# Patient Record
Sex: Female | Born: 1960 | Race: Black or African American | Hispanic: No | Marital: Married | Smoking: Never smoker
Health system: Southern US, Community
[De-identification: ages and names within clinical notes are randomized; demographics above are authoritative.]

## PROBLEM LIST (undated history)

## (undated) DIAGNOSIS — I10 Essential (primary) hypertension: Secondary | ICD-10-CM

## (undated) DIAGNOSIS — I509 Heart failure, unspecified: Secondary | ICD-10-CM

## (undated) DIAGNOSIS — J9601 Acute respiratory failure with hypoxia: Secondary | ICD-10-CM

## (undated) DIAGNOSIS — G629 Polyneuropathy, unspecified: Secondary | ICD-10-CM

## (undated) DIAGNOSIS — E785 Hyperlipidemia, unspecified: Secondary | ICD-10-CM

## (undated) DIAGNOSIS — N186 End stage renal disease: Secondary | ICD-10-CM

## (undated) DIAGNOSIS — E1122 Type 2 diabetes mellitus with diabetic chronic kidney disease: Secondary | ICD-10-CM

## (undated) DIAGNOSIS — I469 Cardiac arrest, cause unspecified: Secondary | ICD-10-CM

## (undated) HISTORY — DX: Hyperlipidemia, unspecified: E78.5

## (undated) HISTORY — PX: EYE SURGERY: SHX253

## (undated) HISTORY — DX: Acute respiratory failure with hypoxia: J96.01

## (undated) HISTORY — DX: Essential (primary) hypertension: I10

## (undated) NOTE — *Deleted (*Deleted)
FPTS Interim Progress Note  Patient name: Rachel Chaney Medical record number: [id number] Date of birth: 27-Jul-1960 Age: 68 y.o. Gender: female   S: Patient doing well. Currently receiving dialysis. Has no complaints at this time.  Overnight patient had some delirium and required Mitt placement per nurse.    O: BP (!) 135/46   Pulse 84   Temp (!) 97.5 F (36.4 C) (Oral)   Resp 14   Ht 5\' 4"  (1.626 m)   Wt 104.1 kg   LMP 09/18/2011   SpO2 100%   BMI 39.39 kg/m    Physical Exam Constitutional:      General: She is not in acute distress.    Appearance: Normal appearance. She is not ill-appearing.  HENT:     Head: Normocephalic and atraumatic.     Mouth/Throat:     Mouth: Mucous membranes are moist.  Cardiovascular:     Rate and Rhythm: Normal rate and regular rhythm.     Pulses: Normal pulses.     Heart sounds: Normal heart sounds.  Pulmonary:     Effort: Pulmonary effort is normal.     Breath sounds: Normal breath sounds.  Abdominal:     General: Abdomen is flat. There is no distension.     Palpations: Abdomen is soft.     Tenderness: There is no abdominal tenderness.  Skin:    General: Skin is warm.     Capillary Refill: Capillary refill takes less than 2 seconds.  Neurological:     Mental Status: She is alert and oriented to person, place, and time.  Psychiatric:        Mood and Affect: Mood normal.        Behavior: Behavior normal.     A/P:     Patient currently saturating well on 3L which she uses PRN at home.  No increased WOB.  O@ Sats> 95%.  Currnelty Aand O x4 and Delirium has resolved.  Patient currently clinically stable on PE.  Taking over patient from CCM this morning. - Patient seen by hospitalist as well, spoke with on phone and informed she was our patient and we are happy to continue seeing her, indicated hw would write note on her today, transferred Attending to Dr Ardelia Mems - MRI today per Neuro - Continue to hold Levemir, consider restarting  if BG's significantly increase - Continue sSSI - Continue 3L O2 - Continue to monitor vitals, Pulse o2 and mental status  Delora Fuel, MD 11/14/2019, 9:52 AM PGY-1, [facility] pager: [phone], text pages welcome

---

## 1997-04-14 ENCOUNTER — Encounter: Admission: RE | Admit: 1997-04-14 | Discharge: 1997-04-14 | Payer: Self-pay | Admitting: Sports Medicine

## 1997-04-16 ENCOUNTER — Encounter: Admission: RE | Admit: 1997-04-16 | Discharge: 1997-04-16 | Payer: Self-pay | Admitting: Family Medicine

## 1997-04-28 ENCOUNTER — Encounter: Admission: RE | Admit: 1997-04-28 | Discharge: 1997-04-28 | Payer: Self-pay | Admitting: Family Medicine

## 1997-09-29 ENCOUNTER — Encounter: Admission: RE | Admit: 1997-09-29 | Discharge: 1997-09-29 | Payer: Self-pay | Admitting: Family Medicine

## 1998-02-24 ENCOUNTER — Encounter: Admission: RE | Admit: 1998-02-24 | Discharge: 1998-02-24 | Payer: Self-pay | Admitting: Family Medicine

## 1998-09-07 ENCOUNTER — Encounter: Admission: RE | Admit: 1998-09-07 | Discharge: 1998-09-07 | Payer: Self-pay | Admitting: Sports Medicine

## 1999-02-08 ENCOUNTER — Encounter: Admission: RE | Admit: 1999-02-08 | Discharge: 1999-02-08 | Payer: Self-pay | Admitting: Sports Medicine

## 1999-02-08 ENCOUNTER — Ambulatory Visit (HOSPITAL_COMMUNITY): Admission: RE | Admit: 1999-02-08 | Discharge: 1999-02-08 | Payer: Self-pay | Admitting: Sports Medicine

## 1999-02-25 ENCOUNTER — Encounter: Admission: RE | Admit: 1999-02-25 | Discharge: 1999-02-25 | Payer: Self-pay | Admitting: Family Medicine

## 1999-07-25 ENCOUNTER — Encounter: Admission: RE | Admit: 1999-07-25 | Discharge: 1999-07-25 | Payer: Self-pay | Admitting: Family Medicine

## 1999-09-21 ENCOUNTER — Encounter: Admission: RE | Admit: 1999-09-21 | Discharge: 1999-09-21 | Payer: Self-pay | Admitting: Family Medicine

## 2000-02-20 ENCOUNTER — Encounter: Admission: RE | Admit: 2000-02-20 | Discharge: 2000-02-20 | Payer: Self-pay | Admitting: Sports Medicine

## 2000-07-26 ENCOUNTER — Encounter: Admission: RE | Admit: 2000-07-26 | Discharge: 2000-07-26 | Payer: Self-pay | Admitting: Family Medicine

## 2001-02-08 ENCOUNTER — Encounter: Admission: RE | Admit: 2001-02-08 | Discharge: 2001-02-08 | Payer: Self-pay | Admitting: Family Medicine

## 2001-06-27 ENCOUNTER — Encounter: Admission: RE | Admit: 2001-06-27 | Discharge: 2001-06-27 | Payer: Self-pay | Admitting: Family Medicine

## 2001-11-04 ENCOUNTER — Encounter: Admission: RE | Admit: 2001-11-04 | Discharge: 2001-11-04 | Payer: Self-pay | Admitting: Sports Medicine

## 2001-11-06 ENCOUNTER — Encounter: Admission: RE | Admit: 2001-11-06 | Discharge: 2001-11-06 | Payer: Self-pay | Admitting: Family Medicine

## 2002-04-04 ENCOUNTER — Encounter: Admission: RE | Admit: 2002-04-04 | Discharge: 2002-04-04 | Payer: Self-pay | Admitting: Family Medicine

## 2002-09-05 ENCOUNTER — Encounter: Admission: RE | Admit: 2002-09-05 | Discharge: 2002-09-05 | Payer: Self-pay | Admitting: Family Medicine

## 2003-03-09 ENCOUNTER — Encounter: Admission: RE | Admit: 2003-03-09 | Discharge: 2003-03-09 | Payer: Self-pay | Admitting: Family Medicine

## 2003-03-10 ENCOUNTER — Encounter: Admission: RE | Admit: 2003-03-10 | Discharge: 2003-03-10 | Payer: Self-pay | Admitting: Family Medicine

## 2003-08-31 ENCOUNTER — Encounter: Admission: RE | Admit: 2003-08-31 | Discharge: 2003-08-31 | Payer: Self-pay | Admitting: Family Medicine

## 2004-01-10 ENCOUNTER — Encounter (INDEPENDENT_AMBULATORY_CARE_PROVIDER_SITE_OTHER): Payer: Self-pay | Admitting: *Deleted

## 2004-01-10 LAB — CONVERTED CEMR LAB

## 2004-02-03 ENCOUNTER — Ambulatory Visit: Payer: Self-pay | Admitting: Family Medicine

## 2004-06-27 ENCOUNTER — Ambulatory Visit: Payer: Self-pay | Admitting: Family Medicine

## 2004-11-25 ENCOUNTER — Ambulatory Visit: Payer: Self-pay | Admitting: Family Medicine

## 2005-04-26 ENCOUNTER — Ambulatory Visit: Payer: Self-pay | Admitting: Family Medicine

## 2005-09-26 ENCOUNTER — Ambulatory Visit: Payer: Self-pay | Admitting: Sports Medicine

## 2006-02-28 ENCOUNTER — Encounter (INDEPENDENT_AMBULATORY_CARE_PROVIDER_SITE_OTHER): Payer: Self-pay | Admitting: Specialist

## 2006-02-28 ENCOUNTER — Ambulatory Visit: Payer: Self-pay | Admitting: Family Medicine

## 2006-02-28 ENCOUNTER — Encounter (INDEPENDENT_AMBULATORY_CARE_PROVIDER_SITE_OTHER): Payer: Self-pay | Admitting: Family Medicine

## 2006-02-28 LAB — CONVERTED CEMR LAB: Pap Smear: NORMAL

## 2006-03-08 ENCOUNTER — Encounter: Payer: Self-pay | Admitting: Family Medicine

## 2006-03-08 ENCOUNTER — Ambulatory Visit: Payer: Self-pay | Admitting: Family Medicine

## 2006-03-08 DIAGNOSIS — I10 Essential (primary) hypertension: Secondary | ICD-10-CM | POA: Insufficient documentation

## 2006-03-08 DIAGNOSIS — E785 Hyperlipidemia, unspecified: Secondary | ICD-10-CM | POA: Insufficient documentation

## 2006-03-08 DIAGNOSIS — J309 Allergic rhinitis, unspecified: Secondary | ICD-10-CM | POA: Insufficient documentation

## 2006-03-08 LAB — CONVERTED CEMR LAB
BUN: 14 mg/dL (ref 6–23)
CO2: 27 meq/L (ref 19–32)
Calcium: 9.2 mg/dL (ref 8.4–10.5)
Chloride: 100 meq/L (ref 96–112)
Creatinine, Ser: 0.53 mg/dL (ref 0.40–1.20)
Glucose, Bld: 313 mg/dL — ABNORMAL HIGH (ref 70–99)
Potassium: 4 meq/L (ref 3.5–5.3)
Sodium: 139 meq/L (ref 135–145)

## 2006-03-09 ENCOUNTER — Encounter (INDEPENDENT_AMBULATORY_CARE_PROVIDER_SITE_OTHER): Payer: Self-pay | Admitting: *Deleted

## 2006-04-02 ENCOUNTER — Ambulatory Visit: Payer: Self-pay | Admitting: Family Medicine

## 2006-04-02 ENCOUNTER — Encounter: Payer: Self-pay | Admitting: Family Medicine

## 2006-04-02 DIAGNOSIS — I10 Essential (primary) hypertension: Secondary | ICD-10-CM | POA: Insufficient documentation

## 2006-04-02 LAB — CONVERTED CEMR LAB
Cholesterol: 178 mg/dL (ref 0–200)
HDL: 48 mg/dL (ref >39)
LDL Cholesterol: 97 mg/dL (ref 0–99)
Total CHOL/HDL Ratio: 3.7
Triglycerides: 166 mg/dL — ABNORMAL HIGH (ref <150)
VLDL: 33 mg/dL (ref 0–40)

## 2006-05-30 ENCOUNTER — Telehealth: Payer: Self-pay | Admitting: Family Medicine

## 2006-06-05 ENCOUNTER — Telehealth (INDEPENDENT_AMBULATORY_CARE_PROVIDER_SITE_OTHER): Payer: Self-pay | Admitting: Family Medicine

## 2006-06-08 ENCOUNTER — Telehealth: Payer: Self-pay | Admitting: *Deleted

## 2006-07-02 ENCOUNTER — Ambulatory Visit: Payer: Self-pay | Admitting: Sports Medicine

## 2006-07-02 LAB — CONVERTED CEMR LAB
Hgb A1c MFr Bld: 10.8 %
Microalbumin U total vol: POSITIVE mg/L

## 2006-08-01 ENCOUNTER — Telehealth: Payer: Self-pay | Admitting: *Deleted

## 2006-08-01 ENCOUNTER — Encounter: Payer: Self-pay | Admitting: *Deleted

## 2006-09-05 ENCOUNTER — Telehealth (INDEPENDENT_AMBULATORY_CARE_PROVIDER_SITE_OTHER): Payer: Self-pay | Admitting: Family Medicine

## 2006-10-23 ENCOUNTER — Telehealth (INDEPENDENT_AMBULATORY_CARE_PROVIDER_SITE_OTHER): Payer: Self-pay | Admitting: Family Medicine

## 2006-10-23 ENCOUNTER — Encounter (INDEPENDENT_AMBULATORY_CARE_PROVIDER_SITE_OTHER): Payer: Self-pay | Admitting: Family Medicine

## 2006-11-06 ENCOUNTER — Telehealth (INDEPENDENT_AMBULATORY_CARE_PROVIDER_SITE_OTHER): Payer: Self-pay | Admitting: Family Medicine

## 2006-11-16 ENCOUNTER — Ambulatory Visit: Payer: Self-pay | Admitting: Family Medicine

## 2006-11-16 DIAGNOSIS — IMO0002 Reserved for concepts with insufficient information to code with codable children: Secondary | ICD-10-CM | POA: Insufficient documentation

## 2006-11-16 LAB — CONVERTED CEMR LAB: Hgb A1c MFr Bld: 10.8 %

## 2006-12-18 ENCOUNTER — Telehealth: Payer: Self-pay | Admitting: *Deleted

## 2007-03-05 ENCOUNTER — Telehealth: Payer: Self-pay | Admitting: *Deleted

## 2007-04-17 ENCOUNTER — Encounter (INDEPENDENT_AMBULATORY_CARE_PROVIDER_SITE_OTHER): Payer: Self-pay | Admitting: Family Medicine

## 2007-04-17 ENCOUNTER — Ambulatory Visit: Payer: Self-pay | Admitting: Family Medicine

## 2007-04-17 LAB — CONVERTED CEMR LAB
BUN: 13 mg/dL (ref 6–23)
CO2: 22 meq/L (ref 19–32)
Calcium: 9.1 mg/dL (ref 8.4–10.5)
Chloride: 105 meq/L (ref 96–112)
Creatinine, Ser: 0.53 mg/dL (ref 0.40–1.20)
Glucose, Bld: 237 mg/dL — ABNORMAL HIGH (ref 70–99)
Hgb A1c MFr Bld: 11.6 %
Potassium: 3.8 meq/L (ref 3.5–5.3)
Sodium: 140 meq/L (ref 135–145)

## 2007-04-19 ENCOUNTER — Encounter (INDEPENDENT_AMBULATORY_CARE_PROVIDER_SITE_OTHER): Payer: Self-pay | Admitting: Family Medicine

## 2007-07-09 ENCOUNTER — Ambulatory Visit: Payer: Self-pay | Admitting: Family Medicine

## 2007-07-09 ENCOUNTER — Encounter (INDEPENDENT_AMBULATORY_CARE_PROVIDER_SITE_OTHER): Payer: Self-pay | Admitting: Family Medicine

## 2007-07-09 DIAGNOSIS — N898 Other specified noninflammatory disorders of vagina: Secondary | ICD-10-CM | POA: Insufficient documentation

## 2007-07-09 LAB — CONVERTED CEMR LAB
Beta hcg, urine, semiquantitative: NEGATIVE
Hgb A1c MFr Bld: 11.5 %

## 2007-07-11 LAB — CONVERTED CEMR LAB: FSH: 17 milliintl units/mL

## 2007-07-24 ENCOUNTER — Telehealth: Payer: Self-pay | Admitting: *Deleted

## 2007-08-28 ENCOUNTER — Encounter (INDEPENDENT_AMBULATORY_CARE_PROVIDER_SITE_OTHER): Payer: Self-pay | Admitting: Family Medicine

## 2007-10-15 ENCOUNTER — Telehealth: Payer: Self-pay | Admitting: *Deleted

## 2007-11-11 ENCOUNTER — Ambulatory Visit: Payer: Self-pay | Admitting: Family Medicine

## 2007-11-11 ENCOUNTER — Encounter (INDEPENDENT_AMBULATORY_CARE_PROVIDER_SITE_OTHER): Payer: Self-pay | Admitting: Family Medicine

## 2007-11-11 LAB — CONVERTED CEMR LAB
Beta hcg, urine, semiquantitative: NEGATIVE
Hgb A1c MFr Bld: 11.8 %

## 2007-11-20 ENCOUNTER — Encounter (INDEPENDENT_AMBULATORY_CARE_PROVIDER_SITE_OTHER): Payer: Self-pay | Admitting: Family Medicine

## 2007-11-20 LAB — CONVERTED CEMR LAB
ALT: 11 units/L (ref 0–35)
AST: 11 units/L (ref 0–37)
Albumin: 3.6 g/dL (ref 3.5–5.2)
Alkaline Phosphatase: 78 units/L (ref 39–117)
BUN: 13 mg/dL (ref 6–23)
CO2: 22 meq/L (ref 19–32)
Calcium: 9 mg/dL (ref 8.4–10.5)
Chloride: 103 meq/L (ref 96–112)
Cholesterol: 192 mg/dL (ref 0–200)
Creatinine, Ser: 0.48 mg/dL (ref 0.40–1.20)
Glucose, Bld: 239 mg/dL — ABNORMAL HIGH (ref 70–99)
HDL: 41 mg/dL (ref >39)
LDL Cholesterol: 125 mg/dL — ABNORMAL HIGH (ref 0–99)
Potassium: 3.6 meq/L (ref 3.5–5.3)
Sodium: 137 meq/L (ref 135–145)
TSH: 0.913 microintl units/mL (ref 0.350–4.50)
Total Bilirubin: 0.5 mg/dL (ref 0.3–1.2)
Total CHOL/HDL Ratio: 4.7
Total Protein: 6.4 g/dL (ref 6.0–8.3)
Triglycerides: 131 mg/dL (ref <150)
VLDL: 26 mg/dL (ref 0–40)

## 2007-11-21 ENCOUNTER — Telehealth (INDEPENDENT_AMBULATORY_CARE_PROVIDER_SITE_OTHER): Payer: Self-pay | Admitting: Family Medicine

## 2007-12-17 ENCOUNTER — Telehealth: Payer: Self-pay | Admitting: *Deleted

## 2007-12-25 ENCOUNTER — Telehealth: Payer: Self-pay | Admitting: *Deleted

## 2008-01-17 ENCOUNTER — Telehealth: Payer: Self-pay | Admitting: *Deleted

## 2008-01-30 ENCOUNTER — Ambulatory Visit: Payer: Self-pay | Admitting: Family Medicine

## 2008-01-31 ENCOUNTER — Ambulatory Visit: Payer: Self-pay | Admitting: Family Medicine

## 2008-02-03 ENCOUNTER — Ambulatory Visit: Payer: Self-pay | Admitting: Family Medicine

## 2008-03-25 ENCOUNTER — Telehealth (INDEPENDENT_AMBULATORY_CARE_PROVIDER_SITE_OTHER): Payer: Self-pay | Admitting: Family Medicine

## 2008-05-13 ENCOUNTER — Telehealth: Payer: Self-pay | Admitting: *Deleted

## 2008-05-26 ENCOUNTER — Ambulatory Visit: Payer: Self-pay | Admitting: Family Medicine

## 2008-05-26 ENCOUNTER — Encounter (INDEPENDENT_AMBULATORY_CARE_PROVIDER_SITE_OTHER): Payer: Self-pay | Admitting: Family Medicine

## 2008-05-26 LAB — CONVERTED CEMR LAB
Hgb A1c MFr Bld: 12.8 %
Pap Smear: NORMAL

## 2008-05-27 LAB — CONVERTED CEMR LAB: Direct LDL: 125 mg/dL — ABNORMAL HIGH

## 2008-05-28 ENCOUNTER — Encounter (INDEPENDENT_AMBULATORY_CARE_PROVIDER_SITE_OTHER): Payer: Self-pay | Admitting: Family Medicine

## 2008-05-29 ENCOUNTER — Ambulatory Visit (HOSPITAL_COMMUNITY): Admission: RE | Admit: 2008-05-29 | Discharge: 2008-05-29 | Payer: Self-pay | Admitting: Family Medicine

## 2008-10-05 ENCOUNTER — Ambulatory Visit: Payer: Self-pay | Admitting: Family Medicine

## 2008-10-05 LAB — CONVERTED CEMR LAB: Hgb A1c MFr Bld: 10.9 %

## 2008-10-16 ENCOUNTER — Telehealth: Payer: Self-pay | Admitting: Family Medicine

## 2008-11-20 ENCOUNTER — Ambulatory Visit: Payer: Self-pay | Admitting: Family Medicine

## 2009-01-13 ENCOUNTER — Telehealth: Payer: Self-pay | Admitting: Family Medicine

## 2009-03-01 ENCOUNTER — Telehealth (INDEPENDENT_AMBULATORY_CARE_PROVIDER_SITE_OTHER): Payer: Self-pay | Admitting: Pharmacist

## 2009-04-22 ENCOUNTER — Encounter: Payer: Self-pay | Admitting: Family Medicine

## 2009-04-22 ENCOUNTER — Ambulatory Visit: Payer: Self-pay | Admitting: Family Medicine

## 2009-04-22 DIAGNOSIS — N924 Excessive bleeding in the premenopausal period: Secondary | ICD-10-CM | POA: Insufficient documentation

## 2009-04-22 DIAGNOSIS — L293 Anogenital pruritus, unspecified: Secondary | ICD-10-CM | POA: Insufficient documentation

## 2009-04-22 LAB — CONVERTED CEMR LAB
ALT: 12 units/L (ref 0–35)
AST: 13 units/L (ref 0–37)
Albumin: 3.6 g/dL (ref 3.5–5.2)
Alkaline Phosphatase: 87 units/L (ref 39–117)
BUN: 9 mg/dL (ref 6–23)
Beta hcg, urine, semiquantitative: NEGATIVE
CO2: 23 meq/L (ref 19–32)
Calcium: 9.2 mg/dL (ref 8.4–10.5)
Chloride: 104 meq/L (ref 96–112)
Creatinine, Ser: 0.56 mg/dL (ref 0.40–1.20)
Glucose, Bld: 197 mg/dL — ABNORMAL HIGH (ref 70–99)
Hgb A1c MFr Bld: 11 %
Potassium: 3.7 meq/L (ref 3.5–5.3)
Sodium: 138 meq/L (ref 135–145)
Total Bilirubin: 0.3 mg/dL (ref 0.3–1.2)
Total Protein: 6.5 g/dL (ref 6.0–8.3)
Whiff Test: NEGATIVE

## 2009-04-26 ENCOUNTER — Encounter: Payer: Self-pay | Admitting: Family Medicine

## 2009-05-03 ENCOUNTER — Telehealth (INDEPENDENT_AMBULATORY_CARE_PROVIDER_SITE_OTHER): Payer: Self-pay | Admitting: *Deleted

## 2009-05-17 ENCOUNTER — Telehealth: Payer: Self-pay | Admitting: Family Medicine

## 2009-05-21 ENCOUNTER — Ambulatory Visit: Payer: Self-pay | Admitting: Family Medicine

## 2009-05-21 ENCOUNTER — Telehealth: Payer: Self-pay | Admitting: Family Medicine

## 2009-05-21 ENCOUNTER — Encounter: Payer: Self-pay | Admitting: Family Medicine

## 2009-05-21 DIAGNOSIS — N764 Abscess of vulva: Secondary | ICD-10-CM | POA: Insufficient documentation

## 2009-06-10 ENCOUNTER — Telehealth: Payer: Self-pay | Admitting: Family Medicine

## 2009-09-20 ENCOUNTER — Telehealth: Payer: Self-pay | Admitting: *Deleted

## 2009-10-11 ENCOUNTER — Ambulatory Visit: Payer: Self-pay | Admitting: Family Medicine

## 2009-10-11 LAB — CONVERTED CEMR LAB
Hgb A1c MFr Bld: 11.6 %
Whiff Test: NEGATIVE

## 2010-02-08 NOTE — Progress Notes (Signed)
Summary: rx req  Phone Note Call from Patient Call back at Home Phone [phone] Call back at Work Phone [phone]   Caller: Patient Summary of Call: wants to know if she can get one sample of insulin pen until she can get some medicine. Initial call taken by: Raymond Gurney,  May 13, 2008 8:49 AM  Follow-up for Phone Call        will give one sample. Lantus Solostar [employer] lot  # [id number]  exp date 08/2010. patient notified to pick up. Follow-up by: Marcell Barlow RN,  May 13, 2008 9:10 AM

## 2010-02-08 NOTE — Assessment & Plan Note (Signed)
Summary: TB ,df  Nurse Visit    Prior Medications: BAYER CHILDRENS ASPIRIN 81 MG CHEW (ASPIRIN) Take 1 tablet by mouth once a day GLUCOTROL XL 10 MG TB24 (GLIPIZIDE) Take 1 tablet by mouth twice a day ONETOUCH ULTRA TEST  STRP (GLUCOSE BLOOD) 1 strip every morning ZYRTEC ALLERGY 10 MG TABS (CETIRIZINE HCL) 1 tablet by mouth at bedtime as needed - not taking much METOPROLOL TARTRATE 100 MG  TABS (METOPROLOL TARTRATE) Take one tablet two times a day VASOTEC 20 MG  TABS (ENALAPRIL MALEATE) Take 2 tablets once a day HYDROCHLOROTHIAZIDE 25 MG  TABS (HYDROCHLOROTHIAZIDE) Take one tablet daily. NORVASC 10 MG  TABS (AMLODIPINE BESYLATE) Take one tablet daily NEURONTIN 300 MG  CAPS (GABAPENTIN) Take one tablet by mouth at bedtime for feet pain Current Allergies: No known allergies    PPD Application    Vaccine Type: PPD    Site: left forearm    Mfr: [place]    Dose: 0.1 ml    Route: ID    Given by: AMY MARTIN RN    Exp. Date: 12/26/2009    Lot #: [id number]   Orders Added: 1)  TB Skin Test [86580] 2)  Est Level 1- [facility] [id number]    ]

## 2010-02-08 NOTE — Progress Notes (Signed)
Summary: refill  Phone Note Call from Patient Call back at Home Phone [phone]   Refills Requested: Medication #1:  ONETOUCH ULTRA TEST  STRP 1 strip every morning Caller: Patient Summary of Call: needs ultra touch pen needles ultra touch test strips [facility] [address] Initial call taken by: Audie Clear,  March 25, 2008 9:01 AM  Follow-up for Phone Call        will forward message to MD. Follow-up by: Marcell Barlow RN,  March 25, 2008 9:02 AM  Additional Follow-up for Phone Call Additional follow up Details #1::        Refilled.  Additional Follow-up by: Danise Mina MD,  March 26, 2008 11:24 AM    New/Updated Medications: * ULTRA TOUCH PEN NEEDLES DM: 250.00 Check sugar daily in AM.  Dispense one month supply   Prescriptions: ULTRA TOUCH PEN NEEDLES DM: 250.00 Check sugar daily in AM.  Dispense one month supply  #1 x 11   Entered and Authorized by:   Danise Mina MD   Signed by:   Danise Mina MD on 03/26/2008   Method used:   Faxed to ...       [facility] [phone]* (retail)       [place], [place]  [address]       Ph: [phone]       Fax: [phone]   RxID:   [id number] Donald Siva TEST  STRP (GLUCOSE BLOOD) 1 strip every morning  #100 x prn   Entered and Authorized by:   Danise Mina MD   Signed by:   Danise Mina MD on 03/26/2008   Method used:   Electronically to        [facility] [phone]* (retail)       [address] [place], [place]  [address]       Ph: [phone]       Fax: [phone]   RxID:   [id number]

## 2010-02-08 NOTE — Letter (Signed)
Summary: Generic Letter  [facility]  [address]   [place], [place] [address]   Phone: [phone]  Fax: [phone]    05/28/2008  Rachel Chaney [address] [place], [place]  [address]  Dear Ms. Eakins,  Your pap smear done 05/26/08 was normal. You are not due for another pap smear for 3 years. Please call our office if you have any questions.   Sincerely,   Danise Mina MD  Appended Document: Generic Letter mailed

## 2010-02-08 NOTE — Assessment & Plan Note (Signed)
Summary: dm wp   Vital Signs:  Patient Profile:   50 Years Old Female Height:     64 inches Weight:      193.8 pounds BMI:     33.39 Temp:     99 degrees F Pulse rate:   95 / minute BP sitting:   168 / 82  Pt. in pain?   no  Vitals Entered By: Elige Radon RN (November 16, 2006 1:41 PM)              Is Patient Diabetic? Yes      PCP:  Randa Spike MD  Chief Complaint:  diabetes check.  History of Present Illness: 50 yo with HTN, DM, obesity here for routine follow up.  1. DM - Denies hypoglycemic episodes. Taking Lantus 40 units q hs and Glucotrol XL 10 mg daily and says she does not miss doses. Fasting blood sugars are 150-160s. Two hour postprandials are 170-180s. No polyphagia, polydipsia, polyuria. Saw eye doctor in April.   2. HTN - No CP,SOB, vison changes, HAs, edema. Says she has been compliant with her meds.  3. Obesity - Not exercising. She is going to try to walk 2 times a week. Has  been trying to eat baked foods and veggies and fruits. was seeing Dr. Jenne Campus but not now.   4. Pain in legs and back - Seemed to get worse 3 weeks ago when started having to move around more at work.  Sometimes has numbness in feet and hands but none right now. This is mainly in the mornings. Has seen a food doctor for this.   Current Allergies (reviewed today): No known allergies   Past Medical History:    Diabetes    HTN    Obesity  Past Surgical History:    No surgeries   Family History:    Diabetes 1st degree - father and grandfather    No colon cancer or female cancer.    No CAD or CVAs.   Social History:    Has never smokes, No ETOH, Drug.      Physical Exam  General:     alert, well-developed, and well-nourished.   Head:     normocephalic and atraumatic.   Neck:     No carotid bruits. Lungs:     normal respiratory effort, normal breath sounds, no crackles, and no wheezes.   Heart:     normal rate, regular rhythm, no murmur, no gallop, and  no rub.   Abdomen:     soft, non-tender, normal bowel sounds, no distention, no masses, no guarding, no hepatomegaly, and no splenomegaly.  obese. Msk:     Tender in lumbar paraspinus region but no tenderness over spine.  Pulses:     R and L dorsalis pedis normal.   Extremities:     no edema. Normal sensation feet and legs.  Neurologic:     sensation intact to light touch.   Psych:     normally interactive.      Impression & Recommendations:  Problem # 1:  DM W/MANIFESTATION NEC, TYPE II, UNCONTROLLED (ICD-250.82) Assessment: Unchanged Increase Lantus to 42 units and will have patient titrate up on her own to keep fasting sugars <120. Samples of solostar pen given x 2. [id number], Exp 2/11. Continue Glucotrol XL. RTC in 1 month. AIC is 10.8.    The following medications were removed from the medication list:    Enalapril-hydrochlorothiazide 10-25 Mg Tabs (Enalapril-hydrochlorothiazide) .Marland Kitchen... Take 2 tablet by  mouth once a day  Her updated medication list for this problem includes:    Bayer Childrens Aspirin 81 Mg Chew (Aspirin) .Marland Kitchen... Take 1 tablet by mouth once a day    Glucotrol Xl 10 Mg Tb24 (Glipizide) .Marland Kitchen... Take 1 tablet by mouth twice a day    Vasotec 20 Mg Tabs (Enalapril maleate) .Marland Kitchen... Take 2 tablets once a day    Lantus Solostar 100 Unit/ml Soln (Insulin glargine) ..... Inject 42 units daily and increase by 1 unit every 2 days as long as your fasting blood sugar is greater than 120. disp 1 month supply  Orders: [facility]- Est  Level 4 [id number])   Problem # 2:  HYPERTENSION, BENIGN SYSTEMIC (ICD-401.1) Assessment: Unchanged Blood pressure still elevated. Will stop her combination medication and have her double the Enalapril component to 40 mg daily and have her only take HCTZ 25 mg daily. She will need to have her electrolytes checked in 1-2 weeks since we are increasing her enalapril.   The following medications were removed from the medication list:     Enalapril-hydrochlorothiazide 10-25 Mg Tabs (Enalapril-hydrochlorothiazide) .Marland Kitchen... Take 2 tablet by mouth once a day  Her updated medication list for this problem includes:    Metoprolol Tartrate 100 Mg Tabs (Metoprolol tartrate) .Marland Kitchen... Take one tablet two times a day    Vasotec 20 Mg Tabs (Enalapril maleate) .Marland Kitchen... Take 2 tablets once a day    Hydrochlorothiazide 25 Mg Tabs (Hydrochlorothiazide) .Marland Kitchen... Take one tablet daily.  Orders: [facility]- Est  Level 4 [id number])  Future Orders: [facility] [id number]) ... 12/07/2006   Problem # 3:  OBESITY, NOS (ICD-278.00) Assessment: Deteriorated Weight gain of 2-3 lbs. Advised weight loss, exercise, and diet again.  Orders: [facility]- Est  Level 4 [id number])   Problem # 4:  BACK STRAIN ([id number]) Assessment: New Advised her to use Ibuprofen and ice packs and see if the pain goes away in the next few weeks. Likely is a muscle strain from her new job.  Orders: [facility]- Est  Level 4 [id number])   Complete Medication List: 1)  Bayer Childrens Aspirin 81 Mg Chew (Aspirin) .... Take 1 tablet by mouth once a day 2)  Enpresse-28 Tabs (Levonorg-eth estrad triphasic) .... Take 1 tablet by mouth as directed 3)  Glucotrol Xl 10 Mg Tb24 (Glipizide) .... Take 1 tablet by mouth twice a day 4)  Onetouch Ultra Test Strp (Glucose blood) .Marland Kitchen.. 1 strip every morning 5)  Zyrtec Allergy 10 Mg Tabs (Cetirizine hcl) .Marland Kitchen.. 1 tablet by mouth at bedtime as needed - not taking much 6)  Metoprolol Tartrate 100 Mg Tabs (Metoprolol tartrate) .... Take one tablet two times a day 7)  Vasotec 20 Mg Tabs (Enalapril maleate) .... Take 2 tablets once a day 8)  Hydrochlorothiazide 25 Mg Tabs (Hydrochlorothiazide) .... Take one tablet daily. 9)  Lantus Solostar 100 Unit/ml Soln (Insulin glargine) .... Inject 42 units daily and increase by 1 unit every 2 days as long as your fasting blood sugar is greater than 120. disp 1 month supply  Other Orders: A1C-[facility] [id number])   Patient Instructions: 1)  We  will check your electrolytes and kidneys in 1-2 weeks because we are increasing your Enalapril.  2)  Increase your Lantus to 42 units daily. Then increase every 2 days by 1 unit as long as your fasting blood sugars are >120. 3)  I am going to increase your enalapril and decrease your HCTZ for your blood pressure. 4)  You will be taking Enalapril  20 mg two tablets daily and HCTZ 25 mg daily. 5)  Try to exercise 2-3 times a week. Continue eating fruits and vegetables and avoiding fried foods and sweets. 6)  Please schedule a follow-up appointment in 1 month.    Prescriptions: LANTUS SOLOSTAR 100 UNIT/ML  SOLN (INSULIN GLARGINE) Inject 42 units daily and increase by 1 unit every 2 days as long as your fasting blood sugar is greater than 120. Disp 1 month supply  #1 x 1   Entered and Authorized by:   Rolly Salter MD   Signed by:   Rolly Salter MD on 11/16/2006   Method used:   Electronically sent to ...       [facility]       [place], [place]  [address]       Ph: [phone]       Fax: [phone]   RxID:   [id number] HYDROCHLOROTHIAZIDE 25 MG  TABS (HYDROCHLOROTHIAZIDE) Take one tablet daily.  #30 x 1   Entered and Authorized by:   Rolly Salter MD   Signed by:   Rolly Salter MD on 11/16/2006   Method used:   Electronically sent to ...       [place], [place]  [address]       Ph: [phone]       Fax: [phone]   RxID:   [id number] VASOTEC 20 MG  TABS (ENALAPRIL MALEATE) Take 2 tablets once a day  #60 x 1   Entered and Authorized by:   Rolly Salter MD   Signed by:   Rolly Salter MD on 11/16/2006   Method used:   Electronically sent to ...       [facility]       [place], [place]  [address]       Ph: [phone]       Fax: [phone]   RxID:   [id number]  ]  Laboratory Results   Blood Tests   Date/Time Received: November 16, 2006 1:47 PM  Date/Time Reported: November 16, 2006 2:00 PM   HGBA1C: 10.8%   (Normal Range: Non-Diabetic - 3-6%   Control Diabetic - 6-8%)  Comments: ...................................................................DONNA LORING  November 16, 2006 2:00 PM

## 2010-02-08 NOTE — Assessment & Plan Note (Signed)
Summary: READ TB/EO  Nurse Visit    Prior Medications: BAYER CHILDRENS ASPIRIN 81 MG CHEW (ASPIRIN) Take 1 tablet by mouth once a day GLUCOTROL XL 10 MG TB24 (GLIPIZIDE) Take 1 tablet by mouth twice a day ONETOUCH ULTRA TEST  STRP (GLUCOSE BLOOD) 1 strip every morning ZYRTEC ALLERGY 10 MG TABS (CETIRIZINE HCL) 1 tablet by mouth at bedtime as needed - not taking much METOPROLOL TARTRATE 100 MG  TABS (METOPROLOL TARTRATE) Take one tablet two times a day VASOTEC 20 MG  TABS (ENALAPRIL MALEATE) Take 2 tablets once a day HYDROCHLOROTHIAZIDE 25 MG  TABS (HYDROCHLOROTHIAZIDE) Take one tablet daily. LANTUS SOLOSTAR 100 UNIT/ML  SOLN (INSULIN GLARGINE) Inject 55 units daily and increase by 1 unit every day as long as your fasting blood sugar is greater than 100. Disp 1 month supply NORVASC 10 MG  TABS (AMLODIPINE BESYLATE) Take one tablet daily NEURONTIN 300 MG  CAPS (GABAPENTIN) Take one tablet by mouth at bedtime for feet pain ORTHO MICRONOR 0.35 MG TABS (NORETHINDRONE (CONTRACEPTIVE)) One tablet by mouth daily at the same time every day Current Allergies: No known allergies    PPD Results    Date of reading: 02/03/2008    Results: < 71mm    Interpretation: negative   Orders Added: 1)  No Charge Patient Arrived (NCPA0) [NCPA0]    ]

## 2010-02-08 NOTE — Progress Notes (Signed)
Summary: requesting refill  Medications Added BAYER CHILDRENS ASPIRIN 81 MG CHEW (ASPIRIN) Take 1 tablet by mouth once a day ENALAPRIL-HYDROCHLOROTHIAZIDE 10-25 MG TABS (ENALAPRIL-HYDROCHLOROTHIAZIDE) Take 2 tablet by mouth once a day ENPRESSE-28  TABS (LEVONORG-ETH ESTRAD TRIPHASIC) Take 1 tablet by mouth as directed GLUCOTROL XL 10 MG TB24 (GLIPIZIDE) Take 1 tablet by mouth twice a day ONETOUCH ULTRA TEST  STRP (GLUCOSE BLOOD) 1 strip every morning ZYRTEC ALLERGY 10 MG TABS (CETIRIZINE HCL) 1 tablet by mouth at bedtime       Phone Note Call from Patient Call back at [phone]   Reason for Call: Refill Medication Summary of Call: pt is requesting an rx for birth control for trivora, pt is out of refills, pt goes to [facility] pharmacy Initial call taken by: ERIN LEVAN,  October 23, 2006 2:27 PM  Follow-up for Phone Call        Refilled medication. Sent to [facility] on [address]. Please advise patient that she will need a pap smear in March of 2009.  Follow-up by: Rolly Salter MD,  October 23, 2006 4:41 PM  Additional Follow-up for Phone Call Additional follow up Details #1::        Pt informed.  She sts that MD told her that she does not need a pap for 3 more years.  Advised they could discuss that at her appt on 11.7.08.  Pt agreeable Additional Follow-up by: JESSICA FLEEGER CMA,  October 23, 2006 4:49 PM    New/Updated Medications: BAYER CHILDRENS ASPIRIN 81 MG CHEW (ASPIRIN) Take 1 tablet by mouth once a day ENALAPRIL-HYDROCHLOROTHIAZIDE 10-25 MG TABS (ENALAPRIL-HYDROCHLOROTHIAZIDE) Take 2 tablet by mouth once a day ENPRESSE-28  TABS (LEVONORG-ETH ESTRAD TRIPHASIC) Take 1 tablet by mouth as directed GLUCOTROL XL 10 MG TB24 (GLIPIZIDE) Take 1 tablet by mouth twice a day ONETOUCH ULTRA TEST  STRP (GLUCOSE BLOOD) 1 strip every morning ZYRTEC ALLERGY 10 MG TABS (CETIRIZINE HCL) 1 tablet by mouth at bedtime   Prescriptions: ENPRESSE-28  TABS (LEVONORG-ETH ESTRAD TRIPHASIC) Take 1  tablet by mouth as directed  #28 x 6   Entered and Authorized by:   Rolly Salter MD   Signed by:   Rolly Salter MD on 10/23/2006   Method used:   Electronically sent to ...       [facility]       [address]       [place], [place]  [address]       Ph: [phone]       Fax: [phone]   RxID:   [id number]      Preventive Care Screening  Pap Smear:    Date:  02/28/2006    Next Due:  03/2007    Results:  normal   Preventive Care Screening  Pap Smear:    Date:  02/28/2006    Next Due:  03/2007    Results:  normal

## 2010-02-08 NOTE — Assessment & Plan Note (Signed)
Summary: diabetes wk   Vital Signs:  Patient Profile:   50 Years Old Female Height:     64 inches Weight:      190.9 pounds BMI:     32.89 Temp:     98.7 degrees F Pulse rate:   82 / minute BP sitting:   159 / 79  Pt. in pain?   no  Vitals Entered By: Elige Radon RN (July 02, 2006 8:40 AM)              Is Patient Diabetic? Yes    PCP:  Randa Spike MD  Chief Complaint:  diabetes check.  History of Present Illness: Ms. Mcnemar comes today for Diabetes follow up reports feeling well today her issues as we discussed in this visit are as follow:  1)DM: States is much more comfortable for her to use the solostar pen but has to pay 193$ out of pocket for her prescription as is just partially covered by her insurance. Currently using 37units at bedtime plus glucotrol xl 10mg  two times a day. Denies hypoglycemic events. Reports compliance with medications but admits to still having poor diabetic diet and understands she need to start doing excercise. Has seen Dr. Jenne Campus her advise has helped but is still difficult to modify her eating habits. Her weight is stable at 191lbs. in the last 4-5 months. Her CBGs AM fasting last week have ranged from 140-150.  Takes asa inconsistently. Her HgA1C is 10.8 today from 12.0  2)HTN: Not well controled. Patient reluctant to add a new agent to her regime. She denies HA, CP, SOB, PND, visual changes,  or leg sweeling. Tells me he BP is fine when she checks at the pharmacy. Is 159/79 tody. She takes enalapril/HCTZ 10/25mg  2 pills daily and metoprolol 100mg  two times a day.          Physical Exam  General:     Well-developed, mild obesity, in no acute distress; alert,appropriate and cooperative throughout examination Eyes:     EOMI. Perrla. Limited funduscopic exam benign, without hemorrhages, exudates or papilledema. Vision grossly normal. Mouth:     Oral mucosa and oropharynx without lesions or exudates.  Teeth in good repair.  Neck:     No deformities, masses, or tenderness noted. Lungs:     Lungs are clear to auscultation, no crackles or wheezes. Heart:     Normal rate and regular rhythm. S1 and S2 normal without gallop, murmur, click, rub or other extra sounds. Pulses:     ,dorsalis pedis full and equal bilaterally Neurologic:     alert & oriented X3 and sensation intact to light touch and monofilament test in low extremities.    Impression & Recommendations:  Problem # 1:  DIABETES MELLITUS II, UNCOMPLICATED (XX123456) Assessment: Improved Improved but still far from goal. Increased Lantus to 40 units at bedtime. She needs to work on diet and exercise. Follow up in 3 months Orders: [facility] Microalbumin-[facility] ([id number]) A1C-[facility] [id number]) [facility]- Est Level  3 [id number])   Problem # 2:  HYPERTENSION, BENIGN SYSTEMIC (ICD-401.1) Assessment: Deteriorated Poor controled.  Patient reluctant to adding a medication. Explained the risks of poorly controled HTN includng but not exclusve to MI and stroke. Consider adding  CCB (Norvasc) or incresing her ACE dose.  encouraged weight reduction. return in 6 weeks for BP f/u   Orders: [facility]- Est Level  3 [id number])   Diabetes Management Assessment/Plan:      The following lipid goals have been established for the patient:  Total cholesterol goal of 200; LDL cholesterol goal of 100; HDL cholesterol goal of 40; Triglyceride goal of 200.     Patient Instructions: 1)  Your diabetes is improving but still not well controled.  2)  You need to increase your Lantus to 40units every night. (remember to take a late night snack) 3)  Your blood pressure also also not well controled today. Per your request I'm not adding a medication today. If you keep working in your diet and loose weight this could help with your blood pressure as we discussed. But if on next visit your blood pressure is still high we will need to add another med to your regime. 4)  Continue to take your daily aspirin and  the rest of your medications. 5)  Return in 6 weeks for BP follow up and meet your new doctor.  Laboratory Results   Urine Tests  Date/Time Recieved: July 02, 2006 8:49 AM Date/Time Reported: July 02, 2006 9:02 AM Microalbumin (urine): positive (trace) mg/L    Comments: ...................................................................[place],  July 02, 2006 9:03 AM   Blood Tests   Date/Time Recieved: July 02, 2006 8:49 AM Date/Time Reported: July 02, 2006 9:01 AM  HGBA1C: 10.8%   (Normal Range: Non-Diabetic - 3-6%   Control Diabetic - 6-8%)  CBC Comments: ...................................................................[place],  July 02, 2006 9:02 AM

## 2010-02-08 NOTE — Progress Notes (Signed)
Summary: meds  Phone Note Call from Patient Call back at Work Phone [phone]   Caller: Patient Summary of Call: pt is requesting to have Ibuprofen called in for the pain- [facility] Initial call taken by: Audie Clear,  May 21, 2009 10:37 AM  Follow-up for Phone Call        will do.  Follow-up by: Patria Mane  MD,  May 21, 2009 10:38 AM

## 2010-02-08 NOTE — Progress Notes (Signed)
Summary: Needs refill on birth control  Phone Note Call from Patient Call back at [phone]   Summary of Call: pt is wanting [place] to call in birth control to [place] Initial call taken by: Drucie Ip,  May 30, 2006 9:05 AM  Follow-up for Phone Call        is checking status Follow-up by: Drucie Ip,  May 31, 2006 8:51 AM  Additional Follow-up for Phone Call Additional follow up Details #1::        please ask which birth control pill and dose she needs or place chart in my box as I don't have it in Dr. Brantley Stage. Thanks, Additional Follow-up by: Randa Spike MD,  May 31, 2006 8:53 AM

## 2010-02-08 NOTE — Letter (Signed)
Summary: Generic Letter  [facility]  [address]   [place], [place] [address]   Phone: [phone]  Fax: [phone]    08/28/2007  Rachel Chaney [address] [place], [place]  [address]  Dear [name],  Please make an appointment with [place] for your diabetes. I want to make sure you are doing well since your numbers showed your diabetes is not very well controlled at your last visit. Call [phone] to schedule an appointment.   Sincerely,   Saifullah Jolley HOBBS MD [facility]  Appended Document: Generic Letter sent

## 2010-02-08 NOTE — Progress Notes (Signed)
Summary: Insulin samples  Phone Note Call from Patient Call back at [facility] Phone [phone]   Summary of Call: requesting insulin samples if we have them. Initial call taken by: Drucie Ip,  December 25, 2007 8:52 AM  Follow-up for Phone Call        Pt informed that we are phasing out of giving samples out anymore.  Pt expressed understanding Follow-up by: [facility] CMA,  December 25, 2007 12:10 PM

## 2010-02-08 NOTE — Progress Notes (Signed)
Summary: Birth control  Phone Note Call from Patient Call back at [phone]   Summary of Call: pt states she is needing a 90 day supply of her birthcontrol called into mail order Initial call taken by: Drucie Ip,  Jun 05, 2006 9:09 AM  Follow-up for Phone Call        pt is calling to check status of her refill on birth control, [facility] [phone] Follow-up by: Agnes Lawrence,  Jun 05, 2006 3:09 PM  Additional Follow-up for Phone Call Additional follow up Details #1::        checking status Additional Follow-up by: Drucie Ip,  Jun 06, 2006 9:15 AM   Additional Follow-up for Phone Call Additional follow up Details #2::    eRx'd birth control to [facility] Follow-up by: Lady Deutscher MD,  Jun 06, 2006 9:26 AM

## 2010-02-08 NOTE — Letter (Signed)
Summary: Generic Letter  [facility]  [address]   [place], [place] [address]   Phone: [phone]  Fax: [phone]    04/19/2007  Rachel Chaney [address] [place], [place]  [address]  Dear [name],  Your lab results from 04/17/07 were all normal except that your hemoglobin AIC is 11.6 which is high and your glucose was around 200. Please make sure you increase your lantus daily by 1 unit every time your fasting blood sugar is >100. This should help lower your blood sugars.  Sincerely,   Shonika Kolasinski HOBBS MD [place]  Appended Document: Generic Letter pt letter mailed

## 2010-02-08 NOTE — Assessment & Plan Note (Signed)
Summary: female problem,tcb   Vital Signs:  Patient profile:   50 year old female Height:      63 inches Weight:      206.5 pounds BMI:     36.71 Temp:     98.2 degrees F oral Pulse rate:   96 / minute BP sitting:   176 / 90  (left arm) Cuff size:   regular  Vitals Entered By: Levert Feinstein LPN (May 13, 624THL [id number] AM) CC: infected hair bump in groin area Is Patient Diabetic? No Pain Assessment Patient in pain? no        Primary Care Provider:  Mariana Arn  MD  CC:  infected hair bump in groin area.  History of Present Illness: hair bump: first had one on right labia about 1 wk ago, then developed 2nd one on left labia.  one on R is improving after draining some but left is getting worse and more painful.  she wonders if this has anything to do with taking metronidazole recently or having had her menses recently.  she doesn't think the left one has been draining at all.  she denies fevers or chills or bumps elsewhere on her body.      Habits & Providers  Alcohol-Tobacco-Diet     Tobacco Status: never  Current Medications (verified): 1)  Bayer Childrens Aspirin 81 Mg Chew (Aspirin) .... Take 1 Tablet By Mouth Once A Day 2)  Glucotrol Xl 10 Mg Tb24 (Glipizide) .... Take 1 Tablet By Mouth Twice A Day 3)  Onetouch Ultra Test  Strp (Glucose Blood) .Marland Kitchen.. 1 Strip Every Morning 4)  Zyrtec Allergy 10 Mg Tabs (Cetirizine Hcl) .Marland Kitchen.. 1 Tablet By Mouth At Bedtime As Needed - Not Taking Much 5)  Carvedilol 12.5 Mg Tabs (Carvedilol) .... One Tab By Mouth Two Times A Day 6)  Vasotec 20 Mg  Tabs (Enalapril Maleate) .... Take 2 Tablets Once A Day 7)  Hydrochlorothiazide 25 Mg  Tabs (Hydrochlorothiazide) .... Take One Tablet Daily. 8)  Lantus Solostar 100 Unit/ml  Soln (Insulin Glargine) .... Inject 60 Units Daily and Increase By 1 Unit Every Day As Long As Your Fasting Blood Sugar Is Greater Than 100. Disp 1 Month Supply 9)  Norvasc 10 Mg  Tabs (Amlodipine Besylate) .... Take One Tablet  Daily 10)  Neurontin 300 Mg  Caps (Gabapentin) .... Take One Tablet By Mouth At Bedtime For Feet Pain 11)  Ultra Touch Pen Needles .... Dm: 250.00 Check Sugar Daily in Am.  Dispense One Month Supply 12)  Lipitor 40 Mg Tabs (Atorvastatin Calcium) .... One Tab By Mouth Qday 13)  Doxycycline Hyclate 100 Mg Cpep (Doxycycline Hyclate) .Marland Kitchen.. 1 By Mouth Two Times A Day For 10 Days. 14)  Ibuprofen 600 Mg Tabs (Ibuprofen) .Marland Kitchen.. 1 By Mouth Q6hr As Needed Pain.  Allergies (verified): No Known Drug Allergies  Past History:  Past medical, surgical, family and social histories (including risk factors) reviewed for relevance to current acute and chronic problems.  Past Medical History: Reviewed history from 06/05/2008 and no changes required. Diabetes HTN Obesity Fibroids and possibly adenomyosis on [place] 5/10  Past Surgical History: Reviewed history from 11/16/2006 and no changes required. No surgeries  Family History: Reviewed history from 05/26/2008 and no changes required. Diabetes 1st degree - father and grandfather No colon cancer or female cancer. No CAD or CVAs.  No FH of breast cancer  Social History: Reviewed history from 11/16/2006 and no changes required. Has never smokes, No ETOH, Drug.  Review of Systems       per HPI  Physical Exam  General:  obese, NAD. VS noted - very hypertensive consistent with previous visits Skin:  R superior edge of labia majora with pea sized indurated and erythematous area with punctate opening where it appears it has been draining.  left labia majora at approximately the area of the introitus with approx 4cm in length and 1.5cm in width firm, indurated, erythematous nodule with central fluctuance.  very tender to touch. Additional Exam:  procedure note: informed consent obtained area identified area prepped in sterile fashion with betadyne and isopropyl alcohol area anesthestized with approx 3.5cc 1% xylocaine with epi and then with  ethylchloride cold spray area opened with #11 blade in cruciate fashion. small amt of pus obtained and wound culture obtained.   area probed with hemostat to break up any loculations without a deep wound pocket found. bacitracin ointment applied and covered with clean gauze. hemostasis achieved easily with a little pressure <5cc blood loss patient tolerated procedure well.   Impression & Recommendations:  Problem # 1:  OTHER ABSCESS OF VULVA (ICD-616.4) Assessment New s/p I&D.  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  in addition given precautions on pt instructions and prescribed ibuprofen for pain relief.  f/u 2 wks with PCP to again address DM and HTN control.  Discussed how this likely is related to poorly controlled diabetes with how quickly it got infected.  Orders: Culture, Wound -[place]:[id number]) I&D Abcess, simple- [place] ([id number])  Complete Medication List: 1)  Bayer Childrens Aspirin 81 Mg Chew (Aspirin) .... Take 1 tablet by mouth once a day 2)  Glucotrol Xl 10 Mg Tb24 (Glipizide) .... Take 1 tablet by mouth twice a day 3)  Onetouch Ultra Test Strp (Glucose blood) .Marland Kitchen.. 1 strip every morning 4)  Zyrtec Allergy 10 Mg Tabs (Cetirizine hcl) .Marland Kitchen.. 1 tablet by mouth at bedtime as needed - not taking much 5)  Carvedilol 12.5 Mg Tabs (Carvedilol) .... One tab by mouth two times a day 6)  Vasotec 20 Mg Tabs (Enalapril maleate) .... Take 2 tablets once a day 7)  Hydrochlorothiazide 25 Mg Tabs (Hydrochlorothiazide) .... Take one tablet daily. 8)  Lantus Solostar 100 Unit/ml Soln (Insulin glargine) .... Inject 60 units daily and increase by 1 unit every day as long as your fasting blood sugar is greater than 100. disp 1 month supply 9)  Norvasc 10 Mg Tabs (Amlodipine besylate) .... Take one tablet daily 10)  Neurontin 300 Mg Caps (Gabapentin) .... Take one tablet by mouth at bedtime for feet  pain 11)  Ultra Touch Pen Needles  .... Dm: 250.00 check sugar daily in am.  dispense one month supply 12)  Lipitor 40 Mg Tabs (Atorvastatin calcium) .... One tab by mouth qday 13)  Doxycycline Hyclate 100 Mg Cpep (Doxycycline hyclate) .Marland Kitchen.. 1 by mouth two times a day for 10 days. 14)  Ibuprofen 600 Mg Tabs (Ibuprofen) .Marland Kitchen.. 1 by mouth q6hr as needed pain.  Patient Instructions: 1)  Be sure to pick up your antibiotic at [facility] and take it until it is all gone. 2)  Sit in the bath at least 2 times daily for about 10 minutes to help keep this wound opened. 3)  Keep the wound covered with a little antibiotic ointment and gauze - you can use your underwear to keep it held on.  4)  It will likely take 10-14 days for this to completely heal.  Let us know if you develop fevers/chills, increased pain, increased swelling or size.   5)  Remember, keeping your diabetes under good control helps keep these things from happening. 6)  Please schedule an appt for follow up with Dr Sherilyn Cooter for your medical problems in about 2 weeks. Prescriptions: IBUPROFEN 600 MG TABS (IBUPROFEN) 1 by mouth q6hr as needed pain.  #100 x 1   Entered and Authorized by:   Patria Mane  MD   Signed by:   Patria Mane  MD on 05/21/2009   Method used:   Electronically to        [id number] (retail)       [address] [place], [place]  [address]       Ph: [phone]       Fax: [phone]   RxID:   [id number] DOXYCYCLINE HYCLATE 100 MG CPEP (DOXYCYCLINE HYCLATE) 1 by mouth two times a day for 10 days.  #20 x 0   Entered and Authorized by:   Patria Mane  MD   Signed by:   Patria Mane  MD on 05/21/2009   Method used:   Electronically to        [id number] (retail)       [address] [place], [place]  [address]       Ph: [phone]       Fax: [phone]   RxID:   [id number]

## 2010-02-08 NOTE — Progress Notes (Signed)
Summary: refill  Phone Note Refill Request Message from:  Patient  Refills Requested: Medication #1:  GLUCOTROL XL 10 MG TB24 Take 1 tablet by mouth twice a day "pharm has been trying to call"  Initial call taken by: Audie Clear,  January 13, 2009 10:10 AM  Follow-up for Phone Call        to pcp Follow-up by: Elige Radon RN,  January 13, 2009 10:12 AM    Prescriptions: GLUCOTROL XL 10 MG TB24 (GLIPIZIDE) Take 1 tablet by mouth twice a day  #60 Tablet x 3   Entered and Authorized by:   Mariana Arn  MD   Signed by:   Mariana Arn  MD on 01/13/2009   Method used:   Electronically to        [facility] (retail)       [address] [place], [place]  [address]       Ph: [phone]       Fax: [phone]   RxID:   [id number]

## 2010-02-08 NOTE — Assessment & Plan Note (Signed)
Summary: f/u dm,df   Vital Signs:  Patient profile:   50 year old female Height:      63 inches Weight:      215.2 pounds BMI:     38.26 Temp:     98.1 degrees F oral Pulse rate:   80 / minute BP sitting:   142 / 90  (left arm) Cuff size:   regular  Vitals Entered By: Levert Feinstein LPN (April 14, 624THL 624THL AM) CC: f/u dm Is Patient Diabetic? Yes Did you bring your meter with you today? No Pain Assessment Patient in pain? no        Primary Care Provider:  Mariana Arn  MD  CC:  f/u dm.  History of Present Illness: 1. DM - Taking Lantus 60 units q hs. Glucotrol XL 10 mg two times a day  and says she does not miss doses.  Fasting sugars 150s. 2hr t post prandial sugars are in 170-200 range. Denies polyuria, polydipsia, vision change. Reports last eye doctor visit was within past year. Last foot exam in May. A1C 11.0 today unchanged from 10.9 at last check.  Was unable to tolerate metformin in the past.  2. HTN - BP 142/90 on recheck. States that she takes her medications on most days. No CP,SOB, vison changes, HAs, edema. Takeing Metroprolol 100 two times a day, HCTZ 25 mg, Enalapril 40 mg. Also on Norvasc 10 mg. More careful about salt inDoes not check BPs at home.   3. HLD: not at goal of < 70 (w/ DM2) at last check. LDL is 125. Not on statin   4) Vaginal itching: Reports vaginal itching, burning and dryness x 2 weeks. Sexually active. Uses condoms most days. Denies dysuria, discharge, fever, CVA tenderness.   5) Irregular periods: Reports has missed last two periods. Spotting this month. Denies heavy bleeding. Denies hot flashes, reports vaginal dryness. Sexually active as above.      Habits & Providers  Alcohol-Tobacco-Diet     Tobacco Status: never  Current Medications (verified): 1)  Bayer Childrens Aspirin 81 Mg Chew (Aspirin) .... Take 1 Tablet By Mouth Once A Day 2)  Glucotrol Xl 10 Mg Tb24 (Glipizide) .... Take 1 Tablet By Mouth Twice A Day 3)  Onetouch Ultra  Test  Strp (Glucose Blood) .Marland Kitchen.. 1 Strip Every Morning 4)  Zyrtec Allergy 10 Mg Tabs (Cetirizine Hcl) .Marland Kitchen.. 1 Tablet By Mouth At Bedtime As Needed - Not Taking Much 5)  Carvedilol 12.5 Mg Tabs (Carvedilol) .... One Tab By Mouth Two Times A Day 6)  Vasotec 20 Mg  Tabs (Enalapril Maleate) .... Take 2 Tablets Once A Day 7)  Hydrochlorothiazide 25 Mg  Tabs (Hydrochlorothiazide) .... Take One Tablet Daily. 8)  Lantus Solostar 100 Unit/ml  Soln (Insulin Glargine) .... Inject 60 Units Daily and Increase By 1 Unit Every Day As Long As Your Fasting Blood Sugar Is Greater Than 100. Disp 1 Month Supply 9)  Norvasc 10 Mg  Tabs (Amlodipine Besylate) .... Take One Tablet Daily 10)  Neurontin 300 Mg  Caps (Gabapentin) .... Take One Tablet By Mouth At Bedtime For Feet Pain 11)  Ultra Touch Pen Needles .... Dm: 250.00 Check Sugar Daily in Am.  Dispense One Month Supply 12)  Lipitor 40 Mg Tabs (Atorvastatin Calcium) .... One Tab By Mouth Qday 13)  Metronidazole 500 Mg Tabs (Metronidazole) .... One Tab By Mouth Two Times A Day X 7 Days  Allergies (verified): No Known Drug Allergies  Past History:  Past  Medical History: Last updated: 06/05/2008 Diabetes HTN Obesity Fibroids and possibly adenomyosis on [place] 5/10  Past Surgical History: Last updated: 11/16/2006 No surgeries  Family History: Last updated: 05/26/2008 Diabetes 1st degree - father and grandfather No colon cancer or female cancer. No CAD or CVAs.  No FH of breast cancer  Social History: Last updated: 11/16/2006 Has never smokes, No ETOH, Drug.   Physical Exam  General:  obese, NAD  Eyes:  PERRL, EOMI, no retinopathy on funduscopic exam.  Lungs:  CTAB w/o wheeze or crackles, normal WOB  Heart:  RRR, no murmurs  Abdomen:  obese  Genitalia:  mild vaginal dryness w/o atrophy  no discharge or lesions  Pulses:  2+ pedal  Extremities:  no edema  Neurologic:  sensation intact to light touch bilateral lower extremities .    Diabetes  Management Exam:    Foot Exam (with socks and/or shoes not present):       Sensory-Pinprick/Light touch:          Left medial foot (L-4): normal          Left dorsal foot (L-5): normal          Left lateral foot (S-1): normal          Right medial foot (L-4): normal          Right dorsal foot (L-5): normal          Right lateral foot (S-1): normal       Sensory-Monofilament:          Left foot: normal          Right foot: normal       Inspection:          Left foot: normal          Right foot: normal       Nails:          Left foot: normal          Right foot: normal    Eye Exam:       Eye Exam done elsewhere          Date: 09/08/2008          Results: normal          Done by: outside ophtho   Impression & Recommendations:  Problem # 1:  DIABETES MELLITUS II, UNCOMPLICATED ([id number]) Assessment Unchanged  Not at goal. Will have patient titrate up on Lantus for better control. Continue medications as below. A1C in three months. CMET today Her updated medication list for this problem includes:    Bayer Childrens Aspirin 81 Mg Chew (Aspirin) .Marland Kitchen... Take 1 tablet by mouth once a day    Glucotrol Xl 10 Mg Tb24 (Glipizide) .Marland Kitchen... Take 1 tablet by mouth twice a day    Vasotec 20 Mg Tabs (Enalapril maleate) .Marland Kitchen... Take 2 tablets once a day    Lantus Solostar 100 Unit/ml Soln (Insulin glargine) ..... Inject 60 units daily and increase by 1 unit every day as long as your fasting blood sugar is greater than 100. disp 1 month supply  Orders: A1C-[facility] [id number]) [facility]- Est  Level 4 [id number])  Medications as below. A1C improving however not at goal of < 7. Advised regarding diet and exercise changes. Will follow in three months. Consider pharm clinic referral if not improving further.   Her updated medication list for this problem includes:    Bayer Childrens Aspirin 81 Mg Chew (Aspirin) .Marland Kitchen... Take 1 tablet by mouth once  a day    Glucotrol Xl 10 Mg Tb24 (Glipizide) .Marland Kitchen... Take 1 tablet by mouth  twice a day    Vasotec 20 Mg Tabs (Enalapril maleate) .Marland Kitchen... Take 2 tablets once a day    Lantus Solostar 100 Unit/ml Soln (Insulin glargine) ..... Inject 60 units daily and increase by 1 unit every day as long as your fasting blood sugar is greater than 100. disp 1 month supply  Orders: A1C-[facility] [id number])  Labs Reviewed: Creat: 0.48 (11/11/2007)   Microalbumin: positive (trace) (07/02/2006) Reviewed HgBA1c results: 10.9 (10/05/2008)  12.8 (05/26/2008)  Labs Reviewed: Creat: 0.48 (11/11/2007)   Microalbumin: positive (trace) (07/02/2006)  Last Eye Exam: normal (09/08/2008) Reviewed HgBA1c results: 11.0 (04/22/2009)  10.9 (10/05/2008)  Problem # 2:  HYPERLIPIDEMIA (ICD-272.4) Assessment: Unchanged  Will start statin. LFTs now and at 6 weeks. Advised regarding diet and exercise.   Her updated medication list for this problem includes:    Lipitor 40 Mg Tabs (Atorvastatin calcium) ..... One tab by mouth qday  Orders: Comp Met-[facility] [phone]) [facility]- Est  Level 4 [id number])  Labs Reviewed: SGOT: 11 (11/11/2007)   SGPT: 11 (11/11/2007)   HDL:41 (11/11/2007), 48 (04/02/2006)  LDL:125 (11/11/2007), 97 (04/02/2006)  Chol:192 (11/11/2007), 178 (04/02/2006)  Trig:131 (11/11/2007), 166 (04/02/2006)  Problem # 3:  HYPERTENSION, ESSENTIAL NOS (ICD-401.9) Assessment: Unchanged  Not at goal but improved from last visit. Will switch metoprolol to Coreg for better control. DASH diet, exercise reviewed.   Her updated medication list for this problem includes:    Carvedilol 12.5 Mg Tabs (Carvedilol) ..... One tab by mouth two times a day    Vasotec 20 Mg Tabs (Enalapril maleate) .Marland Kitchen... Take 2 tablets once a day    Hydrochlorothiazide 25 Mg Tabs (Hydrochlorothiazide) .Marland Kitchen... Take one tablet daily.    Norvasc 10 Mg Tabs (Amlodipine besylate) .Marland Kitchen... Take one tablet daily  BP today: 142/90 Prior BP: 171/97 (10/05/2008)  Labs Reviewed: K+: 3.6 (11/11/2007) Creat: : 0.48 (11/11/2007)   Chol: 192  (11/11/2007)   HDL: 41 (11/11/2007)   LDL: 125 (11/11/2007)   TG: 131 (11/11/2007)  Orders: [place]- Est  Level 4 [id number])  Problem # 4:  VAGINAL PRURITUS (ICD-698.1) Assessment: New  Wet prep w/ TNTC cocci. Will treat for BV w/ flagyl x 7 days. Also likely some degree of hormonal contribution from perimenopausal state.   Orders: [place]- Est  Level 4 [id number])  Problem # 5:  AMENORRHEA (ICD-626.0) Assessment: New Upreg negative. Likely perimenopausal. Advised to use protection each time to prevent pregnancy. Will follow, but no testing at this time.  Orders: U Preg-[facility] ([id number]) [place]- Est  Level 4 [id number])  Complete Medication List: 1)  Bayer Childrens Aspirin 81 Mg Chew (Aspirin) .... Take 1 tablet by mouth once a day 2)  Glucotrol Xl 10 Mg Tb24 (Glipizide) .... Take 1 tablet by mouth twice a day 3)  Onetouch Ultra Test Strp (Glucose blood) .Marland Kitchen.. 1 strip every morning 4)  Zyrtec Allergy 10 Mg Tabs (Cetirizine hcl) .Marland Kitchen.. 1 tablet by mouth at bedtime as needed - not taking much 5)  Carvedilol 12.5 Mg Tabs (Carvedilol) .... One tab by mouth two times a day 6)  Vasotec 20 Mg Tabs (Enalapril maleate) .... Take 2 tablets once a day 7)  Hydrochlorothiazide 25 Mg Tabs (Hydrochlorothiazide) .... Take one tablet daily. 8)  Lantus Solostar 100 Unit/ml Soln (Insulin glargine) .... Inject 60 units daily and increase by 1 unit every day as long as your fasting blood sugar is greater than 100. disp  1 month supply 9)  Norvasc 10 Mg Tabs (Amlodipine besylate) .... Take one tablet daily 10)  Neurontin 300 Mg Caps (Gabapentin) .... Take one tablet by mouth at bedtime for feet pain 11)  Ultra Touch Pen Needles  .... Dm: 250.00 check sugar daily in am.  dispense one month supply 12)  Lipitor 40 Mg Tabs (Atorvastatin calcium) .... One tab by mouth qday 13)  Metronidazole 500 Mg Tabs (Metronidazole) .... One tab by mouth two times a day x 7 days  [facility] [id number])  Patient Instructions: 1)  It was  great to see you today! 2)  STOP TAKING METOPROLOL 3)   START TAKING COREG (FOR BLOOD PRESSURE) 4)  START TAKING LIPITOR FOR CHOLESTEROL.  5)  TAKE METRONIDAZOLE TO GET RID OF THE VAGINAL ITCHING (BACTERIAL VAGINOSIS) 6)  Follow up with Dr. Sherilyn Cooter in 1 month.  7)  Sodium = salt on labels. Keep sodium below 2000 mg. Add up how much sodium you get in one day and write it down. 8)  Increase your water. 9)  Increase walking to 30 minutes a day 4-5 times per week.  10)  Continue to use protection with intercourse. Prescriptions: METRONIDAZOLE 500 MG TABS (METRONIDAZOLE) one tab by mouth two times a day x 7 days  #14 x 0   Entered and Authorized by:   Mariana Arn  MD   Signed by:   Mariana Arn  MD on 04/22/2009   Method used:   Electronically to        [id number] (retail)       [address] [place], [place]  [address]       Ph: [phone]       Fax: [phone]   RxID:   [id number] CARVEDILOL 12.5 MG TABS (CARVEDILOL) one tab by mouth two times a day  #60 x 0   Entered and Authorized by:   Mariana Arn  MD   Signed by:   Mariana Arn  MD on 04/22/2009   Method used:   Electronically to        [id number] (retail)       [address] [place], [place]  [address]       Ph: [phone]       Fax: [phone]   RxID:   [id number] LIPITOR 40 MG TABS (ATORVASTATIN CALCIUM) one tab by mouth qday  #30 x 3   Entered and Authorized by:   Mariana Arn  MD   Signed by:   Mariana Arn  MD on 04/22/2009   Method used:   Electronically to        [facility] [phone]* (retail)       [address] [place], [place]  [address]       Ph: [phone]       Fax: [phone]   RxID:   [id number]   Laboratory Results   Urine Tests  Date/Time Received: April 22, 2009 8:46 AM  Date/Time Reported: April 22, 2009 8:53 AM     Urine HCG:  negative Comments: ...............test performed by......Marland KitchenBonnie A. Martinique, MLS (ASCP)cm   Blood Tests   Date/Time Received: April 22, 2009 8:38 AM  Date/Time Reported:  April 22, 2009 8:54 AM   HGBA1C: 11.0%   (Normal Range: Non-Diabetic - 3-6%   Control Diabetic - 6-8%)  Comments: ...............test performed by......Marland KitchenBonnie A. Martinique, MLS (ASCP)cm  Date/Time Received: April 22, 2009 9:17 AM  Date/Time Reported: April 22, 2009 9:27 AM   Dorette Grate Source: vag WBC/hpf: 1-5 Bacteria/hpf: LOADED  Cocci Clue cells/hpf: moderate  Negative whiff Yeast/hpf: moderate Trichomonas/hpf: none Comments: ...............test performed by......Marland KitchenBonnie A. Martinique, MLS (ASCP)cm       Prevention & Chronic Care Immunizations   Influenza vaccine: Fluvax Non-MCR  (11/20/2008)   Influenza vaccine due: 10/18/2008    Tetanus booster: 01/09/2001: Done.   Tetanus booster due: 01/10/2011    Pneumococcal vaccine: Done.  (11/09/2004)   Pneumococcal vaccine due: None  Colorectal Screening   Hemoccult: Done.  (01/10/2004)   Hemoccult due: 01/09/2005    Colonoscopy: Not documented  Other Screening   Pap smear: normal  (05/26/2008)   Pap smear due: 05/27/2011    Mammogram: Not documented   Mammogram due: 11/26/2008   Smoking status: never  (04/22/2009)  Diabetes Mellitus   HgbA1C: 11.0  (04/22/2009)   Hemoglobin A1C due: 01/04/2009    Eye exam: normal  (09/08/2008)   Eye exam due: 09/2009    Foot exam: yes  (04/22/2009)   Foot exam action/deferral: Do today   High risk foot: Not documented   Foot care education: Not documented   Foot exam due: 05/26/2009    Urine microalbumin/creatinine ratio: Not documented   Urine microalbumin/cr due: 07/02/2007    Diabetes flowsheet reviewed?: Yes   Progress toward A1C goal: Unchanged  Lipids   Total Cholesterol: 192  (11/11/2007)   LDL: 125  (11/11/2007)   LDL Direct: 125  (05/26/2008)   HDL: 41  (11/11/2007)    Triglycerides: 131  (11/11/2007)   Lipid panel due: 07/22/2009    SGOT (AST): 11  (11/11/2007)   SGPT (ALT): 11  (11/11/2007) CMP ordered    Alkaline phosphatase: 78  (11/11/2007)   Total bilirubin: 0.5  (11/11/2007)   Liver panel due: 05/22/2009    Lipid flowsheet reviewed?: Yes   Progress toward LDL goal: Unchanged  Hypertension   Last Blood Pressure: 142 / 90  (04/22/2009)   Serum creatinine: 0.48  (11/11/2007)   BMP action: Ordered   Serum potassium 3.6  (11/11/2007) CMP ordered    Basic metabolic panel due: 0000000    Hypertension flowsheet reviewed?: Yes   Progress toward BP goal: Improved  Self-Management Support :   Personal Goals (by the next clinic visit) :     Personal A1C goal: 9  (10/05/2008)     Personal blood pressure goal: 130/80  (10/05/2008)     Personal LDL goal: 100  (10/05/2008)    Patient will work on the following items until the next clinic visit to reach self-care goals:     Medications and monitoring: take my medicines every day, check my blood sugar, check my blood pressure, bring all of my medications to every visit, weigh myself weekly, examine my feet every day  (04/22/2009)     Eating: drink diet soda or water instead of juice or soda, eat more vegetables, use fresh or frozen vegetables, eat foods that are low in salt, eat baked foods instead of fried foods, eat fruit for snacks and desserts, limit or avoid alcohol  (04/22/2009)     Activity: take a 30 minute walk every day, park at the far end of the parking lot  (10/05/2008)  Diabetes self-management support: Copy of home glucose meter record, CBG self-monitoring log, Written self-care plan  (10/05/2008)    Diabetes self-management support not done because: Good outcomes  (04/22/2009)    Hypertension self-management support: Written self-care plan, Education handout  (04/22/2009)   Hypertension self-care plan printed.   Hypertension education handout printed    Lipid self-management  support: Written self-care plan  (04/22/2009)   Lipid self-care plan printed.

## 2010-02-08 NOTE — Progress Notes (Signed)
Summary: needle refill  Phone Note Call from Patient Call back at Work Phone [phone]   Caller: Patient Summary of Call: Would like to see hif she can get some VD ultra fine pen needles. Initial call taken by: Raymond Gurney,  September 20, 2009 8:52 AM  Follow-up for Phone Call        she wanted samples. told her we do not keep samples any more. she will call her pharmacy & ask for a refill on these Follow-up by: Elige Radon RN,  September 20, 2009 8:57 AM

## 2010-02-08 NOTE — Progress Notes (Signed)
Summary: Rx Ques  Phone Note Call from Patient Call back at Work Phone [phone]   Caller: Patient Summary of Call: Wondering if she can get samples of some solar star lantus the pens. Initial call taken by: Raymond Gurney,  May 03, 2009 9:54 AM  Follow-up for Phone Call        patient notified that 2 samples are available for her. [employer] lot # [id number]  exp date 07/2011. Follow-up by: Marcell Barlow RN,  May 03, 2009 10:28 AM

## 2010-02-08 NOTE — Assessment & Plan Note (Signed)
Summary: F/U OFFICE VISIT     DPG   Vital Signs:  Patient Profile:   50 Years Old Female Height:     63.75 inches (161.93 cm) Weight:      194 pounds (88.18 kg) BMI:     33.68 Temp:     98.6 degrees F (37.00 degrees C) Pulse rate:   76 / minute BP sitting:   160 / 81  Vitals Entered By: Christen Bame CMA (July 09, 2007 8:44 AM)                 PCP:  Rolly Salter MD  Chief Complaint:  routine visit - HTN.  History of Present Illness: 50 yo with HTN, DM, obesity here for routine follow up.  1. DM - Taking Lantus 48 units q hs and Glucotrol XL 10 mg BID and says she does not miss doses.  Fasting sugars 150-160s. 2 Hour postprandials are 200s. Feet are doing well. Saw eye doc in April. Doses have some polydipsia. AIC is 11.5. Was unable to tolerate metformin in the past.   2. HTN - No CP,SOB, vison changes, HAs, edema. Takeing Metroprolol 100 two times a day, HCTZ, Enalapril 40 mg. BP still high today. Also on Norvasc 5 mg.   3. Contraception - OCPs stopped at last visit because of HTN. Currently using condoms. Did not have menses in May. Has had a little spotting - light pink every other day this month since went off 2 months ago but not exactly like a regular period. No hotflashes, no irritability, some vaginal dryness. Had some hotflashes right after stopping OCPs but none now.  4. Sometimes has numbness in feet and hands - sees podiatrist. Takes neurontin occasionally that was prescribed at last appointment and this helps some.     Current Allergies: No known allergies       Physical Exam  General:     alert, well-developed, and well-nourished.   Lungs:     normal respiratory effort, normal breath sounds, no crackles, and no wheezes.   Heart:     normal rate, regular rhythm, no murmur, no gallop, and no rub.   Abdomen:     soft, non-tender, and no distention.   Pulses:     R and L dorsalis pedis normal.   Extremities:     no edema.     Impression &  Recommendations:  Problem # 1:  CONTRACEPTIVE MANAGEMENT (ICD-V25.09) Assessment: Unchanged Using condoms since unable to use OCPs due to HTN. Will check FSH to see if postmenopausal range. If not, consider IUD, diaphragm, micronor, or depo. Given bag of condoms. Pap smear at next appointment.  Orders: [place]- Est  Level 4 [id number])   Problem # 2:  DIABETES MELLITUS II, UNCOMPLICATED ([id number]) Assessment: Deteriorated AIC not 11 despite trying to increase lantus daily. Increase lantus to 50 units tonight. Will suggest pharm clinic to patient to see if this helps.   Her updated medication list for this problem includes:    Bayer Childrens Aspirin 81 Mg Chew (Aspirin) .Marland Kitchen... Take 1 tablet by mouth once a day    Glucotrol Xl 10 Mg Tb24 (Glipizide) .Marland Kitchen... Take 1 tablet by mouth twice a day    Vasotec 20 Mg Tabs (Enalapril maleate) .Marland Kitchen... Take 2 tablets once a day and make an appointment with our clinic.    Lantus Solostar 100 Unit/ml Soln (Insulin glargine) ..... Inject 50 units daily and increase by 1 unit every day as long as your fasting  blood sugar is greater than 100. disp 1 month supply  Orders: A1C-[facility] [id number]) [place]- Est  Level 4 [id number])   Problem # 3:  HYPERTENSION, BENIGN SYSTEMIC (ICD-401.1) Assessment: Improved Slight improvement on Norvasc and stopping OCPs but still elevated. Increase norvasc to 10 mg daily and continue all other meds.   Her updated medication list for this problem includes:    Metoprolol Tartrate 100 Mg Tabs (Metoprolol tartrate) .Marland Kitchen... Take one tablet two times a day    Vasotec 20 Mg Tabs (Enalapril maleate) .Marland Kitchen... Take 2 tablets once a day and make an appointment with our clinic.    Hydrochlorothiazide 25 Mg Tabs (Hydrochlorothiazide) .Marland Kitchen... Take one tablet daily.    Norvasc 10 Mg Tabs (Amlodipine besylate) .Marland Kitchen... Take one tablet daily   Complete Medication List: 1)  Bayer Childrens Aspirin 81 Mg Chew (Aspirin) .... Take 1 tablet by mouth once a day 2)   Glucotrol Xl 10 Mg Tb24 (Glipizide) .... Take 1 tablet by mouth twice a day 3)  Onetouch Ultra Test Strp (Glucose blood) .Marland Kitchen.. 1 strip every morning 4)  Zyrtec Allergy 10 Mg Tabs (Cetirizine hcl) .Marland Kitchen.. 1 tablet by mouth at bedtime as needed - not taking much 5)  Metoprolol Tartrate 100 Mg Tabs (Metoprolol tartrate) .... Take one tablet two times a day 6)  Vasotec 20 Mg Tabs (Enalapril maleate) .... Take 2 tablets once a day and make an appointment with our clinic. 7)  Hydrochlorothiazide 25 Mg Tabs (Hydrochlorothiazide) .... Take one tablet daily. 8)  Lantus Solostar 100 Unit/ml Soln (Insulin glargine) .... Inject 50 units daily and increase by 1 unit every day as long as your fasting blood sugar is greater than 100. disp 1 month supply 9)  Norvasc 10 Mg Tabs (Amlodipine besylate) .... Take one tablet daily 10)  Neurontin 300 Mg Caps (Gabapentin) .... Take one tablet by mouth at bedtime for feet pain  Other Orders: [facility] [id number]) [facility] [id number])   Patient Instructions: 1)  Increase your Norvasc to 10 mg daily. 2)  Take 50 units of Lantus tonight and increase to 51 mg tomorrow night if your fasting blood sugar remains >100 in the morning.  3)  Continue increasing by 1 unit every day if blood sugar>100 4)  I also recommend that you see Nicholas Lose our pharmacist. He can help you out a lot with managing your diabetic medications. 5)  Make an appointment with me in 1 month, you will need a pap smear if still spotting at that time.  6)  Bring a record of your blood sugars. 7)  Use condoms daily. We will check your [facility] today - hormone that may tell us if you are in menopause.    Prescriptions: LANTUS SOLOSTAR 100 UNIT/ML  SOLN (INSULIN GLARGINE) Inject 50 units daily and increase by 1 unit every day as long as your fasting blood sugar is greater than 100. Disp 1 month supply  #1 x 6   Entered and Authorized by:   Rolly Salter MD   Signed by:   Rolly Salter MD on 07/11/2007   Method  used:   Electronically sent to ...       [id number]*       [address] [place], [place]  [address]       Ph: [phone]       Fax: [phone]   RxID:   [id number]  MG  TABS (AMLODIPINE BESYLATE) Take one tablet daily  #30 x 3   Entered and Authorized by:   Rolly Salter MD   Signed by:   Rolly Salter MD on 07/09/2007   Method used:   Print then Give to Patient   RxID:   [id number] LANTUS SOLOSTAR 100 UNIT/ML  SOLN (INSULIN GLARGINE) Inject 42 units daily and increase by 1 unit every 2 days as long as your fasting blood sugar is greater than 120. Disp 1 month supply  #3 x 1   Entered and Authorized by:   Rolly Salter MD   Signed by:   Rolly Salter MD on 07/09/2007   Method used:   Print then Give to Patient   [id number] HYDROCHLOROTHIAZIDE 25 MG  TABS (HYDROCHLOROTHIAZIDE) Take one tablet daily.  #90 x 0   Entered and Authorized by:   Rolly Salter MD   Signed by:   Rolly Salter MD on 07/09/2007   Method used:   Print then Give to Patient   [id number] VASOTEC 20 MG  TABS (ENALAPRIL MALEATE) Take 2 tablets once a day and make an appointment with our clinic.  #90 x 0   Entered and Authorized by:   Rolly Salter MD   Signed by:   Rolly Salter MD on 07/09/2007   Method used:   Print then Give to Patient   [id number] METOPROLOL TARTRATE 100 MG  TABS (METOPROLOL TARTRATE) Take one tablet two times a day  #90 x 0   Entered and Authorized by:   Rolly Salter MD   Signed by:   Rolly Salter MD on 07/09/2007   Method used:   Print then Give to Patient   RxID:   [id number] GLUCOTROL XL 10 MG TB24 (GLIPIZIDE) Take 1 tablet by mouth twice a day  #90 x 0   Entered and Authorized by:   Rolly Salter MD   Signed by:   Rolly Salter MD on 07/09/2007   Method used:   Print then Give to Patient   [id number] VASOTEC 20 MG  TABS (ENALAPRIL MALEATE) Take  2 tablets once a day and make an appointment with our clinic.  #90 x 0   Entered and Authorized by:   Rolly Salter MD   Signed by:   Rolly Salter MD on 07/09/2007   Method used:   Electronically sent to ...       [place], [place]  [address]       Ph: [phone]       Fax: [phone]   RxID:   [id number] LANTUS SOLOSTAR 100 UNIT/ML  SOLN (INSULIN GLARGINE) Inject 42 units daily and increase by 1 unit every 2 days as long as your fasting blood sugar is greater than 120. Disp 1 month supply  #3 x 1   Entered and Authorized by:   Rolly Salter MD   Signed by:   Rolly Salter MD on 07/09/2007   Method used:   Electronically sent to ...       [id number]*       [address] [place],   [address]       Ph: [phone]  Fax: [phone]   [id number] HYDROCHLOROTHIAZIDE 25 MG  TABS (HYDROCHLOROTHIAZIDE) Take one tablet daily.  #90 x 0   Entered and Authorized by:   Rolly Salter MD   Signed by:   Rolly Salter MD on 07/09/2007   Method used:   Electronically sent to ...       [place], [place]  [address]       Ph: [phone]       Fax: [phone]   RxID:   [id number] METOPROLOL TARTRATE 100 MG  TABS (METOPROLOL TARTRATE) Take one tablet two times a day  #90 x 0   Entered and Authorized by:   Rolly Salter MD   Signed by:   Rolly Salter MD on 07/09/2007   Method used:   Electronically sent to ...       [place], [place]  [address]       Ph: [phone]       Fax: [phone]   RxID:   [id number] GLUCOTROL XL 10 MG TB24 (GLIPIZIDE) Take 1 tablet by mouth twice a day  #90 x 0   Entered and Authorized by:   Rolly Salter MD   Signed by:   Rolly Salter MD on  07/09/2007   Method used:   Electronically sent to ...       [place], [place]  [address]       Ph: [phone]       Fax: [phone]   RxID:   [id number] NORVASC 10 MG  TABS (AMLODIPINE BESYLATE) Take one tablet daily  #30 x 3   Entered and Authorized by:   Rolly Salter MD   Signed by:   Rolly Salter MD on 07/09/2007   Method used:   Electronically sent to ...       [id number]*       [address] [place], [place]  [address]       Ph: [phone]       Fax: [phone]   RxID:   [id number]  ] Laboratory Results   Urine Tests  Date/Time Received: July 09, 2007 10:00 AM  Date/Time Reported: July 09, 2007 10:27 AM     Urine HCG: negative CommentsMauricia Chaney CMA,  July 09, 2007 10:27 AM   Blood Tests   Date/Time Received: July 09, 2007 8:45 AM  Date/Time Reported: July 09, 2007 9:02 AM   HGBA1C: 11.5%   (Normal Range: Non-Diabetic - 3-6%   Control Diabetic - 6-8%)  Comments: THEKLA SLADE CMA,  July 09, 2007 9:02 AM

## 2010-02-08 NOTE — Progress Notes (Signed)
Summary: Insulin  Phone Note Call from Patient Call back at Home Phone [phone]   Summary of Call: Pt would like to know if we have samples of insulin Initial call taken by: Drucie Ip,  October 15, 2007 8:38 AM  Follow-up for Phone Call        Pt uses Lantus we have samples will place in the fridge.  LMOVM informing pt. Follow-up by: Christen Bame CMA,  October 15, 2007 12:16 PM

## 2010-02-08 NOTE — Progress Notes (Signed)
Summary: Sample of Lantus   Phone Note Call from Patient Call back at [phone]   Summary of Call: would like a sample of lantus Initial call taken by: Drucie Ip,  August 01, 2006 8:36 AM  Follow-up for Phone Call        samples in refrig for her to pick up today Follow-up by: Elige Radon RN,  August 01, 2006 8:56 AM

## 2010-02-08 NOTE — Assessment & Plan Note (Signed)
Summary: f/u dm,df   Vital Signs:  Patient profile:   50 year old female Height:      63 inches Weight:      215 pounds BMI:     38.22 Temp:     98.4 degrees F oral Pulse rate:   93 / minute BP sitting:   182 / 96  (left arm) Cuff size:   regular  Vitals Entered By: Levert Feinstein LPN (October  3, 624THL 8:55 AM) CC: f/u dm Is Patient Diabetic? Yes Did you bring your meter with you today? No Pain Assessment Patient in pain? no        Primary Care Provider:  Mariana Arn  MD  CC:  f/u dm.  History of Present Illness: 1. DM - Taking Lantus 60 units q hs. (has been advised to titrate Lantus by one unit per day until fasting sugars were less than 100 but has not done so), Glucotrol XL 10 mg two times a day  and says she does not miss doses.  Fasting sugars in 190-200 range. Denies polyuria, polydipsia, vision change. Reports last eye doctor visit was over one year ago. Last foot exam in May. A1C 11.6 today, 11.0 at last visit in May 2011 at last check.  Was unable to tolerate metformin in the past. Poor diet. Exercise 2 times per week with walking one mile.   2) Vaginal itching: Reports vaginal itching, burning and dryness x 2 weeks. Sexually active. Uses condoms most days. Denies dysuria, discharge, fever, CVA tenderness.     Habits & Providers  Alcohol-Tobacco-Diet     Tobacco Status: never  Current Medications (verified): 1)  Bayer Childrens Aspirin 81 Mg Chew (Aspirin) .... Take 1 Tablet By Mouth Once A Day 2)  Glucotrol Xl 10 Mg Tb24 (Glipizide) .... Take 1 Tablet By Mouth Twice A Day 3)  Onetouch Ultra Test  Strp (Glucose Blood) .Marland Kitchen.. 1 Strip Every Morning 4)  Zyrtec Allergy 10 Mg Tabs (Cetirizine Hcl) .Marland Kitchen.. 1 Tablet By Mouth At Bedtime As Needed - Not Taking Much 5)  Carvedilol 12.5 Mg Tabs (Carvedilol) .... One Tab By Mouth Two Times A Day 6)  Vasotec 20 Mg  Tabs (Enalapril Maleate) .... Take 2 Tablets Once A Day 7)  Hydrochlorothiazide 25 Mg  Tabs (Hydrochlorothiazide)  .... Take One Tablet Daily. 8)  Lantus Solostar 100 Unit/ml  Soln (Insulin Glargine) .... Inject 60 Units Daily and Increase By 1 Unit Every Day As Long As Your Fasting Blood Sugar Is Greater Than 100. Disp 1 Month Supply 9)  Norvasc 10 Mg  Tabs (Amlodipine Besylate) .... Take One Tablet Daily 10)  Neurontin 300 Mg  Caps (Gabapentin) .... Take One Tablet By Mouth At Bedtime For Feet Pain 11)  Ultra Touch Pen Needles .... Dm: 250.00 Check Sugar Daily in Am.  Dispense One Month Supply 12)  Lipitor 40 Mg Tabs (Atorvastatin Calcium) .... One Tab By Mouth Qday 13)  Doxycycline Hyclate 100 Mg Cpep (Doxycycline Hyclate) .Marland Kitchen.. 1 By Mouth Two Times A Day For 10 Days. 14)  Ibuprofen 600 Mg Tabs (Ibuprofen) .Marland Kitchen.. 1 By Mouth Q6hr As Needed Pain. 15)  Fluconazole 150 Mg Tabs (Fluconazole) .... One Tab By Mouth X 1 Day (Repeat One Dose If Not Improving in Two - Three Days)  Allergies (verified): No Known Drug Allergies  Physical Exam  General:  obese, NAD  Lungs:  CTAB w/o wheeze or crackles, normal WOB  Heart:  RRR, no murmurs  Genitalia:  mild vaginal dryness  w/o atrophy thick white discharge noted  otherwise normal internal / external exam  Pulses:  2+ pedal   Diabetes Management Exam:    Foot Exam (with socks and/or shoes not present):       Sensory-Pinprick/Light touch:          Left medial foot (L-4): normal          Left dorsal foot (L-5): normal          Left lateral foot (S-1): normal          Right medial foot (L-4): normal          Right dorsal foot (L-5): normal          Right lateral foot (S-1): normal       Sensory-Monofilament:          Left foot: normal          Right foot: normal       Inspection:          Left foot: normal          Right foot: normal       Nails:          Left foot: normal          Right foot: normal   Impression & Recommendations:  Problem # 1:  VAGINAL PRURITUS (ICD-698.1) Consistent with yeast. Will treat with diflucan.  Orders: Wet [facility]- [place]  ([id number]) [place]- Est  Level 4 [id number])  Problem # 2:  DIABETES MELLITUS II, UNCOMPLICATED (XX123456)  Not at goal. Will have patient titrate up on Lantus for better control. Continue medications as below. A1C in three months.  Her updated medication list for this problem includes:    Bayer Childrens Aspirin 81 Mg Chew (Aspirin) .Marland Kitchen... Take 1 tablet by mouth once a day    Glucotrol Xl 10 Mg Tb24 (Glipizide) .Marland Kitchen... Take 1 tablet by mouth twice a day    Vasotec 20 Mg Tabs (Enalapril maleate) .Marland Kitchen... Take 2 tablets once a day    Lantus Solostar 100 Unit/ml Soln (Insulin glargine) ..... Inject 60 units daily and increase by 1 unit every day as long as your fasting blood sugar is greater than 100. disp 1 month supply  Orders: A1C-[facility] [id number]) [place]- Est  Level 4 [id number])  Labs Reviewed: Creat: 0.56 (04/22/2009)   Microalbumin: positive (trace) (07/02/2006)  Last Eye Exam: normal (09/08/2008) Reviewed HgBA1c results: 11.6 (10/11/2009)  11.0 (04/22/2009)  Complete Medication List: 1)  Bayer Childrens Aspirin 81 Mg Chew (Aspirin) .... Take 1 tablet by mouth once a day 2)  Glucotrol Xl 10 Mg Tb24 (Glipizide) .... Take 1 tablet by mouth twice a day 3)  Onetouch Ultra Test Strp (Glucose blood) .Marland Kitchen.. 1 strip every morning 4)  Zyrtec Allergy 10 Mg Tabs (Cetirizine hcl) .Marland Kitchen.. 1 tablet by mouth at bedtime as needed - not taking much 5)  Carvedilol 12.5 Mg Tabs (Carvedilol) .... One tab by mouth two times a day 6)  Vasotec 20 Mg Tabs (Enalapril maleate) .... Take 2 tablets once a day 7)  Hydrochlorothiazide 25 Mg Tabs (Hydrochlorothiazide) .... Take one tablet daily. 8)  Lantus Solostar 100 Unit/ml Soln (Insulin glargine) .... Inject 60 units daily and increase by 1 unit every day as long as your fasting blood sugar is greater than 100. disp 1 month supply 9)  Norvasc 10 Mg Tabs (Amlodipine besylate) .... Take one tablet daily 10)  Neurontin 300 Mg Caps (Gabapentin) .... Take one tablet by mouth at bedtime  for  feet pain 11)  Ultra Touch Pen Needles  .... Dm: 250.00 check sugar daily in am.  dispense one month supply 12)  Lipitor 40 Mg Tabs (Atorvastatin calcium) .... One tab by mouth qday 13)  Doxycycline Hyclate 100 Mg Cpep (Doxycycline hyclate) .Marland Kitchen.. 1 by mouth two times a day for 10 days. 14)  Ibuprofen 600 Mg Tabs (Ibuprofen) .Marland Kitchen.. 1 by mouth q6hr as needed pain. 15)  Fluconazole 150 Mg Tabs (Fluconazole) .... One tab by mouth x 1 day (repeat one dose if not improving in two - three days)  Other Orders: Flu Vaccine 60yrs + [id number]) Admin 1st Vaccine [id number]) Admin 1st Vaccine [facility]) [phone])  Patient Instructions: 1)  It was great to see you today!  2)  Increase your insulin (Lantus) by 10 units tomorrow, then by one unit each day until your fasting (first thing in the morning) sugars are below 130.  3)  Follow up with me in 1 month. 4)  Record your sugars and bring them in  5)  Start exercising daily. 6)  Stop drinking drinks with sugar in them - this includes sodas and fruit juice. HAVE MORE WATER! 7)  Take the fluconazole for a yeast infection. Prescriptions: FLUCONAZOLE 150 MG TABS (FLUCONAZOLE) one tab by mouth x 1 day (repeat one dose if not improving in two - three days)  #1 x 1   Entered and Authorized by:   Mariana Arn  MD   Signed by:   Mariana Arn  MD on 10/11/2009   Method used:   Electronically to        [facility] [phone]* (retail)       [address] [place], [place]  [address]       Ph: [phone]       Fax: [phone]   RxID:   [id number]   Laboratory Results   Blood Tests   Date/Time Received: October 11, 2009 8:47 AM  Date/Time Reported: October 11, 2009 9:28 AM   HGBA1C: 11.6%   (Normal Range: Non-Diabetic - 3-6%   Control Diabetic - 6-8%)  Comments: ...............test performed by......Marland KitchenBonnie A. Martinique, MLS (ASCP)cm  Date/Time Received: October 11, 2009 9:53 AM  Date/Time Reported: October 11, 2009 10:08  AM   Wet [place] Source: vag WBC/hpf: 10-20 Bacteria/hpf: 3+  Rods Clue cells/hpf: none  Negative whiff Yeast/hpf: many Trichomonas/hpf: none Comments: ...............test performed by......Marland KitchenBonnie A. Martinique, MLS (ASCP)cm       Influenza Vaccine    Vaccine Type: Fluvax 3+    Site: right deltoid    Mfr: GlaxoSmithKline    Dose: 0.5 ml    Route: IM    Given by: Levert Feinstein LPN    Exp. Date: 07/06/2010    Lot #: [id number]    VIS given: 08/03/09 version given October 11, 2009.  Flu Vaccine Consent Questions    Do you have a history of severe allergic reactions to this vaccine? no    Any prior history of allergic reactions to egg and/or gelatin? no    Do you have a sensitivity to the preservative Thimersol? no    Do you have a past history of Guillan-Barre Syndrome? no    Do you currently have an acute febrile illness? no    Have you ever had a severe reaction to latex? no    Vaccine information given and explained to patient?  yes    Are you currently pregnant? no   Prevention & Chronic Care Immunizations   Influenza vaccine: Fluvax 3+  (10/11/2009)   Influenza vaccine due: 10/18/2008    Tetanus booster: 01/09/2001: Done.   Tetanus booster due: 01/10/2011    Pneumococcal vaccine: Done.  (11/09/2004)   Pneumococcal vaccine due: None  Colorectal Screening   Hemoccult: Done.  (01/10/2004)   Hemoccult due: 01/09/2005    Colonoscopy: Not documented  Other Screening   Pap smear: normal  (05/26/2008)   Pap smear due: 05/27/2011    Mammogram: Not documented   Mammogram due: 11/26/2008   Smoking status: never  (10/11/2009)  Diabetes Mellitus   HgbA1C: 11.6  (10/11/2009)   Hemoglobin A1C due: 01/04/2009    Eye exam: normal  (09/08/2008)   Eye exam due: 09/2009    Foot exam: yes  (10/11/2009)   Foot exam action/deferral: Do today   High risk foot: Not documented   Foot care education: Not documented   Foot exam due: 05/26/2009    Urine  microalbumin/creatinine ratio: Not documented   Urine microalbumin/cr due: 07/02/2007  Lipids   Total Cholesterol: 192  (11/11/2007)   LDL: 125  (11/11/2007)   LDL Direct: 125  (05/26/2008)   HDL: 41  (11/11/2007)   Triglycerides: 131  (11/11/2007)   Lipid panel due: 07/22/2009    SGOT (AST): 13  (04/22/2009)   SGPT (ALT): 12  (04/22/2009)   Alkaline phosphatase: 87  (04/22/2009)   Total bilirubin: 0.3  (04/22/2009)   Liver panel due: 05/22/2009  Hypertension   Last Blood Pressure: 182 / 96  (10/11/2009)   Serum creatinine: 0.56  (04/22/2009)   BMP action: Ordered   Serum potassium 3.7  (0000000)   Basic metabolic panel due: 0000000  Self-Management Support :   Personal Goals (by the next clinic visit) :     Personal A1C goal: 9  (10/05/2008)     Personal blood pressure goal: 130/80  (10/05/2008)     Personal LDL goal: 70  (10/11/2009)    Diabetes self-management support: CBG self-monitoring log, Written self-care plan  (10/11/2009)   Diabetes care plan printed    Diabetes self-management support not done because: Good outcomes  (04/22/2009)    Hypertension self-management support: Written self-care plan, Education handout  (04/22/2009)    Lipid self-management support: Written self-care plan  (04/22/2009)

## 2010-02-08 NOTE — Letter (Signed)
Summary: Results Follow-up Letter  [facility]  [address]   [place], [place] [address]   Phone: [phone]  Fax: [phone]    04/26/2009  [place], [place]  [address]  Dear Ms. Scarantino,   The following are the results of your recent test(s):  Liver function tests: NORMAL Kidney function tests: NORMAL  Sincerely,  Mariana Arn  MD [facility]           Appended Document: Results Follow-up Letter mailed.

## 2010-02-08 NOTE — Progress Notes (Signed)
Summary: Insulin pin  Phone Note Call from Patient Call back at Work Phone [phone]   Summary of Call: Wants to discuss solar star insulin pin. Initial call taken by: Drucie Ip,  July 24, 2007 8:51 AM  Follow-up for Phone Call        wants to try the pen delivery system. told her we will ask md upon her return next week Follow-up by: Elige Radon RN,  July 24, 2007 9:47 AM  Additional Follow-up for Phone Call Additional follow up Details #1::        pt calling back, wants to know if we can find out from another md, pt is anxious to start it Additional Follow-up by: Samara Snide,  July 24, 2007 10:00 AM    Additional Follow-up for Phone Call Additional follow up Details #2::    left message that I checked with another md & we will have to wait for md to return Follow-up by: Elige Radon RN,  July 24, 2007 11:41 AM  Additional Follow-up for Phone Call Additional follow up Details #3:: Details for Additional Follow-up Action Taken: Called patient at work and left message for her to call back. I need more details about what she needs/wants so I can figure out if she needs it and prescribe it. Additional Follow-up by: Rolly Salter MD,  July 31, 2007 11:39 AM  STATES SHE HAS BEEN ON THE LANTUS SOLOSTART PENS. JUST NEEDED SOME SAMPLES. TOLD HER TO COME & PICK UP 2.Marland KitchenOke[facility] Gerturde Kuba RN  August 02, 2007 4:24 PM

## 2010-02-08 NOTE — Progress Notes (Signed)
Summary: triage  Phone Note Call from Patient Call back at Work Phone [phone]   Caller: Patient Summary of Call: Has 2 places on her private area that the doctor has seen and she did use a otc cream, but it did not work.  Can she get something called in for else called in for this.  Pharmacy is [facility]. Initial call taken by: Raymond Gurney,  June 10, 2009 8:34 AM  Follow-up for Phone Call        she had taken all of the antibiotic. 2 new spots per pt in same area. wants to have something called in instead of coming back in. tol;d her I will send this to Dr. Carlena Sax & will call her back with response Follow-up by: Elige Radon RN,  June 10, 2009 8:38 AM  Additional Follow-up for Phone Call Additional follow up Details #1::        will do 1 refill of doxycycline.  if not improving by monday needs to be seen on monday.  use warm compresses 3 times daily to area.   Additional Follow-up by: Patria Mane  MD,  June 10, 2009 8:43 AM    Additional Follow-up for Phone Call Additional follow up Details #2::    discussed md's response in detail. she agrees to call monday am if she does not see an improvement. told her warm,wet cloth to area three times a day. use showers not baths, cotton under wear & no tight pants. discussed A1Cs and diabetes. urged her to try to work on the diet & diabetes to get A1c down to 7 Follow-up by: Elige Radon RN,  June 10, 2009 9:05 AM  Prescriptions: DOXYCYCLINE HYCLATE 100 MG CPEP (DOXYCYCLINE HYCLATE) 1 by mouth two times a day for 10 days.  #20 x 0   Entered and Authorized by:   Patria Mane  MD   Signed by:   Patria Mane  MD on 06/10/2009   Method used:   Electronically to        [id number] (retail)       [address] [place], [place]  [address]       Ph: [phone]       Fax: [phone]   RxID:   [id number]

## 2010-02-08 NOTE — Progress Notes (Signed)
Summary: pls call  Phone Note Call from Patient Call back at Home Phone [phone]   Caller: Patient Summary of Call: would like to know if we have samples of Solarstar insulin pen Initial call taken by: Audie Clear,  December 17, 2007 8:40 AM  Follow-up for Phone Call        pt informed that no samples avalible Follow-up by: [facility] CMA,  December 17, 2007 11:26 AM

## 2010-02-08 NOTE — Progress Notes (Signed)
Summary: Birth Control  Phone Note Call from Patient Call back at [phone]   Summary of Call: pt states she just contacted [facility] and they didn't receive 90 day supply  Initial call taken by: Drucie Ip,  Jun 08, 2006 9:55 AM  Follow-up for Phone Call        Spoke with [facility] - they state that a 30 day supply of birth control was sent to pt - as previously prescribed.  Per [facility] - the best was to get pt a 72 supply of birth control is write new rx prescribing a 90 day supply, and have pt send it to [facility] when she has 2 weeks left of birth control pills. Will forward to her physician to change.  Follow-up by: AMY MARTIN RN,  Jun 08, 2006 10:25 AM  Additional Follow-up for Phone Call Additional follow up Details #1::        I eRx'd to [facility]; not sure why they didn't get it; please f/u thanks Additional Follow-up by: Lady Deutscher MD,  Jun 08, 2006 3:15 PM   Additional Follow-up for Phone Call Additional follow up Details #2::    Spoke with [facility], the disp # needed to be changed to 84 instead of 28.  Changed in [facility] and informed pt.  Pt to cll back if Rx is still not right. Follow-up by: Christen Bame CMA,  Jun 08, 2006 5:05 PM

## 2010-02-08 NOTE — Assessment & Plan Note (Signed)
Summary: cpe/pap,tcb   Vital Signs:  Patient profile:   50 year old female Height:      63 inches (160.02 cm) Weight:      202 pounds (91.82 kg) Temp:     97.7 degrees F (36.50 degrees C) Pulse rate:   76 / minute BP sitting:   174 / 87  Vitals Entered By: Christen Bame CMA (May 26, 2008 9:03 AM) CC: pap smear and DM Pain Assessment Patient in pain? no        Serial Vital Signs/Assessments:  Time      Position  BP       Pulse  Resp  Temp     By                     160/88                         Danise Mina MD  Comments: 9:04 AM Manual BP: 160/88 By: Christen Bame CMA  Manual BP recorded by nurse on yellow sheet By: Danise Mina MD   Mammogram Next Due:  6 mo   History of Present Illness: 50 yo with HTN, DM, obesity here for routine follow up and for pap smear  1. DM - Taking Lantus 55 units q hs. Also sometimes she will increase her lantus to 57 or 58 depending on her sugars. Glucotrol XL 10 mg two times a day  and says she does not miss doses.  Fasting sugars 160s. Checks sugars every other day. After she eats sugars are in the 220s per patient. Was unable to tolerate metformin in the past.  2. HTN - No CP,SOB, vison changes, HAs, edema. Takeing Metroprolol 100 two times a day, HCTZ, Enalapril 40 mg. Also on Norvasc 10 mg. Trying to watch her salt.   3. Contraception - LMP 5/13. Periods are regular. This month lasted 5 days. Last month was only for 1 day. No bleeding between periods. Only using condoms. Not taking micronor. Depo caused bleeding. Last went off depo in November. Husband is thinking about getting a vasectomy. No fear of STDs.  4. Obesity - Mainly eating baked stuff. Cutting back on salt. Walking 2-3 days a week for 30 min.   24 hour recall Bowl cereal (frosted mini wheats), 2% milk, orange juice,  Chips, soda - regular Dr. Malachi Bonds Lunch - tuna, lettuce, crackers (saltine crackers), soda (Dr. Malachi Bonds regular) Water, peaches canned with juice  Dinner - baked chicken, green beens, and potatoes (butter), water Slice of cake after dinner.     Habits & Providers     Tobacco Status: never  Allergies: No Known Drug Allergies  Family History:    Diabetes 1st degree - father and grandfather    No colon cancer or female cancer.    No CAD or CVAs.     No FH of breast cancer  Physical Exam  General:  alert, well-developed, and well-nourished.  obese vital signs reviewed  Breasts:  No mass, nodules, thickening, tenderness, bulging, retraction, inflamation, nipple discharge or skin changes noted.   Lungs:  normal respiratory effort, normal breath sounds, no crackles, and no wheezes.   Heart:  normal rate, regular rhythm, no murmur, no gallop, and no rub.   Genitalia:  Normal introitus for age, no external lesions, no vaginal discharge, mucosa pink and moist, no vaginal or cervical lesions, no vaginal atrophy, no friaility or hemorrhage, uterus size mildly enlarged by exam,  no adnexal masses or tenderness Extremities:  No edema Neurologic:  alert & oriented X3.    Diabetes Management Exam:    Foot Exam (with socks and/or shoes not present):       Sensory-Pinprick/Light touch:          Left medial foot (L-4): normal          Left dorsal foot (L-5): normal          Left lateral foot (S-1): normal          Right medial foot (L-4): normal          Right dorsal foot (L-5): normal          Right lateral foot (S-1): normal       Inspection:          Left foot: normal          Right foot: normal       Nails:          Left foot: normal          Right foot: normal   Impression & Recommendations:  Problem # 1:  DIABETES MELLITUS II, UNCOMPLICATED (XX123456) Assessment Deteriorated AIC is now 12.8 up from 11.8 Increase from 55 units to 60 units of lantus. Make appointment with pharmacy clinic for 1-2 weeks from now.  Decrease soda intake.   Her updated medication list for this problem includes:    Bayer Childrens Aspirin  81 Mg Chew (Aspirin) .Marland Kitchen... Take 1 tablet by mouth once a day    Glucotrol Xl 10 Mg Tb24 (Glipizide) .Marland Kitchen... Take 1 tablet by mouth twice a day    Vasotec 20 Mg Tabs (Enalapril maleate) .Marland Kitchen... Take 2 tablets once a day    Lantus Solostar 100 Unit/ml Soln (Insulin glargine) ..... Inject 60 units daily and increase by 1 unit every day as long as your fasting blood sugar is greater than 100. disp 1 month supply  Orders: A1C-[facility] [id number]) [place]- Est  Level 4 [id number])  Problem # 2:  HYPERTENSION, BENIGN SYSTEMIC (ICD-401.1) Assessment: Unchanged  BP still very elevated but on max BP med regimen. Will have patient seen at pharmacy clinic to get their input. Discussed  decreasing Na.   Her updated medication list for this problem includes:    Metoprolol Tartrate 100 Mg Tabs (Metoprolol tartrate) .Marland Kitchen... Take one tablet two times a day    Vasotec 20 Mg Tabs (Enalapril maleate) .Marland Kitchen... Take 2 tablets once a day    Hydrochlorothiazide 25 Mg Tabs (Hydrochlorothiazide) .Marland Kitchen... Take one tablet daily.    Norvasc 10 Mg Tabs (Amlodipine besylate) .Marland Kitchen... Take one tablet daily  Orders: [facility]- Est  Level 4 [id number])  Problem # 3:  HYPERLIPIDEMIA (ICD-272.4) Assessment: Unchanged  Orders: Direct LDL-[facility] [id number]) [place]- Est  Level 4 [id number])  Problem # 4:  ? of UTERINE ENLARGEMENT (ICD-621.2) Assessment: New Having some spotting but just stopped depo in November per patient. Will get [place] to ensure Uterus not enlarged.   Orders: Ultrasound (Ultrasound) [place]- Est  Level 4 [id number])  Problem # 5:  OBESITY, NOS (ICD-278.00) Assessment: Unchanged  24 hour recall discussed.  Decrease soda. Increase exercise. See patient instructions.   Orders: [facility]- Est  Level 4 [id number])  Problem # 6:  SCREENING FOR MALIGNANT NEOPLASM OF THE CERVIX (ICD-V76.2) Assessment: Unchanged  Orders: Pap Smear-[facility] [id number]) [place]- Est  Level 4 [id number])  Complete Medication List: 1)  Bayer Childrens Aspirin 81 Mg Chew (Aspirin) ....  Take 1 tablet by mouth  once a day 2)  Glucotrol Xl 10 Mg Tb24 (Glipizide) .... Take 1 tablet by mouth twice a day 3)  Onetouch Ultra Test Strp (Glucose blood) .Marland Kitchen.. 1 strip every morning 4)  Zyrtec Allergy 10 Mg Tabs (Cetirizine hcl) .Marland Kitchen.. 1 tablet by mouth at bedtime as needed - not taking much 5)  Metoprolol Tartrate 100 Mg Tabs (Metoprolol tartrate) .... Take one tablet two times a day 6)  Vasotec 20 Mg Tabs (Enalapril maleate) .... Take 2 tablets once a day 7)  Hydrochlorothiazide 25 Mg Tabs (Hydrochlorothiazide) .... Take one tablet daily. 8)  Lantus Solostar 100 Unit/ml Soln (Insulin glargine) .... Inject 60 units daily and increase by 1 unit every day as long as your fasting blood sugar is greater than 100. disp 1 month supply 9)  Norvasc 10 Mg Tabs (Amlodipine besylate) .... Take one tablet daily 10)  Neurontin 300 Mg Caps (Gabapentin) .... Take one tablet by mouth at bedtime for feet pain 11)  Ultra Touch Pen Needles  .... Dm: 250.00 check sugar daily in am.  dispense one month supply  Patient Instructions: 1)  Please make an appointment to see Dr. Valentina Lucks at the pharmacy clinic in the next 1-2 weeks about your diabetes and blood pressure. 2)  Have your blood pressure checked when you see him as well. 3)  Sodium = salt on labels. Try to avoid this. 4)  Increase your water. 5)  Decrease your soda intake.  6)  If you drink soda drink diet.  7)  Increase your exercise to 4 days a week 30 min per day. 8)  Increase your lantus to 60 units every night. 9)  We are checking a transvaginal/pelvic ultrasound to make sure your uterus is normal.  10)  We are also checking your bad cholesterol today.  11)  You are due for a mammogram in November.  12)  Make an appointment to see me after you see pharmacy clinic.  Laboratory Results   Blood Tests   Date/Time Received: May 26, 2008 8:57 AM  Date/Time Reported: May 26, 2008 9:12 AM   HGBA1C: 12.8%   (Normal Range: Non-Diabetic - 3-6%    Control Diabetic - 6-8%)  Comments: ...............test performed by......Marland KitchenBonnie A. Martin[place], [place] (ASCP)

## 2010-02-08 NOTE — Assessment & Plan Note (Signed)
Summary: flu shot,tcb  Nurse Visit   Vital Signs:  Patient profile:   50 year old female Temp:     98.3 degrees F  Vitals Entered By: Marcell Barlow RN (November 20, 2008 9:43 AM)  Allergies: No Known Drug Allergies  Immunizations Administered:  Influenza Vaccine # 1:    Vaccine Type: Fluvax Non-MCR    Site: right deltoid    Mfr: GlaxoSmithKline    Dose: 0.5 ml    Route: IM    Given by: Marcell Barlow RN    Exp. Date: 07/08/2009    Lot #: [id number]    VIS given: 08/18/2008  Flu Vaccine Consent Questions:    Do you have a history of severe allergic reactions to this vaccine? no    Any prior history of allergic reactions to egg and/or gelatin? no    Do you have a sensitivity to the preservative Thimersol? no    Do you have a past history of Guillan-Barre Syndrome? no    Do you currently have an acute febrile illness? no    Have you ever had a severe reaction to latex? no    Vaccine information given and explained to patient? yes    Are you currently pregnant? no  Orders Added: 1)  Influenza Vaccine NON MCR [00028] 2)  Admin 1st Vaccine [id number]

## 2010-02-08 NOTE — Progress Notes (Signed)
Summary: samples  Phone Note Call from Patient Call back at Work Phone [phone]   Reason for Call: Talk to Nurse Summary of Call: pt is requesting samples of her insulin pin (lantus) Initial call taken by: ERIN LEVAN,  November 06, 2006 9:07 AM  Follow-up for Phone Call        I do not see lantus on her med list.  Is she currently taking this?  How much and when? Follow-up by: Christen Bame CMA,  November 06, 2006 9:11 AM  Additional Follow-up for Phone Call Additional follow up Details #1::        Pt sts Dr. Milinda Antis prescribed this, it was the insulin pin Additional Follow-up by: ERIN LEVAN,  November 06, 2006 2:12 PM    Additional Follow-up for Phone Call Additional follow up Details #2::    According to [facility] note in June 2008, she increased her lantus to 40 units at bedtime so she is supposed to be on Lantus. Please provide her with sample medications if we have them but ask her how much lantus she is actually taking. She needs to have an appointment with me to follow up for her diabetes and check her AIC. Please have her schedule an appointment. Thanks.  Follow-up by: Rolly Salter MD,  November 07, 2006 10:26 AM  Additional Follow-up for Phone Call Additional follow up Details #3:: Details for Additional Follow-up Action Taken: Pt is taking 40units @ bedtime still.  Advised that we do have samples and that she could come to get them.  Pt has appt with MD on 11.7.08. Additional Follow-up by: [facility] CMA,  November 07, 2006 12:20 PM

## 2010-02-08 NOTE — Assessment & Plan Note (Signed)
Summary: routine visit/el   Vital Signs:  Patient Profile:   50 Years Old Female Height:     64 inches (162.56 cm) Weight:      188 pounds (85.45 kg) BMI:     32.39 Temp:     98.4 degrees F (36.89 degrees C) Pulse rate:   81 / minute BP sitting:   173 / 90  (left arm)  Pt. in pain?   no  Vitals Entered By: Arnette Schaumann RN (April 17, 2007 9:14 AM)                  PCP:  Rolly Salter MD  Chief Complaint:  f/u DM and BP.  History of Present Illness: 50 yo with HTN, DM, obesity here for routine follow up.  1. DM - Taking Lantus 42 units q hs and Glucotrol XL 10 mg BID and says she does not miss doses. [facility] care [facility]. 140s in AM and 180-200 in PM.   2. HTN - No CP,SOB, vison changes, HAs, edema. Takeing Metroprolol 100 two times a day, HCTZ, Enalapril 40 mg. BP still high today.   3. Contraception - Taking birth contol pills and would like refill.   4. Sometimes has numbness in feet and hands - sees podiatrist. They have given her some shots in her feet. Has not ever been on neurontin.      Current Allergies: No known allergies       Physical Exam  General:     alert, well-developed, and well-nourished.   Eyes:     Did fundoscopic exam but will defer to optho.  Lungs:     normal respiratory effort, normal breath sounds, no crackles, and no wheezes.   Heart:     normal rate, regular rhythm, no murmur, no gallop, and no rub.   Abdomen:     soft, non-tender, and no distention.   Extremities:     no edema. Normal sensation feet. No lesions on feet. Dorsalis pedis pulses 2+ Psych:     normally interactive.      Impression & Recommendations:  Problem # 1:  HYPERTENSION, BENIGN SYSTEMIC (ICD-401.1) Assessment: Unchanged Still uncontrolled. Will add norvasc 5 mg daily. Check BMET. Stop OCPs.   Her updated medication list for this problem includes:    Metoprolol Tartrate 100 Mg Tabs (Metoprolol tartrate) .Marland Kitchen... Take one tablet two times a day    Vasotec 20 Mg Tabs (Enalapril maleate) .Marland Kitchen... Take 2 tablets once a day and make an appointment with our clinic.    Hydrochlorothiazide 25 Mg Tabs (Hydrochlorothiazide) .Marland Kitchen... Take one tablet daily.    Norvasc 5 Mg Tabs (Amlodipine besylate) .Marland Kitchen... Take one tablet by mouth daily  Orders: Basic Met-[facility] [id number]) [place]- Est  Level 4 [id number])   Problem # 2:  DIABETES MELLITUS II, UNCOMPLICATED (XX123456) Assessment: Deteriorated Continue below meds. Increase Lantus by 1 unit every morning fasting sugar >100. AIC today 11.6 up from 10.8. Foot tingling may be due to neuropathy - start Neurontin.   Her updated medication list for this problem includes:    Bayer Childrens Aspirin 81 Mg Chew (Aspirin) .Marland Kitchen... Take 1 tablet by mouth once a day    Glucotrol Xl 10 Mg Tb24 (Glipizide) .Marland Kitchen... Take 1 tablet by mouth twice a day    Vasotec 20 Mg Tabs (Enalapril maleate) .Marland Kitchen... Take 2 tablets once a day and make an appointment with our clinic.    Lantus Solostar 100 Unit/ml Soln (Insulin glargine) .Marland KitchenMarland KitchenMarland KitchenMarland Kitchen  Inject 42 units daily and increase by 1 unit every 2 days as long as your fasting blood sugar is greater than 120. disp 1 month supply  Orders: A1C-[facility] [id number]) [place]- Est  Level 4 [id number])   Problem # 3:  CONTRACEPTIVE MANAGEMENT (ICD-V25.09) Assessment: New Stop OCPs due to hypertension. Also is around the age of menopause. Will use condoms and see if she has menses. If she is not menopausal we can discuss options in 1 month.  Orders: [facility]- Est  Level 4 [id number])   Complete Medication List: 1)  Bayer Childrens Aspirin 81 Mg Chew (Aspirin) .... Take 1 tablet by mouth once a day 2)  Glucotrol Xl 10 Mg Tb24 (Glipizide) .... Take 1 tablet by mouth twice a day 3)  Onetouch Ultra Test Strp (Glucose blood) .Marland Kitchen.. 1 strip every morning 4)  Zyrtec Allergy 10 Mg Tabs (Cetirizine hcl) .Marland Kitchen.. 1 tablet by mouth at bedtime as needed - not taking much 5)  Metoprolol Tartrate 100 Mg Tabs (Metoprolol tartrate) .... Take one  tablet two times a day 6)  Vasotec 20 Mg Tabs (Enalapril maleate) .... Take 2 tablets once a day and make an appointment with our clinic. 7)  Hydrochlorothiazide 25 Mg Tabs (Hydrochlorothiazide) .... Take one tablet daily. 8)  Lantus Solostar 100 Unit/ml Soln (Insulin glargine) .... Inject 42 units daily and increase by 1 unit every 2 days as long as your fasting blood sugar is greater than 120. disp 1 month supply 9)  Norvasc 5 Mg Tabs (Amlodipine besylate) .... Take one tablet by mouth daily 10)  Neurontin 300 Mg Caps (Gabapentin) .... Take one tablet by mouth at bedtime for feet pain   Patient Instructions: 1)  Come back to see [place] in 1 month. 2)  Stop taking your birth control pills. You should not be on them with your blood pressure being so high. Use condoms for a few months. It is possible that you could be menopausal and the birthcontrol is causing you to still have your period. We can talk about another method of birth control in a few months if you are still having periods. 3)  I am starting a new medicine for your bloood pressure - Norvasc 5mg . Take one tablet daily. 4)  You need to schedule a mammogram.  5)  Try Neurontin for your leg pain. 6)  Increase your Lantus by 1 unit every day that your morning sugar is >100. Take 43 units this evening. 7)  We will check your [facility] and your electrolytes and kidneys today.     Prescriptions: NEURONTIN 300 MG  CAPS (GABAPENTIN) Take one tablet by mouth at bedtime for feet pain  #30 x 1   Entered and Authorized by:   Rolly Salter MD   Signed by:   Rolly Salter MD on 04/17/2007   Method used:   Print then Give to Patient   [id number] LANTUS SOLOSTAR 100 UNIT/ML  SOLN (INSULIN GLARGINE) Inject 42 units daily and increase by 1 unit every 2 days as long as your fasting blood sugar is greater than 120. Disp 1 month supply  #3 x 1   Entered and Authorized by:   Rolly Salter MD   Signed by:   Rolly Salter MD on 04/17/2007    Method used:   Print then Give to Patient   [id number] HYDROCHLOROTHIAZIDE 25 MG  TABS (HYDROCHLOROTHIAZIDE) Take one tablet daily.  #90 x 0   Entered and Authorized by:  Nishant Schrecengost HOBBS MD   Signed by:   Rolly Salter MD on 04/17/2007   Method used:   Print then Give to Patient   RxID:   [id number] VASOTEC 20 MG  TABS (ENALAPRIL MALEATE) Take 2 tablets once a day and make an appointment with our clinic.  #90 x 0   Entered and Authorized by:   Rolly Salter MD   Signed by:   Rolly Salter MD on 04/17/2007   Method used:   Print then Give to Patient   [id number] METOPROLOL TARTRATE 100 MG  TABS (METOPROLOL TARTRATE) Take one tablet two times a day  #90 x 0   Entered and Authorized by:   Rolly Salter MD   Signed by:   Rolly Salter MD on 04/17/2007   Method used:   Print then Give to Patient   RxID:   [id number] GLUCOTROL XL 10 MG TB24 (GLIPIZIDE) Take 1 tablet by mouth twice a day  #90 x 0   Entered and Authorized by:   Rolly Salter MD   Signed by:   Rolly Salter MD on 04/17/2007   Method used:   Print then Give to Patient   [id number] NORVASC 5 MG  TABS (AMLODIPINE BESYLATE) Take one tablet by mouth daily  #90 x 0   Entered and Authorized by:   Rolly Salter MD   Signed by:   Rolly Salter MD on 04/17/2007   Method used:   Print then Give to Patient   [id number]  ]

## 2010-02-08 NOTE — Miscellaneous (Signed)
Summary: Procedures Consent  Procedures Consent   Imported By: Audie Clear 05/28/2009 16:39:38  _____________________________________________________________________  External Attachment:    Type:   Image     Comment:   External Document

## 2010-02-08 NOTE — Letter (Signed)
Summary: Lipid Letter  [facility]  [address]   [place], [place] [address]   Phone: [phone]  Fax: [phone]    11/20/2007  Rachel Chaney [address] [place], [place]  [address]  Dear [name]:  Your labs from 11/11/07 were normal except for having high cholesterol and blood sugar. Your bad cholesterol (LDL) is elevated. Since you are diabetic, this number needs to be <100 to help prevent things like heart disease. I would suggest that you start a medication called Zocor for your cholesterol to decrease this number as well as decreasing the fat/cholesterol/saturated fats in your diet and increasing your exercise. If you would prefer to try changing your diet and exercising more first for a few months and then add the Zocor, this would be ok as well. Please call our office and let us know what you would like.     Cholesterol:       192     Goal: <200   HDL "good" Cholesterol:   41     Goal: >40   LDL "bad" Cholesterol:   125     Goal: <100   Triglycerides:       131     Goal: <150  If you have any questions, please call. We appreciate being able to work with you.   Sincerely,    [facility] Danise Mina MD  Appended Document: Lipid Letter mailed

## 2010-02-08 NOTE — Progress Notes (Signed)
Summary: Needles for Lantus Solostar Pen  Phone Note Call from Patient Call back at Home Phone [phone]   Caller: Patient Summary of Call: needs to know if we have any needles for the Lantus pen?  Initial call taken by: Audie Clear,  March 01, 2009 9:02 AM  Follow-up for Phone Call        Attempted Follow-Up at 2:50 - left message to return direct call to [phone].  Follow-up by: Janeann Forehand, PharmD  Additional Follow-up for Phone Call Additional follow up Details #1::        Patient returned phone call and stated she needed just enough needles to 2-3 weeks.  She thinks she can cover the cost next moth.   She was supplied 30 pen needles from sample supply.  (BD Ultra fine 18mm).   Additional Follow-up by: Nicholas Lose , PharmD

## 2010-02-08 NOTE — Assessment & Plan Note (Signed)
Summary: SEEING Jamira Barfuss @9  AM / [facility]   Vital Signs:  Patient Profile:   50 Years Old Female Height:     64 inches Weight:      191 pounds BMI:     32.90  Vitals Entered By: Iver Nestle PHD (April 02, 2006 10:29 AM)               History of Present Illness: Assessment:  Ms. Tepe has been trying to eat 3 meals a day, but sometimes skips bkfast, and more often skips lunch.  24-hr recall suggests kcal intake of less than 800 kcal; yesterday's intake was limited to one meal at mid-day (Easter Sunday) and pie with ice cream at 6 PM.  Usual eating pattern includes 12 oz diet soda and 8 oz orange juice/day.  She eats lunch out about twice a week.  Usual exercise routine includes walking  ~30 min twice a week.  Ms. Gladney has a hx of foot pain, and is cautious re. increasing walking for that reason, but admits her limited exercise is mostly from lack of motivation.  Current diet is inadequate in veg's and fruit, excessive for sodium, and although generally low in kcal, it is proportionally high in saturated fat.  Nutrition Diagnosis:   Excessive sodium intake (NI-55.2 ) related to convenience and fast foods as evidenced by diet history.  Inappropriate intake of types of carbohydrate (NI-53.3 ) and inadequate fiber intake (NI-53.5) related to use of white bread and other refined CHOs as evidenced by diet history.  Physical inactivity (NB-2.1) related to infrequent exercise pattern as evidenced by reported twice weekly walking.    Intervention:   1. Walk or other exercise for at least 30 min 3-5 X wk.  (Recommended calling [employer] to inquire re. water exercise classes as well as scholarships.) 2. At least 3 meals and 1-2 snacks/day; no more than 5 hr between eating.  3. Obtain twice as many veg's as either protein or starch foods at both L and D meals.  Reviewed sources of each, and encouraged use of fresh/frozen veg's.   4. Aim for at least 8 c water/day.  5. Limit sodium to 2000 mg/day, and 1500 mg  is even better.  Recommended tracking daily Na intake for next week or two.    Monitoring/Eval:   Monitor weight and dietary intake at F/U, 3 weeks.  Pt will call for appt.    NOTE:  At F/U, address 8 oz juice/day.

## 2010-02-08 NOTE — Progress Notes (Signed)
Summary: triage  Phone Note Call from Patient Call back at Work Phone [phone]   Caller: Patient Summary of Call: has a bump on vagina and wants to know what she can do for it after taking Flagyl Initial call taken by: Audie Clear,  May 17, 2009 8:56 AM  Follow-up for Phone Call        she states she has 2 small itchy bumps in hair near labia. does not have any money for the copay until friday. wanted to know what else she should do. suggested warm washcloth to area or take tub baths. wear cotton underwear & skirts. avoid tight pants. if still present Thursday , call for fri appt. states she will have money friday wanted to know if the flagyl was the cause. told her unlikely as reax to meds would udually be more widespead than 2 small bumps. to pcp Follow-up by: Elige Radon RN,  May 17, 2009 9:01 AM

## 2010-02-08 NOTE — Progress Notes (Signed)
Summary: needs meds  Phone Note Call from Patient Call back at Home Phone [phone]   Caller: Patient Summary of Call: having problems w/ back and has been on her cycle x over a week - Depo not sure if it's coming from that and needs to know what to do. The back is what she is concerned about because it's so painful.  wants something for pain [facility] Initial call taken by: Audie Clear,  January 17, 2008 8:38 AM  Follow-up for Phone Call        is calling again due to pain, can be reached at [phone] Follow-up by: Drucie Ip,  January 17, 2008 10:11 AM  Additional Follow-up for Phone Call Additional follow up Details #1::        started depo 11/09. states her period has been on for 1 week now & she has back pain. wanted something called in. told her she will need to be seen. declined appt. she is going to try ibuprofen with food. reminded her when her next depo was due-she was not sure. to call back if she changes her mind & decides she wants to be seen Additional Follow-up by: Elige Radon RN,  January 17, 2008 10:20 AM

## 2010-03-03 ENCOUNTER — Ambulatory Visit: Payer: Self-pay | Admitting: Family Medicine

## 2010-03-15 ENCOUNTER — Other Ambulatory Visit: Payer: Self-pay | Admitting: Family Medicine

## 2010-03-15 NOTE — Telephone Encounter (Signed)
Refill request

## 2010-03-17 NOTE — Telephone Encounter (Signed)
Please review and refill

## 2010-04-13 ENCOUNTER — Other Ambulatory Visit: Payer: Self-pay | Admitting: Family Medicine

## 2010-04-13 NOTE — Telephone Encounter (Signed)
Refill request

## 2010-04-27 ENCOUNTER — Ambulatory Visit (INDEPENDENT_AMBULATORY_CARE_PROVIDER_SITE_OTHER): Payer: 59 | Admitting: Family Medicine

## 2010-04-27 VITALS — BP 173/100 | HR 93 | Temp 98.1°F | Wt 212.0 lb

## 2010-04-27 DIAGNOSIS — K219 Gastro-esophageal reflux disease without esophagitis: Secondary | ICD-10-CM | POA: Insufficient documentation

## 2010-04-27 DIAGNOSIS — E119 Type 2 diabetes mellitus without complications: Secondary | ICD-10-CM

## 2010-04-27 DIAGNOSIS — N912 Amenorrhea, unspecified: Secondary | ICD-10-CM

## 2010-04-27 DIAGNOSIS — I10 Essential (primary) hypertension: Secondary | ICD-10-CM

## 2010-04-27 LAB — POCT GLYCOSYLATED HEMOGLOBIN (HGB A1C): Hemoglobin A1C: 12.3

## 2010-04-27 MED ORDER — AMLODIPINE BESYLATE 10 MG PO TABS: 10.0000 mg | ORAL_TABLET | Freq: Every day | ORAL | Status: DC

## 2010-04-27 NOTE — Assessment & Plan Note (Signed)
Deteriorated. Advised regarding importance of regular follow up for this issue. Advised to continue to titrate up her dose of Lantus for goal CBG < 120 fasting. Advised regarding dietary changes and continuing to increase activity. Follow up with ophthalmology as scheduled. Follow in one month.

## 2010-04-27 NOTE — Progress Notes (Signed)
  Subjective:    Patient ID: Rachel Chaney, female    DOB: 06-05-1960, 50 y.o.   MRN: [id number]  HPI  1) Diabetes Mellitus type 2: Last A1c = 11.6 in October 2011. Per my instructions, she has increased her Lantus by 1 unit per day from 60 units daily to 70 units daily. Fasting sugars 140's. Walking 4 days a week for exercise. Last seen by ophthalmologist in December 2011 - noted to have diabetic retinopathy - treated laser therapy. Has been cutting back on refined carbohydrates. Has increased walking to 4 times per week.   2) HTN: BP at pharmacy = 140's / 90's. Has been reducing salt intake. Denies chest pain, dyspnea, headache, LE edema. Increased exercise as above. Taking Coreg and Enalapril and HCTZ, but not taking Norvasc.   3) Acid reflux: Reports occasional substernal burning, acid taste in mouth, worse after spicy foods and worse with lying flat. Last meal at night is around 9 PM. Takes OTC prevacid with relief of symptoms. Denies melena, hematochezia, nausea, emesis, hematemesis, wheeze, cough  4) Amenorrhea: No menstruation x 6 months. Had been increasing irregular in year preceding. Reports hot flashes. Denies fatigue, irritability, vaginal dryness. Not taking anything at this time for symptoms.   Reviewed pertinent past medical history.  Review of Systems As per HPI     Objective:   Physical Exam General: obese, pleasant, NAD  Cardiovascular: regular rate and rhythm, no murmurs, no JVD, no LE edema Abdomen: soft, nontender, no masses palpated, +BS  Feet: Sensation intact with diabetic foot exam. No calluses. + loss of bilateral transverse arches       Assessment & Plan:

## 2010-04-27 NOTE — Assessment & Plan Note (Signed)
Not at goal. Suspect non-compliance with medications. Advised regarding salt restriction, increasing activity. Follow up in one month. Advised to take all medications as prescribed.

## 2010-04-27 NOTE — Assessment & Plan Note (Signed)
Advised regarding trigger avoidance. Ok to use OTC prevacid as needed. Follow symptoms. No red flags.

## 2010-04-27 NOTE — Assessment & Plan Note (Signed)
Likely secondary to menopause. Symptoms manageable at this time without pharmacotherapy. Will follow.

## 2010-04-27 NOTE — Patient Instructions (Addendum)
Follow up in one month to see how your blood pressure is doing Start taking amlodipine for blood pressure. I am glad that you started walking more - wear your inserts to help prevent pain. You can take Tylenol for pain. Increase your Lantus by 1 unit per day until your fasting morning blood sugar is below 120 then stop increasing and take that amount of Lantus daily.  You are probably in menopause - let me know at your next appointment how your symptoms are doing.

## 2010-04-28 ENCOUNTER — Encounter: Payer: Self-pay | Admitting: Family Medicine

## 2010-06-09 ENCOUNTER — Other Ambulatory Visit: Payer: Self-pay | Admitting: Family Medicine

## 2010-06-10 NOTE — Telephone Encounter (Signed)
Refill request

## 2010-06-16 ENCOUNTER — Other Ambulatory Visit: Payer: Self-pay | Admitting: Family Medicine

## 2010-06-16 NOTE — Telephone Encounter (Signed)
Refill request

## 2010-07-20 ENCOUNTER — Telehealth: Payer: Self-pay | Admitting: Family Medicine

## 2010-07-20 NOTE — Telephone Encounter (Signed)
Will forward to new PCP, but pt will most likely need an appt Rachel Chaney, Rachel Chaney

## 2010-07-20 NOTE — Telephone Encounter (Signed)
If I have not prescribed it before would like pt to come in an be seen before I give it to her.

## 2010-07-20 NOTE — Telephone Encounter (Signed)
Pt informed and when attempted to schedule an appt pt states that she has a busy schedule and will have to get back to [place] on an appt.  Reiterated that no meds would be given until appt. Jariya Reichow, Salome Spotted

## 2010-07-20 NOTE — Telephone Encounter (Signed)
Was prescribed Cyclobenzaprine at Urgent Care and would like another refill if possible.

## 2010-08-25 ENCOUNTER — Other Ambulatory Visit: Payer: Self-pay | Admitting: Family Medicine

## 2010-08-25 NOTE — Telephone Encounter (Signed)
Refill request

## 2010-08-26 ENCOUNTER — Telehealth: Payer: Self-pay | Admitting: Family Medicine

## 2010-08-26 NOTE — Telephone Encounter (Signed)
Pt is requesting sample of a Lantus pen until her rx is sent to pharmacy.  Please at work # to let her know if she pick up today

## 2010-09-15 ENCOUNTER — Other Ambulatory Visit: Payer: Self-pay | Admitting: Family Medicine

## 2010-09-15 NOTE — Telephone Encounter (Signed)
Refill request

## 2010-09-22 NOTE — Telephone Encounter (Signed)
Have this been resolved yet?

## 2010-10-04 ENCOUNTER — Other Ambulatory Visit: Payer: Self-pay | Admitting: Family Medicine

## 2010-10-04 NOTE — Telephone Encounter (Signed)
Refill request

## 2010-10-14 ENCOUNTER — Ambulatory Visit (INDEPENDENT_AMBULATORY_CARE_PROVIDER_SITE_OTHER): Payer: 59 | Admitting: Family Medicine

## 2010-10-14 ENCOUNTER — Encounter: Payer: Self-pay | Admitting: Family Medicine

## 2010-10-14 VITALS — BP 188/96 | HR 89 | Temp 98.2°F | Wt 215.6 lb

## 2010-10-14 DIAGNOSIS — I10 Essential (primary) hypertension: Secondary | ICD-10-CM

## 2010-10-14 DIAGNOSIS — N912 Amenorrhea, unspecified: Secondary | ICD-10-CM

## 2010-10-14 DIAGNOSIS — E119 Type 2 diabetes mellitus without complications: Secondary | ICD-10-CM

## 2010-10-14 DIAGNOSIS — Z23 Encounter for immunization: Secondary | ICD-10-CM

## 2010-10-14 LAB — CBC
HCT: 37.1 % (ref 36.0–46.0)
Hemoglobin: 11.5 g/dL — ABNORMAL LOW (ref 12.0–15.0)
MCH: 27.1 pg (ref 26.0–34.0)
MCHC: 31 g/dL (ref 30.0–36.0)
MCV: 87.3 fL (ref 78.0–100.0)
Platelets: 345 10*3/uL (ref 150–400)
RBC: 4.25 MIL/uL (ref 3.87–5.11)
RDW: 13.5 % (ref 11.5–15.5)
WBC: 17.8 10*3/uL — ABNORMAL HIGH (ref 4.0–10.5)

## 2010-10-14 LAB — POCT GLYCOSYLATED HEMOGLOBIN (HGB A1C): Hemoglobin A1C: 12.5

## 2010-10-14 LAB — POCT URINE PREGNANCY: Preg Test, Ur: NEGATIVE

## 2010-10-14 LAB — TSH: TSH: 1.097 u[IU]/mL (ref 0.350–4.500)

## 2010-10-14 MED ORDER — INSULIN GLARGINE 100 UNIT/ML ~~LOC~~ SOLN: 75.0000 [IU] | SUBCUTANEOUS | Status: DC

## 2010-10-14 MED ORDER — ENALAPRIL MALEATE 20 MG PO TABS: 40.0000 mg | ORAL_TABLET | Freq: Every day | ORAL | Status: DC

## 2010-10-14 NOTE — Assessment & Plan Note (Signed)
Patient with irregular bleeding in perimenopausal period. She has only had one episode of extended bleeding likely anovulatory. Today we'll check a CBC because she is worried about becoming anemic. We'll also check a TSH and a urine pregnancy to be thorough. Patient may need an endometrial biopsy in the future.

## 2010-10-14 NOTE — Patient Instructions (Signed)
It was nice to see you today. Please make appointment to see Dr. Tamala Julian in 2 weeks to recheck your blood pressure and talk about your sugars Please bring your sugar logs with you to that appointment. He can also review how your menstrual bleeding has been going. I think you are very close to going through menopause, and irregular bleeding can be normal during this period. Please call for me, Dr. Charlett Blake, if you would like to discuss your bleeding before your visit with Dr. Tamala Julian. I will let you know if any of your lab tests are abnormal.

## 2010-10-14 NOTE — Assessment & Plan Note (Signed)
BP: 188/96 mmHg  Patient's blood pressure is significantly over goal. She reports that when she checked at home her blood pressures were better when she was taking her enalapril. I am restarting her enalapril today and she will be rechecked in 2 weeks.

## 2010-10-14 NOTE — Assessment & Plan Note (Signed)
Patient's A1c today is 12.5. She is taking her Lantus 70 units t in the morning. She takes her CBG in the morning and it is usually between 140-170. I recommended that she split her dose and to 50 units and 25 units with increase of 5 units in her Lantus. Patient to followup in 2 weeks with Dr. Tamala Julian for recheck. She was told to bring her sugar logs to that visit.

## 2010-10-14 NOTE — Progress Notes (Signed)
  Subjective:    Patient ID: Rachel Chaney, female    DOB: 01/31/1960, 50 y.o.   MRN: [id number]  HPI  Patient presents today for followup of irregular bleeding, as well as her diabetes and hypertension. Abnormal bleeding-patient states that she's had 7 months without any periods. She said that in August she had one day of bleeding and she reversed in July she also had one day of bleeding. She says that for the entire month of September she is having light spotting. She also has some cramping and back pain with spotting. Her last 2 days she has not been bleeding. She denies any chest pain or shortness of breath. She denies any discharge or odor. She is having sex without using any kind of protection. She is open to using condoms for protection  Diabetes-patient states that she takes her Lantus 70 units in the morning. She occasionally checks her blood sugars in the morning and they are usually between 140 and 170. She states that the lowest her blood sugar average is 120. She often has sugars in the 200s in the afternoon.  Hypertension-patient states that she's been out of her enalapril for several weeks however, when she takes it her blood pressures are much better. She states that she is taking her other blood pressure medicines regularly. She denies any headache, chest pain, or shortness of breath.   Review of Systems Denies CP, SOB, HA, N/V/D, fever     Objective:   Physical Exam  Vital signs reviewed General appearance - alert, well appearing, and in no distress and oriented to person, place, and time Heart - normal rate, regular rhythm, normal S1, S2, no murmurs, rubs, clicks or gallops Chest - clear to auscultation, no wheezes, rales or rhonchi, symmetric air entry, no tachypnea, retractions or cyanosis Extremities - peripheral pulses normal, no pedal edema, no clubbing or cyanosis       Assessment & Plan:  Abnormal perimenopausal bleeding Patient with irregular bleeding in  perimenopausal period. She has only had one episode of extended bleeding likely anovulatory. Today we'll check a CBC because she is worried about becoming anemic. We'll also check a TSH and a urine pregnancy to be thorough. Patient may need an endometrial biopsy in the future.  DIABETES MELLITUS II, UNCOMPLICATED Patient's [id number] today is 12.5. She is taking her Lantus 70 units t in the morning. She takes her CBG in the morning and it is usually between 140-170. I recommended that she split her dose and to 50 units and 25 units with increase of 5 units in her Lantus. Patient to followup in 2 weeks with Dr. Tamala Julian for recheck. She was told to bring her sugar logs to that visit.  HYPERTENSION, BENIGN SYSTEMIC BP: 188/96 mmHg  Patient's blood pressure is significantly over goal. She reports that when she checked at home her blood pressures were better when she was taking her enalapril. I am restarting her enalapril today and she will be rechecked in 2 weeks.

## 2010-10-18 ENCOUNTER — Encounter: Payer: Self-pay | Admitting: Family Medicine

## 2010-10-27 ENCOUNTER — Ambulatory Visit: Payer: 59 | Admitting: Family Medicine

## 2010-11-03 ENCOUNTER — Telehealth: Payer: Self-pay | Admitting: Family Medicine

## 2010-11-03 NOTE — Telephone Encounter (Signed)
Pt states that Dr. Charlett Blake told her that she may be able to give her something to control the bleeding if she had a heavy one again.  Pt states she is now having a heavy one with "terrible back pain"  Advised I would send a message to MD .Clinton Sawyer, Salome Spotted

## 2010-11-03 NOTE — Telephone Encounter (Signed)
Pt informed and appt made for 10.31.12 to discuss bleeding.  Advised to keep appt on 11.5.12 for DM check and physical Leanza Shepperson, Salome Spotted

## 2010-11-03 NOTE — Telephone Encounter (Signed)
Pt was supposed to follow up on 10/18 with Dr. Tamala Julian and cancelled.  Given that she is having pain and had uncontrolled BP at last visit, better for her to come in and be rechecked.  Very important for her to follow up when possible since we aren't sure why she is having the bleeding.

## 2010-11-03 NOTE — Telephone Encounter (Signed)
Pt would like to speak to Dr Charlett Blake about her menses prob.  Had talked to her about possibly getting meds for this.

## 2010-11-03 NOTE — Telephone Encounter (Signed)
Agree with plan 

## 2010-11-09 ENCOUNTER — Encounter: Payer: Self-pay | Admitting: Family Medicine

## 2010-11-09 ENCOUNTER — Ambulatory Visit (INDEPENDENT_AMBULATORY_CARE_PROVIDER_SITE_OTHER): Payer: 59 | Admitting: Family Medicine

## 2010-11-09 VITALS — BP 145/78 | HR 118 | Temp 98.2°F | Ht 63.0 in | Wt 218.0 lb

## 2010-11-09 DIAGNOSIS — N924 Excessive bleeding in the premenopausal period: Secondary | ICD-10-CM

## 2010-11-09 MED ORDER — PROGESTERONE MICRONIZED 200 MG PO CAPS: 200.0000 mg | ORAL_CAPSULE | Freq: Every day | ORAL | Status: DC

## 2010-11-09 NOTE — Progress Notes (Signed)
  Subjective:    Patient ID: Rachel Chaney, female    DOB: Jan 05, 1961, 50 y.o.   MRN: [id number]  HPI  50 year old female coming in with irregular menstrual bleeding. Patient states that in the month of August patient did have a 18 day bleeding with some mild back pain. Patient then 3 weeks later had another 8 day of heavy bleeding and back pain. Patient is coming in today due to continuing bleeding but has not had bleeding now for the last 3 days. Patient does give a history of an eight-month amenorrheic during this last year.  Patient does give history of hot flashes as well.  Patient denies any fevers chills or abnormal weight loss.  Patient does have history of uterine fibroids seen on pelvic ultrasound back in 2010. There was some concern for adenomyosis.   Patient does not smoke but has had some uncontrolled diabetes and hypertension.  Review of Systems See above as stated in the history of present illness Past medical and surgical history reviewed with no changes    Objective:   Physical Exam Vital signs reviewed  General appearance - alert, well appearing, and in no distress and oriented to person, place, and time Heart - normal rate, regular rhythm, normal S1, S2, no murmurs, rubs, clicks or gallops Chest - clear to auscultation, no wheezes, rales or rhonchi, symmetric air entry, no tachypnea, retractions or cyanosis Abdomen: Bowel sounds positive in all 4 quadrants, nontender nondistended no palpable masses felt. Extremities - peripheral pulses normal, no pedal edema, no clubbing or cyanosis       Assessment & Plan:

## 2010-11-09 NOTE — Assessment & Plan Note (Addendum)
Patient has had this bleeding for some time. Patient was given many options today. One was to do an endometrial biopsy which patient did decline. Patient also given the option to start dual therapy of oral contraceptives. With patient's risk though of cardiovascular disease secondary to her diabetes and hypertension felt that the risk outweighed the benefit. At this time due to patient actually stopping bleeding we will monitor. Patient was given a prescription for progesterone micronized in case she starts having bleeding past her regular four-day interval. Patient will call if she does take this medication and then she will call again details of her work or not. If patient continues to have bleeding would consider referral to OB/GYN for likely endometrial biopsy as well as re\re ultrasound patient to see if fibroids have grown and possibly patient is a candidate for hysterectomy. Patient followup in one to 2 months for this as well as her diabetes and high blood pressure. The patient come back with bleeding would get CBC FSH and LH

## 2010-11-09 NOTE — Patient Instructions (Signed)
We will try to watch it right now I am giving you a medicine to try for 10 days if you bleed more than your usual four days If the bleeding continues come back and see me in 2 months. We will consider sending you to OB/GYN at that time.

## 2010-11-14 ENCOUNTER — Ambulatory Visit: Payer: 59 | Admitting: Family Medicine

## 2010-12-02 ENCOUNTER — Other Ambulatory Visit: Payer: Self-pay | Admitting: Family Medicine

## 2010-12-04 NOTE — Telephone Encounter (Signed)
Refill request

## 2010-12-21 ENCOUNTER — Other Ambulatory Visit: Payer: Self-pay | Admitting: Family Medicine

## 2010-12-21 NOTE — Telephone Encounter (Signed)
Refill request

## 2010-12-29 ENCOUNTER — Telehealth: Payer: Self-pay | Admitting: Family Medicine

## 2010-12-29 NOTE — Telephone Encounter (Signed)
Patient informed that we were completely out of lantus right now. Expressed understanding.

## 2010-12-29 NOTE — Telephone Encounter (Signed)
Wants to know if we have any samples of insulin?

## 2011-01-04 ENCOUNTER — Other Ambulatory Visit: Payer: Self-pay | Admitting: Family Medicine

## 2011-01-05 NOTE — Telephone Encounter (Signed)
Refill request

## 2011-01-12 ENCOUNTER — Other Ambulatory Visit: Payer: Self-pay | Admitting: Family Medicine

## 2011-01-12 NOTE — Telephone Encounter (Signed)
Refill request

## 2011-02-06 ENCOUNTER — Telehealth: Payer: Self-pay | Admitting: *Deleted

## 2011-02-06 NOTE — Telephone Encounter (Signed)
Patient calls requesting one sample of Lantus insulin.  States she is out and her copay is $40.00 and she cannot pick it up until Friday. Consulted with Dr. Valentina Lucks and he advises may give her one sample.   [employer] lot # [id number] exp date 06/2013.

## 2011-02-24 ENCOUNTER — Other Ambulatory Visit: Payer: Self-pay | Admitting: Family Medicine

## 2011-02-24 MED ORDER — INSULIN PEN NEEDLE 29G X 12MM MISC: 1.0000 "pen " | Freq: Two times a day (BID) | Status: DC

## 2011-02-24 NOTE — Telephone Encounter (Signed)
[facility] needs a new Rx for Pin needles - short, they are out of refills.

## 2011-02-24 NOTE — Telephone Encounter (Signed)
Done

## 2011-03-24 ENCOUNTER — Other Ambulatory Visit: Payer: Self-pay | Admitting: Family Medicine

## 2011-03-24 NOTE — Telephone Encounter (Signed)
Refill request

## 2011-03-28 ENCOUNTER — Encounter: Payer: Self-pay | Admitting: Family Medicine

## 2011-03-28 ENCOUNTER — Ambulatory Visit (INDEPENDENT_AMBULATORY_CARE_PROVIDER_SITE_OTHER): Payer: 59 | Admitting: Family Medicine

## 2011-03-28 VITALS — BP 148/81 | HR 60 | Temp 98.5°F | Ht 63.0 in | Wt 215.0 lb

## 2011-03-28 DIAGNOSIS — IMO0002 Reserved for concepts with insufficient information to code with codable children: Secondary | ICD-10-CM

## 2011-03-28 DIAGNOSIS — Z23 Encounter for immunization: Secondary | ICD-10-CM

## 2011-03-28 DIAGNOSIS — I10 Essential (primary) hypertension: Secondary | ICD-10-CM

## 2011-03-28 DIAGNOSIS — E785 Hyperlipidemia, unspecified: Secondary | ICD-10-CM

## 2011-03-28 DIAGNOSIS — E119 Type 2 diabetes mellitus without complications: Secondary | ICD-10-CM

## 2011-03-28 DIAGNOSIS — E1169 Type 2 diabetes mellitus with other specified complication: Secondary | ICD-10-CM

## 2011-03-28 DIAGNOSIS — E1165 Type 2 diabetes mellitus with hyperglycemia: Secondary | ICD-10-CM

## 2011-03-28 DIAGNOSIS — K219 Gastro-esophageal reflux disease without esophagitis: Secondary | ICD-10-CM

## 2011-03-28 DIAGNOSIS — E669 Obesity, unspecified: Secondary | ICD-10-CM

## 2011-03-28 LAB — LIPID PANEL
Cholesterol: 212 mg/dL — ABNORMAL HIGH (ref 0–200)
HDL: 29 mg/dL — ABNORMAL LOW (ref >39)
LDL Cholesterol: 124 mg/dL — ABNORMAL HIGH (ref 0–99)
Total CHOL/HDL Ratio: 7.3 Ratio
Triglycerides: 294 mg/dL — ABNORMAL HIGH (ref <150)
VLDL: 59 mg/dL — ABNORMAL HIGH (ref 0–40)

## 2011-03-28 LAB — COMPREHENSIVE METABOLIC PANEL
ALT: 13 U/L (ref 0–35)
AST: 12 U/L (ref 0–37)
Albumin: 3.4 g/dL — ABNORMAL LOW (ref 3.5–5.2)
Alkaline Phosphatase: 80 U/L (ref 39–117)
BUN: 16 mg/dL (ref 6–23)
CO2: 26 mEq/L (ref 19–32)
Calcium: 8.6 mg/dL (ref 8.4–10.5)
Chloride: 105 mEq/L (ref 96–112)
Creat: 0.88 mg/dL (ref 0.50–1.10)
Glucose, Bld: 177 mg/dL — ABNORMAL HIGH (ref 70–99)
Potassium: 3.3 mEq/L — ABNORMAL LOW (ref 3.5–5.3)
Sodium: 143 mEq/L (ref 135–145)
Total Bilirubin: 0.3 mg/dL (ref 0.3–1.2)
Total Protein: 6.4 g/dL (ref 6.0–8.3)

## 2011-03-28 LAB — POCT GLYCOSYLATED HEMOGLOBIN (HGB A1C): Hemoglobin A1C: 12

## 2011-03-28 MED ORDER — INSULIN GLARGINE 100 UNIT/ML ~~LOC~~ SOLN: 45.0000 [IU] | Freq: Two times a day (BID) | SUBCUTANEOUS | Status: DC

## 2011-03-28 MED ORDER — OMEPRAZOLE 20 MG PO CPDR: 20.0000 mg | DELAYED_RELEASE_CAPSULE | Freq: Every day | ORAL | Status: DC

## 2011-03-28 NOTE — Assessment & Plan Note (Signed)
Appears to be giving her more trouble. No red flags like weight loss chronic cough. At this time will treat with omeprazole daily see if his improvement followup in one month time.

## 2011-03-28 NOTE — Progress Notes (Signed)
Addended by: Lyndal Pulley on: 03/28/2011 10:07 AM   Modules accepted: Orders

## 2011-03-28 NOTE — Patient Instructions (Addendum)
It is good to see you. I am going to get some labs today and I will call you with the results. Please schedule a colonoscopy I am going to stop her glipizide. I will give you omeprazole to try for your reflux disease. I would like you to come back sometime in the next month for your Pap smear.

## 2011-03-28 NOTE — Progress Notes (Signed)
  Subjective:    Patient ID: Rachel Chaney, female    DOB: 08-Aug-1960, 51 y.o.   MRN: [id number]  HPI 1. Hypertension Blood pressure at home: Not checking except for once a week at the drugstore usually 130s Blood pressure today: 148/81 Taking Meds: Yes Side effects: No ROS: Denies headache visual changes nausea, vomiting, chest pain or abdominal pain or shortness of breath.   Diabetes:  High at home: 225 Low at home: 120 Taking medications: Yes Side effects: No ROS: denies fever, chills, dizziness, loss of conscieness, polyuria poly dipsia numbness or tingling in extremities or chest pain.  Hyperlipidemia-  Lab Results  Component Value Date   CHOL 192 11/11/2007   HDL 41 11/11/2007   LDLCALC 125* 11/11/2007   LDLDIRECT 125* 05/26/2008   TRIG 131 11/11/2007   CHOLHDL 4.7 Ratio 11/11/2007   patient is due for labs patient states that she has been compliant with her Lipitor 40 mg at night. No new skin lesions  Obesity Wt Readings from Last 3 Encounters:  03/28/11 215 lb (97.523 kg)  11/09/10 218 lb (98.884 kg)  10/14/10 215 lb 9.6 oz (97.796 kg)   To be stable still not watching diet as well as she should. Patient also is not doing any physical exercise on a regular basis.  Gastroesophageal reflux disease-patient had been trying to do it with diet but unfortunately still has reflux most days a week has woke her up at night.   Review of Systems Denies fever, chills, nausea vomiting abdominal pain, dysuria, chest pain, shortness of breath dyspnea on exertion or numbness in extremities     Objective:   Physical Exam  Vitals reviewed. Constitutional: She is oriented to person, place, and time. She appears well-developed.       Obese  HENT:  Right Ear: External ear normal.  Left Ear: External ear normal.  Nose: Nose normal.  Eyes: Conjunctivae and EOM are normal. Pupils are equal, round, and reactive to light.  Neck: Normal range of motion. Neck supple. No thyromegaly  present.  Cardiovascular: Normal rate and regular rhythm.   Pulmonary/Chest: Effort normal and breath sounds normal.  Abdominal: Soft. Bowel sounds are normal. She exhibits no distension.  Neurological: She is alert and oriented to person, place, and time. She has normal reflexes.  Skin: Skin is warm and dry.  Psychiatric: She has a normal mood and affect.      Assessment & Plan:

## 2011-03-28 NOTE — Assessment & Plan Note (Signed)
Discussed at length likely secondary to patient's diet as well as obesity. Patient will continue her current medications recheck in one month if still elevated will consider it change in her medications. Unable to increase beta blocker secondary to patient's heart rate is 60. Recheck in one month

## 2011-03-28 NOTE — Assessment & Plan Note (Signed)
Lab Results  Component Value Date   HGBA1C 12.0 03/28/2011   Significantly elevated patient is taking 50 units in the morning and 25 units at night of her Lantus. At this time we will actually increase her to 45 units twice a day. Have patient come back in one month. Recheck A1c in 8-12 weeks. Encourage her to watch her diet explained of the potential difficulties but can occur with diabetes not well controlled.

## 2011-03-28 NOTE — Progress Notes (Signed)
Addended by: Christen Bame D on: 03/28/2011 09:41 AM   Modules accepted: Orders

## 2011-03-28 NOTE — Assessment & Plan Note (Signed)
Discussed with patient again. Encourage her to start a regular physical activity as well as watching her nutrition. Patient declined nutrition consult. We'll recheck again in one month's time.

## 2011-03-29 ENCOUNTER — Telehealth: Payer: Self-pay | Admitting: Family Medicine

## 2011-03-29 MED ORDER — PRAVASTATIN SODIUM 80 MG PO TABS: 80.0000 mg | ORAL_TABLET | Freq: Every day | ORAL | Status: AC

## 2011-03-29 NOTE — Telephone Encounter (Signed)
Called, pt told her that she needed to really concentrate on getting the blood sugar down as well as her cholesterol.  Was not taking the lipitor before so will do pravastatin due to price.  Pt encourage to increase diet and exercise as well. Pt verbalized understanding.

## 2011-04-04 ENCOUNTER — Ambulatory Visit (INDEPENDENT_AMBULATORY_CARE_PROVIDER_SITE_OTHER): Payer: 59 | Admitting: Internal Medicine

## 2011-04-04 VITALS — BP 130/70 | HR 126 | Temp 101.7°F | Resp 20 | Ht 62.5 in | Wt 210.0 lb

## 2011-04-04 DIAGNOSIS — R197 Diarrhea, unspecified: Secondary | ICD-10-CM

## 2011-04-04 DIAGNOSIS — R1084 Generalized abdominal pain: Secondary | ICD-10-CM

## 2011-04-04 DIAGNOSIS — R509 Fever, unspecified: Secondary | ICD-10-CM

## 2011-04-04 DIAGNOSIS — R350 Frequency of micturition: Secondary | ICD-10-CM

## 2011-04-04 DIAGNOSIS — N12 Tubulo-interstitial nephritis, not specified as acute or chronic: Secondary | ICD-10-CM

## 2011-04-04 LAB — POCT URINALYSIS DIPSTICK
Bilirubin, UA: NEGATIVE
Glucose, UA: 250
Ketones, UA: NEGATIVE
Nitrite, UA: NEGATIVE
Protein, UA: 100
Spec Grav, UA: 1.015
Urobilinogen, UA: 0.2
pH, UA: 5

## 2011-04-04 LAB — POCT UA - MICROSCOPIC ONLY
Casts, Ur, LPF, POC: NEGATIVE
Crystals, Ur, HPF, POC: NEGATIVE
Mucus, UA: NEGATIVE
Yeast, UA: NEGATIVE

## 2011-04-04 LAB — POCT CBC
Granulocyte percent: 89.3 %G — AB (ref 37–80)
HCT, POC: 38.4 % (ref 37.7–47.9)
Hemoglobin: 12.4 g/dL (ref 12.2–16.2)
Lymph, poc: 1.4 (ref 0.6–3.4)
MCH, POC: 26.7 pg — AB (ref 27–31.2)
MCHC: 32.3 g/dL (ref 31.8–35.4)
MCV: 82.7 fL (ref 80–97)
MID (cbc): 0.8 (ref 0–0.9)
MPV: 8.6 fL (ref 0–99.8)
POC Granulocyte: 18.6 — AB (ref 2–6.9)
POC LYMPH PERCENT: 6.7 %L — AB (ref 10–50)
POC MID %: 4 %M (ref 0–12)
Platelet Count, POC: 404 10*3/uL (ref 142–424)
RBC: 4.64 M/uL (ref 4.04–5.48)
RDW, POC: 15.3 %
WBC: 20.8 10*3/uL — AB (ref 4.6–10.2)

## 2011-04-04 MED ORDER — CIPROFLOXACIN HCL 500 MG PO TABS: 500.0000 mg | ORAL_TABLET | Freq: Two times a day (BID) | ORAL | Status: AC

## 2011-04-04 MED ORDER — CEFTRIAXONE SODIUM 1 G IJ SOLR
1.0000 g | Freq: Once | INTRAMUSCULAR | Status: AC
  Administered 2011-04-04: 1 g via INTRAMUSCULAR

## 2011-04-04 NOTE — Progress Notes (Signed)
Subjective:    Patient ID: Rachel Chaney, female    DOB: September 30, 1960, 51 y.o.   MRN: [id number]  HPIPatient of Dr. Hulan Saas  History of epigastric abdominal discomfort for 2-3 weeks/recently started on PPI She feels it caused diarrhea and bloating so she discontinued it She still has heartburn with occasional nausea Over the past 36 hours she has had fever, diarrhea, and urinary frequency in addition She has had no bloody bowel movements    Review of Systems Patient Active Problem List  Diagnoses  . DM W/MANIFESTATION NEC, TYPE II, UNCONTROLLED  . HYPERLIPIDEMIA  . OBESITY, NOS  . HYPERTENSION, BENIGN SYSTEMIC  . RHINITIS, ALLERGIC  . Abnormal perimenopausal bleeding  . GERD (gastroesophageal reflux disease)  Her last hemoglobin A1c was reportedly 11      Objective:   Physical ExamTemperature 101.7 In no acute distress HEENT is clear The heart is regular without murmurs rubs or gallops rate is elevated at 115 The lungs are clear The abdomen is obese, but is nontender and there is no organomegaly or mass CVA is nontender to percussion Extremities show no edema      Results for orders placed in visit on 04/04/11  POCT URINALYSIS DIPSTICK      Component Value Range   Color, UA yellow     Clarity, UA cloudy     Glucose, UA 250     Bilirubin, UA negative     Ketones, UA negative     Spec Grav, UA 1.015     Blood, UA small     pH, UA 5.0     Protein, UA 100     Urobilinogen, UA 0.2     Nitrite, UA negative     Leukocytes, UA Trace    POCT UA - MICROSCOPIC ONLY      Component Value Range   WBC, Ur, HPF, POC 8-20     RBC, urine, microscopic 1-5     Bacteria, U Microscopic 3+     Mucus, UA neg     Epithelial cells, urine per micros 5-10     Crystals, Ur, HPF, POC neg     Casts, Ur, LPF, POC neg     Yeast, UA neg    POCT CBC      Component Value Range   WBC 20.8 (*) 4.6 - 10.2 (K/uL)   Lymph, poc 1.4  0.6 - 3.4    POC LYMPH PERCENT 6.7 (*) 10 - 50 (%L)     MID (cbc) 0.8  0 - 0.9    POC MID % 4.0  0 - 12 (%M)   POC Granulocyte 18.6 (*) 2 - 6.9    Granulocyte percent 89.3 (*) 37 - 80 (%G)   RBC 4.64  4.04 - 5.48 (M/uL)   Hemoglobin 12.4  12.2 - 16.2 (g/dL)   HCT, POC 38.4  37.7 - 47.9 (%)   MCV 82.7  80 - 97 (fL)   MCH, POC 26.7 (*) 27 - 31.2 (pg)   MCHC 32.3  31.8 - 35.4 (g/dL)   RDW, POC 15.3     Platelet Count, POC 404  142 - 424 (K/uL)   MPV 8.6  0 - 99.8 (fL)     Assessment & Plan:  Problem #1 abdominal pain epigastric Problem #2 fever Problem #3 diarrhea Problem #4Urinary frequency Problem #5 pyuria Problem #6 leukocytosis Problem #7 uncontrolled diabetes  This presentation represents acute pyelonephritis superimposed on other problems Rocephin 1 g Cipro 500 twice a day  for 10 days Urine culture sent Metabolic profile set/was normal except glucose 2 weeks ago Out of work next 48 hours Recheck Friday at 1 PM/sooner if worse

## 2011-04-05 LAB — COMPREHENSIVE METABOLIC PANEL
ALT: 15 U/L (ref 0–35)
AST: 14 U/L (ref 0–37)
Albumin: 3.9 g/dL (ref 3.5–5.2)
Alkaline Phosphatase: 86 U/L (ref 39–117)
BUN: 27 mg/dL — ABNORMAL HIGH (ref 6–23)
CO2: 22 mEq/L (ref 19–32)
Calcium: 8.5 mg/dL (ref 8.4–10.5)
Chloride: 104 mEq/L (ref 96–112)
Creat: 1.44 mg/dL — ABNORMAL HIGH (ref 0.50–1.10)
Glucose, Bld: 312 mg/dL — ABNORMAL HIGH (ref 70–99)
Potassium: 3.9 mEq/L (ref 3.5–5.3)
Sodium: 137 mEq/L (ref 135–145)
Total Bilirubin: 0.4 mg/dL (ref 0.3–1.2)
Total Protein: 7.2 g/dL (ref 6.0–8.3)

## 2011-04-06 LAB — URINE CULTURE: Colony Count: 30000

## 2011-05-01 ENCOUNTER — Telehealth: Payer: Self-pay | Admitting: Family Medicine

## 2011-05-01 NOTE — Telephone Encounter (Signed)
Called patient back states she needs a medical clearance form so she can exercise. This was done and is up front for her to pick up. Patient understands.

## 2011-05-01 NOTE — Telephone Encounter (Signed)
Needs a note to take for exercise and to use the equipment that she needs to help herself get fit. Would like to pick up today

## 2011-05-10 ENCOUNTER — Other Ambulatory Visit: Payer: Self-pay | Admitting: Family Medicine

## 2011-05-22 ENCOUNTER — Other Ambulatory Visit: Payer: Self-pay | Admitting: Family Medicine

## 2011-06-01 ENCOUNTER — Other Ambulatory Visit: Payer: Self-pay | Admitting: Family Medicine

## 2011-07-14 ENCOUNTER — Telehealth: Payer: Self-pay | Admitting: Family Medicine

## 2011-07-14 NOTE — Telephone Encounter (Signed)
Patient is calling because she is having the same back pain that she had a few months ago and would like a Muscle Relaxer sent to [facility] or [facility] on [address].

## 2011-07-14 NOTE — Telephone Encounter (Signed)
Will forward to MD, but pt will probably need an appt. Rachel Chaney, Rachel Chaney

## 2011-07-15 NOTE — Telephone Encounter (Signed)
Pt needs appt for this. Have not met her and I typicaly don't start w/ muscle relaxer unless warranted. THanks

## 2011-07-19 ENCOUNTER — Other Ambulatory Visit: Payer: Self-pay | Admitting: *Deleted

## 2011-07-19 DIAGNOSIS — E119 Type 2 diabetes mellitus without complications: Secondary | ICD-10-CM

## 2011-07-19 MED ORDER — INSULIN GLARGINE 100 UNIT/ML ~~LOC~~ SOLN: 75.0000 [IU] | Freq: Every day | SUBCUTANEOUS | Status: DC

## 2011-07-26 ENCOUNTER — Other Ambulatory Visit: Payer: Self-pay | Admitting: Family Medicine

## 2011-08-04 ENCOUNTER — Ambulatory Visit (INDEPENDENT_AMBULATORY_CARE_PROVIDER_SITE_OTHER): Payer: 59 | Admitting: Family Medicine

## 2011-08-04 ENCOUNTER — Other Ambulatory Visit (HOSPITAL_COMMUNITY)
Admission: RE | Admit: 2011-08-04 | Discharge: 2011-08-04 | Disposition: A | Discharge status: Home / Self Care | Payer: 59 | Source: Ambulatory Visit | Attending: Family Medicine | Admitting: Family Medicine

## 2011-08-04 ENCOUNTER — Encounter: Payer: Self-pay | Admitting: Family Medicine

## 2011-08-04 VITALS — BP 170/83 | HR 114 | Temp 98.2°F | Wt 216.3 lb

## 2011-08-04 DIAGNOSIS — I10 Essential (primary) hypertension: Secondary | ICD-10-CM

## 2011-08-04 DIAGNOSIS — IMO0002 Reserved for concepts with insufficient information to code with codable children: Secondary | ICD-10-CM

## 2011-08-04 DIAGNOSIS — E1169 Type 2 diabetes mellitus with other specified complication: Secondary | ICD-10-CM

## 2011-08-04 DIAGNOSIS — Z124 Encounter for screening for malignant neoplasm of cervix: Secondary | ICD-10-CM

## 2011-08-04 DIAGNOSIS — Z01419 Encounter for gynecological examination (general) (routine) without abnormal findings: Secondary | ICD-10-CM | POA: Insufficient documentation

## 2011-08-04 DIAGNOSIS — M549 Dorsalgia, unspecified: Secondary | ICD-10-CM

## 2011-08-04 DIAGNOSIS — N889 Noninflammatory disorder of cervix uteri, unspecified: Secondary | ICD-10-CM

## 2011-08-04 DIAGNOSIS — E1165 Type 2 diabetes mellitus with hyperglycemia: Secondary | ICD-10-CM

## 2011-08-04 LAB — POCT GLYCOSYLATED HEMOGLOBIN (HGB A1C): Hemoglobin A1C: 12.4

## 2011-08-04 MED ORDER — CARVEDILOL 12.5 MG PO TABS: 12.5000 mg | ORAL_TABLET | Freq: Two times a day (BID) | ORAL | Status: DC

## 2011-08-04 MED ORDER — INSULIN ASPART 100 UNIT/ML ~~LOC~~ SOLN: 5.0000 [IU] | Freq: Three times a day (TID) | SUBCUTANEOUS | Status: DC

## 2011-08-04 NOTE — Patient Instructions (Addendum)
Thank you for coming in today. Please see Dr. Nori Riis in colposcopy clinic Please come see me in 2 weeks to discuss your diabetes and back pain. Please start taking Novolog (insulin) 5 units three times a day with meals.

## 2011-08-07 ENCOUNTER — Telehealth: Payer: Self-pay | Admitting: Family Medicine

## 2011-08-07 DIAGNOSIS — M549 Dorsalgia, unspecified: Secondary | ICD-10-CM | POA: Insufficient documentation

## 2011-08-07 MED ORDER — CYCLOBENZAPRINE HCL 5 MG PO TABS: 5.0000 mg | ORAL_TABLET | Freq: Every day | ORAL | Status: DC | PRN

## 2011-08-07 NOTE — Telephone Encounter (Signed)
Patient is calling because she thought Dr. Marily Memos was going to a Muscle Relaxer but there wasn't one at the Pharmacy.

## 2011-08-07 NOTE — Telephone Encounter (Signed)
Will forward to Dr Marily Memos

## 2011-08-08 DIAGNOSIS — N889 Noninflammatory disorder of cervix uteri, unspecified: Secondary | ICD-10-CM | POA: Insufficient documentation

## 2011-08-08 HISTORY — DX: Noninflammatory disorder of cervix uteri, unspecified: N88.9

## 2011-08-08 NOTE — Assessment & Plan Note (Addendum)
PAP smear performed. No previous abnromal PAP, menopausal bleeding. H/o Fibroids, Large ~4x2cm hyperemic shiny mass protruding from cervix. Adjacent tissue normal appearing. Will refer to Colposcopy. No h/o unintentional wt loss, fever, night sweats

## 2011-08-08 NOTE — Assessment & Plan Note (Signed)
Refill Coreg 

## 2011-08-08 NOTE — Assessment & Plan Note (Signed)
A1c noted from today. Poor control. GI intolerance to Metformin. Continue w/ Lantus 80 Qday. Adding Novolog 5 units QAC/TID. Risks benefits discussed.

## 2011-08-08 NOTE — Progress Notes (Signed)
  Subjective:    Patient ID: Rachel Chaney, female    DOB: January 19, 1960, 51 y.o.   MRN: [id number]  HPI CC: Pap smear, low back pain, HTN  Back pain: started 1 wk ago. Primarily lower back w/o radiation, and ache in nature. No loss of bladder and bowel function. 4 Advils w/ some relief. Exacerbated w/ exercise adn aerobics, and ambulation. Stretching improves. Similar symptoms infrequently in the past w/ relief w/ NSAIDS and muscle relaxors  Pap Smear: Denies vaginal discharge, odor, irritation. W/ irregular menses, not currently on period. H./o fibroids. PAP never abnormal. Last PAP 3 years ago.  HTN: Pt hypertensive today in clinic. Has not had Coreg for several days. Denies palpitations, HA, CP, SOB, lightheadedness, SOB. Compliant w/ AMlodipine, Vasotec, HCTZ regimens  DM: A1c in clinic today >12. Takes home Lantus 80 daily. Blood sugars at home less than 200, adn closer to 140's. GI intolerance to Metformin. Denies any change in sensation, change in vision, n/v/d/c, excessive urination, other s/s of hypoglycemia or hyperglycemia.    Review of Systems Per HPI    Objective:   Physical Exam  Gen: Obese, NAD MUSC: Normal ROM, no effusions Ext: 2+ pulses, intact sensation of feet,  Skin: warm, well perfused,  Neuro: sensation intact in LE, CN grossly intact GU: Pap performed. Vagina normal, moist, Cervix normal. 4x2cm hyperemic, shiny mass protruding from cervical os.      Assessment & Plan:

## 2011-08-08 NOTE — Assessment & Plan Note (Signed)
Musculoskeletal. NSAIDS, Stretching, massage, flexeril PRN. I want to be a little more aggressive w/ helping pt stay pain free as to aid in her exercise, as pt very motivated and health benefits of wt loss are high.

## 2011-08-24 ENCOUNTER — Encounter: Payer: Self-pay | Admitting: Family Medicine

## 2011-08-24 ENCOUNTER — Ambulatory Visit (INDEPENDENT_AMBULATORY_CARE_PROVIDER_SITE_OTHER): Payer: 59 | Admitting: Family Medicine

## 2011-08-24 VITALS — BP 158/82 | Temp 98.3°F | Ht 65.0 in | Wt 218.0 lb

## 2011-08-24 DIAGNOSIS — N888 Other specified noninflammatory disorders of cervix uteri: Secondary | ICD-10-CM

## 2011-08-24 NOTE — Progress Notes (Signed)
  Subjective:    Patient ID: Rachel Chaney, female    DOB: 04-Nov-1960, 51 y.o.   MRN: [id number]  HPI  Here for evaluation of mass noted at cervical os. Has had intermittent vaginal bleeding. Unsure if this is related to menopause.  Review of Systems    denies pelvic pain, unusual weight change, fever. Objective:   Physical Exam  Vital signs reviewed. GENERAL: Well developed, well nourished, no acute distress Elevated blood pressure noted. GU, externally normal. Very mild atrophic change. Cervix is normal. There is a large 2 x 5 cm red partly mass extruding from the cervical os.  PROCEDURE NOTE: Patient given informed consent, signed copy in the chart. Appropriate time out taken.  Speculum used to view cervix. The mass was biopsied using a cervical biopsy instrument. There was some bleeding which was controlled with Monsel .Marland Kitchen Patient tolerated procedure well.Patient given post procedure instructions.  I'll contact her with pathology.      Assessment & Plan:  #1. Cervical mass. We did  biopsy. Could be benign polyp but is atypical in appearance.  Likely will refer for further management unless biopsy is simple poly---would consider doing that here but also consider referral as this may bleed quite a bit.Her phone number is [phone]

## 2011-08-29 ENCOUNTER — Encounter: Payer: Self-pay | Admitting: Family Medicine

## 2011-08-29 ENCOUNTER — Other Ambulatory Visit: Payer: Self-pay | Admitting: Family Medicine

## 2011-08-29 DIAGNOSIS — N888 Other specified noninflammatory disorders of cervix uteri: Secondary | ICD-10-CM

## 2011-08-29 NOTE — Progress Notes (Signed)
Patient ID: Rachel Chaney, female   DOB: 1960-06-27, 51 y.o.   MRN: [id number] Spoke with Rachel Chaney by phone. Path was negative but this is still atypical--it does not look like a typical polyp. So I would feel better if GYN sees her for further eval amnd tx. Have sent info and order for referral to Dr Delsa Sale and she is [facility] with that plan.

## 2011-09-01 ENCOUNTER — Other Ambulatory Visit: Payer: Self-pay | Admitting: Family Medicine

## 2011-09-05 ENCOUNTER — Telehealth: Payer: Self-pay | Admitting: Family Medicine

## 2011-09-05 NOTE — Telephone Encounter (Signed)
Need to discuss procedure recently had with Dr. Nori Riis.

## 2011-09-05 NOTE — Telephone Encounter (Signed)
Spoke with patient and she was just wanting the phone note read to her again.

## 2011-09-06 ENCOUNTER — Other Ambulatory Visit: Payer: Self-pay | Admitting: *Deleted

## 2011-09-06 DIAGNOSIS — E119 Type 2 diabetes mellitus without complications: Secondary | ICD-10-CM

## 2011-09-06 MED ORDER — INSULIN GLARGINE 100 UNIT/ML ~~LOC~~ SOLN: 75.0000 [IU] | Freq: Every day | SUBCUTANEOUS | Status: DC

## 2011-09-14 ENCOUNTER — Other Ambulatory Visit: Payer: Self-pay

## 2011-09-20 ENCOUNTER — Other Ambulatory Visit: Payer: Self-pay | Admitting: Family Medicine

## 2011-09-28 ENCOUNTER — Other Ambulatory Visit: Payer: Self-pay | Admitting: Family Medicine

## 2011-10-06 ENCOUNTER — Other Ambulatory Visit: Payer: Self-pay | Admitting: Family Medicine

## 2011-10-10 NOTE — Telephone Encounter (Signed)
Patient needs Rx refill for Amlodipine and Glucotrol XL (diabeties medication) sent to [facility] on [address].  It appears that this refill request went to her old MD.

## 2011-10-16 ENCOUNTER — Encounter: Payer: Self-pay | Admitting: Internal Medicine

## 2011-10-16 ENCOUNTER — Ambulatory Visit: Payer: 59

## 2011-10-16 ENCOUNTER — Ambulatory Visit (INDEPENDENT_AMBULATORY_CARE_PROVIDER_SITE_OTHER): Payer: 59 | Admitting: Internal Medicine

## 2011-10-16 VITALS — BP 182/104 | HR 101 | Temp 97.9°F | Resp 20 | Ht 63.0 in | Wt 219.0 lb

## 2011-10-16 DIAGNOSIS — M546 Pain in thoracic spine: Secondary | ICD-10-CM

## 2011-10-16 DIAGNOSIS — D72829 Elevated white blood cell count, unspecified: Secondary | ICD-10-CM

## 2011-10-16 DIAGNOSIS — I10 Essential (primary) hypertension: Secondary | ICD-10-CM

## 2011-10-16 DIAGNOSIS — Z6838 Body mass index (BMI) 38.0-38.9, adult: Secondary | ICD-10-CM

## 2011-10-16 DIAGNOSIS — E1165 Type 2 diabetes mellitus with hyperglycemia: Secondary | ICD-10-CM

## 2011-10-16 DIAGNOSIS — IMO0001 Reserved for inherently not codable concepts without codable children: Secondary | ICD-10-CM

## 2011-10-16 LAB — POCT URINALYSIS DIPSTICK
Bilirubin, UA: NEGATIVE
Glucose, UA: 1000
Ketones, UA: NEGATIVE
Leukocytes, UA: NEGATIVE
Nitrite, UA: NEGATIVE
Protein, UA: 100
Spec Grav, UA: 1.015
Urobilinogen, UA: 0.2
pH, UA: 5

## 2011-10-16 LAB — POCT CBC
Granulocyte percent: 70.3 %G (ref 37–80)
HCT, POC: 36.7 % — AB (ref 37.7–47.9)
Hemoglobin: 11.9 g/dL — AB (ref 12.2–16.2)
Lymph, poc: 5.1 — AB (ref 0.6–3.4)
MCH, POC: 27.7 pg (ref 27–31.2)
MCHC: 32.4 g/dL (ref 31.8–35.4)
MCV: 85.4 fL (ref 80–97)
MID (cbc): 1.5 — AB (ref 0–0.9)
MPV: 8.7 fL (ref 0–99.8)
POC Granulocyte: 15.6 — AB (ref 2–6.9)
POC LYMPH PERCENT: 23.1 %L (ref 10–50)
POC MID %: 6.6 %M (ref 0–12)
Platelet Count, POC: 374 10*3/uL (ref 142–424)
RBC: 4.3 M/uL (ref 4.04–5.48)
RDW, POC: 14.1 %
WBC: 22.2 10*3/uL — AB (ref 4.6–10.2)

## 2011-10-16 LAB — POCT UA - MICROSCOPIC ONLY
Bacteria, U Microscopic: NEGATIVE
Casts, Ur, LPF, POC: NEGATIVE
Crystals, Ur, HPF, POC: NEGATIVE
Epithelial cells, urine per micros: NEGATIVE
Mucus, UA: NEGATIVE
RBC, urine, microscopic: NEGATIVE
Yeast, UA: NEGATIVE

## 2011-10-16 LAB — POCT GLYCOSYLATED HEMOGLOBIN (HGB A1C): Hemoglobin A1C: 12.3

## 2011-10-16 MED ORDER — KETOROLAC TROMETHAMINE 60 MG/2ML IM SOLN
60.0000 mg | Freq: Once | INTRAMUSCULAR | Status: AC
  Administered 2011-10-16: 60 mg via INTRAMUSCULAR

## 2011-10-16 MED ORDER — MELOXICAM 15 MG PO TABS: 15.0000 mg | ORAL_TABLET | Freq: Every day | ORAL | Status: DC

## 2011-10-16 NOTE — Progress Notes (Signed)
Subjective:    Patient ID: Rachel Chaney, female    DOB: 1960-12-19, 51 y.o.   MRN: [id number]  HPIComplaining of pain for the past 3 days and the left thoracic area anteriorly laterally and posteriorly. This is worse with movement such as getting up and down or lifting. It is not worse with deep breathing. There is no cough or shortness of breath. No fever or night sweats. No dyspnea on exertion. There is no rash associated. This discomfort wakes her as she rolls over and hurts when she goes from lying to sitting. There is no known injury to the back from lifting or twisting. She has had prior episodes of back pain reviewed in her chart though this seems different. There are no urinary symptoms. Her lumbar area hurts from time to time but this is higher than that.  Patient Active Problem List  Diagnosis  . DM (diabetes mellitus screen)  . HYPERLIPIDEMIA  . OBESITY, NOS  . HYPERTENSION, BENIGN SYSTEMIC  . GERD (gastroesophageal reflux disease)  . Back pain  . Abnormal appearance of cervix  Followed at [facility] A1c over 12 for at least the last 12 months Hypertension also uncontrolled Just had cervical polyp removed Meds include amlodipine 10 mg, Coreg 12.5 mg, Vasotec 20 mg, glipizide 10 mg, Lantus 75 units  at bedtime, NovoLog 5 units 3 times a day, Pravachol 80 mg and hydrochlorothiazide 25 mg   Review of Systems No weight loss or weight gain   No change in exercise tolerance over the last 3 months No change in vision No palpitations or chest pain/no edema No change in appetite No genitourinary symptoms No paresthesias/had a diagnosis of neuropathy at one point and was treated with Neurontin but she says this is totally resolved?  Past medical history is outlined in her electronic record  Objective:   Physical Exam Filed Vitals:   10/16/11 1924  BP: 182/104  Pulse: 101  Temp: 97.9 F (36.6 C)  Resp: 20  BMI 38 In very mild distress with  movement Pupils equal round reactive to light and accommodation/extraocular movements conjugate/Mildly proptotic No thyromegaly Heart regular with pulse 100 and no murmurs/no carotid bruits Lungs clear to auscultation with diminished breath sounds bilaterally at the bases She is tender along the chest wall at the left lower rib margin from anterior to posterior without rash or erythema and without rib defect The abdomen is obese but soft and there is no organomegaly or pain to palpation in the left upper quadrant or other quadrants Straight leg raise is negative/twisting produces pain in the left thoracic area Extremities reveal no joint abnormalities, no numbness, and no evidence for DVT. She is oriented to time person and place/cranial nerves are intact/gait is normal  [facility] reading (PRIMARY) by  Dr. Fermon Ureta=Atelectasis at both bases/no clear infiltrate/no clear effusion/no obvious rib deformities/lateral out of focus helpful No etiology for left anterolateral and posterior thoracic discomfort seen on x-ray  Results for orders placed in visit on 10/16/11  POCT UA - MICROSCOPIC ONLY      Component Value Range   WBC, Ur, HPF, POC 0-2     RBC, urine, microscopic neg     Bacteria, U Microscopic neg     Mucus, UA neg     Epithelial cells, urine per micros neg     Crystals, Ur, HPF, POC neg     Casts, Ur, LPF, POC neg     Yeast, UA neg    POCT URINALYSIS DIPSTICK  Component Value Range   Color, UA yellow     Clarity, UA clear     Glucose, UA 1000     Bilirubin, UA neg     Ketones, UA neg     Spec Grav, UA 1.015     Blood, UA small     pH, UA 5.0     Protein, UA 100     Urobilinogen, UA 0.2     Nitrite, UA neg     Leukocytes, UA Negative    POCT CBC      Component Value Range   WBC 22.2 (*) 4.6 - 10.2 K/uL   Lymph, poc 5.1 (*) 0.6 - 3.4   POC LYMPH PERCENT 23.1  10 - 50 %L   MID (cbc) 1.5 (*) 0 - 0.9   POC MID % 6.6  0 - 12 %M   POC Granulocyte 15.6 (*) 2 - 6.9    Granulocyte percent 70.3  37 - 80 %G   RBC 4.30  4.04 - 5.48 M/uL   Hemoglobin 11.9 (*) 12.2 - 16.2 g/dL   HCT, POC 36.7 (*) 37.7 - 47.9 %   MCV 85.4  80 - 97 fL   MCH, POC 27.7  27 - 31.2 pg   MCHC 32.4  31.8 - 35.4 g/dL   RDW, POC 14.1     Platelet Count, POC 374  142 - 424 K/uL   MPV 8.7  0 - 99.8 fL  POCT GLYCOSYLATED HEMOGLOBIN (HGB A1C)      Component Value Range   Hemoglobin A1C 12.3           Assessment & Plan:   1. Thoracic back pain ?etio   2. Benign essential HTN -uncontrolled   3. DM (diabetes mellitus), type 2, uncontrolled    4. Leukocytosis-? Persistent since March 2013 versus new onset with this illness/presentation    5. BMI 38.0-38.9,adult     DG Chest 2 View, POCT UA - Microscopic Only, POCT urinalysis dipstick, POCT CBC, Sedimentation Rate, Urine culture, ketorolac (TORADOL) injection 60 mg  Sedimentation Rate  Comprehensive metabolic panel, POCT glycosylated hemoglobin (Hb A1C), Sedimentation Rate, Urine culture  Pathologist smear review   Meds ordered this encounter  Medications  . ketorolac (TORADOL) injection 60 mg    Sig: in clinic  . meloxicam (MOBIC) 15 MG tablet    Sig: Take 1 tablet (15 mg total) by mouth daily.    Dispense:  30 tablet    Refill:  0  Will call regarding followup after labs/Radiology review

## 2011-10-17 ENCOUNTER — Telehealth: Payer: Self-pay

## 2011-10-17 LAB — COMPREHENSIVE METABOLIC PANEL
ALT: 11 U/L (ref 0–35)
AST: 11 U/L (ref 0–37)
Albumin: 3.9 g/dL (ref 3.5–5.2)
Alkaline Phosphatase: 101 U/L (ref 39–117)
BUN: 29 mg/dL — ABNORMAL HIGH (ref 6–23)
CO2: 29 mEq/L (ref 19–32)
Calcium: 9.7 mg/dL (ref 8.4–10.5)
Chloride: 99 mEq/L (ref 96–112)
Creat: 1.35 mg/dL — ABNORMAL HIGH (ref 0.50–1.10)
Glucose, Bld: 273 mg/dL — ABNORMAL HIGH (ref 70–99)
Potassium: 3.6 mEq/L (ref 3.5–5.3)
Sodium: 138 mEq/L (ref 135–145)
Total Bilirubin: 0.4 mg/dL (ref 0.3–1.2)
Total Protein: 7.5 g/dL (ref 6.0–8.3)

## 2011-10-17 LAB — SEDIMENTATION RATE: Sed Rate: 77 mm/hr — ABNORMAL HIGH (ref 0–22)

## 2011-10-17 NOTE — Telephone Encounter (Signed)
Patient states Meloxicam not helping much, she wants to know if she can get something else she can take with this sent to [facility]

## 2011-10-17 NOTE — Telephone Encounter (Signed)
Pt would like to know if we could call her in pain medication and would like to know if we had her test results back in

## 2011-10-18 LAB — PATHOLOGIST SMEAR REVIEW

## 2011-10-18 MED ORDER — TRAMADOL HCL 50 MG PO TABS: 50.0000 mg | ORAL_TABLET | Freq: Three times a day (TID) | ORAL | Status: DC | PRN

## 2011-10-18 NOTE — Telephone Encounter (Signed)
I can send her in some Ultram for pain.

## 2011-10-18 NOTE — Telephone Encounter (Signed)
LMOM that new Rx was sent to pharm and not all of her labs are back. She will be contacted after all labs are in and have been reviewed.

## 2011-10-19 LAB — URINE CULTURE

## 2011-10-20 ENCOUNTER — Other Ambulatory Visit: Payer: Self-pay | Admitting: Family Medicine

## 2011-10-23 ENCOUNTER — Telehealth: Payer: Self-pay | Admitting: Family Medicine

## 2011-10-23 NOTE — Telephone Encounter (Signed)
Called [facility] and was told they do have Rx . Message left on voicemail for patient  that it is ready for pick up/

## 2011-10-23 NOTE — Telephone Encounter (Signed)
Patient says that the refill for Lantus hasn't been sent to [facility] yet.  She would like for it to be resent.

## 2011-10-30 ENCOUNTER — Telehealth: Payer: Self-pay | Admitting: Family Medicine

## 2011-10-30 NOTE — Telephone Encounter (Signed)
Requesting to have rx changed from 90 day supply of her Novolog pen to 30 day supply and have sent to [place] and send the One Touch test strips to [facility] on [address].

## 2011-10-31 ENCOUNTER — Telehealth: Payer: Self-pay | Admitting: Family Medicine

## 2011-10-31 NOTE — Telephone Encounter (Signed)
Rachel Chaney in clinic this afternoon. Rachel Chaney, Rachel Chaney

## 2011-10-31 NOTE — Telephone Encounter (Signed)
Dr. Nori Riis I spoke with [name] in Med records about this patient and she is faxing over requested notes to your attn today,  [name]

## 2011-10-31 NOTE — Telephone Encounter (Signed)
Dear Rachel Chaney Team At end of August we sent her to Dr Delsa Sale for a cervical mass. I have not received any notes. Can you call either her or Dr Merrilee Jansky office and see what the scoop is/ THANKS! [name]

## 2011-10-31 NOTE — Telephone Encounter (Signed)
Spoke to Nursing staff, reviewed chart and OK to make changes as mentioned in message from [facility]. Staff to call in.

## 2011-10-31 NOTE — Telephone Encounter (Signed)
Called pt back she actually is requesting them both to be sent to [facility] on [address].  Called both in and pt informed. Normal Rachel Chaney, Salome Spotted

## 2011-10-31 NOTE — Telephone Encounter (Signed)
Patient is calling back checking on the status of this.

## 2011-10-31 NOTE — Telephone Encounter (Signed)
Patient is calling because she hasn't heard back on the Novolog pen (message below).  She doesn't have any and she would like the Rx sent to [facility] on [address].

## 2011-11-01 ENCOUNTER — Other Ambulatory Visit: Payer: Self-pay | Admitting: Family Medicine

## 2011-11-13 ENCOUNTER — Other Ambulatory Visit: Payer: Self-pay | Admitting: Family Medicine

## 2011-11-14 ENCOUNTER — Telehealth: Payer: Self-pay | Admitting: Family Medicine

## 2011-11-14 DIAGNOSIS — E119 Type 2 diabetes mellitus without complications: Secondary | ICD-10-CM

## 2011-11-14 MED ORDER — INSULIN GLARGINE 100 UNIT/ML ~~LOC~~ SOLN: 75.0000 [IU] | Freq: Every day | SUBCUTANEOUS | Status: DC

## 2011-11-14 NOTE — Telephone Encounter (Signed)
Pt is now out of her insulin solostar - she called [facility] on Friday and they just sent request yesterday - she has been out since yesterday

## 2011-11-14 NOTE — Telephone Encounter (Signed)
Refilled after discussing with Dr. Andria Frames.  Contacted patient to let her know.  While I had her on the phone I asked why she had been going to [place] to see DR. Doolittle.  She says that she only goes there for sick visits after hours.  Advised her that having 2 Primary Care physicians can get confusing.  She assured me that the [facility] was her only primary care site.

## 2012-01-01 ENCOUNTER — Other Ambulatory Visit: Payer: Self-pay | Admitting: Family Medicine

## 2012-01-23 ENCOUNTER — Other Ambulatory Visit: Payer: Self-pay | Admitting: Family Medicine

## 2012-01-24 ENCOUNTER — Other Ambulatory Visit: Payer: Self-pay | Admitting: *Deleted

## 2012-01-26 MED ORDER — HYDROCHLOROTHIAZIDE 25 MG PO TABS: 25.0000 mg | ORAL_TABLET | Freq: Every day | ORAL | Status: DC

## 2012-02-08 ENCOUNTER — Other Ambulatory Visit: Payer: Self-pay | Admitting: Family Medicine

## 2012-03-05 ENCOUNTER — Telehealth: Payer: Self-pay | Admitting: Family Medicine

## 2012-03-05 MED ORDER — INSULIN GLARGINE 100 UNIT/ML ~~LOC~~ SOLN: SUBCUTANEOUS | Status: DC

## 2012-03-05 NOTE — Telephone Encounter (Signed)
Patient states she has appointment with MD 03/11/2012. Rx for Lantus solostar sent to pharmacy.

## 2012-03-05 NOTE — Telephone Encounter (Signed)
Pt is now out of Solostar pen - states that the pharmacy has sent several requests  [place]

## 2012-03-11 ENCOUNTER — Ambulatory Visit (INDEPENDENT_AMBULATORY_CARE_PROVIDER_SITE_OTHER): Payer: 59 | Admitting: Family Medicine

## 2012-03-11 ENCOUNTER — Encounter: Payer: Self-pay | Admitting: Family Medicine

## 2012-03-11 VITALS — BP 149/81 | HR 98 | Temp 98.4°F | Ht 63.0 in | Wt 223.0 lb

## 2012-03-11 DIAGNOSIS — R200 Anesthesia of skin: Secondary | ICD-10-CM | POA: Insufficient documentation

## 2012-03-11 DIAGNOSIS — Z131 Encounter for screening for diabetes mellitus: Secondary | ICD-10-CM

## 2012-03-11 DIAGNOSIS — R209 Unspecified disturbances of skin sensation: Secondary | ICD-10-CM

## 2012-03-11 DIAGNOSIS — E1169 Type 2 diabetes mellitus with other specified complication: Secondary | ICD-10-CM

## 2012-03-11 DIAGNOSIS — E785 Hyperlipidemia, unspecified: Secondary | ICD-10-CM

## 2012-03-11 DIAGNOSIS — E1165 Type 2 diabetes mellitus with hyperglycemia: Secondary | ICD-10-CM

## 2012-03-11 DIAGNOSIS — K219 Gastro-esophageal reflux disease without esophagitis: Secondary | ICD-10-CM

## 2012-03-11 LAB — BASIC METABOLIC PANEL
BUN: 24 mg/dL — ABNORMAL HIGH (ref 6–23)
CO2: 27 mEq/L (ref 19–32)
Calcium: 9.7 mg/dL (ref 8.4–10.5)
Chloride: 105 mEq/L (ref 96–112)
Creat: 0.93 mg/dL (ref 0.50–1.10)
Glucose, Bld: 214 mg/dL — ABNORMAL HIGH (ref 70–99)
Potassium: 4.3 mEq/L (ref 3.5–5.3)
Sodium: 141 mEq/L (ref 135–145)

## 2012-03-11 LAB — POCT GLYCOSYLATED HEMOGLOBIN (HGB A1C): Hemoglobin A1C: 14

## 2012-03-11 LAB — LDL CHOLESTEROL, DIRECT: Direct LDL: 107 mg/dL — ABNORMAL HIGH

## 2012-03-11 MED ORDER — INSULIN ASPART 100 UNIT/ML ~~LOC~~ SOLN: 7.0000 [IU] | Freq: Three times a day (TID) | SUBCUTANEOUS | Status: DC

## 2012-03-11 MED ORDER — INSULIN GLARGINE 100 UNIT/ML ~~LOC~~ SOLN: SUBCUTANEOUS | Status: DC

## 2012-03-11 MED ORDER — GABAPENTIN 300 MG PO CAPS: ORAL_CAPSULE | ORAL | Status: DC

## 2012-03-11 NOTE — Patient Instructions (Addendum)
You are doing well, but I am concerned about your overall health. Please increase your lantus to 85 units daily then increase by 3 units daily if your morning sugar level is above 200. You may decrease your dose if your morning sugar is below 100.  Please increase your mealtime novolog to 7 units with meals 3 times daily Please start the gabapentin at night.  Please come back to review your labs and bring in your sugar log book Please come back to see me in 2-4 weeks Have a great day.   Diabetes Meal Planning Guide The diabetes meal planning guide is a tool to help you plan your meals and snacks. It is important for people with diabetes to manage their blood glucose (sugar) levels. Choosing the right foods and the right amounts throughout your day will help control your blood glucose. Eating right can even help you improve your blood pressure and reach or maintain a healthy weight. CARBOHYDRATE COUNTING MADE EASY When you eat carbohydrates, they turn to sugar. This raises your blood glucose level. Counting carbohydrates can help you control this level so you feel better. When you plan your meals by counting carbohydrates, you can have more flexibility in what you eat and balance your medicine with your food intake. Carbohydrate counting simply means adding up the total amount of carbohydrate grams in your meals and snacks. Try to eat about the same amount at each meal. Foods with carbohydrates are listed below. Each portion below is 1 carbohydrate serving or 15 grams of carbohydrates. Ask your dietician how many grams of carbohydrates you should eat at each meal or snack. Grains and Starches  1 slice bread.   English muffin or hotdog/hamburger bun.   cup cold cereal (unsweetened).   cup cooked pasta or rice.   cup starchy vegetables (corn, potatoes, peas, beans, winter squash).  1 tortilla (6 inches).   bagel.  1 waffle or pancake (size of a CD).   cup cooked cereal.  4 to 6  small crackers. *Whole grain is recommended. Fruit  1 cup fresh unsweetened berries, melon, papaya, pineapple.  1 small fresh fruit.   banana or mango.   cup fruit juice (4 oz unsweetened).   cup canned fruit in natural juice or water.  2 tbs dried fruit.  12 to 15 grapes or cherries. Milk and Yogurt  1 cup fat-free or 1% milk.  1 cup soy milk.  6 oz light yogurt with sugar-free sweetener.  6 oz low-fat soy yogurt.  6 oz plain yogurt. Vegetables  1 cup raw or  cup cooked is counted as 0 carbohydrates or a "free" food.  If you eat 3 or more servings at 1 meal, count them as 1 carbohydrate serving. Other Carbohydrates   oz chips or pretzels.   cup ice cream or frozen yogurt.   cup sherbet or sorbet.  2 inch square cake, no frosting.  1 tbs honey, sugar, jam, jelly, or syrup.  2 small cookies.  3 squares of graham crackers.  3 cups popcorn.  6 crackers.  1 cup broth-based soup.  Count 1 cup casserole or other mixed foods as 2 carbohydrate servings.  Foods with less than 20 calories in a serving may be counted as 0 carbohydrates or a "free" food. You may want to purchase a book or computer software that lists the carbohydrate gram counts of different foods. In addition, the nutrition facts panel on the labels of the foods you eat are a good source  of this information. The label will tell you how big the serving size is and the total number of carbohydrate grams you will be eating per serving. Divide this number by 15 to obtain the number of carbohydrate servings in a portion. Remember, 1 carbohydrate serving equals 15 grams of carbohydrate. SERVING SIZES Measuring foods and serving sizes helps you make sure you are getting the right amount of food. The list below tells how big or small some common serving sizes are.  1 oz.........4 stacked dice.  3 oz........Marland KitchenDeck of cards.  1 tsp.......Marland KitchenTip of little finger.  1 tbs......Marland KitchenMarland KitchenThumb.  2  tbs.......Marland KitchenGolf ball.   cup......Marland KitchenHalf of a fist.  1 cup.......Marland KitchenA fist. SAMPLE DIABETES MEAL PLAN Below is a sample meal plan that includes foods from the grain and starches, dairy, vegetable, fruit, and meat groups. A dietician can individualize a meal plan to fit your calorie needs and tell you the number of servings needed from each food group. However, controlling the total amount of carbohydrates in your meal or snack is more important than making sure you include all of the food groups at every meal. You may interchange carbohydrate containing foods (dairy, starches, and fruits). The meal plan below is an example of a 2000 calorie diet using carbohydrate counting. This meal plan has 17 carbohydrate servings. Breakfast  1 cup oatmeal (2 carb servings).   cup light yogurt (1 carb serving).  1 cup blueberries (1 carb serving).   cup almonds. Snack  1 large apple (2 carb servings).  1 low-fat string cheese stick. Lunch  Chicken breast salad.  1 cup spinach.   cup chopped tomatoes.  2 oz chicken breast, sliced.  2 tbs low-fat [place] dressing.  12 whole-wheat crackers (2 carb servings).  12 to 15 grapes (1 carb serving).  1 cup low-fat milk (1 carb serving). Snack  1 cup carrots.   cup hummus (1 carb serving). Dinner  3 oz broiled salmon.  1 cup brown rice (3 carb servings). Snack  1  cups steamed broccoli (1 carb serving) drizzled with 1 tsp olive oil and lemon juice.  1 cup light pudding (2 carb servings). DIABETES MEAL PLANNING WORKSHEET Your dietician can use this worksheet to help you decide how many servings of foods and what types of foods are right for you.  BREAKFAST Food Group and Servings / Carb Servings Grain/Starches __________________________________ Dairy __________________________________________ Vegetable ______________________________________ Fruit ___________________________________________ Meat  __________________________________________ Fat ____________________________________________ LUNCH Food Group and Servings / Carb Servings Grain/Starches ___________________________________ Dairy ___________________________________________ Fruit ____________________________________________ Meat ___________________________________________ Fat _____________________________________________ W[employer] Food Group and Servings / Carb Servings Grain/Starches ___________________________________ Dairy ___________________________________________ Fruit ____________________________________________ Meat ___________________________________________ Fat _____________________________________________ SNACKS Food Group and Servings / Carb Servings Grain/Starches ___________________________________ Dairy ___________________________________________ Vegetable _______________________________________ Fruit ____________________________________________ Meat ___________________________________________ Fat _____________________________________________ DAILY TOTALS Starches _________________________ Vegetable ________________________ Fruit ____________________________ Dairy ____________________________ Meat ____________________________ Fat ______________________________ Document Released: 09/22/2004 Document Revised: 03/20/2011 Document Reviewed: 08/03/2008 Exit[employer] Patient Information 2013 [place], [place].

## 2012-03-11 NOTE — Progress Notes (Signed)
Rachel Chaney is a 52 y.o. female who presents to [facility] today for DM check.  DM: BS ranges around the 170s-200. Denies shaky, HA, lightheadedness. Occasional numbness in toes. Lantus 80 in the morning.  Numbness present intermittently for less than 1 year. Worse at night.   HTN: taking norvasc, coreg, vasotec for BP. Denies CP, palpitaitons, SOB.  GERD: continues to have reflux intermittently typically w/ spicy foods. Aware of triggers and tries to avoid.   HLD: Taking stating. Denies general muscle pains   The following portions of the patient's history were reviewed and updated as appropriate: allergies, current medications, past medical history, family and social history, and problem list.  [name].  Past Medical History  Diagnosis Date  . Diabetes mellitus   . Hyperlipidemia   . Hypertension     ROS as above otherwise neg.    Medications reviewed. Current Outpatient Prescriptions  Medication Sig Dispense Refill  . amLODipine (NORVASC) 10 MG tablet TAKE 1 TABLET BY MOUTH ONCE A DAY  30 tablet  3  . aspirin 81 MG tablet Take 81 mg by mouth daily.        . carvedilol (COREG) 12.5 MG tablet TAKE 1 TABLET BY MOUTH 2 TIMES DAILY WITH A MEAL.  60 tablet  2  . cetirizine (ZYRTEC) 10 MG tablet Take 10 mg by mouth daily as needed.       . enalapril (VASOTEC) 20 MG tablet TAKE 2 TABLETS BY MOUTH DAILY.  60 tablet  11  . glucose blood (ONE TOUCH ULTRA TEST) test strip Use as instructed       . hydrochlorothiazide (HYDRODIURIL) 25 MG tablet Take 1 tablet (25 mg total) by mouth daily.  90 tablet  3  . insulin aspart (NOVOLOG FLEXPEN) 100 UNIT/ML injection Inject 5 Units into the skin 3 (three) times daily before meals.  1 vial  12  . insulin glargine (LANTUS SOLOSTAR) 100 UNIT/ML injection INJECT 75 UNITS SUBCUTANEOUSLY EVERY DAY  30 mL  0  . Insulin Pen Needle 29G X 12MM MISC Inject 1 pen into the skin 2 (two) times daily. Check blood sugar daily.  100 each  6  . omeprazole  (PRILOSEC) 20 MG capsule Take 1 capsule (20 mg total) by mouth daily.  90 capsule  3  . pravastatin (PRAVACHOL) 80 MG tablet Take 1 tablet (80 mg total) by mouth daily.  90 tablet  2  . [DISCONTINUED] progesterone (PROMETRIUM) 200 MG capsule Take 1 capsule (200 mg total) by mouth daily. Take for 10 days if bleeding occurs for more than 4 days.  10 capsule  1   No current facility-administered medications for this visit.    Exam:  BP 156/81  Pulse 98  Temp(Src) 98.4 F (36.9 C) (Oral)  Ht 5\' 3"  (1.6 m)  Wt 223 lb (101.152 kg)  BMI 39.51 kg/m2 Gen: Well NAD, obese HEENT: EOMI,  MMM Lungs: CTABL Nl WOB Heart: RRR no MRG Abd: NABS, NT, ND Exts: Non edematous BL  LE, warm and well perfused.  Neuro: CN intact. Sensation diminished in the great toes bilaterally  No results found for this or any previous visit (from the past 72 hour(s)).  Diabetic foot exam performed and sensation diminished in great toes bilaterally.

## 2012-03-11 NOTE — Assessment & Plan Note (Addendum)
No recent lipid panel Direct LDL today Anticipate needting to escelate therapy  Addendum: Direct LDL 107 PT to try lifestyle modification.  Keep pravastatin at max dose

## 2012-03-11 NOTE — Assessment & Plan Note (Signed)
Likely diabetic neuropathy Starting Neurontin

## 2012-03-11 NOTE — Assessment & Plan Note (Signed)
Continue prilosec Continue to avoid trigger foods

## 2012-03-11 NOTE — Assessment & Plan Note (Addendum)
Spent >10 min discussing rirsks of poorly controlled DM Pt to increase Lantus to 85 units tomorrow then increase by 3 units daily until BS is below 200 in the morning. Decrease insulin by 3 units daily if BS is below 100. Pt to increase mealtime coverage to 10 units w/ meals Pt educated on titration and amenable Bring in sugar log

## 2012-03-12 ENCOUNTER — Other Ambulatory Visit: Payer: Self-pay | Admitting: Family Medicine

## 2012-05-13 ENCOUNTER — Other Ambulatory Visit: Payer: Self-pay | Admitting: Family Medicine

## 2012-05-14 ENCOUNTER — Other Ambulatory Visit: Payer: Self-pay | Admitting: Family Medicine

## 2012-05-14 ENCOUNTER — Ambulatory Visit (INDEPENDENT_AMBULATORY_CARE_PROVIDER_SITE_OTHER): Payer: 59 | Admitting: Family Medicine

## 2012-05-14 ENCOUNTER — Emergency Department (HOSPITAL_COMMUNITY): Payer: 59

## 2012-05-14 ENCOUNTER — Encounter (HOSPITAL_COMMUNITY): Payer: Self-pay | Admitting: Emergency Medicine

## 2012-05-14 ENCOUNTER — Emergency Department (HOSPITAL_COMMUNITY)
Admission: EM | Admit: 2012-05-14 | Discharge: 2012-05-14 | Disposition: A | Discharge status: Home / Self Care | Payer: 59 | Source: Home / Self Care | Attending: Emergency Medicine | Admitting: Emergency Medicine

## 2012-05-14 VITALS — BP 127/72 | HR 94 | Temp 98.2°F | Resp 16 | Ht 64.0 in | Wt 222.0 lb

## 2012-05-14 DIAGNOSIS — T148XXA Other injury of unspecified body region, initial encounter: Secondary | ICD-10-CM

## 2012-05-14 DIAGNOSIS — L02619 Cutaneous abscess of unspecified foot: Secondary | ICD-10-CM | POA: Insufficient documentation

## 2012-05-14 DIAGNOSIS — E1165 Type 2 diabetes mellitus with hyperglycemia: Secondary | ICD-10-CM

## 2012-05-14 DIAGNOSIS — E119 Type 2 diabetes mellitus without complications: Secondary | ICD-10-CM | POA: Insufficient documentation

## 2012-05-14 DIAGNOSIS — I10 Essential (primary) hypertension: Secondary | ICD-10-CM | POA: Insufficient documentation

## 2012-05-14 DIAGNOSIS — Z79899 Other long term (current) drug therapy: Secondary | ICD-10-CM | POA: Insufficient documentation

## 2012-05-14 DIAGNOSIS — M25579 Pain in unspecified ankle and joints of unspecified foot: Secondary | ICD-10-CM

## 2012-05-14 DIAGNOSIS — Z7982 Long term (current) use of aspirin: Secondary | ICD-10-CM | POA: Insufficient documentation

## 2012-05-14 DIAGNOSIS — M7989 Other specified soft tissue disorders: Secondary | ICD-10-CM | POA: Insufficient documentation

## 2012-05-14 DIAGNOSIS — L0390 Cellulitis, unspecified: Secondary | ICD-10-CM

## 2012-05-14 DIAGNOSIS — IMO0002 Reserved for concepts with insufficient information to code with codable children: Secondary | ICD-10-CM

## 2012-05-14 DIAGNOSIS — L03119 Cellulitis of unspecified part of limb: Secondary | ICD-10-CM

## 2012-05-14 DIAGNOSIS — IMO0001 Reserved for inherently not codable concepts without codable children: Secondary | ICD-10-CM

## 2012-05-14 DIAGNOSIS — E118 Type 2 diabetes mellitus with unspecified complications: Secondary | ICD-10-CM

## 2012-05-14 DIAGNOSIS — L03115 Cellulitis of right lower limb: Secondary | ICD-10-CM

## 2012-05-14 DIAGNOSIS — L089 Local infection of the skin and subcutaneous tissue, unspecified: Secondary | ICD-10-CM

## 2012-05-14 DIAGNOSIS — L02419 Cutaneous abscess of limb, unspecified: Secondary | ICD-10-CM

## 2012-05-14 DIAGNOSIS — M25571 Pain in right ankle and joints of right foot: Secondary | ICD-10-CM

## 2012-05-14 DIAGNOSIS — Z794 Long term (current) use of insulin: Secondary | ICD-10-CM | POA: Insufficient documentation

## 2012-05-14 DIAGNOSIS — T798XXA Other early complications of trauma, initial encounter: Secondary | ICD-10-CM

## 2012-05-14 DIAGNOSIS — E785 Hyperlipidemia, unspecified: Secondary | ICD-10-CM | POA: Insufficient documentation

## 2012-05-14 LAB — POCT CBC
Granulocyte percent: 76.8 %G (ref 37–80)
HCT, POC: 33.2 % — AB (ref 37.7–47.9)
Hemoglobin: 10.1 g/dL — AB (ref 12.2–16.2)
Lymph, poc: 6.9 — AB (ref 0.6–3.4)
MCH, POC: 26 pg — AB (ref 27–31.2)
MCHC: 30.4 g/dL — AB (ref 31.8–35.4)
MCV: 85.3 fL (ref 80–97)
MID (cbc): 3.2 — AB (ref 0–0.9)
MPV: 8.1 fL (ref 0–99.8)
POC Granulocyte: 33.3 — AB (ref 2–6.9)
POC LYMPH PERCENT: 15.9 %L (ref 10–50)
POC MID %: 7.3 %M (ref 0–12)
Platelet Count, POC: 478 10*3/uL — AB (ref 142–424)
RBC: 3.89 M/uL — AB (ref 4.04–5.48)
RDW, POC: 15.5 %
WBC: 43.4 10*3/uL — AB (ref 4.6–10.2)

## 2012-05-14 LAB — COMPREHENSIVE METABOLIC PANEL
ALT: 12 U/L (ref 0–35)
AST: 15 U/L (ref 0–37)
Albumin: 3.1 g/dL — ABNORMAL LOW (ref 3.5–5.2)
Alkaline Phosphatase: 174 U/L — ABNORMAL HIGH (ref 39–117)
BUN: 20 mg/dL (ref 6–23)
CO2: 25 mEq/L (ref 19–32)
Calcium: 9.1 mg/dL (ref 8.4–10.5)
Chloride: 99 mEq/L (ref 96–112)
Creat: 1.41 mg/dL — ABNORMAL HIGH (ref 0.50–1.10)
Glucose, Bld: 235 mg/dL — ABNORMAL HIGH (ref 70–99)
Potassium: 3.5 mEq/L (ref 3.5–5.3)
Sodium: 138 mEq/L (ref 135–145)
Total Bilirubin: 0.5 mg/dL (ref 0.3–1.2)
Total Protein: 7 g/dL (ref 6.0–8.3)

## 2012-05-14 LAB — GLUCOSE, POCT (MANUAL RESULT ENTRY): POC Glucose: 258 mg/dl — AB (ref 70–99)

## 2012-05-14 MED ORDER — KETOROLAC TROMETHAMINE 30 MG/ML IJ SOLN
30.0000 mg | Freq: Once | INTRAMUSCULAR | Status: AC
  Administered 2012-05-14: 30 mg via INTRAMUSCULAR

## 2012-05-14 MED ORDER — INSULIN GLARGINE 100 UNIT/ML ~~LOC~~ SOLN: SUBCUTANEOUS | Status: DC

## 2012-05-14 MED ORDER — CLINDAMYCIN HCL 150 MG PO CAPS: 300.0000 mg | ORAL_CAPSULE | Freq: Three times a day (TID) | ORAL | Status: DC

## 2012-05-14 MED ORDER — CLINDAMYCIN PHOSPHATE 900 MG/50ML IV SOLN
900.0000 mg | Freq: Once | INTRAVENOUS | Status: AC
  Administered 2012-05-14: 900 mg via INTRAVENOUS
  Filled 2012-05-14: qty 50

## 2012-05-14 MED ORDER — SODIUM CHLORIDE 0.9 % IV BOLUS (SEPSIS)
1000.0000 mL | Freq: Once | INTRAVENOUS | Status: AC
  Administered 2012-05-14: 1000 mL via INTRAVENOUS

## 2012-05-14 MED ORDER — CIPROFLOXACIN HCL 500 MG PO TABS: 500.0000 mg | ORAL_TABLET | Freq: Two times a day (BID) | ORAL | Status: DC

## 2012-05-14 MED ORDER — SULFAMETHOXAZOLE-TMP DS 800-160 MG PO TABS
1.0000 | ORAL_TABLET | Freq: Once | ORAL | Status: AC
  Administered 2012-05-14: 1 via ORAL
  Filled 2012-05-14: qty 1

## 2012-05-14 NOTE — ED Notes (Addendum)
Pt sent from Urgent Care to be evaluated for diabetic cellulitis in r/ foot x 2 days. R/foot swollen , red to mid calf-anterior. Reports minimal pain. Yellow blistering on bottom of foot and between toes increased due soaking  foot in epsom salts x 2 days

## 2012-05-14 NOTE — Progress Notes (Signed)
[facility]:  Office Visit  Chief Complaint:  Chief Complaint  Patient presents with  . Foot Swelling    right foot pain x 2 day    HPI: Rachel Chaney is a 52 y.o. female who complains of a 3 day history of right foot pain and swelling. NKI.  Poorly controlled diabetic last HbA1c was 14 in March. No fevers, chills. Has had decrease ROM and pain with walking due to swelling and . She has been soaking her foot, the skin off the bottom has come off. She had has drainage since she soaked it but mostly just clear yellow fluid where the water blister was from soaking in water and hydrogen peroxide. Again denies fevers, chills. She recently had retinal detachment surgey and is on ? Steroid vs abx drops but no oral steroids.  Past Medical History  Diagnosis Date  . Diabetes mellitus   . Hyperlipidemia   . Hypertension    Past Surgical History  Procedure Laterality Date  . Eye surgery Right     retinal detachment   History   Social History  . Marital Status: Married    Spouse Name: N/A    Number of Children: N/A  . Years of Education: N/A   Social History Main Topics  . Smoking status: Never Smoker   . Smokeless tobacco: None  . Alcohol Use: None  . Drug Use: None  . Sexually Active: None   Other Topics Concern  . None   Social History Narrative  . None   Family History  Problem Relation Age of Onset  . Heart disease Mother   . Heart disease Father    No Known Allergies Prior to Admission medications   Medication Sig Start Date End Date Taking? Authorizing Provider  amLODipine (NORVASC) 10 MG tablet TAKE 1 TABLET BY MOUTH ONCE A DAY 03/12/12  Yes Waldemar Dickens, MD  aspirin 81 MG tablet Take 81 mg by mouth daily.     Yes Historical Provider, MD  carvedilol (COREG) 12.5 MG tablet TAKE 1 TABLET BY MOUTH 2 TIMES DAILY WITH A MEAL. 02/08/12  Yes Waldemar Dickens, MD  cetirizine (ZYRTEC) 10 MG tablet Take 10 mg by mouth daily as needed.    Yes Historical  Provider, MD  enalapril (VASOTEC) 20 MG tablet TAKE 2 TABLETS BY MOUTH DAILY. 11/01/11  Yes Waldemar Dickens, MD  gabapentin (NEURONTIN) 300 MG capsule Take 300mg  every night for 1 week. If symptoms persist in the daytime you may take 3 times a day after 1 week. 03/11/12  Yes Waldemar Dickens, MD  hydrochlorothiazide (HYDRODIURIL) 25 MG tablet Take 1 tablet (25 mg total) by mouth daily. 01/24/12  Yes Waldemar Dickens, MD  insulin aspart (NOVOLOG FLEXPEN) 100 UNIT/ML injection Inject 7 Units into the skin 3 (three) times daily before meals. 03/11/12 03/11/13 Yes Waldemar Dickens, MD  insulin glargine (LANTUS) 100 UNIT/ML injection INJECT 85 UNITS SUBCUTANEOUSLY Daily. Increase by 3 units daily for sugars greater than 200. Decrease by 3 units for sugars less than 100 05/14/12  Yes Waldemar Dickens, MD  Insulin Pen Needle 29G X 12MM MISC Inject 1 pen into the skin 2 (two) times daily. Check blood sugar daily. 02/24/11  Yes Lyndal Pulley, DO  glucose blood (ONE TOUCH ULTRA TEST) test strip Use as instructed     Historical Provider, MD     ROS: The patient denies fevers, chills, night sweats, unintentional weight loss, chest pain, palpitations,  wheezing, dyspnea on exertion, nausea, vomiting, abdominal pain, dysuria, hematuria, melena, numbness, weakness, or tingling.   All other systems have been reviewed and were otherwise negative with the exception of those mentioned in the HPI and as above.    PHYSICAL EXAM: Filed Vitals:   05/14/12 1153  BP: 127/72  Pulse: 94  Temp: 98.2 F (36.8 C)  Resp: 16   Filed Vitals:   05/14/12 1153  Height: 5\' 4"  (1.626 m)  Weight: 222 lb (100.699 kg)   Body mass index is 38.09 kg/(m^2).  General: Alert, no acute distress HEENT:  Normocephalic, atraumatic, oropharynx patent.  Cardiovascular:  Regular rate and rhythm, no rubs murmurs or gallops.  No Carotid bruits, radial pulse intact. No pedal edema.  Respiratory: Clear to auscultation bilaterally.  No wheezes, rales,  or rhonchi.  No cyanosis, no use of accessory musculature GI: No organomegaly, abdomen is soft and non-tender, positive bowel sounds.  No masses. Skin: No rashes. Neurologic: Facial musculature symmetric. Psychiatric: Patient is appropriate throughout our interaction. Lymphatic: No cervical lymphadenopathy Musculoskeletal: Gait intact. Right LE-+ cellulitis from toes to mid lower leg on anterior aspect, warm, erythematous + diabetic ulcer stage 1 on bottom of foot + clear vesicle + DP   LABS: Results for orders placed in visit on 05/14/12  POCT CBC      Result Value Range   WBC 43.4 (*) 4.6 - 10.2 K/uL   Lymph, poc 6.9 (*) 0.6 - 3.4   POC LYMPH PERCENT 15.9  10 - 50 %L   MID (cbc) 3.2 (*) 0 - 0.9   POC MID % 7.3  0 - 12 %M   POC Granulocyte 33.3 (*) 2 - 6.9   Granulocyte percent 76.8  37 - 80 %G   RBC 3.89 (*) 4.04 - 5.48 M/uL   Hemoglobin 10.1 (*) 12.2 - 16.2 g/dL   HCT, POC 33.2 (*) 37.7 - 47.9 %   MCV 85.3  80 - 97 fL   MCH, POC 26.0 (*) 27 - 31.2 pg   MCHC 30.4 (*) 31.8 - 35.4 g/dL   RDW, POC 15.5     Platelet Count, POC 478 (*) 142 - 424 K/uL   MPV 8.1  0 - 99.8 fL  GLUCOSE, POCT (MANUAL RESULT ENTRY)      Result Value Range   POC Glucose 258 (*) 70 - 99 mg/dl     EKG/XRAY:   Primary read interpreted by Dr. Marin Comment at [facility]   ASSESSMENT/PLAN: Encounter Diagnoses  Name Primary?  . Infected wound, initial encounter Yes  . Type II or unspecified type diabetes mellitus with unspecified complication, uncontrolled   . Pain in joint, ankle and foot, right    Patient wants something for pain, She has sensitivity to codeine derivatives She received a low dose toradol injection 30 mg IM x 1. Creatinine fxn was ok from last labs. She was advise about risk and benefits of foot infection and cellulitis in diabetics. We discussed out patient vs getting IV abx and/or evaluation in ER.  I advise against getting outpatient abx at this time due to leukocytosis and poorly controlled  DM and acute rapid spread of cellulitis.  Even if she is on opthalmic steroids it would not be a major contributor to her severely elevated WBC.  She will go to [facility] ER by car, driven by daughter.  [facility] ER Charge nurse notified.      LE, [place], DO 05/14/2012 2:07 PM

## 2012-05-14 NOTE — Addendum Note (Signed)
Addended by: Glenford Bayley on: 05/14/2012 02:49 PM   Modules accepted: Level of Service

## 2012-05-14 NOTE — ED Provider Notes (Signed)
History     CSN: [id number]  Arrival date & time 05/14/12  1452   First MD Initiated Contact with Patient 05/14/12 1501      Chief Complaint  Patient presents with  . Foot Pain    2 day hx of foot swelling, redness, blisters  . Leg Swelling    HPI  The patient presents with increasing erythema about her right distal lower extremity. She has a history of insulin diabetes, notes that there has been a nonhealing sore on the underside of the distal foot for some time.  With past 2 days has been increasing erythema, swelling throughout the foot and now the ankle. She notes that there is no relief with soaking, or aspirin. No new fever, chills, vomiting, diarrhea, other focal complaints. She states that she is compliant with medication. Blood sugar typically is 160/170.   Past Medical History  Diagnosis Date  . Diabetes mellitus   . Hyperlipidemia   . Hypertension     Past Surgical History  Procedure Laterality Date  . Eye surgery Right     retinal detachment    Family History  Problem Relation Age of Onset  . Heart disease Mother   . Heart disease Father     History  Substance Use Topics  . Smoking status: Never Smoker   . Smokeless tobacco: Not on file  . Alcohol Use: No    OB History   Grav Para Term Preterm Abortions TAB SAB Ect Mult Living                  Review of Systems  Constitutional:       Per HPI, otherwise negative  HENT:       Per HPI, otherwise negative  Respiratory:       Per HPI, otherwise negative  Cardiovascular:       Per HPI, otherwise negative  Gastrointestinal: Negative for vomiting.  Endocrine:       Negative aside from HPI  Genitourinary:       Neg aside from HPI   Musculoskeletal:       Per HPI, otherwise negative  Skin: Positive for color change and wound.  Neurological: Negative for syncope.    Allergies  Review of patient's allergies indicates no known allergies.  Home Medications   Current Outpatient Rx  Name   Route  Sig  Dispense  Refill  . acetaminophen (TYLENOL) 500 MG tablet   Oral   Take 1,000 mg by mouth every 6 (six) hours as needed for pain.         Marland Kitchen amLODipine (NORVASC) 10 MG tablet      TAKE 1 TABLET BY MOUTH ONCE A DAY   30 tablet   3   . aspirin 81 MG tablet   Oral   Take 81 mg by mouth daily.           . carvedilol (COREG) 12.5 MG tablet      TAKE 1 TABLET BY MOUTH 2 TIMES DAILY WITH A MEAL.   60 tablet   2   . cetirizine (ZYRTEC) 10 MG tablet   Oral   Take 10 mg by mouth daily as needed.          . enalapril (VASOTEC) 20 MG tablet      TAKE 2 TABLETS BY MOUTH DAILY.   60 tablet   11   . gabapentin (NEURONTIN) 300 MG capsule      Take 300mg  every night for 1  week. If symptoms persist in the daytime you may take 3 times a day after 1 week.   90 capsule   3   . glucose blood (ONE TOUCH ULTRA TEST) test strip      Use as instructed          . hydrochlorothiazide (HYDRODIURIL) 25 MG tablet   Oral   Take 1 tablet (25 mg total) by mouth daily.   90 tablet   3   . ibuprofen (ADVIL,MOTRIN) 200 MG tablet   Oral   Take 400 mg by mouth every 6 (six) hours as needed for pain.         Marland Kitchen insulin aspart (NOVOLOG FLEXPEN) 100 UNIT/ML injection   Subcutaneous   Inject 7 Units into the skin 3 (three) times daily before meals.   1 vial   12   . insulin glargine (LANTUS) 100 UNIT/ML injection      INJECT 85 UNITS SUBCUTANEOUSLY Daily. Increase by 3 units daily for sugars greater than 200. Decrease by 3 units for sugars less than 100   30 mL   0   . Insulin Pen Needle 29G X 12MM MISC   Subcutaneous   Inject 1 pen into the skin 2 (two) times daily. Check blood sugar daily.   100 each   6     BP 139/69  Pulse 97  Temp(Src) 98.3 F (36.8 C) (Oral)  Resp 18  Wt 222 lb (100.699 kg)  BMI 38.09 kg/m2  SpO2 95%  LMP 09/18/2011  Physical Exam  Nursing note and vitals reviewed. Constitutional: She is oriented to person, place, and time. She  appears well-developed and well-nourished. No distress.  HENT:  Head: Normocephalic and atraumatic.  Eyes: Conjunctivae and EOM are normal.  Cardiovascular: Normal rate and regular rhythm.   Pulmonary/Chest: Effort normal and breath sounds normal. No stridor. No respiratory distress.  Abdominal: She exhibits no distension.  Musculoskeletal: She exhibits no edema.  Neurological: She is alert and oriented to person, place, and time. No cranial nerve deficit.  Skin:     Psychiatric: She has a normal mood and affect.    ED Course  Procedures (including critical care time)  Labs Reviewed - No data to display No results found.   No diagnosis found.  The patient was seen in urgent care, for here for further evaluation.  At urgent care she had blood work performed.  This is notable for leukocytosis with a value of 43,000, and hyperglycemia with a value of 258.  4:31 PM Patient resting, seemingly comfortably. We reviewed the indication for admission given her history of diabetes, the nonhealing ulcer and new concern for cellulitis. The patient refused admission, states that she is capable of following up for wound check tomorrow with her primary care physician. I educated him for admission, but again was rebuked.   MDM  This insulin-dependent diabetic presents with a nonhealing ulcer with new concern for cellulitis. The patient received antibiotics after outpatient labs demonstrated an impressive leukocytosis. X-ray does not demonstrate significant osteomyelitis. The patient's comorbidities I recommended admission for further intravenous antibiotics. The patient refused this recommendation, and although she is capacity to do so, I redirected the need for admission. Regardless, she requested discharge, and was made aware of the need to have a wound check tomorrow with her primary care physician. Although she is afebrile, there is concern for progressive infection. Wound care also  provided prior to D/C.          Herbie Baltimore  Vanita Panda, MD 05/14/12 [phone]

## 2012-05-15 ENCOUNTER — Inpatient Hospital Stay (HOSPITAL_COMMUNITY)
Admission: AD | Admit: 2012-05-15 | Discharge: 2012-05-20 | DRG: 603 | Disposition: A | Discharge status: Home / Self Care | Payer: 59 | Source: Ambulatory Visit | Attending: Family Medicine | Admitting: Family Medicine

## 2012-05-15 ENCOUNTER — Ambulatory Visit (INDEPENDENT_AMBULATORY_CARE_PROVIDER_SITE_OTHER): Payer: 59 | Admitting: Family Medicine

## 2012-05-15 ENCOUNTER — Encounter: Payer: Self-pay | Admitting: Family Medicine

## 2012-05-15 ENCOUNTER — Encounter (HOSPITAL_COMMUNITY): Payer: Self-pay | Admitting: Surgery

## 2012-05-15 VITALS — BP 135/82 | HR 89 | Temp 98.1°F | Ht 64.0 in | Wt 221.0 lb

## 2012-05-15 DIAGNOSIS — E1169 Type 2 diabetes mellitus with other specified complication: Secondary | ICD-10-CM

## 2012-05-15 DIAGNOSIS — E1149 Type 2 diabetes mellitus with other diabetic neurological complication: Secondary | ICD-10-CM | POA: Diagnosis present

## 2012-05-15 DIAGNOSIS — Z9119 Patient's noncompliance with other medical treatment and regimen: Secondary | ICD-10-CM

## 2012-05-15 DIAGNOSIS — K219 Gastro-esophageal reflux disease without esophagitis: Secondary | ICD-10-CM

## 2012-05-15 DIAGNOSIS — Z6837 Body mass index (BMI) 37.0-37.9, adult: Secondary | ICD-10-CM

## 2012-05-15 DIAGNOSIS — IMO0001 Reserved for inherently not codable concepts without codable children: Secondary | ICD-10-CM

## 2012-05-15 DIAGNOSIS — E11628 Type 2 diabetes mellitus with other skin complications: Secondary | ICD-10-CM

## 2012-05-15 DIAGNOSIS — D638 Anemia in other chronic diseases classified elsewhere: Secondary | ICD-10-CM | POA: Diagnosis present

## 2012-05-15 DIAGNOSIS — N183 Chronic kidney disease, stage 3 unspecified: Secondary | ICD-10-CM | POA: Diagnosis present

## 2012-05-15 DIAGNOSIS — I10 Essential (primary) hypertension: Secondary | ICD-10-CM | POA: Diagnosis present

## 2012-05-15 DIAGNOSIS — E1142 Type 2 diabetes mellitus with diabetic polyneuropathy: Secondary | ICD-10-CM | POA: Diagnosis present

## 2012-05-15 DIAGNOSIS — L089 Local infection of the skin and subcutaneous tissue, unspecified: Secondary | ICD-10-CM

## 2012-05-15 DIAGNOSIS — L03119 Cellulitis of unspecified part of limb: Principal | ICD-10-CM | POA: Diagnosis present

## 2012-05-15 DIAGNOSIS — Z794 Long term (current) use of insulin: Secondary | ICD-10-CM

## 2012-05-15 DIAGNOSIS — Z91199 Patient's noncompliance with other medical treatment and regimen due to unspecified reason: Secondary | ICD-10-CM

## 2012-05-15 DIAGNOSIS — L02619 Cutaneous abscess of unspecified foot: Principal | ICD-10-CM | POA: Diagnosis present

## 2012-05-15 DIAGNOSIS — E669 Obesity, unspecified: Secondary | ICD-10-CM

## 2012-05-15 DIAGNOSIS — R209 Unspecified disturbances of skin sensation: Secondary | ICD-10-CM

## 2012-05-15 DIAGNOSIS — E785 Hyperlipidemia, unspecified: Secondary | ICD-10-CM | POA: Diagnosis present

## 2012-05-15 DIAGNOSIS — E876 Hypokalemia: Secondary | ICD-10-CM | POA: Diagnosis present

## 2012-05-15 DIAGNOSIS — I129 Hypertensive chronic kidney disease with stage 1 through stage 4 chronic kidney disease, or unspecified chronic kidney disease: Secondary | ICD-10-CM | POA: Diagnosis present

## 2012-05-15 DIAGNOSIS — R200 Anesthesia of skin: Secondary | ICD-10-CM

## 2012-05-15 LAB — COMPREHENSIVE METABOLIC PANEL
ALT: 10 U/L (ref 0–35)
AST: 12 U/L (ref 0–37)
Albumin: 2.7 g/dL — ABNORMAL LOW (ref 3.5–5.2)
Alkaline Phosphatase: 162 U/L — ABNORMAL HIGH (ref 39–117)
BUN: 20 mg/dL (ref 6–23)
CO2: 25 mEq/L (ref 19–32)
Calcium: 9.6 mg/dL (ref 8.4–10.5)
Chloride: 100 mEq/L (ref 96–112)
Creatinine, Ser: 1.39 mg/dL — ABNORMAL HIGH (ref 0.50–1.10)
GFR calc Af Amer: 49 mL/min — ABNORMAL LOW (ref >90)
GFR calc non Af Amer: 42 mL/min — ABNORMAL LOW (ref >90)
Glucose, Bld: 201 mg/dL — ABNORMAL HIGH (ref 70–99)
Potassium: 2.8 mEq/L — ABNORMAL LOW (ref 3.5–5.1)
Sodium: 137 mEq/L (ref 135–145)
Total Bilirubin: 0.2 mg/dL — ABNORMAL LOW (ref 0.3–1.2)
Total Protein: 7.9 g/dL (ref 6.0–8.3)

## 2012-05-15 LAB — HEMOGLOBIN A1C
Hgb A1c MFr Bld: 11.6 % — ABNORMAL HIGH (ref <5.7)
Mean Plasma Glucose: 286 mg/dL — ABNORMAL HIGH (ref <117)

## 2012-05-15 LAB — GLUCOSE, CAPILLARY
Glucose-Capillary: 193 mg/dL — ABNORMAL HIGH (ref 70–99)
Glucose-Capillary: 141 mg/dL — ABNORMAL HIGH (ref 70–99)
Glucose-Capillary: 125 mg/dL — ABNORMAL HIGH (ref 70–99)

## 2012-05-15 LAB — CBC WITH DIFFERENTIAL/PLATELET
Basophils Absolute: 0 10*3/uL (ref 0.0–0.1)
Basophils Relative: 0 % (ref 0–1)
Eosinophils Absolute: 0.4 10*3/uL (ref 0.0–0.7)
Eosinophils Relative: 1 % (ref 0–5)
HCT: 28.7 % — ABNORMAL LOW (ref 36.0–46.0)
Hemoglobin: 10.1 g/dL — ABNORMAL LOW (ref 12.0–15.0)
Lymphocytes Relative: 22 % (ref 12–46)
Lymphs Abs: 8.6 10*3/uL — ABNORMAL HIGH (ref 0.7–4.0)
MCH: 27.9 pg (ref 26.0–34.0)
MCHC: 35.2 g/dL (ref 30.0–36.0)
MCV: 79.3 fL (ref 78.0–100.0)
Monocytes Absolute: 2.7 10*3/uL — ABNORMAL HIGH (ref 0.1–1.0)
Monocytes Relative: 7 % (ref 3–12)
Neutro Abs: 27.4 10*3/uL — ABNORMAL HIGH (ref 1.7–7.7)
Neutrophils Relative %: 70 % (ref 43–77)
Platelets: 408 10*3/uL — ABNORMAL HIGH (ref 150–400)
RBC: 3.62 MIL/uL — ABNORMAL LOW (ref 3.87–5.11)
RDW: 14 % (ref 11.5–15.5)
WBC: 39.1 10*3/uL — ABNORMAL HIGH (ref 4.0–10.5)
nRBC: 1 /100 WBC — ABNORMAL HIGH

## 2012-05-15 LAB — MAGNESIUM: Magnesium: 1.8 mg/dL (ref 1.5–2.5)

## 2012-05-15 MED ORDER — ASPIRIN 81 MG PO TABS: 81.0000 mg | ORAL_TABLET | Freq: Every day | ORAL | Status: DC

## 2012-05-15 MED ORDER — ONDANSETRON HCL 4 MG/2ML IJ SOLN: 4.0000 mg | Freq: Four times a day (QID) | INTRAMUSCULAR | Status: DC | PRN

## 2012-05-15 MED ORDER — HYDROCHLOROTHIAZIDE 25 MG PO TABS
25.0000 mg | ORAL_TABLET | Freq: Every day | ORAL | Status: DC
  Administered 2012-05-16 – 2012-05-20 (×5): 25 mg via ORAL
  Filled 2012-05-15 (×5): qty 1

## 2012-05-15 MED ORDER — LORATADINE 10 MG PO TABS
10.0000 mg | ORAL_TABLET | Freq: Every day | ORAL | Status: DC
  Administered 2012-05-16 – 2012-05-20 (×5): 10 mg via ORAL
  Filled 2012-05-15 (×6): qty 1

## 2012-05-15 MED ORDER — AMLODIPINE BESYLATE 10 MG PO TABS
10.0000 mg | ORAL_TABLET | Freq: Every day | ORAL | Status: DC
  Administered 2012-05-16 – 2012-05-20 (×5): 10 mg via ORAL
  Filled 2012-05-15 (×6): qty 1

## 2012-05-15 MED ORDER — ACETAMINOPHEN 650 MG RE SUPP: 650.0000 mg | Freq: Four times a day (QID) | RECTAL | Status: DC | PRN

## 2012-05-15 MED ORDER — GABAPENTIN 300 MG PO CAPS: 300.0000 mg | ORAL_CAPSULE | Freq: Three times a day (TID) | ORAL | Status: DC | PRN

## 2012-05-15 MED ORDER — VANCOMYCIN HCL 10 G IV SOLR
2000.0000 mg | Freq: Once | INTRAVENOUS | Status: AC
  Administered 2012-05-15: 2000 mg via INTRAVENOUS
  Filled 2012-05-15: qty 2000

## 2012-05-15 MED ORDER — ACETAMINOPHEN 325 MG PO TABS
650.0000 mg | ORAL_TABLET | Freq: Four times a day (QID) | ORAL | Status: DC | PRN
  Administered 2012-05-15 – 2012-05-19 (×9): 650 mg via ORAL
  Filled 2012-05-15 (×9): qty 2

## 2012-05-15 MED ORDER — ENOXAPARIN SODIUM 40 MG/0.4ML ~~LOC~~ SOLN
40.0000 mg | SUBCUTANEOUS | Status: DC
  Administered 2012-05-15 – 2012-05-20 (×6): 40 mg via SUBCUTANEOUS
  Filled 2012-05-15 (×6): qty 0.4

## 2012-05-15 MED ORDER — SODIUM CHLORIDE 0.9 % IV SOLN
250.0000 mL | INTRAVENOUS | Status: DC | PRN
  Administered 2012-05-15 – 2012-05-16 (×2): 250 mL via INTRAVENOUS

## 2012-05-15 MED ORDER — CARVEDILOL 12.5 MG PO TABS
12.5000 mg | ORAL_TABLET | Freq: Two times a day (BID) | ORAL | Status: DC
  Administered 2012-05-16 – 2012-05-18 (×5): 12.5 mg via ORAL
  Filled 2012-05-15 (×7): qty 1

## 2012-05-15 MED ORDER — PIPERACILLIN-TAZOBACTAM 3.375 G IVPB 30 MIN
3.3750 g | INTRAVENOUS | Status: AC
  Administered 2012-05-15: 3.375 g via INTRAVENOUS
  Filled 2012-05-15: qty 50

## 2012-05-15 MED ORDER — SODIUM CHLORIDE 0.9 % IJ SOLN
3.0000 mL | Freq: Two times a day (BID) | INTRAMUSCULAR | Status: DC
  Administered 2012-05-19: 3 mL via INTRAVENOUS

## 2012-05-15 MED ORDER — INSULIN ASPART 100 UNIT/ML ~~LOC~~ SOLN
0.0000 [IU] | Freq: Three times a day (TID) | SUBCUTANEOUS | Status: DC
  Administered 2012-05-15: 2 [IU] via SUBCUTANEOUS
  Administered 2012-05-15: 3 [IU] via SUBCUTANEOUS
  Administered 2012-05-16: 2 [IU] via SUBCUTANEOUS
  Administered 2012-05-17 – 2012-05-19 (×3): 3 [IU] via SUBCUTANEOUS
  Administered 2012-05-20 (×2): 2 [IU] via SUBCUTANEOUS

## 2012-05-15 MED ORDER — IBUPROFEN 800 MG PO TABS
400.0000 mg | ORAL_TABLET | Freq: Four times a day (QID) | ORAL | Status: DC | PRN
  Administered 2012-05-19: 400 mg via ORAL
  Filled 2012-05-15: qty 2

## 2012-05-15 MED ORDER — ASPIRIN EC 81 MG PO TBEC
81.0000 mg | DELAYED_RELEASE_TABLET | Freq: Every day | ORAL | Status: DC
  Administered 2012-05-16 – 2012-05-20 (×5): 81 mg via ORAL
  Filled 2012-05-15 (×5): qty 1

## 2012-05-15 MED ORDER — ONDANSETRON HCL 4 MG PO TABS: 4.0000 mg | ORAL_TABLET | Freq: Four times a day (QID) | ORAL | Status: DC | PRN

## 2012-05-15 MED ORDER — VANCOMYCIN HCL 10 G IV SOLR
1500.0000 mg | INTRAVENOUS | Status: DC
  Administered 2012-05-16 – 2012-05-20 (×5): 1500 mg via INTRAVENOUS
  Filled 2012-05-15 (×6): qty 1500

## 2012-05-15 MED ORDER — INSULIN GLARGINE 100 UNIT/ML ~~LOC~~ SOLN
85.0000 [IU] | Freq: Every day | SUBCUTANEOUS | Status: DC
  Administered 2012-05-16 – 2012-05-20 (×5): 85 [IU] via SUBCUTANEOUS
  Filled 2012-05-15 (×5): qty 0.85

## 2012-05-15 MED ORDER — PIPERACILLIN-TAZOBACTAM 3.375 G IVPB
3.3750 g | Freq: Three times a day (TID) | INTRAVENOUS | Status: DC
  Administered 2012-05-15 – 2012-05-20 (×14): 3.375 g via INTRAVENOUS
  Filled 2012-05-15 (×16): qty 50

## 2012-05-15 MED ORDER — SODIUM CHLORIDE 0.9 % IJ SOLN: 3.0000 mL | INTRAMUSCULAR | Status: DC | PRN

## 2012-05-15 MED ORDER — ENALAPRIL MALEATE 20 MG PO TABS
20.0000 mg | ORAL_TABLET | Freq: Every day | ORAL | Status: DC
  Administered 2012-05-16 – 2012-05-20 (×5): 20 mg via ORAL
  Filled 2012-05-15 (×5): qty 1

## 2012-05-15 NOTE — H&P (Signed)
[facility] Admission History and Physical  Patient name: Rachel Chaney Medical record number: [id number] Date of birth: 12-07-1960 Age: 52 y.o. Gender: female  Primary Care Provider: Linna Darner, MD  Chief Complaint: right foot pain, redness  History of Present Illness: Roby Mckinzey is a 52 y.o. year old female with poorly controlled diabetes with severe foot infection. She was seen initially in urgent care yesterday and the [facility] emergency department yesterday. It was recommended she stay inpatient for IV antibiotics, however she elected to leave. She began having pain, redness, swelling in her right foot approximately 2-3 days ago. She endorsed some slight drainage from the bottom. At urgent care her white blood count noted to be 43, and she was sent to ED for further evaluation. There she was dosed with IV Cipro and Bactrim in the ED, and was discharged with prescriptions for Cipro and Clinda which she has not started. She notes a possible mild decrease in the swelling and erythema laterally. Her fasting blood sugar this morning was 160 she reports.  She denies having decreased sensation in her foot or any history of neuropathy.  Review of Systems She denies any fever, chills, nausea, emesis, abdominal pain, dyspnea, cough, chest pain, polyuria.    Patient Active Problem List   Diagnosis Date Noted  . Numbness of feet 03/11/2012  . Abnormal appearance of cervix 08/08/2011  . Back pain 08/07/2011  . GERD (gastroesophageal reflux disease) 04/27/2010  . Type II or unspecified type diabetes mellitus without mention of complication, uncontrolled 04/02/2006  . HYPERLIPIDEMIA 03/08/2006  . OBESITY, NOS 03/08/2006  . HYPERTENSION, BENIGN SYSTEMIC 03/08/2006   Past Medical History: Past Medical History  Diagnosis Date  . Diabetes mellitus   . Hyperlipidemia   . Hypertension     Past Surgical History: Past Surgical History  Procedure Laterality Date   . Eye surgery Right     retinal detachment    Social History: History   Social History  . Marital Status: Married    Spouse Name: N/A    Number of Children: N/A  . Years of Education: N/A   Social History Main Topics  . Smoking status: Never Smoker   . Smokeless tobacco: Not on file  . Alcohol Use: No  . Drug Use: Not on file  . Sexually Active: Not on file   Other Topics Concern  . Not on file   Social History Narrative  . No narrative on file    Family History: Family History  Problem Relation Age of Onset  . Heart disease Mother   . Heart disease Father     Allergies: No Known Allergies  No current facility-administered medications for this encounter.   Current Outpatient Prescriptions  Medication Sig Dispense Refill  . acetaminophen (TYLENOL) 500 MG tablet Take 1,000 mg by mouth every 6 (six) hours as needed for pain.      Marland Kitchen amLODipine (NORVASC) 10 MG tablet TAKE 1 TABLET BY MOUTH ONCE A DAY  30 tablet  3  . aspirin 81 MG tablet Take 81 mg by mouth daily.        . carvedilol (COREG) 12.5 MG tablet TAKE 1 TABLET BY MOUTH 2 TIMES DAILY WITH A MEAL.  60 tablet  2  . cetirizine (ZYRTEC) 10 MG tablet Take 10 mg by mouth daily as needed.       . ciprofloxacin (CIPRO) 500 MG tablet Take 1 tablet (500 mg total) by mouth every 12 (twelve) hours.  14 tablet  0  . clindamycin (CLEOCIN) 150 MG capsule Take 2 capsules (300 mg total) by mouth 3 (three) times daily.  42 capsule  0  . enalapril (VASOTEC) 20 MG tablet TAKE 2 TABLETS BY MOUTH DAILY.  60 tablet  11  . gabapentin (NEURONTIN) 300 MG capsule Take 300mg  every night for 1 week. If symptoms persist in the daytime you may take 3 times a day after 1 week.  90 capsule  3  . glucose blood (ONE TOUCH ULTRA TEST) test strip Use as instructed       . hydrochlorothiazide (HYDRODIURIL) 25 MG tablet Take 1 tablet (25 mg total) by mouth daily.  90 tablet  3  . ibuprofen (ADVIL,MOTRIN) 200 MG tablet Take 400 mg by mouth every 6  (six) hours as needed for pain.      Marland Kitchen insulin aspart (NOVOLOG FLEXPEN) 100 UNIT/ML injection Inject 7 Units into the skin 3 (three) times daily before meals.  1 vial  12  . insulin glargine (LANTUS) 100 UNIT/ML injection INJECT 85 UNITS SUBCUTANEOUSLY Daily. Increase by 3 units daily for sugars greater than 200. Decrease by 3 units for sugars less than 100  30 mL  0  . Insulin Pen Needle 29G X 12MM MISC Inject 1 pen into the skin 2 (two) times daily. Check blood sugar daily.  100 each  6  . [DISCONTINUED] progesterone (PROMETRIUM) 200 MG capsule Take 1 capsule (200 mg total) by mouth daily. Take for 10 days if bleeding occurs for more than 4 days.  10 capsule  1   Review Of Systems: Per HPI with the following additions: See HPI otherwise negative. Otherwise 12 point review of systems was performed and was unremarkable.  Physical Exam: Pulse: 89  Blood Pressure: 135/82 RR: 12     Temp: 98.1  Physical Exam  Constitutional: She is oriented to person, place, and time. She appears well-developed and well-nourished. No distress.  HENT:  Head: Normocephalic and atraumatic.  Mouth/Throat: Oropharynx is clear and moist.  Eyes: EOM are normal. Pupils are equal, round, and reactive to light.  Neck: Neck supple. No thyromegaly present.  Cardiovascular: Normal rate, regular rhythm and normal heart sounds.   No murmur heard. Pulmonary/Chest: Effort normal and breath sounds normal. No respiratory distress. She has no wheezes. She has no rales.  Abdominal: Soft.  Musculoskeletal: She exhibits edema. She exhibits no tenderness.  Right foot has 1+ pitting edema and erythema extending to the mid shin range. There is an approximate 3 cm superficial fluid-filled Lister between the second and third digit dorsally. There is a 1.5 cm necrotic ulcer beneath the third metatarsal head. Unable to probe into subcutaneous tissue. The patient is nontender with palpation.  2+ right DP pulse.  Lymphadenopathy:    She has  no cervical adenopathy.  Neurological: She is alert and oriented to person, place, and time.  Inconsistent participation with monofilament testing.  Skin: She is not diaphoretic.    Labs and Imaging: Lab Results  Component Value Date/Time   NA 138 05/14/2012  1:59 PM   K 3.5 05/14/2012  1:59 PM   CL 99 05/14/2012  1:59 PM   CO2 25 05/14/2012  1:59 PM   BUN 20 05/14/2012  1:59 PM   CREATININE 1.41* 05/14/2012  1:59 PM   CREATININE 0.56 04/22/2009  9:29 PM   GLUCOSE 235* 05/14/2012  1:59 PM   Lab Results  Component Value Date   WBC 43.4* 05/14/2012   HGB 10.1* 05/14/2012  HCT 33.2* 05/14/2012   MCV 85.3 05/14/2012   PLT 345 10/14/2010   XR foot: soft tissue edema noted, no sign of osteomyelitis.  Assessment and Plan: Betzabel Vink is a 52 y.o. year old female with poorly controlled diabetes with severe diabetic foot infection.   1. Diabetic foot infection. Severe. Appears limb-threatening given degree of erythema/edema and poor diabetic control. Small necrotic ulcer most likely has been present for longer than 2-3 days, and probably is result of neuropathy. Plain film without evidence of osteo. WBC 43. No systemic signs of sepsis currently. Good peripheral pulse makes PAD less likely. I have drawn a line surrounding cellulitis today on my exam for future reference. -start empiric vanc/zosyn -follow up blood cultures -consider wound culture if dorsal blister is opened. -wound consult -consider MRI to rule out osteo.   2. DM2. Last A1c 14 this year. Reports mild hyperglycemia today.  -continue daily lantus (already taken today) -moderate SSI -she does not use meal time insulin at home.  3. HTN. Controlled. Continue home meds (coreg, enalapril, HCTZ, norvasc), already had morning doses.  4. Peripheral neuropathy. On gabapentin at home. Continue.   5. FEN/GI: SLIV. Check BMET.   6. Prophylaxis: renal dose lovenox.  7. Disposition: admit to med-surg for IV antibiotics. Full code.   Luis Abed, MD [facility] Resident - PGY-3 05/15/2012 11:11 AM

## 2012-05-15 NOTE — Patient Instructions (Addendum)
Go to the hospital

## 2012-05-15 NOTE — Progress Notes (Signed)
  Subjective:    Patient ID: Rachel Chaney, female    DOB: 02/19/1960, 52 y.o.   MRN: [id number]  HPI  52 year old poorly controlled diabetic with severe foot infection. She was seen initially in urgent care yesterday and the emergency department. He was recommended she stay in patient for IV antibiotics, however she elected to leave. Began having pain, redness, swelling in her right foot approximately 23 days ago. There was some slight drainage from the bottom. At urgent care her white blood count noted to be 43. The she was dosed with IV Cipro and Bactrim in the emergency department, was discharged with prescriptions for Cipro Clinda which she has not started. She notes a possible mild decrease in the swelling and erythema laterally. Her fasting blood sugar this morning was 160 she reports.  She denies having decreased sensation in her foot or any history of neuropathy.  Review of Systems She denies any fever, chills, nausea, emesis, abdominal pain, dyspnea, cough, chest pain, polyuria.      Objective:   Physical Exam  Constitutional: She is oriented to person, place, and time. She appears well-developed and well-nourished. No distress.  HENT:  Head: Normocephalic and atraumatic.  Mouth/Throat: Oropharynx is clear and moist.  Eyes: EOM are normal. Pupils are equal, round, and reactive to light.  Neck: Neck supple. No thyromegaly present.  Cardiovascular: Normal rate, regular rhythm and normal heart sounds.   No murmur heard. Pulmonary/Chest: Effort normal and breath sounds normal. No respiratory distress. She has no wheezes. She has no rales.  Abdominal: Soft.  Musculoskeletal: She exhibits edema. She exhibits no tenderness.  Right foot has 1+ pitting edema and erythema extending to the mid shin range. There is an approximate 3 cm superficial fluid-filled Lister between the second and third digit dorsally. There is a 1.5 cm necrotic ulcer beneath the third metatarsal head. Unable to  probe into subcutaneous tissue. The patient is nontender with palpation.  2+ right DP pulse.  Lymphadenopathy:    She has no cervical adenopathy.  Neurological: She is alert and oriented to person, place, and time.  Inconsistent participation with monofilament testing.  Skin: She is not diaphoretic.          Assessment & Plan:   Plan to admit to [facility] for IV antibiotics. Case d/w Dr. Doreene Nest.   For complete plan, see H&P for today.

## 2012-05-15 NOTE — Consult Note (Addendum)
WOC consult Note Reason for Consult: ulcer to right foot of unknown eitology; pt states it began blistering, swelling, and draining several days ago Wound type: full thickness wound to plantar surface right foot Pressure Ulcer POA: No Measurement: 2cm X 1cm Wound bed: 100% dry eschar, no odor or drainage. Periwound: Blistered area that tunnels between great toe and second toe to dorsal surface of right foot. 2nd toe dark purple.  Entire anterior foot area with erythemia, edema, and painful to touch. Dressing procedure/placement/frequency: Wet to dry placed, covered eschar area and blistered area, wrapped with kerlex until further recommendations available from ortho service.  Recommend MRI to R/O abscess and ortho consult R/T appearance of 2nd toe and possible circulatory compromise. Carroll Kinds RN, MSN Julien Girt MSN, RN, [place], [place], [place]

## 2012-05-15 NOTE — Progress Notes (Signed)
  Subjective:    Patient ID: Rachel Chaney, female    DOB: 1960-10-13, 52 y.o.   MRN: [id number]  HPI Discussed and examined patient with Dr. Verlee Rossetti in clinic for direct admission.  52 yo with poorly controlled diabetes (a1c 03/2012 14.0), peripheral neuropathy presents for follow-up of right foot cellulitis.  Patients reports 2-3 day history of erythema and redness.  Denies significant pain, fever. Went to Urgent care yesterday and was subsequently referred to ER.  WBC 43K, Foot xray shows no signs of osteomyelitis.  Patient declined admission.  Was given Clindamycin and Bactrim x 1 in ER and discharged with outpatient antibitotics.  Patient reports some decrease in swelling.  No fever.  Has not picked up antibiotics from pharmacy.    Review of Systems See HPI    Objective:   Physical Exam GEN: NAD Right foot:  Forefoot markedly erythematous and swollen.  Large flaccid bullae on top of foot, serous filled.  2nd toe most swollen.  Small eschar on bottom of foot around 2nd toe. Unable to probe.         Assessment & Plan:  52 yo with diabetic foot infection, at high risk for progression, failed outpatient therapy.  Diabetic foot infection:  Admit for broad spectrum IV antibiotics,  Vancomycin and zosyn.  XRAY of foot yesterday and exam/histroy of short duration of eschar on bottom of foot does not suggest osteomyelitis at this time. Continue to monitor.  Diabetes Mellitus: poorly controlled, continue home regimen and titrate as needed.  HTN: well controlled today, continue home meds

## 2012-05-15 NOTE — Progress Notes (Signed)
ANTIBIOTIC CONSULT NOTE - INITIAL  Pharmacy Consult for Vancomycin and Zosyn Indication: diabetic foot ulcer  No Known Allergies  Patient Measurements: Height: 5\' 4"  (162.6 cm) Weight: 221 lb (100.245 kg) IBW/kg (Calculated) : 54.7 Adjusted Body Weight: 69 kg  Vital Signs: Temp: 98.2 F (36.8 C) (05/07 1124) Temp src: Oral (05/07 1124) BP: 143/65 mmHg (05/07 1124) Pulse Rate: 90 (05/07 1124) Intake/Output from previous day:   Intake/Output from this shift:    Labs:  Recent Labs  05/14/12 1358 05/14/12 1359  WBC 43.4*  --   HGB 10.1*  --   CREATININE  --  1.41*   Estimated Creatinine Clearance: 53.1 ml/min (by C-G formula based on Cr of 1.41). No results found for this basename: VANCOTROUGH, VANCOPEAK, VANCORANDOM, GENTTROUGH, GENTPEAK, GENTRANDOM, TOBRATROUGH, TOBRAPEAK, TOBRARND, AMIKACINPEAK, AMIKACINTROU, AMIKACIN,  in the last 72 hours   Microbiology: Recent Results (from the past 720 hour(s))  CULTURE, BLOOD (ROUTINE X 2)     Status: None   Collection Time    05/14/12  3:47 PM      Result Value Range Status   Specimen Description BLOOD RIGHT ARM   Final   Special Requests BOTTLES DRAWN AEROBIC AND ANAEROBIC 5CC   Final   Culture  Setup Time 05/14/2012 21:38   Final   Culture     Final   Value:        BLOOD CULTURE RECEIVED NO GROWTH TO DATE CULTURE WILL BE HELD FOR 5 DAYS BEFORE ISSUING A FINAL NEGATIVE REPORT   Report Status PENDING   Incomplete  CULTURE, BLOOD (ROUTINE X 2)     Status: None   Collection Time    05/14/12  4:05 PM      Result Value Range Status   Specimen Description BLOOD LEFT ARM   Final   Special Requests BOTTLES DRAWN AEROBIC ONLY 2CC   Final   Culture  Setup Time 05/14/2012 21:38   Final   Culture     Final   Value:        BLOOD CULTURE RECEIVED NO GROWTH TO DATE CULTURE WILL BE HELD FOR 5 DAYS BEFORE ISSUING A FINAL NEGATIVE REPORT   Report Status PENDING   Incomplete    Medical History: Past Medical History  Diagnosis Date   . Diabetes mellitus   . Hyperlipidemia   . Hypertension     Medications:  Prescriptions prior to admission  Medication Sig Dispense Refill  . acetaminophen (TYLENOL) 500 MG tablet Take 1,000 mg by mouth every 6 (six) hours as needed for pain.      Marland Kitchen amLODipine (NORVASC) 10 MG tablet TAKE 1 TABLET BY MOUTH ONCE A DAY  30 tablet  3  . aspirin 81 MG tablet Take 81 mg by mouth daily.        . carvedilol (COREG) 12.5 MG tablet TAKE 1 TABLET BY MOUTH 2 TIMES DAILY WITH A MEAL.  60 tablet  2  . cetirizine (ZYRTEC) 10 MG tablet Take 10 mg by mouth daily as needed.       . ciprofloxacin (CIPRO) 500 MG tablet Take 1 tablet (500 mg total) by mouth every 12 (twelve) hours.  14 tablet  0  . clindamycin (CLEOCIN) 150 MG capsule Take 2 capsules (300 mg total) by mouth 3 (three) times daily.  42 capsule  0  . enalapril (VASOTEC) 20 MG tablet TAKE 2 TABLETS BY MOUTH DAILY.  60 tablet  11  . gabapentin (NEURONTIN) 300 MG capsule Take 300mg  every night  for 1 week. If symptoms persist in the daytime you may take 3 times a day after 1 week.  90 capsule  3  . glucose blood (ONE TOUCH ULTRA TEST) test strip Use as instructed       . hydrochlorothiazide (HYDRODIURIL) 25 MG tablet Take 1 tablet (25 mg total) by mouth daily.  90 tablet  3  . ibuprofen (ADVIL,MOTRIN) 200 MG tablet Take 400 mg by mouth every 6 (six) hours as needed for pain.      Marland Kitchen insulin aspart (NOVOLOG FLEXPEN) 100 UNIT/ML injection Inject 7 Units into the skin 3 (three) times daily before meals.  1 vial  12  . insulin glargine (LANTUS) 100 UNIT/ML injection INJECT 85 UNITS SUBCUTANEOUSLY Daily. Increase by 3 units daily for sugars greater than 200. Decrease by 3 units for sugars less than 100  30 mL  0  . Insulin Pen Needle 29G X 12MM MISC Inject 1 pen into the skin 2 (two) times daily. Check blood sugar daily.  100 each  6   Assessment: 52 y.o. female presents with R foot pain and redness - diabetic foot ulcer which appears limb threatening. Pt  went to Urgent Care yesterday and was given IV Cipro and Bactrim (sent home with Rx for Cipro and Clinda which she has not started). Wbc up to 43.  Xray w/o evidence of osteo. Afeb.  Goal of Therapy:  Vancomycin trough level 10-15 mcg/ml; if osteo found, will need trough 15-20 mcg/ml  Plan:  1) Vancomcyin 2gm IV x 1 now then 1500mg  IV q24h. 2) Zosyn 3.375gm IV over 30 minutes now then 3.375gm IV q8h (subsequent doses over 4 hours) 3) Will f/u microbiological data, renal function, and pt's clinical condition.  Sherlon Handing, PharmD, BCPS Clinical pharmacist, pager [phone] 05/15/2012,12:00 PM

## 2012-05-15 NOTE — Progress Notes (Signed)
Patient seen by me in the hospital,she denies any pain,feels comfortable,no fever in the last few days. She was transferred here from the [facility] for right foot ulcer which has been ongoing for few days,she denies any form of trauma or insect bite that she is aware of.  O/E: Comfortable in Bed.         V/S stable          Resp/CV: S1S2 no murmurs,air entry equal B/L no wheezing.          GI: Benign.          [id number] LL Normal.                Right LL: Right foot swollen from her toe to few cm above the ankle with mild to moderate erythema or the dorsum of her right foot,there is an ulceration on the ventral surface of her foot just between her biff toe and 2nd. Superficial bullae also noted on the dorsum.Very mild tenderness of her right foot.  Lab and imaging reviewed.  A/P: Right foot cellulitis in a DM patient/DM foot.         Recent xray did not show any bone abnormality,might not need MRI at this time.         She was started on antibiotic,not clear if wound culture was already done from the clinic.         Continue IV A/B while awaiting blood culture report.           DM: Uncontrolled:               Continue Lantus and sliding scale,we will adjust medication as appropriate.

## 2012-05-15 NOTE — H&P (Signed)
Examined and discussed with Dr. Verlee Rossetti.  Agree with documentation.  See office note for my full note.

## 2012-05-16 ENCOUNTER — Inpatient Hospital Stay (HOSPITAL_COMMUNITY): Payer: 59

## 2012-05-16 DIAGNOSIS — E669 Obesity, unspecified: Secondary | ICD-10-CM

## 2012-05-16 LAB — GLUCOSE, CAPILLARY
Glucose-Capillary: 114 mg/dL — ABNORMAL HIGH (ref 70–99)
Glucose-Capillary: 147 mg/dL — ABNORMAL HIGH (ref 70–99)
Glucose-Capillary: 98 mg/dL (ref 70–99)
Glucose-Capillary: 78 mg/dL (ref 70–99)

## 2012-05-16 LAB — CBC
HCT: 28.1 % — ABNORMAL LOW (ref 36.0–46.0)
Hemoglobin: 9.2 g/dL — ABNORMAL LOW (ref 12.0–15.0)
MCH: 26.6 pg (ref 26.0–34.0)
MCHC: 32.7 g/dL (ref 30.0–36.0)
MCV: 81.2 fL (ref 78.0–100.0)
Platelets: 412 10*3/uL — ABNORMAL HIGH (ref 150–400)
RBC: 3.46 MIL/uL — ABNORMAL LOW (ref 3.87–5.11)
RDW: 14.5 % (ref 11.5–15.5)
WBC: 32.5 10*3/uL — ABNORMAL HIGH (ref 4.0–10.5)

## 2012-05-16 LAB — BASIC METABOLIC PANEL
BUN: 16 mg/dL (ref 6–23)
CO2: 23 mEq/L (ref 19–32)
Calcium: 8.8 mg/dL (ref 8.4–10.5)
Chloride: 105 mEq/L (ref 96–112)
Creatinine, Ser: 1.3 mg/dL — ABNORMAL HIGH (ref 0.50–1.10)
GFR calc Af Amer: 53 mL/min — ABNORMAL LOW (ref >90)
GFR calc non Af Amer: 46 mL/min — ABNORMAL LOW (ref >90)
Glucose, Bld: 93 mg/dL (ref 70–99)
Potassium: 2.8 mEq/L — ABNORMAL LOW (ref 3.5–5.1)
Sodium: 142 mEq/L (ref 135–145)

## 2012-05-16 MED ORDER — GADOBENATE DIMEGLUMINE 529 MG/ML IV SOLN
20.0000 mL | Freq: Once | INTRAVENOUS | Status: AC
  Administered 2012-05-16: 20 mL via INTRAVENOUS

## 2012-05-16 MED ORDER — POTASSIUM CHLORIDE CRYS ER 20 MEQ PO TBCR
30.0000 meq | EXTENDED_RELEASE_TABLET | Freq: Two times a day (BID) | ORAL | Status: AC
  Administered 2012-05-16 – 2012-05-17 (×3): 30 meq via ORAL
  Filled 2012-05-16 (×3): qty 1

## 2012-05-16 NOTE — Progress Notes (Signed)
UR completed. Magdalen Spatz RNBSN

## 2012-05-16 NOTE — Progress Notes (Signed)
FMTS Attending Admission Note: Jermine Bibbee,MD I  have seen and examined this patient, reviewed their chart. I have discussed this patient with the resident. I agree with the resident's findings, assessment and care plan.  

## 2012-05-16 NOTE — Progress Notes (Signed)
Family Medicine Teaching Service Daily Progress Note Service Page: [phone]  Subjective: feels like leg is better.  Objective: Temp:  [97.4 F (36.3 C)-98.6 F (37 C)] 97.7 F (36.5 C) (05/08 0618) Pulse Rate:  [87-93] 87 (05/08 0618) Resp:  [18-20] 18 (05/08 0618) BP: (125-147)/(61-89) 147/67 mmHg (05/08 0618) SpO2:  [95 %-100 %] 99 % (05/08 0618) Weight:  [221 lb (100.245 kg)] 221 lb (100.245 kg) (05/07 1124) Exam: General: NAD< resting comfortably in bed  Cardiovascular: rrr, no mrg Respiratory: CTAB, no wheezes or crackles appreciated Extremities: RLE erythema improved and no winside previously drawn lines, no drainage from ulceration on bottom of foot or from blister between toes  CBC BMET   Recent Labs Lab 05/14/12 1358 05/15/12 1134 05/16/12 0500  WBC 43.4* 39.1* 32.5*  HGB 10.1* 10.1* 9.2*  HCT 33.2* 28.7* 28.1*  PLT  --  408* 412*    Recent Labs Lab 05/14/12 1359 05/15/12 1134 05/16/12 0500  NA 138 137 142  K 3.5 2.8* 2.8*  CL 99 100 105  CO2 25 25 23   BUN 20 20 16   CREATININE 1.41* 1.39* 1.30*  GLUCOSE 235* 201* 93  CALCIUM 9.1 9.6 8.8     Results for orders placed during the hospital encounter of 05/15/12 (from the past 24 hour(s))  COMPREHENSIVE METABOLIC PANEL     Status: Abnormal   Collection Time    05/15/12 11:34 AM      Result Value Range   Sodium 137  135 - 145 mEq/L   Potassium 2.8 (*) 3.5 - 5.1 mEq/L   Chloride 100  96 - 112 mEq/L   CO2 25  19 - 32 mEq/L   Glucose, Bld 201 (*) 70 - 99 mg/dL   BUN 20  6 - 23 mg/dL   Creatinine, Ser 1.39 (*) 0.50 - 1.10 mg/dL   Calcium 9.6  8.4 - 10.5 mg/dL   Total Protein 7.9  6.0 - 8.3 g/dL   Albumin 2.7 (*) 3.5 - 5.2 g/dL   AST 12  0 - 37 U/L   ALT 10  0 - 35 U/L   Alkaline Phosphatase 162 (*) 39 - 117 U/L   Total Bilirubin 0.2 (*) 0.3 - 1.2 mg/dL   GFR calc non Af Amer 42 (*) >90 mL/min   GFR calc Af Amer 49 (*) >90 mL/min  MAGNESIUM     Status: None   Collection Time    05/15/12 11:34 AM       Result Value Range   Magnesium 1.8  1.5 - 2.5 mg/dL  CBC WITH DIFFERENTIAL     Status: Abnormal   Collection Time    05/15/12 11:34 AM      Result Value Range   WBC 39.1 (*) 4.0 - 10.5 K/uL   RBC 3.62 (*) 3.87 - 5.11 MIL/uL   Hemoglobin 10.1 (*) 12.0 - 15.0 g/dL   HCT 28.7 (*) 36.0 - 46.0 %   MCV 79.3  78.0 - 100.0 fL   MCH 27.9  26.0 - 34.0 pg   MCHC 35.2  30.0 - 36.0 g/dL   RDW 14.0  11.5 - 15.5 %   Platelets 408 (*) 150 - 400 K/uL   Neutrophils Relative 70  43 - 77 %   Lymphocytes Relative 22  12 - 46 %   Monocytes Relative 7  3 - 12 %   Eosinophils Relative 1  0 - 5 %   Basophils Relative 0  0 - 1 %  nRBC 1 (*) 0 /100 WBC   Neutro Abs 27.4 (*) 1.7 - 7.7 K/uL   Lymphs Abs 8.6 (*) 0.7 - 4.0 K/uL   Monocytes Absolute 2.7 (*) 0.1 - 1.0 K/uL   Eosinophils Absolute 0.4  0.0 - 0.7 K/uL   Basophils Absolute 0.0  0.0 - 0.1 K/uL   RBC Morphology RARE NRBCs     WBC Morphology MILD LEFT SHIFT (1-5% METAS, OCC MYELO, OCC BANDS)    HEMOGLOBIN A1C     Status: Abnormal   Collection Time    05/15/12 11:34 AM      Result Value Range   Hemoglobin A1C 11.6 (*) <5.7 %   Mean Plasma Glucose 286 (*) <117 mg/dL  GLUCOSE, CAPILLARY     Status: Abnormal   Collection Time    05/15/12 11:59 AM      Result Value Range   Glucose-Capillary 193 (*) 70 - 99 mg/dL  GLUCOSE, CAPILLARY     Status: Abnormal   Collection Time    05/15/12  4:47 PM      Result Value Range   Glucose-Capillary 141 (*) 70 - 99 mg/dL  GLUCOSE, CAPILLARY     Status: Abnormal   Collection Time    05/15/12 10:54 PM      Result Value Range   Glucose-Capillary 125 (*) 70 - 99 mg/dL  BASIC METABOLIC PANEL     Status: Abnormal   Collection Time    05/16/12  5:00 AM      Result Value Range   Sodium 142  135 - 145 mEq/L   Potassium 2.8 (*) 3.5 - 5.1 mEq/L   Chloride 105  96 - 112 mEq/L   CO2 23  19 - 32 mEq/L   Glucose, Bld 93  70 - 99 mg/dL   BUN 16  6 - 23 mg/dL   Creatinine, Ser 1.30 (*) 0.50 - 1.10 mg/dL    Calcium 8.8  8.4 - 10.5 mg/dL   GFR calc non Af Amer 46 (*) >90 mL/min   GFR calc Af Amer 53 (*) >90 mL/min  CBC     Status: Abnormal   Collection Time    05/16/12  5:00 AM      Result Value Range   WBC 32.5 (*) 4.0 - 10.5 K/uL   RBC 3.46 (*) 3.87 - 5.11 MIL/uL   Hemoglobin 9.2 (*) 12.0 - 15.0 g/dL   HCT 28.1 (*) 36.0 - 46.0 %   MCV 81.2  78.0 - 100.0 fL   MCH 26.6  26.0 - 34.0 pg   MCHC 32.7  30.0 - 36.0 g/dL   RDW 14.5  11.5 - 15.5 %   Platelets 412 (*) 150 - 400 K/uL  GLUCOSE, CAPILLARY     Status: Abnormal   Collection Time    05/16/12  7:59 AM      Result Value Range   Glucose-Capillary 114 (*) 70 - 99 mg/dL   Imaging/Diagnostic Tests: Dg Foot Complete Right  05/14/2012  *RADIOLOGY REPORT*  Clinical Data: Right foot swelling and pain.  Wound between great and second toes.  RIGHT FOOT COMPLETE - 3+ VIEW  Comparison: None  Findings: There is no evidence of fracture, subluxation or dislocation. No radiographic evidence of osteomyelitis is identified. The Lisfranc joints are intact. Diffuse soft tissue swelling is present. A small to moderate calcaneal spur is present. No other focal bony abnormalities are noted.  IMPRESSION: Soft tissue swelling without acute bony abnormality.   Original Report Authenticated By: Dellis Filbert  Melanee Spry, M.D.    Assessment/Plan: Rachel Chaney is a 52 y.o. year old female with poorly controlled diabetes with severe diabetic foot infection.   1. Diabetic foot infection: improved. Plain film with no evidence of osteomyelitis. WBC trending down. No systemic signs of sepsis currently.   -continue empiric vanc/zosyn, no wound cultures available to guide therapy-will likely need another day of IV antibiotics prior to transitioning to PO -follow up blood cultures-NGTD   -wound consult  -will order MRI to evaluate for osteo   2. DM2. Last A1c 14 this year.  -A1c 11.6  -continue daily lantus 85 u  -moderate SSI -she does not use meal time insulin at home. -will  need to consider increase in lantus and addition of meal time insulin at home   3. HTN. Controlled.  -Continue home meds (coreg, enalapril, HCTZ, norvasc)   4. Peripheral neuropathy.  -continue home gabapentin   5. Hypokalemia: 2.8 this morning. -will replete with KDur 40 mEq BI for 3 doses  FEN/GI: SLIV.  Prophylaxis: renal dose lovenox.  Disposition: admit to med-surg for IV antibiotics. Discharge pending continued improvement in infection.  Code: Full code.    [name], MD 05/16/2012, 9:53 AM

## 2012-05-17 DIAGNOSIS — K219 Gastro-esophageal reflux disease without esophagitis: Secondary | ICD-10-CM

## 2012-05-17 DIAGNOSIS — E785 Hyperlipidemia, unspecified: Secondary | ICD-10-CM

## 2012-05-17 LAB — BASIC METABOLIC PANEL
BUN: 10 mg/dL (ref 6–23)
CO2: 24 mEq/L (ref 19–32)
Calcium: 9.3 mg/dL (ref 8.4–10.5)
Chloride: 103 mEq/L (ref 96–112)
Creatinine, Ser: 1.14 mg/dL — ABNORMAL HIGH (ref 0.50–1.10)
GFR calc Af Amer: 62 mL/min — ABNORMAL LOW (ref >90)
GFR calc non Af Amer: 54 mL/min — ABNORMAL LOW (ref >90)
Glucose, Bld: 226 mg/dL — ABNORMAL HIGH (ref 70–99)
Potassium: 3.4 mEq/L — ABNORMAL LOW (ref 3.5–5.1)
Sodium: 138 mEq/L (ref 135–145)

## 2012-05-17 LAB — CBC
HCT: 29.2 % — ABNORMAL LOW (ref 36.0–46.0)
Hemoglobin: 9.3 g/dL — ABNORMAL LOW (ref 12.0–15.0)
MCH: 26.7 pg (ref 26.0–34.0)
MCHC: 31.8 g/dL (ref 30.0–36.0)
MCV: 83.9 fL (ref 78.0–100.0)
Platelets: 398 10*3/uL (ref 150–400)
RBC: 3.48 MIL/uL — ABNORMAL LOW (ref 3.87–5.11)
RDW: 14.6 % (ref 11.5–15.5)
WBC: 31 10*3/uL — ABNORMAL HIGH (ref 4.0–10.5)

## 2012-05-17 LAB — GLUCOSE, CAPILLARY
Glucose-Capillary: 114 mg/dL — ABNORMAL HIGH (ref 70–99)
Glucose-Capillary: 194 mg/dL — ABNORMAL HIGH (ref 70–99)
Glucose-Capillary: 103 mg/dL — ABNORMAL HIGH (ref 70–99)
Glucose-Capillary: 94 mg/dL (ref 70–99)

## 2012-05-17 MED ORDER — POTASSIUM CHLORIDE CRYS ER 20 MEQ PO TBCR
40.0000 meq | EXTENDED_RELEASE_TABLET | Freq: Two times a day (BID) | ORAL | Status: AC
  Administered 2012-05-17 (×2): 40 meq via ORAL
  Filled 2012-05-17 (×2): qty 2

## 2012-05-17 NOTE — Progress Notes (Signed)
[facility] Attending Admission Note: Merial Moritz,MD I  have seen and examined this patient, reviewed their chart. I have discussed this patient with the resident. I agree with the resident's findings, assessment and care plan.  Patient seem to be doing better since admission,afebrile mostly with no pain.Foot swelling and erythema improving,blood culture negative so far,continue A/B for now. She will benefit from orthopedic assessment rather than plastic surgeon for plantar ulcer debridement.Consult to ortho will be put in. Continue current DM regimen and BP meds.

## 2012-05-17 NOTE — Progress Notes (Signed)
Family Medicine Teaching Service Daily Progress Note Service Page: [phone]  Subjective: feels like leg is better.  Objective: Temp:  [98.2 F (36.8 C)-99.1 F (37.3 C)] 98.2 F (36.8 C) (05/09 0641) Pulse Rate:  [84-89] 89 (05/09 0641) Resp:  [18] 18 (05/09 0641) BP: (129-145)/(63-72) 145/72 mmHg (05/09 0641) SpO2:  [99 %-100 %] 100 % (05/09 0641) Exam: General: NAD, resting comfortably in chair  Cardiovascular: rrr, no mrg Respiratory: CTAB, no wheezes or crackles appreciated Extremities: RLE erythema improved and now inside previously drawn lines, now with drainage from blister in between toes  CBC BMET   Recent Labs Lab 05/14/12 1358 05/15/12 1134 05/16/12 0500  WBC 43.4* 39.1* 32.5*  HGB 10.1* 10.1* 9.2*  HCT 33.2* 28.7* 28.1*  PLT  --  408* 412*    Recent Labs Lab 05/14/12 1359 05/15/12 1134 05/16/12 0500  NA 138 137 142  K 3.5 2.8* 2.8*  CL 99 100 105  CO2 25 25 23   BUN 20 20 16   CREATININE 1.41* 1.39* 1.30*  GLUCOSE 235* 201* 93  CALCIUM 9.1 9.6 8.8     Results for orders placed during the hospital encounter of 05/15/12 (from the past 24 hour(s))  GLUCOSE, CAPILLARY     Status: Abnormal   Collection Time    05/16/12  1:22 PM      Result Value Range   Glucose-Capillary 147 (*) 70 - 99 mg/dL  GLUCOSE, CAPILLARY     Status: None   Collection Time    05/16/12  4:41 PM      Result Value Range   Glucose-Capillary 98  70 - 99 mg/dL  GLUCOSE, CAPILLARY     Status: None   Collection Time    05/16/12  9:29 PM      Result Value Range   Glucose-Capillary 78  70 - 99 mg/dL  GLUCOSE, CAPILLARY     Status: Abnormal   Collection Time    05/17/12  7:48 AM      Result Value Range   Glucose-Capillary 114 (*) 70 - 99 mg/dL   Comment 1 Notify RN     Imaging/Diagnostic Tests: Mr Foot Right W Wo Contrast  05/16/2012  *RADIOLOGY REPORT*  Clinical Data: Ulcer along the dorsal foot.  Query osteomyelitis. Cellulitis.  MRI OF THE RIGHT FOREFOOT WITHOUT AND WITH  CONTRAST  Technique:  Multiplanar, multisequence MR imaging was performed both before and after administration of intravenous contrast.  Contrast: 38mL MULTIHANCE GADOBENATE DIMEGLUMINE 529 MG/ML IV SOLN  Comparison: 05/14/2012  Findings: Despite efforts by the patient and technologist, motion artifact is present on some series of today's examination and could not be totally eliminated.  This reduces diagnostic sensitivity and specificity.  Abnormal lack of enhancement noted in the soft tissues plantar to the second metatarsophalangeal joint, and extending superiorly to the medial side of the shaft of the proximal phalanx of the second toe into the subcutaneous tissues dorsal to the proximal phalanx of the second toe.  Associated skin irregularity noted along the plantar extent of this process on image 11 of series 10, and extending towards the crease between the first and second toes on image 13 of series 10.  No gas is identified within this process.  No marrow edema or enhancement to suggest osteomyelitis.  There is diffuse dorsal subcutaneous edema along with edema tracking in the distal plantar musculature of the foot and along the plantar - medial side of the foot with associated enhancement as shown on images 8-15 of series  2.  Abnormal enhancement appears to extend into the toes, particularly the second toe.  IMPRESSION:  1.  Cellulitis of the distal foot potentially with distal myositis. No osteomyelitis observed. 2.  Abnormal region of low enhancement tracking from the plantar to the dorsal soft tissues between the first and second digits, but primarily around the second digit at the level of the metatarsophalangeal joint.  Appearance may reflect tissue necrosis or early abscess.  No definite gas observed.   Original Report Authenticated By: Van Clines, M.D.    Assessment/Plan: Rachel Chaney is a 52 y.o. year old female with poorly controlled diabetes with severe diabetic foot infection.   1.  Diabetic foot infection: improved. Plain film with no evidence of osteomyelitis. WBC trending down. No systemic signs of sepsis currently.   -continue empiric vanc/zosyn, no wound cultures available to guide therapy-placed call to plastic surgeon on call to see if they can evaluate the patient -follow up blood cultures-NGTD   -wound consult  -MRI revealed possible tissue necrosis vs abscess-will consult plastic surgery for eval  -f/u CBC  2. DM2. Last A1c 14 this year.  -A1c 11.6  -continue daily lantus 85 u  -moderate SSI -she does not use meal time insulin at home. -will need to consider increase in lantus and addition of meal time insulin at home-though has been well controlled on lantus in the hospital, so may be non-compliant   3. HTN. Controlled.  -Continue home meds (coreg, enalapril, HCTZ, norvasc)   4. Peripheral neuropathy.  -continue home gabapentin   5. Hypokalemia: 2.8 this morning. -will replete with KDur 40 mEq BI for 3 doses -f/u BMET this morning  FEN/GI: SLIV.  Prophylaxis: renal dose lovenox.  Disposition: Discharge pending continued improvement in infection.  Code: Full code.    [name], MD 05/17/2012, 9:23 AM

## 2012-05-17 NOTE — Consult Note (Signed)
Reason for Consult: Diabetic insensate neuropathy with cellulitis and blistering right foot Referring Physician: Janise Greenly is an 52 y.o. female.  HPI: Patient is a 52 year old woman with diabetes hypertension peripheral vascular disease who states she noticed a 3 day history of swelling and redness in her right foot prior to being admitted. She has been on 2 days of IV vancomycin and Zosyn is showing rapid improvement of her cellulitis.  Past Medical History  Diagnosis Date  . Diabetes mellitus   . Hyperlipidemia   . Hypertension     Past Surgical History  Procedure Laterality Date  . Eye surgery Right     retinal detachment    Family History  Problem Relation Age of Onset  . Heart disease Mother   . Heart disease Father     Social History:  reports that she has never smoked. She does not have any smokeless tobacco history on file. She reports that she does not drink alcohol. Her drug history is not on file.  Allergies: No Known Allergies  Medications: I have reviewed the patient's current medications.  Results for orders placed during the hospital encounter of 05/15/12 (from the past 48 hour(s))  GLUCOSE, CAPILLARY     Status: Abnormal   Collection Time    05/15/12  4:47 PM      Result Value Range   Glucose-Capillary 141 (*) 70 - 99 mg/dL  GLUCOSE, CAPILLARY     Status: Abnormal   Collection Time    05/15/12 10:54 PM      Result Value Range   Glucose-Capillary 125 (*) 70 - 99 mg/dL  BASIC METABOLIC PANEL     Status: Abnormal   Collection Time    05/16/12  5:00 AM      Result Value Range   Sodium 142  135 - 145 mEq/L   Potassium 2.8 (*) 3.5 - 5.1 mEq/L   Chloride 105  96 - 112 mEq/L   CO2 23  19 - 32 mEq/L   Glucose, Bld 93  70 - 99 mg/dL   BUN 16  6 - 23 mg/dL   Creatinine, Ser 1.30 (*) 0.50 - 1.10 mg/dL   Calcium 8.8  8.4 - 10.5 mg/dL   GFR calc non Af Amer 46 (*) >90 mL/min   GFR calc Af Amer 53 (*) >90 mL/min   Comment:            The eGFR  has been calculated     using the CKD EPI equation.     This calculation has not been     validated in all clinical     situations.     eGFR's persistently     <90 mL/min signify     possible Chronic Kidney Disease.  CBC     Status: Abnormal   Collection Time    05/16/12  5:00 AM      Result Value Range   WBC 32.5 (*) 4.0 - 10.5 K/uL   RBC 3.46 (*) 3.87 - 5.11 MIL/uL   Hemoglobin 9.2 (*) 12.0 - 15.0 g/dL   HCT 28.1 (*) 36.0 - 46.0 %   MCV 81.2  78.0 - 100.0 fL   MCH 26.6  26.0 - 34.0 pg   MCHC 32.7  30.0 - 36.0 g/dL   RDW 14.5  11.5 - 15.5 %   Platelets 412 (*) 150 - 400 K/uL  GLUCOSE, CAPILLARY     Status: Abnormal   Collection Time    05/16/12  7:59 AM      Result Value Range   Glucose-Capillary 114 (*) 70 - 99 mg/dL  GLUCOSE, CAPILLARY     Status: Abnormal   Collection Time    05/16/12  1:22 PM      Result Value Range   Glucose-Capillary 147 (*) 70 - 99 mg/dL  GLUCOSE, CAPILLARY     Status: None   Collection Time    05/16/12  4:41 PM      Result Value Range   Glucose-Capillary 98  70 - 99 mg/dL  GLUCOSE, CAPILLARY     Status: None   Collection Time    05/16/12  9:29 PM      Result Value Range   Glucose-Capillary 78  70 - 99 mg/dL  GLUCOSE, CAPILLARY     Status: Abnormal   Collection Time    05/17/12  7:48 AM      Result Value Range   Glucose-Capillary 114 (*) 70 - 99 mg/dL   Comment 1 Notify RN    CBC     Status: Abnormal   Collection Time    05/17/12  9:40 AM      Result Value Range   WBC 31.0 (*) 4.0 - 10.5 K/uL   RBC 3.48 (*) 3.87 - 5.11 MIL/uL   Hemoglobin 9.3 (*) 12.0 - 15.0 g/dL   HCT 29.2 (*) 36.0 - 46.0 %   MCV 83.9  78.0 - 100.0 fL   MCH 26.7  26.0 - 34.0 pg   MCHC 31.8  30.0 - 36.0 g/dL   RDW 14.6  11.5 - 15.5 %   Platelets 398  150 - 400 K/uL  BASIC METABOLIC PANEL     Status: Abnormal   Collection Time    05/17/12  9:40 AM      Result Value Range   Sodium 138  135 - 145 mEq/L   Potassium 3.4 (*) 3.5 - 5.1 mEq/L   Chloride 103  96 - 112  mEq/L   CO2 24  19 - 32 mEq/L   Glucose, Bld 226 (*) 70 - 99 mg/dL   BUN 10  6 - 23 mg/dL   Creatinine, Ser 1.14 (*) 0.50 - 1.10 mg/dL   Calcium 9.3  8.4 - 10.5 mg/dL   GFR calc non Af Amer 54 (*) >90 mL/min   GFR calc Af Amer 62 (*) >90 mL/min   Comment:            The eGFR has been calculated     using the CKD EPI equation.     This calculation has not been     validated in all clinical     situations.     eGFR's persistently     <90 mL/min signify     possible Chronic Kidney Disease.  GLUCOSE, CAPILLARY     Status: Abnormal   Collection Time    05/17/12 11:58 AM      Result Value Range   Glucose-Capillary 194 (*) 70 - 99 mg/dL   Comment 1 Notify RN      Mr Foot Right W Wo Contrast  05/16/2012  *RADIOLOGY REPORT*  Clinical Data: Ulcer along the dorsal foot.  Query osteomyelitis. Cellulitis.  MRI OF THE RIGHT FOREFOOT WITHOUT AND WITH CONTRAST  Technique:  Multiplanar, multisequence MR imaging was performed both before and after administration of intravenous contrast.  Contrast: 20mL MULTIHANCE GADOBENATE DIMEGLUMINE 529 MG/ML IV SOLN  Comparison: 05/14/2012  Findings: Despite efforts by the patient and technologist, motion artifact  is present on some series of today's examination and could not be totally eliminated.  This reduces diagnostic sensitivity and specificity.  Abnormal lack of enhancement noted in the soft tissues plantar to the second metatarsophalangeal joint, and extending superiorly to the medial side of the shaft of the proximal phalanx of the second toe into the subcutaneous tissues dorsal to the proximal phalanx of the second toe.  Associated skin irregularity noted along the plantar extent of this process on image 11 of series 10, and extending towards the crease between the first and second toes on image 13 of series 10.  No gas is identified within this process.  No marrow edema or enhancement to suggest osteomyelitis.  There is diffuse dorsal subcutaneous edema along  with edema tracking in the distal plantar musculature of the foot and along the plantar - medial side of the foot with associated enhancement as shown on images 8-15 of series 2.  Abnormal enhancement appears to extend into the toes, particularly the second toe.  IMPRESSION:  1.  Cellulitis of the distal foot potentially with distal myositis. No osteomyelitis observed. 2.  Abnormal region of low enhancement tracking from the plantar to the dorsal soft tissues between the first and second digits, but primarily around the second digit at the level of the metatarsophalangeal joint.  Appearance may reflect tissue necrosis or early abscess.  No definite gas observed.   Original Report Authenticated By: Van Clines, M.D.     Review of Systems  All other systems reviewed and are negative.   Blood pressure 145/72, pulse 89, temperature 98.2 F (36.8 C), temperature source Oral, resp. rate 18, height 5\' 4"  (1.626 m), weight 100.245 kg (221 lb), last menstrual period 09/18/2011, SpO2 100.00%. Physical Exam On examination patient has a palpable dorsalis pedis pulse. She is an area outlined which does not show cellulitis but shows a cellulitis a consolidating in her foot. There is blistering over the dorsum of the second toe and a small cut on the plantar aspect of her foot. There is no palpable abscess there is no purulent drainage. She does have a palpable dorsalis pedis pulse. Review of the radiographs and review of the MRI scan shows no focal abscess and shows no osteomyelitis. Assessment/Plan: Assessment: Cellulitis right foot with swelling with diabetic insensate neuropathy.  Plan: Recommend continued the IV vancomycin and Zosyn. I will followup on Monday to evaluate if there is any need for surgical intervention. With the patient's rapid improvement I feel she may  resolve this with just antibiotics. I will reevaluate on Monday  Darean Rote V 05/17/2012, 2:47 PM

## 2012-05-18 LAB — CBC
HCT: 29.2 % — ABNORMAL LOW (ref 36.0–46.0)
Hemoglobin: 9.5 g/dL — ABNORMAL LOW (ref 12.0–15.0)
MCH: 27.5 pg (ref 26.0–34.0)
MCHC: 32.5 g/dL (ref 30.0–36.0)
MCV: 84.6 fL (ref 78.0–100.0)
Platelets: 432 10*3/uL — ABNORMAL HIGH (ref 150–400)
RBC: 3.45 MIL/uL — ABNORMAL LOW (ref 3.87–5.11)
RDW: 14.6 % (ref 11.5–15.5)
WBC: 30.4 10*3/uL — ABNORMAL HIGH (ref 4.0–10.5)

## 2012-05-18 LAB — BASIC METABOLIC PANEL
BUN: 9 mg/dL (ref 6–23)
CO2: 23 mEq/L (ref 19–32)
Calcium: 9 mg/dL (ref 8.4–10.5)
Chloride: 108 mEq/L (ref 96–112)
Creatinine, Ser: 1.2 mg/dL — ABNORMAL HIGH (ref 0.50–1.10)
GFR calc Af Amer: 59 mL/min — ABNORMAL LOW (ref >90)
GFR calc non Af Amer: 51 mL/min — ABNORMAL LOW (ref >90)
Glucose, Bld: 82 mg/dL (ref 70–99)
Potassium: 3.7 mEq/L (ref 3.5–5.1)
Sodium: 141 mEq/L (ref 135–145)

## 2012-05-18 LAB — GLUCOSE, CAPILLARY
Glucose-Capillary: 77 mg/dL (ref 70–99)
Glucose-Capillary: 114 mg/dL — ABNORMAL HIGH (ref 70–99)
Glucose-Capillary: 82 mg/dL (ref 70–99)
Glucose-Capillary: 82 mg/dL (ref 70–99)

## 2012-05-18 LAB — VANCOMYCIN, TROUGH: Vancomycin Tr: 12.1 ug/mL (ref 10.0–20.0)

## 2012-05-18 MED ORDER — CARVEDILOL 25 MG PO TABS
25.0000 mg | ORAL_TABLET | Freq: Two times a day (BID) | ORAL | Status: DC
  Administered 2012-05-18 – 2012-05-20 (×4): 25 mg via ORAL
  Filled 2012-05-18 (×5): qty 1

## 2012-05-18 NOTE — Progress Notes (Signed)
Family Medicine Teaching Service Daily Progress Note Service Page: [phone]  Subjective: doing well, feels like foot continues to do better.  Objective: Temp:  [98 F (36.7 C)-98.7 F (37.1 C)] 98.1 F (36.7 C) (05/10 0531) Pulse Rate:  [88-98] 98 (05/10 0531) Resp:  [18-20] 20 (05/10 0531) BP: (140-159)/(69-71) 159/71 mmHg (05/10 0531) SpO2:  [99 %-100 %] 100 % (05/10 0531) Exam: General: NAD, resting comfortably in bed  Cardiovascular: rrr, no mrg Respiratory: CTAB, no wheezes or crackles appreciated Extremities: RLE erythema improved, foot wrapped, minimal drainage apparent today Neuro: alert, no focal deficits  CBC BMET   Recent Labs Lab 05/16/12 0500 05/17/12 0940 05/18/12 0500  WBC 32.5* 31.0* 30.4*  HGB 9.2* 9.3* 9.5*  HCT 28.1* 29.2* 29.2*  PLT 412* 398 432*    Recent Labs Lab 05/16/12 0500 05/17/12 0940 05/18/12 0500  NA 142 138 141  K 2.8* 3.4* 3.7  CL 105 103 108  CO2 23 24 23   BUN 16 10 9   CREATININE 1.30* 1.14* 1.20*  GLUCOSE 93 226* 82  CALCIUM 8.8 9.3 9.0     Results for orders placed during the hospital encounter of 05/15/12 (from the past 24 hour(s))  GLUCOSE, CAPILLARY     Status: Abnormal   Collection Time    05/17/12 11:58 AM      Result Value Range   Glucose-Capillary 194 (*) 70 - 99 mg/dL   Comment 1 Notify RN    GLUCOSE, CAPILLARY     Status: Abnormal   Collection Time    05/17/12  4:58 PM      Result Value Range   Glucose-Capillary 103 (*) 70 - 99 mg/dL   Comment 1 Notify RN    GLUCOSE, CAPILLARY     Status: None   Collection Time    05/17/12 10:24 PM      Result Value Range   Glucose-Capillary 94  70 - 99 mg/dL  CBC     Status: Abnormal   Collection Time    05/18/12  5:00 AM      Result Value Range   WBC 30.4 (*) 4.0 - 10.5 K/uL   RBC 3.45 (*) 3.87 - 5.11 MIL/uL   Hemoglobin 9.5 (*) 12.0 - 15.0 g/dL   HCT 29.2 (*) 36.0 - 46.0 %   MCV 84.6  78.0 - 100.0 fL   MCH 27.5  26.0 - 34.0 pg   MCHC 32.5  30.0 - 36.0 g/dL   RDW 14.6  11.5 - 15.5 %   Platelets 432 (*) 150 - 400 K/uL  BASIC METABOLIC PANEL     Status: Abnormal   Collection Time    05/18/12  5:00 AM      Result Value Range   Sodium 141  135 - 145 mEq/L   Potassium 3.7  3.5 - 5.1 mEq/L   Chloride 108  96 - 112 mEq/L   CO2 23  19 - 32 mEq/L   Glucose, Bld 82  70 - 99 mg/dL   BUN 9  6 - 23 mg/dL   Creatinine, Ser 1.20 (*) 0.50 - 1.10 mg/dL   Calcium 9.0  8.4 - 10.5 mg/dL   GFR calc non Af Amer 51 (*) >90 mL/min   GFR calc Af Amer 59 (*) >90 mL/min  GLUCOSE, CAPILLARY     Status: None   Collection Time    05/18/12  7:37 AM      Result Value Range   Glucose-Capillary 77  70 - 99 mg/dL  Imaging/Diagnostic Tests: Mr Foot Right W Wo Contrast  05/16/2012  *RADIOLOGY REPORT*  Clinical Data: Ulcer along the dorsal foot.  Query osteomyelitis. Cellulitis.  MRI OF THE RIGHT FOREFOOT WITHOUT AND WITH CONTRAST  Technique:  Multiplanar, multisequence MR imaging was performed both before and after administration of intravenous contrast.  Contrast: 64mL MULTIHANCE GADOBENATE DIMEGLUMINE 529 MG/ML IV SOLN  Comparison: 05/14/2012  Findings: Despite efforts by the patient and technologist, motion artifact is present on some series of today's examination and could not be totally eliminated.  This reduces diagnostic sensitivity and specificity.  Abnormal lack of enhancement noted in the soft tissues plantar to the second metatarsophalangeal joint, and extending superiorly to the medial side of the shaft of the proximal phalanx of the second toe into the subcutaneous tissues dorsal to the proximal phalanx of the second toe.  Associated skin irregularity noted along the plantar extent of this process on image 11 of series 10, and extending towards the crease between the first and second toes on image 13 of series 10.  No gas is identified within this process.  No marrow edema or enhancement to suggest osteomyelitis.  There is diffuse dorsal subcutaneous edema along with  edema tracking in the distal plantar musculature of the foot and along the plantar - medial side of the foot with associated enhancement as shown on images 8-15 of series 2.  Abnormal enhancement appears to extend into the toes, particularly the second toe.  IMPRESSION:  1.  Cellulitis of the distal foot potentially with distal myositis. No osteomyelitis observed. 2.  Abnormal region of low enhancement tracking from the plantar to the dorsal soft tissues between the first and second digits, but primarily around the second digit at the level of the metatarsophalangeal joint.  Appearance may reflect tissue necrosis or early abscess.  No definite gas observed.   Original Report Authenticated By: Van Clines, M.D.    Assessment/Plan: Rachel Chaney is a 52 y.o. year old female with poorly controlled diabetes with severe diabetic foot infection.   1. Diabetic foot infection: improved. Plain film and MRI with no evidence of osteomyelitis. WBC trending down. No systemic signs of sepsis currently.   -continue empiric vanc/zosyn, no wound cultures available to guide therapy-placed call to plastic surgeon on call to see if they can evaluate the patient -follow up blood cultures-NGTD   -wound consult  -MRI revealed possible tissue necrosis vs abscess -appreciate ortho help in this case-continue IV antibiotics, feel it is healing well, will f/u on monday -f/u CBC-WBC trending down slowly  2. DM2. Last A1c 14 this year. CBGs 94-194 -A1c 11.6  -continue daily lantus 85 u  -moderate SSI -she does not use meal time insulin at home. -will need to consider increase in lantus and addition of meal time insulin at home-though has been well controlled on lantus in the hospital, so may be non-compliant   3. HTN. Has had some elevated BPs  -Continue home meds (coreg, enalapril, HCTZ, norvasc)-with increase in coreg to 25 mg BID   4. Peripheral neuropathy.  -continue home gabapentin   5. Hypokalemia:  resolved. -f/u BMET this morning  FEN/GI: SLIV.  Prophylaxis: renal dose lovenox.  Disposition: Discharge pending eval by ortho Monday.  Code: Full code.    Rachel Rumps, MD 05/18/2012, 10:44 AM

## 2012-05-18 NOTE — Progress Notes (Signed)
FMTS Attending Admission Note: Rachel Treu,MD I  have seen and examined this patient, reviewed their chart. I have discussed this patient with the resident. I agree with the resident's findings, assessment and care plan.  Patient improving daily.Afebrile,of concern is her elevated wbc which is now trending down,but looking back as far as 2012 she has had elevated WBC. If level does not come down to normal even with A/B treatment we will consider also assessing for blood disorder.

## 2012-05-18 NOTE — Progress Notes (Signed)
ANTIBIOTIC CONSULT NOTE - FOLLOW UP  Pharmacy Consult for Vancomycin Indication: diabetic foot ulcer  No Known Allergies  Patient Measurements: Height: 5\' 4"  (162.6 cm) Weight: 221 lb (100.245 kg) IBW/kg (Calculated) : 54.7 Adjusted Body Weight:   Vital Signs: Temp: 98.1 F (36.7 C) (05/10 0531) Temp src: Oral (05/10 0531) BP: 159/71 mmHg (05/10 0531) Pulse Rate: 98 (05/10 0531) Intake/Output from previous day: 05/09 0701 - 05/10 0700 In: 838.8 [I.V.:238.8; IV Piggyback:600] Out: -  Intake/Output from this shift:    Labs:  Recent Labs  05/16/12 0500 05/17/12 0940 05/18/12 0500  WBC 32.5* 31.0* 30.4*  HGB 9.2* 9.3* 9.5*  PLT 412* 398 432*  CREATININE 1.30* 1.14* 1.20*   Estimated Creatinine Clearance: 62.4 ml/min (by C-G formula based on Cr of 1.2).  Recent Labs  05/18/12 1135  VANCOTROUGH 12.1     Microbiology: Recent Results (from the past 720 hour(s))  CULTURE, BLOOD (ROUTINE X 2)     Status: None   Collection Time    05/14/12  3:47 PM      Result Value Range Status   Specimen Description BLOOD RIGHT ARM   Final   Special Requests BOTTLES DRAWN AEROBIC AND ANAEROBIC 5CC   Final   Culture  Setup Time 05/14/2012 21:38   Final   Culture     Final   Value:        BLOOD CULTURE RECEIVED NO GROWTH TO DATE CULTURE WILL BE HELD FOR 5 DAYS BEFORE ISSUING A FINAL NEGATIVE REPORT   Report Status PENDING   Incomplete  CULTURE, BLOOD (ROUTINE X 2)     Status: None   Collection Time    05/14/12  4:05 PM      Result Value Range Status   Specimen Description BLOOD LEFT ARM   Final   Special Requests BOTTLES DRAWN AEROBIC ONLY 2CC   Final   Culture  Setup Time 05/14/2012 21:38   Final   Culture     Final   Value:        BLOOD CULTURE RECEIVED NO GROWTH TO DATE CULTURE WILL BE HELD FOR 5 DAYS BEFORE ISSUING A FINAL NEGATIVE REPORT   Report Status PENDING   Incomplete    Anti-infectives   Start     Dose/Rate Route Frequency Ordered Stop   05/15/12 2200   piperacillin-tazobactam (ZOSYN) IVPB 3.375 g     3.375 g 12.5 mL/hr over 240 Minutes Intravenous 3 times per day 05/15/12 1210     05/15/12 1300  vancomycin (VANCOCIN) 2,000 mg in sodium chloride 0.9 % 500 mL IVPB     2,000 mg 250 mL/hr over 120 Minutes Intravenous  Once 05/15/12 1210 05/15/12 1706   05/15/12 1300  vancomycin (VANCOCIN) 1,500 mg in sodium chloride 0.9 % 500 mL IVPB     1,500 mg 250 mL/hr over 120 Minutes Intravenous Every 24 hours 05/15/12 1210     05/15/12 1230  piperacillin-tazobactam (ZOSYN) IVPB 3.375 g     3.375 g 100 mL/hr over 30 Minutes Intravenous NOW 05/15/12 1210 05/15/12 1536      Assessment: 52yo female with diabetic foot ulcer, MRI (-) osteomyelitis.  Vancomycin trough 12.2 on current dose and renal fxn is stable.  WBC have been slowly improving.  Goal of Therapy:  Vancomycin trough level 10-15 mcg/ml  Plan:  1.  Continue current dosing 2.  Watch renal function  Gracy Bruins, PharmD Clinical Pharmacist [facility]

## 2012-05-19 LAB — BASIC METABOLIC PANEL
BUN: 9 mg/dL (ref 6–23)
CO2: 22 mEq/L (ref 19–32)
Calcium: 8.9 mg/dL (ref 8.4–10.5)
Chloride: 106 mEq/L (ref 96–112)
Creatinine, Ser: 1.24 mg/dL — ABNORMAL HIGH (ref 0.50–1.10)
GFR calc Af Amer: 56 mL/min — ABNORMAL LOW (ref >90)
GFR calc non Af Amer: 49 mL/min — ABNORMAL LOW (ref >90)
Glucose, Bld: 94 mg/dL (ref 70–99)
Potassium: 3.5 mEq/L (ref 3.5–5.1)
Sodium: 138 mEq/L (ref 135–145)

## 2012-05-19 LAB — GLUCOSE, CAPILLARY
Glucose-Capillary: 154 mg/dL — ABNORMAL HIGH (ref 70–99)
Glucose-Capillary: 181 mg/dL — ABNORMAL HIGH (ref 70–99)
Glucose-Capillary: 74 mg/dL (ref 70–99)
Glucose-Capillary: 102 mg/dL — ABNORMAL HIGH (ref 70–99)

## 2012-05-19 LAB — CBC
HCT: 27.9 % — ABNORMAL LOW (ref 36.0–46.0)
Hemoglobin: 9.2 g/dL — ABNORMAL LOW (ref 12.0–15.0)
MCH: 28 pg (ref 26.0–34.0)
MCHC: 33 g/dL (ref 30.0–36.0)
MCV: 84.8 fL (ref 78.0–100.0)
Platelets: 386 10*3/uL (ref 150–400)
RBC: 3.29 MIL/uL — ABNORMAL LOW (ref 3.87–5.11)
RDW: 14.8 % (ref 11.5–15.5)
WBC: 26.3 10*3/uL — ABNORMAL HIGH (ref 4.0–10.5)

## 2012-05-19 NOTE — Progress Notes (Signed)
Family Medicine Teaching Service Daily Progress Note Service Page: [phone]  Subjective: NAEO, no complaints today. Aware of need for continued IV ABX. Spent considerable time discussing blood flow, infections, proper foot care in DM.   Objective: Temp:  [98 F (36.7 C)-99.1 F (37.3 C)] 98 F (36.7 C) (05/11 0615) Pulse Rate:  [85-93] 85 (05/11 0615) Resp:  [18] 18 (05/11 0615) BP: (128-153)/(69-78) 153/75 mmHg (05/11 0615) SpO2:  [97 %-99 %] 99 % (05/11 0615) Exam: General: NAD, resting comfortably in bed  Cardiovascular: rrr, no mrg Respiratory: CTAB, no wheezes or crackles appreciated Extremities: RLE erythema improved, foot wrapped, dressings C/D/I Neuro: alert, no focal deficits  CBC BMET   Recent Labs Lab 05/17/12 0940 05/18/12 0500 05/19/12 0555  WBC 31.0* 30.4* 26.3*  HGB 9.3* 9.5* 9.2*  HCT 29.2* 29.2* 27.9*  PLT 398 432* 386    Recent Labs Lab 05/17/12 0940 05/18/12 0500 05/19/12 0555  NA 138 141 138  K 3.4* 3.7 3.5  CL 103 108 106  CO2 24 23 22   BUN 10 9 9   CREATININE 1.14* 1.20* 1.24*  GLUCOSE 226* 82 94  CALCIUM 9.3 9.0 8.9     Results for orders placed during the hospital encounter of 05/15/12 (from the past 24 hour(s))  GLUCOSE, CAPILLARY     Status: Abnormal   Collection Time    05/18/12 11:44 AM      Result Value Range   Glucose-Capillary 114 (*) 70 - 99 mg/dL  GLUCOSE, CAPILLARY     Status: None   Collection Time    05/18/12  5:42 PM      Result Value Range   Glucose-Capillary 82  70 - 99 mg/dL  GLUCOSE, CAPILLARY     Status: None   Collection Time    05/18/12  9:28 PM      Result Value Range   Glucose-Capillary 82  70 - 99 mg/dL  CBC     Status: Abnormal   Collection Time    05/19/12  5:55 AM      Result Value Range   WBC 26.3 (*) 4.0 - 10.5 K/uL   RBC 3.29 (*) 3.87 - 5.11 MIL/uL   Hemoglobin 9.2 (*) 12.0 - 15.0 g/dL   HCT 27.9 (*) 36.0 - 46.0 %   MCV 84.8  78.0 - 100.0 fL   MCH 28.0  26.0 - 34.0 pg   MCHC 33.0  30.0 -  36.0 g/dL   RDW 14.8  11.5 - 15.5 %   Platelets 386  150 - 400 K/uL  BASIC METABOLIC PANEL     Status: Abnormal   Collection Time    05/19/12  5:55 AM      Result Value Range   Sodium 138  135 - 145 mEq/L   Potassium 3.5  3.5 - 5.1 mEq/L   Chloride 106  96 - 112 mEq/L   CO2 22  19 - 32 mEq/L   Glucose, Bld 94  70 - 99 mg/dL   BUN 9  6 - 23 mg/dL   Creatinine, Ser 1.24 (*) 0.50 - 1.10 mg/dL   Calcium 8.9  8.4 - 10.5 mg/dL   GFR calc non Af Amer 49 (*) >90 mL/min   GFR calc Af Amer 56 (*) >90 mL/min  GLUCOSE, CAPILLARY     Status: Abnormal   Collection Time    05/19/12  7:53 AM      Result Value Range   Glucose-Capillary 154 (*) 70 - 99 mg/dL  Imaging/Diagnostic Tests: No results found. Assessment/Plan: Makeba Millman is a 52 y.o. year old female with poorly controlled diabetes with severe diabetic foot infection.   1. Diabetic foot infection: improved. Plain film and MRI with no evidence of osteomyelitis. WBC trending down. No systemic signs of sepsis currently. MRI revealed possible tissue necrosis vs abscess. WBC trending down and Pt AFVSS -continue empiric vanc/zosyn, no wound cultures available to guide therapy-placed call to plastic surgeon on call to see if they can evaluate the patient -follow up blood cultures-NGTD   -wound consult  -appreciate ortho help in this case-continue IV antibiotics, feel it is healing well, will f/u on monday -f/u CBC-WBC trending down slowly  2. DM2. Last A1c 14 this year. CBGs 94-194 -A1c 11.6  -continue daily lantus 85 u  -moderate SSI -she does not use meal time insulin at home. -will need to consider increase in lantus and addition of meal time insulin at home-though has been well controlled on lantus in the hospital, so may be non-compliant   3. HTN. Has had some elevated BPs  -Continue home meds (coreg, enalapril, HCTZ, norvasc)-with increase in coreg to 25 mg BID   4. Peripheral neuropathy.  -continue home gabapentin   5.  Hypokalemia: resolved. -f/u BMET this morning  6. Anemia: likely of chronic disease. 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 Pt will need renal referral once solid stage III. Most benefit from DM control and HTN control.  -continue ACEi for renal protection - trend BMET prn  FEN/GI: SLIV.  Prophylaxis: renal dose lovenox.  Disposition: Discharge pending eval by ortho Monday.  Code: Full code.    Linna Darner, MD Family Medicine PGY-2 05/19/2012, 11:35 AM

## 2012-05-19 NOTE — Progress Notes (Signed)
FMTS Attending Admission Note: Rachel Launer,MD I  have seen and examined this patient, reviewed their chart. I have discussed this patient with the resident. I agree with the resident's findings, assessment and care plan.  Patient continues to improve,her right LL and foot erythema improved a lot,no pus on her plantar ulcer and the wound is healing well,continue A/B IV for another day before transposition to oral med.Continue to work on DM control.COntinue wet to dry wound dressing,I personally changed her dressing today.

## 2012-05-20 LAB — BASIC METABOLIC PANEL
BUN: 11 mg/dL (ref 6–23)
CO2: 23 mEq/L (ref 19–32)
Calcium: 9 mg/dL (ref 8.4–10.5)
Chloride: 108 mEq/L (ref 96–112)
Creatinine, Ser: 1.27 mg/dL — ABNORMAL HIGH (ref 0.50–1.10)
GFR calc Af Amer: 55 mL/min — ABNORMAL LOW (ref >90)
GFR calc non Af Amer: 47 mL/min — ABNORMAL LOW (ref >90)
Glucose, Bld: 91 mg/dL (ref 70–99)
Potassium: 3.4 mEq/L — ABNORMAL LOW (ref 3.5–5.1)
Sodium: 140 mEq/L (ref 135–145)

## 2012-05-20 LAB — CULTURE, BLOOD (ROUTINE X 2)
Culture: NO GROWTH
Culture: NO GROWTH

## 2012-05-20 LAB — CBC
HCT: 29.6 % — ABNORMAL LOW (ref 36.0–46.0)
Hemoglobin: 9.4 g/dL — ABNORMAL LOW (ref 12.0–15.0)
MCH: 26.9 pg (ref 26.0–34.0)
MCHC: 31.8 g/dL (ref 30.0–36.0)
MCV: 84.6 fL (ref 78.0–100.0)
Platelets: 392 10*3/uL (ref 150–400)
RBC: 3.5 MIL/uL — ABNORMAL LOW (ref 3.87–5.11)
RDW: 14.8 % (ref 11.5–15.5)
WBC: 23.5 10*3/uL — ABNORMAL HIGH (ref 4.0–10.5)

## 2012-05-20 LAB — GLUCOSE, CAPILLARY: Glucose-Capillary: 139 mg/dL — ABNORMAL HIGH (ref 70–99)

## 2012-05-20 MED ORDER — DOXYCYCLINE HYCLATE 50 MG PO CAPS: 50.0000 mg | ORAL_CAPSULE | Freq: Two times a day (BID) | ORAL | Status: DC

## 2012-05-20 MED ORDER — CARVEDILOL 25 MG PO TABS: 25.0000 mg | ORAL_TABLET | Freq: Two times a day (BID) | ORAL | Status: DC

## 2012-05-20 NOTE — Progress Notes (Signed)
FMTS Attending Daily Note:  Annabell Sabal MD  [phone] pager  Family Practice pager:  [phone] I have discussed this patient with the resident Dr. Caryl Bis.  I agree with their findings, assessment, and care plan.  Patient was DCed back to SNF before I could examine her today.

## 2012-05-20 NOTE — Progress Notes (Signed)
Patient ID: Rachel Chaney, female   DOB: Jun 22, 1960, 52 y.o.   MRN: [id number] Patient's right foot continues to look better. There is no palpable sign of abscess. She does have a superficial blister dorsally over her foot. She has a strong dorsalis pedis pulse and there are no ischemic changes to the toes. The swelling and cellulitis in the leg has completely resolved. I feel patient would be safe to be discharged on by mouth doxycycline and I will followup in the office in one week.

## 2012-05-20 NOTE — Discharge Summary (Signed)
Physician Discharge Summary  Patient ID: Rachel Chaney MRN: [id number] DOB: 29-Jan-1960 Age: 52 y.o.  Admit date: 05/15/2012 Discharge date: 05/20/2012 Admitting Physician: Andrena Mews, MD  PCP: Linna Darner, MD  Consultants: ortho Dr. Sharol Given     Discharge Diagnosis: Principal Problem:   Diabetic infection of right foot Active Problems:   Type II or unspecified type diabetes mellitus without mention of complication, uncontrolled   HYPERTENSION, BENIGN SYSTEMIC   Numbness of feet    Hospital Course Rachel Chaney is a 52 y.o. year old female with poorly controlled diabetes with severe diabetic foot infection.   1. Diabetic foot infection: patient presented as a direct admission from clinic for right foot cellulitis in setting of DM. She had pain, swelling, and erythema initially and was noted to have a 1.5 cm ulceration on the dorsum of the foot, with inability to probe to bone. Additionally noted to have 3 cm superficial blister between 2nd and 3rd digits dorsally. WBC was noted to be 43 prior to admission. Patient was started on vanc and zosyn IV. Plain film revealed no signs of osteomyelitis. MRI revealed no osteomyelitis, though question of tissue necrosis or early abscess. Ortho was consulted and recommended continuing vanc and zosyn. Upon reevaluation the patient was felt to be improved and patient was felt to be ok for discharge on PO doxycycline with follow-up with Dr. Sharol Given in one week. Patients foot was improved at time of discharge and WBC trended down.  2. DM2. Last A1c 14 this year. A1c 11.6. Continued daily lantus 85 u. Also on moderate SSI. Added novolog 7 u TID with meals daily. CBGs were well controlled in the hospital, mostly in the 80-180 range.  3. HTN. With some elevations to the 150's/90's. Continued on home enalapril, HCTZ, and norvasc. Home coreg increased to 25 mg BID.    4. Peripheral neuropathy. Continued home gabapentin.  5. Hypokalemia: to 2.8. Repleted  with PO potassium. Stable at time of discharge.    6. Anemia: likely of chronic disease. Asymptomatic. Stable at 9.4 at time of discharge.   7. 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 Stable throughout hospitalization. Continued on home ACEi.  Problem List 1. Cellulitis 2. DM with peripheral neuropathy 3. HTN 4. Phypokalemia 5. Anemia 6. CKD   Procedures/Imaging:  Mr Foot Right W Wo Contrast  05/16/2012  *RADIOLOGY REPORT*  Clinical Data: Ulcer along the dorsal foot.  Query osteomyelitis. Cellulitis.  MRI OF THE RIGHT FOREFOOT WITHOUT AND WITH CONTRAST  Technique:  Multiplanar, multisequence MR imaging was performed both before and after administration of intravenous contrast.  Contrast: 43mL MULTIHANCE GADOBENATE DIMEGLUMINE 529 MG/ML IV SOLN  Comparison: 05/14/2012  Findings: Despite efforts by the patient and technologist, motion artifact is present on some series of today's examination and could not be totally eliminated.  This reduces diagnostic sensitivity and specificity.  Abnormal lack of enhancement noted in the soft tissues plantar to the second metatarsophalangeal joint, and extending superiorly to the medial side of the shaft of the proximal phalanx of the second toe into the subcutaneous tissues dorsal to the proximal phalanx of the second toe.  Associated skin irregularity noted along the plantar extent of this process on image 11 of series 10, and extending towards the crease between the first and second toes on image 13 of series 10.  No gas is identified within this process.  No marrow edema or enhancement to suggest osteomyelitis.  There is diffuse dorsal subcutaneous edema along with edema tracking in the distal plantar musculature of  the foot and along the plantar - medial side of the foot with associated enhancement as shown on images 8-15 of series 2.  Abnormal enhancement appears to extend into the toes, particularly the second toe.  IMPRESSION:  1.  Cellulitis of the distal foot potentially with  distal myositis. No osteomyelitis observed. 2.  Abnormal region of low enhancement tracking from the plantar to the dorsal soft tissues between the first and second digits, but primarily around the second digit at the level of the metatarsophalangeal joint.  Appearance may reflect tissue necrosis or early abscess.  No definite gas observed.   Original Report Authenticated By: Van Clines, M.D.    Dg Foot Complete Right  05/14/2012  *RADIOLOGY REPORT*  Clinical Data: Right foot swelling and pain.  Wound between great and second toes.  RIGHT FOOT COMPLETE - 3+ VIEW  Comparison: None  Findings: There is no evidence of fracture, subluxation or dislocation. No radiographic evidence of osteomyelitis is identified. The Lisfranc joints are intact. Diffuse soft tissue swelling is present. A small to moderate calcaneal spur is present. No other focal bony abnormalities are noted.  IMPRESSION: Soft tissue swelling without acute bony abnormality.   Original Report Authenticated By: Margarette Canada, M.D.     Labs  CBC  Recent Labs Lab 05/18/12 0500 05/19/12 0555 05/20/12 0618  WBC 30.4* 26.3* 23.5*  HGB 9.5* 9.2* 9.4*  HCT 29.2* 27.9* 29.6*  PLT 432* 386 392   BMET  Recent Labs Lab 05/14/12 1359 05/15/12 1134  05/18/12 0500 05/19/12 0555 05/20/12 0618  NA 138 137  < > 141 138 140  K 3.5 2.8*  < > 3.7 3.5 3.4*  CL 99 100  < > 108 106 108  CO2 25 25  < > 23 22 23   BUN 20 20  < > 9 9 11   CREATININE 1.41* 1.39*  < > 1.20* 1.24* 1.27*  CALCIUM 9.1 9.6  < > 9.0 8.9 9.0  PROT 7.0 7.9  --   --   --   --   BILITOT 0.5 0.2*  --   --   --   --   ALKPHOS 174* 162*  --   --   --   --   ALT 12 10  --   --   --   --   AST 15 12  --   --   --   --   GLUCOSE 235* 201*  < > 82 94 91  < > = values in this interval not displayed. Results for orders placed during the hospital encounter of 05/15/12 (from the past 72 hour(s))  GLUCOSE, CAPILLARY     Status: Abnormal   Collection Time    05/17/12  4:58 PM       Result Value Range   Glucose-Capillary 103 (*) 70 - 99 mg/dL   Comment 1 Notify RN    GLUCOSE, CAPILLARY     Status: None   Collection Time    05/17/12 10:24 PM      Result Value Range   Glucose-Capillary 94  70 - 99 mg/dL  CBC     Status: Abnormal   Collection Time    05/18/12  5:00 AM      Result Value Range   WBC 30.4 (*) 4.0 - 10.5 K/uL   RBC 3.45 (*) 3.87 - 5.11 MIL/uL   Hemoglobin 9.5 (*) 12.0 - 15.0 g/dL   HCT 29.2 (*) 36.0 - 46.0 %   MCV 84.6  78.0 - 100.0 fL   MCH 27.5  26.0 - 34.0 pg   MCHC 32.5  30.0 - 36.0 g/dL   RDW 14.6  11.5 - 15.5 %   Platelets 432 (*) 150 - 400 K/uL  BASIC METABOLIC PANEL     Status: Abnormal   Collection Time    05/18/12  5:00 AM      Result Value Range   Sodium 141  135 - 145 mEq/L   Potassium 3.7  3.5 - 5.1 mEq/L   Chloride 108  96 - 112 mEq/L   CO2 23  19 - 32 mEq/L   Glucose, Bld 82  70 - 99 mg/dL   BUN 9  6 - 23 mg/dL   Creatinine, Ser 1.20 (*) 0.50 - 1.10 mg/dL   Calcium 9.0  8.4 - 10.5 mg/dL   GFR calc non Af Amer 51 (*) >90 mL/min   GFR calc Af Amer 59 (*) >90 mL/min   Comment:            The eGFR has been calculated     using the CKD EPI equation.     This calculation has not been     validated in all clinical     situations.     eGFR's persistently     <90 mL/min signify     possible Chronic Kidney Disease.  GLUCOSE, CAPILLARY     Status: None   Collection Time    05/18/12  7:37 AM      Result Value Range   Glucose-Capillary 77  70 - 99 mg/dL  VANCOMYCIN, TROUGH     Status: None   Collection Time    05/18/12 11:35 AM      Result Value Range   Vancomycin Tr 12.1  10.0 - 20.0 ug/mL  GLUCOSE, CAPILLARY     Status: Abnormal   Collection Time    05/18/12 11:44 AM      Result Value Range   Glucose-Capillary 114 (*) 70 - 99 mg/dL  GLUCOSE, CAPILLARY     Status: None   Collection Time    05/18/12  5:42 PM      Result Value Range   Glucose-Capillary 82  70 - 99 mg/dL  GLUCOSE, CAPILLARY     Status: None    Collection Time    05/18/12  9:28 PM      Result Value Range   Glucose-Capillary 82  70 - 99 mg/dL  CBC     Status: Abnormal   Collection Time    05/19/12  5:55 AM      Result Value Range   WBC 26.3 (*) 4.0 - 10.5 K/uL   RBC 3.29 (*) 3.87 - 5.11 MIL/uL   Hemoglobin 9.2 (*) 12.0 - 15.0 g/dL   HCT 27.9 (*) 36.0 - 46.0 %   MCV 84.8  78.0 - 100.0 fL   MCH 28.0  26.0 - 34.0 pg   MCHC 33.0  30.0 - 36.0 g/dL   RDW 14.8  11.5 - 15.5 %   Platelets 386  150 - 400 K/uL  BASIC METABOLIC PANEL     Status: Abnormal   Collection Time    05/19/12  5:55 AM      Result Value Range   Sodium 138  135 - 145 mEq/L   Potassium 3.5  3.5 - 5.1 mEq/L   Chloride 106  96 - 112 mEq/L   CO2 22  19 - 32 mEq/L   Glucose, Bld 94  70 -  99 mg/dL   BUN 9  6 - 23 mg/dL   Creatinine, Ser 1.24 (*) 0.50 - 1.10 mg/dL   Calcium 8.9  8.4 - 10.5 mg/dL   GFR calc non Af Amer 49 (*) >90 mL/min   GFR calc Af Amer 56 (*) >90 mL/min   Comment:            The eGFR has been calculated     using the CKD EPI equation.     This calculation has not been     validated in all clinical     situations.     eGFR's persistently     <90 mL/min signify     possible Chronic Kidney Disease.  GLUCOSE, CAPILLARY     Status: Abnormal   Collection Time    05/19/12  7:53 AM      Result Value Range   Glucose-Capillary 154 (*) 70 - 99 mg/dL  GLUCOSE, CAPILLARY     Status: Abnormal   Collection Time    05/19/12 12:07 PM      Result Value Range   Glucose-Capillary 181 (*) 70 - 99 mg/dL  GLUCOSE, CAPILLARY     Status: None   Collection Time    05/19/12  5:14 PM      Result Value Range   Glucose-Capillary 74  70 - 99 mg/dL  GLUCOSE, CAPILLARY     Status: Abnormal   Collection Time    05/19/12  8:59 PM      Result Value Range   Glucose-Capillary 102 (*) 70 - 99 mg/dL  CBC     Status: Abnormal   Collection Time    05/20/12  6:18 AM      Result Value Range   WBC 23.5 (*) 4.0 - 10.5 K/uL   RBC 3.50 (*) 3.87 - 5.11 MIL/uL    Hemoglobin 9.4 (*) 12.0 - 15.0 g/dL   HCT 29.6 (*) 36.0 - 46.0 %   MCV 84.6  78.0 - 100.0 fL   MCH 26.9  26.0 - 34.0 pg   MCHC 31.8  30.0 - 36.0 g/dL   RDW 14.8  11.5 - 15.5 %   Platelets 392  150 - 400 K/uL  BASIC METABOLIC PANEL     Status: Abnormal   Collection Time    05/20/12  6:18 AM      Result Value Range   Sodium 140  135 - 145 mEq/L   Potassium 3.4 (*) 3.5 - 5.1 mEq/L   Chloride 108  96 - 112 mEq/L   CO2 23  19 - 32 mEq/L   Glucose, Bld 91  70 - 99 mg/dL   BUN 11  6 - 23 mg/dL   Creatinine, Ser 1.27 (*) 0.50 - 1.10 mg/dL   Calcium 9.0  8.4 - 10.5 mg/dL   GFR calc non Af Amer 47 (*) >90 mL/min   GFR calc Af Amer 55 (*) >90 mL/min   Comment:            The eGFR has been calculated     using the CKD EPI equation.     This calculation has not been     validated in all clinical     situations.     eGFR's persistently     <90 mL/min signify     possible Chronic Kidney Disease.  GLUCOSE, CAPILLARY     Status: Abnormal   Collection Time    05/20/12  7:49 AM      Result Value  Range   Glucose-Capillary 139 (*) 70 - 99 mg/dL   Comment 1 Notify RN         Patient condition at time of discharge/disposition: stable  Disposition-home   Follow up issues: 1. F/u continued improvement in cellulitis-also ensure patient follow's up with Dr. Sharol Given 2. Follow anemia-was stable at discharge, likely related to renal disease 3. Will likely benefit from getting plugged in with renal given patient is approaching stage 3   Discharge follow up:  Follow-up Information   Follow up with DUDA,MARCUS V, MD In 1 week. (Call to make an appointment)    Contact information:   [address] [place] [address] [phone]       Follow up with [name], MD On 05/30/2012. (1:30 pm)    Contact information:   [place]  [phone]         Discharge Instructions: Please refer to Patient Instructions section of EMR for full details.   Patient was counseled important signs and symptoms that should prompt return to medical care, changes in medications, dietary instructions, activity restrictions, and follow up appointments.    Discharge Medications   Medication List    STOP taking these medications       insulin glargine 100 UNIT/ML injection  Commonly known as:  LANTUS      TAKE these medications       acetaminophen 500 MG tablet  Commonly known as:  TYLENOL  Take 1,000 mg by mouth every 6 (six) hours as needed for pain.     amLODipine 10 MG tablet  Commonly known as:  NORVASC  Take 10 mg by mouth daily.     aspirin EC 81 MG tablet  Take 81 mg by mouth daily.     carvedilol 25 MG tablet  Commonly known as:  COREG  Take 1 tablet (25 mg total) by mouth 2 (two) times daily with a meal.     cetirizine 10 MG tablet  Commonly known as:  ZYRTEC  Take 10 mg by mouth daily as needed for allergies.     doxycycline 50 MG capsule  Commonly known as:  VIBRAMYCIN  Take 1 capsule (50 mg total) by mouth 2 (two) times daily.     enalapril 20 MG tablet  Commonly known as:  VASOTEC  Take 40 mg by mouth daily.     gabapentin 300 MG capsule  Commonly known as:  NEURONTIN  Take 300 mg by mouth 2 (two) times daily as needed (pain).     hydrochlorothiazide 25 MG tablet  Commonly known as:  HYDRODIURIL  Take 1 tablet (25 mg total) by mouth daily.     ibuprofen 200 MG tablet  Commonly known as:  ADVIL,MOTRIN  Take 400 mg by mouth every 6 (six) hours as needed for pain.     insulin aspart 100 UNIT/ML injection  Commonly known as:  NOVOLOG FLEXPEN  Inject 7 Units into the skin 3 (three) times daily before meals.     Insulin Pen Needle 29G X 12MM Misc  Inject 1 pen into the skin 2 (two) times daily. Check blood sugar daily.     omeprazole 20 MG capsule  Commonly known as:  PRILOSEC  Take 20 mg by mouth daily as needed (acid reflux).     ONE TOUCH ULTRA TEST test strip  Generic drug:  glucose blood  Use as  instructed       [name], MD of [facility] 05/22/2012 6:50 PM

## 2012-05-20 NOTE — Progress Notes (Signed)
Family Medicine Teaching Service Daily Progress Note Service Page: [phone]  Subjective: no complaints today. Pt spoke to Dr. Sharol Given this morning and he stated she could go home.  Objective: Temp:  [98.4 F (36.9 C)-98.8 F (37.1 C)] 98.8 F (37.1 C) (05/12 0504) Pulse Rate:  [83-90] 90 (05/12 0504) Resp:  [18] 18 (05/12 0504) BP: (138-147)/(67-72) 147/72 mmHg (05/12 0504) SpO2:  [97 %-99 %] 99 % (05/12 0504) Exam: General: NAD, resting comfortably in bed  Cardiovascular: rrr, no mrg Respiratory: CTAB, no wheezes or crackles appreciated Extremities: RLE erythema improved, foot wrapped, dressings C/D/I Neuro: alert, no focal deficits  CBC BMET   Recent Labs Lab 05/18/12 0500 05/19/12 0555 05/20/12 0618  WBC 30.4* 26.3* 23.5*  HGB 9.5* 9.2* 9.4*  HCT 29.2* 27.9* 29.6*  PLT 432* 386 392    Recent Labs Lab 05/18/12 0500 05/19/12 0555 05/20/12 0618  NA 141 138 140  K 3.7 3.5 3.4*  CL 108 106 108  CO2 23 22 23   BUN 9 9 11   CREATININE 1.20* 1.24* 1.27*  GLUCOSE 82 94 91  CALCIUM 9.0 8.9 9.0     Results for orders placed during the hospital encounter of 05/15/12 (from the past 24 hour(s))  GLUCOSE, CAPILLARY     Status: Abnormal   Collection Time    05/19/12 12:07 PM      Result Value Range   Glucose-Capillary 181 (*) 70 - 99 mg/dL  GLUCOSE, CAPILLARY     Status: None   Collection Time    05/19/12  5:14 PM      Result Value Range   Glucose-Capillary 74  70 - 99 mg/dL  GLUCOSE, CAPILLARY     Status: Abnormal   Collection Time    05/19/12  8:59 PM      Result Value Range   Glucose-Capillary 102 (*) 70 - 99 mg/dL  CBC     Status: Abnormal   Collection Time    05/20/12  6:18 AM      Result Value Range   WBC 23.5 (*) 4.0 - 10.5 K/uL   RBC 3.50 (*) 3.87 - 5.11 MIL/uL   Hemoglobin 9.4 (*) 12.0 - 15.0 g/dL   HCT 29.6 (*) 36.0 - 46.0 %   MCV 84.6  78.0 - 100.0 fL   MCH 26.9  26.0 - 34.0 pg   MCHC 31.8  30.0 - 36.0 g/dL   RDW 14.8  11.5 - 15.5 %   Platelets  392  150 - 400 K/uL  BASIC METABOLIC PANEL     Status: Abnormal   Collection Time    05/20/12  6:18 AM      Result Value Range   Sodium 140  135 - 145 mEq/L   Potassium 3.4 (*) 3.5 - 5.1 mEq/L   Chloride 108  96 - 112 mEq/L   CO2 23  19 - 32 mEq/L   Glucose, Bld 91  70 - 99 mg/dL   BUN 11  6 - 23 mg/dL   Creatinine, Ser 1.27 (*) 0.50 - 1.10 mg/dL   Calcium 9.0  8.4 - 10.5 mg/dL   GFR calc non Af Amer 47 (*) >90 mL/min   GFR calc Af Amer 55 (*) >90 mL/min  GLUCOSE, CAPILLARY     Status: Abnormal   Collection Time    05/20/12  7:49 AM      Result Value Range   Glucose-Capillary 139 (*) 70 - 99 mg/dL   Comment 1 Notify RN  Assessment/Plan: Rachel Chaney is a 52 y.o. year old female with poorly controlled diabetes with severe diabetic foot infection.   1. Diabetic foot infection: improved. Plain film and MRI with no evidence of osteomyelitis. WBC trending down. No systemic signs of sepsis currently. MRI revealed possible tissue necrosis vs abscess. -will transition to PO doxy today from IV vaqnc and zosyn -follow up blood cultures-NGTD   -wound consult  -appreciate ortho help in this case-patient ok to discharge on PO doxy and f/u in office in one week -f/u CBC-WBC trending down slowly  2. DM2. Last A1c 14 this year. CBGs 94-194 -A1c 11.6  -continue daily lantus 85 u  -moderate SSI-she does not use meal time insulin at home. -will need to consider increase in lantus and addition of meal time insulin at home-though has been well controlled on lantus in the hospital, so may be non-compliant   3. HTN. Stable.  -Continue home meds (coreg, enalapril, HCTZ, norvasc)-increased coreg to 25 mg BID   4. Peripheral neuropathy.  -continue home gabapentin   5. Hypokalemia: resolved. -f/u BMET this morning  6. Anemia: likely of chronic disease. Asymptomatic. Stable. - trend CBC PRN  7. 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 Pt will need renal referral once solid stage III. Most benefit from DM control  and HTN control.  - continue ACEi for renal protection - trend BMET prn  FEN/GI: SLIV.  Prophylaxis: renal dose lovenox.  Disposition: Discharge today.  Code: Full code.    [name], MD  05/20/2012, 8:37 AM

## 2012-05-20 NOTE — Consult Note (Signed)
WOC follow-up: Pt now followed by Dr Sharol Given of ortho service for assessment and plan of care. Please re-consult if further assistance is needed.  Thank-you,  Julien Girt MSN, [place], [place], [place], [place]

## 2012-05-20 NOTE — Progress Notes (Signed)
Pt d/ced home with family member, will f/u outpatient with dr. Sharol Given x1 wk

## 2012-05-21 ENCOUNTER — Other Ambulatory Visit: Payer: Self-pay | Admitting: Family Medicine

## 2012-05-21 ENCOUNTER — Telehealth: Payer: Self-pay | Admitting: Family Medicine

## 2012-05-21 LAB — GLUCOSE, CAPILLARY: Glucose-Capillary: 134 mg/dL — ABNORMAL HIGH (ref 70–99)

## 2012-05-21 MED ORDER — INSULIN GLARGINE 100 UNIT/ML SOLOSTAR PEN: 85.0000 [IU] | PEN_INJECTOR | Freq: Every day | SUBCUTANEOUS | Status: DC

## 2012-05-21 NOTE — Telephone Encounter (Signed)
Pt called and informed that Dr. Marily Memos would be calling in Solastar Pen and needles to [facility] at [address]. Tildon Husky, RN-BSN

## 2012-05-21 NOTE — Telephone Encounter (Signed)
Pharmacy sent the refill request for the wrong medication.  The patient needs the refill on the Solastar Lantus Pen that has 5 in the cartridge.  She needs this today because she has none left.  This needs to go to [place].  Patient is asking if we have any sample of this here at our office.

## 2012-05-21 NOTE — Telephone Encounter (Signed)
Pt called again about Lantus insulin. Dr. Marily Memos paged through [facility] -requesting sample for pt since she is out of meds. Awaiting reply. Tildon Husky, RN-BSN

## 2012-05-21 NOTE — Telephone Encounter (Signed)
Pt reports that she is completely out of meds and wrong script was called into pharmacy. Needs Solastar Lantus Pen with 5 in cartridge ( ([facility] [address]) - would like sample if available - Please advise. Tildon Husky, RN-BSN

## 2012-05-21 NOTE — Telephone Encounter (Signed)
Pt has been out and needs today -

## 2012-05-22 ENCOUNTER — Telehealth: Payer: Self-pay | Admitting: *Deleted

## 2012-05-22 NOTE — Telephone Encounter (Signed)
-  prescription for solostar pen  Entered as print not eprescribed - called script into pharmacy for pt. No further action needed. Tildon Husky, RN-BSN

## 2012-05-23 NOTE — Discharge Summary (Signed)
Family Medicine Teaching Service  Discharge Note : Attending Jeff Camauri Fleece MD Pager [phone] Inpatient Team Pager:  [phone]  I have reviewed this patient and the patient's chart and have discussed discharge planning with the resident at the time of discharge. I agree with the discharge plan as above.    

## 2012-05-28 ENCOUNTER — Telehealth: Payer: Self-pay | Admitting: Family Medicine

## 2012-05-28 MED ORDER — ONETOUCH DELICA LANCETS 33G MISC: 1.0000 | Status: DC | PRN

## 2012-05-28 NOTE — Telephone Encounter (Signed)
Pt needs lancets to prick finger for blood testing

## 2012-05-28 NOTE — Telephone Encounter (Signed)
-----   Message from Waldemar Dickens, MD sent at 05/28/2012 2:36 PM ----- [facility] did not have the specific ones she wanted, so I ordered Delica one touch [id number]. (doesn't get much more ultrafine than 33g). Wrote in Rx instructions that product selection allowed.    Pt informed.  Leeandra Ellerson, [place]ome Spotted

## 2012-05-28 NOTE — Telephone Encounter (Signed)
Spoke with patient.  She needs a new prescription sent in to [facility] for her diabetic lancets.  This lancet has never been [facility] this [facility] before for patient.  Lancet is :  Delicate One Touch Ultra stick pin (per patient).  Instructed patient I will request Dr. Marily Memos to send in new prescription.  Pt. Verbalized understanding.  Lashawndra Lampkins L

## 2012-05-30 ENCOUNTER — Inpatient Hospital Stay: Payer: 59 | Admitting: Family Medicine

## 2012-06-04 ENCOUNTER — Telehealth: Payer: Self-pay | Admitting: Family Medicine

## 2012-06-04 NOTE — Telephone Encounter (Signed)
Pt reports skin from blister is loose. Encouraged pt to cover area and continue treating as she has - do not cut due to introducing infection or pull off due to injuring healthy skin. Pt verbalized understanding. NO further concerns. Tildon Husky, RN-BSN

## 2012-06-04 NOTE — Telephone Encounter (Signed)
Skin from blister is hanging. Does she cut it off or just let it come off by itself? Please advise

## 2012-06-27 ENCOUNTER — Other Ambulatory Visit: Payer: Self-pay | Admitting: *Deleted

## 2012-06-27 MED ORDER — INSULIN GLARGINE 100 UNIT/ML SOLOSTAR PEN: 85.0000 [IU] | PEN_INJECTOR | Freq: Every day | SUBCUTANEOUS | Status: DC

## 2012-07-02 ENCOUNTER — Telehealth: Payer: Self-pay | Admitting: Family Medicine

## 2012-07-02 NOTE — Telephone Encounter (Signed)
Will fwd to Md.  Delio Slates, [place], [place]

## 2012-07-02 NOTE — Telephone Encounter (Signed)
Pt got her solostar pen but she wants the pump because it is free.  She wants it so she can get either one. Please advise

## 2012-07-03 NOTE — Telephone Encounter (Signed)
Will call pt today to clarify

## 2012-07-08 NOTE — Telephone Encounter (Signed)
Spoke to pt and pharmacy Vials and syringes ordered for home use and solostar pens for while pt on vacation as they are more expensive  07/03/12  Linna Darner, MD Family Medicine PGY-2 07/08/2012, 1:32 PM

## 2012-07-18 ENCOUNTER — Other Ambulatory Visit: Payer: Self-pay | Admitting: Family Medicine

## 2012-07-18 ENCOUNTER — Other Ambulatory Visit: Payer: Self-pay

## 2012-07-18 MED ORDER — AMLODIPINE BESYLATE 10 MG PO TABS: ORAL_TABLET | ORAL | Status: DC

## 2012-07-18 NOTE — Addendum Note (Signed)
Addended by: Marily Memos, DAVID J on: 07/18/2012 01:37 PM   Modules accepted: Orders

## 2012-07-23 ENCOUNTER — Other Ambulatory Visit: Payer: Self-pay | Admitting: *Deleted

## 2012-07-23 MED ORDER — AMLODIPINE BESYLATE 10 MG PO TABS: ORAL_TABLET | ORAL | Status: DC

## 2012-08-26 ENCOUNTER — Other Ambulatory Visit: Payer: Self-pay | Admitting: *Deleted

## 2012-08-26 MED ORDER — OMEPRAZOLE 20 MG PO CPDR: 20.0000 mg | DELAYED_RELEASE_CAPSULE | Freq: Every day | ORAL | Status: DC | PRN

## 2012-10-02 ENCOUNTER — Ambulatory Visit (INDEPENDENT_AMBULATORY_CARE_PROVIDER_SITE_OTHER): Payer: 59 | Admitting: Family Medicine

## 2012-10-02 ENCOUNTER — Encounter: Payer: Self-pay | Admitting: Family Medicine

## 2012-10-02 VITALS — BP 148/79 | HR 88 | Temp 98.4°F | Wt 222.0 lb

## 2012-10-02 DIAGNOSIS — I10 Essential (primary) hypertension: Secondary | ICD-10-CM

## 2012-10-02 DIAGNOSIS — E11628 Type 2 diabetes mellitus with other skin complications: Secondary | ICD-10-CM

## 2012-10-02 DIAGNOSIS — K219 Gastro-esophageal reflux disease without esophagitis: Secondary | ICD-10-CM

## 2012-10-02 DIAGNOSIS — IMO0001 Reserved for inherently not codable concepts without codable children: Secondary | ICD-10-CM

## 2012-10-02 DIAGNOSIS — Z23 Encounter for immunization: Secondary | ICD-10-CM

## 2012-10-02 DIAGNOSIS — E785 Hyperlipidemia, unspecified: Secondary | ICD-10-CM

## 2012-10-02 DIAGNOSIS — M549 Dorsalgia, unspecified: Secondary | ICD-10-CM

## 2012-10-02 DIAGNOSIS — L089 Local infection of the skin and subcutaneous tissue, unspecified: Secondary | ICD-10-CM

## 2012-10-02 DIAGNOSIS — E1169 Type 2 diabetes mellitus with other specified complication: Secondary | ICD-10-CM

## 2012-10-02 LAB — POCT GLYCOSYLATED HEMOGLOBIN (HGB A1C): Hemoglobin A1C: 11.1

## 2012-10-02 MED ORDER — IBUPROFEN 400 MG PO TABS: 400.0000 mg | ORAL_TABLET | Freq: Four times a day (QID) | ORAL | Status: DC | PRN

## 2012-10-02 MED ORDER — ATORVASTATIN CALCIUM 40 MG PO TABS: 40.0000 mg | ORAL_TABLET | Freq: Every day | ORAL | Status: DC

## 2012-10-02 MED ORDER — INSULIN GLARGINE 100 UNIT/ML SOLOSTAR PEN: PEN_INJECTOR | SUBCUTANEOUS | Status: DC

## 2012-10-02 NOTE — Assessment & Plan Note (Addendum)
10 yr AVSCD risk >30 %   Starting lipitor CMET Lipid profile

## 2012-10-02 NOTE — Assessment & Plan Note (Signed)
Continue regular podiatry appts

## 2012-10-02 NOTE — Assessment & Plan Note (Signed)
Continue omeprazole prn

## 2012-10-02 NOTE — Patient Instructions (Addendum)
Please keep your appointments with the opthalmologist and podiatrist Please change your insulin dosing.  Please inject 60 units of lantus in the morning Please inject 30 units of lantus at night (increase your dose by 3 units every night that your morning blood sugar is above 180 and decrease your dose by 3 units every night that your morning sugar is below 100) Please come back for fasting blood work Please start the lipitor for your cholesterol Please come back in 1 months with your blood sugar log. Come back sooner if needed.

## 2012-10-02 NOTE — Assessment & Plan Note (Signed)
Still out of control. Not followiong titration appropriately Change to lantus 60Qam and 30 QHS w/ titration of 3 units at QHS down if am glucose below 100 and up if glucose above 180. F/u w/ optho and podiatrist as already scheduled

## 2012-10-02 NOTE — Assessment & Plan Note (Signed)
Elevated today. Will monitor for now ans make adjustments in the future Pt very resistant as on 4 drug therapy

## 2012-10-02 NOTE — Progress Notes (Signed)
Rachel Chaney is a 52 y.o. female who presents to [facility] today for DM f/u   DM f/u: no further foot infections/sores. Denies peripheral neuropathy. Currently taking lantus 85 in the am. CBG around 180-200. No sx of low sugars. Denies vision change. Eye appt w/ Dr. Baird Cancer set for end of October. Going to podiatrist regularly  As from my previous A/P for DM "Spent >10 min discussing rirsks of poorly controlled DM  Pt to increase Lantus to 85 units tomorrow then increase by 3 units daily until BS is below 200 in the morning. Decrease insulin by 3 units daily if BS is below 100.  Pt to increase mealtime coverage to 10 units w/ meals  Pt educated on titration and amenable  Bring in sugar log"  GERD: taking prilosec once every 2 weeks w/ flares. Heavy/greesy foods are triggers.  HTN: denies CP, SOb, palpitations. Taking all 4 medications as prescribed  HLD: not taking cholesterol medicaitons  The following portions of the patient's history were reviewed and updated as appropriate: allergies, current medications, past medical history, family and social history, and problem list.  [name]  Past Medical History  Diagnosis Date  . Diabetes mellitus   . Hyperlipidemia   . Hypertension     ROS as above otherwise neg.    Medications reviewed. Current Outpatient Prescriptions  Medication Sig Dispense Refill  . acetaminophen (TYLENOL) 500 MG tablet Take 1,000 mg by mouth every 6 (six) hours as needed for pain.      Marland Kitchen amLODipine (NORVASC) 10 MG tablet TAKE 1 TABLET BY MOUTH ONCE A DAY  30 tablet  3  . aspirin EC 81 MG tablet Take 81 mg by mouth daily.      . carvedilol (COREG) 25 MG tablet Take 1 tablet (25 mg total) by mouth 2 (two) times daily with a meal.  60 tablet  3  . enalapril (VASOTEC) 20 MG tablet Take 40 mg by mouth daily.      Marland Kitchen gabapentin (NEURONTIN) 300 MG capsule Take 300 mg by mouth 2 (two) times daily as needed (pain).      . hydrochlorothiazide (HYDRODIURIL) 25  MG tablet Take 1 tablet (25 mg total) by mouth daily.  90 tablet  3  . Insulin Glargine (LANTUS SOLOSTAR) 100 UNIT/ML SOPN Inject 85 Units into the skin at bedtime. Inject 85 Units into the skin at bedtime. Increase 3 units for sugars greater than 200, decrease 3 units for sugars less than 100  15 mL  12  . omeprazole (PRILOSEC) 20 MG capsule Take 1 capsule (20 mg total) by mouth daily as needed (acid reflux).  90 capsule  0  . cetirizine (ZYRTEC) 10 MG tablet Take 10 mg by mouth daily as needed for allergies.      Marland Kitchen glucose blood (ONE TOUCH ULTRA TEST) test strip Use as instructed       . ibuprofen (ADVIL,MOTRIN) 200 MG tablet Take 400 mg by mouth every 6 (six) hours as needed for pain.      Marland Kitchen insulin aspart (NOVOLOG FLEXPEN) 100 UNIT/ML injection Inject 7 Units into the skin 3 (three) times daily before meals.  1 vial  12  . Insulin Pen Needle 29G X 12MM MISC Inject 1 pen into the skin 2 (two) times daily. Check blood sugar daily.  100 each  6  . omeprazole (PRILOSEC) 20 MG capsule Take 1 capsule (20 mg total) by mouth daily.  90 capsule  3  . [facility] DELICA  LANCETS 33G MISC 1 Device by Does not apply route as needed (to check blood glucose).  100 each  3  . [DISCONTINUED] progesterone (PROMETRIUM) 200 MG capsule Take 1 capsule (200 mg total) by mouth daily. Take for 10 days if bleeding occurs for more than 4 days.  10 capsule  1   No current facility-administered medications for this visit.    Exam:  BP 148/79  Pulse 88  Temp(Src) 98.4 F (36.9 C) (Oral)  Wt 222 lb (100.699 kg)  BMI 38.09 kg/m2  LMP 09/18/2011 Gen: Well NAD HEENT: EOMI,  MMM Lungs: CTABL Nl WOB Heart: RRR no MRG Abd: NABS, NT, ND Exts: Non edematous BL  LE, warm and well perfused.   Results for orders placed in visit on 10/02/12 (from the past 72 hour(s))  POCT GLYCOSYLATED HEMOGLOBIN (HGB A1C)     Status: Abnormal   Collection Time    10/02/12  1:53 PM      Result Value Range   Hemoglobin A1C 11.1

## 2012-11-22 ENCOUNTER — Other Ambulatory Visit: Payer: Self-pay | Admitting: Family Medicine

## 2012-11-25 NOTE — Telephone Encounter (Signed)
Pt called and needs refills on her medications enalapril and amlodipine sent to [facility]. jw

## 2012-11-27 NOTE — Telephone Encounter (Signed)
Pt has called again about her refills. She is completely out of the meds Please let her know when they have been called in

## 2012-11-29 MED ORDER — CARVEDILOL 25 MG PO TABS: 25.0000 mg | ORAL_TABLET | Freq: Two times a day (BID) | ORAL | Status: DC

## 2012-11-29 MED ORDER — ENALAPRIL MALEATE 20 MG PO TABS: ORAL_TABLET | ORAL | Status: DC

## 2012-11-29 MED ORDER — AMLODIPINE BESYLATE 10 MG PO TABS: ORAL_TABLET | ORAL | Status: DC

## 2012-11-29 NOTE — Addendum Note (Signed)
Addended by: Marily Memos, Tyrail Grandfield J on: 11/29/2012 04:05 PM   Modules accepted: Orders

## 2012-11-29 NOTE — Telephone Encounter (Signed)
Pt has called back again and would like to know when the doctor is going to refill her medications. She is completley out and needs this done ASAP. jw

## 2012-11-29 NOTE — Telephone Encounter (Signed)
Rx for norvasc, enalapril, carvedilol sent to pharmacy  Linna Darner, MD Family Medicine PGY-3 11/29/2012, 4:04 PM

## 2013-01-25 ENCOUNTER — Other Ambulatory Visit: Payer: Self-pay | Admitting: Family Medicine

## 2013-02-19 ENCOUNTER — Encounter: Payer: Self-pay | Admitting: Family Medicine

## 2013-02-19 ENCOUNTER — Ambulatory Visit (INDEPENDENT_AMBULATORY_CARE_PROVIDER_SITE_OTHER): Payer: 59 | Admitting: Family Medicine

## 2013-02-19 VITALS — BP 142/80 | HR 87 | Temp 98.6°F | Ht 64.0 in | Wt 245.0 lb

## 2013-02-19 DIAGNOSIS — R609 Edema, unspecified: Secondary | ICD-10-CM

## 2013-02-19 DIAGNOSIS — M549 Dorsalgia, unspecified: Secondary | ICD-10-CM

## 2013-02-19 DIAGNOSIS — IMO0001 Reserved for inherently not codable concepts without codable children: Secondary | ICD-10-CM

## 2013-02-19 DIAGNOSIS — E1165 Type 2 diabetes mellitus with hyperglycemia: Principal | ICD-10-CM

## 2013-02-19 DIAGNOSIS — I10 Essential (primary) hypertension: Secondary | ICD-10-CM

## 2013-02-19 DIAGNOSIS — R600 Localized edema: Secondary | ICD-10-CM

## 2013-02-19 DIAGNOSIS — E785 Hyperlipidemia, unspecified: Secondary | ICD-10-CM

## 2013-02-19 LAB — POCT GLYCOSYLATED HEMOGLOBIN (HGB A1C): Hemoglobin A1C: 8.1

## 2013-02-19 MED ORDER — TRAMADOL HCL 50 MG PO TABS: 50.0000 mg | ORAL_TABLET | Freq: Three times a day (TID) | ORAL | Status: DC | PRN

## 2013-02-19 MED ORDER — HYDROCHLOROTHIAZIDE 50 MG PO TABS: ORAL_TABLET | ORAL | Status: DC

## 2013-02-19 MED ORDER — CYCLOBENZAPRINE HCL 5 MG PO TABS: 5.0000 mg | ORAL_TABLET | Freq: Every day | ORAL | Status: AC | PRN

## 2013-02-19 NOTE — Patient Instructions (Signed)
You are doing well overall Your back pain is likely from muscle strain. This will improve with flexeril, massage, heating pads, stretching, and most importantly weight loss. Your diabetes is under good control and with a little more weight loss you can get that number under 8 (which is your goal). Please come back for fasting labs at your convenience Please come back in 4 weeks to evaluate your back pain and blood pressure Please start the increased dose of HCTZ Your leg swelling will improve with weight loss. Elevate your legs at night and get exercise.

## 2013-02-19 NOTE — Assessment & Plan Note (Addendum)
A1c today at 8.1 which is much improved Goal is less than 8 Continue current therapy w/ improved diet

## 2013-02-19 NOTE — Assessment & Plan Note (Signed)
Likely muscle spasm w/o sign of nerve root compression Cont flexeril.  NSAIDs are not an option due to renal fxn Tramadol PRN Stretching, massage, heating pad, WT LOSS

## 2013-02-19 NOTE — Assessment & Plan Note (Addendum)
Likely related to dependent edema from venous inssuficiency, but may also be complicated by wt gain, HTN, and norvasc.  Wt loss, DC norvasc, elevate legs Unable to stop norvasc due to BP elevation Consider compression stocking if continues to worsen

## 2013-02-19 NOTE — Assessment & Plan Note (Signed)
Continue lipitor Fasting lipid panel prior to next appt

## 2013-02-19 NOTE — Progress Notes (Signed)
Rachel Chaney is a 53 y.o. female who presents to [facility] today for back pain.  Back pain: 2 wks. Located in lumbar spine w/ some radiation caudally. Achy w/ occasional sharpness. Worse after prolonged standing. No change in exercise routine lately. Wt gain of 23lbs. Deneis any loss of sensation in legs, weakness, or loss of bowel or bladder function. Flexeril x1 w/ benefit   DM: glucose  Typically in high 100s in the am. No sugar extremes or symptoms of low or high sugars. Lantus 60 in am and 30 in evening.   HTN: taking enalapril, coreg, norvasc, HCTZ. Denies CP, palpitations. Leg swelling started last year and is slowly worsening.   The following portions of the patient's history were reviewed and updated as appropriate: allergies, current medications, past medical history, family and social history, and problem list.  [name].   Past Medical History  Diagnosis Date  . Diabetes mellitus   . Hyperlipidemia   . Hypertension     ROS as above otherwise neg.    Medications reviewed. Current Outpatient Prescriptions  Medication Sig Dispense Refill  . acetaminophen (TYLENOL) 500 MG tablet Take 1,000 mg by mouth every 6 (six) hours as needed for pain.      Marland Kitchen amLODipine (NORVASC) 10 MG tablet TAKE 1 TABLET BY MOUTH ONCE A DAY  30 tablet  11  . aspirin EC 81 MG tablet Take 81 mg by mouth daily.      Marland Kitchen atorvastatin (LIPITOR) 40 MG tablet Take 1 tablet (40 mg total) by mouth daily.  90 tablet  3  . carvedilol (COREG) 25 MG tablet Take 1 tablet (25 mg total) by mouth 2 (two) times daily with a meal.  60 tablet  11  . cetirizine (ZYRTEC) 10 MG tablet Take 10 mg by mouth daily as needed for allergies.      Marland Kitchen enalapril (VASOTEC) 20 MG tablet TAKE 2 TABLETS BY MOUTH DAILY.  60 tablet  11  . gabapentin (NEURONTIN) 300 MG capsule Take 300 mg by mouth 2 (two) times daily as needed (pain).      Marland Kitchen glucose blood (ONE TOUCH ULTRA TEST) test strip Use as instructed       . hydrochlorothiazide  (HYDRODIURIL) 50 MG tablet TAKE 1 TABLET (25 MG TOTAL) BY MOUTH DAILY.  90 tablet  3  . insulin aspart (NOVOLOG FLEXPEN) 100 UNIT/ML injection Inject 7 Units into the skin 3 (three) times daily before meals.  1 vial  12  . Insulin Glargine (LANTUS SOLOSTAR) 100 UNIT/ML SOPN Inject 60 Units into the skin in the morning and 30 units at bedtime. Increase bedtime dose by 3 units for sugars greater than 180, decrease 3 units for sugars less than 100.  15 mL  12  . Insulin Pen Needle 29G X 12MM MISC Inject 1 pen into the skin 2 (two) times daily. Check blood sugar daily.  100 each  6  . omeprazole (PRILOSEC) 20 MG capsule Take 1 capsule (20 mg total) by mouth daily.  90 capsule  3  . omeprazole (PRILOSEC) 20 MG capsule Take 1 capsule (20 mg total) by mouth daily as needed (acid reflux).  90 capsule  0  . ONETOUCH DELICA LANCETS 99991111 MISC 1 Device by Does not apply route as needed (to check blood glucose).  100 each  3  . traMADol (ULTRAM) 50 MG tablet Take 1 tablet (50 mg total) by mouth every 8 (eight) hours as needed.  30 tablet  0  . [  DISCONTINUED] progesterone (PROMETRIUM) 200 MG capsule Take 1 capsule (200 mg total) by mouth daily. Take for 10 days if bleeding occurs for more than 4 days.  10 capsule  1   No current facility-administered medications for this visit.    Exam: BP 142/80  Pulse 87  Temp(Src) 98.6 F (37 C) (Oral)  Ht 5\' 4"  (1.626 m)  Wt 245 lb (111.131 kg)  BMI 42.03 kg/m2  LMP 09/18/2011 Gen: Well NAD HEENT: EOMI,  MMM Lungs: CTABL Nl WOB Heart: RRR no MRG Abd: NABS, NT, ND MSK: lumbar perispinal muscles ttp, no bony abnormality. Ambulation nml. striaght leg raise nml Exts: Bilat LE 1+ pitting edema.   Results for orders placed in visit on 02/19/13 (from the past 72 hour(s))  POCT GLYCOSYLATED HEMOGLOBIN (HGB A1C)     Status: Abnormal   Collection Time    02/19/13  9:45 AM      Result Value Ref Range   Hemoglobin A1C 8.1      A/P (as seen in Problem list)  Type II  or unspecified type diabetes mellitus without mention of complication, uncontrolled A1c today at 8.1 which is much improved Goal is less than 8 Continue current therapy w/ improved diet  Back pain Likely muscle spasm w/o sign of nerve root compression Cont flexeril.  NSAIDs are not an option due to renal fxn Tramadol PRN Stretching, massage, heating pad, WT LOSS  Lower extremity edema Likely related to dependent edema from venous inssuficiency, but may also be complicated by wt gain, HTN, and norvasc.  Wt loss, DC norvasc, elevate legs Unable to stop norvasc due to BP elevation Consider compression stocking if continues to worsen  HYPERLIPIDEMIA Continue lipitor Fasting lipid panel prior to next appt  HYPERTENSION, BENIGN SYSTEMIC BP not at goal on 2 checks Inc HCTZ On max Carvedilol adn enalapril. AMlodipine can go up as well but pt developing LE edema

## 2013-02-19 NOTE — Assessment & Plan Note (Signed)
BP not at goal on 2 checks Inc HCTZ On max Carvedilol adn enalapril. AMlodipine can go up as well but pt developing LE edema

## 2013-02-19 NOTE — Addendum Note (Signed)
Addended by: Waldemar Dickens on: 02/19/2013 10:33 AM   Modules accepted: Orders

## 2013-03-04 ENCOUNTER — Telehealth: Payer: Self-pay | Admitting: *Deleted

## 2013-03-04 DIAGNOSIS — M549 Dorsalgia, unspecified: Secondary | ICD-10-CM

## 2013-03-04 NOTE — Telephone Encounter (Signed)
[facility] requesting refill of ibuprofen 400 mg #30--take 1 tab po every 6 hrs prn pain.  Will route request to PCP. Burna Forts, BSN, RN-BC

## 2013-03-05 MED ORDER — IBUPROFEN 400 MG PO TABS: 400.0000 mg | ORAL_TABLET | Freq: Four times a day (QID) | ORAL | Status: DC | PRN

## 2013-04-19 ENCOUNTER — Ambulatory Visit (INDEPENDENT_AMBULATORY_CARE_PROVIDER_SITE_OTHER): Payer: 59 | Admitting: Family Medicine

## 2013-04-19 VITALS — BP 104/90 | HR 71 | Temp 98.5°F | Resp 15 | Ht 63.0 in | Wt 228.0 lb

## 2013-04-19 DIAGNOSIS — E1169 Type 2 diabetes mellitus with other specified complication: Secondary | ICD-10-CM

## 2013-04-19 DIAGNOSIS — H1045 Other chronic allergic conjunctivitis: Secondary | ICD-10-CM

## 2013-04-19 DIAGNOSIS — D72829 Elevated white blood cell count, unspecified: Secondary | ICD-10-CM

## 2013-04-19 DIAGNOSIS — R42 Dizziness and giddiness: Secondary | ICD-10-CM

## 2013-04-19 DIAGNOSIS — H1010 Acute atopic conjunctivitis, unspecified eye: Secondary | ICD-10-CM

## 2013-04-19 DIAGNOSIS — R51 Headache: Secondary | ICD-10-CM

## 2013-04-19 DIAGNOSIS — H269 Unspecified cataract: Secondary | ICD-10-CM

## 2013-04-19 DIAGNOSIS — E11649 Type 2 diabetes mellitus with hypoglycemia without coma: Secondary | ICD-10-CM

## 2013-04-19 LAB — POCT CBC
Granulocyte percent: 71.8 %G (ref 37–80)
HCT, POC: 37.7 % (ref 37.7–47.9)
Hemoglobin: 11.6 g/dL — AB (ref 12.2–16.2)
Lymph, poc: 4 — AB (ref 0.6–3.4)
MCH, POC: 26.3 pg — AB (ref 27–31.2)
MCHC: 30.8 g/dL — AB (ref 31.8–35.4)
MCV: 85.5 fL (ref 80–97)
MID (cbc): 0.8 (ref 0–0.9)
MPV: 8.9 fL (ref 0–99.8)
POC Granulocyte: 12.3 — AB (ref 2–6.9)
POC LYMPH PERCENT: 23.3 %L (ref 10–50)
POC MID %: 4.9 %M (ref 0–12)
Platelet Count, POC: 398 10*3/uL (ref 142–424)
RBC: 4.41 M/uL (ref 4.04–5.48)
RDW, POC: 16.5 %
WBC: 17.1 10*3/uL — AB (ref 4.6–10.2)

## 2013-04-19 LAB — BASIC METABOLIC PANEL
BUN: 20 mg/dL (ref 6–23)
CO2: 28 mEq/L (ref 19–32)
Calcium: 8.9 mg/dL (ref 8.4–10.5)
Chloride: 107 mEq/L (ref 96–112)
Creat: 1.42 mg/dL — ABNORMAL HIGH (ref 0.50–1.10)
Glucose, Bld: 75 mg/dL (ref 70–99)
Potassium: 3.5 mEq/L (ref 3.5–5.3)
Sodium: 143 mEq/L (ref 135–145)

## 2013-04-19 LAB — POCT GLYCOSYLATED HEMOGLOBIN (HGB A1C): Hemoglobin A1C: 7.7

## 2013-04-19 LAB — GLUCOSE, POCT (MANUAL RESULT ENTRY): POC Glucose: 65 mg/dl — AB (ref 70–99)

## 2013-04-19 MED ORDER — BUTALBITAL-APAP-CAFFEINE 50-325-40 MG PO TABS: 1.0000 | ORAL_TABLET | Freq: Four times a day (QID) | ORAL | Status: DC | PRN

## 2013-04-19 NOTE — Progress Notes (Signed)
Subjective: Patient is here with history of having been having problems with dizziness and weakness. She's been checking her sugars at home and gotten some readings down in the 70 range. She used to usually run around 170 in the mornings. She's been having problems with dizziness and lightheadedness and headaches. She has the nausea but no vomiting. She has not hurting anywhere. Medicines have not been changed recently. She has a regular physician, and last saw him in February and is due to see him this month.  She has had an elevated white blood count in the past, but was told that it was a little better last time. In reviewing her old records she has had a chronic leukocytosis and persistent anemia  No chest pains or breathing problems she does work Therapist, art.  Objective: Pleasant lady in no acute distress. Alert and oriented. He says she feels weak and tired. Her TMs are normal. Eyes PERRLA. Throat clear. Neck supple without nodes. Chest clear. Heart regular without murmurs. Back edema.   Results for orders placed in visit on 04/19/13  GLUCOSE, POCT (MANUAL RESULT ENTRY)      Result Value Ref Range   POC Glucose 65 (*) 70 - 99 mg/dl  POCT GLYCOSYLATED HEMOGLOBIN (HGB A1C)      Result Value Ref Range   Hemoglobin A1C 7.7    POCT CBC      Result Value Ref Range   WBC 17.1 (*) 4.6 - 10.2 K/uL   Lymph, poc 4.0 (*) 0.6 - 3.4   POC LYMPH PERCENT 23.3  10 - 50 %L   MID (cbc) 0.8  0 - 0.9   POC MID % 4.9  0 - 12 %M   POC Granulocyte 12.3 (*) 2 - 6.9   Granulocyte percent 71.8  37 - 80 %G   RBC 4.41  4.04 - 5.48 M/uL   Hemoglobin 11.6 (*) 12.2 - 16.2 g/dL   HCT, POC 37.7  37.7 - 47.9 %   MCV 85.5  80 - 97 fL   MCH, POC 26.3 (*) 27 - 31.2 pg   MCHC 30.8 (*) 31.8 - 35.4 g/dL   RDW, POC 16.5     Platelet Count, POC 398  142 - 424 K/uL   MPV 8.9  0 - 99.8 fL   Assessment: Hypoglycemia Diabetes mellitus Anemia, somewhat improved Chronic leukocytosis (this time the count was  actually a little better than other times, but still is quite high   Plan: Gave her some Gatorade and crackers here in the office. Her husband is driving her home. Referred to a hematologist with regard to the blood counts Decrease her insulin from 60 to 50 in the mornings. Make sure she eats a little in the morning with her insulin. Followup with her primary care doctor

## 2013-04-19 NOTE — Patient Instructions (Addendum)
Make sure you keep your appointment with your primary care doctor. At that time newspaper to him so he can see what I've done with you.  Decrease your insulin to 50 units in the morning. Make sure you eat a little breakfast.  Continue to try to get some regular exercise.  When you get feeling weak and lightheaded and dizzy check your sugars. If it is low, eat something that will increase the sugar. Keep a record of this for you Dr.  Dennis Bast have a little anemia but also have an elevated white blood count that has persisted over the last year. I am making a referral to a blood specialist for you.(Hematologist/oncologist)  Return as needed  Take the Fioricet 1 every 6 hours only when needed for headaches.

## 2013-04-22 ENCOUNTER — Telehealth: Payer: Self-pay | Admitting: Oncology

## 2013-04-22 ENCOUNTER — Encounter: Payer: Self-pay | Admitting: Family Medicine

## 2013-04-22 NOTE — Telephone Encounter (Signed)
LEFT MESSAGE FOR PATIENT TO RETURN CALL TO SCHEDULE NEW PATIENT APPT.  °

## 2013-04-23 ENCOUNTER — Telehealth: Payer: Self-pay | Admitting: Family Medicine

## 2013-04-23 DIAGNOSIS — IMO0001 Reserved for inherently not codable concepts without codable children: Secondary | ICD-10-CM

## 2013-04-23 DIAGNOSIS — IMO0002 Reserved for concepts with insufficient information to code with codable children: Secondary | ICD-10-CM

## 2013-04-23 DIAGNOSIS — E1169 Type 2 diabetes mellitus with other specified complication: Secondary | ICD-10-CM

## 2013-04-23 DIAGNOSIS — E1165 Type 2 diabetes mellitus with hyperglycemia: Secondary | ICD-10-CM

## 2013-04-23 NOTE — Telephone Encounter (Signed)
Pt called and needs a new prescription for her syringes because she is using 50 in the morning and 30 at night. She also needs her lantus called in too. jw

## 2013-04-25 NOTE — Telephone Encounter (Signed)
Pt is calling and wanted a status update on when the doctor was going to refill her syringes. jw

## 2013-04-29 MED ORDER — INSULIN ASPART 100 UNIT/ML ~~LOC~~ SOLN: 7.0000 [IU] | Freq: Three times a day (TID) | SUBCUTANEOUS | Status: DC

## 2013-04-29 MED ORDER — SYRINGE (DISPOSABLE) 1 ML MISC: Status: DC

## 2013-04-29 MED ORDER — INSULIN PEN NEEDLE 29G X 12MM MISC: 1.0000 "pen " | Freq: Two times a day (BID) | Status: DC

## 2013-04-29 MED ORDER — ONETOUCH DELICA LANCETS 33G MISC: 1.0000 | Status: DC | PRN

## 2013-04-29 MED ORDER — INSULIN GLARGINE 100 UNIT/ML SOLOSTAR PEN: PEN_INJECTOR | SUBCUTANEOUS | Status: DC

## 2013-04-29 NOTE — Telephone Encounter (Signed)
Medications and syringes refilled and waiting for pick up at the pharmacy

## 2013-04-29 NOTE — Telephone Encounter (Signed)
LM for patient "rx were sent to pharmacy."  [facility]

## 2013-05-02 ENCOUNTER — Telehealth: Payer: Self-pay | Admitting: Family Medicine

## 2013-05-02 DIAGNOSIS — E1165 Type 2 diabetes mellitus with hyperglycemia: Principal | ICD-10-CM

## 2013-05-02 DIAGNOSIS — IMO0001 Reserved for inherently not codable concepts without codable children: Secondary | ICD-10-CM

## 2013-05-02 MED ORDER — INSULIN ASPART 100 UNIT/ML ~~LOC~~ SOLN: 7.0000 [IU] | Freq: Three times a day (TID) | SUBCUTANEOUS | Status: DC

## 2013-05-02 MED ORDER — ONETOUCH DELICA LANCETS 33G MISC: 1.0000 | Status: DC | PRN

## 2013-05-02 NOTE — Telephone Encounter (Signed)
Needs the insulin the vilae because she cannot afford the pen now One touch ultra strips [facility] at [place]

## 2013-05-02 NOTE — Telephone Encounter (Signed)
Refills for one touch Ultra strips sent in  Novolog vial Rx sent (7 units TID AC)

## 2013-05-05 MED ORDER — GLUCOSE BLOOD VI STRP: ORAL_STRIP | Status: DC

## 2013-05-05 MED ORDER — INSULIN GLARGINE 100 UNIT/ML ~~LOC~~ SOLN: SUBCUTANEOUS | Status: DC

## 2013-05-05 NOTE — Telephone Encounter (Signed)
Will forward to Dr. Sheral Apley since Dr. Marily Memos is out of office. Loralyn Rachel,CMA

## 2013-05-05 NOTE — Telephone Encounter (Signed)
Needs the lantus in the vial and the one touch strips

## 2013-05-05 NOTE — Telephone Encounter (Signed)
Lantus vials (#10 vials with prn refills) sent to [facility] with the same directions Dr. Marily Memos had on pen Rx. Strips sent in as well.  Thanks, please let me know if this is incorrect.  Donnah Levert M. Verne Lanuza, M.D.

## 2013-05-05 NOTE — Addendum Note (Signed)
Addended by: Montez Morita on: 05/05/2013 04:07 PM   Modules accepted: Orders

## 2013-05-05 NOTE — Telephone Encounter (Signed)
Pt is aware of refills. Jazmin Hartsell,CMA

## 2013-05-06 NOTE — Telephone Encounter (Signed)
Pt says her copay is now $40 for her Lantus vials.   She was getting it for free. She wants to know if we have any here

## 2013-05-07 MED ORDER — INSULIN GLARGINE 100 UNIT/ML ~~LOC~~ SOLN: SUBCUTANEOUS | Status: DC

## 2013-05-07 NOTE — Telephone Encounter (Signed)
Called pharmacy.  $40 is her copay no matter how we write the Rx.  Pt informed she could pick up samples.  Information about vials were documented. Rachel Chaney

## 2013-05-07 NOTE — Addendum Note (Signed)
Addended by: Christen Bame D on: 05/07/2013 11:46 AM   Modules accepted: Orders

## 2013-05-16 ENCOUNTER — Ambulatory Visit (INDEPENDENT_AMBULATORY_CARE_PROVIDER_SITE_OTHER): Payer: 59 | Admitting: Family Medicine

## 2013-05-16 VITALS — BP 160/92 | HR 82 | Temp 97.9°F | Resp 16 | Wt 230.0 lb

## 2013-05-16 DIAGNOSIS — R51 Headache: Secondary | ICD-10-CM

## 2013-05-16 DIAGNOSIS — E1165 Type 2 diabetes mellitus with hyperglycemia: Secondary | ICD-10-CM

## 2013-05-16 DIAGNOSIS — D72829 Elevated white blood cell count, unspecified: Secondary | ICD-10-CM

## 2013-05-16 DIAGNOSIS — I10 Essential (primary) hypertension: Secondary | ICD-10-CM

## 2013-05-16 DIAGNOSIS — D649 Anemia, unspecified: Secondary | ICD-10-CM

## 2013-05-16 DIAGNOSIS — E785 Hyperlipidemia, unspecified: Secondary | ICD-10-CM

## 2013-05-16 DIAGNOSIS — IMO0001 Reserved for inherently not codable concepts without codable children: Secondary | ICD-10-CM

## 2013-05-16 LAB — CBC
HCT: 31.8 % — ABNORMAL LOW (ref 36.0–46.0)
Hemoglobin: 10.4 g/dL — ABNORMAL LOW (ref 12.0–15.0)
MCH: 26.1 pg (ref 26.0–34.0)
MCHC: 32.7 g/dL (ref 30.0–36.0)
MCV: 79.9 fL (ref 78.0–100.0)
Platelets: 352 10*3/uL (ref 150–400)
RBC: 3.98 MIL/uL (ref 3.87–5.11)
RDW: 15.7 % — ABNORMAL HIGH (ref 11.5–15.5)
WBC: 14.6 10*3/uL — ABNORMAL HIGH (ref 4.0–10.5)

## 2013-05-16 LAB — COMPREHENSIVE METABOLIC PANEL
ALT: 14 U/L (ref 0–35)
AST: 15 U/L (ref 0–37)
Albumin: 2.7 g/dL — ABNORMAL LOW (ref 3.5–5.2)
Alkaline Phosphatase: 76 U/L (ref 39–117)
BUN: 24 mg/dL — ABNORMAL HIGH (ref 6–23)
CO2: 27 mEq/L (ref 19–32)
Calcium: 8.4 mg/dL (ref 8.4–10.5)
Chloride: 107 mEq/L (ref 96–112)
Creat: 1.44 mg/dL — ABNORMAL HIGH (ref 0.50–1.10)
Glucose, Bld: 112 mg/dL — ABNORMAL HIGH (ref 70–99)
Potassium: 3.5 mEq/L (ref 3.5–5.3)
Sodium: 144 mEq/L (ref 135–145)
Total Bilirubin: 0.3 mg/dL (ref 0.2–1.2)
Total Protein: 5.5 g/dL — ABNORMAL LOW (ref 6.0–8.3)

## 2013-05-16 LAB — LIPID PANEL
Cholesterol: 275 mg/dL — ABNORMAL HIGH (ref 0–200)
HDL: 31 mg/dL — ABNORMAL LOW (ref >39)
LDL Cholesterol: 195 mg/dL — ABNORMAL HIGH (ref 0–99)
Total CHOL/HDL Ratio: 8.9 Ratio
Triglycerides: 247 mg/dL — ABNORMAL HIGH (ref <150)
VLDL: 49 mg/dL — ABNORMAL HIGH (ref 0–40)

## 2013-05-16 LAB — GLUCOSE, CAPILLARY: Glucose-Capillary: 123 mg/dL — ABNORMAL HIGH (ref 70–99)

## 2013-05-16 NOTE — Patient Instructions (Addendum)
Blood Pressure  Please restart your amlodipine daily  It may cause some swelling but we need to control your blood pressure  Headaches/high infection fighting cells  May be due to blood pressure running high or cataracts  I am also concerned about an underlying cancer in your blood  I want you to get to the hematologist at the appointment we scheduled for you.   Please follow up with eye doctor  Please take amlodipine  See Dr. Marily Memos in 1 week for a recheck of blood pressure  I want to hold off on giving you a shot for your headache due to your elevated white count (infection fighting cells) and kidney function.   Blood Sugar  Continue taking Lantus 50 units and holding off on novolog given fasting sugars at 120 and in the evening 130  Future adjustments by Dr. Marily Memos  For your labs, I will send you a letter if there are no medication changes needed. I will call you if we need to discuss your lab results. We alre also looking at a kidney, liver, electrolyte test as well as cholesterol. I may forward these results to Dr. Marily Memos to discuss with you.  Orders Placed This Encounter  Procedures  . CBC

## 2013-05-17 ENCOUNTER — Encounter: Payer: Self-pay | Admitting: Family Medicine

## 2013-05-17 DIAGNOSIS — D638 Anemia in other chronic diseases classified elsewhere: Secondary | ICD-10-CM | POA: Insufficient documentation

## 2013-05-17 DIAGNOSIS — D72829 Elevated white blood cell count, unspecified: Secondary | ICD-10-CM | POA: Insufficient documentation

## 2013-05-17 NOTE — Assessment & Plan Note (Signed)
No further hypoglycemia on lantus alone. She stopped novolog on her own. Based off reported CBGs, diabetes is well controlled but will await next a1c.

## 2013-05-17 NOTE — Progress Notes (Signed)
Rachel Reddish, MD Phone: [phone]  Subjective:   Rachel Chaney is a 53 y.o. year old very pleasant female patient who presents to discuss the following issue:  Headaches Diabetes Mellitus Type II Anemia Leukocytosis Hypertension Patient seen in urgent care in early April for headaches and lightheadedness. She had been having hypoglycemic episodes and Lantus was decreased to 50 units nightly. Patient also discontinued her novolog. She states her CBGs have been typically 120 fasting in AM and 130 before bed. She states she is no longer lightheaded but headaches that headaches (whcih she does not have a history of other than mentioned in past medical history) have continued for last 6 weeks. She describes them as right sided behind her eye with a tension type feeling. She has about 2 episodes per day lasting from 10-30 minutes. Fiorcet she was given has not been beneficial as far as she knows because headaches tend to always end by 30 minutes regardless. Patient does state that she stopped taking her amlodipine at some pont lately due to swelling in her legs (which has improved off of medicine). She was not aware that her blood pressure was running higher. She is compliant with carvedilol, enalapril, and HCTZ. States had a headache when she came in but has faded to low level but still would like a "shot" to help her.   Of note, patient had anemia noted at urgent care at 11.6 and a WBC of 17k which is actually lower than her prior values which were above 30k with a diabetic infection. She was referred to hem/onc but she did not return calls of hem/onc when they called to schedule.   Past medical history-has had a headache before when she had retinal detachment before it was repaired in right eye, GERD, Diabetes, HLD, obesity, HTN ROS- no floaters or eye pain. Vision unchanged (blurry vision due to cataract that she is scheduled to have removed next week).  No nausea or vomiting or unilateral  tearing/rhinorrhe with headaches. Denies focal weakness of extremities. No fever/chills/unintentional weight loss. Otherwise, 10 point ROS negative.   Medications- reviewed and updated Current Outpatient Prescriptions  Medication Sig Dispense Refill  . acetaminophen (TYLENOL) 500 MG tablet Take 1,000 mg by mouth every 6 (six) hours as needed for pain.      Marland Kitchen amLODipine (NORVASC) 10 MG tablet TAKE 1 TABLET BY MOUTH ONCE A DAY  30 tablet  11  . aspirin EC 81 MG tablet Take 81 mg by mouth daily.      Marland Kitchen atorvastatin (LIPITOR) 40 MG tablet Take 1 tablet (40 mg total) by mouth daily.  90 tablet  3  . butalbital-acetaminophen-caffeine (FIORICET) 50-325-40 MG per tablet Take 1 tablet by mouth every 6 (six) hours as needed for headache.  20 tablet  0  . carvedilol (COREG) 25 MG tablet Take 1 tablet (25 mg total) by mouth 2 (two) times daily with a meal.  60 tablet  11  . cetirizine (ZYRTEC) 10 MG tablet Take 10 mg by mouth daily as needed for allergies.      Marland Kitchen enalapril (VASOTEC) 20 MG tablet TAKE 2 TABLETS BY MOUTH DAILY.  60 tablet  11  . gabapentin (NEURONTIN) 300 MG capsule Take 300 mg by mouth 2 (two) times daily as needed (pain).      Marland Kitchen glucose blood (ONE TOUCH ULTRA TEST) test strip Use as instructed  100 each  prn  . hydrochlorothiazide (HYDRODIURIL) 50 MG tablet TAKE 1 TABLET (25 MG TOTAL) BY MOUTH  DAILY.  90 tablet  3  . ibuprofen (ADVIL,MOTRIN) 400 MG tablet Take 1 tablet (400 mg total) by mouth every 6 (six) hours as needed.  30 tablet  3  . insulin aspart (NOVOLOG) 100 UNIT/ML injection  NOT CURRENTLY TAKING Inject 7 Units into the skin 3 (three) times daily with meals.  10 mL  11  . insulin glargine (LANTUS) 100 UNIT/ML injection 60 Units qam and 30 units qhs. Increase qhs dose by 3 units for sugars >180, decrease 3 units for sugars <100  2 vial  0  . Insulin Pen Needle 29G X 12MM MISC Inject 1 pen into the skin 2 (two) times daily. Check blood sugar daily.  100 each  6  . omeprazole  (PRILOSEC) 20 MG capsule Take 1 capsule (20 mg total) by mouth daily as needed (acid reflux).  90 capsule  0  . ONETOUCH DELICA LANCETS 99991111 MISC 1 Device by Does not apply route as needed (to check blood glucose).  100 each  3  . Syringe, Disposable, 1 ML MISC Use as directed  100 each  prn  . traMADol (ULTRAM) 50 MG tablet Take 1 tablet (50 mg total) by mouth every 8 (eight) hours as needed.  30 tablet  0  . [DISCONTINUED] progesterone (PROMETRIUM) 200 MG capsule Take 1 capsule (200 mg total) by mouth daily. Take for 10 days if bleeding occurs for more than 4 days.  10 capsule  1   No current facility-administered medications for this visit.    Objective: BP 160/92  Pulse 82  Temp(Src) 97.9 F (36.6 C)  Resp 16  Wt 230 lb (104.327 kg)  LMP 09/18/2011 Gen: NAD, resting comfortably on table HEENT: nares normal, oropharynx normal without pharyngeal exudate, TM normal bilaterally, Mucous membranes are moist. NCAT. PERRLA. Area on head nontender. Unable to visualize optic discs bilaterallly (right side obscured by cataract) CV: RRR no mrg  Lungs: CTAB  Abd: soft/nontender/nondistended/normal bowel sounds  Skin: warm and dry, no rash  Neuro: CN II-XII intact, sensation and reflexes normal throughout, 5/5 muscle strength in bilateral upper and lower extremities. Normal finger to nose. Normal rapid alternating movements. Normal gait.   Assessment/Plan:  Leukocytosis, unspecified Reviewed chart and over last few years do not see any white count below 17k. Patient referred to hem/onc previously but did not follow up. Christen Bame, CMA called on multiple occasions and eventually able to reach hem/onc and scheduled visit for patient on 5/27. I am concerned about a possible chronic leukemia but we will rely heavily on hem/onc input. Counseled patient extensively on importance of keeping this follow up.   Headache Also concerning are the new headaches but deferred imaging of head/bones for  possible lytic lesions until seeing hem/onc. No vision changes to suggest temporal arteritis (just chronic issue with cataract). Will avoid prednisone due to possible malignancy and toradol due to CKD. Advised could continue fiorcet use.   Obtained CBC today and leukocytosis is actually at lowest level I have found but still believe patient needs hem/onc follow up.   HYPERTENSION, BENIGN SYSTEMIC Poorly controlled off of amlodipine. Possible cause of headache being uncontrolled hypertension. Asked patient to start amlodipine and follow up with Dr. Marily Memos in 1 week. She is maxed out on HCTZ, enalapril, and carvedilol.   Type II or unspecified type diabetes mellitus without mention of complication, uncontrolled No further hypoglycemia on lantus alone. She stopped novolog on her own. Based off reported CBGs, diabetes is well controlled but will await  next a1c.   Anemia Normocytic anemia persistent over last year. Stable and not quickly dropping. Unclear underlying etiology. Defer further workup to PCP or hem/onc.    Also obtained CMET and lipids from future orders previously placed by Dr. Marily Memos, will forward results to him to discuss with patietn in 1 week follow up.  Orders Placed This Encounter  Procedures  . CBC  . Glucose, capillary  . POCT glucose (manual entry)

## 2013-05-17 NOTE — Assessment & Plan Note (Signed)
Normocytic anemia persistent over last year. Stable and not quickly dropping. Unclear underlying etiology. Defer further workup to PCP or hem/onc.

## 2013-05-17 NOTE — Assessment & Plan Note (Signed)
Poorly controlled off of amlodipine. Possible cause of headache being uncontrolled hypertension. Asked patient to start amlodipine and follow up with Dr. Marily Memos in 1 week. She is maxed out on HCTZ, enalapril, and carvedilol.

## 2013-05-17 NOTE — Assessment & Plan Note (Addendum)
Reviewed chart and over last few years do not see any white count below 17k. Patient referred to hem/onc previously but did not follow up. Rachel Chaney, CMA called on multiple occasions and eventually able to reach hem/onc and scheduled visit for patient on 5/27. I am concerned about a possible chronic leukemia but we will rely heavily on hem/onc input. Counseled patient extensively on importance of keeping this follow up.   Headache Also concerning are the new headaches but deferred imaging of head/bones for possible lytic lesions until seeing hem/onc. No vision changes to suggest temporal arteritis (just chronic issue with cataract). Will avoid prednisone due to possible malignancy and toradol due to CKD. Advised could continue fiorcet use.   Obtained CBC today and leukocytosis is actually at lowest level I have found but still believe patient needs hem/onc follow up.

## 2013-05-19 ENCOUNTER — Telehealth: Payer: Self-pay | Admitting: *Deleted

## 2013-05-19 DIAGNOSIS — E785 Hyperlipidemia, unspecified: Secondary | ICD-10-CM

## 2013-05-19 NOTE — Telephone Encounter (Signed)
Message copied by Valerie Roys on Mon May 19, 2013 10:02 AM ------      Message from: Marin Olp      Created: Sat May 17, 2013  1:22 AM       Im forwarding you the labs that had previously been entered as future orders. You can discuss with her when you see her next week.             [facility] team-please inform patient she will have results discussed with Dr. Marily Memos next week but no immediate changes needed.  ------

## 2013-05-19 NOTE — Telephone Encounter (Signed)
LM for pt to call back.  Please assist her in making a follow up appt with Dr. Marily Memos.  He will discuss the labs that he previously ordered at this visit.  Thanks [employer]

## 2013-05-19 NOTE — Telephone Encounter (Signed)
Will forward to MD to call patient with results of labs. Alleta Avery,CMA

## 2013-05-19 NOTE — Telephone Encounter (Signed)
Pt called and was read previous message. She would prefer Dr. Marily Memos to call with the results. jw

## 2013-05-23 ENCOUNTER — Telehealth: Payer: Self-pay | Admitting: Family Medicine

## 2013-05-23 MED ORDER — ATORVASTATIN CALCIUM 80 MG PO TABS: 80.0000 mg | ORAL_TABLET | Freq: Every day | ORAL | Status: DC

## 2013-05-23 NOTE — Telephone Encounter (Signed)
Pt called and was told about the Lipitor 80 mg but she still wants Dr. Marily Memos to call her. jw

## 2013-05-23 NOTE — Assessment & Plan Note (Signed)
Tried to call and left a vm.  Cholesterol is continues to climb ASCVD risk is >32% given current profile Increase lipitor to 80mg  LFT nml

## 2013-05-23 NOTE — Telephone Encounter (Signed)
Patient informed of message from MD, expressed understanding. Wanted MD to know that she has still been feeling nauseated.

## 2013-05-23 NOTE — Telephone Encounter (Signed)
Tried to call and left a vm.  Cholesterol is continues to climb ASCVD risk is >32% given current profile Increase lipitor to 80mg  LFT nml

## 2013-05-23 NOTE — Addendum Note (Signed)
Addended by: Marily Memos, DAVID J on: 05/23/2013 02:10 PM   Modules accepted: Orders

## 2013-05-31 NOTE — Telephone Encounter (Signed)
Called to speak to pt again but did not answer Not sure what pt is referring to as far as nausea, as this was not previously discussed.  Pt seen in [facility] for low glucose and treated appropriately Informed pt that I would be in clinic on Wed all day and that may be the best time to try to call to speak to me.

## 2013-06-04 ENCOUNTER — Other Ambulatory Visit: Payer: 59

## 2013-06-04 ENCOUNTER — Ambulatory Visit: Payer: 59

## 2013-06-04 ENCOUNTER — Ambulatory Visit: Payer: 59 | Admitting: Oncology

## 2013-06-04 NOTE — Telephone Encounter (Signed)
Pt called back to speak to Dr but he is not on the clinic schedule today

## 2013-06-13 ENCOUNTER — Other Ambulatory Visit: Payer: Self-pay | Admitting: Medical Oncology

## 2013-06-16 ENCOUNTER — Other Ambulatory Visit: Payer: 59

## 2013-06-16 ENCOUNTER — Ambulatory Visit: Payer: 59 | Admitting: Internal Medicine

## 2013-06-16 ENCOUNTER — Ambulatory Visit: Payer: 59

## 2013-08-28 ENCOUNTER — Encounter: Payer: Self-pay | Admitting: Family Medicine

## 2013-08-28 ENCOUNTER — Ambulatory Visit (INDEPENDENT_AMBULATORY_CARE_PROVIDER_SITE_OTHER): Payer: 59 | Admitting: Family Medicine

## 2013-08-28 VITALS — BP 173/91 | HR 80 | Ht 63.0 in | Wt 228.0 lb

## 2013-08-28 DIAGNOSIS — R51 Headache: Secondary | ICD-10-CM

## 2013-08-28 DIAGNOSIS — I10 Essential (primary) hypertension: Secondary | ICD-10-CM

## 2013-08-28 DIAGNOSIS — IMO0001 Reserved for inherently not codable concepts without codable children: Secondary | ICD-10-CM

## 2013-08-28 DIAGNOSIS — E1165 Type 2 diabetes mellitus with hyperglycemia: Principal | ICD-10-CM

## 2013-08-28 DIAGNOSIS — D72829 Elevated white blood cell count, unspecified: Secondary | ICD-10-CM

## 2013-08-28 LAB — POCT GLYCOSYLATED HEMOGLOBIN (HGB A1C): Hemoglobin A1C: 11.2

## 2013-08-28 MED ORDER — AMITRIPTYLINE HCL 25 MG PO TABS: 25.0000 mg | ORAL_TABLET | Freq: Every day | ORAL | Status: DC

## 2013-08-28 MED ORDER — BUTALBITAL-APAP-CAFFEINE 50-325-40 MG PO TABS: 1.0000 | ORAL_TABLET | Freq: Four times a day (QID) | ORAL | Status: AC | PRN

## 2013-08-28 NOTE — Assessment & Plan Note (Signed)
Very poorly controlled and is now maxed out on 4 agents??? Patient attesting 100% compliance but suspect this may be different than what is reporting. Wonder if there is anything else contributing i.e. Fibromuscular dysplasia of renal aa, if she is now truly taking 4 agents Pt not interested in changing regimen today despite discussion. Will hold and rec 2 week nursing bp check Potential need for Dr. Valentina Lucks?

## 2013-08-28 NOTE — Assessment & Plan Note (Signed)
Worsening A1C despite patients reports of CBGs in 100s only. Still only on lantus alone. Suspect this is not actually correct. Recommended restarting novolog but pt denied. Very concerning but this is her personal choice. Given carb counting info

## 2013-08-28 NOTE — Patient Instructions (Signed)
Rachel Chaney, It was great to see you today!  I am pleased to hear that things are going well for you. Please make a log of both your blood pressures and sugars over the next 1-2 weeks.  Also make note of any days that you may have forgotten to take meds Schedule a nursing blood pressure check in 1-2 weeks  You can try elavil for the nerve pain.    Looking forward to seeing you soon, make appointment for 1-3 months time [name], MD

## 2013-08-28 NOTE — Progress Notes (Signed)
Patient ID: Early Chars, female   DOB: 10-03-1960, 53 y.o.   MRN: [id number]   [facility] [name], MD Phone: [phone]  Subjective:  Rachel Chaney is a 53 y.o F who is new to me but previously est in clinic # HTN -HYPERTENSION Disease Monitoring: Blood pressure range-140s Chest pain, palpitations- denies    Dyspnea- none  Medications: norvasc10mg  , carvedilol 25mg  BID, enalapril 40mg , HCTZ 25mg  Compliance- 100% Lightheadedness,Syncope- denies  Edema- none ; does have occasional headaches and peripheral tingling in legs   #DM -DIABETES Disease Monitoring: Hgb A1C 11 Blood Sugar ranges-2 hrs after eating 140s/150s; morning 130s/140s Polyuria/phagia/dipsia- denies  Visual problems- none  Medications: did at one point take novolog and lantus but stopped novolog (?) Compliance- reporting 100% (suspect this may be different) Hypoglycemic symptoms- some dizziness or feeling unwell  All relevant systems were reviewed and were negative unless otherwise noted in the HPI  Past Medical History Patient Active Problem List   Diagnosis Date Noted  . Leukocytosis, unspecified 05/17/2013  . Anemia 05/17/2013  . Lower extremity edema 02/19/2013  . Numbness of feet 03/11/2012  . Abnormal appearance of cervix 08/08/2011  . Back pain 08/07/2011  . GERD (gastroesophageal reflux disease) 04/27/2010  . Type II or unspecified type diabetes mellitus without mention of complication, uncontrolled 04/02/2006  . HYPERLIPIDEMIA 03/08/2006  . OBESITY, NOS 03/08/2006  . HYPERTENSION, BENIGN SYSTEMIC 03/08/2006   Reviewed problem list.  Medications- reviewed and updated Chief complaint-noted No additions to family history Social history- patient is a never smoker  Objective: BP 173/91  Pulse 80  Ht 5\' 3"  (1.6 m)  Wt 228 lb (103.42 kg)  BMI 40.40 kg/m2  LMP 09/18/2011 Gen: NAD, alert, cooperative with exam HEENT: NCAT, EOMI Neck: FROM, supple CV: RRR, good  S1/S2, no murmur, cap refill <3 Ext: 1+ edema bilaterally equal and symmetric , warm, normal tone, moves UE/LE spontaneously Neuro: Alert and oriented, No gross deficits Skin: no rashes no lesions  Assessment/Plan: See problem based a/p

## 2013-08-29 ENCOUNTER — Telehealth: Payer: Self-pay | Admitting: Family Medicine

## 2013-08-29 DIAGNOSIS — D72829 Elevated white blood cell count, unspecified: Secondary | ICD-10-CM

## 2013-08-29 DIAGNOSIS — D6489 Other specified anemias: Secondary | ICD-10-CM

## 2013-08-29 LAB — CBC WITH DIFFERENTIAL/PLATELET
Basophils Absolute: 0 10*3/uL (ref 0.0–0.1)
Basophils Relative: 0 % (ref 0–1)
Eosinophils Absolute: 0.2 10*3/uL (ref 0.0–0.7)
Eosinophils Relative: 1 % (ref 0–5)
HCT: 31.1 % — ABNORMAL LOW (ref 36.0–46.0)
Hemoglobin: 10.5 g/dL — ABNORMAL LOW (ref 12.0–15.0)
Lymphocytes Relative: 25 % (ref 12–46)
Lymphs Abs: 4.4 10*3/uL — ABNORMAL HIGH (ref 0.7–4.0)
MCH: 27.6 pg (ref 26.0–34.0)
MCHC: 33.8 g/dL (ref 30.0–36.0)
MCV: 81.6 fL (ref 78.0–100.0)
Monocytes Absolute: 0.9 10*3/uL (ref 0.1–1.0)
Monocytes Relative: 5 % (ref 3–12)
Neutro Abs: 12.2 10*3/uL — ABNORMAL HIGH (ref 1.7–7.7)
Neutrophils Relative %: 69 % (ref 43–77)
Platelets: 306 10*3/uL (ref 150–400)
RBC: 3.81 MIL/uL — ABNORMAL LOW (ref 3.87–5.11)
RDW: 14.7 % (ref 11.5–15.5)
WBC: 17.7 10*3/uL — ABNORMAL HIGH (ref 4.0–10.5)

## 2013-08-29 NOTE — Telephone Encounter (Signed)
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

## 2013-09-03 ENCOUNTER — Telehealth: Payer: Self-pay | Admitting: Hematology and Oncology

## 2013-09-03 NOTE — Telephone Encounter (Signed)
S/W PATIENT AND GAVE NP APPT FOR 09/03 @ 10:45 W.DR. Alvy Bimler.

## 2013-09-11 ENCOUNTER — Encounter: Payer: Self-pay | Admitting: Hematology and Oncology

## 2013-09-11 ENCOUNTER — Ambulatory Visit (HOSPITAL_BASED_OUTPATIENT_CLINIC_OR_DEPARTMENT_OTHER): Payer: 59

## 2013-09-11 ENCOUNTER — Ambulatory Visit: Payer: 59

## 2013-09-11 ENCOUNTER — Ambulatory Visit (HOSPITAL_COMMUNITY)
Admission: RE | Admit: 2013-09-11 | Discharge: 2013-09-11 | Disposition: A | Discharge status: Home / Self Care | Payer: 59 | Source: Ambulatory Visit | Attending: Hematology and Oncology | Admitting: Hematology and Oncology

## 2013-09-11 ENCOUNTER — Ambulatory Visit (HOSPITAL_BASED_OUTPATIENT_CLINIC_OR_DEPARTMENT_OTHER): Payer: 59 | Admitting: Hematology and Oncology

## 2013-09-11 ENCOUNTER — Telehealth: Payer: Self-pay | Admitting: Hematology and Oncology

## 2013-09-11 VITALS — BP 163/126 | HR 85 | Temp 97.5°F | Resp 20 | Ht 63.0 in | Wt 229.9 lb

## 2013-09-11 DIAGNOSIS — F411 Generalized anxiety disorder: Secondary | ICD-10-CM

## 2013-09-11 DIAGNOSIS — I10 Essential (primary) hypertension: Secondary | ICD-10-CM

## 2013-09-11 DIAGNOSIS — D638 Anemia in other chronic diseases classified elsewhere: Secondary | ICD-10-CM | POA: Diagnosis not present

## 2013-09-11 DIAGNOSIS — D72829 Elevated white blood cell count, unspecified: Secondary | ICD-10-CM

## 2013-09-11 DIAGNOSIS — J9819 Other pulmonary collapse: Secondary | ICD-10-CM | POA: Insufficient documentation

## 2013-09-11 DIAGNOSIS — E1165 Type 2 diabetes mellitus with hyperglycemia: Principal | ICD-10-CM

## 2013-09-11 DIAGNOSIS — IMO0001 Reserved for inherently not codable concepts without codable children: Secondary | ICD-10-CM | POA: Insufficient documentation

## 2013-09-11 DIAGNOSIS — Z23 Encounter for immunization: Secondary | ICD-10-CM

## 2013-09-11 LAB — CBC & DIFF AND RETIC
BASO%: 0.1 % (ref 0.0–2.0)
Basophils Absolute: 0 10*3/uL (ref 0.0–0.1)
EOS%: 1.5 % (ref 0.0–7.0)
Eosinophils Absolute: 0.3 10*3/uL (ref 0.0–0.5)
HCT: 34.7 % — ABNORMAL LOW (ref 34.8–46.6)
HGB: 11.3 g/dL — ABNORMAL LOW (ref 11.6–15.9)
Immature Retic Fract: 7.6 % (ref 1.60–10.00)
LYMPH%: 27.1 % (ref 14.0–49.7)
MCH: 27.2 pg (ref 25.1–34.0)
MCHC: 32.6 g/dL (ref 31.5–36.0)
MCV: 83.6 fL (ref 79.5–101.0)
MONO#: 1 10*3/uL — ABNORMAL HIGH (ref 0.1–0.9)
MONO%: 5.9 % (ref 0.0–14.0)
NEUT#: 11.2 10*3/uL — ABNORMAL HIGH (ref 1.5–6.5)
NEUT%: 65.4 % (ref 38.4–76.8)
Platelets: 299 10*3/uL (ref 145–400)
RBC: 4.15 10*6/uL (ref 3.70–5.45)
RDW: 13.6 % (ref 11.2–14.5)
Retic %: 2.45 % — ABNORMAL HIGH (ref 0.70–2.10)
Retic Ct Abs: 101.68 10*3/uL — ABNORMAL HIGH (ref 33.70–90.70)
WBC: 17.1 10*3/uL — ABNORMAL HIGH (ref 3.9–10.3)
lymph#: 4.6 10*3/uL — ABNORMAL HIGH (ref 0.9–3.3)

## 2013-09-11 LAB — COMPREHENSIVE METABOLIC PANEL (CC13)
ALT: 20 U/L (ref 0–55)
AST: 12 U/L (ref 5–34)
Albumin: 2.8 g/dL — ABNORMAL LOW (ref 3.5–5.0)
Alkaline Phosphatase: 107 U/L (ref 40–150)
Anion Gap: 8 mEq/L (ref 3–11)
BUN: 31.4 mg/dL — ABNORMAL HIGH (ref 7.0–26.0)
CO2: 26 mEq/L (ref 22–29)
Calcium: 9.5 mg/dL (ref 8.4–10.4)
Chloride: 107 mEq/L (ref 98–109)
Creatinine: 1.7 mg/dL — ABNORMAL HIGH (ref 0.6–1.1)
Glucose: 341 mg/dl — ABNORMAL HIGH (ref 70–140)
Potassium: 3.9 mEq/L (ref 3.5–5.1)
Sodium: 141 mEq/L (ref 136–145)
Total Bilirubin: 0.27 mg/dL (ref 0.20–1.20)
Total Protein: 6.7 g/dL (ref 6.4–8.3)

## 2013-09-11 LAB — URINALYSIS, MICROSCOPIC - CHCC
Bilirubin (Urine): NEGATIVE
Glucose: 1000 mg/dL
Ketones: NEGATIVE mg/dL
Leukocyte Esterase: NEGATIVE
Nitrite: NEGATIVE
Protein: 2000 mg/dL
Specific Gravity, Urine: 1.015 (ref 1.003–1.035)
Urobilinogen, UR: 0.2 mg/dL (ref 0.2–1)
pH: 6.5 (ref 4.6–8.0)

## 2013-09-11 LAB — IRON AND TIBC CHCC
%SAT: 20 % — ABNORMAL LOW (ref 21–57)
Iron: 55 ug/dL (ref 41–142)
TIBC: 271 ug/dL (ref 236–444)
UIBC: 216 ug/dL (ref 120–384)

## 2013-09-11 LAB — FERRITIN CHCC: Ferritin: 106 ng/ml (ref 9–269)

## 2013-09-11 LAB — CHCC SMEAR

## 2013-09-11 MED ORDER — INFLUENZA VAC SPLIT QUAD 0.5 ML IM SUSY
0.5000 mL | PREFILLED_SYRINGE | Freq: Once | INTRAMUSCULAR | Status: AC
  Administered 2013-09-11: 0.5 mL via INTRAMUSCULAR
  Filled 2013-09-11: qty 0.5

## 2013-09-11 NOTE — Progress Notes (Signed)
Checked in new pt with no financial concerns.  Pt is here for a hematology concern so no financial assistance is available at this time.

## 2013-09-11 NOTE — Assessment & Plan Note (Signed)
This is likely anemia of chronic disease. The patient denies recent history of bleeding such as epistaxis, hematuria or hematochezia. She is asymptomatic from the anemia. We will observe for now.  I will order additional workup for this.

## 2013-09-11 NOTE — Assessment & Plan Note (Signed)
The cause is unknown, likely reactive in nature. I am wondering whether she may have some undiagnosed chronic infection. I will order bloodwork, urinalysis and chest x-ray.

## 2013-09-11 NOTE — Progress Notes (Signed)
[name]  Patient Care Team: [name], MD as PCP - General (Family Medicine)  CHIEF COMPLAINTS/PURPOSE OF CONSULTATION:  Chronic leukocytosis and anemia  HISTORY OF PRESENTING ILLNESS:  Rachel Chaney 53 y.o. female is here because of elevated WBC and anemia.  She was found to have abnormal CBC from routine blood count monitoring with her primary care provider. I have reviewed her chart extensively. She has fluctuation of white blood cell count since 2012. In fact, I have never seen a normal white blood cell count before. It ranged from 14.6-43. She also had chronic anemia for the last 2 years. She denies recent infection. The last prescription antibiotics was more than 3 months ago There is not reported symptoms of sinus congestion, cough, urinary frequency/urgency or dysuria, diarrhea, joint swelling/pain or abnormal skin rash.  She had no prior history or diagnosis of cancer. Her age appropriate screening programs are up-to-date. The patient has no prior diagnosis of autoimmune disease and was not prescribed corticosteroids related products. She does not smoke. In terms of anemia, the patient denies any recent signs or symptoms of bleeding such as spontaneous epistaxis, hematuria or hematochezia. She eats a regular diet. She has never needed blood transfusions. She denies any symptoms of anemia such as headache, shortness of breath or chest pain.  MEDICAL HISTORY:  Past Medical History  Diagnosis Date  . Diabetes mellitus   . Hyperlipidemia   . Hypertension     SURGICAL HISTORY: Past Surgical History  Procedure Laterality Date  . Eye surgery Right     retinal detachment    SOCIAL HISTORY: History   Social History  . Marital Status: Married    Spouse Name: N/A    Number of Children: N/A  . Years of Education: N/A   Occupational History  . Not on file.   Social History Main Topics  . Smoking status: Never Smoker   . Smokeless  tobacco: Never Used  . Alcohol Use: No  . Drug Use: Not on file  . Sexual Activity: Not on file   Other Topics Concern  . Not on file   Social History Narrative  . No narrative on file    FAMILY HISTORY: Family History  Problem Relation Age of Onset  . Heart disease Mother   . Heart disease Father     ALLERGIES:  has No Known Allergies.  MEDICATIONS:  Current Outpatient Prescriptions  Medication Sig Dispense Refill  . acetaminophen (TYLENOL) 500 MG tablet Take 1,000 mg by mouth every 6 (six) hours as needed for pain.      Marland Kitchen amitriptyline (ELAVIL) 25 MG tablet Take 1 tablet (25 mg total) by mouth at bedtime.  30 tablet  3  . aspirin EC 81 MG tablet Take 81 mg by mouth daily.      Marland Kitchen atorvastatin (LIPITOR) 80 MG tablet Take 1 tablet (80 mg total) by mouth daily.  90 tablet  3  . butalbital-acetaminophen-caffeine (FIORICET) 50-325-40 MG per tablet Take 1 tablet by mouth every 6 (six) hours as needed for headache.  20 tablet  0  . carvedilol (COREG) 25 MG tablet Take 1 tablet (25 mg total) by mouth 2 (two) times daily with a meal.  60 tablet  11  . cetirizine (ZYRTEC) 10 MG tablet Take 10 mg by mouth daily as needed for allergies.      Marland Kitchen enalapril (VASOTEC) 20 MG tablet TAKE 2 TABLETS BY MOUTH DAILY.  60 tablet  11  .  glucose blood (ONE TOUCH ULTRA TEST) test strip Use as instructed  100 each  prn  . hydrochlorothiazide (HYDRODIURIL) 50 MG tablet TAKE 1 TABLET (25 MG TOTAL) BY MOUTH DAILY.  90 tablet  3  . ibuprofen (ADVIL,MOTRIN) 400 MG tablet Take 1 tablet (400 mg total) by mouth every 6 (six) hours as needed.  30 tablet  3  . insulin aspart (NOVOLOG) 100 UNIT/ML injection Inject 7 Units into the skin 3 (three) times daily with meals.  10 mL  11  . insulin glargine (LANTUS) 100 UNIT/ML injection 60 Units qam and 30 units qhs. Increase qhs dose by 3 units for sugars >180, decrease 3 units for sugars <100  2 vial  0  . Insulin Pen Needle 29G X 12MM MISC Inject 1 pen into the skin 2  (two) times daily. Check blood sugar daily.  100 each  6  . omeprazole (PRILOSEC) 20 MG capsule Take 1 capsule (20 mg total) by mouth daily as needed (acid reflux).  90 capsule  0  . ONETOUCH DELICA LANCETS 99991111 MISC 1 Device by Does not apply route as needed (to check blood glucose).  100 each  3  . Syringe, Disposable, 1 ML MISC Use as directed  100 each  prn  . [DISCONTINUED] progesterone (PROMETRIUM) 200 MG capsule Take 1 capsule (200 mg total) by mouth daily. Take for 10 days if bleeding occurs for more than 4 days.  10 capsule  1   Current Facility-Administered Medications  Medication Dose Route Frequency Provider Last Rate Last Dose  . Influenza vac split quadrivalent PF (FLUARIX) injection 0.5 mL  0.5 mL Intramuscular Once Heath Lark, MD        REVIEW OF SYSTEMS:   Constitutional: Denies fevers, chills or abnormal night sweats Eyes: Denies blurriness of vision, double vision or watery eyes Ears, nose, mouth, throat, and face: Denies mucositis or sore throat Respiratory: Denies cough, dyspnea or wheezes Cardiovascular: Denies palpitation, chest discomfort or lower extremity swelling Gastrointestinal:  Denies nausea, heartburn or change in bowel habits Skin: Denies abnormal skin rashes Lymphatics: Denies new lymphadenopathy or easy bruising Neurological: She has chronic peripheral neuropathy from diabetes. Behavioral/Psych: Mood is stable, no new changes  All other systems were reviewed with the patient and are negative.  PHYSICAL EXAMINATION: ECOG PERFORMANCE STATUS: 1 - Symptomatic but completely ambulatory  Filed Vitals:   09/11/13 1052  BP: 163/126  Pulse: 85  Temp: 97.5 F (36.4 C)  Resp: 20   Filed Weights   09/11/13 1052  Weight: 229 lb 14.4 oz (104.282 kg)    GENERAL:alert, no distress and comfortable. She is morbidly obese SKIN: skin color, texture, turgor are normal, no rashes or significant lesions EYES: normal, conjunctiva are pink and non-injected, sclera  clear OROPHARYNX:no exudate, no erythema and lips, buccal mucosa, and tongue normal  NECK: supple, thyroid normal size, non-tender, without nodularity LYMPH:  no palpable lymphadenopathy in the cervical, axillary or inguinal LUNGS: clear to auscultation and percussion with normal breathing effort HEART: regular rate & rhythm and no murmurs and no lower extremity edema ABDOMEN:abdomen soft, non-tender and normal bowel sounds. Unable to appreciate hepatosplenomegaly due to morbid obesity Musculoskeletal:no cyanosis of digits and no clubbing  PSYCH: alert & oriented x 3 with fluent speech NEURO: no focal motor/sensory deficits  LABORATORY DATA:  I have reviewed the data as listed Recent Results (from the past 2160 hour(s))  POCT GLYCOSYLATED HEMOGLOBIN (HGB A1C)     Status: Abnormal   Collection Time  08/28/13  2:00 PM      Result Value Ref Range   Hemoglobin A1C 11.2    CBC WITH DIFFERENTIAL     Status: Abnormal   Collection Time    08/28/13  3:04 PM      Result Value Ref Range   WBC 17.7 (*) 4.0 - 10.5 K/uL   RBC 3.81 (*) 3.87 - 5.11 MIL/uL   Hemoglobin 10.5 (*) 12.0 - 15.0 g/dL   HCT 31.1 (*) 36.0 - 46.0 %   MCV 81.6  78.0 - 100.0 fL   MCH 27.6  26.0 - 34.0 pg   MCHC 33.8  30.0 - 36.0 g/dL   RDW 14.7  11.5 - 15.5 %   Platelets 306  150 - 400 K/uL   Neutrophils Relative % 69  43 - 77 %   Neutro Abs 12.2 (*) 1.7 - 7.7 K/uL   Lymphocytes Relative 25  12 - 46 %   Lymphs Abs 4.4 (*) 0.7 - 4.0 K/uL   Monocytes Relative 5  3 - 12 %   Monocytes Absolute 0.9  0.1 - 1.0 K/uL   Eosinophils Relative 1  0 - 5 %   Eosinophils Absolute 0.2  0.0 - 0.7 K/uL   Basophils Relative 0  0 - 1 %   Basophils Absolute 0.0  0.0 - 0.1 K/uL   Smear Review SEE NOTE     Comment:       RBC, Platelet and WBC Morphology unremarkable    ASSESSMENT & PLAN Leukocytosis, unspecified The cause is unknown, likely reactive in nature. I am wondering whether she may have some undiagnosed chronic infection.  I will order bloodwork, urinalysis and chest x-ray.  Anemia in chronic illness This is likely anemia of chronic disease. The patient denies recent history of bleeding such as epistaxis, hematuria or hematochezia. She is asymptomatic from the anemia. We will observe for now.  I will order additional workup for this.  Essential hypertension Her blood pressure is very high, and the patient attributed that to severe anxiety. Her blood pressure at home and was reportedly to be normal. She will continue her current prescription blood pressure medicine.   We discussed the importance of preventive care and reviewed the vaccination programs. She does not have any prior allergic reactions to influenza vaccination. She agrees to proceed with influenza vaccination today and we will administer it today at the clinic.

## 2013-09-11 NOTE — Assessment & Plan Note (Signed)
Her blood pressure is very high, and the patient attributed that to severe anxiety. Her blood pressure at home and was reportedly to be normal. She will continue her current prescription blood pressure medicine.

## 2013-09-12 LAB — VITAMIN B12: Vitamin B-12: 731 pg/mL (ref 211–911)

## 2013-09-12 LAB — SEDIMENTATION RATE: Sed Rate: 85 mm/hr — ABNORMAL HIGH (ref 0–22)

## 2013-09-24 ENCOUNTER — Ambulatory Visit (INDEPENDENT_AMBULATORY_CARE_PROVIDER_SITE_OTHER): Payer: 59 | Admitting: Family Medicine

## 2013-09-24 ENCOUNTER — Encounter: Payer: Self-pay | Admitting: Family Medicine

## 2013-09-24 VITALS — BP 185/85 | HR 81 | Temp 98.1°F | Wt 228.0 lb

## 2013-09-24 DIAGNOSIS — E1165 Type 2 diabetes mellitus with hyperglycemia: Secondary | ICD-10-CM

## 2013-09-24 DIAGNOSIS — I10 Essential (primary) hypertension: Secondary | ICD-10-CM

## 2013-09-24 DIAGNOSIS — IMO0001 Reserved for inherently not codable concepts without codable children: Secondary | ICD-10-CM

## 2013-09-24 MED ORDER — HYDROCHLOROTHIAZIDE 25 MG PO TABS: 25.0000 mg | ORAL_TABLET | Freq: Every day | ORAL | Status: DC

## 2013-09-24 MED ORDER — HYDRALAZINE HCL 10 MG PO TABS
10.0000 mg | ORAL_TABLET | Freq: Three times a day (TID) | ORAL | Status: DC
Start: 2013-09-24 — End: 2013-10-29

## 2013-09-24 NOTE — Assessment & Plan Note (Signed)
Very high and uncontrolled again Cannot tolerate amlodipine Will add hydralazine 10mg  TID Along with other 3 agents Pt to return for BP check in 2-4 weeks with nursing staff and see me following

## 2013-09-24 NOTE — Patient Instructions (Signed)
Rachel Chaney it was great to see you today!  I am pleased to hear that things are going well for you. For your blood pressure: Coreg 25mg  twice a day, HCTZ 25mg  once a day, Enalapril 20mg X2 (40mg ) once a day, Hydralazine 10mg  three times a day  For your blood sugar I would like you to bring a log when you come back; 1-2 fasting in am and then 1-2 after meals Goal for exercise: 30 minutes most days a week  Looking forward to seeing you soon Make an appointment for 1 month [name], MD

## 2013-09-24 NOTE — Progress Notes (Signed)
Patient ID: Rachel Chaney, female   DOB: 09/06/1960, 53 y.o.   MRN: [id number]   [facility] [name], MD Phone: [phone]  Subjective:  Rachel Chaney is a 53 y.o F who prsents today for f/up on HTN  # HTN -persistently elevated by; Noted last at Dr. Calton Dach office -currrently on HCTZ 50?, enalapril 20mg , coreg 25 BID -also taking ator 80 and asas 81 -stopped amlodipine 2/2 swelling in legs -does not feel sx from high BP other than occasional headache -systolic [id number] at home   #DM -takes 50u lantus morning; 30u night  -CBGs 200s -when go up on lantus CBG drops <90 (65/70)  #overdue HCM -needs colonoscopy -needs eye exam -needs mammogram  All relevant systems were reviewed and were negative unless otherwise noted in the HPI  Past Medical History Reviewed problem list.  Medications- reviewed and updated Current Outpatient Prescriptions  Medication Sig Dispense Refill  . acetaminophen (TYLENOL) 500 MG tablet Take 1,000 mg by mouth every 6 (six) hours as needed for pain.      Marland Kitchen amitriptyline (ELAVIL) 25 MG tablet Take 1 tablet (25 mg total) by mouth at bedtime.  30 tablet  3  . aspirin EC 81 MG tablet Take 81 mg by mouth daily.      Marland Kitchen atorvastatin (LIPITOR) 80 MG tablet Take 1 tablet (80 mg total) by mouth daily.  90 tablet  3  . butalbital-acetaminophen-caffeine (FIORICET) 50-325-40 MG per tablet Take 1 tablet by mouth every 6 (six) hours as needed for headache.  20 tablet  0  . carvedilol (COREG) 25 MG tablet Take 1 tablet (25 mg total) by mouth 2 (two) times daily with a meal.  60 tablet  11  . cetirizine (ZYRTEC) 10 MG tablet Take 10 mg by mouth daily as needed for allergies.      Marland Kitchen enalapril (VASOTEC) 20 MG tablet TAKE 2 TABLETS BY MOUTH DAILY.  60 tablet  11  . glucose blood (ONE TOUCH ULTRA TEST) test strip Use as instructed  100 each  prn  . hydrochlorothiazide (HYDRODIURIL) 50 MG tablet TAKE 1 TABLET (25 MG TOTAL) BY MOUTH DAILY.  90  tablet  3  . ibuprofen (ADVIL,MOTRIN) 400 MG tablet Take 1 tablet (400 mg total) by mouth every 6 (six) hours as needed.  30 tablet  3  . insulin aspart (NOVOLOG) 100 UNIT/ML injection Inject 7 Units into the skin 3 (three) times daily with meals.  10 mL  11  . insulin glargine (LANTUS) 100 UNIT/ML injection 60 Units qam and 30 units qhs. Increase qhs dose by 3 units for sugars >180, decrease 3 units for sugars <100  2 vial  0  . Insulin Pen Needle 29G X 12MM MISC Inject 1 pen into the skin 2 (two) times daily. Check blood sugar daily.  100 each  6  . omeprazole (PRILOSEC) 20 MG capsule Take 1 capsule (20 mg total) by mouth daily as needed (acid reflux).  90 capsule  0  . ONETOUCH DELICA LANCETS 99991111 MISC 1 Device by Does not apply route as needed (to check blood glucose).  100 each  3  . Syringe, Disposable, 1 ML MISC Use as directed  100 each  prn  . [DISCONTINUED] progesterone (PROMETRIUM) 200 MG capsule Take 1 capsule (200 mg total) by mouth daily. Take for 10 days if bleeding occurs for more than 4 days.  10 capsule  1   No current facility-administered medications for this visit.  Chief complaint-noted No additions to family history Social history- patient is a never smoker  Objective: BP 185/85  Pulse 81  Temp(Src) 98.1 F (36.7 C) (Oral)  Wt 228 lb (103.42 kg)  LMP 09/18/2011 Gen: NAD, alert, cooperative with exam CV: RR Ext: No edema, warm, normal tone, moves UE/LE spontaneously Neuro: Alert and oriented, No gross deficits Skin: no rashes no lesions  Assessment/Plan: See problem based a/p

## 2013-09-24 NOTE — Assessment & Plan Note (Signed)
Extremely uncontrolled A1C 11 Limiting pt from getting elective surgery Lantus 50/30 right now Am limited with increasing 2/2 low CBG fasting Pt declines novolog and victoza here today Will work again towards diet and exercise control Very concerning to me Needs eye exam

## 2013-09-26 ENCOUNTER — Telehealth: Payer: Self-pay | Admitting: Hematology and Oncology

## 2013-09-26 NOTE — Telephone Encounter (Signed)
pt called to r/s appt....done per pt requested 1st afternoon on a Monday....pt ok and aware of new d.t

## 2013-09-29 ENCOUNTER — Ambulatory Visit: Payer: 59 | Admitting: Hematology and Oncology

## 2013-10-13 ENCOUNTER — Encounter: Payer: Self-pay | Admitting: Hematology and Oncology

## 2013-10-13 ENCOUNTER — Ambulatory Visit (HOSPITAL_BASED_OUTPATIENT_CLINIC_OR_DEPARTMENT_OTHER): Payer: 59 | Admitting: Hematology and Oncology

## 2013-10-13 VITALS — BP 236/113 | HR 98 | Temp 98.4°F | Resp 18 | Ht 63.0 in | Wt 232.7 lb

## 2013-10-13 DIAGNOSIS — E1122 Type 2 diabetes mellitus with diabetic chronic kidney disease: Secondary | ICD-10-CM | POA: Diagnosis not present

## 2013-10-13 DIAGNOSIS — E1140 Type 2 diabetes mellitus with diabetic neuropathy, unspecified: Secondary | ICD-10-CM

## 2013-10-13 DIAGNOSIS — N189 Chronic kidney disease, unspecified: Secondary | ICD-10-CM | POA: Diagnosis not present

## 2013-10-13 DIAGNOSIS — D72829 Elevated white blood cell count, unspecified: Secondary | ICD-10-CM

## 2013-10-13 DIAGNOSIS — D638 Anemia in other chronic diseases classified elsewhere: Secondary | ICD-10-CM | POA: Diagnosis not present

## 2013-10-13 HISTORY — DX: Type 2 diabetes mellitus with diabetic chronic kidney disease: E11.22

## 2013-10-13 NOTE — Assessment & Plan Note (Signed)
Her diabetes is poorly controlled. I would defer to her primary care provider and endocrinologist for management.

## 2013-10-13 NOTE — Assessment & Plan Note (Signed)
She has signs of diabetic neuropathy and nephropathy. I reinforced the importance of tight diabetes control. She is currently taking lisinopril.

## 2013-10-13 NOTE — Progress Notes (Signed)
[facility] OFFICE PROGRESS NOTE  Langston Masker, MD SUMMARY OF HEMATOLOGIC HISTORY: She was found to have abnormal CBC from routine blood count monitoring with her primary care provider. I have reviewed her chart extensively. She has fluctuation of white blood cell count since 2012. In fact, I have never seen a normal white blood cell count before. It ranged from 14.6-43. She also had chronic anemia for the last 2 years. She denies recent infection. The last prescription antibiotics was more than 3 months ago There is not reported symptoms of sinus congestion, cough, urinary frequency/urgency or dysuria, diarrhea, joint swelling/pain or abnormal skin rash.  She had no prior history or diagnosis of cancer. Her age appropriate screening programs are up-to-date. The patient has no prior diagnosis of autoimmune disease and was not prescribed corticosteroids related products. She does not smoke. In terms of anemia, the patient denies any recent signs or symptoms of bleeding such as spontaneous epistaxis, hematuria or hematochezia. She eats a regular diet. She has never needed blood transfusions. She denies any symptoms of anemia such as headache, shortness of breath or chest pain. INTERVAL HISTORY: Early Chars 53 y.o. female returns for further followup. She has no new complaints.  I have reviewed the past medical history, past surgical history, social history and family history with the patient and they are unchanged from previous note.  ALLERGIES:  has No Known Allergies.  MEDICATIONS:  Current Outpatient Prescriptions  Medication Sig Dispense Refill  . acetaminophen (TYLENOL) 500 MG tablet Take 1,000 mg by mouth every 6 (six) hours as needed for pain.      Marland Kitchen amitriptyline (ELAVIL) 25 MG tablet Take 1 tablet (25 mg total) by mouth at bedtime.  30 tablet  3  . aspirin EC 81 MG tablet Take 81 mg by mouth daily.      Marland Kitchen atorvastatin (LIPITOR) 80 MG tablet Take 1 tablet (80 mg  total) by mouth daily.  90 tablet  3  . butalbital-acetaminophen-caffeine (FIORICET) 50-325-40 MG per tablet Take 1 tablet by mouth every 6 (six) hours as needed for headache.  20 tablet  0  . carvedilol (COREG) 25 MG tablet Take 1 tablet (25 mg total) by mouth 2 (two) times daily with a meal.  60 tablet  11  . cetirizine (ZYRTEC) 10 MG tablet Take 10 mg by mouth daily as needed for allergies.      Marland Kitchen enalapril (VASOTEC) 20 MG tablet TAKE 2 TABLETS BY MOUTH DAILY.  60 tablet  11  . glucose blood (ONE TOUCH ULTRA TEST) test strip Use as instructed  100 each  prn  . hydrALAZINE (APRESOLINE) 10 MG tablet Take 1 tablet (10 mg total) by mouth 3 (three) times daily.  90 tablet  0  . hydrochlorothiazide (HYDRODIURIL) 25 MG tablet Take 1 tablet (25 mg total) by mouth daily.  90 tablet  3  . ibuprofen (ADVIL,MOTRIN) 400 MG tablet Take 1 tablet (400 mg total) by mouth every 6 (six) hours as needed.  30 tablet  3  . insulin aspart (NOVOLOG) 100 UNIT/ML injection Inject 7 Units into the skin 3 (three) times daily with meals.  10 mL  11  . insulin glargine (LANTUS) 100 UNIT/ML injection 60 Units qam and 30 units qhs. Increase qhs dose by 3 units for sugars >180, decrease 3 units for sugars <100  2 vial  0  . Insulin Pen Needle 29G X 12MM MISC Inject 1 pen into the skin 2 (two) times daily. Check  blood sugar daily.  100 each  6  . omeprazole (PRILOSEC) 20 MG capsule Take 1 capsule (20 mg total) by mouth daily as needed (acid reflux).  90 capsule  0  . ONETOUCH DELICA LANCETS 99991111 MISC 1 Device by Does not apply route as needed (to check blood glucose).  100 each  3  . Syringe, Disposable, 1 ML MISC Use as directed  100 each  prn  . [DISCONTINUED] progesterone (PROMETRIUM) 200 MG capsule Take 1 capsule (200 mg total) by mouth daily. Take for 10 days if bleeding occurs for more than 4 days.  10 capsule  1   No current facility-administered medications for this visit.     REVIEW OF SYSTEMS:   Constitutional:  Denies fevers, chills or night sweats Eyes: Denies blurriness of vision Ears, nose, mouth, throat, and face: Denies mucositis or sore throat Respiratory: Denies cough, dyspnea or wheezes Cardiovascular: Denies palpitation, chest discomfort or lower extremity swelling Gastrointestinal:  Denies nausea, heartburn or change in bowel habits Skin: Denies abnormal skin rashes Lymphatics: Denies new lymphadenopathy or easy bruising Neurological:Denies numbness, tingling or new weaknesses Behavioral/Psych: Mood is stable, no new changes  All other systems were reviewed with the patient and are negative.  PHYSICAL EXAMINATION: ECOG PERFORMANCE STATUS: 0 - Asymptomatic  Filed Vitals:   10/13/13 1402  BP: 236/113  Pulse: 98  Temp: 98.4 F (36.9 C)  Resp: 18   Filed Weights   10/13/13 1402  Weight: 232 lb 11.2 oz (105.552 kg)    GENERAL:alert, no distress and comfortable. She is morbidly obese SKIN: skin color, texture, turgor are normal, no rashes or significant lesions EYES: normal, Conjunctiva are pink and non-injected, sclera clear NEURO: alert & oriented x 3 with fluent speech, no focal motor/sensory deficits  LABORATORY DATA:  I have reviewed the data as listed No results found for this or any previous visit (from the past 48 hour(s)).  Lab Results  Component Value Date   WBC 17.1* 09/11/2013   HGB 11.3* 09/11/2013   HCT 34.7* 09/11/2013   MCV 83.6 09/11/2013   PLT 299 09/11/2013    RADIOGRAPHIC STUDIES: I reviewed the chest x-ray I have personally reviewed the radiological images as listed and agreed with the findings in the report. ASSESSMENT & PLAN:  Leukocytosis The cause is unknown, likely reactive in nature. She has chronic, poorly controlled diabetes with a high elevated sedimentation rate. Urinalysis showed no signs of active infection. Chest x-ray was negative. I recommend tight diabetes control and recheck again in 8 months. Her blood work does not suggest any signs of  myeloproliferative disorder for now.    Diabetes mellitus with neuropathy Her diabetes is poorly controlled. I would defer to her primary care provider and endocrinologist for management.  Anemia in chronic illness This is likely anemia of chronic disease. The patient denies recent history of bleeding such as epistaxis, hematuria or hematochezia. She is asymptomatic from the anemia. We will observe for now.  She does not require transfusion now.    Chronic kidney disease due to diabetes mellitus She has signs of diabetic neuropathy and nephropathy. I reinforced the importance of tight diabetes control. She is currently taking lisinopril.    All questions were answered. The patient knows to call the clinic with any problems, questions or concerns. No barriers to learning was detected.  I spent 15 minutes counseling the patient face to face. The total time spent in the appointment was 20 minutes and more than 50% was on  counseling.     [facility], [place], MD 10/13/2013 4:33 PM

## 2013-10-13 NOTE — Assessment & Plan Note (Signed)
This is likely anemia of chronic disease. The patient denies recent history of bleeding such as epistaxis, hematuria or hematochezia. She is asymptomatic from the anemia. We will observe for now.  She does not require transfusion now.   

## 2013-10-13 NOTE — Assessment & Plan Note (Addendum)
The cause is unknown, likely reactive in nature. She has chronic, poorly controlled diabetes with a high elevated sedimentation rate. Urinalysis showed no signs of active infection. Chest x-ray was negative. I recommend tight diabetes control and recheck again in 8 months. Her blood work does not suggest any signs of myeloproliferative disorder for now.

## 2013-10-16 ENCOUNTER — Telehealth: Payer: Self-pay | Admitting: Hematology and Oncology

## 2013-10-16 NOTE — Telephone Encounter (Signed)
s.w pt and advised on June 2016 appt...mailed pt appt sched/avs and letter °

## 2013-10-29 ENCOUNTER — Other Ambulatory Visit: Payer: Self-pay | Admitting: Family Medicine

## 2013-11-12 ENCOUNTER — Ambulatory Visit (INDEPENDENT_AMBULATORY_CARE_PROVIDER_SITE_OTHER): Payer: 59 | Admitting: Family Medicine

## 2013-11-12 ENCOUNTER — Encounter: Payer: Self-pay | Admitting: Family Medicine

## 2013-11-12 VITALS — BP 188/99 | HR 92 | Temp 97.8°F | Ht 63.0 in | Wt 233.0 lb

## 2013-11-12 DIAGNOSIS — I10 Essential (primary) hypertension: Secondary | ICD-10-CM

## 2013-11-12 MED ORDER — HYDRALAZINE HCL 25 MG PO TABS: 25.0000 mg | ORAL_TABLET | Freq: Three times a day (TID) | ORAL | Status: DC

## 2013-11-12 NOTE — Patient Instructions (Signed)
Ms Werking it was great to see you today!  For now take 2 pills of hydralazine 10mg  3 times a day- for a total of 60mg  Eventually you will pick up prescription of 25mg  3 times a day- for a total of 75mg   Please make appointment to see the nutritionist   Please return to clinic if symptoms do not improve or worsen Feel better soon [name], MD  DASH Eating Plan Ray stands for "Dietary Approaches to Stop Hypertension." The DASH eating plan is a healthy eating plan that has been shown to reduce high blood pressure (hypertension). Additional health benefits may include reducing the risk of type 2 diabetes mellitus, heart disease, and stroke. The DASH eating plan may also help with weight loss. WHAT DO I NEED TO KNOW ABOUT THE DASH EATING PLAN? For the DASH eating plan, you will follow these general guidelines:  Choose foods with a percent daily value for sodium of less than 5% (as listed on the food label).  Use salt-free seasonings or herbs instead of table salt or sea salt.  Check with your health care provider or pharmacist before using salt substitutes.  Eat lower-sodium products, often labeled as "lower sodium" or "no salt added."  Eat fresh foods.  Eat more vegetables, fruits, and low-fat dairy products.  Choose whole grains. Look for the word "whole" as the first word in the ingredient list.  Choose fish and skinless chicken or [place] more often than red meat. Limit fish, poultry, and meat to 6 oz (170 g) each day.  Limit sweets, desserts, sugars, and sugary drinks.  Choose heart-healthy fats.  Limit cheese to 1 oz (28 g) per day.  Eat more home-cooked food and less restaurant, buffet, and fast food.  Limit fried foods.  Cook foods using methods other than frying.  Limit canned vegetables. If you do use them, rinse them well to decrease the sodium.  When eating at a restaurant, ask that your food be prepared with less salt, or no salt if possible. WHAT FOODS  CAN I EAT? Seek help from a dietitian for individual calorie needs. Grains Whole grain or whole wheat bread. Brown rice. Whole grain or whole wheat pasta. Quinoa, bulgur, and whole grain cereals. Low-sodium cereals. Corn or whole wheat flour tortillas. Whole grain cornbread. Whole grain crackers. Low-sodium crackers. Vegetables Fresh or frozen vegetables (raw, steamed, roasted, or grilled). Low-sodium or reduced-sodium tomato and vegetable juices. Low-sodium or reduced-sodium tomato sauce and paste. Low-sodium or reduced-sodium canned vegetables.  Fruits All fresh, canned (in natural juice), or frozen fruits. Meat and Other Protein Products Ground beef (85% or leaner), grass-fed beef, or beef trimmed of fat. Skinless chicken or [place]. Ground chicken or [place]. Pork trimmed of fat. All fish and seafood. Eggs. Dried beans, peas, or lentils. Unsalted nuts and seeds. Unsalted canned beans. Dairy Low-fat dairy products, such as skim or 1% milk, 2% or reduced-fat cheeses, low-fat ricotta or cottage cheese, or plain low-fat yogurt. Low-sodium or reduced-sodium cheeses. Fats and Oils Tub margarines without trans fats. Light or reduced-fat mayonnaise and salad dressings (reduced sodium). Avocado. Safflower, olive, or canola oils. Natural peanut or almond butter. Other Unsalted popcorn and pretzels. The items listed above may not be a complete list of recommended foods or beverages. Contact your dietitian for more options. WHAT FOODS ARE NOT RECOMMENDED? Grains White bread. White pasta. White rice. Refined cornbread. Bagels and croissants. Crackers that contain trans fat. Vegetables Creamed or fried vegetables. Vegetables in a cheese sauce. Regular  canned vegetables. Regular canned tomato sauce and paste. Regular tomato and vegetable juices. Fruits Dried fruits. Canned fruit in light or heavy syrup. Fruit juice. Meat and Other Protein Products Fatty cuts of meat. Ribs, chicken wings, bacon, sausage,  bologna, salami, chitterlings, fatback, hot dogs, bratwurst, and packaged luncheon meats. Salted nuts and seeds. Canned beans with salt. Dairy Whole or 2% milk, cream, half-and-half, and cream cheese. Whole-fat or sweetened yogurt. Full-fat cheeses or blue cheese. Nondairy creamers and whipped toppings. Processed cheese, cheese spreads, or cheese curds. Condiments Onion and garlic salt, seasoned salt, table salt, and sea salt. Canned and packaged gravies. Worcestershire sauce. Tartar sauce. Barbecue sauce. Teriyaki sauce. Soy sauce, including reduced sodium. Steak sauce. Fish sauce. Oyster sauce. Cocktail sauce. Horseradish. Ketchup and mustard. Meat flavorings and tenderizers. Bouillon cubes. Hot sauce. Tabasco sauce. Marinades. Taco seasonings. Relishes. Fats and Oils Butter, stick margarine, lard, shortening, ghee, and bacon fat. Coconut, palm kernel, or palm oils. Regular salad dressings. Other Pickles and olives. Salted popcorn and pretzels. The items listed above may not be a complete list of foods and beverages to avoid. Contact your dietitian for more information. WHERE CAN I FIND MORE INFORMATION? National Heart, [employer], and [employer] Institute: travelstabloid.com Document Released: 12/15/2010 Document Revised: 05/12/2013 Document Reviewed: 10/30/2012 [facility] Patient Information 2015 [place], [place]. This information is not intended to replace advice given to you by your health care provider. Make sure you discuss any questions you have with your health care provider.

## 2013-11-12 NOTE — Assessment & Plan Note (Signed)
Unfortunately persistently uncontrolled Suspect component of non-compliance (pt also did not understand TID dosing of medication last visit) Reinstructed hydralazine TID but increase to 2 pills of 10mg  at each dose until she runs out Then she will switch to 25mg  TID  Re-inforced need to follow DASH diet Referral placed to Dr. Jenne Campus RTC in 1 month (following this visit)

## 2013-11-12 NOTE — Progress Notes (Signed)
Patient ID: Rachel Chaney, female   DOB: 1960-11-21, 53 y.o.   MRN: [id number]   [facility] [name], MD Phone: [phone]  Subjective:  Rachel Chaney is a 53 y.o F who presents today for f/up on HTN  # HTN -persistently elevated, made decision to add hydralazine 10mg  TID in addition to other 3 agents (was only taking twice) at last visit  -currrently on HCTZ 25, enalapril 20mg  BID, coreg 25 BID in addition -also taking ator 80 and asas 81 -stopped amlodipine 2/2 swelling in legs -does not feel sx from high BP other than occasional headache (but not lately), no SOB, no chest pain -systolic at home [id number] -occasionally will miss doses at night time  All relevant systems were reviewed and were negative unless otherwise noted in the HPI  Past Medical History Reviewed problem list.  Medications- reviewed and updated Current Outpatient Prescriptions  Medication Sig Dispense Refill  . acetaminophen (TYLENOL) 500 MG tablet Take 1,000 mg by mouth every 6 (six) hours as needed for pain.    Marland Kitchen amitriptyline (ELAVIL) 25 MG tablet Take 1 tablet (25 mg total) by mouth at bedtime. 30 tablet 3  . aspirin EC 81 MG tablet Take 81 mg by mouth daily.    Marland Kitchen atorvastatin (LIPITOR) 80 MG tablet Take 1 tablet (80 mg total) by mouth daily. 90 tablet 3  . butalbital-acetaminophen-caffeine (FIORICET) 50-325-40 MG per tablet Take 1 tablet by mouth every 6 (six) hours as needed for headache. 20 tablet 0  . carvedilol (COREG) 25 MG tablet Take 1 tablet (25 mg total) by mouth 2 (two) times daily with a meal. 60 tablet 11  . cetirizine (ZYRTEC) 10 MG tablet Take 10 mg by mouth daily as needed for allergies.    Marland Kitchen enalapril (VASOTEC) 20 MG tablet TAKE 2 TABLETS BY MOUTH DAILY. 60 tablet 11  . glucose blood (ONE TOUCH ULTRA TEST) test strip Use as instructed 100 each prn  . hydrALAZINE (APRESOLINE) 10 MG tablet TAKE ONE TABLET BY MOUTH THREE TIMES DAILY 90 tablet 0  . hydrochlorothiazide  (HYDRODIURIL) 25 MG tablet Take 1 tablet (25 mg total) by mouth daily. 90 tablet 3  . ibuprofen (ADVIL,MOTRIN) 400 MG tablet Take 1 tablet (400 mg total) by mouth every 6 (six) hours as needed. 30 tablet 3  . insulin aspart (NOVOLOG) 100 UNIT/ML injection Inject 7 Units into the skin 3 (three) times daily with meals. 10 mL 11  . insulin glargine (LANTUS) 100 UNIT/ML injection 60 Units qam and 30 units qhs. Increase qhs dose by 3 units for sugars >180, decrease 3 units for sugars <100 2 vial 0  . Insulin Pen Needle 29G X 12MM MISC Inject 1 pen into the skin 2 (two) times daily. Check blood sugar daily. 100 each 6  . omeprazole (PRILOSEC) 20 MG capsule Take 1 capsule (20 mg total) by mouth daily as needed (acid reflux). 90 capsule 0  . ONETOUCH DELICA LANCETS 99991111 MISC 1 Device by Does not apply route as needed (to check blood glucose). 100 each 3  . Syringe, Disposable, 1 ML MISC Use as directed 100 each prn  . [DISCONTINUED] progesterone (PROMETRIUM) 200 MG capsule Take 1 capsule (200 mg total) by mouth daily. Take for 10 days if bleeding occurs for more than 4 days. 10 capsule 1   No current facility-administered medications for this visit.   Chief complaint-noted No additions to family history Social history- patient is a never smoker  Objective:  BP 188/99 mmHg  Pulse 92  Temp(Src) 97.8 F (36.6 C) (Oral)  Ht 5\' 3"  (1.6 m)  Wt 233 lb (105.688 kg)  BMI 41.28 kg/m2  LMP 09/18/2011 Gen: NAD, alert, cooperative with exam CV: tachycardic, RR Ext: No edema, warm, normal tone, moves UE/LE spontaneously Neuro: Alert and oriented, No gross deficits Skin: no rashes no lesions  Assessment/Plan: See problem based a/p

## 2013-12-01 ENCOUNTER — Other Ambulatory Visit: Payer: Self-pay | Admitting: Family Medicine

## 2013-12-09 ENCOUNTER — Other Ambulatory Visit: Payer: Self-pay | Admitting: *Deleted

## 2013-12-09 MED ORDER — OMEPRAZOLE 20 MG PO CPDR: 20.0000 mg | DELAYED_RELEASE_CAPSULE | Freq: Every day | ORAL | Status: DC | PRN

## 2013-12-15 ENCOUNTER — Other Ambulatory Visit: Payer: Self-pay | Admitting: *Deleted

## 2013-12-16 MED ORDER — CARVEDILOL 25 MG PO TABS: 25.0000 mg | ORAL_TABLET | Freq: Two times a day (BID) | ORAL | Status: DC

## 2013-12-31 ENCOUNTER — Telehealth: Payer: Self-pay | Admitting: *Deleted

## 2013-12-31 MED ORDER — INSULIN GLARGINE 100 UNIT/ML SOLOSTAR PEN: PEN_INJECTOR | SUBCUTANEOUS | Status: DC

## 2013-12-31 NOTE — Telephone Encounter (Signed)
LM for patient to call back.  Please give message from MD regarding need for a follow up appt.  Thanks [employer]

## 2013-12-31 NOTE — Telephone Encounter (Signed)
Fax request for lantus solostar inj, unable to find in patient meds.

## 2013-12-31 NOTE — Telephone Encounter (Signed)
Lantus solostar pen sent in. 50 units in AM and 30 units in PM per last note with Dr. Skeet Simmer in September. Pt should get in to be seen for follow up appointment for diabetes after the holidays. Please call and let her know this, thank you. -Dr. Lamar Benes

## 2013-12-31 NOTE — Telephone Encounter (Signed)
Pt called because she asked the pharmacy to request Solostar because she used to take it and now she has a coupon to get it for 25.00 and she would like to take that one since its cheaper. jw

## 2013-12-31 NOTE — Telephone Encounter (Signed)
Will forward to Md to change medication. Jennette Leask,CMA

## 2014-01-07 ENCOUNTER — Other Ambulatory Visit: Payer: Self-pay | Admitting: Family Medicine

## 2014-01-07 ENCOUNTER — Other Ambulatory Visit: Payer: Self-pay | Admitting: *Deleted

## 2014-01-07 MED ORDER — INSULIN PEN NEEDLE 31G X 5 MM MISC: Status: DC

## 2014-02-12 ENCOUNTER — Other Ambulatory Visit: Payer: Self-pay | Admitting: Family Medicine

## 2014-02-12 DIAGNOSIS — I10 Essential (primary) hypertension: Secondary | ICD-10-CM

## 2014-02-12 MED ORDER — HYDRALAZINE HCL 25 MG PO TABS: 25.0000 mg | ORAL_TABLET | Freq: Three times a day (TID) | ORAL | Status: DC

## 2014-02-12 NOTE — Telephone Encounter (Signed)
Pt called and needs a refill on her Hydralazine. Can we also do more than the 30 qty since she is taking these 3 times a day it doe not last the whole month. jw

## 2014-03-02 ENCOUNTER — Ambulatory Visit (INDEPENDENT_AMBULATORY_CARE_PROVIDER_SITE_OTHER): Payer: 59 | Admitting: Family Medicine

## 2014-03-02 ENCOUNTER — Encounter: Payer: Self-pay | Admitting: Family Medicine

## 2014-03-02 VITALS — BP 154/82 | HR 82 | Temp 98.6°F | Ht 63.0 in | Wt 238.0 lb

## 2014-03-02 DIAGNOSIS — E1140 Type 2 diabetes mellitus with diabetic neuropathy, unspecified: Secondary | ICD-10-CM

## 2014-03-02 DIAGNOSIS — Z0000 Encounter for general adult medical examination without abnormal findings: Secondary | ICD-10-CM

## 2014-03-02 DIAGNOSIS — I10 Essential (primary) hypertension: Secondary | ICD-10-CM

## 2014-03-02 DIAGNOSIS — Z7189 Other specified counseling: Secondary | ICD-10-CM | POA: Insufficient documentation

## 2014-03-02 LAB — POCT GLYCOSYLATED HEMOGLOBIN (HGB A1C): Hemoglobin A1C: 10.6

## 2014-03-02 LAB — GLUCOSE, CAPILLARY: Glucose-Capillary: 96 mg/dL (ref 70–99)

## 2014-03-02 MED ORDER — HYDRALAZINE HCL 25 MG PO TABS: 25.0000 mg | ORAL_TABLET | Freq: Four times a day (QID) | ORAL | Status: DC

## 2014-03-02 NOTE — Patient Instructions (Signed)
Thank you for coming to the clinic today. It was nice seeing you.  Your blood sugar was still elevated today. Your A1c was 10.6. We recommended that you start a new medication called Januvia, however you declined. If you would like to talk about this medication further, we would be more than happy to do so. Please keep a good record of your blood sugars and bring them to your next visit.  For your blood pressure, it was slightly elevated to 154/82 today. We will increase your hydralazine to 4 times a day.  Please come to the clinic sometime within the next week for a blood pressure recheck.

## 2014-03-02 NOTE — Assessment & Plan Note (Signed)
BP slightly above goal to 154/82 today. Has been tolerating addition of hydralazine since last visit with no side effects. Will increase hydralazine to qid dosing today.   Follow up in 1-2 weeks.

## 2014-03-02 NOTE — Assessment & Plan Note (Addendum)
Due for colonoscopy, pneumonia vaccine, and mammogram. Did not discuss today due to time constraints. Will need to be addressed at future appointments.   Patient will be following up with ophthalmology for eye exam.

## 2014-03-02 NOTE — Assessment & Plan Note (Addendum)
A1c still significantly elevated to 10.6 today, though POCT CBG wnl.  Patient reports strict adherence to lantus regimen and reports sugars in the 140-180 range, which is inconsistent with her A1C. Based on prior notes, there is some concern about her adherence to her insulin regimen. Will not increase Lantus today due to for concern for inducing hypoglycemia (especially given her history of symptomatic lows). Instructed patient to keep a strict log of her blood sugars and bring them to her next appointment. May consider adding mealtime novolog back to her regimen pending CBG log.   Metformin is not an option given her CKD. Recommended starting Januvia today, however patient declined. Patient stated that she was going to work harder on her diet and exercise and did not feel like she needed to start a new medication today. Told patient that lifestyle modifications were unlikely to bring her sugars to goal levels, and that she could benefit from starting a new agent, however she still declined.

## 2014-03-02 NOTE — Progress Notes (Signed)
Rachel Chaney is a 54 y.o. female who presents to the [facility] today with a chief complaint of HTN and DM follow up. Her concerns today include:  HPI:  Hypertension Patient started hydralazine 25mg  tid at her last appointment. Says that she is handling this and her other blood pressure medications well without significant side effects. Endorses good compliance. BP has been checked at home by daughter - reports that it is usually in the 140s/80s-90s.   BP Readings from Last 3 Encounters:  03/02/14 154/82  11/12/13 188/99  10/13/13 236/113   ROS-Denies any CP, HA, SOB, blurry vision, LE edema, transient weakness, orthopnea, PND.   DIABETES Type II Currently only on Lantus 50U in the morning and 30 units at night. Endorses good adherence to regimen. Reports that her sugars are usually 160s-180s in the morning, around 140 in the middle of the day. Says that she usually only checks her blood sugar in the morning. Did have a low of 80 one morning, with associated light headedness and dizziness. Patient drank some juice which alleviated her symptoms. Patient states that she is trying to be more active and eat more healthy.   Hemoglobin a1c:  Lab Results  Component Value Date   HGBA1C 11.2 08/28/2013   HGBA1C 7.7 04/19/2013   HGBA1C 8.1 02/19/2013   HCM Health Maintenance Due  Topic Date Due  . COLONOSCOPY  11/16/2008  . PNEUMOCOCCAL POLYSACCHARIDE VACCINE (2) 11/09/2009  . FOOT EXAM  04/09/2011  . OPHTHALMOLOGY EXAM  04/09/2011  . MAMMOGRAM  04/08/2012  . HEMOGLOBIN A1C  02/28/2014   ROS: As per HPI, otherwise all systems reviewed and are negative.  Past Medical History - Reviewed and updated Patient Active Problem List   Diagnosis Date Noted  . Healthcare maintenance 03/02/2014  . Chronic kidney disease due to diabetes mellitus 10/13/2013  . Leukocytosis 05/17/2013  . Anemia in chronic illness 05/17/2013  . Lower extremity edema 02/19/2013  . Numbness of feet 03/11/2012  .  Abnormal appearance of cervix 08/08/2011  . Back pain 08/07/2011  . GERD (gastroesophageal reflux disease) 04/27/2010  . Diabetes mellitus with neuropathy 04/02/2006  . HYPERLIPIDEMIA 03/08/2006  . OBESITY, NOS 03/08/2006  . Essential hypertension 03/08/2006    Medications- reviewed and updated Current Outpatient Prescriptions  Medication Sig Dispense Refill  . acetaminophen (TYLENOL) 500 MG tablet Take 1,000 mg by mouth every 6 (six) hours as needed for pain.    Marland Kitchen amitriptyline (ELAVIL) 25 MG tablet Take 1 tablet (25 mg total) by mouth at bedtime. 30 tablet 3  . aspirin EC 81 MG tablet Take 81 mg by mouth daily.    Marland Kitchen atorvastatin (LIPITOR) 80 MG tablet Take 1 tablet (80 mg total) by mouth daily. 90 tablet 3  . butalbital-acetaminophen-caffeine (FIORICET) 50-325-40 MG per tablet Take 1 tablet by mouth every 6 (six) hours as needed for headache. 20 tablet 0  . carvedilol (COREG) 25 MG tablet Take 1 tablet (25 mg total) by mouth 2 (two) times daily with a meal. 60 tablet 11  . cetirizine (ZYRTEC) 10 MG tablet Take 10 mg by mouth daily as needed for allergies.    Marland Kitchen enalapril (VASOTEC) 20 MG tablet TAKE 2 TABLETS BY MOUTH DAILY. 60 tablet 11  . glucose blood (ONE TOUCH ULTRA TEST) test strip Use as instructed 100 each prn  . hydrALAZINE (APRESOLINE) 25 MG tablet Take 1 tablet (25 mg total) by mouth 4 (four) times daily. 180 tablet 1  . hydrochlorothiazide (HYDRODIURIL) 25 MG tablet  Take 1 tablet (25 mg total) by mouth daily. 90 tablet 3  . ibuprofen (ADVIL,MOTRIN) 400 MG tablet Take 1 tablet (400 mg total) by mouth every 6 (six) hours as needed. 30 tablet 3  . Insulin Glargine (LANTUS SOLOSTAR) 100 UNIT/ML Solostar Pen 50 units in AM, 30 units in PM. 15 mL 3  . Insulin Pen Needle 29G X 12MM MISC Inject 1 pen into the skin 2 (two) times daily. Check blood sugar daily. 100 each 6  . Insulin Pen Needle 31G X 5 MM MISC Use as directed for insulin injection bid 200 each 3  . omeprazole (PRILOSEC)  20 MG capsule Take 1 capsule (20 mg total) by mouth daily as needed (acid reflux). 90 capsule 0  . ONETOUCH DELICA LANCETS [id number] MISC 1 Device by Does not apply route as needed (to check blood glucose). 100 each 3  . Syringe, Disposable, 1 ML MISC Use as directed 100 each prn  . [DISCONTINUED] progesterone (PROMETRIUM) 200 MG capsule Take 1 capsule (200 mg total) by mouth daily. Take for 10 days if bleeding occurs for more than 4 days. 10 capsule 1   No current facility-administered medications for this visit.    Objective: Physical Exam: BP 154/82 mmHg  Pulse 82  Temp(Src) 98.6 F (37 C) (Oral)  Ht 5\' 3"  (1.6 m)  Wt 238 lb (107.956 kg)  BMI 42.17 kg/m2  LMP 09/18/2011  Gen: NAD, obese female resting comfortably, mildly anxious appearing CV: RRR with no murmurs appreciated Lungs: NWOB, CTAB with no crackles, wheezes, or rhonchi Abdomen: Normal bowel sounds present. Soft, Nontender, Nondistended. Ext: no edema Skin: warm, dry Neuro: grossly normal, moves all extremities  Results for orders placed or performed in visit on 03/02/14 (from the past 72 hour(s))  POCT A1C     Status: Abnormal   Collection Time: 03/02/14  8:45 AM  Result Value Ref Range   Hemoglobin A1C 10.6   Glucose, capillary     Status: None   Collection Time: 03/02/14  9:35 AM  Result Value Ref Range   Glucose-Capillary 96 70 - 99 mg/dL    A/P: See problem list  Diabetes mellitus with neuropathy A1c still significantly elevated to 10.6 today, though POCT CBG wnl.  Patient reports strict adherence to lantus regimen and reports sugars in the 140-180 range, which is inconsistent with her A1C. Based on prior notes, there is some concern about her adherence to her insulin regimen. Will not increase Lantus today due to for concern for inducing hypoglycemia (especially given her history of symptomatic lows). Instructed patient to keep a strict log of her blood sugars and bring them to her next appointment. May consider  adding mealtime novolog back to her regimen pending CBG log.   Metformin is not an option given her CKD. Recommended starting Januvia today, however patient declined. Patient stated that she was going to work harder on her diet and exercise and did not feel like she needed to start a new medication today. Told patient that lifestyle modifications were unlikely to bring her sugars to goal levels, and that she could benefit from starting a new agent, however she still declined.   Essential hypertension BP slightly above goal to 154/82 today. Has been tolerating addition of hydralazine since last visit with no side effects. Will increase hydralazine to qid dosing today.   Follow up in 1-2 weeks.    Healthcare maintenance Due for colonoscopy, pneumonia vaccine, and mammogram. Did not discuss today due to time constraints.  Will need to be addressed at future appointments.   Patient will be following up with ophthalmology for eye exam.    Orders Placed This Encounter  Procedures  . Glucose, capillary  . POCT A1C  . POCT glucose (manual entry)    Meds ordered this encounter  Medications  . hydrALAZINE (APRESOLINE) 25 MG tablet    Sig: Take 1 tablet (25 mg total) by mouth 4 (four) times daily.    Dispense:  180 tablet    Refill:  1    Linzi Ohlinger M. Jerline Pain, [place] Resident PGY-1 03/02/2014 11:16 AM

## 2014-03-09 ENCOUNTER — Other Ambulatory Visit: Payer: Self-pay | Admitting: *Deleted

## 2014-03-10 MED ORDER — HYDROCHLOROTHIAZIDE 25 MG PO TABS: 25.0000 mg | ORAL_TABLET | Freq: Every day | ORAL | Status: DC

## 2014-05-06 ENCOUNTER — Other Ambulatory Visit: Payer: Self-pay | Admitting: *Deleted

## 2014-05-06 MED ORDER — IBUPROFEN 400 MG PO TABS: 400.0000 mg | ORAL_TABLET | Freq: Four times a day (QID) | ORAL | Status: DC | PRN

## 2014-05-17 ENCOUNTER — Ambulatory Visit (INDEPENDENT_AMBULATORY_CARE_PROVIDER_SITE_OTHER): Payer: 59 | Admitting: Internal Medicine

## 2014-05-17 VITALS — BP 132/64 | HR 81 | Temp 97.9°F | Resp 16 | Ht 64.0 in | Wt 237.2 lb

## 2014-05-17 DIAGNOSIS — R059 Cough, unspecified: Secondary | ICD-10-CM

## 2014-05-17 DIAGNOSIS — R05 Cough: Secondary | ICD-10-CM | POA: Diagnosis not present

## 2014-05-17 DIAGNOSIS — J988 Other specified respiratory disorders: Secondary | ICD-10-CM

## 2014-05-17 DIAGNOSIS — J22 Unspecified acute lower respiratory infection: Secondary | ICD-10-CM

## 2014-05-17 MED ORDER — HYDROCODONE-HOMATROPINE 5-1.5 MG/5ML PO SYRP: 5.0000 mL | ORAL_SOLUTION | Freq: Four times a day (QID) | ORAL | Status: DC | PRN

## 2014-05-17 MED ORDER — BENZONATATE 100 MG PO CAPS: 100.0000 mg | ORAL_CAPSULE | Freq: Two times a day (BID) | ORAL | Status: DC | PRN

## 2014-05-17 NOTE — Progress Notes (Signed)
Subjective:  This chart was scribed for Tami Lin MD,  by Tamsen Roers, at [facility].  This patient was seen in room 3 and the patient's care was started at 8:58 AM.    Patient ID: Rachel Chaney, female    DOB: November 30, 1960, 54 y.o.   MRN: [id number] Chief Complaint  Patient presents with  . Nasal Congestion  . Cough    non productive    HPI HPI Comments: Rachel Chaney is a 54 y.o. female who presents urgent [facility] care with a nonproductive cough and naso congestion onset a week ago. She has not had any changes in her daily activity due to the cough but has been losing sleep over it.  She has not had any changes in her blood sugar from her symptoms.  She denies a sore throat.  She has no other complaints today.  No fever chills or night sweats No history of spring allergies No history of asthma She denies dyspnea on exertion and paroxysmal nocturnal dyspnea  Patient Active Problem List   Diagnosis Date Noted  . Healthcare maintenance 03/02/2014  . Chronic kidney disease due to diabetes mellitus 10/13/2013  . Leukocytosis 05/17/2013  . Anemia in chronic illness 05/17/2013  . Lower extremity edema 02/19/2013  . Numbness of feet 03/11/2012  . Abnormal appearance of cervix 08/08/2011  . Back pain 08/07/2011  . GERD (gastroesophageal reflux disease) 04/27/2010  . Diabetes mellitus with neuropathy 04/02/2006  . HYPERLIPIDEMIA 03/08/2006  . OBESITY, NOS 03/08/2006  . Essential hypertension 03/08/2006    Past Surgical History  Procedure Laterality Date  . Eye surgery Right     retinal detachment   No Known Allergies Prior to Admission medications   Medication Sig Start Date End Date Taking? Authorizing Provider  aspirin EC 81 MG tablet Take 81 mg by mouth daily.   Yes Historical Provider, MD  atorvastatin (LIPITOR) 80 MG tablet Take 1 tablet (80 mg total) by mouth daily. 05/23/13  Yes Waldemar Dickens, MD  carvedilol (COREG)  25 MG tablet Take 1 tablet (25 mg total) by mouth 2 (two) times daily with a meal. 12/16/13  Yes [name], MD  cetirizine (ZYRTEC) 10 MG tablet Take 10 mg by mouth daily as needed for allergies.   Yes Historical Provider, MD  enalapril (VASOTEC) 20 MG tablet TAKE 2 TABLETS BY MOUTH DAILY. 12/02/13  Yes [name], MD  glucose blood (ONE TOUCH ULTRA TEST) test strip Use as instructed 05/05/13  Yes Amber Fidel Levy, MD  hydrALAZINE (APRESOLINE) 25 MG tablet Take 1 tablet (25 mg total) by mouth 4 (four) times daily. 03/02/14  Yes Vivi Barrack, MD  hydrochlorothiazide (HYDRODIURIL) 25 MG tablet Take 1 tablet (25 mg total) by mouth daily. 03/10/14  Yes Alyssa A Lincoln Brigham, MD  ibuprofen (ADVIL,MOTRIN) 400 MG tablet Take 1 tablet (400 mg total) by mouth every 6 (six) hours as needed. 05/06/14  Yes Alyssa A Lincoln Brigham, MD  Insulin Glargine (LANTUS SOLOSTAR) 100 UNIT/ML Solostar Pen 50 units in AM, 30 units in PM. 12/31/13  Yes Leone Brand, MD  Insulin Pen Needle 29G X 12MM MISC Inject 1 pen into the skin 2 (two) times daily. Check blood sugar daily. 04/29/13  Yes Waldemar Dickens, MD  Insulin Pen Needle 31G X 5 MM MISC Use as directed for insulin injection bid 01/07/14  Yes Dickie La, MD  omeprazole (PRILOSEC) 20 MG capsule Take 1 capsule (20 mg  total) by mouth daily as needed (acid reflux). 12/09/13  Yes [name], MD  ONETOUCH DELICA LANCETS [id number] MISC 1 Device by Does not apply route as needed (to check blood glucose). 05/02/13  Yes Waldemar Dickens, MD  Syringe, Disposable, 1 ML MISC Use as directed 04/29/13  Yes Waldemar Dickens, MD  acetaminophen (TYLENOL) 500 MG tablet Take 1,000 mg by mouth every 6 (six) hours as needed for pain.    Historical Provider, MD  amitriptyline (ELAVIL) 25 MG tablet Take 1 tablet (25 mg total) by mouth at bedtime. Patient not taking: Reported on 05/17/2014 08/28/13   [name], MD  butalbital-acetaminophen-caffeine (FIORICET) [phone] MG per tablet Take 1 tablet by  mouth every 6 (six) hours as needed for headache. Patient not taking: Reported on 05/17/2014 08/28/13 08/28/14  [name], MD   History   Social History  . Marital Status: Married    Spouse Name: N/A  . Number of Children: N/A  . Years of Education: N/A   Occupational History  . Not on file.   Social History Main Topics  . Smoking status: Never Smoker   . Smokeless tobacco: Never Used  . Alcohol Use: No  . Drug Use: Not on file  . Sexual Activity: Not on file   Other Topics Concern  . Not on file   Social History Narrative        Review of Systems  Constitutional: Negative for fever and chills.  HENT: Positive for congestion. Negative for sneezing, sore throat and trouble swallowing.   Respiratory: Positive for cough. Negative for choking, shortness of breath and wheezing.        Objective:   Physical Exam  Constitutional: She is oriented to person, place, and time. She appears well-developed and well-nourished. No distress.  HENT:  Head: Normocephalic and atraumatic.  Right Ear: External ear normal.  Left Ear: External ear normal.  Nose: Nose normal.  Mouth/Throat: Oropharynx is clear and moist.  Eyes: Conjunctivae and EOM are normal. Pupils are equal, round, and reactive to light.  Neck: Neck supple.  Cardiovascular: Normal rate, regular rhythm and normal heart sounds.   No murmur heard. Pulmonary/Chest: Effort normal and breath sounds normal. No respiratory distress. She has no wheezes. She has no rales.  Musculoskeletal: Normal range of motion.  Lymphadenopathy:    She has no cervical adenopathy.  Neurological: She is alert and oriented to person, place, and time.  Skin: Skin is warm and dry.  Psychiatric: She has a normal mood and affect. Her behavior is normal.  Nursing note and vitals reviewed.         Assessment & Plan:  I have completed the patient encounter in its entirety as documented by the scribe, with editing by me where  necessary. Yossi Hinchman P. Laney Pastor, M.D.  Cough secondary to Lower resp. tract infection--viral  Meds ordered this encounter  Medications  . HYDROcodone-homatropine (HYCODAN) 5-1.5 MG/5ML syrup    Sig: Take 5 mLs by mouth every 6 (six) hours as needed.    Dispense:  120 mL    Refill:  0  . benzonatate (TESSALON) 100 MG capsule    Sig: Take 1 capsule (100 mg total) by mouth 2 (two) times daily as needed for cough.    Dispense:  15 capsule    Refill:  0   Follow-up if develops fever chills or night sweats or cough becomes productive

## 2014-05-21 ENCOUNTER — Encounter: Payer: Self-pay | Admitting: Family Medicine

## 2014-05-21 ENCOUNTER — Ambulatory Visit (INDEPENDENT_AMBULATORY_CARE_PROVIDER_SITE_OTHER): Payer: 59 | Admitting: Family Medicine

## 2014-05-21 VITALS — BP 190/80 | HR 80 | Temp 98.4°F | Ht 64.0 in | Wt 240.4 lb

## 2014-05-21 DIAGNOSIS — S300XXA Contusion of lower back and pelvis, initial encounter: Secondary | ICD-10-CM | POA: Insufficient documentation

## 2014-05-21 MED ORDER — DICLOFENAC SODIUM 1 % TD GEL: 2.0000 g | Freq: Four times a day (QID) | TRANSDERMAL | Status: DC

## 2014-05-21 MED ORDER — HYDROCODONE-ACETAMINOPHEN 10-325 MG PO TABS: 1.0000 | ORAL_TABLET | Freq: Three times a day (TID) | ORAL | Status: DC | PRN

## 2014-05-21 NOTE — Assessment & Plan Note (Signed)
Back/bottom pain since fall on Tuesday. No tenderness or visible bruising. - rec ice, rest, hot showers - vicodin and voltaren gel - rtc if not improving in a week

## 2014-05-21 NOTE — Progress Notes (Signed)
   Subjective:    Patient ID: Early Chars, female    DOB: 1960-12-18, 54 y.o.   MRN: [id number]  HPI Pt presents for tailbone pain. Patient fell on her bottom while making her bed on Tuesday. Since then she has had pain with movement of her left buttock. She has tried [employer] which did not help. Pain is a dull ache. No radiation. No numbness or tingling in her legs other than from her usual diabetic neuropathy. No bowel or bladder dysfunction.   Review of Systems See HPI    Objective:   Physical Exam  Constitutional: She is oriented to person, place, and time. She appears well-developed and well-nourished. She appears distressed.  HENT:  Head: Normocephalic and atraumatic.  Eyes: Conjunctivae are normal.  Cardiovascular: Normal rate.   Pulmonary/Chest: Effort normal. No respiratory distress.  Abdominal: She exhibits no distension.  Musculoskeletal:       Lumbar back: She exhibits pain. She exhibits no tenderness, no bony tenderness, no swelling, no edema, no deformity, no laceration, no spasm and normal pulse.       Back:  Lumbar ROM limited by pain  Neurological: She is alert and oriented to person, place, and time.  Skin: Skin is warm and dry. No rash noted. She is not diaphoretic.  Psychiatric: She has a normal mood and affect. Her behavior is normal.  Nursing note and vitals reviewed.         Assessment & Plan:

## 2014-05-21 NOTE — Patient Instructions (Signed)
Contusion °A contusion is a deep bruise. Contusions happen when an injury causes bleeding under the skin. Signs of bruising include pain, puffiness (swelling), and discolored skin. The contusion may turn blue, purple, or yellow. °HOME CARE  °· Put ice on the injured area. °¨ Put ice in a plastic bag. °¨ Place a towel between your skin and the bag. °¨ Leave the ice on for 15-20 minutes, 03-04 times a day. °· Only take medicine as told by your doctor. °· Rest the injured area. °· If possible, raise (elevate) the injured area to lessen puffiness. °GET HELP RIGHT AWAY IF:  °· You have more bruising or puffiness. °· You have pain that is getting worse. °· Your puffiness or pain is not helped by medicine. °MAKE SURE YOU:  °· Understand these instructions. °· Will watch your condition. °· Will get help right away if you are not doing well or get worse. °Document Released: 06/14/2007 Document Revised: 03/20/2011 Document Reviewed: 10/31/2010 °ExitCare® Patient Information ©2015 [employer]. This information is not intended to replace advice given to you by your health care provider. Make sure you discuss any questions you have with your health care provider. ° °

## 2014-06-10 ENCOUNTER — Telehealth: Payer: Self-pay | Admitting: Hematology and Oncology

## 2014-06-10 NOTE — Telephone Encounter (Signed)
returned call and cx appt per request....pt stated she will call back to r/s

## 2014-06-11 ENCOUNTER — Other Ambulatory Visit: Payer: 59

## 2014-06-11 ENCOUNTER — Ambulatory Visit: Payer: 59 | Admitting: Hematology and Oncology

## 2014-07-01 ENCOUNTER — Other Ambulatory Visit: Payer: Self-pay | Admitting: *Deleted

## 2014-07-01 MED ORDER — SYRINGE (DISPOSABLE) 1 ML MISC: Status: DC

## 2014-07-01 NOTE — Telephone Encounter (Signed)
Please inform pt that syringe Rx filled  Thanks!  Karrisa Didio A. Lincoln Brigham MD, [facility] Resident PGY-1 Pager [phone]

## 2014-07-17 ENCOUNTER — Other Ambulatory Visit: Payer: Self-pay | Admitting: Family Medicine

## 2014-07-20 NOTE — Telephone Encounter (Signed)
Pt completely out. Please refill as soon as possible.

## 2014-07-21 NOTE — Telephone Encounter (Signed)
Rx refilled. Please inform the pt  Thanks  Minnie Legros A. Lincoln Brigham MD, [facility] Resident PGY-2 Pager [phone]

## 2014-08-06 ENCOUNTER — Other Ambulatory Visit: Payer: Self-pay | Admitting: Student

## 2014-08-06 DIAGNOSIS — E785 Hyperlipidemia, unspecified: Secondary | ICD-10-CM

## 2014-08-06 MED ORDER — ATORVASTATIN CALCIUM 80 MG PO TABS: 80.0000 mg | ORAL_TABLET | Freq: Every day | ORAL | Status: DC

## 2014-08-06 NOTE — Telephone Encounter (Signed)
Refill request for Lipitor

## 2014-08-06 NOTE — Telephone Encounter (Signed)
Lipitor refilled. Please inform pt  Thanks  Georgena Weisheit A. Lincoln Brigham MD, [facility] Resident PGY-2 Pager [phone]

## 2014-08-21 ENCOUNTER — Ambulatory Visit (INDEPENDENT_AMBULATORY_CARE_PROVIDER_SITE_OTHER): Payer: 59 | Admitting: Student

## 2014-08-21 VITALS — BP 130/80 | HR 87 | Temp 97.9°F | Wt 243.0 lb

## 2014-08-21 DIAGNOSIS — E1140 Type 2 diabetes mellitus with diabetic neuropathy, unspecified: Secondary | ICD-10-CM

## 2014-08-21 DIAGNOSIS — I10 Essential (primary) hypertension: Secondary | ICD-10-CM

## 2014-08-21 DIAGNOSIS — Z0000 Encounter for general adult medical examination without abnormal findings: Secondary | ICD-10-CM

## 2014-08-21 LAB — POCT GLYCOSYLATED HEMOGLOBIN (HGB A1C): Hemoglobin A1C: 11.3

## 2014-08-21 MED ORDER — INSULIN ASPART 100 UNIT/ML ~~LOC~~ SOLN: 5.0000 [IU] | Freq: Three times a day (TID) | SUBCUTANEOUS | Status: DC

## 2014-08-21 NOTE — Patient Instructions (Addendum)
Follow up in 2 weeks to review blood sugar logs Please take Lantus in AM and PM NOW TAKE Novolog insulin 5units with Breakfast, lunch and dinner, measure blood sugar 2 hours after eating and record BRING THIS RECORD to next visit  Please get AT LEAST 30 minutes of exercise daily and decrease fats, sugar and carbohydrate intake  Also call for mammogram and colonoscopy

## 2014-08-21 NOTE — Assessment & Plan Note (Addendum)
A1c 11.3 today - Will add meal time Novolog 5 units in addition to her current regimen of Lantus 50 AM and Lantus 30 PM - advised pt to check sugars after meals as well as fasting for next two weeks and bring to next visit - Foot exam today 08/21/2014 - optho exam UTD

## 2014-08-21 NOTE — Assessment & Plan Note (Signed)
BP elevated at 193/96 on presentation, but on recheck BP was 130/80. Pt check BPs at home and systolic BPs usually run in the 130s - Will continue current HTN regimen and monitor BP closely

## 2014-08-21 NOTE — Assessment & Plan Note (Signed)
Due for mammogram, colonoscopy and pap smear - Information to schedule mammo, coloposcopy given - Will complete pap at next visit

## 2014-08-21 NOTE — Progress Notes (Signed)
   Subjective:    Patient ID: Early Chars, female    DOB: 01/05/1961, 54 y.o.   MRN: [id number]   CC: DM check  HPI 54 y/o with PMH sig for DM, HTN presents for DM check  DM A1c today 11.3 < 10.6 on 03/02/2014. Pt denies numbness/tingling in feet and hands. Denies injury to feet but does report she noted some right great toe discoloration under her nail after getting a pedicure recently. No pain Pt denies chest pain, SOB.She reports walking approximately 30 minutes twice weekly. She has tried to limits sweets Has had an optho appointment this year and is considering cataract surgery   HTN Checks her blood pressures at home and they usually are iin the [id number] systolic. Reports taking her blood pressure medications regularly   Review of Systems   See HPI for ROS.   Past medical history, surgical, family, and social history reviewed and updated in the EMR as appropriate.  Past Medical History  Diagnosis Date  . Diabetes mellitus   . Hyperlipidemia   . Hypertension    Social History   Social History  . Marital Status: Married    Spouse Name: N/A  . Number of Children: N/A  . Years of Education: N/A   Occupational History  . Not on file.   Social History Main Topics  . Smoking status: Never Smoker   . Smokeless tobacco: Never Used  . Alcohol Use: No  . Drug Use: Not on file  . Sexual Activity: Not on file   Other Topics Concern  . Not on file   Social History Narrative    Objective:  BP 130/80 mmHg  Pulse 87  Temp(Src) 97.9 F (36.6 C) (Oral)  Wt 243 lb (110.224 kg)  LMP 09/18/2011 Vitals and nursing note reviewed  General: NAD Cardiac: RRR, normal heart sounds, no murmurs. 2+ PT pulses bilaterally Respiratory: CTAB, normal effort Abdomen: Obese, soft, nontender, nondistended, Bowel sounds present Extremities: no edema or cyanosis. WWP.- Hematoma unde right great toe- nontender See Quality metric for foot exam Skin: warm and dry, no rashes  noted Neuro: alert and oriented, no focal deficits   Assessment & Plan:  See Problem List      Paxton Kanaan A. Lincoln Brigham MD, [facility] Resident PGY-1 Pager [phone]

## 2014-08-25 ENCOUNTER — Telehealth: Payer: Self-pay | Admitting: *Deleted

## 2014-08-25 ENCOUNTER — Telehealth: Payer: Self-pay | Admitting: Student

## 2014-08-25 MED ORDER — INSULIN ASPART 100 UNIT/ML ~~LOC~~ SOLN: 5.0000 [IU] | Freq: Three times a day (TID) | SUBCUTANEOUS | Status: DC

## 2014-08-25 NOTE — Telephone Encounter (Signed)
Needs to have novolog pen called in at [facility]. Dr called in the [place] Also needs note stating she can have cataract surgery Please advise

## 2014-08-25 NOTE — Telephone Encounter (Signed)
Insulin called to [facility]- please inform pt. Will draft letter for pt to pick up at front desk. Please inform pt  Rachel Chaney A. Lincoln Brigham MD, [facility] Resident PGY-2 Pager [phone]'

## 2014-08-25 NOTE — Telephone Encounter (Signed)
[facility] called stating they received for Novolog vial but the quantity matches the pen.  Verbal order given for the Novolog Flex Pen, quantity of 15 ML (5 pens, 3 ML each).  Order given by Dr. Lincoln Brigham.  Derl Barrow, RN

## 2014-08-25 NOTE — Telephone Encounter (Signed)
Will forward to MD to change medication.  Jazmin Hartsell,CMA

## 2014-08-27 ENCOUNTER — Telehealth: Payer: Self-pay | Admitting: Student

## 2014-08-27 ENCOUNTER — Encounter: Payer: Self-pay | Admitting: Student

## 2014-08-27 NOTE — Telephone Encounter (Signed)
Letter written and routed to blue team  Damaria Stofko A. Lincoln Brigham MD, [facility] Resident PGY-2 Pager [phone]

## 2014-08-27 NOTE — Telephone Encounter (Signed)
Patient is aware. Rachel Chaney,CMA  

## 2014-08-27 NOTE — Telephone Encounter (Signed)
Will forward note back to MD since no note has been written in epic yet. Jazmin Hartsell,CMA

## 2014-08-31 ENCOUNTER — Telehealth: Payer: Self-pay | Admitting: *Deleted

## 2014-08-31 ENCOUNTER — Telehealth: Payer: Self-pay | Admitting: Student

## 2014-08-31 MED ORDER — INSULIN ASPART 100 UNIT/ML FLEXPEN: 5.0000 [IU] | PEN_INJECTOR | Freq: Three times a day (TID) | SUBCUTANEOUS | Status: DC

## 2014-08-31 NOTE — Telephone Encounter (Signed)
Patient requested Novolog vials due to cost of Pen cost to much.  Derl Barrow, RN

## 2014-08-31 NOTE — Telephone Encounter (Signed)
Pt calling and states that this Rx is too expensive at [facility] and would like for it to be sent to [facility] at [address] because it will be cheaper for her. Sadie Reynolds, ASA

## 2014-08-31 NOTE — Telephone Encounter (Signed)
Spoke with patient to inform her that medication was sent to pharmacy.  States that vials might be cheaper at [facility] vs the pens.  Advised patient to check with pharmacy first since we have had to change medication location and type multiple times.  She will check with [facility] and have them send a request if necessary. Chiyo Fay,CMA

## 2014-08-31 NOTE — Telephone Encounter (Signed)
Please send Rx for Novolog Flex Pen to [facility] at [address].  Novolog Flex Pen is not listed as current medication list.  Novolog vials are still listed.  Derl Barrow, RN

## 2014-08-31 NOTE — Telephone Encounter (Signed)
Novolog flex pen sent to [facility] at [address]  Please inform the patient  Alyssa A. Lincoln Brigham MD, [facility] Resident PGY-2 Pager [phone]

## 2014-09-02 MED ORDER — INSULIN ASPART 100 UNIT/ML ~~LOC~~ SOLN: 5.0000 [IU] | Freq: Three times a day (TID) | SUBCUTANEOUS | Status: DC

## 2014-09-02 NOTE — Telephone Encounter (Signed)
Novolog vials sent to [facility] [address]  Please inform the patient  Rachel Chaney A. Lincoln Brigham MD, [facility] Resident PGY-2 Pager [phone]

## 2014-09-23 ENCOUNTER — Other Ambulatory Visit: Payer: Self-pay | Admitting: Family Medicine

## 2014-09-24 NOTE — Telephone Encounter (Signed)
Appresoline refilled   Damia Bobrowski A. Lincoln Brigham MD, [facility] Resident PGY-2 Pager [phone]

## 2014-10-26 ENCOUNTER — Encounter: Payer: Self-pay | Admitting: Family Medicine

## 2014-10-26 ENCOUNTER — Ambulatory Visit (INDEPENDENT_AMBULATORY_CARE_PROVIDER_SITE_OTHER): Payer: Commercial Managed Care - HMO | Admitting: Family Medicine

## 2014-10-26 ENCOUNTER — Ambulatory Visit (HOSPITAL_COMMUNITY)
Admission: RE | Admit: 2014-10-26 | Discharge: 2014-10-26 | Disposition: A | Discharge status: Home / Self Care | Payer: 59 | Source: Ambulatory Visit | Attending: Family Medicine | Admitting: Family Medicine

## 2014-10-26 ENCOUNTER — Encounter: Payer: Self-pay | Admitting: *Deleted

## 2014-10-26 ENCOUNTER — Ambulatory Visit: Payer: Commercial Managed Care - HMO | Admitting: Family Medicine

## 2014-10-26 VITALS — BP 229/101 | HR 79 | Ht 64.0 in | Wt 245.0 lb

## 2014-10-26 VITALS — BP 183/98

## 2014-10-26 DIAGNOSIS — I10 Essential (primary) hypertension: Secondary | ICD-10-CM | POA: Diagnosis not present

## 2014-10-26 DIAGNOSIS — I160 Hypertensive urgency: Secondary | ICD-10-CM | POA: Diagnosis not present

## 2014-10-26 DIAGNOSIS — R9431 Abnormal electrocardiogram [ECG] [EKG]: Secondary | ICD-10-CM | POA: Insufficient documentation

## 2014-10-26 DIAGNOSIS — Z23 Encounter for immunization: Secondary | ICD-10-CM | POA: Diagnosis not present

## 2014-10-26 LAB — BASIC METABOLIC PANEL WITH GFR
BUN: 26 mg/dL — ABNORMAL HIGH (ref 7–25)
CO2: 27 mmol/L (ref 20–31)
Calcium: 8.1 mg/dL — ABNORMAL LOW (ref 8.6–10.4)
Chloride: 112 mmol/L — ABNORMAL HIGH (ref 98–110)
Creat: 2.27 mg/dL — ABNORMAL HIGH (ref 0.50–1.05)
GFR, Est African American: 27 mL/min — ABNORMAL LOW (ref >=60)
GFR, Est Non African American: 24 mL/min — ABNORMAL LOW (ref >=60)
Glucose, Bld: 86 mg/dL (ref 65–99)
Potassium: 3.6 mmol/L (ref 3.5–5.3)
Sodium: 149 mmol/L — ABNORMAL HIGH (ref 135–146)

## 2014-10-26 LAB — LDL CHOLESTEROL, DIRECT: Direct LDL: 112 mg/dL (ref <130)

## 2014-10-26 MED ORDER — AMLODIPINE BESYLATE 5 MG PO TABS: 5.0000 mg | ORAL_TABLET | Freq: Every day | ORAL | Status: DC

## 2014-10-26 MED ORDER — CLONIDINE HCL 0.1 MG PO TABS
0.1000 mg | ORAL_TABLET | Freq: Once | ORAL | Status: AC
  Administered 2014-10-26: 0.1 mg via ORAL

## 2014-10-26 NOTE — Progress Notes (Signed)
Patient ID: Early Chars, female   DOB: March 09, 1960, 54 y.o.   MRN: [id number]    Subjective: CC:  Hypertension HPI: This history was provided by the patient.  Patient is a 54 y.o. female presenting to clinic today for hypertension after presenting to her ophthalmologist's office to have a right eye cataract removed.  Patient has a past medical history of essential hypertension, diabetes and hyperlipidemia.  Pre-op evaluation today prior to surgery for cataract removal, patient's blood pressure was found to be elevated with a SBP over 200 and a DBP greater than 100.  The ophthalmologist did not want to do the procedure with the patient's blood pressure elevated, and sent her to clinic for follow-up.  Patient states she has been checking her blood pressures at home, with typical systolic readings between 168 and 178.  She states this past weekend (10/15-16), her SBP was as high as 200.  Patient states she has been trying to walk for 30 minutes or so 3 times a week and has recently been eating more soup and drinking soft drinks, but not to excess. Patient stated, "I need to work on my diet."  Patient denies dizziness, chest pain, shortness of breath, N/V, headache, changes in vision, numbness/tingling, changes in gait, unilateral or generalized weakness.  Social History Reviewed: non-smoker. FamHx and MedHx updated.  Please see EMR. Health Maintenance: BMP, LDL and flu vaccination  ROS: Per HPI  Objective: Office vital signs reviewed. LMP 09/18/2011  Physical Examination:  General: Awake, alert, well nourished, speaking in complete sentences and in NAD Cardio: RRR, S1S2 heard, no murmurs appreciated, no bruits appreciated.  +1 right pedal edema, not pre-tibial edema noted bilaterally.  No cyanosis or clubbing; +2 radial pulses bilaterally, +1 dorsalis pedis pulses bilaterally Pulm: CTAB, no wheezes, rhonchi or rales, speaking in complete sentences, work of breathing not increased GI: soft,  NT/ND,+BS x4, no hepatomegaly, no splenomegaly GU: deffered Extremities: MAE, no arthalgias MSK: Normal gait and station Skin: dry, intact, no rashes or lesions Neuro: Strength and sensation grossly intact, PERRLA 3, CNII-XII grossly intact  Assessment/ Plan: 54 y.o. female   1. Essential hypertension with acutely elevated BP consistent with hypertensive urgency today Uncontrolled chronic HTN (chart review with prior 200s/100s within past year), today with acute elevated BP 229/101 at ophtho office unable to proceed with cataract surgery, sent today for evaluation (worked in from nurse visit), multiple manual re-checks and s/p clonidine 0.1mg  BP improved to approx 180/80s, asymptomatic and determined safe to go home from clinic - Concern for secondary causes to explain patient's uncontrolled HTN, with refractory HTN on 4 agents to control her HTN and seems compliant and consistent in taking her medications (can verbalize her meds and dosages). Suspicious for renal artery stenosis given patient's age.  Additionally, other factors most likely contributing to HTN include morbid obesity, sodium intake, lack of physical activity and caffeine intake  Medications:  Hydralazine 25 mg TID (increasing to QID), HCTZ 25 mg daily, Carvedilol 25 mg BID and Enalapril 20 mg BID, add Amlodipine 5 mg once a day to regimen  Complications:  CKD III in setting of DM2  Plan - Reassurance with likely acute on chronic uncontrolled BP, asymptomatic today and BP improved to SBP approx 180, s/p Clonidine 0.1mg  in clinic x 1 dose - Check EKG now to r/o acute evidence of ischemia (also for baseline, no prior EKG available), results with sinus rhythm HR 86, PVCs, some non-specific T wave flattening in non-contiguous V1 and V6 -  Check BMET, last SCr 1.7 in 09/2013 with chronic elevations, prior GFR 50s. - Added Amlodipine 5mg  daily (previously chronically taking this, but discontinued 1 year ago due to peripheral edema at  10mg  daily) - Increased Hydralazine from TID to QID (this change was recommended in 02/2014, but patient had not yet increased doses) - Patient to make appointment with Dr. Valentina Lucks to arrange ambulatory blood pressure monitoring - RTC 2-4 weeks f/u with PCP on BP monitoring, once confirm elevated BP and compliance with pharmacy clinic, would strongly consider imaging for RAS - Strict return criteria reviewed for when to go to ED for more emergent evaluation  Sherron Ales, NP student [facility]

## 2014-10-26 NOTE — Assessment & Plan Note (Signed)
1. Essential hypertension with acutely elevated BP consistent with hypertensive urgency today  Uncontrolled chronic HTN (chart review with prior 200s/100s within past year), today with acute elevated BP 229/101 at ophtho office unable to proceed with cataract surgery, sent today for evaluation (worked in from nurse visit), multiple manual re-checks and s/p clonidine 0.1mg  BP improved to approx 180/80s, asymptomatic and determined safe to go home from clinic  - Concern for secondary causes to explain patient's uncontrolled HTN, with refractory HTN on 4 agents to control her HTN and seems compliant and consistent in taking her medications (can verbalize her meds and dosages). Suspicious for renal artery stenosis given patient's age. Additionally, other factors most likely contributing to HTN include morbid obesity, sodium intake, lack of physical activity and caffeine intake  Meds: Hydralazine 25 mg TID (increasing to QID), HCTZ 25 mg daily, Carvedilol 25 mg BID and Enalapril 20 mg BID, add Amlodipine 5 mg once a day to regimen  Complications: CKD III in setting of DM2   Plan  - Reassurance with likely acute on chronic uncontrolled BP, asymptomatic today and BP improved to SBP approx 180, s/p Clonidine 0.1mg  in clinic x 1 dose  - Check EKG now to r/o acute evidence of ischemia (also for baseline, no prior EKG available), results with sinus rhythm HR 86, PVCs, some non-specific T wave flattening in non-contiguous V1 and V6  - Check BMET, last SCr 1.7 in 09/2013 with chronic elevations, prior GFR 50s.  - Added Amlodipine 5mg  daily (previously chronically taking this, but discontinued 1 year ago due to peripheral edema at 10mg  daily)  - Increased Hydralazine from TID to QID (this change was recommended in 02/2014, but patient had not yet increased doses)  - Patient to make appointment with Dr. Valentina Lucks to arrange ambulatory blood pressure monitoring  - RTC 2-4 weeks f/u with PCP on BP monitoring, once confirm  elevated BP and compliance with pharmacy clinic, would strongly consider imaging for RAS  - Strict return criteria reviewed for when to go to ED for more emergent evaluation

## 2014-10-26 NOTE — Patient Instructions (Signed)
Dear Lupe Carney, Thank you for coming in to clinic today.  1. Your blood pressure was significantly elevated today, we have re-checked and it gradually improved, given you one pill of Clonidine to help. Checked EKG, which is unremarkable. 2. Today sent new rx for Amlodipine 5mg  daily - take one pill daily for blood pressure, along with your other BP meds - Hydrochlorothiazide 25mg  daily - Hydralazine 25mg  - FOUR times daily (increase from 3 times) - Coreg 25mg  TWICE daily - Enalapril 40mg  (2 pills, 20mg  pills) daily 3. We will contact you about your blood work later this week 4. Try to reduce sodium in your diet, check food labels  Please schedule a follow-up appointment with Dr. Valentina Lucks ([facility] for Ambulatory Blood pressure monitoring) anytime in the next 1-3 weeks (ask for next available)  Also - schedule to see your PCP - Dr. Marina Goodell anytime in the next 2 to 4 weeks as well, maybe after you see Dr. Valentina Lucks  If you have any other questions or concerns, please feel free to call the clinic to contact me. You may also schedule an earlier appointment if necessary.  However, if your symptoms get significantly worse, please go to the Emergency Department to seek immediate medical attention.  Nobie Putnam, [place]

## 2014-10-26 NOTE — Progress Notes (Signed)
Pt was a walk-in. When she arrived per jazmin blood pressure was 229/101. Deseree Kennon Holter, CMA

## 2014-10-27 ENCOUNTER — Telehealth: Payer: Self-pay | Admitting: Family Medicine

## 2014-10-27 ENCOUNTER — Other Ambulatory Visit: Payer: Self-pay | Admitting: *Deleted

## 2014-10-27 MED ORDER — GLUCOSE BLOOD VI STRP: ORAL_STRIP | Status: DC

## 2014-10-27 NOTE — Telephone Encounter (Signed)
Last OV 10/26/14, see office note for details, briefly with HTN urgency BP up to 220/100+, ultimately left office with improved BP 180/80, and asymptomatic. EKG was unremarkable without evidence for ischemia. Checked BMET, today reviewed BMET results with significant HyperNa to 149, and elevated serum creatinine to 2.27 (from 1.7 in 09/2013, with gradual prior trend increasing 1.4 to 1.7 in 2015) calculated GFR down to 27 from upper 50s.  Reviewed case with preceptor, Dr. Gwendlyn Deutscher today, and then called patient to discuss results (spoke to her husband who got in touch with her to call me back). I relayed the above results, suspected that she currently has Acute Kidney Injury (AKI) in setting of CKD-III, and advised her to follow these recommendations: 1. Discontinue Enalapril 20mg  (x 2 tabs daily) (hold ACE to avoid further nephrotoxicity) 2. Continue Amlodipine 5mg  daily (if BP remains elevated SBP > 160 can increase to 2 tablets or total 10mg  daily) 3. Discontinue Ibuprofen, and all other NSAIDs (Advil, Motrin, Aleve). Advised she may take Tylenol Ext Str 500-1000mg  q 6 hr PRN pain 4. Improve hydration with PO intake  She stated that she had checked her BP at home and has been running much better now 120/85 last reading. Remains asymptomatic and was clinically stable in OV yesterday 10/17. Currently via telephone she denies any chest pain, SOB, edema, nausea, vomiting, seizures / tremors, weakness, HA. I advised her of detailed return precautions and when to present to ED.  For follow-up, I advised her to schedule the following appointments: 1. Nurse Visit for BP re-check on this Friday 10/21 2. Pharmacy Clinic with Dr. Valentina Lucks for ambulatory BP monitoring - next available (anytime next 1-3 weeks) 3. Follow-up with PCP next week for AKI and BP and to re-check BMET to follow SCr, in future after ambulatory BP monitoring would consider working up renal artery stenosis  Note forwarded to PCP Dr.  Madolyn Frieze, [place], PGY-3

## 2014-10-27 NOTE — Telephone Encounter (Signed)
Glucose testing strips refilled  Kenady Doxtater A. Lincoln Brigham MD, [place] Resident PGY-2 Pager [phone]

## 2014-11-05 ENCOUNTER — Encounter: Payer: Self-pay | Admitting: Family Medicine

## 2014-11-05 ENCOUNTER — Ambulatory Visit (INDEPENDENT_AMBULATORY_CARE_PROVIDER_SITE_OTHER): Payer: Commercial Managed Care - HMO | Admitting: Family Medicine

## 2014-11-05 DIAGNOSIS — I10 Essential (primary) hypertension: Secondary | ICD-10-CM

## 2014-11-05 LAB — BASIC METABOLIC PANEL
BUN: 34 mg/dL — ABNORMAL HIGH (ref 7–25)
CO2: 27 mmol/L (ref 20–31)
Calcium: 7.7 mg/dL — ABNORMAL LOW (ref 8.6–10.4)
Chloride: 110 mmol/L (ref 98–110)
Creat: 2.72 mg/dL — ABNORMAL HIGH (ref 0.50–1.05)
Glucose, Bld: 77 mg/dL (ref 65–99)
Potassium: 3.3 mmol/L — ABNORMAL LOW (ref 3.5–5.3)
Sodium: 146 mmol/L (ref 135–146)

## 2014-11-05 NOTE — Progress Notes (Signed)
   Subjective:    Patient ID: Rachel Chaney, female    DOB: Dec 21, 1960, 54 y.o.   MRN: [id number]  HPI  Wheezing and Cough For the last few days.  Notices when ambulates.  No chest pain no increased leg swelling.  Feels nose is congested but uses zyrtec for allergies. No sorethroat or sneezing No shortness of breath with rest but some with exertion.  No fever or sputum or sick contacts.  Patient had come for nurse blood pressure check  and wanted to be seen for this.  Hypertension Duration - years, Timing - continous  Severity - severe on multiple medications  Has not take any of hers medications since last PM since went to vote this AM.  No headache or chest pain now.  Did stop enalapril as per recent phone call.  Has not made a follow up appointment with her PCP or for ambulatory blood pressure testing  Renal Injury Noticed on recent bmet.  Has stopped enalapril and ibuprofen.  No new shortness of breath or leg swelling or rash   Chief Complaint noted Review of Symptoms - see HPI PMH - Smoking status noted.   Vital Signs reviewed   Review of Systems     Objective:   Physical Exam  Alert talkative nad  Heart - Regular rate and rhythm.  No murmurs, gallops or rubs.    Lungs:  Normal respiratory effort, chest expands symmetrically. Lungs are clear to auscultation, no crackles or wheezes. Extrem - trace edema and lipidedema no unilateral swelling Nose - mildly congested turbinates Throat: normal mucosa, no exudate, uvula midline, no redness   Pulse Ox Ambulated around clinic - Sats from 94 at rest- to 92 ambutating.  Feels her normal shortness of breath but no wheeze and no chest pain  BP prior to ambulation 192/94      Assessment & Plan:   Wheeze Seems most consistent with  Nasal congestion likely viral vs allergy.  Given poorly controlled blood pressure will not use any decongestants - recommend nasal saline.  No signs of pneumonia or angina or pe   Renal Injury -  seems stable - check bmet

## 2014-11-05 NOTE — Assessment & Plan Note (Signed)
Worsened - likely because did not take her blood pressure medications today.  Strongly emphasized needed to take them as scheduled.  Also to follow up closely with her PCP and for blood pressure monitor. Will check bmet today given recent increase in crt.

## 2014-11-05 NOTE — Patient Instructions (Addendum)
Good to see you today!  Thanks for coming in.  Wheezing - I think you have a mild virus.  If you feel worse or have shortness of breath or high fever or chest pain you should be seen right away or if not better in 1 week.  Use Nasal saline three times a day to help  BP - yours is dangerously high.  Please go home and take your medications right away and take them as scheduled every day If you have headaches or chest pain you should be seen right away  Make an appt today to see Dr Lincoln Brigham if you can not see her within the next week come in for a SDA appointment or with me  Make an appt today to see Dr Valentina Lucks for the bp monitor  I will call you if your lab tests are not normal.  Otherwise we will discuss them at your next visit.

## 2014-11-12 ENCOUNTER — Ambulatory Visit: Payer: 59 | Admitting: Pharmacist

## 2014-11-18 ENCOUNTER — Encounter: Payer: Self-pay | Admitting: Student

## 2014-11-18 ENCOUNTER — Ambulatory Visit (INDEPENDENT_AMBULATORY_CARE_PROVIDER_SITE_OTHER): Payer: Commercial Managed Care - HMO | Admitting: Student

## 2014-11-18 VITALS — BP 172/82 | HR 76 | Temp 97.7°F | Ht 64.0 in | Wt 262.0 lb

## 2014-11-18 DIAGNOSIS — E1122 Type 2 diabetes mellitus with diabetic chronic kidney disease: Secondary | ICD-10-CM | POA: Diagnosis not present

## 2014-11-18 DIAGNOSIS — Z0000 Encounter for general adult medical examination without abnormal findings: Secondary | ICD-10-CM

## 2014-11-18 DIAGNOSIS — I10 Essential (primary) hypertension: Secondary | ICD-10-CM

## 2014-11-18 DIAGNOSIS — E1140 Type 2 diabetes mellitus with diabetic neuropathy, unspecified: Secondary | ICD-10-CM

## 2014-11-18 DIAGNOSIS — N189 Chronic kidney disease, unspecified: Secondary | ICD-10-CM

## 2014-11-18 LAB — POCT GLYCOSYLATED HEMOGLOBIN (HGB A1C): Hemoglobin A1C: 8.4

## 2014-11-18 LAB — GLUCOSE, CAPILLARY: Glucose-Capillary: 80 mg/dL (ref 65–99)

## 2014-11-18 MED ORDER — CARVEDILOL 25 MG PO TABS: 50.0000 mg | ORAL_TABLET | Freq: Two times a day (BID) | ORAL | Status: DC

## 2014-11-18 NOTE — Assessment & Plan Note (Signed)
A1c 8.4 today from 11.3 at last check. Episodes of hypoglycemia in setting of increasing Cr. Concern for decreased clearance of insulin.  - stop meal time insulin - pt due to eye exam, encouraged to get one - foot exam done today

## 2014-11-18 NOTE — Assessment & Plan Note (Signed)
HTN still uncontrolled, limited in term of meds we can use in setting of swelling with norvasc, rising Cr making ACE/ARB less desireable - Increase Coreg to 50 mg BID - recheck BP in 1 week

## 2014-11-18 NOTE — Patient Instructions (Addendum)
Return in 1 week for blood pressure check/MD visit Increase Coreg to 50 mg twice a day Stop meal time insulin PLEASE BRING ALL MEDICINES WITH YOU TO YOUR NEXT VISIT  Obtain twice as many veg's as protein or carbohydrate foods for both lunch and dinner.  Call office with any questions on concerns at [phone]

## 2014-11-18 NOTE — Assessment & Plan Note (Signed)
Concern for AKI on CKD last Cr 2.72 on 10/27 from 2.27, potentially due to poorly controlled HTN and DM. There is some concern that she is taking a nephrotoxic agent although she denies this - Recheck BMP today - She was strongly encouraged to bring all meds with her to next visit

## 2014-11-18 NOTE — Progress Notes (Signed)
   Subjective:    Patient ID: Rachel Chaney, female    DOB: October 09, 1960, 54 y.o.   MRN: [id number]   CC: BP/ Diabetes follow up  HPI  54 y/o F with h/o DM, HTN presents for blood pressure and diabetes follow up  Diabetes - On presenting to day pt felt shaky and when CBG was checked it was 65, then on recheck was 80. She was given peanut butter crackers which resolved this sensation - A1c today 8.4 from 11.3 on 08/21/2014. Pt was started on meal time insulin at that time and since then reports that her blood sugars after taking her meal time insulin is typically less than 100 sometimes as low was 60. She occasionally feels " shaky" when this happens  HTN - Home blood pressures typically 180s-200s/80-90s - Stopped norvasc because of LE swelling, enalapril stopped for renal injury concern - Has been taking coreg 25 BID, hydralazine 25 TID, HCTZ 25 qD - Denies chest pain, SOB, HA  Review of Systems   See HPI for ROS.   Past Medical History  Diagnosis Date  . Diabetes mellitus   . Hyperlipidemia   . Hypertension    Past Surgical History  Procedure Laterality Date  . Eye surgery Right     retinal detachment   OB History    No data available     Social History   Social History  . Marital Status: Married    Spouse Name: N/A  . Number of Children: N/A  . Years of Education: N/A   Occupational History  . Not on file.   Social History Main Topics  . Smoking status: Never Smoker   . Smokeless tobacco: Never Used  . Alcohol Use: No  . Drug Use: Not on file  . Sexual Activity: Not on file   Other Topics Concern  . Not on file   Social History Narrative    Objective:  BP 172/82 mmHg  Pulse 76  Temp(Src) 97.7 F (36.5 C) (Oral)  Ht 5\' 4"  (1.626 m)  Wt 262 lb (118.842 kg)  BMI 44.95 kg/m2  SpO2 95%  LMP 09/18/2011 Vitals and nursing note reviewed  General: NAD Cardiac: RRR,  Respiratory: CTAB, normal effort Extremities: 1+ LE edema, no tenderness, 1+ DP  pulses, bilateral feet with out lesions Neuro: alert and oriented, no focal deficits   Assessment & Plan:    Diabetes mellitus with neuropathy A1c 8.4 today from 11.3 at last check. Episodes of hypoglycemia in setting of increasing Cr. Concern for decreased clearance of insulin.  - stop meal time insulin - pt due to eye exam, encouraged to get one - foot exam done today  Essential hypertension HTN still uncontrolled, limited in term of meds we can use in setting of swelling with norvasc, rising Cr making ACE/ARB less desireable - Increase Coreg to 50 mg BID - recheck BP in 1 week  Chronic kidney disease due to diabetes mellitus Concern for AKI on CKD last Cr 2.72 on 10/27 from 2.27, potentially due to poorly controlled HTN and DM. There is some concern that she is taking a nephrotoxic agent although she denies this - Recheck BMP today - She was strongly encouraged to bring all meds with her to next visit  Healthcare maintenance Mammo ordered Colonoscopy info given  Pap at next visit     Alyssa A. Lincoln Brigham MD, [facility] Resident PGY-1 Pager [phone]

## 2014-11-18 NOTE — Assessment & Plan Note (Signed)
Mammo ordered Colonoscopy info given  Pap at next visit

## 2014-11-19 LAB — BASIC METABOLIC PANEL
BUN: 33 mg/dL — ABNORMAL HIGH (ref 7–25)
CO2: 27 mmol/L (ref 20–31)
Calcium: 7.7 mg/dL — ABNORMAL LOW (ref 8.6–10.4)
Chloride: 111 mmol/L — ABNORMAL HIGH (ref 98–110)
Creat: 2.5 mg/dL — ABNORMAL HIGH (ref 0.50–1.05)
Glucose, Bld: 72 mg/dL (ref 65–99)
Potassium: 4.1 mmol/L (ref 3.5–5.3)
Sodium: 146 mmol/L (ref 135–146)

## 2014-12-13 ENCOUNTER — Emergency Department (HOSPITAL_COMMUNITY): Payer: Commercial Managed Care - HMO

## 2014-12-13 ENCOUNTER — Ambulatory Visit (INDEPENDENT_AMBULATORY_CARE_PROVIDER_SITE_OTHER): Payer: 59 | Admitting: Family Medicine

## 2014-12-13 ENCOUNTER — Inpatient Hospital Stay (HOSPITAL_COMMUNITY)
Admission: EM | Admit: 2014-12-13 | Discharge: 2014-12-18 | DRG: 305 | Disposition: A | Discharge status: Home / Self Care | Payer: Commercial Managed Care - HMO | Attending: Family Medicine | Admitting: Family Medicine

## 2014-12-13 ENCOUNTER — Encounter (HOSPITAL_COMMUNITY): Payer: Self-pay | Admitting: Emergency Medicine

## 2014-12-13 VITALS — BP 146/90 | HR 82 | Temp 97.5°F | Resp 18 | Wt 270.0 lb

## 2014-12-13 DIAGNOSIS — N182 Chronic kidney disease, stage 2 (mild): Secondary | ICD-10-CM

## 2014-12-13 DIAGNOSIS — D72829 Elevated white blood cell count, unspecified: Secondary | ICD-10-CM | POA: Diagnosis present

## 2014-12-13 DIAGNOSIS — R0600 Dyspnea, unspecified: Secondary | ICD-10-CM | POA: Insufficient documentation

## 2014-12-13 DIAGNOSIS — I130 Hypertensive heart and chronic kidney disease with heart failure and stage 1 through stage 4 chronic kidney disease, or unspecified chronic kidney disease: Secondary | ICD-10-CM | POA: Diagnosis present

## 2014-12-13 DIAGNOSIS — E11319 Type 2 diabetes mellitus with unspecified diabetic retinopathy without macular edema: Secondary | ICD-10-CM | POA: Diagnosis present

## 2014-12-13 DIAGNOSIS — I4581 Long QT syndrome: Secondary | ICD-10-CM | POA: Diagnosis present

## 2014-12-13 DIAGNOSIS — R600 Localized edema: Secondary | ICD-10-CM | POA: Diagnosis not present

## 2014-12-13 DIAGNOSIS — N2581 Secondary hyperparathyroidism of renal origin: Secondary | ICD-10-CM | POA: Diagnosis present

## 2014-12-13 DIAGNOSIS — N179 Acute kidney failure, unspecified: Secondary | ICD-10-CM | POA: Diagnosis present

## 2014-12-13 DIAGNOSIS — I161 Hypertensive emergency: Secondary | ICD-10-CM | POA: Diagnosis present

## 2014-12-13 DIAGNOSIS — E8809 Other disorders of plasma-protein metabolism, not elsewhere classified: Secondary | ICD-10-CM | POA: Diagnosis not present

## 2014-12-13 DIAGNOSIS — R809 Proteinuria, unspecified: Secondary | ICD-10-CM | POA: Diagnosis present

## 2014-12-13 DIAGNOSIS — Z79899 Other long term (current) drug therapy: Secondary | ICD-10-CM

## 2014-12-13 DIAGNOSIS — D638 Anemia in other chronic diseases classified elsewhere: Secondary | ICD-10-CM | POA: Diagnosis present

## 2014-12-13 DIAGNOSIS — E1122 Type 2 diabetes mellitus with diabetic chronic kidney disease: Secondary | ICD-10-CM | POA: Diagnosis present

## 2014-12-13 DIAGNOSIS — E1142 Type 2 diabetes mellitus with diabetic polyneuropathy: Secondary | ICD-10-CM | POA: Diagnosis present

## 2014-12-13 DIAGNOSIS — R0902 Hypoxemia: Secondary | ICD-10-CM

## 2014-12-13 DIAGNOSIS — Z794 Long term (current) use of insulin: Secondary | ICD-10-CM

## 2014-12-13 DIAGNOSIS — M7989 Other specified soft tissue disorders: Secondary | ICD-10-CM

## 2014-12-13 DIAGNOSIS — K219 Gastro-esophageal reflux disease without esophagitis: Secondary | ICD-10-CM | POA: Diagnosis present

## 2014-12-13 DIAGNOSIS — I10 Essential (primary) hypertension: Secondary | ICD-10-CM | POA: Insufficient documentation

## 2014-12-13 DIAGNOSIS — E1165 Type 2 diabetes mellitus with hyperglycemia: Secondary | ICD-10-CM | POA: Diagnosis present

## 2014-12-13 DIAGNOSIS — R140 Abdominal distension (gaseous): Secondary | ICD-10-CM

## 2014-12-13 DIAGNOSIS — R252 Cramp and spasm: Secondary | ICD-10-CM | POA: Diagnosis present

## 2014-12-13 DIAGNOSIS — R350 Frequency of micturition: Secondary | ICD-10-CM | POA: Diagnosis present

## 2014-12-13 DIAGNOSIS — R609 Edema, unspecified: Secondary | ICD-10-CM | POA: Diagnosis not present

## 2014-12-13 DIAGNOSIS — N184 Chronic kidney disease, stage 4 (severe): Secondary | ICD-10-CM | POA: Diagnosis present

## 2014-12-13 DIAGNOSIS — E870 Hyperosmolality and hypernatremia: Secondary | ICD-10-CM | POA: Diagnosis not present

## 2014-12-13 DIAGNOSIS — E785 Hyperlipidemia, unspecified: Secondary | ICD-10-CM | POA: Diagnosis present

## 2014-12-13 DIAGNOSIS — N189 Chronic kidney disease, unspecified: Secondary | ICD-10-CM | POA: Insufficient documentation

## 2014-12-13 LAB — COMPREHENSIVE METABOLIC PANEL
ALT: 23 U/L (ref 14–54)
AST: 22 U/L (ref 15–41)
Albumin: 2.9 g/dL — ABNORMAL LOW (ref 3.5–5.0)
Alkaline Phosphatase: 86 U/L (ref 38–126)
Anion gap: 8 (ref 5–15)
BUN: 27 mg/dL — ABNORMAL HIGH (ref 6–20)
CO2: 26 mmol/L (ref 22–32)
Calcium: 8.4 mg/dL — ABNORMAL LOW (ref 8.9–10.3)
Chloride: 113 mmol/L — ABNORMAL HIGH (ref 101–111)
Creatinine, Ser: 2.35 mg/dL — ABNORMAL HIGH (ref 0.44–1.00)
GFR calc Af Amer: 25 mL/min — ABNORMAL LOW (ref >60)
GFR calc non Af Amer: 22 mL/min — ABNORMAL LOW (ref >60)
Glucose, Bld: 51 mg/dL — ABNORMAL LOW (ref 65–99)
Potassium: 3.3 mmol/L — ABNORMAL LOW (ref 3.5–5.1)
Sodium: 147 mmol/L — ABNORMAL HIGH (ref 135–145)
Total Bilirubin: 0.4 mg/dL (ref 0.3–1.2)
Total Protein: 6.4 g/dL — ABNORMAL LOW (ref 6.5–8.1)

## 2014-12-13 LAB — URINALYSIS, ROUTINE W REFLEX MICROSCOPIC
Bilirubin Urine: NEGATIVE
Glucose, UA: NEGATIVE mg/dL
Ketones, ur: NEGATIVE mg/dL
Leukocytes, UA: NEGATIVE
Nitrite: NEGATIVE
Protein, ur: >300 mg/dL — AB
Specific Gravity, Urine: 1.01 (ref 1.005–1.030)
pH: 7.5 (ref 5.0–8.0)

## 2014-12-13 LAB — CBG MONITORING, ED
Glucose-Capillary: 51 mg/dL — ABNORMAL LOW (ref 65–99)
Glucose-Capillary: 125 mg/dL — ABNORMAL HIGH (ref 65–99)

## 2014-12-13 LAB — URINE MICROSCOPIC-ADD ON: Bacteria, UA: NONE SEEN

## 2014-12-13 LAB — BRAIN NATRIURETIC PEPTIDE: B Natriuretic Peptide: 171.7 pg/mL — ABNORMAL HIGH (ref 0.0–100.0)

## 2014-12-13 LAB — CBC
HCT: 35 % — ABNORMAL LOW (ref 36.0–46.0)
Hemoglobin: 10.3 g/dL — ABNORMAL LOW (ref 12.0–15.0)
MCH: 26.8 pg (ref 26.0–34.0)
MCHC: 29.4 g/dL — ABNORMAL LOW (ref 30.0–36.0)
MCV: 90.9 fL (ref 78.0–100.0)
Platelets: 348 10*3/uL (ref 150–400)
RBC: 3.85 MIL/uL — ABNORMAL LOW (ref 3.87–5.11)
RDW: 16.5 % — ABNORMAL HIGH (ref 11.5–15.5)
WBC: 13.9 10*3/uL — ABNORMAL HIGH (ref 4.0–10.5)

## 2014-12-13 LAB — LIPASE, BLOOD: Lipase: 22 U/L (ref 11–51)

## 2014-12-13 MED ORDER — DEXTROSE 50 % IV SOLN
25.0000 g | Freq: Once | INTRAVENOUS | Status: AC
  Administered 2014-12-13: 25 g via INTRAVENOUS
  Filled 2014-12-13: qty 50

## 2014-12-13 MED ORDER — FUROSEMIDE 10 MG/ML IJ SOLN
20.0000 mg | Freq: Once | INTRAMUSCULAR | Status: AC
Start: 2014-12-13 — End: 2014-12-13
  Administered 2014-12-13: 20 mg via INTRAVENOUS
  Filled 2014-12-13: qty 4

## 2014-12-13 MED ORDER — SODIUM CHLORIDE 0.9 % IV SOLN
INTRAVENOUS | Status: DC
  Administered 2014-12-13: 20 mL/h via INTRAVENOUS

## 2014-12-13 MED ORDER — HYDRALAZINE HCL 20 MG/ML IJ SOLN
20.0000 mg | Freq: Once | INTRAMUSCULAR | Status: AC
  Administered 2014-12-13: 20 mg via INTRAVENOUS
  Filled 2014-12-13: qty 1

## 2014-12-13 NOTE — ED Notes (Signed)
[facility] called for transport. 

## 2014-12-13 NOTE — Progress Notes (Addendum)
Subjective:  This chart was scribed for Rachel Cheadle, MD by Moises Blood, Medical Scribe. This patient was seen in Room 5 and the patient's care was started 3:30 PM.   Patient ID: Early Chars, female    DOB: [birth year], 54 y.o.   MRN: [id number] Chief Complaint  Patient presents with  . Abdominal Pain    lower back pain    HPI Rachel Chaney is a 54 y.o. female who presents to [facility] complaining of gradual onset lower back pain and suprapubic abd pain with bloating that started a couple days ago. She's applied heat pad on the areas but without relief. She's had stretch marks appear in lower abd starting a month ago. She describes the abd pain feeling like there's chafing. She denies liver problems. She denies any abd surgery. She denies fever, chills, appetite changes, bowel symptoms, and urinary symptoms.   Breathing When she walks, she noticed shortness of breath with exertion that also started a couple days ago. She denies chest pain. She denies smoking. She denies history of asthma. Her husband notes that she's very short of breath at night for a few months.   Her husband notes severe new lower ext edema  Past Medical History  Diagnosis Date  . Diabetes mellitus   . Hyperlipidemia   . Hypertension    Prior to Admission medications   Medication Sig Start Date End Date Taking? Authorizing Provider  acetaminophen (TYLENOL) 500 MG tablet Take 1,000 mg by mouth every 6 (six) hours as needed for pain.   Yes Historical Provider, MD  amitriptyline (ELAVIL) 25 MG tablet Take 1 tablet (25 mg total) by mouth at bedtime. 08/28/13  Yes [name], MD  atorvastatin (LIPITOR) 80 MG tablet Take 1 tablet (80 mg total) by mouth daily. 08/06/14  Yes Alyssa A Haney, MD  benzonatate (TESSALON) 100 MG capsule Take 1 capsule (100 mg total) by mouth 2 (two) times daily as needed for cough. 05/17/14  Yes Leandrew Koyanagi, MD  carvedilol (COREG) 25 MG tablet Take 2 tablets (50 mg total) by mouth 2  (two) times daily with a meal. 11/18/14  Yes Alyssa A Haney, MD  cetirizine (ZYRTEC) 10 MG tablet Take 10 mg by mouth daily as needed for allergies.   Yes Historical Provider, MD  diclofenac sodium (VOLTAREN) 1 % GEL Apply 2 g topically 4 (four) times daily. 05/21/14  Yes Frazier Richards, MD  glucose blood (ONE TOUCH ULTRA TEST) test strip Use as instructed 10/27/14  Yes Alyssa A Haney, MD  hydrALAZINE (APRESOLINE) 25 MG tablet TAKE 1 TABLET (25 MG TOTAL) BY MOUTH 3 (THREE)TIMES DAILY. 09/24/14  Yes Alyssa A Haney, MD  hydrochlorothiazide (HYDRODIURIL) 25 MG tablet Take 1 tablet (25 mg total) by mouth daily. 03/10/14  Yes Alyssa A Lincoln Brigham, MD  HYDROcodone-acetaminophen (NORCO) 10-325 MG per tablet Take 1 tablet by mouth every 8 (eight) hours as needed. 05/21/14  Yes Frazier Richards, MD  HYDROcodone-homatropine [facility]) 5-1.5 MG/5ML syrup Take 5 mLs by mouth every 6 (six) hours as needed. 05/17/14  Yes Leandrew Koyanagi, MD  ibuprofen (ADVIL,MOTRIN) 400 MG tablet Take 1 tablet (400 mg total) by mouth every 6 (six) hours as needed. 05/06/14  Yes Alyssa A Haney, MD  Insulin Pen Needle 29G X 12MM MISC Inject 1 pen into the skin 2 (two) times daily. Check blood sugar daily. 04/29/13  Yes Waldemar Dickens, MD  Insulin Pen Needle 31G X 5 MM MISC Use as directed  for insulin injection bid 01/07/14  Yes Dickie La, MD  LANTUS SOLOSTAR 100 UNIT/ML Solostar Pen INJECT 50 UNITS IN THE MORNING AND 30 UNITS IN THE EVENING 07/21/14  Yes Veatrice Bourbon, MD  omeprazole (PRILOSEC) 20 MG capsule Take 1 capsule (20 mg total) by mouth daily as needed (acid reflux). 12/09/13  Yes [name], MD  ONETOUCH DELICA LANCETS 99991111 MISC 1 Device by Does not apply route as needed (to check blood glucose). 05/02/13  Yes Waldemar Dickens, MD  Syringe, Disposable, 1 ML MISC Use as directed 07/01/14  Yes Alyssa A Lincoln Brigham, MD  amLODipine (NORVASC) 5 MG tablet Take 1 tablet (5 mg total) by mouth daily. Patient not taking: Reported on 11/18/2014 10/26/14    Olin Hauser, DO  aspirin EC 81 MG tablet Take 81 mg by mouth daily.    Historical Provider, MD  enalapril (VASOTEC) 20 MG tablet TAKE 2 TABLETS BY MOUTH DAILY. Patient not taking: Reported on 12/13/2014 12/02/13   [name], MD   No Known Allergies  Review of Systems  Constitutional: Positive for fatigue. Negative for fever, chills and appetite change.  Respiratory: Positive for shortness of breath.   Cardiovascular: Positive for leg swelling. Negative for chest pain.  Gastrointestinal: Positive for abdominal pain and abdominal distention. Negative for diarrhea, constipation, blood in stool and anal bleeding.  Endocrine: Positive for polyuria.  Genitourinary: Positive for urgency and frequency. Negative for difficulty urinating.  Musculoskeletal: Positive for back pain.  Neurological: Negative for tremors.       Objective:   Physical Exam  Constitutional: She is oriented to person, place, and time. She appears well-developed and well-nourished. No distress.  HENT:  Head: Normocephalic and atraumatic.  Eyes: EOM are normal. Pupils are equal, round, and reactive to light.  Neck: Neck supple.  Cardiovascular: Normal rate.   Pulmonary/Chest: Effort normal. No respiratory distress.  Appears to be orthopneic - RR sig increases when supine for exam  Abdominal: She exhibits distension and mass. Bowel sounds are increased. There is hepatosplenomegaly. There is tenderness in the suprapubic area. There is no rigidity, no guarding and no CVA tenderness.  Poss RUQ mass on exam Suprapubic skin with tenderness, mild erythema and warmth - almost "peau d'orange" in appearance  Musculoskeletal: Normal range of motion.  Pain across bilateral low lumbar  Neurological: She is alert and oriented to person, place, and time.  Skin: Skin is warm and dry.  Numerous large keloid straie over abd new in past mo per pt suprapubic skin mildly erythematous, thickened with prominent pores -  "peau d'orange" type   Psychiatric: She has a normal mood and affect. Her behavior is normal.  Nursing note and vitals reviewed.   BP 146/90 mmHg  Pulse 82  Temp(Src) 97.5 F (36.4 C) (Oral)  Resp 18  Wt 270 lb (122.471 kg)  SpO2 98%  LMP 09/18/2011     Assessment & Plan:  Pt advised that she needs a cmp and imaging today due to numerous concerning exam findings so OV voided and pt sent to [facility] ER for further eval by private vehicle.  Triage notified.   Over the past year her Cr had increased from 1.5 to 2.7 - in the past month her GFR has been 22-25.  She has not had her LFTs checked in over a year and at that time her albumin was very low at 2.8.  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  Pt  with abdominal distension which is so severe it is causing lower extremity edema, new abdominal striae vs varicosities, as well as DOE. Pt does appear to be orthopneic on exam and I am concerned for abdominal mass causing obstruction of venous return and likely severe renal and liver dysfunction as well as uncontrolled IDDM will make treatment difficult - need stat labs an imaging.   Pt and her husband understand and agree to plan.   By signing my name below, I, Moises Blood, attest that this documentation has been prepared under the direction and in the presence of Rachel Cheadle, MD. Electronically Signed: Moises Blood, Jansen. 12/13/2014 , 3:30 PM .

## 2014-12-13 NOTE — ED Notes (Addendum)
Pt states x several months she has had abdominal bloating, SOB and bilateral leg swelling. Pt has been told her 'kidneys don't work like they should' but has not been dx with heart problems. Pt has been sent from UC for further eval of kidneys and liver. Alert and oriented.

## 2014-12-13 NOTE — ED Provider Notes (Addendum)
CSN: [id number]     Arrival date & time 12/13/14  1547 History   First MD Initiated Contact with Patient 12/13/14 1648     Chief Complaint  Patient presents with  . Bloated  . Shortness of Breath     (Consider location/radiation/quality/duration/timing/severity/associated sxs/prior Treatment) Patient is a 54 y.o. female presenting with shortness of breath. The history is provided by the patient and the spouse.  Shortness of Breath Associated symptoms: no abdominal pain, no chest pain, no cough, no fever, no headaches, no neck pain, no rash, no sore throat and no vomiting   Patient w hx iddm, htn, presents w c/o diffuse bilateral leg and abd swelling for the past 3-4 months, and sob in past month.  Hx uncontrolled htn w mild CRI.   Pt states compliant w her normal meds, no recent change. Denies hx liver disease or etoh abuse. Non smoker. No hx asthma or copd. Denies any current or recent chest pain. Denies pnd. ?orthopnea. Pt w unspecified wt gain. Denies cough, sore throat or uri c/o. No fever or chills.      Past Medical History  Diagnosis Date  . Diabetes mellitus   . Hyperlipidemia   . Hypertension    Past Surgical History  Procedure Laterality Date  . Eye surgery Right     retinal detachment   Family History  Problem Relation Age of Onset  . Heart disease Mother   . Heart disease Father    Social History  Substance Use Topics  . Smoking status: Never Smoker   . Smokeless tobacco: Never Used  . Alcohol Use: No   OB History    No data available     Review of Systems  Constitutional: Negative for fever and chills.  HENT: Negative for sore throat.   Eyes: Negative for pain and redness.  Respiratory: Positive for shortness of breath. Negative for cough.   Cardiovascular: Positive for leg swelling. Negative for chest pain.  Gastrointestinal: Negative for vomiting, abdominal pain and diarrhea.  Endocrine: Negative for polyuria.  Genitourinary: Negative for dysuria  and flank pain.  Musculoskeletal: Negative for back pain and neck pain.  Skin: Negative for rash.  Neurological: Negative for headaches.  Hematological: Does not bruise/bleed easily.  Psychiatric/Behavioral: Negative for confusion.      Allergies  Review of patient's allergies indicates no known allergies.  Home Medications   Prior to Admission medications   Medication Sig Start Date End Date Taking? Authorizing Provider  acetaminophen (TYLENOL) 500 MG tablet Take 1,000 mg by mouth every 6 (six) hours as needed for pain.   Yes Historical Provider, MD  atorvastatin (LIPITOR) 80 MG tablet Take 1 tablet (80 mg total) by mouth daily. 08/06/14  Yes Alyssa A Lincoln Brigham, MD  carvedilol (COREG) 25 MG tablet Take 2 tablets (50 mg total) by mouth 2 (two) times daily with a meal. 11/18/14  Yes Alyssa A Haney, MD  cetirizine (ZYRTEC) 10 MG tablet Take 10 mg by mouth daily as needed for allergies.   Yes Historical Provider, MD  hydrALAZINE (APRESOLINE) 25 MG tablet TAKE 1 TABLET (25 MG TOTAL) BY MOUTH 3 (THREE)TIMES DAILY. 09/24/14  Yes Alyssa A Haney, MD  hydrochlorothiazide (HYDRODIURIL) 25 MG tablet Take 1 tablet (25 mg total) by mouth daily. 03/10/14  Yes Alyssa A Haney, MD  LANTUS SOLOSTAR 100 UNIT/ML Solostar Pen INJECT 50 UNITS IN THE MORNING AND 30 UNITS IN THE EVENING 07/21/14  Yes Alyssa A Haney, MD  amitriptyline (ELAVIL) 25 MG tablet Take  1 tablet (25 mg total) by mouth at bedtime. Patient not taking: Reported on 12/13/2014 08/28/13   [name], MD  amLODipine (NORVASC) 5 MG tablet Take 1 tablet (5 mg total) by mouth daily. Patient not taking: Reported on 11/18/2014 10/26/14   Olin Hauser, DO  benzonatate (TESSALON) 100 MG capsule Take 1 capsule (100 mg total) by mouth 2 (two) times daily as needed for cough. Patient not taking: Reported on 12/13/2014 05/17/14   Leandrew Koyanagi, MD  diclofenac sodium (VOLTAREN) 1 % GEL Apply 2 g topically 4 (four) times daily. Patient not taking:  Reported on 12/13/2014 05/21/14   Frazier Richards, MD  enalapril (VASOTEC) 20 MG tablet TAKE 2 TABLETS BY MOUTH DAILY. Patient not taking: Reported on 12/13/2014 12/02/13   [name], MD  glucose blood (ONE TOUCH ULTRA TEST) test strip Use as instructed 10/27/14   Veatrice Bourbon, MD  HYDROcodone-acetaminophen (NORCO) 10-325 MG per tablet Take 1 tablet by mouth every 8 (eight) hours as needed. Patient not taking: Reported on 12/13/2014 05/21/14   Frazier Richards, MD  HYDROcodone-homatropine [facility]) 5-1.5 MG/5ML syrup Take 5 mLs by mouth every 6 (six) hours as needed. Patient not taking: Reported on 12/13/2014 05/17/14   Leandrew Koyanagi, MD  ibuprofen (ADVIL,MOTRIN) 400 MG tablet Take 1 tablet (400 mg total) by mouth every 6 (six) hours as needed. 05/06/14   Alyssa A Haney, MD  Insulin Pen Needle 29G X 12MM MISC Inject 1 pen into the skin 2 (two) times daily. Check blood sugar daily. 04/29/13   Waldemar Dickens, MD  Insulin Pen Needle 31G X 5 MM MISC Use as directed for insulin injection bid 01/07/14   Dickie La, MD  omeprazole (PRILOSEC) 20 MG capsule Take 1 capsule (20 mg total) by mouth daily as needed (acid reflux). Patient not taking: Reported on 12/13/2014 12/09/13   [name], MD  [facility] DELICA LANCETS 99991111 MISC 1 Device by Does not apply route as needed (to check blood glucose). 05/02/13   Waldemar Dickens, MD  Syringe, Disposable, 1 ML MISC Use as directed 07/01/14   Veatrice Bourbon, MD   BP 231/100 mmHg  Pulse 80  Temp(Src) 97.7 F (36.5 C) (Oral)  Resp 22  SpO2 89%  LMP 09/18/2011 Physical Exam  Constitutional: She appears well-developed and well-nourished. No distress.  HENT:  Mouth/Throat: Oropharynx is clear and moist.  Eyes: Conjunctivae are normal. No scleral icterus.  Neck: Neck supple. No tracheal deviation present. No thyromegaly present.  Cardiovascular: Normal rate, regular rhythm, normal heart sounds and intact distal pulses.  Exam reveals no gallop and no friction  rub.   No murmur heard. Pulmonary/Chest: Effort normal and breath sounds normal. No respiratory distress.  Abdominal: Soft. Normal appearance and bowel sounds are normal. She exhibits distension. She exhibits no mass. There is no tenderness. There is no rebound and no guarding.  Genitourinary:  No cva tenderness  Musculoskeletal: She exhibits edema. She exhibits no tenderness.  Diffuse, bil 3+ edema to abd  Neurological: She is alert.  Skin: Skin is warm and dry. No rash noted. She is not diaphoretic.  Psychiatric: She has a normal mood and affect.  Nursing note and vitals reviewed.   ED Course  Procedures (including critical care time) Labs Review   Results for orders placed or performed during the hospital encounter of 12/13/14  Lipase, blood  Result Value Ref Range   Lipase 22 11 - 51 U/L  Comprehensive metabolic panel  Result Value Ref Range   Sodium 147 (H) 135 - 145 mmol/L   Potassium 3.3 (L) 3.5 - 5.1 mmol/L   Chloride 113 (H) 101 - 111 mmol/L   CO2 26 22 - 32 mmol/L   Glucose, Bld 51 (L) 65 - 99 mg/dL   BUN 27 (H) 6 - 20 mg/dL   Creatinine, Ser 2.35 (H) 0.44 - 1.00 mg/dL   Calcium 8.4 (L) 8.9 - 10.3 mg/dL   Total Protein 6.4 (L) 6.5 - 8.1 g/dL   Albumin 2.9 (L) 3.5 - 5.0 g/dL   AST 22 15 - 41 U/L   ALT 23 14 - 54 U/L   Alkaline Phosphatase 86 38 - 126 U/L   Total Bilirubin 0.4 0.3 - 1.2 mg/dL   GFR calc non Af Amer 22 (L) >60 mL/min   GFR calc Af Amer 25 (L) >60 mL/min   Anion gap 8 5 - 15  CBC  Result Value Ref Range   WBC 13.9 (H) 4.0 - 10.5 K/uL   RBC 3.85 (L) 3.87 - 5.11 MIL/uL   Hemoglobin 10.3 (L) 12.0 - 15.0 g/dL   HCT 35.0 (L) 36.0 - 46.0 %   MCV 90.9 78.0 - 100.0 fL   MCH 26.8 26.0 - 34.0 pg   MCHC 29.4 (L) 30.0 - 36.0 g/dL   RDW 16.5 (H) 11.5 - 15.5 %   Platelets 348 150 - 400 K/uL  Brain natriuretic peptide  Result Value Ref Range   B Natriuretic Peptide 171.7 (H) 0.0 - 100.0 pg/mL   Dg Chest 2 View  12/13/2014  CLINICAL DATA:  Acute onset  of shortness of breath. Initial encounter. EXAM: CHEST  2 VIEW COMPARISON:  Chest radiograph performed 09/11/2013 FINDINGS: The lungs are well-aerated. Vascular congestion is noted, with bilateral central airspace opacities, compatible with pulmonary edema. No significant pleural effusions are seen. No pneumothorax is identified. The heart is mildly enlarged. No acute osseous abnormalities are seen. IMPRESSION: Vascular congestion and mild cardiomegaly, with bilateral central airspace opacities, compatible with pulmonary edema. Electronically Signed   By: Garald Balding M.D.   On: 12/13/2014 18:23       Reviewed and evaluated these images and lab results as part of my medical decision-making.   EKG Interpretation   Date/Time:  Sunday December 13 2014 16:55:47 EST Ventricular Rate:  77 PR Interval:  161 QRS Duration: 91 QT Interval:  449 QTC Calculation: 508 R Axis:   25 Text Interpretation:  Sinus rhythm Low voltage, precordial leads Consider  anterior infarct No significant change since last tracing Confirmed by  Stickney, EMILY ([id number]) on 12/13/2014 4:58:48 PM      MDM   Iv ns. Labs.  Reviewed nursing notes and prior charts for additional history.   bp very high, 231/100, significant edema, vascular congestion on cxr, pulse ox 88-89% rooom air - lasix iv, hydralazine iv.  Recheck, no chest pain.   Given uncontrolled htn, worsening edema, dyspnea/mild hypoxia, will admit for bp/medication management, and further work up.  Pt indicates pcp is [facility] - contacted resident on call at [facility].   Dr Lebron Conners Ree Kida, accepts in transfer to tele bed at [facility]      Lajean Saver, MD 12/13/14 2014

## 2014-12-13 NOTE — Patient Instructions (Signed)
I think you need to go be evaluated at [facility] ER. I am concerned about your new abdominal distention, severe abdominal pain, striae, lower extremity edema.  The abdominal enlargement appears to be blocking venous return to your heart.  You need your kidneys and liver to be checked on immediately and you will likely need imaging of your abodomen since the distention is so severe that it is causing shortness of breath.

## 2014-12-14 ENCOUNTER — Inpatient Hospital Stay (HOSPITAL_COMMUNITY): Payer: Commercial Managed Care - HMO

## 2014-12-14 ENCOUNTER — Encounter (HOSPITAL_COMMUNITY): Payer: Self-pay | Admitting: *Deleted

## 2014-12-14 DIAGNOSIS — E8809 Other disorders of plasma-protein metabolism, not elsewhere classified: Secondary | ICD-10-CM

## 2014-12-14 DIAGNOSIS — I10 Essential (primary) hypertension: Secondary | ICD-10-CM

## 2014-12-14 DIAGNOSIS — E870 Hyperosmolality and hypernatremia: Secondary | ICD-10-CM

## 2014-12-14 DIAGNOSIS — R609 Edema, unspecified: Secondary | ICD-10-CM | POA: Diagnosis present

## 2014-12-14 DIAGNOSIS — R0600 Dyspnea, unspecified: Secondary | ICD-10-CM

## 2014-12-14 LAB — CBC
HCT: 34.5 % — ABNORMAL LOW (ref 36.0–46.0)
Hemoglobin: 10 g/dL — ABNORMAL LOW (ref 12.0–15.0)
MCH: 26.3 pg (ref 26.0–34.0)
MCHC: 29 g/dL — ABNORMAL LOW (ref 30.0–36.0)
MCV: 90.8 fL (ref 78.0–100.0)
Platelets: 356 10*3/uL (ref 150–400)
RBC: 3.8 MIL/uL — ABNORMAL LOW (ref 3.87–5.11)
RDW: 16.6 % — ABNORMAL HIGH (ref 11.5–15.5)
WBC: 15.8 10*3/uL — ABNORMAL HIGH (ref 4.0–10.5)

## 2014-12-14 LAB — RPR: RPR Ser Ql: NONREACTIVE

## 2014-12-14 LAB — RENAL FUNCTION PANEL
Albumin: 2.3 g/dL — ABNORMAL LOW (ref 3.5–5.0)
Anion gap: 8 (ref 5–15)
BUN: 24 mg/dL — ABNORMAL HIGH (ref 6–20)
CO2: 28 mmol/L (ref 22–32)
Calcium: 8.5 mg/dL — ABNORMAL LOW (ref 8.9–10.3)
Chloride: 112 mmol/L — ABNORMAL HIGH (ref 101–111)
Creatinine, Ser: 2.32 mg/dL — ABNORMAL HIGH (ref 0.44–1.00)
GFR calc Af Amer: 26 mL/min — ABNORMAL LOW (ref >60)
GFR calc non Af Amer: 22 mL/min — ABNORMAL LOW (ref >60)
Glucose, Bld: 100 mg/dL — ABNORMAL HIGH (ref 65–99)
Phosphorus: 3.5 mg/dL (ref 2.5–4.6)
Potassium: 3.2 mmol/L — ABNORMAL LOW (ref 3.5–5.1)
Sodium: 148 mmol/L — ABNORMAL HIGH (ref 135–145)

## 2014-12-14 LAB — LIPID PANEL
Cholesterol: 141 mg/dL (ref 0–200)
HDL: 36 mg/dL — ABNORMAL LOW (ref >40)
LDL Cholesterol: 81 mg/dL (ref 0–99)
Total CHOL/HDL Ratio: 3.9 RATIO
Triglycerides: 119 mg/dL (ref <150)
VLDL: 24 mg/dL (ref 0–40)

## 2014-12-14 LAB — GLUCOSE, CAPILLARY
Glucose-Capillary: 85 mg/dL (ref 65–99)
Glucose-Capillary: 110 mg/dL — ABNORMAL HIGH (ref 65–99)
Glucose-Capillary: 178 mg/dL — ABNORMAL HIGH (ref 65–99)
Glucose-Capillary: 196 mg/dL — ABNORMAL HIGH (ref 65–99)

## 2014-12-14 LAB — MAGNESIUM: Magnesium: 1.5 mg/dL — ABNORMAL LOW (ref 1.7–2.4)

## 2014-12-14 LAB — HIV ANTIBODY (ROUTINE TESTING W REFLEX): HIV Screen 4th Generation wRfx: NONREACTIVE

## 2014-12-14 LAB — TSH: TSH: 3.177 u[IU]/mL (ref 0.350–4.500)

## 2014-12-14 LAB — TROPONIN I
Troponin I: <0.03 ng/mL (ref <0.031)
Troponin I: <0.03 ng/mL (ref <0.031)

## 2014-12-14 LAB — CREATININE, URINE, RANDOM: Creatinine, Urine: 42.03 mg/dL

## 2014-12-14 LAB — SODIUM, URINE, RANDOM: Sodium, Ur: 112 mmol/L

## 2014-12-14 MED ORDER — AMLODIPINE BESYLATE 5 MG PO TABS
5.0000 mg | ORAL_TABLET | Freq: Every day | ORAL | Status: DC
  Administered 2014-12-16 (×2): 5 mg via ORAL
  Filled 2014-12-14 (×4): qty 1

## 2014-12-14 MED ORDER — ACETAMINOPHEN 325 MG PO TABS
650.0000 mg | ORAL_TABLET | Freq: Once | ORAL | Status: AC
  Administered 2014-12-14: 650 mg via ORAL
  Filled 2014-12-14: qty 2

## 2014-12-14 MED ORDER — CARVEDILOL 25 MG PO TABS: 25.0000 mg | ORAL_TABLET | Freq: Two times a day (BID) | ORAL | Status: DC

## 2014-12-14 MED ORDER — INSULIN ASPART 100 UNIT/ML ~~LOC~~ SOLN
0.0000 [IU] | Freq: Every day | SUBCUTANEOUS | Status: DC
  Administered 2014-12-17: 2 [IU] via SUBCUTANEOUS

## 2014-12-14 MED ORDER — AMLODIPINE BESYLATE 5 MG PO TABS
5.0000 mg | ORAL_TABLET | Freq: Every day | ORAL | Status: DC
Start: 2014-12-14 — End: 2014-12-14

## 2014-12-14 MED ORDER — MAGNESIUM CHLORIDE 64 MG PO TBEC
1.0000 | DELAYED_RELEASE_TABLET | Freq: Two times a day (BID) | ORAL | Status: AC
  Administered 2014-12-15: 64 mg via ORAL
  Filled 2014-12-14 (×2): qty 1

## 2014-12-14 MED ORDER — HEPARIN SODIUM (PORCINE) 5000 UNIT/ML IJ SOLN
5000.0000 [IU] | Freq: Three times a day (TID) | INTRAMUSCULAR | Status: DC
  Administered 2014-12-14 – 2014-12-18 (×13): 5000 [IU] via SUBCUTANEOUS
  Filled 2014-12-14 (×12): qty 1

## 2014-12-14 MED ORDER — SODIUM CHLORIDE 0.9 % IJ SOLN
3.0000 mL | Freq: Two times a day (BID) | INTRAMUSCULAR | Status: DC
  Administered 2014-12-14 – 2014-12-18 (×8): 3 mL via INTRAVENOUS

## 2014-12-14 MED ORDER — INSULIN ASPART 100 UNIT/ML ~~LOC~~ SOLN
0.0000 [IU] | Freq: Three times a day (TID) | SUBCUTANEOUS | Status: DC
  Administered 2014-12-14 – 2014-12-15 (×4): 3 [IU] via SUBCUTANEOUS
  Administered 2014-12-16: 5 [IU] via SUBCUTANEOUS
  Administered 2014-12-16 – 2014-12-17 (×4): 3 [IU] via SUBCUTANEOUS
  Administered 2014-12-17: 5 [IU] via SUBCUTANEOUS
  Administered 2014-12-18 (×2): 3 [IU] via SUBCUTANEOUS

## 2014-12-14 MED ORDER — INSULIN GLARGINE 100 UNIT/ML ~~LOC~~ SOLN
30.0000 [IU] | Freq: Every day | SUBCUTANEOUS | Status: DC
  Filled 2014-12-14 (×2): qty 0.3

## 2014-12-14 MED ORDER — CARVEDILOL 25 MG PO TABS
25.0000 mg | ORAL_TABLET | Freq: Two times a day (BID) | ORAL | Status: DC
  Administered 2014-12-14 – 2014-12-16 (×5): 25 mg via ORAL
  Filled 2014-12-14 (×6): qty 1

## 2014-12-14 MED ORDER — POTASSIUM CHLORIDE CRYS ER 20 MEQ PO TBCR
40.0000 meq | EXTENDED_RELEASE_TABLET | Freq: Two times a day (BID) | ORAL | Status: DC
  Administered 2014-12-14 – 2014-12-15 (×3): 40 meq via ORAL
  Filled 2014-12-14 (×3): qty 2

## 2014-12-14 NOTE — Progress Notes (Signed)
  Echocardiogram 2D Echocardiogram has been performed.  Jennette Dubin 12/14/2014, 3:42 PM

## 2014-12-14 NOTE — Progress Notes (Signed)
Spoke with on call MD concerning lantus for the night and patient's b/p. MD reported to hold lantus tonight due to low CBG's. Also stated that b/p's were better than before so it is ok for patient to trend in the 160's. Will continue to monitor

## 2014-12-14 NOTE — Progress Notes (Signed)
MD Ree Kida told about b/p this am. He states that norvasc is on board for the morning at this time. He is at bedside as we speak.

## 2014-12-14 NOTE — Progress Notes (Signed)
   12/14/14 1100  Clinical Encounter Type  Visited With Patient and family together  Visit Type Initial  Referral From Care management  Spiritual Encounters  Spiritual Needs Prayer   Chaplain visited with Pt. And Will be avilable

## 2014-12-14 NOTE — H&P (Signed)
[facility] Admission History and Physical Service Pager: [phone]  Patient name: Rachel Chaney Medical record number: [id number] Date of birth: [birth year] Age: 54 y.o. Gender: female  Primary Care Provider: Marina Goodell, MD Consultants: None Code Status: Full   Chief Complaint: Dyspnea, new LE edema, and Abdominal Bloating   Assessment and Plan: Rachel Chaney is a 54 y.o. female presenting with dyspnea, LE edema, and abdominal bloating . PMH is significant for Type II DM with neuropathy, HTN, HLD, and CKD.   Proteinuria: UA at [facility] ED > 300 protein. Patient also presenting with new onset LE edema and Albumin 2.9. Suspect nephrotic syndrome related to long standing uncontrolled Type II DM and HTN. She reports to making urine. Denies any dysuria. She is lasix naive with receiving a dose in the ED last night and having to urinate frequently.  -admit to telemetry, Dr. Ree Kida attending  -renal ultrasound ordered  -lipid panel ordered  -Urine Cr, microalbumin: Cr urine ratio, and renal function panel ordered  -labs to investigate other potential secondary causes: ANA, C3, C4, SPEP, UPEP, Hep B, Hep C, HIV, RPR  -consider nephrology consult  - holding diuresis for now    HTN: Measurements were in the hypertensive emergency range 231/100 in [facility] ED. Doesn't appear to have any End organ damage as her kidney disease has been chronic. Currently stable at 150s-160s/70s-80s s/p Hydralazine 20 mg IV. She has not required admission previously for her hypertension.  -hold HCTZ due to pending lab work and low Cr clearance  -hold enalapril due to potential for nephrotoxicity  -continue to monitor  -continue home amlodipine 5 mg, consider increasing to 10 mg  - may need to add Imdur   Dyspnea: Patient reports new onset SOB. BNP elevated to 171.1. Lung exam clear but given new LE edema and cardiomegaly on CXR will evaluate for CHF. Patient does not have an echo in the  EMR. Denies chest pain, PND, cough, orthopnea, abdominal distention.  -echo ordered  -trending troponin ordered   CKD: Suspect worsening of CKD 2/2 to uncontrolled chronic diseases. Baseline Cr 1 year ago was approximately 1.5. Cr 2.35 at admission. Patient has had increased SCr for the past couple months up to 2.72. Her Enalapril was discontinued in Oct 2016 in order to avoid further nephrotoxicity during suspected AKI. Patient was also instructed to stop taking nephrotoxic agents and to improve PO hydration. Patient does not appear dehydrated so doubt that as cause of AKI.  -investigate further into any recent usage of nephrotoxic agents  -monitor Cr levels   Type II DM: Last HgB A1C 8.4 (11/18/14). Previous A1C 11.3 (08/21/14). Takes Lantus 50 units in AM and 30 units PM at home.  -CBGs q4 hours  -Lantus 30 units QHS  -moderate SSI with nighttime coverage  -HgB A1C ordered   HLD: On Lipitor 80 mg at home. Last lipid panel 05/16/13. -hold home lipitor -lipid panel ordered   Anemia of chronic disease/Leukocytosis: has seen Oncology previously and her blood work didn't suggest any form of myeloproliferative disease. - continue to monitor.    Prolonged QTc: QTc of 508 on EKG from 12/4.  -avoid QTc prolonging medications   FEN/GI: Carb Modified Diet, SLIV  Prophylaxis: Heparin SQ  Disposition: Admit to FPTS; Attending [name]  History of Present Illness:  Rachel Chaney is a 54 y.o. female presenting with SOB, LE edema, and abdominal swelling x2days. She was seen at Urgent Care on the afternoon of 12/4 where there  was concern regarding her elevated Cr (1.5 to 2.7). Was sent to [facility] ED for evaluation.  In the ED she had a CXR which showed mild cardiomegaly, vascular congestion, and bilateral central airspace opacities compatible with pulmonary edema. Patient was also very hypertensive (231/100) and found to have mild hypoxia to 89% on RA. She received Lasix 20 mg IV and Hydralazine 20  mg IV. Blood pressures improved and patient was stable for transfer to [facility].    Presently, patient says her SOB has improved since arrival to the ED earlier. Reports that for the past two days she has felt more gassy and bloated in her abdomen than usual; some of these symptoms were improved after BMs. Afebrile at home. BMs normal. Admits to increased urinary frequency. Denies cardiac history. Does not smoke or drink EtOh. No family hx of kidney disease. Patient reports that she has been compliant with all of her medications. No recent changes in medications that she is aware of. Denies recent travel. Denies symptoms of URI or recent illness.   Review Of Systems: Per HPI with the following additions: None Otherwise the remainder of the systems were negative.  Patient Active Problem List   Diagnosis Date Noted  . Peripheral edema 12/13/2014  . Contusion of buttock 05/21/2014  . Healthcare maintenance 03/02/2014  . Chronic kidney disease due to diabetes mellitus ([place]) 10/13/2013  . Leukocytosis 05/17/2013  . Anemia in chronic illness 05/17/2013  . Lower extremity edema 02/19/2013  . Numbness of feet 03/11/2012  . Abnormal appearance of cervix 08/08/2011  . Back pain 08/07/2011  . GERD (gastroesophageal reflux disease) 04/27/2010  . Diabetes mellitus with neuropathy ([place]) 04/02/2006  . HYPERLIPIDEMIA 03/08/2006  . OBESITY, NOS 03/08/2006  . Essential hypertension 03/08/2006    Past Medical History: Past Medical History  Diagnosis Date  . Diabetes mellitus   . Hyperlipidemia   . Hypertension     Past Surgical History: Past Surgical History  Procedure Laterality Date  . Eye surgery Right     retinal detachment    Social History: Social History  Substance Use Topics  . Smoking status: Never Smoker   . Smokeless tobacco: Never Used  . Alcohol Use: No   Additional social history: none  Please also refer to relevant sections of EMR.  Family History: Family History  Problem  Relation Age of Onset  . Heart disease Mother   . Heart disease Father    No family hx of kidney disease.   Allergies and Medications: No Known Allergies No current facility-administered medications on file prior to encounter.   Current Outpatient Prescriptions on File Prior to Encounter  Medication Sig Dispense Refill  . acetaminophen (TYLENOL) 500 MG tablet Take 1,000 mg by mouth every 6 (six) hours as needed for pain.    Marland Kitchen atorvastatin (LIPITOR) 80 MG tablet Take 1 tablet (80 mg total) by mouth daily. 90 tablet 3  . carvedilol (COREG) 25 MG tablet Take 2 tablets (50 mg total) by mouth 2 (two) times daily with a meal. 60 tablet 11  . cetirizine (ZYRTEC) 10 MG tablet Take 10 mg by mouth daily as needed for allergies.    . hydrALAZINE (APRESOLINE) 25 MG tablet TAKE 1 TABLET (25 MG TOTAL) BY MOUTH 3 (THREE)TIMES DAILY. 180 tablet 5  . hydrochlorothiazide (HYDRODIURIL) 25 MG tablet Take 1 tablet (25 mg total) by mouth daily. 90 tablet 3  . LANTUS SOLOSTAR 100 UNIT/ML Solostar Pen INJECT 50 UNITS IN THE MORNING AND 30 UNITS IN  THE EVENING 3 mL 20  . amitriptyline (ELAVIL) 25 MG tablet Take 1 tablet (25 mg total) by mouth at bedtime. (Patient not taking: Reported on 12/13/2014) 30 tablet 3  . amLODipine (NORVASC) 5 MG tablet Take 1 tablet (5 mg total) by mouth daily. (Patient not taking: Reported on 11/18/2014) 30 tablet 3  . benzonatate (TESSALON) 100 MG capsule Take 1 capsule (100 mg total) by mouth 2 (two) times daily as needed for cough. (Patient not taking: Reported on 12/13/2014) 15 capsule 0  . diclofenac sodium (VOLTAREN) 1 % GEL Apply 2 g topically 4 (four) times daily. (Patient not taking: Reported on 12/13/2014) 100 g 1  . enalapril (VASOTEC) 20 MG tablet TAKE 2 TABLETS BY MOUTH DAILY. (Patient not taking: Reported on 12/13/2014) 60 tablet 11  . glucose blood (ONE TOUCH ULTRA TEST) test strip Use as instructed 100 each 5  . HYDROcodone-acetaminophen (NORCO) 10-325 MG per tablet Take 1  tablet by mouth every 8 (eight) hours as needed. (Patient not taking: Reported on 12/13/2014) 30 tablet 0  . HYDROcodone-homatropine (HYCODAN) 5-1.5 MG/5ML syrup Take 5 mLs by mouth every 6 (six) hours as needed. (Patient not taking: Reported on 12/13/2014) 120 mL 0  . ibuprofen (ADVIL,MOTRIN) 400 MG tablet Take 1 tablet (400 mg total) by mouth every 6 (six) hours as needed. 30 tablet 3  . Insulin Pen Needle 29G X 12MM MISC Inject 1 pen into the skin 2 (two) times daily. Check blood sugar daily. 100 each 6  . Insulin Pen Needle 31G X 5 MM MISC Use as directed for insulin injection bid 200 each 3  . omeprazole (PRILOSEC) 20 MG capsule Take 1 capsule (20 mg total) by mouth daily as needed (acid reflux). (Patient not taking: Reported on 12/13/2014) 90 capsule 0  . ONETOUCH DELICA LANCETS 99991111 MISC 1 Device by Does not apply route as needed (to check blood glucose). 100 each 3  . Syringe, Disposable, 1 ML MISC Use as directed 100 each prn  . [DISCONTINUED] progesterone (PROMETRIUM) 200 MG capsule Take 1 capsule (200 mg total) by mouth daily. Take for 10 days if bleeding occurs for more than 4 days. 10 capsule 1    Objective: BP 165/88 mmHg  Pulse 92  Temp(Src) 98.6 F (37 C) (Oral)  Resp 20  SpO2 95%  LMP 09/18/2011 Exam: General: obese female lying in bed in NAD  Eyes: EOMI  ENTM: MMM. Oropharynx clear.  Neck: Full ROM.  Cardiovascular: RRR. No murmurs appreciated.  Respiratory: CTAB. Normal WOB.  Abdomen: +BS, soft, NTND  MSK: Normal range of motion. 2-3+ pitting edema bilaterally to knees.  Skin: Warm and dry.  Neuro: No gross deficits.  Psych: Appropriate mood and affect.   Labs and Imaging: CBC BMET   Recent Labs Lab 12/13/14 1700  WBC 13.9*  HGB 10.3*  HCT 35.0*  PLT 348    Recent Labs Lab 12/13/14 1700  NA 147*  K 3.3*  CL 113*  CO2 26  BUN 27*  CREATININE 2.35*  GLUCOSE 51*  CALCIUM 8.4*      Dg Chest 2 View  12/13/2014  CLINICAL DATA:  Acute onset of  shortness of breath. Initial encounter. EXAM: CHEST  2 VIEW COMPARISON:  Chest radiograph performed 09/11/2013 FINDINGS: The lungs are well-aerated. Vascular congestion is noted, with bilateral central airspace opacities, compatible with pulmonary edema. No significant pleural effusions are seen. No pneumothorax is identified. The heart is mildly enlarged. No acute osseous abnormalities are seen. IMPRESSION: Vascular congestion  and mild cardiomegaly, with bilateral central airspace opacities, compatible with pulmonary edema. Electronically Signed   By: Garald Balding M.D.   On: 12/13/2014 18:23    Nicolette Bang, DO 12/14/2014, 1:17 AM PGY-1, [place] Intern pager: [phone], text pages welcome  Upper Level Addendum:  I have seen and evaluated this patient along with Dr. Juleen China and reviewed the above note, making necessary revisions in [facility].   Clearance Coots, MD Family Medicine PGY-3

## 2014-12-14 NOTE — Progress Notes (Signed)
VASCULAR LAB PRELIMINARY  PRELIMINARY  PRELIMINARY  PRELIMINARY  Renal artery duplex completed.    Preliminary report:  No obvious evidence of renal artery stenosis bilaterally.  Technically limited study due to body habitus.  Terrika Zuver, RVT 12/14/2014, 2:32 PM

## 2014-12-15 ENCOUNTER — Inpatient Hospital Stay (HOSPITAL_COMMUNITY): Payer: Commercial Managed Care - HMO

## 2014-12-15 DIAGNOSIS — I161 Hypertensive emergency: Principal | ICD-10-CM

## 2014-12-15 DIAGNOSIS — N182 Chronic kidney disease, stage 2 (mild): Secondary | ICD-10-CM

## 2014-12-15 DIAGNOSIS — N189 Chronic kidney disease, unspecified: Secondary | ICD-10-CM | POA: Insufficient documentation

## 2014-12-15 DIAGNOSIS — M7989 Other specified soft tissue disorders: Secondary | ICD-10-CM

## 2014-12-15 DIAGNOSIS — R600 Localized edema: Secondary | ICD-10-CM

## 2014-12-15 DIAGNOSIS — R0600 Dyspnea, unspecified: Secondary | ICD-10-CM | POA: Insufficient documentation

## 2014-12-15 LAB — PROTEIN ELECTROPHORESIS, SERUM
A/G Ratio: 0.8 (ref 0.7–1.7)
Albumin ELP: 2.4 g/dL — ABNORMAL LOW (ref 2.9–4.4)
Alpha-1-Globulin: 0.3 g/dL (ref 0.0–0.4)
Alpha-2-Globulin: 0.8 g/dL (ref 0.4–1.0)
Beta Globulin: 1.2 g/dL (ref 0.7–1.3)
Gamma Globulin: 0.7 g/dL (ref 0.4–1.8)
Globulin, Total: 3 g/dL (ref 2.2–3.9)
Total Protein ELP: 5.4 g/dL — ABNORMAL LOW (ref 6.0–8.5)

## 2014-12-15 LAB — UIFE/LIGHT CHAINS/TP QN, 24-HR UR
% BETA, Urine: 16.5 %
ALPHA 1 URINE: 8.3 %
Albumin, U: 57.1 %
Alpha 2, Urine: 9 %
Free Kappa/Lambda Ratio: 9.2 (ref 2.04–10.37)
Free Lambda Lt Chains,Ur: 19.9 mg/L — ABNORMAL HIGH (ref 0.24–6.66)
Free Lt Chn Excr Rate: 183 mg/L — ABNORMAL HIGH (ref 1.35–24.19)
GAMMA GLOBULIN URINE: 9.2 %
Total Protein, Urine: 441.9 mg/dL

## 2014-12-15 LAB — CBC
HCT: 32.5 % — ABNORMAL LOW (ref 36.0–46.0)
Hemoglobin: 9.3 g/dL — ABNORMAL LOW (ref 12.0–15.0)
MCH: 26.4 pg (ref 26.0–34.0)
MCHC: 28.6 g/dL — ABNORMAL LOW (ref 30.0–36.0)
MCV: 92.3 fL (ref 78.0–100.0)
Platelets: 311 10*3/uL (ref 150–400)
RBC: 3.52 MIL/uL — ABNORMAL LOW (ref 3.87–5.11)
RDW: 16.8 % — ABNORMAL HIGH (ref 11.5–15.5)
WBC: 14.7 10*3/uL — ABNORMAL HIGH (ref 4.0–10.5)

## 2014-12-15 LAB — RENAL FUNCTION PANEL
Albumin: 2.1 g/dL — ABNORMAL LOW (ref 3.5–5.0)
Anion gap: 7 (ref 5–15)
BUN: 23 mg/dL — ABNORMAL HIGH (ref 6–20)
CO2: 27 mmol/L (ref 22–32)
Calcium: 8.2 mg/dL — ABNORMAL LOW (ref 8.9–10.3)
Chloride: 111 mmol/L (ref 101–111)
Creatinine, Ser: 2.5 mg/dL — ABNORMAL HIGH (ref 0.44–1.00)
GFR calc Af Amer: 24 mL/min — ABNORMAL LOW (ref >60)
GFR calc non Af Amer: 20 mL/min — ABNORMAL LOW (ref >60)
Glucose, Bld: 168 mg/dL — ABNORMAL HIGH (ref 65–99)
Phosphorus: 3.4 mg/dL (ref 2.5–4.6)
Potassium: 3.9 mmol/L (ref 3.5–5.1)
Sodium: 145 mmol/L (ref 135–145)

## 2014-12-15 LAB — HEMOGLOBIN A1C
Hgb A1c MFr Bld: 7.2 % — ABNORMAL HIGH (ref 4.8–5.6)
Mean Plasma Glucose: 160 mg/dL

## 2014-12-15 LAB — C4 COMPLEMENT: Complement C4, Body Fluid: 34 mg/dL (ref 14–44)

## 2014-12-15 LAB — GLUCOSE, CAPILLARY
Glucose-Capillary: 169 mg/dL — ABNORMAL HIGH (ref 65–99)
Glucose-Capillary: 162 mg/dL — ABNORMAL HIGH (ref 65–99)
Glucose-Capillary: 183 mg/dL — ABNORMAL HIGH (ref 65–99)
Glucose-Capillary: 191 mg/dL — ABNORMAL HIGH (ref 65–99)

## 2014-12-15 LAB — MICROALBUMIN / CREATININE URINE RATIO
Creatinine, Urine: 35.6 mg/dL
Microalb Creat Ratio: 6893 mg/g creat — ABNORMAL HIGH (ref 0.0–30.0)
Microalb, Ur: 2453.9 ug/mL — ABNORMAL HIGH

## 2014-12-15 LAB — HCV COMMENT:

## 2014-12-15 LAB — HEPATITIS C ANTIBODY (REFLEX): HCV Ab: <0.1 s/co ratio (ref 0.0–0.9)

## 2014-12-15 LAB — HEPATITIS B SURFACE ANTIBODY,QUALITATIVE: Hep B S Ab: NONREACTIVE

## 2014-12-15 LAB — C3 COMPLEMENT: C3 Complement: 159 mg/dL (ref 82–167)

## 2014-12-15 LAB — HEPATITIS B SURFACE ANTIGEN: Hepatitis B Surface Ag: NEGATIVE

## 2014-12-15 MED ORDER — HYDRALAZINE HCL 20 MG/ML IJ SOLN
10.0000 mg | Freq: Once | INTRAMUSCULAR | Status: AC
  Administered 2014-12-15: 10 mg via INTRAVENOUS
  Filled 2014-12-15: qty 1

## 2014-12-15 MED ORDER — HYDRALAZINE HCL 25 MG PO TABS
25.0000 mg | ORAL_TABLET | Freq: Three times a day (TID) | ORAL | Status: DC
  Administered 2014-12-15 – 2014-12-17 (×6): 25 mg via ORAL
  Filled 2014-12-15 (×6): qty 1

## 2014-12-15 MED ORDER — FUROSEMIDE 10 MG/ML IJ SOLN
80.0000 mg | Freq: Two times a day (BID) | INTRAMUSCULAR | Status: DC
  Administered 2014-12-15 – 2014-12-17 (×4): 80 mg via INTRAVENOUS
  Filled 2014-12-15 (×4): qty 8

## 2014-12-15 MED ORDER — ACETAMINOPHEN 325 MG PO TABS
650.0000 mg | ORAL_TABLET | Freq: Once | ORAL | Status: AC
  Administered 2014-12-15: 650 mg via ORAL
  Filled 2014-12-15: qty 2

## 2014-12-15 MED ORDER — FUROSEMIDE 10 MG/ML IJ SOLN
20.0000 mg | Freq: Once | INTRAMUSCULAR | Status: AC
  Administered 2014-12-15: 20 mg via INTRAVENOUS
  Filled 2014-12-15: qty 2

## 2014-12-15 NOTE — Progress Notes (Signed)
Family Medicine Teaching Service Daily Progress Note Intern Pager: [phone]  Patient name: Rachel Chaney Medical record number: [id number] Date of birth: 04/03/1960 Age: 54 y.o. Gender: female  Primary Care Provider: Marina Goodell, MD Consultants: none Code Status: FULL   Assessment and Plan: ADALIZ LARAIA is a 54 y.o. female presenting with dyspnea, LE edema, and abdominal bloating . PMH is significant for Type II DM with neuropathy, HTN, HLD, and CKD.   Proteinuria: UA at [facility] ED > 300 protein. Patient also presenting with new onset LE edema and Albumin 2.9. Suspect nephrotic syndrome related to long standing uncontrolled Type II DM and HTN. She reports making urine. She is lasix naive. Renal US and renal Duplex showed no renal artery stenosis. Cr 2.32, albumin 2.3, GRF 26, urine creatinine 42. C4, C3 WNL; RPR, HIV neg. Intake 240 yesterday, output collected for 24 hr urine protein but not measured yet.  - Labs to investigate other potential secondary causes still pending: ANA, SPEP, UPEP, Hep B, Hep C, aldosterone + renin - Consider nephrology consult pending repeat BMP - Strict I/O's - Restart 24 hr urine protein - issues collecting initial attempt  HTN: Measurements were in the hypertensive emergency range 231/100 in [facility] ED with acute on chronic end organ kidney damage as well as heart failure. Patient taking only Coreg and hydralazine for BP at home. She has not required admission previously for her hypertension. BP in 160s-170s/80s overnight, with one BP of 163/113.  - Hold HCTZ due to pending lab work and low Cr clearance  - Hold enalapril due to potential for nephrotoxicity  - Continue Coreg 25 mg BID, resume home hydralazine - Consider resuming enalapril if Cr stable or improved as well as diuretic  Dyspnea: Patient reports new onset SOB. BNP elevated to 171.1. Lung exam clear but given new LE edema and cardiomegaly on CXR will evaluate for CHF. Denies chest pain, PND,  cough, orthopnea, abdominal distention. Echo shows EF 60-65% with mild LV hypertrophy but no significant valvular abnormalities. Troponins negative x2.  - Continue to monitor   CKD: Suspect worsening of CKD 2/2 to uncontrolled chronic diseases. Baseline Cr 1 year ago was approximately 1.5. Cr 2.35 at admission. Patient has had increased SCr for the past couple months up to 2.72. Her Enalapril was discontinued in Oct 2016 in order to avoid further nephrotoxicity during suspected AKI. Patient was also instructed to stop taking nephrotoxic agents and to improve PO hydration. Patient does not appear dehydrated so doubt that as cause of AKI.  - Monitor Cr levels   Type II DM: Last HgB A1C 8.4 (11/18/14). Previous A1C 11.3 (08/21/14). Takes Lantus 50 units in AM and 30 units PM at home. CBG 178 and 196 overnight.  - CBGs q4 hours  - Hold home Lantus - Moderate SSI with nighttime coverage  - HgB A1C pending  HLD: On Lipitor 80 mg at home. Lipid panel showed slightly decreased HDL at 36 but no other abnormalities.  -Hold home lipitor  Anemia of chronic disease/Leukocytosis: Has seen oncology previously and her blood work does not suggest any form of myeloproliferative disease. WBC remained elevated at 14.7. - Continue to monitor  Prolonged QTc: QTc of 508 (12/4) and 509 (12/5).  -Avoid QTc prolonging medications   FEN/GI: Carb Modified Diet, SLIV  Prophylaxis: Heparin SQ  Disposition: home pending medical improvement   Subjective:  BP of 163/132 this AM. Patient received 10 mg IV hydralazine with subsequent BP of 160/75. Denies HA, vision changes,  or other complaints.    Objective: Temp:  [98.2 F (36.8 C)-98.6 F (37 C)] 98.4 F (36.9 C) (12/06 0554) Pulse Rate:  [76-90] 76 (12/06 0834) Resp:  [18-22] 18 (12/06 0554) BP: (120-179)/(54-132) 160/75 mmHg (12/06 0834) SpO2:  [84 %-97 %] 97 % (12/06 0554) Physical Exam: General: well-appearing female lying in bed in  NAD Cardiovascular: RRR, no murmurs appreciated Respiratory: CTAB, no wheezes, no respiratory distress Abdomen: soft, non-tender, non-distended Extremities: 2+ pitting edema to knees bilaterally with 1+ above knee; no TTP of lower extremities  Laboratory:  Recent Labs Lab 12/13/14 1700 12/14/14 0717 12/15/14 1200  WBC 13.9* 15.8* 14.7*  HGB 10.3* 10.0* 9.3*  HCT 35.0* 34.5* 32.5*  PLT 348 356 311    Recent Labs Lab 12/13/14 1700 12/14/14 0244  NA 147* 148*  K 3.3* 3.2*  CL 113* 112*  CO2 26 28  BUN 27* 24*  CREATININE 2.35* 2.32*  CALCIUM 8.4* 8.5*  PROT 6.4*  --   BILITOT 0.4  --   ALKPHOS 86  --   ALT 23  --   AST 22  --   GLUCOSE 51* 100*    Imaging/Diagnostic Tests: Dg Chest 2 View  12/13/2014  CLINICAL DATA:  Acute onset of shortness of breath. Initial encounter. EXAM: CHEST  2 VIEW COMPARISON:  Chest radiograph performed 09/11/2013 FINDINGS: The lungs are well-aerated. Vascular congestion is noted, with bilateral central airspace opacities, compatible with pulmonary edema. No significant pleural effusions are seen. No pneumothorax is identified. The heart is mildly enlarged. No acute osseous abnormalities are seen. IMPRESSION: Vascular congestion and mild cardiomegaly, with bilateral central airspace opacities, compatible with pulmonary edema. Electronically Signed   By: Garald Balding M.D.   On: 12/13/2014 18:23   US Renal  12/14/2014  CLINICAL DATA:  Acute kidney injury EXAM: RENAL / URINARY TRACT ULTRASOUND COMPLETE COMPARISON:  05/29/2008 FINDINGS: Right Kidney: Length: 12.4 cm. Echogenicity within normal limits. No mass or hydronephrosis visualized. Left Kidney: Length: 12.8 cm. Cyst arising from the inferior pole of the left kidney measures 2.4 x 2.3 x 2.1 cm. Echogenicity within normal limits. No mass or hydronephrosis visualized. Bladder: Appears normal for degree of bladder distention. IMPRESSION: 1. No obstructive uropathy. 2. Left renal cyst. Electronically  Signed   By: Kerby Moors M.D.   On: 12/14/2014 09:22   Verner Mould, MD 12/15/2014, 1:00 PM PGY-1, [place] Intern pager: [phone], text pages welcome

## 2014-12-15 NOTE — Consult Note (Signed)
[facility] Consultation Note Requesting Physician:  Dr. Avon Gully Reason for Consult:  CKD, proteinuria, HTN  HPI: The patient is a 54 y.o. year-old AA female with a background of DM2, HTN, HLD, peripheral neuropathy, retinopathy. We are asked to see her because of CKD and proteinuria.  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  Her workup included HIV, Hep B and C (negative), complements (normal), ANA ordered but not drawn, UPEP with no M-spike, 12.4 and 12.8 cm kidneys by ultrasound, 6.8 grams of proteinuria based on microalb:creatinine ratio. With regard to her HTN she has had a renal artery duplex this admission that by report shows now RAS (I cannot find the report)  She presented to the hospital with worsening LE edema over the past few weeks, abdominal fullness, SOB and accelerated hypertension. Her diuretic PTA was HCTZ. She is said to have had difficult to control HTN historically.  Her pre-admission BP meds were recorded as amlodipine, enalapril, hydralazine 25 TID and carvedilol 50 BID but she was not taking enalapril (states her primary stopped it) or amlodipine. She monitors her BP at home and describes BP's that range from A999333 systolic with diastolics in the 0000000 range. She does not use NSAIDS.  She has received only 2 small 20 mg IV lasix doses since admission. [facility] records appear incomplete.  Creatinine Summary for reference is as follows CREATININE, SER  Date/Time Value Ref Range Status  12/15/2014 12:00 PM 2.50* 0.44 - 1.00 mg/dL Final  12/14/2014 02:44 AM 2.32* 0.44 - 1.00 mg/dL Final  12/13/2014 05:00 PM 2.35* 0.44 - 1.00 mg/dL Final  11/18/2014 2.50    11/05/2014 2.72    10/26/2014 2.27    05/16/2013 1.44    04/19/2013 1.42    05/20/2012 06:18 AM 1.27* 0.50 - 1.10 mg/dL Final  05/19/2012 05:55 AM 1.24* 0.50 - 1.10 mg/dL Final  05/18/2012 05:00 AM 1.20* 0.50 - 1.10 mg/dL Final   05/17/2012 09:40 AM 1.14* 0.50 - 1.10 mg/dL Final  05/16/2012 05:00 AM 1.30* 0.50 - 1.10 mg/dL Final  05/15/2012 11:34 AM 1.39* 0.50 - 1.10 mg/dL Final  04/22/2009 09:29 PM 0.56 0.40-1.20 mg/dL Final  11/11/2007 08:46 PM 0.48 0.40-1.20 mg/dL Final  04/17/2007 09:25 PM 0.53 0.40-1.20 mg/dL Final  03/08/2006 08:42 PM 0.53 0.40-1.20 mg/dL Final     Past Medical History  Diagnosis Date  . Diabetes mellitus   . Hyperlipidemia   . Hypertension      Past Surgical History  Procedure Laterality Date  . Eye surgery Right     retinal detachment     Family History  Problem Relation Age of Onset  . Heart disease Mother   . Heart disease Father   Negative for renal disease, + for DM.  Social History:  reports that she has never smoked. She has never used smokeless tobacco. She reports that she does not drink alcohol. Her drug history is not on file. 1daughter.   Allergies: No Known Allergies   Prior to Admission medications   Medication Sig Start Date End Date Taking? Authorizing Provider  acetaminophen (TYLENOL) 500 MG tablet Take 1,000 mg by mouth every 6 (six) hours as needed for pain.   Yes Historical Provider, MD  atorvastatin (LIPITOR) 80 MG tablet Take 1 tablet (80 mg total) by mouth daily. 08/06/14  Yes Alyssa A Haney, MD  carvedilol (COREG) 25 MG tablet Take 2 tablets (50 mg total) by  mouth 2 (two) times daily with a meal. 11/18/14  Yes Alyssa A Haney, MD  cetirizine (ZYRTEC) 10 MG tablet Take 10 mg by mouth daily as needed for allergies.   Yes Historical Provider, MD  hydrALAZINE (APRESOLINE) 25 MG tablet TAKE 1 TABLET (25 MG TOTAL) BY MOUTH 3 (THREE)TIMES DAILY. 09/24/14  Yes Alyssa A Haney, MD  hydrochlorothiazide (HYDRODIURIL) 25 MG tablet Take 1 tablet (25 mg total) by mouth daily. 03/10/14  Yes Alyssa A Haney, MD  LANTUS SOLOSTAR 100 UNIT/ML Solostar Pen INJECT 50 UNITS IN THE MORNING AND 30 UNITS IN THE EVENING 07/21/14  Yes Alyssa A Haney, MD  amitriptyline (ELAVIL) 25 MG  tablet Take 1 tablet (25 mg total) by mouth at bedtime. Patient not taking: Reported on 12/13/2014 08/28/13   [name], MD  amLODipine (NORVASC) 5 MG tablet Take 1 tablet (5 mg total) by mouth daily. Patient not taking: Reported on 11/18/2014 10/26/14   Olin Hauser, DO  benzonatate (TESSALON) 100 MG capsule Take 1 capsule (100 mg total) by mouth 2 (two) times daily as needed for cough. Patient not taking: Reported on 12/13/2014 05/17/14   Leandrew Koyanagi, MD  diclofenac sodium (VOLTAREN) 1 % GEL Apply 2 g topically 4 (four) times daily. Patient not taking: Reported on 12/13/2014 05/21/14   Frazier Richards, MD  enalapril (VASOTEC) 20 MG tablet TAKE 2 TABLETS BY MOUTH DAILY. Patient not taking: Reported on 12/13/2014 12/02/13   [name], MD  glucose blood (ONE TOUCH ULTRA TEST) test strip Use as instructed 10/27/14   Veatrice Bourbon, MD  HYDROcodone-acetaminophen (NORCO) 10-325 MG per tablet Take 1 tablet by mouth every 8 (eight) hours as needed. Patient not taking: Reported on 12/13/2014 05/21/14   Frazier Richards, MD  HYDROcodone-homatropine [facility]) 5-1.5 MG/5ML syrup Take 5 mLs by mouth every 6 (six) hours as needed. Patient not taking: Reported on 12/13/2014 05/17/14   Leandrew Koyanagi, MD  ibuprofen (ADVIL,MOTRIN) 400 MG tablet Take 1 tablet (400 mg total) by mouth every 6 (six) hours as needed. 05/06/14   Alyssa A Haney, MD  Insulin Pen Needle 29G X 12MM MISC Inject 1 pen into the skin 2 (two) times daily. Check blood sugar daily. 04/29/13   Waldemar Dickens, MD  Insulin Pen Needle 31G X 5 MM MISC Use as directed for insulin injection bid 01/07/14   Dickie La, MD  omeprazole (PRILOSEC) 20 MG capsule Take 1 capsule (20 mg total) by mouth daily as needed (acid reflux). Patient not taking: Reported on 12/13/2014 12/09/13   [name], MD  [facility] DELICA LANCETS [id number] MISC 1 Device by Does not apply route as needed (to check blood glucose). 05/02/13   Waldemar Dickens, MD  Syringe,  Disposable, 1 ML MISC Use as directed 07/01/14   Veatrice Bourbon, MD    Inpatient medications: . amLODipine  5 mg Oral Daily  . carvedilol  25 mg Oral BID WC  . heparin  5,000 Units Subcutaneous 3 times per day  . hydrALAZINE  25 mg Oral 3 times per day  . insulin aspart  0-15 Units Subcutaneous TID WC  . insulin aspart  0-5 Units Subcutaneous QHS  . magnesium chloride  1 tablet Oral BID  . sodium chloride  3 mL Intravenous Q12H    Review of Systems + LE swelling, abd fullness, SOB, exertional dyspnea + cough non-productive No change UOP, no stones, no tea or smokey urine, no foamy urine  Physical Exam:  BP 173/66 mmHg  Pulse 83  Temp(Src) 98.2 F (36.8 C) (Oral)  Resp 20  Ht 5\' 4"  (1.626 m)  Wt 118.162 kg (260 lb 8 oz)  BMI 44.69 kg/m2  SpO2 99%  LMP 09/18/2011  Gen: Obese AAF Very pleasant but dyspneic with moving around on the bed Skin: no rash, cyanosis Neck: Large neck - cannot see neck veins Chest: Crackles both lung bases Heart: S1S2 No audible S3. No murmur or rub. Abdomen: soft, obese, large pannus. No abd wall edema. Ext: 2-3+ pitting edema to the knees bilaterally.  Long scar right lateral leg from prior injury Neuro: alert, Ox3, no focal deficit Heme/Lymph: no bruising or LAN   Labs: Basic Metabolic Panel:  Recent Labs Lab 12/13/14 1700 12/14/14 0244 12/15/14 1200  NA 147* 148* 145  K 3.3* 3.2* 3.9  CL 113* 112* 111  CO2 26 28 27   GLUCOSE 51* 100* 168*  BUN 27* 24* 23*  CREATININE 2.35* 2.32* 2.50*  CALCIUM 8.4* 8.5* 8.2*  PHOS  --  3.5 3.4     Recent Labs Lab 12/13/14 1700 12/14/14 0244 12/15/14 1200  AST 22  --   --   ALT 23  --   --   ALKPHOS 86  --   --   BILITOT 0.4  --   --   PROT 6.4*  --   --   ALBUMIN 2.9* 2.3* 2.1*    Recent Labs Lab 12/13/14 1700  LIPASE 22     Recent Labs Lab 12/13/14 1700 12/14/14 0717 12/15/14 1200  WBC 13.9* 15.8* 14.7*  HGB 10.3* 10.0* 9.3*  HCT 35.0* 34.5* 32.5*  MCV 90.9 90.8 92.3   PLT 348 356 311     Recent Labs Lab 12/14/14 0244 12/14/14 0717  TROPONINI <0.03 <0.03     Recent Labs Lab 12/14/14 0803 12/14/14 1706 12/14/14 2126 12/15/14 0742 12/15/14 1153  GLUCAP 110* 178* 196* 169* 162*      Dg Chest 2 View  12/13/2014  CLINICAL DATA:  Acute onset of shortness of breath. Initial encounter. EXAM: CHEST  2 VIEW COMPARISON:  Chest radiograph performed 09/11/2013 FINDINGS: The lungs are well-aerated. Vascular congestion is noted, with bilateral central airspace opacities, compatible with pulmonary edema. No significant pleural effusions are seen. No pneumothorax is identified. The heart is mildly enlarged. No acute osseous abnormalities are seen. IMPRESSION: Vascular congestion and mild cardiomegaly, with bilateral central airspace opacities, compatible with pulmonary edema. Electronically Signed   By: Garald Balding M.D.   On: 12/13/2014 18:23   US Renal  12/14/2014  CLINICAL DATA:  Acute kidney injury EXAM: RENAL / URINARY TRACT ULTRASOUND COMPLETE COMPARISON:  05/29/2008 FINDINGS: Right Kidney: Length: 12.4 cm. Echogenicity within normal limits. No mass or hydronephrosis visualized. Left Kidney: Length: 12.8 cm. Cyst arising from the inferior pole of the left kidney measures 2.4 x 2.3 x 2.1 cm. Echogenicity within normal limits. No mass or hydronephrosis visualized. Bladder: Appears normal for degree of bladder distention. IMPRESSION: 1. No obstructive uropathy. 2. Left renal cyst. Electronically Signed   By: Kerby Moors M.D.   On: 12/14/2014 09:22   Background 54 y.o. year-old [place] female with a background of DM2, HTN, HLD, peripheral neuropathy, retinopathy. We are asked to see her because of CKD and proteinuria.  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  Her workup included HIV, Hep B and C (negative),  complements (normal), ANA ordered but not drawn, UPEP with no M-spike, 12.4 and  12.8 cm kidneys by ultrasound, 6.8 grams of proteinuria based on microalb:creatinine ratio. With regard to her HTN she has had a renal artery duplex this admission that by report shows now RAS (I cannot find the report)  She presented to the hospital with worsening LE edema, abdominal fullness, SOB and accelerated hypertension.  She is said to have had difficult to control HTN historically.  Her pre-admission BP meds were recorded as amlodipine, enalapril, hydralazine 25 TID and carvedilol 50 BID but she was not taking enalapril (states her primary stopped it) or amlodipine. She monitors her BP at home and describes BP's that range from A999333 systolic with diastolics in the 0000000 range. She does not use NSAIDS. She has not been on lasix as an outpt. We are asked to see.  Assessment/Recommendations 1. CKD4 - has progressed over past year as a result of DM and poorly controlled HTN. 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 She has very substantial edema and needs aggressive diuresis which may help her BP control. Recommend IV lasix and would start with 80 mg IV doses and work up or down depending on diuretic response. Check PTH status. Will require outpt Nephrology followup. DM/HTN control will be paramount to slowing progression. 2. Uncontrolled HTN. Renal artery duplex negative for RAS. Plenty of room to titrate hydralazine and amlodipine. May need addition of clonidine. Needs loop diuretic as noted.  I would not restart her ACE inhibitor given the advanced nature of her CKD. This degree of HTN is NOT that unusual for pts with advanced CKD/DM.  3. Anemia - likely in part CKD related. No iron studies that I can see since 09/2013 - ordered. Replete if tsat low.  4. SOB - vol overload + uncontrolled HTN. Vascular congestion on adm CXR and has not had sig diuresis as of yet. 5. DM2 6. [place],  MD [facility] [phone] pager 12/15/2014, 5:05 PM

## 2014-12-15 NOTE — Progress Notes (Signed)
Pt with an elevated bp of 163/113. Paged provider Juleen China. Pt to get 10mg  of hydralazine iv. Pt with loss of iv access. Awaiting new iv restart then will make sure pt gets iv bp meds. Will continue to monitor pt.

## 2014-12-15 NOTE — Progress Notes (Signed)
Family Medicine Teaching Service Daily Progress Note Intern Pager: [phone]  Patient name: Rachel Chaney Medical record number: [id number] Date of birth: 05/04/1960 Age: 54 y.o. Gender: female  Primary Care Provider: Marina Goodell, MD Consultants: none Code Status: FULL   Assessment and Plan: Rachel Chaney is a 54 y.o. female presenting with dyspnea, LE edema, and abdominal bloating . PMH is significant for Type II DM with neuropathy, HTN, HLD, and CKD.   Proteinuria: UA at [facility] ED > 300 protein. Patient also presenting with new onset LE edema and Albumin 2.9. Suspect nephrotic syndrome related to long standing uncontrolled Type II DM and HTN. She reports making urine. She is lasix naive. Renal US and renal Duplex showed no renal artery stenosis. Labs on admission - Cr 2.32, albumin 2.3, GRF 26, urine creatinine 42, UPEP with no M-spike, complements normal; RPR, HIV, Hep B, Hep C neg. Cr at 2.65 today (increased from 2.5 yesterday). Per nephrology, patient's nephrotic range proteinuria is consistent with DM, however she needs aggressive diuresis. IV Lasix was increased to 80 mg yesterday. IV Lasix 80 mg BID started yesterday. Net I/O yesterday -[phone], suggesting patient is diuresing well. - Protein creatinine ratio, aldosterone + renin, PTH pending - 24 hour urine protein in process - Strict I/O's and daily weights - Cont IV Lasix at 80 mg and adjust accordingly based on UOP  HTN: Measurements were in the hypertensive emergency range 231/100 in [facility] ED with acute on chronic end organ kidney damage as well as heart failure. Patient taking only Coreg and hydralazine for BP at home. She has not required admission previously for her hypertension. BP in 170s/60-70s overnight. Per nephrology, the patient's severity of HTN is not unusual for patients with advanced CKD and DM. Will not resume enalapril given advanced CKD. - Per nephrology, hydralazine can be titrated up if necessary. Can add clonidine  if uncontrolled with two meds.  - Hold HCTZ due to CKD - Continue Coreg 25 mg BID, home hydralazine  Dyspnea: Patient reports new onset SOB. BNP elevated to 171.1. Lung exam clear but given new LE edema and cardiomegaly on CXR will evaluate for CHF. Denies chest pain, PND, cough, orthopnea, abdominal distention. Echo shows EF 60-65% with mild LV hypertrophy but no significant valvular abnormalities. Troponins negative x2.  - Continue to monitor  - Suspect resp status will improve with further diuresis   CKD: Suspect worsening of CKD 2/2 to uncontrolled chronic diseases. Baseline Cr 1 year ago was approximately 1.5. Cr 2.35 at admission. Enalapril was discontinued in Oct 2016 in order to avoid further nephrotoxicity during suspected AKI. Patient was also instructed to stop taking nephrotoxic agents and to improve PO hydration.   - Monitor Cr levels   Type II DM: A1C 7.2 (decreased from 8.4 on 11/18/14) Takes Lantus 50 units in AM and 30 units PM at home. CBG 191 and 183 overnight.  - CBGs q4 hours  - Hold home Lantus - Moderate SSI with nighttime coverage   HLD: On Lipitor 80 mg at home. Lipid panel showed slightly decreased HDL at 36 but no other abnormalities.  -Hold home lipitor  Anemia of chronic disease/Leukocytosis: Has seen oncology previously and her blood work does not suggest any form of myeloproliferative disease. WBC remained elevated at 14.7. Could be related to CKD. Iron panel showed no abnormalities.  - Continue to monitor  Prolonged QTc: QTc of 508 (12/4) and 509 (12/5).  -Avoid QTc prolonging medications    FEN/GI: Carb Modified Diet,  SLIV  Prophylaxis: Heparin SQ  Disposition: home pending medical improvement   Subjective:  Patient denies SOB and believes the swelling in her legs has improved. She has no complaints this morning.   Objective: Temp:  [97.7 F (36.5 C)-98.4 F (36.9 C)] 97.7 F (36.5 C) (12/07 0620) Pulse Rate:  [76-83] 82 (12/07  0620) Resp:  [18-20] 20 (12/07 0620) BP: (160-179)/(66-75) 179/72 mmHg (12/07 0620) SpO2:  [94 %-99 %] 97 % (12/07 0620) Weight:  [264 lb 15.9 oz (120.2 kg)] 264 lb 15.9 oz (120.2 kg) (12/07 [id number]) Physical Exam: General: well-appearing female lying in bed in NAD Cardiovascular: RRR, no murmurs appreciated Respiratory: CTAB, no wheezes, no respiratory distress Abdomen: soft, non-tender, non-distended Extremities: 2+ pitting edema to knee on R, 2+ pitting edema to midshin with 1+ to knee on L (this leg has been elevated in bed for most of the morning, while R leg has been dangling off the bed); no TTP of lower extremities  Laboratory:  Recent Labs Lab 12/13/14 1700 12/14/14 0717 12/15/14 1200  WBC 13.9* 15.8* 14.7*  HGB 10.3* 10.0* 9.3*  HCT 35.0* 34.5* 32.5*  PLT 348 356 311    Recent Labs Lab 12/13/14 1700 12/14/14 0244 12/15/14 1200 12/16/14 0600  NA 147* 148* 145 143  K 3.3* 3.2* 3.9 4.3  CL 113* 112* 111 109  CO2 26 28 27 25   BUN 27* 24* 23* 24*  CREATININE 2.35* 2.32* 2.50* 2.65*  CALCIUM 8.4* 8.5* 8.2* 8.4*  PROT 6.4*  --   --   --   BILITOT 0.4  --   --   --   ALKPHOS 86  --   --   --   ALT 23  --   --   --   AST 22  --   --   --   GLUCOSE 51* 100* 168* 231*    Imaging/Diagnostic Tests: Dg Chest 2 View  12/13/2014  CLINICAL DATA:  Acute onset of shortness of breath. Initial encounter. EXAM: CHEST  2 VIEW COMPARISON:  Chest radiograph performed 09/11/2013 FINDINGS: The lungs are well-aerated. Vascular congestion is noted, with bilateral central airspace opacities, compatible with pulmonary edema. No significant pleural effusions are seen. No pneumothorax is identified. The heart is mildly enlarged. No acute osseous abnormalities are seen. IMPRESSION: Vascular congestion and mild cardiomegaly, with bilateral central airspace opacities, compatible with pulmonary edema. Electronically Signed   By: Garald Balding M.D.   On: 12/13/2014 18:23   US Renal  12/14/2014   CLINICAL DATA:  Acute kidney injury EXAM: RENAL / URINARY TRACT ULTRASOUND COMPLETE COMPARISON:  05/29/2008 FINDINGS: Right Kidney: Length: 12.4 cm. Echogenicity within normal limits. No mass or hydronephrosis visualized. Left Kidney: Length: 12.8 cm. Cyst arising from the inferior pole of the left kidney measures 2.4 x 2.3 x 2.1 cm. Echogenicity within normal limits. No mass or hydronephrosis visualized. Bladder: Appears normal for degree of bladder distention. IMPRESSION: 1. No obstructive uropathy. 2. Left renal cyst. Electronically Signed   By: Kerby Moors M.D.   On: 12/14/2014 09:22   Verner Mould, MD 12/16/2014, 7:59 AM PGY-1, [place] Intern pager: [phone], text pages welcome

## 2014-12-16 DIAGNOSIS — R0902 Hypoxemia: Secondary | ICD-10-CM | POA: Insufficient documentation

## 2014-12-16 LAB — IRON AND TIBC
Iron: 41 ug/dL (ref 28–170)
Saturation Ratios: 12 % (ref 10.4–31.8)
TIBC: 329 ug/dL (ref 250–450)
UIBC: 288 ug/dL

## 2014-12-16 LAB — GLUCOSE, CAPILLARY
Glucose-Capillary: 193 mg/dL — ABNORMAL HIGH (ref 65–99)
Glucose-Capillary: 182 mg/dL — ABNORMAL HIGH (ref 65–99)
Glucose-Capillary: 202 mg/dL — ABNORMAL HIGH (ref 65–99)
Glucose-Capillary: 156 mg/dL — ABNORMAL HIGH (ref 65–99)

## 2014-12-16 LAB — PROTEIN, URINE, 24 HOUR
Collection Interval-UPROT: 24 hours
Protein, 24H Urine: 9000 mg/d — ABNORMAL HIGH (ref 50–100)
Protein, Urine: 300 mg/dL
Urine Total Volume-UPROT: 3000 mL

## 2014-12-16 LAB — RAPID URINE DRUG SCREEN, HOSP PERFORMED
Amphetamines: NOT DETECTED
Barbiturates: NOT DETECTED
Benzodiazepines: NOT DETECTED
Cocaine: NOT DETECTED
Opiates: NOT DETECTED
Tetrahydrocannabinol: NOT DETECTED

## 2014-12-16 LAB — RENAL FUNCTION PANEL
Albumin: 2.4 g/dL — ABNORMAL LOW (ref 3.5–5.0)
Anion gap: 9 (ref 5–15)
BUN: 24 mg/dL — ABNORMAL HIGH (ref 6–20)
CO2: 25 mmol/L (ref 22–32)
Calcium: 8.4 mg/dL — ABNORMAL LOW (ref 8.9–10.3)
Chloride: 109 mmol/L (ref 101–111)
Creatinine, Ser: 2.65 mg/dL — ABNORMAL HIGH (ref 0.44–1.00)
GFR calc Af Amer: 22 mL/min — ABNORMAL LOW (ref >60)
GFR calc non Af Amer: 19 mL/min — ABNORMAL LOW (ref >60)
Glucose, Bld: 231 mg/dL — ABNORMAL HIGH (ref 65–99)
Phosphorus: 3.9 mg/dL (ref 2.5–4.6)
Potassium: 4.3 mmol/L (ref 3.5–5.1)
Sodium: 143 mmol/L (ref 135–145)

## 2014-12-16 LAB — PROTEIN / CREATININE RATIO, URINE
Creatinine, Urine: 36.38 mg/dL
Protein Creatinine Ratio: 6.87 mg/mg{Cre} — ABNORMAL HIGH (ref 0.00–0.15)
Total Protein, Urine: 250 mg/dL

## 2014-12-16 NOTE — Discharge Summary (Signed)
[facility] Discharge Summary  Patient name: Rachel Chaney Medical record number: [id number] Date of birth: 09/30/1960 Age: 54 y.o. Gender: female Date of Admission: 12/13/2014  Date of Discharge: 12/18/2014 Admitting Physician: Lupita Dawn, MD  Primary Care Provider: Marina Goodell, MD Consultants: Nephrology  Indication for Hospitalization: proteinuria, hypertensive emergency, dyspnea  Discharge Diagnoses/Problem List:  Patient Active Problem List   Diagnosis Date Noted  . Hypoxia   . CRI (chronic renal insufficiency)   . Dyspnea   . Hypertensive emergency   . Edema 12/14/2014  . Hypernatremia   . Hypoalbuminemia   . Uncontrolled hypertension   . Peripheral edema 12/13/2014  . Contusion of buttock 05/21/2014  . Healthcare maintenance 03/02/2014  . 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 Leukocytosis 05/17/2013  . Anemia in chronic illness 05/17/2013  . Lower extremity edema 02/19/2013  . Numbness of feet 03/11/2012  . Abnormal appearance of cervix 08/08/2011  . Back pain 08/07/2011  . GERD (gastroesophageal reflux disease) 04/27/2010  . Diabetes mellitus with neuropathy ([place]) 04/02/2006  . HYPERLIPIDEMIA 03/08/2006  . OBESITY, NOS 03/08/2006  . Essential hypertension 03/08/2006    Disposition: home  Discharge Condition: stable  Discharge Exam:  General: well-appearing female sitting up in bed in NAD, watching TV, legs elevated on bed Cardiovascular: RRR, no murmurs appreciated Respiratory: CTAB, no wheezes, no respiratory distress but decreased breath sounds at bases Abdomen: soft, non-tender, non-distended, +BS Extremities: 2+ pitting edema to knees bilaterally; no TTP of lower extremities  Brief Hospital Course:  [name] presented to the [facility] ED for dyspnea, LE edema, and abdominal bloating. In the ED, she was found to have significant proteinuria, and was also in hypertensive emergency, with BP  231/100, evidence of heart failure, and Cr 2.35. She was subsequently transferred to [facility] and admitted.   Proteinuria Patient's UA at [facility] showed protein >300. She also had new onset LE edema as well as albumin of 2.9. Proteinuria was thought to be due to nephrotic syndrome related to long-standing uncontrolled Type II DM and HTN, however labs to rule out secondary causes were performed. ANA, complements, SPEP, UPEP, Hep B&C, RPR, and HIV were all negative. The patient was seen by nephrology, who felt that this degree of proteinuria was not out of the ordinary for a patient with severe uncontrolled DM and HTN, and recommended continued aggressive diuresis. IV Lasix was continued with good diuresis until day before discharge, when patient was transitioned to an equivalent dose of PO Lasix (160 mg BID). She was discharged with this dose for continued diuresis at home.   HTN Patient initially with hypertensive emergency in the ED, with BP of 231/100 and signs of end organ damage (kidney and heart). The patient reported that she actually was not taking all of the medications prescribed to her for hypertension at home. Her BPs slowly improved throughout admission, with most rapid improvement after starting aggressive IV diuresis with Lasix. Patient's home Coreg was decreased, her home hydralazine was continued, and carvedilol was added. Enalapril was not continued given patient's advanced kidney disease, nor was amlodipine, as the patient reported that this caused leg cramps. She was transitioned to PO Lasix with continued improvement. When BPs had stabilized with systolics in the [id number], she was deemed stable for discharge home.   Dyspnea Patient initially very dyspneic. Combination of dyspnea, new LE edema, and cardiomegaly seen on CXR raised suspicion for CHF. Troponins were negative, ruling out MI as cause. Echo showed  EF 60-65% with mild LV hypertrophy but no significant valvular abnormalities. The patient's  dyspnea improved throughout admission, and especially once diuresis began. She denied any dyspnea at discharge.   CKD Patient has known CKD, with baseline Cr 1.5 one year ago. Cr was 2.35 on admission, and continued to rise, with max of 2.65. Worsening of CKD was most likely due to uncontrolled HTN and DM. Cr began to improve with diuresis, and was stable ~2.5 on discharge.   Secondary hyperparathyroidism Patient's PTH was initially measured as part of work-up for proteinuria, and was found to be very elevated at 269. Patient was started on calcitriol 0.25 mcg daily, and will be followed outpatient for this condition.   Anemia of chronic disease/Leukocytosis:  WBC elevated throughout admission, up to 15.8. She has been seen by oncology for this in the past, and blood work does not suggest any form of myeloproliferative disease. Per nephrology, this is most likely related to CKD. She received Feraheme per nephro recs on 12/08.   Issues for Follow Up:  1. Dr. Lorrene Reid (nephrology) discussed decreased sodium intake with patient. May be helpful to reiterate this with patient.  2. Enalapril and amlodipine discontinued due to severity of kidney dysfunction and reported leg cramps respectively.  3. Calcitriol started for secondary hyperparathyroidism. Please monitor outpatient.  4. Some changes were made to patient's medications. She was started on Lasix 160 mg BID, Carvedilol 25 mg BID, calcitriol (see above), and her dose of Coreg was changed to 20 mg BID. She was instructed not to resume Enalapril and any other nephrotoxic agents.  5. Continue to monitor anemia. Patient received Feraheme on 12/08, so hopefully will not need Procrit or Aranesp for a while.   Significant Procedures: none  Significant Labs and Imaging:   Recent Labs Lab 12/13/14 1700 12/14/14 0717 12/15/14 1200  WBC 13.9* 15.8* 14.7*  HGB 10.3* 10.0* 9.3*  HCT 35.0* 34.5* 32.5*  PLT 348 356 311    Recent Labs Lab  12/13/14 1700 12/14/14 0244 12/14/14 0717 12/15/14 1200 12/16/14 0600 12/17/14 0730 12/18/14 1015  NA 147* 148*  --  145 143 141 143  K 3.3* 3.2*  --  3.9 4.3 4.2 3.6  CL 113* 112*  --  111 109 106 108  CO2 26 28  --  27 25 26 28   GLUCOSE 51* 100*  --  168* 231* 267* 190*  BUN 27* 24*  --  23* 24* 24* 24*  CREATININE 2.35* 2.32*  --  2.50* 2.65* 2.63* 2.73*  CALCIUM 8.4* 8.5*  --  8.2* 8.4* 8.3* 8.3*  MG  --   --  1.5*  --   --   --   --   PHOS  --  3.5  --  3.4 3.9 4.0 3.6  ALKPHOS 86  --   --   --   --   --   --   AST 22  --   --   --   --   --   --   ALT 23  --   --   --   --   --   --   ALBUMIN 2.9* 2.3*  --  2.1* 2.4* 2.4* 2.2*    Echo showed EF 60-65% with mild LV hypertrophy but no significant valvular abnormalities.   Results/Tests Pending at Time of Discharge: none  Discharge Medications:    Medication List    STOP taking these medications        amitriptyline  25 MG tablet  Commonly known as:  ELAVIL     amLODipine 5 MG tablet  Commonly known as:  NORVASC     benzonatate 100 MG capsule  Commonly known as:  TESSALON     enalapril 20 MG tablet  Commonly known as:  VASOTEC     hydrochlorothiazide 25 MG tablet  Commonly known as:  HYDRODIURIL     HYDROcodone-acetaminophen 10-325 MG tablet  Commonly known as:  NORCO     HYDROcodone-homatropine 5-1.5 MG/5ML syrup  Commonly known as:  HYCODAN     ibuprofen 400 MG tablet  Commonly known as:  ADVIL,MOTRIN      TAKE these medications        acetaminophen 500 MG tablet  Commonly known as:  TYLENOL  Take 1,000 mg by mouth every 6 (six) hours as needed for pain.     atorvastatin 80 MG tablet  Commonly known as:  LIPITOR  Take 1 tablet (80 mg total) by mouth daily.     calcitRIOL 0.25 MCG capsule  Commonly known as:  ROCALTROL  Take 1 capsule (0.25 mcg total) by mouth daily.     carvedilol 25 MG tablet  Commonly known as:  COREG  Take 2 tablets (50 mg total) by mouth 2 (two) times daily with a  meal.     cetirizine 10 MG tablet  Commonly known as:  ZYRTEC  Take 10 mg by mouth daily as needed for allergies.     cloNIDine 0.1 MG tablet  Commonly known as:  CATAPRES  Take 1 tablet (0.1 mg total) by mouth 2 (two) times daily.     diclofenac sodium 1 % Gel  Commonly known as:  VOLTAREN  Apply 2 g topically 4 (four) times daily.     furosemide 80 MG tablet  Commonly known as:  LASIX  Take 2 tablets (160 mg total) by mouth 2 (two) times daily.     glucose blood test strip  Commonly known as:  ONE TOUCH ULTRA TEST  Use as instructed     hydrALAZINE 50 MG tablet  Commonly known as:  APRESOLINE  Take 1 tablet (50 mg total) by mouth every 6 (six) hours.     Insulin Pen Needle 29G X 12MM Misc  Inject 1 pen into the skin 2 (two) times daily. Check blood sugar daily.     Insulin Pen Needle 31G X 5 MM Misc  Use as directed for insulin injection bid     LANTUS SOLOSTAR 100 UNIT/ML Solostar Pen  Generic drug:  Insulin Glargine  INJECT 50 UNITS IN THE MORNING AND 30 UNITS IN THE EVENING     omeprazole 20 MG capsule  Commonly known as:  PRILOSEC  Take 1 capsule (20 mg total) by mouth daily as needed (acid reflux).     ONETOUCH DELICA LANCETS 99991111 Misc  1 Device by Does not apply route as needed (to check blood glucose).     Syringe (Disposable) 1 ML Misc  Use as directed        Discharge Instructions: Please refer to Patient Instructions section of EMR for full details.  Patient was counseled important signs and symptoms that should prompt return to medical care, changes in medications, dietary instructions, activity restrictions, and follow up appointments.   Follow-Up Appointments: Follow-up Information    Follow up with Chrisandra Netters, MD. Go on 12/22/2014.   Specialty:  Family Medicine   Why:  @11 :15a for hospital follow-up   Contact information:   [id number]  [place] [address] [phone]       Kilan Banfill Joseph Itzae Mccurdy, MD 12/18/2014,  11:11 PM PGY-1, [place]

## 2014-12-16 NOTE — Progress Notes (Signed)
[facility] Rounding Note  Subjective:  Denies SOB Thinks swelling some better - weight however is up 2 KG since admission. Does appear to be diuresing though  Objective:    Vital signs in last 24 hours: Filed Vitals:   12/15/14 0834 12/15/14 1356 12/15/14 2138 12/16/14 0620  BP: 160/75 173/66 170/68 179/72  Pulse: 76 83 81 82  Temp:  98.2 F (36.8 C) 98.4 F (36.9 C) 97.7 F (36.5 C)  TempSrc:  Oral Oral Oral  Resp:  20 18 20   Height:      Weight:    120.2 kg (264 lb 15.9 oz)  SpO2:  99% 94% 97%   Weight change:   Intake/Output Summary (Last 24 hours) at 12/16/14 1155 Last data filed at 12/16/14 1042  Gross per 24 hour  Intake    836 ml  Output   3075 ml  Net  -2239 ml    Physical Exam:  Blood pressure 179/72, pulse 82, temperature 97.7 F (36.5 C), temperature source Oral, resp. rate 20, height 5\' 4"  (1.626 m), weight 120.2 kg (264 lb 15.9 oz), last menstrual period 09/18/2011, SpO2 97 %. Gen: Obese AAF Very pleasant but dyspneic with moving around on the bed Skin: no rash, cyanosis Neck: Large neck - cannot see neck veins Chest: Crackles both lung bases Heart: S1S2 No audible S3.  No murmur or rub. Abdomen: soft, obese, large pannus.  No abd wall edema. Ext: 2-3+ pitting edema to the knees bilaterally.I don't see much difference  Long scar right lateral leg from prior injury  Labs:   Recent Labs Lab 12/13/14 1700 12/14/14 0244 12/15/14 1200 12/16/14 0600  NA 147* 148* 145 143  K 3.3* 3.2* 3.9 4.3  CL 113* 112* 111 109  CO2 26 28 27 25   GLUCOSE 51* 100* 168* 231*  BUN 27* 24* 23* 24*  CREATININE 2.35* 2.32* 2.50* 2.65*  CALCIUM 8.4* 8.5* 8.2* 8.4*  PHOS  --  3.5 3.4 3.9     Recent Labs Lab 12/13/14 1700 12/14/14 0244 12/15/14 1200 12/16/14 0600  AST 22  --   --   --   ALT 23  --   --   --   ALKPHOS 86  --   --   --   BILITOT 0.4  --   --   --   PROT 6.4*  --   --   --   ALBUMIN 2.9* 2.3* 2.1* 2.4*    Recent Labs Lab  12/13/14 1700  LIPASE 22     Recent Labs Lab 12/13/14 1700 12/14/14 0717 12/15/14 1200  WBC 13.9* 15.8* 14.7*  HGB 10.3* 10.0* 9.3*  HCT 35.0* 34.5* 32.5*  MCV 90.9 90.8 92.3  PLT 348 356 311      Recent Labs Lab 12/14/14 0244 12/14/14 0717  TROPONINI <0.03 <0.03     Recent Labs Lab 12/15/14 1153 12/15/14 1700 12/15/14 2237 12/16/14 0758 12/16/14 1149  GLUCAP 162* 183* 191* 193* 182*      Recent Labs Lab 12/16/14 0600  IRON 41  TIBC 329    Medications  . sodium chloride Stopped (12/13/14 1911)   . carvedilol  25 mg Oral BID WC  . furosemide  80 mg Intravenous Q12H  . heparin  5,000 Units Subcutaneous 3 times per day  . hydrALAZINE  25 mg Oral 3 times per day  . insulin aspart  0-15 Units Subcutaneous TID WC  . insulin aspart  0-5 Units Subcutaneous QHS  . sodium  chloride  3 mL Intravenous Q12H    Background 54 y.o. year-old AA female with a background of DM2, HTN, HLD, peripheral neuropathy, retinopathy. We are asked to see her because of CKD and proteinuria. 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 Her workup included HIV, Hep B and C (negative), complements (normal), ANA ordered but not drawn, UPEP with no M-spike, 12.4 and 12.8 cm kidneys by ultrasound, 6.8 grams of proteinuria based on microalb:creatinine ratio. With regard to her HTN she has had a renal artery duplex this admission that by report shows now RAS (I cannot find the report) She presented to the hospital with worsening LE edema, abdominal fullness, SOB and accelerated hypertension. She is said to have had difficult to control HTN historically. Her pre-admission BP meds were recorded as amlodipine, enalapril, hydralazine 25 TID and carvedilol 50 BID but she was not taking enalapril (states her primary stopped it) or amlodipine. She monitors her BP at home and describes BP's that range from [id number] systolic  with diastolics in the 0000000 range. She does not use NSAIDS. She has not been on lasix as an outpt. We were asked to see.   Assessment/Recommendations 1. CKD4 - has progressed over past year as a result of DM and poorly controlled HTN. 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 She needs aggressive diuresis which may help her BP control. Lasix 80 mg IV Q12 ordered and UOP past 24 hours 3 liters.  Renal function has worsened some over the course of the admission (prior to initiation of IV Lasix) and this is not surprising with control of BP.  DM/HTN control will be paramount to slowing progression.  Will require outpt Nephrology followup. 2. CKD-MBD - Checking PTH status.  3. Uncontrolled HTN. Renal artery duplex negative for RAS. Room to titrate hydralazine and amlodipine. May need addition of clonidine. Would not restart her ACE inhibitor given the advanced nature of her CKD.  4. Anemia - likely in part CKD related. Transferrin saturation low at 12%. Would recommend that you replete with Feraheme. (We generally replete CKD pts to TSat of 30% or greater - helps reduce use of/amount of Aranesp or Procrit to maintain Hb >10). If has Hb response to IV Fe may be able to avoid Procrit or Aranesp for a while. 5. SOB - vol overload + uncontrolled HTN. Vascular congestion on adm CXR. Diuresing with lasix 6. DM2 7. HLD 8. Chronic leukocytosis - worked up in the past by heme    Jamal Maes, MD [place] Pager 12/16/2014, 11:55 AM

## 2014-12-17 LAB — RENAL FUNCTION PANEL
Albumin: 2.4 g/dL — ABNORMAL LOW (ref 3.5–5.0)
Anion gap: 9 (ref 5–15)
BUN: 24 mg/dL — ABNORMAL HIGH (ref 6–20)
CO2: 26 mmol/L (ref 22–32)
Calcium: 8.3 mg/dL — ABNORMAL LOW (ref 8.9–10.3)
Chloride: 106 mmol/L (ref 101–111)
Creatinine, Ser: 2.63 mg/dL — ABNORMAL HIGH (ref 0.44–1.00)
GFR calc Af Amer: 22 mL/min — ABNORMAL LOW (ref >60)
GFR calc non Af Amer: 19 mL/min — ABNORMAL LOW (ref >60)
Glucose, Bld: 267 mg/dL — ABNORMAL HIGH (ref 65–99)
Phosphorus: 4 mg/dL (ref 2.5–4.6)
Potassium: 4.2 mmol/L (ref 3.5–5.1)
Sodium: 141 mmol/L (ref 135–145)

## 2014-12-17 LAB — ALDOSTERONE + RENIN ACTIVITY W/ RATIO
ALDO / PRA Ratio: UNDETERMINED
Aldosterone: <1 ng/dL (ref 0.0–30.0)
PRA LC/MS/MS: <0.15 ng/mL/hr

## 2014-12-17 LAB — GLUCOSE, CAPILLARY
Glucose-Capillary: 245 mg/dL — ABNORMAL HIGH (ref 65–99)
Glucose-Capillary: 194 mg/dL — ABNORMAL HIGH (ref 65–99)
Glucose-Capillary: 173 mg/dL — ABNORMAL HIGH (ref 65–99)
Glucose-Capillary: 242 mg/dL — ABNORMAL HIGH (ref 65–99)

## 2014-12-17 LAB — PARATHYROID HORMONE, INTACT (NO CA): PTH: 269 pg/mL — ABNORMAL HIGH (ref 15–65)

## 2014-12-17 MED ORDER — FUROSEMIDE 80 MG PO TABS
160.0000 mg | ORAL_TABLET | Freq: Two times a day (BID) | ORAL | Status: DC
  Administered 2014-12-17 – 2014-12-18 (×2): 160 mg via ORAL
  Filled 2014-12-17 (×2): qty 2

## 2014-12-17 MED ORDER — SODIUM CHLORIDE 0.9 % IV SOLN
510.0000 mg | Freq: Once | INTRAVENOUS | Status: AC
  Administered 2014-12-17: 510 mg via INTRAVENOUS
  Filled 2014-12-17: qty 17

## 2014-12-17 MED ORDER — CLONIDINE HCL 0.1 MG PO TABS
0.1000 mg | ORAL_TABLET | Freq: Two times a day (BID) | ORAL | Status: DC
  Administered 2014-12-17 – 2014-12-18 (×3): 0.1 mg via ORAL
  Filled 2014-12-17 (×3): qty 1

## 2014-12-17 MED ORDER — HYDRALAZINE HCL 25 MG PO TABS: 25.0000 mg | ORAL_TABLET | Freq: Four times a day (QID) | ORAL | Status: DC

## 2014-12-17 MED ORDER — CARVEDILOL 25 MG PO TABS
25.0000 mg | ORAL_TABLET | Freq: Two times a day (BID) | ORAL | Status: DC
  Administered 2014-12-17 – 2014-12-18 (×3): 25 mg via ORAL
  Filled 2014-12-17 (×2): qty 1

## 2014-12-17 MED ORDER — CALCITRIOL 0.25 MCG PO CAPS
0.2500 ug | ORAL_CAPSULE | Freq: Every day | ORAL | Status: DC
  Administered 2014-12-17 – 2014-12-18 (×2): 0.25 ug via ORAL
  Filled 2014-12-17 (×2): qty 1

## 2014-12-17 MED ORDER — CARVEDILOL 25 MG PO TABS: 50.0000 mg | ORAL_TABLET | Freq: Two times a day (BID) | ORAL | Status: DC

## 2014-12-17 MED ORDER — HYDRALAZINE HCL 50 MG PO TABS
50.0000 mg | ORAL_TABLET | Freq: Four times a day (QID) | ORAL | Status: DC
Start: 2014-12-17 — End: 2014-12-18
  Administered 2014-12-17 – 2014-12-18 (×5): 50 mg via ORAL
  Filled 2014-12-17 (×5): qty 1

## 2014-12-17 NOTE — Progress Notes (Signed)
Family Medicine Teaching Service Daily Progress Note Intern Pager: [phone]  Patient name: Rachel Chaney Medical record number: [id number] Date of birth: [birth year] Age: 54 y.o. Gender: female  Primary Care Provider: Marina Goodell, MD Consultants: none Code Status: FULL   Assessment and Plan: Rachel Chaney is a 54 y.o. female presenting with dyspnea, LE edema, and abdominal bloating . PMH is significant for Type II DM with neuropathy, HTN, HLD, and CKD.   Renal US and renal Duplex showed no renal artery stenosis. Labs on admission - Cr 2.32, albumin 2.3, GRF 26, urine creatinine 42, UPEP with no M-spike, complements normal; RPR, HIV, Hep B, Hep C neg. Most recent Cr at 2.65. Per nephrology, patient's nephrotic range proteinuria is consistent with DM, however she needs aggressive diuresis. Lasix naive. IV Lasix was increased to 80 mg 12/7. IV Lasix 80 mg BID started 12/8.   Proteinuria: UA at [facility] ED > 300 protein. Patient also presenting with new onset LE edema and Albumin 2.9. Suspect nephrotic syndrome related to long standing uncontrolled Type II DM and HTN. She reports making urine. Protein creatinine ratio was 6.87. 24-hr urine protein 9000. 2400 mL output yesterday, with net I/O -1700 mL. - Strict I/O's and daily weights - Cont PO Lasix 160 mg BID, as good urine output  HTN: 231/100 in [facility] ED with acute on chronic end organ kidney damage as well as heart failure. Patient taking only Coreg and hydralazine for BP at home. She has not required admission previously for her hypertension. BP in 160s/90-70s overnight. Per nephrology, the patient's severity of HTN is not unusual for patients with advanced CKD and DM. Will not resume enalapril given advanced CKD. Clonidine started yesterday given poor BP control with current med regimen.  - Hold HCTZ due to CKD - Continue hydralazine to 50 mg q6h, continue carvedilol 25 mg BID, clonidine 0.1 mg BID  LE edema: Stable. Patient diuresing well  on IV Lasix 80 mg BID, then transitioned to equivalent PO dose of 160 mg BID on 12/08. - Continue PO lasix 160 mg BID. Increase frequency of dosing as needed.  - Encouraged patient to elevate legs.   Dyspnea: Resolved. Improved with diuresis. Patient reports new onset SOB. BNP elevated to 171.1. Lung exam clear but given new LE edema and cardiomegaly on CXR will evaluate for CHF. Denies chest pain, PND, cough, orthopnea, abdominal distention. Echo shows EF 60-65% with mild LV hypertrophy but no significant valvular abnormalities. Troponins negative x2.  - Continue to monitor   CKD: Suspect worsening of CKD 2/2 to uncontrolled chronic diseases. Baseline Cr 1 year ago was approximately 1.5. Cr 2.35 at admission. Enalapril was discontinued in Oct 2016 in order to avoid further nephrotoxicity during suspected AKI. Patient was also instructed to stop taking nephrotoxic agents and to improve PO hydration. Cr remains stable at 2.63 < 2.65 < 2.50.  - Monitor Cr levels  - Consult dietitian to discuss decreasing sodium intake  Type II DM: A1C 7.2 (decreased from 8.4 on 11/18/14) Takes Lantus 50 units in AM and 30 units PM at home. CBG 245 this morning and 156 overnight.   - CBGs q4 hours  - Hold home Lantus - Moderate SSI with nighttime coverage   Secondary hyperparathyroidism: PTH very elevated at 269.  - Continue calcitriol 0.25 mcg daily  HLD: On Lipitor 80 mg at home. Lipid panel showed slightly decreased HDL at 36 but no other abnormalities.  -Hold home lipitor  Anemia of chronic disease/Leukocytosis: Has seen  oncology previously and her blood work does not suggest any form of myeloproliferative disease. WBC remained elevated at 14.7. Could be related to CKD. Transferrin saturation low at 12%. Patient received Feraheme per nephro recommendations on 12/08. - Continue to monitor  Prolonged QTc: QTc of 508 (12/4) and 509 (12/5).  -Avoid QTc prolonging medications    FEN/GI: Carb Modified  Diet, SLIV  Prophylaxis: Heparin SQ  Disposition: home pending medical improvement   Subjective:  Patient feels well this AM and has no complaints. Denies SOB, chest pain, HA, changes in vision. Reports that she has tried to keep her legs elevated. Would like to go home today.   Objective: Temp:  [98.1 F (36.7 C)-99.2 F (37.3 C)] 98.1 F (36.7 C) (12/09 0546) Pulse Rate:  [75-83] 75 (12/09 0546) Resp:  [18-20] 20 (12/09 0546) BP: (164-189)/(62-80) 170/73 mmHg (12/09 0546) SpO2:  [90 %-94 %] 91 % (12/09 0546) Weight:  [269 lb 13.5 oz (122.4 kg)] 269 lb 13.5 oz (122.4 kg) (12/09 0546) Physical Exam: General: well-appearing female sitting up in bed in NAD, watching TV, legs elevated on bed Cardiovascular: RRR, no murmurs appreciated Respiratory: CTAB, no wheezes, no respiratory distress but decreased breath sounds at bases Abdomen: soft, non-tender, non-distended, +BS Extremities: 2+ pitting edema to knees bilaterally; no TTP of lower extremities  Laboratory:  Recent Labs Lab 12/13/14 1700 12/14/14 0717 12/15/14 1200  WBC 13.9* 15.8* 14.7*  HGB 10.3* 10.0* 9.3*  HCT 35.0* 34.5* 32.5*  PLT 348 356 311    Recent Labs Lab 12/13/14 1700  12/15/14 1200 12/16/14 0600 12/17/14 0730  NA 147*  < > 145 143 141  K 3.3*  < > 3.9 4.3 4.2  CL 113*  < > 111 109 106  CO2 26  < > 27 25 26   BUN 27*  < > 23* 24* 24*  CREATININE 2.35*  < > 2.50* 2.65* 2.63*  CALCIUM 8.4*  < > 8.2* 8.4* 8.3*  PROT 6.4*  --   --   --   --   BILITOT 0.4  --   --   --   --   ALKPHOS 86  --   --   --   --   ALT 23  --   --   --   --   AST 22  --   --   --   --   GLUCOSE 51*  < > 168* 231* 267*  < > = values in this interval not displayed.  Imaging/Diagnostic Tests: Dg Chest 2 View  12/13/2014  CLINICAL DATA:  Acute onset of shortness of breath. Initial encounter. EXAM: CHEST  2 VIEW COMPARISON:  Chest radiograph performed 09/11/2013 FINDINGS: The lungs are well-aerated. Vascular congestion is  noted, with bilateral central airspace opacities, compatible with pulmonary edema. No significant pleural effusions are seen. No pneumothorax is identified. The heart is mildly enlarged. No acute osseous abnormalities are seen. IMPRESSION: Vascular congestion and mild cardiomegaly, with bilateral central airspace opacities, compatible with pulmonary edema. Electronically Signed   By: Garald Balding M.D.   On: 12/13/2014 18:23   US Renal  12/14/2014  CLINICAL DATA:  Acute kidney injury EXAM: RENAL / URINARY TRACT ULTRASOUND COMPLETE COMPARISON:  05/29/2008 FINDINGS: Right Kidney: Length: 12.4 cm. Echogenicity within normal limits. No mass or hydronephrosis visualized. Left Kidney: Length: 12.8 cm. Cyst arising from the inferior pole of the left kidney measures 2.4 x 2.3 x 2.1 cm. Echogenicity within normal limits. No mass or hydronephrosis visualized.  Bladder: Appears normal for degree of bladder distention. IMPRESSION: 1. No obstructive uropathy. 2. Left renal cyst. Electronically Signed   By: Kerby Moors M.D.   On: 12/14/2014 09:22   Verner Mould, MD 12/18/2014, 10:50 AM PGY-1, [place] Intern pager: [phone], text pages welcome

## 2014-12-17 NOTE — Progress Notes (Signed)
[facility] Rounding Note  Subjective:  Appears to be diuresing OK but weight increasing Discussed Na restriction with her yesterday (was having a pretzel snack but said her daughter ate most of the bag and she only ate a little - 2 grams of sodium per bag!) I think would help for dietitian to see  Objective:    Vital signs in last 24 hours: Filed Vitals:   12/16/14 2156 12/17/14 0500 12/17/14 0553 12/17/14 0716  BP: 120/96  172/75 177/76  Pulse: 81  81 81  Temp: 98.6 F (37 C)  98 F (36.7 C)   TempSrc: Oral  Oral   Resp: 20  20   Height:      Weight:  121 kg (266 lb 12.1 oz)    SpO2: 94%  90%    I/O last 3 completed shifts: In: 596 [P.O.:596] Out: 2775 [Urine:2775] Total I/O In: -  Out: 1400 [Urine:1400]    Physical Exam:  Blood pressure 177/76, pulse 81, temperature 98 F (36.7 C), temperature source Oral, resp. rate 20, height 5\' 4"  (1.626 m), weight 121 kg (266 lb 12.1 oz), last menstrual period 09/18/2011, SpO2 90 %. Gen: Obese AAF Very pleasant but dyspneic with moving around on the bed Skin: no rash, cyanosis Neck: Large neck - cannot see neck veins Chest: Crackles both lung bases Heart: S1S2 No audible S3.  No murmur or rub. Abdomen: soft, obese, large pannus.  No abd wall edema. Ext: 2+ edema both LE's Long scar right lateral leg from prior injury  Labs:   Recent Labs Lab 12/13/14 1700 12/14/14 0244 12/15/14 1200 12/16/14 0600 12/17/14 0730  NA 147* 148* 145 143 141  K 3.3* 3.2* 3.9 4.3 4.2  CL 113* 112* 111 109 106  CO2 26 28 27 25 26   GLUCOSE 51* 100* 168* 231* 267*  BUN 27* 24* 23* 24* 24*  CREATININE 2.35* 2.32* 2.50* 2.65* 2.63*  CALCIUM 8.4* 8.5* 8.2* 8.4* 8.3*  PHOS  --  3.5 3.4 3.9 4.0     Recent Labs Lab 12/13/14 1700  12/15/14 1200 12/16/14 0600 12/17/14 0730  AST 22  --   --   --   --   ALT 23  --   --   --   --   ALKPHOS 86  --   --   --   --   BILITOT 0.4  --   --   --   --   PROT 6.4*  --   --   --    --   ALBUMIN 2.9*  < > 2.1* 2.4* 2.4*  < > = values in this interval not displayed.  Recent Labs Lab 12/13/14 1700  LIPASE 22     Recent Labs Lab 12/13/14 1700 12/14/14 0717 12/15/14 1200  WBC 13.9* 15.8* 14.7*  HGB 10.3* 10.0* 9.3*  HCT 35.0* 34.5* 32.5*  MCV 90.9 90.8 92.3  PLT 348 356 311      Recent Labs Lab 12/14/14 0244 12/14/14 0717  TROPONINI <0.03 <0.03     Recent Labs Lab 12/16/14 0758 12/16/14 1149 12/16/14 1650 12/16/14 2155 12/17/14 0745  GLUCAP 193* 182* 202* 156* 245*   Results for MAUREN, MELBY I (MRN [id number]) as of 12/17/2014 10:00  Ref. Range 12/16/2014 06:00  PTH Latest Ref Range: 15-65 pg/mL 269 (H)      Recent Labs Lab 12/16/14 0600  IRON 41  TIBC 329    Medications  . sodium chloride Stopped (12/13/14 1911)   .  carvedilol  25 mg Oral BID WC  . furosemide  80 mg Intravenous Q12H  . heparin  5,000 Units Subcutaneous 3 times per day  . hydrALAZINE  25 mg Oral 4 times per day  . insulin aspart  0-15 Units Subcutaneous TID WC  . insulin aspart  0-5 Units Subcutaneous QHS  . sodium chloride  3 mL Intravenous Q12H    Background 54 y.o. year-old AA female with a background of DM2, HTN, HLD, peripheral neuropathy, retinopathy. We are asked to see her because of CKD and proteinuria. 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 Her workup included HIV, Hep B and C (negative), complements (normal), ANA ordered but not drawn, UPEP with no M-spike, 12.4 and 12.8 cm kidneys by ultrasound, 6.8 grams of proteinuria based on microalb:creatinine ratio. With regard to her HTN she has had a renal artery duplex this admission that by report shows now RAS (I cannot find the report) She presented to the hospital with worsening LE edema, abdominal fullness, SOB and accelerated hypertension. She is said to have had difficult to control HTN historically. Her pre-admission  BP meds were recorded as amlodipine, enalapril, hydralazine 25 TID and carvedilol 50 BID but she was not taking enalapril (states her primary stopped it) or amlodipine. She monitors her BP at home and describes BP's that range from [id number] systolic with diastolics in the 0000000 range. She does not use NSAIDS. She has not been on lasix as an outpt. We were asked to see.   Assessment/Recommendations 1. CKD4 - has progressed over past year as a result of DM and poorly controlled HTN. 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 Reasonable urine output with 80 Q12H IV of lasix. Not sure I believe the net negative balance of 3 liters - does not appear that all po intake is being recorded, and weight increasing (not sure if weights reliable)  Renal function stable past 48 hours. Several options -  I would suggest change to po dosing but increase dose to 160 mg twice a day and assess response 2. Secondary hyperparathyroidism - PTH elevated 269. Recommend adding calcitriol 0.25 mcg daily.  3. Uncontrolled HTN. Renal artery duplex negative for RAS. Room to titrate hydralazine and amlodipine. I think needs up-titration of meds.  May need addition of clonidine. Would not restart her ACE inhibitor given the advanced nature of her CKD.  4. Anemia - likely in part CKD related. Transferrin saturation low at 12%. Would recommend that you replete with Feraheme. (We generally replete CKD pts to TSat of 30% or greater - helps reduce use of/amount of Aranesp or Procrit to maintain Hb >10). If has Hb response to IV Fe may be able to avoid Procrit or Aranesp for a while. 5. SOB - vol overload + uncontrolled HTN. Vascular congestion on adm CXR. Diuresing with lasix 6. DM2 7. HLD 8. Chronic leukocytosis - worked up in the past by heme  My recommendations for adjustments in diuretics, addition of calcitriol, up titration of BP meds, and treatment of low tsat are all outlined above with rationale. I will make an  appointment for her to be followed up at [place] with me after the first of the year.  Please call if you have additional questions regarding her inpatient management.  Renal will sign off at this time.  Jamal Maes, MD [facility] [phone] Pager 12/17/2014, 10:18 AM

## 2014-12-17 NOTE — Progress Notes (Signed)
Family Medicine Teaching Service Daily Progress Note Intern Pager: [phone]  Patient name: Rachel Chaney Medical record number: [id number] Date of birth: 1960/03/03 Age: 54 y.o. Gender: female  Primary Care Provider: Marina Goodell, MD Consultants: none Code Status: FULL   Assessment and Plan: Rachel Chaney is a 54 y.o. female presenting with dyspnea, LE edema, and abdominal bloating . PMH is significant for Type II DM with neuropathy, HTN, HLD, and CKD.   Renal US and renal Duplex showed no renal artery stenosis. Labs on admission - Cr 2.32, albumin 2.3, GRF 26, urine creatinine 42, UPEP with no M-spike, complements normal; RPR, HIV, Hep B, Hep C neg. Cr at 2.65 today (increased from 2.5 yesterday). Per nephrology, patient's nephrotic range proteinuria is consistent with DM, however she needs aggressive diuresis. Lasix naive. IV Lasix was increased to 80 mg 12/7. IV Lasix 80 mg BID started 12/8.   Proteinuria: UA at [facility] ED > 300 protein. Patient also presenting with new onset LE edema and Albumin 2.9. Suspect nephrotic syndrome related to long standing uncontrolled Type II DM and HTN. She reports making urine.  - Protein creatinine ratio was 6.87 - aldosterone + renin pending - PTH pending - 24 hour urine protein 9000 - Strict I/O's and daily weights: 0.9 L out, net negative 0.5 L - Cont IV Lasix at 80 mg BID, as good urine output  HTN: 231/100 in [facility] ED with acute on chronic end organ kidney damage as well as heart failure. Patient taking only Coreg and hydralazine for BP at home. She has not required admission previously for her hypertension. BP in 160s/90-70s overnight. Per nephrology, the patient's severity of HTN is not unusual for patients with advanced CKD and DM. Will not resume enalapril given advanced CKD. - Per nephrology, hydralazine can be titrated up if necessary. Can add clonidine if uncontrolled with two meds.  - Hold HCTZ due to CKD - Continue Coreg 25 mg BID,  increase hydralazine to 50 mg q6h, continue carvedilol 25 mg BID. Start clonidine 0.1 mg BID if BPs still poorly controlled.   LE edema: Stable. - Transition to PO lasix 160 mg BID from IV lasix 80 mg BID. Increase frequency of dosing as needed.  - Encouraged patient to elevate legs.   Dyspnea: Resolved. Improved with diuresis. Patient reports new onset SOB. BNP elevated to 171.1. Lung exam clear but given new LE edema and cardiomegaly on CXR will evaluate for CHF. Denies chest pain, PND, cough, orthopnea, abdominal distention. Echo shows EF 60-65% with mild LV hypertrophy but no significant valvular abnormalities. Troponins negative x2.  - Continue to monitor   CKD: Suspect worsening of CKD 2/2 to uncontrolled chronic diseases. Baseline Cr 1 year ago was approximately 1.5. Cr 2.35 at admission. Enalapril was discontinued in Oct 2016 in order to avoid further nephrotoxicity during suspected AKI. Patient was also instructed to stop taking nephrotoxic agents and to improve PO hydration.   - Monitor Cr levels  - SCr stable at 2.63 < 2.65 < 2.50 - Calcitriol 0.25 mcg daily  Type II DM: A1C 7.2 (decreased from 8.4 on 11/18/14) Takes Lantus 50 units in AM and 30 units PM at home. CBG 245 this morning and 156 overnight.   - CBGs q4 hours  - Hold home Lantus - Moderate SSI with nighttime coverage   HLD: On Lipitor 80 mg at home. Lipid panel showed slightly decreased HDL at 36 but no other abnormalities.  -Hold home lipitor  Anemia of chronic disease/Leukocytosis:  Has seen oncology previously and her blood work does not suggest any form of myeloproliferative disease. WBC remained elevated at 14.7. Could be related to CKD. Transferrin saturation low at 12%. - Continue to monitor - Replete with Feraheme 12/8 per renal  Prolonged QTc: QTc of 508 (12/4) and 509 (12/5).  -Avoid QTc prolonging medications    FEN/GI: Carb Modified Diet, SLIV  Prophylaxis: Heparin SQ  Disposition: home pending  medical improvement   Subjective:  - No SOB - Thinks swelling has improved  Objective: Temp:  [98 F (36.7 C)-98.6 F (37 C)] 98 F (36.7 C) (12/08 0553) Pulse Rate:  [81-82] 81 (12/08 0716) Resp:  [20] 20 (12/08 0553) BP: (120-177)/(70-96) 177/76 mmHg (12/08 0716) SpO2:  [90 %-94 %] 90 % (12/08 0553) Weight:  [266 lb 12.1 oz (121 kg)] 266 lb 12.1 oz (121 kg) (12/08 0500) Physical Exam: General: well-appearing female lying in bed in NAD, watching TV Cardiovascular: RRR, no murmurs appreciated Respiratory: CTAB, no wheezes, no respiratory distress but decreased breath sounds at bases Abdomen: soft, non-tender, non-distended Extremities: 2+ pitting edema to knees bilaterally; no TTP of lower extremities  Laboratory:  Recent Labs Lab 12/13/14 1700 12/14/14 0717 12/15/14 1200  WBC 13.9* 15.8* 14.7*  HGB 10.3* 10.0* 9.3*  HCT 35.0* 34.5* 32.5*  PLT 348 356 311    Recent Labs Lab 12/13/14 1700 12/14/14 0244 12/15/14 1200 12/16/14 0600  NA 147* 148* 145 143  K 3.3* 3.2* 3.9 4.3  CL 113* 112* 111 109  CO2 26 28 27 25   BUN 27* 24* 23* 24*  CREATININE 2.35* 2.32* 2.50* 2.65*  CALCIUM 8.4* 8.5* 8.2* 8.4*  PROT 6.4*  --   --   --   BILITOT 0.4  --   --   --   ALKPHOS 86  --   --   --   ALT 23  --   --   --   AST 22  --   --   --   GLUCOSE 51* 100* 168* 231*    Imaging/Diagnostic Tests: Dg Chest 2 View  12/13/2014  CLINICAL DATA:  Acute onset of shortness of breath. Initial encounter. EXAM: CHEST  2 VIEW COMPARISON:  Chest radiograph performed 09/11/2013 FINDINGS: The lungs are well-aerated. Vascular congestion is noted, with bilateral central airspace opacities, compatible with pulmonary edema. No significant pleural effusions are seen. No pneumothorax is identified. The heart is mildly enlarged. No acute osseous abnormalities are seen. IMPRESSION: Vascular congestion and mild cardiomegaly, with bilateral central airspace opacities, compatible with pulmonary edema.  Electronically Signed   By: Garald Balding M.D.   On: 12/13/2014 18:23   US Renal  12/14/2014  CLINICAL DATA:  Acute kidney injury EXAM: RENAL / URINARY TRACT ULTRASOUND COMPLETE COMPARISON:  05/29/2008 FINDINGS: Right Kidney: Length: 12.4 cm. Echogenicity within normal limits. No mass or hydronephrosis visualized. Left Kidney: Length: 12.8 cm. Cyst arising from the inferior pole of the left kidney measures 2.4 x 2.3 x 2.1 cm. Echogenicity within normal limits. No mass or hydronephrosis visualized. Bladder: Appears normal for degree of bladder distention. IMPRESSION: 1. No obstructive uropathy. 2. Left renal cyst. Electronically Signed   By: Kerby Moors M.D.   On: 12/14/2014 09:22   Brendin Situ Corinda Gubler, MD 12/17/2014, 7:38 AM PGY-1, [place] Intern pager: [phone], text pages welcome

## 2014-12-18 LAB — RENAL FUNCTION PANEL
Albumin: 2.2 g/dL — ABNORMAL LOW (ref 3.5–5.0)
Anion gap: 7 (ref 5–15)
BUN: 24 mg/dL — ABNORMAL HIGH (ref 6–20)
CO2: 28 mmol/L (ref 22–32)
Calcium: 8.3 mg/dL — ABNORMAL LOW (ref 8.9–10.3)
Chloride: 108 mmol/L (ref 101–111)
Creatinine, Ser: 2.73 mg/dL — ABNORMAL HIGH (ref 0.44–1.00)
GFR calc Af Amer: 21 mL/min — ABNORMAL LOW (ref >60)
GFR calc non Af Amer: 18 mL/min — ABNORMAL LOW (ref >60)
Glucose, Bld: 190 mg/dL — ABNORMAL HIGH (ref 65–99)
Phosphorus: 3.6 mg/dL (ref 2.5–4.6)
Potassium: 3.6 mmol/L (ref 3.5–5.1)
Sodium: 143 mmol/L (ref 135–145)

## 2014-12-18 LAB — GLUCOSE, CAPILLARY
Glucose-Capillary: 183 mg/dL — ABNORMAL HIGH (ref 65–99)
Glucose-Capillary: 181 mg/dL — ABNORMAL HIGH (ref 65–99)

## 2014-12-18 MED ORDER — HYDRALAZINE HCL 50 MG PO TABS: 50.0000 mg | ORAL_TABLET | Freq: Four times a day (QID) | ORAL | Status: DC

## 2014-12-18 MED ORDER — CLONIDINE HCL 0.1 MG PO TABS: 0.1000 mg | ORAL_TABLET | Freq: Two times a day (BID) | ORAL | Status: DC

## 2014-12-18 MED ORDER — CALCITRIOL 0.25 MCG PO CAPS: 0.2500 ug | ORAL_CAPSULE | Freq: Every day | ORAL | Status: DC

## 2014-12-18 MED ORDER — FUROSEMIDE 80 MG PO TABS: 160.0000 mg | ORAL_TABLET | Freq: Two times a day (BID) | ORAL | Status: DC

## 2014-12-18 NOTE — Discharge Instructions (Signed)
Rachel Chaney, Lucas were admitted to the hospital for severely elevated blood pressures, protein in your urine, and shortness of breath. For both your blood pressure and kidney disease, your blood pressure medications were adjusted, and you were started on a pill to decrease fluid retention.   Hypertension Hypertension, commonly called high blood pressure, is when the force of blood pumping through your arteries is too strong. Your arteries are the blood vessels that carry blood from your heart throughout your body. A blood pressure reading consists of a higher number over a lower number, such as 110/72. The higher number (systolic) is the pressure inside your arteries when your heart pumps. The lower number (diastolic) is the pressure inside your arteries when your heart relaxes. Ideally you want your blood pressure below 120/80. Hypertension forces your heart to work harder to pump blood. Your arteries may become narrow or stiff. Having untreated or uncontrolled hypertension can cause heart attack, stroke, kidney disease, and other problems. RISK FACTORS Some risk factors for high blood pressure are controllable. Others are not.  Risk factors you cannot control include:   Race. You may be at higher risk if you are African American.  Age. Risk increases with age.  Gender. Men are at higher risk than women before age 109 years. After age 50, women are at higher risk than men. Risk factors you can control include:  Not getting enough exercise or physical activity.  Being overweight.  Getting too much fat, sugar, calories, or salt in your diet.  Drinking too much alcohol. SIGNS AND SYMPTOMS Hypertension does not usually cause signs or symptoms. Extremely high blood pressure (hypertensive crisis) may cause headache, anxiety, shortness of breath, and nosebleed. DIAGNOSIS To check if you have hypertension, your health care provider will measure your blood pressure while you are seated, with your  arm held at the level of your heart. It should be measured at least twice using the same arm. Certain conditions can cause a difference in blood pressure between your right and left arms. A blood pressure reading that is higher than normal on one occasion does not mean that you need treatment. If it is not clear whether you have high blood pressure, you may be asked to return on a different day to have your blood pressure checked again. Or, you may be asked to monitor your blood pressure at home for 1 or more weeks. TREATMENT Treating high blood pressure includes making lifestyle changes and possibly taking medicine. Living a healthy lifestyle can help lower high blood pressure. You may need to change some of your habits. Lifestyle changes may include:  Following the DASH diet. This diet is high in fruits, vegetables, and whole grains. It is low in salt, red meat, and added sugars.  Keep your sodium intake below 2,300 mg per day.  Getting at least 30-45 minutes of aerobic exercise at least 4 times per week.  Losing weight if necessary.  Not smoking.  Limiting alcoholic beverages.  Learning ways to reduce stress. Your health care provider may prescribe medicine if lifestyle changes are not enough to get your blood pressure under control, and if one of the following is true:  You are 71-76 years of age and your systolic blood pressure is above 140.  You are 40 years of age or older, and your systolic blood pressure is above 150.  Your diastolic blood pressure is above 90.  You have diabetes, and your systolic blood pressure is over [id number] or your diastolic  blood pressure is over 90.  You have kidney disease and your blood pressure is above 140/90.  You have heart disease and your blood pressure is above 140/90. Your personal target blood pressure may vary depending on your medical conditions, your age, and other factors. HOME CARE INSTRUCTIONS  Have your blood pressure rechecked as  directed by your health care provider.   Take medicines only as directed by your health care provider. Follow the directions carefully. Blood pressure medicines must be taken as prescribed. The medicine does not work as well when you skip doses. Skipping doses also puts you at risk for problems.  Do not smoke.   Monitor your blood pressure at home as directed by your health care provider. SEEK MEDICAL CARE IF:   You think you are having a reaction to medicines taken.  You have recurrent headaches or feel dizzy.  You have swelling in your ankles.  You have trouble with your vision. SEEK IMMEDIATE MEDICAL CARE IF:  You develop a severe headache or confusion.  You have unusual weakness, numbness, or feel faint.  You have severe chest or abdominal pain.  You vomit repeatedly.  You have trouble breathing. MAKE SURE YOU:   Understand these instructions.  Will watch your condition.  Will get help right away if you are not doing well or get worse.   This information is not intended to replace advice given to you by your health care provider. Make sure you discuss any questions you have with your health care provider.   Document Released: 12/26/2004 Document Revised: 05/12/2014 Document Reviewed: 10/18/2012 Elsevier Interactive Patient Education [employer].

## 2014-12-18 NOTE — Care Management Note (Signed)
Case Management Note  Patient Details  Name: DYNASIA WATRING MRN: [id number] Date of Birth: Jan 15, 1960  Subjective/Objective:                 Independent patient from home lives with spouse admitted for nephrotic syndrome. Will DC to home self care   Action/Plan:  No CM needs identified a this time. Will DC to home today self care. Expected Discharge Date:                  Expected Discharge Plan:  Home/Self Care  In-House Referral:     Discharge planning Services  CM Consult  Post Acute Care Choice:    Choice offered to:     DME Arranged:    DME Agency:     HH Arranged:    [place] Agency:     Status of Service:  Completed, signed off  Medicare Important Message Given:    Date Medicare IM Given:    Medicare IM give by:    Date Additional Medicare IM Given:    Additional Medicare Important Message give by:     If discussed at [place] of Stay Meetings, dates discussed:    Additional Comments:  Carles Collet, RN 12/18/2014, 4:19 PM

## 2014-12-18 NOTE — Progress Notes (Signed)
SATURATION QUALIFICATIONS: (This note is used to comply with regulatory documentation for home oxygen)  Patient Saturations on Room Air at Rest = 97  Patient Saturations on Room Air while Ambulating = 93%  Patient Saturations on 2 Liters of oxygen while Ambulating = 96%  Please briefly explain why patient needs home oxygen:

## 2014-12-18 NOTE — Progress Notes (Signed)
Patient was discharged home by MD order; discharged instructions  review and give to patient with care notes; IV DIC; skin intact; patient will be escorted to the car by nurse tech via wheelchair.  

## 2014-12-19 ENCOUNTER — Other Ambulatory Visit: Payer: Self-pay | Admitting: Family Medicine

## 2014-12-22 ENCOUNTER — Inpatient Hospital Stay: Payer: 59 | Admitting: Family Medicine

## 2014-12-28 NOTE — Telephone Encounter (Signed)
Pt is checking on the status of her refill for Carvedilol. The request was sent in on 12/19/14, and she has been out for 2 days. Please respond post haste and send to [facility] on [address]. Thank you, Fonda Kinder, ASA

## 2014-12-29 ENCOUNTER — Telehealth: Payer: Self-pay | Admitting: Student

## 2014-12-29 NOTE — Telephone Encounter (Signed)
Coreg refilled.

## 2014-12-29 NOTE — Telephone Encounter (Signed)
cardevilol is suppose to be 50 mg not the 25 mg

## 2014-12-29 NOTE — Telephone Encounter (Signed)
Will forward to MD to advise. Fiora Weill,CMA  

## 2014-12-30 MED ORDER — CARVEDILOL 25 MG PO TABS: 50.0000 mg | ORAL_TABLET | Freq: Two times a day (BID) | ORAL | Status: DC

## 2014-12-30 NOTE — Telephone Encounter (Signed)
Coreg 50 mg BID ordered

## 2015-01-07 ENCOUNTER — Ambulatory Visit: Payer: 59 | Admitting: Student

## 2015-01-18 ENCOUNTER — Ambulatory Visit: Payer: 59 | Admitting: Student

## 2015-02-16 ENCOUNTER — Inpatient Hospital Stay: Payer: 59 | Admitting: Student

## 2015-02-18 ENCOUNTER — Other Ambulatory Visit: Payer: Self-pay | Admitting: Internal Medicine

## 2015-02-18 ENCOUNTER — Encounter: Payer: Self-pay | Admitting: Student

## 2015-02-18 ENCOUNTER — Ambulatory Visit (INDEPENDENT_AMBULATORY_CARE_PROVIDER_SITE_OTHER): Payer: Commercial Managed Care - HMO | Admitting: Student

## 2015-02-18 VITALS — BP 221/86 | HR 85 | Temp 98.6°F | Wt 252.9 lb

## 2015-02-18 DIAGNOSIS — Z1239 Encounter for other screening for malignant neoplasm of breast: Secondary | ICD-10-CM | POA: Diagnosis not present

## 2015-02-18 DIAGNOSIS — I10 Essential (primary) hypertension: Secondary | ICD-10-CM | POA: Diagnosis not present

## 2015-02-18 DIAGNOSIS — Z0000 Encounter for general adult medical examination without abnormal findings: Secondary | ICD-10-CM | POA: Diagnosis not present

## 2015-02-18 MED ORDER — CARVEDILOL 25 MG PO TABS: 50.0000 mg | ORAL_TABLET | Freq: Two times a day (BID) | ORAL | Status: DC

## 2015-02-18 MED ORDER — CLONIDINE HCL 0.1 MG PO TABS: 0.1000 mg | ORAL_TABLET | Freq: Two times a day (BID) | ORAL | Status: DC

## 2015-02-18 MED ORDER — FUROSEMIDE 80 MG PO TABS: 160.0000 mg | ORAL_TABLET | Freq: Two times a day (BID) | ORAL | Status: DC

## 2015-02-18 NOTE — Assessment & Plan Note (Signed)
Continue current Lantus regimen - We will check A1c in one month

## 2015-02-18 NOTE — Progress Notes (Signed)
   Subjective:    Patient ID: Early Chars, female    DOB: 08/15/1960, 55 y.o.   MRN: [id number]   CC: Hospital follow-up for hypertension  HPI: 66 reveal female with poorly controlled hypertension, diabetes presents for hospital follow-up  Diabetes - A1c in the hospital on 12/6 was 7.2 - She continues to be managed with Lantus 15 the morning and 30 in the evening -She denies chest pain, shortness of breath  Hypertension - She reports she is taking clonidine once a day, carvedilol 25 mg twice a day, Lasix 80 mg twice a day - Upon leaving the hospital she was discharged with clonidine twice a day, carvedilol 50 twice a day, Lasix 160 twice a day - Since leaving she denies lower extremity edema, headache, changes in vision  Review of Systems ROS  Per the history of present illness otherwise she denies recent  Past illness, fever, nausea, vomiting, diarrhea, weakness,  Medical, Surgical, Social, and Family History Reviewed & Updated per EMR.   Objective:  BP 221/86 mmHg  Pulse 85  Temp(Src) 98.6 F (37 C) (Oral)  Wt 252 lb 14.4 oz (114.715 kg)  LMP 09/18/2011 Vitals and nursing note reviewed  General: NAD Cardiac: RRR,  Respiratory: CTAB, normal effort Extremities: no edema or cyanosis. WWP. Skin: warm and dry, no rashes noted Neuro: alert and oriented, no focal deficits   Assessment & Plan:    Diabetes mellitus with neuropathy Continue current Lantus regimen - We will check A1c in one month  Essential hypertension Hypertension continues to be poorly controlled with initial blood pressure reading in the office 240/113 repeat was 221/86. She has not been adherent to her blood pressure regimen. Significantly she does not have symptoms with these pressures - Will follow closely and repeat blood pressure in one week - Antihypertensive regimen reviewed with patient and she was strongly encouraged to  take them as prescribed - ED precautions were reviewed   Health  care maintenance Mammogram ordered. Patient was encouraged to schedule colonoscopy as she has not had one wet - Pap at next visit     Rachel Chaney Brigham MD, [place] [facility] Resident PGY-2 Pager [phone]

## 2015-02-18 NOTE — Assessment & Plan Note (Signed)
Hypertension continues to be poorly controlled with initial blood pressure reading in the office 240/113 repeat was 221/86. She has not been adherent to her blood pressure regimen. Significantly she does not have symptoms with these pressures - Will follow closely and repeat blood pressure in one week - Antihypertensive regimen reviewed with patient and she was strongly encouraged to  take them as prescribed - ED precautions were reviewed

## 2015-02-18 NOTE — Assessment & Plan Note (Addendum)
Mammogram ordered. Patient was encouraged to schedule colonoscopy as she has not had one wet - Pap at next visit

## 2015-02-18 NOTE — Patient Instructions (Addendum)
Follow-up in one week for blood pressure check Please continue carvedilol 50 mg ( two 25 mg tablets)  twice daily, clonidine twice daily, furosemide 160 mg ( two 80 mg tablets)  twice daily If you have chest pain, shortness of breath, headache, worsening swelling go to emergency room for evaluation If you have any questions or concerns please call the office at [phone]

## 2015-04-08 ENCOUNTER — Telehealth: Payer: Self-pay | Admitting: *Deleted

## 2015-04-08 NOTE — Telephone Encounter (Signed)
Patient called stating she need something for back pain and stomach bloating.  Advised patient she should schedule an appointment to be assessed.  Appointment scheduled for 04/12/15 at 11 AM. Derl Barrow, RN

## 2015-04-11 ENCOUNTER — Inpatient Hospital Stay (HOSPITAL_COMMUNITY)
Admission: EM | Admit: 2015-04-11 | Discharge: 2015-04-19 | DRG: 252 | Disposition: A | Discharge status: Home / Self Care | Payer: Commercial Managed Care - HMO | Attending: Family Medicine | Admitting: Family Medicine

## 2015-04-11 ENCOUNTER — Emergency Department (HOSPITAL_COMMUNITY): Payer: Commercial Managed Care - HMO

## 2015-04-11 DIAGNOSIS — N289 Disorder of kidney and ureter, unspecified: Secondary | ICD-10-CM | POA: Diagnosis present

## 2015-04-11 DIAGNOSIS — E1165 Type 2 diabetes mellitus with hyperglycemia: Secondary | ICD-10-CM | POA: Diagnosis present

## 2015-04-11 DIAGNOSIS — Z8249 Family history of ischemic heart disease and other diseases of the circulatory system: Secondary | ICD-10-CM

## 2015-04-11 DIAGNOSIS — N186 End stage renal disease: Secondary | ICD-10-CM | POA: Diagnosis present

## 2015-04-11 DIAGNOSIS — R680 Hypothermia, not associated with low environmental temperature: Secondary | ICD-10-CM | POA: Diagnosis present

## 2015-04-11 DIAGNOSIS — E669 Obesity, unspecified: Secondary | ICD-10-CM | POA: Diagnosis present

## 2015-04-11 DIAGNOSIS — E11649 Type 2 diabetes mellitus with hypoglycemia without coma: Secondary | ICD-10-CM | POA: Diagnosis present

## 2015-04-11 DIAGNOSIS — R0602 Shortness of breath: Secondary | ICD-10-CM | POA: Diagnosis not present

## 2015-04-11 DIAGNOSIS — I132 Hypertensive heart and chronic kidney disease with heart failure and with stage 5 chronic kidney disease, or end stage renal disease: Principal | ICD-10-CM | POA: Diagnosis present

## 2015-04-11 DIAGNOSIS — E1121 Type 2 diabetes mellitus with diabetic nephropathy: Secondary | ICD-10-CM | POA: Diagnosis present

## 2015-04-11 DIAGNOSIS — I5032 Chronic diastolic (congestive) heart failure: Secondary | ICD-10-CM | POA: Insufficient documentation

## 2015-04-11 DIAGNOSIS — I5031 Acute diastolic (congestive) heart failure: Secondary | ICD-10-CM | POA: Diagnosis present

## 2015-04-11 DIAGNOSIS — K219 Gastro-esophageal reflux disease without esophagitis: Secondary | ICD-10-CM | POA: Diagnosis present

## 2015-04-11 DIAGNOSIS — Z09 Encounter for follow-up examination after completed treatment for conditions other than malignant neoplasm: Secondary | ICD-10-CM

## 2015-04-11 DIAGNOSIS — R188 Other ascites: Secondary | ICD-10-CM | POA: Diagnosis present

## 2015-04-11 DIAGNOSIS — E162 Hypoglycemia, unspecified: Secondary | ICD-10-CM | POA: Diagnosis present

## 2015-04-11 DIAGNOSIS — I10 Essential (primary) hypertension: Secondary | ICD-10-CM | POA: Diagnosis present

## 2015-04-11 DIAGNOSIS — N184 Chronic kidney disease, stage 4 (severe): Secondary | ICD-10-CM

## 2015-04-11 DIAGNOSIS — Z794 Long term (current) use of insulin: Secondary | ICD-10-CM

## 2015-04-11 DIAGNOSIS — E785 Hyperlipidemia, unspecified: Secondary | ICD-10-CM | POA: Diagnosis present

## 2015-04-11 DIAGNOSIS — E1122 Type 2 diabetes mellitus with diabetic chronic kidney disease: Secondary | ICD-10-CM | POA: Diagnosis present

## 2015-04-11 DIAGNOSIS — I509 Heart failure, unspecified: Secondary | ICD-10-CM

## 2015-04-11 DIAGNOSIS — I4581 Long QT syndrome: Secondary | ICD-10-CM | POA: Diagnosis present

## 2015-04-11 DIAGNOSIS — D72829 Elevated white blood cell count, unspecified: Secondary | ICD-10-CM | POA: Diagnosis present

## 2015-04-11 DIAGNOSIS — N179 Acute kidney failure, unspecified: Secondary | ICD-10-CM | POA: Diagnosis present

## 2015-04-11 DIAGNOSIS — E870 Hyperosmolality and hypernatremia: Secondary | ICD-10-CM | POA: Diagnosis present

## 2015-04-11 DIAGNOSIS — Z6841 Body Mass Index (BMI) 40.0 and over, adult: Secondary | ICD-10-CM

## 2015-04-11 DIAGNOSIS — N2581 Secondary hyperparathyroidism of renal origin: Secondary | ICD-10-CM | POA: Diagnosis present

## 2015-04-11 LAB — CBG MONITORING, ED: Glucose-Capillary: 41 mg/dL — CL (ref 65–99)

## 2015-04-11 LAB — I-STAT TROPONIN, ED: Troponin i, poc: 0.04 ng/mL (ref 0.00–0.08)

## 2015-04-11 MED ORDER — ALBUTEROL SULFATE (2.5 MG/3ML) 0.083% IN NEBU
5.0000 mg | INHALATION_SOLUTION | Freq: Once | RESPIRATORY_TRACT | Status: AC
  Administered 2015-04-12: 5 mg via RESPIRATORY_TRACT
  Filled 2015-04-11: qty 6

## 2015-04-11 NOTE — ED Notes (Signed)
Bed: RESB Expected date:  Expected time:  Means of arrival:  Comments: 72 F sob/back pain

## 2015-04-11 NOTE — ED Provider Notes (Addendum)
CSN: [id number]     Arrival date & time 04/11/15  2236 History  By signing my name below, I, Irene Pap, attest that this documentation has been prepared under the direction and in the presence of Lacretia Leigh, MD. Electronically Signed: Irene Pap, ED Scribe. 04/11/2015. 11:09 PM.  Chief Complaint  Patient presents with  . Shortness of Breath  . Back Pain  . Ascites  . Hypoglycemia   The history is provided by the patient. No language interpreter was used.  HPI Comments: Rachel Chaney is a 55 y.o. female with a hx of DM and HTN who presents to the Emergency Department complaining of SOB onset PTA. Pt reports lower back "pressure," diaphoresis, and decreased CBG. Pt states that she was shaking due to her lowered blood sugar. She reports worsening SOB with walking around and relief with resting. Pt reports bilateral leg swelling that has been ongoing for a few months. Pt had a recent change in her Lantus. Pt did not take anything for her SOB prior to EMS arrival. Pt was given 2L O2, which raised her SPO2 to 94%. She does not use oxygen at home. She denies fever, chills, nausea, vomiting, or chest pain.   Past Medical History  Diagnosis Date  . Diabetes mellitus   . Hyperlipidemia   . Hypertension    Past Surgical History  Procedure Laterality Date  . Eye surgery Right     retinal detachment   Family History  Problem Relation Age of Onset  . Heart disease Mother   . Heart disease Father    Social History  Substance Use Topics  . Smoking status: Never Smoker   . Smokeless tobacco: Never Used  . Alcohol Use: No   OB History    No data available     Review of Systems  Constitutional: Negative for fever and chills.  Respiratory: Positive for shortness of breath.   Cardiovascular: Positive for leg swelling. Negative for chest pain.  Gastrointestinal: Negative for nausea, vomiting, abdominal pain and diarrhea.  Musculoskeletal: Positive for back pain.  All other  systems reviewed and are negative.  Allergies  Review of patient's allergies indicates no known allergies.  Home Medications   Prior to Admission medications   Medication Sig Start Date End Date Taking? Authorizing Provider  acetaminophen (TYLENOL) 500 MG tablet Take 1,000 mg by mouth every 6 (six) hours as needed for pain.    Historical Provider, MD  atorvastatin (LIPITOR) 80 MG tablet Take 1 tablet (80 mg total) by mouth daily. 08/06/14   Alyssa A Haney, MD  calcitRIOL (ROCALTROL) 0.25 MCG capsule TAKE 1 CAPSULE (0.25 MCG TOTAL) BY MOUTH DAILY. 02/19/15   Veatrice Bourbon, MD  carvedilol (COREG) 25 MG tablet Take 2 tablets (50 mg total) by mouth 2 (two) times daily with a meal. 02/18/15   Alyssa A Haney, MD  cetirizine (ZYRTEC) 10 MG tablet Take 10 mg by mouth daily as needed for allergies.    Historical Provider, MD  cloNIDine (CATAPRES) 0.1 MG tablet Take 1 tablet (0.1 mg total) by mouth 2 (two) times daily. 02/18/15   Veatrice Bourbon, MD  diclofenac sodium (VOLTAREN) 1 % GEL Apply 2 g topically 4 (four) times daily. Patient not taking: Reported on 02/18/2015 05/21/14   Frazier Richards, MD  furosemide (LASIX) 80 MG tablet Take 2 tablets (160 mg total) by mouth 2 (two) times daily. 02/18/15   Alyssa A Haney, MD  glucose blood (ONE TOUCH ULTRA TEST) test strip  Use as instructed 10/27/14   Veatrice Bourbon, MD  hydrALAZINE (APRESOLINE) 50 MG tablet Take 1 tablet (50 mg total) by mouth every 6 (six) hours. 12/18/14   Hillary Corinda Gubler, MD  Insulin Pen Needle 29G X 12MM MISC Inject 1 pen into the skin 2 (two) times daily. Check blood sugar daily. 04/29/13   Waldemar Dickens, MD  Insulin Pen Needle 31G X 5 MM MISC Use as directed for insulin injection bid 01/07/14   Dickie La, MD  LANTUS SOLOSTAR 100 UNIT/ML Solostar Pen INJECT 50 UNITS IN THE MORNING AND 30 UNITS IN THE EVENING 07/21/14   Veatrice Bourbon, MD  omeprazole (PRILOSEC) 20 MG capsule Take 1 capsule (20 mg total) by mouth daily as needed (acid  reflux). Patient not taking: Reported on 02/18/2015 12/09/13   [name], MD  [facility] DELICA LANCETS [id number] MISC 1 Device by Does not apply route as needed (to check blood glucose). 05/02/13   Waldemar Dickens, MD  Syringe, Disposable, 1 ML MISC Use as directed 07/01/14   Veatrice Bourbon, MD   BP 227/98 mmHg  Pulse 70  Resp 22  SpO2 98%  LMP 09/18/2011 Physical Exam  Constitutional: She is oriented to person, place, and time. She appears well-developed and well-nourished.  Non-toxic appearance. No distress.  HENT:  Head: Normocephalic and atraumatic.  Eyes: EOM and lids are normal. Pupils are equal, round, and reactive to light.  Pale conjunctiva bilaterally  Neck: Normal range of motion. Neck supple. No tracheal deviation present. No thyroid mass present.  Cardiovascular: Normal rate, regular rhythm and normal heart sounds.  Exam reveals no gallop.   No murmur heard. Pulmonary/Chest: Effort normal and breath sounds normal. No stridor. No respiratory distress. She has no decreased breath sounds. She has no wheezes. She has no rhonchi. She has no rales.  Crackles at bases bilateral  Abdominal: Soft. Normal appearance and bowel sounds are normal. She exhibits distension. There is no tenderness. There is no rebound and no CVA tenderness.  Musculoskeletal: Normal range of motion. She exhibits no edema or tenderness.  3+ pitting edema bilaterally  Neurological: She is alert and oriented to person, place, and time. She has normal strength. No cranial nerve deficit or sensory deficit. GCS eye subscore is 4. GCS verbal subscore is 5. GCS motor subscore is 6.  Skin: Skin is warm and dry. No abrasion and no rash noted. There is pallor.  Psychiatric: She has a normal mood and affect. Her speech is normal and behavior is normal.  Nursing note and vitals reviewed.   ED Course  Procedures (including critical care time) DIAGNOSTIC STUDIES: Oxygen Saturation is 98% on RA, normal by my interpretation.     COORDINATION OF CARE: 11:17 PM-Discussed treatment plan which includes labs with pt at bedside and pt agreed to plan.    Labs Review Labs Reviewed  CBG MONITORING, ED - Abnormal; Notable for the following:    Glucose-Capillary 41 (*)    All other components within normal limits  CBC  I-STAT TROPOININ, ED    Imaging Review No results found. I have personally reviewed and evaluated these images and lab results as part of my medical decision-making.   EKG Interpretation None    ED ECG REPORT     EKG Interpretation  Date/Time:  Sunday April 11 2015 22:40:36 EDT Ventricular Rate:  71 PR Interval:  165 QRS Duration: 99 QT Interval:  501 QTC Calculation: 544 R Axis:   16  Text Interpretation:  Sinus rhythm Low voltage, precordial leads Prolonged QT interval Baseline wander in lead(s) V2 No significant change since last tracing Confirmed by Gaige Fussner  MD, Cyrena Kuchenbecker ([id number]) on 04/12/2015 1:35:07 AM          MDM   Final diagnoses:  None   I personally performed the services described in this documentation, which was scribed in my presence. The recorded information has been reviewed and is accurate.  Patient with hypoglycemia this evening and was treated with orange juice.  She was then given an amp of D50. Hypothermia noted and likely from her hyperglycemia. Patient given IV Lasix 80 mg IV push. Will be admitted for treatment of her CHF  CRITICAL CARE Performed by: Leota Jacobsen Total critical care time: 45 minutes Critical care time was exclusive of separately billable procedures and treating other patients. Critical care was necessary to treat or prevent imminent or life-threatening deterioration. Critical care was time spent personally by me on the following activities: development of treatment plan with patient and/or surrogate as well as nursing, discussions with consultants, evaluation of patient's response to treatment, examination of patient, obtaining history from  patient or surrogate, ordering and performing treatments and interventions, ordering and review of laboratory studies, ordering and review of radiographic studies, pulse oximetry and re-evaluation of patient's condition.   Lacretia Leigh, MD 04/12/15 [id number]  Lacretia Leigh, MD 04/12/15 [phone]

## 2015-04-11 NOTE — ED Notes (Signed)
Patient transported to X-ray 

## 2015-04-11 NOTE — ED Notes (Signed)
According to EMS, pt c/o SOB and dyspnea. EMS reports 90% on RA, labored breathing, and lower back "pressure". Pt placed on 2L via  raising SPO2 to 94%. Pt arrives A+OX4, speaking in complete sentences, BSG 41.

## 2015-04-12 ENCOUNTER — Inpatient Hospital Stay (HOSPITAL_BASED_OUTPATIENT_CLINIC_OR_DEPARTMENT_OTHER): Payer: Commercial Managed Care - HMO

## 2015-04-12 ENCOUNTER — Inpatient Hospital Stay (HOSPITAL_COMMUNITY): Payer: Commercial Managed Care - HMO

## 2015-04-12 ENCOUNTER — Encounter (HOSPITAL_COMMUNITY): Payer: Self-pay

## 2015-04-12 ENCOUNTER — Ambulatory Visit: Payer: 59 | Admitting: Student

## 2015-04-12 DIAGNOSIS — N184 Chronic kidney disease, stage 4 (severe): Secondary | ICD-10-CM

## 2015-04-12 DIAGNOSIS — D72829 Elevated white blood cell count, unspecified: Secondary | ICD-10-CM | POA: Diagnosis not present

## 2015-04-12 DIAGNOSIS — E162 Hypoglycemia, unspecified: Secondary | ICD-10-CM | POA: Diagnosis present

## 2015-04-12 DIAGNOSIS — I5031 Acute diastolic (congestive) heart failure: Secondary | ICD-10-CM | POA: Diagnosis not present

## 2015-04-12 DIAGNOSIS — I509 Heart failure, unspecified: Secondary | ICD-10-CM

## 2015-04-12 DIAGNOSIS — N179 Acute kidney failure, unspecified: Secondary | ICD-10-CM | POA: Diagnosis present

## 2015-04-12 LAB — CBC
HCT: 35 % — ABNORMAL LOW (ref 36.0–46.0)
HCT: 33.4 % — ABNORMAL LOW (ref 36.0–46.0)
Hemoglobin: 10.8 g/dL — ABNORMAL LOW (ref 12.0–15.0)
Hemoglobin: 10.4 g/dL — ABNORMAL LOW (ref 12.0–15.0)
MCH: 28.6 pg (ref 26.0–34.0)
MCH: 28.2 pg (ref 26.0–34.0)
MCHC: 30.9 g/dL (ref 30.0–36.0)
MCHC: 31.1 g/dL (ref 30.0–36.0)
MCV: 92.6 fL (ref 78.0–100.0)
MCV: 90.5 fL (ref 78.0–100.0)
Platelets: 317 10*3/uL (ref 150–400)
Platelets: 294 10*3/uL (ref 150–400)
RBC: 3.78 MIL/uL — ABNORMAL LOW (ref 3.87–5.11)
RBC: 3.69 MIL/uL — ABNORMAL LOW (ref 3.87–5.11)
RDW: 16.8 % — ABNORMAL HIGH (ref 11.5–15.5)
RDW: 16.6 % — ABNORMAL HIGH (ref 11.5–15.5)
WBC: 15.3 10*3/uL — ABNORMAL HIGH (ref 4.0–10.5)
WBC: 15.5 10*3/uL — ABNORMAL HIGH (ref 4.0–10.5)

## 2015-04-12 LAB — APTT: aPTT: 35 seconds (ref 24–37)

## 2015-04-12 LAB — BASIC METABOLIC PANEL
Anion gap: 8 (ref 5–15)
BUN: 40 mg/dL — ABNORMAL HIGH (ref 6–20)
CO2: 26 mmol/L (ref 22–32)
Calcium: 8.3 mg/dL — ABNORMAL LOW (ref 8.9–10.3)
Chloride: 116 mmol/L — ABNORMAL HIGH (ref 101–111)
Creatinine, Ser: 3.75 mg/dL — ABNORMAL HIGH (ref 0.44–1.00)
GFR calc Af Amer: 14 mL/min — ABNORMAL LOW (ref >60)
GFR calc non Af Amer: 12 mL/min — ABNORMAL LOW (ref >60)
Glucose, Bld: 97 mg/dL (ref 65–99)
Potassium: 3.8 mmol/L (ref 3.5–5.1)
Sodium: 150 mmol/L — ABNORMAL HIGH (ref 135–145)

## 2015-04-12 LAB — URINALYSIS, ROUTINE W REFLEX MICROSCOPIC
Bilirubin Urine: NEGATIVE
Glucose, UA: NEGATIVE mg/dL
Ketones, ur: NEGATIVE mg/dL
Leukocytes, UA: NEGATIVE
Nitrite: NEGATIVE
Protein, ur: >300 mg/dL — AB
Specific Gravity, Urine: 1.01 (ref 1.005–1.030)
pH: 6.5 (ref 5.0–8.0)

## 2015-04-12 LAB — GLUCOSE, CAPILLARY
Glucose-Capillary: 65 mg/dL (ref 65–99)
Glucose-Capillary: 104 mg/dL — ABNORMAL HIGH (ref 65–99)
Glucose-Capillary: 62 mg/dL — ABNORMAL LOW (ref 65–99)
Glucose-Capillary: 87 mg/dL (ref 65–99)
Glucose-Capillary: 65 mg/dL (ref 65–99)
Glucose-Capillary: 70 mg/dL (ref 65–99)
Glucose-Capillary: 137 mg/dL — ABNORMAL HIGH (ref 65–99)
Glucose-Capillary: 65 mg/dL (ref 65–99)

## 2015-04-12 LAB — TSH: TSH: 1.914 u[IU]/mL (ref 0.350–4.500)

## 2015-04-12 LAB — URINE MICROSCOPIC-ADD ON

## 2015-04-12 LAB — PROTIME-INR
INR: 0.98 (ref 0.00–1.49)
Prothrombin Time: 13.2 seconds (ref 11.6–15.2)

## 2015-04-12 LAB — BRAIN NATRIURETIC PEPTIDE: B Natriuretic Peptide: 275.6 pg/mL — ABNORMAL HIGH (ref 0.0–100.0)

## 2015-04-12 LAB — CREATININE, URINE, RANDOM: Creatinine, Urine: 35.25 mg/dL

## 2015-04-12 LAB — ECHOCARDIOGRAM COMPLETE
Height: 64 in
Weight: 4160 oz

## 2015-04-12 LAB — COMPREHENSIVE METABOLIC PANEL
ALT: 34 U/L (ref 14–54)
AST: 30 U/L (ref 15–41)
Albumin: 2.8 g/dL — ABNORMAL LOW (ref 3.5–5.0)
Alkaline Phosphatase: 94 U/L (ref 38–126)
Anion gap: 8 (ref 5–15)
BUN: 39 mg/dL — ABNORMAL HIGH (ref 6–20)
CO2: 25 mmol/L (ref 22–32)
Calcium: 8.3 mg/dL — ABNORMAL LOW (ref 8.9–10.3)
Chloride: 114 mmol/L — ABNORMAL HIGH (ref 101–111)
Creatinine, Ser: 3.8 mg/dL — ABNORMAL HIGH (ref 0.44–1.00)
GFR calc Af Amer: 14 mL/min — ABNORMAL LOW (ref >60)
GFR calc non Af Amer: 12 mL/min — ABNORMAL LOW (ref >60)
Glucose, Bld: 55 mg/dL — ABNORMAL LOW (ref 65–99)
Potassium: 4 mmol/L (ref 3.5–5.1)
Sodium: 147 mmol/L — ABNORMAL HIGH (ref 135–145)
Total Bilirubin: 0.7 mg/dL (ref 0.3–1.2)
Total Protein: 6.3 g/dL — ABNORMAL LOW (ref 6.5–8.1)

## 2015-04-12 LAB — SEDIMENTATION RATE: Sed Rate: 58 mm/hr — ABNORMAL HIGH (ref 0–22)

## 2015-04-12 LAB — SODIUM, URINE, RANDOM: Sodium, Ur: 97 mmol/L

## 2015-04-12 LAB — TROPONIN I: Troponin I: 0.03 ng/mL (ref <0.031)

## 2015-04-12 LAB — CBG MONITORING, ED: Glucose-Capillary: 107 mg/dL — ABNORMAL HIGH (ref 65–99)

## 2015-04-12 LAB — LACTIC ACID, PLASMA: Lactic Acid, Venous: 0.6 mmol/L (ref 0.5–2.0)

## 2015-04-12 LAB — C-REACTIVE PROTEIN: CRP: 0.6 mg/dL (ref <1.0)

## 2015-04-12 MED ORDER — DEXTROSE 50 % IV SOLN
INTRAVENOUS | Status: AC
  Administered 2015-04-12: 50 mL
  Filled 2015-04-12: qty 50

## 2015-04-12 MED ORDER — CLONIDINE HCL 0.1 MG PO TABS
ORAL_TABLET | ORAL | Status: AC
  Filled 2015-04-12: qty 1

## 2015-04-12 MED ORDER — ACETAMINOPHEN 325 MG PO TABS
650.0000 mg | ORAL_TABLET | Freq: Four times a day (QID) | ORAL | Status: DC | PRN
  Administered 2015-04-13 – 2015-04-18 (×5): 650 mg via ORAL
  Filled 2015-04-12 (×5): qty 2

## 2015-04-12 MED ORDER — HYDRALAZINE HCL 20 MG/ML IJ SOLN
10.0000 mg | INTRAMUSCULAR | Status: DC | PRN
  Administered 2015-04-12: 10 mg via INTRAVENOUS
  Filled 2015-04-12 (×2): qty 1

## 2015-04-12 MED ORDER — IPRATROPIUM BROMIDE 0.02 % IN SOLN: 0.5000 mg | RESPIRATORY_TRACT | Status: DC | PRN

## 2015-04-12 MED ORDER — ONDANSETRON HCL 4 MG/2ML IJ SOLN
4.0000 mg | Freq: Four times a day (QID) | INTRAMUSCULAR | Status: DC | PRN
  Administered 2015-04-15 – 2015-04-16 (×3): 4 mg via INTRAVENOUS
  Filled 2015-04-12: qty 2

## 2015-04-12 MED ORDER — FUROSEMIDE 10 MG/ML IJ SOLN
160.0000 mg | Freq: Three times a day (TID) | INTRAVENOUS | Status: DC
  Administered 2015-04-12 – 2015-04-14 (×7): 160 mg via INTRAVENOUS
  Filled 2015-04-12: qty 10
  Filled 2015-04-12 (×11): qty 16

## 2015-04-12 MED ORDER — ONDANSETRON HCL 4 MG PO TABS
4.0000 mg | ORAL_TABLET | Freq: Four times a day (QID) | ORAL | Status: DC | PRN
  Administered 2015-04-17 – 2015-04-18 (×2): 4 mg via ORAL
  Filled 2015-04-12 (×2): qty 1

## 2015-04-12 MED ORDER — LORATADINE 10 MG PO TABS
10.0000 mg | ORAL_TABLET | Freq: Every day | ORAL | Status: DC
  Administered 2015-04-12 – 2015-04-19 (×6): 10 mg via ORAL
  Filled 2015-04-12 (×7): qty 1

## 2015-04-12 MED ORDER — DEXTROSE 50 % IV SOLN
1.0000 | Freq: Once | INTRAVENOUS | Status: AC
  Administered 2015-04-12: 50 mL via INTRAVENOUS
  Filled 2015-04-12: qty 50

## 2015-04-12 MED ORDER — METOLAZONE 10 MG PO TABS
10.0000 mg | ORAL_TABLET | Freq: Every day | ORAL | Status: DC
  Administered 2015-04-12 – 2015-04-17 (×5): 10 mg via ORAL
  Filled 2015-04-12 (×7): qty 1

## 2015-04-12 MED ORDER — ALBUTEROL SULFATE (2.5 MG/3ML) 0.083% IN NEBU: 2.5000 mg | INHALATION_SOLUTION | RESPIRATORY_TRACT | Status: DC | PRN

## 2015-04-12 MED ORDER — CLONIDINE HCL 0.1 MG PO TABS
0.1000 mg | ORAL_TABLET | Freq: Two times a day (BID) | ORAL | Status: DC
  Administered 2015-04-12 – 2015-04-17 (×11): 0.1 mg via ORAL
  Filled 2015-04-12 (×11): qty 1

## 2015-04-12 MED ORDER — PANTOPRAZOLE SODIUM 40 MG PO TBEC
40.0000 mg | DELAYED_RELEASE_TABLET | Freq: Every day | ORAL | Status: DC
  Administered 2015-04-12 – 2015-04-19 (×7): 40 mg via ORAL
  Filled 2015-04-12 (×7): qty 1

## 2015-04-12 MED ORDER — HEPARIN SODIUM (PORCINE) 5000 UNIT/ML IJ SOLN
5000.0000 [IU] | Freq: Three times a day (TID) | INTRAMUSCULAR | Status: DC
  Administered 2015-04-12 – 2015-04-16 (×12): 5000 [IU] via SUBCUTANEOUS
  Filled 2015-04-12 (×13): qty 1

## 2015-04-12 MED ORDER — FUROSEMIDE 10 MG/ML IJ SOLN
80.0000 mg | Freq: Once | INTRAMUSCULAR | Status: AC
  Administered 2015-04-12: 80 mg via INTRAVENOUS
  Filled 2015-04-12: qty 8

## 2015-04-12 MED ORDER — SODIUM CHLORIDE 0.9 % IV SOLN: 250.0000 mL | INTRAVENOUS | Status: DC | PRN

## 2015-04-12 MED ORDER — GLUCOSE-VITAMIN C 4-6 GM-MG PO CHEW
CHEWABLE_TABLET | ORAL | Status: AC
  Administered 2015-04-12: 18:00:00
  Filled 2015-04-12: qty 1

## 2015-04-12 MED ORDER — HYDRALAZINE HCL 50 MG PO TABS
50.0000 mg | ORAL_TABLET | Freq: Four times a day (QID) | ORAL | Status: DC
  Administered 2015-04-12 – 2015-04-17 (×19): 50 mg via ORAL
  Filled 2015-04-12 (×21): qty 1

## 2015-04-12 MED ORDER — SODIUM CHLORIDE 0.9% FLUSH
3.0000 mL | Freq: Two times a day (BID) | INTRAVENOUS | Status: DC
Start: 2015-04-12 — End: 2015-04-12
  Administered 2015-04-12: 3 mL via INTRAVENOUS

## 2015-04-12 MED ORDER — HYDRALAZINE HCL 20 MG/ML IJ SOLN
5.0000 mg | Freq: Once | INTRAMUSCULAR | Status: AC
  Administered 2015-04-12: 5 mg via INTRAVENOUS
  Filled 2015-04-12: qty 1

## 2015-04-12 MED ORDER — SODIUM CHLORIDE 0.9% FLUSH
3.0000 mL | INTRAVENOUS | Status: DC | PRN
  Administered 2015-04-18: 3 mL via INTRAVENOUS
  Filled 2015-04-12: qty 3

## 2015-04-12 MED ORDER — ACETAMINOPHEN 650 MG RE SUPP: 650.0000 mg | Freq: Four times a day (QID) | RECTAL | Status: DC | PRN

## 2015-04-12 MED ORDER — ATORVASTATIN CALCIUM 80 MG PO TABS
80.0000 mg | ORAL_TABLET | Freq: Every day | ORAL | Status: DC
  Administered 2015-04-12 – 2015-04-19 (×7): 80 mg via ORAL
  Filled 2015-04-12 (×7): qty 1

## 2015-04-12 MED ORDER — FUROSEMIDE 10 MG/ML IJ SOLN
60.0000 mg | Freq: Two times a day (BID) | INTRAMUSCULAR | Status: DC
  Administered 2015-04-12: 60 mg via INTRAVENOUS
  Filled 2015-04-12: qty 6

## 2015-04-12 MED ORDER — CARVEDILOL 25 MG PO TABS
50.0000 mg | ORAL_TABLET | Freq: Two times a day (BID) | ORAL | Status: DC
  Administered 2015-04-12 – 2015-04-17 (×10): 50 mg via ORAL
  Filled 2015-04-12 (×11): qty 2

## 2015-04-12 NOTE — Progress Notes (Signed)
Patient admitted after midnight, please see H&P.  PATIENT BELONGS to [place] but since admitted her, wants to stay.  CR is worse and now with > 300 protein, swelling-- ? Nephrotic syndrome.  Hold lasix as Na is 150.  Ask Renal to see  Rachel Bear DO

## 2015-04-12 NOTE — Consult Note (Addendum)
Renal Service Consult Note [facility]  Rachel Chaney 04/12/2015 [place] Requesting Physician:  Dr. Eliseo Squires  Reason for Consult:  CKD patient w fluid overload HPI: The patient is a 55 y.o. year-old with 14 yr hx of poorly controlled DM, HTN and hx CKD w creat 2.5 back in Dec 2016.  She presented to ED yesterday with SOB/ DOE, labored breathing and pulm edema on CXR.  Admitted, got 60 mg IV lasix and voided 900 cc yesterday recorded. BP down and pt feeling better today.    DM 15 yrs, laser Rx of "detached retina".  On lasix 120 mg bid at home.  Denies CP, fevers, abd pain, n/v/d.  No nsaids or ACEi.  Coreg/ clon/ hydralazine/ lasix for BP at home.     ROS  denies CP  no joint pain   no HA  no blurry vision  no rash  no diarrhea  no nausea/ vomiting  no dysuria  no difficulty voiding  no change in urine color    Past Medical History  Past Medical History  Diagnosis Date  . Diabetes mellitus   . Hyperlipidemia   . Hypertension    Past Surgical History  Past Surgical History  Procedure Laterality Date  . Eye surgery Right     retinal detachment   Family History  Family History  Problem Relation Age of Onset  . Heart disease Mother   . Heart disease Father    Social History  reports that she has never smoked. She has never used smokeless tobacco. She reports that she does not drink alcohol. Her drug history is not on file. Allergies No Known Allergies Home medications Prior to Admission medications   Medication Sig Start Date End Date Taking? Authorizing Provider  acetaminophen (TYLENOL) 500 MG tablet Take 1,000 mg by mouth every 6 (six) hours as needed for pain.   Yes Historical Provider, MD  atorvastatin (LIPITOR) 80 MG tablet Take 1 tablet (80 mg total) by mouth daily. 08/06/14  Yes Alyssa A Haney, MD  calcitRIOL (ROCALTROL) 0.25 MCG capsule TAKE 1 CAPSULE (0.25 MCG TOTAL) BY MOUTH DAILY. 02/19/15  Yes Alyssa A Lincoln Brigham, MD  carvedilol (COREG)  25 MG tablet Take 2 tablets (50 mg total) by mouth 2 (two) times daily with a meal. 02/18/15  Yes Alyssa A Haney, MD  cetirizine (ZYRTEC) 10 MG tablet Take 10 mg by mouth daily as needed for allergies.   Yes Historical Provider, MD  cloNIDine (CATAPRES) 0.1 MG tablet Take 1 tablet (0.1 mg total) by mouth 2 (two) times daily. 02/18/15  Yes Alyssa A Lincoln Brigham, MD  furosemide (LASIX) 80 MG tablet Take 2 tablets (160 mg total) by mouth 2 (two) times daily. 02/18/15  Yes Alyssa A Haney, MD  glucose blood (ONE TOUCH ULTRA TEST) test strip Use as instructed 10/27/14  Yes Alyssa A Haney, MD  hydrALAZINE (APRESOLINE) 50 MG tablet Take 1 tablet (50 mg total) by mouth every 6 (six) hours. 12/18/14  Yes Hillary Corinda Gubler, MD  Insulin Pen Needle 29G X 12MM MISC Inject 1 pen into the skin 2 (two) times daily. Check blood sugar daily. 04/29/13  Yes Waldemar Dickens, MD  Insulin Pen Needle 31G X 5 MM MISC Use as directed for insulin injection bid 01/07/14  Yes Dickie La, MD  LANTUS SOLOSTAR 100 UNIT/ML Solostar Pen INJECT 50 UNITS IN THE MORNING AND 30 UNITS IN THE EVENING 07/21/14  Yes Veatrice Bourbon, MD  [facility] DELICA LANCETS [id number]  MISC 1 Device by Does not apply route as needed (to check blood glucose). 05/02/13  Yes Waldemar Dickens, MD  Syringe, Disposable, 1 ML MISC Use as directed 07/01/14  Yes Veatrice Bourbon, MD  diclofenac sodium (VOLTAREN) 1 % GEL Apply 2 g topically 4 (four) times daily. Patient not taking: Reported on 02/18/2015 05/21/14   Frazier Richards, MD  omeprazole (PRILOSEC) 20 MG capsule Take 1 capsule (20 mg total) by mouth daily as needed (acid reflux). Patient not taking: Reported on 02/18/2015 12/09/13   [name], MD   Liver Function Tests  Recent Labs Lab 04/11/15 2331  AST 30  ALT 34  ALKPHOS 94  BILITOT 0.7  PROT 6.3*  ALBUMIN 2.8*   No results for input(s): LIPASE, AMYLASE in the last 168 hours. CBC  Recent Labs Lab 04/11/15 2246 04/12/15 0502  WBC 15.3* 15.5*  HGB 10.8* 10.4*   HCT 35.0* 33.4*  MCV 92.6 90.5  PLT 317 [id number]   Basic Metabolic Panel  Recent Labs Lab 04/11/15 2331 04/12/15 0502  NA 147* 150*  K 4.0 3.8  CL 114* 116*  CO2 25 26  GLUCOSE 55* 97  BUN 39* 40*  CREATININE 3.80* 3.75*  CALCIUM 8.3* 8.3*    Filed Vitals:   04/12/15 0310 04/12/15 0632 04/12/15 0900 04/12/15 1403  BP: 186/79 160/83  129/66  Pulse:  68  74  Temp:  97.6 F (36.4 C)  97.7 F (36.5 C)  TempSrc:  Oral  Oral  Resp:  18  18  Height:   5\' 4"  (1.626 m)   Weight:   117.935 kg (260 lb)   SpO2: 93% 100%  100%   Exam Obese pleasant AAF no distress No rash, cyanosis or gangrene Sclera anicteric, throat clear  No jvd or bruits Chest rales 2/3 up on R, L side clear RRR no MRG Abd soft ntnd no mass or ascites +bs obese GU deferred MS no joint effusions or deformity Ext 2+ bilat LE edema LE's/ no wounds or ulcers Neuro is alert, Ox 3 , nf, no asterixis   Assessment: 1. 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  Progressive disease.  Cancelled 24 hr urine protein, we know she is nephrotic from last admission.  Didn't f/u with renal after last admit 12/16, says she didn't get an appt.  Consider adding ACEi but in outpatient while not undergoing aggressive diruesis.  2. Fluid overload / pulm edema / anasarca - needs high dose lasix and will add zaroxlyn.  3. HD access - will get [place] for vein mapping and consider asking [facility] for AVF prior to dc 4. DM longstanding 5. HTN on 3 BP medications at home 6. Hypernatremia - not sure cause, BS's not high.  Obserive, urine osm.    Plan - as above  Kelly Splinter MD [facility] pager [phone]    cell [phone] 04/12/2015, 4:44 PM

## 2015-04-12 NOTE — Progress Notes (Signed)
Hypoglycemic Event  CBG: 70  Treatment: 15 GM carbohydrate snack  Symptoms: None  Follow-up CBG: Time:1740/1820 CBG Result:65/126  Possible Reasons for Event: Unknown  Comments/MD notified:    Rachel Chaney A

## 2015-04-12 NOTE — H&P (Signed)
[facility] History and Physical  Rachel Chaney [id number] DOB: [birth year] DOA: 04/11/2015  Referring physician:ED PCP: Marina Goodell, MD   Chief Complaint: Back pain and shortness of breath  HPI:  Rachel Chaney is a 55 year old female with a past medical history significant for diabetes mellitus type 2, chronic kidney disease stage III, and hypertension; who presents with complaints of low back pain  worsening shortness of breath. Symptoms of shortness of breath progressively worsened over the course of the weekend. Any significant exertion worsened symptoms.. Patient does not normally use oxygen at home. Husband states that he's noticed that her legs and abdomen had been swelling, and question if her standing on her feet for long periods of time during the day with her job was the cause. States that she previously was admitted to the hospital 4 months ago for similar symptoms, but they are unsure of what was the cause. They reported that she is taking all her medications as advised, but still having swelling. The source of back pain this appears to be more by chronic issue. The only other change recently and that she notes is that she was started on Lantus, but notes that she has not eaten much today. Upon review of records this appears her last weight at her primary care office was 252 pounds and she was found to be 270+ pounds here tonight  Upon admission patient is evaluated and seen have blood pressure is high as 227/98, temperature 95.77F, respiratory rate 23, pulse rate 74, and O2 sats 90% on room air. Initial lab work revealed WBC 15.3( which appears to be more so chronic), hemoglobin 10.8, sodium 147, chloride 114, bicarbonate 25, BUN 39, creatinine 3.8, glucose 55.      Review of Systems  Constitutional: Negative for fever and chills.       Decreased appetite  Respiratory: Positive for shortness of breath. Negative for wheezing.   Cardiovascular: Positive for orthopnea and  leg swelling. Negative for chest pain.  Gastrointestinal: Positive for abdominal pain. Negative for nausea and vomiting.  Genitourinary: Positive for frequency. Negative for urgency.  Skin: Negative for itching and rash.  Neurological: Negative for speech change and focal weakness.  Psychiatric/Behavioral: Negative for suicidal ideas and substance abuse.     Past Medical History  Diagnosis Date  . Diabetes mellitus   . Hyperlipidemia   . Hypertension      Past Surgical History  Procedure Laterality Date  . Eye surgery Right     retinal detachment      Social History:  reports that she has never smoked. She has never used smokeless tobacco. She reports that she does not drink alcohol. Her drug history is not on file. Where does patient live--home  and with whom if at home? husband   Can patient participate in ADLs?Yes  No Known Allergies  Family History  Problem Relation Age of Onset  . Heart disease Mother   . Heart disease Father        Prior to Admission medications   Medication Sig Start Date End Date Taking? Authorizing Provider  acetaminophen (TYLENOL) 500 MG tablet Take 1,000 mg by mouth every 6 (six) hours as needed for pain.   Yes Historical Provider, MD  atorvastatin (LIPITOR) 80 MG tablet Take 1 tablet (80 mg total) by mouth daily. 08/06/14  Yes Alyssa A Haney, MD  calcitRIOL (ROCALTROL) 0.25 MCG capsule TAKE 1 CAPSULE (0.25 MCG TOTAL) BY MOUTH DAILY. 02/19/15  Yes Veatrice Bourbon, MD  carvedilol (COREG)  25 MG tablet Take 2 tablets (50 mg total) by mouth 2 (two) times daily with a meal. 02/18/15  Yes Alyssa A Haney, MD  cetirizine (ZYRTEC) 10 MG tablet Take 10 mg by mouth daily as needed for allergies.   Yes Historical Provider, MD  cloNIDine (CATAPRES) 0.1 MG tablet Take 1 tablet (0.1 mg total) by mouth 2 (two) times daily. 02/18/15  Yes Alyssa A Lincoln Brigham, MD  furosemide (LASIX) 80 MG tablet Take 2 tablets (160 mg total) by mouth 2 (two) times daily. 02/18/15  Yes Alyssa  A Haney, MD  glucose blood (ONE TOUCH ULTRA TEST) test strip Use as instructed 10/27/14  Yes Alyssa A Haney, MD  hydrALAZINE (APRESOLINE) 50 MG tablet Take 1 tablet (50 mg total) by mouth every 6 (six) hours. 12/18/14  Yes Hillary Corinda Gubler, MD  Insulin Pen Needle 29G X 12MM MISC Inject 1 pen into the skin 2 (two) times daily. Check blood sugar daily. 04/29/13  Yes Waldemar Dickens, MD  Insulin Pen Needle 31G X 5 MM MISC Use as directed for insulin injection bid 01/07/14  Yes Dickie La, MD  LANTUS SOLOSTAR 100 UNIT/ML Solostar Pen INJECT 50 UNITS IN THE MORNING AND 30 UNITS IN THE EVENING 07/21/14  Yes Alyssa Bishop Dublin, MD  ONETOUCH DELICA LANCETS [id number] MISC 1 Device by Does not apply route as needed (to check blood glucose). 05/02/13  Yes Waldemar Dickens, MD  Syringe, Disposable, 1 ML MISC Use as directed 07/01/14  Yes Veatrice Bourbon, MD  diclofenac sodium (VOLTAREN) 1 % GEL Apply 2 g topically 4 (four) times daily. Patient not taking: Reported on 02/18/2015 05/21/14   Frazier Richards, MD  omeprazole (PRILOSEC) 20 MG capsule Take 1 capsule (20 mg total) by mouth daily as needed (acid reflux). Patient not taking: Reported on 02/18/2015 12/09/13   [name], MD     Physical Exam: Filed Vitals:   04/12/15 0040 04/12/15 0053 04/12/15 0124 04/12/15 0234  BP: 212/105  224/92 181/87  Pulse: 73 70 72 74  Temp: 95.1 F (35.1 C)     TempSrc: Rectal     Resp: 18 22 16 23   SpO2: 97% 96% 100% 91%     Constitutional: Vital signs reviewed. Patient is a obese female in mild respiratory distress. Head: Normocephalic and atraumatic  Ear: TM normal bilaterally  Mouth: no erythema or exudates, MMM  Eyes: PERRL, EOMI, conjunctivae normal, No scleral icterus.  Neck: Supple, Trachea midline normal ROM, No JVD, mass, thyromegaly, or carotid bruit present.  Cardiovascular: RRR, S1 normal, S2 normal, no MRG, pulses symmetric and intact bilaterally  Pulmonary/Chest: Tachypneic with decreased aeration. No  wheezing appreciated Abdominal: Soft. Non-tender, Moderately distended, bowel sounds are normal, no masses, organomegaly, or guarding present.  GU: no CVA tenderness Musculoskeletal: No joint deformities, erythema, or stiffness, ROM full and no nontender Ext: +2 pitting edema edema of the bilateral lower extreme and no cyanosis, pulses palpable bilaterally (DP and PT)  Hematology: no cervical, inginal, or axillary adenopathy.  Neurological: A&O x3, Strenght is normal and symmetric bilaterally, cranial nerve II-XII are grossly intact, no focal motor deficit, sensory intact to light touch bilaterally.  Skin: Warm, dry and intact. No rash, cyanosis, or clubbing.  Psychiatric: Normal mood and affect. speech and behavior is normal. Judgment and thought content normal. Cognition and memory are normal.      Data Review   Micro Results No results found for this or any previous visit (from the past  240 hour(s)).  Radiology Reports Dg Chest 2 View  04/11/2015  CLINICAL DATA:  Shortness of breath beginning at 10 p.m. tonight. History of hypertension, hyperlipidemia and diabetes. EXAM: CHEST  2 VIEW COMPARISON:  Chest radiograph December 13, 2014 FINDINGS: The cardiac silhouette appears moderately enlarged and unchanged. Mediastinal silhouette is nonsuspicious. Pulmonary vascular congestion, interstitial prominence and patchy airspace opacities. No pleural effusion. No pneumothorax. Large body habitus. Osseous structures are nonsuspicious. IMPRESSION: Similar cardiomegaly, interstitial and alveolar airspace opacities compatible with pulmonary edema. Electronically Signed   By: Elon Alas M.D.   On: 04/11/2015 23:24     CBC  Recent Labs Lab 04/11/15 2246  WBC 15.3*  HGB 10.8*  HCT 35.0*  PLT 317  MCV 92.6  MCH 28.6  MCHC 30.9  RDW 16.8*    Chemistries   Recent Labs Lab 04/11/15 2331  NA 147*  K 4.0  CL 114*  CO2 25  GLUCOSE 55*  BUN 39*  CREATININE 3.80*  CALCIUM 8.3*  AST  30  ALT 34  ALKPHOS 94  BILITOT 0.7   ------------------------------------------------------------------------------------------------------------------ CrCl cannot be calculated (Unknown ideal weight.). ------------------------------------------------------------------------------------------------------------------ No results for input(s): HGBA1C in the last 72 hours. ------------------------------------------------------------------------------------------------------------------ No results for input(s): CHOL, HDL, LDLCALC, TRIG, CHOLHDL, LDLDIRECT in the last 72 hours. ------------------------------------------------------------------------------------------------------------------ No results for input(s): TSH, T4TOTAL, T3FREE, THYROIDAB in the last 72 hours.  Invalid input(s): FREET3 ------------------------------------------------------------------------------------------------------------------ No results for input(s): VITAMINB12, FOLATE, FERRITIN, TIBC, IRON, RETICCTPCT in the last 72 hours.  Coagulation profile  Recent Labs Lab 04/11/15 2331  INR 0.98    No results for input(s): DDIMER in the last 72 hours.  Cardiac Enzymes  Recent Labs Lab 04/11/15 2331  TROPONINI 0.03   ------------------------------------------------------------------------------------------------------------------ Invalid input(s): POCBNP   CBG:  Recent Labs Lab 04/11/15 2241 04/12/15 0133  GLUCAP 41* 107*       EKG: Independently reviewed.    Assessment/Plan Acute diastolic (congestive) heart failure ([place]): Acute. Patient with complaints of worsening shortness of breath with worsening leg and abdominal swelling. 2+ pitting edema on physical exam, BNP 247, chest x-ray showing cardiomegaly with vascular congestion. Given 80 mg of Lasix IV in the emergency department. Last echo showed an ejection fraction of previously showing EF of 60-65% with grade 1 diastolic dysfunction. Suspect  acute exacerbation of diastolic heart failure or a combination with end-stage renal disease 2/2 hypertension.  - Admit to a telemetry bed - Strict I&O and daily weights - Lasix 60 mg IV twice a day -  Repeat echocardiogram -Consult cardiology in a.m.   Hypoglycemia with diabetes mellitus type 2.: Acute. Patient's initial blood glucose was 55 for which she was given an amp of D50. Secondary to patient having decreased  appetite today while still taking her scheduled Lantus. The low blood sugars also possibly lead to her hypothermia. - Hypoglycemic protocols - Held home insulin regimen, restart when patient eating and drinking like normal  - CBGs every 4 hours  Acute renal failure on chronic kidney disease stage IV: Question of patient's acute kidney failure is a combination of patient's uncontrolled hypertension and history of diabetes. Patient went from a creatinine of 2.74 months ago to 3.8 today. - Check FeUr - Renal ultrasound - recommend consult to nephrology in a.m.   Uncontrolled hypertension:This appears to be a chronic issue for which patient has had office visits with blood pressure uncontrolled question this is worsening heart failure and kidney function. - Continue home medications of clonidine, hydralazine, carvedilol,  Chronic leukocytosis: WBC 15.3.  UA negative  - may wanted to check with hematology concerning this - Continue to monitor  Hyperlipidemia - Continue atorvastatin  Jerrye Bushy - Continue Protonix   Code Status:   full Family Communication: bedside Disposition Plan: admit   Total time spent 55 minutes.Greater than 50% of this time was spent in counseling, explanation of diagnosis, planning of further management, and coordination of care  [facility] Pager [phone]  If 7PM-7AM, please contact night-coverage [email or web address] Password [email or web address] 04/12/2015, 3:02 AM

## 2015-04-12 NOTE — Progress Notes (Signed)
Inpatient Diabetes Program Recommendations  AACE/ADA: New Consensus Statement on Inpatient Glycemic Control (2015)  Target Ranges:  Prepandial:   less than 140 mg/dL      Peak postprandial:   less than 180 mg/dL (1-2 hours)      Critically ill patients:  140 - 180 mg/dL   Review of Glycemic Control Results for Rachel Chaney, Rachel Chaney (MRN [id number]) as of 04/12/2015 11:18  Ref. Range 04/11/2015 23:31 04/12/2015 05:02  Glucose Latest Ref Range: 65-99 mg/dL 55 (L) 97   Results for Rachel Chaney, Rachel Chaney (MRN [id number]) as of 04/12/2015 11:18  Ref. Range 04/12/2015 01:33 04/12/2015 03:21 04/12/2015 03:55 04/12/2015 07:37 04/12/2015 09:13  Glucose-Capillary Latest Ref Range: 65-99 mg/dL 107 (H) 65 104 (H) 62 (L) 87   Hypoglycemia on admission. Will need to decrease home insulin dose.  Inpatient Diabetes Program Recommendations:    Add Lantus 15 units bid. Novolog sensitive tidwc Check HgbA1C to assess glycemic control prior to admission  Will continue to follow. Thank you. Lorenda Peck, RD, LDN, CDE Inpatient Diabetes Coordinator [phone]

## 2015-04-12 NOTE — Evaluation (Signed)
Physical Therapy Evaluation Patient Details Name: Rachel Chaney MRN: [id number] DOB: 1960/02/16 Today's Date: 04/12/2015   History of Present Illness  55 year old female with a past medical history significant for diabetes mellitus type 2, chronic kidney disease stage III, and hypertension and admitted for acute diastolic heart failure and hypoglycemia  Clinical Impression  Pt admitted with above diagnosis. Pt currently with functional limitations due to the deficits listed below (see PT Problem List).  Pt will benefit from skilled PT to increase their independence and safety with mobility to allow discharge to the venue listed below.   Pt ambulated short distance in hallway and reports more resting breaks then usual required.  Pt's SpO2 mostly in 90s on room air however briefly reading of 71% so reapplied 3L O2 [place].  Pt does have acrylic nails which may be affecting accuracy.  No anticipated f/u needs upon d/c.     Follow Up Recommendations No PT follow up    Equipment Recommendations  None recommended by PT    Recommendations for Other Services       Precautions / Restrictions Precautions Precautions: Fall Precaution Comments: monitor sats      Mobility  Bed Mobility Overal bed mobility: Modified Independent             General bed mobility comments: with HOB elevated  Transfers Overall transfer level: Needs assistance Equipment used: None Transfers: Sit to/from Stand Sit to Stand: Min guard         General transfer comment: pt with LOB upon rise however able to self steady, min/guard for safety  Ambulation/Gait Ambulation/Gait assistance: Min guard Ambulation Distance (Feet): 80 Feet Assistive device: None Gait Pattern/deviations: Step-through pattern;Decreased stride length     General Gait Details: occasional standing rest break due to fatigue, slightly unsteady at times however no overt LOB, pt reports unsteadiness from being in bed, SPO2 mostly 93% on  room air however reading briefly at 71% so applied 3L O2 [place] and back up to 96% in less then one mintue (?accuracy due to pt's acrylic nails)  Stairs            Wheelchair Mobility    Modified Rankin (Stroke Patients Only)       Balance                                             Pertinent Vitals/Pain Pain Assessment: No/denies pain    Home Living Family/patient expects to be discharged to:: Private residence Living Arrangements: Spouse/significant other;Other relatives           Home Layout: One level Home Equipment: None      Prior Function Level of Independence: Independent               Hand Dominance        Extremity/Trunk Assessment               Lower Extremity Assessment: Generalized weakness         Communication   Communication: No difficulties  Cognition Arousal/Alertness: Awake/alert Behavior During Therapy: WFL for tasks assessed/performed Overall Cognitive Status: Within Functional Limits for tasks assessed                      General Comments      Exercises        Assessment/Plan    PT Assessment  Patient needs continued PT services  PT Diagnosis Difficulty walking   PT Problem List Decreased mobility;Cardiopulmonary status limiting activity;Decreased activity tolerance;Obesity  PT Treatment Interventions DME instruction;Gait training;Functional mobility training;Patient/family education;Therapeutic exercise;Therapeutic activities   PT Goals (Current goals can be found in the Care Plan section) Acute Rehab PT Goals PT Goal Formulation: With patient Time For Goal Achievement: 04/19/15 Potential to Achieve Goals: Good    Frequency Min 3X/week   Barriers to discharge        Co-evaluation               End of Session Equipment Utilized During Treatment: Oxygen Activity Tolerance: Patient tolerated treatment well Patient left: in bed;with call bell/phone within reach;with  family/visitor present Nurse Communication: Mobility status         Time: [id number] PT Time Calculation (min) (ACUTE ONLY): 12 min   Charges:   PT Evaluation $PT Eval Low Complexity: 1 Procedure     PT G Codes:        Sherese Heyward,KATHrine E 04/12/2015, 11:43 AM Carmelia Bake, PT, DPT 04/12/2015 Pager: [phone]

## 2015-04-12 NOTE — Progress Notes (Signed)
Echocardiogram 2D Echocardiogram has been performed.  Rachel Chaney 04/12/2015, 2:31 PM

## 2015-04-13 ENCOUNTER — Inpatient Hospital Stay (HOSPITAL_COMMUNITY): Payer: Commercial Managed Care - HMO

## 2015-04-13 DIAGNOSIS — R0602 Shortness of breath: Secondary | ICD-10-CM | POA: Diagnosis present

## 2015-04-13 DIAGNOSIS — K219 Gastro-esophageal reflux disease without esophagitis: Secondary | ICD-10-CM | POA: Diagnosis present

## 2015-04-13 DIAGNOSIS — E785 Hyperlipidemia, unspecified: Secondary | ICD-10-CM | POA: Diagnosis present

## 2015-04-13 DIAGNOSIS — R188 Other ascites: Secondary | ICD-10-CM | POA: Diagnosis present

## 2015-04-13 DIAGNOSIS — Z8249 Family history of ischemic heart disease and other diseases of the circulatory system: Secondary | ICD-10-CM | POA: Diagnosis not present

## 2015-04-13 DIAGNOSIS — N184 Chronic kidney disease, stage 4 (severe): Secondary | ICD-10-CM

## 2015-04-13 DIAGNOSIS — E1122 Type 2 diabetes mellitus with diabetic chronic kidney disease: Secondary | ICD-10-CM | POA: Diagnosis present

## 2015-04-13 DIAGNOSIS — I10 Essential (primary) hypertension: Secondary | ICD-10-CM | POA: Diagnosis not present

## 2015-04-13 DIAGNOSIS — E162 Hypoglycemia, unspecified: Secondary | ICD-10-CM | POA: Diagnosis not present

## 2015-04-13 DIAGNOSIS — Z6841 Body Mass Index (BMI) 40.0 and over, adult: Secondary | ICD-10-CM | POA: Diagnosis not present

## 2015-04-13 DIAGNOSIS — R680 Hypothermia, not associated with low environmental temperature: Secondary | ICD-10-CM | POA: Diagnosis present

## 2015-04-13 DIAGNOSIS — N186 End stage renal disease: Secondary | ICD-10-CM | POA: Diagnosis present

## 2015-04-13 DIAGNOSIS — N2581 Secondary hyperparathyroidism of renal origin: Secondary | ICD-10-CM | POA: Diagnosis present

## 2015-04-13 DIAGNOSIS — I509 Heart failure, unspecified: Secondary | ICD-10-CM | POA: Diagnosis not present

## 2015-04-13 DIAGNOSIS — E1165 Type 2 diabetes mellitus with hyperglycemia: Secondary | ICD-10-CM | POA: Diagnosis present

## 2015-04-13 DIAGNOSIS — E870 Hyperosmolality and hypernatremia: Secondary | ICD-10-CM | POA: Diagnosis present

## 2015-04-13 DIAGNOSIS — E669 Obesity, unspecified: Secondary | ICD-10-CM | POA: Diagnosis present

## 2015-04-13 DIAGNOSIS — E11649 Type 2 diabetes mellitus with hypoglycemia without coma: Secondary | ICD-10-CM | POA: Diagnosis present

## 2015-04-13 DIAGNOSIS — N185 Chronic kidney disease, stage 5: Secondary | ICD-10-CM | POA: Diagnosis not present

## 2015-04-13 DIAGNOSIS — N289 Disorder of kidney and ureter, unspecified: Secondary | ICD-10-CM | POA: Diagnosis present

## 2015-04-13 DIAGNOSIS — I132 Hypertensive heart and chronic kidney disease with heart failure and with stage 5 chronic kidney disease, or end stage renal disease: Secondary | ICD-10-CM | POA: Diagnosis present

## 2015-04-13 DIAGNOSIS — N049 Nephrotic syndrome with unspecified morphologic changes: Secondary | ICD-10-CM | POA: Diagnosis not present

## 2015-04-13 DIAGNOSIS — N179 Acute kidney failure, unspecified: Secondary | ICD-10-CM | POA: Diagnosis present

## 2015-04-13 DIAGNOSIS — Z794 Long term (current) use of insulin: Secondary | ICD-10-CM | POA: Diagnosis not present

## 2015-04-13 DIAGNOSIS — E1121 Type 2 diabetes mellitus with diabetic nephropathy: Secondary | ICD-10-CM | POA: Diagnosis present

## 2015-04-13 DIAGNOSIS — I4581 Long QT syndrome: Secondary | ICD-10-CM | POA: Diagnosis present

## 2015-04-13 DIAGNOSIS — D72829 Elevated white blood cell count, unspecified: Secondary | ICD-10-CM | POA: Diagnosis not present

## 2015-04-13 DIAGNOSIS — I5031 Acute diastolic (congestive) heart failure: Secondary | ICD-10-CM | POA: Diagnosis present

## 2015-04-13 LAB — BASIC METABOLIC PANEL
Anion gap: 9 (ref 5–15)
BUN: 41 mg/dL — ABNORMAL HIGH (ref 6–20)
CO2: 27 mmol/L (ref 22–32)
Calcium: 8 mg/dL — ABNORMAL LOW (ref 8.9–10.3)
Chloride: 112 mmol/L — ABNORMAL HIGH (ref 101–111)
Creatinine, Ser: 4.04 mg/dL — ABNORMAL HIGH (ref 0.44–1.00)
GFR calc Af Amer: 13 mL/min — ABNORMAL LOW (ref >60)
GFR calc non Af Amer: 11 mL/min — ABNORMAL LOW (ref >60)
Glucose, Bld: 119 mg/dL — ABNORMAL HIGH (ref 65–99)
Potassium: 4 mmol/L (ref 3.5–5.1)
Sodium: 148 mmol/L — ABNORMAL HIGH (ref 135–145)

## 2015-04-13 LAB — CBC
HCT: 32.4 % — ABNORMAL LOW (ref 36.0–46.0)
Hemoglobin: 9.6 g/dL — ABNORMAL LOW (ref 12.0–15.0)
MCH: 28.2 pg (ref 26.0–34.0)
MCHC: 29.6 g/dL — ABNORMAL LOW (ref 30.0–36.0)
MCV: 95 fL (ref 78.0–100.0)
Platelets: 295 10*3/uL (ref 150–400)
RBC: 3.41 MIL/uL — ABNORMAL LOW (ref 3.87–5.11)
RDW: 17.2 % — ABNORMAL HIGH (ref 11.5–15.5)
WBC: 14.7 10*3/uL — ABNORMAL HIGH (ref 4.0–10.5)

## 2015-04-13 LAB — GLUCOSE, CAPILLARY
Glucose-Capillary: 116 mg/dL — ABNORMAL HIGH (ref 65–99)
Glucose-Capillary: 90 mg/dL (ref 65–99)
Glucose-Capillary: 124 mg/dL — ABNORMAL HIGH (ref 65–99)
Glucose-Capillary: 141 mg/dL — ABNORMAL HIGH (ref 65–99)
Glucose-Capillary: 190 mg/dL — ABNORMAL HIGH (ref 65–99)
Glucose-Capillary: 173 mg/dL — ABNORMAL HIGH (ref 65–99)

## 2015-04-13 LAB — UREA NITROGEN, URINE: Urea Nitrogen, Ur: 175 mg/dL

## 2015-04-13 LAB — LIPID PANEL
Cholesterol: 105 mg/dL (ref 0–200)
HDL: 30 mg/dL — ABNORMAL LOW (ref >40)
LDL Cholesterol: 53 mg/dL (ref 0–99)
Total CHOL/HDL Ratio: 3.5 RATIO
Triglycerides: 111 mg/dL (ref <150)
VLDL: 22 mg/dL (ref 0–40)

## 2015-04-13 LAB — OSMOLALITY, URINE: Osmolality, Ur: 320 mOsm/kg (ref 300–900)

## 2015-04-13 MED ORDER — INSULIN ASPART 100 UNIT/ML ~~LOC~~ SOLN
0.0000 [IU] | Freq: Three times a day (TID) | SUBCUTANEOUS | Status: DC
  Administered 2015-04-13: 2 [IU] via SUBCUTANEOUS
  Administered 2015-04-14: 3 [IU] via SUBCUTANEOUS
  Administered 2015-04-14: 2 [IU] via SUBCUTANEOUS
  Administered 2015-04-14: 3 [IU] via SUBCUTANEOUS

## 2015-04-13 NOTE — Progress Notes (Signed)
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 -? Need for ACEi  in outpatient  -will get [place] for vein mapping and consider asking [facility] for AVF prior to dc  Fluid overload / pulm edema / anasarca  -renal consult -high dose lasix and zaroxlyn.   DM -SSI - was hypoglycemic this AM  HTN  -PO meds  Hypernatremia - -improving  Code Status: full Family Communication: patient Disposition Plan: may need transfer to [facility]? Defer to renal-- BELONGS TO FAMILY MED   Consultants:  renal  Procedures:      HPI/Subjective: Swelling improved  Objective: Filed Vitals:   04/12/15 1403 04/12/15 2000  BP: 129/66 132/60  Pulse: 74 78  Temp: 97.7 F (36.5 C) 98 F (36.7 C)  Resp: 18 20    Intake/Output Summary (Last 24 hours) at 04/13/15 1418 Last data filed at 04/13/15 0900  Gross per 24 hour  Intake    372 ml  Output   1200 ml  Net   -828 ml   Filed Weights   04/12/15 0234 04/12/15 0900 04/13/15 0700  Weight: 124.694 kg (274 lb 14.4 oz) 117.935 kg (260 lb) 125.42 kg (276 lb 8 oz)    Exam:   General:  Awake- going to bathroom  Cardiovascular: rrr  Respiratory: clear  Abdomen: +Bs, soft  Musculoskeletal: +edema   Data Reviewed: Basic Metabolic Panel:  Recent Labs Lab 04/11/15 2331 04/12/15 0502 04/13/15 0515  NA 147* 150* 148*  K 4.0 3.8 4.0  CL 114* 116* 112*  CO2 25 26 27   GLUCOSE 55* 97 119*  BUN 39* 40* 41*  CREATININE 3.80* 3.75* 4.04*  CALCIUM 8.3* 8.3* 8.0*   Liver Function Tests:  Recent Labs Lab 04/11/15 2331  AST 30  ALT 34  ALKPHOS 94  BILITOT 0.7  PROT 6.3*  ALBUMIN 2.8*   No results for input(s): LIPASE, AMYLASE in the last 168 hours. No results for input(s): AMMONIA in the last 168 hours. CBC:  Recent Labs Lab 04/11/15 2246  04/12/15 0502 04/13/15 0515  WBC 15.3* 15.5* 14.7*  HGB 10.8* 10.4* 9.6*  HCT 35.0* 33.4* 32.4*  MCV 92.6 90.5 95.0  PLT 317 294 295   Cardiac Enzymes:  Recent Labs Lab 04/11/15 2331  TROPONINI 0.03   BNP (last 3 results)  Recent Labs  12/13/14 1700 04/11/15 2245  BNP 171.7* 275.6*    ProBNP (last 3 results) No results for input(s): PROBNP in the last 8760 hours.  CBG:  Recent Labs Lab 04/12/15 1813 04/12/15 2203 04/13/15 0344 04/13/15 0738 04/13/15 1146  GLUCAP 137* 116* 90 124* 141*    No results found for this or any previous visit (from the past 240 hour(s)).   Studies: Dg Chest 2 View  04/11/2015  CLINICAL DATA:  Shortness of breath beginning at 10 p.m. tonight. History of hypertension, hyperlipidemia and diabetes. EXAM: CHEST  2 VIEW COMPARISON:  Chest radiograph December 13, 2014 FINDINGS: The cardiac silhouette appears moderately enlarged and unchanged. Mediastinal silhouette is nonsuspicious. Pulmonary vascular congestion, interstitial prominence and patchy airspace opacities. No pleural effusion. No pneumothorax. Large body habitus. Osseous structures are nonsuspicious. IMPRESSION: Similar cardiomegaly, interstitial and alveolar airspace opacities compatible with pulmonary edema. Electronically Signed   By: Elon Alas M.D.   On: 04/11/2015 23:24   US Renal  04/12/2015  CLINICAL DATA:  55 year old female with acute renal failure. EXAM: RENAL / URINARY TRACT ULTRASOUND COMPLETE COMPARISON:  12/14/2014 renal ultrasound FINDINGS: Right Kidney: Length: 12.9 cm. Echogenicity is upper limits of normal. There is no evidence of solid mass or hydronephrosis. Left Kidney: Length: 13.3 cm. Echogenicity is upper limits of normal. There is no evidence of solid mass or hydronephrosis. A 2.8 cm lower pole cyst is present. Bladder: Appears normal for degree of bladder distention. IMPRESSION: Upper limits of normal echogenicity of both kidneys which may represent medical  renal disease. No evidence of hydronephrosis or solid mass. Electronically Signed   By: Margarette Canada M.D.   On: 04/12/2015 11:49    Scheduled Meds: . atorvastatin  80 mg Oral q1800  . carvedilol  50 mg Oral BID WC  . cloNIDine  0.1 mg Oral BID  . furosemide  160 mg Intravenous Q8H  . heparin  5,000 Units Subcutaneous 3 times per day  . hydrALAZINE  50 mg Oral 4 times per day  . loratadine  10 mg Oral Daily  . metolazone  10 mg Oral Daily  . pantoprazole  40 mg Oral Daily   Continuous Infusions:  Antibiotics Given (last 72 hours)    None      Principal Problem:   Acute diastolic (congestive) heart failure (HCC) Active Problems:   Leukocytosis   Uncontrolled hypertension   Hypoglycemia   Acute renal failure superimposed on stage 4 chronic kidney disease ([place])    Time spent: 25 min    [facility] Pager [phone] If 7PM-7AM, please contact night-coverage at [email or web address], password [facility] 04/13/2015, 2:18 PM  LOS: 1 day

## 2015-04-13 NOTE — Care Management Note (Signed)
Case Management Note  Patient Details  Name: Rachel Chaney MRN: [id number] Date of Birth: 1960-04-15  Subjective/Objective: 56 y/o f admitted w/CHF. From home.                   Action/Plan:d/c plan home.   Expected Discharge Date:                 Expected Discharge Plan:  Home/Self Care  In-House Referral:     Discharge planning Services  CM Consult  Post Acute Care Choice:    Choice offered to:     DME Arranged:    DME Agency:     HH Arranged:    HH Agency:     Status of Service:  In process, will continue to follow  Medicare Important Message Given:    Date Medicare IM Given:    Medicare IM give by:    Date Additional Medicare IM Given:    Additional Medicare Important Message give by:     If discussed at [place] of Stay Meetings, dates discussed:    Additional Comments:  Dessa Phi, RN 04/13/2015, 11:34 AM

## 2015-04-13 NOTE — Progress Notes (Addendum)
Upper Extremity Vein Map PRELIMINARY      Right Cephalic  Segment Diameter Depth Comment  1. Axilla 5.9mm 13.38mm   2. Mid upper arm 4.31mm 14.49mm branch  3. Above AC 4.22mm 7.72mm   4. In [facility] 6.70mm 3.66mm   5. Below AC 6.76mm 2.13mm   6. Mid forearm 2.5mm 5.17mm Significant narrowing  7. Wrist 1.1mm 5.46mm    mm mm    mm mm    mm mm    Right Basilic  Segment Diameter Depth Comment  1. Axilla mm mm   2. Mid upper arm 4.14mm 26.66mm   3. Above AC 2.62mm 12.66mm   4. In [facility] 2.78mm 10.16mm branch  5. Below AC mm mm   6. Mid forearm mm mm   7. Wrist mm mm    mm mm    mm mm    mm mm      Left Cephalic  Segment Diameter Depth Comment  1. Axilla 4.65mm 10.16mm   2. Mid upper arm 5.18mm 11.70mm   3. Above [facility] 3.76mm 11.59mm   4. In [facility] 5.39mm 2.3mm thrombus  5. Below AC 4.104mm 5.59mm branch  6. Mid forearm 2.81mm 6.69mm   7. Wrist 1.51mm mm    mm mm    mm mm    mm mm    Left Basilic  Segment Diameter Depth Comment  1. Axilla mm mm   2. Mid upper arm 3.73mm 54mm   3. Above AC 2.71mm 63mm   4. In [facility] 2.36mm 81mm branch  5. Below AC mm mm   6. Mid forearm mm mm   7. Wrist mm mm    mm mm    mm mm    mm mm     Landry Mellow, RDMS, RVT 04/13/2015

## 2015-04-14 DIAGNOSIS — E785 Hyperlipidemia, unspecified: Secondary | ICD-10-CM

## 2015-04-14 DIAGNOSIS — K219 Gastro-esophageal reflux disease without esophagitis: Secondary | ICD-10-CM

## 2015-04-14 DIAGNOSIS — E1121 Type 2 diabetes mellitus with diabetic nephropathy: Secondary | ICD-10-CM

## 2015-04-14 DIAGNOSIS — N049 Nephrotic syndrome with unspecified morphologic changes: Secondary | ICD-10-CM

## 2015-04-14 LAB — GLUCOSE, CAPILLARY
Glucose-Capillary: 169 mg/dL — ABNORMAL HIGH (ref 65–99)
Glucose-Capillary: 230 mg/dL — ABNORMAL HIGH (ref 65–99)
Glucose-Capillary: 213 mg/dL — ABNORMAL HIGH (ref 65–99)
Glucose-Capillary: 146 mg/dL — ABNORMAL HIGH (ref 65–99)

## 2015-04-14 LAB — HEMOGLOBIN A1C
Hgb A1c MFr Bld: 6.7 % — ABNORMAL HIGH (ref 4.8–5.6)
Mean Plasma Glucose: 146 mg/dL

## 2015-04-14 MED ORDER — DEXTROSE 5 % IV SOLN
1.5000 g | INTRAVENOUS | Status: AC
  Administered 2015-04-15: 1.5 g via INTRAVENOUS
  Filled 2015-04-14: qty 1.5

## 2015-04-14 NOTE — Progress Notes (Signed)
Patient has surgery for AV fistula placement tomorrow 04/15/15, consent not signed since patient states she has some questions. MD lawson aware and states he will be present tomorrow morning to answer questions.

## 2015-04-14 NOTE — Care Management Note (Signed)
Case Management Note  Patient Details  Name: Rachel Chaney MRN: [id number] Date of Birth: December 15, 1960  Subjective/Objective:  For transfer to [facility]- fistula.                  Action/Plan:d/c plan home.   Expected Discharge Date:                 Expected Discharge Plan:  Home/Self Care  In-House Referral:     Discharge planning Services  CM Consult  Post Acute Care Choice:    Choice offered to:     DME Arranged:    DME Agency:     HH Arranged:    HH Agency:     Status of Service:  In process, will continue to follow  Medicare Important Message Given:    Date Medicare IM Given:    Medicare IM give by:    Date Additional Medicare IM Given:    Additional Medicare Important Message give by:     If discussed at [place] of Stay Meetings, dates discussed:    Additional Comments:  Dessa Phi, RN 04/14/2015, 1:19 PM

## 2015-04-14 NOTE — Progress Notes (Signed)
  [facility] Progress Note   Subjective: 3.5 L out yest on IV and po diuretics. Breathing a little better, no cough or CP.   Filed Vitals:   04/13/15 1508 04/13/15 2101 04/14/15 0018 04/14/15 0510  BP: 162/72 148/60 138/65 132/58  Pulse:  75  73  Temp:  97.3 F (36.3 C)  97.9 F (36.6 C)  TempSrc:  Oral  Oral  Resp:  20  20  Height:      Weight:    125.9 kg (277 lb 9 oz)  SpO2:  96%  95%    Inpatient medications: . atorvastatin  80 mg Oral q1800  . carvedilol  50 mg Oral BID WC  . cloNIDine  0.1 mg Oral BID  . furosemide  160 mg Intravenous Q8H  . heparin  5,000 Units Subcutaneous 3 times per day  . hydrALAZINE  50 mg Oral 4 times per day  . insulin aspart  0-9 Units Subcutaneous TID WC  . loratadine  10 mg Oral Daily  . metolazone  10 mg Oral Daily  . pantoprazole  40 mg Oral Daily     sodium chloride, acetaminophen **OR** acetaminophen, albuterol, hydrALAZINE, ipratropium, ondansetron **OR** ondansetron (ZOFRAN) IV, sodium chloride flush  Exam: Obese pleasant AAF no distress No jvd or bruits Chest bibasilar crackles 1/34 up, improved RRR no MRG Abd soft ntnd no mass or ascites +bs obese GU deferred MS no joint effusions or deformity Ext 2+ bilat LE edema LE's/ no wounds or ulcers Neuro is alert, Ox 3 , nf, no asterixis   Assessment: 1. 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 Progressive disease. Have asked VVS to see for permanent access while here. No indication for HD yet.  Pt diuresing.  2. Fluid overload / pulm edema / anasarca - very good UOP yesterday 3. DM longstanding 4. HTN on 3 BP medications at home 5. Hypernatremia - hypervolemic.  Rx w diuresis for now.  Uosm 320  Plan - cont diuresis. Transfer to [facility] today for access surgery tomorrow.     Kelly Splinter MD [facility] pager [phone]    cell [phone] 04/13/2015, 3:54 PM    Recent Labs Lab 04/11/15 2331 04/12/15 0502 04/13/15 0515  NA 147* 150* 148*  K  4.0 3.8 4.0  CL 114* 116* 112*  CO2 25 26 27   GLUCOSE 55* 97 119*  BUN 39* 40* 41*  CREATININE 3.80* 3.75* 4.04*  CALCIUM 8.3* 8.3* 8.0*    Recent Labs Lab 04/11/15 2331  AST 30  ALT 34  ALKPHOS 94  BILITOT 0.7  PROT 6.3*  ALBUMIN 2.8*    Recent Labs Lab 04/11/15 2246 04/12/15 0502 04/13/15 0515  WBC 15.3* 15.5* 14.7*  HGB 10.8* 10.4* 9.6*  HCT 35.0* 33.4* 32.4*  MCV 92.6 90.5 95.0  PLT 317 294 295

## 2015-04-14 NOTE — Progress Notes (Addendum)
PROGRESS NOTE  Rachel Chaney [id number] DOB: 1960/12/19 DOA: 04/11/2015 PCP: Marina Goodell, MD  Brief summary and H&P 55 year old female with a past medical history significant for diabetes mellitus type 2, chronic kidney disease stage III, and hypertension; who presents with complaints of low back pain worsening shortness of breath. Symptoms of shortness of breath progressively worsened over the course of the weekend. Any significant exertion worsened symptoms.. Patient does not normally use oxygen at home. Husband states that he's noticed that her legs and abdomen had been swelling, and question if her standing on her feet for long periods of time during the day with her job was the cause. States that she previously was admitted to the hospital 4 months ago for similar symptoms, but they are unsure of what was the cause. They reported that she is taking all her medications as advised, but still having swelling. The source of back pain this appears to be more by chronic issue. The only other change recently and that she notes is that she was started on Lantus, but notes that she has not eaten much today. Upon review of records this appears her last weight at her primary care office was 252 pounds and she was found to be 270+ pounds here tonight.  Slowly improving, breathing easier. On high dose of IV lasix and zaroxolyn; renal service on board and patient been transferred to [facility] for vascular evaluation and placement of AVF.  Assessment/Plan: CKD stage IV/ V  From diabetic nephropathy -never followed up with renal after last admit 12/16, says she didn't get an appt at that time. -? Need for ACEi  in outpatient basis, if renal function stabilizes enough for that. -will be transfer to [facility] for VVS evaluation and AVF placement on 04/15/15 -as mentioned below will continue IV lasix and zaroxolyn  -will follow renal service rec's  Fluid overload / pulm edema / anasarca  -renal consulted -high dose  lasix and zaroxolyn as per renal rec's.  -low sodium diet   DM -will continue SSI  HLD -continue statins  GERD -continue PPI  HTN  -fairly well controlled  -continue current antihypertensive regimen   Hypernatremia - -stable -continue treatment with diuretics - renal service on board, will follow rec's  Obesity: -Body mass index is 47.62 kg/(m^2). -low calorie diet and exercise discussed with patient   Code Status: full Family Communication: patient and husband [name] by phone [phone]) [facility]position Plan: [facility] and further treatment of her renal failure, which could potentially require HD. Family Medicine will take over on 04/15/15   Consultants:  Renal  Vascular surgery   Procedures:  AVF placement on 4/6 (by Dr. Kellie Simmering)    HPI/Subjective: Swelling continue slowly to improved; denies CP, nausea and vomiting. Still reporting some SOB and using O2 supplementation.   Objective: Filed Vitals:   04/14/15 0510 04/14/15 1454  BP: 132/58 150/66  Pulse: 73 78  Temp: 97.9 F (36.6 C) 98.2 F (36.8 C)  Resp: 20 22    Intake/Output Summary (Last 24 hours) at 04/14/15 1457 Last data filed at 04/14/15 1455  Gross per 24 hour  Intake    132 ml  Output   4350 ml  Net  -4218 ml   Filed Weights   04/12/15 0900 04/13/15 0700 04/14/15 0510  Weight: 117.935 kg (260 lb) 125.42 kg (276 lb 8 oz) 125.9 kg (277 lb 9 oz)    Exam:   General:  Awake, afebrile and in no  distress. Still with signs of fluid overload. No CP  Cardiovascular: rrr, no rubs or gallops; no murmur  Respiratory: good air movement, no wheezing, positive crackles; scattered rhonchi   Abdomen: +Bs, soft, no tender, no guarding   Musculoskeletal: 2-3+ edema bilaterally  Data Reviewed: Basic Metabolic Panel:  Recent Labs Lab 04/11/15 2331 04/12/15 0502 04/13/15 0515  NA 147* 150* 148*  K 4.0 3.8 4.0  CL 114* 116* 112*  CO2 25 26 27   GLUCOSE  55* 97 119*  BUN 39* 40* 41*  CREATININE 3.80* 3.75* 4.04*  CALCIUM 8.3* 8.3* 8.0*   Liver Function Tests:  Recent Labs Lab 04/11/15 2331  AST 30  ALT 34  ALKPHOS 94  BILITOT 0.7  PROT 6.3*  ALBUMIN 2.8*   CBC:  Recent Labs Lab 04/11/15 2246 04/12/15 0502 04/13/15 0515  WBC 15.3* 15.5* 14.7*  HGB 10.8* 10.4* 9.6*  HCT 35.0* 33.4* 32.4*  MCV 92.6 90.5 95.0  PLT 317 294 295   Cardiac Enzymes:  Recent Labs Lab 04/11/15 2331  TROPONINI 0.03   BNP (last 3 results)  Recent Labs  12/13/14 1700 04/11/15 2245  BNP 171.7* 275.6*   CBG:  Recent Labs Lab 04/13/15 1146 04/13/15 1706 04/13/15 2108 04/14/15 0735 04/14/15 1146  GLUCAP 141* 190* 173* 169* 230*    Studies: No results found.  Scheduled Meds: . atorvastatin  80 mg Oral q1800  . carvedilol  50 mg Oral BID WC  . cloNIDine  0.1 mg Oral BID  . furosemide  160 mg Intravenous Q8H  . heparin  5,000 Units Subcutaneous 3 times per day  . hydrALAZINE  50 mg Oral 4 times per day  . insulin aspart  0-9 Units Subcutaneous TID WC  . loratadine  10 mg Oral Daily  . metolazone  10 mg Oral Daily  . pantoprazole  40 mg Oral Daily   Continuous Infusions:  Antibiotics Given (last 72 hours)    None      Principal Problem:   Acute diastolic (congestive) heart failure (HCC) Active Problems:   Leukocytosis   Uncontrolled hypertension   Hypoglycemia   Acute renal failure superimposed on stage 4 chronic kidney disease ([place])    Time spent: 25 min    Barton Dubois  [facility] Pager [phone] If 7PM-7AM, please contact night-coverage at [email or web address], password [facility] 04/14/2015, 2:57 PM  LOS: 2 days

## 2015-04-14 NOTE — Progress Notes (Signed)
  [facility] Progress Note   Subjective: breathing a little better, UOP 1.2 L yesterday on IV/ po diuretics  Filed Vitals:   04/13/15 1508 04/13/15 2101 04/14/15 0018 04/14/15 0510  BP: 162/72 148/60 138/65 132/58  Pulse:  75  73  Temp:  97.3 F (36.3 C)  97.9 F (36.6 C)  TempSrc:  Oral  Oral  Resp:  20  20  Height:      Weight:    125.9 kg (277 lb 9 oz)  SpO2:  96%  95%    Inpatient medications: . atorvastatin  80 mg Oral q1800  . carvedilol  50 mg Oral BID WC  . cloNIDine  0.1 mg Oral BID  . furosemide  160 mg Intravenous Q8H  . heparin  5,000 Units Subcutaneous 3 times per day  . hydrALAZINE  50 mg Oral 4 times per day  . insulin aspart  0-9 Units Subcutaneous TID WC  . loratadine  10 mg Oral Daily  . metolazone  10 mg Oral Daily  . pantoprazole  40 mg Oral Daily     sodium chloride, acetaminophen **OR** acetaminophen, albuterol, hydrALAZINE, ipratropium, ondansetron **OR** ondansetron (ZOFRAN) IV, sodium chloride flush  Exam: Obese pleasant AAF no distress No rash, cyanosis or gangrene Sclera anicteric, throat clear  No jvd or bruits Chest rales 2/3 up on R, L side clear RRR no MRG Abd soft ntnd no mass or ascites +bs obese GU deferred MS no joint effusions or deformity Ext 2+ bilat LE edema LE's/ no wounds or ulcers Neuro is alert, Ox 3 , nf, no asterixis   Assessment: 1. 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 Progressive disease. Get vein mapping and consider VVS consult for access while here.  2. Fluid overload / pulm edema / anasarca - starting to diurese some 3. DM longstanding 4. HTN on 3 BP medications at home 5. Hypernatremia - hypervolemic.  Rx w diuresis for now.  Uosm 320  Plan - cont diuresis   Kelly Splinter MD [facility] pager [phone]    cell [phone] 04/13/2015, 3:54 PM    Recent Labs Lab 04/11/15 2331 04/12/15 0502 04/13/15 0515  NA 147* 150* 148*  K 4.0 3.8 4.0  CL 114* 116* 112*  CO2 25  26 27   GLUCOSE 55* 97 119*  BUN 39* 40* 41*  CREATININE 3.80* 3.75* 4.04*  CALCIUM 8.3* 8.3* 8.0*    Recent Labs Lab 04/11/15 2331  AST 30  ALT 34  ALKPHOS 94  BILITOT 0.7  PROT 6.3*  ALBUMIN 2.8*    Recent Labs Lab 04/11/15 2246 04/12/15 0502 04/13/15 0515  WBC 15.3* 15.5* 14.7*  HGB 10.8* 10.4* 9.6*  HCT 35.0* 33.4* 32.4*  MCV 92.6 90.5 95.0  PLT 317 294 295

## 2015-04-14 NOTE — Progress Notes (Signed)
Report called to [place] at [facility] . [place]. Brigitte Pulse, RN

## 2015-04-14 NOTE — Progress Notes (Signed)
Physical Therapy Treatment Patient Details Name: Rachel Chaney MRN: [id number] DOB: 10/06/1960 Today's Date: 04/14/2015    History of Present Illness 55 year old female with a past medical history significant for diabetes mellitus type 2, chronic kidney disease stage III, and hypertension and admitted for acute diastolic heart failure and hypoglycemia    PT Comments    Pt used RW today and steadiness improved with RW.  Pt requiring supplemental oxygen at this time.  SATURATION QUALIFICATIONS: (This note is used to comply with regulatory documentation for home oxygen)  Patient Saturations on Room Air at Rest = 84%  Patient Saturations on Room Air while Ambulating = N/A  Patient Saturations on 4 Liters of oxygen while Ambulating = 89-93%  Please briefly explain why patient needs home oxygen: to improve oxygen saturations at rest and during physical exertion.    Follow Up Recommendations  No PT follow up     Equipment Recommendations  Rolling walker with 5" wheels    Recommendations for Other Services       Precautions / Restrictions Precautions Precautions: Fall Precaution Comments: monitor sats    Mobility  Bed Mobility Overal bed mobility: Modified Independent             General bed mobility comments: with HOB elevated  Transfers Overall transfer level: Needs assistance Equipment used: Rolling walker (2 wheeled) Transfers: Sit to/from Stand Sit to Stand: Min guard         General transfer comment: provided RW for a little more support today (pt aware she could leave RW once in hallway if feeling steady however pt kept RW for mobility)  Ambulation/Gait Ambulation/Gait assistance: Min guard Ambulation Distance (Feet): 120 Feet Assistive device: Rolling walker (2 wheeled) Gait Pattern/deviations: Step-through pattern;Decreased stride length     General Gait Details: one standing rest break due to fatigue, improved stability with RW compared to  previous session, SpO2 dropped to 86% on 3L however SPO2 89-93% on 4L O2 [place] during ambulation   Stairs            Wheelchair Mobility    Modified Rankin (Stroke Patients Only)       Balance                                    Cognition Arousal/Alertness: Awake/alert Behavior During Therapy: WFL for tasks assessed/performed Overall Cognitive Status: Within Functional Limits for tasks assessed                      Exercises      General Comments        Pertinent Vitals/Pain Pain Assessment: No/denies pain    Home Living                      Prior Function            PT Goals (current goals can now be found in the care plan section) Progress towards PT goals: Progressing toward goals    Frequency  Min 3X/week    PT Plan Current plan remains appropriate    Co-evaluation             End of Session Equipment Utilized During Treatment: Oxygen Activity Tolerance: Patient tolerated treatment well Patient left: in bed;with call bell/phone within reach     Time: 1323-1335 PT Time Calculation (min) (ACUTE ONLY): 12 min  Charges:  $Gait Training: 8-22  mins                    G Codes:      Ruven Corradi,KATHrine E 04-25-15, 3:02 PM Carmelia Bake, PT, DPT April 25, 2015 Pager: [phone]

## 2015-04-15 ENCOUNTER — Encounter (HOSPITAL_COMMUNITY): Payer: Self-pay | Admitting: Anesthesiology

## 2015-04-15 ENCOUNTER — Encounter (HOSPITAL_COMMUNITY)
Admission: EM | Disposition: A | Discharge status: Home / Self Care | Payer: Self-pay | Source: Home / Self Care | Attending: Family Medicine

## 2015-04-15 ENCOUNTER — Inpatient Hospital Stay (HOSPITAL_COMMUNITY): Payer: Commercial Managed Care - HMO | Admitting: Anesthesiology

## 2015-04-15 ENCOUNTER — Inpatient Hospital Stay (HOSPITAL_COMMUNITY): Payer: Commercial Managed Care - HMO

## 2015-04-15 DIAGNOSIS — I5031 Acute diastolic (congestive) heart failure: Secondary | ICD-10-CM

## 2015-04-15 DIAGNOSIS — I5032 Chronic diastolic (congestive) heart failure: Secondary | ICD-10-CM | POA: Insufficient documentation

## 2015-04-15 DIAGNOSIS — I509 Heart failure, unspecified: Secondary | ICD-10-CM

## 2015-04-15 DIAGNOSIS — N185 Chronic kidney disease, stage 5: Secondary | ICD-10-CM

## 2015-04-15 DIAGNOSIS — E1122 Type 2 diabetes mellitus with diabetic chronic kidney disease: Secondary | ICD-10-CM

## 2015-04-15 DIAGNOSIS — N184 Chronic kidney disease, stage 4 (severe): Secondary | ICD-10-CM

## 2015-04-15 DIAGNOSIS — I10 Essential (primary) hypertension: Secondary | ICD-10-CM

## 2015-04-15 DIAGNOSIS — N179 Acute kidney failure, unspecified: Secondary | ICD-10-CM

## 2015-04-15 HISTORY — PX: AV FISTULA PLACEMENT: SHX1204

## 2015-04-15 LAB — BASIC METABOLIC PANEL
Anion gap: 13 (ref 5–15)
BUN: 45 mg/dL — ABNORMAL HIGH (ref 6–20)
CO2: 30 mmol/L (ref 22–32)
Calcium: 8.4 mg/dL — ABNORMAL LOW (ref 8.9–10.3)
Chloride: 103 mmol/L (ref 101–111)
Creatinine, Ser: 4.15 mg/dL — ABNORMAL HIGH (ref 0.44–1.00)
GFR calc Af Amer: 13 mL/min — ABNORMAL LOW (ref >60)
GFR calc non Af Amer: 11 mL/min — ABNORMAL LOW (ref >60)
Glucose, Bld: 161 mg/dL — ABNORMAL HIGH (ref 65–99)
Potassium: 3.7 mmol/L (ref 3.5–5.1)
Sodium: 146 mmol/L — ABNORMAL HIGH (ref 135–145)

## 2015-04-15 LAB — RENAL FUNCTION PANEL
Albumin: 2.3 g/dL — ABNORMAL LOW (ref 3.5–5.0)
Anion gap: 14 (ref 5–15)
BUN: 45 mg/dL — ABNORMAL HIGH (ref 6–20)
CO2: 30 mmol/L (ref 22–32)
Calcium: 8.4 mg/dL — ABNORMAL LOW (ref 8.9–10.3)
Chloride: 103 mmol/L (ref 101–111)
Creatinine, Ser: 4.13 mg/dL — ABNORMAL HIGH (ref 0.44–1.00)
GFR calc Af Amer: 13 mL/min — ABNORMAL LOW (ref >60)
GFR calc non Af Amer: 11 mL/min — ABNORMAL LOW (ref >60)
Glucose, Bld: 161 mg/dL — ABNORMAL HIGH (ref 65–99)
Phosphorus: 5.2 mg/dL — ABNORMAL HIGH (ref 2.5–4.6)
Potassium: 3.7 mmol/L (ref 3.5–5.1)
Sodium: 147 mmol/L — ABNORMAL HIGH (ref 135–145)

## 2015-04-15 LAB — GLUCOSE, CAPILLARY
Glucose-Capillary: 151 mg/dL — ABNORMAL HIGH (ref 65–99)
Glucose-Capillary: 156 mg/dL — ABNORMAL HIGH (ref 65–99)
Glucose-Capillary: 191 mg/dL — ABNORMAL HIGH (ref 65–99)
Glucose-Capillary: 199 mg/dL — ABNORMAL HIGH (ref 65–99)
Glucose-Capillary: 219 mg/dL — ABNORMAL HIGH (ref 65–99)
Glucose-Capillary: 184 mg/dL — ABNORMAL HIGH (ref 65–99)

## 2015-04-15 LAB — CBC
HCT: 31.9 % — ABNORMAL LOW (ref 36.0–46.0)
Hemoglobin: 9.7 g/dL — ABNORMAL LOW (ref 12.0–15.0)
MCH: 28.7 pg (ref 26.0–34.0)
MCHC: 30.4 g/dL (ref 30.0–36.0)
MCV: 94.4 fL (ref 78.0–100.0)
Platelets: 261 10*3/uL (ref 150–400)
RBC: 3.38 MIL/uL — ABNORMAL LOW (ref 3.87–5.11)
RDW: 16.3 % — ABNORMAL HIGH (ref 11.5–15.5)
WBC: 14.4 10*3/uL — ABNORMAL HIGH (ref 4.0–10.5)

## 2015-04-15 LAB — PROTIME-INR
INR: 1.02 (ref 0.00–1.49)
Prothrombin Time: 13.6 seconds (ref 11.6–15.2)

## 2015-04-15 LAB — SURGICAL PCR SCREEN
MRSA, PCR: NEGATIVE
Staphylococcus aureus: NEGATIVE

## 2015-04-15 SURGERY — ARTERIOVENOUS (AV) FISTULA CREATION
Anesthesia: Monitor Anesthesia Care | Site: Arm Upper | Laterality: Left

## 2015-04-15 MED ORDER — ONDANSETRON HCL 4 MG/2ML IJ SOLN: 4.0000 mg | Freq: Once | INTRAMUSCULAR | Status: DC | PRN

## 2015-04-15 MED ORDER — FENTANYL CITRATE (PF) 100 MCG/2ML IJ SOLN
INTRAMUSCULAR | Status: DC | PRN
  Administered 2015-04-15: 100 ug via INTRAVENOUS
  Administered 2015-04-15: 50 ug via INTRAVENOUS

## 2015-04-15 MED ORDER — 0.9 % SODIUM CHLORIDE (POUR BTL) OPTIME
TOPICAL | Status: DC | PRN
  Administered 2015-04-15: 1000 mL

## 2015-04-15 MED ORDER — SODIUM CHLORIDE 0.9 % IV SOLN
INTRAVENOUS | Status: DC
  Administered 2015-04-15: 25 mL/h via INTRAVENOUS

## 2015-04-15 MED ORDER — MIDAZOLAM HCL 2 MG/2ML IJ SOLN
INTRAMUSCULAR | Status: AC
  Filled 2015-04-15: qty 2

## 2015-04-15 MED ORDER — PROPOFOL 10 MG/ML IV BOLUS
INTRAVENOUS | Status: DC | PRN
  Administered 2015-04-15: 200 mg via INTRAVENOUS

## 2015-04-15 MED ORDER — INSULIN ASPART 100 UNIT/ML ~~LOC~~ SOLN
0.0000 [IU] | Freq: Three times a day (TID) | SUBCUTANEOUS | Status: DC
Start: 2015-04-15 — End: 2015-04-18
  Administered 2015-04-15: 2 [IU] via SUBCUTANEOUS
  Administered 2015-04-15: 3 [IU] via SUBCUTANEOUS
  Administered 2015-04-16: 5 [IU] via SUBCUTANEOUS
  Administered 2015-04-16: 2 [IU] via SUBCUTANEOUS
  Administered 2015-04-17: 3 [IU] via SUBCUTANEOUS
  Administered 2015-04-17: 2 [IU] via SUBCUTANEOUS
  Administered 2015-04-17: 1 [IU] via SUBCUTANEOUS
  Administered 2015-04-18: 2 [IU] via SUBCUTANEOUS

## 2015-04-15 MED ORDER — LIDOCAINE-EPINEPHRINE (PF) 1 %-1:200000 IJ SOLN
INTRAMUSCULAR | Status: AC
  Filled 2015-04-15: qty 30

## 2015-04-15 MED ORDER — HYDROMORPHONE HCL 1 MG/ML IJ SOLN: 0.5000 mg | INTRAMUSCULAR | Status: DC | PRN

## 2015-04-15 MED ORDER — FENTANYL CITRATE (PF) 250 MCG/5ML IJ SOLN
INTRAMUSCULAR | Status: AC
  Filled 2015-04-15: qty 5

## 2015-04-15 MED ORDER — SODIUM CHLORIDE 0.9 % IV SOLN
INTRAVENOUS | Status: DC | PRN
  Administered 2015-04-15: 10:00:00 via INTRAVENOUS

## 2015-04-15 MED ORDER — SODIUM CHLORIDE 0.9 % IV SOLN
INTRAVENOUS | Status: DC | PRN
  Administered 2015-04-15: 10:00:00

## 2015-04-15 MED ORDER — MIDAZOLAM HCL 5 MG/5ML IJ SOLN
INTRAMUSCULAR | Status: DC | PRN
  Administered 2015-04-15: 2 mg via INTRAVENOUS

## 2015-04-15 MED ORDER — LIDOCAINE-EPINEPHRINE (PF) 1 %-1:200000 IJ SOLN: INTRAMUSCULAR | Status: DC | PRN

## 2015-04-15 MED ORDER — FUROSEMIDE 10 MG/ML IJ SOLN
160.0000 mg | Freq: Four times a day (QID) | INTRAVENOUS | Status: DC
  Administered 2015-04-15 – 2015-04-17 (×6): 160 mg via INTRAVENOUS
  Filled 2015-04-15 (×9): qty 16

## 2015-04-15 SURGICAL SUPPLY — 24 items
ARMBAND PINK RESTRICT EXTREMIT (MISCELLANEOUS) ×3 IMPLANT
CANISTER SUCTION 2500CC (MISCELLANEOUS) ×3 IMPLANT
CLIP TI MEDIUM 6 (CLIP) ×3 IMPLANT
CLIP TI WIDE RED SMALL 6 (CLIP) ×6 IMPLANT
COVER PROBE W GEL 5X96 (DRAPES) IMPLANT
DRAIN PENROSE 1/4X12 LTX STRL (WOUND CARE) ×3 IMPLANT
ELECT REM PT RETURN 9FT ADLT (ELECTROSURGICAL) ×3
ELECTRODE REM PT RTRN 9FT ADLT (ELECTROSURGICAL) ×1 IMPLANT
GEL ULTRASOUND 20GR AQUASONIC (MISCELLANEOUS) IMPLANT
GLOVE SS BIOGEL STRL SZ 7 (GLOVE) ×1 IMPLANT
GLOVE SUPERSENSE BIOGEL SZ 7 (GLOVE) ×2
GOWN STRL REUS W/ TWL LRG LVL3 (GOWN DISPOSABLE) ×3 IMPLANT
GOWN STRL REUS W/TWL LRG LVL3 (GOWN DISPOSABLE) ×6
KIT BASIN OR (CUSTOM PROCEDURE TRAY) ×3 IMPLANT
KIT ROOM TURNOVER OR (KITS) ×3 IMPLANT
LIQUID BAND (GAUZE/BANDAGES/DRESSINGS) ×3 IMPLANT
NS IRRIG 1000ML POUR BTL (IV SOLUTION) ×3 IMPLANT
PACK CV ACCESS (CUSTOM PROCEDURE TRAY) ×3 IMPLANT
PAD ARMBOARD 7.5X6 YLW CONV (MISCELLANEOUS) ×6 IMPLANT
SUT PROLENE 6 0 BV (SUTURE) ×3 IMPLANT
SUT VIC AB 3-0 SH 27 (SUTURE) ×2
SUT VIC AB 3-0 SH 27X BRD (SUTURE) ×1 IMPLANT
UNDERPAD 30X30 INCONTINENT (UNDERPADS AND DIAPERS) ×3 IMPLANT
WATER STERILE IRR 1000ML POUR (IV SOLUTION) ×3 IMPLANT

## 2015-04-15 NOTE — Op Note (Signed)
OPERATIVE REPORT  Date of Surgery: 04/11/2015 - 04/15/2015  Surgeon: Tinnie Gens, MD  Assistant: Gerri Lins PA  Pre-op Diagnosis: Stage IV Chronic Kidney Disease N18.4  Post-op Diagnosis: Stage IV Chronic Kidney Disease N18.4  Procedure: Procedure(s): ARTERIOVENOUS (AV) FISTULA CREATION-Left brachial-cephalic Anesthesia: LMA  EBL: Minimal  Complications: None  Procedure Details: The patient was taken the operating room placed in supine position at which time satisfactory general-LMA anesthesia was administered. The left upper external ears prepped Betadine scrub and solution draped in routine sterile manner.  Short transverse incision was made in the antecubital area brachial artery exposed and circled with Vesseloops. It was a small vessel. The radial and ulnar arteries at artery bifurcated and the ulnar was at a deeper plane. The artery however was 3-3-1/2 mm in size with an excellent pulse. Cephalic vein was ligated distally transected. The radial artery was occluded proximally and distally a 15 blade extended with Potts scissors. It would accept a 3-1/2 dilator proximally. Vein was carefully measured spatulated and anastomosed end-to-side with 6-0 Prolene. Vesseloops released was good pulse and palpable thrill in the fistula with slight diminution of flow in the radial artery distally with the fistula open. This improved with compression fistula. Adequate hemostasis was achieved  Wound closed in layers with Vicryl subcuticular fashion with Dermabond patient taken to recovery room in satisfactory condition   Tinnie Gens, MD 04/15/2015 11:20 AM

## 2015-04-15 NOTE — Anesthesia Preprocedure Evaluation (Signed)
Anesthesia Evaluation  Patient identified by MRN, date of birth, ID band Patient awake    Reviewed: Allergy & Precautions, NPO status , Patient's Chart, lab work & pertinent test results  Airway Mallampati: II  TM Distance: >3 FB Neck ROM: Full    Dental   Pulmonary shortness of breath,    Pulmonary exam normal        Cardiovascular hypertension, +CHF  Normal cardiovascular exam     Neuro/Psych    GI/Hepatic GERD  ,  Endo/Other  diabetes, Type 2, Insulin DependentMorbid obesity  Renal/GU Dialysis, ESRF and CRFRenal disease     Musculoskeletal   Abdominal   Peds  Hematology  (+) anemia ,   Anesthesia Other Findings   Reproductive/Obstetrics                             Anesthesia Physical Anesthesia Plan  ASA: III  Anesthesia Plan: MAC   Post-op Pain Management:    Induction: Intravenous  Airway Management Planned: Simple Face Mask  Additional Equipment:   Intra-op Plan:   Post-operative Plan:   Informed Consent: I have reviewed the patients History and Physical, chart, labs and discussed the procedure including the risks, benefits and alternatives for the proposed anesthesia with the patient or authorized representative who has indicated his/her understanding and acceptance.     Plan Discussed with: CRNA, Anesthesiologist and Surgeon  Anesthesia Plan Comments:         Anesthesia Quick Evaluation

## 2015-04-15 NOTE — Progress Notes (Signed)
Report called to Caren Griffins in Short Stay.  CBG 156 per Caren Griffins do not give insulin coverage.

## 2015-04-15 NOTE — H&P (Signed)
Vascular Surgery H&P  Chief Complaint: Needs vascular access-chronic kidney disease stage V  HPI: Rachel Chaney is a 55 y.o. female who presents for evaluation of vascular access. Patient has chronic kidney disease stage V and will likely need soon need hemodialysis. She has not had hemodialysis today. She is right handed. She does have type 1 diabetes mellitus. She denies pain or numbness in the left hand.   Past Medical History  Diagnosis Date  . Diabetes mellitus   . Hyperlipidemia   . Hypertension    Past Surgical History  Procedure Laterality Date  . Eye surgery Right     retinal detachment   Social History   Social History  . Marital Status: Married    Spouse Name: N/A  . Number of Children: N/A  . Years of Education: N/A   Social History Main Topics  . Smoking status: Never Smoker   . Smokeless tobacco: Never Used  . Alcohol Use: No  . Drug Use: None  . Sexual Activity: Not Asked   Other Topics Concern  . None   Social History Narrative   Family History  Problem Relation Age of Onset  . Heart disease Mother   . Heart disease Father    No Known Allergies Prior to Admission medications   Medication Sig Start Date End Date Taking? Authorizing Provider  acetaminophen (TYLENOL) 500 MG tablet Take 1,000 mg by mouth every 6 (six) hours as needed for pain.   Yes Historical Provider, MD  atorvastatin (LIPITOR) 80 MG tablet Take 1 tablet (80 mg total) by mouth daily. 08/06/14  Yes Alyssa A Haney, MD  calcitRIOL (ROCALTROL) 0.25 MCG capsule TAKE 1 CAPSULE (0.25 MCG TOTAL) BY MOUTH DAILY. 02/19/15  Yes Alyssa A Lincoln Brigham, MD  carvedilol (COREG) 25 MG tablet Take 2 tablets (50 mg total) by mouth 2 (two) times daily with a meal. 02/18/15  Yes Alyssa A Haney, MD  cetirizine (ZYRTEC) 10 MG tablet Take 10 mg by mouth daily as needed for allergies.   Yes Historical Provider, MD  cloNIDine (CATAPRES) 0.1 MG tablet Take 1 tablet (0.1 mg total) by mouth 2 (two) times daily. 02/18/15   Yes Alyssa A Lincoln Brigham, MD  furosemide (LASIX) 80 MG tablet Take 2 tablets (160 mg total) by mouth 2 (two) times daily. 02/18/15  Yes Alyssa A Haney, MD  glucose blood (ONE TOUCH ULTRA TEST) test strip Use as instructed 10/27/14  Yes Alyssa A Haney, MD  hydrALAZINE (APRESOLINE) 50 MG tablet Take 1 tablet (50 mg total) by mouth every 6 (six) hours. 12/18/14  Yes Hillary Corinda Gubler, MD  Insulin Pen Needle 29G X 12MM MISC Inject 1 pen into the skin 2 (two) times daily. Check blood sugar daily. 04/29/13  Yes Waldemar Dickens, MD  Insulin Pen Needle 31G X 5 MM MISC Use as directed for insulin injection bid 01/07/14  Yes Dickie La, MD  LANTUS SOLOSTAR 100 UNIT/ML Solostar Pen INJECT 50 UNITS IN THE MORNING AND 30 UNITS IN THE EVENING 07/21/14  Yes Alyssa Bishop Dublin, MD  ONETOUCH DELICA LANCETS 99991111 MISC 1 Device by Does not apply route as needed (to check blood glucose). 05/02/13  Yes Waldemar Dickens, MD  Syringe, Disposable, 1 ML MISC Use as directed 07/01/14  Yes Veatrice Bourbon, MD  diclofenac sodium (VOLTAREN) 1 % GEL Apply 2 g topically 4 (four) times daily. Patient not taking: Reported on 02/18/2015 05/21/14   Frazier Richards, MD  omeprazole (PRILOSEC) 20 MG capsule  Take 1 capsule (20 mg total) by mouth daily as needed (acid reflux). Patient not taking: Reported on 02/18/2015 12/09/13   [name], MD     Positive ROS:   All other systems have been reviewed and were otherwise negative with the exception of those mentioned in the HPI and as above.  Physical Exam: Filed Vitals:   04/15/15 0014 04/15/15 0524  BP: 141/53 162/69  Pulse: 74 80  Temp:  98.9 F (37.2 C)  Resp:      General: Alert, no acute distress HEENT: Normal for age Cardiovascular: Regular rate and rhythm. Carotid pulses 2+, no bruits audible Respiratory: Clear to auscultation. No cyanosis, no use of accessory musculature GI: No organomegaly, abdomen is soft and non-tender Skin: No lesions in the area of chief  complaint Neurologic: Sensation intact distally Psychiatric: Patient is competent for consent with normal mood and affect Musculoskeletal: No obvious deformities Extremities: Left upper extremity with 3+ brachial and 2-3+ radial pulse palpable. Both upper extremities well-perfused.    Imaging reviewed: Vein mapping performed in both upper extremities. It appears that the cephalic vein is satisfactory in the left upper arm and borderline in the forearm.   Assessment/Plan:  Plan creation left brachial-cephalic AV fistula Risks and benefits discussed with patient and she would like to proceed   Tinnie Gens, MD 04/15/2015 8:45 AM

## 2015-04-15 NOTE — Progress Notes (Signed)
FPTS Interim Progress Note  Evaluated patient after arrival from [facility]. She reports improvement in respiratory status since beginning Lasix. Breathing comfortably on 3L [place]. Has no complaints at this time. Family Practice will assume care of patient in AM (04/15/15).    Verner Mould, MD 04/15/2015, 12:28 AM PGY-1, [facility] pager [phone]

## 2015-04-15 NOTE — Anesthesia Procedure Notes (Signed)
Procedure Name: LMA Insertion Performed by: Aryssa Rosamond J Pre-anesthesia Checklist: Patient identified, Emergency Drugs available, Suction available, Patient being monitored and Timeout performed Patient Re-evaluated:Patient Re-evaluated prior to inductionOxygen Delivery Method: Circle system utilized Preoxygenation: Pre-oxygenation with 100% oxygen Intubation Type: IV induction Ventilation: Mask ventilation without difficulty LMA: LMA inserted LMA Size: 5.0 Number of attempts: 1 Placement Confirmation: positive ETCO2,  CO2 detector and breath sounds checked- equal and bilateral Tube secured with: Tape Dental Injury: Teeth and Oropharynx as per pre-operative assessment        

## 2015-04-15 NOTE — Transfer of Care (Signed)
Immediate Anesthesia Transfer of Care Note  Patient: Early Chars  Procedure(s) Performed: Procedure(s): ARTERIOVENOUS (AV) FISTULA CREATION (Left)  Patient Location: PACU  Anesthesia Type:General  Level of Consciousness: awake, alert  and oriented  Airway & Oxygen Therapy: Patient Spontanous Breathing and Patient connected to face mask oxygen  Post-op Assessment: Report given to RN and Post -op Vital signs reviewed and stable  Post vital signs: Reviewed and stable  Last Vitals:  Filed Vitals:   04/15/15 0014 04/15/15 0524  BP: 141/53 162/69  Pulse: 74 80  Temp:  37.2 C  Resp:      Complications: No apparent anesthesia complications

## 2015-04-15 NOTE — Progress Notes (Signed)
Family Medicine Teaching Service Daily Progress Note Intern Pager: [phone]  Patient name: Rachel Chaney Medical record number: [id number] Date of birth: [birth year] Age: 55 y.o. Gender: female  Primary Care Provider: Marina Goodell, MD Consultants: nephrology Code Status: FULL  Pt Overview and Major Events to Date:  4/5 - presented to [facility] ED for SOB; transferred to [facility], admitted to [facility] and Plan: Rachel Chaney is a 55 yo F presenting with worsening SOB, found to have worsening CKD. PMH includes Type II DM, CKD, HTN.  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 Cr 4.04 (4/04).  - Nephrology following - appreciate recs - AVF placement today  HFpEF: Fluid overloaded on admission to [facility]. Last echo with EF 60-65% (04/2015). Has been receiving IV Lasix and zaroxolyn for four days prior to admission to [facility]. Respiratory status continues to improve with diuresis. Weight 277 today (up 3 pounds from 274 on admission to [facility]). UOP 1.7L yesterday; 6.3L net UOP since admission. Improvement in respiratory status; now requiring only 2L [place].  - Continue IV Lasix 160mg  TID, metolazone 10 mg qd  HTN: BP 141/53, 162/69 overnight - Continue carvedilol, clonidine, hydralazine  Type II DM: CBGs 151, 146 overnight - Continue Novolog meal coverage  FEN/GI: Protonix PPx: subQ heparin  Disposition: home pending medical improvement   Subjective:   Brief HPI: Patient initially presented to [facility] four days ago with worsening shortness of breath. She also reported abdominal and lower extremity swelling. She was found to be in acute diastolic heart failure and started on IV Lasix and zaroxolyn. She was also found to have worsening CKD (now stage IV/V) likely 2/2 diabetic nephropathy. She was subsequently transferred to [facility] for AVF placement.  Patient reports continued improvement in respiratory status today. She was able to wean to 2L [place] overnight. She is  prepared for her procedure today and has no complaints.   Objective: Temp:  [98.2 F (36.8 C)-98.9 F (37.2 C)] 98.9 F (37.2 C) (04/06 0524) Pulse Rate:  [74-80] 80 (04/06 0524) Resp:  [18-22] 18 (04/05 2041) BP: (141-162)/(53-70) 162/69 mmHg (04/06 0524) SpO2:  [94 %-100 %] 100 % (04/06 0524) Physical Exam: General: sitting up in bed in NAD Cardiovascular: distant heart sounds, RRR, no murmurs appreciated Respiratory: Scattered rhonchi, comfortable work of breathing on 2L [place] Abdomen: Soft, non-distended, non-tender, +BS Extremities: 2+ pitting edema to knees bilaterally Neuro: A&Ox3, no focal deficits Psych: appropriate mood and affect  Laboratory:  Recent Labs Lab 04/12/15 0502 04/13/15 0515 04/15/15 0617  WBC 15.5* 14.7* 14.4*  HGB 10.4* 9.6* 9.7*  HCT 33.4* 32.4* 31.9*  PLT 294 295 261    Recent Labs Lab 04/11/15 2331 04/12/15 0502 04/13/15 0515 04/15/15 0617  NA 147* 150* 148* 147*  146*  K 4.0 3.8 4.0 3.7  3.7  CL 114* 116* 112* 103  103  CO2 25 26 27 30  30   BUN 39* 40* 41* 45*  45*  CREATININE 3.80* 3.75* 4.04* 4.13*  4.15*  CALCIUM 8.3* 8.3* 8.0* 8.4*  8.4*  PROT 6.3*  --   --   --   BILITOT 0.7  --   --   --   ALKPHOS 94  --   --   --   ALT 34  --   --   --   AST 30  --   --   --   GLUCOSE 55* 97 119* 161*  161*   Imaging/Diagnostic Tests: Dg  Chest 2 View 04/11/2015  CLINICAL DATA:  Shortness of breath beginning at 10 p.m. tonight. History of hypertension, hyperlipidemia and diabetes. EXAM: CHEST  2 VIEW. IMPRESSION: Similar cardiomegaly, interstitial and alveolar airspace opacities compatible with pulmonary edema.   US Renal 04/12/2015  CLINICAL DATA:  55 year old female with acute renal failure. EXAM: RENAL / URINARY TRACT ULTRASOUND COMPLETE  IMPRESSION: Upper limits of normal echogenicity of both kidneys which may represent medical renal disease. No evidence of hydronephrosis or solid mass.   Verner Mould, MD 04/15/2015, 11:14  AM PGY-1, [place] Intern pager: [phone], text pages welcome

## 2015-04-15 NOTE — Anesthesia Postprocedure Evaluation (Signed)
Anesthesia Post Note  Patient: Rachel Chaney  Procedure(s) Performed: Procedure(s) (LRB): ARTERIOVENOUS (AV) FISTULA CREATION (Left)  Patient location during evaluation: PACU Anesthesia Type: General Level of consciousness: awake, awake and alert and patient cooperative Pain management: pain level controlled Vital Signs Assessment: post-procedure vital signs reviewed and stable Respiratory status: spontaneous breathing and respiratory function stable Cardiovascular status: blood pressure returned to baseline and stable Anesthetic complications: no    Last Vitals:  Filed Vitals:   04/15/15 1203 04/15/15 1215  BP: 164/96   Pulse:  72  Temp:    Resp:  16    Last Pain:  Filed Vitals:   04/15/15 1216  PainSc: 2                  Maclovio Henson EDWARD

## 2015-04-16 ENCOUNTER — Inpatient Hospital Stay (HOSPITAL_COMMUNITY): Payer: Commercial Managed Care - HMO

## 2015-04-16 ENCOUNTER — Encounter (HOSPITAL_COMMUNITY): Payer: Self-pay | Admitting: General Surgery

## 2015-04-16 LAB — HEPATITIS B SURFACE ANTIGEN: Hepatitis B Surface Ag: NEGATIVE

## 2015-04-16 LAB — GLUCOSE, CAPILLARY
Glucose-Capillary: 196 mg/dL — ABNORMAL HIGH (ref 65–99)
Glucose-Capillary: 291 mg/dL — ABNORMAL HIGH (ref 65–99)
Glucose-Capillary: 195 mg/dL — ABNORMAL HIGH (ref 65–99)

## 2015-04-16 LAB — RENAL FUNCTION PANEL
Albumin: 2.2 g/dL — ABNORMAL LOW (ref 3.5–5.0)
Anion gap: 13 (ref 5–15)
BUN: 49 mg/dL — ABNORMAL HIGH (ref 6–20)
CO2: 30 mmol/L (ref 22–32)
Calcium: 8.2 mg/dL — ABNORMAL LOW (ref 8.9–10.3)
Chloride: 98 mmol/L — ABNORMAL LOW (ref 101–111)
Creatinine, Ser: 4.81 mg/dL — ABNORMAL HIGH (ref 0.44–1.00)
GFR calc Af Amer: 11 mL/min — ABNORMAL LOW (ref >60)
GFR calc non Af Amer: 9 mL/min — ABNORMAL LOW (ref >60)
Glucose, Bld: 218 mg/dL — ABNORMAL HIGH (ref 65–99)
Phosphorus: 7.5 mg/dL — ABNORMAL HIGH (ref 2.5–4.6)
Potassium: 4.3 mmol/L (ref 3.5–5.1)
Sodium: 141 mmol/L (ref 135–145)

## 2015-04-16 MED ORDER — NALOXONE HCL 0.4 MG/ML IJ SOLN
INTRAMUSCULAR | Status: AC
  Filled 2015-04-16: qty 1

## 2015-04-16 MED ORDER — SODIUM CHLORIDE 0.9 % IV SOLN: 100.0000 mL | INTRAVENOUS | Status: DC | PRN

## 2015-04-16 MED ORDER — GELATIN ABSORBABLE 12-7 MM EX MISC
CUTANEOUS | Status: AC
  Filled 2015-04-16: qty 1

## 2015-04-16 MED ORDER — ONDANSETRON HCL 4 MG/2ML IJ SOLN
INTRAMUSCULAR | Status: AC
  Administered 2015-04-16: 4 mg via INTRAVENOUS
  Filled 2015-04-16: qty 2

## 2015-04-16 MED ORDER — HEPARIN SODIUM (PORCINE) 1000 UNIT/ML DIALYSIS: 1000.0000 [IU] | INTRAMUSCULAR | Status: DC | PRN

## 2015-04-16 MED ORDER — ALTEPLASE 2 MG IJ SOLR: 2.0000 mg | Freq: Once | INTRAMUSCULAR | Status: DC | PRN

## 2015-04-16 MED ORDER — PENTAFLUOROPROP-TETRAFLUOROETH EX AERO: 1.0000 "application " | INHALATION_SPRAY | CUTANEOUS | Status: DC | PRN

## 2015-04-16 MED ORDER — FLUMAZENIL 0.5 MG/5ML IV SOLN
INTRAVENOUS | Status: AC
  Filled 2015-04-16: qty 5

## 2015-04-16 MED ORDER — DEXTROSE 5 % IV SOLN
3.0000 g | INTRAVENOUS | Status: AC
  Administered 2015-04-16: 3 g via INTRAVENOUS
  Filled 2015-04-16: qty 3000

## 2015-04-16 MED ORDER — LIDOCAINE HCL 1 % IJ SOLN
INTRAMUSCULAR | Status: AC | PRN
  Administered 2015-04-16: 20 mL via INTRADERMAL

## 2015-04-16 MED ORDER — LIDOCAINE-PRILOCAINE 2.5-2.5 % EX CREA: 1.0000 "application " | TOPICAL_CREAM | CUTANEOUS | Status: DC | PRN

## 2015-04-16 MED ORDER — NALOXONE HCL 0.4 MG/ML IJ SOLN
INTRAMUSCULAR | Status: AC | PRN
  Administered 2015-04-16: 0.4 mg via INTRAVENOUS

## 2015-04-16 MED ORDER — MIDAZOLAM HCL 2 MG/2ML IJ SOLN
INTRAMUSCULAR | Status: AC
  Filled 2015-04-16: qty 4

## 2015-04-16 MED ORDER — MIDAZOLAM HCL 2 MG/2ML IJ SOLN
INTRAMUSCULAR | Status: AC | PRN
  Administered 2015-04-16: 1 mg via INTRAVENOUS

## 2015-04-16 MED ORDER — FENTANYL CITRATE (PF) 100 MCG/2ML IJ SOLN
INTRAMUSCULAR | Status: AC | PRN
  Administered 2015-04-16: 50 ug via INTRAVENOUS

## 2015-04-16 MED ORDER — FENTANYL CITRATE (PF) 100 MCG/2ML IJ SOLN
INTRAMUSCULAR | Status: AC
  Filled 2015-04-16: qty 4

## 2015-04-16 MED ORDER — HEPARIN SODIUM (PORCINE) 5000 UNIT/ML IJ SOLN
5000.0000 [IU] | Freq: Three times a day (TID) | INTRAMUSCULAR | Status: DC
  Administered 2015-04-17 – 2015-04-19 (×6): 5000 [IU] via SUBCUTANEOUS
  Filled 2015-04-16 (×6): qty 1

## 2015-04-16 MED ORDER — LIDOCAINE HCL (PF) 1 % IJ SOLN: 5.0000 mL | INTRAMUSCULAR | Status: DC | PRN

## 2015-04-16 MED ORDER — LIDOCAINE HCL 1 % IJ SOLN
INTRAMUSCULAR | Status: AC
  Filled 2015-04-16: qty 20

## 2015-04-16 MED ORDER — FLUMAZENIL 0.5 MG/5ML IV SOLN
INTRAVENOUS | Status: AC | PRN
  Administered 2015-04-16: 0.5 mg via INTRAVENOUS

## 2015-04-16 MED ORDER — HEPARIN SODIUM (PORCINE) 1000 UNIT/ML IJ SOLN
INTRAMUSCULAR | Status: AC
  Filled 2015-04-16: qty 1

## 2015-04-16 NOTE — Progress Notes (Signed)
  [facility] Progress Note   Subjective: nauseous this am. 650 cc out yest on max diuretics  Filed Vitals:   04/15/15 2110 04/16/15 0157 04/16/15 0605 04/16/15 0802  BP: 142/64  127/54 149/61  Pulse: 69  67 73  Temp: 98.4 F (36.9 C)  97.4 F (36.3 C) 97.6 F (36.4 C)  TempSrc: Oral  Oral Oral  Resp: 18  18 12   Height:      Weight:  116.4 kg (256 lb 9.9 oz)    SpO2: 97%  97% 95%    Inpatient medications: . atorvastatin  80 mg Oral q1800  . carvedilol  50 mg Oral BID WC  . cloNIDine  0.1 mg Oral BID  . furosemide  160 mg Intravenous 4 times per day  . heparin  5,000 Units Subcutaneous 3 times per day  . hydrALAZINE  50 mg Oral 4 times per day  . insulin aspart  0-9 Units Subcutaneous TID WC  . loratadine  10 mg Oral Daily  . metolazone  10 mg Oral Daily  . pantoprazole  40 mg Oral Daily   . sodium chloride 25 mL/hr (04/15/15 0904)   sodium chloride, acetaminophen **OR** acetaminophen, albuterol, hydrALAZINE, ipratropium, ondansetron **OR** ondansetron (ZOFRAN) IV, sodium chloride flush  Exam: Obese pleasant AAF no distress No jvd or bruits Chest bibasilar crackles 1/34 up, improved RRR no MRG Abd soft ntnd no mass or ascites +bs obese GU deferred MS no joint effusions or deformity Ext 2+ bilat LE edema LE's/ no wounds or ulcers Neuro is alert, Ox 3 , nf, no asterixis   Assessment: 1. 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 Progressive disease. Persistent vol overload/ pulm edema despite max diuretics, also nauseous which is c/w uremia.  Need to proceed w dialysis, have d/w patient and questions answered. Have asked IR to place tunneled HD cath.   2. Fluid overload / pulm edema - f/u CXR 4/5 persistent CHF 3. DM longstanding 4. HTN on 3 BP medications at home 5. Hypernatremia - hypervolemic. Rx w HD 6. Access- SP LUE BC AVF 04/15/15  Plan - as above, have TDC placed and initiate dialysis. CLIP process started.    Kelly Splinter MD [facility] pager [phone]    cell [phone] 04/13/2015, 3:54 PM    Recent Labs Lab 04/13/15 0515 04/15/15 0617 04/16/15 0527  NA 148* 147*  146* 141  K 4.0 3.7  3.7 4.3  CL 112* 103  103 98*  CO2 27 30  30 30   GLUCOSE 119* 161*  161* 218*  BUN 41* 45*  45* 49*  CREATININE 4.04* 4.13*  4.15* 4.81*  CALCIUM 8.0* 8.4*  8.4* 8.2*  PHOS  --  5.2* 7.5*    Recent Labs Lab 04/11/15 2331 04/15/15 0617 04/16/15 0527  AST 30  --   --   ALT 34  --   --   ALKPHOS 94  --   --   BILITOT 0.7  --   --   PROT 6.3*  --   --   ALBUMIN 2.8* 2.3* 2.2*    Recent Labs Lab 04/12/15 0502 04/13/15 0515 04/15/15 0617  WBC 15.5* 14.7* 14.4*  HGB 10.4* 9.6* 9.7*  HCT 33.4* 32.4* 31.9*  MCV 90.5 95.0 94.4  PLT 294 295 261

## 2015-04-16 NOTE — Sedation Documentation (Signed)
Pt O2 saturation dropped to 79%, pt placed on NRB. Pt has pulse.

## 2015-04-16 NOTE — Sedation Documentation (Signed)
Patient is resting comfortably. 

## 2015-04-16 NOTE — Sedation Documentation (Signed)
Pt responsive.

## 2015-04-16 NOTE — Sedation Documentation (Signed)
Ambu bag patient.

## 2015-04-16 NOTE — Sedation Documentation (Signed)
Report given to Tyrone Nine, RN

## 2015-04-16 NOTE — Procedures (Signed)
Post-Procedure Note  Pre-operative Diagnosis: ESRD        Post-operative Diagnosis: ESRD    Indications:  ESRD and needs dialysis  Procedure Details: Informed consent was obtained.  Timeout performed.  Sedation meds given (1 mg Versed and 50 mcg Fentanyl).  Right IJ access obtained without complication.  At this point, patient's oxygen saturations decreased and patient became unresponsive.  Heart rate decreased into 40s and a Code was called.  Patient was ventilated with bag and given reversal agents.  By the time the code team arrived, the patient was responsive and coughing up secretions.  Patient's heart rate and oxygen saturations returned to normal.  Patient's vitals were stable and patient was now completely responsive.  Patient is scheduled for dialysis tonight.  As a result, the patient was prepped again and proceeded with HD catheter placement without additional sedation.  The right IJ access was dislodged during the bag ventilation.  Access was obtained again from right IJ with US guidance.  A 23 cm tip to cuff Palindrome catheter was placed, tip at SVC/RA junction.     Findings: Catheter tip at SVC/RA junction.  Complications: Transient episode of bradycardia and unresponsiveness due to sedation.  Patient's airway probably closed during sedation due to her morbid obesity and short neck.       Condition: Stable  Plan: Plan for hemodialysis tonight.

## 2015-04-16 NOTE — Sedation Documentation (Signed)
Patient is resting comfortably. Vitals stable. 

## 2015-04-16 NOTE — Progress Notes (Signed)
Physical Therapy Treatment Patient Details Name: Rachel Chaney MRN: [id number] DOB: 11-Jul-1960 Today's Date: 04/16/2015    History of Present Illness 55 year old female with a past medical history significant for diabetes mellitus type 2, chronic kidney disease stage III, and hypertension and admitted for acute diastolic heart failure and hypoglycemia    PT Comments    Limited due to low saturations and nausea today. MD recommended limited mobility due to pulmonary edema. Required min guard assist for transfers and tolerated light seated exercises well. Spo2 was 79% on room air when PT entered room, required 4L to bring back into 90s. RN notified, aware. Patient will continue to benefit from skilled physical therapy services to further improve independence with functional mobility.   Follow Up Recommendations  No PT follow up     Equipment Recommendations  Rolling walker with 5" wheels    Recommendations for Other Services       Precautions / Restrictions Precautions Precautions: Fall Precaution Comments: monitor sats Restrictions Weight Bearing Restrictions: No    Mobility  Bed Mobility               General bed mobility comments: sitting EOB  Transfers Overall transfer level: Needs assistance Equipment used: Rolling walker (2 wheeled) Transfers: Sit to/from [facility] Sit to Stand: Min guard Stand pivot transfers: Min guard       General transfer comment: Close guard for safety. VC for hand placement. Heavy anterior lean for momentum to rise. Using RW for pivot to recliner, minimal to no dyspnea. Cues for technique and safety.  Ambulation/Gait             General Gait Details: Omitted today due to sats and MD recommendations to limit activity due to pulmonary edema.   Stairs            Wheelchair Mobility    Modified Rankin (Stroke Patients Only)       Balance Overall balance assessment: Needs  assistance Sitting-balance support: No upper extremity supported;Feet supported Sitting balance-Leahy Scale: Good     Standing balance support: Single extremity supported Standing balance-Leahy Scale: Poor                      Cognition Arousal/Alertness: Awake/alert Behavior During Therapy: WFL for tasks assessed/performed Overall Cognitive Status: Within Functional Limits for tasks assessed                      Exercises General Exercises - Lower Extremity Ankle Circles/Pumps: AROM;Both;10 reps;Seated Long Arc Quad: Strengthening;Both;10 reps;Seated Hip Flexion/Marching: Strengthening;Both;10 reps;Seated    General Comments General comments (skin integrity, edema, etc.): SpO2 79% on room air when PT entered room, 2L only improved to 81%, up to 98% on 4L. RN notified and aware.      Pertinent Vitals/Pain Pain Assessment: No/denies pain    Home Living                      Prior Function            PT Goals (current goals can now be found in the care plan section) Acute Rehab PT Goals PT Goal Formulation: With patient Time For Goal Achievement: 04/19/15 Potential to Achieve Goals: Good Progress towards PT goals: Not progressing toward goals - comment (limited today)    Frequency  Min 3X/week    PT Plan Current plan remains appropriate    Co-evaluation  End of Session Equipment Utilized During Treatment: Oxygen Activity Tolerance: Treatment limited secondary to medical complications (Comment) (low sats, pulm edema) Patient left: in chair;with call bell/phone within reach;with nursing/sitter in room     Time: [id number] PT Time Calculation (min) (ACUTE ONLY): 11 min  Charges:  $Therapeutic Activity: 8-22 mins                    G Codes:      Ellouise Newer 2015/05/03, 10:24 AM  Elayne Snare, [place]

## 2015-04-16 NOTE — Sedation Documentation (Signed)
Code blue called. Pt has pulse

## 2015-04-16 NOTE — Progress Notes (Addendum)
[facility]  Subjective  - Minimal pain in left upper arm.   Objective 149/61 73 97.6 F (36.4 C) (Oral) 12 95%  Intake/Output Summary (Last 24 hours) at 04/16/15 0947 Last data filed at 04/16/15 [id number]  Gross per 24 hour  Intake   1736 ml  Output    351 ml  Net   1385 ml    Palpable thrill in left AV fistula at anastomosis site Incision C/D/I N/V/M intact left UE  Assessment/Planning: POD #1 Left BC av fistula  F/U with Dr. Kellie Simmering in 6 weeks for duplex ultrasound of fistula No stick for at least 12 weeks Dr. Jolyn Nap, [facility] 04/16/2015 9:47 AM --  Laboratory Lab Results:  Recent Labs  04/15/15 0617  WBC 14.4*  HGB 9.7*  HCT 31.9*  PLT 261   BMET  Recent Labs  04/15/15 0617 04/16/15 0527  NA 147*  146* 141  K 3.7  3.7 4.3  CL 103  103 98*  CO2 30  30 30   GLUCOSE 161*  161* 218*  BUN 45*  45* 49*  CREATININE 4.13*  4.15* 4.81*  CALCIUM 8.4*  8.4* 8.2*    COAG Lab Results  Component Value Date   INR 1.02 04/15/2015   INR 0.98 04/11/2015   No results found for: PTT  Agree with above Excellent pulse and palpable thrill in fistula We'll see in the office in 6 weeks

## 2015-04-16 NOTE — Sedation Documentation (Signed)
Pt arousable. Vomiting. Pt pulse 54

## 2015-04-16 NOTE — Consult Note (Signed)
Chief Complaint: new onset ESRD  Referring Physician:Dr. Roney Jaffe  Supervising Physician: Markus Daft  HPI: Rachel Chaney is an 55 y.o. female who was admitted with acute CHF.  She also has a known history of CKD.  During the admission, she had a AV fistula placed in preparation for HD.  In the interim a request has been made for a permcath.    Past Medical History:  Past Medical History  Diagnosis Date  . Diabetes mellitus   . Hyperlipidemia   . Hypertension     Past Surgical History:  Past Surgical History  Procedure Laterality Date  . Eye surgery Right     retinal detachment    Family History:  Family History  Problem Relation Age of Onset  . Heart disease Mother   . Heart disease Father     Social History:  reports that she has never smoked. She has never used smokeless tobacco. She reports that she does not drink alcohol. Her drug history is not on file.  Allergies: No Known Allergies  Medications:   Medication List    ASK your doctor about these medications        acetaminophen 500 MG tablet  Commonly known as:  TYLENOL  Take 1,000 mg by mouth every 6 (six) hours as needed for pain.     atorvastatin 80 MG tablet  Commonly known as:  LIPITOR  Take 1 tablet (80 mg total) by mouth daily.     calcitRIOL 0.25 MCG capsule  Commonly known as:  ROCALTROL  TAKE 1 CAPSULE (0.25 MCG TOTAL) BY MOUTH DAILY.     carvedilol 25 MG tablet  Commonly known as:  COREG  Take 2 tablets (50 mg total) by mouth 2 (two) times daily with a meal.     cetirizine 10 MG tablet  Commonly known as:  ZYRTEC  Take 10 mg by mouth daily as needed for allergies.     cloNIDine 0.1 MG tablet  Commonly known as:  CATAPRES  Take 1 tablet (0.1 mg total) by mouth 2 (two) times daily.     diclofenac sodium 1 % Gel  Commonly known as:  VOLTAREN  Apply 2 g topically 4 (four) times daily.     furosemide 80 MG tablet  Commonly known as:  LASIX  Take 2 tablets (160 mg total)  by mouth 2 (two) times daily.     glucose blood test strip  Commonly known as:  ONE TOUCH ULTRA TEST  Use as instructed     hydrALAZINE 50 MG tablet  Commonly known as:  APRESOLINE  Take 1 tablet (50 mg total) by mouth every 6 (six) hours.     Insulin Pen Needle 29G X 12MM Misc  Inject 1 pen into the skin 2 (two) times daily. Check blood sugar daily.     Insulin Pen Needle 31G X 5 MM Misc  Use as directed for insulin injection bid     LANTUS SOLOSTAR 100 UNIT/ML Solostar Pen  Generic drug:  Insulin Glargine  INJECT 50 UNITS IN THE MORNING AND 30 UNITS IN THE EVENING     omeprazole 20 MG capsule  Commonly known as:  PRILOSEC  Take 1 capsule (20 mg total) by mouth daily as needed (acid reflux).     ONETOUCH DELICA LANCETS 56Y Misc  1 Device by Does not apply route as needed (to check blood glucose).     Syringe (Disposable) 1 ML Misc  Use as directed  Please HPI for pertinent positives, otherwise complete 10 system ROS negative.  Mallampati Score: MD Evaluation Airway: WNL Heart: WNL Abdomen: WNL Chest/ Lungs: WNL ASA  Classification: 3 Mallampati/Airway Score: Two  Physical Exam: BP 149/61 mmHg  Pulse 73  Temp(Src) 97.6 F (36.4 C) (Oral)  Resp 12  Ht _0  (1.626 m)  Wt 256 lb 9.9 oz (116.4 kg)  BMI 44.03 kg/m2  SpO2 95%  LMP 09/18/2011 Body mass index is 44.03 kg/(m^2). General: pleasant, morbidly obese black female who is in NAD HEENT: head is normocephalic, atraumatic.  Sclera are noninjected.  PERRL.  Ears and nose without any masses or lesions.  Mouth is pink and moist Heart: regular, rate, and rhythm.  Normal s1,s2. No obvious murmurs, gallops, or rubs noted.  Palpable radial and pedal pulses bilaterally Lungs: CTAB, no wheezes, rhonchi, or rales noted.  Respiratory effort nonlabored Abd: soft, NT, ND, +BS, no masses, hernias, morbidly obese Psych: A&Ox3 with an appropriate affect.   Labs: Results for orders placed or performed during the  hospital encounter of 04/11/15 (from the past 48 hour(s))  Glucose, capillary     Status: Abnormal   Collection Time: 04/14/15  5:30 PM  Result Value Ref Range   Glucose-Capillary 213 (H) 65 - 99 mg/dL   Comment 1 Notify RN    Comment 2 Document in Chart   Glucose, capillary     Status: Abnormal   Collection Time: 04/14/15  8:38 PM  Result Value Ref Range   Glucose-Capillary 146 (H) 65 - 99 mg/dL  Surgical pcr screen     Status: None   Collection Time: 04/15/15 12:24 AM  Result Value Ref Range   MRSA, PCR NEGATIVE NEGATIVE   Staphylococcus aureus NEGATIVE NEGATIVE    Comment:        The Xpert SA Assay (FDA approved for NASAL specimens in patients over 22 years of age), is one component of a comprehensive surveillance program.  Test performance has been validated by [facility] for patients greater than or equal to 55 year old. It is not intended to diagnose infection nor to guide or monitor treatment.   Glucose, capillary     Status: Abnormal   Collection Time: 04/15/15  5:33 AM  Result Value Ref Range   Glucose-Capillary 151 (H) 65 - 99 mg/dL  Renal function panel     Status: Abnormal   Collection Time: 04/15/15  6:17 AM  Result Value Ref Range   Sodium 147 (H) 135 - 145 mmol/L   Potassium 3.7 3.5 - 5.1 mmol/L   Chloride 103 101 - 111 mmol/L   CO2 30 22 - 32 mmol/L   Glucose, Bld 161 (H) 65 - 99 mg/dL   BUN 45 (H) 6 - 20 mg/dL   Creatinine, Ser 4.13 (H) 0.44 - 1.00 mg/dL   Calcium 8.4 (L) 8.9 - 10.3 mg/dL   Phosphorus 5.2 (H) 2.5 - 4.6 mg/dL   Albumin 2.3 (L) 3.5 - 5.0 g/dL   GFR calc non Af Amer 11 (L) >60 mL/min   GFR calc Af Amer 13 (L) >60 mL/min    Comment: (NOTE) The eGFR has been calculated using the CKD EPI equation. This calculation has not been validated in all clinical situations. eGFR's persistently <60 mL/min signify possible Chronic Kidney Disease.    Anion gap 14 5 - 15  Protime-INR     Status: None   Collection Time: 04/15/15  6:17 AM  Result  Value Ref Range   Prothrombin Time  13.6 11.6 - 15.2 seconds   INR 1.02 0.00 - 7.09  Basic metabolic panel     Status: Abnormal   Collection Time: 04/15/15  6:17 AM  Result Value Ref Range   Sodium 146 (H) 135 - 145 mmol/L   Potassium 3.7 3.5 - 5.1 mmol/L   Chloride 103 101 - 111 mmol/L   CO2 30 22 - 32 mmol/L   Glucose, Bld 161 (H) 65 - 99 mg/dL   BUN 45 (H) 6 - 20 mg/dL   Creatinine, Ser 4.15 (H) 0.44 - 1.00 mg/dL   Calcium 8.4 (L) 8.9 - 10.3 mg/dL   GFR calc non Af Amer 11 (L) >60 mL/min   GFR calc Af Amer 13 (L) >60 mL/min    Comment: (NOTE) The eGFR has been calculated using the CKD EPI equation. This calculation has not been validated in all clinical situations. eGFR's persistently <60 mL/min signify possible Chronic Kidney Disease.    Anion gap 13 5 - 15  CBC     Status: Abnormal   Collection Time: 04/15/15  6:17 AM  Result Value Ref Range   WBC 14.4 (H) 4.0 - 10.5 K/uL   RBC 3.38 (L) 3.87 - 5.11 MIL/uL   Hemoglobin 9.7 (L) 12.0 - 15.0 g/dL   HCT 31.9 (L) 36.0 - 46.0 %   MCV 94.4 78.0 - 100.0 fL   MCH 28.7 26.0 - 34.0 pg   MCHC 30.4 30.0 - 36.0 g/dL   RDW 16.3 (H) 11.5 - 15.5 %   Platelets 261 150 - 400 K/uL  Glucose, capillary     Status: Abnormal   Collection Time: 04/15/15  7:59 AM  Result Value Ref Range   Glucose-Capillary 156 (H) 65 - 99 mg/dL  Glucose, capillary     Status: Abnormal   Collection Time: 04/15/15 11:52 AM  Result Value Ref Range   Glucose-Capillary 191 (H) 65 - 99 mg/dL   Comment 1 Notify RN   Glucose, capillary     Status: Abnormal   Collection Time: 04/15/15 12:40 PM  Result Value Ref Range   Glucose-Capillary 199 (H) 65 - 99 mg/dL  Glucose, capillary     Status: Abnormal   Collection Time: 04/15/15  5:21 PM  Result Value Ref Range   Glucose-Capillary 219 (H) 65 - 99 mg/dL  Glucose, capillary     Status: Abnormal   Collection Time: 04/15/15  9:11 PM  Result Value Ref Range   Glucose-Capillary 184 (H) 65 - 99 mg/dL  Renal function  panel     Status: Abnormal   Collection Time: 04/16/15  5:27 AM  Result Value Ref Range   Sodium 141 135 - 145 mmol/L   Potassium 4.3 3.5 - 5.1 mmol/L   Chloride 98 (L) 101 - 111 mmol/L   CO2 30 22 - 32 mmol/L   Glucose, Bld 218 (H) 65 - 99 mg/dL   BUN 49 (H) 6 - 20 mg/dL   Creatinine, Ser 4.81 (H) 0.44 - 1.00 mg/dL   Calcium 8.2 (L) 8.9 - 10.3 mg/dL   Phosphorus 7.5 (H) 2.5 - 4.6 mg/dL   Albumin 2.2 (L) 3.5 - 5.0 g/dL   GFR calc non Af Amer 9 (L) >60 mL/min   GFR calc Af Amer 11 (L) >60 mL/min    Comment: (NOTE) The eGFR has been calculated using the CKD EPI equation. This calculation has not been validated in all clinical situations. eGFR's persistently <60 mL/min signify possible Chronic Kidney Disease.    Anion  gap 13 5 - 15  Glucose, capillary     Status: Abnormal   Collection Time: 04/16/15  7:57 AM  Result Value Ref Range   Glucose-Capillary 196 (H) 65 - 99 mg/dL  Glucose, capillary     Status: Abnormal   Collection Time: 04/16/15 11:30 AM  Result Value Ref Range   Glucose-Capillary 291 (H) 65 - 99 mg/dL    Imaging: Dg Chest 2 View  04/15/2015  CLINICAL DATA:  Shortness of breath. EXAM: CHEST  2 VIEW COMPARISON:  04/11/2015 FINDINGS: Low volumes with indistinct and streaky opacities at the bases. Chronic cardiomegaly. Interstitial coarsening which may reflect edema. IMPRESSION: 1. Low volume chest with bibasilar atelectasis or pneumonia. 2. Possible superimposed CHF. Electronically Signed   By: Monte Fantasia M.D.   On: 04/15/2015 08:19    Assessment/Plan 1. CKD, need perm cath -we will plan to place a permcath today -she ate at 0900, but has been NPO and received heparin at 0600, but none since then.  Her labs otherwise are unremarkable. Risks and Benefits discussed with the patient including, but not limited to bleeding, infection, pneumothorax, or fibrin sheath development and need for additional procedures. All of the patient's questions were answered, patient is  agreeable to proceed. Consent signed and in chart.    Thank you for this interesting consult.  I greatly enjoyed meeting Darothy I Phillis and look forward to participating in their care.  A copy of this report was sent to the requesting provider on this date.  Electronically Signed: Henreitta Cea 04/16/2015, 11:58 AM   I spent a total of 40 Minutes   in face to face in clinical consultation, greater than 50% of which was counseling/coordinating care for CKD, needs perm cath for HD

## 2015-04-16 NOTE — Progress Notes (Signed)
Family Medicine Teaching Service Daily Progress Note Intern Pager: [phone]  Patient name: Rachel Chaney Medical record number: [id number] Date of birth: 03/27/1960 Age: 55 y.o. Gender: female  Primary Care Provider: Marina Goodell, MD Consultants: nephrology Code Status: FULL  Pt Overview and Major Events to Date:  4/5 - presented to [facility] ED for SOB; transferred to [facility], admitted to [facility] 4/6 - AVF placement  Assessment and Plan: Rachel Chaney is a 55 yo F presenting with worsening SOB, found to have worsening CKD. PMH includes Type II DM, CKD, HTN.  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 Cr 4.13 (4/6). L brachial-cephalic AVF placed 4/6.   - Nephrology following - appreciate recs  HFpEF: Fluid overloaded on admission to [facility]. Last echo with EF 60-65% (04/2015). Has been receiving IV Lasix and zaroxolyn for four days prior to admission to [facility]. Respiratory status continues to improve with diuresis. Weight 256.9lb today (down from 277 yesterday). UOP 763mL yesterday. Maintaining adequate O2 sats on 2L [place].   - Continue IV Lasix 160mg  TID, metolazone 10 mg qd  HTN: BP 142/64, 127/54 overnight. - Continue carvedilol, clonidine, hydralazine  Type II DM: CBG 184 overnight. - Continue Novolog meal coverage  FEN/GI: Protonix PPx: subQ heparin  Disposition: home pending medical improvement   Subjective:  No complaints this AM. Had no difficulties with fistula placement yesterday, and is anxious to go home.   Objective: Temp:  [97.4 F (36.3 C)-98.6 F (37 C)] 97.6 F (36.4 C) (04/07 0802) Pulse Rate:  [67-78] 73 (04/07 0802) Resp:  [12-19] 12 (04/07 0802) BP: (127-174)/(54-96) 149/61 mmHg (04/07 0802) SpO2:  [94 %-98 %] 95 % (04/07 0802) Weight:  [256 lb 9.9 oz (116.4 kg)] 256 lb 9.9 oz (116.4 kg) (04/07 0157) Physical Exam: General: sitting up in bed in NAD, eating breakfast Cardiovascular: distant heart sounds, RRR, no murmurs appreciated Respiratory: Scattered rhonchi,  comfortable work of breathing on 2L  Abdomen: Soft, non-distended, non-tender, +BS Extremities: 1+ pitting edema to knees bilaterally Neuro: A&Ox3, no focal deficits Psych: appropriate mood and affect  Laboratory:  Recent Labs Lab 04/12/15 0502 04/13/15 0515 04/15/15 0617  WBC 15.5* 14.7* 14.4*  HGB 10.4* 9.6* 9.7*  HCT 33.4* 32.4* 31.9*  PLT 294 295 261    Recent Labs Lab 04/11/15 2331  04/13/15 0515 04/15/15 0617 04/16/15 0527  NA 147*  < > 148* 147*  146* 141  K 4.0  < > 4.0 3.7  3.7 4.3  CL 114*  < > 112* 103  103 98*  CO2 25  < > 27 30  30 30   BUN 39*  < > 41* 45*  45* 49*  CREATININE 3.80*  < > 4.04* 4.13*  4.15* 4.81*  CALCIUM 8.3*  < > 8.0* 8.4*  8.4* 8.2*  PROT 6.3*  --   --   --   --   BILITOT 0.7  --   --   --   --   ALKPHOS 94  --   --   --   --   ALT 34  --   --   --   --   AST 30  --   --   --   --   GLUCOSE 55*  < > 119* 161*  161* 218*  < > = values in this interval not displayed. Imaging/Diagnostic Tests: Dg Chest 2 View 04/11/2015  CLINICAL DATA:  Shortness of breath beginning at 10 p.m. tonight. History of hypertension, hyperlipidemia and diabetes. EXAM: CHEST  2 VIEW. IMPRESSION: Similar cardiomegaly, interstitial and alveolar airspace opacities compatible with pulmonary edema.   US Renal 04/12/2015  CLINICAL DATA:  55 year old female with acute renal failure. EXAM: RENAL / URINARY TRACT ULTRASOUND COMPLETE  IMPRESSION: Upper limits of normal echogenicity of both kidneys which may represent medical renal disease. No evidence of hydronephrosis or solid mass.   Verner Mould, MD 04/16/2015, 9:33 AM PGY-1, [place] Intern pager: [phone], text pages welcome

## 2015-04-16 NOTE — Sedation Documentation (Signed)
Assumed care of pt

## 2015-04-17 LAB — CBC
HCT: 31.8 % — ABNORMAL LOW (ref 36.0–46.0)
Hemoglobin: 9.2 g/dL — ABNORMAL LOW (ref 12.0–15.0)
MCH: 27.4 pg (ref 26.0–34.0)
MCHC: 28.9 g/dL — ABNORMAL LOW (ref 30.0–36.0)
MCV: 94.6 fL (ref 78.0–100.0)
Platelets: 228 10*3/uL (ref 150–400)
RBC: 3.36 MIL/uL — ABNORMAL LOW (ref 3.87–5.11)
RDW: 15.6 % — ABNORMAL HIGH (ref 11.5–15.5)
WBC: 14.6 10*3/uL — ABNORMAL HIGH (ref 4.0–10.5)

## 2015-04-17 LAB — RENAL FUNCTION PANEL
Albumin: 2.3 g/dL — ABNORMAL LOW (ref 3.5–5.0)
Anion gap: 11 (ref 5–15)
BUN: 34 mg/dL — ABNORMAL HIGH (ref 6–20)
CO2: 30 mmol/L (ref 22–32)
Calcium: 8.2 mg/dL — ABNORMAL LOW (ref 8.9–10.3)
Chloride: 100 mmol/L — ABNORMAL LOW (ref 101–111)
Creatinine, Ser: 3.86 mg/dL — ABNORMAL HIGH (ref 0.44–1.00)
GFR calc Af Amer: 14 mL/min — ABNORMAL LOW (ref >60)
GFR calc non Af Amer: 12 mL/min — ABNORMAL LOW (ref >60)
Glucose, Bld: 174 mg/dL — ABNORMAL HIGH (ref 65–99)
Phosphorus: 5.7 mg/dL — ABNORMAL HIGH (ref 2.5–4.6)
Potassium: 4 mmol/L (ref 3.5–5.1)
Sodium: 141 mmol/L (ref 135–145)

## 2015-04-17 LAB — GLUCOSE, CAPILLARY
Glucose-Capillary: 158 mg/dL — ABNORMAL HIGH (ref 65–99)
Glucose-Capillary: 246 mg/dL — ABNORMAL HIGH (ref 65–99)
Glucose-Capillary: 150 mg/dL — ABNORMAL HIGH (ref 65–99)
Glucose-Capillary: 175 mg/dL — ABNORMAL HIGH (ref 65–99)

## 2015-04-17 MED ORDER — LIDOCAINE-PRILOCAINE 2.5-2.5 % EX CREA: 1.0000 "application " | TOPICAL_CREAM | CUTANEOUS | Status: DC | PRN

## 2015-04-17 MED ORDER — PENTAFLUOROPROP-TETRAFLUOROETH EX AERO: 1.0000 "application " | INHALATION_SPRAY | CUTANEOUS | Status: DC | PRN

## 2015-04-17 MED ORDER — SODIUM CHLORIDE 0.9 % IV SOLN: 100.0000 mL | INTRAVENOUS | Status: DC | PRN

## 2015-04-17 MED ORDER — HEPARIN SODIUM (PORCINE) 1000 UNIT/ML DIALYSIS: 1000.0000 [IU] | INTRAMUSCULAR | Status: DC | PRN

## 2015-04-17 MED ORDER — ALTEPLASE 2 MG IJ SOLR: 2.0000 mg | Freq: Once | INTRAMUSCULAR | Status: DC | PRN

## 2015-04-17 MED ORDER — LIDOCAINE HCL (PF) 1 % IJ SOLN
5.0000 mL | INTRAMUSCULAR | Status: DC | PRN
Start: 2015-04-17 — End: 2015-04-18

## 2015-04-17 MED ORDER — CARVEDILOL 12.5 MG PO TABS
12.5000 mg | ORAL_TABLET | Freq: Two times a day (BID) | ORAL | Status: DC
  Administered 2015-04-17 – 2015-04-18 (×2): 12.5 mg via ORAL
  Filled 2015-04-17 (×2): qty 1

## 2015-04-17 MED ORDER — HYDRALAZINE HCL 25 MG PO TABS
25.0000 mg | ORAL_TABLET | Freq: Four times a day (QID) | ORAL | Status: DC
  Administered 2015-04-17 – 2015-04-18 (×3): 25 mg via ORAL
  Filled 2015-04-17 (×3): qty 1

## 2015-04-17 NOTE — Progress Notes (Signed)
[facility] Progress Note   Subjective: nauseous. Husband here, says she has been struggling w severe fatigue at home and drowsiness as well the last few months.  Had first HD yest , 2nd HD planned for today.   Filed Vitals:   04/16/15 2100 04/16/15 2207 04/17/15 0446 04/17/15 0915  BP: 99/54 139/59 159/70 126/49  Pulse: 70 72 87 79  Temp:  97.5 F (36.4 C) 99.5 F (37.5 C) 98.6 F (37 C)  TempSrc:  Oral Oral Oral  Resp: 20 20 16 18   Height:      Weight: 117.5 kg (259 lb 0.7 oz)     SpO2:  97% 97% 96%    Inpatient medications: . atorvastatin  80 mg Oral q1800  . carvedilol  12.5 mg Oral BID WC  . heparin  5,000 Units Subcutaneous 3 times per day  . hydrALAZINE  25 mg Oral 4 times per day  . insulin aspart  0-9 Units Subcutaneous TID WC  . loratadine  10 mg Oral Daily  . pantoprazole  40 mg Oral Daily   . sodium chloride 25 mL/hr (04/15/15 0904)   sodium chloride, acetaminophen **OR** acetaminophen, albuterol, hydrALAZINE, ipratropium, ondansetron **OR** ondansetron (ZOFRAN) IV, sodium chloride flush  Exam: Obese pleasant AAF no distress No jvd or bruits Chest bibasilar crackles, o/w clear RRR no MRG Abd soft ntnd no mass or ascites +bs obese GU deferred MS no joint effusions or deformity Ext 1-2+ bilat LE edema LE's/ no wounds or ulcers Neuro is alert, Ox 3 , nf, no asterixis   Assessment: 1. 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 Progressive disease w fatigue/ somnolence/ nausea all c/w uremia.  Unable to diurese effectively as well for pulm edema.  TDC placed and for 2nd HD today.  Lower BP meds to help with vol removal.  CLIP process initiated.  Has AVF placed the other day.  Appreciate IR and VVS assist.  2. Vol excess - improving 3. Pulm edema - down to 1L [place] 4. DM longstanding 5. HTN on 3 BP medications at home 6. Hypernatremia - hypervolemic. Resolving w HD.  7. Access- SP LUE BC AVF 04/15/15  Plan - HD #2 today, get vol down further.  Decreased BP meds.    Kelly Splinter MD [facility] pager [phone]    cell [phone] 04/13/2015, 3:54 PM    Recent Labs Lab 04/15/15 0617 04/16/15 0527 04/17/15 0613  NA 147*  146* 141 141  K 3.7  3.7 4.3 4.0  CL 103  103 98* 100*  CO2 30  30 30 30   GLUCOSE 161*  161* 218* 174*  BUN 45*  45* 49* 34*  CREATININE 4.13*  4.15* 4.81* 3.86*  CALCIUM 8.4*  8.4* 8.2* 8.2*  PHOS 5.2* 7.5* 5.7*    Recent Labs Lab 04/11/15 2331 04/15/15 0617 04/16/15 0527 04/17/15 0613  AST 30  --   --   --   ALT 34  --   --   --   ALKPHOS 94  --   --   --   BILITOT 0.7  --   --   --   PROT 6.3*  --   --   --   ALBUMIN 2.8* 2.3* 2.2* 2.3*    Recent Labs Lab 04/12/15 0502 04/13/15 0515 04/15/15 0617  WBC 15.5* 14.7* 14.4*  HGB 10.4* 9.6* 9.7*  HCT 33.4* 32.4* 31.9*  MCV 90.5 95.0 94.4  PLT 294 295 261

## 2015-04-17 NOTE — Progress Notes (Signed)
Patient & husband given the patient education network guide with the list of videos to watch concerning kidney failure and hemodialysis. Husband verbalized understanding on how to access videos via telephone.

## 2015-04-17 NOTE — Discharge Summary (Signed)
[facility] Discharge Summary  Patient name: Rachel Chaney Medical record number: [id number] Date of birth: January 14, 1960 Age: 55 y.o. Gender: female Date of Admission: 04/11/2015  Date of Discharge: 04/19/15 Admitting Physician: Norval Morton, MD  Primary Care Provider: Marina Goodell, MD Consultants: Nephrology, radiology  Indication for Hospitalization: dyspnea, vascular access  Discharge Diagnoses/Problem List:  Patient Active Problem List   Diagnosis Date Noted  . Acute on chronic congestive heart failure ([place])   . CKD stage 4 due to type 2 diabetes mellitus ([place])   . Acute diastolic (congestive) heart failure ([place]) 04/12/2015  . Hypoglycemia 04/12/2015  . Acute renal failure superimposed on stage 4 chronic kidney disease ([place]) 04/12/2015  . Health care maintenance 02/18/2015  . Hypoxia   . CRI (chronic renal insufficiency)   . Dyspnea   . Hypertensive emergency   . Edema 12/14/2014  . Hypernatremia   . Hypoalbuminemia   . Uncontrolled hypertension   . Peripheral edema 12/13/2014  . Contusion of buttock 05/21/2014  . Healthcare maintenance 03/02/2014  . Chronic kidney disease due to diabetes mellitus ([place]) 10/13/2013  . Leukocytosis 05/17/2013  . Anemia in chronic illness 05/17/2013  . Lower extremity edema 02/19/2013  . Numbness of feet 03/11/2012  . Abnormal appearance of cervix 08/08/2011  . Back pain 08/07/2011  . GERD (gastroesophageal reflux disease) 04/27/2010  . Diabetes mellitus with neuropathy ([place]) 04/02/2006  . HYPERLIPIDEMIA 03/08/2006  . OBESITY, NOS 03/08/2006  . Essential hypertension 03/08/2006   Disposition: home  Discharge Condition: stable  Discharge Exam:  General: sitting in bedside chair in NAD Cardiovascular: distant heart sounds, RRR, no murmurs appreciated Respiratory: Few coarse breath sounds, comfortable work of breathing on room air.  Abdomen: Soft, non-distended, non-tender, +BS Extremities: 1+  pitting edema to knees bilaterally  Neuro: A&Ox3, no focal deficits Psych: appropriate mood and affect  Brief Hospital Course:  Patient was admitted for AFV placement for worsening CKD. She was subsequently started on HD.   Stage IV/V CKD Patient initially presented to [facility] for worsening dyspnea. She was found to have significant fluid overload 2/2 acute on chronic HFpEF, and was started on aggressive diuresis. She was also found to have worsening kidney disease thought to be secondary to diabetic nephropathy. As such, she was transferred to [facility] for vascular access. Patient had AVF created two days after arrival to [facility]. Her volume status and kidney function did not improve, so nephrology felt it necessary to begin hemodialysis. Patient had permacath placed (as AVF needed time to mature), and began HD. She received three treatments before discharge. When her volume and respiratory status improved, she was deemed stable for discharge home with continued outpatient hemodialysis.   Issues for Follow Up:  1. Medication changes made during admission: 1. Discontinued Lasix given initiation of HD.  2. Decreased hydralazine to 10mg  TID. 3. Started Phoslo and RenaVit.  2. Patient to continue dialysis at [facility].  3. Please ensure patient schedule an outpatient appointment with nephro.  4. Patient with symptoms of OSA during admission. Recommend scheduling outpatient sleep study.   Significant Procedures: AVF creation (4/6); permacath placement (4/7)  Significant Labs and Imaging:   Recent Labs Lab 04/13/15 0515 04/15/15 0617 04/17/15 1431  WBC 14.7* 14.4* 14.6*  HGB 9.6* 9.7* 9.2*  HCT 32.4* 31.9* 31.8*  PLT 295 261 228    Recent Labs Lab 04/13/15 0515 04/15/15 0617 04/16/15 0527 04/17/15 0613  NA 148* 147*  146* 141 141  K 4.0 3.7  3.7 4.3 4.0  CL 112* 103  103 98* 100*  CO2 27 30  30 30 30   GLUCOSE 119* 161*  161* 218* 174*  BUN 41* 45*  45* 49* 34*  CREATININE  4.04* 4.13*  4.15* 4.81* 3.86*  CALCIUM 8.0* 8.4*  8.4* 8.2* 8.2*  PHOS  --  5.2* 7.5* 5.7*  ALBUMIN  --  2.3* 2.2* 2.3*    Results/Tests Pending at Time of Discharge: none  Discharge Medications:    Medication List    STOP taking these medications        furosemide 80 MG tablet  Commonly known as:  LASIX      TAKE these medications        acetaminophen 500 MG tablet  Commonly known as:  TYLENOL  Take 1,000 mg by mouth every 6 (six) hours as needed for pain.     atorvastatin 80 MG tablet  Commonly known as:  LIPITOR  Take 1 tablet (80 mg total) by mouth daily.     calcitRIOL 0.25 MCG capsule  Commonly known as:  ROCALTROL  TAKE 1 CAPSULE (0.25 MCG TOTAL) BY MOUTH DAILY.     calcium acetate 667 MG capsule  Commonly known as:  PHOSLO  Take 1 capsule (667 mg total) by mouth 3 (three) times daily with meals.     carvedilol 25 MG tablet  Commonly known as:  COREG  Take 2 tablets (50 mg total) by mouth 2 (two) times daily with a meal.     cetirizine 10 MG tablet  Commonly known as:  ZYRTEC  Take 10 mg by mouth daily as needed for allergies.     cloNIDine 0.1 MG tablet  Commonly known as:  CATAPRES  Take 1 tablet (0.1 mg total) by mouth 2 (two) times daily.     Darbepoetin Alfa 25 MCG/0.42ML Sosy injection  Commonly known as:  ARANESP  Inject 0.42 mLs (25 mcg total) into the vein every Monday with hemodialysis.     diclofenac sodium 1 % Gel  Commonly known as:  VOLTAREN  Apply 2 g topically 4 (four) times daily.     glucose blood test strip  Commonly known as:  ONE TOUCH ULTRA TEST  Use as instructed     hydrALAZINE 10 MG tablet  Commonly known as:  APRESOLINE  Take 1 tablet (10 mg total) by mouth 3 (three) times daily.     Insulin Pen Needle 29G X 12MM Misc  Inject 1 pen into the skin 2 (two) times daily. Check blood sugar daily.     Insulin Pen Needle 31G X 5 MM Misc  Use as directed for insulin injection bid     LANTUS SOLOSTAR 100 UNIT/ML Solostar  Pen  Generic drug:  Insulin Glargine  INJECT 50 UNITS IN THE MORNING AND 30 UNITS IN THE EVENING     multivitamin Tabs tablet  Take 1 tablet by mouth at bedtime.     omeprazole 20 MG capsule  Commonly known as:  PRILOSEC  Take 1 capsule (20 mg total) by mouth daily as needed (acid reflux).     ONETOUCH DELICA LANCETS 99991111 Misc  1 Device by Does not apply route as needed (to check blood glucose).     Syringe (Disposable) 1 ML Misc  Use as directed        Discharge Instructions: Please refer to Patient Instructions section of EMR for full details.  Patient was counseled important signs and symptoms that should prompt return to medical  care, changes in medications, dietary instructions, activity restrictions, and follow up appointments.   Follow-Up Appointments: Follow-up Information    Follow up with Tinnie Gens, MD In 6 weeks.   Specialties:  Vascular Surgery, Interventional Cardiology, Cardiology   Why:  Office will call you to arrange your appt (sent)   Contact information:   [place] [address] [phone]       Follow up with Marina Goodell, MD. Go on 04/26/2015.   Specialty:  Family Medicine   Why:  For hospital follow-up at 8:30 AM.   Contact information:   [place] [address] [phone]       Verner Mould, MD 04/19/2015, 3:29 PM PGY-1, [place]

## 2015-04-17 NOTE — Progress Notes (Signed)
Family Medicine Teaching Service Daily Progress Note Intern Pager: [phone]  Patient name: Rachel Chaney Medical record number: [id number] Date of birth: 04/09/1960 Age: 55 y.o. Gender: female  Primary Care Provider: Marina Goodell, MD Consultants: nephrology Code Status: FULL  Pt Overview and Major Events to Date:  4/5 - presented to [facility] ED for SOB; transferred to [facility], admitted to [facility] 4/6 - AVF placement 4/7 - permacath placed; started HD  Assessment and Plan: Rachel Chaney is a 55 yo F presenting with worsening SOB, found to have worsening CKD. PMH includes Type II DM, CKD, HTN.  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 Cr 3.86 today (4/8). L brachial-cephalic AVF placed 4/6. Permacath placed 4/7 and HD started.  - Nephrology following - appreciate recs  HFpEF: Fluid overloaded on admission to [facility]. Last echo with EF 60-65% (04/2015). Has been receiving IV Lasix and zaroxolyn for four days prior to admission to [facility]. Respiratory status continues to improve with diuresis. Inadequate urine output yesterday (total output +1.2L) Maintaining adequate O2 sats in high 90s on 2L [place] with no respiratory complaints. Improved lung exam today.    - D/c Lasix and metolazone given hemodialysis - Wean O2 as tolerated - Continue to monitor fluid status   HTN: BP 159/70, 126/49 overnight. - Continue carvedilol, clonidine, hydralazine  Type II DM: CBG 158, 195 overnight. - Continue Novolog meal coverage  FEN/GI: Protonix PPx: subQ heparin  Disposition: home pending medical improvement   Subjective:  Patient had Permacath placed yesterday to begin HD. After catheter placement, she became bradycardic and unresponsive. Code blue was called and patient required manual bagging, however she maintained a pulse throughout the episode and quickly improved. Did not require medications or intubations during code. Episode likely transient bradycardia 2/2 to anesthesia used for cath placement.  Patient  received first dialysis treatment last night. As such her diuretics have been discontinued.  No complaints this AM. Feels respiratory status and edema is improving.   Objective: Temp:  [97.5 F (36.4 C)-99.5 F (37.5 C)] 98.6 F (37 C) (04/08 0915) Pulse Rate:  [49-87] 79 (04/08 0915) Resp:  [10-21] 18 (04/08 0915) BP: (99-214)/(42-154) 126/49 mmHg (04/08 0915) SpO2:  [85 %-100 %] 96 % (04/08 0915) Weight:  [259 lb 0.7 oz (117.5 kg)-259 lb 7.7 oz (117.7 kg)] 259 lb 0.7 oz (117.5 kg) (04/07 2100) Physical Exam: General: sitting up in bed in NAD; husband at bedside Cardiovascular: distant heart sounds, RRR, no murmurs appreciated Respiratory: Few coarse breath sounds, comfortable work of breathing on 2L [place] Abdomen: Soft, non-distended, non-tender, +BS Extremities: mild to 1+ pitting edema to knees bilaterally Neuro: A&Ox3, no focal deficits Psych: appropriate mood and affect  Laboratory:  Recent Labs Lab 04/12/15 0502 04/13/15 0515 04/15/15 0617  WBC 15.5* 14.7* 14.4*  HGB 10.4* 9.6* 9.7*  HCT 33.4* 32.4* 31.9*  PLT 294 295 261    Recent Labs Lab 04/11/15 2331  04/15/15 0617 04/16/15 0527 04/17/15 0613  NA 147*  < > 147*  146* 141 141  K 4.0  < > 3.7  3.7 4.3 4.0  CL 114*  < > 103  103 98* 100*  CO2 25  < > 30  30 30 30   BUN 39*  < > 45*  45* 49* 34*  CREATININE 3.80*  < > 4.13*  4.15* 4.81* 3.86*  CALCIUM 8.3*  < > 8.4*  8.4* 8.2* 8.2*  PROT 6.3*  --   --   --   --   BILITOT  0.7  --   --   --   --   ALKPHOS 94  --   --   --   --   ALT 34  --   --   --   --   AST 30  --   --   --   --   GLUCOSE 55*  < > 161*  161* 218* 174*  < > = values in this interval not displayed. Imaging/Diagnostic Tests: Dg Chest 2 View 04/11/2015  CLINICAL DATA:  Shortness of breath beginning at 10 p.m. tonight. History of hypertension, hyperlipidemia and diabetes. EXAM: CHEST  2 VIEW. IMPRESSION: Similar cardiomegaly, interstitial and alveolar airspace opacities compatible with  pulmonary edema.   US Renal 04/12/2015  CLINICAL DATA:  55 year old female with acute renal failure. EXAM: RENAL / URINARY TRACT ULTRASOUND COMPLETE  IMPRESSION: Upper limits of normal echogenicity of both kidneys which may represent medical renal disease. No evidence of hydronephrosis or solid mass.   Rachel Mould, MD 04/17/2015, 9:29 AM PGY-1, [place] Intern pager: [phone], text pages welcome

## 2015-04-18 LAB — HEPATITIS B SURFACE ANTIBODY,QUALITATIVE: Hep B S Ab: NONREACTIVE

## 2015-04-18 LAB — GLUCOSE, CAPILLARY
Glucose-Capillary: 188 mg/dL — ABNORMAL HIGH (ref 65–99)
Glucose-Capillary: 230 mg/dL — ABNORMAL HIGH (ref 65–99)
Glucose-Capillary: 203 mg/dL — ABNORMAL HIGH (ref 65–99)
Glucose-Capillary: 148 mg/dL — ABNORMAL HIGH (ref 65–99)

## 2015-04-18 LAB — HEPATITIS B CORE ANTIBODY, TOTAL: Hep B Core Total Ab: NEGATIVE

## 2015-04-18 MED ORDER — SIMETHICONE 80 MG PO CHEW
80.0000 mg | CHEWABLE_TABLET | Freq: Four times a day (QID) | ORAL | Status: DC | PRN
  Administered 2015-04-19: 80 mg via ORAL
  Filled 2015-04-18 (×2): qty 1

## 2015-04-18 MED ORDER — HYDRALAZINE HCL 10 MG PO TABS
10.0000 mg | ORAL_TABLET | Freq: Three times a day (TID) | ORAL | Status: DC
  Administered 2015-04-18 – 2015-04-19 (×3): 10 mg via ORAL
  Filled 2015-04-18 (×3): qty 1

## 2015-04-18 MED ORDER — INSULIN ASPART 100 UNIT/ML ~~LOC~~ SOLN
0.0000 [IU] | Freq: Three times a day (TID) | SUBCUTANEOUS | Status: DC
  Administered 2015-04-18 – 2015-04-19 (×3): 5 [IU] via SUBCUTANEOUS
  Administered 2015-04-19: 3 [IU] via SUBCUTANEOUS

## 2015-04-18 MED ORDER — INSULIN ASPART 100 UNIT/ML ~~LOC~~ SOLN
3.0000 [IU] | Freq: Three times a day (TID) | SUBCUTANEOUS | Status: DC
  Administered 2015-04-18 – 2015-04-19 (×4): 3 [IU] via SUBCUTANEOUS

## 2015-04-18 NOTE — Progress Notes (Signed)
Pt with complaints of sour stomach and requesting Mylanta. Page to [facility] at [phone] for notification. Dorthey Sawyer, RN

## 2015-04-18 NOTE — Progress Notes (Signed)
  [facility] Progress Note   Subjective: patient feeling stronger , more energy. Off oxygen.   Filed Vitals:   04/17/15 2107 04/17/15 2111 04/18/15 0521 04/18/15 0827  BP: 129/51  161/68 127/57  Pulse: 83  87 79  Temp: 100.3 F (37.9 C)  99.1 F (37.3 C) 98.9 F (37.2 C)  TempSrc: Oral  Oral Oral  Resp: 18  18 17   Height:      Weight:  115.2 kg (253 lb 15.5 oz)    SpO2: 100%  93% 95%    Inpatient medications: . atorvastatin  80 mg Oral q1800  . heparin  5,000 Units Subcutaneous 3 times per day  . hydrALAZINE  10 mg Oral 3 times per day  . insulin aspart  0-15 Units Subcutaneous TID WC  . insulin aspart  3 Units Subcutaneous TID WC  . loratadine  10 mg Oral Daily  . pantoprazole  40 mg Oral Daily   . sodium chloride 25 mL/hr (04/15/15 0904)   sodium chloride, sodium chloride, sodium chloride, acetaminophen **OR** acetaminophen, albuterol, alteplase, heparin, ipratropium, lidocaine (PF), lidocaine-prilocaine, ondansetron **OR** ondansetron (ZOFRAN) IV, pentafluoroprop-tetrafluoroeth, simethicone, sodium chloride flush  Exam: Obese pleasant AAF no distress No jvd or bruits Chest clear bilat  RRR no MRG Abd soft ntnd no mass or ascites +bs obese Ext edema better, 1+ now Neuro is alert, Ox 3 , nf, no asterixis   Assessment: 1. CKD stage V due to diab nephropathy - new start to dialysis. Has AVF and TDC placed this past week.  CLIP process initiated.  3rd HD tomorrow. Vol better and uremia improving.  2. Vol excess/ pulm edema - improved 3. DM longstanding 4. HTN - lowered BP meds to allow for UF 5. Access- SP LUE BC AVF 04/15/15  Plan -  HD Monday, CLIP pending.    Kelly Splinter MD [facility] pager [phone]    cell [phone] 04/13/2015, 3:54 PM    Recent Labs Lab 04/15/15 0617 04/16/15 0527 04/17/15 0613  NA 147*  146* 141 141  K 3.7  3.7 4.3 4.0  CL 103  103 98* 100*  CO2 30  30 30 30   GLUCOSE 161*  161* 218* 174*  BUN  45*  45* 49* 34*  CREATININE 4.13*  4.15* 4.81* 3.86*  CALCIUM 8.4*  8.4* 8.2* 8.2*  PHOS 5.2* 7.5* 5.7*    Recent Labs Lab 04/11/15 2331 04/15/15 0617 04/16/15 0527 04/17/15 0613  AST 30  --   --   --   ALT 34  --   --   --   ALKPHOS 94  --   --   --   BILITOT 0.7  --   --   --   PROT 6.3*  --   --   --   ALBUMIN 2.8* 2.3* 2.2* 2.3*    Recent Labs Lab 04/13/15 0515 04/15/15 0617 04/17/15 1431  WBC 14.7* 14.4* 14.6*  HGB 9.6* 9.7* 9.2*  HCT 32.4* 31.9* 31.8*  MCV 95.0 94.4 94.6  PLT 295 261 228

## 2015-04-18 NOTE — Progress Notes (Signed)
Family Medicine Teaching Service Daily Progress Note Intern Pager: [phone]  Patient name: Rachel Chaney Medical record number: [id number] Date of birth: [birth year] Age: 55 y.o. Gender: female  Primary Care Provider: Marina Goodell, MD Consultants: nephrology Code Status: FULL  Pt Overview and Major Events to Date:  4/5 - presented to [facility] ED for SOB; transferred to [facility], admitted to [facility] 4/6 - AVF placement 4/7 - permacath placed; started HD  Assessment and Plan: Rachel Chaney is a 55 yo F presenting with worsening SOB, found to have volume overload in the setting of worsening CKD and HFpEF. PMH includes Type II DM, CKD, HTN.  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 Cr 3.86 today (4/8). L brachial-cephalic AVF placed 4/6. Permacath placed 4/7 and HD started.  - Nephrology following - appreciate recs - Continuing to diurese as needed.  - Volume status improving.  - on room air.  - CLIP process initiated.   HFpEF: Likely 2/2 Uncontrolled HTN, Fluid overloaded on admission to [facility]. Last echo with EF 60-65% (04/2015), G1DD and elevated LV filling pressure with dilated LA, LVH.     - D/c Lasix and metolazone given hemodialysis - Volume removal as needed with Dialysis - Wean O2 as tolerated > room air.  - Continue to monitor fluid status  - HTN control   HTN: Somewhat better. Intermittently elevated.  - Decreasing BP meds per nephro   - Hydral 10 TID for now.  - Restart as able.   Type II DM: CBG 158, 195 overnight. - Continue Novolog meal coverage  At risk for Sleep Apnea - obese, snores at night, Neck > 16 inches, Noted to stop breathing by partner.  - Needs outpatient sleep study.   Obesity - abdominal obesity, but also with very large breast size. Not helping the possibility / likelihood of the above problem.  - Would recommend referral to plastics for breast reduction evaluation.  - To be done as an outpatient.   FEN/GI: Protonix PPx: subQ heparin  Disposition:  home pending improvement.   Subjective:  Improving, says she feels much better. Had some abdominal cramping last night, but otherwise no complaints. Upon further discussion, we broached the topic of sleep apnea and symptoms related to this. She does snore at night and intermittently will wake up startled. She has day time sleepiness. She has never had a sleep study. Additionally, she has never been evaluated for breast reduction. May consider evaluation for this.   Objective: Temp:  [98.1 F (36.7 C)-100.3 F (37.9 C)] 98.9 F (37.2 C) (04/09 0827) Pulse Rate:  [73-87] 79 (04/09 0827) Resp:  [14-18] 17 (04/09 0827) BP: (76-161)/(22-74) 127/57 mmHg (04/09 0827) SpO2:  [93 %-100 %] 95 % (04/09 0827) Weight:  [253 lb 15.5 oz (115.2 kg)-257 lb 8 oz (116.8 kg)] 253 lb 15.5 oz (115.2 kg) (04/08 2111) Physical Exam: General: sitting up in bed in NAD; husband at bedside Cardiovascular: distant heart sounds, RRR, no murmurs appreciated Respiratory: Few coarse breath sounds, comfortable work of breathing on room air. No crackles.  Abdomen: Soft, non-distended, non-tender, +BS Extremities: mild to 1+ pitting edema to knees bilaterally remains.  Neuro: A&Ox3, no focal deficits Psych: appropriate mood and affect  Laboratory:  Recent Labs Lab 04/13/15 0515 04/15/15 0617 04/17/15 1431  WBC 14.7* 14.4* 14.6*  HGB 9.6* 9.7* 9.2*  HCT 32.4* 31.9* 31.8*  PLT 295 261 228    Recent Labs Lab 04/11/15 2331  04/15/15 0617 04/16/15 0527 04/17/15 0613  NA 147*  < >  147*  146* 141 141  K 4.0  < > 3.7  3.7 4.3 4.0  CL 114*  < > 103  103 98* 100*  CO2 25  < > 30  30 30 30   BUN 39*  < > 45*  45* 49* 34*  CREATININE 3.80*  < > 4.13*  4.15* 4.81* 3.86*  CALCIUM 8.3*  < > 8.4*  8.4* 8.2* 8.2*  PROT 6.3*  --   --   --   --   BILITOT 0.7  --   --   --   --   ALKPHOS 94  --   --   --   --   ALT 34  --   --   --   --   AST 30  --   --   --   --   GLUCOSE 55*  < > 161*  161* 218* 174*  < >  = values in this interval not displayed. Imaging/Diagnostic Tests: Dg Chest 2 View 04/11/2015  CLINICAL DATA:  Shortness of breath beginning at 10 p.m. tonight. History of hypertension, hyperlipidemia and diabetes. EXAM: CHEST  2 VIEW. IMPRESSION: Similar cardiomegaly, interstitial and alveolar airspace opacities compatible with pulmonary edema.   US Renal 04/12/2015  CLINICAL DATA:  55 year old female with acute renal failure. EXAM: RENAL / URINARY TRACT ULTRASOUND COMPLETE  IMPRESSION: Upper limits of normal echogenicity of both kidneys which may represent medical renal disease. No evidence of hydronephrosis or solid mass.   Rachel Hacker, MD 04/18/2015, 9:29 AM PGY-2, [facility] Intern pager: [phone], text pages welcome

## 2015-04-19 LAB — IRON AND TIBC
Iron: 32 ug/dL (ref 28–170)
Saturation Ratios: 10 % — ABNORMAL LOW (ref 10.4–31.8)
TIBC: 309 ug/dL (ref 250–450)
UIBC: 277 ug/dL

## 2015-04-19 LAB — FERRITIN: Ferritin: 172 ng/mL (ref 11–307)

## 2015-04-19 LAB — GLUCOSE, CAPILLARY
Glucose-Capillary: 172 mg/dL — ABNORMAL HIGH (ref 65–99)
Glucose-Capillary: 221 mg/dL — ABNORMAL HIGH (ref 65–99)
Glucose-Capillary: 116 mg/dL — ABNORMAL HIGH (ref 65–99)

## 2015-04-19 MED ORDER — LIDOCAINE-PRILOCAINE 2.5-2.5 % EX CREA: 1.0000 "application " | TOPICAL_CREAM | CUTANEOUS | Status: DC | PRN

## 2015-04-19 MED ORDER — SODIUM CHLORIDE 0.9 % IV SOLN: 100.0000 mL | INTRAVENOUS | Status: DC | PRN

## 2015-04-19 MED ORDER — NEPRO/CARBSTEADY PO LIQD
237.0000 mL | Freq: Two times a day (BID) | ORAL | Status: DC
  Administered 2015-04-19: 237 mL via ORAL

## 2015-04-19 MED ORDER — LIDOCAINE HCL (PF) 1 % IJ SOLN: 5.0000 mL | INTRAMUSCULAR | Status: DC | PRN

## 2015-04-19 MED ORDER — HEPARIN SODIUM (PORCINE) 1000 UNIT/ML DIALYSIS: 2000.0000 [IU] | INTRAMUSCULAR | Status: DC | PRN

## 2015-04-19 MED ORDER — RENA-VITE PO TABS: 1.0000 | ORAL_TABLET | Freq: Every day | ORAL | Status: DC

## 2015-04-19 MED ORDER — CALCIUM ACETATE (PHOS BINDER) 667 MG PO CAPS: 667.0000 mg | ORAL_CAPSULE | Freq: Three times a day (TID) | ORAL | Status: DC

## 2015-04-19 MED ORDER — ALTEPLASE 2 MG IJ SOLR: 2.0000 mg | Freq: Once | INTRAMUSCULAR | Status: DC | PRN

## 2015-04-19 MED ORDER — CALCIUM ACETATE (PHOS BINDER) 667 MG PO CAPS
667.0000 mg | ORAL_CAPSULE | Freq: Three times a day (TID) | ORAL | Status: DC
  Administered 2015-04-19: 667 mg via ORAL
  Filled 2015-04-19: qty 1

## 2015-04-19 MED ORDER — HYDRALAZINE HCL 10 MG PO TABS: 10.0000 mg | ORAL_TABLET | Freq: Three times a day (TID) | ORAL | Status: DC

## 2015-04-19 MED ORDER — DARBEPOETIN ALFA 25 MCG/0.42ML IJ SOSY
25.0000 ug | PREFILLED_SYRINGE | INTRAMUSCULAR | Status: DC
  Filled 2015-04-19 (×2): qty 0.42

## 2015-04-19 MED ORDER — DARBEPOETIN ALFA 25 MCG/0.42ML IJ SOSY: 25.0000 ug | PREFILLED_SYRINGE | INTRAMUSCULAR | Status: DC

## 2015-04-19 MED ORDER — PENTAFLUOROPROP-TETRAFLUOROETH EX AERO: 1.0000 "application " | INHALATION_SPRAY | CUTANEOUS | Status: DC | PRN

## 2015-04-19 MED ORDER — DARBEPOETIN ALFA 25 MCG/0.42ML IJ SOSY
PREFILLED_SYRINGE | INTRAMUSCULAR | Status: AC
  Filled 2015-04-19: qty 0.42

## 2015-04-19 MED ORDER — HEPARIN SODIUM (PORCINE) 1000 UNIT/ML DIALYSIS: 1000.0000 [IU] | INTRAMUSCULAR | Status: DC | PRN

## 2015-04-19 NOTE — Discharge Instructions (Signed)
We have made some changes to your medications during this hospitalization.  - Your dose of hydralazine (for high blood pressure) has been decreased from 50mg  to 10mg  three times a day.  - Please begin taking Phoslo and RenaVit.  - STOP taking Lasix.   It is very important to attend your follow-up appointment with Dr. Lincoln Brigham, and also to schedule an appointment with Dr. Kellie Simmering (kidney doctor).   Your first dialysis appointment is this Wednesday (April 12) at 11:30 AM. The dialysis center is: [facility] [address], [place], [place] [address] [phone]

## 2015-04-19 NOTE — Progress Notes (Signed)
Family Medicine Teaching Service Daily Progress Note Intern Pager: [phone]  Patient name: Rachel Chaney Medical record number: [id number] Date of birth: 31-Mar-1960 Age: 55 y.o. Gender: female  Primary Care Provider: Marina Goodell, MD Consultants: nephrology Code Status: FULL  Pt Overview and Major Events to Date:  4/5 - presented to [facility] ED for SOB; transferred to [facility], admitted to [facility] 4/6 - AVF placement 4/7 - permacath placed; started HD  Assessment and Plan: Rachel Chaney is a 55 yo F presenting with worsening SOB, found to have volume overload in the setting of worsening CKD and HFpEF. PMH includes Type II DM, CKD, HTN.  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 L brachial-cephalic AVF placed 4/6. Permacath placed 4/7 and HD started.  - Nephrology following - appreciate recs - Third HD session today. Likely discharge afterwards - CLIP process initiated   HFpEF: Likely 2/2 Uncontrolled HTN, Fluid overloaded on admission to [facility]. Last echo with EF 60-65% (04/2015), G1DD and elevated LV filling pressure with dilated LA, LVH. No longer requiring supplemental O2.  - Volume removal as needed with dialysis - Continue to monitor fluid status  - HTN control   HTN: 186/73, 138/63 overnight. .  - Decreasing BP meds per nephro   - Hydral 10 TID for now  - Restart as able   Type II DM: CBG 172, 148 overnight. - Continue Novolog meal coverage  At risk for Sleep Apnea - obese, snores at night, Neck > 16 inches. Noted to stop breathing by husband.  - Needs outpatient sleep study  Obesity - abdominal obesity, but also with very large breast size. Not helping the possibility/likelihood of the above problem.  - Would recommend referral to plastics for breast reduction evaluation - To be done as an outpatient  FEN/GI: Protonix PPx: subQ heparin  Disposition: home likely today after HD  Subjective:  Patient with no complaints this AM. Reports dialysis is going well. Is comfortably  with going home today.   Objective: Temp:  [99.1 F (37.3 C)-100.3 F (37.9 C)] 99.4 F (37.4 C) (04/10 0613) Pulse Rate:  [84-86] 84 (04/10 0613) Resp:  [18] 18 (04/10 0613) BP: (138-186)/(63-73) 186/73 mmHg (04/10 0613) SpO2:  [92 %-98 %] 98 % (04/10 [id number]) Physical Exam: General: sitting in bedside chair in NAD Cardiovascular: distant heart sounds, RRR, no murmurs appreciated Respiratory: Few coarse breath sounds, comfortable work of breathing on room air.  Abdomen: Soft, non-distended, non-tender, +BS Extremities: 1+ pitting edema to knees bilaterally   Neuro: A&Ox3, no focal deficits Psych: appropriate mood and affect  Laboratory:  Recent Labs Lab 04/13/15 0515 04/15/15 0617 04/17/15 1431  WBC 14.7* 14.4* 14.6*  HGB 9.6* 9.7* 9.2*  HCT 32.4* 31.9* 31.8*  PLT 295 261 228    Recent Labs Lab 04/15/15 0617 04/16/15 0527 04/17/15 0613  NA 147*  146* 141 141  K 3.7  3.7 4.3 4.0  CL 103  103 98* 100*  CO2 30  30 30 30   BUN 45*  45* 49* 34*  CREATININE 4.13*  4.15* 4.81* 3.86*  CALCIUM 8.4*  8.4* 8.2* 8.2*  GLUCOSE 161*  161* 218* 174*   Imaging/Diagnostic Tests: Dg Chest 2 View 04/11/2015  CLINICAL DATA:  Shortness of breath beginning at 10 p.m. tonight. History of hypertension, hyperlipidemia and diabetes. EXAM: CHEST  2 VIEW. IMPRESSION: Similar cardiomegaly, interstitial and alveolar airspace opacities compatible with pulmonary edema.   US Renal 04/12/2015  CLINICAL DATA:  55 year old female with acute renal failure. EXAM: RENAL /  URINARY TRACT ULTRASOUND COMPLETE  IMPRESSION: Upper limits of normal echogenicity of both kidneys which may represent medical renal disease. No evidence of hydronephrosis or solid mass.   Verner Mould, MD 04/19/2015, 9:16 AM PGY-1, [place] Intern pager: [phone], text pages welcome

## 2015-04-19 NOTE — Care Management Note (Signed)
Case Management Note  Patient Details  Name: Rachel Chaney MRN: [id number] Date of Birth: [birth year]  Subjective/Objective:        CM following for progression and d/c planning.            Action/Plan: 04/19/2015 Met with pt re disability etc. Pt given info re where to go to apply for disability and Medicaid. The hospital is unable to assist with any of there programs for pt who have insurance. Pt states that she will drive herself to HD treatments as her center is very near her home. No other needs identified at this time.   Expected Discharge Date:  04/19/2015               Expected Discharge Plan:  Home/Self Care  In-House Referral:     Discharge planning Services  CM Consult  Post Acute Care Choice:    Choice offered to:     DME Arranged:    DME Agency:     HH Arranged:    HH Agency:     Status of Service:  Completed, signed off  Medicare Important Message Given:    Date Medicare IM Given:    Medicare IM give by:    Date Additional Medicare IM Given:    Additional Medicare Important Message give by:     If discussed at [place] of Stay Meetings, dates discussed:    Additional Comments:  Adron Bene, RN 04/19/2015, 10:28 AM

## 2015-04-19 NOTE — Procedures (Signed)
I was present at this session.  I have reviewed the session itself and made appropriate changes. HD via pc, bp ^.  ;edema, lowering vol.  Armistead Sult L 4/10/20173:28 PM

## 2015-04-19 NOTE — Progress Notes (Signed)
Discharge instructions and medications discussed with patient.  All questions answered.  

## 2015-04-19 NOTE — Progress Notes (Signed)
04/19/2015 8:29 AM Hemodialysis Outpatient Note; this patient has been accepted at the [facility] center on a Monday, Wednesday and Friday 2nd shift schedule. The center can begin treatment on Wednesday April 12 at 11:30 AM. Thank you. Gordy Savers

## 2015-04-19 NOTE — Plan of Care (Signed)
Problem: Food- and Nutrition-Related Knowledge Deficit (NB-1.1) Goal: Nutrition education Formal process to instruct or train a patient/client in a skill or to impart knowledge to help patients/clients voluntarily manage or modify food choices and eating behavior to maintain or improve health. Outcome: Completed/Met Date Met:  04/19/15 Nutrition Education Note  RD consulted for Renal Education. Provided "Healthy Eating with Kidney Disease" to patient. Reviewed food groups and provided written recommended serving sizes specifically determined for patient's current nutritional status.   Explained why diet restrictions are needed and provided lists of foods to limit/avoid that are high potassium, sodium, and phosphorus. Provided specific recommendations on safer alternatives of these foods. Strongly encouraged compliance of this diet.   Discussed importance of protein intake at each meal and snack. Provided examples of how to maximize protein intake throughout the day. Discussed need for fluid restriction with dialysis and renal-friendly beverage options. Teach back method used.  Expect good compliance.  Body mass index is 43.57 kg/(m^2). Pt meets criteria for morbid obesity based on current BMI.  Current diet order is renal/carb modified with 1200 ml fluids, patient is consuming approximately 75-100% of meals at this time. Labs and medications reviewed. Pt currently has Nepro Shake ordered and has been consuming them. RD to continue with orders. No further nutrition interventions warranted at this time. RD contact information provided. If additional nutrition issues arise, please re-consult RD.  Corrin Parker, MS, RD, LDN Pager # [phone] After hours/ weekend pager # [phone]

## 2015-04-19 NOTE — Progress Notes (Signed)
  [facility] Progress Note  Assessment/Plan: 1. 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 Anemia - hgb 9.2 - needs Fe studies; no ESA yest - start low dose- likely will need Fe Assess today 3. Secondary hyperparathyroidism - P 5.7 start phoslo 1 ac check iPTH, Assess today 4. HTN/volume - titrating volume down; on hydralazine 10 tid, off diuretics- need to continue lowering volume after d/cMuch edema, lower vol,lower bp 5. Nutrition - alb 2.3 - start ONSP at d/c; will need diet education; add vits and nepro 6. DM/morbid obesity  7. Disp - ok from renal perspective for d/c today after HD CLIPPED to [place], PA-C [place] [phone] 04/19/2015,9:13 AM  LOS: 7 days  I have seen and examined this patient and agree with the plan of care seen, eval,examined,counseled .  Yoshiaki Kreuser L 04/19/2015, 10:12 AM   Subjective:     Objective Filed Vitals:   04/18/15 0827 04/18/15 1643 04/18/15 2126 04/19/15 0613  BP: 127/57 158/72 138/63 186/73  Pulse: 79 86 84 84  Temp: 98.9 F (37.2 C) 99.1 F (37.3 C) 100.3 F (37.9 C) 99.4 F (37.4 C)  TempSrc: Oral Oral Oral Oral  Resp: 17 18 18 18   Height:      Weight:      SpO2: 95% 93% 92% 98%   Physical Exam General: NAD sitting in chair off O2 Heart: RRR Gr2/6 M Lungs: no rales Abdomen: obese soft NT Liver down 6 cm Extremities: ++ LE edema, though improving Dialysis Access: right IF TDC and left upper AVF 04/15/15  Dialysis Orders: new start -going to [facility] MWF 2nd  Additional Objective Labs: Basic Metabolic Panel:  Recent Labs Lab 04/15/15 0617 04/16/15 0527 04/17/15 0613  NA 147*  146* 141 141  K 3.7  3.7 4.3 4.0  CL 103  103 98* 100*  CO2 30  30 30 30   GLUCOSE 161*  161* 218* 174*  BUN 45*  45* 49* 34*  CREATININE 4.13*  4.15* 4.81* 3.86*  CALCIUM 8.4*  8.4* 8.2* 8.2*  PHOS 5.2* 7.5* 5.7*   Liver Function  Tests:  Recent Labs Lab 04/15/15 0617 04/16/15 0527 04/17/15 0613  ALBUMIN 2.3* 2.2* 2.3*   CBC:  Recent Labs Lab 04/13/15 0515 04/15/15 0617 04/17/15 1431  WBC 14.7* 14.4* 14.6*  HGB 9.6* 9.7* 9.2*  HCT 32.4* 31.9* 31.8*  MCV 95.0 94.4 94.6  PLT 295 261 228   CBG:  Recent Labs Lab 04/18/15 0824 04/18/15 1200 04/18/15 1640 04/18/15 2125 04/19/15 0806  GLUCAP 188* 230* 203* 148* 172*   Medications: . sodium chloride 25 mL/hr (04/15/15 0904)   . atorvastatin  80 mg Oral q1800  . heparin  5,000 Units Subcutaneous 3 times per day  . hydrALAZINE  10 mg Oral 3 times per day  . insulin aspart  0-15 Units Subcutaneous TID WC  . insulin aspart  3 Units Subcutaneous TID WC  . loratadine  10 mg Oral Daily  . pantoprazole  40 mg Oral Daily

## 2015-04-19 NOTE — Progress Notes (Signed)
Physical Therapy Treatment Patient Details Name: Rachel Chaney MRN: [id number] DOB: 1960/04/25 Today's Date: 04/19/2015    History of Present Illness 55 year old female with a past medical history significant for diabetes mellitus type 2, chronic kidney disease stage III, and hypertension and admitted for acute diastolic heart failure and hypoglycemia    PT Comments    Pt reports that she feels confident with her mobility at this time and she is hoping to be able to go home later today. Ambulation attempted without an assistive device per pt request. No loss of balance but noted instability at times with pt reaching for hall rails occasionally. Continue to recommend rw for home and supervision with mobility for safety. This was discussed with the pt and she is in agreement. She states that she will have her husband and daughter to help at home. Patient denies any questions or concerns.    Follow Up Recommendations  No PT follow up     Equipment Recommendations  Rolling walker with 5" wheels    Recommendations for Other Services       Precautions / Restrictions Precautions Precautions: Fall Precaution Comments: monitor sats Restrictions Weight Bearing Restrictions: No    Mobility  Bed Mobility               General bed mobility comments: up in chair upon arrival  Transfers Overall transfer level: Needs assistance Equipment used: None Transfers: Sit to/from Stand Sit to Stand: Supervision         General transfer comment: safe transfer performed, mild instabilty with initial stand but no loss of balance.   Ambulation/Gait Ambulation/Gait assistance: Min guard Ambulation Distance (Feet): 100 Feet Assistive device: None Gait Pattern/deviations: Step-through pattern Gait velocity: decreased   General Gait Details: Pt ambulating without an assistive device per pt request, occasionally using hall rails for additional stabitly but no loss of balance. Pt  ambulating slowly with reports of her legs feeling stiff.    Stairs Stairs:  (declined, states she doesn't have any.)          Wheelchair Mobility    Modified Rankin (Stroke Patients Only)       Balance Overall balance assessment: Needs assistance Sitting-balance support: No upper extremity supported Sitting balance-Leahy Scale: Good     Standing balance support: No upper extremity supported Standing balance-Leahy Scale: Fair Standing balance comment: ambulating without device, occasional use of hall rail for support.                     Cognition Arousal/Alertness: Awake/alert Behavior During Therapy: WFL for tasks assessed/performed Overall Cognitive Status: Within Functional Limits for tasks assessed                      Exercises      General Comments General comments (skin integrity, edema, etc.): SpO2 93% upon return to room.       Pertinent Vitals/Pain Pain Assessment: No/denies pain (legs feel tight)    Home Living                      Prior Function            PT Goals (current goals can now be found in the care plan section) Acute Rehab PT Goals Patient Stated Goal: get home PT Goal Formulation: With patient Time For Goal Achievement: 04/21/15 Potential to Achieve Goals: Good Progress towards PT goals: Progressing toward goals    Frequency  Min 3X/week  PT Plan Current plan remains appropriate    Co-evaluation             End of Session Equipment Utilized During Treatment: Gait belt Activity Tolerance: Patient tolerated treatment well Patient left: in chair;with call bell/phone within reach     Time: 1107-1119 PT Time Calculation (min) (ACUTE ONLY): 12 min  Charges:  $Gait Training: 8-22 mins                    G Codes:      Cassell Clement, PT, CSCS Pager [phone] Office [phone]  04/19/2015, 12:21 PM

## 2015-04-20 LAB — PARATHYROID HORMONE, INTACT (NO CA): PTH: 378 pg/mL — ABNORMAL HIGH (ref 15–65)

## 2015-04-21 ENCOUNTER — Telehealth: Payer: Self-pay | Admitting: Vascular Surgery

## 2015-04-21 DIAGNOSIS — N2581 Secondary hyperparathyroidism of renal origin: Secondary | ICD-10-CM | POA: Insufficient documentation

## 2015-04-21 NOTE — Telephone Encounter (Signed)
-----   Message from Mena Goes, RN sent at 04/15/2015 12:33 PM EDT ----- Regarding: schedule   ----- Message -----    From: Ulyses Amor, PA-C    Sent: 04/15/2015  11:26 AM      To: [facility] Charge Pool  F/U with Dr. Kellie Simmering in 6 weeks s/p av fistula left UE

## 2015-04-21 NOTE — Telephone Encounter (Signed)
sched appt 5/16 at 1:30. Lm on hm# to inform pt of appt.

## 2015-04-26 ENCOUNTER — Inpatient Hospital Stay: Payer: 59 | Admitting: Student

## 2015-05-19 ENCOUNTER — Other Ambulatory Visit: Payer: Self-pay | Admitting: Internal Medicine

## 2015-05-20 ENCOUNTER — Encounter: Payer: Self-pay | Admitting: Vascular Surgery

## 2015-05-24 ENCOUNTER — Telehealth: Payer: Self-pay | Admitting: Student

## 2015-05-24 NOTE — Telephone Encounter (Signed)
Patient still waiting for her refill for hydralazine.  Prescription was sent via e-script on 5/10.  Still doesn't have filled.   Send to [facility]

## 2015-05-24 NOTE — Telephone Encounter (Signed)
Patient is calling for medication refill.  Derl Barrow, RN

## 2015-05-25 ENCOUNTER — Ambulatory Visit (INDEPENDENT_AMBULATORY_CARE_PROVIDER_SITE_OTHER): Payer: Commercial Managed Care - HMO | Admitting: Vascular Surgery

## 2015-05-25 ENCOUNTER — Encounter: Payer: Self-pay | Admitting: Vascular Surgery

## 2015-05-25 ENCOUNTER — Other Ambulatory Visit: Payer: Self-pay

## 2015-05-25 VITALS — BP 168/77 | HR 86 | Temp 100.0°F | Resp 16 | Ht 64.0 in | Wt 227.0 lb

## 2015-05-25 DIAGNOSIS — N186 End stage renal disease: Secondary | ICD-10-CM | POA: Insufficient documentation

## 2015-05-25 DIAGNOSIS — Z992 Dependence on renal dialysis: Secondary | ICD-10-CM | POA: Insufficient documentation

## 2015-05-25 NOTE — Progress Notes (Signed)
Filed Vitals:   05/25/15 1355 05/25/15 1357  BP: 142/73 168/77  Pulse: 73 86  Temp: 100 F (37.8 C)   Resp: 16   Height: 5\' 4"  (1.626 m)   Weight: 227 lb (102.967 kg)   SpO2: 90%

## 2015-05-25 NOTE — Progress Notes (Signed)
Subjective:     Patient ID: Rachel Chaney, female   DOB: August 13, 1960, 55 y.o.   MRN: [id number]  HPI this 55 year old female returns for initial follow-up regarding her left brachial-cephalic AV fistula I created on 04/15/2015. Patient is currently being dialyzed through a tunneled catheter in the right IJ. She denies any pain or numbness in the left hand.   Past Medical History  Diagnosis Date  . Diabetes mellitus   . Hyperlipidemia   . Hypertension     Social History  Substance Use Topics  . Smoking status: Never Smoker   . Smokeless tobacco: Never Used  . Alcohol Use: No    Family History  Problem Relation Age of Onset  . Heart disease Mother   . Heart disease Father     No Known Allergies   Current outpatient prescriptions:  .  acetaminophen (TYLENOL) 500 MG tablet, Take 1,000 mg by mouth every 6 (six) hours as needed for pain., Disp: , Rfl:  .  atorvastatin (LIPITOR) 80 MG tablet, Take 1 tablet (80 mg total) by mouth daily., Disp: 90 tablet, Rfl: 3 .  calcitRIOL (ROCALTROL) 0.25 MCG capsule, TAKE 1 CAPSULE (0.25 MCG TOTAL) BY MOUTH DAILY., Disp: 30 capsule, Rfl: 3 .  calcium acetate (PHOSLO) 667 MG capsule, Take 1 capsule (667 mg total) by mouth 3 (three) times daily with meals., Disp: 90 capsule, Rfl: 0 .  carvedilol (COREG) 25 MG tablet, Take 2 tablets (50 mg total) by mouth 2 (two) times daily with a meal., Disp: 60 tablet, Rfl: 11 .  cetirizine (ZYRTEC) 10 MG tablet, Take 10 mg by mouth daily as needed for allergies., Disp: , Rfl:  .  cloNIDine (CATAPRES) 0.1 MG tablet, Take 1 tablet (0.1 mg total) by mouth 2 (two) times daily., Disp: 60 tablet, Rfl: 11 .  Darbepoetin Alfa (ARANESP) 25 MCG/0.42ML SOSY injection, Inject 0.42 mLs (25 mcg total) into the vein every Monday with hemodialysis., Disp: 14.28 mL, Rfl: 0 .  glucose blood (ONE TOUCH ULTRA TEST) test strip, Use as instructed, Disp: 100 each, Rfl: 5 .  hydrALAZINE (APRESOLINE) 10 MG tablet, Take 1 tablet (10 mg  total) by mouth 3 (three) times daily., Disp: 90 tablet, Rfl: 0 .  Insulin Pen Needle 29G X 12MM MISC, Inject 1 pen into the skin 2 (two) times daily. Check blood sugar daily., Disp: 100 each, Rfl: 6 .  Insulin Pen Needle 31G X 5 MM MISC, Use as directed for insulin injection bid, Disp: 200 each, Rfl: 3 .  LANTUS SOLOSTAR 100 UNIT/ML Solostar Pen, INJECT 50 UNITS IN THE MORNING AND 30 UNITS IN THE EVENING, Disp: 3 mL, Rfl: 20 .  multivitamin (RENA-VIT) TABS tablet, Take 1 tablet by mouth at bedtime., Disp: 30 tablet, Rfl: 0 .  ONETOUCH DELICA LANCETS 99991111 MISC, 1 Device by Does not apply route as needed (to check blood glucose)., Disp: 100 each, Rfl: 3 .  Syringe, Disposable, 1 ML MISC, Use as directed, Disp: 100 each, Rfl: prn .  diclofenac sodium (VOLTAREN) 1 % GEL, Apply 2 g topically 4 (four) times daily. (Patient not taking: Reported on 02/18/2015), Disp: 100 g, Rfl: 1 .  omeprazole (PRILOSEC) 20 MG capsule, Take 1 capsule (20 mg total) by mouth daily as needed (acid reflux). (Patient not taking: Reported on 02/18/2015), Disp: 90 capsule, Rfl: 0 .  [DISCONTINUED] progesterone (PROMETRIUM) 200 MG capsule, Take 1 capsule (200 mg total) by mouth daily. Take for 10 days if bleeding occurs for more than 4  days., Disp: 10 capsule, Rfl: 1  Filed Vitals:   05/25/15 1355 05/25/15 1357  BP: 142/73 168/77  Pulse: 73 86  Temp: 100 F (37.8 C)   Resp: 16   Height: 5\' 4"  (1.626 m)   Weight: 227 lb (102.967 kg)   SpO2: 90%     Body mass index is 38.95 kg/(m^2).           Review of Systems Denies chest pain, dyspnea on exertion, PND, orthopnea, hemoptysis     Objective:   Physical Exam BP 168/77 mmHg  Pulse 86  Temp(Src) 100 F (37.8 C)  Resp 16  Ht 5\' 4"  (1.626 m)  Wt 227 lb (102.967 kg)  BMI 38.95 kg/m2  SpO2 90%  LMP 09/18/2011    Gen.-alert and oriented x3 in no apparent distress HEENT normal for age Lungs no rhonchi or wheezing Cardiovascular regular rhythm no murmurs  carotid pulses 3+ palpable no bruits audible Abdomen soft nontender no palpable masses -obese Musculoskeletal free of  major deformities Skin clear -no rashes Neurologic normal Lower extremities 3+ femoral and dorsalis pedis pulses palpable bilaterally with no edema   left upper extremity with well-healed incision in antecubital area. Excellent pulse and thrill and upper arm brachial-cephalic AV fistula. 2+ radial pulse palpable distally with well-perfused left hand. Vein is somewhat deep in the upper arm for access.       Assessment:      end-stage renal disease with nicely functioning left brachial-cehalic AV fistula-needs elevation     Plan:      plan elevation of left brachial-cephalic AV fistula on Thursday, May 25 as outpatient Patient will then be able to have this fistula utilized in [place] July and can have tunneled catheter removed

## 2015-06-01 ENCOUNTER — Encounter (HOSPITAL_COMMUNITY): Payer: Self-pay | Admitting: *Deleted

## 2015-06-01 NOTE — Progress Notes (Signed)
Pt denies SOB, chest pain, and being under the care of a cardiologist. Pt denies having a cardiac cath. Pt made aware to stop otc vitamins, fish oil, herbal medications and NSAID's. Pt stated that her fasting blood glucose usually ranges between 120-130. Pt stated that MD advised her to take half of her Lantus the night prior to surgery and none DOS. Pt made aware of diabetes protocol to check BS every 2 hours prior to arrival, interventions for a BS <70 and >220 and the phone # to SS. Pt verbalized understanding of all pre-op instructions. Pt chart forwarded to anesthesia for review of history and recent admission.

## 2015-06-02 NOTE — Progress Notes (Signed)
Anesthesia Chart Review: SAME DAY WORK-UP.  Patient is a 55 year old female scheduled for left brachiocephalic AVF superficialization on 06/03/15 by Dr. Kellie Simmering. She is s/p left brachiocephalic AVF creation on 0000000 during hospitalization for acute on chronic diastolic CHF in the setting of worsening CKD. She was started on hemodialysis that admission.  History includes ESRD (HD MWF), non-smoker, DM on insulin, diastolic CHF. Last BMI is consistent with obesity. PCP is Dr. Delane Ginger with [facility].  Meds include Coreg, catapres, Aranesp, hydralazine, Lantus.  04/12/15 Echo: Study Conclusions - Left ventricle: The cavity size was normal. Wall thickness was  increased in a pattern of moderate LVH. Systolic function was  normal. The estimated ejection fraction was in the range of 60%  to 65%. Wall motion was normal; there were no regional wall  motion abnormalities. Doppler parameters are consistent with  abnormal left ventricular relaxation (grade 1 diastolic  dysfunction). The E/e&' ratio is >15, suggesting elevated LV  filling pressure. - Left atrium: The atrium was moderately dilated at 40 ml/m2. - Tricuspid valve: There was mild regurgitation. - Pulmonary arteries: PA peak pressure: 39 mm Hg (S). - Inferior vena cava: The vessel was dilated. The respirophasic  diameter changes were blunted (< 50%), consistent with elevated  central venous pressure. Impressions: - LVEF 60-65%, moderate LVH, normal wall motion, diastolic  dysfunction, elevated LV filling pressue, moderate LAE, mild TR,  RVSP 39 mmHg, dilated IVC.  04/11/15 EKG (during admission for acute renal failure): SR, low voltage precordial leads, prolonged QT (QT 501/QTc 544 ms). No significant change since last tracing. Previous QTc 452ms on 10/26/14, 552ms on 12/13/14.  04/15/15 CXR: IMPRESSION: 1. Low volume chest with bibasilar atelectasis or pneumonia. 2. Possible superimposed CHF.  She is for labs on arrival.  She has had prolonged QT on her last EKG (done prior to her AVF creation) as well as on prior tracings. Defer decision for repeat tracing to her surgeon and/or anesthesiologist. She is now on hemodialysis.  Further evaluation on the day of surgery.  [facility] Short Stay Center/Anesthesiology Phone [phone] 06/02/2015 12:06 PM

## 2015-06-03 ENCOUNTER — Ambulatory Visit (HOSPITAL_COMMUNITY): Payer: Commercial Managed Care - HMO | Admitting: Vascular Surgery

## 2015-06-03 ENCOUNTER — Encounter (HOSPITAL_COMMUNITY)
Admission: RE | Disposition: A | Discharge status: Home / Self Care | Payer: Self-pay | Source: Ambulatory Visit | Attending: Vascular Surgery

## 2015-06-03 ENCOUNTER — Ambulatory Visit (HOSPITAL_COMMUNITY)
Admission: RE | Admit: 2015-06-03 | Discharge: 2015-06-03 | Disposition: A | Discharge status: Home / Self Care | Payer: Commercial Managed Care - HMO | Source: Ambulatory Visit | Attending: Vascular Surgery | Admitting: Vascular Surgery

## 2015-06-03 ENCOUNTER — Encounter (HOSPITAL_COMMUNITY): Payer: Self-pay | Admitting: General Practice

## 2015-06-03 DIAGNOSIS — T82898A Other specified complication of vascular prosthetic devices, implants and grafts, initial encounter: Secondary | ICD-10-CM | POA: Diagnosis not present

## 2015-06-03 DIAGNOSIS — Z794 Long term (current) use of insulin: Secondary | ICD-10-CM | POA: Insufficient documentation

## 2015-06-03 DIAGNOSIS — Z992 Dependence on renal dialysis: Secondary | ICD-10-CM | POA: Diagnosis not present

## 2015-06-03 DIAGNOSIS — Z6838 Body mass index (BMI) 38.0-38.9, adult: Secondary | ICD-10-CM | POA: Diagnosis not present

## 2015-06-03 DIAGNOSIS — E669 Obesity, unspecified: Secondary | ICD-10-CM | POA: Diagnosis not present

## 2015-06-03 DIAGNOSIS — Q278 Other specified congenital malformations of peripheral vascular system: Secondary | ICD-10-CM | POA: Insufficient documentation

## 2015-06-03 DIAGNOSIS — I132 Hypertensive heart and chronic kidney disease with heart failure and with stage 5 chronic kidney disease, or end stage renal disease: Secondary | ICD-10-CM | POA: Insufficient documentation

## 2015-06-03 DIAGNOSIS — E785 Hyperlipidemia, unspecified: Secondary | ICD-10-CM | POA: Insufficient documentation

## 2015-06-03 DIAGNOSIS — I5032 Chronic diastolic (congestive) heart failure: Secondary | ICD-10-CM | POA: Insufficient documentation

## 2015-06-03 DIAGNOSIS — N186 End stage renal disease: Secondary | ICD-10-CM | POA: Diagnosis present

## 2015-06-03 DIAGNOSIS — Z79899 Other long term (current) drug therapy: Secondary | ICD-10-CM | POA: Insufficient documentation

## 2015-06-03 DIAGNOSIS — E1022 Type 1 diabetes mellitus with diabetic chronic kidney disease: Secondary | ICD-10-CM | POA: Diagnosis not present

## 2015-06-03 HISTORY — DX: Heart failure, unspecified: I50.9

## 2015-06-03 HISTORY — PX: FISTULA SUPERFICIALIZATION: SHX6341

## 2015-06-03 LAB — GLUCOSE, CAPILLARY
Glucose-Capillary: 129 mg/dL — ABNORMAL HIGH (ref 65–99)
Glucose-Capillary: 114 mg/dL — ABNORMAL HIGH (ref 65–99)

## 2015-06-03 LAB — POCT I-STAT 4, (NA,K, GLUC, HGB,HCT)
Glucose, Bld: 143 mg/dL — ABNORMAL HIGH (ref 65–99)
HCT: 44 % (ref 36.0–46.0)
Hemoglobin: 15 g/dL (ref 12.0–15.0)
Potassium: 3.7 mmol/L (ref 3.5–5.1)
Sodium: 141 mmol/L (ref 135–145)

## 2015-06-03 SURGERY — FISTULA SUPERFICIALIZATION
Anesthesia: Monitor Anesthesia Care | Laterality: Left

## 2015-06-03 MED ORDER — PROPOFOL 500 MG/50ML IV EMUL
INTRAVENOUS | Status: DC | PRN
  Administered 2015-06-03: 75 ug/kg/min via INTRAVENOUS

## 2015-06-03 MED ORDER — EPHEDRINE SULFATE 50 MG/ML IJ SOLN
INTRAMUSCULAR | Status: DC | PRN
  Administered 2015-06-03: 15 mg via INTRAVENOUS
  Administered 2015-06-03: 10 mg via INTRAVENOUS

## 2015-06-03 MED ORDER — FENTANYL CITRATE (PF) 250 MCG/5ML IJ SOLN
INTRAMUSCULAR | Status: AC
  Filled 2015-06-03: qty 5

## 2015-06-03 MED ORDER — MIDAZOLAM HCL 2 MG/2ML IJ SOLN
INTRAMUSCULAR | Status: AC
  Filled 2015-06-03: qty 2

## 2015-06-03 MED ORDER — 0.9 % SODIUM CHLORIDE (POUR BTL) OPTIME
TOPICAL | Status: DC | PRN
  Administered 2015-06-03: 1000 mL

## 2015-06-03 MED ORDER — ONDANSETRON HCL 4 MG/2ML IJ SOLN
INTRAMUSCULAR | Status: DC | PRN
  Administered 2015-06-03: 4 mg via INTRAVENOUS

## 2015-06-03 MED ORDER — LIDOCAINE 2% (20 MG/ML) 5 ML SYRINGE
INTRAMUSCULAR | Status: AC
  Filled 2015-06-03: qty 5

## 2015-06-03 MED ORDER — LIDOCAINE-EPINEPHRINE (PF) 1 %-1:200000 IJ SOLN
INTRAMUSCULAR | Status: AC
  Filled 2015-06-03: qty 60

## 2015-06-03 MED ORDER — DEXTROSE 5 % IV SOLN
1.5000 g | INTRAVENOUS | Status: AC
  Administered 2015-06-03: 1.5 g via INTRAVENOUS

## 2015-06-03 MED ORDER — HYDROMORPHONE HCL 1 MG/ML IJ SOLN: 0.2500 mg | INTRAMUSCULAR | Status: DC | PRN

## 2015-06-03 MED ORDER — SODIUM CHLORIDE 0.9 % IV SOLN
INTRAVENOUS | Status: DC | PRN
Start: 2015-06-03 — End: 2015-06-03
  Administered 2015-06-03 (×2): via INTRAVENOUS

## 2015-06-03 MED ORDER — SODIUM CHLORIDE 0.9 % IV SOLN
10.0000 mg | INTRAVENOUS | Status: DC | PRN
  Administered 2015-06-03: 25 ug/min via INTRAVENOUS

## 2015-06-03 MED ORDER — CHLORHEXIDINE GLUCONATE CLOTH 2 % EX PADS: 6.0000 | MEDICATED_PAD | Freq: Once | CUTANEOUS | Status: DC

## 2015-06-03 MED ORDER — LIDOCAINE HCL (PF) 1 % IJ SOLN
INTRAMUSCULAR | Status: AC
  Filled 2015-06-03: qty 30

## 2015-06-03 MED ORDER — ROCURONIUM BROMIDE 50 MG/5ML IV SOLN
INTRAVENOUS | Status: AC
  Filled 2015-06-03: qty 1

## 2015-06-03 MED ORDER — SODIUM CHLORIDE 0.9 % IV SOLN
INTRAVENOUS | Status: DC | PRN
  Administered 2015-06-03: 08:00:00

## 2015-06-03 MED ORDER — MIDAZOLAM HCL 5 MG/5ML IJ SOLN
INTRAMUSCULAR | Status: DC | PRN
  Administered 2015-06-03: 2 mg via INTRAVENOUS

## 2015-06-03 MED ORDER — ACETAMINOPHEN 325 MG PO TABS
650.0000 mg | ORAL_TABLET | Freq: Once | ORAL | Status: AC
  Administered 2015-06-03: 650 mg via ORAL

## 2015-06-03 MED ORDER — HEPARIN SODIUM (PORCINE) 1000 UNIT/ML IJ SOLN
INTRAMUSCULAR | Status: AC
  Filled 2015-06-03: qty 1

## 2015-06-03 MED ORDER — SODIUM CHLORIDE 0.9 % IV SOLN: INTRAVENOUS | Status: DC

## 2015-06-03 MED ORDER — LIDOCAINE-EPINEPHRINE (PF) 1 %-1:200000 IJ SOLN
INTRAMUSCULAR | Status: DC | PRN
  Administered 2015-06-03: 10 mL

## 2015-06-03 MED ORDER — PROPOFOL 10 MG/ML IV BOLUS
INTRAVENOUS | Status: AC
  Filled 2015-06-03: qty 20

## 2015-06-03 MED ORDER — PHENYLEPHRINE HCL 10 MG/ML IJ SOLN
INTRAMUSCULAR | Status: DC | PRN
  Administered 2015-06-03: 120 ug via INTRAVENOUS
  Administered 2015-06-03: 80 ug via INTRAVENOUS

## 2015-06-03 MED ORDER — PROPOFOL 10 MG/ML IV BOLUS
INTRAVENOUS | Status: DC | PRN
  Administered 2015-06-03: 150 mg via INTRAVENOUS

## 2015-06-03 MED ORDER — HYDROCODONE-ACETAMINOPHEN 7.5-325 MG PO TABS: 1.0000 | ORAL_TABLET | Freq: Once | ORAL | Status: DC | PRN

## 2015-06-03 MED ORDER — CEFUROXIME SODIUM 1.5 G IJ SOLR
INTRAMUSCULAR | Status: AC
  Filled 2015-06-03: qty 1.5

## 2015-06-03 MED ORDER — PROTAMINE SULFATE 10 MG/ML IV SOLN
INTRAVENOUS | Status: AC
  Filled 2015-06-03: qty 5

## 2015-06-03 MED ORDER — FENTANYL CITRATE (PF) 100 MCG/2ML IJ SOLN
INTRAMUSCULAR | Status: DC | PRN
  Administered 2015-06-03: 50 ug via INTRAVENOUS

## 2015-06-03 MED ORDER — HYDRALAZINE HCL 50 MG PO TABS: ORAL_TABLET | ORAL | Status: DC

## 2015-06-03 MED ORDER — ACETAMINOPHEN 325 MG PO TABS
ORAL_TABLET | ORAL | Status: AC
  Filled 2015-06-03: qty 2

## 2015-06-03 MED ORDER — PHENYLEPHRINE 40 MCG/ML (10ML) SYRINGE FOR IV PUSH (FOR BLOOD PRESSURE SUPPORT)
PREFILLED_SYRINGE | INTRAVENOUS | Status: AC
  Filled 2015-06-03: qty 10

## 2015-06-03 MED ORDER — EPHEDRINE 5 MG/ML INJ
INTRAVENOUS | Status: AC
  Filled 2015-06-03: qty 10

## 2015-06-03 MED ORDER — ONDANSETRON HCL 4 MG/2ML IJ SOLN
INTRAMUSCULAR | Status: AC
  Filled 2015-06-03: qty 4

## 2015-06-03 MED ORDER — PHENYLEPHRINE HCL 10 MG/ML IJ SOLN
INTRAMUSCULAR | Status: AC
  Filled 2015-06-03: qty 1

## 2015-06-03 MED ORDER — OXYCODONE-ACETAMINOPHEN 5-325 MG PO TABS: 1.0000 | ORAL_TABLET | Freq: Four times a day (QID) | ORAL | Status: DC | PRN

## 2015-06-03 SURGICAL SUPPLY — 33 items
CANISTER SUCTION 2500CC (MISCELLANEOUS) ×3 IMPLANT
CLIP TI MEDIUM 6 (CLIP) ×3 IMPLANT
CLIP TI WIDE RED SMALL 6 (CLIP) ×3 IMPLANT
COVER PROBE W GEL 5X96 (DRAPES) IMPLANT
ELECT REM PT RETURN 9FT ADLT (ELECTROSURGICAL) ×3
ELECTRODE REM PT RTRN 9FT ADLT (ELECTROSURGICAL) ×1 IMPLANT
GAUZE SPONGE 4X4 12PLY STRL (GAUZE/BANDAGES/DRESSINGS) ×3 IMPLANT
GEL ULTRASOUND 20GR AQUASONIC (MISCELLANEOUS) IMPLANT
GLOVE BIO SURGEON STRL SZ 6.5 (GLOVE) ×4 IMPLANT
GLOVE BIO SURGEONS STRL SZ 6.5 (GLOVE) ×2
GLOVE BIOGEL PI IND STRL 6.5 (GLOVE) ×1 IMPLANT
GLOVE BIOGEL PI IND STRL 7.0 (GLOVE) ×1 IMPLANT
GLOVE BIOGEL PI INDICATOR 6.5 (GLOVE) ×2
GLOVE BIOGEL PI INDICATOR 7.0 (GLOVE) ×2
GLOVE SS BIOGEL STRL SZ 7 (GLOVE) ×2 IMPLANT
GLOVE SUPERSENSE BIOGEL SZ 7 (GLOVE) ×4
GLOVE SURG SS PI 6.5 STRL IVOR (GLOVE) ×3 IMPLANT
GLOVE SURG SS PI 7.0 STRL IVOR (GLOVE) ×3 IMPLANT
GOWN STRL REUS W/ TWL LRG LVL3 (GOWN DISPOSABLE) ×3 IMPLANT
GOWN STRL REUS W/TWL LRG LVL3 (GOWN DISPOSABLE) ×6
KIT BASIN OR (CUSTOM PROCEDURE TRAY) ×3 IMPLANT
KIT ROOM TURNOVER OR (KITS) ×3 IMPLANT
LIQUID BAND (GAUZE/BANDAGES/DRESSINGS) ×3 IMPLANT
NS IRRIG 1000ML POUR BTL (IV SOLUTION) ×3 IMPLANT
PACK CV ACCESS (CUSTOM PROCEDURE TRAY) ×3 IMPLANT
PAD ARMBOARD 7.5X6 YLW CONV (MISCELLANEOUS) ×6 IMPLANT
SUT PROLENE 6 0 BV (SUTURE) ×3 IMPLANT
SUT PROLENE 6 0 C 1 24 (SUTURE) ×3 IMPLANT
SUT VIC AB 3-0 SH 18 (SUTURE) ×3 IMPLANT
SUT VIC AB 3-0 SH 27 (SUTURE) ×6
SUT VIC AB 3-0 SH 27X BRD (SUTURE) ×3 IMPLANT
UNDERPAD 30X30 INCONTINENT (UNDERPADS AND DIAPERS) ×3 IMPLANT
WATER STERILE IRR 1000ML POUR (IV SOLUTION) ×3 IMPLANT

## 2015-06-03 NOTE — Anesthesia Preprocedure Evaluation (Addendum)
Anesthesia Evaluation  Patient identified by MRN, date of birth, ID band Patient awake    Reviewed: Allergy & Precautions, H&P , NPO status , Patient's Chart, lab work & pertinent test results, reviewed documented beta blocker date and time   Airway Mallampati: II  TM Distance: >3 FB Neck ROM: full    Dental  (+) Edentulous Upper, Edentulous Lower, Upper Dentures, Lower Dentures, Dental Advidsory Given   Pulmonary shortness of breath and with exertion,    Pulmonary exam normal        Cardiovascular hypertension, On Medications, Pt. on medications and Pt. on home beta blockers +CHF   Rhythm:regular Rate:Normal     Neuro/Psych    GI/Hepatic GERD  Medicated and Controlled,  Endo/Other  diabetes, Type 1, Insulin Dependent  Renal/GU ESRF and DialysisRenal disease     Musculoskeletal   Abdominal   Peds  Hematology  (+) anemia ,   Anesthesia Other Findings   Reproductive/Obstetrics                           Lab Results  Component Value Date   WBC 14.6* 04/17/2015   HGB 15.0 06/03/2015   HCT 44.0 06/03/2015   MCV 94.6 04/17/2015   PLT 228 04/17/2015   Lab Results  Component Value Date   CREATININE 3.86* 04/17/2015   BUN 34* 04/17/2015   NA 141 06/03/2015   K 3.7 06/03/2015   CL 100* 04/17/2015   CO2 30 04/17/2015    Anesthesia Physical Anesthesia Plan  ASA: III  Anesthesia Plan: MAC   Post-op Pain Management:    Induction: Intravenous  Airway Management Planned: Natural Airway and Simple Face Mask  Additional Equipment:   Intra-op Plan:   Post-operative Plan:   Informed Consent: I have reviewed the patients History and Physical, chart, labs and discussed the procedure including the risks, benefits and alternatives for the proposed anesthesia with the patient or authorized representative who has indicated his/her understanding and acceptance.   Dental Advisory  Given  Plan Discussed with: CRNA  Anesthesia Plan Comments:       Anesthesia Quick Evaluation

## 2015-06-03 NOTE — Interval H&P Note (Signed)
History and Physical Interval Note:  06/03/2015 7:33 AM  Rachel Chaney  has presented today for surgery, with the diagnosis of End Stage Renal Disease N18.6  The various methods of treatment have been discussed with the patient and family. After consideration of risks, benefits and other options for treatment, the patient has consented to  Procedure(s): BRACHIOCEPHALIC FISTULA SUPERFICIALIZATION (Left) as a surgical intervention .  The patient's history has been reviewed, patient examined, no change in status, stable for surgery.  I have reviewed the patient's chart and labs.  Questions were answered to the patient's satisfaction.     Tinnie Gens

## 2015-06-03 NOTE — Op Note (Signed)
OPERATIVE REPORT  Date of Surgery: 06/03/2015  Surgeon: Tinnie Gens, MD  Assistant: Leontine Locket PA  Pre-op Diagnosis: End Stage Renal Disease N18.6  Post-op Diagnosis: End Stage Renal Disease N18.6  Procedure: Procedure(s): LEFT UPPER ARM BRACHIOCEPHALIC FISTULA SUPERFICIALIZATION  Anesthesia: LMA  EBL: Minimal  Complications: None  Procedure Details: The patient was taken the operating room placed in the supine position at which time left upper extremity was prepped with Betadine scrub and solution draped in routine sterile manner.Initially the plan was IV  Sedation plus local anesthesia but patient was restless and it was decided the best plan would be LMA. Therefore satisfactory general LMA anesthesia was administered. Left upper extremity was prepped Betadine scrub and solution draped in routine sterile manner. The brachial cephalic AV fistula was somewhat deep in the upper arm therefore 3 skip incisions were made overlying the fistula E vein itself was comfortably mobilized there were a few small branches which are ligated with 4-0 silk ties and divided. Following mobilization of the vein the subcutaneous tissue was closed in the deeper levels of interrupted 3-0 Vicryl to bring the vein more to the surface. After this been accomplished the wounds were examined and adequate hemostasis was achieved wounds closed in one layer with subcuticular 3-0 Vicryl and Dermabond patient taken to recovery room in satisfactory condition=   Tinnie Gens, MD 06/03/2015 8:44 AM

## 2015-06-03 NOTE — Transfer of Care (Signed)
Immediate Anesthesia Transfer of Care Note  Patient: Rachel Chaney  Procedure(s) Performed: Procedure(s) with comments: LEFT UPPER ARM BRACHIOCEPHALIC FISTULA SUPERFICIALIZATION (Left) - from MAC to General  Patient Location: PACU  Anesthesia Type:General  Level of Consciousness: awake, alert  and oriented  Airway & Oxygen Therapy: Patient Spontanous Breathing and Patient connected to nasal cannula oxygen  Post-op Assessment: Report given to RN, Post -op Vital signs reviewed and stable and Patient moving all extremities X 4  Post vital signs: Reviewed and stable  Last Vitals:  Filed Vitals:   06/03/15 0648  BP: 173/59  Pulse: 74  Temp: 36.8 C  Resp: 18    Last Pain: There were no vitals filed for this visit.    Patients Stated Pain Goal: 2 ([id number])  Complications: No apparent anesthesia complications

## 2015-06-03 NOTE — Discharge Instructions (Signed)
° ° °  06/03/2015 Rachel Chaney [id number] 12-25-1960  Surgeon(s): Mal Misty, MD  Procedure(s): BRACHIOCEPHALIC FISTULA SUPERFICIALIZATION-LEFT ARM  X Do not stick graft for 4 weeks

## 2015-06-03 NOTE — Anesthesia Postprocedure Evaluation (Signed)
Anesthesia Post Note  Patient: Rachel Chaney  Procedure(s) Performed: Procedure(s) (LRB): LEFT UPPER ARM BRACHIOCEPHALIC FISTULA SUPERFICIALIZATION (Left)  Patient location during evaluation: PACU Anesthesia Type: General Level of consciousness: awake and alert Pain management: pain level controlled Vital Signs Assessment: post-procedure vital signs reviewed and stable Respiratory status: spontaneous breathing, nonlabored ventilation and respiratory function stable Cardiovascular status: blood pressure returned to baseline and stable Postop Assessment: no signs of nausea or vomiting Anesthetic complications: no    Last Vitals:  Filed Vitals:   06/03/15 0905 06/03/15 0914  BP: 136/66 136/61  Pulse: 66 69  Temp:  36.3 C  Resp: 11     Last Pain:  Filed Vitals:   06/03/15 0935  PainSc: 3                  Tiajuana Amass

## 2015-06-03 NOTE — H&P (View-Only) (Signed)
Subjective:     Patient ID: Rachel Chaney, female   DOB: 04-23-1960, 55 y.o.   MRN: [id number]  HPI this 55 year old female returns for initial follow-up regarding her left brachial-cephalic AV fistula I created on 04/15/2015. Patient is currently being dialyzed through a tunneled catheter in the right IJ. She denies any pain or numbness in the left hand.   Past Medical History  Diagnosis Date  . Diabetes mellitus   . Hyperlipidemia   . Hypertension     Social History  Substance Use Topics  . Smoking status: Never Smoker   . Smokeless tobacco: Never Used  . Alcohol Use: No    Family History  Problem Relation Age of Onset  . Heart disease Mother   . Heart disease Father     No Known Allergies   Current outpatient prescriptions:  .  acetaminophen (TYLENOL) 500 MG tablet, Take 1,000 mg by mouth every 6 (six) hours as needed for pain., Disp: , Rfl:  .  atorvastatin (LIPITOR) 80 MG tablet, Take 1 tablet (80 mg total) by mouth daily., Disp: 90 tablet, Rfl: 3 .  calcitRIOL (ROCALTROL) 0.25 MCG capsule, TAKE 1 CAPSULE (0.25 MCG TOTAL) BY MOUTH DAILY., Disp: 30 capsule, Rfl: 3 .  calcium acetate (PHOSLO) 667 MG capsule, Take 1 capsule (667 mg total) by mouth 3 (three) times daily with meals., Disp: 90 capsule, Rfl: 0 .  carvedilol (COREG) 25 MG tablet, Take 2 tablets (50 mg total) by mouth 2 (two) times daily with a meal., Disp: 60 tablet, Rfl: 11 .  cetirizine (ZYRTEC) 10 MG tablet, Take 10 mg by mouth daily as needed for allergies., Disp: , Rfl:  .  cloNIDine (CATAPRES) 0.1 MG tablet, Take 1 tablet (0.1 mg total) by mouth 2 (two) times daily., Disp: 60 tablet, Rfl: 11 .  Darbepoetin Alfa (ARANESP) 25 MCG/0.42ML SOSY injection, Inject 0.42 mLs (25 mcg total) into the vein every Monday with hemodialysis., Disp: 14.28 mL, Rfl: 0 .  glucose blood (ONE TOUCH ULTRA TEST) test strip, Use as instructed, Disp: 100 each, Rfl: 5 .  hydrALAZINE (APRESOLINE) 10 MG tablet, Take 1 tablet (10 mg  total) by mouth 3 (three) times daily., Disp: 90 tablet, Rfl: 0 .  Insulin Pen Needle 29G X 12MM MISC, Inject 1 pen into the skin 2 (two) times daily. Check blood sugar daily., Disp: 100 each, Rfl: 6 .  Insulin Pen Needle 31G X 5 MM MISC, Use as directed for insulin injection bid, Disp: 200 each, Rfl: 3 .  LANTUS SOLOSTAR 100 UNIT/ML Solostar Pen, INJECT 50 UNITS IN THE MORNING AND 30 UNITS IN THE EVENING, Disp: 3 mL, Rfl: 20 .  multivitamin (RENA-VIT) TABS tablet, Take 1 tablet by mouth at bedtime., Disp: 30 tablet, Rfl: 0 .  ONETOUCH DELICA LANCETS 99991111 MISC, 1 Device by Does not apply route as needed (to check blood glucose)., Disp: 100 each, Rfl: 3 .  Syringe, Disposable, 1 ML MISC, Use as directed, Disp: 100 each, Rfl: prn .  diclofenac sodium (VOLTAREN) 1 % GEL, Apply 2 g topically 4 (four) times daily. (Patient not taking: Reported on 02/18/2015), Disp: 100 g, Rfl: 1 .  omeprazole (PRILOSEC) 20 MG capsule, Take 1 capsule (20 mg total) by mouth daily as needed (acid reflux). (Patient not taking: Reported on 02/18/2015), Disp: 90 capsule, Rfl: 0 .  [DISCONTINUED] progesterone (PROMETRIUM) 200 MG capsule, Take 1 capsule (200 mg total) by mouth daily. Take for 10 days if bleeding occurs for more than 4  days., Disp: 10 capsule, Rfl: 1  Filed Vitals:   05/25/15 1355 05/25/15 1357  BP: 142/73 168/77  Pulse: 73 86  Temp: 100 F (37.8 C)   Resp: 16   Height: 5\' 4"  (1.626 m)   Weight: 227 lb (102.967 kg)   SpO2: 90%     Body mass index is 38.95 kg/(m^2).           Review of Systems Denies chest pain, dyspnea on exertion, PND, orthopnea, hemoptysis     Objective:   Physical Exam BP 168/77 mmHg  Pulse 86  Temp(Src) 100 F (37.8 C)  Resp 16  Ht 5\' 4"  (1.626 m)  Wt 227 lb (102.967 kg)  BMI 38.95 kg/m2  SpO2 90%  LMP 09/18/2011    Gen.-alert and oriented x3 in no apparent distress HEENT normal for age Lungs no rhonchi or wheezing Cardiovascular regular rhythm no murmurs  carotid pulses 3+ palpable no bruits audible Abdomen soft nontender no palpable masses -obese Musculoskeletal free of  major deformities Skin clear -no rashes Neurologic normal Lower extremities 3+ femoral and dorsalis pedis pulses palpable bilaterally with no edema   left upper extremity with well-healed incision in antecubital area. Excellent pulse and thrill and upper arm brachial-cephalic AV fistula. 2+ radial pulse palpable distally with well-perfused left hand. Vein is somewhat deep in the upper arm for access.       Assessment:      end-stage renal disease with nicely functioning left brachial-cehalic AV fistula-needs elevation     Plan:      plan elevation of left brachial-cephalic AV fistula on Thursday, May 25 as outpatient Patient will then be able to have this fistula utilized in [place] July and can have tunneled catheter removed

## 2015-06-03 NOTE — Anesthesia Procedure Notes (Signed)
Procedure Name: LMA Insertion Date/Time: 06/03/2015 8:03 AM Performed by: Neldon Newport Pre-anesthesia Checklist: Patient being monitored, Suction available, Emergency Drugs available, Timeout performed and Patient identified Patient Re-evaluated:Patient Re-evaluated prior to inductionOxygen Delivery Method: Circle system utilized Preoxygenation: Pre-oxygenation with 100% oxygen Intubation Type: IV induction Ventilation: Mask ventilation without difficulty LMA: LMA inserted LMA Size: 4.0 Number of attempts: 1 Airway Equipment and Method: Patient positioned with wedge pillow Placement Confirmation: positive ETCO2,  ETT inserted through vocal cords under direct vision and breath sounds checked- equal and bilateral Tube secured with: Tape Dental Injury: Teeth and Oropharynx as per pre-operative assessment

## 2015-06-04 ENCOUNTER — Telehealth: Payer: Self-pay | Admitting: Vascular Surgery

## 2015-06-04 ENCOUNTER — Encounter (HOSPITAL_COMMUNITY): Payer: Self-pay | Admitting: Vascular Surgery

## 2015-06-04 NOTE — Telephone Encounter (Signed)
Sched appt 6/20 at 8:30. Spoke to pt to inform them of appt.

## 2015-06-04 NOTE — Telephone Encounter (Signed)
-----   Message from Mena Goes, RN sent at 06/03/2015  9:49 AM EDT ----- Regarding: schedule   ----- Message -----    From: Gabriel Earing, PA-C    Sent: 06/03/2015   8:38 AM      To: [facility] Charge Pool  S/p superficialization of left arm fistula 06/03/15.  F/u with Dr. Kellie Simmering in 4 weeks.  She does not need a duplex.  Thanks, Aldona Bar

## 2015-06-21 ENCOUNTER — Encounter: Payer: Self-pay | Admitting: Vascular Surgery

## 2015-06-29 ENCOUNTER — Encounter: Payer: Self-pay | Admitting: Vascular Surgery

## 2015-06-29 ENCOUNTER — Ambulatory Visit (INDEPENDENT_AMBULATORY_CARE_PROVIDER_SITE_OTHER): Payer: Self-pay | Admitting: Vascular Surgery

## 2015-06-29 VITALS — BP 150/77 | HR 77 | Temp 97.8°F | Resp 16 | Ht 64.0 in | Wt 225.0 lb

## 2015-06-29 DIAGNOSIS — Z992 Dependence on renal dialysis: Secondary | ICD-10-CM

## 2015-06-29 DIAGNOSIS — N186 End stage renal disease: Secondary | ICD-10-CM

## 2015-06-29 NOTE — Progress Notes (Signed)
Subjective:     Patient ID: Early Chars, female   DOB: Mar 25, 1960, 55 y.o.   MRN: [id number]  HPI this 55 year old female returns for follow-up regarding recent elevation of her left brachial-cephalic AV fistula. Fistula was created 04/15/2015. She denies any pain or numbness the left hand. She has had some itching around the incision but no drainage or redness.   Review of Systems     Objective:   Physical Exam BP 150/77 mmHg  Pulse 77  Temp(Src) 97.8 F (36.6 C)  Resp 16  Ht 5\' 4"  (1.626 m)  Wt 225 lb (102.059 kg)  BMI 38.60 kg/m2  SpO2 93%  LMP 09/18/2011  Gen. well-developed well-nourished female no apparent distress alert and oriented 3 Left upper extremity with well-healed incisions with excellent pulse and palpable thrill beneath the incisions. 2+ radial pulse palpable well-perfused left hand.     Assessment:     Doing well post elevation of left brachial-cephalic AV fistula    Plan:     Okay to use fistula mid July 2017 Return to see me on a when necessary basis

## 2015-06-29 NOTE — Progress Notes (Signed)
Filed Vitals:   06/29/15 0840 06/29/15 0843  BP: 158/78 150/77  Pulse: 79 77  Temp: 97.8 F (36.6 C)   Resp: 16   Height: 5\' 4"  (1.626 m)   Weight: 225 lb (102.059 kg)   SpO2: 93%

## 2015-07-08 ENCOUNTER — Other Ambulatory Visit: Payer: Self-pay | Admitting: Family Medicine

## 2015-07-16 ENCOUNTER — Other Ambulatory Visit: Payer: Self-pay | Admitting: Student

## 2015-07-16 NOTE — Telephone Encounter (Signed)
Pt is calling because she needs a refill on her Rena-Vit called in. She said that the pharmacy has been faxing [place] with no response. jw

## 2015-07-19 NOTE — Telephone Encounter (Signed)
Pt is calling again, she is completely out.  Fleeger, Salome Spotted, CMA

## 2015-07-20 MED ORDER — RENA-VITE PO TABS: 1.0000 | ORAL_TABLET | Freq: Every day | ORAL | Status: DC

## 2015-07-20 NOTE — Telephone Encounter (Signed)
Done

## 2015-07-26 ENCOUNTER — Other Ambulatory Visit: Payer: Self-pay | Admitting: Student

## 2015-07-26 ENCOUNTER — Telehealth: Payer: Self-pay | Admitting: *Deleted

## 2015-07-26 NOTE — Telephone Encounter (Signed)
Prior Authorization received from [employer] for [employer]. Formulary preferred Basalgar (tier 1) and Levemir (tier 2).  Patient much have tried and failed the preferred medications before Lantus SoloStar would be considered for approval. Derl Barrow, RN

## 2015-07-27 NOTE — Telephone Encounter (Signed)
Pt calling in to get a refill on her lantus again, please send in or change to a different one. Deseree Kennon Holter, CMA

## 2015-07-29 MED ORDER — INSULIN DETEMIR 100 UNIT/ML ~~LOC~~ SOLN: 50.0000 [IU] | Freq: Two times a day (BID) | SUBCUTANEOUS | Status: DC

## 2015-07-29 NOTE — Telephone Encounter (Signed)
Patient is aware of med change. Rachel Chaney,CMA

## 2015-07-29 NOTE — Telephone Encounter (Signed)
Levemir covered as an alternative to Lantus. This has been sent to her pharmacy

## 2015-07-29 NOTE — Telephone Encounter (Signed)
Please complete PA or change medication.  Form placed in provider box for review.  Derl Barrow, RN

## 2015-07-29 NOTE — Telephone Encounter (Signed)
Pt called about Lantus. She says [facility] told her dr has to called [employer] to authorize her to continue using the Lantus. Lantus works for pt and is free. She only has one pen left

## 2015-08-03 ENCOUNTER — Telehealth: Payer: Self-pay | Admitting: *Deleted

## 2015-08-03 MED ORDER — INSULIN DETEMIR 100 UNIT/ML ~~LOC~~ SOLN: 50.0000 [IU] | Freq: Two times a day (BID) | SUBCUTANEOUS | 11 refills | Status: DC

## 2015-08-03 NOTE — Telephone Encounter (Signed)
Patient called and would like to get a script for the levemir pen.  She would like to have access to this form in case she is out of town and it's easier to travel with.  She is aware that insurance will not cover her picking up both at the same time.  Will forward to MD. Johnney Ou

## 2015-08-09 ENCOUNTER — Telehealth: Payer: Self-pay | Admitting: Student

## 2015-08-09 NOTE — Telephone Encounter (Signed)
Pt wants levemir pen instead of vial sent in to [facility] pharmacy. Deseree Kennon Holter, CMA

## 2015-08-09 NOTE — Telephone Encounter (Signed)
Pt's medication was called into the pharmacy today, but pt states it is the wrong one. Pt wants  the insulin pen called in to the [facility]. Thanks! ep

## 2015-08-09 NOTE — Telephone Encounter (Signed)
Please send in the Levemir Flex Pen to [facility].  Derl Barrow, RN

## 2015-08-10 NOTE — Telephone Encounter (Signed)
Please send in Levemir Pen instead of vials to [facility].  Derl Barrow, RN

## 2015-08-10 NOTE — Telephone Encounter (Signed)
Pt called again today, wanting a Rx for the levemir pen sent to [facility]. Please call pt after it has been called in. Thanks! ep

## 2015-08-11 ENCOUNTER — Telehealth: Payer: Self-pay | Admitting: *Deleted

## 2015-08-11 MED ORDER — INSULIN DETEMIR 100 UNIT/ML FLEXPEN: 50.0000 [IU] | PEN_INJECTOR | Freq: Two times a day (BID) | SUBCUTANEOUS | 11 refills | Status: DC

## 2015-08-11 NOTE — Telephone Encounter (Signed)
Pt calling in again to get levemir pen and not the vial. Please cal pt back when done. Tejal Monroy Kennon Holter, CMA

## 2015-08-11 NOTE — Telephone Encounter (Signed)
Levemir pen prescribed. Please inform patient

## 2015-08-11 NOTE — Telephone Encounter (Signed)
Patient informed that Levemir Pen was sent in this morning about 8:30 to [facility].  Patient to call nurse back if she is still having problems receiving her medication.  Derl Barrow, RN

## 2015-08-21 ENCOUNTER — Other Ambulatory Visit: Payer: Self-pay | Admitting: Student

## 2015-08-21 DIAGNOSIS — E785 Hyperlipidemia, unspecified: Secondary | ICD-10-CM

## 2015-12-07 ENCOUNTER — Telehealth: Payer: Self-pay | Admitting: *Deleted

## 2015-12-07 ENCOUNTER — Other Ambulatory Visit (HOSPITAL_COMMUNITY)
Admission: RE | Admit: 2015-12-07 | Discharge: 2015-12-07 | Disposition: A | Discharge status: Home / Self Care | Payer: Commercial Managed Care - HMO | Source: Ambulatory Visit | Attending: Family Medicine | Admitting: Family Medicine

## 2015-12-07 ENCOUNTER — Ambulatory Visit (INDEPENDENT_AMBULATORY_CARE_PROVIDER_SITE_OTHER): Payer: Commercial Managed Care - HMO | Admitting: Student

## 2015-12-07 ENCOUNTER — Encounter: Payer: Self-pay | Admitting: Student

## 2015-12-07 VITALS — BP 150/57 | HR 79 | Temp 97.6°F | Ht 64.0 in | Wt 232.0 lb

## 2015-12-07 DIAGNOSIS — E1140 Type 2 diabetes mellitus with diabetic neuropathy, unspecified: Secondary | ICD-10-CM

## 2015-12-07 DIAGNOSIS — Z0000 Encounter for general adult medical examination without abnormal findings: Secondary | ICD-10-CM

## 2015-12-07 DIAGNOSIS — N939 Abnormal uterine and vaginal bleeding, unspecified: Secondary | ICD-10-CM

## 2015-12-07 DIAGNOSIS — Z1151 Encounter for screening for human papillomavirus (HPV): Secondary | ICD-10-CM | POA: Diagnosis present

## 2015-12-07 DIAGNOSIS — I10 Essential (primary) hypertension: Secondary | ICD-10-CM | POA: Diagnosis not present

## 2015-12-07 DIAGNOSIS — Z01419 Encounter for gynecological examination (general) (routine) without abnormal findings: Secondary | ICD-10-CM | POA: Diagnosis present

## 2015-12-07 DIAGNOSIS — Z124 Encounter for screening for malignant neoplasm of cervix: Secondary | ICD-10-CM | POA: Diagnosis not present

## 2015-12-07 DIAGNOSIS — N950 Postmenopausal bleeding: Secondary | ICD-10-CM

## 2015-12-07 LAB — POCT WET PREP (WET MOUNT)
Clue Cells Wet Prep Whiff POC: NEGATIVE
Trichomonas Wet Prep HPF POC: ABSENT

## 2015-12-07 LAB — POCT GLYCOSYLATED HEMOGLOBIN (HGB A1C): Hemoglobin A1C: 9.7

## 2015-12-07 MED ORDER — INSULIN ASPART 100 UNIT/ML FLEXPEN: 4.0000 [IU] | PEN_INJECTOR | Freq: Every day | SUBCUTANEOUS | 11 refills | Status: DC

## 2015-12-07 MED ORDER — INSULIN ASPART 100 UNIT/ML ~~LOC~~ SOLN: 4.0000 [IU] | Freq: Once | SUBCUTANEOUS | 11 refills | Status: DC

## 2015-12-07 NOTE — Assessment & Plan Note (Signed)
-  Pap smear done today, will follow - Information to schedule colonoscopy and mammogram given

## 2015-12-07 NOTE — Assessment & Plan Note (Signed)
A1c 9.7 despite ESRD. WOuld have expected it to be better controlled in setting of renal disease. Will add meal time aspart to start qD then increase as needed - to follow in  Pharmacy clinic

## 2015-12-07 NOTE — Assessment & Plan Note (Signed)
Above goal at 150/57. Would likely have improved on recheck - will evaluate further at next visit

## 2015-12-07 NOTE — Telephone Encounter (Signed)
[facility] called stating they received 2 prescriptions for Novolog vail and FlexPen.  Please give pharmacy a call at [phone] to clarify which one is correct.  Derl Barrow, RN

## 2015-12-07 NOTE — Patient Instructions (Addendum)
Follow with Dr Valentina Lucks for DM check Please obtain US of your uterus for post menopausal bleeding Obtain twice as many veg's as protein or carbohydrate foods for both lunch and dinner. Please try to exercise daily at least 30 mins If you have questions or concerns,c all the office at [phone]

## 2015-12-07 NOTE — Assessment & Plan Note (Signed)
Bleeding from the cervical canal noted on pelvic exam today. Endometrial biopsy performed, will follow results. -TSH - Pelvic ultrasound

## 2015-12-07 NOTE — Progress Notes (Signed)
   Subjective:    Patient ID: Early Chars, female    DOB: 04-Aug-1960, 55 y.o.   MRN: [id number]   CC: Physical exam  HPI: 55 y/o F with HTN, DM presents for follow up  DM - A1c 9.7 today - reports compliance with levemir 50 units BID - wears protective footwear at all times  HTN - reports compliance with coreg and hydralazine - denies chest pain, SOB  ESRD - followed by Renal - reports compliance with dialysis, M/W/F  Gyn - sexually active wit hher husband - deneis vaginal bleeding - LMP 2 years ago - reports vaginal itching but no pain, discharge  Smoking status reviewed  non smoker  Review of Systems  Per HPI, else denies recent illness, fever   Objective:  BP (!) 150/57   Pulse 79   Temp 97.6 F (36.4 C) (Oral)   Ht 5\' 4"  (1.626 m)   Wt 232 lb (105.2 kg)   LMP 09/18/2011   BMI 39.82 kg/m  Vitals and nursing note reviewed  General: NAD Cardiac: RRR, normal heart sounds, no murmurs. 2+ radial and PT pulses bilaterally Respiratory: CTAB, normal effort Abdomen: soft, nontender, nondistended, no hepatic or splenomegaly. Bowel sounds present Extremities: no edema or cyanosis. WWP. Skin: warm and dry, no rashes noted Neuro: alert and oriented, no focal deficits  Pelvic: Normal EGBUS, normal vaginal canal, normal cervix but scant bright red blood noted from the cervical canal with no CMT, normal mobile uterus, normal adnexa with no masses, no adnexal tenderness, exam limited by body habitus    Assessment & Plan:    Essential hypertension Above goal at 150/57. Would likely have improved on recheck - will evaluate further at next visit  Diabetes mellitus with neuropathy A1c 9.7 despite ESRD. WOuld have expected it to be better controlled in setting of renal disease. Will add meal time aspart to start qD then increase as needed - to follow in  Pharmacy clinic  Post-menopausal bleeding Bleeding from the cervical canal noted on pelvic exam today.  Endometrial biopsy performed, will follow results. -TSH - Pelvic ultrasound   Health care maintenance -Pap smear done today, will follow - Information to schedule colonoscopy and mammogram given    Davarion Cuffee A. Lincoln Brigham MD, [facility] Resident PGY-3 Pager [phone]

## 2015-12-08 ENCOUNTER — Telehealth: Payer: Self-pay | Admitting: Student

## 2015-12-08 ENCOUNTER — Encounter: Payer: Self-pay | Admitting: Student

## 2015-12-08 LAB — BASIC METABOLIC PANEL
BUN: 36 mg/dL — ABNORMAL HIGH (ref 7–25)
CO2: 34 mmol/L — ABNORMAL HIGH (ref 20–31)
Calcium: 9 mg/dL (ref 8.6–10.4)
Chloride: 95 mmol/L — ABNORMAL LOW (ref 98–110)
Creat: 6.19 mg/dL — ABNORMAL HIGH (ref 0.50–1.05)
Glucose, Bld: 80 mg/dL (ref 65–99)
Potassium: 4 mmol/L (ref 3.5–5.3)
Sodium: 142 mmol/L (ref 135–146)

## 2015-12-08 LAB — LIPID PANEL
Cholesterol: 160 mg/dL (ref <200)
HDL: 37 mg/dL — ABNORMAL LOW (ref >50)
LDL Cholesterol: 94 mg/dL (ref <100)
Total CHOL/HDL Ratio: 4.3 Ratio (ref <5.0)
Triglycerides: 145 mg/dL (ref <150)
VLDL: 29 mg/dL (ref <30)

## 2015-12-08 LAB — CYTOLOGY - PAP
Diagnosis: NEGATIVE
HPV: NOT DETECTED

## 2015-12-08 LAB — TSH: TSH: 1.84 mIU/L

## 2015-12-08 NOTE — Telephone Encounter (Signed)
Please call patient's pharmacy to let them know the flex pen is the correct form per the patient's prference

## 2015-12-08 NOTE — Telephone Encounter (Signed)
Note copied from telephone note from today:  Please call patient's pharmacy to let them know the flex pen is the correct form per the patient's prference.    Spoke with Judson Roch at [facility] and the pen has already been refilled for patient. Laterica Matarazzo,CMA

## 2015-12-08 NOTE — Telephone Encounter (Signed)
This was documented in original telephone note. Jazmin Hartsell,CMA

## 2015-12-13 ENCOUNTER — Telehealth: Payer: Self-pay | Admitting: Student

## 2015-12-13 NOTE — Telephone Encounter (Signed)
Please inform Rachel Chaney that her endometrial biopsy was normal

## 2015-12-13 NOTE — Telephone Encounter (Signed)
Patient made aware of results and is scheduled this week for her ultrasound. Jazmin Hartsell,CMA

## 2015-12-14 ENCOUNTER — Ambulatory Visit (HOSPITAL_COMMUNITY)
Admission: RE | Admit: 2015-12-14 | Discharge: 2015-12-14 | Disposition: A | Discharge status: Home / Self Care | Payer: Commercial Managed Care - HMO | Source: Ambulatory Visit | Attending: Family Medicine | Admitting: Family Medicine

## 2015-12-14 DIAGNOSIS — D250 Submucous leiomyoma of uterus: Secondary | ICD-10-CM | POA: Diagnosis not present

## 2015-12-14 DIAGNOSIS — N950 Postmenopausal bleeding: Secondary | ICD-10-CM | POA: Insufficient documentation

## 2015-12-14 DIAGNOSIS — R938 Abnormal findings on diagnostic imaging of other specified body structures: Secondary | ICD-10-CM | POA: Insufficient documentation

## 2015-12-14 DIAGNOSIS — N939 Abnormal uterine and vaginal bleeding, unspecified: Secondary | ICD-10-CM

## 2015-12-15 ENCOUNTER — Telehealth: Payer: Self-pay | Admitting: Student

## 2015-12-15 DIAGNOSIS — N939 Abnormal uterine and vaginal bleeding, unspecified: Secondary | ICD-10-CM

## 2015-12-15 NOTE — Telephone Encounter (Signed)
Pt was returning your call. Please call back. Thanks! ep

## 2015-12-15 NOTE — Telephone Encounter (Signed)
Patient was called regarding her [place] results. A message was left asking her to call the office.

## 2015-12-15 NOTE — Telephone Encounter (Signed)
  Patient called regarding [place] and need for GYN follow up. All questions were answered to her satisfaction

## 2015-12-15 NOTE — Telephone Encounter (Signed)
Will refer to Gyn for consideration of saline infusion sono hysterogram given abnormal endometrial findings on [place].

## 2016-01-12 ENCOUNTER — Telehealth: Payer: Self-pay | Admitting: Student

## 2016-01-12 NOTE — Telephone Encounter (Signed)
I do not see that the patient is even on gabapentin. Please ask her to make an appointment to discuss her pain

## 2016-01-12 NOTE — Telephone Encounter (Signed)
Pt needs a refill on something for diabetic nerve pain. Gabapentin does not work and pt would like something else. Pt uses [facility] @ [address]. ep

## 2016-01-13 ENCOUNTER — Other Ambulatory Visit: Payer: Self-pay | Admitting: Student

## 2016-01-13 MED ORDER — GABAPENTIN 300 MG PO CAPS: 300.0000 mg | ORAL_CAPSULE | Freq: Three times a day (TID) | ORAL | 3 refills | Status: DC

## 2016-01-13 NOTE — Telephone Encounter (Signed)
I called Rachel Chaney to set up an apt for diabetic nerve pain. Rachel Chaney stated, "i was just in to see Dr. Lincoln Brigham in November for this reason, I have a busy schedule and can not come in right now." Rachel Chaney is requesting something for her pain and that gabapentin does not work. Please advise.

## 2016-01-13 NOTE — Telephone Encounter (Signed)
Called the patent regarding her concern about gabapentin. She states " it is not working". This provider has never prescribe gabapentin for her. It was last prescribed by Dr Barbaraann Faster. She has been taking 300 mg only when she has pain, up to once per day. This provider now prescribed gabapentin 300 TID for diabetic nerve pain, and instructed on how it is to be taken. She will follow with me as scheduled

## 2016-01-13 NOTE — Telephone Encounter (Signed)
Another encounter is open for this issuse. Closing this one.

## 2016-01-13 NOTE — Telephone Encounter (Signed)
Pt is calling because she would like the doctor to prescribe some medication for her diabetic nerve pain. She had taken Gabapentin at one time but that didn't help at all. jw

## 2016-01-27 ENCOUNTER — Ambulatory Visit: Payer: 59 | Admitting: Gynecology

## 2016-02-01 ENCOUNTER — Other Ambulatory Visit: Payer: Self-pay | Admitting: Student

## 2016-02-25 ENCOUNTER — Ambulatory Visit (INDEPENDENT_AMBULATORY_CARE_PROVIDER_SITE_OTHER): Payer: Commercial Managed Care - HMO | Admitting: Student

## 2016-02-25 ENCOUNTER — Encounter: Payer: Self-pay | Admitting: Student

## 2016-02-25 VITALS — BP 152/70 | HR 87 | Temp 97.8°F | Ht 64.0 in | Wt 234.0 lb

## 2016-02-25 DIAGNOSIS — I10 Essential (primary) hypertension: Secondary | ICD-10-CM

## 2016-02-25 DIAGNOSIS — E1140 Type 2 diabetes mellitus with diabetic neuropathy, unspecified: Secondary | ICD-10-CM

## 2016-02-25 DIAGNOSIS — L720 Epidermal cyst: Secondary | ICD-10-CM

## 2016-02-25 LAB — POCT GLYCOSYLATED HEMOGLOBIN (HGB A1C): Hemoglobin A1C: 8.8

## 2016-02-25 NOTE — Progress Notes (Signed)
PROCEDURE NOTE: Verbal consent obtained. Risks and benefits of the procedure were explained to the patient. Patient made an informed decision to proceed with the procedure. Betadine prep per usual protocol. Local anesthesia obtained with pain ease.  0.5 cm incision made with 11 blade along lesion.  Copious yellow serous fluid expressed. Lesion explored minimally due to patient tolerance, but there was still some concern of fluid unable to be drained No packing used  Dressed. Wound care instructions including precautions with patient. Patient tolerated the procedure well. Recheck in 48-72 hours.

## 2016-02-25 NOTE — Assessment & Plan Note (Signed)
A1c 8.8 from 9.7. Will continue current regimen. There was concern for significant diabetic neuropathy of her feet given poor sensation on exam.  - continue to follow feet health - due to eye exam - continue current insulin regimen

## 2016-02-25 NOTE — Assessment & Plan Note (Signed)
Cyst on chest drained but there was some concern for fluid pockets left behind but it was still draining - will follow early next week for consideration of further excising if it has not further drained - wound care and return precautions discussed

## 2016-02-25 NOTE — Patient Instructions (Signed)
Follow up in 1 week for cyst If you have severe pain at the site, redness or fevers return earlier If you have any questions or concerns, call the office at [phone]

## 2016-02-25 NOTE — Progress Notes (Signed)
   Subjective:    Patient ID: Rachel Chaney, female    DOB: Jun 20, 1960, 56 y.o.   MRN: [id number]   CC: Diabetes follow up, bump on chest  HPI: 56 y/o Presenting for Diabetes follow up and bump on chest  Diabetes - reports compliance with insulin - does not report periods of hypoglycemia - has not been able to tolerate gabapentin as it makes her stomach upset - denies recent infection - wears hard soled shoes at all times - due for eye exam  Bump on chest - started as a pimple several weeks ago then enlarged - it is not painful, no fevers - it has been itchy  Hypertension - reports compliance with hydralazine and coreg   Smoking status reviewed  Review of Systems  Per HPI, else denies chest pain, shortness of breath, abdominal pain, N/V/D    Objective:  BP (!) 152/70   Pulse 87   Temp 97.8 F (36.6 C) (Oral)   Ht 5\' 4"  (1.626 m)   Wt 234 lb (106.1 kg)   LMP 09/18/2011   SpO2 96%   BMI 40.17 kg/m  Vitals and nursing note reviewed  General: NAD Cardiac: RRR Respiratory: CTAB, normal effort Extremities: foot exam done, see quality metrics Skin: warm and dry, no rashes noted Neuro: alert and oriented   Assessment & Plan:    Diabetes mellitus with neuropathy A1c 8.8 from 9.7. Will continue current regimen. There was concern for significant diabetic neuropathy of her feet given poor sensation on exam.  - continue to follow feet health - due to eye exam - continue current insulin regimen   Epidermoid cyst of skin of chest Cyst on chest drained but there was some concern for fluid pockets left behind but it was still draining - will follow Rachel next week for consideration of further excising if it has not further drained - wound care and return precautions discussed  Essential hypertension Blood pressure above goal at 152/70 - will recheck at next visit and consider adding another agent if needed    Chesney Suares A. Lincoln Brigham MD, [facility]  Resident PGY-3 Pager [phone]

## 2016-02-25 NOTE — Assessment & Plan Note (Signed)
Blood pressure above goal at 152/70 - will recheck at next visit and consider adding another agent if needed

## 2016-03-02 ENCOUNTER — Ambulatory Visit: Payer: 59 | Admitting: Gynecology

## 2016-03-02 ENCOUNTER — Ambulatory Visit: Payer: Commercial Managed Care - HMO | Admitting: Student

## 2016-03-21 ENCOUNTER — Ambulatory Visit (INDEPENDENT_AMBULATORY_CARE_PROVIDER_SITE_OTHER): Payer: Commercial Managed Care - HMO | Admitting: Family Medicine

## 2016-03-21 ENCOUNTER — Encounter: Payer: Self-pay | Admitting: Family Medicine

## 2016-03-21 VITALS — BP 134/84 | HR 86 | Temp 97.8°F | Resp 17 | Ht 64.0 in | Wt 233.0 lb

## 2016-03-21 DIAGNOSIS — N186 End stage renal disease: Secondary | ICD-10-CM

## 2016-03-21 DIAGNOSIS — R109 Unspecified abdominal pain: Secondary | ICD-10-CM

## 2016-03-21 DIAGNOSIS — M62830 Muscle spasm of back: Secondary | ICD-10-CM

## 2016-03-21 LAB — POCT URINALYSIS DIP (MANUAL ENTRY)
Bilirubin, UA: NEGATIVE
Glucose, UA: 250 — AB
Ketones, POC UA: NEGATIVE
Nitrite, UA: NEGATIVE
Protein Ur, POC: >=300 — AB
Spec Grav, UA: 1.015
Urobilinogen, UA: 0.2
pH, UA: 8.5 — AB

## 2016-03-21 LAB — POC MICROSCOPIC URINALYSIS (UMFC): Mucus: ABSENT

## 2016-03-21 MED ORDER — CYCLOBENZAPRINE HCL 5 MG PO TABS: 5.0000 mg | ORAL_TABLET | Freq: Three times a day (TID) | ORAL | 0 refills | Status: DC | PRN

## 2016-03-21 NOTE — Patient Instructions (Addendum)
IF you received an x-ray today, you will receive an invoice from [facility]. Please contact [facility] at [phone] with questions or concerns regarding your invoice.   IF you received labwork today, you will receive an invoice from [facility]. Please contact [facility] at [phone] with questions or concerns regarding your invoice.   Our billing staff will not be able to assist you with questions regarding bills from these companies.  You will be contacted with the lab results as soon as they are available. The fastest way to get your results is to activate your My Chart account. Instructions are located on the last page of this paperwork. If you have not heard from [place] regarding the results in 2 weeks, please contact this office.      Mid-Back Strain Rehab Ask your health care provider which exercises are safe for you. Do exercises exactly as told by your health care provider and adjust them as directed. It is normal to feel mild stretching, pulling, tightness, or discomfort as you do these exercises, but you should stop right away if you feel sudden pain or your pain gets worse. Do not begin these exercises until told by your health care provider. Stretching and range of motion exercises This exercise warms up your muscles and joints and improves the movement and flexibility of your back and shoulders. This exercise also help to relieve pain. Exercise A: Chest and spine stretch   1. Lie down on your back on a firm surface. 2. Roll a towel or a small blanket so it is about 4 inches (10 cm) in diameter. 3. Put the towel lengthwise under the middle of your back so it is under your spine, but not under your shoulder blades. 4. To increase the stretch, you may put your hands behind your head and let your elbows fall to your sides. 5. Hold for __________ seconds. Repeat exercise __________ times. Complete this exercise __________ times a day. Strengthening  exercises These exercises build strength and endurance in your back and your shoulder blade muscles. Endurance is the ability to use your muscles for a long time, even after they get tired. Exercise B: Alternating arm and leg raises   1. Get on your hands and knees on a firm surface. If you are on a hard floor, you may want to use padding to cushion your knees, such as an exercise mat. 2. Line up your arms and legs. Your hands should be below your shoulders, and your knees should be below your hips. 3. Lift your left leg behind you. At the same time, raise your right arm and straighten it in front of you.  Do not lift your leg higher than your hip.  Do not lift your arm higher than your shoulder.  Keep your abdominal and back muscles tight.  Keep your hips facing the ground.  Do not arch your back.  Keep your balance carefully, and do not hold your breath. 4. Hold for __________ seconds. 5. Slowly return to the starting position and repeat with your right leg and your left arm. Repeat __________ times. Complete this exercise __________ times a day. Exercise C: Straight arm rows (  shoulder extension) 1. Stand with your feet shoulder width apart. 2. Secure an exercise band to a stable object in front of you so the band is at or above shoulder height. 3. Hold one end of the exercise band in each hand. 4. Straighten your elbows and lift your hands up  to shoulder height. 5. Step back, away from the secured end of the exercise band, until the band stretches. 6. Squeeze your shoulder blades together and pull your hands down to the sides of your thighs. Stop when your hands are straight down by your sides. Do not let your hands go behind your body. 7. Hold for __________ seconds. 8. Slowly return to the starting position. Repeat __________ times. Complete this exercise __________ times a day. Exercise D: Shoulder external rotation, prone 1. Lie on your abdomen on a firm bed so your left /  right forearm hangs over the edge of the bed and your upper arm is on the bed, straight out from your body.  Your elbow should be bent.  Your palm should be facing your feet. 2. If instructed, hold a __________ weight in your hand. 3. Squeeze your shoulder blade toward the middle of your back. Do not let your shoulder lift toward your ear. 4. Keep your elbow bent in an "L" shape (90 degrees) while you slowly move your forearm up toward the ceiling. Move your forearm up to the height of the bed, toward your head.  Your upper arm should not move.  At the top of the movement, your palm should face the floor. 5. Hold for __________ seconds. 6. Slowly return to the starting position and relax your muscles. Repeat __________ times. Complete this exercise __________ times a day. Exercise E: Scapular retraction and external rotation, rowing  1. Sit in a stable chair without armrests, or stand. 2. Secure an exercise band to a stable object in front of you so it is at shoulder height. 3. Hold one end of the exercise band in each hand. 4. Bring your arms out straight in front of you. 5. Step back, away from the secured end of the exercise band, until the band stretches. 6. Pull the band backward. As you do this, bend your elbows and squeeze your shoulder blades together, but avoid letting the rest of your body move. Do not let your shoulders lift up toward your ears. 7. Stop when your elbows are at your sides or slightly behind your body. 8. Hold for __________ seconds. 9. Slowly straighten your arms to return to the starting position. Repeat __________ times. Complete this exercise __________ times a day. Posture and body mechanics  Body mechanics refers to the movements and positions of your body while you do your daily activities. Posture is part of body mechanics. Good posture and healthy body mechanics can help to relieve stress in your body's tissues and joints. Good posture means that your  spine is in its natural S-curve position (your spine is neutral), your shoulders are pulled back slightly, and your head is not tipped forward. The following are general guidelines for applying improved posture and body mechanics to your everyday activities. Standing   When standing, keep your spine neutral and your feet about hip-width apart. Keep a slight bend in your knees. Your ears, shoulders, and hips should line up.  When you do a task in which you lean forward while standing in one place for a long time, place one foot up on a stable object that is 2-4 inches (5-10 cm) high, such as a footstool. This helps keep your spine neutral. Sitting   When sitting, keep your spine neutral and keep your feet flat on the floor. Use a footrest, if necessary, and keep your thighs parallel to the floor. Avoid rounding your shoulders, and avoid tilting your head  forward.  When working at a desk or a computer, keep your desk at a height where your hands are slightly lower than your elbows. Slide your chair under your desk so you are close enough to maintain good posture.  When working at a computer, place your monitor at a height where you are looking straight ahead and you do not have to tilt your head forward or downward to look at the screen. Resting  When lying down and resting, avoid positions that are most painful for you.  If you have pain with activities such as sitting, bending, stooping, or squatting (flexion-based activities), lie in a position in which your body does not bend very much. For example, avoid curling up on your side with your arms and knees near your chest (fetal position).  If you have pain with activities such as standing for a long time or reaching with your arms (extension-based activities), lie with your spine in a neutral position and bend your knees slightly. Try the following positions:  Lying on your side with a pillow between your knees.  Lying on your back with a  pillow under your knees. Lifting   When lifting objects, keep your feet at least shoulder-width apart and tighten your abdominal muscles.  Bend your knees and hips and keep your spine neutral. It is important to lift using the strength of your legs, not your back. Do not lock your knees straight out.  Always ask for help to lift heavy or awkward objects. This information is not intended to replace advice given to you by your health care provider. Make sure you discuss any questions you have with your health care provider. Document Released: 12/26/2004 Document Revised: 09/02/2015 Document Reviewed: 10/07/2014 Elsevier Interactive Patient Education  2017 [employer].

## 2016-03-21 NOTE — Progress Notes (Signed)
Subjective:  By signing my name below, I, Essence Howell, attest that this documentation has been prepared under the direction and in the presence of Delman Cheadle, MD Electronically Signed: Ladene Artist, ED Scribe 03/21/2016 at 10:16 AM.   Patient ID: Rachel Chaney, female    DOB: [birth year], 58 y.o.   MRN: [id number]  Chief Complaint  Patient presents with  . Back Pain    of unknown origin    HPI Rachel Chaney is a 56 y.o. female who presents to Primary Care at [facility] complaining of ongoing left-sided aching back pain that radiates into the buttocks for the past week. No known back injury. She describes pain as spasms and reports increased pain with movement and walking. Pr states that she has been sleeping on the right side with some relief. She has also tried a heating pad and Tylenol every 4 relief with mild relief. Pt denies constipation, changes in urine, fever, chills, nausea, vomiting, weakness or numbness in extremities, numbness, rashes, burning sensation. She reports a h/o CKD. Pt is dialyzed every MWF and states that she is still able to urinate.  Past Medical History:  Diagnosis Date  . CHF (congestive heart failure) ([place])   . Chronic kidney disease   . Diabetes mellitus   . Hyperlipidemia   . Hypertension    Current Outpatient Prescriptions on File Prior to Visit  Medication Sig Dispense Refill  . acetaminophen (TYLENOL) 500 MG tablet Take 1,000 mg by mouth every 6 (six) hours as needed for pain.    Marland Kitchen atorvastatin (LIPITOR) 80 MG tablet TAKE 1 TABLET BY MOUTH ONCE A DAY 90 tablet 3  . B-D UF III MINI PEN NEEDLES 31G X 5 MM MISC USE AS DIRECTED FOR INSULIN INJECTION TWICE DAILY 200 each 3  . calcitRIOL (ROCALTROL) 0.25 MCG capsule TAKE 1 CAPSULE (0.25 MCG TOTAL) BY MOUTH DAILY. 30 capsule 3  . calcium acetate (PHOSLO) 667 MG capsule Take 1 capsule (667 mg total) by mouth 3 (three) times daily with meals. 90 capsule 0  . carvedilol (COREG) 25 MG tablet TAKE 2 TABLETS  BY MOUTH 2TIMES DAILY WITH MEALS 60 tablet 11  . Darbepoetin Alfa (ARANESP) 25 MCG/0.42ML SOSY injection Inject 0.42 mLs (25 mcg total) into the vein every Monday with hemodialysis. 14.28 mL 0  . diclofenac sodium (VOLTAREN) 1 % GEL Apply 2 g topically 4 (four) times daily. 100 g 1  . glucose blood (ONE TOUCH ULTRA TEST) test strip Use as instructed 100 each 5  . hydrALAZINE (APRESOLINE) 50 MG tablet TAKE 1 TABLET (50 MG TOTAL) BY MOUTH THREE TIMES DAILY.    Marland Kitchen insulin aspart (NOVOLOG) 100 UNIT/ML FlexPen Inject 4 Units into the skin daily. At dinner 15 mL 11  . insulin aspart (NOVOLOG) 100 UNIT/ML injection Inject 4 Units into the skin once. With Dinner 10 mL 11  . insulin detemir (LEVEMIR) 100 UNIT/ML injection Inject 0.5 mLs (50 Units total) into the skin 2 (two) times daily. Inject 50 units in the morning and 30 units in the evening 10 mL 11  . Insulin Detemir (LEVEMIR) 100 UNIT/ML Pen Inject 50 Units into the skin 2 (two) times daily. 15 mL 11  . Insulin Pen Needle 29G X 12MM MISC Inject 1 pen into the skin 2 (two) times daily. Check blood sugar daily. 100 each 6  . multivitamin (RENA-VIT) TABS tablet Take 1 tablet by mouth at bedtime. 30 tablet 10  . ONETOUCH DELICA LANCETS 57D MISC 1 Device by  Does not apply route as needed (to check blood glucose). 100 each 3  . ULTICARE INSULIN SYRINGE 30G X 5/16" 1 ML MISC USE AS DIRECTED 100 each 0  . [DISCONTINUED] progesterone (PROMETRIUM) 200 MG capsule Take 1 capsule (200 mg total) by mouth daily. Take for 10 days if bleeding occurs for more than 4 days. 10 capsule 1   No current facility-administered medications on file prior to visit.    No Known Allergies   Review of Systems  Constitutional: Negative for chills and fever.  Gastrointestinal: Negative for constipation and vomiting.  Genitourinary: Negative for difficulty urinating, frequency and hematuria.  Musculoskeletal: Positive for back pain.  Skin: Negative for rash.  Neurological:  Negative for weakness and numbness.      Objective:   Physical Exam  Constitutional: She is oriented to person, place, and time. She appears well-developed and well-nourished. No distress.  HENT:  Head: Normocephalic and atraumatic.  Eyes: Conjunctivae and EOM are normal.  Neck: Neck supple. No tracheal deviation present.  Cardiovascular: Normal rate and regular rhythm.   Murmur heard.  Systolic (injection) murmur is present with a grade of 2/6  Pulmonary/Chest: Effort normal and breath sounds normal. No respiratory distress.  Abdominal: Soft. Bowel sounds are normal. She exhibits no distension. There is no tenderness. There is no CVA tenderness.  Musculoskeletal: Normal range of motion.  No point tenderness over the cervical, thoracic or lumbar spine. No paraspinal tenderness though palpable muscle spasms along the R. No CVA tenderness. Palpable spasms along the R thoracic and lumbar muscle laterally as well. No tenderness over the SI joints.  Neurological: She is alert and oriented to person, place, and time.  Skin: Skin is warm and dry.  Psychiatric: She has a normal mood and affect. Her behavior is normal.  Nursing note and vitals reviewed.  BP 134/84   Pulse 86   Temp 97.8 F (36.6 C) (Oral)   Resp 17   Ht 5\' 4"  (1.626 m)   Wt 233 lb (105.7 kg)   LMP 09/18/2011   SpO2 97%   BMI 39.99 kg/m      Results for orders placed or performed in visit on 03/21/16  POCT urinalysis dipstick  Result Value Ref Range   Color, UA yellow yellow   Clarity, UA clear clear   Glucose, UA =250 (A) negative   Bilirubin, UA negative negative   Ketones, POC UA negative negative   Spec Grav, UA 1.015 1.003, 1.005, 1.010, 1.015, 1.020, 1.025, 1.030, 1.035   Blood, UA moderate (A) negative   pH, UA 8.5 (A) 5.0, 5.5, 6.0, 6.5, 7.0, 7.5, 8.0   Protein Ur, POC >=300 (A) negative   Urobilinogen, UA 0.2 0.2, 1.0   Nitrite, UA Negative Negative   Leukocytes, UA Trace (A) Negative  POCT  Microscopic Urinalysis ([facility])  Result Value Ref Range   WBC,UR,HPF,POC Moderate (A) None WBC/hpf   RBC,UR,HPF,POC Few (A) None RBC/hpf   Bacteria Many (A) None, Too numerous to count   Mucus Absent Absent   Epithelial Cells, UR Per Microscopy Moderate (A) None, Too numerous to count cells/hpf    Assessment & Plan:   1. Back muscle spasm   2. Left flank pain    3.     ESRD - dirty catch, pt on dialysis  Orders Placed This Encounter  Procedures  . Care order/instruction:    Scheduling Instructions:     Complete orders, AVS and go.  Marland Kitchen POCT urinalysis dipstick  . POCT Microscopic  Urinalysis ([facility])    Meds ordered this encounter  Medications  . cyclobenzaprine (FLEXERIL) 5 MG tablet    Sig: Take 1 tablet (5 mg total) by mouth 3 (three) times daily as needed for muscle spasms. May increase dose to 2 tabs before bed if needed    Dispense:  30 tablet    Refill:  0    I personally performed the services described in this documentation, which was scribed in my presence. The recorded information has been reviewed and considered, and addended by me as needed.   Delman Cheadle, M.D.  Primary Care at [facility] [address] [place], [place] [address] [phone] phone [phone] fax  04/03/16 2:51 PM

## 2016-04-29 ENCOUNTER — Other Ambulatory Visit: Payer: Self-pay | Admitting: Student

## 2016-05-24 ENCOUNTER — Encounter: Payer: Self-pay | Admitting: Gynecology

## 2016-07-07 ENCOUNTER — Other Ambulatory Visit: Payer: Self-pay | Admitting: Student

## 2016-07-14 NOTE — Telephone Encounter (Signed)
Needs refill on hydroxeline.  [facility]

## 2016-07-17 NOTE — Telephone Encounter (Signed)
Rx refilled. However, per last visit 02/25/16 BP was uncontrolled. Can patient please be called to make appointment for BP recheck?

## 2016-07-17 NOTE — Telephone Encounter (Signed)
Patient is aware that script has been called in and also that she will need an appointment but she is driving and will call back. Rachel Chaney,CMA

## 2016-08-22 ENCOUNTER — Telehealth: Payer: Self-pay | Admitting: *Deleted

## 2016-08-22 ENCOUNTER — Ambulatory Visit (INDEPENDENT_AMBULATORY_CARE_PROVIDER_SITE_OTHER): Payer: 59 | Admitting: Family Medicine

## 2016-08-22 ENCOUNTER — Encounter: Payer: Self-pay | Admitting: Family Medicine

## 2016-08-22 VITALS — BP 128/66 | HR 77 | Temp 98.4°F | Ht 64.0 in | Wt 236.0 lb

## 2016-08-22 DIAGNOSIS — E1140 Type 2 diabetes mellitus with diabetic neuropathy, unspecified: Secondary | ICD-10-CM | POA: Diagnosis not present

## 2016-08-22 DIAGNOSIS — Z0000 Encounter for general adult medical examination without abnormal findings: Secondary | ICD-10-CM

## 2016-08-22 DIAGNOSIS — I10 Essential (primary) hypertension: Secondary | ICD-10-CM | POA: Diagnosis not present

## 2016-08-22 LAB — POCT GLYCOSYLATED HEMOGLOBIN (HGB A1C): Hemoglobin A1C: 8.6

## 2016-08-22 MED ORDER — FREESTYLE SYSTEM KIT: 1.0000 | PACK | 0 refills | Status: DC | PRN

## 2016-08-22 MED ORDER — GLUCOSE BLOOD VI STRP: ORAL_STRIP | 5 refills | Status: DC

## 2016-08-22 NOTE — Assessment & Plan Note (Signed)
Uncontrolled but a1c improved to 8.6 today from 8.8 in Feb. Recommended starting novolog to patient who declined at this time. - Continue levemir 50U BID - patient has DM eye exam scheduled for 08/31/16 - Patient plans to try regular exercise - Counseled on diabetic diet - Follow up in 2 months with log, will discuss restarting novolog at that time

## 2016-08-22 NOTE — Telephone Encounter (Signed)
Katie from [employer] called stating they need specific directions for the test strips.  Also patient's insurance will only cover [employer]. They are requesting a new Rx for OneTouch meter.  Derl Barrow, RN

## 2016-08-22 NOTE — Assessment & Plan Note (Signed)
Controlled. Doing well on current regimen. Will continue coreg 25mg  BID and hydralazine 50mg  q6h. No changes today

## 2016-08-22 NOTE — Assessment & Plan Note (Signed)
Compliant on atorvastatin 80mg  qd, no changes today.

## 2016-08-22 NOTE — Progress Notes (Signed)
    Subjective:  Rachel Chaney is a 56 y.o. female who presents to the [facility] today for diabetes follow up  HPI:     DIABETES  Disease Monitoring, checks sugars at home in morning at after dinner, does not have log with her today Blood Sugar ranges-am fasting avg 120, highest 140, lowest 75. After dinner is higher, avg 160, highest in 200s Polyuria- none  New Visual problems- none  Medications: on levemir 50UBID, was previously on novolog 4U at night but was told to stop last year by previous PCP because "levels were good"  Compliance- good Hypoglycemic symptoms- none  Would like to try more regular exercise before adding back novolog at this time because "didn't feel like made much of a difference before" thought she states that currently at 3days a week because limited d/t diabetic foot pain that is chronic for her.  HYPERTENSION  Disease Monitoring , does not monitor at home Chest pain- none       Dyspnea- none Medications: coreg 25mg  BID and hydralazine 50mg  q6h Compliance- good  Lightheadedness- none    Edema- none    HYPERLIPIDEMIA  See symptoms for Hypertension  Medications: atorvastatin 80mg  qd Compliance- none  RUQ pain- none   Muscle aches- none    ROS See HPI above   PMH Smoking Status noted   Objective:  Physical Exam: BP 128/66   Pulse 77   Temp 98.4 F (36.9 C) (Oral)   Ht 5\' 4"  (1.626 m)   Wt 236 lb (107 kg)   LMP 09/18/2011   SpO2 99%   BMI 40.51 kg/m   Gen: NAD, resting comfortably CV: RRR with no murmurs appreciated Pulm: NWOB, CTAB with no crackles, wheezes, or rhonchi GI: Normal bowel sounds present. Soft, Nontender, Nondistended. MSK: no edema, cyanosis, or clubbing noted. Foot exam without lesions or wounds, intact to monofilament  Skin: warm, dry Neuro: grossly normal, moves all extremities Psych: Normal affect and thought content  Results for orders placed or performed in visit on 08/22/16 (from the past 72 hour(s))  HgB A1c      Status: Abnormal   Collection Time: 08/22/16  8:28 AM  Result Value Ref Range   Hemoglobin A1C 8.6      Assessment/Plan:  Diabetes mellitus with neuropathy Uncontrolled but a1c improved to 8.6 today from 8.8 in Feb. Recommended starting novolog to patient who declined at this time. - Continue levemir 50U BID - patient has DM eye exam scheduled for 08/31/16 - Patient plans to try regular exercise - Counseled on diabetic diet - Follow up in 2 months with log, will discuss restarting novolog at that time  Essential hypertension Controlled. Doing well on current regimen. Will continue coreg 25mg  BID and hydralazine 50mg  q6h. No changes today  HYPERLIPIDEMIA Compliant on atorvastatin 80mg  qd, no changes today.   Health maintenance - Screening mammogram ordered today - Referral for colonoscopy placed today  Bufford Lope, DO PGY-2, [place] [facility] 08/22/2016 8:34 AM

## 2016-08-22 NOTE — Patient Instructions (Addendum)
It was good to see you today!  For your diabetes, - Continue levemir 50U twice a day - Make sure you get your diabetes eye exam and you can have them send [place] a copy of the results - Please bring your blood sugar log with you to your next visit in 2 months. We may need to start back novolog - We talked about exercise today  For your blood pressure, everything looks good no changes today.  For your health maintenance  - Please get your mammogram done - Someone will call you about setting up your colonoscopy, let us know if you have not heard back in 2 weeks.   Please check-out at the front desk before leaving the clinic. Make an appointment in 2 months with your blood sugar logs.  Please bring all of your medications with you to each visit.   Sign up for My Chart to have easy access to your labs results, and communication with your primary care physician.  Feel free to call with any questions or concerns at any time, at [phone].   Take care,  Dr. Bufford Lope, [place]

## 2016-08-23 MED ORDER — GLUCOSE BLOOD VI STRP: 1.0000 | ORAL_STRIP | 5 refills | Status: DC | PRN

## 2016-08-23 MED ORDER — GLUCOSE BLOOD VI STRP: 1.0000 | ORAL_STRIP | Freq: Three times a day (TID) | 5 refills | Status: DC

## 2016-08-23 MED ORDER — ONETOUCH ULTRA 2 W/DEVICE KIT: 1.0000 | PACK | Freq: Three times a day (TID) | 0 refills | Status: DC

## 2016-08-23 NOTE — Addendum Note (Signed)
Addended by: Bufford Lope on: 08/23/2016 08:23 AM   Modules accepted: Orders

## 2016-08-23 NOTE — Telephone Encounter (Signed)
[facility] called stating they needed Rx for Onetouch Meter and strips. They needed specific directions and ICD-10 code for medicare billing.  Meter and strips sent in under faculty provider Dr. Lanna Poche, Rosine Beat, RN

## 2016-08-23 NOTE — Telephone Encounter (Addendum)
Patient already has Onetouch meter. Rx for test resent with new directions. Freestyle was ordered at patient request, can patient please be called and informed that Freestyle is not covered by her insurance.

## 2016-08-23 NOTE — Addendum Note (Signed)
Addended by: Derl Barrow on: 08/23/2016 04:58 PM   Modules accepted: Orders

## 2016-08-23 NOTE — Telephone Encounter (Signed)
Pt contacted and informed no insurance coverage for freestyle meter. Pt stated she did not know this and would like to use onetouch meters. Just fyi.

## 2016-09-14 ENCOUNTER — Other Ambulatory Visit: Payer: Self-pay | Admitting: *Deleted

## 2016-09-14 MED ORDER — ATORVASTATIN CALCIUM 80 MG PO TABS: 80.0000 mg | ORAL_TABLET | Freq: Every day | ORAL | 3 refills | Status: DC

## 2016-09-22 ENCOUNTER — Encounter: Payer: Self-pay | Admitting: Family Medicine

## 2016-09-28 ENCOUNTER — Other Ambulatory Visit: Payer: Self-pay | Admitting: Family Medicine

## 2016-09-28 DIAGNOSIS — Z1231 Encounter for screening mammogram for malignant neoplasm of breast: Secondary | ICD-10-CM

## 2016-11-09 ENCOUNTER — Other Ambulatory Visit: Payer: Self-pay | Admitting: *Deleted

## 2016-11-09 MED ORDER — FREESTYLE SYSTEM KIT: 1.0000 | PACK | 0 refills | Status: DC | PRN

## 2016-11-09 NOTE — Telephone Encounter (Signed)
Patient left message on nurse line stating that she needs rx for Freestyle Meter and Supplies sent to pharmacy.

## 2016-11-20 ENCOUNTER — Telehealth: Payer: Self-pay | Admitting: Family Medicine

## 2016-11-20 NOTE — Telephone Encounter (Signed)
Pt is calling because she needs the [facility] called in. We call in the Freestyle Lite. The only other problem is that she needs [place] to call the insurance and get this Laurie Panda because since she pick up the first she can not return it and her insurance weill not pay for another one. jw

## 2016-11-20 NOTE — Telephone Encounter (Signed)
Attempted to call patient to discuss that If patient desires CGM monitoring via the [facility] meter that best course of action would be for her to be seen in pharm clinic. However, voicemail full and was not able to have a message.

## 2016-11-21 NOTE — Telephone Encounter (Signed)
Spoke with patient and asked her why she wanted to change meters.  She stated it was for the convenience of not having to stick her finger constantly.  Advised patient that since already picked up one meter and it was covered by her insurance, this wasn't a reason for them to cover a different one for her.  Patient voiced understanding and doesn't need an appointment to discuss this with Dr. Valentina Lucks.  She would still like a script for the freestyle libre pro and will plan to just buy it out of pocket.  Jazmin Hartsell,CMA

## 2016-11-22 NOTE — Telephone Encounter (Signed)
Spoke with patient directly. She has never tried the [employer] and just likes the idea of not sticking her finger constantly. Advised that she go to pharm clinic to discuss the CGM monitoring device to see if it is a good fit her before she takes the step of buying the [place] meter out of pocket. Patient agrees that this is a good course of action but that she currently is too busy to come in for appointment to pharm clinic and will call back. Please assist patient in scheduling pharm clinic appointment when she is ready.

## 2017-01-25 ENCOUNTER — Other Ambulatory Visit: Payer: Self-pay | Admitting: *Deleted

## 2017-01-25 MED ORDER — CARVEDILOL 25 MG PO TABS: 25.0000 mg | ORAL_TABLET | Freq: Two times a day (BID) | ORAL | 3 refills | Status: DC

## 2017-02-02 ENCOUNTER — Telehealth: Payer: Self-pay | Admitting: Family Medicine

## 2017-02-02 NOTE — Telephone Encounter (Signed)
LM for patient to call back.  She will need an appointment to be evaluated for her cough before we can send in medication. Jazmin Hartsell,CMA

## 2017-02-02 NOTE — Telephone Encounter (Signed)
Pt wants some cough syrup called into the [place] [facility]. She has been using over the counter and it has not been working. Please advise

## 2017-02-26 ENCOUNTER — Other Ambulatory Visit: Payer: Self-pay | Admitting: *Deleted

## 2017-02-26 MED ORDER — INSULIN DETEMIR 100 UNIT/ML FLEXPEN: PEN_INJECTOR | SUBCUTANEOUS | 0 refills | Status: DC

## 2017-02-26 NOTE — Telephone Encounter (Signed)
One time refill as patient is overdue for DM appt

## 2017-02-27 NOTE — Telephone Encounter (Signed)
Tried contacting patient but was unable to reach her and her voicemail was full.  Will continue to try and reach but if not I will mail her a letter. Herald Vallin,CMA

## 2017-02-28 NOTE — Telephone Encounter (Signed)
Tried calling patient again but was unable to reach and no option for VM this time.  Will mail a letter asking her to call us to make a diabetes follow up. Jazmin Hartsell,CMA

## 2017-03-09 ENCOUNTER — Other Ambulatory Visit: Payer: Self-pay | Admitting: *Deleted

## 2017-03-09 MED ORDER — INSULIN PEN NEEDLE 31G X 5 MM MISC: 3 refills | Status: DC

## 2017-03-09 MED ORDER — ONETOUCH DELICA LANCETS 33G MISC: 1.0000 | 3 refills | Status: DC | PRN

## 2017-03-09 NOTE — Telephone Encounter (Signed)
Patient stated that she was currently at dialysis and would call us back to schedule a diabetes follow up.  Alysha Doolan,CMA

## 2017-03-09 NOTE — Telephone Encounter (Signed)
Overdue for diabetes follow up, can patient please be called to schedule.

## 2017-03-09 NOTE — Addendum Note (Signed)
Addended by: Valerie Roys on: 03/09/2017 08:35 AM   Modules accepted: Orders

## 2017-03-22 ENCOUNTER — Other Ambulatory Visit: Payer: Self-pay | Admitting: Family Medicine

## 2017-04-12 ENCOUNTER — Ambulatory Visit (INDEPENDENT_AMBULATORY_CARE_PROVIDER_SITE_OTHER): Payer: 59 | Admitting: Family Medicine

## 2017-04-12 ENCOUNTER — Other Ambulatory Visit: Payer: Self-pay

## 2017-04-12 ENCOUNTER — Encounter: Payer: Self-pay | Admitting: Family Medicine

## 2017-04-12 VITALS — BP 120/68 | HR 72 | Temp 98.6°F | Wt 234.6 lb

## 2017-04-12 DIAGNOSIS — J302 Other seasonal allergic rhinitis: Secondary | ICD-10-CM | POA: Insufficient documentation

## 2017-04-12 DIAGNOSIS — I10 Essential (primary) hypertension: Secondary | ICD-10-CM

## 2017-04-12 DIAGNOSIS — E785 Hyperlipidemia, unspecified: Secondary | ICD-10-CM | POA: Diagnosis not present

## 2017-04-12 DIAGNOSIS — E1140 Type 2 diabetes mellitus with diabetic neuropathy, unspecified: Secondary | ICD-10-CM

## 2017-04-12 LAB — POCT GLYCOSYLATED HEMOGLOBIN (HGB A1C): Hemoglobin A1C: 8.6

## 2017-04-12 MED ORDER — FLUTICASONE PROPIONATE 50 MCG/ACT NA SUSP: 1.0000 | Freq: Every day | NASAL | 12 refills | Status: DC

## 2017-04-12 NOTE — Progress Notes (Signed)
    Subjective:  Rachel Chaney is a 57 y.o. female who presents to the [facility] today for diabetes follow up  HPI:    DIABETES  Disease Monitoring: Blood sugars regularly checked at home. Morning fastings avg 120-130s, evenings ~200s, bedtimes ~140s. Lowest reading 75, felt bad, needed orange juice. Only happened once just before dialysis, no other lows. Medications: levemir 50U BID, NovoLog 5 units with dinner which is her biggest meal Compliance-good  Polyuria-no new Visual problems-no. Last eye exam was 08/31/16 Has not been doing well following a diabetic diet, is planning on cutting back on her salt and sugar intake.  Is interested in salt free seasoning Has started walking one day a week for the past 2 months.  HYPERTENSION  Medications taking Coreg 25 mg twice daily, hydralazine 50 mg every 6h.  Tolerating well. Compliance- good  Chest pain- no      Dyspnea- no Lightheadedness- no   Edema- no      HYPERLIPIDEMIA  Disease Monitoring  See symptoms for Hypertension  Medications on atorvastatin 80 mg daily Compliance- good RUQ pain- no  Muscle aches- no      ROS: Per HPI  PMH: She reports that she has never smoked. She has never used smokeless tobacco. She reports that she does not drink alcohol or use drugs.  Objective:  Physical Exam: BP 120/68   Pulse 72   Temp 98.6 F (37 C) (Oral)   Wt 234 lb 9.6 oz (106.4 kg)   LMP 09/18/2011   SpO2 (!) 88%   BMI 40.27 kg/m  Recheck SpO2 98% Gen: NAD, resting comfortably CV: RRR with no murmurs appreciated Pulm: NWOB, CTAB with no crackles, wheezes, or rhonchi GI: Normal bowel sounds present. Soft, Nontender, Nondistended. MSK: no edema, cyanosis, or clubbing noted Skin: warm, dry Neuro: grossly normal, moves all extremities Psych: Normal affect and thought content  Results for orders placed or performed in visit on 04/12/17 (from the past 72 hour(s))  HgB A1c     Status: Abnormal   Collection Time: 04/12/17  8:25 AM    Result Value Ref Range   Hemoglobin A1C 8.6      Assessment/Plan:  Diabetes mellitus with neuropathy ([place]) Uncontrolled. A1c is still 8.6 despite patient making some healthy lifestyle changes. Based on patient reported home glucose readings, likely having post prandial spikes.  - continue levemir 25U BID - novolog 5U BID with lunch and dinner since those are patient's two biggest meals - will get records from last eye exam  Essential hypertension Controlled and at goal. Doing well on current medication regimen of  Coreg 25 mg twice daily, hydralazine 50 mg every 6h so will continue. Check BMP today.  Hyperlipidemia Doing well on atorvastatin 80mg  daily, check lipid panel today.  Follow up in 2-4 weeks with home glucose log for diabetes follow up  Bufford Lope, DO PGY-2, [facility] 04/12/2017 8:33 AM

## 2017-04-12 NOTE — Assessment & Plan Note (Signed)
Doing well on atorvastatin 80mg  daily, check lipid panel today.

## 2017-04-12 NOTE — Patient Instructions (Signed)
It was good to see you today!  Diabetes, - Keep levemir 50U twice a day - Novolog 5 units at lunch and 5 units with dinner - Come back in 2-4 weeks with your home sugar log  Blood pressure looks good. No changes to medications  For your allergies, - Keep doing the claritinn - start flonase 1 spray in each nostril, can increase to 2 sprays if needed   We are checking some labs today. If results require attention, either myself or my nurse will get in touch with you. If everything is normal, you will get a letter in the mail or a message in My Chart. Please give [place] a call if you do not hear from [place] after 2 weeks.  Please check-out at the front desk before leaving the clinic. Make an appointment in  2-4 weeks for diabetes follow up.  Please bring all of your medications with you to each visit.   Sign up for My Chart to have easy access to your labs results, and communication with your primary care physician.  Feel free to call with any questions or concerns at any time, at [phone].   Take care,  Dr. Bufford Lope, [place]

## 2017-04-12 NOTE — Assessment & Plan Note (Signed)
Uncontrolled. A1c is still 8.6 despite patient making some healthy lifestyle changes. Based on patient reported home glucose readings, likely having post prandial spikes.  - continue levemir 25U BID - novolog 5U BID with lunch and dinner since those are patient's two biggest meals - will get records from last eye exam

## 2017-04-12 NOTE — Assessment & Plan Note (Signed)
Controlled and at goal. Doing well on current medication regimen of  Coreg 25 mg twice daily, hydralazine 50 mg every 6h so will continue. Check BMP today.

## 2017-04-13 LAB — BASIC METABOLIC PANEL
BUN/Creatinine Ratio: 5 — ABNORMAL LOW (ref 9–23)
BUN: 41 mg/dL — ABNORMAL HIGH (ref 6–24)
CO2: 26 mmol/L (ref 20–29)
Calcium: 8.3 mg/dL — ABNORMAL LOW (ref 8.7–10.2)
Chloride: 93 mmol/L — ABNORMAL LOW (ref 96–106)
Creatinine, Ser: 8.33 mg/dL — ABNORMAL HIGH (ref 0.57–1.00)
GFR calc Af Amer: 6 mL/min/{1.73_m2} — ABNORMAL LOW (ref >59)
GFR calc non Af Amer: 5 mL/min/{1.73_m2} — ABNORMAL LOW (ref >59)
Glucose: 115 mg/dL — ABNORMAL HIGH (ref 65–99)
Potassium: 4.1 mmol/L (ref 3.5–5.2)
Sodium: 142 mmol/L (ref 134–144)

## 2017-04-13 LAB — CBC
Hematocrit: 35.4 % (ref 34.0–46.6)
Hemoglobin: 11.4 g/dL (ref 11.1–15.9)
MCH: 30.4 pg (ref 26.6–33.0)
MCHC: 32.2 g/dL (ref 31.5–35.7)
MCV: 94 fL (ref 79–97)
Platelets: 242 10*3/uL (ref 150–379)
RBC: 3.75 x10E6/uL — ABNORMAL LOW (ref 3.77–5.28)
RDW: 17.1 % — ABNORMAL HIGH (ref 12.3–15.4)
WBC: 8.3 10*3/uL (ref 3.4–10.8)

## 2017-04-13 LAB — LIPID PANEL
Chol/HDL Ratio: 2.9 ratio (ref 0.0–4.4)
Cholesterol, Total: 94 mg/dL — ABNORMAL LOW (ref 100–199)
HDL: 32 mg/dL — ABNORMAL LOW (ref >39)
LDL Calculated: 44 mg/dL (ref 0–99)
Triglycerides: 90 mg/dL (ref 0–149)
VLDL Cholesterol Cal: 18 mg/dL (ref 5–40)

## 2017-04-13 LAB — LDL CHOLESTEROL, DIRECT: LDL Direct: 42 mg/dL (ref 0–99)

## 2017-04-16 ENCOUNTER — Encounter: Payer: Self-pay | Admitting: Family Medicine

## 2017-04-25 ENCOUNTER — Other Ambulatory Visit: Payer: Self-pay | Admitting: Family Medicine

## 2017-05-21 ENCOUNTER — Ambulatory Visit (INDEPENDENT_AMBULATORY_CARE_PROVIDER_SITE_OTHER): Payer: 59 | Admitting: Student

## 2017-05-21 ENCOUNTER — Other Ambulatory Visit: Payer: Self-pay

## 2017-05-21 ENCOUNTER — Encounter: Payer: Self-pay | Admitting: Student

## 2017-05-21 VITALS — BP 162/74 | HR 78 | Temp 97.7°F | Ht 64.0 in | Wt 240.2 lb

## 2017-05-21 DIAGNOSIS — M549 Dorsalgia, unspecified: Secondary | ICD-10-CM | POA: Diagnosis not present

## 2017-05-21 MED ORDER — CYCLOBENZAPRINE HCL 10 MG PO TABS: 10.0000 mg | ORAL_TABLET | Freq: Every day | ORAL | 0 refills | Status: DC

## 2017-05-21 NOTE — Patient Instructions (Signed)
It was great seeing you today! We have addressed the following issues today  Back pain: This is likely due to back muscle strain.  We gave you a prescription for muscle relaxer.  Please take this medication at night before you go to bed.  Please avoid driving or doing something that needs focus or concentration.  If we did any lab work today, and the results require attention, either me or my nurse will get in touch with you. If everything is normal, you will get a letter in mail and a message via . If you don't hear from [place] in two weeks, please give [place] a call. Otherwise, we look forward to seeing you again at your next visit. If you have any questions or concerns before then, please call the clinic at [phone].  Please bring all your medications to every doctors visit  Sign up for My Chart to have easy access to your labs results, and communication with your Primary care physician.    Please check-out at the front desk before leaving the clinic.    Take Care,   Dr. Cyndia Skeeters

## 2017-05-21 NOTE — Progress Notes (Signed)
  Subjective:    Rachel Chaney is a 57 y.o. old female here for left back pain.  She is here with her husband.  HPI Left back pain: For 3 days.  Spontaneous onset.  Denies unusual activity, trauma, fall or injury.  Pain from his left shoulder down to her bottom.  Describes the pain as soreness.  Pain is on and off.  Pain is worse with walking.  No alleviating factor.  Pain progression worse.  She denies numbness, tingling or weakness in her legs.  She denies fever, chills, urinary retention, bowel or bladder issue, saddle anesthesia or history of cancer.  She is on HD but continues to make urine.  She admits to HD today due to her pain.  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   has a past medical history of CHF (congestive heart failure) ([place]), Chronic kidney disease, Diabetes mellitus, Hyperlipidemia, and Hypertension.  FH:  Family History  Problem Relation Age of Onset  . Heart disease Mother   . Heart disease Father     SH Social History   Tobacco Use  . Smoking status: Never Smoker  . Smokeless tobacco: Never Used  Substance Use Topics  . Alcohol use: No    Alcohol/week: 0.0 oz  . Drug use: No    Review of Systems Review of systems negative except for pertinent positives and negatives in history of present illness above.     Objective:     Vitals:   05/21/17 1441  BP: (!) 162/74  Pulse: 78  Temp: 97.7 F (36.5 C)  TempSrc: Oral  SpO2: 98%  Weight: 240 lb 3.2 oz (109 kg)  Height: 5\' 4"  (1.626 m)   Body mass index is 41.23 kg/m.  Physical Exam  GEN: appears well & comfortable. No apparent distress. RESP: no  IWOB GU: no suprapubic or CVA tenderness MSK: Back & LE exam  Normal skin, spine with normal alignment and no deformity. LE symmetric appears symmetric  No step offs, no tenderness to palpation over spines or paraspinous muscles.    Neuro exam in LE: motor 5/5 in all muscle groups, light sensation intact in L4-S1 dermatomes  SKIN: no apparent skin lesion NEURO: alert and oiented appropriately, no gross deficits   PSYCH: euthymic mood with congruent affect     Assessment and Plan:  1. Other acute back pain: Likely muscle spasm.  Exam within normal limits.  She has no red flags for infectious process, fracture, malignancy or other serious condition.  Given prescription for Flexeril 10 mg nightly.  Discussed about potential adverse effects and recommended against driving after taking this medication. - cyclobenzaprine (FLEXERIL) 10 MG tablet; Take 1 tablet (10 mg total) by mouth at bedtime.  Dispense: 20 tablet; Refill: 0  Return if symptoms worsen or fail to improve.  Mercy Riding, MD 05/21/17 Pager: [phone]

## 2017-06-01 ENCOUNTER — Other Ambulatory Visit: Payer: Self-pay | Admitting: Family Medicine

## 2017-06-29 ENCOUNTER — Other Ambulatory Visit: Payer: Self-pay | Admitting: Family Medicine

## 2017-07-02 ENCOUNTER — Other Ambulatory Visit: Payer: Self-pay | Admitting: *Deleted

## 2017-07-03 MED ORDER — CARVEDILOL 25 MG PO TABS: 25.0000 mg | ORAL_TABLET | Freq: Two times a day (BID) | ORAL | 3 refills | Status: DC

## 2017-07-18 ENCOUNTER — Other Ambulatory Visit: Payer: Self-pay | Admitting: Family Medicine

## 2017-07-19 NOTE — Telephone Encounter (Signed)
Rx refilled. Due for DM follow up, can patient please be informed.

## 2017-07-20 NOTE — Telephone Encounter (Signed)
Patient scheduled for 08-23-17 with Dr. Shawna Orleans.  Rachel Chaney [facility]

## 2017-08-06 ENCOUNTER — Other Ambulatory Visit: Payer: Self-pay | Admitting: Family Medicine

## 2017-08-23 ENCOUNTER — Other Ambulatory Visit: Payer: Self-pay

## 2017-08-23 ENCOUNTER — Ambulatory Visit (INDEPENDENT_AMBULATORY_CARE_PROVIDER_SITE_OTHER): Payer: Self-pay | Admitting: Family Medicine

## 2017-08-23 ENCOUNTER — Encounter: Payer: Self-pay | Admitting: Family Medicine

## 2017-08-23 VITALS — BP 136/80 | HR 76 | Temp 97.7°F | Wt 240.0 lb

## 2017-08-23 DIAGNOSIS — E1140 Type 2 diabetes mellitus with diabetic neuropathy, unspecified: Secondary | ICD-10-CM

## 2017-08-23 DIAGNOSIS — Z1211 Encounter for screening for malignant neoplasm of colon: Secondary | ICD-10-CM

## 2017-08-23 DIAGNOSIS — E785 Hyperlipidemia, unspecified: Secondary | ICD-10-CM

## 2017-08-23 DIAGNOSIS — I10 Essential (primary) hypertension: Secondary | ICD-10-CM

## 2017-08-23 DIAGNOSIS — Z1231 Encounter for screening mammogram for malignant neoplasm of breast: Secondary | ICD-10-CM

## 2017-08-23 DIAGNOSIS — Z1239 Encounter for other screening for malignant neoplasm of breast: Secondary | ICD-10-CM

## 2017-08-23 LAB — POCT GLYCOSYLATED HEMOGLOBIN (HGB A1C): HbA1c, POC (controlled diabetic range): 9.1 % — AB (ref 0.0–7.0)

## 2017-08-23 NOTE — Assessment & Plan Note (Signed)
Uncontrolled.  A1c actually worsened.  9.1 from 8.6.  Likely in the setting of medication noncompliance.  Discussed continuing Levemir 50 mg twice a day with remembering to take her nighttime dose.  Discussed that patient would likely need to go back on NovoLog but she is very resistant at this time.  She will return in 1 month with her glucometer, at which time we will review her afternoon readings to assess for need for NovoLog.

## 2017-08-23 NOTE — Patient Instructions (Signed)
It was good to see you today!  For your diabetes, take your levemir regularly twice a day. Come back in 1 month with your glucometer and we can see if you need novolog again.    Get your mammogram   Referral to gastroenterology placed today to talk about colonoscopy, if you have not heard back after 2 weeks give [place] a call  Get your diabetic eye exam and have them send [place] a copy.      Please bring all of your medications with you to each visit.   Sign up for My Chart to have easy access to your labs results, and communication with your primary care physician.  Feel free to call with any questions or concerns at any time, at [phone].   Take care,  Dr. Bufford Lope, [place]

## 2017-08-23 NOTE — Assessment & Plan Note (Signed)
Stable and well controlled.  Continue Coreg 25 mg daily and hydralazine 50 mg every 6 hours.

## 2017-08-23 NOTE — Assessment & Plan Note (Signed)
Stable and doing well on atorvastatin 80 mg daily.

## 2017-08-23 NOTE — Progress Notes (Signed)
Subjective:  Rachel Chaney is a 57 y.o. female who presents to the [facility] today for diabetes follow up  HPI:    DIABETES  Disease Monitoring: Blood Sugar ranges- states morning fastings have been averaging in the 120s to 140s.  Ranging from 90-140s.  Afternoon sugars have been higher more in the mid 200s, ranging from 250-300 Medication: Taking Levemir 50 units twice daily.  Stopped her NovoLog because she thought her sugars were good.  She does miss her nighttime Levemir quite frequently because she forgets to take it Compliance-not good.  hypoglycemic symptoms- no  Polyuria- yes New Visual problems- no  Diabetic eye exam per patient is up-to-date, due again soon.  She goes to see [facility]  She also voiced high motivation to exercise.  HYPERTENSION  Medications: on coreg 25mg  twice daily and hydralazine 50 mg every 6 hours Compliance-good  Chest pain- no      Dyspnea- no  Lightheadedness- no   Edema- no      HYPERLIPIDEMIA  Medications: Atorvastatin 80 mg daily Compliance- no RUQ pain- no  Muscle aches- no     ROS: Per HPI  PMH: She reports that she has never smoked. She has never used smokeless tobacco. She reports that she does not drink alcohol or use drugs.   Objective:  Physical Exam: BP 136/80   Pulse 76   Temp 97.7 F (36.5 C) (Oral)   Wt 240 lb (108.9 kg)   LMP 09/18/2011   SpO2 91%   BMI 41.20 kg/m   Gen: NAD, resting comfortably CV: RRR with no murmurs appreciated Pulm: NWOB, CTAB with no crackles, wheezes, or rhonchi GI: Normal bowel sounds present. Soft, Nontender, Nondistended. MSK: no edema, cyanosis, or clubbing noted Skin: warm, dry Neuro: grossly normal, moves all extremities Psych: Normal affect and thought content  Diabetic Foot Exam - Simple   Simple Foot Form Diabetic Foot exam was performed with the following findings:  Yes 08/23/2017  8:46 AM  Visual Inspection No deformities, no ulcerations, no other skin breakdown bilaterally:   Yes Sensation Testing Intact to touch and monofilament testing bilaterally:  Yes Pulse Check Posterior Tibialis and Dorsalis pulse intact bilaterally:  Yes Comments     Results for orders placed or performed in visit on 08/23/17 (from the past 72 hour(s))  HgB A1c     Status: Abnormal   Collection Time: 08/23/17  8:43 AM  Result Value Ref Range   Hemoglobin A1C     HbA1c POC (<> result, manual entry)     HbA1c, POC (prediabetic range)     HbA1c, POC (controlled diabetic range) 9.1 (A) 0.0 - 7.0 %     Assessment/Plan:  Diabetes mellitus with neuropathy ([facility]) Uncontrolled.  A1c actually worsened.  9.1 from 8.6.  Likely in the setting of medication noncompliance.  Discussed continuing Levemir 50 mg twice a day with remembering to take her nighttime dose.  Discussed that patient would likely need to go back on NovoLog but she is very resistant at this time.  She will return in 1 month with her glucometer, at which time we will review her afternoon readings to assess for need for NovoLog.   Essential hypertension Stable and well controlled.  Continue Coreg 25 mg daily and hydralazine 50 mg every 6 hours.  Hyperlipidemia Stable and doing well on atorvastatin 80 mg daily.   Breast cancer screening Patient instructed to get routine screening mammogram - MM Digital Screening; Future  Colon cancer screening Patient  agreeable to colonoscopy. Referral placed per orders. - Ambulatory referral to Gastroenterology   Bufford Lope, DO PGY-3, [facility] 08/23/2017 8:36 AM

## 2017-10-08 ENCOUNTER — Other Ambulatory Visit: Payer: Self-pay | Admitting: Family Medicine

## 2017-10-09 NOTE — Telephone Encounter (Signed)
LM for patient to call back and schedule an appointment.  Jazmin Hartsell,CMA  

## 2017-10-09 NOTE — Telephone Encounter (Signed)
Needs appt, can they please be called to schedule

## 2017-10-11 ENCOUNTER — Telehealth: Payer: Self-pay

## 2017-10-11 NOTE — Telephone Encounter (Signed)
See previous note. Called pt to schedule an appt for refill of medications. Ottis Stain, CMA

## 2017-10-11 NOTE — Telephone Encounter (Signed)
Pt LVM on nurse line asking for refill of medications. Per previous note, pt needs to schedule an appt to be seen for refills. If pt calls, please schedule her an appt. Ottis Stain, CMA

## 2017-10-16 ENCOUNTER — Other Ambulatory Visit: Payer: Self-pay

## 2017-10-16 ENCOUNTER — Encounter: Payer: Self-pay | Admitting: Family Medicine

## 2017-10-16 NOTE — Telephone Encounter (Signed)
Pt lm on nurse line again.  Attempted to call back.  No answer and VM was full. Will await callback .Mordche Hedglin, Salome Spotted, CMA

## 2017-10-17 ENCOUNTER — Telehealth: Payer: Self-pay

## 2017-10-17 MED ORDER — ATORVASTATIN CALCIUM 80 MG PO TABS: 80.0000 mg | ORAL_TABLET | Freq: Every day | ORAL | 3 refills | Status: DC

## 2017-10-17 MED ORDER — INSULIN DETEMIR 100 UNIT/ML FLEXPEN: PEN_INJECTOR | SUBCUTANEOUS | 3 refills | Status: DC

## 2017-10-17 MED ORDER — CARVEDILOL 25 MG PO TABS: 25.0000 mg | ORAL_TABLET | Freq: Two times a day (BID) | ORAL | 3 refills | Status: DC

## 2017-10-17 MED ORDER — HYDRALAZINE HCL 50 MG PO TABS: 50.0000 mg | ORAL_TABLET | Freq: Three times a day (TID) | ORAL | 3 refills | Status: DC

## 2017-10-17 NOTE — Telephone Encounter (Signed)
Pt called back- I informed her refills were sent to her pharmacy and an apt was made for November.

## 2017-10-17 NOTE — Telephone Encounter (Signed)
Pt LVM on nurse line asking for a call back, she did not leave any additional info. I called her back, had to LVM.

## 2017-10-17 NOTE — Telephone Encounter (Signed)
Patient needs diabetes follow up appointment. Can patient please be informed. She will need to bring her home glucose readings with her.

## 2017-10-17 NOTE — Telephone Encounter (Signed)
Tried to reach patient but voicemail was full.  Will try again later today and then will mail a letter if she can't be reached via phone.  Jazmin Hartsell,CMA

## 2017-11-22 ENCOUNTER — Ambulatory Visit: Payer: Self-pay | Admitting: Family Medicine

## 2018-01-07 ENCOUNTER — Other Ambulatory Visit: Payer: Self-pay | Admitting: Family Medicine

## 2018-01-22 ENCOUNTER — Ambulatory Visit: Payer: Self-pay | Admitting: Family Medicine

## 2018-02-25 ENCOUNTER — Other Ambulatory Visit: Payer: Self-pay

## 2018-02-25 ENCOUNTER — Inpatient Hospital Stay (HOSPITAL_COMMUNITY)
Admission: EM | Admit: 2018-02-25 | Discharge: 2018-03-14 | DRG: 616 | Disposition: A | Discharge status: Skilled Nursing Facility | Payer: Medicare Other | Attending: Family Medicine | Admitting: Family Medicine

## 2018-02-25 ENCOUNTER — Emergency Department (HOSPITAL_COMMUNITY): Payer: Medicare Other

## 2018-02-25 ENCOUNTER — Encounter (HOSPITAL_COMMUNITY): Payer: Self-pay

## 2018-02-25 DIAGNOSIS — Z09 Encounter for follow-up examination after completed treatment for conditions other than malignant neoplasm: Secondary | ICD-10-CM

## 2018-02-25 DIAGNOSIS — E662 Morbid (severe) obesity with alveolar hypoventilation: Secondary | ICD-10-CM | POA: Diagnosis present

## 2018-02-25 DIAGNOSIS — Z9981 Dependence on supplemental oxygen: Secondary | ICD-10-CM | POA: Diagnosis not present

## 2018-02-25 DIAGNOSIS — S92919B Unspecified fracture of unspecified toe(s), initial encounter for open fracture: Secondary | ICD-10-CM | POA: Diagnosis present

## 2018-02-25 DIAGNOSIS — I469 Cardiac arrest, cause unspecified: Secondary | ICD-10-CM | POA: Diagnosis not present

## 2018-02-25 DIAGNOSIS — E785 Hyperlipidemia, unspecified: Secondary | ICD-10-CM | POA: Diagnosis present

## 2018-02-25 DIAGNOSIS — R918 Other nonspecific abnormal finding of lung field: Secondary | ICD-10-CM | POA: Diagnosis not present

## 2018-02-25 DIAGNOSIS — Z6837 Body mass index (BMI) 37.0-37.9, adult: Secondary | ICD-10-CM

## 2018-02-25 DIAGNOSIS — L089 Local infection of the skin and subcutaneous tissue, unspecified: Secondary | ICD-10-CM

## 2018-02-25 DIAGNOSIS — N186 End stage renal disease: Secondary | ICD-10-CM | POA: Diagnosis present

## 2018-02-25 DIAGNOSIS — J9691 Respiratory failure, unspecified with hypoxia: Secondary | ICD-10-CM | POA: Diagnosis not present

## 2018-02-25 DIAGNOSIS — M869 Osteomyelitis, unspecified: Secondary | ICD-10-CM

## 2018-02-25 DIAGNOSIS — J9602 Acute respiratory failure with hypercapnia: Secondary | ICD-10-CM | POA: Diagnosis not present

## 2018-02-25 DIAGNOSIS — L97514 Non-pressure chronic ulcer of other part of right foot with necrosis of bone: Secondary | ICD-10-CM | POA: Diagnosis present

## 2018-02-25 DIAGNOSIS — I5032 Chronic diastolic (congestive) heart failure: Secondary | ICD-10-CM | POA: Diagnosis present

## 2018-02-25 DIAGNOSIS — R55 Syncope and collapse: Secondary | ICD-10-CM | POA: Diagnosis not present

## 2018-02-25 DIAGNOSIS — I081 Rheumatic disorders of both mitral and tricuspid valves: Secondary | ICD-10-CM | POA: Diagnosis present

## 2018-02-25 DIAGNOSIS — I10 Essential (primary) hypertension: Secondary | ICD-10-CM | POA: Diagnosis present

## 2018-02-25 DIAGNOSIS — E11621 Type 2 diabetes mellitus with foot ulcer: Secondary | ICD-10-CM | POA: Diagnosis present

## 2018-02-25 DIAGNOSIS — I82409 Acute embolism and thrombosis of unspecified deep veins of unspecified lower extremity: Secondary | ICD-10-CM | POA: Diagnosis not present

## 2018-02-25 DIAGNOSIS — E1142 Type 2 diabetes mellitus with diabetic polyneuropathy: Secondary | ICD-10-CM | POA: Diagnosis present

## 2018-02-25 DIAGNOSIS — K219 Gastro-esophageal reflux disease without esophagitis: Secondary | ICD-10-CM | POA: Diagnosis present

## 2018-02-25 DIAGNOSIS — R197 Diarrhea, unspecified: Secondary | ICD-10-CM | POA: Diagnosis not present

## 2018-02-25 DIAGNOSIS — I361 Nonrheumatic tricuspid (valve) insufficiency: Secondary | ICD-10-CM | POA: Diagnosis not present

## 2018-02-25 DIAGNOSIS — E11319 Type 2 diabetes mellitus with unspecified diabetic retinopathy without macular edema: Secondary | ICD-10-CM | POA: Diagnosis present

## 2018-02-25 DIAGNOSIS — I82611 Acute embolism and thrombosis of superficial veins of right upper extremity: Secondary | ICD-10-CM | POA: Diagnosis not present

## 2018-02-25 DIAGNOSIS — E1165 Type 2 diabetes mellitus with hyperglycemia: Secondary | ICD-10-CM | POA: Diagnosis present

## 2018-02-25 DIAGNOSIS — I480 Paroxysmal atrial fibrillation: Secondary | ICD-10-CM | POA: Diagnosis not present

## 2018-02-25 DIAGNOSIS — E1151 Type 2 diabetes mellitus with diabetic peripheral angiopathy without gangrene: Secondary | ICD-10-CM | POA: Diagnosis present

## 2018-02-25 DIAGNOSIS — Z992 Dependence on renal dialysis: Secondary | ICD-10-CM | POA: Diagnosis not present

## 2018-02-25 DIAGNOSIS — I2699 Other pulmonary embolism without acute cor pulmonale: Secondary | ICD-10-CM | POA: Diagnosis not present

## 2018-02-25 DIAGNOSIS — Y848 Other medical procedures as the cause of abnormal reaction of the patient, or of later complication, without mention of misadventure at the time of the procedure: Secondary | ICD-10-CM | POA: Diagnosis not present

## 2018-02-25 DIAGNOSIS — D631 Anemia in chronic kidney disease: Secondary | ICD-10-CM | POA: Diagnosis present

## 2018-02-25 DIAGNOSIS — Y835 Amputation of limb(s) as the cause of abnormal reaction of the patient, or of later complication, without mention of misadventure at the time of the procedure: Secondary | ICD-10-CM | POA: Diagnosis not present

## 2018-02-25 DIAGNOSIS — J9690 Respiratory failure, unspecified, unspecified whether with hypoxia or hypercapnia: Secondary | ICD-10-CM

## 2018-02-25 DIAGNOSIS — M868X7 Other osteomyelitis, ankle and foot: Secondary | ICD-10-CM | POA: Diagnosis present

## 2018-02-25 DIAGNOSIS — I959 Hypotension, unspecified: Secondary | ICD-10-CM | POA: Diagnosis not present

## 2018-02-25 DIAGNOSIS — M549 Dorsalgia, unspecified: Secondary | ICD-10-CM | POA: Diagnosis not present

## 2018-02-25 DIAGNOSIS — Z8249 Family history of ischemic heart disease and other diseases of the circulatory system: Secondary | ICD-10-CM

## 2018-02-25 DIAGNOSIS — J9601 Acute respiratory failure with hypoxia: Secondary | ICD-10-CM | POA: Diagnosis not present

## 2018-02-25 DIAGNOSIS — E43 Unspecified severe protein-calorie malnutrition: Secondary | ICD-10-CM | POA: Diagnosis present

## 2018-02-25 DIAGNOSIS — Y92230 Patient room in hospital as the place of occurrence of the external cause: Secondary | ICD-10-CM | POA: Diagnosis not present

## 2018-02-25 DIAGNOSIS — R4189 Other symptoms and signs involving cognitive functions and awareness: Secondary | ICD-10-CM

## 2018-02-25 DIAGNOSIS — E1140 Type 2 diabetes mellitus with diabetic neuropathy, unspecified: Secondary | ICD-10-CM | POA: Diagnosis not present

## 2018-02-25 DIAGNOSIS — A4101 Sepsis due to Methicillin susceptible Staphylococcus aureus: Secondary | ICD-10-CM | POA: Diagnosis not present

## 2018-02-25 DIAGNOSIS — Y92003 Bedroom of unspecified non-institutional (private) residence as the place of occurrence of the external cause: Secondary | ICD-10-CM | POA: Diagnosis not present

## 2018-02-25 DIAGNOSIS — J95851 Ventilator associated pneumonia: Secondary | ICD-10-CM | POA: Diagnosis not present

## 2018-02-25 DIAGNOSIS — Z79899 Other long term (current) drug therapy: Secondary | ICD-10-CM

## 2018-02-25 DIAGNOSIS — E1169 Type 2 diabetes mellitus with other specified complication: Principal | ICD-10-CM | POA: Diagnosis present

## 2018-02-25 DIAGNOSIS — I313 Pericardial effusion (noninflammatory): Secondary | ICD-10-CM | POA: Diagnosis not present

## 2018-02-25 DIAGNOSIS — Z7951 Long term (current) use of inhaled steroids: Secondary | ICD-10-CM

## 2018-02-25 DIAGNOSIS — N2581 Secondary hyperparathyroidism of renal origin: Secondary | ICD-10-CM | POA: Diagnosis present

## 2018-02-25 DIAGNOSIS — E1122 Type 2 diabetes mellitus with diabetic chronic kidney disease: Secondary | ICD-10-CM | POA: Diagnosis present

## 2018-02-25 DIAGNOSIS — T8781 Dehiscence of amputation stump: Secondary | ICD-10-CM | POA: Diagnosis not present

## 2018-02-25 DIAGNOSIS — S2243XA Multiple fractures of ribs, bilateral, initial encounter for closed fracture: Secondary | ICD-10-CM | POA: Diagnosis not present

## 2018-02-25 DIAGNOSIS — S92411A Displaced fracture of proximal phalanx of right great toe, initial encounter for closed fracture: Secondary | ICD-10-CM | POA: Diagnosis present

## 2018-02-25 DIAGNOSIS — G9341 Metabolic encephalopathy: Secondary | ICD-10-CM | POA: Diagnosis not present

## 2018-02-25 DIAGNOSIS — J302 Other seasonal allergic rhinitis: Secondary | ICD-10-CM | POA: Diagnosis present

## 2018-02-25 DIAGNOSIS — S92514B Nondisplaced fracture of proximal phalanx of right lesser toe(s), initial encounter for open fracture: Secondary | ICD-10-CM | POA: Diagnosis not present

## 2018-02-25 DIAGNOSIS — S92531B Displaced fracture of distal phalanx of right lesser toe(s), initial encounter for open fracture: Secondary | ICD-10-CM | POA: Diagnosis present

## 2018-02-25 DIAGNOSIS — Z794 Long term (current) use of insulin: Secondary | ICD-10-CM

## 2018-02-25 DIAGNOSIS — L7682 Other postprocedural complications of skin and subcutaneous tissue: Secondary | ICD-10-CM | POA: Diagnosis not present

## 2018-02-25 DIAGNOSIS — I132 Hypertensive heart and chronic kidney disease with heart failure and with stage 5 chronic kidney disease, or end stage renal disease: Secondary | ICD-10-CM | POA: Diagnosis present

## 2018-02-25 DIAGNOSIS — W2209XA Striking against other stationary object, initial encounter: Secondary | ICD-10-CM | POA: Diagnosis present

## 2018-02-25 DIAGNOSIS — R0603 Acute respiratory distress: Secondary | ICD-10-CM | POA: Diagnosis not present

## 2018-02-25 DIAGNOSIS — J9600 Acute respiratory failure, unspecified whether with hypoxia or hypercapnia: Secondary | ICD-10-CM

## 2018-02-25 DIAGNOSIS — J9692 Respiratory failure, unspecified with hypercapnia: Secondary | ICD-10-CM | POA: Diagnosis not present

## 2018-02-25 DIAGNOSIS — D539 Nutritional anemia, unspecified: Secondary | ICD-10-CM | POA: Diagnosis not present

## 2018-02-25 DIAGNOSIS — R011 Cardiac murmur, unspecified: Secondary | ICD-10-CM | POA: Diagnosis present

## 2018-02-25 DIAGNOSIS — Z01818 Encounter for other preprocedural examination: Secondary | ICD-10-CM

## 2018-02-25 DIAGNOSIS — J810 Acute pulmonary edema: Secondary | ICD-10-CM | POA: Diagnosis not present

## 2018-02-25 DIAGNOSIS — L02611 Cutaneous abscess of right foot: Secondary | ICD-10-CM | POA: Diagnosis present

## 2018-02-25 DIAGNOSIS — IMO0002 Reserved for concepts with insufficient information to code with codable children: Secondary | ICD-10-CM | POA: Diagnosis present

## 2018-02-25 HISTORY — DX: Type 2 diabetes mellitus with diabetic chronic kidney disease: E11.22

## 2018-02-25 LAB — COMPREHENSIVE METABOLIC PANEL
ALT: 18 U/L (ref 0–44)
AST: 15 U/L (ref 15–41)
Albumin: 2.6 g/dL — ABNORMAL LOW (ref 3.5–5.0)
Alkaline Phosphatase: 85 U/L (ref 38–126)
Anion gap: 15 (ref 5–15)
BUN: 44 mg/dL — ABNORMAL HIGH (ref 6–20)
CO2: 30 mmol/L (ref 22–32)
Calcium: 8.6 mg/dL — ABNORMAL LOW (ref 8.9–10.3)
Chloride: 90 mmol/L — ABNORMAL LOW (ref 98–111)
Creatinine, Ser: 8.98 mg/dL — ABNORMAL HIGH (ref 0.44–1.00)
GFR calc Af Amer: 5 mL/min — ABNORMAL LOW (ref >60)
GFR calc non Af Amer: 4 mL/min — ABNORMAL LOW (ref >60)
Glucose, Bld: 69 mg/dL — ABNORMAL LOW (ref 70–99)
Potassium: 3.7 mmol/L (ref 3.5–5.1)
Sodium: 135 mmol/L (ref 135–145)
Total Bilirubin: 0.6 mg/dL (ref 0.3–1.2)
Total Protein: 6.9 g/dL (ref 6.5–8.1)

## 2018-02-25 LAB — CBC WITH DIFFERENTIAL/PLATELET
Abs Immature Granulocytes: 0.28 10*3/uL — ABNORMAL HIGH (ref 0.00–0.07)
Basophils Absolute: 0.1 10*3/uL (ref 0.0–0.1)
Basophils Relative: 0 %
Eosinophils Absolute: 0.2 10*3/uL (ref 0.0–0.5)
Eosinophils Relative: 1 %
HCT: 36.9 % (ref 36.0–46.0)
Hemoglobin: 11.1 g/dL — ABNORMAL LOW (ref 12.0–15.0)
Immature Granulocytes: 1 %
Lymphocytes Relative: 8 %
Lymphs Abs: 2.1 10*3/uL (ref 0.7–4.0)
MCH: 31 pg (ref 26.0–34.0)
MCHC: 30.1 g/dL (ref 30.0–36.0)
MCV: 103.1 fL — ABNORMAL HIGH (ref 80.0–100.0)
Monocytes Absolute: 1.8 10*3/uL — ABNORMAL HIGH (ref 0.1–1.0)
Monocytes Relative: 7 %
Neutro Abs: 22.7 10*3/uL — ABNORMAL HIGH (ref 1.7–7.7)
Neutrophils Relative %: 83 %
Platelets: 247 10*3/uL (ref 150–400)
RBC: 3.58 MIL/uL — ABNORMAL LOW (ref 3.87–5.11)
RDW: 15 % (ref 11.5–15.5)
WBC: 27 10*3/uL — ABNORMAL HIGH (ref 4.0–10.5)
nRBC: 0 % (ref 0.0–0.2)

## 2018-02-25 LAB — GLUCOSE, CAPILLARY
Glucose-Capillary: 107 mg/dL — ABNORMAL HIGH (ref 70–99)
Glucose-Capillary: 129 mg/dL — ABNORMAL HIGH (ref 70–99)

## 2018-02-25 MED ORDER — FERRIC CITRATE 1 GM 210 MG(FE) PO TABS
420.0000 mg | ORAL_TABLET | Freq: Three times a day (TID) | ORAL | Status: DC
  Administered 2018-02-26 – 2018-03-14 (×35): 420 mg via ORAL
  Filled 2018-02-25 (×50): qty 2

## 2018-02-25 MED ORDER — HEPARIN SODIUM (PORCINE) 5000 UNIT/ML IJ SOLN
5000.0000 [IU] | Freq: Three times a day (TID) | INTRAMUSCULAR | Status: DC
  Administered 2018-02-25 – 2018-02-26 (×4): 5000 [IU] via SUBCUTANEOUS
  Filled 2018-02-25 (×4): qty 1

## 2018-02-25 MED ORDER — HYDRALAZINE HCL 50 MG PO TABS
50.0000 mg | ORAL_TABLET | Freq: Three times a day (TID) | ORAL | Status: DC
  Administered 2018-02-26 – 2018-02-27 (×4): 50 mg via ORAL
  Filled 2018-02-25 (×5): qty 1

## 2018-02-25 MED ORDER — INSULIN GLARGINE 100 UNIT/ML ~~LOC~~ SOLN
25.0000 [IU] | Freq: Two times a day (BID) | SUBCUTANEOUS | Status: DC
  Filled 2018-02-25 (×2): qty 0.25

## 2018-02-25 MED ORDER — VANCOMYCIN HCL IN DEXTROSE 1-5 GM/200ML-% IV SOLN
1000.0000 mg | INTRAVENOUS | Status: DC
  Filled 2018-02-25: qty 200

## 2018-02-25 MED ORDER — SODIUM CHLORIDE 0.9 % IV SOLN
2.0000 g | INTRAVENOUS | Status: DC
  Administered 2018-02-25 – 2018-02-28 (×4): 2 g via INTRAVENOUS
  Filled 2018-02-25 (×6): qty 20

## 2018-02-25 MED ORDER — INSULIN DETEMIR 100 UNIT/ML ~~LOC~~ SOLN
25.0000 [IU] | Freq: Two times a day (BID) | SUBCUTANEOUS | Status: DC
  Administered 2018-02-25 – 2018-02-27 (×5): 25 [IU] via SUBCUTANEOUS
  Filled 2018-02-25 (×7): qty 0.25

## 2018-02-25 MED ORDER — ACETAMINOPHEN 650 MG RE SUPP: 650.0000 mg | Freq: Four times a day (QID) | RECTAL | Status: DC | PRN

## 2018-02-25 MED ORDER — CALCITRIOL 0.25 MCG PO CAPS
0.2500 ug | ORAL_CAPSULE | Freq: Every day | ORAL | Status: DC
  Administered 2018-02-25 – 2018-03-11 (×13): 0.25 ug via ORAL
  Filled 2018-02-25 (×14): qty 1

## 2018-02-25 MED ORDER — SENNA 8.6 MG PO TABS
1.0000 | ORAL_TABLET | Freq: Two times a day (BID) | ORAL | Status: DC
  Administered 2018-02-25 – 2018-03-02 (×8): 8.6 mg via ORAL
  Filled 2018-02-25 (×8): qty 1

## 2018-02-25 MED ORDER — VANCOMYCIN HCL 10 G IV SOLR
2000.0000 mg | Freq: Once | INTRAVENOUS | Status: AC
  Administered 2018-02-25: 2000 mg via INTRAVENOUS
  Filled 2018-02-25: qty 2000

## 2018-02-25 MED ORDER — INSULIN ASPART 100 UNIT/ML ~~LOC~~ SOLN
0.0000 [IU] | Freq: Three times a day (TID) | SUBCUTANEOUS | Status: DC
  Administered 2018-02-26 – 2018-02-27 (×4): 2 [IU] via SUBCUTANEOUS

## 2018-02-25 MED ORDER — OXYCODONE HCL 5 MG PO TABS
5.0000 mg | ORAL_TABLET | ORAL | Status: DC | PRN
  Administered 2018-02-25: 5 mg via ORAL
  Filled 2018-02-25: qty 1

## 2018-02-25 MED ORDER — CEFAZOLIN SODIUM-DEXTROSE 1-4 GM/50ML-% IV SOLN
1.0000 g | Freq: Once | INTRAVENOUS | Status: AC
  Administered 2018-02-25: 1 g via INTRAVENOUS
  Filled 2018-02-25: qty 50

## 2018-02-25 MED ORDER — GUAIFENESIN-DM 100-10 MG/5ML PO SYRP
5.0000 mL | ORAL_SOLUTION | ORAL | Status: DC | PRN
  Administered 2018-02-25 – 2018-02-26 (×2): 5 mL via ORAL
  Filled 2018-02-25 (×3): qty 5

## 2018-02-25 MED ORDER — VANCOMYCIN HCL 500 MG IV SOLR
500.0000 mg | Freq: Once | INTRAVENOUS | Status: DC
  Filled 2018-02-25: qty 500

## 2018-02-25 MED ORDER — ATORVASTATIN CALCIUM 80 MG PO TABS
80.0000 mg | ORAL_TABLET | Freq: Every day | ORAL | Status: DC
  Administered 2018-02-25 – 2018-02-28 (×3): 80 mg via ORAL
  Filled 2018-02-25 (×3): qty 1

## 2018-02-25 MED ORDER — DOCUSATE SODIUM 100 MG PO CAPS
100.0000 mg | ORAL_CAPSULE | Freq: Two times a day (BID) | ORAL | Status: DC
  Administered 2018-02-25 – 2018-02-26 (×3): 100 mg via ORAL
  Filled 2018-02-25 (×3): qty 1

## 2018-02-25 MED ORDER — CALCIUM ACETATE (PHOS BINDER) 667 MG PO CAPS: 667.0000 mg | ORAL_CAPSULE | Freq: Three times a day (TID) | ORAL | Status: DC

## 2018-02-25 MED ORDER — SODIUM CHLORIDE 0.9% FLUSH
3.0000 mL | Freq: Once | INTRAVENOUS | Status: AC
  Administered 2018-02-25: 3 mL via INTRAVENOUS

## 2018-02-25 MED ORDER — CARVEDILOL 12.5 MG PO TABS
25.0000 mg | ORAL_TABLET | Freq: Two times a day (BID) | ORAL | Status: DC
  Administered 2018-02-26 – 2018-02-27 (×4): 25 mg via ORAL
  Filled 2018-02-25 (×4): qty 1

## 2018-02-25 MED ORDER — ACETAMINOPHEN 325 MG PO TABS
650.0000 mg | ORAL_TABLET | Freq: Four times a day (QID) | ORAL | Status: DC | PRN
  Administered 2018-02-26: 650 mg via ORAL
  Filled 2018-02-25: qty 2

## 2018-02-25 NOTE — ED Provider Notes (Signed)
[facility] EMERGENCY DEPARTMENT Provider Note   CSN: [id number] Arrival date & time: 02/25/18  1030     History   Chief Complaint Chief Complaint  Patient presents with  . Toe Pain    HPI Rachel Chaney is a 58 y.o. female with PMH/o CHF, CKD (M,W, F dialysis), DM, HLD, HTN who presents for evaluation of pain, warmth, swelling to right second toe after injuring approximate 4 days ago.  Patient reports that she hit it on the side of the bed about 4 days ago and states that she took off some of the skin.  She reports that since then, she has had increasing pain, swelling, warmth of the foot.  She states that she has not been able to walk on it much due to pain.  Patient states she has not noted any fever.  She does states that she has noticed some purulent drainage from the toe.  She does report history of ESRD and states she did not have dialysis today.  Patient also history of diabetes that is controlled with insulin.  She reports her blood sugars been in the 200s recently which is elevated from her normal 100s-150s.  Patient denies any fevers, chest pain, difficulty breathing, numbness/weakness.  The history is provided by the patient.    Past Medical History:  Diagnosis Date  . CHF (congestive heart failure) ([place])   . Chronic kidney disease   . Diabetes mellitus   . Hyperlipidemia   . Hypertension     Patient Active Problem List   Diagnosis Date Noted  . Seasonal allergies 04/12/2017  . Post-menopausal bleeding 12/07/2015  . ESRD on dialysis ([place]) 05/25/2015  . Health care maintenance 02/18/2015  . Hypoalbuminemia   . Healthcare maintenance 03/02/2014  . Chronic kidney disease due to diabetes mellitus ([place]) 10/13/2013  . Abnormal appearance of cervix 08/08/2011  . GERD (gastroesophageal reflux disease) 04/27/2010  . Diabetes mellitus with neuropathy ([place]) 04/02/2006  . Hyperlipidemia 03/08/2006  . OBESITY, NOS 03/08/2006  . Essential hypertension  03/08/2006    Past Surgical History:  Procedure Laterality Date  . AV FISTULA PLACEMENT Left 04/15/2015   Procedure: ARTERIOVENOUS (AV) FISTULA CREATION;  Surgeon: Mal Misty, MD;  Location: [facility];  Service: Vascular;  Laterality: Left;  . EYE SURGERY Right    retinal detachment  . FISTULA SUPERFICIALIZATION Left 06/03/2015   Procedure: LEFT UPPER ARM BRACHIOCEPHALIC FISTULA SUPERFICIALIZATION;  Surgeon: Mal Misty, MD;  Location: [facility];  Service: Vascular;  Laterality: Left;  from MAC to General     OB History   No obstetric history on file.      Home Medications    Prior to Admission medications   Medication Sig Start Date End Date Taking? Authorizing Provider  acetaminophen (TYLENOL) 500 MG tablet Take 1,000 mg by mouth every 6 (six) hours as needed for pain.   Yes [provider]  atorvastatin (LIPITOR) 80 MG tablet Take 1 tablet (80 mg total) by mouth daily. 10/17/17  Yes Orson Eva J, DO  calcitRIOL (ROCALTROL) 0.25 MCG capsule TAKE 1 CAPSULE (0.25 MCG TOTAL) BY MOUTH DAILY. Patient taking differently: Take 0.25 mcg by mouth daily.  02/19/15  Yes Haney, Alyssa A, MD  carvedilol (COREG) 25 MG tablet Take 1 tablet (25 mg total) by mouth 2 (two) times daily with a meal. 10/17/17  Yes Orson Eva J, DO  cyclobenzaprine (FLEXERIL) 10 MG tablet Take 1 tablet (10 mg total) by mouth at bedtime. 05/21/17  Yes Mercy Riding, MD  hydrALAZINE (APRESOLINE) 50 MG tablet Take 1 tablet (50 mg total) by mouth 3 (three) times daily. Patient taking differently: Take 50 mg by mouth 2 (two) times daily.  10/17/17  Yes Yoo, Elsia J, DO  LEVEMIR FLEXTOUCH 100 UNIT/ML Pen ADMINISTER 50 UNITS UNDER THE SKIN TWICE DAILY Patient taking differently: Inject 50 Units into the skin 2 (two) times daily.  01/07/18  Yes [place] Bing, DO  multivitamin (RENA-VIT) TABS tablet Take 1 tablet by mouth at bedtime. 07/20/15  Yes Haney, Alyssa A, MD  Blood Glucose Monitoring Suppl (ONE TOUCH ULTRA 2)  w/Device KIT 1 kit by Does not apply route 3 (three) times daily. ICD-10 code: E11.40 08/23/16   Dickie La, MD  calcium acetate (PHOSLO) 667 MG capsule Take 1 capsule (667 mg total) by mouth 3 (three) times daily with meals. 04/19/15   Verner Mould, MD  Darbepoetin Alfa (ARANESP) 25 MCG/0.42ML SOSY injection Inject 0.42 mLs (25 mcg total) into the vein every Monday with hemodialysis. 04/19/15   Verner Mould, MD  fluticasone [facility]) 50 MCG/ACT nasal spray Place 1 spray into both nostrils daily. 1 spray in each nostril every day Patient not taking: Reported on 02/25/2018 04/12/17   Bufford Lope, DO  glucose blood (ONE TOUCH ULTRA TEST) test strip 1 each by Other route 3 (three) times daily. ICD-10 code: E11.40. 08/23/16   Dickie La, MD  glucose monitoring kit (FREESTYLE) monitoring kit 1 each by Does not apply route as needed for other. 11/09/16   Bufford Lope, DO  Insulin Pen Needle (B-D UF III MINI PEN NEEDLES) 31G X 5 MM MISC USE AS DIRECTED FOR INSULIN INJECTION TWICE DAILY 03/09/17   Bufford Lope, DO  Insulin Pen Needle 29G X 12MM MISC Inject 1 pen into the skin 2 (two) times daily. Check blood sugar daily. 04/29/13   Waldemar Dickens, MD  [facility] DELICA LANCETS 06T MISC 1 Device by Does not apply route as needed (to check blood glucose). 03/09/17   Bufford Lope, DO  ULTICARE INSULIN SYRINGE 30G X 5/16" 1 ML MISC USE AS DIRECTED 02/02/16   Haney, Alyssa A, MD  progesterone (PROMETRIUM) 200 MG capsule Take 1 capsule (200 mg total) by mouth daily. Take for 10 days if bleeding occurs for more than 4 days. 11/09/10 03/28/11  Lyndal Pulley, DO    Family History Family History  Problem Relation Age of Onset  . Heart disease Mother   . Heart disease Father     Social History Social History   Tobacco Use  . Smoking status: Never Smoker  . Smokeless tobacco: Never Used  Substance Use Topics  . Alcohol use: No    Alcohol/week: 0.0 standard drinks  . Drug use: No      Allergies   Patient has no known allergies.   Review of Systems Review of Systems  Cardiovascular: Negative for chest pain.  Skin: Positive for color change and wound.  All other systems reviewed and are negative.    Physical Exam Updated Vital Signs BP (!) 149/60 (BP Location: Right Arm)   Pulse 70   Temp 98.4 F (36.9 C) (Oral)   Resp 18   LMP 09/18/2011   SpO2 95%   Physical Exam Vitals signs and nursing note reviewed.  Constitutional:      Appearance: Normal appearance. She is well-developed.  HENT:     Head: Normocephalic and atraumatic.  Eyes:  General: Lids are normal.     Conjunctiva/sclera: Conjunctivae normal.     Pupils: Pupils are equal, round, and reactive to light.  Neck:     Musculoskeletal: Full passive range of motion without pain.  Cardiovascular:     Rate and Rhythm: Normal rate and regular rhythm.     Pulses: Normal pulses.          Radial pulses are 2+ on the right side and 2+ on the left side.       Dorsalis pedis pulses are 2+ on the right side and 2+ on the left side.     Heart sounds: Normal heart sounds. No murmur. No friction rub. No gallop.   Pulmonary:     Effort: Pulmonary effort is normal.     Breath sounds: Normal breath sounds.  Musculoskeletal: Normal range of motion.     Comments: There is palpation to the dorsal aspect of the right foot that extends up into the second, third metatarsal.  Second toe is edematous, erythematous and warm with obvious skin breakage, purulent drainage.  There appears to be an area on the lateral aspect of the second toe that appears open.  I am able to get about 3 cm of Q-tip all the way down in.  Suspect that this may be an open fracture.  Tenderness palpation noted to ankle, distal tib-fib.  No crepitus noted.  Skin:    General: Skin is warm and dry.     Capillary Refill: Capillary refill takes less than 2 seconds.     Comments: Skin breakdown noted around the right second toe.  Neurological:      Mental Status: She is alert and oriented to person, place, and time.  Psychiatric:        Speech: Speech normal.               ED Treatments / Results  Labs (all labs ordered are listed, but only abnormal results are displayed) Labs Reviewed  COMPREHENSIVE METABOLIC PANEL - Abnormal; Notable for the following components:      Result Value   Chloride 90 (*)    Glucose, Bld 69 (*)    BUN 44 (*)    Creatinine, Ser 8.98 (*)    Calcium 8.6 (*)    Albumin 2.6 (*)    GFR calc non Af Amer 4 (*)    GFR calc Af Amer 5 (*)    All other components within normal limits  CBC WITH DIFFERENTIAL/PLATELET - Abnormal; Notable for the following components:   WBC 27.0 (*)    RBC 3.58 (*)    Hemoglobin 11.1 (*)    MCV 103.1 (*)    Neutro Abs 22.7 (*)    Monocytes Absolute 1.8 (*)    Abs Immature Granulocytes 0.28 (*)    All other components within normal limits    EKG None  Radiology Dg Foot Complete Right  Result Date: 02/25/2018 CLINICAL DATA:  Toe injury. EXAM: RIGHT FOOT COMPLETE - 3+ VIEW COMPARISON:  Radiographs of May 14, 2012. FINDINGS: Moderately displaced fracture is seen involving the proximal portion of the first proximal phalanx with intra-articular extension with some callus formation suggesting subacute fracture. Minimally displaced fracture is seen involving the proximal portion of the second distal phalanx. Soft tissue irregularity or wound is seen involving the distal portion of the second toe. Mild posterior calcaneal spurring is noted. IMPRESSION: Probable subacute moderately displaced fracture is seen involving the first proximal phalanx with intra-articular extension. Probable minimally  displaced acute fracture is seen involving proximal portion of the second distal phalanx. Overlying soft tissue irregularity or wound is seen involving the distal soft tissues of the second toe. Electronically Signed   By: Marijo Conception, M.D.   On: 02/25/2018 12:17     Procedures Procedures (including critical care time)  Medications Ordered in ED Medications  sodium chloride flush (NS) 0.9 % injection 3 mL (has no administration in time range)  cefTRIAXone (ROCEPHIN) 2 g in sodium chloride 0.9 % 100 mL IVPB (has no administration in time range)  vancomycin (VANCOCIN) 500 mg in sodium chloride 0.9 % 100 mL IVPB (has no administration in time range)  ceFAZolin (ANCEF) IVPB 1 g/50 mL premix (0 g Intravenous Stopped 02/25/18 1529)     Initial Impression / Assessment and Plan / ED Course  I have reviewed the triage vital signs and the nursing notes.  Pertinent labs & imaging results that were available during my care of the patient were reviewed by me and considered in my medical decision making (see chart for details).    58 year old female past medical history of hypertension, diabetes, CKD who presents for evaluation of pain, warmth, swelling noted to right second toe.  She reports she hit it on a dresser a few days ago and states that since then, she has had worsening pain, swelling.  No fevers noted. Patient is afebrile, non-toxic appearing, sitting comfortably on examination table. Vital signs reviewed and stable. Patient is neurovascularly intact.  On exam, second toe is edematous, warm, erythematous with purulent drainage noted.  There is an area on the lateral aspect that appears to be open and I can get almost 3 cm a Q-tip in.  Question if this is an open fracture that is now become infected versus diabetic wound infection vs osteo.  Initial labs ordered at triage.  CBC shows leukocytosis of 27.0.  Hemoglobin 11.1.  CMP shows bicarb of 30, potassium 3.7.  BUN is 44, creatinine is 8.98.  X-ray shows probable subacute moderately displaced fracture involving the first proximal phalanx within the intra-articular extension.  Additionally, there is a minimally displaced acute fracture involving the proximal portion of the second distal phalanx.  There is  some overlying soft tissue irregularity noted.  Antibiotics initiated.  We will plan to consult Ortho.  Will likely need surgical washout.  Discussed patient with Hilbert Odor (Ortho PA). Agrees with plan for surgical intervention. Given patient's medical history, recommends medical admission. Patient can eat as surgery will likely be tomorrow or Wednesday. Will start patient on abx for coverage for osteo.   Discussed patient with Family Medicine. Will plan for admission.   Final Clinical Impressions(s) / ED Diagnoses   Final diagnoses:  Toe infection    ED Discharge Orders    None       Desma Mcgregor 02/25/18 1611    Sherwood Gambler, MD 03/01/18 [phone]

## 2018-02-25 NOTE — Progress Notes (Signed)
Pharmacy Antibiotic Note  Rachel Chaney is a 58 y.o. female admitted on 02/25/2018 with an infected toe.  Pharmacy has been consulted for vancomycin dosing.  ESRD HD- MWF, missed last HD session.    Plan: Vancomycin 2000mg  Iv x 1, then 1000mg  IV with HD Monitor HD schedule, ortho recommendations for possible intervention Vancomycin level as needed    Temp (24hrs), Avg:98.4 F (36.9 C), Min:98.4 F (36.9 C), Max:98.4 F (36.9 C)  Recent Labs  Lab 02/25/18 1105  WBC 27.0*  CREATININE 8.98*    CrCl cannot be calculated (Unknown ideal weight.).    No Known Allergies  Antimicrobials this admission: Ceftriaxone 2/17>> Vancomycin 2/17>> Ancef x 1 in ED  Dose adjustments this admission: n/a  Microbiology results:   Bertis Ruddy, PharmD Clinical Pharmacist Please check AMION for all [facility] numbers 02/25/2018 3:48 PM

## 2018-02-25 NOTE — Progress Notes (Signed)
Pt admitted to the unit from ED; pt A&O x4; left arm fistula + bruit and thrill; pt oriented the unit and room; fall/safety precaution and prevention education completed; pt voices understanding and denies any questions. Pt right toe wound opened to air; non-pitting edema to RLE; ice applied to RLE as ordered; pt skin clean, dry and intact with no pressure ulcers or opened wounds noted except for RLE toe; pt in bed with call light within reach; bed alarm on and reported off to oncoming RN. Delia Heady RN   02/25/18 1804  Vitals  Temp 98.9 F (37.2 C)  Temp Source Oral  BP 131/61  MAP (mmHg) 80  BP Location Right Arm  BP Method Automatic  Patient Position (if appropriate) Lying  Pulse Rate 80  Pulse Rate Source Monitor  Resp 20  Oxygen Therapy  SpO2 98 %  O2 Device Room Air  MEWS Score  MEWS RR 0  MEWS Pulse 0  MEWS Systolic 0  MEWS LOC 0  MEWS Temp 0  MEWS Score 0  MEWS Score Color Green

## 2018-02-25 NOTE — H&P (Addendum)
[facility] Admission History and Physical Service Pager: [phone]  Patient name: Rachel Chaney Medical record number: [id number] Date of birth: 1960-03-05 Age: 58 y.o. Gender: female  Primary Care Provider: Bufford Lope, DO Consultants: Margaretmary Lombard Code Status: Full code  Chief Complaint: Painful toe  Assessment and Plan: Rachel Chaney is a 58 y.o. female presenting with painful 2nd toe of R foot. PMH is significant for ESRD on HD MWF, T2DM, HTN, GERD, CHF, and dyslipidemia.  Right second toe fracture OPEN, soft tissue infection, concerning for osteomyelitis: Patient reports toe injury 3 to 4 days ago after hitting her toe against her bed and breaking the skin.  At the time of injury patient reports seeing blood in broken skin but most likely did not realize severity of the injury damage due to severe neuropathy. WBC count 27 this admission.  Patient was given 1 g Ancef in the ED. X-ray of second toe on right foot showing probable subacute, moderately displaced fracture of the first proximal phalanx and minimally displaced acute fracture of proximal portion of second distal phalanx.  Physical exam reveals erythematous, edematous large ulcer draining necrotic, purulent material. Gustilio-Anderson open fracture scale suggesting Type III fracture warranting broad spectrum antibiotics.  Infection appears to be localized to the second toe.  No signs of systemic infection given patient is afebrile with stable vitals and stable clinical picture. Revised Cardiac Risk Index for Pre-Operative Risk 2. Plan to optimize HTN and DM prior to surgery. Pt is already on statin.  -Admit to med surg, attending Dr. Wendy Poet -Ortho consulted in ED - plan to amputate second toe Tuesday or Wednesday, appreciate recs -Discussed with pharmacy - CTX 2 g daily, Vanc 2g daily, only 1 g with dialysis.; consider broadening to Metronidazole should symptoms worsen -Pain control with  Tylenol and oxycodone, Senokot and Colace on board to decrease constipation -ambulate with assistance -Vitals per routine -c/s PT after surgery  End-stage renal disease on HD: Receives hemodialysis MWF at [facility], last HD on Friday, missed session today 2/17.  Is still producing urine. - Contact Nephro for HD - Renal/carb modified diet  T2DM: Last HbA1c 9.1% 8/15.  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  She denies vision loss and polydipsia.  She reports retinopathy, polyphagia, and polyuria.  Patient reports CBGs between 120-140.  Takes 50 units Levemir twice daily. -Continue Levemir 25 units twice daily -Follow-up HbA1c -Moderate sliding scale insulin times daily -CBG monitoring 4 times daily  HTN: Normotensive on admission. Blood pressure on admission 131/61.  Patient reports taking hydralazine and carvedilol.  She reports taking both these medications last night. -Continue home dose hydralazine 50 mg and carvedilol 25 mg -Appreciate nephrology recommendations  Hyperlipidemia: Patient taking atorvastatin 80 mg daily, last took medicine last night 2/16.  Most recent lipid panel April 2019 showing low cholesterol at 94, low HDL at 32, and other values within normal limits. -Continue atorvastatin 80 mg  GERD: Patient asymptomatic at this time, usually takes Tums at home. -Monitor for acid reflux symptoms.  FEN/GI: Renal/carb modified diet Prophylaxis: Heparin  Disposition: Admit to med-surg  History of Present Illness:  Rachel Chaney is a 58 y.o. female presenting with progressively worsening toe soreness and swelling. She reports she thinks she hit her toe on the side of her bed 3 to 4 days ago. At the time she felt no pain, only found blood on her toe and thought she had just broken the skin.  She kept the toe clean with Neosporin topical and peroxide. She was otherwise able to ambulate without issue. The toes slowly began to swell, become sore,  and started draining purulent material. Ambulating became more difficult. She had a doctor's appointment scheduled with the clinic tomorrow 02/26/2018 but decided come to the emergency department instead.   Review Of Systems: Per HPI with the following additions: The patient denies any fevers, chills, vision changes, polydipsia, nausea, vomiting, chest pain, dyspnea.  She reports chronic retinopathy, polyphagia, polyuria, and/tingling in her hands and feet.  She reports feeling cold in the emergency department.  Patient Active Problem List   Diagnosis Date Noted  . Open toe fracture 02/25/2018  . Seasonal allergies 04/12/2017  . Post-menopausal bleeding 12/07/2015  . ESRD on dialysis ([place]) 05/25/2015  . Health care maintenance 02/18/2015  . Hypoalbuminemia   . Healthcare maintenance 03/02/2014  . Chronic kidney disease due to diabetes mellitus ([place]) 10/13/2013  . Abnormal appearance of cervix 08/08/2011  . GERD (gastroesophageal reflux disease) 04/27/2010  . Diabetes mellitus with neuropathy ([place]) 04/02/2006  . Hyperlipidemia 03/08/2006  . OBESITY, NOS 03/08/2006  . Essential hypertension 03/08/2006    Past Medical History: Past Medical History:  Diagnosis Date  . CHF (congestive heart failure) ([place])   . Chronic kidney disease   . Diabetes mellitus   . Hyperlipidemia   . Hypertension     Past Surgical History: Past Surgical History:  Procedure Laterality Date  . AV FISTULA PLACEMENT Left 04/15/2015   Procedure: ARTERIOVENOUS (AV) FISTULA CREATION;  Surgeon: Mal Misty, MD;  Location: [place];  Service: Vascular;  Laterality: Left;  . EYE SURGERY Right    retinal detachment  . FISTULA SUPERFICIALIZATION Left 06/03/2015   Procedure: LEFT UPPER ARM BRACHIOCEPHALIC FISTULA SUPERFICIALIZATION;  Surgeon: Mal Misty, MD;  Location: [facility] OR;  Service: Vascular;  Laterality: Left;  from MAC to General    Social History: Social History   Tobacco Use  . Smoking status: Never  Smoker  . Smokeless tobacco: Never Used  Substance Use Topics  . Alcohol use: No    Alcohol/week: 0.0 standard drinks  . Drug use: No   Additional social history: None Please also refer to relevant sections of EMR.  Family History: Family History  Problem Relation Age of Onset  . Heart disease Mother   . Heart disease Father    Allergies and Medications: No Known Allergies -confirmed No current facility-administered medications on file prior to encounter.    Current Outpatient Medications on File Prior to Encounter  Medication Sig Dispense Refill  . acetaminophen (TYLENOL) 500 MG tablet Take 1,000 mg by mouth every 6 (six) hours as needed for pain.    . carvedilol (COREG) 25 MG tablet Take 1 tablet (25 mg total) by mouth 2 (two) times daily with a meal. 180 tablet 3  . cyclobenzaprine (FLEXERIL) 10 MG tablet Take 1 tablet (10 mg total) by mouth at bedtime. 20 tablet 0  . ferric citrate (AURYXIA) 1 GM 210 MG(Fe) tablet Take 420 mg by mouth 3 (three) times daily with meals.    . hydrALAZINE (APRESOLINE) 50 MG tablet Take 1 tablet (50 mg total) by mouth 3 (three) times daily. (Patient taking differently: Take 50 mg by mouth 2 (two) times daily. ) 270 tablet 3  . hydrOXYzine (ATARAX/VISTARIL) 50 MG tablet Take 50 mg by mouth 2 (two) times daily as needed for itching.    Marland Kitchen LEVEMIR FLEXTOUCH 100 UNIT/ML Pen ADMINISTER 50 UNITS UNDER THE  SKIN TWICE DAILY (Patient taking differently: Inject 50 Units into the skin 2 (two) times daily. ) 15 mL 3  . multivitamin (RENA-VIT) TABS tablet Take 1 tablet by mouth at bedtime. 30 tablet 10  . atorvastatin (LIPITOR) 80 MG tablet Take 1 tablet (80 mg total) by mouth daily. 90 tablet 3  . Blood Glucose Monitoring Suppl (ONE TOUCH ULTRA 2) w/Device KIT 1 kit by Does not apply route 3 (three) times daily. ICD-10 code: E11.40 1 each 0  . calcitRIOL (ROCALTROL) 0.25 MCG capsule TAKE 1 CAPSULE (0.25 MCG TOTAL) BY MOUTH DAILY. (Patient taking differently: Take  0.25 mcg by mouth daily. ) 30 capsule 3  . calcium acetate (PHOSLO) 667 MG capsule Take 1 capsule (667 mg total) by mouth 3 (three) times daily with meals. (Patient not taking: Reported on 02/25/2018) 90 capsule 0  . Darbepoetin Alfa (ARANESP) 25 MCG/0.42ML SOSY injection Inject 0.42 mLs (25 mcg total) into the vein every Monday with hemodialysis. (Patient not taking: Reported on 02/25/2018) 14.28 mL 0  . fluticasone (FLONASE) 50 MCG/ACT nasal spray Place 1 spray into both nostrils daily. 1 spray in each nostril every day (Patient not taking: Reported on 02/25/2018) 16 g 12  . glucose blood (ONE TOUCH ULTRA TEST) test strip 1 each by Other route 3 (three) times daily. ICD-10 code: E11.40. 100 each 5  . glucose monitoring kit (FREESTYLE) monitoring kit 1 each by Does not apply route as needed for other. 1 each 0  . Insulin Pen Needle (B-D UF III MINI PEN NEEDLES) 31G X 5 MM MISC USE AS DIRECTED FOR INSULIN INJECTION TWICE DAILY 200 each 3  . Insulin Pen Needle 29G X 12MM MISC Inject 1 pen into the skin 2 (two) times daily. Check blood sugar daily. 100 each 6  . ONETOUCH DELICA LANCETS 99B MISC 1 Device by Does not apply route as needed (to check blood glucose). 100 each 3  . ULTICARE INSULIN SYRINGE 30G X 5/16" 1 ML MISC USE AS DIRECTED 100 each 0  . [DISCONTINUED] progesterone (PROMETRIUM) 200 MG capsule Take 1 capsule (200 mg total) by mouth daily. Take for 10 days if bleeding occurs for more than 4 days. 10 capsule 1   Objective: BP 131/61 (BP Location: Right Arm)   Pulse 80   Temp 98.9 F (37.2 C) (Oral)   Resp 20   Ht 5' 4"  (1.626 m)   Wt 104.3 kg   LMP 09/18/2011   SpO2 98%   BMI 39.48 kg/m   Physical Exam Constitutional:      General: She is not in acute distress.    Appearance: She is obese. She is not toxic-appearing.  Cardiovascular:     Rate and Rhythm: Normal rate and regular rhythm.  Pulmonary:     Effort: Pulmonary effort is normal.     Breath sounds: Normal breath sounds.   Musculoskeletal:        General: Signs of injury (2nd toe, R foot: Edematous, erythematous, large distal ulceration w/ purulent/necrotic discharge (see photo)) present.  Neurological:     Mental Status: She is oriented to person, place, and time.     Sensory: Sensory deficit (To bilateral lower extremities 2/2 neuropathy) present.  Psychiatric:        Mood and Affect: Mood normal.        Behavior: Behavior normal.      Labs and Imaging: CBC BMET  Recent Labs  Lab 02/25/18 1105  WBC 27.0*  HGB 11.1*  HCT 36.9  PLT 247   Recent Labs  Lab 02/25/18 1105  NA 135  K 3.7  CL 90*  CO2 30  BUN 44*  CREATININE 8.98*  GLUCOSE 69*  CALCIUM 8.6*     HbA1c pending  Bonnita Hollow, MD 02/25/2018, 7:41 PM   RESIDENT ATTESTATION   I have seen and examined this patient.    I have discussed the findings and exam with the intern and agree with the above note, which I have edited appropriately in [place]. I helped develop the management plan that is described in the resident's note, and I agree with the content.   Bonnita Hollow, MD  PGY-2, [place] Intern pager: [phone], text pages welcome

## 2018-02-25 NOTE — Consult Note (Signed)
Reason for Consult:Right 2nd toe infection Referring Physician: Deboraha Sprang [name] is an 59 y.o. female.  HPI: Rachel Chaney comes in with a history of right 2nd toe infection. She states she hit it on the bed on Thursday or Friday. Over the weekend it swelled up dramatically, her pain increased, and it began to drain. She denies fevers, chills, sweats, N/V or prior problems with her feet/toes.  Past Medical History:  Diagnosis Date  . CHF (congestive heart failure) ([place])   . Chronic kidney disease   . Diabetes mellitus   . Hyperlipidemia   . Hypertension     Past Surgical History:  Procedure Laterality Date  . AV FISTULA PLACEMENT Left 04/15/2015   Procedure: ARTERIOVENOUS (AV) FISTULA CREATION;  Surgeon: Mal Misty, MD;  Location: [place];  Service: Vascular;  Laterality: Left;  . EYE SURGERY Right    retinal detachment  . FISTULA SUPERFICIALIZATION Left 06/03/2015   Procedure: LEFT UPPER ARM BRACHIOCEPHALIC FISTULA SUPERFICIALIZATION;  Surgeon: Mal Misty, MD;  Location: [place];  Service: Vascular;  Laterality: Left;  from MAC to General    Family History  Problem Relation Age of Onset  . Heart disease Mother   . Heart disease Father     Social History:  reports that she has never smoked. She has never used smokeless tobacco. She reports that she does not drink alcohol or use drugs.  Allergies: No Known Allergies  Medications: I have reviewed the patient's current medications.  Results for orders placed or performed during the hospital encounter of 02/25/18 (from the past 48 hour(s))  Comprehensive metabolic panel     Status: Abnormal   Collection Time: 02/25/18 11:05 AM  Result Value Ref Range   Sodium 135 135 - 145 mmol/L   Potassium 3.7 3.5 - 5.1 mmol/L   Chloride 90 (L) 98 - 111 mmol/L   CO2 30 22 - 32 mmol/L   Glucose, Bld 69 (L) 70 - 99 mg/dL   BUN 44 (H) 6 - 20 mg/dL   Creatinine, Ser 8.98 (H) 0.44 - 1.00 mg/dL   Calcium 8.6 (L) 8.9 - 10.3 mg/dL   Total Protein 6.9 6.5 - 8.1 g/dL   Albumin 2.6 (L) 3.5 - 5.0 g/dL   AST 15 15 - 41 U/L   ALT 18 0 - 44 U/L   Alkaline Phosphatase 85 38 - 126 U/L   Total Bilirubin 0.6 0.3 - 1.2 mg/dL   GFR calc non Af Amer 4 (L) >60 mL/min   GFR calc Af Amer 5 (L) >60 mL/min   Anion gap 15 5 - 15    Comment: Performed at [facility], [address] [place], [place] [address]  CBC with Differential     Status: Abnormal   Collection Time: 02/25/18 11:05 AM  Result Value Ref Range   WBC 27.0 (H) 4.0 - 10.5 K/uL    Comment: REPEATED TO VERIFY   RBC 3.58 (L) 3.87 - 5.11 MIL/uL   Hemoglobin 11.1 (L) 12.0 - 15.0 g/dL   HCT 36.9 36.0 - 46.0 %   MCV 103.1 (H) 80.0 - 100.0 fL   MCH 31.0 26.0 - 34.0 pg   MCHC 30.1 30.0 - 36.0 g/dL   RDW 15.0 11.5 - 15.5 %   Platelets 247 150 - 400 K/uL   nRBC 0.0 0.0 - 0.2 %   Neutrophils Relative % 83 %   Neutro Abs 22.7 (H) 1.7 - 7.7 K/uL   Lymphocytes Relative  8 %   Lymphs Abs 2.1 0.7 - 4.0 K/uL   Monocytes Relative 7 %   Monocytes Absolute 1.8 (H) 0.1 - 1.0 K/uL   Eosinophils Relative 1 %   Eosinophils Absolute 0.2 0.0 - 0.5 K/uL   Basophils Relative 0 %   Basophils Absolute 0.1 0.0 - 0.1 K/uL   WBC Morphology MORPHOLOGY UNREMARKABLE    Immature Granulocytes 1 %   Abs Immature Granulocytes 0.28 (H) 0.00 - 0.07 K/uL    Comment: Performed at [facility] [address] [place],  [address]    Dg Foot Complete Right  Result Date: 02/25/2018 CLINICAL DATA:  Toe injury. EXAM: RIGHT FOOT COMPLETE - 3+ VIEW COMPARISON:  Radiographs of May 14, 2012. FINDINGS: Moderately displaced fracture is seen involving the proximal portion of the first proximal phalanx with intra-articular extension with some callus formation suggesting subacute fracture. Minimally displaced fracture is seen involving the proximal portion of the second distal phalanx. Soft tissue irregularity or wound is seen involving the distal portion of the second toe. Mild posterior calcaneal  spurring is noted. IMPRESSION: Probable subacute moderately displaced fracture is seen involving the first proximal phalanx with intra-articular extension. Probable minimally displaced acute fracture is seen involving proximal portion of the second distal phalanx. Overlying soft tissue irregularity or wound is seen involving the distal soft tissues of the second toe. Electronically Signed   By: Marijo Conception, M.D.   On: 02/25/2018 12:17    Review of Systems  Constitutional: Negative for chills, fever and weight loss.  HENT: Negative for ear discharge, ear pain, hearing loss and tinnitus.   Eyes: Negative for blurred vision, double vision, photophobia and pain.  Respiratory: Negative for cough, sputum production and shortness of breath.   Cardiovascular: Negative for chest pain.  Gastrointestinal: Negative for abdominal pain, nausea and vomiting.  Genitourinary: Negative for dysuria, flank pain, frequency and urgency.  Musculoskeletal: Positive for joint pain (Right foot). Negative for back pain, falls, myalgias and neck pain.  Neurological: Negative for dizziness, tingling, sensory change, focal weakness, loss of consciousness and headaches.  Endo/Heme/Allergies: Does not bruise/bleed easily.  Psychiatric/Behavioral: Negative for depression, memory loss and substance abuse. The patient is not nervous/anxious.    Blood pressure (!) 164/76, pulse 79, temperature 98.4 F (36.9 C), temperature source Oral, resp. rate 20, last menstrual period 09/18/2011, SpO2 91 %. Physical Exam  Constitutional: She appears well-developed and well-nourished. No distress.  HENT:  Head: Normocephalic and atraumatic.  Eyes: Conjunctivae are normal. Right eye exhibits no discharge. Left eye exhibits no discharge. No scleral icterus.  Neck: Normal range of motion.  Cardiovascular: Normal rate and regular rhythm.  Respiratory: Effort normal. No respiratory distress.  Musculoskeletal:     Comments: RLE No traumatic  wounds, ecchymosis, or rash  2nd toe fusiform edema, large ulceration distally with purulent and necrotic discharge  No knee or ankle effusion  Knee stable to varus/ valgus and anterior/posterior stress  Sens DPN, SPN, TN intact  Motor EHL, ext, flex, evers 5/5  DP 0, PT 0, (Good biphasic doppler signal for both) Mild edema with dorsal erythema  Neurological: She is alert.  Skin: Skin is warm and dry. She is not diaphoretic.  Psychiatric: She has a normal mood and affect. Her behavior is normal.    Assessment/Plan: Right 2nd toe infection with possible distal phalanx fx -- Will likely need amputation. Dr. Sharol Given to evaluate. She may eat as surgery will probably be Wednesday. Multiple medical problems including  ESRD on HD, DM, HLD, and HTN -- IM to admit, appreciate their assistance    Lisette Abu, PA-C Orthopedic Surgery [phone] 02/25/2018, 2:55 PM

## 2018-02-25 NOTE — ED Triage Notes (Signed)
Pt has infection to right second toe. Reports she hit it 3-4 days ago. Swelling and pus drainage noted from the toe.

## 2018-02-26 ENCOUNTER — Ambulatory Visit (INDEPENDENT_AMBULATORY_CARE_PROVIDER_SITE_OTHER): Payer: Self-pay | Admitting: Physician Assistant

## 2018-02-26 ENCOUNTER — Ambulatory Visit: Payer: Self-pay | Admitting: Family Medicine

## 2018-02-26 ENCOUNTER — Other Ambulatory Visit: Payer: Self-pay

## 2018-02-26 ENCOUNTER — Encounter (HOSPITAL_COMMUNITY): Payer: Self-pay | Admitting: Family Medicine

## 2018-02-26 DIAGNOSIS — IMO0002 Reserved for concepts with insufficient information to code with codable children: Secondary | ICD-10-CM | POA: Diagnosis present

## 2018-02-26 DIAGNOSIS — I10 Essential (primary) hypertension: Secondary | ICD-10-CM

## 2018-02-26 DIAGNOSIS — M869 Osteomyelitis, unspecified: Secondary | ICD-10-CM

## 2018-02-26 DIAGNOSIS — E1165 Type 2 diabetes mellitus with hyperglycemia: Secondary | ICD-10-CM | POA: Diagnosis present

## 2018-02-26 DIAGNOSIS — E1142 Type 2 diabetes mellitus with diabetic polyneuropathy: Secondary | ICD-10-CM | POA: Diagnosis present

## 2018-02-26 DIAGNOSIS — N186 End stage renal disease: Secondary | ICD-10-CM

## 2018-02-26 DIAGNOSIS — E43 Unspecified severe protein-calorie malnutrition: Secondary | ICD-10-CM | POA: Diagnosis present

## 2018-02-26 DIAGNOSIS — L089 Local infection of the skin and subcutaneous tissue, unspecified: Secondary | ICD-10-CM | POA: Insufficient documentation

## 2018-02-26 DIAGNOSIS — E1140 Type 2 diabetes mellitus with diabetic neuropathy, unspecified: Secondary | ICD-10-CM

## 2018-02-26 DIAGNOSIS — S92514B Nondisplaced fracture of proximal phalanx of right lesser toe(s), initial encounter for open fracture: Secondary | ICD-10-CM

## 2018-02-26 DIAGNOSIS — Z992 Dependence on renal dialysis: Secondary | ICD-10-CM

## 2018-02-26 DIAGNOSIS — L02611 Cutaneous abscess of right foot: Secondary | ICD-10-CM | POA: Diagnosis present

## 2018-02-26 LAB — BASIC METABOLIC PANEL
Anion gap: 16 — ABNORMAL HIGH (ref 5–15)
BUN: 51 mg/dL — ABNORMAL HIGH (ref 6–20)
CO2: 27 mmol/L (ref 22–32)
Calcium: 8.2 mg/dL — ABNORMAL LOW (ref 8.9–10.3)
Chloride: 90 mmol/L — ABNORMAL LOW (ref 98–111)
Creatinine, Ser: 9.62 mg/dL — ABNORMAL HIGH (ref 0.44–1.00)
GFR calc Af Amer: 5 mL/min — ABNORMAL LOW (ref >60)
GFR calc non Af Amer: 4 mL/min — ABNORMAL LOW (ref >60)
Glucose, Bld: 162 mg/dL — ABNORMAL HIGH (ref 70–99)
Potassium: 3.9 mmol/L (ref 3.5–5.1)
Sodium: 133 mmol/L — ABNORMAL LOW (ref 135–145)

## 2018-02-26 LAB — CBC
HCT: 35 % — ABNORMAL LOW (ref 36.0–46.0)
Hemoglobin: 10.8 g/dL — ABNORMAL LOW (ref 12.0–15.0)
MCH: 31.5 pg (ref 26.0–34.0)
MCHC: 30.9 g/dL (ref 30.0–36.0)
MCV: 102 fL — ABNORMAL HIGH (ref 80.0–100.0)
Platelets: 254 10*3/uL (ref 150–400)
RBC: 3.43 MIL/uL — ABNORMAL LOW (ref 3.87–5.11)
RDW: 15 % (ref 11.5–15.5)
WBC: 29.3 10*3/uL — ABNORMAL HIGH (ref 4.0–10.5)
nRBC: 0 % (ref 0.0–0.2)

## 2018-02-26 LAB — GLUCOSE, CAPILLARY
Glucose-Capillary: 141 mg/dL — ABNORMAL HIGH (ref 70–99)
Glucose-Capillary: 103 mg/dL — ABNORMAL HIGH (ref 70–99)
Glucose-Capillary: 87 mg/dL (ref 70–99)
Glucose-Capillary: 95 mg/dL (ref 70–99)

## 2018-02-26 LAB — HIV ANTIBODY (ROUTINE TESTING W REFLEX): HIV Screen 4th Generation wRfx: NONREACTIVE

## 2018-02-26 MED ORDER — HEPARIN SODIUM (PORCINE) 1000 UNIT/ML IJ SOLN
INTRAMUSCULAR | Status: AC
  Administered 2018-02-26: 5000 [IU] via INTRAVENOUS_CENTRAL
  Filled 2018-02-26: qty 5

## 2018-02-26 MED ORDER — VANCOMYCIN HCL IN DEXTROSE 1-5 GM/200ML-% IV SOLN
1000.0000 mg | INTRAVENOUS | Status: AC
  Administered 2018-02-26: 1000 mg via INTRAVENOUS

## 2018-02-26 MED ORDER — CHLORHEXIDINE GLUCONATE CLOTH 2 % EX PADS
6.0000 | MEDICATED_PAD | Freq: Every day | CUTANEOUS | Status: DC
  Administered 2018-02-27: 6 via TOPICAL

## 2018-02-26 MED ORDER — LIDOCAINE HCL (PF) 1 % IJ SOLN: 5.0000 mL | INTRAMUSCULAR | Status: DC | PRN

## 2018-02-26 MED ORDER — PRO-STAT SUGAR FREE PO LIQD
30.0000 mL | Freq: Two times a day (BID) | ORAL | Status: DC
  Administered 2018-02-26 – 2018-02-28 (×2): 30 mL via ORAL
  Filled 2018-02-26 (×4): qty 30

## 2018-02-26 MED ORDER — SODIUM CHLORIDE 0.9 % IV SOLN: 100.0000 mL | INTRAVENOUS | Status: DC | PRN

## 2018-02-26 MED ORDER — HEPARIN SODIUM (PORCINE) 1000 UNIT/ML DIALYSIS
5000.0000 [IU] | Freq: Once | INTRAMUSCULAR | Status: AC
  Administered 2018-02-26: 5000 [IU] via INTRAVENOUS_CENTRAL

## 2018-02-26 MED ORDER — VANCOMYCIN HCL IN DEXTROSE 1-5 GM/200ML-% IV SOLN
INTRAVENOUS | Status: AC
  Administered 2018-02-26: 1000 mg via INTRAVENOUS
  Filled 2018-02-26: qty 200

## 2018-02-26 MED ORDER — PENTAFLUOROPROP-TETRAFLUOROETH EX AERO: 1.0000 "application " | INHALATION_SPRAY | CUTANEOUS | Status: DC | PRN

## 2018-02-26 MED ORDER — LIDOCAINE-PRILOCAINE 2.5-2.5 % EX CREA
1.0000 "application " | TOPICAL_CREAM | CUTANEOUS | Status: DC | PRN
  Filled 2018-02-26: qty 5

## 2018-02-26 MED ORDER — CHLORHEXIDINE GLUCONATE CLOTH 2 % EX PADS: 6.0000 | MEDICATED_PAD | Freq: Every day | CUTANEOUS | Status: DC

## 2018-02-26 MED ORDER — HEPARIN SODIUM (PORCINE) 1000 UNIT/ML DIALYSIS
1000.0000 [IU] | INTRAMUSCULAR | Status: DC | PRN
  Filled 2018-02-26: qty 1

## 2018-02-26 MED ORDER — ALUM & MAG HYDROXIDE-SIMETH 200-200-20 MG/5ML PO SUSP
30.0000 mL | Freq: Four times a day (QID) | ORAL | Status: DC | PRN
  Administered 2018-02-26 (×2): 30 mL via ORAL
  Filled 2018-02-26 (×2): qty 30

## 2018-02-26 MED ORDER — RENA-VITE PO TABS
1.0000 | ORAL_TABLET | Freq: Every day | ORAL | Status: DC
  Administered 2018-02-26 – 2018-03-13 (×16): 1 via ORAL
  Filled 2018-02-26 (×17): qty 1

## 2018-02-26 NOTE — Consult Note (Signed)
ORTHOPAEDIC CONSULTATION  REQUESTING PHYSICIAN: McDiarmid, Blane Ohara, MD  Chief Complaint: Osteomyelitis abscess right foot second toe.  HPI: Rachel Chaney is a 58 y.o. female who presents with necrotic right foot second toe with osteomyelitis and abscess extending over to the third toe.  Has diabetic insensate neuropathy chronic kidney disease currently on dialysis with severe protein caloric malnutrition.  Past Medical History:  Diagnosis Date  . CHF (congestive heart failure) ([place])   . Chronic kidney disease   . Diabetes mellitus   . Hyperlipidemia   . Hypertension    Past Surgical History:  Procedure Laterality Date  . AV FISTULA PLACEMENT Left 04/15/2015   Procedure: ARTERIOVENOUS (AV) FISTULA CREATION;  Surgeon: Mal Misty, MD;  Location: [facility];  Service: Vascular;  Laterality: Left;  . EYE SURGERY Right    retinal detachment  . FISTULA SUPERFICIALIZATION Left 06/03/2015   Procedure: LEFT UPPER ARM BRACHIOCEPHALIC FISTULA SUPERFICIALIZATION;  Surgeon: Mal Misty, MD;  Location: [facility] OR;  Service: Vascular;  Laterality: Left;  from MAC to General   Social History   Socioeconomic History  . Marital status: Married    Spouse name: Not on file  . Number of children: Not on file  . Years of education: Not on file  . Highest education level: Not on file  Occupational History  . Not on file  Social Needs  . Financial resource strain: Not on file  . Food insecurity:    Worry: Not on file    Inability: Not on file  . Transportation needs:    Medical: Not on file    Non-medical: Not on file  Tobacco Use  . Smoking status: Never Smoker  . Smokeless tobacco: Never Used  Substance and Sexual Activity  . Alcohol use: No    Alcohol/week: 0.0 standard drinks  . Drug use: No  . Sexual activity: Never  Lifestyle  . Physical activity:    Days per week: Not on file    Minutes per session: Not on file  . Stress: Not on file  Relationships  . Social connections:    Talks on phone: Not on file    Gets together: Not on file    Attends religious service: Not on file    Active member of club or organization: Not on file    Attends meetings of clubs or organizations: Not on file    Relationship status: Not on file  Other Topics Concern  . Not on file  Social History Narrative  . Not on file   Family History  Problem Relation Age of Onset  . Heart disease Mother   . Heart disease Father    - negative except otherwise stated in the family history section No Known Allergies Prior to Admission medications   Medication Sig Start Date End Date Taking? Authorizing Provider  acetaminophen (TYLENOL) 500 MG tablet Take 1,000 mg by mouth every 6 (six) hours as needed for pain.   Yes [provider]  carvedilol (COREG) 25 MG tablet Take 1 tablet (25 mg total) by mouth 2 (two) times daily with a meal. 10/17/17  Yes Orson Eva J, DO  cyclobenzaprine (FLEXERIL) 10 MG tablet Take 1 tablet (10 mg total) by mouth at bedtime. 05/21/17  Yes Mercy Riding, MD  ferric citrate (AURYXIA) 1 GM 210 MG(Fe) tablet Take 420 mg by mouth 3 (three) times daily with meals.   Yes [provider]  hydrALAZINE (APRESOLINE) 50 MG tablet Take 1 tablet (  50 mg total) by mouth 3 (three) times daily. Patient taking differently: Take 50 mg by mouth 2 (two) times daily.  10/17/17  Yes Orson Eva J, DO  hydrOXYzine (ATARAX/VISTARIL) 50 MG tablet Take 50 mg by mouth 2 (two) times daily as needed for itching.   Yes [provider]  LEVEMIR FLEXTOUCH 100 UNIT/ML Pen ADMINISTER 50 UNITS UNDER THE SKIN TWICE DAILY Patient taking differently: Inject 50 Units into the skin 2 (two) times daily.  01/07/18  Yes Murdo Bing, DO  multivitamin (RENA-VIT) TABS tablet Take 1 tablet by mouth at bedtime. 07/20/15  Yes Haney, Alyssa A, MD  atorvastatin (LIPITOR) 80 MG tablet Take 1 tablet (80 mg total) by mouth daily. 10/17/17   Bufford Lope, DO  Blood Glucose Monitoring Suppl (ONE  TOUCH ULTRA 2) w/Device KIT 1 kit by Does not apply route 3 (three) times daily. ICD-10 code: E11.40 08/23/16   Dickie La, MD  calcitRIOL (ROCALTROL) 0.25 MCG capsule TAKE 1 CAPSULE (0.25 MCG TOTAL) BY MOUTH DAILY. Patient taking differently: Take 0.25 mcg by mouth daily.  02/19/15   Veatrice Bourbon, MD  calcium acetate (PHOSLO) 667 MG capsule Take 1 capsule (667 mg total) by mouth 3 (three) times daily with meals. Patient not taking: Reported on 02/25/2018 04/19/15   Verner Mould, MD  Darbepoetin Alfa (ARANESP) 25 MCG/0.42ML SOSY injection Inject 0.42 mLs (25 mcg total) into the vein every Monday with hemodialysis. Patient not taking: Reported on 02/25/2018 04/19/15   Verner Mould, MD  fluticasone [facility]) 50 MCG/ACT nasal spray Place 1 spray into both nostrils daily. 1 spray in each nostril every day Patient not taking: Reported on 02/25/2018 04/12/17   Bufford Lope, DO  glucose blood (ONE TOUCH ULTRA TEST) test strip 1 each by Other route 3 (three) times daily. ICD-10 code: E11.40. 08/23/16   Dickie La, MD  glucose monitoring kit (FREESTYLE) monitoring kit 1 each by Does not apply route as needed for other. 11/09/16   Bufford Lope, DO  Insulin Pen Needle (B-D UF III MINI PEN NEEDLES) 31G X 5 MM MISC USE AS DIRECTED FOR INSULIN INJECTION TWICE DAILY 03/09/17   Bufford Lope, DO  Insulin Pen Needle 29G X 12MM MISC Inject 1 pen into the skin 2 (two) times daily. Check blood sugar daily. 04/29/13   Waldemar Dickens, MD  [facility] DELICA LANCETS 90Z MISC 1 Device by Does not apply route as needed (to check blood glucose). 03/09/17   Bufford Lope, DO  ULTICARE INSULIN SYRINGE 30G X 5/16" 1 ML MISC USE AS DIRECTED 02/02/16   Haney, Alyssa A, MD  progesterone (PROMETRIUM) 200 MG capsule Take 1 capsule (200 mg total) by mouth daily. Take for 10 days if bleeding occurs for more than 4 days. 11/09/10 03/28/11  Lyndal Pulley, DO   Dg Foot Complete Right  Result Date: 02/25/2018 CLINICAL  DATA:  Toe injury. EXAM: RIGHT FOOT COMPLETE - 3+ VIEW COMPARISON:  Radiographs of May 14, 2012. FINDINGS: Moderately displaced fracture is seen involving the proximal portion of the first proximal phalanx with intra-articular extension with some callus formation suggesting subacute fracture. Minimally displaced fracture is seen involving the proximal portion of the second distal phalanx. Soft tissue irregularity or wound is seen involving the distal portion of the second toe. Mild posterior calcaneal spurring is noted. IMPRESSION: Probable subacute moderately displaced fracture is seen involving the first proximal phalanx with intra-articular extension. Probable minimally displaced acute fracture  is seen involving proximal portion of the second distal phalanx. Overlying soft tissue irregularity or wound is seen involving the distal soft tissues of the second toe. Electronically Signed   By: Marijo Conception, M.D.   On: 02/25/2018 12:17   - pertinent xrays, CT, MRI studies were reviewed and independently interpreted  Positive ROS: All other systems have been reviewed and were otherwise negative with the exception of those mentioned in the HPI and as above.  Physical Exam: General: Alert, no acute distress Psychiatric: Patient is competent for consent with normal mood and affect Lymphatic: No axillary or cervical lymphadenopathy Cardiovascular: No pedal edema Respiratory: No cyanosis, no use of accessory musculature GI: No organomegaly, abdomen is soft and non-tender    Images:  _0 @  Labs:  Lab Results  Component Value Date   HGBA1C 9.1 (A) 08/23/2017   HGBA1C 8.6 04/12/2017   HGBA1C 8.6 08/22/2016   ESRSEDRATE 58 (H) 04/12/2015   ESRSEDRATE 85 (H) 09/11/2013   ESRSEDRATE 77 (H) 10/16/2011   CRP 0.6 04/12/2015   REPTSTATUS 05/20/2012 FINAL 05/14/2012   CULT NO GROWTH 5 DAYS 05/14/2012   LABORGA Multiple bacterial morphotypes present, none 10/16/2011   LABORGA predominant.  Suggest appropriate recollection if  10/16/2011   LABORGA clinically indicated. 10/16/2011    Lab Results  Component Value Date   ALBUMIN 2.6 (L) 02/25/2018   ALBUMIN 2.3 (L) 04/17/2015   ALBUMIN 2.2 (L) 04/16/2015    Neurologic: Patient does not have protective sensation bilateral lower extremities.   MUSCULOSKELETAL:   Skin: Examination patient has sausage digit swelling right foot second toe with necrotic ulceration with exposed bone necrotic tendon and ulceration that extends into the base of the third toe.  Patient has no ulceration or swelling in the left foot.  Patient has a palpable dorsalis pedis and posterior tibial pulse bilaterally.  Radiographs show swelling of the second toe a chronic fracture of the proximal phalanx of the great toe.  Patient has uncontrolled type 2 diabetes with a hemoglobin A1c of 9.1 and has severe protein caloric malnutrition with an albumin that is ranged from 2.2-2.6.  Assessment: Assessment: Uncontrolled type 2 diabetes with insensate neuropathy with end-stage renal disease on dialysis with osteomyelitis and ulceration involving the right foot second toe and third toe.  Plan: Plan: Patient will need at a minimum a second and third ray amputation.  Risk and benefits of surgery were discussed with the patient she states she understands wished to proceed at this time.  Plan for surgery Wednesday.  Patient will need dialysis today she states she missed her dialysis yesterday.  Thank you for the consult and the opportunity to see Rachel Chaney, [place] [phone] 6:50 AM

## 2018-02-26 NOTE — Progress Notes (Deleted)
[facility] Renal Consultation Note    Indication for Consultation:  Management of ESRD/hemodialysis; anemia, hypertension/volume and secondary hyperparathyroidism  PCP:Yoo, Elsia J, DO  HPI: Rachel Chaney is a 58 y.o. female. ESRD 2/2 DM, HTN on HD MWF at [facility], first starting on 04/2015.  Past medical history significant for CHF, type 2 diabetes mellitus, hyperlipidemia, and hypertension. Patient presented to the ED yesterday for toe pain after hitting her toe against her bed 3-4 days ago. She did not realize the extent of the injury due to neuropathy. Vital signs were stable on presentation to ED, afebrile. WBC count was elevated at 27 (runs ~14-19 as outpatient) with Ca 8.6, K 3.7, Hgb 10.8 . X-ray of R foot showed probable subacute, moderately displaced fracture of the first proximal phalanx and minimally displaced acute fracture of the proximal second distal phalanx. Per ortho notes, exam showed open Gustilio-Anderson fracture warranting antibiotic treatment, and she was started on ceftriaxone 2g daily, vancomycin 2g daily (1g with dialysis). Ortho plans for second and third ray amputation tomorrow.   Patient normally dialyzes MWF but missed HD yesterday due to ED visit and had shortened treatment on 02/22/2018. She does report nonproductive cough today. Denies SOB or dyspnea. Denies swelling but noted to have trace edema on exam. No CP, palpitations, dizziness, N/V, diarrhea, abdominal pain or fever. Has LUE AVF and denies any recent dialysis access issues.   Past Medical History:  Diagnosis Date  . CHF (congestive heart failure) ([place])   . Chronic kidney disease   . Diabetes mellitus   . Hyperlipidemia   . Hypertension    Past Surgical History:  Procedure Laterality Date  . AV FISTULA PLACEMENT Left 04/15/2015   Procedure: ARTERIOVENOUS (AV) FISTULA CREATION;  Surgeon: Mal Misty, MD;  Location: [facility];  Service: Vascular;  Laterality: Left;  .  EYE SURGERY Right    retinal detachment  . FISTULA SUPERFICIALIZATION Left 06/03/2015   Procedure: LEFT UPPER ARM BRACHIOCEPHALIC FISTULA SUPERFICIALIZATION;  Surgeon: Mal Misty, MD;  Location: [facility];  Service: Vascular;  Laterality: Left;  from MAC to General   Family History  Problem Relation Age of Onset  . Heart disease Mother   . Heart disease Father    Social History:  reports that she has never smoked. She has never used smokeless tobacco. She reports that she does not drink alcohol or use drugs. No Known Allergies Prior to Admission medications   Medication Sig Start Date End Date Taking? Authorizing Provider  acetaminophen (TYLENOL) 500 MG tablet Take 1,000 mg by mouth every 6 (six) hours as needed for pain.   Yes [provider]  carvedilol (COREG) 25 MG tablet Take 1 tablet (25 mg total) by mouth 2 (two) times daily with a meal. 10/17/17  Yes Orson Eva J, DO  cyclobenzaprine (FLEXERIL) 10 MG tablet Take 1 tablet (10 mg total) by mouth at bedtime. 05/21/17  Yes Mercy Riding, MD  ferric citrate (AURYXIA) 1 GM 210 MG(Fe) tablet Take 420 mg by mouth 3 (three) times daily with meals.   Yes [provider]  hydrALAZINE (APRESOLINE) 50 MG tablet Take 1 tablet (50 mg total) by mouth 3 (three) times daily. Patient taking differently: Take 50 mg by mouth 2 (two) times daily.  10/17/17  Yes Orson Eva J, DO  hydrOXYzine (ATARAX/VISTARIL) 50 MG tablet Take 50 mg by mouth 2 (two) times daily as needed for itching.   Yes [provider]  Ernest Mallick  FLEXTOUCH 100 UNIT/ML Pen ADMINISTER 50 UNITS UNDER THE SKIN TWICE DAILY Patient taking differently: Inject 50 Units into the skin 2 (two) times daily.  01/07/18  Yes [place] Bing, DO  multivitamin (RENA-VIT) TABS tablet Take 1 tablet by mouth at bedtime. 07/20/15  Yes Haney, Alyssa A, MD  atorvastatin (LIPITOR) 80 MG tablet Take 1 tablet (80 mg total) by mouth daily. 10/17/17   Bufford Lope, DO  Blood Glucose  Monitoring Suppl (ONE TOUCH ULTRA 2) w/Device KIT 1 kit by Does not apply route 3 (three) times daily. ICD-10 code: E11.40 08/23/16   Dickie La, MD  calcitRIOL (ROCALTROL) 0.25 MCG capsule TAKE 1 CAPSULE (0.25 MCG TOTAL) BY MOUTH DAILY. Patient taking differently: Take 0.25 mcg by mouth daily.  02/19/15   Veatrice Bourbon, MD  calcium acetate (PHOSLO) 667 MG capsule Take 1 capsule (667 mg total) by mouth 3 (three) times daily with meals. Patient not taking: Reported on 02/25/2018 04/19/15   Verner Mould, MD  Darbepoetin Alfa (ARANESP) 25 MCG/0.42ML SOSY injection Inject 0.42 mLs (25 mcg total) into the vein every Monday with hemodialysis. Patient not taking: Reported on 02/25/2018 04/19/15   Verner Mould, MD  fluticasone [facility]) 50 MCG/ACT nasal spray Place 1 spray into both nostrils daily. 1 spray in each nostril every day Patient not taking: Reported on 02/25/2018 04/12/17   Bufford Lope, DO  glucose blood (ONE TOUCH ULTRA TEST) test strip 1 each by Other route 3 (three) times daily. ICD-10 code: E11.40. 08/23/16   Dickie La, MD  glucose monitoring kit (FREESTYLE) monitoring kit 1 each by Does not apply route as needed for other. 11/09/16   Bufford Lope, DO  Insulin Pen Needle (B-D UF III MINI PEN NEEDLES) 31G X 5 MM MISC USE AS DIRECTED FOR INSULIN INJECTION TWICE DAILY 03/09/17   Bufford Lope, DO  Insulin Pen Needle 29G X 12MM MISC Inject 1 pen into the skin 2 (two) times daily. Check blood sugar daily. 04/29/13   Waldemar Dickens, MD  [facility] DELICA LANCETS 79G MISC 1 Device by Does not apply route as needed (to check blood glucose). 03/09/17   Bufford Lope, DO  ULTICARE INSULIN SYRINGE 30G X 5/16" 1 ML MISC USE AS DIRECTED 02/02/16   Haney, Alyssa A, MD  progesterone (PROMETRIUM) 200 MG capsule Take 1 capsule (200 mg total) by mouth daily. Take for 10 days if bleeding occurs for more than 4 days. 11/09/10 03/28/11  Lyndal Pulley, DO   Current Facility-Administered  Medications  Medication Dose Route Frequency Provider Last Rate Last Dose  . acetaminophen (TYLENOL) tablet 650 mg  650 mg Oral Q6H PRN Milus Banister C, DO       Or  . acetaminophen (TYLENOL) suppository 650 mg  650 mg Rectal Q6H PRN Daisy Floro, DO      . alum & mag hydroxide-simeth (MAALOX/MYLANTA) 200-200-20 MG/5ML suspension 30 mL  30 mL Oral Q6H PRN Wilber Oliphant, MD   30 mL at 02/26/18 0310  . atorvastatin (LIPITOR) tablet 80 mg  80 mg Oral Daily Milus Banister C, DO   80 mg at 02/26/18 9211  . calcitRIOL (ROCALTROL) capsule 0.25 mcg  0.25 mcg Oral Daily Milus Banister C, DO   0.25 mcg at 02/26/18 9417  . carvedilol (COREG) tablet 25 mg  25 mg Oral BID WC Milus Banister C, DO   25 mg at 02/26/18 4081  . cefTRIAXone (ROCEPHIN) 2 g in  sodium chloride 0.9 % 100 mL IVPB  2 g Intravenous Q24H Milus Banister C, DO   Stopped at 02/25/18 1744  . docusate sodium (COLACE) capsule 100 mg  100 mg Oral BID Milus Banister C, DO   100 mg at 02/26/18 9794  . ferric citrate (AURYXIA) tablet 420 mg  420 mg Oral TID WC Milus Banister C, DO   420 mg at 02/26/18 8016  . guaiFENesin-dextromethorphan (ROBITUSSIN DM) 100-10 MG/5ML syrup 5 mL  5 mL Oral Q4H PRN Wilber Oliphant, MD   5 mL at 02/25/18 2358  . heparin injection 5,000 Units  5,000 Units Subcutaneous Q8H Milus Banister C, DO   5,000 Units at 02/26/18 5537  . hydrALAZINE (APRESOLINE) tablet 50 mg  50 mg Oral TID Milus Banister C, DO   50 mg at 02/26/18 4827  . insulin aspart (novoLOG) injection 0-15 Units  0-15 Units Subcutaneous TID WC Milus Banister C, DO   2 Units at 02/26/18 [phone]  . insulin detemir (LEVEMIR) injection 25 Units  25 Units Subcutaneous BID Wilber Oliphant, MD   25 Units at 02/26/18 [phone]  . oxyCODONE (Oxy IR/ROXICODONE) immediate release tablet 5 mg  5 mg Oral Q4H PRN Milus Banister C, DO   5 mg at 02/25/18 2250  . senna (SENOKOT) tablet 8.6 mg  1 tablet Oral BID Milus Banister C, DO   8.6 mg at 02/26/18 9201   . [START ON 02/27/2018] vancomycin (VANCOCIN) IVPB 1000 mg/200 mL premix  1,000 mg Intravenous Q M,W,F-HD Bertis Ruddy, [facility]       Labs: Basic Metabolic Panel: Recent Labs  Lab 02/25/18 1105 02/26/18 0348  NA 135 133*  K 3.7 3.9  CL 90* 90*  CO2 30 27  GLUCOSE 69* 162*  BUN 44* 51*  CREATININE 8.98* 9.62*  CALCIUM 8.6* 8.2*   Liver Function Tests: Recent Labs  Lab 02/25/18 1105  AST 15  ALT 18  ALKPHOS 85  BILITOT 0.6  PROT 6.9  ALBUMIN 2.6*   CBC: Recent Labs  Lab 02/25/18 1105 02/26/18 0348  WBC 27.0* 29.3*  NEUTROABS 22.7*  --   HGB 11.1* 10.8*  HCT 36.9 35.0*  MCV 103.1* 102.0*  PLT 247 254    CBG: Recent Labs  Lab 02/25/18 1852 02/25/18 2247 02/26/18 0548  GLUCAP 107* 129* 141*   Studies/Results: Dg Foot Complete Right  Result Date: 02/25/2018 CLINICAL DATA:  Toe injury. EXAM: RIGHT FOOT COMPLETE - 3+ VIEW COMPARISON:  Radiographs of May 14, 2012. FINDINGS: Moderately displaced fracture is seen involving the proximal portion of the first proximal phalanx with intra-articular extension with some callus formation suggesting subacute fracture. Minimally displaced fracture is seen involving the proximal portion of the second distal phalanx. Soft tissue irregularity or wound is seen involving the distal portion of the second toe. Mild posterior calcaneal spurring is noted. IMPRESSION: Probable subacute moderately displaced fracture is seen involving the first proximal phalanx with intra-articular extension. Probable minimally displaced acute fracture is seen involving proximal portion of the second distal phalanx. Overlying soft tissue irregularity or wound is seen involving the distal soft tissues of the second toe. Electronically Signed   By: Marijo Conception, M.D.   On: 02/25/2018 12:17    ROS: All others negative except those listed in HPI.  Physical Exam: Vitals:   02/25/18 1626 02/25/18 1804 02/25/18 2049 02/26/18 0404  BP:  131/61 (!) 126/51 (!)  118/47  Pulse:  80 81 82  Resp:  20 16 16  Temp:  98.9 F (37.2 C) 98.6 F (37 C) 99.8 F (37.7 C)  TempSrc:  Oral Oral Oral  SpO2:  98% 97% 92%  Weight: 104.3 kg     Height: _0  (1.626 m)        General: WDWN NAD Head: NCAT, sclera not icteric, EOM intact Neck: Supple. No lymphadenopathy Lungs: CTA bilaterally. No wheeze, rales or rhonchi. Breathing is unlabored. Heart: RRR. No murmur, rubs or gallops.  Abdomen: soft, nontender, +BS, no guarding, no rebound tenderness Lower extremities: trace edema b/l lower extremities, R foot bandaged Neuro: AAOx3. Moves all extremities spontaneously. Psych:  Responds to questions appropriately with a normal affect. Dialysis Access: LUE AVF, + thrill and bruit  Dialysis Orders:  MWF [facility]  4:00 hrs, BFR 400, DFR 800,  EDW 109.0, 2K/ 2Ca Access: LUE AVF Heparin: 8000 unit IV bolus, 2000 unit IV intermittent bolus Mircera: None Calcitriol: 1.75 mcg PO qHD  Last Labs: 01/28/2018: Hgb 12.1, TSAT 30.0, K 4.7, Ca 8.2, P 6.3, PTH 1028.0, Alb 3.6.   Assessment/Plan: 1. Osteomyelitis and ulceration of right foot second toe and third toe: On ceftriaxone and vancomycin. Plan for surgery tomorrow for at minimum second and third ray amputation per ortho. Pain management per primary team.  2.  ESRD -  Normally dialyzes MWF but missed dialysis yesterday. Noted with some edema today. Will plan for dialysis today with UF goal of 3.0L. Will plan for HD with LU AVF 3K/2.25Ca bath.  3.  Hypertension/volume  - BP adequately controlled. Below outpatient EDW and noted to have trace edema on exam. Will plan for 3L UFG today.  4.  Anemia of CKD - Hgb 10.8, runs in the 12's as outpatient. Suspect some blood loss with injury. Follow trend.  5.  Secondary Hyperparathyroidism -  Managed as outpatient. Continue calcitriol 1.75mg 3 times weekly during dialysis. Continue auryxia.  6.  Nutrition - Albumin 2.6. Will start protein supplementation. 7.  T2DM: Last A1c 9.1%  on 08/23/17. History of severe neuropathy in lower extremities.Insulin per primary team.  SAnice Paganini PA-C [facility] Pager: [phone]/18/2020, 9:51 AM

## 2018-02-26 NOTE — H&P (View-Only) (Signed)
  ORTHOPAEDIC CONSULTATION  REQUESTING PHYSICIAN: McDiarmid, Todd D, MD  Chief Complaint: Osteomyelitis abscess right foot second toe.  HPI: Rachel Chaney is a 57 y.o. female who presents with necrotic right foot second toe with osteomyelitis and abscess extending over to the third toe.  Has diabetic insensate neuropathy chronic kidney disease currently on dialysis with severe protein caloric malnutrition.  Past Medical History:  Diagnosis Date  . CHF (congestive heart failure) (HCC)   . Chronic kidney disease   . Diabetes mellitus   . Hyperlipidemia   . Hypertension    Past Surgical History:  Procedure Laterality Date  . AV FISTULA PLACEMENT Left 04/15/2015   Procedure: ARTERIOVENOUS (AV) FISTULA CREATION;  Surgeon: James D Lawson, MD;  Location: [facility];  Service: Vascular;  Laterality: Left;  . EYE SURGERY Right    retinal detachment  . FISTULA SUPERFICIALIZATION Left 06/03/2015   Procedure: LEFT UPPER ARM BRACHIOCEPHALIC FISTULA SUPERFICIALIZATION;  Surgeon: James D Lawson, MD;  Location: [facility] OR;  Service: Vascular;  Laterality: Left;  from MAC to General   Social History   Socioeconomic History  . Marital status: Married    Spouse name: Not on file  . Number of children: Not on file  . Years of education: Not on file  . Highest education level: Not on file  Occupational History  . Not on file  Social Needs  . Financial resource strain: Not on file  . Food insecurity:    Worry: Not on file    Inability: Not on file  . Transportation needs:    Medical: Not on file    Non-medical: Not on file  Tobacco Use  . Smoking status: Never Smoker  . Smokeless tobacco: Never Used  Substance and Sexual Activity  . Alcohol use: No    Alcohol/week: 0.0 standard drinks  . Drug use: No  . Sexual activity: Never  Lifestyle  . Physical activity:    Days per week: Not on file    Minutes per session: Not on file  . Stress: Not on file  Relationships  . Social connections:    Talks on phone: Not on file    Gets together: Not on file    Attends religious service: Not on file    Active member of club or organization: Not on file    Attends meetings of clubs or organizations: Not on file    Relationship status: Not on file  Other Topics Concern  . Not on file  Social History Narrative  . Not on file   Family History  Problem Relation Age of Onset  . Heart disease Mother   . Heart disease Father    - negative except otherwise stated in the family history section No Known Allergies Prior to Admission medications   Medication Sig Start Date End Date Taking? Authorizing Provider  acetaminophen (TYLENOL) 500 MG tablet Take 1,000 mg by mouth every 6 (six) hours as needed for pain.   Yes [provider]  carvedilol (COREG) 25 MG tablet Take 1 tablet (25 mg total) by mouth 2 (two) times daily with a meal. 10/17/17  Yes Yoo, Elsia J, DO  cyclobenzaprine (FLEXERIL) 10 MG tablet Take 1 tablet (10 mg total) by mouth at bedtime. 05/21/17  Yes Gonfa, Taye T, MD  ferric citrate (AURYXIA) 1 GM 210 MG(Fe) tablet Take 420 mg by mouth 3 (three) times daily with meals.   Yes [provider]  hydrALAZINE (APRESOLINE) 50 MG tablet Take 1 tablet (  50 mg total) by mouth 3 (three) times daily. Patient taking differently: Take 50 mg by mouth 2 (two) times daily.  10/17/17  Yes Yoo, Elsia J, DO  hydrOXYzine (ATARAX/VISTARIL) 50 MG tablet Take 50 mg by mouth 2 (two) times daily as needed for itching.   Yes [provider]  LEVEMIR FLEXTOUCH 100 UNIT/ML Pen ADMINISTER 50 UNITS UNDER THE SKIN TWICE DAILY Patient taking differently: Inject 50 Units into the skin 2 (two) times daily.  01/07/18  Yes McMullen, David J, DO  multivitamin (RENA-VIT) TABS tablet Take 1 tablet by mouth at bedtime. 07/20/15  Yes Haney, Alyssa A, MD  atorvastatin (LIPITOR) 80 MG tablet Take 1 tablet (80 mg total) by mouth daily. 10/17/17   Yoo, Elsia J, DO  Blood Glucose Monitoring Suppl (ONE  TOUCH ULTRA 2) w/Device KIT 1 kit by Does not apply route 3 (three) times daily. ICD-10 code: E11.40 08/23/16   Neal, Sara L, MD  calcitRIOL (ROCALTROL) 0.25 MCG capsule TAKE 1 CAPSULE (0.25 MCG TOTAL) BY MOUTH DAILY. Patient taking differently: Take 0.25 mcg by mouth daily.  02/19/15   Haney, Alyssa A, MD  calcium acetate (PHOSLO) 667 MG capsule Take 1 capsule (667 mg total) by mouth 3 (three) times daily with meals. Patient not taking: Reported on 02/25/2018 04/19/15   Lancaster, Abigail Joseph, MD  Darbepoetin Alfa (ARANESP) 25 MCG/0.42ML SOSY injection Inject 0.42 mLs (25 mcg total) into the vein every Monday with hemodialysis. Patient not taking: Reported on 02/25/2018 04/19/15   [place]aster, Abigail Joseph, MD  fluticasone (FLONASE) 50 MCG/ACT nasal spray Place 1 spray into both nostrils daily. 1 spray in each nostril every day Patient not taking: Reported on 02/25/2018 04/12/17   Yoo, Elsia J, DO  glucose blood (ONE TOUCH ULTRA TEST) test strip 1 each by Other route 3 (three) times daily. ICD-10 code: E11.40. 08/23/16   Neal, Sara L, MD  glucose monitoring kit (FREESTYLE) monitoring kit 1 each by Does not apply route as needed for other. 11/09/16   Yoo, Elsia J, DO  Insulin Pen Needle (B-D UF III MINI PEN NEEDLES) 31G X 5 MM MISC USE AS DIRECTED FOR INSULIN INJECTION TWICE DAILY 03/09/17   Yoo, Elsia J, DO  Insulin Pen Needle 29G X 12MM MISC Inject 1 pen into the skin 2 (two) times daily. Check blood sugar daily. 04/29/13   Merrell, David J, MD  ONETOUCH DELICA LANCETS 33G MISC 1 Device by Does not apply route as needed (to check blood glucose). 03/09/17   Yoo, Elsia J, DO  ULTICARE INSULIN SYRINGE 30G X 5/16" 1 ML MISC USE AS DIRECTED 02/02/16   Haney, Alyssa A, MD  progesterone (PROMETRIUM) 200 MG capsule Take 1 capsule (200 mg total) by mouth daily. Take for 10 days if bleeding occurs for more than 4 days. 11/09/10 03/28/11  Smith, Zachary M, DO   Dg Foot Complete Right  Result Date: 02/25/2018 CLINICAL  DATA:  Toe injury. EXAM: RIGHT FOOT COMPLETE - 3+ VIEW COMPARISON:  Radiographs of May 14, 2012. FINDINGS: Moderately displaced fracture is seen involving the proximal portion of the first proximal phalanx with intra-articular extension with some callus formation suggesting subacute fracture. Minimally displaced fracture is seen involving the proximal portion of the second distal phalanx. Soft tissue irregularity or wound is seen involving the distal portion of the second toe. Mild posterior calcaneal spurring is noted. IMPRESSION: Probable subacute moderately displaced fracture is seen involving the first proximal phalanx with intra-articular extension. Probable minimally displaced acute fracture   is seen involving proximal portion of the second distal phalanx. Overlying soft tissue irregularity or wound is seen involving the distal soft tissues of the second toe. Electronically Signed   By: James  Green Jr, M.D.   On: 02/25/2018 12:17   - pertinent xrays, CT, MRI studies were reviewed and independently interpreted  Positive ROS: All other systems have been reviewed and were otherwise negative with the exception of those mentioned in the HPI and as above.  Physical Exam: General: Alert, no acute distress Psychiatric: Patient is competent for consent with normal mood and affect Lymphatic: No axillary or cervical lymphadenopathy Cardiovascular: No pedal edema Respiratory: No cyanosis, no use of accessory musculature GI: No organomegaly, abdomen is soft and non-tender    Images:  @ENCIMAGES@  Labs:  Lab Results  Component Value Date   HGBA1C 9.1 (A) 08/23/2017   HGBA1C 8.6 04/12/2017   HGBA1C 8.6 08/22/2016   ESRSEDRATE 58 (H) 04/12/2015   ESRSEDRATE 85 (H) 09/11/2013   ESRSEDRATE 77 (H) 10/16/2011   CRP 0.6 04/12/2015   REPTSTATUS 05/20/2012 FINAL 05/14/2012   CULT NO GROWTH 5 DAYS 05/14/2012   LABORGA Multiple bacterial morphotypes present, none 10/16/2011   LABORGA predominant.  Suggest appropriate recollection if  10/16/2011   LABORGA clinically indicated. 10/16/2011    Lab Results  Component Value Date   ALBUMIN 2.6 (L) 02/25/2018   ALBUMIN 2.3 (L) 04/17/2015   ALBUMIN 2.2 (L) 04/16/2015    Neurologic: Patient does not have protective sensation bilateral lower extremities.   MUSCULOSKELETAL:   Skin: Examination patient has sausage digit swelling right foot second toe with necrotic ulceration with exposed bone necrotic tendon and ulceration that extends into the base of the third toe.  Patient has no ulceration or swelling in the left foot.  Patient has a palpable dorsalis pedis and posterior tibial pulse bilaterally.  Radiographs show swelling of the second toe a chronic fracture of the proximal phalanx of the great toe.  Patient has uncontrolled type 2 diabetes with a hemoglobin A1c of 9.1 and has severe protein caloric malnutrition with an albumin that is ranged from 2.2-2.6.  Assessment: Assessment: Uncontrolled type 2 diabetes with insensate neuropathy with end-stage renal disease on dialysis with osteomyelitis and ulceration involving the right foot second toe and third toe.  Plan: Plan: Patient will need at a minimum a second and third ray amputation.  Risk and benefits of surgery were discussed with the patient she states she understands wished to proceed at this time.  Plan for surgery Wednesday.  Patient will need dialysis today she states she missed her dialysis yesterday.  Thank you for the consult and the opportunity to see Ms. Klinedinst  Marcus Duda, MD [facility] [phone] 6:50 AM     

## 2018-02-26 NOTE — Plan of Care (Signed)

## 2018-02-26 NOTE — Consult Note (Addendum)
[facility] Renal Consultation Note    Indication for Consultation:  Management of ESRD/hemodialysis; anemia, hypertension/volume and secondary hyperparathyroidism  PCP:Yoo, Elsia J, DO  HPI: Rachel Chaney is a 58 y.o. female. ESRD 2/2 DM, HTN on HD MWF at [facility], first starting on 04/2015.  Past medical history significant for CHF, type 2 diabetes mellitus, hyperlipidemia, and hypertension. Patient presented to the ED yesterday for toe pain after hitting her toe against her bed 3-4 days ago. She did not realize the extent of the injury due to neuropathy. Vital signs were stable on presentation to ED, afebrile. WBC count was elevated at 27 (runs ~14-19 as outpatient) with Ca 8.6, K 3.7, Hgb 10.8 . X-ray of R foot showed probable subacute, moderately displaced fracture of the first proximal phalanx and minimally displaced acute fracture of the proximal second distal phalanx. Per ortho notes, exam showed open Gustilio-Anderson fracture warranting antibiotic treatment, and she was started on ceftriaxone 2g daily, vancomycin 2g daily (1g with dialysis). Ortho plans for second and third ray amputation tomorrow.   Patient normally dialyzes MWF but missed HD yesterday due to ED visit and had shortened treatment on 02/22/2018. She does report nonproductive cough today. Denies SOB or dyspnea. Denies swelling but noted to have trace edema on exam. No CP, palpitations, dizziness, N/V, diarrhea, abdominal pain or fever. Has LUE AVF and denies any recent dialysis access issues.   Past Medical History:  Diagnosis Date  . CHF (congestive heart failure) ([place])   . Chronic kidney disease   . Diabetes mellitus   . Hyperlipidemia   . Hypertension    Past Surgical History:  Procedure Laterality Date  . AV FISTULA PLACEMENT Left 04/15/2015   Procedure: ARTERIOVENOUS (AV) FISTULA CREATION;  Surgeon: Mal Misty, MD;  Location: [place];  Service: Vascular;  Laterality: Left;   . EYE SURGERY Right    retinal detachment  . FISTULA SUPERFICIALIZATION Left 06/03/2015   Procedure: LEFT UPPER ARM BRACHIOCEPHALIC FISTULA SUPERFICIALIZATION;  Surgeon: Mal Misty, MD;  Location: [place];  Service: Vascular;  Laterality: Left;  from MAC to General   Family History  Problem Relation Age of Onset  . Heart disease Mother   . Heart disease Father    Social History:  reports that she has never smoked. She has never used smokeless tobacco. She reports that she does not drink alcohol or use drugs. No Known Allergies Prior to Admission medications   Medication Sig Start Date End Date Taking? Authorizing Provider  acetaminophen (TYLENOL) 500 MG tablet Take 1,000 mg by mouth every 6 (six) hours as needed for pain.   Yes [provider]  carvedilol (COREG) 25 MG tablet Take 1 tablet (25 mg total) by mouth 2 (two) times daily with a meal. 10/17/17  Yes Orson Eva J, DO  cyclobenzaprine (FLEXERIL) 10 MG tablet Take 1 tablet (10 mg total) by mouth at bedtime. 05/21/17  Yes Mercy Riding, MD  ferric citrate (AURYXIA) 1 GM 210 MG(Fe) tablet Take 420 mg by mouth 3 (three) times daily with meals.   Yes [provider]  hydrALAZINE (APRESOLINE) 50 MG tablet Take 1 tablet (50 mg total) by mouth 3 (three) times daily. Patient taking differently: Take 50 mg by mouth 2 (two) times daily.  10/17/17  Yes Orson Eva J, DO  hydrOXYzine (ATARAX/VISTARIL) 50 MG tablet Take 50 mg by mouth 2 (two) times daily as needed for itching.   Yes [provider]  Ernest Mallick  FLEXTOUCH 100 UNIT/ML Pen ADMINISTER 50 UNITS UNDER THE SKIN TWICE DAILY Patient taking differently: Inject 50 Units into the skin 2 (two) times daily.  01/07/18  Yes [place] Bing, DO  multivitamin (RENA-VIT) TABS tablet Take 1 tablet by mouth at bedtime. 07/20/15  Yes Haney, Alyssa A, MD  atorvastatin (LIPITOR) 80 MG tablet Take 1 tablet (80 mg total) by mouth daily. 10/17/17   Bufford Lope, DO  Blood Glucose  Monitoring Suppl (ONE TOUCH ULTRA 2) w/Device KIT 1 kit by Does not apply route 3 (three) times daily. ICD-10 code: E11.40 08/23/16   Dickie La, MD  calcitRIOL (ROCALTROL) 0.25 MCG capsule TAKE 1 CAPSULE (0.25 MCG TOTAL) BY MOUTH DAILY. Patient taking differently: Take 0.25 mcg by mouth daily.  02/19/15   Veatrice Bourbon, MD  calcium acetate (PHOSLO) 667 MG capsule Take 1 capsule (667 mg total) by mouth 3 (three) times daily with meals. Patient not taking: Reported on 02/25/2018 04/19/15   Verner Mould, MD  Darbepoetin Alfa (ARANESP) 25 MCG/0.42ML SOSY injection Inject 0.42 mLs (25 mcg total) into the vein every Monday with hemodialysis. Patient not taking: Reported on 02/25/2018 04/19/15   Verner Mould, MD  fluticasone [facility]) 50 MCG/ACT nasal spray Place 1 spray into both nostrils daily. 1 spray in each nostril every day Patient not taking: Reported on 02/25/2018 04/12/17   Bufford Lope, DO  glucose blood (ONE TOUCH ULTRA TEST) test strip 1 each by Other route 3 (three) times daily. ICD-10 code: E11.40. 08/23/16   Dickie La, MD  glucose monitoring kit (FREESTYLE) monitoring kit 1 each by Does not apply route as needed for other. 11/09/16   Bufford Lope, DO  Insulin Pen Needle (B-D UF III MINI PEN NEEDLES) 31G X 5 MM MISC USE AS DIRECTED FOR INSULIN INJECTION TWICE DAILY 03/09/17   Bufford Lope, DO  Insulin Pen Needle 29G X 12MM MISC Inject 1 pen into the skin 2 (two) times daily. Check blood sugar daily. 04/29/13   Waldemar Dickens, MD  [facility] DELICA LANCETS 79G MISC 1 Device by Does not apply route as needed (to check blood glucose). 03/09/17   Bufford Lope, DO  ULTICARE INSULIN SYRINGE 30G X 5/16" 1 ML MISC USE AS DIRECTED 02/02/16   Haney, Alyssa A, MD  progesterone (PROMETRIUM) 200 MG capsule Take 1 capsule (200 mg total) by mouth daily. Take for 10 days if bleeding occurs for more than 4 days. 11/09/10 03/28/11  Lyndal Pulley, DO   Current Facility-Administered  Medications  Medication Dose Route Frequency Provider Last Rate Last Dose  . acetaminophen (TYLENOL) tablet 650 mg  650 mg Oral Q6H PRN Milus Banister C, DO       Or  . acetaminophen (TYLENOL) suppository 650 mg  650 mg Rectal Q6H PRN Daisy Floro, DO      . alum & mag hydroxide-simeth (MAALOX/MYLANTA) 200-200-20 MG/5ML suspension 30 mL  30 mL Oral Q6H PRN Wilber Oliphant, MD   30 mL at 02/26/18 0310  . atorvastatin (LIPITOR) tablet 80 mg  80 mg Oral Daily Milus Banister C, DO   80 mg at 02/26/18 9211  . calcitRIOL (ROCALTROL) capsule 0.25 mcg  0.25 mcg Oral Daily Milus Banister C, DO   0.25 mcg at 02/26/18 9417  . carvedilol (COREG) tablet 25 mg  25 mg Oral BID WC Milus Banister C, DO   25 mg at 02/26/18 4081  . cefTRIAXone (ROCEPHIN) 2 g in  sodium chloride 0.9 % 100 mL IVPB  2 g Intravenous Q24H Milus Banister C, DO   Stopped at 02/25/18 1744  . Chlorhexidine Gluconate Cloth 2 % PADS 6 each  6 each Topical Q0600 Janalee Dane, PA-C      . Chlorhexidine Gluconate Cloth 2 % PADS 6 each  6 each Topical Q0600 Janalee Dane, PA-C      . docusate sodium (COLACE) capsule 100 mg  100 mg Oral BID Milus Banister C, DO   100 mg at 02/26/18 9518  . feeding supplement (PRO-STAT SUGAR FREE 64) liquid 30 mL  30 mL Oral BID Collins, Samantha G, PA-C      . ferric citrate (AURYXIA) tablet 420 mg  420 mg Oral TID WC Milus Banister C, DO   420 mg at 02/26/18 8416  . guaiFENesin-dextromethorphan (ROBITUSSIN DM) 100-10 MG/5ML syrup 5 mL  5 mL Oral Q4H PRN Wilber Oliphant, MD   5 mL at 02/25/18 2358  . heparin injection 5,000 Units  5,000 Units Subcutaneous Q8H Milus Banister C, DO   5,000 Units at 02/26/18 6063  . hydrALAZINE (APRESOLINE) tablet 50 mg  50 mg Oral TID Milus Banister C, DO   50 mg at 02/26/18 0160  . insulin aspart (novoLOG) injection 0-15 Units  0-15 Units Subcutaneous TID WC Milus Banister C, DO   2 Units at 02/26/18 [phone]  . insulin detemir (LEVEMIR) injection 25  Units  25 Units Subcutaneous BID Wilber Oliphant, MD   25 Units at 02/26/18 [phone]  . oxyCODONE (Oxy IR/ROXICODONE) immediate release tablet 5 mg  5 mg Oral Q4H PRN Milus Banister C, DO   5 mg at 02/25/18 2250  . senna (SENOKOT) tablet 8.6 mg  1 tablet Oral BID Milus Banister C, DO   8.6 mg at 02/26/18 7322  . [START ON 02/27/2018] vancomycin (VANCOCIN) IVPB 1000 mg/200 mL premix  1,000 mg Intravenous Q M,W,F-HD Bertis Ruddy, [facility]       Labs: Basic Metabolic Panel: Recent Labs  Lab 02/25/18 1105 02/26/18 0348  NA 135 133*  K 3.7 3.9  CL 90* 90*  CO2 30 27  GLUCOSE 69* 162*  BUN 44* 51*  CREATININE 8.98* 9.62*  CALCIUM 8.6* 8.2*   Liver Function Tests: Recent Labs  Lab 02/25/18 1105  AST 15  ALT 18  ALKPHOS 85  BILITOT 0.6  PROT 6.9  ALBUMIN 2.6*   CBC: Recent Labs  Lab 02/25/18 1105 02/26/18 0348  WBC 27.0* 29.3*  NEUTROABS 22.7*  --   HGB 11.1* 10.8*  HCT 36.9 35.0*  MCV 103.1* 102.0*  PLT 247 254   CBG: Recent Labs  Lab 02/25/18 1852 02/25/18 2247 02/26/18 0548  GLUCAP 107* 129* 141*   Studies/Results: Dg Foot Complete Right  Result Date: 02/25/2018 CLINICAL DATA:  Toe injury. EXAM: RIGHT FOOT COMPLETE - 3+ VIEW COMPARISON:  Radiographs of May 14, 2012. FINDINGS: Moderately displaced fracture is seen involving the proximal portion of the first proximal phalanx with intra-articular extension with some callus formation suggesting subacute fracture. Minimally displaced fracture is seen involving the proximal portion of the second distal phalanx. Soft tissue irregularity or wound is seen involving the distal portion of the second toe. Mild posterior calcaneal spurring is noted. IMPRESSION: Probable subacute moderately displaced fracture is seen involving the first proximal phalanx with intra-articular extension. Probable minimally displaced acute fracture is seen involving proximal portion of the second distal phalanx. Overlying soft tissue irregularity or wound is  seen involving the  distal soft tissues of the second toe. Electronically Signed   By: Marijo Conception, M.D.   On: 02/25/2018 12:17    ROS: All others negative except those listed in HPI.  Physical Exam: Vitals:   02/25/18 1626 02/25/18 1804 02/25/18 2049 02/26/18 0404  BP:  131/61 (!) 126/51 (!) 118/47  Pulse:  80 81 82  Resp:  20 16 16   Temp:  98.9 F (37.2 C) 98.6 F (37 C) 99.8 F (37.7 C)  TempSrc:  Oral Oral Oral  SpO2:  98% 97% 92%  Weight: 104.3 kg     Height: 5' 4"  (1.626 m)        General: WDWN NAD Head: NCAT, sclera not icteric, EOM intact Neck: Supple. No lymphadenopathy Lungs: CTA bilaterally. No wheeze, rales or rhonchi. Breathing is unlabored. Heart: RRR. No murmur, rubs or gallops.  Abdomen: soft, nontender, +BS, no guarding, no rebound tenderness Lower extremities: trace edema b/l lower extremities, R foot bandaged Neuro: AAOx3. Moves all extremities spontaneously. Psych:  Responds to questions appropriately with a normal affect. Dialysis Access: LUE AVF, + thrill and bruit  Dialysis Orders:  MWF - [place]  4:00 hrs, BFR 400, DFR 800,  EDW 109.0, 2K/ 2Ca Access: LUE AVF Heparin: 8000 unit IV bolus, 2000 unit IV intermittent bolus Mircera: None Calcitriol: 1.75 mcg PO qHD  Last Labs: 01/28/2018: Hgb 12.1, TSAT 30.0, K 4.7, Ca 8.2, P 6.3, PTH 1028.0, Alb 3.6.  Assessment/Plan: 1. Osteomyelitis and ulceration of right foot second toe and third toe: On ceftriaxone and vancomycin. Plan for surgery tomorrow for at minimum second and third ray amputation per ortho. Pain management per primary team.  2.  ESRD -  Normally dialyzes MWF but missed dialysis yesterday. Noted with some edema today. Will plan for dialysis today with UF goal of 3.0L. Will plan for HD with LU AVF 3K/2.25Ca bath.  3.  Hypertension/volume  - BP adequately controlled. Below outpatient EDW and noted to have trace edema on exam. Will plan for 3L UFG today.  4.  Anemia of CKD - Hgb 10.8, runs in  the 12's as outpatient. Suspect some blood loss with injury. Follow trend.  5.  Secondary Hyperparathyroidism -  Managed as outpatient. Continue calcitriol 1.39mg 3 times weekly during dialysis. Continue auryxia.  6.  Nutrition - Albumin 2.6. Will start protein supplementation. 7.  T2DM: Last A1c 9.1% on 08/23/17. History of severe neuropathy in lower extremities.Insulin per primary team.  SAnice Paganini PA-C [facility] Pager: [phone]/18/2020, 9:51 AM   Pt seen, examined and agree w A/P as above. ESRD pt on HD w/ osteomyelitis of R foot, on IV abx and will need toe amp surgery per ortho tomorrow. Plan HD today as missed yesterday. Will follow.  [facility] 02/26/2018, 11:51 AM

## 2018-02-26 NOTE — Progress Notes (Signed)
Family Medicine Teaching Service Daily Progress Note Intern Pager: [phone]  Patient name: Rachel Chaney Medical record number: [id number] Date of birth: [birth year] Age: 58 y.o. Gender: female  Primary Care Provider: Bufford Lope, DO Consultants: Orthopedics, Nephrology Code Status: Full code  Pt Overview and Major Events to Date:  Rachel Chaney is a 58 y.o. female presenting with painful 2nd toe of R foot. PMH is significant for ESRD on HD MWF, T2DM, HTN, GERD, CHF, and dyslipidemia.  Assessment and Plan:  #Right second toe fracture OPEN, soft tissue infection, concerning for osteomyelitis: Pt hit toe on bed 3-4 days prior to presentation causing open wound in the setting of uncontrolled diabetes. R second toe is erythematous, edematous large ulcer draining necrotic, purulent material. Patient is without signs of systemic infection, has remained afebrile, VSS. WBC 29.3. S/p 1g Ancef in ED. Gustilio-Anderson open fracture scale suggesting Type III fracture warranting broad spectrum antibiotics. Treating for presumed osteomyelitis and diabetic foot wound. Plan for surgery tomorrow. Revised Cardiac Risk Index for Pre-Operative Risk 2, will optimize HTN and DM prior to surgery.  - Ortho consulted, plan to amputate second toe 2/19, appreciate recs - Continue CTX 2 g daily - Continue Vanc 3 g daily, only 1 g with dialysis - Consider broadening to pseudomonal coverage if symptoms worsen, would switch CTX to cefepime or ciprofloxacin - Pain controlled with Tylenol and oxycodone - Ambulate with assistance - Vitals per routine - c/s PT after surgery - NPO after midnight - Will stop heparin before surgery  #End-stage renal disease on HD: Receives hemodialysis MWF at [facility], last HD on Friday, missed session today 2/17.  Is still producing urine. Cr 9.62. Baseline appears to be about 8.3 (from 04/2017). - Dialysis today per Nephro - Nephro consulted, appreciate recs - Renal/carb modified  diet  #Anemia of CKD:  Hgb 10.8, runs in the 12's as outpatient. Per nephro, suspect some blood loss with injury.  - Daily CBCs  #T2DM: Last HbA1c 9.1% 8/15.  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  She denies vision loss and polydipsia.  She reports retinopathy, polyphagia, and polyuria.  Patient reports CBGs between 120-140. Home regimen is 50 units Levemir twice daily. - NPO at midnight tonight for surgery tomorrow. - Continue Levemir 25 U, will increase after surgery - Moderate SSI - Follow-up HbA1c - CBG monitoring 4 times daily  #HTN: Normotensive on admission. Blood pressure on admission 131/61.  Home regimen includes hydralazine and carvedilol.  - Continue home dose hydralazine 50 mg and carvedilol 25 mg - Appreciate nephrology recommendations  #Hypoalbuminemia Albumin 2.6 on admission. No evidence of liver disease. Likely due to protein calorie malnutrition given poor diet. - Nutrition consult, appreciate recs  #Hyperlipidemia: Most recent lipid panel April 2019 showing low cholesterol at 94, low HDL at 32, and other values within normal limits. - Continue atorvastatin 80 mg  #GERD: Patient asymptomatic at this time, usually takes Tums at home. -Monitor for acid reflux symptoms.  FEN/GI: Renal/carb modified diet, NPO at midnight PPx: Heparin  Disposition: Admit to med surg  Subjective:  Ms. Gramajo reports nausea this morning which she attributes to anxiety around the surgery. She has not had vomiting, abdominal pain. Her BMs are regular with her last BM this morning. Otherwise no complaints.  Objective: Temp:  [98.4 F (36.9 C)-99.8 F (37.7 C)] 99.8 F (37.7 C) (02/18 0404) Pulse Rate:  [70-82] 82 (02/18 0404) Resp:  [16-20] 16 (02/18 0404) BP: (97-166)/(47-89) 118/47 (  02/18 0404) SpO2:  [88 %-98 %] 92 % (02/18 0404) Weight:  [104.3 kg] 104.3 kg (02/17 1626)   Physical Exam: General: Well-appearing, obese female,  NAD Cardiovascular: RRR Respiratory: CTAB, normal WOB Abdomen: soft, nontender Extremities: R foot with bandage in place. 2nd toe with large distal necrotic ulceration with purulent discharge.   Laboratory: Recent Labs  Lab 02/25/18 1105 02/26/18 0348  WBC 27.0* 29.3*  HGB 11.1* 10.8*  HCT 36.9 35.0*  PLT 247 254   Recent Labs  Lab 02/25/18 1105 02/26/18 0348  NA 135 133*  K 3.7 3.9  CL 90* 90*  CO2 30 27  BUN 44* 51*  CREATININE 8.98* 9.62*  CALCIUM 8.6* 8.2*  PROT 6.9  --   BILITOT 0.6  --   ALKPHOS 85  --   ALT 18  --   AST 15  --   GLUCOSE 69* 162*     Imaging/Diagnostic Tests: Dg Foot Complete Right  Result Date: 02/25/2018 CLINICAL DATA:  Toe injury. EXAM: RIGHT FOOT COMPLETE - 3+ VIEW COMPARISON:  Radiographs of May 14, 2012. FINDINGS: Moderately displaced fracture is seen involving the proximal portion of the first proximal phalanx with intra-articular extension with some callus formation suggesting subacute fracture. Minimally displaced fracture is seen involving the proximal portion of the second distal phalanx. Soft tissue irregularity or wound is seen involving the distal portion of the second toe. Mild posterior calcaneal spurring is noted. IMPRESSION: Probable subacute moderately displaced fracture is seen involving the first proximal phalanx with intra-articular extension. Probable minimally displaced acute fracture is seen involving proximal portion of the second distal phalanx. Overlying soft tissue irregularity or wound is seen involving the distal soft tissues of the second toe. Electronically Signed   By: Marijo Conception, M.D.   On: 02/25/2018 12:17    Mitchell Heir, Medical Student 02/26/2018, 9:19 AM [place] Intern pager: [phone], text pages welcome

## 2018-02-26 NOTE — Progress Notes (Signed)
Morning cbg 141, patient receives Novolog for meal coverage.  Day shift to follow up since breakfast is not here at this time.  Night shift handles sliding scale only. Thank you, Ivin Booty, RN

## 2018-02-26 NOTE — Progress Notes (Signed)
Pharmacy Antibiotic Note  Rachel Chaney is a 58 y.o. female admitted on 02/25/2018 with wound infection. Pharmacy has been consulted for vancomycin dosing. Patient missed HD yesterday, 2/17. Patient is getting session today. Usual schedule is MWF. Will follow patient's HD schedule closely.  Plan: Vancomycin 1000 mg IV with HD x1 today Follow for next HD session.  Height: 5\' 4"  (162.6 cm) Weight: 249 lb 1.9 oz (113 kg) IBW/kg (Calculated) : 54.7  Temp (24hrs), Avg:98.9 F (37.2 C), Min:98.4 F (36.9 C), Max:99.8 F (37.7 C)  Recent Labs  Lab 02/25/18 1105 02/26/18 0348  WBC 27.0* 29.3*  CREATININE 8.98* 9.62*    Estimated Creatinine Clearance: 7.8 mL/min (A) (by C-G formula based on SCr of 9.62 mg/dL (H)).    No Known Allergies  Antimicrobials this admission: Ceftriaxone 2/17 >>  Vancomycin 2/17 >>  Cefazolin x 1 in ED  Dose adjustments this admission: n/a  Microbiology results: n/a  Thank you for allowing pharmacy to be a part of this patient's care.  Patrina Levering, PharmD Candidate 02/26/2018 1:03 PM

## 2018-02-26 NOTE — Procedures (Signed)
   I was present at this dialysis session, have reviewed the session itself and made  appropriate changes Kelly Splinter MD [place] pager [phone]   02/26/2018, 4:14 PM

## 2018-02-26 NOTE — Progress Notes (Signed)
PT Cancellation Note  Patient Details Name: Rachel Chaney MRN: [id number] DOB: [birth year]   Cancelled Treatment:    Reason Eval/Treat Not Completed: Patient at procedure or test/unavailable(HD). Will check back tomorrow.    [name] L [name] 02/26/2018, 1:40 PM

## 2018-02-26 NOTE — Progress Notes (Signed)
Initial Nutrition Assessment  DOCUMENTATION CODES:   Obesity unspecified  INTERVENTION:    Pro-stat 30 ml PO BID, each supplement provides 100 kcal and 15 gm protein.  Renal MVI daily.  RD to provide renal diet education when patient available.   NUTRITION DIAGNOSIS:   Increased nutrient needs related to wound healing, chronic illness as evidenced by estimated needs.  GOAL:   Patient will meet greater than or equal to 90% of their needs  MONITOR:   PO intake, Supplement acceptance, Labs, Skin  REASON FOR ASSESSMENT:   Consult Assessment of nutrition requirement/status  ASSESSMENT:   58 yo female with PMH of DM, HLD, HTN, CHF, ESRD on HD who was admitted with osteomyelitis and abscess of R foot second and third toes.    Plans for surgery Wednesday, at least second and third ray amputation.   Patient not in room, she just left for HD. Spoke with her husband who reports patient usually eats very well, 3 meals per day, but does not follow a renal diet. He requests renal diet education handouts.   Labs reviewed. Sodium 133 (L), creatinine 9.62 (H), BUN 51 (H), corrected calcium 9.3 WNL CBG's: 141-103 Medications reviewed and include Rocaltrol, Colace, ferric citrate, novolog, levemir, senokot, IV vanc, IV rocephin.    NUTRITION - FOCUSED PHYSICAL EXAM:  Unable to complete  Diet Order:   Diet Order            Diet NPO time specified  Diet effective midnight        Diet renal/carb modified with fluid restriction Diet-HS Snack? Nothing; Fluid restriction: 1200 mL Fluid; Room service appropriate? Yes; Fluid consistency: Thin  Diet effective now              EDUCATION NEEDS:   Not appropriate for education at this time  Skin:  Skin Assessment: Skin Integrity Issues: Skin Integrity Issues:: Diabetic Ulcer Diabetic Ulcer: R foot abscess 2nd and 3rd toe   Last BM:  2/17  Height:   Ht Readings from Last 1 Encounters:  02/25/18 5\' 4"  (1.626 m)    Weight:    Wt Readings from Last 1 Encounters:  02/25/18 104.3 kg    Ideal Body Weight:  54.5 kg  BMI:  Body mass index is 39.48 kg/m.  Estimated Nutritional Needs:   Kcal:  2000-2200  Protein:  100-115 gm  Fluid:  UOP +1 L    Molli Barrows, RD, LDN, CNSC Pager [phone] After Hours Pager [phone]

## 2018-02-27 ENCOUNTER — Inpatient Hospital Stay (HOSPITAL_COMMUNITY): Payer: Medicare Other

## 2018-02-27 ENCOUNTER — Encounter (HOSPITAL_COMMUNITY)
Admission: EM | Disposition: A | Discharge status: Skilled Nursing Facility | Payer: Self-pay | Source: Home / Self Care | Attending: Family Medicine

## 2018-02-27 ENCOUNTER — Inpatient Hospital Stay (HOSPITAL_COMMUNITY): Payer: Medicare Other | Admitting: Anesthesiology

## 2018-02-27 ENCOUNTER — Encounter (HOSPITAL_COMMUNITY): Payer: Self-pay

## 2018-02-27 DIAGNOSIS — S92919B Unspecified fracture of unspecified toe(s), initial encounter for open fracture: Secondary | ICD-10-CM

## 2018-02-27 DIAGNOSIS — E1140 Type 2 diabetes mellitus with diabetic neuropathy, unspecified: Secondary | ICD-10-CM

## 2018-02-27 HISTORY — PX: AMPUTATION: SHX166

## 2018-02-27 LAB — CBC WITH DIFFERENTIAL/PLATELET
Abs Immature Granulocytes: 0 10*3/uL (ref 0.00–0.07)
Basophils Absolute: 0 10*3/uL (ref 0.0–0.1)
Basophils Relative: 0 %
Eosinophils Absolute: 0 10*3/uL (ref 0.0–0.5)
Eosinophils Relative: 0 %
HCT: 39.2 % (ref 36.0–46.0)
Hemoglobin: 11.9 g/dL — ABNORMAL LOW (ref 12.0–15.0)
Lymphocytes Relative: 3 %
Lymphs Abs: 1 10*3/uL (ref 0.7–4.0)
MCH: 31.9 pg (ref 26.0–34.0)
MCHC: 30.4 g/dL (ref 30.0–36.0)
MCV: 105.1 fL — ABNORMAL HIGH (ref 80.0–100.0)
Monocytes Absolute: 2.2 10*3/uL — ABNORMAL HIGH (ref 0.1–1.0)
Monocytes Relative: 7 %
Neutro Abs: 28.9 10*3/uL — ABNORMAL HIGH (ref 1.7–7.7)
Neutrophils Relative %: 90 %
Platelets: 299 10*3/uL (ref 150–400)
RBC: 3.73 MIL/uL — ABNORMAL LOW (ref 3.87–5.11)
RDW: 15.1 % (ref 11.5–15.5)
WBC: 32.1 10*3/uL — ABNORMAL HIGH (ref 4.0–10.5)
nRBC: 0 % (ref 0.0–0.2)
nRBC: 0 /100 WBC

## 2018-02-27 LAB — GLUCOSE, CAPILLARY
Glucose-Capillary: 149 mg/dL — ABNORMAL HIGH (ref 70–99)
Glucose-Capillary: 136 mg/dL — ABNORMAL HIGH (ref 70–99)
Glucose-Capillary: 134 mg/dL — ABNORMAL HIGH (ref 70–99)
Glucose-Capillary: 138 mg/dL — ABNORMAL HIGH (ref 70–99)
Glucose-Capillary: 125 mg/dL — ABNORMAL HIGH (ref 70–99)
Glucose-Capillary: 126 mg/dL — ABNORMAL HIGH (ref 70–99)
Glucose-Capillary: 180 mg/dL — ABNORMAL HIGH (ref 70–99)

## 2018-02-27 LAB — BASIC METABOLIC PANEL
Anion gap: 13 (ref 5–15)
BUN: 26 mg/dL — ABNORMAL HIGH (ref 6–20)
CO2: 28 mmol/L (ref 22–32)
Calcium: 8.5 mg/dL — ABNORMAL LOW (ref 8.9–10.3)
Chloride: 95 mmol/L — ABNORMAL LOW (ref 98–111)
Creatinine, Ser: 6.27 mg/dL — ABNORMAL HIGH (ref 0.44–1.00)
GFR calc Af Amer: 8 mL/min — ABNORMAL LOW (ref >60)
GFR calc non Af Amer: 7 mL/min — ABNORMAL LOW (ref >60)
Glucose, Bld: 147 mg/dL — ABNORMAL HIGH (ref 70–99)
Potassium: 4.5 mmol/L (ref 3.5–5.1)
Sodium: 136 mmol/L (ref 135–145)

## 2018-02-27 LAB — SURGICAL PCR SCREEN
MRSA, PCR: NEGATIVE
Staphylococcus aureus: POSITIVE — AB

## 2018-02-27 LAB — HEMOGLOBIN A1C
Hgb A1c MFr Bld: 11.4 % — ABNORMAL HIGH (ref 4.8–5.6)
Mean Plasma Glucose: 280 mg/dL

## 2018-02-27 SURGERY — AMPUTATION, FOOT, RAY
Anesthesia: General | Site: Foot | Laterality: Right

## 2018-02-27 MED ORDER — MAGNESIUM CITRATE PO SOLN: 1.0000 | Freq: Once | ORAL | Status: DC | PRN

## 2018-02-27 MED ORDER — ACETAMINOPHEN 160 MG/5ML PO SOLN: 325.0000 mg | ORAL | Status: DC | PRN

## 2018-02-27 MED ORDER — ACETAMINOPHEN 325 MG PO TABS: 325.0000 mg | ORAL_TABLET | ORAL | Status: DC | PRN

## 2018-02-27 MED ORDER — LIDOCAINE 2% (20 MG/ML) 5 ML SYRINGE
INTRAMUSCULAR | Status: DC | PRN
  Administered 2018-02-27: 100 mg via INTRAVENOUS

## 2018-02-27 MED ORDER — ONDANSETRON HCL 4 MG/2ML IJ SOLN: 4.0000 mg | Freq: Once | INTRAMUSCULAR | Status: DC | PRN

## 2018-02-27 MED ORDER — VANCOMYCIN HCL IN DEXTROSE 1-5 GM/200ML-% IV SOLN
1000.0000 mg | INTRAVENOUS | Status: DC
  Administered 2018-02-28: 1000 mg via INTRAVENOUS

## 2018-02-27 MED ORDER — POLYETHYLENE GLYCOL 3350 17 G PO PACK: 17.0000 g | PACK | Freq: Every day | ORAL | Status: DC | PRN

## 2018-02-27 MED ORDER — LIDOCAINE 2% (20 MG/ML) 5 ML SYRINGE
INTRAMUSCULAR | Status: AC
  Filled 2018-02-27: qty 5

## 2018-02-27 MED ORDER — OXYCODONE HCL 5 MG PO TABS: 5.0000 mg | ORAL_TABLET | ORAL | Status: DC | PRN

## 2018-02-27 MED ORDER — CEFAZOLIN SODIUM-DEXTROSE 2-4 GM/100ML-% IV SOLN
2.0000 g | INTRAVENOUS | Status: AC
  Administered 2018-02-27: 2 g via INTRAVENOUS
  Filled 2018-02-27: qty 100

## 2018-02-27 MED ORDER — DOCUSATE SODIUM 100 MG PO CAPS
100.0000 mg | ORAL_CAPSULE | Freq: Two times a day (BID) | ORAL | Status: DC
  Administered 2018-02-27 – 2018-03-02 (×4): 100 mg via ORAL
  Filled 2018-02-27 (×6): qty 1

## 2018-02-27 MED ORDER — FENTANYL CITRATE (PF) 250 MCG/5ML IJ SOLN
INTRAMUSCULAR | Status: AC
  Filled 2018-02-27: qty 5

## 2018-02-27 MED ORDER — CHLORHEXIDINE GLUCONATE CLOTH 2 % EX PADS
6.0000 | MEDICATED_PAD | Freq: Every day | CUTANEOUS | Status: DC
  Administered 2018-02-28: 6 via TOPICAL

## 2018-02-27 MED ORDER — HYDROMORPHONE HCL 1 MG/ML IJ SOLN: 0.5000 mg | INTRAMUSCULAR | Status: DC | PRN

## 2018-02-27 MED ORDER — FENTANYL CITRATE (PF) 100 MCG/2ML IJ SOLN: 25.0000 ug | INTRAMUSCULAR | Status: DC | PRN

## 2018-02-27 MED ORDER — MEPERIDINE HCL 50 MG/ML IJ SOLN: 6.2500 mg | INTRAMUSCULAR | Status: DC | PRN

## 2018-02-27 MED ORDER — ONDANSETRON HCL 4 MG/2ML IJ SOLN
INTRAMUSCULAR | Status: DC | PRN
  Administered 2018-02-27: 4 mg via INTRAVENOUS

## 2018-02-27 MED ORDER — METOCLOPRAMIDE HCL 5 MG/ML IJ SOLN: 5.0000 mg | Freq: Three times a day (TID) | INTRAMUSCULAR | Status: DC | PRN

## 2018-02-27 MED ORDER — METHOCARBAMOL 1000 MG/10ML IJ SOLN
500.0000 mg | Freq: Four times a day (QID) | INTRAVENOUS | Status: DC | PRN
  Filled 2018-02-27: qty 5

## 2018-02-27 MED ORDER — ONDANSETRON HCL 4 MG/2ML IJ SOLN: 4.0000 mg | Freq: Four times a day (QID) | INTRAMUSCULAR | Status: DC | PRN

## 2018-02-27 MED ORDER — EPHEDRINE SULFATE-NACL 50-0.9 MG/10ML-% IV SOSY
PREFILLED_SYRINGE | INTRAVENOUS | Status: DC | PRN
  Administered 2018-02-27: 15 mg via INTRAVENOUS
  Administered 2018-02-27: 10 mg via INTRAVENOUS

## 2018-02-27 MED ORDER — FENTANYL CITRATE (PF) 250 MCG/5ML IJ SOLN
INTRAMUSCULAR | Status: DC | PRN
  Administered 2018-02-27: 50 ug via INTRAVENOUS

## 2018-02-27 MED ORDER — SODIUM CHLORIDE 0.9 % IV SOLN
INTRAVENOUS | Status: DC | PRN
  Administered 2018-02-27: 15:00:00 via INTRAVENOUS

## 2018-02-27 MED ORDER — SODIUM CHLORIDE 0.9 % IV SOLN: INTRAVENOUS | Status: DC

## 2018-02-27 MED ORDER — OXYCODONE HCL 5 MG PO TABS: 5.0000 mg | ORAL_TABLET | Freq: Once | ORAL | Status: DC | PRN

## 2018-02-27 MED ORDER — EPHEDRINE 5 MG/ML INJ
INTRAVENOUS | Status: AC
  Filled 2018-02-27: qty 10

## 2018-02-27 MED ORDER — 0.9 % SODIUM CHLORIDE (POUR BTL) OPTIME
TOPICAL | Status: DC | PRN
  Administered 2018-02-27: 1000 mL

## 2018-02-27 MED ORDER — SUCCINYLCHOLINE CHLORIDE 20 MG/ML IJ SOLN
INTRAMUSCULAR | Status: DC | PRN
  Administered 2018-02-27: 200 mg via INTRAVENOUS

## 2018-02-27 MED ORDER — METOCLOPRAMIDE HCL 5 MG PO TABS
5.0000 mg | ORAL_TABLET | Freq: Three times a day (TID) | ORAL | Status: DC | PRN
  Filled 2018-02-27: qty 2

## 2018-02-27 MED ORDER — ONDANSETRON HCL 4 MG/2ML IJ SOLN
INTRAMUSCULAR | Status: AC
  Filled 2018-02-27: qty 2

## 2018-02-27 MED ORDER — ACETAMINOPHEN 325 MG PO TABS
325.0000 mg | ORAL_TABLET | Freq: Four times a day (QID) | ORAL | Status: DC | PRN
  Administered 2018-03-03 – 2018-03-08 (×10): 650 mg via ORAL
  Administered 2018-03-10: 325 mg via ORAL
  Administered 2018-03-10 – 2018-03-12 (×4): 650 mg via ORAL
  Administered 2018-03-12: 325 mg via ORAL
  Filled 2018-02-27 (×8): qty 2
  Filled 2018-02-27: qty 1
  Filled 2018-02-27 (×7): qty 2

## 2018-02-27 MED ORDER — PROPOFOL 10 MG/ML IV BOLUS
INTRAVENOUS | Status: DC | PRN
  Administered 2018-02-27: 100 mg via INTRAVENOUS

## 2018-02-27 MED ORDER — OXYCODONE HCL 5 MG/5ML PO SOLN: 5.0000 mg | Freq: Once | ORAL | Status: DC | PRN

## 2018-02-27 MED ORDER — OXYCODONE HCL 5 MG PO TABS
10.0000 mg | ORAL_TABLET | ORAL | Status: DC | PRN
  Administered 2018-02-28: 15 mg via ORAL
  Filled 2018-02-27: qty 3

## 2018-02-27 MED ORDER — CHLORHEXIDINE GLUCONATE 4 % EX LIQD
60.0000 mL | Freq: Once | CUTANEOUS | Status: AC
  Administered 2018-02-27: 4 via TOPICAL

## 2018-02-27 MED ORDER — CHLORHEXIDINE GLUCONATE CLOTH 2 % EX PADS
6.0000 | MEDICATED_PAD | Freq: Every day | CUTANEOUS | Status: DC
  Administered 2018-02-27: 6 via TOPICAL

## 2018-02-27 MED ORDER — MIDAZOLAM HCL 2 MG/2ML IJ SOLN
INTRAMUSCULAR | Status: DC | PRN
  Administered 2018-02-27: 1 mg via INTRAVENOUS

## 2018-02-27 MED ORDER — ONDANSETRON HCL 4 MG/2ML IJ SOLN
4.0000 mg | Freq: Once | INTRAMUSCULAR | Status: AC
  Administered 2018-02-27: 4 mg via INTRAVENOUS
  Filled 2018-02-27: qty 2

## 2018-02-27 MED ORDER — SODIUM CHLORIDE 0.9 % IV SOLN
INTRAVENOUS | Status: DC
  Administered 2018-02-27 – 2018-03-06 (×4): via INTRAVENOUS

## 2018-02-27 MED ORDER — ONDANSETRON HCL 4 MG PO TABS: 4.0000 mg | ORAL_TABLET | Freq: Four times a day (QID) | ORAL | Status: DC | PRN

## 2018-02-27 MED ORDER — MIDAZOLAM HCL 2 MG/2ML IJ SOLN
INTRAMUSCULAR | Status: AC
  Filled 2018-02-27: qty 2

## 2018-02-27 MED ORDER — BISACODYL 10 MG RE SUPP: 10.0000 mg | Freq: Every day | RECTAL | Status: DC | PRN

## 2018-02-27 MED ORDER — METHOCARBAMOL 500 MG PO TABS
500.0000 mg | ORAL_TABLET | Freq: Four times a day (QID) | ORAL | Status: DC | PRN
  Administered 2018-02-27 – 2018-02-28 (×2): 500 mg via ORAL
  Filled 2018-02-27 (×2): qty 1

## 2018-02-27 MED ORDER — PROPOFOL 10 MG/ML IV BOLUS
INTRAVENOUS | Status: AC
  Filled 2018-02-27: qty 20

## 2018-02-27 MED ORDER — MUPIROCIN 2 % EX OINT
1.0000 "application " | TOPICAL_OINTMENT | Freq: Two times a day (BID) | CUTANEOUS | Status: AC
  Administered 2018-02-27 – 2018-03-03 (×10): 1 via NASAL
  Filled 2018-02-27 (×2): qty 22

## 2018-02-27 SURGICAL SUPPLY — 39 items
BLADE SAW SGTL MED 73X18.5 STR (BLADE) IMPLANT
BLADE SURG 21 STRL SS (BLADE) ×3 IMPLANT
BNDG COHESIVE 3X5 TAN STRL LF (GAUZE/BANDAGES/DRESSINGS) ×2 IMPLANT
BNDG COHESIVE 4X5 TAN STRL (GAUZE/BANDAGES/DRESSINGS) ×3 IMPLANT
BNDG GAUZE ELAST 4 BULKY (GAUZE/BANDAGES/DRESSINGS) ×3 IMPLANT
CANISTER WOUNDNEG PRESSURE 500 (CANNISTER) ×2 IMPLANT
CHLORAPREP W/TINT 26ML (MISCELLANEOUS) ×2 IMPLANT
COVER SURGICAL LIGHT HANDLE (MISCELLANEOUS) ×4 IMPLANT
COVER WAND RF STERILE (DRAPES) ×3 IMPLANT
DRAPE INCISE IOBAN 66X45 STRL (DRAPES) ×2 IMPLANT
DRAPE U-SHAPE 47X51 STRL (DRAPES) ×6 IMPLANT
DRESSING PREVENA PLUS CUSTOM (GAUZE/BANDAGES/DRESSINGS) IMPLANT
DRSG ADAPTIC 3X8 NADH LF (GAUZE/BANDAGES/DRESSINGS) ×3 IMPLANT
DRSG PAD ABDOMINAL 8X10 ST (GAUZE/BANDAGES/DRESSINGS) ×6 IMPLANT
DRSG PREVENA PLUS CUSTOM (GAUZE/BANDAGES/DRESSINGS) ×3
DRSG VAC ATS MED SENSATRAC (GAUZE/BANDAGES/DRESSINGS) ×2 IMPLANT
DURAPREP 26ML APPLICATOR (WOUND CARE) ×1 IMPLANT
ELECT REM PT RETURN 9FT ADLT (ELECTROSURGICAL) ×3
ELECTRODE REM PT RTRN 9FT ADLT (ELECTROSURGICAL) ×1 IMPLANT
GAUZE SPONGE 4X4 12PLY STRL (GAUZE/BANDAGES/DRESSINGS) ×1 IMPLANT
GLOVE BIOGEL PI IND STRL 9 (GLOVE) ×1 IMPLANT
GLOVE BIOGEL PI INDICATOR 9 (GLOVE) ×2
GLOVE SURG ORTHO 9.0 STRL STRW (GLOVE) ×3 IMPLANT
GOWN STRL REUS W/ TWL XL LVL3 (GOWN DISPOSABLE) ×2 IMPLANT
GOWN STRL REUS W/TWL XL LVL3 (GOWN DISPOSABLE) ×4
KIT BASIN OR (CUSTOM PROCEDURE TRAY) ×3 IMPLANT
KIT DRSG PREVENA PLUS 7DAY 125 (MISCELLANEOUS) ×2 IMPLANT
KIT TURNOVER KIT B (KITS) ×3 IMPLANT
NS IRRIG 1000ML POUR BTL (IV SOLUTION) ×3 IMPLANT
PACK ORTHO EXTREMITY (CUSTOM PROCEDURE TRAY) ×3 IMPLANT
PAD ARMBOARD 7.5X6 YLW CONV (MISCELLANEOUS) ×6 IMPLANT
STOCKINETTE IMPERVIOUS LG (DRAPES) IMPLANT
SUT ETHILON 2 0 PSLX (SUTURE) ×5 IMPLANT
SWAB COLLECTION DEVICE MRSA (MISCELLANEOUS) ×2 IMPLANT
SWAB CULTURE ESWAB REG 1ML (MISCELLANEOUS) ×2 IMPLANT
TOWEL OR 17X26 10 PK STRL BLUE (TOWEL DISPOSABLE) ×3 IMPLANT
TUBE CONNECTING 12'X1/4 (SUCTIONS) ×1
TUBE CONNECTING 12X1/4 (SUCTIONS) ×2 IMPLANT
YANKAUER SUCT BULB TIP NO VENT (SUCTIONS) ×3 IMPLANT

## 2018-02-27 NOTE — Transfer of Care (Signed)
Immediate Anesthesia Transfer of Care Note  Patient: Rachel Chaney  Procedure(s) Performed: RIGHT FOOT 2ND AND 3RD RAY AMPUTATION (Right Foot)  Patient Location: PACU  Anesthesia Type:General  Level of Consciousness: awake and oriented  Airway & Oxygen Therapy: Patient Spontanous Breathing, non-rebreather face mask and called Dr. Ambrose Pancoast to assess in PACU  Post-op Assessment: Report given to RN  Post vital signs: Reviewed and unstable  Last Vitals:  Vitals Value Taken Time  BP 161/97 02/27/2018  4:04 PM  Temp    Pulse 75 02/27/2018  4:07 PM  Resp 24 02/27/2018  4:07 PM  SpO2 93 % 02/27/2018  4:07 PM  Vitals shown include unvalidated device data.  Last Pain:  Vitals:   02/27/18 1224  TempSrc: Oral  PainSc:       Patients Stated Pain Goal: 0 (31/42/76 7011)  Complications: respiratory complications

## 2018-02-27 NOTE — Progress Notes (Signed)
Nutrition Follow-up  DOCUMENTATION CODES:   Obesity unspecified  INTERVENTION:    Diet education (DM/Renal) provided. Patient not very receptive.  Continue Pro-stat 30 ml BID to maximize protein intake to support wound healing; each supplement provides 100 kcal and 15 gm protein.  NUTRITION DIAGNOSIS:   Increased nutrient needs related to wound healing, chronic illness as evidenced by estimated needs.  Ongoing   GOAL:   Patient will meet greater than or equal to 90% of their needs  Progressing   MONITOR:   PO intake, Supplement acceptance, Labs, Skin  ASSESSMENT:   58 yo female with PMH of DM, HLD, HTN, CHF, ESRD on HD who was admitted with osteomyelitis and abscess of R foot second and third toes.   Patient NPO this morning for surgery today--R foot 2nd and 3rd ray amputation.   Discussed renal & CHO modified diet recommendations with patient. Provided Renal Pyramid handout; discussed high potassium, sodium, and phosphorus foods to limit/avoid. Encouraged patient to have protein with each meal. Good sources of protein discussed with patient. Patient was not very receptive to discussing diet recommendations. She said she is taking the Pro-stat supplement.  Labs reviewed. Creatinine 6.27 (H), corrected calcium 9.62 WNL CBG's: 149-136 Medications reviewed and include calcitriol, colace, novolog, levemir, Rena-vit, IV antibiotics.   NUTRITION - FOCUSED PHYSICAL EXAM:    Most Recent Value  Orbital Region  No depletion  Upper Arm Region  No depletion  Thoracic and Lumbar Region  No depletion  Buccal Region  No depletion  Temple Region  No depletion  Clavicle Bone Region  No depletion  Clavicle and Acromion Bone Region  No depletion  Scapular Bone Region  No depletion  Dorsal Hand  No depletion  Patellar Region  No depletion  Anterior Thigh Region  No depletion  Posterior Calf Region  No depletion  Edema (RD Assessment)  Mild  Hair  Reviewed  Eyes  Reviewed   Mouth  Reviewed  Skin  Reviewed  Nails  Reviewed       Diet Order:   Diet Order            Diet NPO time specified  Diet effective midnight        Diet NPO time specified  Diet effective midnight              EDUCATION NEEDS:   Education needs have been addressed  Skin:  Skin Assessment: Skin Integrity Issues: Skin Integrity Issues:: Diabetic Ulcer Diabetic Ulcer: R foot abscess 2nd and 3rd toe   Last BM:  2/19  Height:   Ht Readings from Last 1 Encounters:  02/25/18 5\' 4"  (1.626 m)    Weight:   Wt Readings from Last 1 Encounters:  02/26/18 109.6 kg   02/25/18 104.3 kg (admit weight)    Ideal Body Weight:  54.5 kg  BMI:  39.5 using admit weight  Estimated Nutritional Needs:   Kcal:  2000-2200  Protein:  100-115 gm  Fluid:  UOP +1 L    Molli Barrows, RD, LDN, [place] Pager [phone] After Hours Pager [phone]

## 2018-02-27 NOTE — Progress Notes (Signed)
[facility] Progress Note  Subjective: is in pacu postop toe amp x 2, no c/o's.    Vitals:   02/27/18 1605 02/27/18 1615 02/27/18 1620 02/27/18 1630  BP: (!) 161/97  132/67   Pulse: 79 72 72 71  Resp: 17 18 20 16   Temp: 98.1 F (36.7 C)     TempSrc:      SpO2: (!) 88% 94% (!) 89% 92%  Weight:      Height:        Inpatient medications: . [MAR Hold] atorvastatin  80 mg Oral Daily  . [MAR Hold] calcitRIOL  0.25 mcg Oral Daily  . [MAR Hold] carvedilol  25 mg Oral BID WC  . [MAR Hold] Chlorhexidine Gluconate Cloth  6 each Topical Q0600  . [MAR Hold] Chlorhexidine Gluconate Cloth  6 each Topical Daily  . [MAR Hold] docusate sodium  100 mg Oral BID  . [MAR Hold] feeding supplement (PRO-STAT SUGAR FREE 64)  30 mL Oral BID  . [MAR Hold] ferric citrate  420 mg Oral TID WC  . [MAR Hold] hydrALAZINE  50 mg Oral TID  . [MAR Hold] insulin aspart  0-15 Units Subcutaneous TID WC  . [MAR Hold] insulin detemir  25 Units Subcutaneous BID  . [MAR Hold] multivitamin  1 tablet Oral QHS  . [MAR Hold] mupirocin ointment  1 application Nasal BID  . [MAR Hold] senna  1 tablet Oral BID   . sodium chloride    . [MAR Hold] cefTRIAXone (ROCEPHIN)  IV 2 g (02/26/18 1724)  . [MAR Hold] vancomycin     acetaminophen **OR** acetaminophen (TYLENOL) oral liquid 160 mg/5 mL, [MAR Hold] acetaminophen **OR** [MAR Hold] acetaminophen, [MAR Hold] alum & mag hydroxide-simeth, fentaNYL (SUBLIMAZE) injection, [MAR Hold] guaiFENesin-dextromethorphan, meperidine (DEMEROL) injection, ondansetron (ZOFRAN) IV, [MAR Hold] oxyCODONE, oxyCODONE **OR** oxyCODONE  Iron/TIBC/Ferritin/ %Sat    Component Value Date/Time   IRON 32 04/19/2015 1525   IRON 55 09/11/2013 1138   TIBC 309 04/19/2015 1525   TIBC 271 09/11/2013 1138   FERRITIN 172 04/19/2015 1525   FERRITIN 106 09/11/2013 1138   IRONPCTSAT 10 (L) 04/19/2015 1525   IRONPCTSAT 20 (L) 09/11/2013 1138    Exam: General:WDWN NAD Head:NCAT,sclera not  icteric, EOM intact Neck: Supple. No lymphadenopathy Lungs: CTA bilaterally. No wheeze, rales or rhonchi. Breathing is unlabored. Heart:RRR. No murmur, rubs or gallops.  Abdomen: soft, nontender, +BS, no guarding, no rebound tenderness Lower extremities: trace edema b/l lower extremities,R foot bandaged Neuro: AAOx3. Moves all extremities spontaneously. Psych: Responds to questions appropriately with a normal affect. Dialysis Access:LUE AVF, + thrill and bruit  Dialysis Orders:  MWF- [facility] 4:00 hrs, BFR400, CBJ628, EDW 109.0,2K/2Ca Access:LUE AVF Heparin: 8000 unit IV bolus, 2000 unit IV intermittent bolus Mircera: None Calcitriol: 1.55mcg PO qHD  Last Labs:01/28/2018:Hgb 12.1, TSAT30.0, K4.7, Ca8.2, P6.3, PTH1028.0, Alb3.6.  Assessment/Plan: 1. Osteomyelitis and ulcerationofright foot second toe and third toe: On ceftriaxone and vancomycin. SP 2nd and 3rd toe amp today 2/19.  2. ESRD- had HD yest off schedule. Will do next HD tomorrow then resume MWF schedule.  3. Hypertension/volume- BP adequately controlled. At dry wt.  4. Anemiaof CKD- Hgb 10.8, runs in the 12's as outpatient. Suspect some blood loss with injury. Follow trend. 5. Secondary Hyperparathyroidism -Managed as outpatient. Continue calcitriol 1.73mcg 3 times weekly during dialysis. Continue auryxia. 6. Nutrition- Albumin 2.6. Will start protein supplementation. 7. T2DM: Last A1c 9.1% on 08/23/17. History of severe neuropathy in lower extremities.Insulin per primary team.  [facility] 02/27/2018, 4:39 PM  Recent Labs  Lab 02/25/18 1105 02/26/18 0348 02/27/18 0801  NA 135 133* 136  K 3.7 3.9 4.5  CL 90* 90* 95*  CO2 30 27 28   GLUCOSE 69* 162* 147*  BUN 44* 51* 26*  CREATININE 8.98* 9.62* 6.27*  CALCIUM 8.6* 8.2* 8.5*  ALBUMIN 2.6*  --   --    Recent Labs  Lab 02/25/18 1105  AST 15  ALT 18  ALKPHOS 85  BILITOT 0.6  PROT 6.9   Recent  Labs  Lab 02/25/18 1105 02/26/18 0348 02/27/18 0801  WBC 27.0* 29.3* 32.1*  NEUTROABS 22.7*  --  28.9*  HGB 11.1* 10.8* 11.9*  HCT 36.9 35.0* 39.2  MCV 103.1* 102.0* 105.1*  PLT 247 254 299

## 2018-02-27 NOTE — Anesthesia Procedure Notes (Signed)
Procedure Name: Intubation Date/Time: 02/27/2018 3:15 PM Performed by: Barrington Ellison, CRNA Pre-anesthesia Checklist: Patient identified, Emergency Drugs available, Suction available and Patient being monitored Patient Re-evaluated:Patient Re-evaluated prior to induction Oxygen Delivery Method: Circle System Utilized Preoxygenation: Pre-oxygenation with 100% oxygen Induction Type: IV induction Ventilation: Mask ventilation without difficulty Laryngoscope Size: Mac and 3 Grade View: Grade I Tube type: Oral Tube size: 7.0 mm Number of attempts: 2 Airway Equipment and Method: Stylet and Oral airway Placement Confirmation: ETT inserted through vocal cords under direct vision,  positive ETCO2 and breath sounds checked- equal and bilateral Secured at: 21 cm Tube secured with: Tape Dental Injury: Teeth and Oropharynx as per pre-operative assessment  Comments: First attempt with LMA 4- unable to seat LMA well, large leak and unable to administer sufficient TV, converted to ETT

## 2018-02-27 NOTE — Anesthesia Preprocedure Evaluation (Addendum)
Anesthesia Evaluation  Patient identified by MRN, date of birth, ID band Patient awake    Reviewed: Allergy & Precautions, H&P , NPO status , Patient's Chart, lab work & pertinent test results, reviewed documented beta blocker date and time   Airway Mallampati: II  TM Distance: >3 FB Neck ROM: full    Dental  (+) Edentulous Upper, Edentulous Lower, Upper Dentures, Lower Dentures, Dental Advidsory Given   Pulmonary shortness of breath and with exertion,    Pulmonary exam normal        Cardiovascular hypertension, On Medications, Pt. on medications and Pt. on home beta blockers +CHF   Rhythm:regular Rate:Normal     Neuro/Psych    GI/Hepatic GERD  Medicated and Controlled,  Endo/Other  diabetes, Type 1, Insulin Dependent  Renal/GU ESRF and DialysisRenal disease     Musculoskeletal   Abdominal   Peds  Hematology  (+) Blood dyscrasia, anemia ,   Anesthesia Other Findings   Reproductive/Obstetrics                             Lab Results  Component Value Date   WBC 32.1 (H) 02/27/2018   HGB 11.9 (L) 02/27/2018   HCT 39.2 02/27/2018   MCV 105.1 (H) 02/27/2018   PLT 299 02/27/2018   Lab Results  Component Value Date   CREATININE 6.27 (H) 02/27/2018   BUN 26 (H) 02/27/2018   NA 136 02/27/2018   K 4.5 02/27/2018   CL 95 (L) 02/27/2018   CO2 28 02/27/2018    Anesthesia Physical  Anesthesia Plan  ASA: III  Anesthesia Plan: General   Post-op Pain Management:    Induction: Intravenous  PONV Risk Score and Plan: 3 and Treatment may vary due to age or medical condition, Ondansetron and Midazolam  Airway Management Planned: Oral ETT and LMA  Additional Equipment:   Intra-op Plan:   Post-operative Plan: Extubation in OR  Informed Consent: I have reviewed the patients History and Physical, chart, labs and discussed the procedure including the risks, benefits and alternatives for  the proposed anesthesia with the patient or authorized representative who has indicated his/her understanding and acceptance.     Dental Advisory Given  Plan Discussed with: CRNA, Surgeon and Anesthesiologist  Anesthesia Plan Comments: ( )       Anesthesia Quick Evaluation

## 2018-02-27 NOTE — Interval H&P Note (Signed)
History and Physical Interval Note:  02/27/2018 8:00 AM  Rachel Chaney  has presented today for surgery, with the diagnosis of Abscess Osteomyelitis Right 2nd and 3rd Toe  The various methods of treatment have been discussed with the patient and family. After consideration of risks, benefits and other options for treatment, the patient has consented to  Procedure(s): RIGHT FOOT 2ND AND 3RD RAY AMPUTATION (Right) as a surgical intervention .  The patient's history has been reviewed, patient examined, no change in status, stable for surgery.  I have reviewed the patient's chart and labs.  Questions were answered to the patient's satisfaction.     [name]

## 2018-02-27 NOTE — Progress Notes (Signed)
PT Cancellation Note  Patient Details Name: Rachel Chaney MRN: [id number] DOB: 01/24/1960   Cancelled Treatment:    Reason Eval/Treat Not Completed: Other (comment)   Noted plans for surgery today;   Will follow up for PT eval postop;  Rachel Chaney, [place]  Acute Rehabilitation Services Pager [phone] Office [phone]    Rachel Chaney 02/27/2018, 8:13 AM

## 2018-02-27 NOTE — Progress Notes (Addendum)
Family Medicine Teaching Service Daily Progress Note Intern Pager: [phone]  Patient name: Rachel Chaney Medical record number: [id number] Date of birth: [birth year] Age: 58 y.o. Gender: female  Primary Care Provider: Bufford Lope, DO Consultants: Orthopedics, Nephrology Code Status: Full code  Pt Overview and Major Events to Date:  Rachel Chaney is a 58 y.o. female presenting with painful 2nd toe of R foot. PMH is significant for ESRD on HD MWF, T2DM, HTN, GERD, CHF, and dyslipidemia.  Assessment and Plan:  #Right second toe fracture OPEN, soft tissue infection, concerning for osteomyelitis: Patient with R second toe erythema, edema and large ulcer draining necrotic purulent material in the setting of R second toe fracture and uncontrolled diabetes. Patient is without signs of systemic infection, has remained afebrile, VSS. WBC increased to 32.1 today.  S/p 1g Ancef in ED. Gustilio-Anderson open fracture scale suggesting Type III fracture warranting broad spectrum antibiotics. Treating for presumed osteomyelitis and diabetic foot wound. Plan for R second toe amputation today. ABIs with prelim results demonstrating noncomperssible lower extremity arteries bilaterally, unable to obtain TBI. - Ortho consulted, plan to amputate second toe 2/19, appreciate recs - NPO since midnight, heparin stopped, will restart post-surgery - Continue CTX 2 g daily - Continue Vanc 3 g daily, only 1 g with dialysis - Consider broadening to pseudomonal coverage if symptoms worsen, would switch CTX to cefepime or ciprofloxacin - Pain controlled with Tylenol and oxycodone - Ambulate with assistance - Vitals per routine - c/s PT after surgery  #Episode of non-responsiveness: Unclear if true syncope or pre-syncope. Multiple possible etiologies include dehydration (pt reports feeling hot and sweaty and has been NPO), vasovagal given it occurred after a large BM when patient stood up, or cardiac given patient's  history of grade 1 diastolic dysfunction and persistent low diastolic BP. However, EKG prior to the event with normal sinus rhythm and QTc 470. VSS during event and CGB 149.  - F/U orthostatic BPs  #End-stage renal disease on HD: Receives hemodialysis MWF at [facility], last HD on Friday, missed session on day of admission, 2/17. Had make up dialysis session on 2/18.  Is still producing urine. K and bicarb wnl. - Dialysis per Nephro - Nephro consulted, appreciate recs - Renal/carb modified diet  #T2DM: HbA1c 11.4% 02/26/18.  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  She denies vision loss and polydipsia.  She reports retinopathy, polyphagia, and polyuria.  Patient reports CBGs between 120-140. Home regimen is 50 units Levemir twice daily. FBG 136 this AM. - NPO at midnight tonight for surgery today - Continue Levemir 25 U, will increase back to home dose after surgery when pt is eating - Moderate SSI - CBG monitoring 4 times daily  #HTN: Patient with low diastolic BPs ranging 25-95 since admission.  Home regimen includes hydralazine and carvedilol.  - Continue home dose hydralazine 50 mg and carvedilol 25 mg  - Nephrology following, appreciate recs  #HFpEF: Last echo 04/12/2015 with Grade 1 diastolic dysfunction, EF 63-87%. Patient with 1+ peripheral edema on exam but no crackles. Does not take diuretics at home. - Continue home carvedilol  #Hypoalbuminemia Albumin 2.6 on admission. No evidence of liver disease. Urinalysis from 2017 with >300 protein indicating that there is likely a nephrotic syndrome component to her hypoalbuminemia. Protein calorie malnutrition given poor diet could also be contributing. - Pro-Stat and renal MVI supplementation per Nutrition , appreciate recs - Nephro following, appreciate recs  #Anemia of CKD:  Hgb 10.8  on admission. Now back up to 11.9 (baseline in the 12's as outpatient). Per nephro, suspect some blood loss with injury.  -  Daily CBCs  #Hyperlipidemia: Most recent lipid panel April 2019 showing low cholesterol at 94, low HDL at 32, and other values within normal limits. - Continue atorvastatin 80 mg  #GERD: Patient asymptomatic at this time, usually takes Tums at home. -Monitor for acid reflux symptoms.  FEN/GI: Renal/carb modified diet, NPO at midnight PPx: Heparin  Disposition: Admit to med surg  Subjective:  Rachel Chaney had an episode of non-responsiveness overnight. Per report, she had a large BM and was being helped into bed when her eyes rolled back into her head and she briefly became unresponsive. A rapid response was called. Her vitals were wnl and CBG 149. EKG with normal sinus rhythm. She was A&Ox4 within seconds of the event.  This morning she states that she felt sweaty and hot prior to the event. Nothing like this has ever happened to her before. She denies feeling anxious or nervous about the surgery today. No complaints other than would like to eat.  Objective: Temp:  [98.3 F (36.8 C)-99.5 F (37.5 C)] 99.5 F (37.5 C) (02/19 0422) Pulse Rate:  [70-89] 76 (02/19 0833) Resp:  [16-18] 18 (02/18 2051) BP: (98-159)/(32-83) 125/57 (02/19 0833) SpO2:  [93 %-100 %] 100 % (02/19 0422) Weight:  [109.6 kg-113 kg] 109.6 kg (02/18 1628)   Physical Exam: General: anxious-appearing, obese female, NAD Cardiovascular: RRR Respiratory: CTAB, normal WOB Abdomen: soft, nontender Extremities: 1+ peripheral edema. R foot with bandage in place, unable to visualize distal toe necrosis.  Laboratory: Recent Labs  Lab 02/25/18 1105 02/26/18 0348 02/27/18 0801  WBC 27.0* 29.3* 32.1*  HGB 11.1* 10.8* 11.9*  HCT 36.9 35.0* 39.2  PLT 247 254 299   Recent Labs  Lab 02/25/18 1105 02/26/18 0348  NA 135 133*  K 3.7 3.9  CL 90* 90*  CO2 30 27  BUN 44* 51*  CREATININE 8.98* 9.62*  CALCIUM 8.6* 8.2*  PROT 6.9  --   BILITOT 0.6  --   ALKPHOS 85  --   ALT 18  --   AST 15  --   GLUCOSE 69* 162*      Imaging/Diagnostic Tests: Dg Foot Complete Right  Result Date: 02/25/2018 CLINICAL DATA:  Toe injury. EXAM: RIGHT FOOT COMPLETE - 3+ VIEW COMPARISON:  Radiographs of May 14, 2012. FINDINGS: Moderately displaced fracture is seen involving the proximal portion of the first proximal phalanx with intra-articular extension with some callus formation suggesting subacute fracture. Minimally displaced fracture is seen involving the proximal portion of the second distal phalanx. Soft tissue irregularity or wound is seen involving the distal portion of the second toe. Mild posterior calcaneal spurring is noted. IMPRESSION: Probable subacute moderately displaced fracture is seen involving the first proximal phalanx with intra-articular extension. Probable minimally displaced acute fracture is seen involving proximal portion of the second distal phalanx. Overlying soft tissue irregularity or wound is seen involving the distal soft tissues of the second toe. Electronically Signed   By: Marijo Conception, M.D.   On: 02/25/2018 12:17    Mitchell Heir, Medical Student 02/27/2018, 8:56 AM [place] Intern pager: [phone], text pages welcome  I have interviewed and examined the patient.  I have discussed the case and verified the key findings with MSIV Minish.   I agree with their assessments and plans as documented in their note for today.

## 2018-02-27 NOTE — Progress Notes (Signed)
Nurse tech called staff to room.  Patient had her eyes open but was not responding to staff. Then she came out of it and started responding appropriately after a few seconds. Vitals were normal and CBG 149.  Rapid Response came and assessed patient.  Patient is fully back to baseline.  She stated she just felt after having bowel movement like she was going to sleep.  On call MD at bedside currently.

## 2018-02-27 NOTE — Progress Notes (Signed)
FPTS Interim Progress Note  S: Paged by nurse - nausea and small episode emesis   A/P: Presurgical EKG obtained at 0130 today, QTc of 479ms.   Order 4mg  IV Zofran x 1  Wilber Oliphant, MD 02/27/2018, 2:19 AM PGY-1, [place] Medicine Service pager [phone]

## 2018-02-27 NOTE — Progress Notes (Signed)
Orthopedic Tech Progress Note Patient Details:  Rachel Chaney August 07, 1960 [id number]  Ortho Devices Type of Ortho Device: Postop shoe/boot Ortho Device/Splint Location: right Ortho Device/Splint Interventions: Adjustment, Application, Ordered   Post Interventions Patient Tolerated: Well Instructions Provided: Care of device, Adjustment of device   [name] 02/27/2018, 6:45 PM

## 2018-02-27 NOTE — Op Note (Signed)
02/27/2018  3:56 PM  PATIENT:  Rachel Chaney    PRE-OPERATIVE DIAGNOSIS:  Abscess Osteomyelitis Right 2nd and 3rd Toe  POST-OPERATIVE DIAGNOSIS:  Same  PROCEDURE:  RIGHT FOOT 2ND AND 3RD RAY AMPUTATION Local tissue rearrangement for wound closure 9 x 3 cm. Application of incisional wound VAC Praveena customizable  Cultures obtained x2.  SURGEON:  [name], MD  PHYSICIAN ASSISTANT:None ANESTHESIA:   General  PREOPERATIVE INDICATIONS:  Rachel Chaney is a  58 y.o. female with a diagnosis of Abscess Osteomyelitis Right 2nd and 3rd Toe who failed conservative measures and elected for surgical management.    The risks benefits and alternatives were discussed with the patient preoperatively including but not limited to the risks of infection, bleeding, nerve injury, cardiopulmonary complications, the need for revision surgery, among others, and the patient was willing to proceed.  OPERATIVE IMPLANTS: Praveena customizable  @ENCIMAGES @  OPERATIVE FINDINGS: Large purulent abscess extending to the midfoot.  Cultures obtained x2.  OPERATIVE PROCEDURE: Patient was brought the operating room and underwent a general anesthetic.  After adequate levels anesthesia were obtained patient's right lower extremity was prepped using ChloraPrep draped into a sterile field a timeout was called.  AV incision was made around the second and third toes that were both necrotic with abscess extending down to bone.  The second and third metatarsals were resected through the base and the necrotic tissue and bone was resected in one block of tissue.  Electrocautery was used for hemostasis.  Patient had a large abscess in the plantar aspect of her foot which required further extensive debridement of skin soft tissue muscle and fascia and tendons.  These were excised sharply with a rondure and a knife.  After healthy margins were obtained the wound was irrigated with normal saline.  Local tissue rearrangement  was used to close the wounds that w will foot cultures are finalized there was a large deep abscess as a combined 9 x 3 cm.  Incision was closed using 2-0 nylon.  A Praveena VAC dressing was applied this had a good suction fit patient was extubated taken the PACU in stable condition   DISCHARGE PLANNING:  Antibiotic duration: Patient will need to continue IV antibiotics until cultures are finalized.  There was a large deep abscess.  Weightbearing: Nonweightbearing on the right  Pain medication: High-dose opioid pathway  Dressing care/ Wound VAC: Continue wound VAC for 1 week  Ambulatory devices: Walker  Discharge to:[facility]  Discharge pending physical therapy recommendations.  Follow-up: In the office 1 week post operative.

## 2018-02-27 NOTE — Plan of Care (Signed)

## 2018-02-27 NOTE — Progress Notes (Signed)
ABI's have been completed. Preliminary results can be found in CV Proc through chart review.   02/27/18 11:39 AM Rachel Chaney RVT

## 2018-02-27 NOTE — Significant Event (Signed)
Rapid Response Event Note  Overview: Neurologic - " Unresponsive" - Brief  Initial Focused Assessment: Called by RNs about patient having a brief episode of being "unresponsive". Per RN, patient was getting cleaned up, had a large BM, and was sitting up on the side of bed, while sitting, per staff, her eyes rolled back. She came to and was quickly back to her baseline. When I arrived, patient was alert and oriented x 4, able to follow all of my commands, no focal neuro changes that I saw, no sensory loss. VS and blood sugar were are normal. Per patient and nurse, patient is at her baseline. Patient did endorse that she felt a little lightheaded while on the side of the bed.   Interventions: -- NO RRT INTERVENTIONS  Plan of Care: -- I paged FMTS MD at [phone] and I updated Dr. Maudie Mercury. MD will come see the patient as well.  Event Summary:    at    Call Time 0415 Arrival Time 0418  End Time 0440  Rachel Chaney R

## 2018-02-28 ENCOUNTER — Inpatient Hospital Stay (HOSPITAL_COMMUNITY): Payer: Medicare Other

## 2018-02-28 ENCOUNTER — Encounter (HOSPITAL_COMMUNITY): Payer: Self-pay | Admitting: Orthopedic Surgery

## 2018-02-28 ENCOUNTER — Inpatient Hospital Stay (HOSPITAL_COMMUNITY): Payer: Medicare Other | Admitting: Certified Registered Nurse Anesthetist

## 2018-02-28 DIAGNOSIS — I469 Cardiac arrest, cause unspecified: Secondary | ICD-10-CM

## 2018-02-28 DIAGNOSIS — J9601 Acute respiratory failure with hypoxia: Secondary | ICD-10-CM

## 2018-02-28 DIAGNOSIS — I82409 Acute embolism and thrombosis of unspecified deep veins of unspecified lower extremity: Secondary | ICD-10-CM

## 2018-02-28 HISTORY — DX: Cardiac arrest, cause unspecified: I46.9

## 2018-02-28 LAB — COMPREHENSIVE METABOLIC PANEL
ALT: 96 U/L — ABNORMAL HIGH (ref 0–44)
AST: 145 U/L — ABNORMAL HIGH (ref 15–41)
Albumin: 2.1 g/dL — ABNORMAL LOW (ref 3.5–5.0)
Alkaline Phosphatase: 173 U/L — ABNORMAL HIGH (ref 38–126)
Anion gap: 13 (ref 5–15)
BUN: 20 mg/dL (ref 6–20)
CO2: 27 mmol/L (ref 22–32)
Calcium: 8.1 mg/dL — ABNORMAL LOW (ref 8.9–10.3)
Chloride: 96 mmol/L — ABNORMAL LOW (ref 98–111)
Creatinine, Ser: 5.28 mg/dL — ABNORMAL HIGH (ref 0.44–1.00)
GFR calc Af Amer: 10 mL/min — ABNORMAL LOW (ref >60)
GFR calc non Af Amer: 8 mL/min — ABNORMAL LOW (ref >60)
Glucose, Bld: 217 mg/dL — ABNORMAL HIGH (ref 70–99)
Potassium: 4.1 mmol/L (ref 3.5–5.1)
Sodium: 136 mmol/L (ref 135–145)
Total Bilirubin: 1.4 mg/dL — ABNORMAL HIGH (ref 0.3–1.2)
Total Protein: 6.6 g/dL (ref 6.5–8.1)

## 2018-02-28 LAB — RENAL FUNCTION PANEL
Albumin: 2.3 g/dL — ABNORMAL LOW (ref 3.5–5.0)
Anion gap: 17 — ABNORMAL HIGH (ref 5–15)
BUN: 40 mg/dL — ABNORMAL HIGH (ref 6–20)
CO2: 25 mmol/L (ref 22–32)
Calcium: 8.3 mg/dL — ABNORMAL LOW (ref 8.9–10.3)
Chloride: 91 mmol/L — ABNORMAL LOW (ref 98–111)
Creatinine, Ser: 7.84 mg/dL — ABNORMAL HIGH (ref 0.44–1.00)
GFR calc Af Amer: 6 mL/min — ABNORMAL LOW (ref >60)
GFR calc non Af Amer: 5 mL/min — ABNORMAL LOW (ref >60)
Glucose, Bld: 198 mg/dL — ABNORMAL HIGH (ref 70–99)
Phosphorus: 7.9 mg/dL — ABNORMAL HIGH (ref 2.5–4.6)
Potassium: 4.4 mmol/L (ref 3.5–5.1)
Sodium: 133 mmol/L — ABNORMAL LOW (ref 135–145)

## 2018-02-28 LAB — BASIC METABOLIC PANEL
Anion gap: 19 — ABNORMAL HIGH (ref 5–15)
BUN: 39 mg/dL — ABNORMAL HIGH (ref 6–20)
CO2: 20 mmol/L — ABNORMAL LOW (ref 22–32)
Calcium: 8.4 mg/dL — ABNORMAL LOW (ref 8.9–10.3)
Chloride: 96 mmol/L — ABNORMAL LOW (ref 98–111)
Creatinine, Ser: 7.97 mg/dL — ABNORMAL HIGH (ref 0.44–1.00)
GFR calc Af Amer: 6 mL/min — ABNORMAL LOW (ref >60)
GFR calc non Af Amer: 5 mL/min — ABNORMAL LOW (ref >60)
Glucose, Bld: 212 mg/dL — ABNORMAL HIGH (ref 70–99)
Potassium: 4.6 mmol/L (ref 3.5–5.1)
Sodium: 135 mmol/L (ref 135–145)

## 2018-02-28 LAB — LACTIC ACID, PLASMA: Lactic Acid, Venous: 0.8 mmol/L (ref 0.5–1.9)

## 2018-02-28 LAB — GLUCOSE, CAPILLARY
Glucose-Capillary: 118 mg/dL — ABNORMAL HIGH (ref 70–99)
Glucose-Capillary: 119 mg/dL — ABNORMAL HIGH (ref 70–99)
Glucose-Capillary: 147 mg/dL — ABNORMAL HIGH (ref 70–99)
Glucose-Capillary: 196 mg/dL — ABNORMAL HIGH (ref 70–99)
Glucose-Capillary: 213 mg/dL — ABNORMAL HIGH (ref 70–99)
Glucose-Capillary: 203 mg/dL — ABNORMAL HIGH (ref 70–99)

## 2018-02-28 LAB — POCT I-STAT 7, (LYTES, BLD GAS, ICA,H+H)
Acid-Base Excess: 2 mmol/L (ref 0.0–2.0)
Bicarbonate: 29.1 mmol/L — ABNORMAL HIGH (ref 20.0–28.0)
Calcium, Ion: 1.05 mmol/L — ABNORMAL LOW (ref 1.15–1.40)
HCT: 37 % (ref 36.0–46.0)
Hemoglobin: 12.6 g/dL (ref 12.0–15.0)
O2 Saturation: 98 %
Patient temperature: 99.3
Potassium: 3.9 mmol/L (ref 3.5–5.1)
Sodium: 135 mmol/L (ref 135–145)
TCO2: 31 mmol/L (ref 22–32)
pCO2 arterial: 59.2 mmHg — ABNORMAL HIGH (ref 32.0–48.0)
pH, Arterial: 7.301 — ABNORMAL LOW (ref 7.350–7.450)
pO2, Arterial: 119 mmHg — ABNORMAL HIGH (ref 83.0–108.0)

## 2018-02-28 LAB — CBC WITH DIFFERENTIAL/PLATELET
Abs Immature Granulocytes: 0.65 10*3/uL — ABNORMAL HIGH (ref 0.00–0.07)
Basophils Absolute: 0.1 10*3/uL (ref 0.0–0.1)
Basophils Relative: 0 %
Eosinophils Absolute: 0.1 10*3/uL (ref 0.0–0.5)
Eosinophils Relative: 0 %
HCT: 38 % (ref 36.0–46.0)
Hemoglobin: 11.2 g/dL — ABNORMAL LOW (ref 12.0–15.0)
Immature Granulocytes: 2 %
Lymphocytes Relative: 5 %
Lymphs Abs: 1.7 10*3/uL (ref 0.7–4.0)
MCH: 31.2 pg (ref 26.0–34.0)
MCHC: 29.5 g/dL — ABNORMAL LOW (ref 30.0–36.0)
MCV: 105.8 fL — ABNORMAL HIGH (ref 80.0–100.0)
Monocytes Absolute: 1.9 10*3/uL — ABNORMAL HIGH (ref 0.1–1.0)
Monocytes Relative: 6 %
Neutro Abs: 26.4 10*3/uL — ABNORMAL HIGH (ref 1.7–7.7)
Neutrophils Relative %: 87 %
Platelets: 275 10*3/uL (ref 150–400)
RBC: 3.59 MIL/uL — ABNORMAL LOW (ref 3.87–5.11)
RDW: 14.9 % (ref 11.5–15.5)
WBC: 30.7 10*3/uL — ABNORMAL HIGH (ref 4.0–10.5)
nRBC: 0.1 % (ref 0.0–0.2)

## 2018-02-28 MED ORDER — CALCITRIOL 0.25 MCG PO CAPS
ORAL_CAPSULE | ORAL | Status: AC
  Administered 2018-02-28: 0.25 ug via ORAL
  Filled 2018-02-28: qty 1

## 2018-02-28 MED ORDER — CHLORHEXIDINE GLUCONATE 0.12% ORAL RINSE (MEDLINE KIT)
15.0000 mL | Freq: Two times a day (BID) | OROMUCOSAL | Status: DC
  Administered 2018-02-28 – 2018-03-14 (×24): 15 mL via OROMUCOSAL

## 2018-02-28 MED ORDER — CHLORHEXIDINE GLUCONATE CLOTH 2 % EX PADS
6.0000 | MEDICATED_PAD | Freq: Every day | CUTANEOUS | Status: DC
  Administered 2018-02-28 – 2018-03-01 (×2): 6 via TOPICAL

## 2018-02-28 MED ORDER — INSULIN ASPART 100 UNIT/ML ~~LOC~~ SOLN
0.0000 [IU] | SUBCUTANEOUS | Status: DC
  Administered 2018-02-28: 3 [IU] via SUBCUTANEOUS
  Administered 2018-02-28: 2 [IU] via SUBCUTANEOUS
  Administered 2018-02-28: 3 [IU] via SUBCUTANEOUS
  Administered 2018-03-01: 1 [IU] via SUBCUTANEOUS
  Administered 2018-03-01: 2 [IU] via SUBCUTANEOUS
  Administered 2018-03-01: 1 [IU] via SUBCUTANEOUS

## 2018-02-28 MED ORDER — HEPARIN BOLUS VIA INFUSION
4000.0000 [IU] | Freq: Once | INTRAVENOUS | Status: AC
  Administered 2018-02-28: 4000 [IU] via INTRAVENOUS
  Filled 2018-02-28: qty 4000

## 2018-02-28 MED ORDER — LORAZEPAM 2 MG/ML IJ SOLN: 2.0000 mg | Freq: Once | INTRAMUSCULAR | Status: DC

## 2018-02-28 MED ORDER — VANCOMYCIN HCL IN DEXTROSE 1-5 GM/200ML-% IV SOLN
INTRAVENOUS | Status: AC
  Administered 2018-02-28: 1000 mg via INTRAVENOUS
  Filled 2018-02-28: qty 200

## 2018-02-28 MED ORDER — PANTOPRAZOLE SODIUM 40 MG IV SOLR
40.0000 mg | Freq: Every day | INTRAVENOUS | Status: DC
  Administered 2018-02-28 – 2018-03-02 (×3): 40 mg via INTRAVENOUS
  Filled 2018-02-28 (×3): qty 40

## 2018-02-28 MED ORDER — MIDAZOLAM HCL 2 MG/2ML IJ SOLN
2.0000 mg | INTRAMUSCULAR | Status: DC | PRN
  Administered 2018-03-01 (×2): 2 mg via INTRAVENOUS
  Filled 2018-02-28 (×3): qty 2

## 2018-02-28 MED ORDER — NOREPINEPHRINE 4 MG/250ML-% IV SOLN
0.0000 ug/min | INTRAVENOUS | Status: DC
  Administered 2018-02-28: 2 ug/min via INTRAVENOUS
  Filled 2018-02-28: qty 250

## 2018-02-28 MED ORDER — HEPARIN (PORCINE) 25000 UT/250ML-% IV SOLN: 10.0000 [IU]/kg/h | INTRAVENOUS | Status: DC

## 2018-02-28 MED ORDER — HEPARIN (PORCINE) 25000 UT/250ML-% IV SOLN
1300.0000 [IU]/h | INTRAVENOUS | Status: DC
  Administered 2018-02-28: 1250 [IU]/h via INTRAVENOUS
  Administered 2018-03-01: 1300 [IU]/h via INTRAVENOUS
  Administered 2018-03-02 – 2018-03-05 (×5): 1550 [IU]/h via INTRAVENOUS
  Administered 2018-03-06: 1300 [IU]/h via INTRAVENOUS
  Filled 2018-02-28 (×8): qty 250

## 2018-02-28 MED ORDER — HEPARIN SODIUM (PORCINE) 5000 UNIT/ML IJ SOLN
5000.0000 [IU] | Freq: Three times a day (TID) | INTRAMUSCULAR | Status: DC
  Administered 2018-02-28 (×3): 5000 [IU] via SUBCUTANEOUS
  Filled 2018-02-28 (×3): qty 1

## 2018-02-28 MED ORDER — FENTANYL CITRATE (PF) 100 MCG/2ML IJ SOLN
50.0000 ug | Freq: Once | INTRAMUSCULAR | Status: DC
  Filled 2018-02-28: qty 2

## 2018-02-28 MED ORDER — SODIUM CHLORIDE 0.9 % IV BOLUS
500.0000 mL | Freq: Once | INTRAVENOUS | Status: AC
  Administered 2018-02-28: 500 mL via INTRAVENOUS

## 2018-02-28 MED ORDER — VANCOMYCIN HCL IN DEXTROSE 1-5 GM/200ML-% IV SOLN
1000.0000 mg | INTRAVENOUS | Status: AC
  Filled 2018-02-28: qty 200

## 2018-02-28 MED ORDER — LORAZEPAM 1 MG PO TABS: 2.0000 mg | ORAL_TABLET | Freq: Once | ORAL | Status: DC

## 2018-02-28 MED ORDER — SODIUM CHLORIDE 0.9 % IV SOLN: INTRAVENOUS | Status: DC | PRN

## 2018-02-28 MED ORDER — MIDAZOLAM HCL 2 MG/2ML IJ SOLN
2.0000 mg | INTRAMUSCULAR | Status: DC | PRN
  Administered 2018-02-28: 2 mg via INTRAVENOUS
  Filled 2018-02-28: qty 2

## 2018-02-28 MED ORDER — FENTANYL BOLUS VIA INFUSION
50.0000 ug | INTRAVENOUS | Status: DC | PRN
  Filled 2018-02-28: qty 50

## 2018-02-28 MED ORDER — LORAZEPAM 2 MG/ML IJ SOLN
INTRAMUSCULAR | Status: AC
  Filled 2018-02-28: qty 1

## 2018-02-28 MED ORDER — ORAL CARE MOUTH RINSE
15.0000 mL | OROMUCOSAL | Status: DC
  Administered 2018-02-28 – 2018-03-01 (×10): 15 mL via OROMUCOSAL

## 2018-02-28 MED ORDER — FENTANYL 2500MCG IN NS 250ML (10MCG/ML) PREMIX INFUSION
25.0000 ug/h | INTRAVENOUS | Status: DC
  Administered 2018-02-28: 50 ug/h via INTRAVENOUS
  Filled 2018-02-28: qty 250

## 2018-02-28 MED ORDER — MIDAZOLAM HCL 2 MG/2ML IJ SOLN
2.0000 mg | Freq: Once | INTRAMUSCULAR | Status: AC
  Administered 2018-02-28: 2 mg via INTRAMUSCULAR

## 2018-02-28 MED ORDER — ATORVASTATIN CALCIUM 80 MG PO TABS
80.0000 mg | ORAL_TABLET | Freq: Every day | ORAL | Status: DC
  Administered 2018-03-03 – 2018-03-14 (×12): 80 mg
  Filled 2018-02-28 (×12): qty 1

## 2018-02-28 MED ORDER — FENTANYL CITRATE (PF) 100 MCG/2ML IJ SOLN
100.0000 ug | Freq: Once | INTRAMUSCULAR | Status: AC
  Administered 2018-02-28: 100 ug via INTRAVENOUS

## 2018-02-28 NOTE — Anesthesia Postprocedure Evaluation (Signed)
Anesthesia Post Note  Patient: Rachel Chaney  Procedure(s) Performed: RIGHT FOOT 2ND AND 3RD RAY AMPUTATION (Right Foot)     Anesthesia Post Evaluation  Last Vitals:  Vitals:   02/28/18 0930 02/28/18 1000  BP: (!) 119/46 128/78  Pulse: 90 68  Resp:    Temp:    SpO2:      Last Pain:  Vitals:   02/28/18 0706  TempSrc: Tympanic  PainSc: 0-No pain                 Lounette Sloan

## 2018-02-28 NOTE — Progress Notes (Signed)
Report given for HD today.

## 2018-02-28 NOTE — Progress Notes (Signed)
Patient has ordered STAT CTA of chest. CT called and pt has to have an IV. Pt is a hard stick.They cannot use the central line. IV team order placed for STAT. Rahul, PA was notified. Awaiting response. Will continue to monitor.

## 2018-02-28 NOTE — Progress Notes (Addendum)
Family Medicine Teaching Service Daily Progress Note Intern Pager: [phone]  Patient name: Rachel Chaney Medical record number: [id number] Date of birth: 08-06-1960 Age: 58 y.o. Gender: female  Primary Care Provider: Bufford Lope, DO Consultants: Orthopedics, Nephrology Code Status: Full code  Pt Overview and Major Events to Date:  4/17 - 1g IV Ancef in ED 4/17 - Start IV CTX and Vanc 4/19 - Amputation of R 2nd toe  Assessment and Plan: Rachel Chaney is a 58 y.o. female presenting with painful 2nd toe of R foot. PMH is significant for ESRD on HD MWF, T2DM, HTN, GERD, CHF, and dyslipidemia.  #Right second toe fracture OPEN, soft tissue infection, concerning for osteomyelitis:  In the setting of uncontrolled diabetes. Patient s/p amputation of R 2nd toe yesterday. Was found to have large purulent abscess extending to the midfoot. Cultures obtained x2. Cultures so far demonstrating staph aureus, awaiting susceptibilities. She remains afebrile, VSS, WBC downtrending. ABIs yesterday R 1.65 and L 2.85 suggesting presence of calcified vessels which is common in underlying DM or ESRD. Plan to continue to treat for osteomyelitis and diabetic foot wound. Gustilio-Anderson open fracture scale suggesting Type III fracture warranting broad spectrum antibiotics.  - Ortho consulted, appreciate recs - Continue CTX 2g daily and Vanc 3 g daily, 1 g with dialysis until cultures are finalized - F/U Cx from abscess then consider narrowing abx - If no gram negatives on Cx tomorrow, consider discontinuing CTX - Pain controlled with Tylenol and oxycodone - Ambulate with assistance - Vitals per routine - c/s PT after surgery  #End-stage renal disease on HD Receives hemodialysis MWF at [facility], missed session on day of admission, Mon 2/17. Had make up dialysis session on 2/18, HD session earlier this am and transition back to MWF next week.  Is still producing urine. K and bicarb wnl. - Dialysis per  Nephro - Nephro consulted, appreciate recs - Renal/carb modified diet  #T2DM: HbA1c 11.4% 02/26/18.  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  She denies vision loss and polydipsia.  She reports retinopathy, polyphagia, and polyuria.  Patient reports CBGs between 120-140. Home regimen is 50 units Levemir twice daily. Unclear if she is compliant with this at home given good response to insulin regimen here. FBG 212 this AM. Pt was NPO yesterday and received 1/2 her normal insulin regimen. 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 Was receiving treatment as outpatient.  - Nephro following, appreciate recs - Continue home calcitriol and auryxia  #HTN: Currently normotensive.  Home regimen includes hydralazine and carvedilol.  - Continue home dose hydralazine 50 mg and carvedilol 25 mg  - Nephrology following, appreciate recs  #HFpEF: Last echo 04/12/2015 with Grade 1 diastolic dysfunction, EF 38-25%. Patient with 1+ peripheral edema on exam but no crackles. Does not take diuretics at home. - Continue home carvedilol  #Hypoalbuminemia Albumin 2.6 on admission. No evidence of liver disease. Urinalysis from 2017 with >300 protein indicating that there is likely a nephrotic syndrome component to her hypoalbuminemia. Protein calorie malnutrition given poor diet could also be contributing. - Pro-Stat and renal MVI supplementation per Nutrition , appreciate recs - Nephro following, appreciate recs  #Anemia of CKD:  Hgb 10.8 on admission (baseline in the 12's as outpatient). Per nephro, suspect some blood loss with injury.  - Daily CBCs  #  Hyperlipidemia: Most recent lipid panel April 2019 showing low cholesterol at 94, low HDL at 32, and other values within normal limits. - Continue atorvastatin 80 mg  #GERD:  Patient asymptomatic at this time, usually takes Tums at home. -Monitor for acid reflux symptoms.  FEN/GI: Renal/carb modified diet, NPO at midnight PPx: Heparin  Disposition: Admit to med surg  Subjective:  Unable to see patient this morning as she was in dialysis. Returned to see patient this afternoon and a code was being run, patient was intubated. Patient was transferred to ICU and CCM was consulted.   Objective: Temp:  [97.7 F (36.5 C)-98.9 F (37.2 C)] (P) 98.4 F (36.9 C) (02/20 0706) Pulse Rate:  [67-79] (P) 74 (02/20 0706) Resp:  [16-22] (P) 18 (02/20 0706) BP: (115-161)/(61-100) (P) 128/50 (02/20 0706) SpO2:  [88 %-99 %] (P) 99 % (02/20 0706)   Physical Exam: Unable to perform this morning as patient was in dialysis.Patient nonresponsive, intubated. Code being run. ICU will take over patient care.  Laboratory: Recent Labs  Lab 02/26/18 0348 02/27/18 0801 02/28/18 0524  WBC 29.3* 32.1* 30.7*  HGB 10.8* 11.9* 11.2*  HCT 35.0* 39.2 38.0  PLT 254 299 275   Recent Labs  Lab 02/25/18 1105  02/27/18 0801 02/28/18 0524 02/28/18 0722  NA 135   < > 136 135 133*  K 3.7   < > 4.5 4.6 4.4  CL 90*   < > 95* 96* 91*  CO2 30   < > 28 20* 25  BUN 44*   < > 26* 39* 40*  CREATININE 8.98*   < > 6.27* 7.97* 7.84*  CALCIUM 8.6*   < > 8.5* 8.4* 8.3*  PROT 6.9  --   --   --   --   BILITOT 0.6  --   --   --   --   ALKPHOS 85  --   --   --   --   ALT 18  --   --   --   --   AST 15  --   --   --   --   GLUCOSE 69*   < > 147* 212* 198*   < > = values in this interval not displayed.     Imaging/Diagnostic Tests: Dg Foot Complete Right  Result Date: 02/25/2018 CLINICAL DATA:  Toe injury. EXAM: RIGHT FOOT COMPLETE - 3+ VIEW COMPARISON:  Radiographs of May 14, 2012. FINDINGS: Moderately displaced fracture is seen involving the proximal portion of the first proximal phalanx with intra-articular extension with some callus formation suggesting subacute fracture. Minimally  displaced fracture is seen involving the proximal portion of the second distal phalanx. Soft tissue irregularity or wound is seen involving the distal portion of the second toe. Mild posterior calcaneal spurring is noted. IMPRESSION: Probable subacute moderately displaced fracture is seen involving the first proximal phalanx with intra-articular extension. Probable minimally displaced acute fracture is seen involving proximal portion of the second distal phalanx. Overlying soft tissue irregularity or wound is seen involving the distal soft tissues of the second toe. Electronically Signed   By: Marijo Conception, M.D.   On: 02/25/2018 12:17    I have seen and evaluated the patient with Medical Student Minish. I am in agreement with the note above in its revised form. My additions are in blue.  Marjie Skiff, MD Family Medicine, PGY-3   Mitchell Heir, Medical Student 02/28/2018, 9:20 AM [place] Intern pager: [phone], text  pages welcome

## 2018-02-28 NOTE — Anesthesia Procedure Notes (Signed)
Procedure Name: Intubation Performed by: Milford Cage, CRNA Pre-anesthesia Checklist: Patient identified, Emergency Drugs available, Suction available and Patient being monitored Patient Re-evaluated:Patient Re-evaluated prior to induction Oxygen Delivery Method: Circle System Utilized Preoxygenation: Pre-oxygenation with 100% oxygen Induction Type: IV induction Laryngoscope Size: Glidescope and 4 Grade View: Grade I Tube type: Oral Tube size: 7.5 mm Number of attempts: 1 Airway Equipment and Method: Oral airway and Rigid stylet Placement Confirmation: ETT inserted through vocal cords under direct vision,  positive ETCO2 and breath sounds checked- equal and bilateral Secured at: 24 cm Tube secured with: Tape Dental Injury: Teeth and Oropharynx as per pre-operative assessment  Comments: Code Blue intubation. Glidescope 4. Grade 1 view. BS Bilaterally diminished.

## 2018-02-28 NOTE — Progress Notes (Signed)
Called to room by staff in the hall. Observed pt in the bed with head and eyes back. Foaming at the mouth and unresponsive to all stimuli. Unable to locate carotid, brachial, or femoral pulse. Agonal breathing observed. Intiated CPR and called code blue. Code team responded and documented. Pt tranferred to 4N20. Pt's spouse was called and en route to facility.

## 2018-02-28 NOTE — Progress Notes (Signed)
ANTICOAGULATION CONSULT NOTE - Initial Consult  Pharmacy Consult for Heparin Indication: DVT  No Known Allergies  Patient Measurements: Height: 5\' 4"  (162.6 cm) Weight: 242 lb 1 oz (109.8 kg) IBW/kg (Calculated) : 54.7 Heparin Dosing Weight: 80 kg  Vital Signs: Temp: 98.9 F (37.2 C) (02/20 2000) Temp Source: Oral (02/20 2000) BP: 114/47 (02/20 1936) Pulse Rate: 77 (02/20 1936)  Labs: Recent Labs    02/26/18 0348 02/27/18 0801 02/28/18 0524 02/28/18 0722 02/28/18 1720  HGB 10.8* 11.9* 11.2*  --  12.6  HCT 35.0* 39.2 38.0  --  37.0  PLT 254 299 275  --   --   CREATININE 9.62* 6.27* 7.97* 7.84*  --     Estimated Creatinine Clearance: 9.4 mL/min (A) (by C-G formula based on SCr of 7.84 mg/dL (H)).   Medical History: Past Medical History:  Diagnosis Date  . CHF (congestive heart failure) ([place])   . Chronic kidney disease   . Chronic kidney disease due to diabetes mellitus ([place]) 10/13/2013  . Diabetes mellitus   . Hyperlipidemia   . Hypertension     Medications:  Infusions:  . sodium chloride 10 mL/hr at 02/28/18 2100  . sodium chloride    . cefTRIAXone (ROCEPHIN)  IV 2 g (02/28/18 2103)  . fentaNYL infusion INTRAVENOUS 50 mcg/hr (02/28/18 2114)  . heparin    . norepinephrine (LEVOPHED) Adult infusion 4 mcg/min (02/28/18 1800)  . vancomycin Stopped (02/28/18 1134)    Assessment: 58 y.o. F known to pharmacy from antibiotic dosing. To begin heparin for R upper arm DVT. LE [place] negative for DVT. CT chest pending. Pt received SQ heparin 5000 units ~1300. Pt with ESRD, on HD. CBC stable.  Goal of Therapy:  Heparin level 0.3-0.7 units/ml Monitor platelets by anticoagulation protocol: Yes   Plan:  Heparin IV bolus 4000 units Heparin gtt at 1250 units/hr Will f/u heparin level in 8 hours Daily heparin level and CBC  Sherlon Handing, PharmD, BCPS Clinical pharmacist  **Pharmacist phone directory can now be found on [email or web address] ([email or web address]).  Listed under [place]. 02/28/2018,10:21 PM

## 2018-02-28 NOTE — Procedures (Signed)
Arterial Catheter Insertion Procedure Note Rachel Chaney [id number] September 04, 1960  Procedure: Insertion of Arterial Catheter  Indications: Blood pressure monitoring and Frequent blood sampling  Procedure Details Consent: Risks of procedure as well as the alternatives and risks of each were explained to the (patient/caregiver).  Consent for procedure obtained. Time Out: Verified patient identification, verified procedure, site/side was marked, verified correct patient position, special equipment/implants available, medications/allergies/relevent history reviewed, required imaging and test results available.  Performed  Maximum sterile technique was used including antiseptics, cap, gloves, gown, hand hygiene, mask and sheet. Skin prep: Chlorhexidine; local anesthetic administered 20 gauge catheter was inserted into right femoral artery using the Seldinger technique. ULTRASOUND GUIDANCE USED: YES Evaluation Blood flow good; BP tracing good. Complications: No apparent complications.   Montey Hora, [facility] Pager: [phone].  If no answer, [phone] 02/28/2018, 5:15 PM

## 2018-02-28 NOTE — Progress Notes (Signed)
PT Cancellation Note  Patient Details Name: Rachel Chaney MRN: [id number] DOB: 1960-05-04   Cancelled Treatment:    Reason Eval/Treat Not Completed: Patient at procedure or test/unavailable At HD. Will follow-up as time allows.  Lanney Gins, PT, DPT Supplemental Physical Therapist 02/28/18 8:13 AM Pager: [phone] Office: [phone]

## 2018-02-28 NOTE — Progress Notes (Signed)
Bilateral lower extremity venous duplex has been completed. Preliminary results can be found in CV Proc through chart review.  Results were given to the patient's nurse, [place].  02/28/18 6:36 PM Carlos Levering RVT

## 2018-02-28 NOTE — Progress Notes (Addendum)
Pharmacy Antibiotic Note  Rachel Chaney is a 58 y.o. female admitted on 02/25/2018 with wound infection. Patient had amputation on 2/19. Per surgery note, a large abscess was noted and antibiotics should be continued until culture results return. Pharmacy has been consulted for vancomycin dosing. Patient is receiving HD session today with plan to resume normal MWF tomorrow, 2/20. Will follow patient's HD schedule closely.  Plan: Vancomycin 1000 mg IV with HD x1 today Follow for next HD session  Height: 5\' 4"  (162.6 cm) Weight: 241 lb 10 oz (109.6 kg) IBW/kg (Calculated) : 54.7  Temp (24hrs), Avg:98.3 F (36.8 C), Min:97.7 F (36.5 C), Max:98.9 F (37.2 C)  Recent Labs  Lab 02/25/18 1105 02/26/18 0348 02/27/18 0801 02/28/18 0524 02/28/18 0722  WBC 27.0* 29.3* 32.1* 30.7*  --   CREATININE 8.98* 9.62* 6.27* 7.97* 7.84*    Estimated Creatinine Clearance: 9.4 mL/min (A) (by C-G formula based on SCr of 7.84 mg/dL (H)).    No Known Allergies  Antimicrobials this admission: Ceftriaxone 2/17 >>  Vancomycin 2/17 >>  Cefazolin x 1 in ED  Dose adjustments this admission: n/a  Microbiology results: 2/18 MRSA PCR: negative 2/19 Wound culture: abundant gram positive cocci  Thank you for allowing pharmacy to be a part of this patient's care.  Patrina Levering, PharmD Candidate 02/28/2018 9:13 AM

## 2018-02-28 NOTE — Progress Notes (Addendum)
eLink Physician-Brief Progress Note Patient Name: Rachel Chaney DOB: [birth year] MRN: [id number]   Date of Service  02/28/2018  HPI/Events of Note  57/F s/p arrest on the floors.  There is no clear etiology yet.  CTA ordered but the central line could not be used for contrast administration.  There is no peripheral line.  IV team attempted but she has very difficult access.   On ultrasound, however, there was a clot on the cephalic vein.   eICU Interventions  The patient remains on levophed.  We will treat for presumed PE with heparin gtt.  2d echo to be done in the AM to look for heart strain.  Check troponin. Doppler ultrasound of upper extremities ordered.     Intervention Category Major Interventions: Other:  [name] 02/28/2018, 10:17 PM

## 2018-02-28 NOTE — Progress Notes (Signed)
Notified Dr. Loanne Drilling (on unit) of dropping BP. Unsure if accurate. Aline and 500 cc bolus ordered. RT notified of aline order.

## 2018-02-28 NOTE — Code Documentation (Signed)
CODE BLUE NOTE  Patient Name: Rachel Chaney   MRN: [id number]   Date of Birth/ Sex: [birth year] , female      Admission Date: 02/25/2018  Attending Provider: McDiarmid, Blane Ohara, MD  Primary Diagnosis: Open toe fracture    Indication: Pt was in her usual state of health until this PM, when she was noted to be unresponsive. Code blue was subsequently called. At the time of arrival on scene, ACLS protocol was underway.    Technical Description:  - CPR performance duration:  6 minutes  - Was defibrillation or cardioversion used? No   - Was external pacer placed? No  - Was patient intubated pre/post CPR? Yes    Medications Administered: Y = Yes; Blank = No Amiodarone    Atropine    Calcium    Epinephrine  1  Lidocaine    Magnesium    Norepinephrine    Phenylephrine    Sodium bicarbonate  1  Vasopressin      Post CPR evaluation:  - Final Status - Was patient successfully resuscitated ? Yes - What is current rhythm? Sinus tachycardia - What is current hemodynamic status? hypertensive   Miscellaneous Information:  - Labs sent, including: Abg, cxr, cmp, cbc  - Primary team notified?  Yes  - Family Notified? No  - Additional notes/ transfer status: Transferred to critical care        Bonnita Hollow, MD  02/28/2018, 3:18 PM

## 2018-02-28 NOTE — Procedures (Signed)
Central Venous Catheter Insertion Procedure Note LESLEA VOWLES [id number] April 09, 1960  Procedure: Insertion of Central Venous Catheter Indications: Assessment of intravascular volume, Drug and/or fluid administration and Frequent blood sampling  Procedure Details Consent: Risks of procedure as well as the alternatives and risks of each were explained to the (patient/caregiver).  Consent for procedure obtained. Time Out: Verified patient identification, verified procedure, site/side was marked, verified correct patient position, special equipment/implants available, medications/allergies/relevent history reviewed, required imaging and test results available.  Performed  Maximum sterile technique was used including antiseptics, cap, gloves, gown, hand hygiene, mask and sheet. Skin prep: Chlorhexidine; local anesthetic administered A antimicrobial bonded/coated triple lumen catheter was placed in the right femoral vein due to emergent situation using the Seldinger technique.  Evaluation Blood flow good Complications: No apparent complications Patient did tolerate procedure well. Chest X-ray ordered to verify placement.  CXR: N/A.  Procedure performed under direct ultrasound guidance for real time vessel cannulation.      Montey Hora, [facility] Pager: [phone]  or [phone] 02/28/2018, 5:14 PM

## 2018-02-28 NOTE — Progress Notes (Signed)
IV team RN unable to get patient an IV for STAT CT. Thrombus was noted in the right upper arm cephalic vein on ultra sound. All other veins on ultra sound were too small for IV placement or too deep. Called CT to reverify that pt could not get contrast through CVC. Rahul, PA was notified. Will continue to monitor patient.

## 2018-02-28 NOTE — Progress Notes (Signed)
Late entry: Went in to see patient regarding her DM.  Eyes were open and she responded to her name saying "yes".  However upon further assessment, patient not answering questions appropriately.Stood in doorway and asked RN to come in and see patient immediately due to patients inability to answer my questions and writhing in the bed.  RN came in room and patient was not breathing.  CPR started and CODE called by RN.     Adah Perl, RN, BC-ADM Inpatient Diabetes Coordinator Pager [phone]

## 2018-02-28 NOTE — Progress Notes (Addendum)
[facility] Progress Note   Subjective:  Seen on HD - 2.5L goal, tolerating without issues. No CP/dspnea. No foot pain at this time s/p amputation of 2nd/3rd R toes on 2/19 (controlled with meds).  Objective Vitals:   02/27/18 1720 02/27/18 1745 02/27/18 2234 02/28/18 0354  BP: (!) 146/73 (!) 141/65 127/61 (!) 131/100  Pulse: 67 70 72 75  Resp: 20 20 16 20   Temp:  97.7 F (36.5 C) 98.9 F (37.2 C) 98.5 F (36.9 C)  TempSrc:  Oral  Oral  SpO2: 95% 97% 96% 99%  Weight:      Height:       Physical Exam General: Well appearing woman, NAD. Wearing nasal oxygen. Heart: RRR; no murmur Lungs: CTA anteriorly Abdomen: soft, non-tender Extremities: R foot bandaged with wound vac, 1+ edema ; LLE without edema Dialysis Access: L AVF + thrill  Additional Objective Labs: Basic Metabolic Panel: Recent Labs  Lab 02/27/18 0801 02/28/18 0524 02/28/18 0722  NA 136 135 133*  K 4.5 4.6 4.4  CL 95* 96* 91*  CO2 28 20* 25  GLUCOSE 147* 212* 198*  BUN 26* 39* 40*  CREATININE 6.27* 7.97* 7.84*  CALCIUM 8.5* 8.4* 8.3*  PHOS  --   --  7.9*   Liver Function Tests: Recent Labs  Lab 02/25/18 1105 02/28/18 0722  AST 15  --   ALT 18  --   ALKPHOS 85  --   BILITOT 0.6  --   PROT 6.9  --   ALBUMIN 2.6* 2.3*   CBC: Recent Labs  Lab 02/25/18 1105 02/26/18 0348 02/27/18 0801 02/28/18 0524  WBC 27.0* 29.3* 32.1* 30.7*  NEUTROABS 22.7*  --  28.9* PENDING  HGB 11.1* 10.8* 11.9* 11.2*  HCT 36.9 35.0* 39.2 38.0  MCV 103.1* 102.0* 105.1* 105.8*  PLT 247 254 299 275   Blood Culture    Component Value Date/Time   SDES ABSCESS RIGHT FOOT 02/27/2018 1520   SPECREQUEST 2ND AND 3RD RAY 02/27/2018 1520   CULT PENDING 02/27/2018 1520   REPTSTATUS PENDING 02/27/2018 1520   Studies/Results: Vas [place] Abi With/wo Tbi  Result Date: 02/27/2018 LOWER EXTREMITY DOPPLER STUDY Indications: Diabetes.  Limitations: Patient movement, open toe wound Performing Technologist: Carlos Levering RVT  Examination Guidelines: A complete evaluation includes at minimum, Doppler waveform signals and systolic blood pressure reading at the level of bilateral brachial, anterior tibial, and posterior tibial arteries, when vessel segments are accessible. Bilateral testing is considered an integral part of a complete examination. Photoelectric Plethysmograph (PPG) waveforms and toe systolic pressure readings are included as required and additional duplex testing as needed. Limited examinations for reoccurring indications may be performed as noted.  ABI Findings: +--------+------------------+-----+---------+--------+ Right   Rt Pressure (mmHg)IndexWaveform Comment  +--------+------------------+-----+---------+--------+ Brachial89                                       +--------+------------------+-----+---------+--------+ PTA     96                1.08 triphasic         +--------+------------------+-----+---------+--------+ DP      147               1.65 triphasic         +--------+------------------+-----+---------+--------+ +--------+------------------+-----+---------+---------+ Left    Lt Pressure (mmHg)IndexWaveform Comment   +--------+------------------+-----+---------+---------+ Brachial  HD Access +--------+------------------+-----+---------+---------+ PTA     161               1.81 biphasic           +--------+------------------+-----+---------+---------+ DP      254               2.85 triphasic          +--------+------------------+-----+---------+---------+ +-------+-----------+-----------+------------+------------+ ABI/TBIToday's ABIToday's TBIPrevious ABIPrevious TBI +-------+-----------+-----------+------------+------------+ Right  1.65                                           +-------+-----------+-----------+------------+------------+ Left   2.85                                            +-------+-----------+-----------+------------+------------+  Summary: Right: Resting right ankle-brachial index indicates noncompressible right lower extremity arteries. Unable to obtain TBI due to patient movement. Left: Resting left ankle-brachial index indicates noncompressible left lower extremity arteries. Unable to obtain TBI due to patient movement.  *See table(s) above for measurements and observations.  Electronically signed by Servando Snare MD on 02/27/2018 at 2:09:40 PM.   Final    Medications: . sodium chloride 10 mL/hr at 02/27/18 1816  . cefTRIAXone (ROCEPHIN)  IV 2 g (02/27/18 1817)  . methocarbamol (ROBAXIN) IV    . vancomycin     . atorvastatin  80 mg Oral Daily  . calcitRIOL  0.25 mcg Oral Daily  . carvedilol  25 mg Oral BID WC  . Chlorhexidine Gluconate Cloth  6 each Topical Q0600  . Chlorhexidine Gluconate Cloth  6 each Topical Daily  . Chlorhexidine Gluconate Cloth  6 each Topical Q0600  . docusate sodium  100 mg Oral BID  . feeding supplement (PRO-STAT SUGAR FREE 64)  30 mL Oral BID  . ferric citrate  420 mg Oral TID WC  . heparin  5,000 Units Subcutaneous Q8H  . hydrALAZINE  50 mg Oral TID  . insulin aspart  0-15 Units Subcutaneous TID WC  . insulin detemir  25 Units Subcutaneous BID  . multivitamin  1 tablet Oral QHS  . mupirocin ointment  1 application Nasal BID  . senna  1 tablet Oral BID    Dialysis Orders: MWF- [facility] 4:00 hrs, BFR400, DFR800, EDW 109kg,2K/2Ca, LUE AVF Heparin: 8000 unit IV bolus, 2000 unit IV intermittent bolus Calcitriol: 1.75mcg PO qHD Last Labs:01/28/2018:Hgb 12.1, TSAT30.0, K4.7, Ca8.2, P6.3, PTH1028.0, Alb3.6.  Assessment/Plan: 1. Osteomyelitis R 2nd/3rd toes: WBC ~30. Afebrile. S/p 2nd/3rd ray amputation 2/19 - wound vac in place. On Vanc/Ceftriaxone.   2. ESRD: Usual MWF- off sched this week (has been dialyzed Tues/Thur) - will plan HD tomorrow to get back on usual schedule. 3. Hypertension/volume: BP decent, mild  edema - UF as tolerated. 4. Anemiaof CKD: Hgb 11.2, monitor without ESA. 5. Secondary Hyperparathyroidism: Corr Ca ok, Phos ^ - continue Auryxia as binder and VDRA, may need to ^ binder dose soon. 6. Nutrition: Alb 2.3 - continue pro-stat supplements. 7. T2DM: Insulin per primary team   Veneta Penton, PA-C 02/28/2018, 8:32 AM  [facility] Pager: [phone]  Pt seen, examined and agree w A/P as above.  [facility] 02/28/2018, 2:41 PM

## 2018-02-28 NOTE — Progress Notes (Signed)
Critical ABG results called to Loanne Drilling, MD. No changes to be made at this time. Will continue to monitor.

## 2018-02-28 NOTE — Social Work (Signed)
CSW acknowledging consult for SNF placement. Will follow for therapy recommendations.   Aiman Sonn, MSW, LCSWA [facility] [phone]   

## 2018-02-28 NOTE — Care Management Important Message (Signed)
Important Message  Patient Details  Name: Rachel Chaney MRN: [id number] Date of Birth: 09/29/1960   Medicare Important Message Given:  Yes    Orbie Pyo 02/28/2018, 2:21 PM

## 2018-02-28 NOTE — Progress Notes (Signed)
Patient at this time has no PIV access. Patient's previous IV sites have infiltrated. PIV attempt was made using the ultrasound but the patient could not be redirected to hold the arm still. I spoke with MD on the unit and recommended a central line at this time.

## 2018-02-28 NOTE — Consult Note (Signed)
NAME:  Rachel Chaney, MRN:  [id number], DOB:  June 26, 1960, LOS: 3 ADMISSION DATE:  02/25/2018, CONSULTATION DATE:  02/28/18 REFERRING MD: Josephine Igo, MD CHIEF COMPLAINT:  S/p cardiac arrest  Brief History   58 year old female with ESRD who presented for foot injury and admitted for amputation of 2nd and 3rd digit of right foot on 2/19. No post-op complications. Found unresponsive during dialysis on 2/20, CPR performed with ROSC. Transferred to ICU   History of present illness   58 year old female who admitted for crush injury of foot. 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 Wound culture grew S. Aureus with sensitivities pending. During dialysis, patient was found unresponsive. CPR was performed for 6 minutes with ROSC. She received epi x1 and sodium bicarb x 1. She was intubated and transferred to ICU.  Past Medical History  ESRD on HD, HTN, DM2, GERD  Significant Hospital Events   2/19 Toe amputation on right foot 2/20 Cardiac arrest, intubation  Consults:  PCCM Nephro  Procedures:  Intubation 2/20 A-line 2/20  Significant Diagnostic Tests:  2/17 Right Foot XR - displaced fracture of 1st and 2nd proximal phalanx  2/19 CXR Bilateral patchy infiltrates in lower lobe of right lung, left effusion/atelectasis  Micro Data:  Wound culture 2/19 S. Aureus. Susceptibilities pending  Antimicrobials:  Vanc Ceftriaxone  Interim history/subjective:    Objective   Blood pressure 101/63, pulse 84, temperature 98.1 F (36.7 C), temperature source Oral, resp. rate 17, height _0  (1.626 m), weight 109.8 kg, last menstrual period 09/18/2011, SpO2 (!) 88 %.    Vent Mode: PRVC FiO2 (%):  [100 %] 100 % Set Rate:  [16 bmp] 16 bmp Vt Set:  [440 mL] 440 mL PEEP:  [5 cmH20] 5 cmH20 Plateau Pressure:  [25 cmH20] 25 cmH20   Intake/Output Summary (Last 24 hours) at 02/28/2018 1533 Last data filed at 02/28/2018 1500 Gross per 24 hour  Intake 564.06 ml    Output 1978 ml  Net -1413.94 ml   Filed Weights   02/26/18 1628 02/28/18 0706 02/28/18 1117  Weight: 109.6 kg 112.6 kg 109.8 kg   Physical Exam: General: MO, Chronically ill-appearing female, laying in bed HENT: [place], AT, ETT in place Eyes: EOMI, no scleral icterus Respiratory: Clear to auscultation bilaterally.  No crackles, wheezing or rales Cardiovascular: RRR, -M/R/G, no JVD GI: BS+, soft, nontender Extremities:-Edema,-tenderness Neuro: Agitated, unable to follow commands Skin: Intact, no rashes or bruising Psych: Unable to assess GU: No foley in place  Resolved Hospital Problem list     Assessment & Plan:   S/p cardiac arrest Unclear etiology. Consider MI, arrhythmia, PE. Family report episode of near syncope post-procedure.  -No indication for hypothermia protocol -EKG, telemetry -Trend troponin -TTE -CTA -LE dopplers  Hypotension Unclear if volume depletion from dialysis, cardiogenic or septic -IVF -Arterial line -Start pressor support if needed to maintain MAPs >65 -Continue antibiotics -Blood cultures x2 -Trend LA -CVC and arterial line  Acute hypoxemic failure due to inability to protect airway -Full vent support -ABG 1hour post-intubation -Fentanyl gtt for sedation  Right toe fractures with soft tissue infection Cx + S.Aureus -Continue Vanc and Ceftriaxone -F/u culture sensitivities  Best practice:  Diet: NPO Pain/Anxiety/Delirium protocol (if indicated): Fentanyl gtt and PRN VAP protocol (if indicated): Yes DVT prophylaxis: Heparin GI prophylaxis: PPI Glucose control: SSI Mobility: BR Code Status: Full Family Communication: Husband Disposition:   Labs   CBC: Recent Labs  Lab 02/25/18  1105 02/26/18 0348 02/27/18 0801 02/28/18 0524  WBC 27.0* 29.3* 32.1* 30.7*  NEUTROABS 22.7*  --  28.9* 26.4*  HGB 11.1* 10.8* 11.9* 11.2*  HCT 36.9 35.0* 39.2 38.0  MCV 103.1* 102.0* 105.1* 105.8*  PLT 247 254 299 027    Basic Metabolic  Panel: Recent Labs  Lab 02/25/18 1105 02/26/18 0348 02/27/18 0801 02/28/18 0524 02/28/18 0722  NA 135 133* 136 135 133*  K 3.7 3.9 4.5 4.6 4.4  CL 90* 90* 95* 96* 91*  CO2 _0 20* 25  GLUCOSE 69* 162* 147* 212* 198*  BUN 44* 51* 26* 39* 40*  CREATININE 8.98* 9.62* 6.27* 7.97* 7.84*  CALCIUM 8.6* 8.2* 8.5* 8.4* 8.3*  PHOS  --   --   --   --  7.9*   GFR: Estimated Creatinine Clearance: 9.4 mL/min (A) (by C-G formula based on SCr of 7.84 mg/dL (H)). Recent Labs  Lab 02/25/18 1105 02/26/18 0348 02/27/18 0801 02/28/18 0524  WBC 27.0* 29.3* 32.1* 30.7*    Liver Function Tests: Recent Labs  Lab 02/25/18 1105 02/28/18 0722  AST 15  --   ALT 18  --   ALKPHOS 85  --   BILITOT 0.6  --   PROT 6.9  --   ALBUMIN 2.6* 2.3*   No results for input(s): LIPASE, AMYLASE in the last 168 hours. No results for input(s): AMMONIA in the last 168 hours.  ABG No results found for: PHART, PCO2ART, PO2ART, HCO3, TCO2, ACIDBASEDEF, O2SAT   Coagulation Profile: No results for input(s): INR, PROTIME in the last 168 hours.  Cardiac Enzymes: No results for input(s): CKTOTAL, CKMB, CKMBINDEX, TROPONINI in the last 168 hours.  HbA1C: Hemoglobin A1C  Date/Time Value Ref Range Status  04/12/2017 08:25 AM 8.6  Final  08/22/2016 08:28 AM 8.6  Final   HbA1c, POC (controlled diabetic range)  Date/Time Value Ref Range Status  08/23/2017 08:43 AM 9.1 (A) 0.0 - 7.0 % Final   Hgb A1c MFr Bld  Date/Time Value Ref Range Status  02/26/2018 03:48 AM 11.4 (H) 4.8 - 5.6 % Final    Comment:    (NOTE)         Prediabetes: 5.7 - 6.4         Diabetes: >6.4         Glycemic control for adults with diabetes: <7.0   04/12/2015 05:02 AM 6.7 (H) 4.8 - 5.6 % Final    Comment:    (NOTE)         Pre-diabetes: 5.7 - 6.4         Diabetes: >6.4         Glycemic control for adults with diabetes: <7.0     CBG: Recent Labs  Lab 02/27/18 1614 02/27/18 1747 02/27/18 2226 02/28/18 0849  02/28/18 1201  GLUCAP 125* 126* 180* 118* 119*    Review of Systems:   Unable to obtain due to mental status  Past Medical History  She,  has a past medical history of CHF (congestive heart failure) ([place]), Chronic kidney disease, Chronic kidney disease due to diabetes mellitus ([place]) (10/13/2013), Diabetes mellitus, Hyperlipidemia, and Hypertension.   Surgical History    Past Surgical History:  Procedure Laterality Date  . AMPUTATION Right 02/27/2018   Procedure: RIGHT FOOT 2ND AND 3RD RAY AMPUTATION;  Surgeon: [name], MD;  Location: [place];  Service: Orthopedics;  Laterality: Right;  . AV FISTULA PLACEMENT Left 04/15/2015   Procedure: ARTERIOVENOUS (AV) FISTULA CREATION;  Surgeon:  Mal Misty, MD;  Location: [place];  Service: Vascular;  Laterality: Left;  . EYE SURGERY Right    retinal detachment  . FISTULA SUPERFICIALIZATION Left 06/03/2015   Procedure: LEFT UPPER ARM BRACHIOCEPHALIC FISTULA SUPERFICIALIZATION;  Surgeon: Mal Misty, MD;  Location: [place];  Service: Vascular;  Laterality: Left;  from MAC to General     Social History   reports that she has never smoked. She has never used smokeless tobacco. She reports that she does not drink alcohol or use drugs.   Family History   Her family history includes Heart disease in her father and mother.   Allergies No Known Allergies   Home Medications  Prior to Admission medications   Medication Sig Start Date End Date Taking? Authorizing Provider  acetaminophen (TYLENOL) 500 MG tablet Take 1,000 mg by mouth every 6 (six) hours as needed for pain.   Yes [provider]  carvedilol (COREG) 25 MG tablet Take 1 tablet (25 mg total) by mouth 2 (two) times daily with a meal. 10/17/17  Yes Orson Eva J, DO  cyclobenzaprine (FLEXERIL) 10 MG tablet Take 1 tablet (10 mg total) by mouth at bedtime. 05/21/17  Yes Mercy Riding, MD  ferric citrate (AURYXIA) 1 GM 210 MG(Fe) tablet Take 420 mg by mouth 3 (three) times daily with  meals.   Yes [provider]  hydrALAZINE (APRESOLINE) 50 MG tablet Take 1 tablet (50 mg total) by mouth 3 (three) times daily. Patient taking differently: Take 50 mg by mouth 2 (two) times daily.  10/17/17  Yes Orson Eva J, DO  hydrOXYzine (ATARAX/VISTARIL) 50 MG tablet Take 50 mg by mouth 2 (two) times daily as needed for itching.   Yes [provider]  LEVEMIR FLEXTOUCH 100 UNIT/ML Pen ADMINISTER 50 UNITS UNDER THE SKIN TWICE DAILY Patient taking differently: Inject 50 Units into the skin 2 (two) times daily.  01/07/18  Yes Edenton Bing, DO  multivitamin (RENA-VIT) TABS tablet Take 1 tablet by mouth at bedtime. 07/20/15  Yes Haney, Alyssa A, MD  atorvastatin (LIPITOR) 80 MG tablet Take 1 tablet (80 mg total) by mouth daily. 10/17/17   Bufford Lope, DO  Blood Glucose Monitoring Suppl (ONE TOUCH ULTRA 2) w/Device KIT 1 kit by Does not apply route 3 (three) times daily. ICD-10 code: E11.40 08/23/16   Dickie La, MD  calcitRIOL (ROCALTROL) 0.25 MCG capsule TAKE 1 CAPSULE (0.25 MCG TOTAL) BY MOUTH DAILY. Patient taking differently: Take 0.25 mcg by mouth daily.  02/19/15   Veatrice Bourbon, MD  calcium acetate (PHOSLO) 667 MG capsule Take 1 capsule (667 mg total) by mouth 3 (three) times daily with meals. Patient not taking: Reported on 02/25/2018 04/19/15   Verner Mould, MD  Darbepoetin Alfa (ARANESP) 25 MCG/0.42ML SOSY injection Inject 0.42 mLs (25 mcg total) into the vein every Monday with hemodialysis. Patient not taking: Reported on 02/25/2018 04/19/15   Verner Mould, MD  fluticasone [facility]) 50 MCG/ACT nasal spray Place 1 spray into both nostrils daily. 1 spray in each nostril every day Patient not taking: Reported on 02/25/2018 04/12/17   Bufford Lope, DO  glucose blood (ONE TOUCH ULTRA TEST) test strip 1 each by Other route 3 (three) times daily. ICD-10 code: E11.40. 08/23/16   Dickie La, MD  glucose monitoring kit (FREESTYLE) monitoring kit 1 each by  Does not apply route as needed for other. 11/09/16   Bufford Lope, DO  Insulin Pen Needle (  B-D UF III MINI PEN NEEDLES) 31G X 5 MM MISC USE AS DIRECTED FOR INSULIN INJECTION TWICE DAILY 03/09/17   Bufford Lope, DO  Insulin Pen Needle 29G X 12MM MISC Inject 1 pen into the skin 2 (two) times daily. Check blood sugar daily. 04/29/13   Waldemar Dickens, MD  [facility] DELICA LANCETS 44R MISC 1 Device by Does not apply route as needed (to check blood glucose). 03/09/17   Bufford Lope, DO  ULTICARE INSULIN SYRINGE 30G X 5/16" 1 ML MISC USE AS DIRECTED 02/02/16   Haney, Alyssa A, MD  progesterone (PROMETRIUM) 200 MG capsule Take 1 capsule (200 mg total) by mouth daily. Take for 10 days if bleeding occurs for more than 4 days. 11/09/10 03/28/11  Lyndal Pulley, DO     Critical care time: 38 min  The patient is critically ill with multiple organ systems failure and requires high complexity decision making for assessment and support, frequent evaluation and titration of therapies, application of advanced monitoring technologies and extensive interpretation of multiple databases.   Critical Care Time devoted to patient care services described in this note is 38 Minutes. This time reflects time of care of this signee Dr. Rodman Pickle. This critical care time does not reflect procedure time, or teaching time or supervisory time of PA/NP/Med student/Med Resident etc but could involve care discussion time.  Rodman Pickle, M.D. [facility] Pulmonary/Critical Care Medicine Pager: [phone] After hours pager: [phone]

## 2018-03-01 ENCOUNTER — Inpatient Hospital Stay (HOSPITAL_COMMUNITY): Payer: Medicare Other

## 2018-03-01 DIAGNOSIS — I2699 Other pulmonary embolism without acute cor pulmonale: Secondary | ICD-10-CM

## 2018-03-01 DIAGNOSIS — I361 Nonrheumatic tricuspid (valve) insufficiency: Secondary | ICD-10-CM

## 2018-03-01 LAB — BASIC METABOLIC PANEL
Anion gap: 12 (ref 5–15)
BUN: 22 mg/dL — ABNORMAL HIGH (ref 6–20)
CO2: 28 mmol/L (ref 22–32)
Calcium: 8.4 mg/dL — ABNORMAL LOW (ref 8.9–10.3)
Chloride: 98 mmol/L (ref 98–111)
Creatinine, Ser: 5.71 mg/dL — ABNORMAL HIGH (ref 0.44–1.00)
GFR calc Af Amer: 9 mL/min — ABNORMAL LOW (ref >60)
GFR calc non Af Amer: 7 mL/min — ABNORMAL LOW (ref >60)
Glucose, Bld: 141 mg/dL — ABNORMAL HIGH (ref 70–99)
Potassium: 4 mmol/L (ref 3.5–5.1)
Sodium: 138 mmol/L (ref 135–145)

## 2018-03-01 LAB — LACTIC ACID, PLASMA: Lactic Acid, Venous: 0.9 mmol/L (ref 0.5–1.9)

## 2018-03-01 LAB — TROPONIN I
Troponin I: 0.95 ng/mL (ref <0.03)
Troponin I: 0.75 ng/mL (ref <0.03)
Troponin I: 0.63 ng/mL (ref <0.03)

## 2018-03-01 LAB — CBC
HCT: 33.8 % — ABNORMAL LOW (ref 36.0–46.0)
Hemoglobin: 10.3 g/dL — ABNORMAL LOW (ref 12.0–15.0)
MCH: 31.8 pg (ref 26.0–34.0)
MCHC: 30.5 g/dL (ref 30.0–36.0)
MCV: 104.3 fL — ABNORMAL HIGH (ref 80.0–100.0)
Platelets: 290 10*3/uL (ref 150–400)
RBC: 3.24 MIL/uL — ABNORMAL LOW (ref 3.87–5.11)
RDW: 15 % (ref 11.5–15.5)
WBC: 35.1 10*3/uL — ABNORMAL HIGH (ref 4.0–10.5)
nRBC: 0.1 % (ref 0.0–0.2)

## 2018-03-01 LAB — PROTIME-INR
INR: 1.29
Prothrombin Time: 15.9 seconds — ABNORMAL HIGH (ref 11.4–15.2)

## 2018-03-01 LAB — HEPARIN LEVEL (UNFRACTIONATED)
Heparin Unfractionated: 0.29 IU/mL — ABNORMAL LOW (ref 0.30–0.70)
Heparin Unfractionated: 0.31 IU/mL (ref 0.30–0.70)

## 2018-03-01 LAB — GLUCOSE, CAPILLARY
Glucose-Capillary: 156 mg/dL — ABNORMAL HIGH (ref 70–99)
Glucose-Capillary: 123 mg/dL — ABNORMAL HIGH (ref 70–99)
Glucose-Capillary: 131 mg/dL — ABNORMAL HIGH (ref 70–99)
Glucose-Capillary: 120 mg/dL — ABNORMAL HIGH (ref 70–99)
Glucose-Capillary: 129 mg/dL — ABNORMAL HIGH (ref 70–99)

## 2018-03-01 LAB — PHOSPHORUS: Phosphorus: 5.9 mg/dL — ABNORMAL HIGH (ref 2.5–4.6)

## 2018-03-01 LAB — ECHOCARDIOGRAM COMPLETE
Height: 64 in
Weight: 3873.04 oz

## 2018-03-01 LAB — MAGNESIUM: Magnesium: 2 mg/dL (ref 1.7–2.4)

## 2018-03-01 MED ORDER — CEFAZOLIN SODIUM-DEXTROSE 2-4 GM/100ML-% IV SOLN
2.0000 g | INTRAVENOUS | Status: DC
  Administered 2018-03-01 – 2018-03-04 (×2): 2 g via INTRAVENOUS
  Filled 2018-03-01 (×2): qty 100

## 2018-03-01 MED ORDER — METRONIDAZOLE IN NACL 5-0.79 MG/ML-% IV SOLN
500.0000 mg | Freq: Three times a day (TID) | INTRAVENOUS | Status: DC
  Administered 2018-03-01 – 2018-03-04 (×8): 500 mg via INTRAVENOUS
  Filled 2018-03-01 (×9): qty 100

## 2018-03-01 MED ORDER — CHLORHEXIDINE GLUCONATE 0.12 % MT SOLN
OROMUCOSAL | Status: AC
  Filled 2018-03-01: qty 15

## 2018-03-01 MED ORDER — PRO-STAT SUGAR FREE PO LIQD: 30.0000 mL | Freq: Two times a day (BID) | ORAL | Status: DC

## 2018-03-01 MED ORDER — CHLORHEXIDINE GLUCONATE CLOTH 2 % EX PADS
6.0000 | MEDICATED_PAD | Freq: Every day | CUTANEOUS | Status: DC
  Administered 2018-03-03: 6 via TOPICAL

## 2018-03-01 MED ORDER — INSULIN ASPART 100 UNIT/ML ~~LOC~~ SOLN
0.0000 [IU] | Freq: Three times a day (TID) | SUBCUTANEOUS | Status: DC
  Administered 2018-03-01: 1 [IU] via SUBCUTANEOUS
  Administered 2018-03-02 (×4): 2 [IU] via SUBCUTANEOUS
  Administered 2018-03-03: 1 [IU] via SUBCUTANEOUS
  Administered 2018-03-03: 3 [IU] via SUBCUTANEOUS
  Administered 2018-03-03 (×2): 2 [IU] via SUBCUTANEOUS
  Administered 2018-03-04: 5 [IU] via SUBCUTANEOUS
  Administered 2018-03-04: 2 [IU] via SUBCUTANEOUS
  Administered 2018-03-04 – 2018-03-05 (×6): 3 [IU] via SUBCUTANEOUS
  Administered 2018-03-06: 5 [IU] via SUBCUTANEOUS

## 2018-03-01 MED ORDER — PERFLUTREN LIPID MICROSPHERE
INTRAVENOUS | Status: AC
  Filled 2018-03-01: qty 10

## 2018-03-01 MED ORDER — PERFLUTREN LIPID MICROSPHERE
1.0000 mL | INTRAVENOUS | Status: AC | PRN
  Administered 2018-03-01: 3 mL via INTRAVENOUS
  Filled 2018-03-01: qty 10

## 2018-03-01 MED ORDER — VANCOMYCIN HCL IN DEXTROSE 1-5 GM/200ML-% IV SOLN
INTRAVENOUS | Status: AC
  Administered 2018-03-01: 1000 mg
  Filled 2018-03-01: qty 200

## 2018-03-01 MED ORDER — LABETALOL HCL 5 MG/ML IV SOLN
10.0000 mg | Freq: Once | INTRAVENOUS | Status: AC
  Administered 2018-03-01: 10 mg via INTRAVENOUS
  Filled 2018-03-01: qty 4

## 2018-03-01 MED FILL — Medication: Qty: 1 | Status: AC

## 2018-03-01 NOTE — Progress Notes (Signed)
CRITICAL VALUE ALERT  Critical Value:  Trop: 0.95  Date & Time Notied:  03/01/2018 @ 3220  Provider Notified: Rahul, PA  Orders Received/Actions taken: No new orders

## 2018-03-01 NOTE — Procedures (Signed)
Extubation Procedure Note  Patient Details:   Name: Rachel Chaney DOB: 1960/02/20 MRN: [id number]   Airway Documentation:  Airway 7.5 mm (Active)  Secured at (cm) 24 cm 03/01/2018  7:50 AM  Measured From Lips 03/01/2018  7:50 AM  [place] 03/01/2018  7:50 AM  Secured By [employer] 03/01/2018  7:50 AM  Tube Holder Repositioned Yes 03/01/2018  7:50 AM  Cuff Pressure (cm H2O) 25 cm H2O 03/01/2018  7:50 AM  Site Condition Dry 03/01/2018  7:50 AM   Vent end date: (not recorded) Vent end time: (not recorded)   Evaluation  O2 sats: stable throughout Complications: No apparent complications Patient did tolerate procedure well. Bilateral Breath Sounds: Clear, Diminished   Yes, patient able to speak.  Patient extubated at [id number] per MD order. Patient placed on 4L [place]. Patient tolerated procedure well. Vitals stable.   Seward Speck 03/01/2018, 9:46 AM

## 2018-03-01 NOTE — Progress Notes (Signed)
ANTICOAGULATION CONSULT NOTE - Follow Up Consult  Pharmacy Consult for IV Heparin Indication: DVT  No Known Allergies  Patient Measurements: Height: 5\' 4"  (162.6 cm) Weight: 242 lb 1 oz (109.8 kg) IBW/kg (Calculated) : 54.7 Heparin Dosing Weight: 80 kg  Vital Signs: Temp: 98.5 F (36.9 C) (02/21 0700) Temp Source: Axillary (02/21 0700) BP: 129/37 (02/21 0800) Pulse Rate: 73 (02/21 0800)  Labs: Recent Labs    02/27/18 0801 02/28/18 0524 02/28/18 0722 02/28/18 1720 02/28/18 2232 03/01/18 0659 03/01/18 0702  HGB 11.9* 11.2*  --  12.6  --  10.3*  --   HCT 39.2 38.0  --  37.0  --  33.8*  --   PLT 299 275  --   --   --  290  --   HEPARINUNFRC  --   --   --   --   --  0.29*  --   CREATININE 6.27* 7.97* 7.84*  --  5.28* 5.71*  --   TROPONINI  --   --   --   --   --   --  0.95*    Estimated Creatinine Clearance: 12.8 mL/min (A) (by C-G formula based on SCr of 5.71 mg/dL (H)).   Medications:  Infusions:  . sodium chloride 10 mL/hr at 03/01/18 0800  . sodium chloride    . cefTRIAXone (ROCEPHIN)  IV Stopped (02/28/18 2133)  . fentaNYL infusion INTRAVENOUS 50 mcg/hr (03/01/18 0850)  . heparin 1,250 Units/hr (03/01/18 0800)  . norepinephrine (LEVOPHED) Adult infusion Stopped (03/01/18 0534)  . vancomycin Stopped (02/28/18 1134)    Assessment: 58 year old female on IV heparin for R-upper extremity DVT. CT Angio pending.   Heparin level is slightly sub-therapeutic after initial bolus and rate of 1250 units/hr.  No bleeding noted. H/H slightly decreased. Platelets are within normal limit.   Goal of Therapy:  Heparin level 0.3-0.7 units/ml Monitor platelets by anticoagulation protocol: Yes   Plan:  Increase Heparin rate to 1300 units/hr Recheck Heparin level in 8 hours.  Daily Heparin level and CBC while on therapy.   Sloan Leiter, PharmD, BCPS, BCCCP Clinical Pharmacist Please refer to [facility] for [place] numbers 03/01/2018,9:13 AM

## 2018-03-01 NOTE — Progress Notes (Signed)
  Echocardiogram 2D Echocardiogram has been performed.  Rachel Chaney 03/01/2018, 12:14 PM

## 2018-03-01 NOTE — Progress Notes (Signed)
Bilateral upper extremities venous duplex exam completed. Please see preliminary notes on CV PROC under chart review/ Kenzo Ozment H Marcellus Pulliam(RDMS RVT) 03/01/18 11:00 AM

## 2018-03-01 NOTE — Progress Notes (Signed)
100 cc's of Fentanyl IV wasted in sharps with Martinique Allen, RN Fortino Sic, RN, BSN 03/01/2018 8:48 PM

## 2018-03-01 NOTE — Progress Notes (Signed)
NAME:  GERALINE HALBERSTADT, MRN:  [id number], DOB:  1960/12/27, LOS: 4 ADMISSION DATE:  02/25/2018, CONSULTATION DATE:  02/28/18 REFERRING MD: Josephine Igo, MD CHIEF COMPLAINT:  S/p cardiac arrest  Brief History   58 year old female with ESRD who presented 2/17 for foot injury and admitted for amputation of 2nd and 3rd digit of right foot on 2/19. No post-op complications. Found unresponsive during dialysis on 2/20, CPR performed with ROSC. Transferred to ICU   History of present illness   58 year old female who admitted for crush injury of foot. 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 Wound culture grew S. Aureus with sensitivities pending. During dialysis, patient was found unresponsive. CPR was performed for 6 minutes with ROSC. She received epi x1 and sodium bicarb x 1. She was intubated and transferred to ICU.  Past Medical History  ESRD on HD, HTN, DM2, GERD  Significant Hospital Events   2/17 > admit 2/19 > Toe amputation on right foot 2/20 > Cardiac arrest, intubation, transferred to ICU 2/20 > started on heparin gtt empirically for presumed PE (IV team noted thrombus in RUE cephalic vein)  Consults:  PCCM Nephro  Procedures:  ETT 2/20 > R fem CVL 2/20 >  R fem art line 2/20 > 2/20 (malfunctioned overnight) R radial art line 2/21 >   Significant Diagnostic Tests:  Right Foot XR 2/17 > displaced fracture of 1st and 2nd proximal phalanx  ABI 2/19 > right and left ABI indicates noncompressible RLE, unable to obtain TBI due to movement. Echo 2/20 >  LE duplex 2/20 > no DVT. UE duplex 2/21 >   Micro Data:  Wound culture 2/19 > S. Aureus. Susceptibilities pending Blood 2/21 >   Antimicrobials:  Vanc Ceftriaxone  Interim history/subjective:  Started on heparin overnight for presumed PE. UE duplex and echo pending. Awake this AM, following basic commands.  Objective   Blood pressure (!) 129/37, pulse 73, temperature 98.5 F (36.9 C),  temperature source Axillary, resp. rate 16, height 5\' 4"  (1.626 m), weight 109.8 kg, last menstrual period 09/18/2011, SpO2 99 %.    Vent Mode: PRVC FiO2 (%):  [40 %-100 %] 40 % Set Rate:  [16 bmp] 16 bmp Vt Set:  [440 mL] 440 mL PEEP:  [5 cmH20-10 cmH20] 5 cmH20 Plateau Pressure:  [21 cmH20-27 cmH20] 27 cmH20   Intake/Output Summary (Last 24 hours) at 03/01/2018 1937 Last data filed at 03/01/2018 0600 Gross per 24 hour  Intake 2428.96 ml  Output 2378 ml  Net 50.96 ml   Filed Weights   02/26/18 1628 02/28/18 0706 02/28/18 1117  Weight: 109.6 kg 112.6 kg 109.8 kg   Physical Exam: General: MO, Chronically ill-appearing female, in NAD HENT: [place], AT, ETT in place Eyes: EOMI, no scleral icterus Respiratory: Clear to auscultation bilaterally.  No crackles, wheezing or rales Cardiovascular: RRR, no M/R/G GI: BS+, soft, nontender Extremities:-Edema,-tenderness Neuro: sedated but follows basic commands Skin: Intact, no rashes or bruising  Assessment & Plan:   S/p cardiac arrest - Unclear etiology. Consider MI, arrhythmia, PE. Family report episode of near syncope post-procedure. Thrombus in RUE cephalic vein noted by IV team 2/20; therefore, started on empiric heparin gtt for presumed PE.  LE duplex negative. - No indication for hypothermia protocol. - Continue heparin gtt for now. - f/u on echo, UE duplex. - CTA at some point once IV access obtained. - Trend troponin.  Hypotension - Septic from foot fx /  ulcer as well as component volume depletion from dialysis.  Can not rule out component cardiogenic and / or obstructive (? PE). - Levophed as needed for goal MAP > 65 (has been off since early AM 2/21). - Continue abx. - Follow cultures.  Acute hypoxemic failure due to inability to protect airway - s/p intubation 2/20 - SBT now, she is able to follow commands but low Vt might prohibit extubation. - Bronchial hygiene. - CXR intermittently.  Right toe fractures with soft tissue  infection - Cx + S.Aureus. - Continue Vanc and Ceftriaxone. - F/u culture sensitivities.  Best practice:  Diet: NPO Pain/Anxiety/Delirium protocol (if indicated): Fentanyl gtt and PRN versed VAP protocol (if indicated): Yes DVT prophylaxis: Heparin gtt / SCD's GI prophylaxis: PPI Glucose control: SSI Mobility: BR Code Status: Full Family Communication: Husband Disposition: ICU   CC time: 30 min.   Montey Hora, [place] Pulmonary & Critical Care Medicine Pager: [phone].  If no answer, [phone] 03/01/2018, 8:44 AM

## 2018-03-01 NOTE — Progress Notes (Signed)
Pharmacy Antibiotic Note  Rachel Chaney is a 58 y.o. female admitted on 02/25/2018 with MSSA foot infection, osteomyelitis.  Pharmacy has been consulted for Cefazolin dosing.  Patient on Vancomycin and Ceftriaxone for osteo of foot.  Patient went for amputation on 2/19 and had very large deep abscess.   Culture is growing MSSA but remains pending.  WBC is up today at 35.1. Patient is afebrile.  Patient has ESRD receiving HD MWF. Has been off schedule, but returning to MWF schedule today.  Flagyl added to cover anaerobes until culture results.   Plan: Cefazolin 2g IV post-HD Monitor culture for finalization.  Monitor clinical status and HD schedule/toleration.   Height: 5\' 4"  (162.6 cm) Weight: 242 lb 1 oz (109.8 kg) IBW/kg (Calculated) : 54.7  Temp (24hrs), Avg:98.8 F (37.1 C), Min:98.3 F (36.8 C), Max:99.3 F (37.4 C)  Recent Labs  Lab 02/25/18 1105 02/26/18 0348 02/27/18 0801 02/28/18 0524 02/28/18 0722 02/28/18 2232 03/01/18 0659  WBC 27.0* 29.3* 32.1* 30.7*  --   --  35.1*  CREATININE 8.98* 9.62* 6.27* 7.97* 7.84* 5.28* 5.71*  LATICACIDVEN  --   --   --   --   --  0.8 0.9    Estimated Creatinine Clearance: 12.8 mL/min (A) (by C-G formula based on SCr of 5.71 mg/dL (H)).    No Known Allergies  Antimicrobials this admission: Vancomycin 2/17 >> 2/21 Ceftriaxone 2/17 >> 2/21 Ancef 2/21 >> Flagyl 2/21 >>   Dose adjustments this admission:   Microbiology results: 2/21 BCx >> 2/21 Abscess R-Foot >> mod MSSA, still pending 2/18 MRSA pcr negative; St aureus +   Thank you for allowing pharmacy to be a part of this patient's care.  Sloan Leiter, PharmD, BCPS, BCCCP Clinical Pharmacist Please refer to [facility] for [place] numbers 03/01/2018 1:38 PM

## 2018-03-01 NOTE — Evaluation (Signed)
Physical Therapy Evaluation Patient Details Name: Rachel Chaney MRN: [id number] DOB: 1960/10/02 Today's Date: 03/01/2018   History of Present Illness  58 year old female who admitted for crush injury of foot. 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 Wound culture grew S. Aureus with sensitivities pending. During dialysis, patient was found unresponsive. CPR was performed, she was intubated 2/20 and transferred to ICU. Presumed PE (IV team noted thrombus in RUE cephalic vein) Extubated 09/12/98 PMH: ESRD on HD, HTN, DM2, GERD  Clinical Impression  PTA pt living with husband in single story home with 3 steps to enter and was independent with mobility and iADLs. Pt currently limited in safe mobility by increased BP, decreased cognition and NWB through R LE. Pt currently requires modAx2 for bed mobility. Pt recently extubated and will receive HD this afternoon. Given pt PLOF expect pt to respond well to therapy and d/c home, however PT will reassess in session tomorrow.     Follow Up Recommendations Home health PT;Supervision/Assistance - 24 hour    Equipment Recommendations  None recommended by PT    Recommendations for Other Services OT consult     Precautions / Restrictions Precautions Required Braces or Orthoses: (wound vac on R foot) Restrictions Weight Bearing Restrictions: Yes RLE Weight Bearing: Non weight bearing      Mobility  Bed Mobility Overal bed mobility: Needs Assistance Bed Mobility: Supine to Sit;Sit to Supine     Supine to sit: Mod assist Sit to supine: Mod assist   General bed mobility comments: modAx2 for LE off bed and trunk to upright, modAx2 for managment of LE back into bed and trunk aligned, Pt attempted to utilize LE to help with pushing up in the bed.   Transfers                 General transfer comment: unable      Balance Overall balance assessment: Needs assistance Sitting-balance support: Bilateral upper  extremity supported;Feet unsupported Sitting balance-Leahy Scale: Poor Sitting balance - Comments: requires modA for maintaining seated EoB for 5 minutes                                     Pertinent Vitals/Pain Pain Assessment: No/denies pain    Home Living Family/patient expects to be discharged to:: Private residence Living Arrangements: Spouse/significant other Available Help at Discharge: Family Type of Home: House Home Access: Stairs to enter Entrance Stairs-Rails: Right Entrance Stairs-Number of Steps: 3 Home Layout: One level Home Equipment: Walker - 2 wheels      Prior Function Level of Independence: Independent                  Extremity/Trunk Assessment   Upper Extremity Assessment Upper Extremity Assessment: Overall WFL for tasks assessed;Difficult to assess due to impaired cognition    Lower Extremity Assessment Lower Extremity Assessment: RLE deficits/detail;Difficult to assess due to impaired cognition RLE Deficits / Details: R foot wrapped in [place] with wound vac in place, ROM of knee and hip WFL       Communication   Communication: Expressive difficulties(mumbling)  Cognition Arousal/Alertness: Awake/alert Behavior During Therapy: Restless Overall Cognitive Status: Impaired/Different from baseline Area of Impairment: Orientation;Attention;Memory;Following commands;Safety/judgement;Awareness                 Orientation Level: Place;Time;Situation;Disoriented to Current Attention Level: Sustained  General Comments: Pt pleasantly confused, able to answer questions correctly 75% of the time.       General Comments General comments (skin integrity, edema, etc.): Pt on 5L O via [place] able to maintain >93%O2 throughout session, however pt exhibits increased work of breathing and accessory muscle involvement although RR max of 22, in supine BP 143/107, HR 95bpm, in seated BP 179/132, HR 112 bpm. Pt very restless  throughout session with constant movement of her LE. Pt daughter and husband present throughout session      Assessment/Plan    PT Assessment Patient needs continued PT services  PT Problem List Decreased cognition;Decreased coordination;Decreased safety awareness;Decreased mobility;Cardiopulmonary status limiting activity       PT Treatment Interventions DME instruction;Gait training;Stair training;Functional mobility training;Therapeutic activities;Therapeutic exercise;Balance training;Cognitive remediation;Patient/family education    PT Goals (Current goals can be found in the Care Plan section)  Acute Rehab PT Goals Patient Stated Goal: none stated PT Goal Formulation: With family Time For Goal Achievement: 03/15/18 Potential to Achieve Goals: Fair    Frequency Min 3X/week    AM-PAC PT "6 Clicks" Mobility  Outcome Measure Help needed turning from your back to your side while in a flat bed without using bedrails?: A Lot Help needed moving from lying on your back to sitting on the side of a flat bed without using bedrails?: A Lot Help needed moving to and from a bed to a chair (including a wheelchair)?: Total Help needed standing up from a chair using your arms (e.g., wheelchair or bedside chair)?: Total Help needed to walk in hospital room?: Total Help needed climbing 3-5 steps with a railing? : Total 6 Click Score: 8    End of Session Equipment Utilized During Treatment: Oxygen Activity Tolerance: Treatment limited secondary to medical complications (Comment)(increased BP in sitting) Patient left: in bed;with call bell/phone within reach;with SCD's reapplied;with family/visitor present;with bed alarm set Nurse Communication: Mobility status;Other (comment)(increased diastolic BP) PT Visit Diagnosis: Muscle weakness (generalized) (M62.81);Other symptoms and signs involving the nervous system (R29.898);Other abnormalities of gait and mobility (R26.89);Difficulty in walking,  not elsewhere classified (R26.2)    Time: 1330-1401 PT Time Calculation (min) (ACUTE ONLY): 31 min   Charges:   PT Evaluation $PT Eval Moderate Complexity: 1 Mod PT Treatments $Therapeutic Activity: 8-22 mins        Kaliegh Willadsen B. Migdalia Dk PT, DPT Acute Rehabilitation Services Pager [phone] Office [phone]   [place] 03/01/2018, 4:40 PM

## 2018-03-01 NOTE — Progress Notes (Addendum)
PT Cancellation Note  Patient Details Name: BRISSIA DELISA MRN: [id number] DOB: [birth year]   Cancelled Treatment:    Reason Eval/Treat Not Completed: (P) Medical issues which prohibited therapy Pt with elevated troponin and subtherapeutic heparin. PT will follow back as able.  Daven Montz B. Migdalia Dk PT, DPT Acute Rehabilitation Services Pager [phone] Office [phone]    [place] 03/01/2018, 8:34 AM

## 2018-03-01 NOTE — Progress Notes (Signed)
[facility] Progress Note   Subjective:  1.9 L off yest on HD, one BP drop into the 90's.  In the afternoon pt was found poorly responsive and code called, underwent CPR x 6 min, got epi/ NaHCo3 one each and was intubated and recovered. Moved to ICU.  CXR showing bibasilar infiltrates.  Tm 99.3, on IV vanc/ rocephin for foot infection.  Objective Vitals:   03/01/18 0730 03/01/18 0745 03/01/18 0750 03/01/18 0800  BP: (!) 78/55   (!) 129/37  Pulse: 67 73 77 73  Resp: 16 16 (!) 22 16  Temp:      TempSrc:      SpO2: 98% 99% 98% 99%  Weight:      Height:       Physical Exam General: in ICU, extubated, alert and responsive, nasal O2 Neck: appears +jvd Heart: RRR; no murmur Lungs: CTA anteriorly Abdomen: soft, non-tender, nd +bs Extremities: R foot bandaged with wound VAC; LLE without edema Dialysis Access: L AVF + thrill   [facility]WF [facility]  4h  400/800  109kg  2/2 bath  LUE AVF  Hep 8000 + 2000 prn Calcitriol: 1.39mg PO qHD Last Labs:01/28/2018:Hgb 12.1, TSAT30.0, K4.7, Ca8.2, P6.3, PTH1028.0, Alb3.6.  Assessment/Plan: 1. Cardiac arrest: ROSC after 6 min. On empiric IV hep for poss PE.  2. VDRF: extubated this morning. Stable on [place].  3. Pulm infiltrates: may be vol vs infection, not clear on exam or imaging. Did get volume this am w/ hypotension. Plan UF as tol on HD today.   4. Osteomyelitis R 2nd/3rd toes: WBC ~35. S/p 2nd/3rd ray amputation 2/19 - wound vac in place. On Vanc/Ceftriaxone.   5. ESRD: had HD yesterday off schedule. HD today.  6. Hypertension/volume: BP 7. Anemiaof CKD: Hgb 11.2, monitor without ESA. 8. Secondary Hyperparathyroidism: Corr Ca ok, Phos ^ - continue Auryxia as binder and VDRA 9. Nutrition: Alb 2.3 - continue pro-stat supplements. 10. T2DM: Insulin per primary team   Pt seen, examined and agree w A/P as above.  [facility] 03/01/2018, 11:27 AM      Additional Objective Labs: Basic Metabolic  Panel: Recent Labs  Lab 02/28/18 0722 02/28/18 1720 02/28/18 2232 03/01/18 0659  NA 133* 135 136 138  K 4.4 3.9 4.1 4.0  CL 91*  --  96* 98  CO2 25  --  27 28  GLUCOSE 198*  --  217* 141*  BUN 40*  --  20 22*  CREATININE 7.84*  --  5.28* 5.71*  CALCIUM 8.3*  --  8.1* 8.4*  PHOS 7.9*  --   --  5.9*   Liver Function Tests: Recent Labs  Lab 02/25/18 1105 02/28/18 0722 02/28/18 2232  AST 15  --  145*  ALT 18  --  96*  ALKPHOS 85  --  173*  BILITOT 0.6  --  1.4*  PROT 6.9  --  6.6  ALBUMIN 2.6* 2.3* 2.1*   CBC: Recent Labs  Lab 02/25/18 1105 02/26/18 0348 02/27/18 0801 02/28/18 0524 02/28/18 1720 03/01/18 0659  WBC 27.0* 29.3* 32.1* 30.7*  --  35.1*  NEUTROABS 22.7*  --  28.9* 26.4*  --   --   HGB 11.1* 10.8* 11.9* 11.2* 12.6 10.3*  HCT 36.9 35.0* 39.2 38.0 37.0 33.8*  MCV 103.1* 102.0* 105.1* 105.8*  --  104.3*  PLT 247 254 299 275  --  290   Blood Culture    Component Value Date/Time   SDES SITE NOT SPECIFIED  03/01/2018 0053   SPECREQUEST  03/01/2018 0053    BOTTLES DRAWN AEROBIC ONLY Blood Culture results may not be optimal due to an excessive volume of blood received in culture bottles Performed at [facility] [address], [place], [place] [address]    CULT NO GROWTH < 12 HOURS 03/01/2018 0053   REPTSTATUS PENDING 03/01/2018 0053    Medications: . sodium chloride 10 mL/hr at 03/01/18 0800  . sodium chloride    . cefTRIAXone (ROCEPHIN)  IV Stopped (02/28/18 2133)  . fentaNYL infusion INTRAVENOUS Stopped (03/01/18 0925)  . heparin 1,250 Units/hr (03/01/18 0800)  . norepinephrine (LEVOPHED) Adult infusion Stopped (03/01/18 0534)  . vancomycin Stopped (02/28/18 1134)   . atorvastatin  80 mg Per Tube Daily  . calcitRIOL  0.25 mcg Oral Daily  . chlorhexidine gluconate (MEDLINE KIT)  15 mL Mouth Rinse BID  . Chlorhexidine Gluconate Cloth  6 each Topical Q0600  . docusate sodium  100 mg Oral BID  . fentaNYL (SUBLIMAZE) injection  50 mcg  Intravenous Once  . ferric citrate  420 mg Oral TID WC  . insulin aspart  0-9 Units Subcutaneous Q4H  . LORazepam  2 mg Oral Once   Or  . LORazepam  2 mg Intramuscular Once  . mouth rinse  15 mL Mouth Rinse 10 times per day  . multivitamin  1 tablet Oral QHS  . mupirocin ointment  1 application Nasal BID  . pantoprazole (PROTONIX) IV  40 mg Intravenous Daily  . senna  1 tablet Oral BID

## 2018-03-01 NOTE — Procedures (Signed)
Arterial Catheter Insertion Procedure Note Rachel Chaney [id number] [birth year]  Procedure: Insertion of Arterial Catheter  Indications: Blood pressure monitoring, Frequent blood sampling and right femoral Arterial line clotted.  Peripheral cuff pressures fluctuating.  Remains on vasopressors.  Procedure Details Consent: Risks of procedure as well as the alternatives and risks of each were explained to the (patient/caregiver).  Consent for procedure obtained. Time Out: Verified patient identification, verified procedure, site/side was marked, verified correct patient position, special equipment/implants available, medications/allergies/relevent history reviewed, required imaging and test results available.  Performed  Maximum sterile technique was used including antiseptics, cap, gloves, gown, hand hygiene, mask and sheet. Skin prep: Chlorhexidine 20 gauge catheter was inserted into right radial artery using the Seldinger technique. ULTRASOUND GUIDANCE USED: YES.  Biopatch and sterile dressing applied.  Evaluation Blood flow good; BP tracing good. Complications: No apparent complications.   Kennieth Rad, MSN, AGACNP-BC [facility] Pgr: [phone] or if no answer [phone] 03/01/2018, 2:29 AM

## 2018-03-01 NOTE — Progress Notes (Signed)
ANTICOAGULATION CONSULT NOTE - Follow Up Consult  Pharmacy Consult for IV Heparin Indication: DVT  No Known Allergies  Patient Measurements: Height: 5\' 4"  (162.6 cm) Weight: 234 lb 9.1 oz (106.4 kg) IBW/kg (Calculated) : 54.7 Heparin Dosing Weight: 80 kg  Vital Signs: Temp: 98.3 F (36.8 C) (02/21 1845) Temp Source: Oral (02/21 1845) BP: 109/67 (02/21 1845) Pulse Rate: 93 (02/21 1900)  Labs: Recent Labs    02/27/18 0801 02/28/18 0524 02/28/18 0722 02/28/18 1720 02/28/18 2232 03/01/18 0659 03/01/18 0702 03/01/18 1515 03/01/18 1850  HGB 11.9* 11.2*  --  12.6  --  10.3*  --   --   --   HCT 39.2 38.0  --  37.0  --  33.8*  --   --   --   PLT 299 275  --   --   --  290  --   --   --   LABPROT  --   --   --   --   --  15.9*  --   --   --   INR  --   --   --   --   --  1.29  --   --   --   HEPARINUNFRC  --   --   --   --   --  0.29*  --   --  0.31  CREATININE 6.27* 7.97* 7.84*  --  5.28* 5.71*  --   --   --   TROPONINI  --   --   --   --   --   --  0.95* 0.75*  --     Estimated Creatinine Clearance: 12.6 mL/min (A) (by C-G formula based on SCr of 5.71 mg/dL (H)).  Assessment: 58 year old female on IV heparin for R-upper extremity DVT.    Heparin level within goal 0.31  Goal of Therapy:  Heparin level 0.3-0.7 units/ml Monitor platelets by anticoagulation protocol: Yes   Plan:  Heparin  1300 units/hr.  Daily Heparin level and CBC while on therapy.   Levester Fresh, PharmD, BCPS, BCCCP Clinical Pharmacist [phone]  Please check AMION for all [facility] numbers  03/01/2018 7:26 PM

## 2018-03-01 NOTE — Progress Notes (Signed)
Subjective: 2 Days Post-Op Procedure(s) (LRB): RIGHT FOOT 2ND AND 3RD RAY AMPUTATION (Right) Remains on VENT support, but arouses to voice and moving around.  Husband at bedside.  Right foot VAC dressing intact and functioning well with scant drainage in VAC canister.   Objective: Vital signs in last 24 hours: Temp:  [98.1 F (36.7 C)-99.9 F (37.7 C)] 98.5 F (36.9 C) (02/21 0700) Pulse Rate:  [67-122] 73 (02/21 0800) Resp:  [14-40] 16 (02/21 0800) BP: (69-160)/(23-78) 129/37 (02/21 0800) SpO2:  [82 %-100 %] 99 % (02/21 0800) Arterial Line BP: (73-199)/(31-81) 137/55 (02/21 0800) FiO2 (%):  [40 %-100 %] 40 % (02/21 0750) Weight:  [109.8 kg] 109.8 kg (02/20 1117)  Intake/Output from previous day: 02/20 0701 - 02/21 0700 In: 2466.3 [I.V.:904.3; NG/GT:100; IV Piggyback:1462] Out: 2378 [Emesis/NG output:450] Intake/Output this shift: Total I/O In: 37.2 [I.V.:37.2] Out: -   Recent Labs    02/27/18 0801 02/28/18 0524 02/28/18 1720 03/01/18 0659  HGB 11.9* 11.2* 12.6 10.3*   Recent Labs    02/28/18 0524 02/28/18 1720 03/01/18 0659  WBC 30.7*  --  35.1*  RBC 3.59*  --  3.24*  HCT 38.0 37.0 33.8*  PLT 275  --  290   Recent Labs    02/28/18 2232 03/01/18 0659  NA 136 138  K 4.1 4.0  CL 96* 98  CO2 27 28  BUN 20 22*  CREATININE 5.28* 5.71*  GLUCOSE 217* 141*  CALCIUM 8.1* 8.4*   No results for input(s): LABPT, INR in the last 72 hours.     Assessment/Plan: 2 Days Post-Op Procedure(s) (LRB): RIGHT FOOT 2ND AND 3RD RAY AMPUTATION (Right) Management per CCM.   VAC dressing to right toe amputation site functioning well.  Would plan to remove dressing late next week if still hospitalized.   Erlinda Hong, PA-C 03/01/2018, 9:18 AM  [place]

## 2018-03-02 ENCOUNTER — Inpatient Hospital Stay (HOSPITAL_COMMUNITY): Payer: Medicare Other

## 2018-03-02 DIAGNOSIS — E662 Morbid (severe) obesity with alveolar hypoventilation: Secondary | ICD-10-CM

## 2018-03-02 DIAGNOSIS — J810 Acute pulmonary edema: Secondary | ICD-10-CM

## 2018-03-02 LAB — CBC
HCT: 38.8 % (ref 36.0–46.0)
Hemoglobin: 11.1 g/dL — ABNORMAL LOW (ref 12.0–15.0)
MCH: 31.5 pg (ref 26.0–34.0)
MCHC: 28.6 g/dL — ABNORMAL LOW (ref 30.0–36.0)
MCV: 110.2 fL — ABNORMAL HIGH (ref 80.0–100.0)
Platelets: 325 10*3/uL (ref 150–400)
RBC: 3.52 MIL/uL — ABNORMAL LOW (ref 3.87–5.11)
RDW: 15.3 % (ref 11.5–15.5)
WBC: 37.4 10*3/uL — ABNORMAL HIGH (ref 4.0–10.5)
nRBC: 0.3 % — ABNORMAL HIGH (ref 0.0–0.2)

## 2018-03-02 LAB — POCT I-STAT 7, (LYTES, BLD GAS, ICA,H+H)
Acid-base deficit: 6 mmol/L — ABNORMAL HIGH (ref 0.0–2.0)
Bicarbonate: 21.8 mmol/L (ref 20.0–28.0)
Calcium, Ion: 1.1 mmol/L — ABNORMAL LOW (ref 1.15–1.40)
HCT: 34 % — ABNORMAL LOW (ref 36.0–46.0)
Hemoglobin: 11.6 g/dL — ABNORMAL LOW (ref 12.0–15.0)
O2 Saturation: 92 %
Patient temperature: 98.2
Potassium: 3.8 mmol/L (ref 3.5–5.1)
Sodium: 136 mmol/L (ref 135–145)
TCO2: 23 mmol/L (ref 22–32)
pCO2 arterial: 50.4 mmHg — ABNORMAL HIGH (ref 32.0–48.0)
pH, Arterial: 7.242 — ABNORMAL LOW (ref 7.350–7.450)
pO2, Arterial: 75 mmHg — ABNORMAL LOW (ref 83.0–108.0)

## 2018-03-02 LAB — BASIC METABOLIC PANEL
Anion gap: 20 — ABNORMAL HIGH (ref 5–15)
BUN: 15 mg/dL (ref 6–20)
CO2: 24 mmol/L (ref 22–32)
Calcium: 8.5 mg/dL — ABNORMAL LOW (ref 8.9–10.3)
Chloride: 94 mmol/L — ABNORMAL LOW (ref 98–111)
Creatinine, Ser: 4.64 mg/dL — ABNORMAL HIGH (ref 0.44–1.00)
GFR calc Af Amer: 11 mL/min — ABNORMAL LOW (ref >60)
GFR calc non Af Amer: 10 mL/min — ABNORMAL LOW (ref >60)
Glucose, Bld: 145 mg/dL — ABNORMAL HIGH (ref 70–99)
Potassium: 3.8 mmol/L (ref 3.5–5.1)
Sodium: 138 mmol/L (ref 135–145)

## 2018-03-02 LAB — MAGNESIUM: Magnesium: 2.1 mg/dL (ref 1.7–2.4)

## 2018-03-02 LAB — GLUCOSE, CAPILLARY
Glucose-Capillary: 156 mg/dL — ABNORMAL HIGH (ref 70–99)
Glucose-Capillary: 160 mg/dL — ABNORMAL HIGH (ref 70–99)
Glucose-Capillary: 166 mg/dL — ABNORMAL HIGH (ref 70–99)
Glucose-Capillary: 167 mg/dL — ABNORMAL HIGH (ref 70–99)
Glucose-Capillary: 172 mg/dL — ABNORMAL HIGH (ref 70–99)
Glucose-Capillary: 186 mg/dL — ABNORMAL HIGH (ref 70–99)

## 2018-03-02 LAB — HEPARIN LEVEL (UNFRACTIONATED)
Heparin Unfractionated: 0.16 IU/mL — ABNORMAL LOW (ref 0.30–0.70)
Heparin Unfractionated: 0.56 IU/mL (ref 0.30–0.70)

## 2018-03-02 LAB — HEPATITIS B SURFACE ANTIGEN: Hepatitis B Surface Ag: NEGATIVE

## 2018-03-02 LAB — PROTIME-INR
INR: 1.3
Prothrombin Time: 16.1 seconds — ABNORMAL HIGH (ref 11.4–15.2)

## 2018-03-02 LAB — PHOSPHORUS: Phosphorus: 6.3 mg/dL — ABNORMAL HIGH (ref 2.5–4.6)

## 2018-03-02 MED ORDER — PANTOPRAZOLE SODIUM 40 MG PO TBEC
40.0000 mg | DELAYED_RELEASE_TABLET | Freq: Every day | ORAL | Status: DC
  Administered 2018-03-03 – 2018-03-14 (×12): 40 mg via ORAL
  Filled 2018-03-02 (×12): qty 1

## 2018-03-02 NOTE — Progress Notes (Signed)
Pt. Lethargic with intermittent disorientation, CCM called to bedside, see new orders.

## 2018-03-02 NOTE — Progress Notes (Signed)
ANTICOAGULATION CONSULT NOTE - Follow Up Consult  Pharmacy Consult for IV Heparin Indication: DVT  No Known Allergies  Patient Measurements: Height: 5\' 4"  (162.6 cm) Weight: 234 lb 9.1 oz (106.4 kg) IBW/kg (Calculated) : 54.7 Heparin Dosing Weight: 80 kg  Vital Signs: Temp: 98.8 F (37.1 C) (02/22 1138) Temp Source: Axillary (02/22 1138) BP: 113/101 (02/22 1800) Pulse Rate: 85 (02/22 1800)  Labs: Recent Labs    02/28/18 0524  02/28/18 2232  03/01/18 0659 03/01/18 0702 03/01/18 1515 03/01/18 1850 03/02/18 0517 03/02/18 0913 03/02/18 1820  HGB 11.2*   < >  --   --  10.3*  --   --   --  11.1* 11.6*  --   HCT 38.0   < >  --   --  33.8*  --   --   --  38.8 34.0*  --   PLT 275  --   --   --  290  --   --   --  325  --   --   LABPROT  --   --   --   --  15.9*  --   --   --  16.1*  --   --   INR  --   --   --   --  1.29  --   --   --  1.30  --   --   HEPARINUNFRC  --   --   --    < > 0.29*  --   --  0.31 0.16*  --  0.56  CREATININE 7.97*   < > 5.28*  --  5.71*  --   --   --  4.64*  --   --   TROPONINI  --   --   --   --   --  0.95* 0.75* 0.63*  --   --   --    < > = values in this interval not displayed.    Estimated Creatinine Clearance: 15.5 mL/min (A) (by C-G formula based on SCr of 4.64 mg/dL (H)).  Assessment: 51 yoF with new DVT and ?PE, continues on IV heparin. Heparin level is now within goal range at 0.56 following rate increase this AM. Hgb low stable, pltc WNL. No bleeding noted.  Goal of Therapy:  Heparin level 0.3-0.7 units/ml Monitor platelets by anticoagulation protocol: Yes   Plan:  Continue heparin gtt at 1550 units/hr Daily heparin level and CBC Monitor for s/sx of bleeding F/u CTA results  Thank you for allowing pharmacy to participate in this patient's care.   Mila Merry Gerarda Fraction, PharmD, [place] PGY2 Infectious Diseases Pharmacy Resident Phone: [phone] 03/02/2018 7:01 PM

## 2018-03-02 NOTE — Progress Notes (Addendum)
[facility] Progress Note   Subjective: Minimally responsive to verbal/tactile stimuli. Not following commands.   Objective Vitals:   03/02/18 1100 03/02/18 1138 03/02/18 1200 03/02/18 1300  BP: 114/61 116/76 (!) 131/47 (!) 119/50  Pulse: 85 90 86 83  Resp: 20 (!) 23 (!) 22 16  Temp:  98.8 F (37.1 C)    TempSrc:  Axillary    SpO2: 98% 96% 94% 95%  Weight:      Height:       Physical Exam General: Obese, chronically ill appearing female in NAD Heart: S1,S2, HS distant. SR on monitor.  Lungs: Bilateral breath sounds, decreased in bases, CTAB at present. Abdomen: S, NT Extremities: No LE edema. Wound vac in place R foot.  Dialysis Access: L AVF + bruit  Additional Objective Labs: Basic Metabolic Panel: Recent Labs  Lab 02/28/18 0722  02/28/18 2232 03/01/18 0659 03/02/18 0517 03/02/18 0913  NA 133*   < > 136 138 138 136  K 4.4   < > 4.1 4.0 3.8 3.8  CL 91*  --  96* 98 94*  --   CO2 25  --  '27 28 24  ' --   GLUCOSE 198*  --  217* 141* 145*  --   BUN 40*  --  20 22* 15  --   CREATININE 7.84*  --  5.28* 5.71* 4.64*  --   CALCIUM 8.3*  --  8.1* 8.4* 8.5*  --   PHOS 7.9*  --   --  5.9* 6.3*  --    < > = values in this interval not displayed.   Liver Function Tests: Recent Labs  Lab 02/25/18 1105 02/28/18 0722 02/28/18 2232  AST 15  --  145*  ALT 18  --  96*  ALKPHOS 85  --  173*  BILITOT 0.6  --  1.4*  PROT 6.9  --  6.6  ALBUMIN 2.6* 2.3* 2.1*   No results for input(s): LIPASE, AMYLASE in the last 168 hours. CBC: Recent Labs  Lab 02/25/18 1105 02/26/18 0348 02/27/18 0801 02/28/18 0524  03/01/18 0659 03/02/18 0517 03/02/18 0913  WBC 27.0* 29.3* 32.1* 30.7*  --  35.1* 37.4*  --   NEUTROABS 22.7*  --  28.9* 26.4*  --   --   --   --   HGB 11.1* 10.8* 11.9* 11.2*   < > 10.3* 11.1* 11.6*  HCT 36.9 35.0* 39.2 38.0   < > 33.8* 38.8 34.0*  MCV 103.1* 102.0* 105.1* 105.8*  --  104.3* 110.2*  --   PLT 247 254 299 275  --  290 325  --    < > =  values in this interval not displayed.   Blood Culture    Component Value Date/Time   SDES BLOOD A-LINE 03/01/2018 0659   SPECREQUEST  03/01/2018 0659    BOTTLES DRAWN AEROBIC AND ANAEROBIC Blood Culture adequate volume   CULT NO GROWTH < 12 HOURS 03/01/2018 0659   REPTSTATUS PENDING 03/01/2018 0659    Cardiac Enzymes: Recent Labs  Lab 03/01/18 0702 03/01/18 1515 03/01/18 1850  TROPONINI 0.95* 0.75* 0.63*   CBG: Recent Labs  Lab 03/01/18 1202 03/01/18 1524 03/01/18 1947 03/02/18 0759 03/02/18 1214  GLUCAP 131* 120* 129* 167* 186*   Iron Studies: No results for input(s): IRON, TIBC, TRANSFERRIN, FERRITIN in the last 72 hours. '@[email or web address]' @ Studies/Results: Dg Chest Port 1 View  Result Date: 03/02/2018 CLINICAL DATA:  Follow-up infiltrates EXAM: PORTABLE CHEST 1 VIEW COMPARISON:  03/01/2018  FINDINGS: Endotracheal tube and NG tubes removed Bilateral airspace disease with mild interval improvement. No effusion. IMPRESSION: Improvement in bilateral airspace disease following extubation. Electronically Signed   By: Franchot Gallo M.D.   On: 03/02/2018 10:36   Portable Chest Xray  Result Date: 03/01/2018 CLINICAL DATA:  Intubation.  Cardiac arrest. EXAM: PORTABLE CHEST 1 VIEW COMPARISON:  03/01/2018. FINDINGS: Endotracheal tube and NG tube in stable position. Cardiomegaly. Diffuse bilateral pulmonary infiltrates/edema. No pleural effusion or pneumothorax. IMPRESSION: 1.  Lines and tubes in stable position. 2. Cardiomegaly. Diffuse bilateral pulmonary infiltrates/edema. Small bilateral pleural effusions. 3.  Persistent prominent bibasilar atelectasis. Electronically Signed   By: Marcello Moores  Register   On: 03/01/2018 06:44   Dg Chest Port 1 View  Result Date: 03/01/2018 CLINICAL DATA:  58 year old female with end-stage renal disease, admitted for foot amputation. Found unresponsive on dialysis yesterday status post CPR. EXAM: PORTABLE CHEST 1 VIEW COMPARISON:  02/28/2018 and earlier.  FINDINGS: Portable AP semi upright view at 0022 hours. Intubated with stable endotracheal tube tip at the level the clavicles. Enteric tube courses to the abdomen, tip not included. Confluent bibasilar opacity which more resembles airspace disease than pleural fluid. Streaky bilateral perihilar opacity resembling atelectasis. No overt edema. The upper lungs are clear. No pneumothorax. Stable cardiac size and mediastinal contours. IMPRESSION: 1.  Stable lines and tubes. 2. Bilateral lower lobe collapse or consolidation suspected with superimposed perihilar atelectasis. Electronically Signed   By: Genevie Ann M.D.   On: 03/01/2018 00:40   Portable Chest X-ray  Result Date: 02/28/2018 CLINICAL DATA:  Endotracheal tube adjustment. EXAM: PORTABLE CHEST 1 VIEW COMPARISON:  02/28/2018 at 1503 hours FINDINGS: The endotracheal tube has been retracted and now terminates approximately 7 mm above the carina. There is improved aeration of the left lung with residual patchy opacities in the mid upper lung and denser consolidation or atelectasis in the left lung base. Patchy opacities are also present in the right lung base. No pneumothorax is identified. Enteric tube courses into the abdomen with tip not imaged. IMPRESSION: 1. Interval retraction of endotracheal tube, which now terminates 7 mm above the carina. 2. Improved aeration of the left lung. Electronically Signed   By: Logan Bores M.D.   On: 02/28/2018 16:15   Dg Chest Port 1 View  Result Date: 02/28/2018 CLINICAL DATA:  Unresponsive. EXAM: PORTABLE CHEST 1 VIEW FINDINGS: Endotracheal tube noted with tip in the mainstem bronchus. Proximal repositioning of approximately 7 cm should be considered. NG tube noted tip below left hemidiaphragm. Opacification left hemithorax noted this is most likely related atelectasis and or consolidation. Atelectatic changes and/or infiltrates noted right lung. Heart size is difficult to assess. Degenerative changes and scoliosis  thoracic spine. IMPRESSION: 1. Endotracheal tube tip noted in the right mainstem bronchus. Proximal repositioning of approximately 7 cm should be considered. NG tube noted tip below left hemidiaphragm. 2. Complete opacification of the left hemithorax most likely related to atelectasis and or consolidation. Atelectatic changes and/or infiltrates are also noted in the right lung. Critical Value/emergent results were called by telephone at the time of interpretation on 02/28/2018 at 3:24 pm to nurse Tammy , who verbally acknowledged these results. Electronically Signed   By: Marcello Moores  Register   On: 02/28/2018 15:30   Vas [place] Lower Extremity Venous (dvt)  Result Date: 03/01/2018  Lower Venous Study Indications: Cardiopulmonary arrest.  Limitations: Body habitus, poor ultrasound/tissue interface, bandages and line. Performing Technologist: Oliver Hum RVT  Examination Guidelines: A complete evaluation includes B-mode imaging,  spectral Doppler, color Doppler, and power Doppler as needed of all accessible portions of each vessel. Bilateral testing is considered an integral part of a complete examination. Limited examinations for reoccurring indications may be performed as noted.  Right Venous Findings: +---------+---------------+---------+-----------+----------+--------------+          CompressibilityPhasicitySpontaneityPropertiesSummary        +---------+---------------+---------+-----------+----------+--------------+ CFV                                                   Not visualized +---------+---------------+---------+-----------+----------+--------------+ SFJ                                                   Not visualized +---------+---------------+---------+-----------+----------+--------------+ FV Prox  Full                                                        +---------+---------------+---------+-----------+----------+--------------+ FV Mid   Full                                                         +---------+---------------+---------+-----------+----------+--------------+ FV DistalFull                                                        +---------+---------------+---------+-----------+----------+--------------+ PFV                                                   Not visualized +---------+---------------+---------+-----------+----------+--------------+ POP      Full           Yes      Yes                                 +---------+---------------+---------+-----------+----------+--------------+ PTV      Full                                                        +---------+---------------+---------+-----------+----------+--------------+ PERO                                                  Not visualized +---------+---------------+---------+-----------+----------+--------------+  Left Venous Findings: +---------+---------------+---------+-----------+----------+-------+          CompressibilityPhasicitySpontaneityPropertiesSummary +---------+---------------+---------+-----------+----------+-------+ CFV      Full  Yes      Yes                          +---------+---------------+---------+-----------+----------+-------+ SFJ      Full                                                 +---------+---------------+---------+-----------+----------+-------+ FV Prox  Full                                                 +---------+---------------+---------+-----------+----------+-------+ FV Mid   Full                                                 +---------+---------------+---------+-----------+----------+-------+ FV DistalFull                                                 +---------+---------------+---------+-----------+----------+-------+ PFV      Full                                                 +---------+---------------+---------+-----------+----------+-------+ POP      Full            Yes      Yes                          +---------+---------------+---------+-----------+----------+-------+ PTV      Full                                                 +---------+---------------+---------+-----------+----------+-------+ PERO     Full                                                 +---------+---------------+---------+-----------+----------+-------+    Summary: Right: There is no evidence of deep vein thrombosis in the lower extremity. However, portions of this examination were limited- see technologist comments above. No cystic structure found in the popliteal fossa. Left: There is no evidence of deep vein thrombosis in the lower extremity. However, portions of this examination were limited- see technologist comments above. No cystic structure found in the popliteal fossa.  *See table(s) above for measurements and observations. Electronically signed by Deitra Mayo MD on 03/01/2018 at 2:39:35 PM.    Final    Vas [place] Upper Extremity Venous Duplex  Result Date: 03/01/2018 UPPER VENOUS STUDY  Indications: pulmonary embolism Limitations: Body habitus and Immobility, tightening bilateral arms, unable to move due to pain. Comparison Study: Negative BLEV study on 02/28/2018. Performing Technologist: Landry Mellow,  SELINA, H  Examination Guidelines: A complete evaluation includes B-mode imaging, spectral Doppler, color Doppler, and power Doppler as needed of all accessible portions of each vessel. Bilateral testing is considered an integral part of a complete examination. Limited examinations for reoccurring indications may be performed as noted.  Right Findings: +----------+------------+----------+---------+-----------+-------+ RIGHT     CompressiblePropertiesPhasicitySpontaneousSummary +----------+------------+----------+---------+-----------+-------+ IJV           Full                 Yes       Yes             +----------+------------+----------+---------+-----------+-------+ Subclavian    Full                 Yes       Yes            +----------+------------+----------+---------+-----------+-------+ Axillary      Full                 Yes       Yes            +----------+------------+----------+---------+-----------+-------+ Brachial      Full                 Yes       Yes            +----------+------------+----------+---------+-----------+-------+ Radial        Full                                          +----------+------------+----------+---------+-----------+-------+ Ulnar         Full                                          +----------+------------+----------+---------+-----------+-------+ Cephalic    Partial                                  Acute  +----------+------------+----------+---------+-----------+-------+ Basilic       Full                 Yes       Yes            +----------+------------+----------+---------+-----------+-------+ Right cephalic vein: partial thrombosed on the distal segment to forearm.  Left Findings: +----------+------------+----------+---------+-----------+-------+ LEFT      CompressiblePropertiesPhasicitySpontaneousSummary +----------+------------+----------+---------+-----------+-------+ IJV           Full                 Yes       Yes            +----------+------------+----------+---------+-----------+-------+ Subclavian    Full                 Yes       Yes            +----------+------------+----------+---------+-----------+-------+ Axillary      Full                 Yes       Yes            +----------+------------+----------+---------+-----------+-------+ Brachial      Full  Yes       Yes            +----------+------------+----------+---------+-----------+-------+ Radial        Full                                           +----------+------------+----------+---------+-----------+-------+ Ulnar         Full                                          +----------+------------+----------+---------+-----------+-------+ Cephalic      Full                                          +----------+------------+----------+---------+-----------+-------+ Basilic       Full                 Yes       Yes            +----------+------------+----------+---------+-----------+-------+ Dialysis access is patent.  Summary:  Right: No evidence of deep vein thrombosis in the upper extremity. No evidence of thrombosis in the subclavian. Findings consistent with acute superficial vein thrombosis involving the right cephalic vein.  Left: No evidence of deep vein thrombosis in the upper extremity. No evidence of superficial vein thrombosis in the upper extremity. No evidence of thrombosis in the subclavian.  *See table(s) above for measurements and observations.  Diagnosing physician: Deitra Mayo MD Electronically signed by Deitra Mayo MD on 03/01/2018 at 2:37:20 PM.    Final    Medications: . sodium chloride 10 mL/hr at 03/02/18 0800  . sodium chloride    .  ceFAZolin (ANCEF) IV Stopped (03/01/18 1842)  . heparin 1,550 Units/hr (03/02/18 1006)  . metronidazole Stopped (03/02/18 0732)  . norepinephrine (LEVOPHED) Adult infusion Stopped (03/01/18 0534)   . atorvastatin  80 mg Per Tube Daily  . calcitRIOL  0.25 mcg Oral Daily  . chlorhexidine gluconate (MEDLINE KIT)  15 mL Mouth Rinse BID  . Chlorhexidine Gluconate Cloth  6 each Topical Q0600  . docusate sodium  100 mg Oral BID  . ferric citrate  420 mg Oral TID WC  . insulin aspart  0-9 Units Subcutaneous TID AC & HS  . multivitamin  1 tablet Oral QHS  . mupirocin ointment  1 application Nasal BID  . [START ON 03/03/2018] pantoprazole  40 mg Oral Daily  . senna  1 tablet Oral BID     [facility]WF [facility]  4h  400/800  109kg  2/2 bath  LUE AVF  Hep 8000 + 2000  prn Calcitriol: 1.56mg PO qHD Last Labs:01/28/2018:Hgb 12.1, TSAT30.0, K4.7, Ca8.2, P6.3, PTH1028.0, Alb3.6.  Assessment/Plan: 1. Cardiac arrest: ROSC after 6 min. On empiric IV hep for poss PE. CT angio not done.  2. VDRF/Acute hypoxic and hypercarbic RF: extubated 03/01/18. Now on BiPap per primary.  3. Pulm infiltrates: may be vol vs infection, not clear on exam or imaging. Monitor.CXR today shows improvement in airspace disease.  Aspiration pneumonitis? Clearing today 4. Osteomyelitis R 2nd/3rd toes: WBC ~35. S/p 2nd/3rd ray amputation 2/19 - wound vac in place. On Vanc/Ceftriaxone.   5. ESRD: Next HD 03/04/18. K+ 3.8.   6. Hypertension/volume: BP  on soft side. Does not appear volume overloaded by exam. HD 03/01/18 Pre wt 107.1 Net UF 3.0 liters post wt 106.4 Doubt accuracy.  7. Anemiaof CKD: Hgb 11.1, monitor without ESA. 8. Secondary Hyperparathyroidism: Corr Ca ok, Phos ^ - continue Auryxia as binder and VDRA 9. Nutrition: NPO at present. Alb 2.3 - continue pro-stat supplements. 10   T2DM: Insulin per primary team   [place]. Brown NP-C 03/02/2018, 1:09 PM  [facility] [phone]  Pt seen, examined and agree w A/P as above.  [facility] 03/02/2018, 2:44 PM

## 2018-03-02 NOTE — Progress Notes (Signed)
PT Cancellation Note  Patient Details Name: Rachel Chaney MRN: [id number] DOB: 27-Sep-1960   Cancelled Treatment:    Reason Eval/Treat Not Completed: Medical issues which prohibited therapy. Pt with lethargy and confusion this AM. Pt now on bipap. RN advised to hold PT Rx. PT eval was completed yesterday. PT to further assess mobility at next scheduled session, if med status has improved.   Lorriane Shire 03/02/2018, 11:07 AM  Lorrin Goodell, PT  Office # [phone] Pager [phone]

## 2018-03-02 NOTE — Progress Notes (Signed)
ANTICOAGULATION CONSULT NOTE - Follow Up Consult  Pharmacy Consult for IV Heparin Indication: DVT  No Known Allergies  Patient Measurements: Height: 5\' 4"  (162.6 cm) Weight: 234 lb 9.1 oz (106.4 kg) IBW/kg (Calculated) : 54.7 Heparin Dosing Weight: 80 kg  Vital Signs: Temp: 98.2 F (36.8 C) (02/22 0804) Temp Source: Oral (02/22 0804) BP: 108/72 (02/22 0700) Pulse Rate: 89 (02/22 0800)  Labs: Recent Labs    02/28/18 0524  02/28/18 1720 02/28/18 2232 03/01/18 0659 03/01/18 0702 03/01/18 1515 03/01/18 1850 03/02/18 0517  HGB 11.2*  --  12.6  --  10.3*  --   --   --  11.1*  HCT 38.0  --  37.0  --  33.8*  --   --   --  38.8  PLT 275  --   --   --  290  --   --   --  325  LABPROT  --   --   --   --  15.9*  --   --   --  16.1*  INR  --   --   --   --  1.29  --   --   --  1.30  HEPARINUNFRC  --   --   --   --  0.29*  --   --  0.31 0.16*  CREATININE 7.97*   < >  --  5.28* 5.71*  --   --   --  4.64*  TROPONINI  --   --   --   --   --  0.95* 0.75* 0.63*  --    < > = values in this interval not displayed.    Estimated Creatinine Clearance: 15.5 mL/min (A) (by C-G formula based on SCr of 4.64 mg/dL (H)).  Assessment: 58 year old female on IV heparin for R-upper extremity DVT.    Heparin level this morning is SUBtherapeutic at 0.16. Per RN, no problems with the line or infusion and no bleeding noted.  Goal of Therapy:  Heparin level 0.3-0.7 units/ml Monitor platelets by anticoagulation protocol: Yes   Plan:  Increase Heparin 1550 units/hr. F/u 8 hour HL  Daily Heparin level and CBC while on therapy.    Thank you for allowing [place] to participate in this patients care.   Jens Som, PharmD Please utilize [employer] (under [place]) for appropriate number for your unit pharmacist. 03/02/2018 8:22 AM

## 2018-03-02 NOTE — Progress Notes (Addendum)
NAME:  Rachel Chaney, MRN:  [id number], DOB:  Jan 25, 1960, LOS: 5 ADMISSION DATE:  02/25/2018, CONSULTATION DATE:  02/28/18 REFERRING MD: Josephine Igo, MD CHIEF COMPLAINT:  S/p cardiac arrest  Brief History   58 year old female with ESRD who presented 2/17 for foot injury and admitted for amputation of 2nd and 3rd digit of right foot on 2/19. No post-op complications. Found unresponsive during dialysis on 2/20, CPR performed with ROSC. Transferred to ICU   History of present illness   58 year old female who admitted for crush injury of foot. 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 Wound culture grew S. Aureus with sensitivities pending. During dialysis, patient was found unresponsive. CPR was performed for 6 minutes with ROSC. She received epi x1 and sodium bicarb x 1. She was intubated and transferred to ICU.  Past Medical History  ESRD on HD, HTN, DM2, GERD  Significant Hospital Events   2/17 > admit 2/19 > Toe amputation on right foot 2/20 > Cardiac arrest, intubation, transferred to ICU 2/20 > started on heparin gtt empirically for presumed PE (IV team noted thrombus in RUE cephalic vein) 4/70: Extubated 2/22: Nursing staff reporting more lethargic, had been transferred to medicine service, new hypercarbic respiratory failure.  Placed on BiPAP Consults:  PCCM Nephro  Procedures:  ETT 2/20 > R fem CVL 2/20 >  R fem art line 2/20 > 2/20 (malfunctioned overnight) R radial art line 2/21 >   Significant Diagnostic Tests:  Right Foot XR 2/17 > displaced fracture of 1st and 2nd proximal phalanx  ABI 2/19 > right and left ABI indicates noncompressible RLE, unable to obtain TBI due to movement. Echo 2/20 > left ventricle hyperdynamic, EF greater than 96% diastolic parameters consistent with diastolic dysfunction.  Right ventricular pressures are elevated consistent with volume overload as well as demonstrating diminished right ventricular systolic  pressure right atrium was normal size LE duplex 2/20 > no DVT. UE duplex 2/21 > no DVTs.  Did have acute superficial vein thrombus in the right cephalic vein  Micro Data:  Wound culture 2/19 > S. Aureus. Susceptibilities pending Blood 2/21 >   Antimicrobials:  Vanc Ceftriaxone  Interim history/subjective:  Work of breathing worse.  A little more encephalopathic  Objective   Blood pressure 108/72, pulse 89, temperature 98.2 F (36.8 C), temperature source Oral, resp. rate 20, height 5\' 4"  (1.626 m), weight 106.4 kg, last menstrual period 09/18/2011, SpO2 99 %.        Intake/Output Summary (Last 24 hours) at 03/02/2018 0857 Last data filed at 03/02/2018 0800 Gross per 24 hour  Intake 861.83 ml  Output 2206 ml  Net -1344.17 ml   Filed Weights   02/28/18 1117 03/01/18 1500 03/01/18 1845  Weight: 109.8 kg 107.1 kg 106.4 kg   Physical Exam: General: This is an obese 58 year old female she appears chronically ill, currently demonstrates increased work of breathing and remains encephalopathic HEENT: Neck is large, mucous membranes moist pupils reactive Pulmonary: Prolonged expiratory phase, diminished throughout mild accessory use Cardiac: Regular rate and rhythm Abdomen: Soft, distended GU: Anuric Neuro: Awake, oriented x2 moves all extremities Extremities: Right lower extremity wrapped CMS intact  Resolved issues  Cardiac arrest, etiology unclear.  Question PE versus syncopal episode versus arrhythmia Hypotension secondary to sepsis and volume depletion Assessment & Plan:   Right toe fractures with soft tissue infection and osteomyelitis- Cx + S.Aureus. Plan Antibiotics narrowed to cefazolin and flagyl, receiving 2 g  IV post dialysis  S/p cardiac arrest - Unclear etiology. Possible  Sepsis arrhythmia, PE. Family report episode of near syncope post-procedure. Thrombus in RUE cephalic vein noted by IV team 2/20; therefore, started on empiric heparin gtt for presumed PE.  LE  duplex negative. Plan Cont tele   possible pulmonary emboli Plan 3 months of anticoagulation, currently on IV heparin  Acute hypoxic and hypercarbic respiratory failure -Suspect multifactorial but likely underlying OSA and OHS with new and superimposed atelectasis and deconditioning contributing Plan Placed on BiPAP Continue pulse oximetry N.p.o. for now Chest x-ray  Acute metabolic encephalopathy: Favor hypercarbia Plan Supportive care Hold sedation  End-stage renal disease, with intermittent fluid and electrolyte imbalance Plan Nephrology following  Type 2 diabetes Plan Sliding scale insulin  Anemia of chronic disease Plan Trend CBC  Best practice:  Diet: NPO Pain/Anxiety/Delirium protocol (if indicated): Fentanyl gtt and PRN versed VAP protocol (if indicated): Yes DVT prophylaxis: Heparin gtt / SCD's GI prophylaxis: PPI Glucose control: SSI Mobility: BR Code Status: Full Family Communication: Husband Disposition: ICU: Remains critically ill.  She is more encephalopathic with increased work of breathing.  This may simply be hypercarbia as a consequence of her critical illness, and likely what is untreated OHS OSA.  We will try BiPAP, check chest x-ray, would keep in ICU for closer monitoring   Erick Colace ACNP-BC [place] Pager # [phone] OR # [phone] if no answer  Attending Note:  58 year old female with ESRD who presents to PCCM who is post arrest transferred back to the ICU now with osteo and concern for sepsis.  Patient was improved and was to be transferred out of the ICU but PCCM called back for AMS and was noted to be altered but protecting her airway with coarse BS on exam.  I reviewed CXR myself, mild pulmonary edema noted.  Discussed with PCCM-NP.  Will place on BiPAP.  Maintain in the ICU.  If deteriorates then will intubate.  Will need sleep study as outpatient.  Will need BiPAP while asleep.  PCCM will assume care.  The  patient is critically ill with multiple organ systems failure and requires high complexity decision making for assessment and support, frequent evaluation and titration of therapies, application of advanced monitoring technologies and extensive interpretation of multiple databases.   Critical Care Time devoted to patient care services described in this note is  32  Minutes. This time reflects time of care of this signee Dr Jennet Maduro. This critical care time does not reflect procedure time, or teaching time or supervisory time of PA/NP/Med student/Med Resident etc but could involve care discussion time.  Rush Farmer, M.D. [facility] Pulmonary/Critical Care Medicine. Pager: [phone]. After hours pager: [phone].

## 2018-03-03 ENCOUNTER — Inpatient Hospital Stay (HOSPITAL_COMMUNITY): Payer: Medicare Other

## 2018-03-03 LAB — BASIC METABOLIC PANEL
Anion gap: 21 — ABNORMAL HIGH (ref 5–15)
BUN: 39 mg/dL — ABNORMAL HIGH (ref 6–20)
CO2: 20 mmol/L — ABNORMAL LOW (ref 22–32)
Calcium: 8.5 mg/dL — ABNORMAL LOW (ref 8.9–10.3)
Chloride: 95 mmol/L — ABNORMAL LOW (ref 98–111)
Creatinine, Ser: 6.85 mg/dL — ABNORMAL HIGH (ref 0.44–1.00)
GFR calc Af Amer: 7 mL/min — ABNORMAL LOW (ref >60)
GFR calc non Af Amer: 6 mL/min — ABNORMAL LOW (ref >60)
Glucose, Bld: 219 mg/dL — ABNORMAL HIGH (ref 70–99)
Potassium: 4.2 mmol/L (ref 3.5–5.1)
Sodium: 136 mmol/L (ref 135–145)

## 2018-03-03 LAB — GLUCOSE, CAPILLARY
Glucose-Capillary: 196 mg/dL — ABNORMAL HIGH (ref 70–99)
Glucose-Capillary: 234 mg/dL — ABNORMAL HIGH (ref 70–99)
Glucose-Capillary: 196 mg/dL — ABNORMAL HIGH (ref 70–99)
Glucose-Capillary: 139 mg/dL — ABNORMAL HIGH (ref 70–99)
Glucose-Capillary: 164 mg/dL — ABNORMAL HIGH (ref 70–99)

## 2018-03-03 LAB — CBC
HCT: 35.7 % — ABNORMAL LOW (ref 36.0–46.0)
Hemoglobin: 10.3 g/dL — ABNORMAL LOW (ref 12.0–15.0)
MCH: 31.4 pg (ref 26.0–34.0)
MCHC: 28.9 g/dL — ABNORMAL LOW (ref 30.0–36.0)
MCV: 108.8 fL — ABNORMAL HIGH (ref 80.0–100.0)
Platelets: 315 10*3/uL (ref 150–400)
RBC: 3.28 MIL/uL — ABNORMAL LOW (ref 3.87–5.11)
RDW: 15.2 % (ref 11.5–15.5)
WBC: 34.9 10*3/uL — ABNORMAL HIGH (ref 4.0–10.5)
nRBC: 0.3 % — ABNORMAL HIGH (ref 0.0–0.2)

## 2018-03-03 LAB — HEPARIN LEVEL (UNFRACTIONATED): Heparin Unfractionated: 0.43 IU/mL (ref 0.30–0.70)

## 2018-03-03 LAB — PROTIME-INR
INR: 1.23
Prothrombin Time: 15.3 seconds — ABNORMAL HIGH (ref 11.4–15.2)

## 2018-03-03 LAB — PHOSPHORUS: Phosphorus: 7.8 mg/dL — ABNORMAL HIGH (ref 2.5–4.6)

## 2018-03-03 LAB — MAGNESIUM: Magnesium: 2.1 mg/dL (ref 1.7–2.4)

## 2018-03-03 MED ORDER — HEPARIN SODIUM (PORCINE) 1000 UNIT/ML IJ SOLN
INTRAMUSCULAR | Status: AC
  Administered 2018-03-03: 4500 [IU] via INTRAVENOUS_CENTRAL
  Filled 2018-03-03: qty 5

## 2018-03-03 MED ORDER — HEPARIN SODIUM (PORCINE) 1000 UNIT/ML DIALYSIS
4500.0000 [IU] | INTRAMUSCULAR | Status: DC | PRN
  Administered 2018-03-03: 4500 [IU] via INTRAVENOUS_CENTRAL
  Filled 2018-03-03: qty 4.5

## 2018-03-03 MED ORDER — CHLORHEXIDINE GLUCONATE CLOTH 2 % EX PADS: 6.0000 | MEDICATED_PAD | Freq: Every day | CUTANEOUS | Status: DC

## 2018-03-03 MED ORDER — DIPHENOXYLATE-ATROPINE 2.5-0.025 MG PO TABS
2.0000 | ORAL_TABLET | Freq: Four times a day (QID) | ORAL | Status: DC | PRN
  Administered 2018-03-05: 2 via ORAL
  Filled 2018-03-03: qty 2

## 2018-03-03 MED ORDER — IBUPROFEN 200 MG PO TABS
400.0000 mg | ORAL_TABLET | Freq: Four times a day (QID) | ORAL | Status: DC | PRN
  Administered 2018-03-03: 400 mg via ORAL
  Filled 2018-03-03: qty 2

## 2018-03-03 NOTE — Progress Notes (Signed)
eLink Physician-Brief Progress Note Patient Name: Rachel Chaney DOB: 01/15/1960 MRN: [id number]   Date of Service  03/03/2018  HPI/Events of Note  Patient c/o pain related to CPR. PMH of ESRD on HD.  Patient also requests sleep aid.   eICU Interventions  Will order: 1. Motrin 400 mg PO Q 6 hours PRN pain. 2. Melatonin 4 mg PO Q HS PRN sleep.      Intervention Category Intermediate Interventions: Pain - evaluation and management  Theodosia Bahena Eugene 03/03/2018, 10:20 PM

## 2018-03-03 NOTE — Progress Notes (Addendum)
NAME:  Rachel Chaney, MRN:  [id number], DOB:  1960/07/14, LOS: 6 ADMISSION DATE:  02/25/2018, CONSULTATION DATE:  02/28/18 REFERRING MD: Josephine Igo, MD CHIEF COMPLAINT:  S/p cardiac arrest  Brief History   58 year old female with ESRD who presented 2/17 for foot injury and admitted for amputation of 2nd and 3rd digit of right foot on 2/19. No post-op complications. Found unresponsive during dialysis on 2/20, CPR performed with ROSC. Transferred to ICU   History of present illness   58 year old female who admitted for crush injury of foot. 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 Wound culture grew S. Aureus with sensitivities pending. During dialysis, patient was found unresponsive. CPR was performed for 6 minutes with ROSC. She received epi x1 and sodium bicarb x 1. She was intubated and transferred to ICU.  Past Medical History  ESRD on HD, HTN, DM2, GERD  Significant Hospital Events   2/17 > admit 2/19 > Toe amputation on right foot 2/20 > Cardiac arrest, intubation, transferred to ICU 2/20 > started on heparin gtt empirically for presumed PE (IV team noted thrombus in RUE cephalic vein) 8/85: Extubated 2/22: Nursing staff reporting more lethargic, had been transferred to medicine service, new hypercarbic respiratory failure.  Placed on BiPAP 2/23: Remains off blood pressure medications for several days now in spite of what it says on MAR.  Hemodynamically stable.  Much more awake.  Suspect episode on 2/23 primarily due to hypercarbia. Consults:  PCCM Nephro  Procedures:  ETT 2/20 > R fem CVL 2/20 >  R fem art line 2/20 > 2/20 (malfunctioned overnight) R radial art line 2/21 > 2/23  Significant Diagnostic Tests:  Right Foot XR 2/17 > displaced fracture of 1st and 2nd proximal phalanx  ABI 2/19 > right and left ABI indicates noncompressible RLE, unable to obtain TBI due to movement. Echo 2/20 > left ventricle hyperdynamic, EF greater than 02%  diastolic parameters consistent with diastolic dysfunction.  Right ventricular pressures are elevated consistent with volume overload as well as demonstrating diminished right ventricular systolic pressure right atrium was normal size LE duplex 2/20 > no DVT. UE duplex 2/21 > no DVTs.  Did have acute superficial vein thrombus in the right cephalic vein  Micro Data:  Wound culture 2/19 > S. Aureus. Susceptibilities pending Blood 2/21 >   Antimicrobials:  Vanc discontinued Ceftriaxone discontinued, Ancef 2/21 Interim history/subjective:  Work of breathing worse.  A little more encephalopathic  Objective   Blood pressure (Abnormal) 102/51, pulse 82, temperature 99.1 F (37.3 C), temperature source Axillary, resp. rate (Abnormal) 22, height 5\' 4"  (1.626 m), weight 106.4 kg, last menstrual period 09/18/2011, SpO2 92 %.    FiO2 (%):  [40 %] 40 %   Intake/Output Summary (Last 24 hours) at 03/03/2018 1002 Last data filed at 03/03/2018 0800 Gross per 24 hour  Intake 879.2 ml  Output no documentation  Net 879.2 ml   Filed Weights   02/28/18 1117 03/01/18 1500 03/01/18 1845  Weight: 109.8 kg 107.1 kg 106.4 kg   Physical Exam: General: Appears much more comfortable today.  No acute distress.  Moving around in bed, following commands HEENT: Normocephalic atraumatic currently has BiPAP mask in place but trying to verbalize Pulmonary: Diminished throughout no accessory use Cardiac: Regular rate and rhythm Abdomen: Soft nontender is having liquid stools GU: Anuric Neuro: Awake oriented following commands Extremities: Warm and dry, right foot dressing intact Resolved issues  Cardiac arrest,  etiology unclear.  Question PE versus syncopal episode versus arrhythmia Hypotension secondary to sepsis and volume depletion Assessment & Plan:   Right toe fractures with soft tissue infection and osteomyelitis- Cx + S.Aureus. Plan Continue cefazolin and Flagyl; day #3, probably discontinue Flagyl on  2/24 if all cultures remain negative Needs PT consult  S/p cardiac arrest - Unclear etiology. Possible  Sepsis arrhythmia, PE. Family report episode of near syncope post-procedure. Thrombus in RUE cephalic vein noted by IV team 2/20; therefore, started on empiric heparin gtt for presumed PE.  LE duplex negative. Plan  telemetry monitoring  possible pulmonary emboli, ultrasound is negative with exception of superficial thrombus in upper extremity Plan We will treat empirically with 3 months of anticoagulation  Acute hypoxic and hypercarbic respiratory failure -Suspect multifactorial but likely underlying OSA and OHS with new and superimposed atelectasis and volume excess -I suspect she is much more chronically hypoxic than we initially thought given her body habitus Plan Volume removal with dialysis Weaning oxygen BiPAP at rest and at bedtime Mobilization Would benefit from sleep study outpatient setting  Acute metabolic encephalopathy: Favor hypercarbia Plan Cont supportive care   End-stage renal disease, with intermittent fluid and electrolyte imbalance Plan Nephrology following   Type 2 diabetes Plan ssi   Anemia of chronic disease Plan Trend CBC  Diarrhea Plan Lomotil  Best practice:  Diet: NPO Pain/Anxiety/Delirium protocol (if indicated): Fentanyl gtt and PRN versed these been discontinued VAP protocol (if indicated): Yes DVT prophylaxis: Heparin gtt / SCD's GI prophylaxis: PPI Glucose control: SSI Mobility: BR Code Status: Full Family Communication: Husband Disposition: ICU: Much improved.  Changing BiPAP to at bedtime and as needed, may need high flow because still desaturates off oxygen, advance diet.  I wonder if we need to really assess her dry weight.  We will ask physical therapy to evaluate her for nonweightbearing status need to get her out of bed  Erick Colace ACNP-BC [place] Pager # [phone] OR # [phone] if no  answer  Attending Note:  58 year old female with ESRD who presents to PCCM post arrest.  On exam, she is alert and interactive with clear lungs.  I reviewed CXR myself, mild pulmonary edema noted.  Discussed with PCCM-NP.  Labs reviewed.  Acute respiratory failure:  - Monitor closely for airway protection  Hypoxemia:  - Titrate O2 for sat of 88-92%.  Circulatory shock:  - D/C levophed from [facility]  Cardiac arrest:  - Full code  - Tele monitoring  Sepsis with S aureus  - Cefazolin as ordered  - D/C Vanc  Transfer out of the ICU and care to [place] with PCCM off 3/24  Patient seen and examined, agree with above note.  I dictated the care and orders written for this patient under my direction.  Rush Farmer, [place]

## 2018-03-03 NOTE — Progress Notes (Signed)
Patient off bipap at this time and on HFNC at 10L. Patient is tolerating well at this time.

## 2018-03-03 NOTE — Progress Notes (Signed)
[facility] Progress Note   Subjective: awake and on bipap. CXR shows persistent diffuse iniltrates/ edema  Objective Vitals:   03/03/18 1000 03/03/18 1115 03/03/18 1158 03/03/18 1200  BP: (!) 138/59 113/60 131/73 133/70  Pulse: 87 86  86  Resp: '19 20  20  ' Temp:   98.5 F (36.9 C)   TempSrc:   Oral   SpO2: 96% 93%  95%  Weight:      Height:       Physical Exam General: Obese, chronically ill appearing female in NAD Heart: S1,S2, HS distant. SR on monitor.  Lungs: Bilateral breath sounds, decreased in bases, CTAB at present. Abdomen: S, NT Extremities: No LE edema. Wound vac in place R foot.  Dialysis Access: L AVF + bruit  Additional Objective Labs: Basic Metabolic Panel: Recent Labs  Lab 03/01/18 0659 03/02/18 0517 03/02/18 0913 03/03/18 0501  NA 138 138 136 136  K 4.0 3.8 3.8 4.2  CL 98 94*  --  95*  CO2 28 24  --  20*  GLUCOSE 141* 145*  --  219*  BUN 22* 15  --  39*  CREATININE 5.71* 4.64*  --  6.85*  CALCIUM 8.4* 8.5*  --  8.5*  PHOS 5.9* 6.3*  --  7.8*   Liver Function Tests: Recent Labs  Lab 02/25/18 1105 02/28/18 0722 02/28/18 2232  AST 15  --  145*  ALT 18  --  96*  ALKPHOS 85  --  173*  BILITOT 0.6  --  1.4*  PROT 6.9  --  6.6  ALBUMIN 2.6* 2.3* 2.1*   No results for input(s): LIPASE, AMYLASE in the last 168 hours. CBC: Recent Labs  Lab 02/25/18 1105  02/27/18 0801 02/28/18 0524  03/01/18 0659 03/02/18 0517 03/02/18 0913 03/03/18 0501  WBC 27.0*   < > 32.1* 30.7*  --  35.1* 37.4*  --  34.9*  NEUTROABS 22.7*  --  28.9* 26.4*  --   --   --   --   --   HGB 11.1*   < > 11.9* 11.2*   < > 10.3* 11.1* 11.6* 10.3*  HCT 36.9   < > 39.2 38.0   < > 33.8* 38.8 34.0* 35.7*  MCV 103.1*   < > 105.1* 105.8*  --  104.3* 110.2*  --  108.8*  PLT 247   < > 299 275  --  290 325  --  315   < > = values in this interval not displayed.   Blood Culture    Component Value Date/Time   SDES BLOOD A-LINE 03/01/2018 0659   SPECREQUEST   03/01/2018 0659    BOTTLES DRAWN AEROBIC AND ANAEROBIC Blood Culture adequate volume   CULT NO GROWTH 2 DAYS 03/01/2018 0659   REPTSTATUS PENDING 03/01/2018 0659    Cardiac Enzymes: Recent Labs  Lab 03/01/18 0702 03/01/18 1515 03/01/18 1850  TROPONINI 0.95* 0.75* 0.63*   CBG: Recent Labs  Lab 03/02/18 2115 03/02/18 2337 03/03/18 0412 03/03/18 0803 03/03/18 1157  GLUCAP 156* 166* 196* 234* 196*   Iron Studies: No results for input(s): IRON, TIBC, TRANSFERRIN, FERRITIN in the last 72 hours. '@[email or web address]' @ Studies/Results: Dg Chest Port 1 View  Result Date: 03/03/2018 CLINICAL DATA:  Acute respiratory failure. History CHF. Diabetes. Hypertension. EXAM: PORTABLE CHEST 1 VIEW COMPARISON:  1 day prior FINDINGS: Midline trachea. Cardiomegaly accentuated by AP portable technique. No pleural effusion or pneumothorax. Persistently low lung volumes. No change in interstitial and basilar predominant airspace  disease. IMPRESSION: 1. No significant change since the prior exam. 2. Cardiomegaly with low lung volumes and interstitial and airspace disease. Pulmonary edema and/or infection. Electronically Signed   By: Abigail Miyamoto M.D.   On: 03/03/2018 08:23   Dg Chest Port 1 View  Result Date: 03/02/2018 CLINICAL DATA:  Follow-up infiltrates EXAM: PORTABLE CHEST 1 VIEW COMPARISON:  03/01/2018 FINDINGS: Endotracheal tube and NG tubes removed Bilateral airspace disease with mild interval improvement. No effusion. IMPRESSION: Improvement in bilateral airspace disease following extubation. Electronically Signed   By: Franchot Gallo M.D.   On: 03/02/2018 10:36   Medications: . sodium chloride 10 mL/hr at 03/03/18 1200  . sodium chloride    .  ceFAZolin (ANCEF) IV Stopped (03/01/18 1842)  . heparin 1,550 Units/hr (03/03/18 1200)  . metronidazole 500 mg (03/03/18 1312)   . atorvastatin  80 mg Per Tube Daily  . calcitRIOL  0.25 mcg Oral Daily  . chlorhexidine gluconate (MEDLINE KIT)  15 mL Mouth  Rinse BID  . Chlorhexidine Gluconate Cloth  6 each Topical Q0600  . docusate sodium  100 mg Oral BID  . ferric citrate  420 mg Oral TID WC  . insulin aspart  0-9 Units Subcutaneous TID AC & HS  . multivitamin  1 tablet Oral QHS  . mupirocin ointment  1 application Nasal BID  . pantoprazole  40 mg Oral Daily     [facility]WF [facility]  4h  400/800  109kg  2/2 bath  LUE AVF  Hep 8000 + 2000 prn Calcitriol: 1.38mg PO qHD Last Labs:01/28/2018:Hgb 12.1, TSAT30.0, K4.7, Ca8.2, P6.3, PTH1028.0, Alb3.6.  Assessment/Plan: 1. Cardiac arrest: ROSC after 6 min. On empiric IV hep for poss PE. CT angio not done.  2. VDRF/Acute hypoxic and hypercarbic RF: extubated 03/01/18 3. Pulm infiltrates: on bipap, CXR no better. Have d/w CCM, they think prob volume related. Plan HD today, get vol down as tolerated.  4. Osteomyelitis R 2nd/3rd toes: admit diagnosis, underwent 2nd/3rd ray amputation 2/19 - wound vac in place. On Vanc/Ceftriaxone.   5. ESRD: extra HD today. Usual HD MWF.   6. Hypertension/volume: BP's OK, under dry wt.  7. Anemiaof CKD: Hgb 11.1, monitor without ESA. 8. Secondary Hyperparathyroidism: Corr Ca ok, Phos ^ - continue Auryxia as binder and VDRA 9. Nutrition: NPO at present. Alb 2.3 - continue pro-stat supplements. 10   T2DM: Insulin per primary team   [facility] 03/03/2018, 1:15 PM

## 2018-03-03 NOTE — Progress Notes (Signed)
ANTICOAGULATION CONSULT NOTE - Follow Up Consult  Pharmacy Consult for IV Heparin Indication: DVT  No Known Allergies  Patient Measurements: Height: 5\' 4"  (162.6 cm) Weight: 234 lb 9.1 oz (106.4 kg) IBW/kg (Calculated) : 54.7 Heparin Dosing Weight: 80 kg  Vital Signs: Temp: 99.1 F (37.3 C) (02/23 0800) Temp Source: Axillary (02/23 0800) BP: 102/51 (02/23 0811) Pulse Rate: 82 (02/23 0811)  Labs: Recent Labs    03/01/18 0659 03/01/18 0702 03/01/18 1515 03/01/18 1850 03/02/18 0517 03/02/18 0913 03/02/18 1820 03/03/18 0501  HGB 10.3*  --   --   --  11.1* 11.6*  --  10.3*  HCT 33.8*  --   --   --  38.8 34.0*  --  35.7*  PLT 290  --   --   --  325  --   --  315  LABPROT 15.9*  --   --   --  16.1*  --   --  15.3*  INR 1.29  --   --   --  1.30  --   --  1.23  HEPARINUNFRC 0.29*  --   --  0.31 0.16*  --  0.56 0.43  CREATININE 5.71*  --   --   --  4.64*  --   --  6.85*  TROPONINI  --  0.95* 0.75* 0.63*  --   --   --   --     Estimated Creatinine Clearance: 10.5 mL/min (A) (by C-G formula based on SCr of 6.85 mg/dL (H)).  Assessment: 48 yoF with new DVT and ?PE, continues on IV heparin. Heparin level remains within goal range at 0.43 this AM. Hgb down 10.3, pltc stable WNL. No bleeding noted.  Goal of Therapy:  Heparin level 0.3-0.7 units/ml Monitor platelets by anticoagulation protocol: Yes   Plan:  Continue heparin gtt at 1550 units/hr Daily heparin level and CBC Monitor for s/sx of bleeding F/u CTA results  Thank you for allowing pharmacy to participate in this patient's care.   Mila Merry Gerarda Fraction, PharmD, [place] PGY2 Infectious Diseases Pharmacy Resident Phone: [phone] 03/03/2018 9:52 AM

## 2018-03-04 LAB — POCT I-STAT 7, (LYTES, BLD GAS, ICA,H+H)
Acid-base deficit: 3 mmol/L — ABNORMAL HIGH (ref 0.0–2.0)
Bicarbonate: 21.9 mmol/L (ref 20.0–28.0)
Calcium, Ion: 1.16 mmol/L (ref 1.15–1.40)
HCT: 38 % (ref 36.0–46.0)
Hemoglobin: 12.9 g/dL (ref 12.0–15.0)
O2 Saturation: 96 %
Patient temperature: 98.5
Potassium: 3.1 mmol/L — ABNORMAL LOW (ref 3.5–5.1)
Sodium: 130 mmol/L — ABNORMAL LOW (ref 135–145)
TCO2: 23 mmol/L (ref 22–32)
pCO2 arterial: 36.3 mmHg (ref 32.0–48.0)
pH, Arterial: 7.387 (ref 7.350–7.450)
pO2, Arterial: 79 mmHg — ABNORMAL LOW (ref 83.0–108.0)

## 2018-03-04 LAB — GLUCOSE, CAPILLARY
Glucose-Capillary: 221 mg/dL — ABNORMAL HIGH (ref 70–99)
Glucose-Capillary: 278 mg/dL — ABNORMAL HIGH (ref 70–99)
Glucose-Capillary: 181 mg/dL — ABNORMAL HIGH (ref 70–99)
Glucose-Capillary: 243 mg/dL — ABNORMAL HIGH (ref 70–99)

## 2018-03-04 LAB — CBC
HCT: 35.8 % — ABNORMAL LOW (ref 36.0–46.0)
Hemoglobin: 10.2 g/dL — ABNORMAL LOW (ref 12.0–15.0)
MCH: 31.1 pg (ref 26.0–34.0)
MCHC: 28.5 g/dL — ABNORMAL LOW (ref 30.0–36.0)
MCV: 109.1 fL — ABNORMAL HIGH (ref 80.0–100.0)
Platelets: 317 10*3/uL (ref 150–400)
RBC: 3.28 MIL/uL — ABNORMAL LOW (ref 3.87–5.11)
RDW: 15.3 % (ref 11.5–15.5)
WBC: 32.2 10*3/uL — ABNORMAL HIGH (ref 4.0–10.5)
nRBC: 0.5 % — ABNORMAL HIGH (ref 0.0–0.2)

## 2018-03-04 LAB — AEROBIC/ANAEROBIC CULTURE W GRAM STAIN (SURGICAL/DEEP WOUND)

## 2018-03-04 LAB — BASIC METABOLIC PANEL
Anion gap: 17 — ABNORMAL HIGH (ref 5–15)
BUN: 27 mg/dL — ABNORMAL HIGH (ref 6–20)
CO2: 23 mmol/L (ref 22–32)
Calcium: 8.3 mg/dL — ABNORMAL LOW (ref 8.9–10.3)
Chloride: 95 mmol/L — ABNORMAL LOW (ref 98–111)
Creatinine, Ser: 4.84 mg/dL — ABNORMAL HIGH (ref 0.44–1.00)
GFR calc Af Amer: 11 mL/min — ABNORMAL LOW (ref >60)
GFR calc non Af Amer: 9 mL/min — ABNORMAL LOW (ref >60)
Glucose, Bld: 214 mg/dL — ABNORMAL HIGH (ref 70–99)
Potassium: 3.7 mmol/L (ref 3.5–5.1)
Sodium: 135 mmol/L (ref 135–145)

## 2018-03-04 LAB — PROTIME-INR
INR: 1.27
Prothrombin Time: 15.8 seconds — ABNORMAL HIGH (ref 11.4–15.2)

## 2018-03-04 LAB — PHOSPHORUS: Phosphorus: 5.7 mg/dL — ABNORMAL HIGH (ref 2.5–4.6)

## 2018-03-04 LAB — HEPARIN LEVEL (UNFRACTIONATED)
Heparin Unfractionated: 0.8 IU/mL — ABNORMAL HIGH (ref 0.30–0.70)
Heparin Unfractionated: 0.43 IU/mL (ref 0.30–0.70)

## 2018-03-04 LAB — AEROBIC/ANAEROBIC CULTURE (SURGICAL/DEEP WOUND)

## 2018-03-04 LAB — MAGNESIUM: Magnesium: 2.1 mg/dL (ref 1.7–2.4)

## 2018-03-04 MED ORDER — SODIUM CHLORIDE 0.9 % IV BOLUS: 500.0000 mL | Freq: Once | INTRAVENOUS | Status: DC

## 2018-03-04 MED ORDER — HEPARIN SODIUM (PORCINE) 1000 UNIT/ML DIALYSIS: 4500.0000 [IU] | INTRAMUSCULAR | Status: DC | PRN

## 2018-03-04 MED ORDER — CHLORHEXIDINE GLUCONATE CLOTH 2 % EX PADS
6.0000 | MEDICATED_PAD | Freq: Every day | CUTANEOUS | Status: DC
  Administered 2018-03-04 – 2018-03-05 (×2): 6 via TOPICAL

## 2018-03-04 MED ORDER — ALBUMIN HUMAN 5 % IV SOLN
12.5000 g | Freq: Once | INTRAVENOUS | Status: AC
  Administered 2018-03-04: 12.5 g via INTRAVENOUS
  Filled 2018-03-04: qty 250

## 2018-03-04 MED ORDER — DICLOFENAC SODIUM 1 % TD GEL
4.0000 g | Freq: Four times a day (QID) | TRANSDERMAL | Status: DC
  Administered 2018-03-04 – 2018-03-14 (×34): 4 g via TOPICAL
  Filled 2018-03-04: qty 100

## 2018-03-04 MED ORDER — INSULIN GLARGINE 100 UNIT/ML ~~LOC~~ SOLN
15.0000 [IU] | Freq: Every day | SUBCUTANEOUS | Status: DC
  Administered 2018-03-05 – 2018-03-07 (×3): 15 [IU] via SUBCUTANEOUS
  Filled 2018-03-04 (×4): qty 0.15

## 2018-03-04 MED ORDER — LANTHANUM CARBONATE 500 MG PO CHEW: 1000.0000 mg | CHEWABLE_TABLET | Freq: Three times a day (TID) | ORAL | Status: DC

## 2018-03-04 MED ORDER — HEPARIN SODIUM (PORCINE) 1000 UNIT/ML IJ SOLN
INTRAMUSCULAR | Status: AC
  Filled 2018-03-04: qty 4

## 2018-03-04 NOTE — Progress Notes (Addendum)
Family Medicine Teaching Service Daily Progress Note Intern Pager: [phone]  Patient name: Rachel Chaney Medical record number: [id number] Date of birth: 08-01-1960 Age: 58 y.o. Gender: female  Primary Care Provider: Bufford Lope, DO Consultants: Nephrology, Orthopedics Code Status: Full code  Pt Overview and Major Events to Date:  2/17 > admit 2/19 > Toe amputation on right foot 2/20 > Cardiac arrest, intubation, transferred to ICU 2/20 > started on heparin gtt empirically for presumed PE (IV team noted thrombus in RUE cephalic vein) 9/48: Extubated 2/22: Nursing staff reporting more lethargic, had been transferred to medicine service, new hypercarbic respiratory failure.  Placed on BiPAP 2/23: Remains off blood pressure medications for several days now in spite of what it says on MAR.  Hemodynamically stable.  Much more awake.  Suspect episode on 2/23 primarily due to hypercarbia.  Assessment and Plan: Rachel Chaney a 58 y.o.femalepresenting with painful 2nd toe of R foot. PMH is significant forESRD on HD MWF,T2DM,HTN, GERD, CHF, and dyslipidemia.  #Right second toe fractureOPEN, soft tissue infection, concerning for osteomyelitis: Initially admitted for soft tissue infection of R toe and was transferred to ICU on 2/20 after cardiac arrest of unknown etiology, now back in our care. Patient w/ R toe open fx, soft tissue infection and osteo In the setting of uncontrolled diabetes now s/p amputation of 2nd and 3rd right toe on 2/19. Was found to have large purulent abscess extending to the midfoot. Cultures obtained x2, positive for S. Aureus, pan-sensitive. Patient has remained afebrile with WBC 32.2. Antibiotic course as below. Sepsis from toe wound is possible etiology of cardiac arrest but BCx negative and patient has been on broad-spectrum antibiotics.  - S/p amputation 2nd and 3rd R toes 2/19 - S/p IV Ancef (2/17), IV CTX and IV Vanc (2/17 - 2/21), IV Ancef and  metronidazole (2/21 - ) - Continue IV Ancef - D/C metronidazole given no gram neg or anaerobe growth on culture - PT/OT - Would benefit from mobilization, but pt should be nonweightbearing on the operative foot  #S/p cardiac arrest 2/20 - unclear etiology. Possible PE vs. Sepsis, arrhythmia.  Thought most likely to be due to PE given thrombus in RUE cephalic vein noted by IV team 2/20. Was started on heparin gtt for presumed PE. Would not do CTA chest per nephro given ESRD. Considered V/Q scan but will hold off for now given an equivocal result would not change management. Sepsis from foot wound possible but patient has been on broad-spectrum antibiotics. Has been monitored on telemetry for possible arrhythmia. Extubated 2/21. Given lack of etiology for cardiac arrest, will continue to monitor closely and keep ICU status until more stable for transfer to step down. - Heparin infusion  - Telemetry - Tx empirically w/ 3 months of anticoag - Full code status  #Acute hypoxic and hypercarbic respiratory failure Thought to be multifactorial with likely underlying OSA and OHS with atelectasis and volume excess. Had extra dialysis session yesterday for volume removal and has been able to wean off Bipap and is satting well on room air today. - Wean O2, titrate for sat 88-92% - Bipap PRN - Would benefit from mobilization, but pt should be nonweightbearing on the operative foot - Sleep study as outpatient   #Acute metabolic encephalopathy Patient with confusion for the past few days. Per patient's husband, her short-term memory has been impaired and she has had word-finding difficulty. She is alert and oriented to person and time but not place and situation.  AMS could be due to hypercarbia from respiratory failure but patient with normal bicarb 21.9 this AM. Delirium in the hospital setting is likely contributing. No focal deficits that would be concerning for stroke. - Continue to monitor AMS -  Supportive care  #HTN:Currently normotensive. Home regimen includes hydralazine and carvedilol.  - Holding home BP meds given recent hypotension - Consider midodrine if hypotensive - Nephrology following, appreciate recs  #End-stage renal disease on HD Receives hemodialysis MWF at [facility]. Received extra dose of hemodyalisis yesterday 2/23 for volume removal. Due for next session today. - Dialysis per nephro - Nephro following, appreciate recs - Renal/carb diet   #T2DM:HbA1c 11.4% 02/26/18. 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 Home regimen is 50 units Levemir twice daily. Unclear if she is compliant with this at home given good response to insulin regimen here. FBG 221, was NPO. 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 Was receiving treatment as outpatient.  - Nephro following, appreciate recs - Continue home calcitriol and auryxia  #HFpEF: Last echo 04/12/2015 with Grade 1 diastolic dysfunction, EF 81-44%. Patient with 1+ peripheral edema on exam but no crackles. Does not take diuretics at home. - Holding home carvedilol as above  #Diarrhea Started on lomotil in ICU. Also on colace and ducolax, could consider DC'ing if diarrhea continues - Lomotil  #Hypoalbuminemia Albumin 2.6 on admission. No evidence of liver disease. Urinalysis from 2017 with >300 protein indicating that there is likely a nephrotic syndrome component to her hypoalbuminemia. Protein calorie malnutrition given poor diet could also be contributing. S/p albumin 12.5 g overnight by ICU. - Pro-Stat and renal MVI supplementation per Nutrition , appreciate recs - Nephro following, appreciate recs  #Anemia of CKD:  Hgb 10.8 on admission, stable (baseline in the 12's as outpatient). Per nephro, suspect some blood  loss with injury.  - Daily CBCs  #Hyperlipidemia: Most recent lipid panel April 2019 showing low cholesterol at 94, low HDL at 32, and other values within normal limits. - Continue atorvastatin 80 mg  #GERD: Patient asymptomatic at this time, usually takes Tums at home. -Monitor for acid reflux symptoms.  FEN/GI: Carb/renal diet PPx: heparin  Disposition: ICU  Subjective:  Ms. Mcnelly reports that her chest has been hurting since getting CPR. Pain is primarily located in the front starting mid-chest and down into abdomen. She was unable to sleep last night due to pain. Husband reports memory issues stating that patient will ask for medications that she was just given because she doesn't remember getting them. He also reported that she often seems to lose her words. She denies SOB, pain in feet, or chills.   Objective: Temp:  [97.8 F (36.6 C)-98.5 F (36.9 C)] 98.2 F (36.8 C) (02/24 0800) Pulse Rate:  [75-93] 86 (02/24 0754) Resp:  [16-28] 21 (02/24 0700) BP: (97-146)/(45-96) 118/55 (02/24 0754) SpO2:  [87 %-97 %] 92 % (02/24 0754) Arterial Line BP: (141-149)/(56-63) 141/63 (02/23 1200) FiO2 (%):  [60 %] 60 % (02/24 0515) Weight:  [103.1 kg-106.4 kg] 103.1 kg (02/23 1734) Physical Exam: General: ill-appearing, unable to find words at times Cardiovascular: RRR Respiratory: CTAB, decreased air movement, normal work of breathing, no wheezing or crackles Abdomen: Soft, nontender Extremities: Trace peripheral edema Neuro: A&O to person and time but not place or situation  Laboratory: Recent  Labs  Lab 03/02/18 0517  03/03/18 0501 03/04/18 0435 03/04/18 0438  WBC 37.4*  --  34.9*  --  32.2*  HGB 11.1*   < > 10.3* 12.9 10.2*  HCT 38.8   < > 35.7* 38.0 35.8*  PLT 325  --  315  --  317   < > = values in this interval not displayed.   Recent Labs  Lab 02/25/18 1105  02/28/18 2232  03/02/18 0517  03/03/18 0501 03/04/18 0435 03/04/18 0438  NA 135   < > 136   < > 138    < > 136 130* 135  K 3.7   < > 4.1   < > 3.8   < > 4.2 3.1* 3.7  CL 90*   < > 96*   < > 94*  --  95*  --  95*  CO2 30   < > 27   < > 24  --  20*  --  23  BUN 44*   < > 20   < > 15  --  39*  --  27*  CREATININE 8.98*   < > 5.28*   < > 4.64*  --  6.85*  --  4.84*  CALCIUM 8.6*   < > 8.1*   < > 8.5*  --  8.5*  --  8.3*  PROT 6.9  --  6.6  --   --   --   --   --   --   BILITOT 0.6  --  1.4*  --   --   --   --   --   --   ALKPHOS 85  --  173*  --   --   --   --   --   --   ALT 18  --  96*  --   --   --   --   --   --   AST 15  --  145*  --   --   --   --   --   --   GLUCOSE 69*   < > 217*   < > 145*  --  219*  --  214*   < > = values in this interval not displayed.    Imaging/Diagnostic Tests: Dg Foot Complete Right  Result Date: 02/25/2018 CLINICAL DATA:  Toe injury. EXAM: RIGHT FOOT COMPLETE - 3+ VIEW COMPARISON:  Radiographs of May 14, 2012. FINDINGS: Moderately displaced fracture is seen involving the proximal portion of the first proximal phalanx with intra-articular extension with some callus formation suggesting subacute fracture. Minimally displaced fracture is seen involving the proximal portion of the second distal phalanx. Soft tissue irregularity or wound is seen involving the distal portion of the second toe. Mild posterior calcaneal spurring is noted. IMPRESSION: Probable subacute moderately displaced fracture is seen involving the first proximal phalanx with intra-articular extension. Probable minimally displaced acute fracture is seen involving proximal portion of the second distal phalanx. Overlying soft tissue irregularity or wound is seen involving the distal soft tissues of the second toe. Electronically Signed   By: Marijo Conception, M.D.   On: 02/25/2018 12:17    I have seen and evaluated the patient with Medical Student Minish. I am in agreement with the note above in its revised form. My additions are in blue.  Marjie Skiff, MD Family Medicine, PGY-3  Mitchell Heir,  Medical Student 03/04/2018, 9:08 AM [facility] Intern pager: [phone], text  pages welcome

## 2018-03-04 NOTE — Progress Notes (Signed)
Subjective: Interval History: has complaints chest sore.  Objective: Vital signs in last 24 hours: Temp:  [97.8 F (36.6 C)-98.5 F (36.9 C)] 98.2 F (36.8 C) (02/24 0800) Pulse Rate:  [75-93] 86 (02/24 0754) Resp:  [16-28] 21 (02/24 0700) BP: (97-146)/(45-96) 118/55 (02/24 0754) SpO2:  [87 %-97 %] 92 % (02/24 0754) Arterial Line BP: (141)/(63) 141/63 (02/23 1200) FiO2 (%):  [60 %] 60 % (02/24 0515) Weight:  [103.1 kg-106.4 kg] 103.1 kg (02/23 1734) Weight change:   Intake/Output from previous day: 02/23 0701 - 02/24 0700 In: 1637.4 [I.V.:892.5; IV Piggyback:744.9] Out: 3000  Intake/Output this shift: No intake/output data recorded.  General appearance: alert, cooperative, moderately obese and pale Resp: diminished breath sounds bilaterally and rhonchi bilaterally Cardio: S1, S2 normal and systolic murmur: systolic ejection 2/6, crescendo and decrescendo at 2nd left intercostal space GI: obese, pos bs, soft, liver down 6 cm Extremities: RTMA, LUA AVf  Lab Results: Recent Labs    03/03/18 0501 03/04/18 0435 03/04/18 0438  WBC 34.9*  --  32.2*  HGB 10.3* 12.9 10.2*  HCT 35.7* 38.0 35.8*  PLT 315  --  317   BMET:  Recent Labs    03/03/18 0501 03/04/18 0435 03/04/18 0438  NA 136 130* 135  K 4.2 3.1* 3.7  CL 95*  --  95*  CO2 20*  --  23  GLUCOSE 219*  --  214*  BUN 39*  --  27*  CREATININE 6.85*  --  4.84*  CALCIUM 8.5*  --  8.3*   No results for input(s): PTH in the last 72 hours. Iron Studies: No results for input(s): IRON, TIBC, TRANSFERRIN, FERRITIN in the last 72 hours.  Studies/Results: Dg Chest Port 1 View  Result Date: 03/03/2018 CLINICAL DATA:  Acute respiratory failure. History CHF. Diabetes. Hypertension. EXAM: PORTABLE CHEST 1 VIEW COMPARISON:  1 day prior FINDINGS: Midline trachea. Cardiomegaly accentuated by AP portable technique. No pleural effusion or pneumothorax. Persistently low lung volumes. No change in interstitial and basilar  predominant airspace disease. IMPRESSION: 1. No significant change since the prior exam. 2. Cardiomegaly with low lung volumes and interstitial and airspace disease. Pulmonary edema and/or infection. Electronically Signed   By: Abigail Miyamoto M.D.   On: 03/03/2018 08:23    I have reviewed the patient's current medications.  Assessment/Plan: 1 ESRD for extra HD.  Not clear has a lot of vol 2 Resp compromise, ??asp 3 Arrest unclear etio 4 Dm controlled 5 Obesity 6 PVD post TMA 7 Anemia esa.  8 HPTH vit D, binders P HD, esa, ab, monitor    LOS: 7 days   Diamante Truszkowski 03/04/2018,10:30 AM

## 2018-03-04 NOTE — Procedures (Signed)
I was present at this session.  I have reviewed the session itself and made appropriate changes.  HD via UA avf.  Will be cautious with bps, low vol off.  Jeneen Rinks Brittni Hult 2/24/20201:24 PM

## 2018-03-04 NOTE — Progress Notes (Signed)
ANTICOAGULATION CONSULT NOTE - Follow Up Consult  Pharmacy Consult for IV Heparin Indication: DVT  No Known Allergies  Patient Measurements: Height: 5\' 4"  (162.6 cm) Weight: 227 lb 4.7 oz (103.1 kg) IBW/kg (Calculated) : 54.7 Heparin Dosing Weight: 80 kg  Vital Signs: Temp: 98.2 F (36.8 C) (02/24 0800) Temp Source: Oral (02/24 0800) BP: 118/55 (02/24 0754) Pulse Rate: 86 (02/24 0754)  Labs: Recent Labs    03/01/18 1515  03/01/18 1850 03/02/18 0517  03/03/18 0501 03/04/18 0435 03/04/18 0438 03/04/18 0706  HGB  --   --   --  11.1*   < > 10.3* 12.9 10.2*  --   HCT  --   --   --  38.8   < > 35.7* 38.0 35.8*  --   PLT  --   --   --  325  --  315  --  317  --   LABPROT  --   --   --  16.1*  --  15.3*  --  15.8*  --   INR  --   --   --  1.30  --  1.23  --  1.27  --   HEPARINUNFRC  --    < > 0.31 0.16*   < > 0.43  --  0.80* 0.43  CREATININE  --   --   --  4.64*  --  6.85*  --  4.84*  --   TROPONINI 0.75*  --  0.63*  --   --   --   --   --   --    < > = values in this interval not displayed.   Estimated Creatinine Clearance: 14.6 mL/min (A) (by C-G formula based on SCr of 4.84 mg/dL (H)).  Assessment: 58 YO F with new DVT and ?PE, continues on IV heparin. Initial heparin level drawn incorrectly, resulting in supratherapeutic value of 0.80. Re-drawn correctly and was within goal range at 0.43 this morning. Hemoglobin stable at 10.2, platelets stable WNL. No bleeding noted.  Goal of Therapy:  Heparin level 0.3-0.7 units/ml Monitor platelets by anticoagulation protocol: Yes   Plan:  Continue heparin gtt at 1550 units/hr Daily heparin level and CBC Monitor for s/sx of bleeding  Patrina Levering, PharmD Candidate 03/04/2018,12:49 PM

## 2018-03-04 NOTE — Progress Notes (Signed)
Inpatient Diabetes Program Recommendations  AACE/ADA: New Consensus Statement on Inpatient Glycemic Control (2015)  Target Ranges:  Prepandial:   less than 140 mg/dL      Peak postprandial:   less than 180 mg/dL (1-2 hours)      Critically ill patients:  140 - 180 mg/dL   Lab Results  Component Value Date   GLUCAP 221 (H) 03/04/2018   HGBA1C 11.4 (H) 02/26/2018    Review of Glycemic Control  FBSs in low 200s. Would benefit from restarting part of Levemir.  Inpatient Diabetes Program Recommendations:    Add Levemir 10 units bid.  Will continue to follow.  Thank you. Lorenda Peck, RD, LDN, CDE Inpatient Diabetes Coordinator [phone]

## 2018-03-04 NOTE — Progress Notes (Signed)
Patient ID: Early Chars, female   DOB: [birth year], 58 y.o.   MRN: [id number] Patient is postoperative day 3 amputation of the second and third rays.  There is no drainage in the wound VAC canister.  Patient should be nonweightbearing on the operative foot.  The wound VAC should be discontinued 1 week postoperatively.  I will need to follow-up in the office 1 week after surgery.

## 2018-03-04 NOTE — Progress Notes (Signed)
Was paged to patient's room regarding low blood pressures 61/46 with MAP 50.  Also of report of patient "seeing spots" and angels flying to the room.  I ordered normal saline 500 cc over 2 hours due to patient's ESRD status, but blood pressures improved to 120/69 without fluid administration. Patient satting 90% on high flow nasal cannula, alert and oriented x3.  No other concerns or complaints at this time.  General: Alert and oriented, no acute distress CV: RRR, S1-S2 present, no murmurs, rubs, gallops Lungs: Coarse breath sounds throughout, patient satting 90% on high flow nasal cannula Abdomen: Normal bowel sounds in 4 quadrants, nontender, soft Extremities: Wound VAC placed to right foot, patient moving all 4 extremities spontaneously, 5/5 strength in all 4 extremities  Expectant management: -Nurse encouraged to page family medicine should any new changes arise with Ms. Wickliffe, [phone] -plan to bolus 500 cc over 1 hour if patient blood pressures decrease again -If patient begins to desatted will consider placing patient back on BiPAP.  Milus Banister, [place], PGY-1 03/04/2018 8:30 PM

## 2018-03-04 NOTE — Progress Notes (Signed)
Placed patient on BIPAP per order for HS use.

## 2018-03-04 NOTE — Progress Notes (Signed)
eLink Physician-Brief Progress Note Patient Name: Rachel Chaney DOB: 04-21-1960 MRN: [id number]   Date of Service  03/04/2018  HPI/Events of Note  Hypotension - BP =83/49 with MAP = 59. Albumin = 2.1   eICU Interventions  Will order: 1. 5% Albumin 12.5 gm IV now.      Intervention Category Major Interventions: Hypotension - evaluation and management  Latavia Goga [place] 03/04/2018, 2:43 AM

## 2018-03-04 NOTE — Progress Notes (Signed)
Physical Therapy Treatment Patient Details Name: Rachel Chaney MRN: [id number] DOB: 1960/03/23 Today's Date: 03/04/2018    History of Present Illness 58 year old female who admitted for crush injury of foot. 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 Wound culture grew S. Aureus with sensitivities pending. During dialysis, patient was found unresponsive. CPR was performed, she was intubated 2/20 and transferred to ICU. Presumed PE (IV team noted thrombus in RUE cephalic vein) Extubated 2/37/62 PMH: ESRD on HD, HTN, DM2, GERD    PT Comments    Pt pleasant on 6L HFNC throughout session with spO2 89-93% during activity. PT with flat affect, impaired cognition to orientation, command following and problem solving. Pt unable to maintain NWB RLE and trying to stand on only RLE at EOB with LLE not even touching the floor and pt attempting to stand x 3 trials. Pt with cognitive deficits, impaired balance and function significantly limiting mobility and at this time SNF more appropriate D/C plan as pt not progressing toward PLOF.     Follow Up Recommendations  SNF;Supervision/Assistance - 24 hour     Equipment Recommendations  Wheelchair (measurements PT)    Recommendations for Other Services       Precautions / Restrictions Precautions Precautions: Fall Precaution Comments: Rt fem central line, VAC Restrictions RLE Weight Bearing: Non weight bearing    Mobility  Bed Mobility Overal bed mobility: Needs Assistance Bed Mobility: Supine to Sit;Sit to Supine     Supine to sit: Mod assist;+2 for safety/equipment Sit to supine: Max assist;+2 for physical assistance   General bed mobility comments: mod assist to bring RLE to EOB, HOB elevated 30 degrees with use of rail and increased time to pivot to EOB. Pt with max assist to bring legs to surface and return to supine as pt not following commands for sequence  Transfers Overall transfer level: Needs  assistance               General transfer comment: attempted x 3 trials with pt putting weight through RLE and not placing LLE on the ground, unsafe to attempt further standing and pt without drop arm for lateral scoot. Placed in bed in chair position  Ambulation/Gait             General Gait Details: unable   Stairs             Wheelchair Mobility    Modified Rankin (Stroke Patients Only)       Balance Overall balance assessment: Needs assistance Sitting-balance support: Bilateral upper extremity supported;Feet unsupported Sitting balance-Leahy Scale: Poor Sitting balance - Comments: min assist for balance initially EOB with progression to minguard grossly 5 min                                    Cognition Arousal/Alertness: Awake/alert Behavior During Therapy: Flat affect Overall Cognitive Status: Impaired/Different from baseline Area of Impairment: Orientation;Attention;Memory;Following commands;Problem solving;Safety/judgement                 Orientation Level: Disoriented to;Time;Situation Current Attention Level: Focused Memory: Decreased short-term memory;Decreased recall of precautions Following Commands: Follows one step commands inconsistently;Follows one step commands with increased time Safety/Judgement: Decreased awareness of safety;Decreased awareness of deficits   Problem Solving: Slow processing;Decreased initiation General Comments: pt unable to follow commands to maintain NWB RLE and having difficulty placing weight on LLE with command trying  to alternate weight bearing commands      Exercises General Exercises - Lower Extremity Long Arc Quad: AROM;10 reps;Seated;Both Hip Flexion/Marching: AROM;10 reps;Seated;Both    General Comments        Pertinent Vitals/Pain Pain Assessment: No/denies pain    Home Living                      Prior Function            PT Goals (current goals can now be  found in the care plan section) Progress towards PT goals: Not progressing toward goals - comment;Goals downgraded-see care plan    Frequency    Min 3X/week      PT Plan Discharge plan needs to be updated    Co-evaluation              AM-PAC PT "6 Clicks" Mobility   Outcome Measure  Help needed turning from your back to your side while in a flat bed without using bedrails?: A Lot Help needed moving from lying on your back to sitting on the side of a flat bed without using bedrails?: A Lot Help needed moving to and from a bed to a chair (including a wheelchair)?: Total Help needed standing up from a chair using your arms (e.g., wheelchair or bedside chair)?: Total Help needed to walk in hospital room?: Total Help needed climbing 3-5 steps with a railing? : Total 6 Click Score: 8    End of Session Equipment Utilized During Treatment: Oxygen;Gait belt Activity Tolerance: Patient tolerated treatment well Patient left: in bed;with call bell/phone within reach;with bed alarm set;with family/visitor present Nurse Communication: Mobility status;Need for lift equipment;Precautions PT Visit Diagnosis: Muscle weakness (generalized) (M62.81);Other symptoms and signs involving the nervous system (R29.898);Other abnormalities of gait and mobility (R26.89);Difficulty in walking, not elsewhere classified (R26.2)     Time: 6144-3154 PT Time Calculation (min) (ACUTE ONLY): 31 min  Charges:  $Therapeutic Exercise: 8-22 mins $Therapeutic Activity: 8-22 mins                     [place], PT Acute Rehabilitation Services Pager: [phone] Office: [place] 03/04/2018, 11:57 AM

## 2018-03-04 NOTE — Progress Notes (Addendum)
Pharmacy Antibiotic Note  Rachel Chaney is a 58 y.o. female admitted on 02/25/2018 with right foot wound infection, s/p 2nd and 3rd right toe amputation. During surgery, a large abcess was found that extended to the midfoot. Wound culture has finalized and is growing MSSA. Pharmacy has been consulted for cefazolin dosing. Today is day 8 of antibiotics. Patient received extra HD yesterday, 2/23, but did not receive dose of cefazolin post-HD. Patient is scheduled for HD today to resume normal MWF schedule.   Plan: Cefazolin 2g IV post HD Will follow for next HD session Follow up on LOT  Height: 5\' 4"  (162.6 cm) Weight: 227 lb 4.7 oz (103.1 kg) IBW/kg (Calculated) : 54.7  Temp (24hrs), Avg:98.2 F (36.8 C), Min:97.8 F (36.6 C), Max:99.1 F (37.3 C)  Recent Labs  Lab 02/28/18 0524  02/28/18 2232 03/01/18 0659 03/02/18 0517 03/03/18 0501 03/04/18 0438  WBC 30.7*  --   --  35.1* 37.4* 34.9* 32.2*  CREATININE 7.97*   < > 5.28* 5.71* 4.64* 6.85* 4.84*  LATICACIDVEN  --   --  0.8 0.9  --   --   --    < > = values in this interval not displayed.    Estimated Creatinine Clearance: 14.6 mL/min (A) (by C-G formula based on SCr of 4.84 mg/dL (H)).    No Known Allergies  Antimicrobials this admission: Vancomycin 2/17 >> 2/21 Ceftriaxone 2/17 >> 2/21 Flagyl 2/21 >> 2/24 Ancef 2/21 >>  Microbiology results: 2/21 BCx >> NGTD x 2 days 2/21 Abscess R-Foot >>mod MSSA, final 2/18 MRSA pcr negative; St aureus +   Thank you for allowing pharmacy to be a part of this patient's care.  Patrina Levering, PharmD Candidate 03/04/2018 12:57 PM

## 2018-03-05 ENCOUNTER — Inpatient Hospital Stay (HOSPITAL_COMMUNITY): Payer: Medicare Other

## 2018-03-05 LAB — MAGNESIUM: Magnesium: 2.1 mg/dL (ref 1.7–2.4)

## 2018-03-05 LAB — GLUCOSE, CAPILLARY
Glucose-Capillary: 225 mg/dL — ABNORMAL HIGH (ref 70–99)
Glucose-Capillary: 222 mg/dL — ABNORMAL HIGH (ref 70–99)
Glucose-Capillary: 226 mg/dL — ABNORMAL HIGH (ref 70–99)
Glucose-Capillary: 236 mg/dL — ABNORMAL HIGH (ref 70–99)

## 2018-03-05 LAB — CBC
HCT: 36.5 % (ref 36.0–46.0)
Hemoglobin: 10.5 g/dL — ABNORMAL LOW (ref 12.0–15.0)
MCH: 31.6 pg (ref 26.0–34.0)
MCHC: 28.8 g/dL — ABNORMAL LOW (ref 30.0–36.0)
MCV: 109.9 fL — ABNORMAL HIGH (ref 80.0–100.0)
Platelets: 338 10*3/uL (ref 150–400)
RBC: 3.32 MIL/uL — ABNORMAL LOW (ref 3.87–5.11)
RDW: 15.4 % (ref 11.5–15.5)
WBC: 33.4 10*3/uL — ABNORMAL HIGH (ref 4.0–10.5)
nRBC: 0.7 % — ABNORMAL HIGH (ref 0.0–0.2)

## 2018-03-05 LAB — RENAL FUNCTION PANEL
Albumin: 2.2 g/dL — ABNORMAL LOW (ref 3.5–5.0)
Anion gap: 15 (ref 5–15)
BUN: 27 mg/dL — ABNORMAL HIGH (ref 6–20)
CO2: 22 mmol/L (ref 22–32)
Calcium: 8.7 mg/dL — ABNORMAL LOW (ref 8.9–10.3)
Chloride: 98 mmol/L (ref 98–111)
Creatinine, Ser: 3.99 mg/dL — ABNORMAL HIGH (ref 0.44–1.00)
GFR calc Af Amer: 13 mL/min — ABNORMAL LOW (ref >60)
GFR calc non Af Amer: 12 mL/min — ABNORMAL LOW (ref >60)
Glucose, Bld: 251 mg/dL — ABNORMAL HIGH (ref 70–99)
Phosphorus: 4.3 mg/dL (ref 2.5–4.6)
Potassium: 3.7 mmol/L (ref 3.5–5.1)
Sodium: 135 mmol/L (ref 135–145)

## 2018-03-05 LAB — PROTIME-INR
INR: 1.5 — ABNORMAL HIGH (ref 0.8–1.2)
Prothrombin Time: 17.5 seconds — ABNORMAL HIGH (ref 11.4–15.2)

## 2018-03-05 LAB — HEPARIN LEVEL (UNFRACTIONATED)
Heparin Unfractionated: 1.32 IU/mL — ABNORMAL HIGH (ref 0.30–0.70)
Heparin Unfractionated: 0.4 IU/mL (ref 0.30–0.70)

## 2018-03-05 MED ORDER — BACID PO TABS
2.0000 | ORAL_TABLET | Freq: Three times a day (TID) | ORAL | Status: DC
  Administered 2018-03-05 – 2018-03-14 (×27): 2 via ORAL
  Filled 2018-03-05 (×33): qty 2

## 2018-03-05 MED ORDER — CHLORHEXIDINE GLUCONATE 0.12 % MT SOLN
OROMUCOSAL | Status: AC
  Filled 2018-03-05: qty 15

## 2018-03-05 MED ORDER — PRO-STAT SUGAR FREE PO LIQD
30.0000 mL | Freq: Two times a day (BID) | ORAL | Status: DC
  Administered 2018-03-05 – 2018-03-14 (×17): 30 mL via ORAL
  Filled 2018-03-05 (×17): qty 30

## 2018-03-05 MED ORDER — CEFAZOLIN SODIUM-DEXTROSE 2-4 GM/100ML-% IV SOLN
2.0000 g | Freq: Once | INTRAVENOUS | Status: AC
  Administered 2018-03-05: 2 g via INTRAVENOUS
  Filled 2018-03-05: qty 100

## 2018-03-05 NOTE — Progress Notes (Signed)
Physical Therapy Treatment Patient Details Name: Rachel Chaney MRN: [id number] DOB: 1960-04-06 Today's Date: 03/05/2018    History of Present Illness 58 year old female who admitted for crush injury of foot. 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 Wound culture grew S. Aureus with sensitivities pending. During dialysis, patient was found unresponsive. CPR was performed, she was intubated 2/20 and transferred to ICU. Presumed PE (IV team noted thrombus in RUE cephalic vein) Extubated 0/76/22 PMH: ESRD on HD, HTN, DM2, GERD    PT Comments    Pt more alert today however continues to have significantly impaired vision, sequencing, problem solving, comprehension, and initiation. Pt able to tolerate and complete lateral scoot transfer to drop arm chair today with maxAx2 using bed pad underneath pt. Pt remains unable to maintain R LE NWB. Acute PT to cont to follow.    Follow Up Recommendations  SNF;Supervision/Assistance - 24 hour     Equipment Recommendations  Wheelchair (measurements PT)    Recommendations for Other Services OT consult     Precautions / Restrictions Precautions Precautions: Fall Precaution Comments: Rt femoral central line - cleared by MD to mobilize OOB to chair, wound  vac Restrictions Weight Bearing Restrictions: Yes RLE Weight Bearing: Non weight bearing    Mobility  Bed Mobility Overal bed mobility: Needs Assistance Bed Mobility: Supine to Sit     Supine to sit: Mod assist;+2 for safety/equipment     General bed mobility comments: pt initiated and brought LEs off EOB but then required verbal cues to continue transfer of trunk elevation to complete sitting EOB, modAx2 to complete transfer and sit squarely on the EOB  Transfers Overall transfer level: Needs assistance Equipment used: (2 person lift with pad) Transfers: Lateral/Scoot Transfers          Lateral/Scoot Transfers: Max assist;+2 physical assistance;+2  safety/equipment General transfer comment: attemptedx3 to complete sit to stand however pt uanble to maintain R LE NWB despite max verbal cues. Pt then complete lateral scoot transfer to drop arm chair toward the Left. it took 4 lateral scoots, pt increased effort and ability to assist with transfer with each scoot  Ambulation/Gait             General Gait Details: unable   Stairs             Wheelchair Mobility    Modified Rankin (Stroke Patients Only)       Balance Overall balance assessment: Needs assistance Sitting-balance support: Bilateral upper extremity supported;Feet supported Sitting balance-Leahy Scale: Poor Sitting balance - Comments: pt dependent on bilat UEs to support self EOB                                    Cognition Arousal/Alertness: Awake/alert Behavior During Therapy: Flat affect Overall Cognitive Status: Impaired/Different from baseline Area of Impairment: Orientation;Attention;Memory;Following commands;Awareness;Safety/judgement;Problem solving                 Orientation Level: (pt oriented today) Current Attention Level: Focused(easily distracted) Memory: Decreased short-term memory;Decreased recall of precautions(unaware of R LE NWB) Following Commands: Follows one step commands inconsistently;Follows one step commands with increased time Safety/Judgement: Decreased awareness of deficits   Problem Solving: Slow processing;Difficulty sequencing;Requires verbal cues;Requires tactile cues General Comments: pt more alert today. Pt following 1 step/simple commands majority of time however she is unable to follow multistep commands. Pt also with vision deficits  as well but unable to follow commands to assess the severity of the deficits      Exercises      General Comments General comments (skin integrity, edema, etc.): Pt with R LE wound vace and on 5L of high flow oxygen.      Pertinent Vitals/Pain Pain  Assessment: Faces Faces Pain Scale: Hurts little more Pain Location: chest from CPR    Home Living                      Prior Function            PT Goals (current goals can now be found in the care plan section) Acute Rehab PT Goals Patient Stated Goal: none stated Progress towards PT goals: Progressing toward goals    Frequency    Min 3X/week      PT Plan Discharge plan needs to be updated    Co-evaluation PT/OT/SLP Co-Evaluation/Treatment: Yes Reason for Co-Treatment: Complexity of the patient's impairments (multi-system involvement) PT goals addressed during session: Mobility/safety with mobility        AM-PAC PT "6 Clicks" Mobility   Outcome Measure  Help needed turning from your back to your side while in a flat bed without using bedrails?: A Lot Help needed moving from lying on your back to sitting on the side of a flat bed without using bedrails?: A Lot Help needed moving to and from a bed to a chair (including a wheelchair)?: A Lot Help needed standing up from a chair using your arms (e.g., wheelchair or bedside chair)?: Total Help needed to walk in hospital room?: Total Help needed climbing 3-5 steps with a railing? : Total 6 Click Score: 9    End of Session Equipment Utilized During Treatment: Gait belt;Oxygen Activity Tolerance: Patient tolerated treatment well Patient left: in chair;with call bell/phone within reach;with family/visitor present Nurse Communication: Mobility status;Need for lift equipment(lift pad underneath patient) PT Visit Diagnosis: Muscle weakness (generalized) (M62.81);Other symptoms and signs involving the nervous system (R29.898);Other abnormalities of gait and mobility (R26.89);Difficulty in walking, not elsewhere classified (R26.2)     Time: 8115-7262 PT Time Calculation (min) (ACUTE ONLY): 31 min  Charges:  $Therapeutic Activity: 8-22 mins                     Kittie Plater, PT, DPT Acute Rehabilitation  Services Pager #: [phone] Office #: [phone]    Berline Lopes 03/05/2018, 12:52 PM

## 2018-03-05 NOTE — Progress Notes (Signed)
FPTS Interim Progress Note  S:Went to check on patient. Alert and oriented x3. Tolerating bipap well. Overall looks improved. Speaking louder and in complete sentences.   O: BP 131/67   Pulse 98   Temp 98.2 F (36.8 C) (Oral)   Resp (!) 26   Ht 5\' 4"  (1.626 m)   Wt 103.1 kg   LMP 09/18/2011   SpO2 95%   BMI 39.01 kg/m   Gen: awake and alert, oriented x3, bipap in place Cardio: RRR, no MRG Resp: CTAB, no wheezes, rales, or rhonchi   A/P: AMS Resolved. Oriented x3. BP improved  -continue bipap qhs -monitor closely  -continue current plan   Caroline More, DO 03/05/2018, 3:58 AM PGY-2, [place] Medicine Service pager [phone]

## 2018-03-05 NOTE — Consult Note (Signed)
[facility] Nurse wound consult note Patient receiving care in [facility] 4N20.  I spoke with the patient's primary RN, Ellie, via telephone about the consult I am responding to. Reason for Consult: "Surgical wound on foot" Per Dr. Jess Barters note from 03/04/18 at 8:08 a.m. "  Patient is postoperative day 3 amputation of the second and third rays.  There is no drainage in the wound VAC canister.  Patient should be nonweightbearing on the operative foot.  The wound VAC should be discontinued 1 week postoperatively.  I will need to follow-up in the office 1 week after surgery."           ALL QUESTIONS RELATED TO THE CARE OF THIS WOUND MUST BE DIRECTED TO DR. DUDA.  Thank you for the consult.  Discussed plan of care with the bedside nurse.  [facility] nurse will not follow at this time.  Please re-consult the [facility] team if needed.  Val Riles, RN, MSN, CWOCN, CNS-BC, pager [phone]

## 2018-03-05 NOTE — Progress Notes (Signed)
Patient ID: Early Chars, female   DOB: July 21, 1960, 58 y.o.   MRN: [id number]  Asked to assess surgical wound 2/2 persistently elevated WBC. Prevena VAC removed, surgical incisions clean w/o erythema, odor, or purulent discharge. Redressed. WBC not coming from surgical site. Dr. Sharol Given will evaluate personally in AM. Please continue IV abx for now.    Lisette Abu, PA-C Orthopedic Surgery [phone]

## 2018-03-05 NOTE — Progress Notes (Signed)
Placed patient on CPAP via FFM, auto settings with 10 lpm O2 bleed in.

## 2018-03-05 NOTE — Progress Notes (Signed)
ANTICOAGULATION CONSULT NOTE - Follow Up Consult  Pharmacy Consult for IV Heparin Indication: DVT  No Known Allergies  Patient Measurements: Height: 5\' 4"  (162.6 cm) Weight: (unable to obtain, error message) IBW/kg (Calculated) : 54.7 Heparin Dosing Weight: 80 kg  Vital Signs: Temp: 98.4 F (36.9 C) (02/25 1558) Temp Source: Oral (02/25 1558) BP: 142/79 (02/25 1400) Pulse Rate: 86 (02/25 1300)  Labs: Recent Labs    03/03/18 0501 03/04/18 0435 03/04/18 0438 03/04/18 0706 03/05/18 0500 03/05/18 1450  HGB 10.3* 12.9 10.2*  --  10.5*  --   HCT 35.7* 38.0 35.8*  --  36.5  --   PLT 315  --  317  --  338  --   LABPROT 15.3*  --  15.8*  --  17.5*  --   INR 1.23  --  1.27  --  1.5*  --   HEPARINUNFRC 0.43  --  0.80* 0.43 1.32* 0.40  CREATININE 6.85*  --  4.84*  --  3.99*  --    Estimated Creatinine Clearance: 17.8 mL/min (A) (by C-G formula based on SCr of 3.99 mg/dL (H)).  Assessment: 58 YO F with new DVT and ?PE, continues on IV heparin. Heparin level this pm reported therapeutic at 0.4.  No bleeding reported  Goal of Therapy:  Heparin level 0.3-0.7 units/ml Monitor platelets by anticoagulation protocol: Yes   Plan:  Continue heparin gtt to 1300 units/hr Daily heparin level and CBC Monitor for s/sx of bleeding  Alanda Slim, PharmD, [facility] Clinical Pharmacist Please see AMION for all Pharmacists' Contact Phone Numbers 03/05/2018, 4:21 PM

## 2018-03-05 NOTE — Progress Notes (Signed)
ANTICOAGULATION CONSULT NOTE - Follow Up Consult  Pharmacy Consult for IV Heparin Indication: DVT  No Known Allergies  Patient Measurements: Height: 5\' 4"  (162.6 cm) Weight: (unable to obtain, error message) IBW/kg (Calculated) : 54.7 Heparin Dosing Weight: 80 kg  Vital Signs: Temp: 98.2 F (36.8 C) (02/25 0400) Temp Source: Axillary (02/25 0400) BP: 140/63 (02/25 0700) Pulse Rate: 83 (02/25 0700)  Labs: Recent Labs    03/03/18 0501 03/04/18 0435 03/04/18 0438 03/04/18 0706 03/05/18 0500  HGB 10.3* 12.9 10.2*  --  10.5*  HCT 35.7* 38.0 35.8*  --  36.5  PLT 315  --  317  --  338  LABPROT 15.3*  --  15.8*  --  17.5*  INR 1.23  --  1.27  --  1.5*  HEPARINUNFRC 0.43  --  0.80* 0.43 1.32*  CREATININE 6.85*  --  4.84*  --  3.99*   Estimated Creatinine Clearance: 17.8 mL/min (A) (by C-G formula based on SCr of 3.99 mg/dL (H)).  Assessment: 58 YO F with new DVT and ?PE, continues on IV heparin. Heparin level this am reported at >2.2, then lab corrected to 1.32.  No bleeding reported  Goal of Therapy:  Heparin level 0.3-0.7 units/ml Monitor platelets by anticoagulation protocol: Yes   Plan:  Decrease heparin gtt to 1300 units/hr Check heparin level 6-8 hours after rate change Daily heparin level and CBC Monitor for s/sx of bleeding  Philomina Leon, Alex Gardener, PharmD 03/05/2018,7:24 AM

## 2018-03-05 NOTE — Progress Notes (Signed)
Inpatient Diabetes Program Recommendations  AACE/ADA: New Consensus Statement on Inpatient Glycemic Control  Target Ranges:  Prepandial:   less than 140 mg/dL      Peak postprandial:   less than 180 mg/dL (1-2 hours)      Critically ill patients:  140 - 180 mg/dL  Results for Rachel Chaney, Rachel Chaney (MRN [id number]) as of 03/05/2018 16:28  Ref. Range 03/04/2018 07:47 03/04/2018 11:23 03/04/2018 19:50 03/04/2018 22:41 03/05/2018 07:50 03/05/2018 11:25  Glucose-Capillary Latest Ref Range: 70 - 99 mg/dL 221 (H) 278 (H) 181 (H) 243 (H) 225 (H) 222 (H)   Results for Rachel Chaney, Rachel Chaney (MRN [id number]) as of 03/05/2018 16:28  Ref. Range 02/26/2018 03:48  Hemoglobin A1C Latest Ref Range: 4.8 - 5.6 % 11.4 (H)   Review of Glycemic Control  Diabetes history: DM2 Outpatient Diabetes medications: Levemir 50 units BID Current orders for Inpatient glycemic control: Lantus 15 units daily, Novolog 0-9 units TID with meals  Inpatient Diabetes Program Recommendations:  Insulin - Basal: Noted Lantus 15 units daily started today. Insulin - Meal Coverage: If post prandial glucose is consistently greater than 180 mg/dl, please consider ordering Novolog 4 units TID with meals for meal coverage if patient eats at least 50% of meals. Insulin-Correction: Please consider ordering bedtime insulin correction (Novolog 0-5 units QHS). HgbA1C: A1C 11.4% on 02/26/18 indicating an average glucose of 280 mg/dl over the past 2-3 months.  NOTE: Spoke with patient about diabetes and home regimen for diabetes control. Patient reports that she is followed by PCP for diabetes management and currently she takes Levemir 50 units BID as an outpatient for diabetes control. Patient reports that she is taking insulin as prescribed.  Patient reports that no changes were made with insulin at last office visit with PCP. Patient states that she checks her glucose 1 time per day in the mornings and that glucose has ranged from 140-200 mg/dl over the past  1-2 weeks.  Inquired about prior A1C and patient reports that her last A1C was 8.8% a couple of months ago.  Discussed A1C results (11.4% on 02/26/18) and explained that current A1C indicates an average glucose of 280 mg/dl over the past 2-3 months. Discussed glucose and A1C goals. Discussed importance of checking CBGs and maintaining good CBG control to prevent long-term and short-term complications. Explained how hyperglycemia leads to damage within blood vessels which lead to the common complications seen with uncontrolled diabetes. Stressed to the patient the importance of improving glycemic control to prevent further complications from uncontrolled diabetes and to promote wound healing.  Discussed impact of nutrition, exercise, stress, sickness, and medications on diabetes control. Patient states that she follows a diabetic diet. However, in asking about examples of typical meals she is drinking excessive amounts of juice, regular ginger-ale, and lots of fruit (especially pineapple).  Discussed carbohydrates, carbohydrate goals per day and meal, along with portion sizes. Encouraged patient to eliminate sugary beverages from diet and to limit portion size of fruit and try to eat fruits with lower glycemic index such as berries. Patient prefers pineapple and watermelon; encouraged to limit portion size and moderate how often she is eating pineapple.  Encouraged patient to check glucose at least 2-3 times per day as glucose may be more elevated during the day. Asked patient to keep a log book of glucose readings and insulin taken which she will need to take to doctor appointments so her PCP can use the information to make insulin adjustments if needed. Patient verbalized understanding of information  discussed and she states that she has no further questions at this time related to diabetes.  Thanks, Barnie Alderman, RN, MSN, CDE Diabetes Coordinator Inpatient Diabetes Program [phone] (Team Pager)

## 2018-03-05 NOTE — Progress Notes (Addendum)
Family Medicine Teaching Service Daily Progress Note Intern Pager: [phone]  Patient name: Rachel Chaney Medical record number: [id number] Date of birth: 06-05-1960 Age: 58 y.o. Gender: female  Primary Care Provider: Bufford Lope, DO Consultants: Nephrology, Orthopedics Code Status: Full code  Pt Overview and Major Events to Date:  2/17 > admit 2/19 > Toe amputation on right foot 2/20 > Cardiac arrest, intubation, transferred to ICU 2/20 > started on heparin gtt empirically for presumed PE (IV team noted thrombus in RUE cephalic vein) 0/62: Extubated 2/22: Nursing staff reporting more lethargic, had been transferred to medicine service, new hypercarbic respiratory failure.  Placed on BiPAP 2/23: Remains off blood pressure medications for several days now in spite of what it says on MAR.  Hemodynamically stable.  Much more awake.  Suspect episode on 2/23 primarily due to hypercarbia.  Assessment and Plan: Rachel I Dorsettis a 58 y.o.femalepresenting with painful 2nd toe of R foot. PMH is significant forESRD on HD MWF,T2DM,HTN, GERD, CHF, and dyslipidemia.  #Right second toe fractureOPEN, soft tissue infection, concerning for osteomyelitis: Initially admitted for soft tissue infection of R toe and was transferred to ICU on 2/20 after cardiac arrest of unknown etiology, now back in our care. Patient w/ R toe open fx, soft tissue infection and osteo In the setting of uncontrolled diabetes now s/p amputation of 2nd and 3rd right toe on 2/19. Was found to have large purulent abscess extending to the midfoot. Cultures obtained x2, positive for S. Aureus, pan-sensitive. Patient has remained afebrile but white count remains elevated with  WBC 32.2 yesterday and 33.4 today. Antibiotic course as below. Sepsis from toe wound is possible etiology of cardiac arrest but BCx negative and patient has been on broad-spectrum antibiotics. Wound vac due to be taken off one week post-surgery (tomorrow)  and primary team has not been able to evaluate wound, Ortho will evaluate. - S/p amputation 2nd and 3rd R toes 2/19 - S/p IV Ancef (2/17), IV CTX and IV Vanc (2/17 - 2/21), IV metronidazole (2/21 - 2/24) and IV Ancef (2/21 - ) - Continue IV Ancef, will f/u with ortho to clarify antibiotic duration - F/U ortho recs - D/C metronidazole given no gram neg or anaerobe growth on culture - PT/OT - Would benefit from mobilization, but pt should be nonweightbearing on the operative foot - Transfer to Step-down - Wound Care consult  #Acute hypoxic and hypercarbic respiratory failure Likely multifactorial with likely underlying OSA and OHS with atelectasis and volume excess. Repeat CXR today with progressive bilateral pulmonary interstitial prominence c/f CHF, pneumonitis cannot be excluded. Have been taking of fluid with dialysis but patient's blood pressure is tenuous. Need to consider that interstitial prominence may not be all fluid and could be due to aspiration pneumonitis. Currently treating for possible PE as below given post-surgical acute onset respiratory distress and RUE thrombus noted. No CTA given ESRD, would consider V/Q scan if patient's lungs clear. Patient with continued elevated WBC and new Bipap requirement over the weekend, leaving atypical PNA on the differential although CXR is less characteristic. - Wean O2, titrate for sat 88-92% - CPAP at night - D/C BiPap - Would benefit from mobilization, but pt should be nonweightbearing on the operative foot - Sleep study as outpatient   #S/p cardiac arrest 2/20 - unclear etiology. Possible PE vs. Sepsis, arrhythmia.  Thought most likely to be due to PE given thrombus in RUE cephalic vein noted by IV team 2/20. Was started on heparin gtt for  presumed PE. Would not do CTA chest per nephro given ESRD. Considered V/Q scan but will hold off for now given an equivocal result would not change management. CXR with pulmonary interstitial prominence  that would make interpretation of V/Q scan difficult. Sepsis from foot wound possible but patient has been on broad-spectrum antibiotics. There could be a PNA that is not covered by current abx, see problem above. Has been monitored on telemetry for possible arrhythmia without event. Extubated 2/21. If patient does have PE, will need 60-month anticoagulation. There are limited trials to evaluate use of DOACs in ESRD or for PE rather than a-fib. For ease of patient use and with discussion with Nephrology, would favor anticoagulation with DOAC rather than warfarin. - Continue Heparin infusion  - Telemetry - Tx empirically w/ 3 months of anticoag - DOAC vs. warfarin for anticoagulation, f/u with neprho - Full code status  #Acute metabolic encephalopathy Patient A&O x3 this AM but had episode of seeing "angels" last night. This is likely delirium given waxing/waning course. Hypercarbia on the differential but pt with bicarb 21.9 yesterday and respiratory status has improved. No focal deficits that would be concerning for stroke. - Continue to monitor AMS - Supportive care  #HTN:Currently hypertensive with widened pulse pressure. Had a reported BP of 61/46 overnight that improved before getting fluids, has been normotensive-hypertensive since receiving fluids.Home regimen includes hydralazine and carvedilol.  - Holding home BP meds given recent hypotension, will restart per nephro - Consider midodrine if hypotensive - Nephrology following, appreciate recs  #Acute musculoskeletal chest pain From CPR on 2/20. Pain improving. - Continue voltaren gel - Tylenol q6h PRN - Avoid PO NSAIDs  #End-stage renal disease on HD Receives hemodialysis MWF at [facility]. Received extra dose of hemodyalisis yesterday 2/23 for volume removal. Due for next session today. - Dialysis per nephro - Nephro following, appreciate recs - Renal/carb diet   #T2DM:HbA1c 11.4% 02/26/18. Patient has history of severe  neuropathy in bilateral lower extremities. Home regimen is 50 units Levemir twice daily. Unclear if she is compliant with this at home given good response to insulin regimen here. FBG 225, pt has diet now. She did not receive Lantus yesterday given she was in dialysis after order was placed. - Start Lantus 15 u  - moderate SSI - CBG monitoring 4 times daily - Consider continuing Lantus for once daily dosing or oral sulfonylurea at discharge  #Diarrhea Patient is on multiple medications that could be causing diarrhea including antibiotics, magnesium citrate, colace and ducolax. She is not currently on opioids to where she would need bowel regimen. 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 Was receiving treatment as outpatient.  - Nephro following, appreciate recs - Continue home calcitriol and auryxia  #HFpEF: Last echo 04/12/2015 with Grade 1 diastolic dysfunction, EF 30-86%. Patient with 1+ peripheral edema on exam but no crackles. Does not take diuretics at home. - Holding home carvedilol as above  #Hypoalbuminemia Albumin 2.6 on admission. No evidence of liver disease. Urinalysis from 2017 with >300 protein indicating that there is likely a nephrotic syndrome component to her hypoalbuminemia. Protein calorie malnutrition given poor diet could also be contributing. S/p albumin 12.5 g in the ED. - Pro-Stat and renal MVI supplementation fell off when switching back from ICU, consider restarting - Nephro following, appreciate recs  #Anemia of CKD:  Hgb 10.8 on admission, stable (baseline in the 12's as outpatient). Per nephro, suspect some blood loss with injury. Has  not yet required ESA. - Daily CBCs  #Hyperlipidemia: Most recent lipid panel April 2019 showing low cholesterol at 94, low HDL at 32, and other values within normal limits. - Continue atorvastatin 80 mg  #GERD: Patient asymptomatic at this time, usually takes Tums at  home. -Monitor for acid reflux symptoms.  FEN/GI: Carb/renal diet PPx: heparin  Disposition: ICU  Subjective:  Patient had episode of BP 61/46 overnight and was reportedly seeing spots and "angels." She was evaluated by the overnight MD and BP improved before giving 500 CC NS.  This morning, patient has no complaints. She is A&Ox3, asking appropriate questions. Chest pain from CPR is improved from before.   Objective: Temp:  [97.9 F (36.6 C)-99.1 F (37.3 C)] 98.2 F (36.8 C) (02/25 0400) Pulse Rate:  [74-99] 83 (02/25 0700) Resp:  [17-29] 20 (02/25 0700) BP: (100-171)/(36-90) 140/63 (02/25 0700) SpO2:  [88 %-100 %] 97 % (02/25 0700) FiO2 (%):  [50 %] 50 % (02/24 2153) Physical Exam: General: lying in bed, on BiPap Cardiovascular: RRR Respiratory: decreased air movement, normal work of breathing, no wheezing or crackles Extremities: no peripheral edema, SCD in place on L leg, bandage on R foot c/d/i Neuro: A&Ox3  Laboratory: Recent Labs  Lab 03/03/18 0501 03/04/18 0435 03/04/18 0438 03/05/18 0500  WBC 34.9*  --  32.2* 33.4*  HGB 10.3* 12.9 10.2* 10.5*  HCT 35.7* 38.0 35.8* 36.5  PLT 315  --  317 338   Recent Labs  Lab 02/28/18 2232  03/03/18 0501 03/04/18 0435 03/04/18 0438 03/05/18 0500  NA 136   < > 136 130* 135 135  K 4.1   < > 4.2 3.1* 3.7 3.7  CL 96*   < > 95*  --  95* 98  CO2 27   < > 20*  --  23 22  BUN 20   < > 39*  --  27* 27*  CREATININE 5.28*   < > 6.85*  --  4.84* 3.99*  CALCIUM 8.1*   < > 8.5*  --  8.3* 8.7*  PROT 6.6  --   --   --   --   --   BILITOT 1.4*  --   --   --   --   --   ALKPHOS 173*  --   --   --   --   --   ALT 96*  --   --   --   --   --   AST 145*  --   --   --   --   --   GLUCOSE 217*   < > 219*  --  214* 251*   < > = values in this interval not displayed.      Imaging/Diagnostic Tests: Dg Foot Complete Right  Result Date: 02/25/2018 CLINICAL DATA:  Toe injury. EXAM: RIGHT FOOT COMPLETE - 3+ VIEW COMPARISON:   Radiographs of May 14, 2012. FINDINGS: Moderately displaced fracture is seen involving the proximal portion of the first proximal phalanx with intra-articular extension with some callus formation suggesting subacute fracture. Minimally displaced fracture is seen involving the proximal portion of the second distal phalanx. Soft tissue irregularity or wound is seen involving the distal portion of the second toe. Mild posterior calcaneal spurring is noted. IMPRESSION: Probable subacute moderately displaced fracture is seen involving the first proximal phalanx with intra-articular extension. Probable minimally displaced acute fracture is seen involving proximal portion of the second distal phalanx. Overlying soft tissue irregularity or wound  is seen involving the distal soft tissues of the second toe. Electronically Signed   By: Marijo Conception, M.D.   On: 02/25/2018 12:17    Mitchell Heir, Medical Student 03/05/2018, 8:34 AM [place] Intern pager: [phone], text pages welcome

## 2018-03-05 NOTE — Progress Notes (Addendum)
[facility] Progress Note   Subjective:  Seen in room with her husband. Denies CP/dyspnea - still with productive cough - using flutter valve. She seems less confused, more clear in thinking/memory today than yesterday.  Objective Vitals:   03/05/18 0500 03/05/18 0600 03/05/18 0700 03/05/18 0800  BP: (!) 131/36 (!) 171/64 140/63 (!) 148/66  Pulse: 86 74 83 87  Resp: 20 (!) 23 20 (!) 22  Temp:    98.7 F (37.1 C)  TempSrc:    Oral  SpO2: 97% (!) 88% 97% 98%  Weight:      Height:       Physical Exam General: Well appearing woman, NAD Heart: RRR; 2/6 SEM Lungs: Scattered rhonchi throughout Abdomen: soft, non-tender obese, liver down 6 cm Extremities: R foot bandaged with wound vac; no LE edema Dialysis Access: LUE AVF + thrill  Additional Objective Labs: Basic Metabolic Panel: Recent Labs  Lab 03/03/18 0501 03/04/18 0435 03/04/18 0438 03/05/18 0500  NA 136 130* 135 135  K 4.2 3.1* 3.7 3.7  CL 95*  --  95* 98  CO2 20*  --  23 22  GLUCOSE 219*  --  214* 251*  BUN 39*  --  27* 27*  CREATININE 6.85*  --  4.84* 3.99*  CALCIUM 8.5*  --  8.3* 8.7*  PHOS 7.8*  --  5.7* 4.3   Liver Function Tests: Recent Labs  Lab 02/28/18 0722 02/28/18 2232 03/05/18 0500  AST  --  145*  --   ALT  --  96*  --   ALKPHOS  --  173*  --   BILITOT  --  1.4*  --   PROT  --  6.6  --   ALBUMIN 2.3* 2.1* 2.2*   CBC: Recent Labs  Lab 02/27/18 0801 02/28/18 0524  03/01/18 0659 03/02/18 0517  03/03/18 0501 03/04/18 0435 03/04/18 0438 03/05/18 0500  WBC 32.1* 30.7*  --  35.1* 37.4*  --  34.9*  --  32.2* 33.4*  NEUTROABS 28.9* 26.4*  --   --   --   --   --   --   --   --   HGB 11.9* 11.2*   < > 10.3* 11.1*   < > 10.3* 12.9 10.2* 10.5*  HCT 39.2 38.0   < > 33.8* 38.8   < > 35.7* 38.0 35.8* 36.5  MCV 105.1* 105.8*  --  104.3* 110.2*  --  108.8*  --  109.1* 109.9*  PLT 299 275  --  290 325  --  315  --  317 338   < > = values in this interval not displayed.   Blood  Culture    Component Value Date/Time   SDES BLOOD A-LINE 03/01/2018 0659   SPECREQUEST  03/01/2018 0659    BOTTLES DRAWN AEROBIC AND ANAEROBIC Blood Culture adequate volume   CULT  03/01/2018 0659    NO GROWTH 3 DAYS Performed at [facility], [place] [address], [place], [place] [address]    REPTSTATUS PENDING 03/01/2018 8453    Cardiac Enzymes: Recent Labs  Lab 03/01/18 0702 03/01/18 1515 03/01/18 1850  TROPONINI 0.95* 0.75* 0.63*   CBG: Recent Labs  Lab 03/04/18 0747 03/04/18 1123 03/04/18 1950 03/04/18 2241 03/05/18 0750  GLUCAP 221* 278* 181* 243* 225*   Medications: . sodium chloride 10 mL/hr at 03/05/18 0800  . sodium chloride    .  ceFAZolin (ANCEF) IV Stopped (03/04/18 1308)  .  ceFAZolin (ANCEF) IV    .  heparin 1,300 Units/hr (03/05/18 0800)  . sodium chloride Stopped (03/04/18 2026)   . atorvastatin  80 mg Per Tube Daily  . calcitRIOL  0.25 mcg Oral Daily  . chlorhexidine gluconate (MEDLINE KIT)  15 mL Mouth Rinse BID  . Chlorhexidine Gluconate Cloth  6 each Topical Q0600  . diclofenac sodium  4 g Topical QID  . ferric citrate  420 mg Oral TID WC  . insulin aspart  0-9 Units Subcutaneous TID AC & HS  . insulin glargine  15 Units Subcutaneous Daily  . lactobacillus acidophilus  2 tablet Oral TID  . multivitamin  1 tablet Oral QHS  . pantoprazole  40 mg Oral Daily    Dialysis Orders: [facility] 4h 400/800 109kg 2/2 bath LUE AVF Hep 8000 + 2000 prn Calcitriol: 1.32mg PO qHD  Assessment/Plan: 1. Cardiac arrest: ROSC after 6 min. On empiric IV hepfor poss PE.CT angio not done. Had VQ results P  Cause not clear. 2. VDRF/Acute hypoxic and hypercarbic RMD:[id number]/21/20, using flutter valve and Bipap QHS.? CO2 retention causing arrest 3. Pulm infiltrates:S/p Osteomyelitis R 2nd/3rd toes: admit diagnosis, underwent 2nd/3rd ray amputation 2/19 - wound vac in place. On Vanc/Ceftriaxone.  4. ESRD:S/p extra HD 2/23 for volume - now back to  MWF schedule, next 2/26. 5. Hypertension/volume: BP's OK, under dry wt. BP dropped significantly with HD - follow for now. If recurs, may need midodrine pre-HD. 6. Anemiaof CKD: Hgb 10.5 - has yet to require ESA, may need to start if Hgb drops lower. 7. Secondary Hyperparathyroidism: CorrCa/Phos ok. Continue Auryxia as binder and VDRA 8. Nutrition: Alb low (2.2) - adding supplements. 9.  T2DM: Insulin per primary team 10.  AMS: Bad on 2/24 - much improved today. 152 ^ LFTs: New on 2/20 - repeat labs tomorrow, eval per primary.  KVeneta Penton PA-C 03/05/2018, 9:27 AM  [facility] Pager: [phone] have seen and examined this patient and agree with the plan of care seen, eval, examined, discussed., changes made .  JJeneen RinksDeterding 03/05/2018, 10:31 AM

## 2018-03-05 NOTE — Evaluation (Signed)
Occupational Therapy Evaluation Patient Details Name: Rachel Chaney MRN: [id number] DOB: Oct 10, 1960 Today's Date: 03/05/2018    History of Present Illness 58 year old female who admitted for crush injury of foot. 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 Wound culture grew S. Aureus with sensitivities pending. During dialysis, patient was found unresponsive. CPR was performed, she was intubated 2/20 and transferred to ICU. Presumed PE (IV team noted thrombus in RUE cephalic vein) Extubated 07/17/60 PMH: ESRD on HD, HTN, DM2, GERD   Clinical Impression   PTA patient independent with ADLs, mobility. Admitted for above and limited by problem list below, including impaired cognition, impaired vision, decreased activity tolerance, impaired balance, generalized weakness, and NWB R LE.  Patient requires min assist for UB ADLs, supervision for grooming, max - total assist for LB ADls, and max +2 assist for lateral scoot transfers.  Poor adherence to R LE NWB precautions when attempting to stand, able to follow simple 1 step commands but limited by attention to task, problem solving, comprehension, and sequencing. Further visual assessment recommended, limited by cognition today.  Will follow while admitted and recommend SNF rehab at dc in order to optimize independence and safety with ADLs.     Follow Up Recommendations  SNF;Supervision/Assistance - 24 hour    Equipment Recommendations  Other (comment)(TBD at next venue of care)    Recommendations for Other Services Speech consult     Precautions / Restrictions Precautions Precautions: Fall Precaution Comments: Rt femoral central line - cleared by MD to mobilize OOB to chair, wound  vac Restrictions Weight Bearing Restrictions: Yes RLE Weight Bearing: Non weight bearing      Mobility Bed Mobility Overal bed mobility: Needs Assistance Bed Mobility: Supine to Sit     Supine to sit: Mod assist;+2 for  safety/equipment     General bed mobility comments: pt initiated and brought LEs off EOB but then required verbal cues to continue transfer of trunk elevation to complete sitting EOB, modAx2 to complete transfer and sit squarely on the EOB  Transfers Overall transfer level: Needs assistance Equipment used: (2 person lift with pad) Transfers: Lateral/Scoot Transfers          Lateral/Scoot Transfers: Max assist;+2 physical assistance;+2 safety/equipment General transfer comment: attemptedx3 to complete sit to stand however pt uanble to maintain R LE NWB despite max verbal cues. Pt then complete lateral scoot transfer to drop arm chair toward the Left. it took 4 lateral scoots, pt increased effort and ability to assist with transfer with each scoot    Balance Overall balance assessment: Needs assistance Sitting-balance support: Bilateral upper extremity supported;Feet supported Sitting balance-Leahy Scale: Poor Sitting balance - Comments: pt dependent on bilat UEs to support self EOB                                   ADL either performed or assessed with clinical judgement   ADL Overall ADL's : Needs assistance/impaired     Grooming: Oral care;Supervision/safety;Sitting   Upper Body Bathing: Set up;Sitting   Lower Body Bathing: Maximal assistance;Sitting/lateral leans;Bed level;+2 for physical assistance   Upper Body Dressing : Minimal assistance;Sitting   Lower Body Dressing: Maximal assistance;+2 for physical assistance;+2 for safety/equipment;Sitting/lateral leans;Bed level   Toilet Transfer: Maximal assistance;+2 for physical assistance;+2 for safety/equipment Toilet Transfer Details (indicate cue type and reason): lateral scoot simulated to recliner  Functional mobility during ADLs: Maximal assistance;+2 for physical assistance;+2 for safety/equipment General ADL Comments: increased time and effort, min verbal cueing for sequencing and problem  sovling; limited by cognition, vision and decreased activity tolerance     Vision Baseline Vision/History: Wears glasses Wears Glasses: Reading only Patient Visual Report: No change from baseline Vision Assessment?: Vision impaired- to be further tested in functional context Additional Comments: patient unable to attend to and follow visual assessment, gross quadrant testing with fair accuracy but increased head movement to discern answer      Perception     Praxis      Pertinent Vitals/Pain Pain Assessment: Faces Faces Pain Scale: Hurts little more Pain Location: chest from CPR     Hand Dominance     Extremity/Trunk Assessment Upper Extremity Assessment Upper Extremity Assessment: Generalized weakness(grossly 3-/5 B UEs)   Lower Extremity Assessment Lower Extremity Assessment: Defer to PT evaluation       Communication Communication Communication: No difficulties   Cognition Arousal/Alertness: Awake/alert Behavior During Therapy: Flat affect Overall Cognitive Status: Impaired/Different from baseline Area of Impairment: Orientation;Attention;Memory;Following commands;Awareness;Safety/judgement;Problem solving                 Orientation Level: (oriented ) Current Attention Level: Focused Memory: Decreased short-term memory;Decreased recall of precautions(unaware of R LE NWB ) Following Commands: Follows one step commands inconsistently;Follows one step commands with increased time Safety/Judgement: Decreased awareness of deficits Awareness: Emergent Problem Solving: Slow processing;Difficulty sequencing;Requires verbal cues;Requires tactile cues General Comments: pt follow 1 step commands fairly consistently but unable to follow multistep commands, easily distracted, poor problem solving    General Comments  PT with wound vac to R LE, on 4L HFNC, VSS    Exercises     Shoulder Instructions      Home Living Family/patient expects to be discharged to::  Private residence Living Arrangements: Spouse/significant other Available Help at Discharge: Family Type of Home: House Home Access: Stairs to enter Technical brewer of Steps: 3 Entrance Stairs-Rails: Right Home Layout: One level     Bathroom Shower/Tub: Tub/shower unit;Curtain   Biochemist, clinical: Standard     Home Equipment: Environmental consultant - 2 wheels          Prior Functioning/Environment Level of Independence: Independent                 OT Problem List: Decreased strength;Decreased activity tolerance;Impaired balance (sitting and/or standing);Impaired vision/perception;Decreased cognition;Decreased safety awareness;Decreased knowledge of use of DME or AE;Decreased knowledge of precautions;Pain      OT Treatment/Interventions: Self-care/ADL training;Therapeutic exercise;DME and/or AE instruction;Therapeutic activities;Cognitive remediation/compensation;Visual/perceptual remediation/compensation;Patient/family education;Balance training    OT Goals(Current goals can be found in the care plan section) Acute Rehab OT Goals Patient Stated Goal: none stated OT Goal Formulation: With patient Time For Goal Achievement: 03/19/18 Potential to Achieve Goals: Good  OT Frequency: Min 2X/week   Barriers to D/C:            Co-evaluation PT/OT/SLP Co-Evaluation/Treatment: Yes Reason for Co-Treatment: Complexity of the patient's impairments (multi-system involvement) PT goals addressed during session: Mobility/safety with mobility OT goals addressed during session: ADL's and self-care;Other (comment)(mobility)      AM-PAC OT "6 Clicks" Daily Activity     Outcome Measure Help from another person eating meals?: A Little Help from another person taking care of personal grooming?: A Little Help from another person toileting, which includes using toliet, bedpan, or urinal?: Total Help from another person bathing (including washing, rinsing, drying)?: A Lot Help from another  person  to put on and taking off regular upper body clothing?: A Little Help from another person to put on and taking off regular lower body clothing?: Total 6 Click Score: 13   End of Session Equipment Utilized During Treatment: Gait belt;Oxygen Nurse Communication: Mobility status  Activity Tolerance: Patient tolerated treatment well Patient left: in chair;with call bell/phone within reach;with chair alarm set;with family/visitor present;with nursing/sitter in room  OT Visit Diagnosis: Other abnormalities of gait and mobility (R26.89);Muscle weakness (generalized) (M62.81);Pain;Other symptoms and signs involving cognitive function Pain - part of body: (chest)                Time: 9432-0037 OT Time Calculation (min): 33 min Charges:  OT General Charges $OT Visit: 1 Visit OT Evaluation $OT Eval Moderate Complexity: [place], OT Acute Rehabilitation Services Pager [phone] Office [phone]    Delight Stare 03/05/2018, 1:18 PM

## 2018-03-06 ENCOUNTER — Inpatient Hospital Stay (HOSPITAL_COMMUNITY): Payer: Medicare Other

## 2018-03-06 DIAGNOSIS — J9690 Respiratory failure, unspecified, unspecified whether with hypoxia or hypercapnia: Secondary | ICD-10-CM

## 2018-03-06 LAB — POCT I-STAT 7, (LYTES, BLD GAS, ICA,H+H)
Acid-base deficit: 4 mmol/L — ABNORMAL HIGH (ref 0.0–2.0)
Acid-base deficit: 1 mmol/L (ref 0.0–2.0)
Bicarbonate: 24.1 mmol/L (ref 20.0–28.0)
Bicarbonate: 26.3 mmol/L (ref 20.0–28.0)
Calcium, Ion: 1.25 mmol/L (ref 1.15–1.40)
Calcium, Ion: 1.3 mmol/L (ref 1.15–1.40)
HCT: 35 % — ABNORMAL LOW (ref 36.0–46.0)
HCT: 34 % — ABNORMAL LOW (ref 36.0–46.0)
Hemoglobin: 11.9 g/dL — ABNORMAL LOW (ref 12.0–15.0)
Hemoglobin: 11.6 g/dL — ABNORMAL LOW (ref 12.0–15.0)
O2 Saturation: 94 %
O2 Saturation: 98 %
Potassium: 3.7 mmol/L (ref 3.5–5.1)
Potassium: 3.5 mmol/L (ref 3.5–5.1)
Sodium: 133 mmol/L — ABNORMAL LOW (ref 135–145)
Sodium: 134 mmol/L — ABNORMAL LOW (ref 135–145)
TCO2: 26 mmol/L (ref 22–32)
TCO2: 28 mmol/L (ref 22–32)
pCO2 arterial: 54 mmHg — ABNORMAL HIGH (ref 32.0–48.0)
pCO2 arterial: 57 mmHg — ABNORMAL HIGH (ref 32.0–48.0)
pH, Arterial: 7.257 — ABNORMAL LOW (ref 7.350–7.450)
pH, Arterial: 7.272 — ABNORMAL LOW (ref 7.350–7.450)
pO2, Arterial: 82 mmHg — ABNORMAL LOW (ref 83.0–108.0)
pO2, Arterial: 116 mmHg — ABNORMAL HIGH (ref 83.0–108.0)

## 2018-03-06 LAB — GLUCOSE, CAPILLARY
Glucose-Capillary: 256 mg/dL — ABNORMAL HIGH (ref 70–99)
Glucose-Capillary: 306 mg/dL — ABNORMAL HIGH (ref 70–99)
Glucose-Capillary: 206 mg/dL — ABNORMAL HIGH (ref 70–99)
Glucose-Capillary: 199 mg/dL — ABNORMAL HIGH (ref 70–99)

## 2018-03-06 LAB — CULTURE, BLOOD (ROUTINE X 2)
Culture: NO GROWTH
Culture: NO GROWTH
Special Requests: ADEQUATE

## 2018-03-06 LAB — RENAL FUNCTION PANEL
Albumin: 2.2 g/dL — ABNORMAL LOW (ref 3.5–5.0)
Anion gap: 13 (ref 5–15)
BUN: 41 mg/dL — ABNORMAL HIGH (ref 6–20)
CO2: 24 mmol/L (ref 22–32)
Calcium: 8.8 mg/dL — ABNORMAL LOW (ref 8.9–10.3)
Chloride: 96 mmol/L — ABNORMAL LOW (ref 98–111)
Creatinine, Ser: 5.78 mg/dL — ABNORMAL HIGH (ref 0.44–1.00)
GFR calc Af Amer: 9 mL/min — ABNORMAL LOW (ref >60)
GFR calc non Af Amer: 7 mL/min — ABNORMAL LOW (ref >60)
Glucose, Bld: 260 mg/dL — ABNORMAL HIGH (ref 70–99)
Phosphorus: 4.3 mg/dL (ref 2.5–4.6)
Potassium: 3.9 mmol/L (ref 3.5–5.1)
Sodium: 133 mmol/L — ABNORMAL LOW (ref 135–145)

## 2018-03-06 LAB — CBC
HCT: 37.9 % (ref 36.0–46.0)
Hemoglobin: 10.8 g/dL — ABNORMAL LOW (ref 12.0–15.0)
MCH: 31.1 pg (ref 26.0–34.0)
MCHC: 28.5 g/dL — ABNORMAL LOW (ref 30.0–36.0)
MCV: 109.2 fL — ABNORMAL HIGH (ref 80.0–100.0)
Platelets: 307 10*3/uL (ref 150–400)
RBC: 3.47 MIL/uL — ABNORMAL LOW (ref 3.87–5.11)
RDW: 15.2 % (ref 11.5–15.5)
WBC: 31.8 10*3/uL — ABNORMAL HIGH (ref 4.0–10.5)
nRBC: 1.1 % — ABNORMAL HIGH (ref 0.0–0.2)

## 2018-03-06 LAB — VITAMIN B12: Vitamin B-12: 1356 pg/mL — ABNORMAL HIGH (ref 180–914)

## 2018-03-06 LAB — TROPONIN I: Troponin I: 0.13 ng/mL (ref <0.03)

## 2018-03-06 LAB — FOLATE: Folate: 33.4 ng/mL (ref >5.9)

## 2018-03-06 LAB — PROTIME-INR
INR: 1.2 (ref 0.8–1.2)
Prothrombin Time: 14.8 seconds (ref 11.4–15.2)

## 2018-03-06 LAB — CORTISOL: Cortisol, Plasma: 26.6 ug/dL

## 2018-03-06 LAB — TSH: TSH: 0.874 u[IU]/mL (ref 0.350–4.500)

## 2018-03-06 LAB — HEPARIN LEVEL (UNFRACTIONATED): Heparin Unfractionated: 0.42 IU/mL (ref 0.30–0.70)

## 2018-03-06 MED ORDER — SODIUM CHLORIDE 0.9 % IV SOLN: 1.0000 g | INTRAVENOUS | Status: DC

## 2018-03-06 MED ORDER — INSULIN ASPART 100 UNIT/ML ~~LOC~~ SOLN
0.0000 [IU] | Freq: Three times a day (TID) | SUBCUTANEOUS | Status: DC
  Administered 2018-03-06: 7 [IU] via SUBCUTANEOUS
  Administered 2018-03-06: 15 [IU] via SUBCUTANEOUS
  Administered 2018-03-07: 7 [IU] via SUBCUTANEOUS
  Administered 2018-03-07: 4 [IU] via SUBCUTANEOUS
  Administered 2018-03-07: 11 [IU] via SUBCUTANEOUS
  Administered 2018-03-08 (×2): 15 [IU] via SUBCUTANEOUS
  Administered 2018-03-08: 7 [IU] via SUBCUTANEOUS
  Administered 2018-03-09 – 2018-03-10 (×2): 4 [IU] via SUBCUTANEOUS
  Administered 2018-03-10: 7 [IU] via SUBCUTANEOUS
  Administered 2018-03-10 – 2018-03-12 (×4): 4 [IU] via SUBCUTANEOUS
  Administered 2018-03-12: 11 [IU] via SUBCUTANEOUS
  Administered 2018-03-12: 3 [IU] via SUBCUTANEOUS
  Administered 2018-03-13: 7 [IU] via SUBCUTANEOUS
  Administered 2018-03-14 (×2): 11 [IU] via SUBCUTANEOUS
  Administered 2018-03-14: 4 [IU] via SUBCUTANEOUS

## 2018-03-06 MED ORDER — MIDODRINE HCL 5 MG PO TABS
10.0000 mg | ORAL_TABLET | Freq: Three times a day (TID) | ORAL | Status: DC
  Administered 2018-03-06 – 2018-03-14 (×26): 10 mg via ORAL
  Filled 2018-03-06 (×26): qty 2

## 2018-03-06 MED ORDER — MIDODRINE HCL 5 MG PO TABS
10.0000 mg | ORAL_TABLET | Freq: Once | ORAL | Status: AC
  Administered 2018-03-06: 10 mg via ORAL

## 2018-03-06 MED ORDER — APIXABAN 2.5 MG PO TABS: 2.5000 mg | ORAL_TABLET | Freq: Two times a day (BID) | ORAL | Status: DC

## 2018-03-06 MED ORDER — SODIUM CHLORIDE 0.9 % IV SOLN
1.0000 g | INTRAVENOUS | Status: AC
  Administered 2018-03-06 – 2018-03-12 (×6): 1 g via INTRAVENOUS
  Filled 2018-03-06 (×7): qty 1

## 2018-03-06 MED ORDER — APIXABAN 5 MG PO TABS
5.0000 mg | ORAL_TABLET | Freq: Two times a day (BID) | ORAL | Status: DC
  Administered 2018-03-06 – 2018-03-07 (×3): 5 mg via ORAL
  Filled 2018-03-06 (×3): qty 1

## 2018-03-06 MED ORDER — CHLORHEXIDINE GLUCONATE CLOTH 2 % EX PADS
6.0000 | MEDICATED_PAD | Freq: Every day | CUTANEOUS | Status: DC
  Administered 2018-03-06 – 2018-03-10 (×4): 6 via TOPICAL

## 2018-03-06 MED ORDER — SODIUM CHLORIDE 0.9 % IV SOLN
2.0000 g | INTRAVENOUS | Status: DC
  Filled 2018-03-06: qty 2

## 2018-03-06 NOTE — Progress Notes (Addendum)
Family Medicine Teaching Service Daily Progress Note Intern Pager: [phone]  Patient name: Rachel Chaney Medical record number: [id number] Date of birth: Nov 22, 1960 Age: 58 y.o. Gender: female  Primary Care Provider: Bufford Lope, DO Consultants: Nephrology, Orthopedics Code Status: Full code  Pt Overview and Major Events to Date:  2/17 > admit 2/19 > Toe amputation on right foot 2/20 > Cardiac arrest, intubation, transferred to ICU 2/20 > started on heparin gtt empirically for presumed PE (IV team noted thrombus in RUE cephalic vein) 8/78: Extubated 2/22: Nursing staff reporting more lethargic, had been transferred to medicine service, new hypercarbic respiratory failure.  Placed on BiPAP 2/23: Remains off blood pressure medications for several days now in spite of what it says on MAR.  Hemodynamically stable.  Much more awake.  Suspect episode on 2/23 primarily due to hypercarbia.  Assessment and Plan: Rachel Chaney a 58 y.o.femalepresenting with painful 2nd toe of R foot. PMH is significant forESRD on HD MWF,T2DM,HTN, GERD, CHF, and dyslipidemia.  #Chronic HTN, with acute hypotension Currently hypotensive with AMS, had complained of CP for past few days that was attributed to musculoskeletal pain from CPR. BP with MAP in the 50s this AM. 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 Trop 0.13, decreased from 0.63 five days ago. Possible etiologies include suspected PE, but patient is receiving treatment. Possible that she threw a second clot and that is causing worsening hypotension and oxygen requirement. Hypotension could also be due to volume depletion although that is less likely given last dialysis was 2 days ago, she is eating and drinking, and increased peripheral edema on exam. Hypoalbuminemia with intravascular depletion is another possible etiology. CHF is possible etiology but recent Echo on 2/21 after cardiac arrest showed EF > 65% with increased LV wall  thickness and impaired relaxation. Cannot exclude that presentation is due to sepsis from underlying infection. Patient has been on IV ancef but has not been getting gram negative or pseudomonal coverage. Ortho assessed her surgical site yesterday and do not believe it is a source of infection. Plan to broaden coverage of antibiotics today.  - Consult nephrology, appreciate recs - Holding home hydral, carvedilol - Start midodrine 10 mg tid - Dialysis cancelled today - F/U cortisol, TSH, echo, BCx, UA - Start Cefepime 2g IV MWF, for gram neg and pseudomonal coverage - If continued suspicion for PE as cause of decompensation, will further discuss risks and benefits of CTA with Nephrology  - Consider soft tissue ultrasound to further evaluate leg abscess as potential source of infection  #Acute hypoxic and hypercarbic respiratory failure Likely multifactorial with likely underlying OSA and OHS with atelectasis, volume excess vs. pneumonitis and presumed PE. ABG today with pH 7.257, pCO2 54.0 and pO2 82.0. Have been taking off fluid with dialysis but patient's blood pressure is tenuous. Need to consider that interstitial prominence may not be all fluid and could be due to aspiration pneumonitis. Currently treating for possible PE as below. Patient with continued elevated WBC and oxygen requirement.  - Wean O2, titrate for sat 88-92% - CPAP at night - Trial on BiPap, repeat ABG - Would benefit from mobilization, but pt should be nonweightbearing on the operative foot - Sleep study as outpatient   #Presumed pulmonary embolism/ s/p cardiac arrest 2/20 S/P cardiac arrest 2/20 with unclear etiology. Thought to be PE given patient was post-op and IV team noted thrombus in RUE cephalic vein. CTA chest not performed given ESRD, was discussed with nephrology. V/Q  scan would likely be equivocal in the setting of marked intersitial prominence on CXR, would not change management. Currently on heparin gtt for  presumed PE. Other possible etiologies for cardiac arrest include sepsis from infection, and arrhythmia. Discussed anticoagulation with warfarin vs. DOAC for ESRD with Nephrology yesterday. There are limited trials to evaluate use of DOACs in ESRD or for PE rather than a-fib. For ease of patient use and given uncertainty with PE diagnosis, would favor anticoagulation with DOAC rather than warfarin. - Discontinue heparin gtt - Plan to transition to apixaban 5 mg bid for treatment dose - Telemetry - Full code status  #Right second toe fractureOPEN, soft tissue infection, concerning for osteomyelitis: Patient w/ R toe open fx, soft tissue infection and osteo In the setting of uncontrolled diabetes now s/p amputation of 2nd and 3rd right toe on 2/19. Was found to have large purulent abscess extending to the midfoot. Cultures obtained x2, positive for S. Aureus, pan-sensitive. Patient has remained afebrile but white count remains elevated with WBC in the 30s. Evaluated by ortho PA yesterday evening who did not think wound was concerning for infection. Will f/u on Dr. Jess Barters recommendations today. Wound vac due to be taken off one week post-surgery (today). - S/p amputation 2nd and 3rd R toes 2/19 - Start IV cefepime renal dosing - F/U ortho recs - PT/OT - Would benefit from mobilization, but pt should be nonweightbearing on the operative foot  #Altered mental status/ Acute metabolic encephalopathy Likely due to hypoperfusion from low MAP. See problem above. Hypercarbia from respiratory failure is likely contributing. Repeat ABG today with acidosis and hypercarbia. Other possible etiologies include delirium from medications, hospitalization, infection, or B12 deficiency given patient has macrocytic anemia.  - Manage blood pressure as above - Trial on Bipap, repeat ABG - F/U b12, folate --Head CT --Broaden antibiotic coverage   #Acute musculoskeletal chest pain From CPR on 2/20. Pain improving. -  Continue voltaren gel - Tylenol q6h PRN - Avoid PO NSAIDs  #End-stage renal disease on HD Receives hemodialysis MWF at [facility]. Cancelled session today given hypotension. - Dialysis per nephro - Nephro following, appreciate recs - Renal/carb diet   #T2DM:HbA1c 11.4% 02/26/18. 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 Home regimen is 50 units Levemir twice daily. Unclear if she is compliant with this at home given good response to insulin regimen here.  - Continue Lantus 15 u  - Start resistant SSI - CBG monitoring 4 times daily - Consider continuing Lantus for once daily dosing or oral sulfonylurea at discharge  #Diarrhea Recently discontinued magnesium citrate, colace and ducolax.  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 Was receiving treatment as outpatient.  - Nephro following, appreciate recs - Continue home calcitriol and auryxia  #HFpEF: Last echo 04/12/2015 with Grade 1 diastolic dysfunction, EF 89-38%. Patient with 1+ peripheral edema on exam but no crackles. Does not take diuretics at home. - Holding home carvedilol as above  #Hypoalbuminemia Albumin 2.6 on admission. No evidence of liver disease. Urinalysis from 2017 with > 300 protein indicating that there is likely a nephrotic syndrome component to her hypoalbuminemia. Protein calorie malnutrition given poor diet could also be contributing. S/p albumin 12.5 g in the ED. - Pro-Stat and renal MVI supplementation fell off when switching back from ICU, consider restarting - Nephro following, appreciate recs  #Anemia of CKD:  Hgb 10.8 on admission, stable (baseline in the 12's as outpatient). Per nephro, suspect some blood loss with  injury. Has not yet required ESA. MCV elevated to 109 suggesting B12 or folate deficiency. - Daily CBCs - F/U B12, folate  #Hyperlipidemia: Most recent lipid panel April 2019 showing low cholesterol at 94, low HDL at 32, and other values  within normal limits. - Continue atorvastatin 80 mg  #GERD: Patient asymptomatic at this time, usually takes Tums at home. -Monitor for acid reflux symptoms.  FEN/GI: Carb/renal diet PPx: heparin  Disposition: Step-down  Subjective:  Patient complained of CP this morning, BP 90/50 with MAPs in the 50s, MD was called, troponin and EKG were ordered.  On exam, patient was unable to respond to questions. She was able to follow commands and squeeze hands.   Objective: Temp:  [97.7 F (36.5 C)-98.5 F (36.9 C)] 97.7 F (36.5 C) (02/26 0400) Pulse Rate:  [73-92] 79 (02/26 0815) Resp:  [13-30] 19 (02/26 0815) BP: (73-183)/(43-87) 93/48 (02/26 0815) SpO2:  [92 %-100 %] 97 % (02/26 0815)   Physical Exam: General: lying in bed, unable to maintain attention, on 10 L [place] Cardiovascular: RRR Respiratory: normal work of breathing on 10 L [place], unable to assess lungs 2/2 patient factors Abdomen: soft, nontender Extremities: 1+ pitting edema Neuro: not responding to questions, not able to pay attention, squeezing hands equally on both sides, no focal deficits  Laboratory: Recent Labs  Lab 03/04/18 0438 03/05/18 0500 03/06/18 0845  WBC 32.2* 33.4* 31.8*  HGB 10.2* 10.5* 10.8*  HCT 35.8* 36.5 37.9  PLT 317 338 307   Recent Labs  Lab 02/28/18 2232  03/04/18 0438 03/05/18 0500 03/06/18 0339  NA 136   < > 135 135 133*  K 4.1   < > 3.7 3.7 3.9  CL 96*   < > 95* 98 96*  CO2 27   < > 23 22 24   BUN 20   < > 27* 27* 41*  CREATININE 5.28*   < > 4.84* 3.99* 5.78*  CALCIUM 8.1*   < > 8.3* 8.7* 8.8*  PROT 6.6  --   --   --   --   BILITOT 1.4*  --   --   --   --   ALKPHOS 173*  --   --   --   --   ALT 96*  --   --   --   --   AST 145*  --   --   --   --   GLUCOSE 217*   < > 214* 251* 260*   < > = values in this interval not displayed.      Imaging/Diagnostic Tests: Dg Foot Complete Right  Result Date: 02/25/2018 CLINICAL DATA:  Toe injury. EXAM: RIGHT FOOT COMPLETE - 3+ VIEW  COMPARISON:  Radiographs of May 14, 2012. FINDINGS: Moderately displaced fracture is seen involving the proximal portion of the first proximal phalanx with intra-articular extension with some callus formation suggesting subacute fracture. Minimally displaced fracture is seen involving the proximal portion of the second distal phalanx. Soft tissue irregularity or wound is seen involving the distal portion of the second toe. Mild posterior calcaneal spurring is noted. IMPRESSION: Probable subacute moderately displaced fracture is seen involving the first proximal phalanx with intra-articular extension. Probable minimally displaced acute fracture is seen involving proximal portion of the second distal phalanx. Overlying soft tissue irregularity or wound is seen involving the distal soft tissues of the second toe. Electronically Signed   By: Marijo Conception, M.D.   On: 02/25/2018 12:17    I  have seen and evaluated the patient with Medical Student [employer]. I am in agreement with the note above in its revised form. My additions are in blue.  Marjie Skiff, MD Family Medicine, PGY-3   Mitchell Heir, Medical Student 03/06/2018, 9:00 AM [place] Intern pager: [phone], text pages welcome

## 2018-03-06 NOTE — Progress Notes (Signed)
Was paged by Merrilee Seashore, RN, after patient experiencing low Bps with MAP 59 while sleeping. Dr. Tammi Klippel and I visited the patient in her room where she was resting peacefully with BiPAP in place. She was easily aroused from sleep, alert and oriented x 3, but easily fell back to sleep. Blood pressure upon awakening improved to 102/47 (map 63). Patient received Midodrine 10mg  x 2 during the day (0900 and 1821), so we requested a 3rd dose of Midodrine be given at this time (2226).   The family med team can be reached at [phone] with further concerns. Thank you, Merrilee Seashore!  Milus Banister, [place], PGY-1 03/06/2018 10:52 PM

## 2018-03-06 NOTE — Progress Notes (Signed)
Pt placed on bipap with 5L bled in per MD request

## 2018-03-06 NOTE — Progress Notes (Signed)
Patient ID: Early Chars, female   DOB: 08/07/1960, 58 y.o.   MRN: [id number] Patient is status post second and third ray amputations right foot.  The incision is well approximated mild ischemic changes in the plantar aspect some mild maceration distally with a little bit of bloody drainage.  There is no purulence no abscess no signs of infection in the foot.  Orders written for dry dressing change daily.

## 2018-03-06 NOTE — Progress Notes (Signed)
   03/06/18 2213  Provider Notification  Provider Name/Title Family Medicine On Call Provider  Date Provider Notified 03/06/18  Time Provider Notified 2205  Notification Type Page  Notification Reason Other (Comment) (Hypotension)  Response See new orders;Other (Comment) (Give a third dose of midodrine)  Date of Provider Response 03/06/18  Time of Provider Response 2220

## 2018-03-06 NOTE — Progress Notes (Addendum)
ANTICOAGULATION CONSULT NOTE - Follow Up Consult  Pharmacy Consult for IV Heparin Indication: DVT  No Known Allergies  Patient Measurements: Height: 5\' 4"  (162.6 cm) Weight: (unable to obtain, error message) IBW/kg (Calculated) : 54.7 Heparin Dosing Weight: 80 kg  Vital Signs: Temp: 98.3 F (36.8 C) (02/26 1200) Temp Source: Axillary (02/26 1200) BP: 85/55 (02/26 1200) Pulse Rate: 72 (02/26 1200)  Labs: Recent Labs    03/04/18 0438  03/05/18 0500 03/05/18 1450 03/06/18 0339 03/06/18 0758 03/06/18 0845 03/06/18 1148  HGB 10.2*  --  10.5*  --   --   --  10.8* 11.9*  HCT 35.8*  --  36.5  --   --   --  37.9 35.0*  PLT 317  --  338  --   --   --  307  --   LABPROT 15.8*  --  17.5*  --  14.8  --   --   --   INR 1.27  --  1.5*  --  1.2  --   --   --   HEPARINUNFRC 0.80*   < > 1.32* 0.40 0.42  --   --   --   CREATININE 4.84*  --  3.99*  --  5.78*  --   --   --   TROPONINI  --   --   --   --   --  0.13*  --   --    < > = values in this interval not displayed.   Estimated Creatinine Clearance: 12.3 mL/min (A) (by C-G formula based on SCr of 5.78 mg/dL (H)).  Assessment: 58 YO F with new DVT and ?PE, continues on IV heparin. Heparin level this morning reported to be therapeutic at 0.42. No bleeding reported. Hgb at 11.9 and platelets 307, stable.  Goal of Therapy:  Heparin level 0.3-0.7 units/ml Monitor platelets by anticoagulation protocol: Yes   Plan:  Continue heparin gtt to 1300 units/hr Daily heparin level and CBC Monitor for s/sx of bleeding  Justice Deeds, PharmD Candidate

## 2018-03-06 NOTE — Progress Notes (Signed)
Inpatient Diabetes Program Recommendations  AACE/ADA: New Consensus Statement on Inpatient Glycemic Control (2015)  Target Ranges:  Prepandial:   less than 140 mg/dL      Peak postprandial:   less than 180 mg/dL (1-2 hours)      Critically ill patients:  140 - 180 mg/dL   Lab Results  Component Value Date   GLUCAP 256 (H) 03/06/2018   HGBA1C 11.4 (H) 02/26/2018    Review of Glycemic Control  Blood sugars continue > 180 mg/dL. Needs insulin adjustment.  Inpatient Diabetes Program   Increase Lantus to 15 units bid If post-prandials > 180 mg/dL, add Novolog 4 units tidwc  Continue to follow.   Thank you. Lorenda Peck, RD, LDN, CDE Inpatient Diabetes Coordinator [phone]

## 2018-03-06 NOTE — Social Work (Signed)
CSW acknowledging consult for SNF placement. Will follow for therapy recommendations. Pt not currently appropriate for placement.   Westley Hummer, MSW, [place] Work [phone]

## 2018-03-06 NOTE — Progress Notes (Signed)
Subjective: Interval History: has no complaint .  Objective: Vital signs in last 24 hours: Temp:  [97.7 F (36.5 C)-98.5 F (36.9 C)] 98.1 F (36.7 C) (02/26 0800) Pulse Rate:  [73-92] 79 (02/26 0900) Resp:  [13-30] 21 (02/26 0900) BP: (73-183)/(43-87) 85/47 (02/26 0900) SpO2:  [92 %-100 %] 96 % (02/26 0900) Weight change:   Intake/Output from previous day: 02/25 0701 - 02/26 0700 In: 339.2 [I.V.:339.2] Out: -  Intake/Output this shift: Total I/O In: 13 [I.V.:13] Out: -   General appearance: alert, cooperative, no distress, morbidly obese and slowed mentation Resp: diminished breath sounds bilaterally and rales bibasilar Cardio: S1, S2 normal and systolic murmur: systolic ejection 2/6, crescendo and decrescendo at 2nd left intercostal space GI: obese, pos bs, soft,  Extremities: edema 1-2+ and TMA R, AVF LUA  Lab Results: Recent Labs    03/05/18 0500 03/06/18 0845  WBC 33.4* 31.8*  HGB 10.5* 10.8*  HCT 36.5 37.9  PLT 338 307   BMET:  Recent Labs    03/05/18 0500 03/06/18 0339  NA 135 133*  K 3.7 3.9  CL 98 96*  CO2 22 24  GLUCOSE 251* 260*  BUN 27* 41*  CREATININE 3.99* 5.78*  CALCIUM 8.7* 8.8*   No results for input(s): PTH in the last 72 hours. Iron Studies: No results for input(s): IRON, TIBC, TRANSFERRIN, FERRITIN in the last 72 hours.  Studies/Results: Dg Chest 2 View  Result Date: 03/05/2018 CLINICAL DATA:  Shortness of breath.  Congestion. EXAM: CHEST - 2 VIEW COMPARISON:  03/03/2018. FINDINGS: Cardiomegaly with bilateral pulmonary interstitial prominence. Interstitial prominence has progressed from prior exam. Findings suggest CHF. Pneumonitis could also present this fashion. No pleural effusion or pneumothorax. IMPRESSION: Cardiomegaly with scratch progressive bilateral pulmonary interstitial prominence suggesting progressive CHF. Pneumonitis can not be excluded. Electronically Signed   By: Marcello Moores  Register   On: 03/05/2018 12:02    I have  reviewed the patient's current medications.  Assessment/Plan: 1 ESRD for HD, vol xs but will be limited in removal with low bps 2 Low bps, discussed with primary, not clear etio.  Will use [place] check Hb, TSH, cortisol, 2-D. 3 Anemia stable follow 4 DM controlled 5 PVD 6 Obesity 7 Arrest etio still not clear, need to r/o PE but still not as likely as other P HD, esa, 2-D, cortisol, TSH, mido,     LOS: 9 days   Sinda Leedom 03/06/2018,9:20 AM

## 2018-03-07 ENCOUNTER — Other Ambulatory Visit (HOSPITAL_COMMUNITY): Payer: Medicare Other

## 2018-03-07 ENCOUNTER — Inpatient Hospital Stay (HOSPITAL_COMMUNITY): Payer: Medicare Other

## 2018-03-07 DIAGNOSIS — J9602 Acute respiratory failure with hypercapnia: Secondary | ICD-10-CM

## 2018-03-07 LAB — POCT I-STAT 7, (LYTES, BLD GAS, ICA,H+H)
Acid-base deficit: 2 mmol/L (ref 0.0–2.0)
Acid-base deficit: 3 mmol/L — ABNORMAL HIGH (ref 0.0–2.0)
Bicarbonate: 24.9 mmol/L (ref 20.0–28.0)
Bicarbonate: 25.1 mmol/L (ref 20.0–28.0)
Calcium, Ion: 1.19 mmol/L (ref 1.15–1.40)
Calcium, Ion: 1.27 mmol/L (ref 1.15–1.40)
HCT: 37 % (ref 36.0–46.0)
HCT: 38 % (ref 36.0–46.0)
Hemoglobin: 12.6 g/dL (ref 12.0–15.0)
Hemoglobin: 12.9 g/dL (ref 12.0–15.0)
O2 Saturation: 93 %
O2 Saturation: 95 %
Patient temperature: 97.6
Patient temperature: 98.2
Potassium: 3.1 mmol/L — ABNORMAL LOW (ref 3.5–5.1)
Potassium: 3.9 mmol/L (ref 3.5–5.1)
Sodium: 133 mmol/L — ABNORMAL LOW (ref 135–145)
Sodium: 134 mmol/L — ABNORMAL LOW (ref 135–145)
TCO2: 27 mmol/L (ref 22–32)
TCO2: 27 mmol/L (ref 22–32)
pCO2 arterial: 49.4 mmHg — ABNORMAL HIGH (ref 32.0–48.0)
pCO2 arterial: 55.8 mmHg — ABNORMAL HIGH (ref 32.0–48.0)
pH, Arterial: 7.257 — ABNORMAL LOW (ref 7.350–7.450)
pH, Arterial: 7.311 — ABNORMAL LOW (ref 7.350–7.450)
pO2, Arterial: 74 mmHg — ABNORMAL LOW (ref 83.0–108.0)
pO2, Arterial: 85 mmHg (ref 83.0–108.0)

## 2018-03-07 LAB — CBC
HCT: 38.2 % (ref 36.0–46.0)
Hemoglobin: 11.2 g/dL — ABNORMAL LOW (ref 12.0–15.0)
MCH: 31.8 pg (ref 26.0–34.0)
MCHC: 29.3 g/dL — ABNORMAL LOW (ref 30.0–36.0)
MCV: 108.5 fL — ABNORMAL HIGH (ref 80.0–100.0)
Platelets: 359 10*3/uL (ref 150–400)
RBC: 3.52 MIL/uL — ABNORMAL LOW (ref 3.87–5.11)
RDW: 15.5 % (ref 11.5–15.5)
WBC: 41.5 10*3/uL — ABNORMAL HIGH (ref 4.0–10.5)
nRBC: 1.6 % — ABNORMAL HIGH (ref 0.0–0.2)

## 2018-03-07 LAB — GLUCOSE, CAPILLARY
Glucose-Capillary: 281 mg/dL — ABNORMAL HIGH (ref 70–99)
Glucose-Capillary: 169 mg/dL — ABNORMAL HIGH (ref 70–99)
Glucose-Capillary: 201 mg/dL — ABNORMAL HIGH (ref 70–99)
Glucose-Capillary: 158 mg/dL — ABNORMAL HIGH (ref 70–99)

## 2018-03-07 LAB — RENAL FUNCTION PANEL
Albumin: 2.3 g/dL — ABNORMAL LOW (ref 3.5–5.0)
Anion gap: 16 — ABNORMAL HIGH (ref 5–15)
BUN: 59 mg/dL — ABNORMAL HIGH (ref 6–20)
CO2: 21 mmol/L — ABNORMAL LOW (ref 22–32)
Calcium: 9.1 mg/dL (ref 8.9–10.3)
Chloride: 96 mmol/L — ABNORMAL LOW (ref 98–111)
Creatinine, Ser: 7.64 mg/dL — ABNORMAL HIGH (ref 0.44–1.00)
GFR calc Af Amer: 6 mL/min — ABNORMAL LOW (ref >60)
GFR calc non Af Amer: 5 mL/min — ABNORMAL LOW (ref >60)
Glucose, Bld: 252 mg/dL — ABNORMAL HIGH (ref 70–99)
Phosphorus: 4.9 mg/dL — ABNORMAL HIGH (ref 2.5–4.6)
Potassium: 4 mmol/L (ref 3.5–5.1)
Sodium: 133 mmol/L — ABNORMAL LOW (ref 135–145)

## 2018-03-07 LAB — MAGNESIUM: Magnesium: 2.2 mg/dL (ref 1.7–2.4)

## 2018-03-07 MED ORDER — HEPARIN SODIUM (PORCINE) 1000 UNIT/ML DIALYSIS
20.0000 [IU]/kg | INTRAMUSCULAR | Status: DC | PRN
  Administered 2018-03-07: 2100 [IU] via INTRAVENOUS_CENTRAL

## 2018-03-07 MED ORDER — SODIUM CHLORIDE 0.9 % IV SOLN: 100.0000 mL | INTRAVENOUS | Status: DC | PRN

## 2018-03-07 MED ORDER — PHENYLEPHRINE HCL-NACL 10-0.9 MG/250ML-% IV SOLN: 25.0000 ug/min | INTRAVENOUS | Status: DC

## 2018-03-07 MED ORDER — POTASSIUM CHLORIDE CRYS ER 20 MEQ PO TBCR
40.0000 meq | EXTENDED_RELEASE_TABLET | Freq: Two times a day (BID) | ORAL | Status: AC
  Administered 2018-03-07 (×2): 40 meq via ORAL
  Filled 2018-03-07 (×2): qty 2

## 2018-03-07 MED ORDER — WHITE PETROLATUM EX OINT
TOPICAL_OINTMENT | CUTANEOUS | Status: AC
  Administered 2018-03-07: 09:00:00
  Filled 2018-03-07: qty 28.35

## 2018-03-07 MED ORDER — ALTEPLASE 2 MG IJ SOLR
2.0000 mg | Freq: Once | INTRAMUSCULAR | Status: DC | PRN
  Filled 2018-03-07: qty 2

## 2018-03-07 MED ORDER — SODIUM CHLORIDE 0.9 % IV SOLN: 250.0000 mL | INTRAVENOUS | Status: DC

## 2018-03-07 MED ORDER — HEPARIN SODIUM (PORCINE) 1000 UNIT/ML DIALYSIS
1000.0000 [IU] | INTRAMUSCULAR | Status: DC | PRN
  Filled 2018-03-07: qty 1

## 2018-03-07 MED ORDER — CHLORHEXIDINE GLUCONATE CLOTH 2 % EX PADS: 6.0000 | MEDICATED_PAD | Freq: Every day | CUTANEOUS | Status: DC

## 2018-03-07 MED ORDER — IOPAMIDOL (ISOVUE-370) INJECTION 76%
INTRAVENOUS | Status: AC
  Administered 2018-03-07: 75 mL
  Filled 2018-03-07: qty 100

## 2018-03-07 MED ORDER — ENSURE ENLIVE PO LIQD: 237.0000 mL | Freq: Two times a day (BID) | ORAL | Status: DC

## 2018-03-07 MED ORDER — FLUDROCORTISONE ACETATE 0.1 MG PO TABS
0.2000 mg | ORAL_TABLET | Freq: Every day | ORAL | Status: DC
  Administered 2018-03-07 – 2018-03-08 (×2): 0.2 mg via ORAL
  Filled 2018-03-07 (×3): qty 2

## 2018-03-07 MED ORDER — INSULIN GLARGINE 100 UNIT/ML ~~LOC~~ SOLN
25.0000 [IU] | Freq: Every day | SUBCUTANEOUS | Status: DC
  Administered 2018-03-08 – 2018-03-14 (×7): 25 [IU] via SUBCUTANEOUS
  Filled 2018-03-07 (×7): qty 0.25

## 2018-03-07 MED ORDER — SODIUM CHLORIDE 0.9 % IV BOLUS
500.0000 mL | Freq: Once | INTRAVENOUS | Status: AC
  Administered 2018-03-07: 500 mL via INTRAVENOUS

## 2018-03-07 NOTE — Progress Notes (Signed)
PT Cancellation Note  Patient Details Name: Rachel Chaney MRN: [id number] DOB: November 24, 1960   Cancelled Treatment:    Reason Eval/Treat Not Completed: Patient at procedure or test/unavailable. Pt currently receiving HD and unavailable    [name] B [name] 03/07/2018, 8:28 AM Elwyn Reach, PT Acute Rehabilitation Services Pager: [phone] Office: [phone]

## 2018-03-07 NOTE — Progress Notes (Signed)
Subjective: Interval History: has no complaint .  Objective: Vital signs in last 24 hours: Temp:  [86 F (30 C)-98.2 F (36.8 C)] 97.8 F (36.6 C) (02/27 0400) Pulse Rate:  [57-87] 76 (02/27 0739) Resp:  [10-32] 15 (02/27 0739) BP: (76-121)/(38-87) 105/50 (02/27 0700) SpO2:  [92 %-100 %] 99 % (02/27 0739) FiO2 (%):  [40 %-50 %] 40 % (02/27 0739) Weight change:   Intake/Output from previous day: 02/26 0701 - 02/27 0700 In: 368 [P.O.:120; I.V.:146.5; IV Piggyback:101.5] Out: -  Intake/Output this shift: No intake/output data recorded.  General appearance: cooperative, no distress, morbidly obese and on BIPAP Resp: diminished breath sounds bilaterally and rales bibasilar Cardio: S1, S2 normal and systolic murmur: systolic ejection 2/6, crescendo and decrescendo at 2nd left intercostal space GI: obese, pos bs, soft, liver down 6 cm Extremities: edema 2+ and Dressing R foot, AVF RUA  Lab Results: Recent Labs    03/06/18 0845  03/07/18 0007 03/07/18 0543  WBC 31.8*  --   --  41.5*  HGB 10.8*   < > 12.6 11.2*  HCT 37.9   < > 37.0 38.2  PLT 307  --   --  359   < > = values in this interval not displayed.   BMET:  Recent Labs    03/06/18 0339  03/07/18 0007 03/07/18 0543  NA 133*   < > 134* 133*  K 3.9   < > 3.9 4.0  CL 96*  --   --  96*  CO2 24  --   --  21*  GLUCOSE 260*  --   --  252*  BUN 41*  --   --  59*  CREATININE 5.78*  --   --  7.64*  CALCIUM 8.8*  --   --  9.1   < > = values in this interval not displayed.   No results for input(s): PTH in the last 72 hours. Iron Studies: No results for input(s): IRON, TIBC, TRANSFERRIN, FERRITIN in the last 72 hours.  Studies/Results: Dg Chest 2 View  Result Date: 03/05/2018 CLINICAL DATA:  Shortness of breath.  Congestion. EXAM: CHEST - 2 VIEW COMPARISON:  03/03/2018. FINDINGS: Cardiomegaly with bilateral pulmonary interstitial prominence. Interstitial prominence has progressed from prior exam. Findings suggest CHF.  Pneumonitis could also present this fashion. No pleural effusion or pneumothorax. IMPRESSION: Cardiomegaly with scratch progressive bilateral pulmonary interstitial prominence suggesting progressive CHF. Pneumonitis can not be excluded. Electronically Signed   By: Marcello Moores  Register   On: 03/05/2018 12:02   Ct Head Wo Contrast  Result Date: 03/06/2018 CLINICAL DATA:  Altered mental status EXAM: CT HEAD WITHOUT CONTRAST TECHNIQUE: Contiguous axial images were obtained from the base of the skull through the vertex without intravenous contrast. COMPARISON:  None. FINDINGS: Brain: There is no mass, hemorrhage or extra-axial collection. The size and configuration of the ventricles and extra-axial CSF spaces are normal. There is hypoattenuation of the white matter, most commonly indicating chronic small vessel disease. Hyperdensity along both frontal convexities is favored to be streak artifact from calvarium. Vascular: No abnormal hyperdensity of the major intracranial arteries or dural venous sinuses. No intracranial atherosclerosis. Skull: The visualized skull base, calvarium and extracranial soft tissues are normal. Sinuses/Orbits: Right maxillary retention cyst. The orbits are normal. IMPRESSION: 1. No acute intracranial abnormality. 2. Chronic small vessel disease. Electronically Signed   By: Ulyses Jarred M.D.   On: 03/06/2018 20:35   Dg Chest Port 1 View  Result Date: 03/07/2018 CLINICAL DATA:  Respiratory distress EXAM: PORTABLE CHEST 1 VIEW COMPARISON:  03/05/2018 FINDINGS: Shallow inspiration. Cardiac enlargement. No vascular congestion. Small bilateral pleural effusions with basilar atelectasis or infiltration. No pneumothorax. Tortuous aorta. IMPRESSION: Cardiac enlargement. Small bilateral pleural effusions with basilar atelectasis or infiltration. Electronically Signed   By: Lucienne Capers M.D.   On: 03/07/2018 02:41    I have reviewed the patient's current medications.  Assessment/Plan: 1  ESRD will try HD  Vol xs but bp still marginal 2 Low bps unexplained 3 Anemia stable 4 DM controlled 5 PAD post TMA 6 Arrest unexplained 7? PE no data to support. Needs eval 8CO2 retention P HD, esa, BIPAP, 2-D eval for effusion    LOS: 10 days   Jeneen Rinks Melynda Krzywicki 03/07/2018,7:41 AM

## 2018-03-07 NOTE — Progress Notes (Signed)
Rachel Chaney, MRN:  [id number], DOB:  [birth year], LOS: 24 ADMISSION DATE:  02/25/2018, CONSULTATION DATE:  02/28/18 REFERRING MD: Josephine Igo, MD CHIEF COMPLAINT:  S/p cardiac arrest  Brief History   58 year old female with ESRD who presented 2/17 for foot injury and admitted for amputation of 2nd and 3rd digit of right foot on 2/19. No post-op complications. Found unresponsive during dialysis on 2/20, CPR performed with ROSC after 6 minutes.  Some concern for PE and was on heparin gtt which was transitioned to eliquis as of 2/26.  She was extubated 2/21 and since has been intermittently on HFNC to BiPAP with variable lethargy and hypotension.  iHD canceled for 2/26 secondary to hypotension.  Was started on midodrine but still has ongoing soft pressures.  Given ongoing need for HFNC/ BiPAP, altered mental status, and hypotension, PCCM was reconsulted 2/27 am.   Past Medical History  ESRD on HD, HTN, DM2, GERD, diastolic HF, dyslipidemia   Significant Hospital Events   2/17 > admit 2/19 > Toe amputation on right foot 2/20 > Cardiac arrest, intubation, transferred to ICU 2/20 > started on heparin gtt empirically for presumed PE (IV team noted thrombus in RUE cephalic vein) 7/42: Extubated 2/22: Nursing staff reporting more lethargic, had been transferred to medicine service, new hypercarbic respiratory failure.  Placed on BiPAP 2/23: Remains off blood pressure medications for several days now in spite of what it says on MAR.  Hemodynamically stable.  Much more awake.  Suspect episode on 2/23 primarily due to hypercarbia. 2/24 PCCM off; iHD 2/25 intermittently on BiPAP 2/26 HD canceled 2/2 hypotension; intermittently on HFNC/ BiPAP  Consults:  PCCM Nephro  Procedures:  ETT 2/20 > 2/21 R fem CVL 2/20 > out R fem art line 2/20 > 2/20 (malfunctioned overnight) R radial art line 2/21 > 2/23  Significant Diagnostic Tests:  Right Foot XR 2/17 > displaced fracture of 1st and 2nd  proximal phalanx  ABI 2/19 > right and left ABI indicates noncompressible RLE, unable to obtain TBI due to movement. Echo 2/21 > left ventricle hyperdynamic, EF greater than 59% diastolic parameters consistent with diastolic dysfunction.  Right ventricular pressures are elevated consistent with volume overload as well as demonstrating diminished right ventricular systolic pressure right atrium was normal size LE duplex 2/20 > no DVT. UE duplex 2/21 > no DVTs.  Did have acute superficial vein thrombus in the right cephalic vein  Micro Data:  2/18 MRSA PCR >> staph positive 2/19 R foot Wound culture 2/19 > S. Aureus. Pan sensitive 2/21 BC x 2 >> neg 2/26 BC x 2 >>  Antimicrobials:  Vanc 2/17 >> 2/21 Ceftriaxone 2/17 >> 2/21 Ancef 2/17; 2/21 >> Metronidazole 2/21 >> 2/24 Cefepime 2/26 >>  Interim history/subjective:  On BiPAP and tolerating HD this am without complaints.  Objective   Blood pressure (!) 124/55, pulse 77, temperature 97.6 F (36.4 C), temperature source Oral, resp. rate 20, height 5\' 4"  (1.626 m), weight 95.5 kg, last menstrual period 09/18/2011, SpO2 97 %.    FiO2 (%):  [40 %-50 %] 40 %   Intake/Output Summary (Last 24 hours) at 03/07/2018 1110 Last data filed at 03/07/2018 0800 Gross per 24 hour  Intake 316.1 ml  Output -  Net 316.1 ml   Filed Weights   03/03/18 1330 03/03/18 1734 03/07/18 0845  Weight: 106.4 kg 103.1 kg 95.5 kg   Physical Exam: General:  Chronically ill appearing female sitting upright in bed on BiPAP  in NAD HEENT: pupils 4/reactive, full face mask Neuro: Awakes easily to verbal, f/c, MAE CV: SR PULM: even/non-labored, lungs bilaterally diminished GI: obese, soft, non-tender, bs active Extremities: warm/dry,  +1 edema  Skin: no rashes, right foot dressing intact   Resolved issues  Cardiac arrest, etiology unclear.  Question PE versus syncopal episode versus arrhythmia  Assessment & Plan:   Hypotension - thought initially due to  sepsis +/- r/o PE - continues to have leukocytosis, but afebrile  - midodrine started 2/26 - Cortisol 26 on 2/26, TSH 0.84 - evaluated by Ortho 2/25 who did not think her surgical site was source of infection - last iHD 2/24, volume removal limited given ongoing hypotension P:  Continue tele monitoring Hgb stable at 12.6 as of midnight Goal MAP 55 (for ESRD) AND as long as patient is maintaining her mental status Phenylephrine as needed to support HD Continue midodrine 10 mg TID Follow culture data- repeat [facility] sent 2/26 Continue broad spectrum cefepime until cultures negative. Holding home coreg, hydralazine Added Forinef.  Acute hypoxic and hypercarbic respiratory failure Concern for PE Hx OSA/ OHS - s/p heparin drip transitioned to eliquis 2/26 - TTE 2/21 with hyperdynamic systolic function, reduced RV function, normal size RA P:  Continue intermittent BiPAP to HFNC as tolerated, would recommend mandatory BiPAP while sleeping High risk for intubation, although currently is protecting airway and maintaining mental status ABG if AMS deteriorates Wean FiO2 for sats >88-94%  Ongoing aggressive pulm hygiene Possibly some of this is related to fluid balance given it has been limited in iHD 2/2 to hypotension Cefepime as above  Consider CTA PE- will have to be discussed with Nephrology, venous dupplex of extremities with limited study 2/20; Thrombus in RUE cephalic vein noted by IV team 2/20 Limit sedating meds   Leukocytosis - remains afebrile P:  Continue cefepime Follow repeat culture 2/26 Continue cefepime pending repeat cultures  Right toe fractures with soft tissue infection and osteomyelitis- Cx + S.Aureus- pan sensitive - s/p right 2nd and 3rd amputation 2/19 Plan Per Ortho abx as above PT/OT Wound care ongoing  CRP +/- imaging to rule out osteomyelitis as cause for ongoing leukocytosis.  Acute metabolic encephalopathy: Favor hypercarbia +/- toxic/metabolic  component  Plan Cont supportive care   End-stage renal disease, with intermittent fluid and electrolyte imbalance Plan Nephrology following, MWF schedule, last iHD 2/24; iHD canceled 2/26  Type 2 diabetes Plan ssi   Anemia of chronic disease Plan Trend CBC- stable  Diarrhea Plan Lomotil  Best practice:  Diet: NPO except for medications while on BiPAP Pain/Anxiety/Delirium protocol (if indicated): na/ VAP protocol (if indicated): n/a DVT prophylaxis: eliquis  GI prophylaxis: PPI Glucose control: SSI Mobility: BR Code Status: Full Family Communication: no family at bedside Disposition: ICU    Kipp Brood, MD [facility] ICU Physician [place]  Pager: [phone] Mobile: [phone] After hours: [phone].  03/07/2018, 11:14 AM       Kipp Brood, [place]

## 2018-03-07 NOTE — Progress Notes (Addendum)
FPTS Interim Progress Note  Spoke to [facility] physician on call to discuss patient's case. Discussed continued hypotension despite interventions by primary team. Also informed MD of acute hypoxic and hypercarbic respiratory failure which is not improving despite BIPAP use. Per MD will send someone from CCM team to see patient and evaluate patient. Appreciate CCM recommendations and evaluation of patient. RN made aware.    Caroline More, DO 03/07/2018, 1:54 AM PGY-2, [place] [facility] pager [phone]

## 2018-03-07 NOTE — Progress Notes (Signed)
   03/07/18 0100  Provider Notification  Provider Name/Title Family Medicine on Call provider  Date Provider Notified 11/05/18  Time Provider Notified 0107  Notification Type Page  Notification Reason Other (Comment) (persistent hypotension)  Response See new orders (580mL Bolus 0.9 NS)  Date of Provider Response 03/07/18  Time of Provider Response [phone]

## 2018-03-07 NOTE — Progress Notes (Signed)
Inpatient Diabetes Program Recommendations  AACE/ADA: New Consensus Statement on Inpatient Glycemic Control (2015)  Target Ranges:  Prepandial:   less than 140 mg/dL      Peak postprandial:   less than 180 mg/dL (1-2 hours)      Critically ill patients:  140 - 180 mg/dL   Lab Results  Component Value Date   GLUCAP 281 (H) 03/07/2018   HGBA1C 11.4 (H) 02/26/2018    Review of Glycemic Control  Blood sugars past 2 days 222-306 mg/dL.  Inpatient Diabetes Program Recommendations:  Increase Lantus to 20 units QD Add Novolog 4 units tidwc for meal coverage insulin if pt eats > 50% meal.   Will continue to follow.  Thank you. Lorenda Peck, RD, LDN, CDE Inpatient Diabetes Coordinator [phone]

## 2018-03-07 NOTE — Progress Notes (Addendum)
Rachel Chaney, MRN:  [id number], DOB:  1960-07-26, LOS: 58 ADMISSION DATE:  02/25/2018, CONSULTATION DATE:  02/28/18 REFERRING MD: Josephine Igo, MD CHIEF COMPLAINT:  S/p cardiac arrest  Brief History   58 year old female with ESRD who presented 2/17 for foot injury and admitted for amputation of 2nd and 3rd digit of right foot on 2/19. No post-op complications. Found unresponsive during dialysis on 2/20, CPR performed with ROSC after 6 minutes.  Some concern for PE and was on heparin gtt which was transitioned to eliquis as of 2/26.  She was extubated 2/21 and since has been intermittently on HFNC to BiPAP with variable lethargy and hypotension.  iHD canceled for 2/26 secondary to hypotension.  Was started on midodrine but still has ongoing soft pressures.  Given ongoing need for HFNC/ BiPAP, altered mental status, and hypotension, PCCM was reconsulted 2/27 am.   Past Medical History  ESRD on HD, HTN, DM2, GERD, diastolic HF, dyslipidemia   Significant Hospital Events   2/17 > admit 2/19 > Toe amputation on right foot 2/20 > Cardiac arrest, intubation, transferred to ICU 2/20 > started on heparin gtt empirically for presumed PE (IV team noted thrombus in RUE cephalic vein) 4/23: Extubated 2/22: Nursing staff reporting more lethargic, had been transferred to medicine service, new hypercarbic respiratory failure.  Placed on BiPAP 2/23: Remains off blood pressure medications for several days now in spite of what it says on MAR.  Hemodynamically stable.  Much more awake.  Suspect episode on 2/23 primarily due to hypercarbia. 2/24 PCCM off; iHD 2/25 intermittently on BiPAP 2/26 HD canceled 2/2 hypotension; intermittently on HFNC/ BiPAP  Consults:  PCCM Nephro  Procedures:  ETT 2/20 > 2/21 R fem CVL 2/20 > out R fem art line 2/20 > 2/20 (malfunctioned overnight) R radial art line 2/21 > 2/23  Significant Diagnostic Tests:  Right Foot XR 2/17 > displaced fracture of 1st and 2nd  proximal phalanx  ABI 2/19 > right and left ABI indicates noncompressible RLE, unable to obtain TBI due to movement. Echo 2/21 > left ventricle hyperdynamic, EF greater than 53% diastolic parameters consistent with diastolic dysfunction.  Right ventricular pressures are elevated consistent with volume overload as well as demonstrating diminished right ventricular systolic pressure right atrium was normal size LE duplex 2/20 > no DVT. UE duplex 2/21 > no DVTs.  Did have acute superficial vein thrombus in the right cephalic vein  Micro Data:  2/18 MRSA PCR >> staph positive 2/19 R foot Wound culture 2/19 > S. Aureus. Pan sensitive 2/21 BC x 2 >> neg 2/26 BC x 2 >>  Antimicrobials:  Vanc 2/17 >> 2/21 Ceftriaxone 2/17 >> 2/21 Ancef 2/17; 2/21 >> Metronidazole 2/21 >> 2/24 Cefepime 2/26 >>  Interim history/subjective:  On HFNC 10L (w/ ABG 7.272/ 57/ 116/ 26.3) to BiPAP around 2100.  ABG around midnight noted for 7.257/ 55.8/ 85/ 24.9. Setting increased from 12 to 16 IPAP; 8 EPAP, 50%  Objective   Blood pressure (!) 81/43, pulse 67, temperature 98.2 F (36.8 C), temperature source Axillary, resp. rate 16, height 5\' 4"  (1.626 m), weight 103.1 kg, last menstrual period 09/18/2011, SpO2 98 %.    FiO2 (%):  [50 %] 50 %   Intake/Output Summary (Last 24 hours) at 03/07/2018 0235 Last data filed at 03/06/2018 2000 Gross per 24 hour  Intake 432.63 ml  Output -  Net 432.63 ml   Filed Weights   03/01/18 1845 03/03/18 1330 03/03/18 1734  Weight: 106.4 kg 106.4 kg 103.1 kg   Physical Exam: General:  Chronically ill appearing female sitting upright in bed on BiPAP in NAD HEENT: pupils 4/reactive, full face mask Neuro: Awakes easily to verbal, f/c, MAE CV: SR PULM: even/non-labored, lungs bilaterally diminished GI: obese, soft, non-tender, bs active Extremities: warm/dry,  +1 edema  Skin: no rashes, right foot dressing intact   Resolved issues  Cardiac arrest, etiology unclear.  Question  PE versus syncopal episode versus arrhythmia  Assessment & Plan:   Hypotension - thought initially due to sepsis +/- r/o PE - continues to have leukocytosis, but afebrile  - midodrine started 2/26 - Cortisol 26 on 2/26, TSH 0.84 - evaluated by Ortho 2/25 who did not think her surgical site was source of infection - last iHD 2/24, volume removal limited given ongoing hypotension P:  Continue tele monitoring Hgb stable at 12.6 as of midnight Goal MAP 55 (for ESRD) AND as long as patient is maintaining her mental status Hold on adding pressors for now Continue midodrine 10 mg TID Follow culture data- repeat [facility] sent 2/26 Continue broad spectrum cefepime Holding home coreg, hydralazine  Acute hypoxic and hypercarbic respiratory failure Concern for PE Hx OSA/ OHS - s/p heparin drip transitioned to eliquis 2/26 - TTE 2/21 with hyperdynamic systolic function, reduced RV function, normal size RA P:  Continue intermittent BiPAP to HFNC as tolerated, would recommend mandatory BiPAP while sleeping High risk for intubation, although currently is protecting airway and maintaining mental status ABG if AMS deteriorates Wean FiO2 for sats >88-94%  Ongoing aggressive pulm hygiene CXR now Possibly some of this is related to fluid balance given it has been limited in iHD 2/2 to hypotension Cefepime as above  Consider CTA PE- will have to be discussed with Nephrology, venous dupplex of extremities with limited study 2/20; Thrombus in RUE cephalic vein noted by IV team 2/20 Limit sedating meds   Leukocytosis - remains afebrile P:  Continue cefepime Follow repeat culture 2/26 CXR now Continue cefepime  Right toe fractures with soft tissue infection and osteomyelitis- Cx + S.Aureus- pan sensitive - s/p right 2nd and 3rd amputation 2/19 Plan Per Ortho abx as above PT/OT Wound care ongoing   Acute metabolic encephalopathy: Favor hypercarbia +/- toxic/metabolic component  Plan Cont  supportive care   End-stage renal disease, with intermittent fluid and electrolyte imbalance Plan Nephrology following, MWF schedule, last iHD 2/24; iHD canceled 2/26  Type 2 diabetes Plan ssi   Anemia of chronic disease Plan Trend CBC- stable  Diarrhea Plan Lomotil  Best practice:  Diet: NPO while on BiPAP Pain/Anxiety/Delirium protocol (if indicated): na/ VAP protocol (if indicated): n/a DVT prophylaxis: eliquis  GI prophylaxis: PPI Glucose control: SSI Mobility: BR Code Status: Full Family Communication: no family at bedside Disposition: ICU    Kennieth Rad, MSN, AGACNP-BC [facility] Pgr: [phone] or if no answer [phone] 03/07/2018, 3:08 AM  Patient seen and examined, agree with above note.  I dictated the care and orders written for this patient under my direction. Acute resp failure with hypoxemia and hypercarbia R/o PE on eliquis, being treated also for HCAP on Cefepime MSSA growing on wound culture of right foot , needs MRI at some point of this area. Needs CT angio chest WBC rising and watery stools , check C diff. And electrolytes now. BP 46-270 Systolic for the last few days, good mental status, MAP ~60. Continue to monitor.  CC time spent : 35 mins  Roxanne Mins, [place]

## 2018-03-07 NOTE — Discharge Summary (Signed)
[facility] Discharge Summary  Patient name: Rachel Chaney Medical record number: [id number] Date of birth: February 23, 1960 Age: 58 y.o. Gender: female Date of Admission: 02/25/2018  Date of Discharge: 3/5 Admitting Physician: Rachel Ohara McDiarmid, MD  Primary Care Provider: Bufford Lope, DO Consultants: Nephrology, Orthopedics, CCM  Indication for Hospitalization: Right second toe fracture open, soft tissue infection c/f osteomyelitis, continued hospitalization for cardiac arrest 2/20 of unknown etiology, acute hypoxic and hypercarbic respiratory failure and acute hypotension  Discharge Diagnoses/Problem List:  Ventilator acquired pneumonia Right second toe fracture open, soft tissue infection c/f osteomyelitis Acute hypotension Acute hypoxic and hypercarbic respiratory failure Cardiac arrest 2/20 of unknown etiology - presumed PE Possible sepsis from ventilator associated pneumonia Acute metabolic encephalopathy/ altered mental status Acute musculoskeletal chest pain Multiple rib fractures End-stage renal disease on hemodialysis Type 2 diabetes melitus Chronic hypertension Hyperlipidemia Gastroesophageal reflux disease Heart failure with preserved ejection fraction Secondary hyperparathyroidism Hypoalbuminemia Anemia of chronic kidney disease Diarrhea  Disposition: Discharged to SNF  Discharge Condition: Stable  Discharge Exam:  General: Alert and cooperative and appears to be in no acute distress HEENT: Moist mucous membranes.  No cervical lymphadenopathy. Cardio: Distant heart sounds.  Regular rate and rhythm palpated from right radial. Pulm: Breathing comfortably on 4 L nasal cannula.  Rhonchi noted bilaterally without wheezing. Abdomen: Bowel sounds normal. Abdomen soft and non-tender.  Extremities: No peripheral edema. Warm/ well perfused.  Strong radial pulses. Neuro: Cranial nerves grossly intact   Brief Hospital Course:  Rachel Chaney was  admitted on 02/25/2018 for right second open toe fracture and soft tissue infection concerning for osteomyelitis. On 02/27/2018 she had a 2nd and 3rd ray amputation of R foot. She was noted to have a large purulent abscess extending to the midfoot. Cultures positive for staph aureus, and were pan-sensitive. She was treated with IV antibiotics. On 02/28/2018, she had a cardiac arrest of unknown etiology, was intubated and transferred to the ICU. She was presumed to have PE given decompensation was acute-onset post-surgery and she was noted to have a thrombus in RUE cephalic vein. CTA was not performed given ESRD. She was started on empiric treatment with heparin drip and extubated on 03/01/2018. On 03/02/2018 she was noted to be more lethargic with new hypercarbic respiratory failure. She was placed on BiPAP. By 03/03/2018 she was more alert and hemodynamically stable and was transferred back to the [facility] Medicine service. She continued to have intermittent altered mental status, persistent hypotension and a new oxygen requirement requiring HFNC. She was started on midodrine and received cautious dialysis per Nephrology. On 03/07/2018 a CTA was performed in agreement with Nephrology and was negative for PE but demonstrated bilateral consolidation concerning for pneumonia. Anticoagulation was stopped. She continued to receive treatment with IV cefepime which was completed on 3/3.  On the day of discharge, she had completed all antibiotics and shown significant improvement in respiratory status and maintaining a blood pressure within normal limits.  She will benefit from rehabilitation at SNF as her mobility has been since significantly impacted following CPR..      Issues for Follow Up:  1. Consider sleep study for likely sleep apnea. 2. Consider switching from Levemir to Lantus for once daily dosing to help with compliance.  3. Follow-up with cardiology will consider nuclear stress test imaging and/or cardiac event  monitor 4. Follow-up with hematology/oncology regarding chronic leukocytosis in the setting of a macrocytic anemia.  Significant Procedures:  ETT 2/20 > 2/21 R fem CVL  2/20 > out R fem art line 2/20 > 2/20 (malfunctioned overnight) R radial art line 2/21 > 2/23  Significant Labs and Imaging:  Recent Labs  Lab 03/12/18 0547 03/13/18 0532 03/14/18 0314  WBC 28.8* 27.1* 25.3*  HGB 10.8* 11.1* 10.9*  HCT 36.4 37.1 36.4  PLT 231 221 161   Recent Labs  Lab 03/09/18 1124 03/10/18 0751 03/11/18 0504 03/12/18 0547 03/13/18 0532 03/14/18 0314  NA 133* 137 135 138 137 135  K 4.3 3.7 3.5 3.1* 3.1* 4.0  CL 100 98 100 101 99 98  CO2 16* 18* 20* _0 GLUCOSE 195* 170* 197* 135* 188* 217*  BUN 56* 32* 52* 21* 39* 26*  CREATININE 8.00* 5.30* 7.05* 4.29* 6.46* 4.58*  CALCIUM 9.3 9.1 8.7* 8.5* 9.0 8.9  PHOS 4.2 4.7* 4.2  --  3.4  --   ALBUMIN 2.3* 2.4* 2.2*  --  2.3*  --       Results/Tests Pending at Time of Discharge:  Blood culture drawn 3/1 - NGTD at 4 days   Discharge Medications:  Allergies as of 03/14/2018   No Known Allergies     Medication List    STOP taking these medications   calcium acetate 667 MG capsule Commonly known as:  PHOSLO   carvedilol 25 MG tablet Commonly known as:  COREG   hydrALAZINE 50 MG tablet Commonly known as:  APRESOLINE     TAKE these medications   acetaminophen 500 MG tablet Commonly known as:  TYLENOL Take 1,000 mg by mouth every 6 (six) hours as needed for pain.   atorvastatin 80 MG tablet Commonly known as:  LIPITOR Take 1 tablet (80 mg total) by mouth daily.   AURYXIA 1 GM 210 MG(Fe) tablet Generic drug:  ferric citrate Take 420 mg by mouth 3 (three) times daily with meals.   calcitRIOL 0.25 MCG capsule Commonly known as:  ROCALTROL Take 5 capsules (1.25 mcg total) by mouth every Monday, Wednesday, and Friday with hemodialysis. Start taking on:  March 15, 2018 What changed:  See the new instructions.    cyclobenzaprine 10 MG tablet Commonly known as:  FLEXERIL Take 1 tablet (10 mg total) by mouth at bedtime.   Darbepoetin Alfa 25 MCG/0.42ML Sosy injection Commonly known as:  ARANESP Inject 0.42 mLs (25 mcg total) into the vein every Monday with hemodialysis.   diclofenac sodium 1 % Gel Commonly known as:  VOLTAREN Apply 4 g topically 4 (four) times daily.   feeding supplement (PRO-STAT SUGAR FREE 64) Liqd Take 30 mLs by mouth 2 (two) times daily.   fluticasone 50 MCG/ACT nasal spray Commonly known as:  FLONASE Place 1 spray into both nostrils daily. 1 spray in each nostril every day   glucose blood test strip Commonly known as:  ONE TOUCH ULTRA TEST 1 each by Other route 3 (three) times daily. ICD-10 code: E11.40.   hydrOXYzine 50 MG tablet Commonly known as:  ATARAX/VISTARIL Take 50 mg by mouth 2 (two) times daily as needed for itching.   insulin aspart 100 UNIT/ML injection Commonly known as:  novoLOG Inject 0-20 Units into the skin 3 (three) times daily with meals.   Insulin Detemir 100 UNIT/ML Pen Commonly known as:  LEVEMIR FLEXTOUCH Inject 12.5 Units into the skin 2 (two) times daily. What changed:  See the new instructions.   Insulin Pen Needle 29G X 12MM Misc Inject 1 pen into the skin 2 (two) times daily. Check blood sugar daily.   Insulin  Pen Needle 31G X 5 MM Misc Commonly known as:  B-D UF III MINI PEN NEEDLES USE AS DIRECTED FOR INSULIN INJECTION TWICE DAILY   midodrine 10 MG tablet Commonly known as:  PROAMATINE Take 1 tablet (10 mg total) by mouth 3 (three) times daily with meals.   multivitamin Tabs tablet Take 1 tablet by mouth at bedtime.   ONE TOUCH ULTRA 2 w/Device Kit 1 kit by Does not apply route 3 (three) times daily. ICD-10 code: E11.40   glucose monitoring kit monitoring kit 1 each by Does not apply route as needed for other.   ONETOUCH DELICA LANCETS 76L Misc 1 Device by Does not apply route as needed (to check blood glucose).    pantoprazole 40 MG tablet Commonly known as:  PROTONIX Take 1 tablet (40 mg total) by mouth daily. Start taking on:  March 15, 2018   ULTICARE INSULIN SYRINGE 30G X 5/16" 1 ML Misc Generic drug:  Insulin Syringe-Needle U-100 USE AS DIRECTED       Discharge Instructions: Please refer to Patient Instructions section of EMR for full details.  Patient was counseled important signs and symptoms that should prompt return to medical care, changes in medications, dietary instructions, activity restrictions, and follow up appointments.   Follow-Up Appointments: Follow-up Information    [name], MD In 1 week.   Specialty:  Orthopedic Surgery Contact information: [place] [address] [phone]           Matilde Haymaker, MD 03/14/2018, 2:48 PM [place]

## 2018-03-07 NOTE — Progress Notes (Signed)
FPTS Interim Progress Note  S: Paged by RN that patient's BP continued to drop despite midodrine. BP 83/41 with MAP 54. On arrival patient easily arousable but does appear sleepy. Oriented to person, place, and time. Remembers daughters name. Denies any symptoms. Euvolemic on exam.   O: BP (!) 83/41   Pulse 69   Temp 98.2 F (36.8 C) (Axillary)   Resp 17   Ht 5\' 4"  (1.626 m)   Wt 103.1 kg   LMP 09/18/2011   SpO2 98%   BMI 39.01 kg/m     A/P: Hypotension Patient continues to be hypotensive despite midodrine 10mg  tid. Given no response will have to give 500cc bolus to attempt to improve BP. Patient ESRD so will be very careful with fluids.  -500cc bolus -continue midodrine tid -HD when able  -continue cefepime  -given that BP continues to be low will plan to consult CCM (have paged on call attending, awaiting call back) -informed RN to please call with any lowering BP readings   Acute hypoxic and hypercarbic respiratory failure ABG with only minimal improvement despite continuous bipap use. Oriented x3 which is re-assuring, but continues to be sleepy.  -will plan to consult CCM given continued respiratory failure (patient is Full code)  Caroline More, DO 03/07/2018, 1:23 AM PGY-2, [facility] pager [phone]

## 2018-03-07 NOTE — Progress Notes (Signed)
Nutrition Follow-up  DOCUMENTATION CODES:   Obesity unspecified  INTERVENTION:   Encourage ONS intake when not on BiPAP, PO diet as tolerated  Ensure Enlive po BID, each supplement provides 350 kcal and 20 grams of protein  Continue:  Pro-stat 30 ml BID to maximize protein intake to support wound healing; each supplement provides 100 kcal and 15 gm protein. Nena-Vit  NUTRITION DIAGNOSIS:   Increased nutrient needs related to wound healing, chronic illness as evidenced by estimated needs.  Ongoing   GOAL:   Patient will meet greater than or equal to 90% of their needs  Progressing   MONITOR:   PO intake, Supplement acceptance, Labs, Skin  ASSESSMENT:   58 yo female with PMH of DM, HLD, HTN, CHF, ESRD on HD who was admitted with osteomyelitis and abscess of R foot second and third toes.   Pt discussed during ICU rounds and with RN.  Pt has had episodes of hypotension, iHD cancelled 2/26. Pt intermittently on HFNC to BiPAP with variable lethargy and hypotension.   2/17 admitted with osteomyelitis  2/19 s/p amputation of 2nd and 3rd digits of R food 2/20 cardiac arrest during dialysis  2/21 extubated  Labs reviewed.K+ 3.1 (L), PO4: 4.9 (H) Medications reviewed and include novolog, 25 lantus daily, Rena-vit Pt is - 5 L, weight down to 93.6 kg     Diet Order:   Diet Order            Diet Carb Modified Fluid consistency: Thin; Room service appropriate? Yes  Diet effective now              EDUCATION NEEDS:   Education needs have been addressed  Skin:  Skin Assessment: Skin Integrity Issues: Skin Integrity Issues:: Diabetic Ulcer Diabetic Ulcer: R foot abscess 2nd and 3rd toe   Last BM:  2/27  Height:   Ht Readings from Last 1 Encounters:  02/25/18 5\' 4"  (1.626 m)    Weight:   Wt Readings from Last 1 Encounters:  03/07/18 93.6 kg   02/25/18 104.3 kg (admit weight)    Ideal Body Weight:  54.5 kg  BMI:  39.5 using admit weight  Estimated  Nutritional Needs:   Kcal:  2000-2200  Protein:  100-115 gm  Fluid:  UOP +1 L  Maylon Peppers RD, LDN, CNSC [phone] Pager [phone] After Hours Pager

## 2018-03-07 NOTE — Progress Notes (Addendum)
Family Medicine Teaching Service Daily Progress Note Intern Pager: [phone]  Patient name: Rachel Chaney Medical record number: [id number] Date of birth: 01/29/1960 Age: 58 y.o. Gender: female  Primary Care Provider: Bufford Lope, DO Consultants: Nephrology, Orthopedics Code Status: Full code  Pt Overview and Major Events to Date:  2/17 > admit 2/19 > Toe amputation on right foot 2/20 > Cardiac arrest, intubation, transferred to ICU 2/20 > started on heparin gtt empirically for presumed PE (IV team noted thrombus in RUE cephalic vein) 7/34: Extubated 2/22: Nursing staff reporting more lethargic, had been transferred to medicine service, new hypercarbic respiratory failure.  Placed on BiPAP 2/23: Remains off blood pressure medications for several days now in spite of what it says on MAR.  Hemodynamically stable.  Much more awake.  Suspect episode on 2/23 primarily due to hypercarbia.  Assessment and Plan: Rachel I Dorsettis a 58 y.o.femalepresenting with painful 2nd toe of R foot. PMH is significant forESRD on HD MWF,T2DM,HTN, GERD, CHF, and dyslipidemia.  #Chronic HTN, with acute hypotension Have had difficulty maintaining BPs for the past 24 hours. Patient with BPs 80s/40s overnight and was given midodrine and 500 cc bolus. BP now improved ranging 105-132/50-53. A&O x 4 this AM. Cardiac workup including EKG, and trop was negative yesterday. PE on the differential although less likely to cause hypotension and patient has been receiving tx with heparin gtt, was transitioned to Elliquis yesterday. Spoke to Nephrology today who are concerned about possible pericardial bleed given CXR shows enlarged cardiac silhouette. They are ok with getting CTA to evaluate for PE for the purpose of determining long-term anticoagulation, but think a PE is an unlikely cause of current presentation with hypotension and pleural effusion. Sepsis from possible infection still high on the differential. Abx  was broadened to cefepime yesterday. Ortho evaluated surgical site and did not think wound appeared to be infected. Repeat blood cultures yesterday showing no growth at 24 hours, UA ordered but still not collected. Hypotension could also be due to volume depletion and/or known hypoalbuminemia. She will receive dialysis today per Nephro. TSH wnl. Cortisol slightly elevated. - Consult nephrology, appreciate recs - Echo ordered per nephrology recommendations - CTA - Holding home hydral, carvedilol - Continue midodrine 10 mg tid - Cautious dialysis today - Continue Cefepime 2 g IV on dialysis, 1 g IV not on dialysis - F/U Bcx, UA - Consider soft tissue ultrasound to further evaluate leg abscess as potential source of infection  #Acute hypoxic and hypercarbic respiratory failure Likely multifactorial with likely underlying OSA and OHS with atelectasis, volume excess vs. pneumonitis and presumed PE. ABGs continue to demonstrate elevated pCO2 with trial on BiPap. Recent CXR with interstitial prominence c/f fluid vs. pneumonitis. Currently treating for possible PE as below. Patient with continued elevated WBC and oxygen requirement.  - Pulmonology recommendations appreciated - Wean O2, titrate for sat 88-92% - manditory BiPap at night - Trial on BiPap, repeat ABG - Would benefit from mobilization, but pt should be nonweightbearing on the operative foot - Sleep study as outpatient   #Presumed pulmonary embolism/ s/p cardiac arrest 2/20 S/P cardiac arrest 2/20 with unclear etiology. Thought to be PE given patient was post-op and IV team noted thrombus in RUE cephalic vein. CTA chest not performed given ESRD, was discussed with nephrology. V/Q scan would likely be equivocal in the setting of marked intersitial prominence on CXR, would not change management. S/p heparin gtt, transitioned to oral apixaban yesterday. Other possible etiologies for cardiac arrest  include sepsis from infection, and arrhythmia.  Continued concern that hypotension and increased O2 requirement is due to PE, will await results of Echo and consider CTA chest after discussion with nephro. - Continue apixaban 5 mg bid for treatment dose - Echo  - STAT CTA chest assess for PE - Telemetry - Full code status  #Right second toe fractureOPEN, soft tissue infection, concerning for osteomyelitis: Patient w/ R toe open fx, soft tissue infection and osteo In the setting of uncontrolled diabetes now s/p amputation of 2nd and 3rd right toe on 2/19. Was found to have large purulent abscess extending to the midfoot. Cultures obtained x2, positive for S. Aureus, pan-sensitive. Patient has remained afebrile but white count remains elevated with WBC in the 30s. Surgical site does not appear infected per ortho. - S/p amputation 2nd and 3rd R toes 2/19 - Continue IV cefepime renal dosing - PT/OT - Would benefit from mobilization, but pt should be nonweightbearing on the operative foot - If no improvement in BP or overall decline in patient's status, consider soft tissue ultrasound to further evaluate leg abscess as potential source of infection  #Altered mental status/ Acute metabolic encephalopathy, resolved Patient with decreased consciousness yesterday likely due to hypoperfusion from low MAP. Patient A&Ox4 on exam today.  Hypercarbia from respiratory failure is likely contributing. Could also be part of waxing/waning course of delirium from meds, hospitalization, infection. B12 elevated yesterday. Head CT without intracranial abnormality. - Manage blood pressure as above - Trial on Bipap, repeat ABG - Continue abx as above  #Acute musculoskeletal chest pain From CPR on 2/20. Pain improving. - Continue voltaren gel - Tylenol q6h PRN - Avoid PO NSAIDs  #End-stage renal disease on HD Receives hemodialysis MWF at [facility]. Cancelled session today given hypotension. - Dialysis per nephro - Nephro following, appreciate recs -  Renal/carb diet   #T2DM:HbA1c 11.4% 02/26/18. 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 Home regimen is 50 units Levemir twice daily. Unclear if she is compliant with this at home given good response to insulin regimen here.  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 Was receiving treatment as outpatient.  - Nephro following, appreciate recs - Continue home calcitriol and auryxia  #HFpEF: Last echo 04/12/2015 with Grade 1 diastolic dysfunction, EF 62-13%. Patient with 1+ peripheral edema on exam but no crackles. Does not take diuretics at home. - Holding home carvedilol as above  #Hypoalbuminemia Albumin 2.6 on admission. No evidence of liver disease. Urinalysis from 2017 with > 300 protein indicating that there is likely a nephrotic syndrome component to her hypoalbuminemia. Protein calorie malnutrition given poor diet could also be contributing. S/p albumin 12.5 g in the ED. - Pro-Stat and renal MVI supplementation fell off when switching back from ICU, consider restarting - Nephro following, appreciate recs  #Anemia of CKD:  Hgb 10.8 on admission, stable (baseline in the 12's as outpatient). Per nephro, suspect some blood loss with injury. Has not yet required ESA. MCV elevated to 109 suggesting B12 or folate deficiency. - Daily CBCs - F/U B12, folate  #Hyperlipidemia: Most recent lipid panel April 2019 showing low cholesterol at 94, low HDL at 32, and other values within normal limits. - Continue atorvastatin 80 mg  #GERD: Patient asymptomatic at this time,  usually takes Tums at home. -Monitor for acid reflux symptoms.  FEN/GI: Carb/renal diet PPx: heparin  Disposition: Step-down  Subjective:  Patient hypotensive  overnight, given midodrine. MD called to bedside and gave 500 cc bolus.  Patient A&Ox4 on BiPap. She denies CP or feeling short of breath. She has no complaints this morning but requests water.  Objective: Temp:  [86 F (30 C)-98.2 F (36.8 C)] 97.6 F (36.4 C) (02/27 0845) Pulse Rate:  [57-87] 82 (02/27 0845) Resp:  [10-32] 22 (02/27 0845) BP: (76-132)/(38-87) 132/53 (02/27 0845) SpO2:  [92 %-100 %] 94 % (02/27 0845) FiO2 (%):  [40 %-50 %] 40 % (02/27 0739) Weight:  [95.5 kg] 95.5 kg (02/27 0845)   Physical Exam: General: lying in bed on BiPap, conversing appropriately Cardiovascular: RRR Respiratory: normal work of breathing on BiPap, lungs with decreased air movement throughout, no crackles or wheezing Abdomen: soft, nontender Extremities: trace edema Neuro: A&Ox4  Laboratory: Recent Labs  Lab 03/05/18 0500 03/06/18 0845  03/06/18 1807 03/07/18 0007 03/07/18 0543  WBC 33.4* 31.8*  --   --   --  41.5*  HGB 10.5* 10.8*   < > 11.6* 12.6 11.2*  HCT 36.5 37.9   < > 34.0* 37.0 38.2  PLT 338 307  --   --   --  359   < > = values in this interval not displayed.   Recent Labs  Lab 02/28/18 2232  03/05/18 0500 03/06/18 0339  03/06/18 1807 03/07/18 0007 03/07/18 0543  NA 136   < > 135 133*   < > 134* 134* 133*  K 4.1   < > 3.7 3.9   < > 3.5 3.9 4.0  CL 96*   < > 98 96*  --   --   --  96*  CO2 27   < > 22 24  --   --   --  21*  BUN 20   < > 27* 41*  --   --   --  59*  CREATININE 5.28*   < > 3.99* 5.78*  --   --   --  7.64*  CALCIUM 8.1*   < > 8.7* 8.8*  --   --   --  9.1  PROT 6.6  --   --   --   --   --   --   --   BILITOT 1.4*  --   --   --   --   --   --   --   ALKPHOS 173*  --   --   --   --   --   --   --   ALT 96*  --   --   --   --   --   --   --   AST 145*  --   --   --   --   --   --   --   GLUCOSE 217*   < > 251* 260*  --   --   --  252*   < > = values in this interval not displayed.      Imaging/Diagnostic Tests: Dg Foot Complete Right  Result  Date: 02/25/2018 CLINICAL DATA:  Toe injury. EXAM: RIGHT FOOT COMPLETE - 3+ VIEW COMPARISON:  Radiographs of May 14, 2012. FINDINGS: Moderately displaced fracture is seen involving the proximal portion of the first proximal phalanx with intra-articular extension with some callus formation suggesting subacute fracture. Minimally displaced fracture is seen  involving the proximal portion of the second distal phalanx. Soft tissue irregularity or wound is seen involving the distal portion of the second toe. Mild posterior calcaneal spurring is noted. IMPRESSION: Probable subacute moderately displaced fracture is seen involving the first proximal phalanx with intra-articular extension. Probable minimally displaced acute fracture is seen involving proximal portion of the second distal phalanx. Overlying soft tissue irregularity or wound is seen involving the distal soft tissues of the second toe. Electronically Signed   By: Marijo Conception, M.D.   On: 02/25/2018 12:17       Mitchell Heir, Medical Student 03/07/2018, 9:07 AM [place] Intern pager: [phone], text pages welcome  RESIDENT ATTESTATION OF STUDENT NOTE   I have seen and examined this patient.   I have discussed the findings and exam with the medical student and agree with the above note, which I have edited appropriately. I helped develop the management plan that is described in the student's note, and I agree with the content.   Bonnita Hollow, MD 03/07/2018, 2:50 PM

## 2018-03-08 ENCOUNTER — Inpatient Hospital Stay (HOSPITAL_COMMUNITY): Payer: Medicare Other

## 2018-03-08 DIAGNOSIS — I313 Pericardial effusion (noninflammatory): Secondary | ICD-10-CM

## 2018-03-08 DIAGNOSIS — I959 Hypotension, unspecified: Secondary | ICD-10-CM

## 2018-03-08 LAB — BASIC METABOLIC PANEL
Anion gap: 11 (ref 5–15)
BUN: 38 mg/dL — ABNORMAL HIGH (ref 6–20)
CO2: 24 mmol/L (ref 22–32)
Calcium: 9.1 mg/dL (ref 8.9–10.3)
Chloride: 97 mmol/L — ABNORMAL LOW (ref 98–111)
Creatinine, Ser: 6.27 mg/dL — ABNORMAL HIGH (ref 0.44–1.00)
GFR calc Af Amer: 8 mL/min — ABNORMAL LOW (ref >60)
GFR calc non Af Amer: 7 mL/min — ABNORMAL LOW (ref >60)
Glucose, Bld: 364 mg/dL — ABNORMAL HIGH (ref 70–99)
Potassium: 4.4 mmol/L (ref 3.5–5.1)
Sodium: 132 mmol/L — ABNORMAL LOW (ref 135–145)

## 2018-03-08 LAB — CBC
HCT: 36.7 % (ref 36.0–46.0)
Hemoglobin: 10.8 g/dL — ABNORMAL LOW (ref 12.0–15.0)
MCH: 31.4 pg (ref 26.0–34.0)
MCHC: 29.4 g/dL — ABNORMAL LOW (ref 30.0–36.0)
MCV: 106.7 fL — ABNORMAL HIGH (ref 80.0–100.0)
Platelets: 358 10*3/uL (ref 150–400)
RBC: 3.44 MIL/uL — ABNORMAL LOW (ref 3.87–5.11)
RDW: 15.3 % (ref 11.5–15.5)
WBC: 32.8 10*3/uL — ABNORMAL HIGH (ref 4.0–10.5)
nRBC: 3.2 % — ABNORMAL HIGH (ref 0.0–0.2)

## 2018-03-08 LAB — C DIFFICILE QUICK SCREEN W PCR REFLEX
C Diff antigen: NEGATIVE
C Diff interpretation: NOT DETECTED
C Diff toxin: NEGATIVE

## 2018-03-08 LAB — BRAIN NATRIURETIC PEPTIDE: B Natriuretic Peptide: 2084.7 pg/mL — ABNORMAL HIGH (ref 0.0–100.0)

## 2018-03-08 LAB — ECHOCARDIOGRAM COMPLETE
Height: 64 in
Weight: 3301.61 oz

## 2018-03-08 LAB — GLUCOSE, CAPILLARY
Glucose-Capillary: 352 mg/dL — ABNORMAL HIGH (ref 70–99)
Glucose-Capillary: 320 mg/dL — ABNORMAL HIGH (ref 70–99)
Glucose-Capillary: 210 mg/dL — ABNORMAL HIGH (ref 70–99)
Glucose-Capillary: 75 mg/dL (ref 70–99)

## 2018-03-08 MED ORDER — HEPARIN (PORCINE) 25000 UT/250ML-% IV SOLN: 1300.0000 [IU]/h | INTRAVENOUS | Status: DC

## 2018-03-08 MED ORDER — PERFLUTREN LIPID MICROSPHERE
4.0000 mL | Freq: Once | INTRAVENOUS | Status: AC
  Administered 2018-03-08: 4 mL via INTRAVENOUS

## 2018-03-08 MED ORDER — PERFLUTREN LIPID MICROSPHERE
INTRAVENOUS | Status: AC
  Administered 2018-03-08: 12:00:00
  Filled 2018-03-08: qty 10

## 2018-03-08 MED ORDER — CHLORHEXIDINE GLUCONATE CLOTH 2 % EX PADS
6.0000 | MEDICATED_PAD | Freq: Every day | CUTANEOUS | Status: DC
  Administered 2018-03-08 – 2018-03-09 (×2): 6 via TOPICAL

## 2018-03-08 MED ORDER — HEPARIN SODIUM (PORCINE) 5000 UNIT/ML IJ SOLN
5000.0000 [IU] | Freq: Three times a day (TID) | INTRAMUSCULAR | Status: DC
  Administered 2018-03-08 – 2018-03-14 (×17): 5000 [IU] via SUBCUTANEOUS
  Filled 2018-03-08 (×17): qty 1

## 2018-03-08 NOTE — Progress Notes (Signed)
ANTICOAGULATION CONSULT NOTE   Pharmacy Consult for Heparin Indication: DVT  No Known Allergies  Patient Measurements: Height: 5\' 4"  (162.6 cm) Weight: 206 lb 5.6 oz (93.6 kg) IBW/kg (Calculated) : 54.7 Heparin Dosing Weight: 80 kg  Vital Signs: Temp: 97.7 F (36.5 C) (02/28 0000) Temp Source: Oral (02/28 0000) BP: 107/56 (02/28 0000) Pulse Rate: 84 (02/28 0000)  Labs: Recent Labs    03/05/18 0500 03/05/18 1450 03/06/18 0339 03/06/18 0758 03/06/18 0845  03/07/18 0007 03/07/18 0543 03/07/18 0949  HGB 10.5*  --   --   --  10.8*   < > 12.6 11.2* 12.9  HCT 36.5  --   --   --  37.9   < > 37.0 38.2 38.0  PLT 338  --   --   --  307  --   --  359  --   LABPROT 17.5*  --  14.8  --   --   --   --   --   --   INR 1.5*  --  1.2  --   --   --   --   --   --   HEPARINUNFRC 1.32* 0.40 0.42  --   --   --   --   --   --   CREATININE 3.99*  --  5.78*  --   --   --   --  7.64*  --   TROPONINI  --   --   --  0.13*  --   --   --   --   --    < > = values in this interval not displayed.   Estimated Creatinine Clearance: 8.8 mL/min (A) (by C-G formula based on SCr of 7.64 mg/dL (H)).  Assessment: 58 yo female with VTE for heparin.  Recently switched from heparin to Eliquis, last dose 2130 on 2/27 Goal of Therapy:  APTT 66-102 Heparin level 0.3-0.7 units/ml Monitor platelets by anticoagulation protocol: Yes   Plan:  Start heparin 1300 units/hr at 1000 APTT in 8 hours.  Phillis Knack, PharmD, BCPS

## 2018-03-08 NOTE — Progress Notes (Signed)
Physical Therapy Treatment Patient Details Name: Rachel Chaney MRN: [id number] DOB: 12/21/1960 Today's Date: 03/08/2018    History of Present Illness 58 year old female who admitted for crush injury of foot. 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 Wound culture grew S. Aureus with sensitivities pending. During dialysis, patient was found unresponsive. CPR was performed, she was intubated 2/20 and transferred to ICU. Presumed PE (IV team noted thrombus in RUE cephalic vein) Extubated 7/58/83 PMH: ESRD on HD, HTN, DM2, GERD    PT Comments    Pt in bed and eager to participate with physical therapy to sit up in chair. Pt has difficulty understanding NWB precautions on RLE and requires frequent cuing and assistance. Pt unable to follow multistep commands to attempt standing today. Pt would continue to benefit from acute therapy to progress functional mobility and independence.    Follow Up Recommendations  SNF;Supervision/Assistance - 24 hour     Equipment Recommendations  Wheelchair (measurements PT);Hospital bed    Recommendations for Other Services       Precautions / Restrictions Precautions Precautions: Fall Restrictions Weight Bearing Restrictions: Yes RLE Weight Bearing: Non weight bearing    Mobility  Bed Mobility Overal bed mobility: Needs Assistance Bed Mobility: Supine to Sit       Sit to supine: Min assist;+2 for physical assistance;HOB elevated   General bed mobility comments: pt initiated but required increased effort and trials in order to sit EOB, assist to fully complete transfer and square up EOB  Transfers Overall transfer level: Needs assistance   Transfers: Lateral/Scoot Transfers          Lateral/Scoot Transfers: Max assist;+2 physical assistance;+2 safety/equipment General transfer comment: multiple attempts to stand but pt unable to follow multistep command to stand, pt then completed lateral scoot transfer to drop  arm chair toward the left, pt with increased effort to complete transfer, pt required assistance and cuing to maintain NWB precautions during transfer  Ambulation/Gait             General Gait Details: unable   Stairs             Wheelchair Mobility    Modified Rankin (Stroke Patients Only)       Balance Overall balance assessment: Needs assistance Sitting-balance support: Bilateral upper extremity supported;Feet supported Sitting balance-Leahy Scale: Poor Sitting balance - Comments: pt dependent on bilat UEs to support self EOB                                    Cognition Arousal/Alertness: Awake/alert Behavior During Therapy: WFL for tasks assessed/performed Overall Cognitive Status: Impaired/Different from baseline Area of Impairment: Orientation;Attention;Memory;Following commands;Awareness;Safety/judgement;Problem solving                   Current Attention Level: Focused Memory: Decreased short-term memory;Decreased recall of precautions Following Commands: Follows one step commands inconsistently;Follows one step commands with increased time Safety/Judgement: Decreased awareness of deficits;Decreased awareness of safety Awareness: Emergent Problem Solving: Slow processing;Difficulty sequencing;Requires verbal cues;Requires tactile cues General Comments: pt unable to follow multistep command of NWB on RLE and to push up with left hand on bed, poor problem solving      Exercises Total Joint Exercises Long Arc Quad: AROM;Strengthening;Both;10 reps;Seated Marching in Standing: AROM;Both;10 reps;Seated    General Comments        Pertinent Vitals/Pain Pain Assessment: No/denies pain  Home Living                      Prior Function            PT Goals (current goals can now be found in the care plan section) Progress towards PT goals: Progressing toward goals    Frequency    Min 3X/week      PT Plan  Current plan remains appropriate(Simultaneous filing. User may not have seen previous data.)    Co-evaluation              AM-PAC PT "6 Clicks" Mobility   Outcome Measure  Help needed turning from your back to your side while in a flat bed without using bedrails?: A Little(Simultaneous filing. User may not have seen previous data.) Help needed moving from lying on your back to sitting on the side of a flat bed without using bedrails?: A Lot(Simultaneous filing. User may not have seen previous data.) Help needed moving to and from a bed to a chair (including a wheelchair)?: A Lot(Simultaneous filing. User may not have seen previous data.) Help needed standing up from a chair using your arms (e.g., wheelchair or bedside chair)?: Total(Simultaneous filing. User may not have seen previous data.) Help needed to walk in hospital room?: Total(Simultaneous filing. User may not have seen previous data.) Help needed climbing 3-5 steps with a railing? : Total(Simultaneous filing. User may not have seen previous data.) 6 Click Score: 59(DGLOVFIEPPIR filing. User may not have seen previous data.)    End of Session Equipment Utilized During Treatment: Gait belt;Oxygen(Simultaneous filing. User may not have seen previous data.) Activity Tolerance: Patient tolerated treatment well(Simultaneous filing. User may not have seen previous data.) Patient left: in chair;with call bell/phone within reach;with family/visitor present(Simultaneous filing. User may not have seen previous data.) Nurse Communication: Mobility status;Need for lift equipment(Simultaneous filing. User may not have seen previous data.) PT Visit Diagnosis: Muscle weakness (generalized) (M62.81);Other symptoms and signs involving the nervous system (R29.898);Other abnormalities of gait and mobility (R26.89);Difficulty in walking, not elsewhere classified (R26.2)(Simultaneous filing. User may not have seen previous data.)     Time:  1315-1341 PT Time Calculation (min) (ACUTE ONLY): 26 min  Charges:  $Therapeutic Activity: 23-37 mins                     [place], [place] [phone]    Rachel Chaney 03/08/2018, 2:00 PM

## 2018-03-08 NOTE — Progress Notes (Addendum)
Family Medicine Teaching Service Daily Progress Note Intern Pager: [phone]  Patient name: Rachel Chaney Medical record number: [id number] Date of birth: May 18, 1960 Age: 58 y.o. Gender: female  Primary Care Provider: Bufford Lope, DO Consultants: Nephrology, Orthopedics, CCM Code Status: Full code  Pt Overview and Major Events to Date:  2/17 > admit 2/19 > Toe amputation on right foot 2/20 > Cardiac arrest, intubation, transferred to ICU 2/20 > started on heparin gtt empirically for presumed PE (IV team noted thrombus in RUE cephalic vein) 3/00: Extubated 2/22: Nursing staff reporting more lethargic, had been transferred to medicine service, new hypercarbic respiratory failure.  Placed on BiPAP 2/23: Remains off blood pressure medications for several days now in spite of what it says on MAR.  Hemodynamically stable.  Much more awake.  Suspect episode on 2/23 primarily due to hypercarbia. 2/26: AMS with persistent hypotension. Started midodrine. 2/27: CTA demonstrates bilateral consolidation c/f PNA, no PE. D/C anticoagulation.   Antibiotic course: Vanc (2/17- 2/21) Ceftriaxone (2/17-2/21) Ancef (2/17; 2/19; 2/21-2/26) Metronidazole (2/21-2/24) Cefepime (2/26 - ?)   Assessment and Plan: Rachel I Dorsettis a 58 y.o.femalepresenting with painful 2nd toe of R foot. PMH is significant forESRD on HD MWF,T2DM,HTN, GERD, CHF, and dyslipidemia.  #Possible sepsis from ventilator associated pneumonia?/ hypotension Patient with multiple days of hypotension and increased oxygen requirement following intubation on 2/20. CTA yesterday was negative for PE and positive for bilateral lower-lobe consolidation c/f pneumonia. Treatment for presumed PE was stopped. Patient has been on IV cefepime. Patient has multiple risk factors for MDR VAP including >5 days of hospitalization prior to VAP and renal replacement therapy prior to VAP. Possible that this worsening infection is causing persistent  hypotension and altered mental status, which has resolved. However, cardiac etiology of persistent hypotension is still on the differential, given new marked cardiomegaly on CXR. CTA did not show pericardial effusion. Will plan for echo to further assess. Patient remains afebrile. White count now down to 32 from 41 yesterday. BCx with no growth at 24 hours. Patient is currently covered for gram positives, gram negatives and pseudomonas. Considered broadening coverage today to include MRSA but pt's initial MRSA PCR was negative. If condition worsens, would start on Vanc for VAP.  - Echo per nephrology recs - holding home hydral, carvedilol - Continue midodrine 10 mg tid - Continue Cefepime IV, renally dosed, plan for 7 more days (until 3/5) - Consider adding vancomycin if respiratory status worsens  #Acute hypoxic and hypercarbic respiratory failure Multifactorial with likely underlying OSA, OHS, and new bilateral pneumonia noted on CTA yesterday. ABGs continue to show elevated pCO2. Treating pneumonia as above. Stopped empiric treatment for presumed PE. Patient with continued elevated WBC and oxygen requirement.  - Pulmonology recommendations appreciated - Wean O2, titrate for sat 88-92% - Mandatory BiPap at night - Would benefit from mobilization, but pt should be nonweightbearing on the operative foot - PT/OT - Sleep study as outpatient   #Multiple rib fractures From CPR on 2/20. Left ribs 1-6 acute minimally displaced anterior fractures. Right rib posterior and rib 2-6 anterior minimally displaced rib fractures. Patient endorses minimal pain. - Continue voltaren gel - Schedule Tylenol q6h  - Avoid PO NSAIDs - Avoid opioids given sedative effect  #S/p cardiac arrest 2/20 - unknown etiology S/P cardiac arrest 2/20 with unclear etiology. Patient received empiric tx for presumed PE. CTA on 2/27 showed no PE. Other possible etiologies for cardiac arrest include sepsis from infection, and  arrhythmia.  - Echo  -  Telemetry - Full code status  #Right second toe fractureOPEN, soft tissue infection, concerning for osteomyelitis: Patient w/ R toe open fx, soft tissue infection and osteo In the setting of uncontrolled diabetes now s/p amputation of 2nd and 3rd right toe on 2/19. Was found to have large purulent abscess extending to the midfoot. Cultures obtained x2, positive for S. Aureus, pan-sensitive. Patient has remained afebrile but white count remains elevated with WBC in the 30s. Surgical site does not appear infected per ortho. - S/p amputation 2nd and 3rd R toes 2/19 - Continue IV cefepime renal dosing - PT/OT - Would benefit from mobilization, but pt should be nonweightbearing on the operative foot - If no improvement in BP or overall decline in patient's status, consider soft tissue ultrasound to further evaluate leg abscess as potential source of infection  #End-stage renal disease on HD Receives hemodialysis MWF at [facility]. - Dialysis per nephro - Nephro following, appreciate recs - Renal/carb diet   #T2DM:HbA1c 11.4% 02/26/18. 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 Home regimen is 50 units Levemir twice daily. Unclear if she is compliant with this at home given good response to insulin regimen here.  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 Was receiving treatment as outpatient.  - Nephro following, appreciate recs - Continue home calcitriol and auryxia  #HFpEF: Last echo 04/12/2015 with Grade 1 diastolic dysfunction, EF 94-76%. Patient with 1+ peripheral edema on exam but no crackles. Does not take diuretics at home. - Holding home carvedilol as above  #Chronic hypertension: -  holding home BP meds as above  #Hypoalbuminemia Albumin 2.6 on admission. No evidence of liver disease. Urinalysis from 2017 with > 300 protein indicating that there is likely a nephrotic syndrome component to her hypoalbuminemia. Protein calorie malnutrition given poor diet could also be contributing. S/p albumin 12.5 g in the ED. - Pro-Stat and renal MVI supplementation fell off when switching back from ICU, consider restarting - Nephro following, appreciate recs  #Anemia of CKD:  Hgb 10.8 on admission, stable (baseline in the 12's as outpatient). Per nephro, suspect some blood loss with injury. Has not yet required ESA. MCV elevated to 109 suggesting B12 or folate deficiency. - Daily CBCs - F/U B12, folate  #Hyperlipidemia: Most recent lipid panel April 2019 showing low cholesterol at 94, low HDL at 32, and other values within normal limits. - Continue atorvastatin 80 mg  #GERD: Patient asymptomatic at this time, usually takes Tums at home. -Monitor for acid reflux symptoms.   FEN/GI: Carb/renal diet PPx: heparin  Disposition: Step-down  Subjective:  Patient reports feeling just a little bit of chest pain. She endorses one episode of watery, foul-smelling diarrhea overnight. She is breathing comfortably. No complaints.  Objective: Temp:  [97.7 F (36.5 C)-99.3 F (37.4 C)] 99.3 F (37.4 C) (02/28 0700) Pulse Rate:  [77-94] 89 (02/28 0900) Resp:  [16-26] 20 (02/28 0900) BP: (83-136)/(48-98) 95/52 (02/28 0900) SpO2:  [91 %-100 %] 93 % (02/28 0900) Weight:  [93.6 kg] 93.6 kg (02/27 1250)   Physical Exam: General: lying in bed on HFNC, conversing appropriately Cardiovascular: RRR Respiratory: normal work of breathing on HFNC, lungs with decreased air movement throughout, no crackles or wheezing Abdomen: soft, nontender Extremities: trace edema  Neuro: A&Ox4  Laboratory: Recent Labs  Lab 03/06/18 0845  03/07/18 0543 03/07/18 0949 03/08/18 0805  WBC 31.8*  --  41.5*   --  32.8*  HGB 10.8*   < > 11.2* 12.9 10.8*  HCT 37.9   < > 38.2 38.0 36.7  PLT 307  --  359  --  358   < > = values in this interval not displayed.   Recent Labs  Lab 03/06/18 0339  03/07/18 0543 03/07/18 0949 03/08/18 0805  NA 133*   < > 133* 133* 132*  K 3.9   < > 4.0 3.1* 4.4  CL 96*  --  96*  --  97*  CO2 24  --  21*  --  24  BUN 41*  --  59*  --  38*  CREATININE 5.78*  --  7.64*  --  6.27*  CALCIUM 8.8*  --  9.1  --  9.1  GLUCOSE 260*  --  252*  --  364*   < > = values in this interval not displayed.      Imaging/Diagnostic Tests: Dg Foot Complete Right  Result Date: 02/25/2018 CLINICAL DATA:  Toe injury. EXAM: RIGHT FOOT COMPLETE - 3+ VIEW COMPARISON:  Radiographs of May 14, 2012. FINDINGS: Moderately displaced fracture is seen involving the proximal portion of the first proximal phalanx with intra-articular extension with some callus formation suggesting subacute fracture. Minimally displaced fracture is seen involving the proximal portion of the second distal phalanx. Soft tissue irregularity or wound is seen involving the distal portion of the second toe. Mild posterior calcaneal spurring is noted. IMPRESSION: Probable subacute moderately displaced fracture is seen involving the first proximal phalanx with intra-articular extension. Probable minimally displaced acute fracture is seen involving proximal portion of the second distal phalanx. Overlying soft tissue irregularity or wound is seen involving the distal soft tissues of the second toe. Electronically Signed   By: Marijo Conception, M.D.   On: 02/25/2018 12:17    I have seen and evaluated the patient with Medical Student Minish. I am in agreement with the note above in its revised form.   Marjie Skiff, MD Family Medicine, PGY-3   Mitchell Heir, Medical Student 03/08/2018, 9:17 AM [place] Intern pager: [phone], text pages welcome

## 2018-03-08 NOTE — Consult Note (Signed)
[facility] Nurse wound consult note Patient receiving care in [facility] [id number].  ANY AND ALL CONCERNS RELATED TO THE SURGICAL FOOT WOUND MUST BE DIRECTED TO THE SURGEON.  [facility] nurses do not change surgeon orders unless specifically consulted to do so by the surgical service. Reason for Consult: Diabetic foot wound.  Primary RN states she thinks this may apply to the surgical foot wound.  She was unaware of the [facility] consult order.  Val Riles, RN, MSN, CWOCN, CNS-BC, pager [phone]

## 2018-03-08 NOTE — Progress Notes (Signed)
Subjective: Interval History: has no complaint feels better..  Objective: Vital signs in last 24 hours: Temp:  [97.2 F (36.2 C)-98.3 F (36.8 C)] 97.7 F (36.5 C) (02/28 0000) Pulse Rate:  [77-94] 81 (02/28 0600) Resp:  [16-26] 24 (02/28 0600) BP: (83-167)/(48-140) 85/50 (02/28 0600) SpO2:  [89 %-100 %] 97 % (02/28 0600) Weight:  [93.6 kg-95.5 kg] 93.6 kg (02/27 1250) Weight change:   Intake/Output from previous day: 02/27 0701 - 02/28 0700 In: 1300.8 [P.O.:1200; IV Piggyback:100.8] Out: 2000  Intake/Output this shift: No intake/output data recorded.  General appearance: alert, cooperative, no distress and morbidly obese Resp: diminished breath sounds bilaterally and rales bibasilar Cardio: S1, S2 normal and systolic murmur: systolic ejection 2/6, crescendo and decrescendo at 2nd left intercostal space GI: obese, pos bs, liver down 6 cm Extremities: edema 2+ and Dressing R foot, AVF RUA  Lab Results: Recent Labs    03/06/18 0845  03/07/18 0543 03/07/18 0949  WBC 31.8*  --  41.5*  --   HGB 10.8*   < > 11.2* 12.9  HCT 37.9   < > 38.2 38.0  PLT 307  --  359  --    < > = values in this interval not displayed.   BMET:  Recent Labs    03/06/18 0339  03/07/18 0543 03/07/18 0949  NA 133*   < > 133* 133*  K 3.9   < > 4.0 3.1*  CL 96*  --  96*  --   CO2 24  --  21*  --   GLUCOSE 260*  --  252*  --   BUN 41*  --  59*  --   CREATININE 5.78*  --  7.64*  --   CALCIUM 8.8*  --  9.1  --    < > = values in this interval not displayed.   No results for input(s): PTH in the last 72 hours. Iron Studies: No results for input(s): IRON, TIBC, TRANSFERRIN, FERRITIN in the last 72 hours.  Studies/Results: Ct Head Wo Contrast  Result Date: 03/06/2018 CLINICAL DATA:  Altered mental status EXAM: CT HEAD WITHOUT CONTRAST TECHNIQUE: Contiguous axial images were obtained from the base of the skull through the vertex without intravenous contrast. COMPARISON:  None. FINDINGS: Brain:  There is no mass, hemorrhage or extra-axial collection. The size and configuration of the ventricles and extra-axial CSF spaces are normal. There is hypoattenuation of the white matter, most commonly indicating chronic small vessel disease. Hyperdensity along both frontal convexities is favored to be streak artifact from calvarium. Vascular: No abnormal hyperdensity of the major intracranial arteries or dural venous sinuses. No intracranial atherosclerosis. Skull: The visualized skull base, calvarium and extracranial soft tissues are normal. Sinuses/Orbits: Right maxillary retention cyst. The orbits are normal. IMPRESSION: 1. No acute intracranial abnormality. 2. Chronic small vessel disease. Electronically Signed   By: Ulyses Jarred M.D.   On: 03/06/2018 20:35   Ct Angio Chest Pe W Or Wo Contrast  Result Date: 03/07/2018 CLINICAL DATA:  58 y/o  F; evaluate for pulmonary embolus. EXAM: CT ANGIOGRAPHY CHEST WITH CONTRAST TECHNIQUE: Multidetector CT imaging of the chest was performed using the standard protocol during bolus administration of intravenous contrast. Multiplanar CT image reconstructions and MIPs were obtained to evaluate the vascular anatomy. CONTRAST:  17mL ISOVUE-370 IOPAMIDOL (ISOVUE-370) INJECTION 76% COMPARISON:  03/07/2018 chest radiograph FINDINGS: Cardiovascular: Satisfactory opacification of the pulmonary arteries to the segmental level. No evidence of pulmonary embolism. Moderate cardiomegaly. No pericardial effusion. Aortic calcific atherosclerosis  and coronary artery calcifications. Mediastinum/Nodes: 17 mm calcified nodule within the right lobe of the thyroid gland. No mediastinal or axillary lymphadenopathy. Normal thoracic esophagus. Lungs/Pleura: Bilateral lower lobe consolidation. Trace pleural effusions. No pneumothorax. Upper Abdomen: No acute abnormality. Musculoskeletal: Left ribs 1-6 acute minimally displaced anterior fractures. Right rib 1 posterior and rib 2-6 anterior  minimally displaced rib fractures. Review of the MIP images confirms the above findings. IMPRESSION: 1. No evidence of pulmonary embolus. 2. Bilateral lower lobe consolidation, likely pneumonia. 3. Left ribs 1-6 acute minimally displaced anterior fractures. Right rib 1 posterior and rib 2-6 anterior minimally displaced acute rib fractures. 4. Moderate cardiomegaly.  No pericardial effusion. Electronically Signed   By: Kristine Garbe M.D.   On: 03/07/2018 16:16   Dg Chest Port 1 View  Result Date: 03/07/2018 CLINICAL DATA:  Respiratory distress EXAM: PORTABLE CHEST 1 VIEW COMPARISON:  03/05/2018 FINDINGS: Shallow inspiration. Cardiac enlargement. No vascular congestion. Small bilateral pleural effusions with basilar atelectasis or infiltration. No pneumothorax. Tortuous aorta. IMPRESSION: Cardiac enlargement. Small bilateral pleural effusions with basilar atelectasis or infiltration. Electronically Signed   By: Lucienne Capers M.D.   On: 03/07/2018 02:41    I have reviewed the patient's current medications.  Assessment/Plan: 1 ESRD for HD tomorrow 2 Anemia stable 3 HPTH on binders, vit D 4 Massive obesity 5 DM controlled 6 Arrest ?? Cause 7low bps unexplained except pneumonia ? 8 Anticoag can stop and use DVT prophylaxis 9 ? Pneumonia on AB P HD , mido,  AB, stop hep   LOS: 11 days   Rachel Chaney 03/08/2018,7:44 AM

## 2018-03-08 NOTE — Clinical Social Work Note (Signed)
Clinical Social Work Assessment  Patient Details  Name: Rachel Chaney MRN: [id number] Date of Birth: Oct 02, 1960  Date of referral:  03/08/18               Reason for consult:  Discharge Planning                Permission sought to share information with:  Case Manager Permission granted to share information::  Yes, Verbal Permission Granted  Name::     Alfonso Ramus  Agency::  All SNF's  Relationship::  Spouse  Contact Information:  [phone]  Housing/Transportation Living arrangements for the past 2 months:  [place] of Information:  Patient Patient Interpreter Needed:  None Criminal Activity/Legal Involvement Pertinent to Current Situation/Hospitalization:  No - Comment as needed Significant Relationships:  Spouse Lives with:  Self Do you feel safe going back to the place where you live?    Need for family participation in patient care:  No (Coment)  Care giving concerns:    Patient has been recommended by PT/OT for SNF. Patient is dependent with mobility and recently recovering from a toe amputation. Patient's husband expressed concerned that his wife had lost consciousness during her dialysis from a lack of oxygen. He is concerned that staff was not paying much attention to his wife. He is concerned that they do not know the extent of the damage that was done during the loss of oxygen.    Social Worker assessment / plan:    CSW met with the patient and husband at bedside. Patient was alert and oriented. CSW introduced herself and explained her role. The patient has not been to a skilled nursing facility in the past. CSW explained skilled nursing and the process. The patient and her husband are agreeable to skilled nursing. CSW received permission to fax the patient out to all local SNF's. The family is not familiar with any agencies locally. CSW provided a CMS SNF list and encouraged the patient and her husband to pick 2-3 facilities. CSW also encouraged  the family to do their own research. CSW stated that someone would be in over the weekend to get facility choices/give bed offers. The patient and her husband had no other concerns at this time.   Employment status:  Retired Forensic scientist:  Medicare PT Recommendations:  [place] / Referral to community resources:  [place]  Patient/Family's Response to care:  Patient's husband is understanding of his wife's recovery process and recent surgery. Patient's husband still has concerns about what happened to his wife during her dialysis treatment.   Patient/Family's Understanding of and Emotional Response to Diagnosis, Current Treatment, and Prognosis:  Patient's husband is very compassionate and is caring for his wife during her recovery process.   Emotional Assessment Appearance:  Appears stated age Attitude/Demeanor/Rapport:  Unable to Assess Affect (typically observed):  Unable to Assess Orientation:  Oriented to Self, Oriented to Place, Oriented to  Time, Oriented to Situation Alcohol / Substance use:  Not Applicable Psych involvement (Current and /or in the community):  No (Comment)  Discharge Needs  Concerns to be addressed:  Discharge Planning Concerns Readmission within the last 30 days:  No Current discharge risk:  Dependent with Mobility Barriers to Discharge:  Continued Medical Work up   [employer], [place] 03/08/2018, 2:33 PM

## 2018-03-08 NOTE — Progress Notes (Signed)
Subjective: 9 Days Post-Op Procedure(s) (LRB): RIGHT FOOT 2ND AND 3RD RAY AMPUTATION (Right) Patient reports pain as mild.    Objective: Vital signs in last 24 hours: Temp:  [97.6 F (36.4 C)-99.3 F (37.4 C)] 99.3 F (37.4 C) (02/28 0700) Pulse Rate:  [77-94] 81 (02/28 0600) Resp:  [16-26] 24 (02/28 0600) BP: (83-167)/(48-140) 85/50 (02/28 0600) SpO2:  [89 %-100 %] 97 % (02/28 0600) Weight:  [93.6 kg-95.5 kg] 93.6 kg (02/27 1250)  Intake/Output from previous day: 02/27 0701 - 02/28 0700 In: 1300.8 [P.O.:1200; IV Piggyback:100.8] Out: 2000  Intake/Output this shift: No intake/output data recorded.  Recent Labs    03/06/18 1148 03/06/18 1807 03/07/18 0007 03/07/18 0543 03/07/18 0949  HGB 11.9* 11.6* 12.6 11.2* 12.9   Recent Labs    03/06/18 0845  03/07/18 0543 03/07/18 0949  WBC 31.8*  --  41.5*  --   RBC 3.47*  --  3.52*  --   HCT 37.9   < > 38.2 38.0  PLT 307  --  359  --    < > = values in this interval not displayed.   Recent Labs    03/06/18 0339  03/07/18 0543 03/07/18 0949  NA 133*   < > 133* 133*  K 3.9   < > 4.0 3.1*  CL 96*  --  96*  --   CO2 24  --  21*  --   BUN 41*  --  59*  --   CREATININE 5.78*  --  7.64*  --   GLUCOSE 260*  --  252*  --   CALCIUM 8.8*  --  9.1  --    < > = values in this interval not displayed.   Recent Labs    03/06/18 0339  INR 1.2    Patient alert in bed and reports little to no pain over the right foot.  There is maceration and slight dehiscence of the dorsal incision and some peri incisional epidermolysis about the plantar incision, but no purulent drainage, scant bloody drainage. No signs of cellulitis in the foot.   Assessment/Plan: 9 Days Post-Op Procedure(s) (LRB): RIGHT FOOT 2ND AND 3RD RAY AMPUTATION (Right) Up with therapy and continue non weight bearing right foot.  Dry gauze dressings to the right foot BID.  WBC to 41.5K, pending today. ? Bilateral PNA on CT Chest.  Currently on Cefepime, but foot  does not appear cellulitic today.      Erlinda Hong, PA-C 03/08/2018, 8:29 AM  [employer] [phone]

## 2018-03-08 NOTE — Progress Notes (Signed)
Pt currently on 10L of HFNC. Pt will need to be at least weaned down no more than 4L before a SNF facility will be able to accommodate. CSW will continue to follow over the weekend.   Domenic Schwab, MSW, [place]

## 2018-03-09 DIAGNOSIS — Z9981 Dependence on supplemental oxygen: Secondary | ICD-10-CM

## 2018-03-09 DIAGNOSIS — J9690 Respiratory failure, unspecified, unspecified whether with hypoxia or hypercapnia: Secondary | ICD-10-CM

## 2018-03-09 LAB — RENAL FUNCTION PANEL
Albumin: 2.3 g/dL — ABNORMAL LOW (ref 3.5–5.0)
Anion gap: 17 — ABNORMAL HIGH (ref 5–15)
BUN: 56 mg/dL — ABNORMAL HIGH (ref 6–20)
CO2: 16 mmol/L — ABNORMAL LOW (ref 22–32)
Calcium: 9.3 mg/dL (ref 8.9–10.3)
Chloride: 100 mmol/L (ref 98–111)
Creatinine, Ser: 8 mg/dL — ABNORMAL HIGH (ref 0.44–1.00)
GFR calc Af Amer: 6 mL/min — ABNORMAL LOW (ref >60)
GFR calc non Af Amer: 5 mL/min — ABNORMAL LOW (ref >60)
Glucose, Bld: 195 mg/dL — ABNORMAL HIGH (ref 70–99)
Phosphorus: 4.2 mg/dL (ref 2.5–4.6)
Potassium: 4.3 mmol/L (ref 3.5–5.1)
Sodium: 133 mmol/L — ABNORMAL LOW (ref 135–145)

## 2018-03-09 LAB — CBC
HCT: 38.3 % (ref 36.0–46.0)
Hemoglobin: 11.3 g/dL — ABNORMAL LOW (ref 12.0–15.0)
MCH: 31.7 pg (ref 26.0–34.0)
MCHC: 29.5 g/dL — ABNORMAL LOW (ref 30.0–36.0)
MCV: 107.3 fL — ABNORMAL HIGH (ref 80.0–100.0)
Platelets: 304 10*3/uL (ref 150–400)
RBC: 3.57 MIL/uL — ABNORMAL LOW (ref 3.87–5.11)
RDW: 15.8 % — ABNORMAL HIGH (ref 11.5–15.5)
WBC: 34.4 10*3/uL — ABNORMAL HIGH (ref 4.0–10.5)
nRBC: 3.4 % — ABNORMAL HIGH (ref 0.0–0.2)

## 2018-03-09 LAB — GLUCOSE, CAPILLARY
Glucose-Capillary: 105 mg/dL — ABNORMAL HIGH (ref 70–99)
Glucose-Capillary: 190 mg/dL — ABNORMAL HIGH (ref 70–99)
Glucose-Capillary: 154 mg/dL — ABNORMAL HIGH (ref 70–99)
Glucose-Capillary: 159 mg/dL — ABNORMAL HIGH (ref 70–99)

## 2018-03-09 NOTE — Progress Notes (Signed)
RT NOTE:  Pt was taken off of bipap and placed on 10 L HFNC. Pt is comfortable and stable at this time.

## 2018-03-09 NOTE — Progress Notes (Signed)
Patient transferred to Hemodialysis.  RN accompanied to drop off.

## 2018-03-09 NOTE — Progress Notes (Signed)
Family Medicine Teaching Service Daily Progress Note Intern Pager: [phone]  Patient name: Rachel Chaney Medical record number: [id number] Date of birth: 10-12-1960 Age: 58 y.o. Gender: female  Primary Care Provider: Bufford Lope, DO Consultants: Nephrology, Orthopedics, CCM Code Status: Full code  Pt Overview and Major Events to Date:  2/17 > admit 2/19 > Toe amputation on right foot 2/20 > Cardiac arrest, intubation, transferred to ICU 2/20 > started on heparin gtt empirically for presumed PE (IV team noted thrombus in RUE cephalic vein) 3/50: Extubated 2/22: Nursing staff reporting more lethargic, had been transferred to medicine service, new hypercarbic respiratory failure.  Placed on BiPAP 2/23: Remains off blood pressure medications for several days now in spite of what it says on MAR.  Hemodynamically stable.  Much more awake.  Suspect episode on 2/23 primarily due to hypercarbia. 2/26: AMS with persistent hypotension. Started midodrine. 2/27: CTA demonstrates bilateral consolidation c/f PNA, no PE. D/C anticoagulation.   Antibiotic course: Vanc (2/17- 2/21) Ceftriaxone (2/17-2/21) Ancef (2/17; 2/19; 2/21-2/26) Metronidazole (2/21-2/24) Cefepime (2/26 - )   Assessment and Plan: Rachel Chaney a 57 y.o.femalepresenting with painful 2nd toe of R foot. PMH is significant forESRD on HD MWF,T2DM,HTN, GERD, CHF, and dyslipidemia.  #Possible sepsis w/ hypotension secondary to ?ventilator associated pneumonia, improved On 2/27, CTA concerning for PNA. Patient recently intubated in ICU due to cardiac arrest, raising concern for possible VAP. Overall, clinically improving with BP up to 133/59 and no hypotension overnight. Patient used BiPAP overnight and satting well on 10 L O2 HFNC this AM. This is less requirement than yesterday where patient was up to 15 L O2 HFNC. - AM CBC pending - holding home hydral, carvedilol due to hypotension - Continue midodrine 10 mg tid -  Continue Cefepime IV, renally dosed, plan for 7 more days (until 3/5), improved tomorrow, consider transition to PO antibiotics, possibly Levaquin.  - Consider broadening antibiotics to vancomycin if respiratory status worsens  #Acute hypoxic and hypercarbic respiratory failure, improved Multifactorial with likely underlying OSA, OHS, and bilateral pneumonia noted on CTA concerning for PNA. Treating pneumonia as above.  - Wean O2, titrate for sat 88-92% - Mandatory BiPap at night - Would benefit from mobilization, but pt should be nonweightbearing on the operative foot - pulmonary toilet - PT/OT recommending SNF - Sleep study as outpatient   #Multiple rib fractures From CPR on 2/20. Left ribs 1-6 acute minimally displaced anterior fractures. Right rib posterior and rib 2-6 anterior minimally displaced rib fractures. Patient endorses minimal pain. - Continue voltaren gel - Schedule Tylenol q6h  - Avoid PO NSAIDs - Avoid opioids given sedative effect  #S/p cardiac arrest 2/20 - unknown etiology S/P cardiac arrest 2/20 with unclear etiology. PE ruled out by CTA. Recent TTE showed LVEF 65-70%, severe LVH, with normal wall motion, grade1 DD, dilated IVC that does not collapse.  - Telemetry - Full code status  #Right second toe fractureOPEN, soft tissue infection, concerning for osteomyelitis: Patient w/ R toe open fx, soft tissue infection and osteo In the setting of uncontrolled diabetes now s/p amputation of 2nd and 3rd right toe on 2/19. Was found to have large purulent abscess extending to the midfoot. Cultures obtained x2, positive for S. Aureus, pan-sensitive. Patient has remained afebrile but white count remains elevated with WBC in the 30s. Surgical site does not appear infected per ortho. - S/p amputation 2nd and 3rd R toes 2/19 - Continue IV cefepime renal dosing - PT/OT - Would benefit from  mobilization, but pt should be nonweightbearing on the operative foot - If no improvement  in BP or overall decline in patient's status, consider soft tissue ultrasound to further evaluate leg abscess as potential source of infection  #End-stage renal disease on HD Receives hemodialysis MWF at [facility]. - Dialysis per nephro - Nephro following, appreciate recs - Renal/carb diet   #T2DM:HbA1c 11.4% 02/26/18. 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 Home regimen is 50 units Levemir twice daily. Unclear if she is compliant with this at home given good response to insulin regimen here. CBGs past 24 hrs 75-364. Likely more elevated due to 2 doses of Fludrocortisone for BP assistance. Used 25 units Lantus, 37 units insulin aspart. Requirement is likely to decrease given no longer getting steroids.  - Continue Lantus to 25 u  - Continue resistant SSI - CBG monitoring 4 times daily - To encourage outpatient compliance, consider continuing Lantus for once daily dosing or oral sulfonylurea on discharge  #Diarrhea, improved C diff negative. Afebrile. Not felt to be infectious. Likely secondary to prolonged course of antibiotics.  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 Was receiving treatment as outpatient.  - Nephro following, appreciate recs - Continue home calcitriol and auryxia  #HFpEF: Recent TTE showed LVEF 65-70%, severe LVH, with normal wall motion, grade1 DD, moderately dilated RV with moderately reduced systolic function, moderate RAE, mild LAE, moderate TR, RVSP 59 mmHg, dilated IVC that does not collapse.  - Holding home carvedilol as above  #Chronic hypertension: Normotensive.  - holding home BP meds in setting of recent hypotension  #Hypoalbuminemia Albumin 2.6 on admission. No evidence of liver disease. Urinalysis from 2017 with > 300 protein indicating that there is likely a nephrotic syndrome component to her hypoalbuminemia. Protein calorie malnutrition given poor diet could also be  contributing. . - Nephro following, appreciate recs - Nutrition consult  #Anemia of CKD:  BL 12. Today's CBC pending. Has not yet required ESA. Patient has macrocytic element. Folate wnl, B12 elevated due to supplamentation - Daily CBCs - smear review pending  #Hyperlipidemia: Most recent lipid panel April 2019 showing low cholesterol at 94, low HDL at 32, and other values within normal limits. - Continue atorvastatin 80 mg  #GERD: Patient asymptomatic at this time, usually takes Tums at home. -Protonix 40 daily -Mylanta prn  FEN/GI: Carb/renal diet PPx: heparin  Disposition: improved, transfer to medical telemetry  Subjective:  Patient at HD this AM. No complaints of CP. States breathing is improved.   Objective: Temp:  [97.9 F (36.6 C)-98.8 F (37.1 C)] 98.8 F (37.1 C) (02/29 0800) Pulse Rate:  [77-96] 94 (02/29 1000) Resp:  [14-79] 21 (02/29 1000) BP: (84-133)/(30-72) 133/59 (02/29 1000) SpO2:  [92 %-100 %] 94 % (02/29 1000)   Physical Exam: General: lying in bed on HFNC, conversing appropriately Cardiovascular: RRR Respiratory: normal work of breathing on HFNC, lungs with decreased air movement throughout, no crackles or wheezing Abdomen: soft, nontender Extremities: trace edema Neuro: A&Ox4  Laboratory: Recent Labs  Lab 03/06/18 0845  03/07/18 0543 03/07/18 0949 03/08/18 0805  WBC 31.8*  --  41.5*  --  32.8*  HGB 10.8*   < > 11.2* 12.9 10.8*  HCT 37.9   < > 38.2 38.0 36.7  PLT 307  --  359  --  358   < > = values in this interval not displayed.   Recent Labs  Lab 03/06/18 [phone]  03/07/18 0543 03/07/18  1657 03/08/18 0805  NA 133*   < > 133* 133* 132*  K 3.9   < > 4.0 3.1* 4.4  CL 96*  --  96*  --  97*  CO2 24  --  21*  --  24  BUN 41*  --  59*  --  38*  CREATININE 5.78*  --  7.64*  --  6.27*  CALCIUM 8.8*  --  9.1  --  9.1  GLUCOSE 260*  --  252*  --  364*   < > = values in this interval not displayed.    Imaging/Diagnostic Tests: Dg  Foot Complete Right  Result Date: 02/25/2018 CLINICAL DATA:  Toe injury. EXAM: RIGHT FOOT COMPLETE - 3+ VIEW COMPARISON:  Radiographs of May 14, 2012. FINDINGS: Moderately displaced fracture is seen involving the proximal portion of the first proximal phalanx with intra-articular extension with some callus formation suggesting subacute fracture. Minimally displaced fracture is seen involving the proximal portion of the second distal phalanx. Soft tissue irregularity or wound is seen involving the distal portion of the second toe. Mild posterior calcaneal spurring is noted. IMPRESSION: Probable subacute moderately displaced fracture is seen involving the first proximal phalanx with intra-articular extension. Probable minimally displaced acute fracture is seen involving proximal portion of the second distal phalanx. Overlying soft tissue irregularity or wound is seen involving the distal soft tissues of the second toe. Electronically Signed   By: Marijo Conception, M.D.   On: 02/25/2018 12:17    Bonnita Hollow, MD 03/09/2018, 11:00 AM [place] Intern pager: [phone], text pages welcome

## 2018-03-09 NOTE — Progress Notes (Signed)
Subjective: Interval History: has no complaint .  Objective: Vital signs in last 24 hours: Temp:  [97.9 F (36.6 C)-98.7 F (37.1 C)] 98.7 F (37.1 C) (02/29 0400) Pulse Rate:  [77-105] 83 (02/29 0700) Resp:  [14-79] 17 (02/29 0700) BP: (84-127)/(40-92) 127/49 (02/29 0700) SpO2:  [86 %-100 %] 98 % (02/29 0700) Weight change:   Intake/Output from previous day: 02/28 0701 - 02/29 0700 In: 920 [P.O.:720; IV Piggyback:200] Out: -  Intake/Output this shift: No intake/output data recorded.  General appearance: alert, cooperative, no distress, morbidly obese and On BIPAP Resp: diminished breath sounds bilaterally and rales and rhonchi in bases Cardio: S1, S2 normal and systolic murmur: systolic ejection 2/6, crescendo and decrescendo at 2nd left intercostal space GI: obese, pos bs, liver down 6 cm Extremities: dressing R foot, AVF LUA  Lab Results: Recent Labs    03/07/18 0543 03/07/18 0949 03/08/18 0805  WBC 41.5*  --  32.8*  HGB 11.2* 12.9 10.8*  HCT 38.2 38.0 36.7  PLT 359  --  358   BMET:  Recent Labs    03/07/18 0543 03/07/18 0949 03/08/18 0805  NA 133* 133* 132*  K 4.0 3.1* 4.4  CL 96*  --  97*  CO2 21*  --  24  GLUCOSE 252*  --  364*  BUN 59*  --  38*  CREATININE 7.64*  --  6.27*  CALCIUM 9.1  --  9.1   No results for input(s): PTH in the last 72 hours. Iron Studies: No results for input(s): IRON, TIBC, TRANSFERRIN, FERRITIN in the last 72 hours.  Studies/Results: Ct Angio Chest Pe W Or Wo Contrast  Result Date: 03/07/2018 CLINICAL DATA:  58 y/o  F; evaluate for pulmonary embolus. EXAM: CT ANGIOGRAPHY CHEST WITH CONTRAST TECHNIQUE: Multidetector CT imaging of the chest was performed using the standard protocol during bolus administration of intravenous contrast. Multiplanar CT image reconstructions and MIPs were obtained to evaluate the vascular anatomy. CONTRAST:  96mL ISOVUE-370 IOPAMIDOL (ISOVUE-370) INJECTION 76% COMPARISON:  03/07/2018 chest radiograph  FINDINGS: Cardiovascular: Satisfactory opacification of the pulmonary arteries to the segmental level. No evidence of pulmonary embolism. Moderate cardiomegaly. No pericardial effusion. Aortic calcific atherosclerosis and coronary artery calcifications. Mediastinum/Nodes: 17 mm calcified nodule within the right lobe of the thyroid gland. No mediastinal or axillary lymphadenopathy. Normal thoracic esophagus. Lungs/Pleura: Bilateral lower lobe consolidation. Trace pleural effusions. No pneumothorax. Upper Abdomen: No acute abnormality. Musculoskeletal: Left ribs 1-6 acute minimally displaced anterior fractures. Right rib 1 posterior and rib 2-6 anterior minimally displaced rib fractures. Review of the MIP images confirms the above findings. IMPRESSION: 1. No evidence of pulmonary embolus. 2. Bilateral lower lobe consolidation, likely pneumonia. 3. Left ribs 1-6 acute minimally displaced anterior fractures. Right rib 1 posterior and rib 2-6 anterior minimally displaced acute rib fractures. 4. Moderate cardiomegaly.  No pericardial effusion. Electronically Signed   By: Kristine Garbe M.D.   On: 03/07/2018 16:16    I have reviewed the patient's current medications.  Assessment/Plan: 1 ESRD for HD,  2 Anemia stable 3 HPTH vit D 4 Dm controlled 5 Pneu on AB 6 low bp improved with mido, no role for Fluorinef with ESRD 7 Morbid obesity ??plans 8 PVD post TMA 9 OSA P HD, AB, BIPAP    LOS: 12 days   Jeneen Rinks Rockney Grenz 03/09/2018,7:37 AM

## 2018-03-09 NOTE — Progress Notes (Signed)
Pt tolerated HD tx well; she alert and orientedx4; answers questions appropriately; report given to De Nurse, RN.

## 2018-03-09 NOTE — Progress Notes (Signed)
Nutrition Brief Note  RD consulted for nutritional assessment. Per consultant note, this appears to be related to hypoalbuminemia. Pt had been assessed by RD 48 hrs prior- please see that note for further details. Oral supplements were ordered at that time. Pt documented as consuming the Prostat. Her ensure was discontinued w/ documentation showing none of these were given- reason being "pt eating".   No acute needs identified at this time.   Burtis Junes RD, LDN, CNSC Clinical Nutrition Available Tues-Sat via Pager: [phone] 03/09/2018 6:54 PM

## 2018-03-10 ENCOUNTER — Inpatient Hospital Stay (HOSPITAL_COMMUNITY): Payer: Medicare Other

## 2018-03-10 DIAGNOSIS — M549 Dorsalgia, unspecified: Secondary | ICD-10-CM

## 2018-03-10 DIAGNOSIS — I480 Paroxysmal atrial fibrillation: Secondary | ICD-10-CM

## 2018-03-10 LAB — GLUCOSE, CAPILLARY
Glucose-Capillary: 163 mg/dL — ABNORMAL HIGH (ref 70–99)
Glucose-Capillary: 153 mg/dL — ABNORMAL HIGH (ref 70–99)
Glucose-Capillary: 194 mg/dL — ABNORMAL HIGH (ref 70–99)
Glucose-Capillary: 222 mg/dL — ABNORMAL HIGH (ref 70–99)

## 2018-03-10 LAB — RENAL FUNCTION PANEL
Albumin: 2.4 g/dL — ABNORMAL LOW (ref 3.5–5.0)
Anion gap: 21 — ABNORMAL HIGH (ref 5–15)
BUN: 32 mg/dL — ABNORMAL HIGH (ref 6–20)
CO2: 18 mmol/L — ABNORMAL LOW (ref 22–32)
Calcium: 9.1 mg/dL (ref 8.9–10.3)
Chloride: 98 mmol/L (ref 98–111)
Creatinine, Ser: 5.3 mg/dL — ABNORMAL HIGH (ref 0.44–1.00)
GFR calc Af Amer: 10 mL/min — ABNORMAL LOW (ref >60)
GFR calc non Af Amer: 8 mL/min — ABNORMAL LOW (ref >60)
Glucose, Bld: 170 mg/dL — ABNORMAL HIGH (ref 70–99)
Phosphorus: 4.7 mg/dL — ABNORMAL HIGH (ref 2.5–4.6)
Potassium: 3.7 mmol/L (ref 3.5–5.1)
Sodium: 137 mmol/L (ref 135–145)

## 2018-03-10 LAB — CBC
HCT: 37.5 % (ref 36.0–46.0)
Hemoglobin: 11.1 g/dL — ABNORMAL LOW (ref 12.0–15.0)
MCH: 31.6 pg (ref 26.0–34.0)
MCHC: 29.6 g/dL — ABNORMAL LOW (ref 30.0–36.0)
MCV: 106.8 fL — ABNORMAL HIGH (ref 80.0–100.0)
Platelets: 274 10*3/uL (ref 150–400)
RBC: 3.51 MIL/uL — ABNORMAL LOW (ref 3.87–5.11)
RDW: 16.1 % — ABNORMAL HIGH (ref 11.5–15.5)
WBC: 32.4 10*3/uL — ABNORMAL HIGH (ref 4.0–10.5)
nRBC: 9.2 % — ABNORMAL HIGH (ref 0.0–0.2)

## 2018-03-10 MED ORDER — SODIUM CHLORIDE 0.9 % IV BOLUS
500.0000 mL | Freq: Once | INTRAVENOUS | Status: AC
  Administered 2018-03-10: 500 mL via INTRAVENOUS

## 2018-03-10 NOTE — Progress Notes (Signed)
Family Medicine Teaching Service Daily Progress Note Intern Pager: [phone]  Patient name: Rachel Chaney Medical record number: [id number] Date of birth: [birth year] Age: 58 y.o. Gender: female  Primary Care Provider: Bufford Lope, DO Consultants: Nephrology, Orthopedics, CCM Code Status: Full code  Pt Overview and Major Events to Date:  2/17 > admit 2/19 > Toe amputation on right foot 2/20 > Cardiac arrest, intubation, transferred to ICU 2/20 > started on heparin gtt empirically for presumed PE (IV team noted thrombus in RUE cephalic vein) 0/27: Extubated 2/22: Nursing staff reporting more lethargic, had been transferred to medicine service, new hypercarbic respiratory failure.  Placed on BiPAP 2/23: Remains off blood pressure medications for several days now in spite of what it says on MAR.  Hemodynamically stable.  Much more awake.  Suspect episode on 2/23 primarily due to hypercarbia. 2/26: AMS with persistent hypotension. Started midodrine. 2/27: CTA demonstrates bilateral consolidation c/f PNA, no PE. D/C anticoagulation.   Antibiotic course: Vanc (2/17- 2/21) Ceftriaxone (2/17-2/21) Ancef (2/17; 2/19; 2/21-2/26) Metronidazole (2/21-2/24) Cefepime (2/26 - )   Assessment and Plan: Rachel I Dorsettis a 58 y.o.femalepresenting with painful 2nd toe of R foot. PMH is significant forESRD on HD MWF,T2DM,HTN, GERD, CHF, and dyslipidemia.  #Possible sepsis w/ hypotension secondary to ventilator associated pneumonia,  Hypotensive overnight and responded to 500 cc bolus.  Also one recorded episode of atrial fibrillation up to 153/min.  Blood cultures drawn overnight, previous blood cultures demonstrate no growth to date.  In the setting of changing vitals overnight, we will continue with IV antibiotics. - holding home hydral, carvedilol due to hypotension - Continue midodrine 10 mg tid - Continue Cefepime IV, renally dosed, plan for 7 more days (until 3/5), improved tomorrow,  consider transition to PO antibiotics, possibly Levaquin.  - Consider broadening antibiotics to vancomycin if respiratory status worsens   #End-stage renal disease on HD Hypotensive overnight to 82/35.  Given 500 mL bolus at that time, chest x-ray and EKG ordered, blood cultures drawn.  ICU consulted and advised this is likely secondary to hypovolemia from dialysis.  Nephrology contacted and recommended delaying dialysis until Monday.  Midrin given early this morning. - Dialysis per nephro - Nephro following, appreciate recs - Renal/carb diet   #Transient A-fib Documentation of patient vitals on the morning of 3/1 showed atrial fibrillation with a heart rate of 153.  After calling up to the floor, the nurse was unaware that the patient had been tachycardic.  The nurse checked at that time and reported a normal heart rate in sinus rhythm.  This is particularly concerning in the setting of recent hypotension with bradycardia 3 hours earlier. -Continue cardiac monitors -Consider cardiology consult  #Acute hypoxic and hypercarbic respiratory failure, improved Multifactorial with likely underlying OSA, OHS, and bilateral pneumonia noted on CTA concerning for PNA. Treating pneumonia as above.  Decreased oxygen requirement overnight, nasal cannula decreased from 10 to 2 L. - Wean O2, titrate for sat 88-92% - Mandatory BiPap at night - Would benefit from mobilization, but pt should be nonweightbearing on the operative foot - pulmonary toilet - PT/OT recommending SNF - Sleep study as outpatient   #Multiple rib fractures - stable Her only complaint this morning was ongoing back pain. - Continue voltaren gel - Schedule Tylenol q6h  - Avoid PO NSAIDs - Avoid opioids given sedative effect  #S/p cardiac arrest 2/20 - unknown etiology S/P cardiac arrest 2/20 with unclear etiology. PE ruled out by CTA. Recent TTE showed LVEF 65-70%, severe  LVH, with normal wall motion, grade1 DD, dilated IVC that does  not collapse.  - Telemetry - Full code status  #Amputation 2nd and 3rd R toes 2/19 due to concern for osteomyelitis -Antibiotics as above - PT/OT -Encourage OOB  #T2DM:Stable HbA1c 11.4% 02/26/18.  Blood glucose 153-190 overnight.  25 units Lantus given in addition to 5 units glargine administered in the past 24 hours. - Continue Lantus to 25 u  - Continue resistant SSI - CBG monitoring 4 times daily  #Diarrhea, improved Likely secondary to prolonged course of antibiotics.  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 Was receiving treatment as outpatient.  - Nephro following, appreciate recs - Continue home calcitriol and auryxia  #HFpEF: Recent TTE showed LVEF 65-70%, severe LVH, with normal wall motion, grade1 DD, moderately dilated RV with moderately reduced systolic function, moderate RAE, mild LAE, moderate TR, RVSP 59 mmHg, dilated IVC that does not collapse.  - Holding home carvedilol as above  #Chronic hypertension: Normotensive.  - holding home BP meds in setting of recent hypotension  #Hypoalbuminemia Albumin 2.6 on admission. No evidence of liver disease. Urinalysis from 2017 with > 300 protein indicating that there is likely a nephrotic syndrome component to her hypoalbuminemia. Protein calorie malnutrition given poor diet could also be contributing. . - Nephro following, appreciate recs - Nutrition consult  #Anemia of CKD:  BL 12. Today's CBC pending. Has not yet required ESA. Patient has macrocytic element. Folate wnl, B12 elevated due to supplamentation - Daily CBCs - smear review pending  #Hyperlipidemia: Most recent lipid panel April 2019 showing low cholesterol at 94, low HDL at 32, and other values within normal limits. - Continue atorvastatin 80 mg  #GERD: Patient asymptomatic at this time, usually takes Tums at home. -Protonix 40 daily -Mylanta prn  FEN/GI: Carb/renal diet PPx:  heparin  Disposition: improved, transfer to medical telemetry  Subjective:  Overnight, she is found to be significantly hypotensive.  A 500 cc bolus was administered, Midrin was administered early, chest x-ray ordered and EKG performed.   Around 8 AM, she is found to be tachycardic up to 150 in atrial fibrillation per the chart.  After calling up to the floor, the nurse was unaware and reported that the patient was demonstrating a normal sinus rhythm with a normal heart rate.  Her main concern this morning is her back pain that was significantly bothering her overnight.  She would like to be more mobile and have more opportunities to be out of bed.  Her husband had several questions about her mental status and motor function would continue to improve following her cardiac arrest.  He was surprised to learn that she would likely not experience full recovery.  Objective: Temp:  [97.4 F (36.3 C)-98.8 F (37.1 C)] 98 F (36.7 C) (03/01 0515) Pulse Rate:  [21-98] 88 (03/01 0515) Resp:  [16-26] 18 (03/01 0515) BP: (82-148)/(30-118) 82/35 (03/01 0515) SpO2:  [93 %-100 %] 93 % (03/01 0515) Weight:  [99.1 kg-101.3 kg] 99.1 kg (02/29 2057)   Physical Exam: General: Sitting in bed comfortably in no acute distress.  Accompanied by her husband.  Mentation was slow, affect normal. HEENT: Neck non-tender without lymphadenopathy.  Difficult to assess JVD. Cardio: Normal S1 and S2, no S3 or S4. Rhythm is regular. No murmurs or rubs.   Pulm: Nasal cannula in place, breathing comfortably on 2 L.  Lateral auscultation revealed moderate air movement bilaterally, difficult to assess further. Abdomen: Bowel sounds  normal. Abdomen soft and non-tender.  Extremities: No peripheral edema. Warm/ well perfused.  Strong radial pulse. Neuro: Cranial nerves grossly intact  Laboratory: Recent Labs  Lab 03/07/18 0543 03/07/18 0949 03/08/18 0805 03/09/18 1124  WBC 41.5*  --  32.8* 34.4*  HGB 11.2* 12.9 10.8*  11.3*  HCT 38.2 38.0 36.7 38.3  PLT 359  --  358 304   Recent Labs  Lab 03/07/18 0543 03/07/18 0949 03/08/18 0805 03/09/18 1124  NA 133* 133* 132* 133*  K 4.0 3.1* 4.4 4.3  CL 96*  --  97* 100  CO2 21*  --  24 16*  BUN 59*  --  38* 56*  CREATININE 7.64*  --  6.27* 8.00*  CALCIUM 9.1  --  9.1 9.3  GLUCOSE 252*  --  364* 195*    Imaging/Diagnostic Tests: ECHO IMPRESSIONS    1. The left ventricle has hyperdynamic systolic function, with an ejection fraction of >65%. The cavity size was normal. There is severely increased left ventricular wall thickness. Left ventricular diastolic Doppler parameters are consistent with  impaired relaxation Indeterminent filling pressures The E/e' is 8-15.  2. The right ventricle has moderately reduced systolic function. The cavity was moderately enlarged. There is no increase in right ventricular wall thickness.  3. Left atrial size was mildly dilated.  4. Right atrial size was moderately dilated.  5. The mitral valve is degenerative. There is mild to moderate mitral annular calcification present.  6. Moderate tricuspid stenosis.  7. The tricuspid valve is normal in structure.  8. The aortic valve was not well visualized.  9. The aortic root and ascending aorta are normal in size and structure. 10. The inferior vena cava was dilated in size with <50% respiratory variability. 11. The interatrial septum was not well visualized. 12. When compared to the prior study: LVEF >65% on 03/01/2018.   Matilde Haymaker, MD 03/10/2018, [place] Intern pager: [phone], text pages welcome

## 2018-03-10 NOTE — Progress Notes (Signed)
Pt wanted CPAP off, stated she did not want to put it back on. Put oxygen at 2LPM via [place].   Eleanora Neighbor, RN

## 2018-03-10 NOTE — Consult Note (Addendum)
Cardiology Consultation:   Patient ID: Rachel Chaney MRN: [id number]; DOB: 10/17/1960  Admit date: 02/25/2018 Date of Consult: 03/10/2018  Primary Care Provider: Bufford Lope, DO Primary Cardiologist: New to [facility]   Patient Profile:   Rachel Chaney is a 58 y.o. female with a hx of ESRD on HD MWF, DM, HTN, HLD and chronic diastolic CHF  who is being seen today for the evaluation of cardiac arrest and possible afib at the request of Dr. Collier Salina.   History of Present Illness:   Ms. Lewis presented 2/17 with R foot pain after crush injury. 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 During dialysis 2/20, patient was found unresponsive. CPR was performed for 6 minutes with ROSC. She received epi x1 and sodium bicarb x 1. She was intubated and transferred to ICU. No clear etiology but could be due to PE from cephalic vein clot or from over-dialysis and volume depletion.  Recommended anticoagulation for 3 months. Treated with heparin. Extubated 2/21. Hospital course further complicated by persistent hypotension, acute hypoxic hypercarbic respiratory failure. Not improved despite BiPAP, holding dialysis and midodrine. On broad spectrum abx due to persistent leukocytosis. CTA of chest 2/27 demonstrated bilateral PNA, no PE. Discontinued anticoagulation as on PE found.   Echo 03/01/18 showed LVEF of >05%, mild diastolic dysfunction, no wall motion abnormality. Mildly reduce RV function.   Repeat echo 03/08/18 showed LVEF of >39%, mild diastolic dysfunction, moderately reduced RV function.   Patient had transient tachycardia this morning at 8:18 am seem afib at 140s, otherwise sinus rhythm at 80-90s - personally reviewed.   EKG this morning showed Sinus rhythm at rate of 81 bpm with non specific TWI inferiorly which is new compared to prior EKG of 2/26 & 2/19- personally reviewed.   No family at bedside. Hx obtained from review of chart. Patient is severely lethargic.    Past Medical History:  Diagnosis Date  . CHF (congestive heart failure) ([place])   . Chronic kidney disease   . Chronic kidney disease due to diabetes mellitus ([place]) 10/13/2013  . Diabetes mellitus   . Hyperlipidemia   . Hypertension     Past Surgical History:  Procedure Laterality Date  . AMPUTATION Right 02/27/2018   Procedure: RIGHT FOOT 2ND AND 3RD RAY AMPUTATION;  Surgeon: [name], MD;  Location: [place];  Service: Orthopedics;  Laterality: Right;  . AV FISTULA PLACEMENT Left 04/15/2015   Procedure: ARTERIOVENOUS (AV) FISTULA CREATION;  Surgeon: Mal Misty, MD;  Location: [place];  Service: Vascular;  Laterality: Left;  . EYE SURGERY Right    retinal detachment  . FISTULA SUPERFICIALIZATION Left 06/03/2015   Procedure: LEFT UPPER ARM BRACHIOCEPHALIC FISTULA SUPERFICIALIZATION;  Surgeon: Mal Misty, MD;  Location: [place];  Service: Vascular;  Laterality: Left;  from MAC to General     Inpatient Medications: Scheduled Meds: . atorvastatin  80 mg Per Tube Daily  . calcitRIOL  0.25 mcg Oral Daily  . chlorhexidine gluconate (MEDLINE KIT)  15 mL Mouth Rinse BID  . Chlorhexidine Gluconate Cloth  6 each Topical Q0600  . Chlorhexidine Gluconate Cloth  6 each Topical Q0600  . Chlorhexidine Gluconate Cloth  6 each Topical Q0600  . diclofenac sodium  4 g Topical QID  . feeding supplement (PRO-STAT SUGAR FREE 64)  30 mL Oral BID  . ferric citrate  420 mg Oral TID WC  . heparin injection (subcutaneous)  5,000 Units Subcutaneous Q8H  . insulin  aspart  0-20 Units Subcutaneous TID WC  . insulin glargine  25 Units Subcutaneous Daily  . lactobacillus acidophilus  2 tablet Oral TID  . midodrine  10 mg Oral TID WC  . multivitamin  1 tablet Oral QHS  . pantoprazole  40 mg Oral Daily   Continuous Infusions: . sodium chloride Stopped (03/06/18 1911)  . sodium chloride    . sodium chloride    . ceFEPime (MAXIPIME) IV Stopped (03/08/18 1816)   PRN Meds: Place/Maintain arterial line  **AND** sodium chloride, acetaminophen, alum & mag hydroxide-simeth, ondansetron **OR** ondansetron (ZOFRAN) IV  Allergies:   No Known Allergies  Social History:   Social History   Socioeconomic History  . Marital status: Married    Spouse name: Not on file  . Number of children: Not on file  . Years of education: Not on file  . Highest education level: Not on file  Occupational History  . Not on file  Social Needs  . Financial resource strain: Not on file  . Food insecurity:    Worry: Not on file    Inability: Not on file  . Transportation needs:    Medical: Not on file    Non-medical: Not on file  Tobacco Use  . Smoking status: Never Smoker  . Smokeless tobacco: Never Used  Substance and Sexual Activity  . Alcohol use: No    Alcohol/week: 0.0 standard drinks  . Drug use: No  . Sexual activity: Never  Lifestyle  . Physical activity:    Days per week: Not on file    Minutes per session: Not on file  . Stress: Not on file  Relationships  . Social connections:    Talks on phone: Not on file    Gets together: Not on file    Attends religious service: Not on file    Active member of club or organization: Not on file    Attends meetings of clubs or organizations: Not on file    Relationship status: Not on file  . Intimate partner violence:    Fear of current or ex partner: Not on file    Emotionally abused: Not on file    Physically abused: Not on file    Forced sexual activity: Not on file  Other Topics Concern  . Not on file  Social History Narrative  . Not on file    Family History:   Family History  Problem Relation Age of Onset  . Heart disease Mother   . Heart disease Father      ROS:  Please see the history of present illness.  All other ROS reviewed and negative.     Physical Exam/Data:   Vitals:   03/09/18 1845 03/09/18 2057 03/10/18 0515 03/10/18 1050  BP: 107/65 (!) 113/47 (!) 82/35 (!) 104/52  Pulse: 96 95 88 88  Resp:  _0 Temp:  98.1 F (36.7 C) 98.5 F (36.9 C) 98 F (36.7 C) 98.4 F (36.9 C)  TempSrc: Oral Oral Oral Oral  SpO2: 97% 100% 93% 100%  Weight:  99.1 kg    Height:        Intake/Output Summary (Last 24 hours) at 03/10/2018 1450 Last data filed at 03/10/2018 1048 Gross per 24 hour  Intake 570.75 ml  Output 2000 ml  Net -1429.25 ml   Last 3 Weights 03/09/2018 03/09/2018 03/09/2018  Weight (lbs) 218 lb 7.6 oz 218 lb 7.6 oz 223 lb 5.2 oz  Weight (kg) 99.1 kg 99.1  kg 101.3 kg     Body mass index is 37.5 kg/m.  General:  Chronically ill appearing obese female in no acute distress HEENT: normal Lymph: no adenopathy Neck: no JVD Endocrine:  No thryomegaly Vascular: No carotid bruits; FA pulses 2+ bilaterally without bruits  Cardiac:  normal S1, S2; RRR; no murmur  Lungs:  clear to auscultation bilaterally, no wheezing, rhonchi or rales  Abd: soft, nontender, no hepatomegaly  Ext: no edema Musculoskeletal:  R 2nd 3 rd toe amputation with dressing  Skin: warm and dry  Neuro:  lethergic Psych:  lethargic   Relevant CV Studies:  Echo 03/08/2018 1. The left ventricle has hyperdynamic systolic function, with an ejection fraction of >65%. The cavity size was normal. There is severely increased left ventricular wall thickness. Left ventricular diastolic Doppler parameters are consistent with  impaired relaxation Indeterminent filling pressures The E/e' is 8-15.  2. The right ventricle has moderately reduced systolic function. The cavity was moderately enlarged. There is no increase in right ventricular wall thickness.  3. Left atrial size was mildly dilated.  4. Right atrial size was moderately dilated.  5. The mitral valve is degenerative. There is mild to moderate mitral annular calcification present.  6. Moderate tricuspid stenosis.  7. The tricuspid valve is normal in structure.  8. The aortic valve was not well visualized.  9. The aortic root and ascending aorta are normal in size and  structure. 10. The inferior vena cava was dilated in size with <50% respiratory variability. 11. The interatrial septum was not well visualized. 12. When compared to the prior study: LVEF >65% on 03/01/2018.  Laboratory Data:  Chemistry Recent Labs  Lab 03/08/18 0805 03/09/18 1124 03/10/18 0751  NA 132* 133* 137  K 4.4 4.3 3.7  CL 97* 100 98  CO2 24 16* 18*  GLUCOSE 364* 195* 170*  BUN 38* 56* 32*  CREATININE 6.27* 8.00* 5.30*  CALCIUM 9.1 9.3 9.1  GFRNONAA 7* 5* 8*  GFRAA 8* 6* 10*  ANIONGAP 11 17* 21*    Recent Labs  Lab 03/07/18 0543 03/09/18 1124 03/10/18 0751  ALBUMIN 2.3* 2.3* 2.4*   Hematology Recent Labs  Lab 03/08/18 0805 03/09/18 1124 03/10/18 0751  WBC 32.8* 34.4* 32.4*  RBC 3.44* 3.57* 3.51*  HGB 10.8* 11.3* 11.1*  HCT 36.7 38.3 37.5  MCV 106.7* 107.3* 106.8*  MCH 31.4 31.7 31.6  MCHC 29.4* 29.5* 29.6*  RDW 15.3 15.8* 16.1*  PLT 358 304 274   Cardiac Enzymes Recent Labs  Lab 03/06/18 0758  TROPONINI 0.13*   BNP Recent Labs  Lab 03/08/18 0651  BNP 2,084.7*    Radiology/Studies:  Ct Head Wo Contrast  Result Date: 03/06/2018 CLINICAL DATA:  Altered mental status EXAM: CT HEAD WITHOUT CONTRAST TECHNIQUE: Contiguous axial images were obtained from the base of the skull through the vertex without intravenous contrast. COMPARISON:  None. FINDINGS: Brain: There is no mass, hemorrhage or extra-axial collection. The size and configuration of the ventricles and extra-axial CSF spaces are normal. There is hypoattenuation of the white matter, most commonly indicating chronic small vessel disease. Hyperdensity along both frontal convexities is favored to be streak artifact from calvarium. Vascular: No abnormal hyperdensity of the major intracranial arteries or dural venous sinuses. No intracranial atherosclerosis. Skull: The visualized skull base, calvarium and extracranial soft tissues are normal. Sinuses/Orbits: Right maxillary retention cyst. The orbits  are normal. IMPRESSION: 1. No acute intracranial abnormality. 2. Chronic small vessel disease. Electronically Signed   By: Lennette Bihari  Collins [place] M.D.   On: 03/06/2018 20:35   Ct Angio Chest Pe W Or Wo Contrast  Result Date: 03/07/2018 CLINICAL DATA:  58 y/o  F; evaluate for pulmonary embolus. EXAM: CT ANGIOGRAPHY CHEST WITH CONTRAST TECHNIQUE: Multidetector CT imaging of the chest was performed using the standard protocol during bolus administration of intravenous contrast. Multiplanar CT image reconstructions and MIPs were obtained to evaluate the vascular anatomy. CONTRAST:  7m ISOVUE-370 IOPAMIDOL (ISOVUE-370) INJECTION 76% COMPARISON:  03/07/2018 chest radiograph FINDINGS: Cardiovascular: Satisfactory opacification of the pulmonary arteries to the segmental level. No evidence of pulmonary embolism. Moderate cardiomegaly. No pericardial effusion. Aortic calcific atherosclerosis and coronary artery calcifications. Mediastinum/Nodes: 17 mm calcified nodule within the right lobe of the thyroid gland. No mediastinal or axillary lymphadenopathy. Normal thoracic esophagus. Lungs/Pleura: Bilateral lower lobe consolidation. Trace pleural effusions. No pneumothorax. Upper Abdomen: No acute abnormality. Musculoskeletal: Left ribs 1-6 acute minimally displaced anterior fractures. Right rib 1 posterior and rib 2-6 anterior minimally displaced rib fractures. Review of the MIP images confirms the above findings. IMPRESSION: 1. No evidence of pulmonary embolus. 2. Bilateral lower lobe consolidation, likely pneumonia. 3. Left ribs 1-6 acute minimally displaced anterior fractures. Right rib 1 posterior and rib 2-6 anterior minimally displaced acute rib fractures. 4. Moderate cardiomegaly.  No pericardial effusion. Electronically Signed   By: LKristine GarbeM.D.   On: 03/07/2018 16:16   Dg Chest Port 1 View  Result Date: 03/10/2018 CLINICAL DATA:  Back pain and hypotension. EXAM: PORTABLE CHEST 1 VIEW COMPARISON:   03/07/2018 FINDINGS: There is mild cardiac enlargement. Decreased lung volumes. Bilateral lower lobe atelectasis and airspace consolidation is identified. Not significantly improved from previous exam. IMPRESSION: 1. Bilateral lower lobe atelectasis and airspace consolidation. Aeration to lungs not significantly improved from previous exam. Electronically Signed   By: TKerby MoorsM.D.   On: 03/10/2018 13:55   Dg Chest Port 1 View  Result Date: 03/07/2018 CLINICAL DATA:  Respiratory distress EXAM: PORTABLE CHEST 1 VIEW COMPARISON:  03/05/2018 FINDINGS: Shallow inspiration. Cardiac enlargement. No vascular congestion. Small bilateral pleural effusions with basilar atelectasis or infiltration. No pneumothorax. Tortuous aorta. IMPRESSION: Cardiac enlargement. Small bilateral pleural effusions with basilar atelectasis or infiltration. Electronically Signed   By: WLucienne CapersM.D.   On: 03/07/2018 02:41    Assessment and Plan:   1. Paroxysmal atrial fibrillation/Flutter - Brief episode lasted for 5 minutes. CHADSVASC score of 3 for  SeX, HTN, dCHF . On [place] heparin for now.      For questions or updates, please contact CLake SummersetPlease consult [email or web address] for contact info under     Signed,Leanor Kail PA  03/10/2018 2:50 PM   As above, patient seen and examined.  Briefly she is a 58year old female with past medical history of end-stage renal disease dialysis dependent, diabetes mellitus, hypertension, hyperlipidemia, chronic diastolic congestive heart failure for evaluation of brief episode of atrial fibrillation and recent question cardiac arrest.  Patient was admitted on February 17 with right foot pain and had amputation of second and third digits of right foot.  While on dialysis on 2-20 she was said to become unresponsive.  Based on notes no pulse was palpated and she had CPR for 6 minutes.  She received epinephrine x1 and sodium bicarbonate x1.  There are no rhythm strips  available for review and no electrocardiogram either prior to the event or afterwards.  She had ROSC. She did require intubation.  Troponins minimally elevated but following CPR.  Treated for pneumonia.  She subsequently has had difficulties with hypotension.  Noted to have 5 minutes of atrial fibrillation on telemetry today and cardiology asked to evaluate. Electrocardiogram shows sinus rhythm with no significant ST changes.  Echocardiogram shows normal LV function, severe left ventricular hypertrophy, moderate RV dysfunction, moderate RV enlargement, mild left atrial enlargement and moderate right atrial enlargement.  Mild to moderate mitral regurgitation and moderate tricuspid regurgitation.  CTA shows no pulmonary embolus but there was bilateral lower lobe consolidation.  Peak troponin 0 0.95.  Telemetry showed brief episode of atrial fibrillation lasting 5 minutes.  Patient somewhat lethargic at time of evaluation.  She denies chest pain, dyspnea or palpitations.  No history of syncope.  1 paroxysmal atrial fibrillation-patient had a brief 5-minute episode of atrial fibrillation on telemetry.  She is now in sinus rhythm.  This occurs in the setting of recent stress of cardiac arrest, pneumonia and hypotension. CHADSvasc 4.  However I would not be inclined to commit the patient to long-term anticoagulation based on this one episode.  Continue telemetry.  Blood pressure will not allow AV nodal blocking agent.  If she has recurrence could consider amiodarone.  Note TSH is normal.  LV function normal on echocardiogram.  She does have right side enlargement.  Question history of obesity hypoventilation syndrome and obstructive sleep apnea.  No pulmonary embolus on CTA.  2 question recent cardiac arrest-details are vague in reviewing her chart.  She apparently was found unresponsive and then was felt to be pulseless.  There are no rhythm strips to document ventricular arrhythmias, PEA or asystole.  She had ROSC  6 minutes later.  Question if event was respiratory initially.  LV function is normal.  No follow-up rhythm disturbances on telemetry.  LV function is normal.  3 elevated troponin-minimal elevation but occurred following CPR.  LV function is normal.  She does have risk factors.  Would plan outpatient stress nuclear study for risk stratification following discharge and when she recovers.  4 hypotension-etiology unclear.  LV function is normal.  I agree with holding antihypertensives and treating with midodrine.  Question contribution from pneumonia.  5 end-stage renal disease-dialysis per nephrology.  6 pneumonia-continue antibiotics.  7 hyperlipidemia-continue statin.  Kirk Ruths, MD

## 2018-03-10 NOTE — Progress Notes (Signed)
Pt BP is 82/35, P 88. Messaged MD on call for further instructions. Awaiting response.   Eleanora Neighbor, RN

## 2018-03-10 NOTE — Progress Notes (Addendum)
FYI: Pt has been awake all night. Pt kept asking to "sit up". Stating the bed was uncomfortable and it was making her back hurt. Tylenol helped some, but not much per pt.   Eleanora Neighbor, RN

## 2018-03-10 NOTE — Progress Notes (Signed)
Subjective: Interval History: has complaints wants to get up soon, back ache.  Objective: Vital signs in last 24 hours: Temp:  [97.4 F (36.3 C)-98.8 F (37.1 C)] 98 F (36.7 C) (03/01 0515) Pulse Rate:  [21-98] 88 (03/01 0515) Resp:  [16-26] 18 (03/01 0515) BP: (82-148)/(30-118) 82/35 (03/01 0515) SpO2:  [93 %-100 %] 93 % (03/01 0515) Weight:  [99.1 kg-101.3 kg] 99.1 kg (02/29 2057) Weight change:   Intake/Output from previous day: 02/29 0701 - 03/01 0700 In: 420.8 [P.O.:240; IV Piggyback:180.8] Out: 2000  Intake/Output this shift: No intake/output data recorded.  General appearance: alert, cooperative, no distress and moderately obese Resp: diminished breath sounds bilaterally and rales bibasilar Cardio: S1, S2 normal and systolic murmur: systolic ejection 2/6, crescendo and decrescendo at 2nd left intercostal space GI: obese, pos bs soft, liver down 6 cm Extremities: avf LUA, dressing R foot.  Lab Results: Recent Labs    03/08/18 0805 03/09/18 1124  WBC 32.8* 34.4*  HGB 10.8* 11.3*  HCT 36.7 38.3  PLT 358 304   BMET:  Recent Labs    03/08/18 0805 03/09/18 1124  NA 132* 133*  K 4.4 4.3  CL 97* 100  CO2 24 16*  GLUCOSE 364* 195*  BUN 38* 56*  CREATININE 6.27* 8.00*  CALCIUM 9.1 9.3   No results for input(s): PTH in the last 72 hours. Iron Studies: No results for input(s): IRON, TIBC, TRANSFERRIN, FERRITIN in the last 72 hours.  Studies/Results: No results found.  I have reviewed the patient's current medications.  Assessment/Plan: 1 ESRD stable 2 Bp low , has been . Probable reflection of HD yest. But also meds. pneu 3 Anemia stable level pending from am 4 HPTH vit D 5 OSA needs noct BIPAP 6 CO2 retention 7 PVD 8 DM controlled 9 PNEU on AB P HD on Tues, follow bp, give mido, check labs. mobilize    LOS: 13 days   Jeneen Rinks [place] V[place] 03/10/2018,7:07 AM

## 2018-03-10 NOTE — NC FL2 (Signed)
[place] LEVEL OF CARE SCREENING TOOL     IDENTIFICATION  Patient Name: Rachel Chaney Birthdate: [birth year] Sex: female Admission Date (Current Location): 02/25/2018  [facility] and [place] Number:  Herbalist and Address:  [facility]. [facility], [place] [address] [place], [place] [address]      Provider Number: [id number]  Attending Physician Name and Address:  McDiarmid, Blane Ohara, MD  Relative Name and Phone Number:  Denae, [place], [phone]    Current Level of Care: Hospital Recommended Level of Care: [place] Prior Approval Number:    Date Approved/Denied: 03/10/18 PASRR Number: [id number]  Discharge Plan: SNF    Current Diagnoses: Patient Active Problem List   Diagnosis Date Noted  . Oxygen dependent   . Hypotension   . Respiratory failure ([place])   . Type 2 diabetes mellitus with diabetic neuropathy ([place])   . Abscess of toe of right foot 02/26/2018  . Diabetes mellitus type 2, uncontrolled ([place]) 02/26/2018  . Toe infection   . Diabetic polyneuropathy associated with type 2 diabetes mellitus ([place])   . Osteomyelitis of second toe of right foot ([place])   . Severe protein-calorie malnutrition ([place])   . Open toe fracture 02/25/2018  . Seasonal allergies 04/12/2017  . Post-menopausal bleeding 12/07/2015  . ESRD on dialysis ([place]) 05/25/2015  . Hypoalbuminemia   . Abnormal appearance of cervix 08/08/2011  . GERD (gastroesophageal reflux disease) 04/27/2010  . Hyperlipidemia 03/08/2006  . Morbid obesity ([place]) 03/08/2006  . Essential hypertension with morbid obesity 03/08/2006    Orientation RESPIRATION BLADDER Height & Weight     Self, Time, Situation, Place  O2, Other (Comment)(93,[place],2 and Bipap at night, medium face mask, resp rate 20, flow rate 8) Continent Weight: 218 lb 7.6 oz (99.1 kg) Height:  '5\' 4"'  (162.6 cm)  BEHAVIORAL SYMPTOMS/MOOD NEUROLOGICAL BOWEL NUTRITION STATUS      Incontinent  Diet(renal/carb modified diet, thin liquids and liquid restriction 1223m, no snacks)  AMBULATORY STATUS COMMUNICATION OF NEEDS Skin   Supervision Verbally (Has skin abrasion on right thigh, MASD on breasts being treated by barrier cream, toe amputation on right food, treated with gauze and dressings)                       Personal Care Assistance Level of Assistance  Bathing, Feeding, Dressing, Total care Bathing Assistance: Limited assistance Feeding assistance: Independent Dressing Assistance: Limited assistance Total Care Assistance: Limited assistance   Functional Limitations Info  Sight, Hearing, Speech Sight Info: Adequate Hearing Info: Adequate Speech Info: Adequate    SPECIAL CARE FACTORS FREQUENCY  PT (By licensed PT), OT (By licensed OT)     PT Frequency: 5x/wk OT Frequency: 5x/wk            Contractures Contractures Info: Not present    Additional Factors Info  Code Status, Insulin Sliding Scale Code Status Info: Full Code Allergies Info: No Known Allergies   Insulin Sliding Scale Info: insulin aspart 0-20 units 3x daily w/meals and insulin glargine (lantus) 25 units daily       Current Medications (03/10/2018):  This is the current hospital active medication list Current Facility-Administered Medications  Medication Dose Route Frequency Provider Last Rate Last Dose  . 0.9 %  sodium chloride infusion   Intravenous Continuous DNewt Minion MD   Stopped at 03/06/18 1911  . 0.9 %  sodium chloride infusion   Intra-arterial PRN EMargaretha Seeds MD      .  0.9 %  sodium chloride infusion  250 mL Intravenous Continuous Agarwala, Einar Grad, MD      . acetaminophen (TYLENOL) tablet 325-650 mg  325-650 mg Oral Q6H PRN [name], MD   325 mg at 03/10/18 0344  . alum & mag hydroxide-simeth (MAALOX/MYLANTA) 200-200-20 MG/5ML suspension 30 mL  30 mL Oral Q6H PRN [name], MD   30 mL at 02/26/18 2300  . atorvastatin (LIPITOR) tablet 80 mg  80 mg Per Tube Daily  Desai, Rahul P, PA-C   80 mg at 03/10/18 1031  . calcitRIOL (ROCALTROL) capsule 0.25 mcg  0.25 mcg Oral Daily [name], MD   0.25 mcg at 03/10/18 1031  . ceFEPIme (MAXIPIME) 1 g in sodium chloride 0.9 % 100 mL IVPB  1 g Intravenous Q24H Diallo, Abdoulaye, MD   Stopped at 03/08/18 1816  . chlorhexidine gluconate (MEDLINE KIT) (PERIDEX) 0.12 % solution 15 mL  15 mL Mouth Rinse BID Ollis, Brandi L, NP   15 mL at 03/10/18 0800  . Chlorhexidine Gluconate Cloth 2 % PADS 6 each  6 each Topical Q0600 Diallo, Abdoulaye, MD   6 each at 03/08/18 1729  . Chlorhexidine Gluconate Cloth 2 % PADS 6 each  6 each Topical Q0600 Deterding, James, MD      . Chlorhexidine Gluconate Cloth 2 % PADS 6 each  6 each Topical O3500 Mauricia Area, MD   6 each at 03/09/18 0539  . diclofenac sodium (VOLTAREN) 1 % transdermal gel 4 g  4 g Topical QID Diallo, Abdoulaye, MD   4 g at 03/09/18 2244  . feeding supplement (PRO-STAT SUGAR FREE 64) liquid 30 mL  30 mL Oral BID Loren Racer, PA-C   30 mL at 03/10/18 1031  . ferric citrate (AURYXIA) tablet 420 mg  420 mg Oral TID WC [name], MD   420 mg at 03/10/18 0759  . heparin injection 5,000 Units  5,000 Units Subcutaneous Q8H Hammons, Kimberly B, RPH   5,000 Units at 03/10/18 0531  . insulin aspart (novoLOG) injection 0-20 Units  0-20 Units Subcutaneous TID WC Diallo, Abdoulaye, MD   4 Units at 03/10/18 0800  . insulin glargine (LANTUS) injection 25 Units  25 Units Subcutaneous Daily Darrelyn Hillock N, DO   25 Units at 03/10/18 1031  . lactobacillus acidophilus (BACID) tablet 2 tablet  2 tablet Oral TID Bonnita Hollow, MD   2 tablet at 03/10/18 1031  . midodrine (PROAMATINE) tablet 10 mg  10 mg Oral TID WC Diallo, Abdoulaye, MD   10 mg at 03/10/18 0801  . multivitamin (RENA-VIT) tablet 1 tablet  1 tablet Oral QHS [name], MD   1 tablet at 03/09/18 2244  . ondansetron (ZOFRAN) tablet 4 mg  4 mg Oral Q6H PRN [name], MD       Or  . ondansetron  [facility]) injection 4 mg  4 mg Intravenous Q6H PRN [name], MD      . pantoprazole (PROTONIX) EC tablet 40 mg  40 mg Oral Daily McDiarmid, Blane Ohara, MD   40 mg at 03/10/18 1031     Discharge Medications: Please see discharge summary for a list of discharge medications.  Relevant Imaging Results:  Relevant Lab Results:   Additional Information SSN: [id number]  Philippa Chester Johngabriel Verde, LCSWA

## 2018-03-10 NOTE — Progress Notes (Signed)
FPTS Interim Progress Note  S:Paged by nursing, BP low at 82/35, 82/18 on recheck. Patient AAOx3, though with slow affect which may be her baseline. Patient denies chest pain, dyspnea. She complains of back pain which has been bothering her throughout the night.  O: BP (!) 82/35 (BP Location: Right Arm)   Pulse 88   Temp 98 F (36.7 C) (Oral)   Resp 18   Ht 5\' 4"  (1.626 m)   Wt 99.1 kg   LMP 09/18/2011   SpO2 93%   BMI 37.50 kg/m   GEN: female rests in bed, responds to commands and answers questions appropriately CARD: RRR PULM: no W/R/R ABD: soft NEURO: CN II-XII intact PSYCH: AAOx3  A/P: Hypotension, worsening. Patient continues to have baseline mentation. Limited in ability to fluid resuscitate due to ESRD. Differential is broad including sepsis and so we will recheck blood cultures. Considered aortic dissection but this is less likely given that hypotension is not acute and patient appears comfortable but we will check a CXR. No obvious bleed to suggest acute anemia but we will follow up AM CBC. CT angio on 2/27 was negative for PE. - gave 8AM midodrine early - 500cc bolus - paged CCM out of concern for decreasing BP and hx of code this admission. Spoke with CCM Dr. Jimmy Footman who advises monitoring mentation and follow up with renal recommendations. - get AM CBC and BMP - check bedside CXR given hypotension and back pain -  recheck BCx - EKG  Everrett Coombe, MD 03/10/2018, 5:36 AM PGY-3, [place] [facility] Service pager [phone]

## 2018-03-10 NOTE — Progress Notes (Signed)
Placed patient on CPAP, FFM  auto titrate settings max 18, min 6 with 2 lpm O2 bleed in.

## 2018-03-11 DIAGNOSIS — J95851 Ventilator associated pneumonia: Secondary | ICD-10-CM

## 2018-03-11 DIAGNOSIS — R918 Other nonspecific abnormal finding of lung field: Secondary | ICD-10-CM

## 2018-03-11 LAB — GLUCOSE, CAPILLARY
Glucose-Capillary: 161 mg/dL — ABNORMAL HIGH (ref 70–99)
Glucose-Capillary: 192 mg/dL — ABNORMAL HIGH (ref 70–99)
Glucose-Capillary: 96 mg/dL (ref 70–99)

## 2018-03-11 LAB — CULTURE, BLOOD (ROUTINE X 2)
Culture: NO GROWTH
Culture: NO GROWTH

## 2018-03-11 LAB — RENAL FUNCTION PANEL
Albumin: 2.2 g/dL — ABNORMAL LOW (ref 3.5–5.0)
Anion gap: 15 (ref 5–15)
BUN: 52 mg/dL — ABNORMAL HIGH (ref 6–20)
CO2: 20 mmol/L — ABNORMAL LOW (ref 22–32)
Calcium: 8.7 mg/dL — ABNORMAL LOW (ref 8.9–10.3)
Chloride: 100 mmol/L (ref 98–111)
Creatinine, Ser: 7.05 mg/dL — ABNORMAL HIGH (ref 0.44–1.00)
GFR calc Af Amer: 7 mL/min — ABNORMAL LOW (ref >60)
GFR calc non Af Amer: 6 mL/min — ABNORMAL LOW (ref >60)
Glucose, Bld: 197 mg/dL — ABNORMAL HIGH (ref 70–99)
Phosphorus: 4.2 mg/dL (ref 2.5–4.6)
Potassium: 3.5 mmol/L (ref 3.5–5.1)
Sodium: 135 mmol/L (ref 135–145)

## 2018-03-11 LAB — CBC
HCT: 34.8 % — ABNORMAL LOW (ref 36.0–46.0)
Hemoglobin: 10.6 g/dL — ABNORMAL LOW (ref 12.0–15.0)
MCH: 31.7 pg (ref 26.0–34.0)
MCHC: 30.5 g/dL (ref 30.0–36.0)
MCV: 104.2 fL — ABNORMAL HIGH (ref 80.0–100.0)
Platelets: 260 10*3/uL (ref 150–400)
RBC: 3.34 MIL/uL — ABNORMAL LOW (ref 3.87–5.11)
RDW: 16.1 % — ABNORMAL HIGH (ref 11.5–15.5)
WBC: 30.4 10*3/uL — ABNORMAL HIGH (ref 4.0–10.5)
nRBC: 6.7 % — ABNORMAL HIGH (ref 0.0–0.2)

## 2018-03-11 MED ORDER — HEPARIN SODIUM (PORCINE) 1000 UNIT/ML DIALYSIS
4500.0000 [IU] | INTRAMUSCULAR | Status: DC | PRN
  Administered 2018-03-11: 4500 [IU] via INTRAVENOUS_CENTRAL
  Filled 2018-03-11: qty 4.5

## 2018-03-11 MED ORDER — ENSURE ENLIVE PO LIQD
237.0000 mL | Freq: Two times a day (BID) | ORAL | Status: DC
  Administered 2018-03-11 – 2018-03-14 (×6): 237 mL via ORAL

## 2018-03-11 MED ORDER — HEPARIN SODIUM (PORCINE) 1000 UNIT/ML IJ SOLN
INTRAMUSCULAR | Status: AC
  Administered 2018-03-11: 4500 [IU] via INTRAVENOUS_CENTRAL
  Filled 2018-03-11: qty 5

## 2018-03-11 MED ORDER — CHLORHEXIDINE GLUCONATE CLOTH 2 % EX PADS: 6.0000 | MEDICATED_PAD | Freq: Every day | CUTANEOUS | Status: DC

## 2018-03-11 NOTE — Progress Notes (Signed)
Family Medicine Teaching Service Daily Progress Note Intern Pager: [phone]  Patient name: Rachel Chaney Medical record number: [id number] Date of birth: April 13, 1960 Age: 58 y.o. Gender: female  Primary Care Provider: Bufford Lope, DO Consultants: Nephrology, Orthopedics, CCM Code Status: Full code  Pt Overview and Major Events to Date:  2/17 > admit 2/19 > Toe amputation on right foot 2/20 > Cardiac arrest, intubation, transferred to ICU 2/20 > started on heparin gtt empirically for presumed PE (IV team noted thrombus in RUE cephalic vein) 9/92: Extubated 2/22: Nursing staff reporting more lethargic, had been transferred to medicine service, new hypercarbic respiratory failure.  Placed on BiPAP 2/23: Remains off blood pressure medications for several days now in spite of what it says on MAR.  Hemodynamically stable.  Much more awake.  Suspect episode on 2/23 primarily due to hypercarbia. 2/26: AMS with persistent hypotension. Started midodrine. 2/27: CTA demonstrates bilateral consolidation c/f PNA, no PE. D/C anticoagulation.   Antibiotic course: Vanc (2/17- 2/21) Ceftriaxone (2/17-2/21) Ancef (2/17; 2/19; 2/21-2/26) Metronidazole (2/21-2/24) Cefepime (2/26 - )   Assessment and Plan: Rachel I Dorsettis a 58 y.o.femalepresenting with painful 2nd toe of R foot. PMH is significant forESRD on HD MWF,T2DM,HTN, GERD, CHF, and dyslipidemia.  #Pneumonia-secondary to ventilator use Vitals stable overnight.  No additional episodes of hypotension.  Dr. Sharol Given was contacted regarding his preference for antibiotics and recommended doxycycline for further treatment of osteomyelitis when she is ready to transition to p.o. antibiotics. We will discuss abx further before her transition to oral antibiotics. - holding home hydral, carvedilol due to hypotension - Continue midodrine 10 mg tid - Continue Cefepime IV, renally dosed,until 3/5   #End-stage renal disease on HD, MWF No further  episodes of hypotension overnight. - Dialysis per nephro - Nephro following, appreciate recs - Renal/carb diet   #HFpEF:  Per cardiology note, normal LV function with increasing right ventricular dysfunction on recent echocardiograms.  Cardiology recommends a nuclear stress test study for risk stratification following discharge. -Follow-up with cardiology for outpatient nuclear stress study - Holding home carvedilol as above  #Transient A-fib In the setting of recent cardiac arrest, cardiology is hesitant to begin medication for this resolved episode of A. fib. -Continue to monitor  #Acute hypoxic and hypercarbic respiratory failure, improved Multifactorial with likely underlying OSA, OHS and pneumonia.  Supplemental oxygen stable at 2 L overnight. - Wean O2, titrate for sat 88-92% - Mandatory BiPap at night - Would benefit from mobilization, but pt should be nonweightbearing on the operative foot - pulmonary toilet - PT/OT recommending SNF - Sleep study as outpatient   #Multiple rib fractures - stable Her only complaint this morning was ongoing back pain. - Continue voltaren gel - Schedule Tylenol q6h  - Avoid PO NSAIDs  #S/p cardiac arrest 2/20 - unknown etiology S/P cardiac arrest 2/20 with unclear etiology. PE ruled out by CTA. Recent TTE showed LVEF 65-70%, severe LVH, with normal wall motion, grade1 DD, dilated IVC that does not collapse.  - Telemetry - Full code status  #Amputation 2nd and 3rd R toes 2/19 due to concern for osteomyelitis -Antibiotics as above - PT/OT -Encourage OOB  #T2DM:Stable HbA1c 11.4% 02/26/18.  25 units Lantus, 8 units aspart given on 3/1.  Blood glucose between 190 and to 220 on 3/1. - Continue Lantus to 25 u  - Continue resistant SSI - CBG monitoring 4 times daily  #Diarrhea, improved Likely secondary to prolonged course of antibiotics.  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 Was  receiving treatment as outpatient.  - Nephro following, appreciate recs - Continue home calcitriol and auryxia  #Chronic hypertension: Normotensive.  - holding home BP meds in setting of recent hypotension  #Hypoalbuminemia Albumin 2.6 on admission. No evidence of liver disease. Urinalysis from 2017 with > 300 protein indicating that there is likely a nephrotic syndrome component to her hypoalbuminemia. Protein calorie malnutrition given poor diet could also be contributing. . - Nephro following, appreciate recs - Nutrition consult  #Anemia of CKD:  BL 12. Today's CBC pending. Has not yet required ESA. Patient has macrocytic element. Folate wnl, B12 elevated due to supplamentation - Daily CBCs - smear review pending  #Hyperlipidemia: Most recent lipid panel April 2019 showing low cholesterol at 94, low HDL at 32, and other values within normal limits. - Continue atorvastatin 80 mg  #GERD: Patient asymptomatic at this time, usually takes Tums at home. -Protonix 40 daily -Mylanta prn  FEN/GI: Carb/renal diet PPx: heparin  Disposition: 2-3 more days of medical management anticipated before discharge  Subjective:  No acute events overnight.  She endorses no new symptoms or complaints this morning.  She continues to remark that she has back pain since receiving CPR.  She was told that this is likely due to damage to her ribs during CPR which will take time to heal.  Objective: Temp:  [97.7 F (36.5 C)-98.4 F (36.9 C)] 97.7 F (36.5 C) (03/01 1629) Pulse Rate:  [79-88] 88 (03/02 0603) Resp:  [18-19] 18 (03/02 0603) BP: (102-131)/(48-68) 131/68 (03/02 0603) SpO2:  [92 %-100 %] 92 % (03/02 0603)   Physical Exam: General: An obese 58 year old woman sitting comfortably in bed.  No acute distress. HEENT: Wide neck girth, difficult to assess for JVD. Cardio: Distant heart sounds with a radial pulse that showed a regular rate and rhythm. Pulm: Lung sounds difficult to assess  secondary to body habitus.  No rales/rhonchi appreciated on auscultation.  Breathing with normal respiratory effort on 2 L nasal cannula.   Abdomen: Bowel sounds normal. Abdomen soft and non-tender.  Extremities: No peripheral edema. Warm/ well perfused.  Strong radial pulse. Neuro: Cranial nerves grossly intact    Laboratory: Recent Labs  Lab 03/09/18 1124 03/10/18 0751 03/11/18 0504  WBC 34.4* 32.4* PENDING  HGB 11.3* 11.1* 10.6*  HCT 38.3 37.5 34.8*  PLT 304 274 260   Recent Labs  Lab 03/08/18 0805 03/09/18 1124 03/10/18 0751  NA 132* 133* 137  K 4.4 4.3 3.7  CL 97* 100 98  CO2 24 16* 18*  BUN 38* 56* 32*  CREATININE 6.27* 8.00* 5.30*  CALCIUM 9.1 9.3 9.1  GLUCOSE 364* 195* 170*    Imaging/Diagnostic Tests: No results found.   Matilde Haymaker, MD 03/11/2018, [place] Intern pager: [phone], text pages welcome

## 2018-03-11 NOTE — Progress Notes (Addendum)
Progress Note  Patient Name: Rachel Chaney Date of Encounter: 03/11/2018  Primary Cardiologist: Kirk Ruths, MD   Subjective   Pt denies SOB, palpitations, and rapid heart beat. She is sore in her chest from compressions, stating it hurts when she takes a deep breath.  Inpatient Medications    Scheduled Meds: . atorvastatin  80 mg Per Tube Daily  . calcitRIOL  0.25 mcg Oral Daily  . chlorhexidine gluconate (MEDLINE KIT)  15 mL Mouth Rinse BID  . Chlorhexidine Gluconate Cloth  6 each Topical Q0600  . diclofenac sodium  4 g Topical QID  . feeding supplement (ENSURE ENLIVE)  237 mL Oral BID BM  . feeding supplement (PRO-STAT SUGAR FREE 64)  30 mL Oral BID  . ferric citrate  420 mg Oral TID WC  . heparin injection (subcutaneous)  5,000 Units Subcutaneous Q8H  . insulin aspart  0-20 Units Subcutaneous TID WC  . insulin glargine  25 Units Subcutaneous Daily  . lactobacillus acidophilus  2 tablet Oral TID  . midodrine  10 mg Oral TID WC  . multivitamin  1 tablet Oral QHS  . pantoprazole  40 mg Oral Daily   Continuous Infusions: . sodium chloride Stopped (03/06/18 1911)  . sodium chloride    . sodium chloride    . ceFEPime (MAXIPIME) IV 1 g (03/10/18 1708)   PRN Meds: Place/Maintain arterial line **AND** sodium chloride, acetaminophen, alum & mag hydroxide-simeth, ondansetron **OR** ondansetron (ZOFRAN) IV   Vital Signs    Vitals:   03/10/18 1629 03/11/18 0603 03/11/18 0737 03/11/18 1046  BP: (!) 102/48 131/68 (!) 95/46 134/61  Pulse: 79 88 89 84  Resp:  18 20 (!) 22  Temp: 97.7 F (36.5 C)  97.8 F (36.6 C) 98.8 F (37.1 C)  TempSrc: Oral  Oral Oral  SpO2: 94% 92% 92% 94%  Weight:      Height:        Intake/Output Summary (Last 24 hours) at 03/11/2018 1206 Last data filed at 03/11/2018 5859 Gross per 24 hour  Intake 360 ml  Output 0 ml  Net 360 ml   Last 3 Weights 03/09/2018 03/09/2018 03/09/2018  Weight (lbs) 218 lb 7.6 oz 218 lb 7.6 oz 223 lb 5.2 oz    Weight (kg) 99.1 kg 99.1 kg 101.3 kg      Telemetry    Sinus, PVCs - Personally Reviewed  ECG    No new tracings - Personally Reviewed  Physical Exam   GEN: obese female, NAD Neck: No JVD, exam difficult Cardiac: RRR, no murmurs, rubs, or gallops.  Respiratory: respirations unlabored, diminished in bases GI: Soft, nontender, non-distended  MS: trace edema Neuro:  Nonfocal  Psych: Normal affect   Labs    Chemistry Recent Labs  Lab 03/09/18 1124 03/10/18 0751 03/11/18 0504  NA 133* 137 135  K 4.3 3.7 3.5  CL 100 98 100  CO2 16* 18* 20*  GLUCOSE 195* 170* 197*  BUN 56* 32* 52*  CREATININE 8.00* 5.30* 7.05*  CALCIUM 9.3 9.1 8.7*  ALBUMIN 2.3* 2.4* 2.2*  GFRNONAA 5* 8* 6*  GFRAA 6* 10* 7*  ANIONGAP 17* 21* 15     Hematology Recent Labs  Lab 03/09/18 1124 03/10/18 0751 03/11/18 0504  WBC 34.4* 32.4* 30.4*  RBC 3.57* 3.51* 3.34*  HGB 11.3* 11.1* 10.6*  HCT 38.3 37.5 34.8*  MCV 107.3* 106.8* 104.2*  MCH 31.7 31.6 31.7  MCHC 29.5* 29.6* 30.5  RDW 15.8* 16.1* 16.1*  PLT  304 274 260    Cardiac Enzymes Recent Labs  Lab 03/06/18 0758  TROPONINI 0.13*   No results for input(s): TROPIPOC in the last 168 hours.   BNP Recent Labs  Lab 03/08/18 0651  BNP 2,084.7*     DDimer No results for input(s): DDIMER in the last 168 hours.   Radiology    Dg Chest Port 1 View  Result Date: 03/10/2018 CLINICAL DATA:  Back pain and hypotension. EXAM: PORTABLE CHEST 1 VIEW COMPARISON:  03/07/2018 FINDINGS: There is mild cardiac enlargement. Decreased lung volumes. Bilateral lower lobe atelectasis and airspace consolidation is identified. Not significantly improved from previous exam. IMPRESSION: 1. Bilateral lower lobe atelectasis and airspace consolidation. Aeration to lungs not significantly improved from previous exam. Electronically Signed   By: Kerby Moors M.D.   On: 03/10/2018 13:55    Cardiac Studies   Echo 03/08/18: 1. The left ventricle has  hyperdynamic systolic function, with an ejection fraction of >65%. The cavity size was normal. There is severely increased left ventricular wall thickness. Left ventricular diastolic Doppler parameters are consistent with  impaired relaxation Indeterminent filling pressures The E/e' is 8-15.  2. The right ventricle has moderately reduced systolic function. The cavity was moderately enlarged. There is no increase in right ventricular wall thickness.  3. Left atrial size was mildly dilated.  4. Right atrial size was moderately dilated.  5. The mitral valve is degenerative. There is mild to moderate mitral annular calcification present.  6. Moderate tricuspid stenosis.  7. The tricuspid valve is normal in structure.  8. The aortic valve was not well visualized.  9. The aortic root and ascending aorta are normal in size and structure. 10. The inferior vena cava was dilated in size with <50% respiratory variability. 11. The interatrial septum was not well visualized. 12. When compared to the prior study: LVEF >65% on 03/01/2018.  Patient Profile     58 y.o. female with past medical history of end-stage renal disease dialysis dependent, diabetes mellitus, hypertension, hyperlipidemia, chronic diastolic congestive heart failure followed for one brief episode of atrial fibrillation and recent question cardiac arrest. She was admitted 02/25/18 for right foot pain after crush injury and required amputation of 2nd and 3rd digits on right foot. She coded in HD on March 30, 2018 - CPR for 6 min with ROSC.   Assessment & Plan    1. Atrial fibrillation - brief episode after cardiac arrest - one brief episode of Afib on telemetry after cardiac arrest, now in sinus - telemetry with sinus rhythm, some artifact and PVCs - holding off on committing her to long-term anticoagulation - may consider event monitor after she is discharged and has recovered   2. Recent cardiac arrest 3. Elevated troponin: 2/21: 0.95 -->  0.75 --> 0.63 --> / 0.13 on 2/26 - unclear if she had a ventricular arrhythmia, PEA, or asystole - unclear if this was a respiratory event - echo with normal LVEF - no arrhythmias on telemetry - she denies anginal symptoms, but has chest wall soreness from compressions - defer ischemic evaluation until the outpatient setting after she has recovered - holding hypertension medications - on midodrine      For questions or updates, please contact [place] Please consult [email or web address] for contact info under        Signed, Ledora Bottcher, PA  03/11/2018, 12:06 PM    History and all data above reviewed.  Patient examined.  I agree with the findings as above.  She denies  any pain or SOB at this time.  She did not feel her atrial fib yesterday.  She does not report palpitations or syncope at home.  The patient exam reveals COR:RRR  ,  Lungs: Clear  ,  Abd: Positive bowel sounds, no rebound no guarding, Ext No edema  .  All available labs, radiology testing, previous records reviewed. Agree with documented assessment and plan.   Atrial fib:  Single episode in a patient who would be high risk for anticoagulation.  We don't plan on starting this now but she will likely need some long term monitoring at home.  Arrest: Details are vague.  Again, long term monitoring will be indicated.  We don't think that other cardiac work up is necessarily indicated.   Rachel Chaney  12:57 PM  03/11/2018

## 2018-03-11 NOTE — Progress Notes (Signed)
Nutrition Follow-up  DOCUMENTATION CODES:   Obesity unspecified  INTERVENTION:   Resume Ensure Enlive BID (each provides 350 kcal, 20 g protein)  Continue ProStat BID (each provides 100 kcal, 15 g protein)  Continue Rena-Vit daily   NUTRITION DIAGNOSIS:   Increased nutrient needs related to wound healing, chronic illness as evidenced by estimated needs.  Ongoing  GOAL:   Patient will meet greater than or equal to 90% of their needs  Not meeting  MONITOR:   PO intake, Supplement acceptance, Labs, Skin  REASON FOR ASSESSMENT:   Consult Assessment of nutrition requirement/status  ASSESSMENT:   58 yo female with PMH of DM, HLD, HTN, CHF, ESRD on HD who was admitted with osteomyelitis and abscess of R foot second and third toes.   Labs: glucose 197 mg/dL, BUN and Creatinine elevated Meds: Lipitor, ProStat BID, ferric citrate, novolog, lantus, Rena-vit, Protonix EC  Pt asleep, but woke easily to the sound of her name. States that her appetite is improving and that she has been eating. Denies nausea or trouble with bowels. Pt amenable to resuming Ensure Enlive BID and continuing with ProStat BID.   Per nsg, pt eats better when husband is present. Had a few bites of scrambled eggs, some applesauce at breakfast today.   Diet Order:  No known food allergies Diet Order            Diet renal/carb modified with fluid restriction Diet-HS Snack? Nothing; Fluid restriction: 1200 mL Fluid; Room service appropriate? Yes; Fluid consistency: Thin  Diet effective now            PO Intake: No meals documented  EDUCATION NEEDS:   Education needs have been addressed  Skin:  Skin Assessment: Skin Integrity Issues: Skin Integrity Issues:: Diabetic Ulcer Diabetic Ulcer: R foot abscess 2nd and 3rd toe   Last BM:  3/2, type 6  Height:  Ht Readings from Last 1 Encounters:  02/25/18 5\' 4"  (1.626 m)    Weight:  Wt Readings from Last 10 Encounters:  03/09/18 99.1 kg   08/23/17 108.9 kg  05/21/17 109 kg  04/12/17 106.4 kg  9.8 kg wt loss x 6 months = 9% may be indicative of malnutrition  Ideal Body Weight:  54.5 kg  BMI:  Body mass index is 37.5 kg/m. obese class 2  Estimated Nutritional Needs: 3/2 remains valid  Kcal:  2000-2200  Protein:  100-115 gm  Fluid:  UOP +1 L  Althea Grimmer, MS, RDN, LDN Pager: [phone] Available Mondays and Fridays, 9am-2pm

## 2018-03-11 NOTE — Progress Notes (Signed)
Pt just called wanting CPAP off. States it is too uncomfortable. "Just put the little one on." [place] at 2LPM put on at this time.   Eleanora Neighbor, RN

## 2018-03-11 NOTE — Progress Notes (Addendum)
[facility] Progress Note  Subjective: no new c/o's, pt is drowsy and has some jerking of extremities  Vitals:   03/10/18 1050 03/10/18 1629 03/11/18 0603 03/11/18 0737  BP: (!) 104/52 (!) 102/48 131/68 (!) 95/46  Pulse: 88 79 88 89  Resp: _0 Temp: 98.4 F (36.9 C) 97.7 F (36.5 C)  97.8 F (36.6 C)  TempSrc: Oral Oral  Oral  SpO2: 100% 94% 92% 92%  Weight:      Height:        Inpatient medications: . atorvastatin  80 mg Per Tube Daily  . calcitRIOL  0.25 mcg Oral Daily  . chlorhexidine gluconate (MEDLINE KIT)  15 mL Mouth Rinse BID  . Chlorhexidine Gluconate Cloth  6 each Topical Q0600  . Chlorhexidine Gluconate Cloth  6 each Topical Q0600  . Chlorhexidine Gluconate Cloth  6 each Topical Q0600  . diclofenac sodium  4 g Topical QID  . feeding supplement (PRO-STAT SUGAR FREE 64)  30 mL Oral BID  . ferric citrate  420 mg Oral TID WC  . heparin injection (subcutaneous)  5,000 Units Subcutaneous Q8H  . insulin aspart  0-20 Units Subcutaneous TID WC  . insulin glargine  25 Units Subcutaneous Daily  . lactobacillus acidophilus  2 tablet Oral TID  . midodrine  10 mg Oral TID WC  . multivitamin  1 tablet Oral QHS  . pantoprazole  40 mg Oral Daily   . sodium chloride Stopped (03/06/18 1911)  . sodium chloride    . sodium chloride    . ceFEPime (MAXIPIME) IV 1 g (03/10/18 1708)   Place/Maintain arterial line **AND** sodium chloride, acetaminophen, alum & mag hydroxide-simeth, ondansetron **OR** ondansetron (ZOFRAN) IV  Iron/TIBC/Ferritin/ %Sat    Component Value Date/Time   IRON 32 04/19/2015 1525   IRON 55 09/11/2013 1138   TIBC 309 04/19/2015 1525   TIBC 271 09/11/2013 1138   FERRITIN 172 04/19/2015 1525   FERRITIN 106 09/11/2013 1138   IRONPCTSAT 10 (L) 04/19/2015 1525   IRONPCTSAT 20 (L) 09/11/2013 1138    Exam: General:WDWN NAD Head:NCAT,sclera not icteric, EOM intact Neck: Supple. No lymphadenopathy Lungs: coarse rales L base, dec'd  at R base Heart:RRR. No murmur, rubs or gallops.  Abdomen: soft, nontender, +BS, no guarding, no rebound tenderness Ext: no LE edema, R foot bandaged Neuro: AAOx3. Moves all extremities, +some myoclonic jerking at rest Dialysis Access:LUE AVF, + thrill and bruit   MWF [facility]  4h  400/800  109kg  2/2 bath  LUE AVF  Hep 8000 + 2000 prn Mircera: None Calcitriol: 1.78mg PO qHD  Last Labs:01/28/2018:Hgb 12.1, TSAT30.0, K4.7, Ca8.2, P6.3, PTH1028.0, Alb3.6.   CXR 3/1 bilat lower lobe consolidation/ atx, no edema  Assessment: 1. ESRD stable 2. Pneumonia on AB 3. DM cont 4. BP low, under dry wt significantly 5. HTPH vit D 6. CO2 retention/OSA/ OHS - on noct bipap 7. Obesity 8. PVD sp R foot toe amp x 2 on 2/19   P: 1. HD today to get back on schedule, keep even      [facility] 03/11/2018, 9:08 AM  Recent Labs  Lab 03/05/18 0500 03/06/18 0339  03/10/18 0751 03/11/18 0504  NA 135 133*   < > 137 135  K 3.7 3.9   < > 3.7 3.5  CL 98 96*   < > 98 100  CO2 22 24   < > 18* 20*  GLUCOSE 251* 260*   < >  170* 197*  BUN 27* 41*   < > 32* 52*  CREATININE 3.99* 5.78*   < > 5.30* 7.05*  CALCIUM 8.7* 8.8*   < > 9.1 8.7*  PHOS 4.3 4.3   < > 4.7* 4.2  ALBUMIN 2.2* 2.2*   < > 2.4* 2.2*  INR 1.5* 1.2  --   --   --    < > = values in this interval not displayed.   No results for input(s): AST, ALT, ALKPHOS, BILITOT, PROT in the last 168 hours. Recent Labs  Lab 03/10/18 0751 03/11/18 0504  WBC 32.4* 30.4*  HGB 11.1* 10.6*  HCT 37.5 34.8*  MCV 106.8* 104.2*  PLT 274 260      

## 2018-03-12 DIAGNOSIS — J9692 Respiratory failure, unspecified with hypercapnia: Secondary | ICD-10-CM

## 2018-03-12 DIAGNOSIS — R0603 Acute respiratory distress: Secondary | ICD-10-CM

## 2018-03-12 DIAGNOSIS — J9691 Respiratory failure, unspecified with hypoxia: Secondary | ICD-10-CM

## 2018-03-12 LAB — CBC
HCT: 36.4 % (ref 36.0–46.0)
Hemoglobin: 10.8 g/dL — ABNORMAL LOW (ref 12.0–15.0)
MCH: 31.1 pg (ref 26.0–34.0)
MCHC: 29.7 g/dL — ABNORMAL LOW (ref 30.0–36.0)
MCV: 104.9 fL — ABNORMAL HIGH (ref 80.0–100.0)
Platelets: 231 10*3/uL (ref 150–400)
RBC: 3.47 MIL/uL — ABNORMAL LOW (ref 3.87–5.11)
RDW: 16.9 % — ABNORMAL HIGH (ref 11.5–15.5)
WBC: 28.8 10*3/uL — ABNORMAL HIGH (ref 4.0–10.5)
nRBC: 6 % — ABNORMAL HIGH (ref 0.0–0.2)

## 2018-03-12 LAB — BASIC METABOLIC PANEL
Anion gap: 14 (ref 5–15)
BUN: 21 mg/dL — ABNORMAL HIGH (ref 6–20)
CO2: 23 mmol/L (ref 22–32)
Calcium: 8.5 mg/dL — ABNORMAL LOW (ref 8.9–10.3)
Chloride: 101 mmol/L (ref 98–111)
Creatinine, Ser: 4.29 mg/dL — ABNORMAL HIGH (ref 0.44–1.00)
GFR calc Af Amer: 12 mL/min — ABNORMAL LOW (ref >60)
GFR calc non Af Amer: 11 mL/min — ABNORMAL LOW (ref >60)
Glucose, Bld: 135 mg/dL — ABNORMAL HIGH (ref 70–99)
Potassium: 3.1 mmol/L — ABNORMAL LOW (ref 3.5–5.1)
Sodium: 138 mmol/L (ref 135–145)

## 2018-03-12 LAB — GLUCOSE, CAPILLARY
Glucose-Capillary: 118 mg/dL — ABNORMAL HIGH (ref 70–99)
Glucose-Capillary: 129 mg/dL — ABNORMAL HIGH (ref 70–99)
Glucose-Capillary: 131 mg/dL — ABNORMAL HIGH (ref 70–99)
Glucose-Capillary: 165 mg/dL — ABNORMAL HIGH (ref 70–99)
Glucose-Capillary: 260 mg/dL — ABNORMAL HIGH (ref 70–99)

## 2018-03-12 LAB — PATHOLOGIST SMEAR REVIEW

## 2018-03-12 MED ORDER — CHLORHEXIDINE GLUCONATE CLOTH 2 % EX PADS
6.0000 | MEDICATED_PAD | Freq: Every day | CUTANEOUS | Status: DC
  Administered 2018-03-12 – 2018-03-13 (×2): 6 via TOPICAL

## 2018-03-12 MED ORDER — CALCITRIOL 0.5 MCG PO CAPS
1.2500 ug | ORAL_CAPSULE | ORAL | Status: DC
  Administered 2018-03-13 (×2): 1.25 ug via ORAL
  Filled 2018-03-12: qty 1

## 2018-03-12 NOTE — Clinical Social Work Placement (Signed)
   CLINICAL SOCIAL WORK PLACEMENT  NOTE *BED OFFERS GIVEN TO PATIENT ON 03/12/18  Date:  03/12/2018  Patient Details  Name: Rachel Chaney MRN: [id number] Date of Birth: 15-Jan-1960  Clinical Social Work is seeking post-discharge placement for this patient at the [place] level of care (*CSW will initial, date and re-position this form in  chart as items are completed):  Yes   Patient/family provided with [facility] Work Department's list of facilities offering this level of care within the geographic area requested by the patient (or if unable, by the patient's family).  Yes   Patient/family informed of their freedom to choose among providers that offer the needed level of care, that participate in Medicare, Medicaid or managed care program needed by the patient, have an available bed and are willing to accept the patient.  Yes   Patient/family informed of [facility]'s ownership interest in [facility] and [facility], as well as of the fact that they are under no obligation to receive care at these facilities.  PASRR submitted to EDS on 03/08/18     PASRR number received on 03/08/18     Existing PASRR number confirmed on       FL2 transmitted to all facilities in geographic area requested by pt/family on 03/08/18     FL2 transmitted to all facilities within larger geographic area on       Patient informed that his/her managed care company has contracts with or will negotiate with certain facilities, including the following:        Yes(Bed offered given to patient and daughter [name] on 03/12/18)   Patient/family informed of bed offers received.  Patient chooses bed at       Physician recommends and patient chooses bed at      Patient to be transferred to   on  .  Patient to be transferred to facility by       Patient family notified on   of transfer.  Name of family member notified:        PHYSICIAN       Additional Comment:     _______________________________________________ Sable Feil, LCSW 03/12/2018, 5:22 PM

## 2018-03-12 NOTE — Progress Notes (Addendum)
Physical Therapy Treatment Patient Details Name: Rachel Chaney MRN: [id number] DOB: 1960/08/12 Today's Date: 03/12/2018    History of Present Illness 58 year old female who admitted for crush injury of foot. 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 Wound culture grew S. Aureus with sensitivities pending. During dialysis, patient was found unresponsive. CPR was performed, she was intubated 2/20 and transferred to ICU. Presumed PE (IV team noted thrombus in RUE cephalic vein) Extubated 01/30/95 PMH: ESRD on HD, HTN, DM2, GERD    PT Comments    Pt and daughter initially refusing SNF and state the pt will be well cared for at home.  Focus of session was to assess basic transfers to see if pt could function at a transfers-only level and manage with a wheelchair at d/c. Pt unable to achieve stand and unable to attempt squat pivot while maintaining NWB status. Multiple unsuccessful attempts with RW and daughter assisting. At end of session, had discussion with pt and daughter regarding current level of function, and pt/daughter agree that return home at this time is not safe and they are now agreeable to SNF level rehab prior to return home with family assist. RN, MD, CSW, CM all updated. Will continue to follow.    Follow Up Recommendations  SNF;Supervision/Assistance - 24 hour     Equipment Recommendations  Wheelchair (measurements PT);Hospital bed    Recommendations for Other Services OT consult     Precautions / Restrictions Precautions Precautions: Fall Restrictions Weight Bearing Restrictions: Yes RLE Weight Bearing: Non weight bearing    Mobility  Bed Mobility Overal bed mobility: Needs Assistance Bed Mobility: Supine to Sit;Sit to Supine     Supine to sit: Mod assist;HOB elevated Sit to supine: Mod assist;+2 for physical assistance   General bed mobility comments: Pt initiated but required bed rail and HHA from therapist to pull to full sitting  position. Pt required increased time to scoot fully around to EOB and rest feet on floor.   Transfers Overall transfer level: Needs assistance Equipment used: Rolling walker (2 wheeled) Transfers: Sit to/from Stand          Lateral/Scoot Transfers: Max assist;+2 physical assistance;+2 safety/equipment General transfer comment: Multiple unsuccessful attempts to stand at EOB. Bed height elevated, daughter assisting (to keep R foot NWB), and max assist from therapist. Pt was barely able to weight shift forward enough to begin to lift hips off bed, but never fully cleared bed.   Ambulation/Gait             General Gait Details: unable   Stairs             Wheelchair Mobility    Modified Rankin (Stroke Patients Only)       Balance Overall balance assessment: Needs assistance Sitting-balance support: Bilateral upper extremity supported;Feet supported Sitting balance-Leahy Scale: Fair                                      Cognition Arousal/Alertness: Awake/alert Behavior During Therapy: WFL for tasks assessed/performed Overall Cognitive Status: Impaired/Different from baseline Area of Impairment: Orientation;Attention;Memory;Following commands;Awareness;Safety/judgement;Problem solving                 Orientation Level: (oriented ) Current Attention Level: Selective Memory: Decreased short-term memory;Decreased recall of precautions Following Commands: Follows one step commands inconsistently;Follows one step commands with increased time Safety/Judgement: Decreased awareness of deficits;Decreased  awareness of safety Awareness: Emergent Problem Solving: Slow processing;Difficulty sequencing;Requires verbal cues;Requires tactile cues General Comments: Unable to follow NWB status and required repetition of cues/commands for pt to follow.       Exercises      General Comments        Pertinent Vitals/Pain Pain Assessment: Faces Faces Pain  Scale: Hurts little more Pain Location: chest from CPR Pain Descriptors / Indicators: Sore    Home Living Family/patient expects to be discharged to:: Private residence Living Arrangements: Spouse/significant other Available Help at Discharge: Family Type of Home: House Home Access: Stairs to enter Entrance Stairs-Rails: Right Home Layout: One level Home Equipment: Environmental consultant - 2 wheels      Prior Function Level of Independence: Independent          PT Goals (current goals can now be found in the care plan section) Acute Rehab PT Goals Patient Stated Goal: none stated PT Goal Formulation: With patient/family Time For Goal Achievement: 03/15/18 Potential to Achieve Goals: Fair Progress towards PT goals: Progressing toward goals    Frequency    Min 2X/week      PT Plan Current plan remains appropriate    Co-evaluation              AM-PAC PT "6 Clicks" Mobility   Outcome Measure  Help needed turning from your back to your side while in a flat bed without using bedrails?: A Little Help needed moving from lying on your back to sitting on the side of a flat bed without using bedrails?: A Lot Help needed moving to and from a bed to a chair (including a wheelchair)?: Total Help needed standing up from a chair using your arms (e.g., wheelchair or bedside chair)?: Total Help needed to walk in hospital room?: Total Help needed climbing 3-5 steps with a railing? : Total 6 Click Score: 9    End of Session Equipment Utilized During Treatment: Gait belt;Oxygen Activity Tolerance: Patient tolerated treatment well Patient left: in bed;with call bell/phone within reach;with bed alarm set;with family/visitor present Nurse Communication: Mobility status;Need for lift equipment PT Visit Diagnosis: Muscle weakness (generalized) (M62.81);Other symptoms and signs involving the nervous system (R29.898);Other abnormalities of gait and mobility (R26.89);Difficulty in walking, not  elsewhere classified (R26.2)     Time: 1610-9604 PT Time Calculation (min) (ACUTE ONLY): 36 min  Charges:  $Gait Training: 23-37 mins                     Rachel Chaney, PT, DPT Acute Rehabilitation Services Pager: [phone] Office: [phone]    Thelma Comp 03/12/2018, 2:46 PM

## 2018-03-12 NOTE — Progress Notes (Signed)
Patient refused CPAP for HS.  Patient encouraged to call for Respiratory if CPAP desired.

## 2018-03-12 NOTE — Progress Notes (Addendum)
Family Medicine Teaching Service Daily Progress Note Intern Pager: [phone]  Patient name: Rachel Chaney Medical record number: [id number] Date of birth: Nov 07, 1960 Age: 58 y.o. Gender: female  Primary Care Provider: Bufford Lope, DO Consultants: Nephrology, Orthopedics, CCM,cardiology Code Status: Full code  Pt Overview and Major Events to Date:  2/17 > admit 2/19 > Toe amputation on right foot 2/20 > Cardiac arrest, intubation, transferred to ICU 2/20 > started on heparin gtt empirically for presumed PE (IV team noted thrombus in RUE cephalic vein) 1/47: Extubated 2/22: Nursing staff reporting more lethargic, had been transferred to medicine service, new hypercarbic respiratory failure.  Placed on BiPAP 2/23: Remains off blood pressure medications for several days now in spite of what it says on MAR.  Hemodynamically stable.  Much more awake.  Suspect episode on 2/23 primarily due to hypercarbia. 2/26: AMS with persistent hypotension. Started midodrine. 2/27: CTA demonstrates bilateral consolidation c/f PNA, no PE. D/C anticoagulation.   Antibiotic course: Vanc (2/17- 2/21) Ceftriaxone (2/17-2/21) Ancef (2/17; 2/19; 2/21-2/26) Metronidazole (2/21-2/24) Cefepime (2/26 - )   Assessment and Plan: Rachel I Dorsettis a 58 y.o.femalepresenting with painful 2nd toe of R foot. PMH is significant forESRD on HD MWF,T2DM,HTN, GERD, CHF, and dyslipidemia. Now found to have ventilator acquired pneumonia.  #Pneumonia-secondary to ventilator use-improving No additional episodes of hypotension overnight.  Heart rate many remained stable within normal limits overnight.  Patient was briefly put on room air overnight but is now back to nasal cannula 2 L.  She declined CPAP overnight.  Overall, she appears to be improving and her last dose of antibiotics is today. - Wean oxygen as tolerated - holding home hydral, carvedilol due to hypotension - Continue midodrine 10 mg tid - Complete  cefepime today, no additional antibiotics   #End-stage renal disease on HD, MWF-stable No further episodes of hypotension overnight. - Dialysis per nephro - Nephro following, appreciate recs - Renal/carb diet   #HFpEF: -Stable - Follow-up with cardiology for outpatient nuclear stress study and Holter monitor - Holding home carvedilol as above  #Transient A-fib -no further episodes-resolved -Continue to monitor  #Acute hypoxic and hypercarbic respiratory failure, improving Multifactorial with likely underlying OSA, OHS and pneumonia.  Supplemental oxygen stable at 2 L overnight. - Wean O2, titrate for sat 88-92% - Mandatory BiPap at night - Would benefit from mobilization, but pt should be nonweightbearing on the operative foot - pulmonary toilet - PT/OT recommending SNF - Sleep study as outpatient   #Multiple rib fractures -stable Her only complaint this morning was ongoing back pain. 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 Sharol Given contacted today, no further antibiotics necessary at this time. -No further antibiotics following dose of cefepime today (3/3) -Encourage OOB  #T2DM:Stable HbA1c 11.4% (2/18).  Glucose between 96 and 197 in past 24 hours. - Continue Lantus to 25 u  - Continue resistant SSI - CBG monitoring 4 times daily  #Diarrhea, improved Likely secondary to prolonged course of antibiotics.  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 Was receiving treatment as outpatient.  - Nephro following, appreciate recs - Continue home calcitriol and auryxia  #Chronic hypertension: Normotensive.  - holding home BP meds in setting of recent hypotension  #Hypoalbuminemia Albumin 2.6 on admission. No evidence of liver disease. Urinalysis from 2017 with >  300 protein  indicating that there is likely a nephrotic syndrome component to her hypoalbuminemia. Protein calorie malnutrition given poor diet could also be contributing.  - Nephro following, appreciate recs  #Anemia of CKD:   Hemoglobin 10.8 on 3/3. - smear review pending  #Hyperlipidemia:  - Continue atorvastatin 80 mg  #GERD: Stable -Protonix 40 daily -Mylanta prn  FEN/GI: Carb/renal diet PPx: heparin  Disposition: Possible discharge today pending evaluation by PT.  Subjective:  No new complaints this morning.  She reported that she finds the CPAP uncomfortable to use at night and decided not use it last night.  He continues to note chest/back pain secondary to her CPR.  She was put on room air but quickly demonstrated increased tachypnea and not breathing so was put back on 2 L nasal cannula.  Objective: Temp:  [97.8 F (36.6 C)-99 F (37.2 C)] 98.4 F (36.9 C) (03/03 0459) Pulse Rate:  [79-90] 87 (03/03 0459) Resp:  [20-22] 20 (03/03 0459) BP: (95-136)/(45-89) 119/45 (03/03 0459) SpO2:  [91 %-96 %] 92 % (03/03 0459) Weight:  [98.6 kg-99.4 kg] 98.6 kg (03/02 1632)   Physical Exam: General: Obese 58 year old woman with slow mentation.  Alert and oriented x3. HEENT: Large neck girth, no appreciated JVD. Cardio: Distant heart sounds.  Radial pulse demonstrated regular rate and rhythm. Pulm: Breathing comfortably on 2 L nasal cannula.  Rhonchi noted in bilateral lower lobes on auscultation.  No wheezing. Abdomen: Bowel sounds normal. Abdomen soft and non-tender.  Extremities: No peripheral edema. Warm/ well perfused.  Strong radial pulse. Neuro: Cranial nerves grossly intact   Laboratory: Recent Labs  Lab 03/10/18 0751 03/11/18 0504 03/12/18 0547  WBC 32.4* 30.4* PENDING  HGB 11.1* 10.6* 10.8*  HCT 37.5 34.8* 36.4  PLT 274 260 231   Recent Labs  Lab 03/09/18 1124 03/10/18 0751 03/11/18 0504  NA 133* 137 135  K 4.3 3.7 3.5  CL 100 98 100  CO2 16* 18* 20*  BUN 56*  32* 52*  CREATININE 8.00* 5.30* 7.05*  CALCIUM 9.3 9.1 8.7*  GLUCOSE 195* 170* 197*    Imaging/Diagnostic Tests: No results found.   Matilde Haymaker, MD 03/12/2018, 6:24 AM [facility] Intern pager: [phone], text pages welcome

## 2018-03-12 NOTE — Progress Notes (Addendum)
   Tele shows NSR, no arrhythmias. Will follow from a distance. Please let team know when patient is being discharged so we can arrange event monitor - Dr. Percival Spanish suggests 2 week duration would be fine. Dayna Dunn PA-C

## 2018-03-12 NOTE — Progress Notes (Addendum)
[facility] Progress Note   Dialysis Orders: [facility]  4h MWF 400/800  109kg  2/2 bath  LUE AVF  Hep 8000 + 2000 prn Mircera: None Calcitriol: 1.86mg PO qHD Last Labs:01/28/2018:Hgb 12.1, TSAT30.0, K4.7, Ca8.2, P6.3, PTH1028.0, Alb3.6.   CXR 3/1 bilat lower lobe consolidation/ atx, no edema  Assessment/Plan: 1. PNA 2/27 - abtx per primary 2. ESRD - MWF next HD Wed K 3.1 needs added K bath - probably need to change to 3 K bath at d/c 3. Anemia - hgb 10.8 stable - not on ESA 4. Secondary hyperparathyroidism - P ok on Aurxyia, calcitriol here at 0.25 lower than outpatient dose of calcitriol dose of 1.75 - corr Ca 9.4 - will increase calcitriol to 1.25 for now 5. HTN/volume - lower edw for dc - kept even on HD Monday > post wt 98.6  6. Nutrition - alb 2.2 with wt loss 7. PVD - s/p right foot toe amp x 2 /19 9/  CO2 retention/OSA/OHS - on nocturnal CPAP - refused last night- not using at home 10. Afib brief episode after cardiac arrest 2/20 - cards following - unclear if arrest was ventricular arrhythmia, PEA or asystole, vs respiratory event - Echo with normal EF- holding BP meds - on midodrine  MMyriam Jacobson PA-C CMount Carbon3/03/2018,8:58 AM  LOS: 15 days   Pt seen, examined and agree w A/P as above.  [facility] 03/12/2018, 11:16 AM    Subjective:   Doesn't like CPAP -refused last night - prefers O2 - explained why CPAP better but don't think I convinced her.  Coughing some. No problems with HD yesterday   Objective Vitals:   03/11/18 1632 03/11/18 1721 03/11/18 1951 03/12/18 0459  BP: 131/68 (!) 125/54 (!) 136/59 (!) 119/45  Pulse: 79 87 84 87  Resp: '20 20 20 20  ' Temp: 98 F (36.7 C) 98.8 F (37.1 C) 99 F (37.2 C) 98.4 F (36.9 C)  TempSrc: Oral Oral Oral Oral  SpO2: 96% 94% 91% 92%  Weight: 98.6 kg     Height:       Physical Exam General: alert sitting up in bed with O2 breathing  easily Heart: RRR Lungs: rhonchi right mid/lower lung Abdomen: obese soft NT +BS Extremities: no sig LE edema - right foot wrapped with ACE Dialysis Access: left upper AVF + bruit   Additional Objective Labs: Basic Metabolic Panel: Recent Labs  Lab 03/09/18 1124 03/10/18 0751 03/11/18 0504 03/12/18 0547  NA 133* 137 135 138  K 4.3 3.7 3.5 3.1*  CL 100 98 100 101  CO2 16* 18* 20* 23  GLUCOSE 195* 170* 197* 135*  BUN 56* 32* 52* 21*  CREATININE 8.00* 5.30* 7.05* 4.29*  CALCIUM 9.3 9.1 8.7* 8.5*  PHOS 4.2 4.7* 4.2  --    Liver Function Tests: Recent Labs  Lab 03/09/18 1124 03/10/18 0751 03/11/18 0504  ALBUMIN 2.3* 2.4* 2.2*   No results for input(s): LIPASE, AMYLASE in the last 168 hours. CBC: Recent Labs  Lab 03/08/18 0805 03/09/18 1124 03/10/18 0751 03/11/18 0504 03/12/18 0547  WBC 32.8* 34.4* 32.4* 30.4* 28.8*  HGB 10.8* 11.3* 11.1* 10.6* 10.8*  HCT 36.7 38.3 37.5 34.8* 36.4  MCV 106.7* 107.3* 106.8* 104.2* 104.9*  PLT 358 304 274 260 231   Blood Culture    Component Value Date/Time   SDES BLOOD RIGHT HAND 03/10/2018 0843   SPECREQUEST  03/10/2018 0843    AEROBIC BOTTLE ONLY  Blood Culture results may not be optimal due to an inadequate volume of blood received in culture bottles   CULT  03/10/2018 0843    NO GROWTH 1 DAY Performed at [facility] [address] [place], [place] [address]    REPTSTATUS PENDING 03/10/2018 0843    Cardiac Enzymes: Recent Labs  Lab 03/06/18 0758  TROPONINI 0.13*   CBG: Recent Labs  Lab 03/11/18 0739 03/11/18 1150 03/11/18 1723 03/12/18 0041 03/12/18 0723  GLUCAP 161* 192* 96 118* 129*   Iron Studies: No results for input(s): IRON, TIBC, TRANSFERRIN, FERRITIN in the last 72 hours. Lab Results  Component Value Date   INR 1.2 03/06/2018   INR 1.5 (H) 03/05/2018   INR 1.27 03/04/2018   Studies/Results: Dg Chest Port 1 View  Result Date: 03/10/2018 CLINICAL DATA:  Back pain and hypotension. EXAM:  PORTABLE CHEST 1 VIEW COMPARISON:  03/07/2018 FINDINGS: There is mild cardiac enlargement. Decreased lung volumes. Bilateral lower lobe atelectasis and airspace consolidation is identified. Not significantly improved from previous exam. IMPRESSION: 1. Bilateral lower lobe atelectasis and airspace consolidation. Aeration to lungs not significantly improved from previous exam. Electronically Signed   By: Kerby Moors M.D.   On: 03/10/2018 13:55   Medications: . sodium chloride Stopped (03/06/18 1911)  . sodium chloride    . sodium chloride    . ceFEPime (MAXIPIME) IV 1 g (03/11/18 1742)   . atorvastatin  80 mg Per Tube Daily  . calcitRIOL  0.25 mcg Oral Daily  . chlorhexidine gluconate (MEDLINE KIT)  15 mL Mouth Rinse BID  . Chlorhexidine Gluconate Cloth  6 each Topical Q0600  . diclofenac sodium  4 g Topical QID  . feeding supplement (ENSURE ENLIVE)  237 mL Oral BID BM  . feeding supplement (PRO-STAT SUGAR FREE 64)  30 mL Oral BID  . ferric citrate  420 mg Oral TID WC  . heparin injection (subcutaneous)  5,000 Units Subcutaneous Q8H  . insulin aspart  0-20 Units Subcutaneous TID WC  . insulin glargine  25 Units Subcutaneous Daily  . lactobacillus acidophilus  2 tablet Oral TID  . midodrine  10 mg Oral TID WC  . multivitamin  1 tablet Oral QHS  . pantoprazole  40 mg Oral Daily

## 2018-03-12 NOTE — Progress Notes (Signed)
SATURATION QUALIFICATIONS: (This note is used to comply with regulatory documentation for home oxygen)  Patient Saturations on Room Air at Rest = 68%  Patient Saturations on Room Air while Ambulating = Not attempted  Patient Saturations on 5 Liters of oxygen at rest = 91%  Please briefly explain why patient needs home oxygen: Pt is unable to maintain O2 sats >68% on RA at rest.   Rolinda Roan, PT, DPT Acute Rehabilitation Services Pager: [phone] Office: [phone]

## 2018-03-13 DIAGNOSIS — I480 Paroxysmal atrial fibrillation: Secondary | ICD-10-CM

## 2018-03-13 LAB — CBC
HCT: 37.1 % (ref 36.0–46.0)
Hemoglobin: 11.1 g/dL — ABNORMAL LOW (ref 12.0–15.0)
MCH: 32.1 pg (ref 26.0–34.0)
MCHC: 29.9 g/dL — ABNORMAL LOW (ref 30.0–36.0)
MCV: 107.2 fL — ABNORMAL HIGH (ref 80.0–100.0)
Platelets: 221 10*3/uL (ref 150–400)
RBC: 3.46 MIL/uL — ABNORMAL LOW (ref 3.87–5.11)
RDW: 17.6 % — ABNORMAL HIGH (ref 11.5–15.5)
WBC: 27.1 10*3/uL — ABNORMAL HIGH (ref 4.0–10.5)
nRBC: 5.1 % — ABNORMAL HIGH (ref 0.0–0.2)

## 2018-03-13 LAB — RENAL FUNCTION PANEL
Albumin: 2.3 g/dL — ABNORMAL LOW (ref 3.5–5.0)
Anion gap: 14 (ref 5–15)
BUN: 39 mg/dL — ABNORMAL HIGH (ref 6–20)
CO2: 24 mmol/L (ref 22–32)
Calcium: 9 mg/dL (ref 8.9–10.3)
Chloride: 99 mmol/L (ref 98–111)
Creatinine, Ser: 6.46 mg/dL — ABNORMAL HIGH (ref 0.44–1.00)
GFR calc Af Amer: 7 mL/min — ABNORMAL LOW (ref >60)
GFR calc non Af Amer: 6 mL/min — ABNORMAL LOW (ref >60)
Glucose, Bld: 188 mg/dL — ABNORMAL HIGH (ref 70–99)
Phosphorus: 3.4 mg/dL (ref 2.5–4.6)
Potassium: 3.1 mmol/L — ABNORMAL LOW (ref 3.5–5.1)
Sodium: 137 mmol/L (ref 135–145)

## 2018-03-13 LAB — GLUCOSE, CAPILLARY
Glucose-Capillary: 120 mg/dL — ABNORMAL HIGH (ref 70–99)
Glucose-Capillary: 234 mg/dL — ABNORMAL HIGH (ref 70–99)
Glucose-Capillary: 258 mg/dL — ABNORMAL HIGH (ref 70–99)

## 2018-03-13 MED ORDER — LIDOCAINE HCL (PF) 1 % IJ SOLN: 5.0000 mL | INTRAMUSCULAR | Status: DC | PRN

## 2018-03-13 MED ORDER — HEPARIN SODIUM (PORCINE) 1000 UNIT/ML DIALYSIS: 1000.0000 [IU] | INTRAMUSCULAR | Status: DC | PRN

## 2018-03-13 MED ORDER — SODIUM CHLORIDE 0.9 % IV SOLN: 100.0000 mL | INTRAVENOUS | Status: DC | PRN

## 2018-03-13 MED ORDER — ALTEPLASE 2 MG IJ SOLR
2.0000 mg | Freq: Once | INTRAMUSCULAR | Status: DC | PRN
  Filled 2018-03-13: qty 2

## 2018-03-13 MED ORDER — MIDODRINE HCL 5 MG PO TABS
ORAL_TABLET | ORAL | Status: AC
  Filled 2018-03-13: qty 2

## 2018-03-13 MED ORDER — CALCITRIOL 0.5 MCG PO CAPS
ORAL_CAPSULE | ORAL | Status: AC
  Administered 2018-03-13: 0.5 ug
  Filled 2018-03-13: qty 2

## 2018-03-13 MED ORDER — CALCITRIOL 0.25 MCG PO CAPS
ORAL_CAPSULE | ORAL | Status: AC
  Administered 2018-03-13: 17:00:00
  Filled 2018-03-13: qty 1

## 2018-03-13 MED ORDER — PENTAFLUOROPROP-TETRAFLUOROETH EX AERO: 1.0000 "application " | INHALATION_SPRAY | CUTANEOUS | Status: DC | PRN

## 2018-03-13 MED ORDER — LIDOCAINE-PRILOCAINE 2.5-2.5 % EX CREA: 1.0000 "application " | TOPICAL_CREAM | CUTANEOUS | Status: DC | PRN

## 2018-03-13 MED ORDER — HEPARIN SODIUM (PORCINE) 1000 UNIT/ML DIALYSIS: 20.0000 [IU]/kg | INTRAMUSCULAR | Status: DC | PRN

## 2018-03-13 NOTE — Progress Notes (Signed)
RT placed patient on CPAP HS. 4L O2 bleed in needed. Patient tolerating well at this time.  

## 2018-03-13 NOTE — Progress Notes (Signed)
OT Cancellation Note  Patient Details Name: Rachel Chaney MRN: [id number] DOB: April 12, 1960   Cancelled Treatment:    Reason Eval/Treat Not Completed: Patient at procedure or test/ unavailable. Pt off unit for HD, will check back next appropriate/available time  Britt Bottom 03/13/2018, 9:57 AM

## 2018-03-13 NOTE — Progress Notes (Signed)
[facility] Progress Note   [facility] MWF   4h  400/800  109kg  2/2 bath  LUE AVF  Hep 8000 + 2000 prn - no mircera - calcitriol 1.75 ug tiw Last Labs:1/20Hgb 12.1, TSAT30.0, K4.7, Ca8.2, P6.3, PTH1028.0, Alb3.6.   CXR 3/1 bilat lower lobe consolidation/ atx, no edema  Assessment/Plan: 1. PNA 2/27 - abtx per primary 2. ESRD - MWF HD. HD today. K+ running low, probably change to 3 K bath at d/c 3. Anemia - hgb > 10, stable - not on ESA 4. Secondary hyperparathyroidism - P ok on Aurxyia, calcitriol here at 0.25 lower than outpatient dose of calcitriol dose of 1.75 - corr Ca 9.4 - will increase calcitriol to 1.25 for now 5. HTN/volume - lower edw at dc, stable new lower wt's around 98kg here, low gains 6. Nutrition - alb 2.2 with wt loss 7. PVD - s/p right foot toe amp x 2 /19 8. CO2 retention/OSA/OHS - on nocturnal CPAP - refused last night- not using at home 9.  DM2 - on insulin glargine and SSI 10. Afib brief episode after cardiac arrest 2/20 - cards following - unclear if arrest was ventricular arrhythmia, PEA or asystole, vs respiratory event - Echo with normal EF- holding BP meds - on midodrine 10 tid 11. Osteomyelitis R 2nd/3rd toes: admission diagnosis, S/p 2nd/3rd ray amputation 2/19    [facility] 03/13/2018, 12:51 PM    Subjective:   Seen on HD no c/o's today  Objective Vitals:   03/13/18 1100 03/13/18 1130 03/13/18 1137 03/13/18 1215  BP: (!) 118/50 (!) 106/44 (!) 111/53 (!) 103/51  Pulse: 91 88 93 72  Resp:   (!) 21 (!) 22  Temp:   97.9 F (36.6 C) 98.6 F (37 C)  TempSrc:   Oral Oral  SpO2:   97% 98%  Weight:   98 kg   Height:       Physical Exam General: alert on HD, no distress, calm, gen'd weakness Heart: RRR Lungs: clear bialt no rales, decreased at bases Abdomen: obese soft NT +BS Extremities: no sig LE edema - right foot wrapped with ACE Dialysis Access: left upper AVF + bruit   Additional  Objective Labs: Basic Metabolic Panel: Recent Labs  Lab 03/10/18 0751 03/11/18 0504 03/12/18 0547 03/13/18 0532  NA 137 135 138 137  K 3.7 3.5 3.1* 3.1*  CL 98 100 101 99  CO2 18* 20* 23 24  GLUCOSE 170* 197* 135* 188*  BUN 32* 52* 21* 39*  CREATININE 5.30* 7.05* 4.29* 6.46*  CALCIUM 9.1 8.7* 8.5* 9.0  PHOS 4.7* 4.2  --  3.4   Liver Function Tests: Recent Labs  Lab 03/10/18 0751 03/11/18 0504 03/13/18 0532  ALBUMIN 2.4* 2.2* 2.3*   No results for input(s): LIPASE, AMYLASE in the last 168 hours. CBC: Recent Labs  Lab 03/09/18 1124 03/10/18 0751 03/11/18 0504 03/12/18 0547 03/13/18 0532  WBC 34.4* 32.4* 30.4* 28.8* 27.1*  HGB 11.3* 11.1* 10.6* 10.8* 11.1*  HCT 38.3 37.5 34.8* 36.4 37.1  MCV 107.3* 106.8* 104.2* 104.9* 107.2*  PLT 304 274 260 231 221   Blood Culture    Component Value Date/Time   SDES BLOOD RIGHT HAND 03/10/2018 0843   SPECREQUEST  03/10/2018 0843    AEROBIC BOTTLE ONLY Blood Culture results may not be optimal due to an inadequate volume of blood received in culture bottles   CULT  03/10/2018 0843    NO GROWTH 3 DAYS Performed  at [facility], [place] [address], [place], [place] [address]    REPTSTATUS PENDING 03/10/2018 1550    Cardiac Enzymes: No results for input(s): CKTOTAL, CKMB, CKMBINDEX, TROPONINI in the last 168 hours. CBG: Recent Labs  Lab 03/12/18 0723 03/12/18 1057 03/12/18 1602 03/12/18 2137 03/13/18 1214  GLUCAP 129* 165* 260* 131* 120*   Iron Studies: No results for input(s): IRON, TIBC, TRANSFERRIN, FERRITIN in the last 72 hours. Lab Results  Component Value Date   INR 1.2 03/06/2018   INR 1.5 (H) 03/05/2018   INR 1.27 03/04/2018   Studies/Results: No results found. Medications: . sodium chloride Stopped (03/06/18 1911)  . sodium chloride     . atorvastatin  80 mg Per Tube Daily  . calcitRIOL      . calcitRIOL      . calcitRIOL  1.25 mcg Oral Q M,W,F-HD  . chlorhexidine gluconate (MEDLINE KIT)  15 mL  Mouth Rinse BID  . Chlorhexidine Gluconate Cloth  6 each Topical Q0600  . diclofenac sodium  4 g Topical QID  . feeding supplement (ENSURE ENLIVE)  237 mL Oral BID BM  . feeding supplement (PRO-STAT SUGAR FREE 64)  30 mL Oral BID  . ferric citrate  420 mg Oral TID WC  . heparin injection (subcutaneous)  5,000 Units Subcutaneous Q8H  . insulin aspart  0-20 Units Subcutaneous TID WC  . insulin glargine  25 Units Subcutaneous Daily  . lactobacillus acidophilus  2 tablet Oral TID  . midodrine  10 mg Oral TID WC  . multivitamin  1 tablet Oral QHS  . pantoprazole  40 mg Oral Daily

## 2018-03-13 NOTE — Clinical Social Work Placement (Signed)
   CLINICAL SOCIAL WORK PLACEMENT  NOTE *03/13/18 - [facility]  Date:  03/13/2018  Patient Details  Name: Rachel Chaney MRN: [id number] Date of Birth: [birth year]  Clinical Social Work is seeking post-discharge placement for this patient at the [place] level of care (*CSW will initial, date and re-position this form in  chart as items are completed):  Yes   Patient/family provided with [facility] Work Department's list of facilities offering this level of care within the geographic area requested by the patient (or if unable, by the patient's family).  Yes   Patient/family informed of their freedom to choose among providers that offer the needed level of care, that participate in Medicare, Medicaid or managed care program needed by the patient, have an available bed and are willing to accept the patient.  Yes   Patient/family informed of [facility]'s ownership interest in [facility] and [facility], as well as of the fact that they are under no obligation to receive care at these facilities.  PASRR submitted to EDS on 03/08/18     PASRR number received on 03/08/18     Existing PASRR number confirmed on       FL2 transmitted to all facilities in geographic area requested by pt/family on 03/08/18     FL2 transmitted to all facilities within larger geographic area on       Patient informed that his/her managed care company has contracts with or will negotiate with certain facilities, including the following:        Yes(Bed offered given to patient and daughter [name] on 03/12/18)   Patient/family informed of bed offers received.  Patient chooses bed at [facility] recommends and patient chooses bed at      Patient to be transferred to [facility] on  .  Patient to be transferred to facility by       Patient family notified on   of transfer.  Name of family member  notified:        PHYSICIAN       Additional Comment:    _______________________________________________ Sable Feil, LCSW 03/13/2018, 5:09 PM

## 2018-03-13 NOTE — Progress Notes (Addendum)
Family Medicine Teaching Service Daily Progress Note Intern Pager: [phone]  Patient name: Rachel Chaney Medical record number: [id number] Date of birth: 1960/10/22 Age: 58 y.o. Gender: female  Primary Care Provider: Bufford Lope, DO Consultants: Nephrology, Orthopedics, CCM,cardiology Code Status: Full code  Pt Overview and Major Events to Date:  2/17 > admit 2/19 > Toe amputation on right foot 2/20 > Cardiac arrest, intubation, transferred to ICU 2/20 > started on heparin gtt empirically for presumed PE (IV team noted thrombus in RUE cephalic vein) 2/72: Extubated 2/22: Nursing staff reporting more lethargic, had been transferred to medicine service, new hypercarbic respiratory failure.  Placed on BiPAP 2/23: Remains off blood pressure medications for several days now in spite of what it says on MAR.  Hemodynamically stable.  Much more awake.  Suspect episode on 2/23 primarily due to hypercarbia. 2/26: AMS with persistent hypotension. Started midodrine. 2/27: CTA demonstrates bilateral consolidation c/f PNA, no PE. D/C anticoagulation.   Antibiotic course: Vanc (2/17- 2/21) Ceftriaxone (2/17-2/21) Ancef (2/17; 2/19; 2/21-2/26) Metronidazole (2/21-2/24) Cefepime (2/26 - )   Assessment and Plan: Rachel Chaney a 58 y.o.femalepresenting with painful 2nd toe of R foot. PMH is significant forESRD on HD MWF,T2DM,HTN, GERD, CHF, and dyslipidemia. Now found to have ventilator acquired pneumonia.  #Pneumonia-secondary to ventilator use-improving Normal respiratory rate overnight with an increasing oxygen requirement from 3 to 5 L nasal cannula. Blood pressures appear to be returning to normal limits with mild hypertension overnight for systolics up to 536. - Wean oxygen as tolerated - Begin restarting home medications, currently holding home hydral, carvedilol due to hypotension - Continue midodrine 10 mg tid -Stable to discharge to SNF   #End-stage renal disease on HD,  MWF-stable No further episodes of hypotension overnight. - Dialysis per nephro - Nephro following, appreciate recs - Renal/carb diet   #HFpEF: -Stable - Follow-up with cardiology for outpatient nuclear stress study and Holter monitor - Holding home carvedilol as above  #Transient A-fib -no further episodes-resolved -Continue to monitor  #Acute hypoxic and hypercarbic respiratory failure, improving Multifactorial with likely underlying OSA, OHS and pneumonia.  Supplemental oxygen stable at increased to 5 L overnight. - Wean O2, titrate for sat 88-92% - Mandatory BiPap at night - PT/OT recommending SNF - Sleep study as outpatient   #Multiple rib fractures -stable Her only complaint this morning was ongoing back pain. - Continue voltaren gel - Schedule Tylenol q6h  - Avoid PO NSAIDs  #S/p cardiac arrest 2/20 - unknown etiology - Telemetry - Full code status  #Amputation 2nd and 3rd R toes 2/19  -No further antibiotics following dose of cefepime today (3/3) -Encourage OOB  #T2DM:Stable HbA1c 11.4% (2/18).  Blood sugars from 1 35-260 overnight.  Lantus 25 given the evening of 3/3 with 18 units aspart given on 3/3. - Continue Lantus to 25 u  - Continue resistant SSI - CBG monitoring 4 times daily  #Diarrhea, improved Likely secondary to prolonged course of antibiotics.  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 Was receiving treatment as outpatient.  - Nephro following, appreciate recs - Continue home calcitriol and auryxia  #Chronic hypertension: Blood pressure is rising to the normal hypertensive state. -consider DC of midodrine and restarting home meds once diastolic pressure improves  #Hypoalbuminemia Albumin 2.6 on admission. No evidence of liver disease. Urinalysis from 2017 with > 300 protein indicating that there is likely a nephrotic syndrome component to her hypoalbuminemia. Protein calorie malnutrition given  poor diet  could also be contributing.  - Nephro following, appreciate recs  #Anemia of CKD:   Hemoglobin 10.8 on 3/3. - smear review pending  #Hyperlipidemia:  - Continue atorvastatin 80 mg  #GERD: Stable -Protonix 40 daily -Mylanta prn  FEN/GI: Carb/renal diet PPx: heparin  Disposition: Stable to discharge to SNF  Subjective:  Per conversation in the afternoon of 3/3, Rachel Chaney and her daughter are interested in going to a skilled nursing facility for temporary rehab to regain strength and mobility following her cardiac arrest.  She was seen in dialysis this morning reported speaking with her husband who also agrees that a temporary stay in a skilled nursing facility would be helpful.  She had no new complaints this morning apart from her ongoing mild chest/back pain from her CPR.  Objective: Temp:  [97.8 F (36.6 C)-98.4 F (36.9 C)] 97.8 F (36.6 C) (03/04 0424) Pulse Rate:  [79-89] 86 (03/04 0424) Resp:  [18-20] 18 (03/04 0424) BP: (115-154)/(55-74) 128/64 (03/04 0424) SpO2:  [95 %-98 %] 98 % (03/04 0424)   Physical Exam: General: Lying in bed comfortably no acute distress watching TV at dialysis. HEENT: Neck non-tender without lymphadenopathy, masses or thyromegaly Cardio: Distant heart sounds.  Regular rate and rhythm from radial pulse. Pulm: Difficult pulmonary auscultation no rhonchi were noted.  Breathing comfortably on 4 L nasal cannula. Abdomen: Bowel sounds normal. Abdomen soft and non-tender.  Extremities: No peripheral edema. Warm/ well perfused.  Strong radial pulse. Neuro: Cranial nerves grossly intact  Laboratory: Recent Labs  Lab 03/10/18 0751 03/11/18 0504 03/12/18 0547  WBC 32.4* 30.4* 28.8*  HGB 11.1* 10.6* 10.8*  HCT 37.5 34.8* 36.4  PLT 274 260 231   Recent Labs  Lab 03/10/18 0751 03/11/18 0504 03/12/18 0547  NA 137 135 138  K 3.7 3.5 3.1*  CL 98 100 101  CO2 18* 20* 23  BUN 32* 52* 21*  CREATININE 5.30* 7.05* 4.29*  CALCIUM  9.1 8.7* 8.5*  GLUCOSE 170* 197* 135*    Imaging/Diagnostic Tests: No results found.   Matilde Haymaker, MD 03/13/2018, 6:26 AM [facility] Intern pager: [phone], text pages welcome

## 2018-03-14 ENCOUNTER — Telehealth: Payer: Self-pay

## 2018-03-14 ENCOUNTER — Inpatient Hospital Stay (INDEPENDENT_AMBULATORY_CARE_PROVIDER_SITE_OTHER): Payer: Commercial Managed Care - HMO | Admitting: Orthopedic Surgery

## 2018-03-14 LAB — BASIC METABOLIC PANEL
Anion gap: 12 (ref 5–15)
BUN: 26 mg/dL — ABNORMAL HIGH (ref 6–20)
CO2: 25 mmol/L (ref 22–32)
Calcium: 8.9 mg/dL (ref 8.9–10.3)
Chloride: 98 mmol/L (ref 98–111)
Creatinine, Ser: 4.58 mg/dL — ABNORMAL HIGH (ref 0.44–1.00)
GFR calc Af Amer: 11 mL/min — ABNORMAL LOW (ref >60)
GFR calc non Af Amer: 10 mL/min — ABNORMAL LOW (ref >60)
Glucose, Bld: 217 mg/dL — ABNORMAL HIGH (ref 70–99)
Potassium: 4 mmol/L (ref 3.5–5.1)
Sodium: 135 mmol/L (ref 135–145)

## 2018-03-14 LAB — CBC
HCT: 36.4 % (ref 36.0–46.0)
Hemoglobin: 10.9 g/dL — ABNORMAL LOW (ref 12.0–15.0)
MCH: 32.2 pg (ref 26.0–34.0)
MCHC: 29.9 g/dL — ABNORMAL LOW (ref 30.0–36.0)
MCV: 107.4 fL — ABNORMAL HIGH (ref 80.0–100.0)
Platelets: 161 10*3/uL (ref 150–400)
RBC: 3.39 MIL/uL — ABNORMAL LOW (ref 3.87–5.11)
RDW: 17.9 % — ABNORMAL HIGH (ref 11.5–15.5)
WBC: 25.3 10*3/uL — ABNORMAL HIGH (ref 4.0–10.5)
nRBC: 4.2 % — ABNORMAL HIGH (ref 0.0–0.2)

## 2018-03-14 LAB — GLUCOSE, CAPILLARY
Glucose-Capillary: 188 mg/dL — ABNORMAL HIGH (ref 70–99)
Glucose-Capillary: 269 mg/dL — ABNORMAL HIGH (ref 70–99)
Glucose-Capillary: 291 mg/dL — ABNORMAL HIGH (ref 70–99)

## 2018-03-14 MED ORDER — INSULIN ASPART 100 UNIT/ML ~~LOC~~ SOLN: 0.0000 [IU] | Freq: Three times a day (TID) | SUBCUTANEOUS | 11 refills | Status: DC

## 2018-03-14 MED ORDER — CHLORHEXIDINE GLUCONATE CLOTH 2 % EX PADS: 6.0000 | MEDICATED_PAD | Freq: Every day | CUTANEOUS | Status: DC

## 2018-03-14 MED ORDER — CALCITRIOL 0.25 MCG PO CAPS: 1.2500 ug | ORAL_CAPSULE | ORAL | 0 refills | Status: DC

## 2018-03-14 MED ORDER — PRO-STAT SUGAR FREE PO LIQD: 30.0000 mL | Freq: Two times a day (BID) | ORAL | 0 refills | Status: DC

## 2018-03-14 MED ORDER — INSULIN DETEMIR 100 UNIT/ML FLEXPEN: 12.5000 [IU] | PEN_INJECTOR | Freq: Two times a day (BID) | SUBCUTANEOUS | 3 refills | Status: DC

## 2018-03-14 MED ORDER — DICLOFENAC SODIUM 1 % TD GEL: 4.0000 g | Freq: Four times a day (QID) | TRANSDERMAL | 0 refills | Status: DC

## 2018-03-14 MED ORDER — PANTOPRAZOLE SODIUM 40 MG PO TBEC: 40.0000 mg | DELAYED_RELEASE_TABLET | Freq: Every day | ORAL | 0 refills | Status: DC

## 2018-03-14 MED ORDER — WHITE PETROLATUM EX OINT
TOPICAL_OINTMENT | CUTANEOUS | Status: AC
  Administered 2018-03-14: 1
  Filled 2018-03-14: qty 28.35

## 2018-03-14 MED ORDER — MIDODRINE HCL 10 MG PO TABS: 10.0000 mg | ORAL_TABLET | Freq: Three times a day (TID) | ORAL | 0 refills | Status: DC

## 2018-03-14 NOTE — Clinical Social Work Placement (Signed)
   CLINICAL SOCIAL WORK PLACEMENT  NOTE *03/14/18 - DISCHARGED TO [facility] VIA [facility]BULANCE  Date:  03/14/2018  Patient Details  Name: Rachel Chaney MRN: [id number] Date of Birth: Feb 15, 1960  Clinical Social Work is seeking post-discharge placement for this patient at the [place] level of care (*CSW will initial, date and re-position this form in  chart as items are completed):  Yes   Patient/family provided with [facility] Work Department's list of facilities offering this level of care within the geographic area requested by the patient (or if unable, by the patient's family).  Yes   Patient/family informed of their freedom to choose among providers that offer the needed level of care, that participate in Medicare, Medicaid or managed care program needed by the patient, have an available bed and are willing to accept the patient.  Yes   Patient/family informed of [facility]'s ownership interest in [facility] and [facility], as well as of the fact that they are under no obligation to receive care at these facilities.  PASRR submitted to EDS on 03/08/18     PASRR number received on 03/08/18     Existing PASRR number confirmed on       FL2 transmitted to all facilities in geographic area requested by pt/family on 03/08/18     FL2 transmitted to all facilities within larger geographic area on       Patient informed that his/her managed care company has contracts with or will negotiate with certain facilities, including the following:        Yes(Bed offered given to patient and daughter [name] on 03/12/18)   Patient/family informed of bed offers received.  Patient chooses bed at [facility] recommends and patient chooses bed at      Patient to be transferred to [facility] on  03/14/18.  Patient to be transferred to facility by  ambulance     Patient family notified on  03/14/18 of  transfer.  Name of family member notified:   Daughter [name] [phone])     PHYSICIAN       Additional Comment:    _______________________________________________ Sable Feil, LCSW 03/14/2018, 4:34 PM

## 2018-03-14 NOTE — Progress Notes (Signed)
Occupational Therapy Treatment Patient Details Name: Rachel Chaney MRN: [id number] DOB: [birth year] Today's Date: 03/14/2018    History of present illness 58 year old female who admitted for crush injury of foot. 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 Wound culture grew S. Aureus with sensitivities pending. During dialysis, patient was found unresponsive. CPR was performed, she was intubated 2/20 and transferred to ICU. Presumed PE (IV team noted thrombus in RUE cephalic vein) Extubated 01/28/39 PMH: ESRD on HD, HTN, DM2, GERD   OT comments  Pt making good progress with functional goals with improved bed mobility to sit EOB, sit - stand for SPTs to [facility] and with ADL tasks seated EOB. Pt dies require increased time and effort, multimodal cues for correct hand placement and sequencing for ADL mobility tasks. OT will continue to follow acutely  Follow Up Recommendations  SNF;Supervision/Assistance - 24 hour    Equipment Recommendations  Other (comment)(TBD at next venue of care)    Recommendations for Other Services      Precautions / Restrictions Precautions Precautions: Fall Restrictions Weight Bearing Restrictions: Yes RLE Weight Bearing: Non weight bearing       Mobility Bed Mobility Overal bed mobility: Needs Assistance Bed Mobility: Supine to Sit;Sit to Supine     Supine to sit: Min assist;HOB elevated Sit to supine: Min assist   General bed mobility comments: used rail, increased time, min A to elevate trunk. Pt had difficulty with instructions to sccot to [facility] while sitting EOB and required demonstration to correctly initiate  Transfers Overall transfer level: Needs assistance Equipment used: Rolling walker (2 wheeled) Transfers: Sit to/from [facility] Sit to Stand: Mod assist Stand pivot transfers: Mod assist       General transfer comment: multimodal cues for correct hand placement and NWB, 3 attempts to stand from  EOB    Balance                                           ADL either performed or assessed with clinical judgement   ADL Overall ADL's : Needs assistance/impaired     Grooming: Wash/dry hands;Wash/dry face;Supervision/safety   Upper Body Bathing: Supervision/ safety;Set up;Sitting Upper Body Bathing Details (indicate cue type and reason): simulated     Upper Body Dressing : Min guard;Sitting       Toilet Transfer: Moderate assistance;Cueing for safety;Cueing for sequencing;RW;BSC;Stand-pivot   Toileting- Clothing Manipulation and Hygiene: Maximal assistance;Sit to/from stand       Functional mobility during ADLs: Moderate assistance;Cueing for safety;Rolling walker General ADL Comments: increased time and effort, multimodal cues for correct hand placement and sequencing     Vision Baseline Vision/History: Wears glasses Patient Visual Report: No change from baseline     Perception     Praxis      Cognition Arousal/Alertness: Awake/alert Behavior During Therapy: WFL for tasks assessed/performed Overall Cognitive Status: Impaired/Different from baseline Area of Impairment: Orientation;Attention;Memory;Following commands;Awareness;Safety/judgement;Problem solving                     Memory: Decreased short-term memory;Decreased recall of precautions Following Commands: Follows one step commands inconsistently;Follows one step commands with increased time Safety/Judgement: Decreased awareness of deficits;Decreased awareness of safety   Problem Solving: Slow processing;Difficulty sequencing;Requires verbal cues;Requires tactile cues General Comments: Unable to follow NWB status and required repetition of cues/commands for pt  to follow, difficulty with correct hand placement for sit -stand transitions        Exercises     Shoulder Instructions       General Comments      Pertinent Vitals/ Pain       Pain Assessment: No/denies  pain Faces Pain Scale: No hurt Pain Intervention(s): Monitored during session  Home Living                                          Prior Functioning/Environment              Frequency  Min 2X/week        Progress Toward Goals  OT Goals(current goals can now be found in the care plan section)  Progress towards OT goals: Progressing toward goals  Acute Rehab OT Goals Patient Stated Goal: go to rehab  Plan Discharge plan remains appropriate    Co-evaluation                 AM-PAC OT "6 Clicks" Daily Activity     Outcome Measure   Help from another person eating meals?: None Help from another person taking care of personal grooming?: A Little Help from another person toileting, which includes using toliet, bedpan, or urinal?: A Lot Help from another person bathing (including washing, rinsing, drying)?: A Lot Help from another person to put on and taking off regular upper body clothing?: A Little Help from another person to put on and taking off regular lower body clothing?: Total 6 Click Score: 15    End of Session Equipment Utilized During Treatment: Gait belt;Oxygen;Other (comment);Rolling walker(R Darco shoe)  OT Visit Diagnosis: Other abnormalities of gait and mobility (R26.89);Muscle weakness (generalized) (M62.81);Pain;Other symptoms and signs involving cognitive function   Activity Tolerance Patient tolerated treatment well   Patient Left with call bell/phone within reach;with family/visitor present;in bed   Nurse Communication          Time: 6384-5364 OT Time Calculation (min): 25 min  Charges: OT General Charges $OT Visit: 1 Visit OT Treatments $Self Care/Home Management : 98-112 mins     Britt Bottom 03/14/2018, 12:29 PM

## 2018-03-14 NOTE — Progress Notes (Signed)
Patient refused bipap. Took it off from face. Oxygen at 2LPM via [place] restarted. Patient alert and oriented x 2-3.

## 2018-03-14 NOTE — Telephone Encounter (Signed)
Fax from [facility] asking to clarify dosing instructions for Levemir.  Please confirm directions of 12.5 units BID.  Call back is [phone]  Danley Danker, RN [facility] [place])

## 2018-03-14 NOTE — Progress Notes (Addendum)
[facility] Progress Note   Subjective:   Patient seen and examined at bedside with husband present.  Denies SOB, CP, n/v/d, fever, chills and edema.  No specific complaints today.     Objective Vitals:   03/13/18 1608 03/13/18 2034 03/14/18 0532 03/14/18 0930  BP: (!) 129/54 121/65 128/62 131/67  Pulse: 96 87 86 88  Resp: '20 18 18 18  ' Temp: 98.5 F (36.9 C) 98.3 F (36.8 C) 98.3 F (36.8 C)   TempSrc: Oral Oral Oral   SpO2: 92% 95% 100% 99%  Weight:  98 kg    Height:       Physical Exam General:NAD, obese, sleepy female Heart:RRR Lungs:mostly CTAB, BS decreased at bases, nml WOB on 1L O2 via [place] Abdomen:soft, NTND, +BS Extremities:trace LE edema, R foot wrapped Dialysis Access: LU AVF +b/t   Filed Weights   03/13/18 0732 03/13/18 1137 03/13/18 2034  Weight: 98.8 kg 98 kg 98 kg    Intake/Output Summary (Last 24 hours) at 03/14/2018 1128 Last data filed at 03/14/2018 0830 Gross per 24 hour  Intake 977 ml  Output 927 ml  Net 50 ml    Additional Objective Labs: Basic Metabolic Panel: Recent Labs  Lab 03/10/18 0751 03/11/18 0504 03/12/18 0547 03/13/18 0532 03/14/18 0314  NA 137 135 138 137 135  K 3.7 3.5 3.1* 3.1* 4.0  CL 98 100 101 99 98  CO2 18* 20* '23 24 25  ' GLUCOSE 170* 197* 135* 188* 217*  BUN 32* 52* 21* 39* 26*  CREATININE 5.30* 7.05* 4.29* 6.46* 4.58*  CALCIUM 9.1 8.7* 8.5* 9.0 8.9  PHOS 4.7* 4.2  --  3.4  --    Liver Function Tests: Recent Labs  Lab 03/10/18 0751 03/11/18 0504 03/13/18 0532  ALBUMIN 2.4* 2.2* 2.3*   CBC: Recent Labs  Lab 03/10/18 0751 03/11/18 0504 03/12/18 0547 03/13/18 0532 03/14/18 0314  WBC 32.4* 30.4* 28.8* 27.1* 25.3*  HGB 11.1* 10.6* 10.8* 11.1* 10.9*  HCT 37.5 34.8* 36.4 37.1 36.4  MCV 106.8* 104.2* 104.9* 107.2* 107.4*  PLT 274 260 231 221 161   Blood Culture    Component Value Date/Time   SDES BLOOD RIGHT HAND 03/10/2018 0843   SPECREQUEST  03/10/2018 0843    AEROBIC BOTTLE ONLY Blood  Culture results may not be optimal due to an inadequate volume of blood received in culture bottles   CULT  03/10/2018 0843    NO GROWTH 3 DAYS Performed at [facility], [place] [address], [place], [place] [address]    REPTSTATUS PENDING 03/10/2018 0843   CBG: Recent Labs  Lab 03/13/18 1214 03/13/18 1609 03/13/18 2033 03/14/18 0728 03/14/18 1101  GLUCAP 120* 234* 258* 188* 269*   Medications: . sodium chloride Stopped (03/06/18 1911)  . sodium chloride     . atorvastatin  80 mg Per Tube Daily  . calcitRIOL  1.25 mcg Oral Q M,W,F-HD  . chlorhexidine gluconate (MEDLINE KIT)  15 mL Mouth Rinse BID  . Chlorhexidine Gluconate Cloth  6 each Topical Q0600  . diclofenac sodium  4 g Topical QID  . feeding supplement (ENSURE ENLIVE)  237 mL Oral BID BM  . feeding supplement (PRO-STAT SUGAR FREE 64)  30 mL Oral BID  . ferric citrate  420 mg Oral TID WC  . heparin injection (subcutaneous)  5,000 Units Subcutaneous Q8H  . insulin aspart  0-20 Units Subcutaneous TID WC  . insulin glargine  25 Units Subcutaneous Daily  . lactobacillus acidophilus  2  tablet Oral TID  . midodrine  10 mg Oral TID WC  . multivitamin  1 tablet Oral QHS  . pantoprazole  40 mg Oral Daily    Dialysis Orders:  4h  400/800 109kg 2/2 bath LUE AVF Hep 8000 + 2000 prn - no mircera - calcitriol 1.75 ug tiw Last Labs:1/20Hgb 12.1, TSAT30.0, K4.7, Ca8.2, P6.3, PTH1028.0, Alb3.6.  CXR 3/1 bilat lower lobe consolidation/ atx, no edema  Assessment/Plan: 1. PNA - shown on CXR 3/1- on ABX, per primary 2. ESRD -  HD MWF, plan for HD tomorrow pre regular schedule.  K 4.0, has been running low throughout admit, may need to ^bath to [facility] especially w/ d/c to SNF. 3. Anemia of CKD- Hgb 10.9. no indication for ESA at this time.  4. Secondary hyperparathyroidism - CCa 10.2, phos in goal. Pth^, continue VDRA (on lower dose in hospital d/t [place]as Lav), binders  5. HTN/volume - BP well controlled on midodrine 61m  TID.  If weights correct significantly under EDW.  Standing weights pre and post HD. Will need new EDW at d/c ~ 98kg.  6. Nutrition - Renal diet w/ fluid restrictions.  7. PVD/Osteomyelitis R 2nd/3rd toes - s/p amputation on 2/19 9. OSA/CO2 retention - refusing CPAP, per primary 10. DM2 - per primary 11. A fib - brief episode following cardiac arrest on 2/20 - cards following, ECHO w/nml EF 12. S/p cardiac arrest on 2/20 - etiology unknown 13. Multiple rib fractures 14. Dispo - awaiting SNF placement  LJen Mow PA-C [facility] Pager: [phone]/05/2018,11:28 AM  LOS: 17 days   Pt seen, examined and agree w A/P as above.  [facility] 03/14/2018, 12:10 PM

## 2018-03-15 ENCOUNTER — Non-Acute Institutional Stay (SKILLED_NURSING_FACILITY): Payer: Medicare Other | Admitting: Internal Medicine

## 2018-03-15 ENCOUNTER — Encounter: Payer: Self-pay | Admitting: Internal Medicine

## 2018-03-15 DIAGNOSIS — R918 Other nonspecific abnormal finding of lung field: Secondary | ICD-10-CM

## 2018-03-15 DIAGNOSIS — N186 End stage renal disease: Secondary | ICD-10-CM

## 2018-03-15 DIAGNOSIS — Z992 Dependence on renal dialysis: Secondary | ICD-10-CM

## 2018-03-15 DIAGNOSIS — J9691 Respiratory failure, unspecified with hypoxia: Secondary | ICD-10-CM | POA: Diagnosis not present

## 2018-03-15 DIAGNOSIS — D72829 Elevated white blood cell count, unspecified: Secondary | ICD-10-CM

## 2018-03-15 DIAGNOSIS — K219 Gastro-esophageal reflux disease without esophagitis: Secondary | ICD-10-CM

## 2018-03-15 DIAGNOSIS — J9692 Respiratory failure, unspecified with hypercapnia: Secondary | ICD-10-CM

## 2018-03-15 DIAGNOSIS — E1122 Type 2 diabetes mellitus with diabetic chronic kidney disease: Secondary | ICD-10-CM | POA: Diagnosis not present

## 2018-03-15 DIAGNOSIS — M869 Osteomyelitis, unspecified: Secondary | ICD-10-CM

## 2018-03-15 LAB — CULTURE, BLOOD (ROUTINE X 2)
Culture: NO GROWTH
Culture: NO GROWTH

## 2018-03-15 NOTE — Telephone Encounter (Signed)
I'm sorry you all had to address this without me.  I did try calling the pharmacy last night but they were closed.  I did not remember to call them again this morning.  I think your decisions were appropriate.  She should be on a lower dose(like the 13 BID you advised) of insulin for now and I expect her to have an increased insulin requirement as her diet changes outside of the hospital.

## 2018-03-15 NOTE — Progress Notes (Signed)
Location:    [facility] Room Number: 025/E Place of Service:  SNF [phone]) Provider:  Granville Lewis PA-C  Rachel Lope, Rachel Chaney  Patient Care Team: Rachel Lope, Rachel Chaney as PCP - General Stanford Breed Denice Bors, MD as PCP - Cardiology (Cardiology)  Extended Emergency Contact Information Primary Emergency Contact: Nilan,Albert Address: [address]          [place], [place] [address] Johnnette Litter of [place] Phone: [phone] Mobile Phone: [phone] Relation: Spouse  Code Status:  Full Code Goals of care: Advanced Directive information Advanced Directives 03/15/2018  Does Patient Have a Medical Advance Directive? Yes  Type of Advance Directive (No Data)  Does patient want to make changes to medical advance directive? No - Patient declined  Would patient like information on creating a medical advance directive? No - Patient declined  Pre-existing out of facility DNR order (yellow form or pink MOST form) -     Chief Complaint  Patient presents with  . Hospitalization Follow-up    Hospitalization F/U  Follow-up of complicated hospitalization with history of of right second and third toe amputation on the right foot complicated with cardiac arrest of unknown etiology- as well as pneumonia with hyper carpi respiratory failure.    HPI:  Pt is a 58 y.o. female seen today for a hospital f/u after a complicated hospitalization as noted above. Patient was admitted to the hospital on February 25, 2018 for right second toe fracture and soft tissue infection that was concerning for osteomyelitis.  On February 19 she had a second and third ray amputation on the right foot-she was noted to have a large purulent abscess at that time.  Cultures were positive for staph aureus she was treated with IV antibiotics.  February 20 she had a cardiac arrest of unknown etiology required intubation and transferred to ICU.  She was presumed to have a pulmonary  embolism secondary to decompensation with acute onset past surgery.  She was noted to have a thrombus in the right upper extremity cephalic vein.  CTA at that time was not performed because of her end-stage renal disease.  She was started on a heparin drip and extubated.  Next day she was noted to be more lethargic with new hypercarbic respiratory failure and was placed on BiPAP.  By February 23 she was more alert hemodynamically stable.  She continued to have some intermittent altered mental status and hypotension with new oxygen requirement.  She was started on Midodrine and receive cautious dialysis by nephrology.  Eventually a CTA was performed after consulting nephrology and was negative for PE but demonstrated bilateral consolidation suspicious for pneumonia-her anticoagulation was stopped.  She did receive IV cefepime I am which she completed on March 3.  On hospital discharge she had completed her antibiotics and shown significant improvement in respiratory status and blood pressure  e. Her other diagnoses include type 2 diabetes she is on Levemir 13 units twice daily blood sugar this afternoon is in the mid 200s   She is here for rehab.  Currently she is sitting on the side of her bed comfortably is about to eat  Lunch--- does not have any acute complaints continues to have some cough- Vital signs appear to be stable    Past Medical History:  Diagnosis Date  . CHF (congestive heart failure) ([place])   . Chronic kidney disease   . Chronic kidney disease due to diabetes mellitus ([place]) 10/13/2013  .  Diabetes mellitus   . Hyperlipidemia   . Hypertension    Past Surgical History:  Procedure Laterality Date  . AMPUTATION Right 02/27/2018   Procedure: RIGHT FOOT 2ND AND 3RD RAY AMPUTATION;  Surgeon: [name], MD;  Location: [facility];  Service: Orthopedics;  Laterality: Right;  . AV FISTULA PLACEMENT Left 04/15/2015   Procedure: ARTERIOVENOUS (AV) FISTULA CREATION;   Surgeon: Mal Misty, MD;  Location: [facility];  Service: Vascular;  Laterality: Left;  . EYE SURGERY Right    retinal detachment  . FISTULA SUPERFICIALIZATION Left 06/03/2015   Procedure: LEFT UPPER ARM BRACHIOCEPHALIC FISTULA SUPERFICIALIZATION;  Surgeon: Mal Misty, MD;  Location: [facility];  Service: Vascular;  Laterality: Left;  from [place] to General    No Known Allergies  Allergies as of 03/15/2018   No Known Allergies     Medication List       Accurate as of March 15, 2018  8:53 AM. Always use your most recent med list.        acetaminophen 500 MG tablet Commonly known as:  TYLENOL Take 1,000 mg by mouth every 6 (six) hours as needed for pain.   atorvastatin 80 MG tablet Commonly known as:  LIPITOR Take 1 tablet (80 mg total) by mouth daily.   bisacodyl 10 MG suppository Commonly known as:  DULCOLAX Constipation : give 10 mg Bisacodyl suppositiory rectally X 1 dose in 24 hours as needed (Rachel Chaney not use constipation standing orders for residents with renal failure/CFR less than 30. Contact MD for orders)   cyclobenzaprine 10 MG tablet Commonly known as:  FLEXERIL Take 1 tablet (10 mg total) by mouth at bedtime.   diclofenac sodium 1 % Gel Commonly known as:  VOLTAREN Apply 4 g topically 4 (four) times daily.   feeding supplement (PRO-STAT SUGAR FREE 64) Liqd Take 30 mLs by mouth 2 (two) times daily.   fluticasone 50 MCG/ACT nasal spray Commonly known as:  FLONASE Place 1 spray into both nostrils daily. 1 spray in each nostril every day   glucose blood test strip Commonly known as:  ONE TOUCH ULTRA TEST 1 each by Other route 3 (three) times daily. ICD-10 code: E11.40.   hydrOXYzine 50 MG tablet Commonly known as:  ATARAX/VISTARIL Take 50 mg by mouth 2 (two) times daily as needed for itching.   Insulin Detemir 100 UNIT/ML Pen Commonly known as:  Levemir FlexTouch Inject 12.5 Units into the skin 2 (two) times daily.   Insulin Pen Needle 29G X 12MM Misc Inject 1 pen  into the skin 2 (two) times daily. Check blood sugar daily.   Insulin Pen Needle 31G X 5 MM Misc Commonly known as:  B-D UF III MINI PEN NEEDLES USE AS DIRECTED FOR INSULIN INJECTION TWICE DAILY   midodrine 10 MG tablet Commonly known as:  PROAMATINE Take 1 tablet (10 mg total) by mouth 3 (three) times daily with meals.   multivitamin Tabs tablet Take 1 tablet by mouth at bedtime.   ONE TOUCH ULTRA 2 w/Device Kit 1 kit by Does not apply route 3 (three) times daily. ICD-10 code: E11.40   glucose monitoring kit monitoring kit 1 each by Does not apply route as needed for other.   OneTouch Delica Lancets 40J Misc 1 Device by Does not apply route as needed (to check blood glucose).   pantoprazole 40 MG tablet Commonly known as:  PROTONIX Take 1 tablet (40 mg total) by mouth daily.   RA SALINE ENEMA RE Constipation  If not relieved by Biscodyl suppository, give disposable Saline Enema rectally X 1 dose/24 hrs as needed (Rachel Chaney not use constipation standing orders for residents with renal failure/CFR less than 30. Contact MD for orders)   UltiCare Insulin Syringe 30G X 5/16" 1 ML Misc Generic drug:  Insulin Syringe-Needle U-100 USE AS DIRECTED       Review of Systems   General she is not complaining of any fever or chills.  Skin is not complaining of rashes or itching.  Head ears eyes nose mouth and throat does not complain of visual changes or sore throat.  Respiratory continues to have cough has been somewhat present since her hospitalization does not complain of increased shortness of breath she is on oxygen.  Cardiac is not complaining of chest pain or palpitations.  GI is not complaining of abdominal pain nausea vomiting diarrhea constipation.  GU does have end-stage renal disease on chronic dialysis does not complain of dysuria.  Musculoskeletal continues to have weakness does not complain of joint pain currently.  Neurologic does not complain of dizziness headache or  numbness does have weakness.  Psych does not complain of being overtly depressed or anxious does have somewhat of a flat affect.    Immunization History  Administered Date(s) Administered  . Influenza Split 10/14/2010  . Influenza Whole 10/19/2007, 11/20/2008, 10/11/2009  . Influenza,inj,Quad PF,6+ Mos 10/02/2012, 09/11/2013, 10/26/2014  . Influenza-Unspecified 10/10/2015, 10/17/2016  . Pneumococcal Polysaccharide-23 11/09/2004, 10/10/2015  . Td 01/09/2001  . Tdap 03/28/2011   Pertinent  Health Maintenance Due  Topic Date Due  . MAMMOGRAM  04/08/2012  . INFLUENZA VACCINE  04/15/2018 (Originally 08/09/2017)  . OPHTHALMOLOGY EXAM  04/15/2018 (Originally 04/09/2011)  . COLONOSCOPY  04/15/2018 (Originally 11/16/2008)  . FOOT EXAM  08/24/2018  . HEMOGLOBIN A1C  08/27/2018  . PAP SMEAR-Modifier  12/07/2018   Fall Risk  05/21/2017 05/21/2017 04/12/2017 08/22/2016 03/21/2016  Falls in the past year? _0   Number falls in past yr: - - - - -  Risk for fall due to : - - - - -  Follow up - - - - -   Functional Status Survey:    Vitals:   03/15/18 0826  BP: 138/74  Pulse: 74  Resp: 20  Temp: 98 F (36.7 C)  TempSrc: Oral  SpO2: 95%    Physical Exam In general this is a pleasant middle-age female in no distress sitting comfortably in the side of her bed.  Her skin is warm and dry.  Eyes visual acuity appears to be intact sclera and conjunctive are clear.  Oropharynx she does have food on her tongue again she has been eating.  Mucous membranes appear moist.  Chest has some scattered coarse sounds but this clears when she coughs there is no labored breathing she continues on oxygen.  Heart is regular rate and rhythm without murmur gallop or rub.  Abdomen is obese soft nontender with positive bowel sounds.  Musculoskeletal is able to move all extremities x4 she does have bandaging wrapping of her right foot status post amputation- per discussion with nursing there is  some bleeding but no sign of infection.  Neurologic appears grossly intact her speech is clear no lateralizing findings.  Psych she is alert and oriented pleasant and appropriate has somewhat of a flat affect.   Labs reviewed: Recent Labs    03/04/18 0438 03/05/18 0500  03/07/18 0543  03/10/18 8676 03/11/18 7209 03/12/18 0547 03/13/18 0532 03/14/18 0314  NA 135  135   < > 133*   < > 137 135 138 137 135  K 3.7 3.7   < > 4.0   < > 3.7 3.5 3.1* 3.1* 4.0  CL 95* 98   < > 96*   < > 98 100 101 99 98  CO2 23 22   < > 21*   < > 18* 20* _0 GLUCOSE 214* 251*   < > 252*   < > 170* 197* 135* 188* 217*  BUN 27* 27*   < > 59*   < > 32* 52* 21* 39* 26*  CREATININE 4.84* 3.99*   < > 7.64*   < > 5.30* 7.05* 4.29* 6.46* 4.58*  CALCIUM 8.3* 8.7*   < > 9.1   < > 9.1 8.7* 8.5* 9.0 8.9  MG 2.1 2.1  --  2.2  --   --   --   --   --   --   PHOS 5.7* 4.3   < > 4.9*   < > 4.7* 4.2  --  3.4  --    < > = values in this interval not displayed.   Recent Labs    02/25/18 1105  02/28/18 2232  03/10/18 0751 03/11/18 0504 03/13/18 0532  AST 15  --  145*  --   --   --   --   ALT 18  --  96*  --   --   --   --   ALKPHOS 85  --  173*  --   --   --   --   BILITOT 0.6  --  1.4*  --   --   --   --   PROT 6.9  --  6.6  --   --   --   --   ALBUMIN 2.6*   < > 2.1*   < > 2.4* 2.2* 2.3*   < > = values in this interval not displayed.   Recent Labs    02/25/18 1105  02/27/18 0801 02/28/18 0524  03/12/18 0547 03/13/18 0532 03/14/18 0314  WBC 27.0*   < > 32.1* 30.7*   < > 28.8* 27.1* 25.3*  NEUTROABS 22.7*  --  28.9* 26.4*  --   --   --   --   HGB 11.1*   < > 11.9* 11.2*   < > 10.8* 11.1* 10.9*  HCT 36.9   < > 39.2 38.0   < > 36.4 37.1 36.4  MCV 103.1*   < > 105.1* 105.8*   < > 104.9* 107.2* 107.4*  PLT 247   < > 299 275   < > 231 221 161   < > = values in this interval not displayed.   Lab Results  Component Value Date   TSH 0.874 03/06/2018   Lab Results  Component Value Date   HGBA1C 11.4  (H) 02/26/2018   Lab Results  Component Value Date   CHOL 94 (L) 04/12/2017   HDL 32 (L) 04/12/2017   LDLCALC 44 04/12/2017   LDLDIRECT 42 04/12/2017   TRIG 90 04/12/2017   CHOLHDL 2.9 04/12/2017    Significant Diagnostic Results in last 30 days:  Dg Foot Complete Right  Result Date: 02/25/2018 CLINICAL DATA:  Toe injury. EXAM: RIGHT FOOT COMPLETE - 3+ VIEW COMPARISON:  Radiographs of May 14, 2012. FINDINGS: Moderately displaced fracture is seen involving the proximal portion of the first proximal phalanx with intra-articular extension with some callus formation  suggesting subacute fracture. Minimally displaced fracture is seen involving the proximal portion of the second distal phalanx. Soft tissue irregularity or wound is seen involving the distal portion of the second toe. Mild posterior calcaneal spurring is noted. IMPRESSION: Probable subacute moderately displaced fracture is seen involving the first proximal phalanx with intra-articular extension. Probable minimally displaced acute fracture is seen involving proximal portion of the second distal phalanx. Overlying soft tissue irregularity or wound is seen involving the distal soft tissues of the second toe. Electronically Signed   By: Marijo Conception, M.D.   On: 02/25/2018 12:17    Assessment/Plan  #1- history of right second and third toe amputation-she is followed by surgery she will need follow-up with Dr. Sharol Given- also will need close follow-up by wound care in the facility at this point per wound care no sign of infection there is some bleeding at times.  Her pain appears to be controlled- she does have orders for Tylenol. She also has an order for cyclobenzaprine 10 mg nightly  2.-  History of pneumonia she is completed antibiotics in the hospital continues on oxygen was thought to have complications with hypercarbic respiratory failure- I Rachel Chaney see recommendation to try to titrate down oxygen to keep her stats from 88 to 92% and will  write an order as such.  She does not complain of shortness of breath currently.  3.  History of cardiac arrest unknown etiology thought possibly hypercarbic related-pulmonary embolism has been ruled out-she is no longer on anticoagulation was treated with heparin in the hospital-.  4.-  History of hypotension she does have an order for Midodrine 10 mg 3 times daily-this appears stable currently continue to monitor.  5.  History of type 2 diabetes she is on Levemir 13 units twice daily at this point will monitor she has limited reading so far since she has just come to the facility but this will have to be watched.  6.-  History of anemia with end-stage renal disease she is on iron and this is followed as well by dialysis.  7.  History of GERD she continues on Protonix.  8.  History of chronic leukocytosis white count appear to be trending down slightly at 25,300 on lab done on March 5- recommendation for hematology oncology consult  I Rachel Chaney note that there are recommendations for cardiology follow-up for possible placement of a cardiac monitor  As well-- will write an order for such.   [id number] note greater than 45 minutes spent assessing patient reviewing her chart and labs and coordinating and formulating a plan of care for numerous diagnoses-of note greater than 50% of time spent coordinating a plan of care with input as noted above

## 2018-03-15 NOTE — Telephone Encounter (Signed)
Reviewed hospital chart. Since patient CBGs ranged from 135-260 on lantus 25U daily, would be safe to increase to 13U BID. She needs hospital follow up appointment to titrate up as needed.

## 2018-03-15 NOTE — Telephone Encounter (Signed)
Spoke with pharmacy and since levemir is being dispensed in a flex pen, patient will need to inject whole units.  Will check with MD.  Rachel Chaney

## 2018-03-15 NOTE — Telephone Encounter (Signed)
Spoke with pharmacist and informed them of dosing increase to 13units BID.  Rachel Chaney,CMA

## 2018-03-15 NOTE — Telephone Encounter (Signed)
Based on hospital discharge summary agree with levemir 12.5 units BID. Please inform pharmacy.

## 2018-03-15 NOTE — Telephone Encounter (Signed)
2nd message from pharmacy.  Danley Danker, RN [facility] RN)

## 2018-03-16 ENCOUNTER — Encounter: Payer: Self-pay | Admitting: Internal Medicine

## 2018-03-19 ENCOUNTER — Encounter: Payer: Self-pay | Admitting: Internal Medicine

## 2018-03-19 ENCOUNTER — Non-Acute Institutional Stay (SKILLED_NURSING_FACILITY): Payer: Medicare Other | Admitting: Internal Medicine

## 2018-03-19 DIAGNOSIS — N186 End stage renal disease: Secondary | ICD-10-CM | POA: Diagnosis not present

## 2018-03-19 DIAGNOSIS — Z992 Dependence on renal dialysis: Secondary | ICD-10-CM

## 2018-03-19 DIAGNOSIS — I469 Cardiac arrest, cause unspecified: Secondary | ICD-10-CM

## 2018-03-19 DIAGNOSIS — E1165 Type 2 diabetes mellitus with hyperglycemia: Secondary | ICD-10-CM

## 2018-03-19 DIAGNOSIS — M869 Osteomyelitis, unspecified: Secondary | ICD-10-CM

## 2018-03-19 NOTE — Assessment & Plan Note (Signed)
Focus on risk factors for recurrence; uncontrolled diabetes, hypertension, and dyslipidemia

## 2018-03-19 NOTE — Assessment & Plan Note (Signed)
Wound care at SNF 

## 2018-03-19 NOTE — Progress Notes (Signed)
NURSING HOME LOCATION:  [facility] ROOM NUMBER: [id number]   CODE STATUS:  Full Code  PCP: Bufford Lope DO   This is a comprehensive admission note to [place] performed on this date less than 30 days from date of admission. Included are preadmission medical/surgical history; reconciled medication list; family history; social history and comprehensive review of systems.  Corrections and additions to the records were documented. Comprehensive physical exam was also performed. Additionally a clinical summary was entered for each active diagnosis pertinent to this admission in the Problem List to enhance continuity of care.  HPI: Patient was hospitalized 2/17- 03/14/2018 for open toe fracture of the second right toe associated with soft tissue infection and osteomyelitis.  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 Sharol Given.  She was noted to have a large purulent abscess extending to the midfoot. The patient experienced cardiac arrest 02/28/2018 with acute hypoxic and hypercarbic respiratory failure and acute hypotension, possibly secondary to PTE.  While intubated the patient had ventilator acquired pneumonia.  This was complicated by acute metabolic encephalopathy with altered mental status.  Past medical and surgical history: Includes end-stage renal disease on hemodialysis, type 2 diabetes, essential hypertension, dyslipidemia, GERD, heart failure with preserved ejection fraction, secondary hyperparathyroidism, hypoalbuminemia, and anemia of chronic kidney disease. She had a fistula placement for hemodialysis.  Social history: Never drank or smoke.  Family history: Heart disease in both parents.   Review of systems: Surprisingly she denies any pain related to the foot surgery or the cardiac resusitation.  She has no active cardio, pulmonary, GI, or GU symptoms.  She gave the date as March 12, 2018.  She cannot name the president.  She did know this  was [place].  Extensive review of systems resulted in monosyllabic "no" replies. She states that she was checking her glucoses morning and noon at home and they had ranged from 120 up to 140.  She states that her ophthalmologic exam is up-to-date having been performed 3 months ago.  She states that she missed hemodialysis yesterday because she was not transported.  Staff states that she and her husband refused to have her transported. PT/OT report dark stools.  There is also a perineal irritation reported.  Constitutional: No fever, significant weight change, fatigue  Eyes: No redness, discharge, pain, vision change ENT/mouth: No nasal congestion, purulent discharge, earache, change in hearing, sore throat  Cardiovascular: No chest pain, palpitations, paroxysmal nocturnal dyspnea, claudication, edema  Respiratory: No cough, sputum production, hemoptysis, DOE, significant snoring, apnea Gastrointestinal: No heartburn, dysphagia, abdominal pain, nausea /vomiting, rectal bleeding, melena, change in bowels Genitourinary: No dysuria, hematuria, pyuria, incontinence, nocturia Musculoskeletal: No joint stiffness, joint swelling, weakness, pain Dermatologic: No rash, pruritus, change in appearance of skin Neurologic: No dizziness, headache, syncope, seizures, numbness, tingling Psychiatric: No significant anxiety, depression, insomnia, anorexia Endocrine: No change in hair/skin/nails, excessive thirst, excessive hunger, excessive urination  Hematologic/lymphatic: No significant bruising, lymphadenopathy, abnormal bleeding Allergy/immunology: No itchy/watery eyes, significant sneezing, urticaria, angioedema  Physical exam:  Pertinent or positive findings: Globes are prominent.  There is thinning of the right eyebrow laterally.  She exhibited no direct eye contact during the interview.  There is accentuation of the first heart sound. Facies were expressionless. Abdomen is protuberant.  Surprisingly  pedal pulses are palpable.  The dorsalis pedis pulses are stronger than the posterior tibial pulses.  The right foot is wrapped.  The right great toe which is visualized is  deviated laterally.  There is thickening and elongation of the toenail.  There is a wrap over the left biceps at the fistula site.  General appearance: Adequately nourished; no acute distress, increased work of breathing is present.   Lymphatic: No lymphadenopathy about the head, neck, axilla. Eyes: No conjunctival inflammation or lid edema is present. There is no scleral icterus. Ears:  External ear exam shows no significant lesions or deformities.   Nose:  External nasal examination shows no deformity or inflammation. Nasal mucosa are pink and moist without lesions, exudates Oral exam: Lips and gums are healthy appearing.There is no oropharyngeal erythema or exudate. Neck:  No thyromegaly, masses, tenderness noted.    Heart:  Normal rate and regular rhythm. S1 and S2 normal without gallop, murmur, click, rub.  Lungs: Chest clear to auscultation without wheezes, rhonchi, rales, rubs. Abdomen: Bowel sounds are normal.  Abdomen is soft and nontender with no organomegaly, hernias, masses. GU: Deferred  Extremities:  No cyanosis, clubbing, edema. Neurologic exam:  Strength equal  in upper & lower extremities. Balance, Rhomberg, finger to nose testing could not be completed due to clinical state Skin: Warm & dry w/o tenting. No significant  rash.  See clinical summary under each active problem in the Problem List with associated updated therapeutic plan

## 2018-03-19 NOTE — Assessment & Plan Note (Addendum)
02/26/2018 hemoglobin A1c 11.4% indicating grossly uncontrolled diabetes, complicated by ESRD

## 2018-03-19 NOTE — Patient Instructions (Signed)
See assessment and plan under each diagnosis in the problem list and acutely for this visit 

## 2018-03-20 NOTE — Assessment & Plan Note (Signed)
Encouraged to go to HD 3/11

## 2018-03-25 ENCOUNTER — Encounter: Payer: Self-pay | Admitting: Internal Medicine

## 2018-03-25 ENCOUNTER — Non-Acute Institutional Stay (SKILLED_NURSING_FACILITY): Payer: Medicare Other | Admitting: Internal Medicine

## 2018-03-25 DIAGNOSIS — Z992 Dependence on renal dialysis: Secondary | ICD-10-CM

## 2018-03-25 DIAGNOSIS — E1140 Type 2 diabetes mellitus with diabetic neuropathy, unspecified: Secondary | ICD-10-CM | POA: Diagnosis not present

## 2018-03-25 DIAGNOSIS — Z8701 Personal history of pneumonia (recurrent): Secondary | ICD-10-CM | POA: Diagnosis not present

## 2018-03-25 DIAGNOSIS — M869 Osteomyelitis, unspecified: Secondary | ICD-10-CM | POA: Diagnosis not present

## 2018-03-25 DIAGNOSIS — J302 Other seasonal allergic rhinitis: Secondary | ICD-10-CM

## 2018-03-25 DIAGNOSIS — I959 Hypotension, unspecified: Secondary | ICD-10-CM | POA: Diagnosis not present

## 2018-03-25 DIAGNOSIS — K219 Gastro-esophageal reflux disease without esophagitis: Secondary | ICD-10-CM

## 2018-03-25 DIAGNOSIS — N186 End stage renal disease: Secondary | ICD-10-CM

## 2018-03-25 DIAGNOSIS — M549 Dorsalgia, unspecified: Secondary | ICD-10-CM

## 2018-03-25 NOTE — Progress Notes (Signed)
Location:    [facility] Room Number: 517/O Place of Service:  SNF [phone])  Provider: Granville Lewis PA-C  PCP: Bufford Lope, DO Patient Care Team: Bufford Lope, DO as PCP - General Stanford Breed Denice Bors, MD as PCP - Cardiology (Cardiology)  Extended Emergency Contact Information Primary Emergency Contact: Tomaso,Albert Address: [address]          [place], [place] [address] Johnnette Litter of [place] Phone: [phone] Mobile Phone: [phone] Relation: Spouse  Code Status: Full Code Goals of care:  Advanced Directive information Advanced Directives 03/25/2018  Does Patient Have a Medical Advance Directive? Yes  Type of Advance Directive (No Data)  Does patient want to make changes to medical advance directive? No - Patient declined  Would patient like information on creating a medical advance directive? No - Patient declined  Pre-existing out of facility DNR order (yellow form or pink MOST form) -     No Known Allergies  Chief Complaint  Patient presents with   Discharge Note    Discharge Visit    HPI:  58 y.o. female today for discharge from facility tomorrow.  Patient will be going home she does have a very supportive husband that she will need PT OT as well as nursing support for her multiple medical issues.  She was here for short-term rehab after complicated hospitalization.  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  She was originally admitted to the hospital on February 25, 2018 for right second toe fracture and soft tissue infection that was concerning for osteomyelitis.  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  Cultures were positive for staph aureus she was treated with IV  antibiotics.  February 20 she had cardiac arrest of unknown etiology and required intubation and transfer to the ICU.  She was thought to possibly have a pulmonary embolism secondary to acute decompensation with recent surgery.  She was noted to have a thrombus in the right upper extremity cephalad at veins.  At that point a CTA was not performed because of her end-stage renal disease.  She was started on a heparin drip and extubated.  Next day she was found to be more lethargic with new hypercarbic respiratory failure and was placed on BiPAP.  By February 23 she was doing better.  She continued to have some altered mental status at times and hypotension with new oxygen requirement.  She was started on Midodrine  and received cautious dialysis by nephrology  Eventually a CTA was performed after consulting nephrology was negative for a PE but demonstrated bilateral consolidation suspicious for pneumonia.  Her anticoagulation was stopped and she completed IV cefepime and on March 3.  Spittle discharge she had complete her antibiotics and showed significant improvement.  Her stay here has been quite stable she continues to be on oxygen but attempts to wean her down- this will continue to have to be encouraged  Current she has no complaints vital signs appear to be stable-  Her other diagnoses include diabetes she is on Levemir 13 units twice daily recent blood sugars appear to run from the mid 100s to lower 200s generally-since she is about to go home would not be real aggressive changing her medications but will  defer to primary care provider  She does have a history of end-stage renal disease continues on hemodialysis 3 days a week- she does receive Midodrine  3 times a day for hypotension concerns blood pressure today 137/52  She also has a history of chronic leukocytosis is actually appear to be somewhat improved on lab done on March 9 it was down to 17,600- this will warrant  hematology oncology follow-up as an outpatient.    Past Medical History:  Diagnosis Date   CHF (congestive heart failure) ([facility])    Chronic kidney disease    Chronic kidney disease due to diabetes mellitus ([facility]) 10/13/2013   Diabetes mellitus    Hyperlipidemia    Hypertension     Past Surgical History:  Procedure Laterality Date   AMPUTATION Right 02/27/2018   Procedure: RIGHT FOOT 2ND AND 3RD RAY AMPUTATION;  Surgeon: [name], MD;  Location: [facility];  Service: Orthopedics;  Laterality: Right;   AV FISTULA PLACEMENT Left 04/15/2015   Procedure: ARTERIOVENOUS (AV) FISTULA CREATION;  Surgeon: Mal Misty, MD;  Location: [facility];  Service: Vascular;  Laterality: Left;   EYE SURGERY Right    retinal detachment   FISTULA SUPERFICIALIZATION Left 06/03/2015   Procedure: LEFT UPPER ARM BRACHIOCEPHALIC FISTULA SUPERFICIALIZATION;  Surgeon: Mal Misty, MD;  Location: [facility];  Service: Vascular;  Laterality: Left;  from [place] to [facility]      reports that she has never smoked. She has never used smokeless tobacco. She reports that she does not drink alcohol or use drugs. Social History   Socioeconomic History   Marital status: Married    Spouse name: Not on file   Number of children: Not on file   Years of education: Not on file   Highest education level: Not on file  Occupational History   Not on file  Social Needs   Financial resource strain: Not on file   Food insecurity:    Worry: Not on file    Inability: Not on file   Transportation needs:    Medical: Not on file    Non-medical: Not on file  Tobacco Use   Smoking status: Never Smoker   Smokeless tobacco: Never Used  Substance and Sexual Activity   Alcohol use: No    Alcohol/week: 0.0 standard drinks   Drug use: No   Sexual activity: Never  Lifestyle   Physical activity:    Days per week: Not on file    Minutes per session: Not on file   Stress: Not on file  Relationships   Social  connections:    Talks on phone: Not on file    Gets together: Not on file    Attends religious service: Not on file    Active member of club or organization: Not on file    Attends meetings of clubs or organizations: Not on file    Relationship status: Not on file   Intimate partner violence:    Fear of current or ex partner: Not on file    Emotionally abused: Not on file    Physically abused: Not on file    Forced sexual activity: Not on file  Other Topics Concern   Not on file  Social History Narrative   Not on file   Functional Status Survey:    No Known Allergies  Pertinent  Health Maintenance Due  Topic Date Due   MAMMOGRAM  04/08/2012   INFLUENZA VACCINE  04/15/2018 (Originally 08/09/2017)   OPHTHALMOLOGY EXAM  04/15/2018 (Originally 04/09/2011)   COLONOSCOPY  04/15/2018 (Originally 11/16/2008)   FOOT EXAM  08/24/2018   HEMOGLOBIN A1C  08/27/2018   PAP SMEAR-Modifier  12/07/2018    Medications: Allergies as of 03/25/2018   No Known Allergies     Medication List       Accurate as of March 25, 2018  4:29 PM. Always use your most recent med list.        acetaminophen 500 MG tablet Commonly known as:  TYLENOL Take 1,000 mg by mouth every 6 (six) hours as needed for pain.   atorvastatin 80 MG tablet Commonly known as:  LIPITOR Take 1 tablet (80 mg total) by mouth daily.   Auryxia 1 GM 210 MG(Fe) tablet Generic drug:  ferric citrate Give two (420 mg) by mouth three times a day wih meals for ESRD   bisacodyl 10 MG suppository Commonly known as:  DULCOLAX Constipation : give 10 mg Bisacodyl suppositiory rectally X 1 dose in 24 hours as needed (Do not use constipation standing orders for residents with renal failure/CFR less than 30. Contact MD for orders)   cyclobenzaprine 10 MG tablet Commonly known as:  FLEXERIL Take 1 tablet (10 mg total) by mouth at bedtime.   diclofenac sodium 1 % Gel Commonly known as:  VOLTAREN Apply 4 g topically 4 (four)  times daily.   feeding supplement (PRO-STAT SUGAR FREE 64) Liqd Take 30 mLs by mouth 2 (two) times daily.   fluticasone 50 MCG/ACT nasal spray Commonly known as:  FLONASE Place 1 spray into both nostrils daily. 1 spray in each nostril every day   glucose blood test strip Commonly known as:  ONE TOUCH ULTRA TEST 1 each by Other route 3 (three) times daily. ICD-10 code: E11.40.   hydrOXYzine 50 MG tablet Commonly known as:  ATARAX/VISTARIL Take 50 mg by mouth 2 (two) times daily as needed for itching.   Insulin Pen Needle 29G X 12MM Misc Inject 1 pen into the skin 2 (two) times daily. Check blood sugar daily.   Insulin Pen Needle 31G X 5 MM Misc Commonly known as:  B-D UF III MINI PEN NEEDLES USE AS DIRECTED FOR INSULIN INJECTION TWICE DAILY   Levemir FlexTouch 100 UNIT/ML Pen Generic drug:  Insulin Detemir Inject 13Units SQ bid for DM   midodrine 10 MG tablet Commonly known as:  PROAMATINE Take 1 tablet (10 mg total) by mouth 3 (three) times daily with meals.   MULTIVITAMINS PO Give one by mouth at bedtime for supplement   ONE TOUCH ULTRA 2 w/Device Kit 1 kit by Does not apply route 3 (three) times daily. ICD-10 code: E11.40   glucose monitoring kit monitoring kit 1 each by Does not apply route as needed for other.   OneTouch Delica Lancets 62I Misc 1 Device by Does not apply route as needed (to check blood glucose).   pantoprazole 40 MG tablet Commonly known as:  PROTONIX Take 1 tablet (40 mg total) by mouth daily.   RA SALINE ENEMA RE Constipation  If not relieved by Biscodyl suppository, give disposable Saline Enema rectally X 1 dose/24 hrs as needed (Do not use constipation standing orders for residents with renal failure/CFR less than 30. Contact MD for orders)   UltiCare Insulin Syringe 30G X 5/16" 1 ML Misc Generic drug:  Insulin Syringe-Needle U-100 USE AS DIRECTED       Review of Systems  In general she is not complaining of fevers chills.  Skin is  not complain  of rashes or itching at this time.  She does have surgical wound right foot which is followed by wound care nurse and thought to be stable she will need expedient orthopedic follow-up which is been arranged for later this week  Head ears eyes nose mouth and throat no visual changes or sore throat  Respiratory is not complaining of shortness of breath or cough.  Cardiac is not complaining of chest pain or palpitations.  GI does not complain of nausea vomiting diarrhea or constipation.  GU does have end-stage renal disease does not complain of dysuria  Musculoskeletal is not really complaining of joint pain or foot pain at this time.  Neurologic does not complain of dizziness headache or numbness does have weakness.  Psych does not complain of being depressed or anxious   Vitals:   03/25/18 1614  BP: (!) 137/52  Pulse: 96  Resp: 14  Temp: 98.8 F (37.1 C)  TempSrc: Oral  SpO2: 98%    Physical Exam   In general pleasant middle-age female in no distress lying comfortably in bed.  Skin is warm and dry shunt site left upper arm is currently covered  Oropharynx is clear mucous membranes moist.  Chest is clear to auscultation with shallow air entry there is no labored breathing.  Heart is regular rate and rhythm without murmur gallop or rub she has scant lower extremity edema.  Abdomen is obese soft nontender with positive bowel sounds.  Musculoskeletal is able to move all extremities x4 with significant lower extremity weakness-- she does have bandaging wrapping of her right foot status post toe amputations- later I was able to assess this with nursing sutures are still in place area has somewhat of a dusky appearance but I do not note any drainage or odor  Per wound care nurse this is baseline with what it has appeared like during her stay here  Neurologic is grossly intact her speech is clear  Psych she appears alert and oriented does speak somewhat slowly but  this is not new does not speak a whole lot      Labs reviewed: Basic Metabolic Panel: Recent Labs    03/04/18 0438 03/05/18 0500  03/07/18 0543  03/10/18 0751 03/11/18 0504 03/12/18 0547 03/13/18 0532 03/14/18 0314  NA 135 135   < > 133*   < > 137 135 138 137 135  K 3.7 3.7   < > 4.0   < > 3.7 3.5 3.1* 3.1* 4.0  CL 95* 98   < > 96*   < > 98 100 101 99 98  CO2 23 22   < > 21*   < > 18* 20* 23 24 25   GLUCOSE 214* 251*   < > 252*   < > 170* 197* 135* 188* 217*  BUN 27* 27*   < > 59*   < > 32* 52* 21* 39* 26*  CREATININE 4.84* 3.99*   < > 7.64*   < > 5.30* 7.05* 4.29* 6.46* 4.58*  CALCIUM 8.3* 8.7*   < > 9.1   < > 9.1 8.7* 8.5* 9.0 8.9  MG 2.1 2.1  --  2.2  --   --   --   --   --   --   PHOS 5.7* 4.3   < > 4.9*   < > 4.7* 4.2  --  3.4  --    < > = values in this interval not displayed.   Liver Function Tests: Recent Labs  02/25/18 1105  02/28/18 2232  03/10/18 0751 03/11/18 0504 03/13/18 0532  AST 15  --  145*  --   --   --   --   ALT 18  --  96*  --   --   --   --   ALKPHOS 85  --  173*  --   --   --   --   BILITOT 0.6  --  1.4*  --   --   --   --   PROT 6.9  --  6.6  --   --   --   --   ALBUMIN 2.6*   < > 2.1*   < > 2.4* 2.2* 2.3*   < > = values in this interval not displayed.   No results for input(s): LIPASE, AMYLASE in the last 8760 hours. No results for input(s): AMMONIA in the last 8760 hours. CBC: Recent Labs    02/25/18 1105  02/27/18 0801 02/28/18 0524  03/12/18 0547 03/13/18 0532 03/14/18 0314  WBC 27.0*   < > 32.1* 30.7*   < > 28.8* 27.1* 25.3*  NEUTROABS 22.7*  --  28.9* 26.4*  --   --   --   --   HGB 11.1*   < > 11.9* 11.2*   < > 10.8* 11.1* 10.9*  HCT 36.9   < > 39.2 38.0   < > 36.4 37.1 36.4  MCV 103.1*   < > 105.1* 105.8*   < > 104.9* 107.2* 107.4*  PLT 247   < > 299 275   < > 231 221 161   < > = values in this interval not displayed.   Cardiac Enzymes: Recent Labs    03/01/18 1515 03/01/18 1850 03/06/18 0758  TROPONINI 0.75* 0.63*  0.13*   BNP: Invalid input(s): POCBNP CBG: Recent Labs    03/14/18 0728 03/14/18 1101 03/14/18 1635  GLUCAP 188* 269* 291*    Procedures and Imaging Studies During Stay: Dg Chest 2 View  Result Date: 03/05/2018 CLINICAL DATA:  Shortness of breath.  Congestion. EXAM: CHEST - 2 VIEW COMPARISON:  03/03/2018. FINDINGS: Cardiomegaly with bilateral pulmonary interstitial prominence. Interstitial prominence has progressed from prior exam. Findings suggest CHF. Pneumonitis could also present this fashion. No pleural effusion or pneumothorax. IMPRESSION: Cardiomegaly with scratch progressive bilateral pulmonary interstitial prominence suggesting progressive CHF. Pneumonitis can not be excluded. Electronically Signed   By: Marcello Moores  Register   On: 03/05/2018 12:02   Ct Head Wo Contrast  Result Date: 03/06/2018 CLINICAL DATA:  Altered mental status EXAM: CT HEAD WITHOUT CONTRAST TECHNIQUE: Contiguous axial images were obtained from the base of the skull through the vertex without intravenous contrast. COMPARISON:  None. FINDINGS: Brain: There is no mass, hemorrhage or extra-axial collection. The size and configuration of the ventricles and extra-axial CSF spaces are normal. There is hypoattenuation of the white matter, most commonly indicating chronic small vessel disease. Hyperdensity along both frontal convexities is favored to be streak artifact from calvarium. Vascular: No abnormal hyperdensity of the major intracranial arteries or dural venous sinuses. No intracranial atherosclerosis. Skull: The visualized skull base, calvarium and extracranial soft tissues are normal. Sinuses/Orbits: Right maxillary retention cyst. The orbits are normal. IMPRESSION: 1. No acute intracranial abnormality. 2. Chronic small vessel disease. Electronically Signed   By: Ulyses Jarred M.D.   On: 03/06/2018 20:35   Ct Angio Chest Pe W Or Wo Contrast  Result Date: 03/07/2018 CLINICAL DATA:  58 y/o  F; evaluate for pulmonary  embolus. EXAM: CT ANGIOGRAPHY CHEST WITH CONTRAST TECHNIQUE: Multidetector CT imaging of the chest was performed using the standard protocol during bolus administration of intravenous contrast. Multiplanar CT image reconstructions and MIPs were obtained to evaluate the vascular anatomy. CONTRAST:  21m ISOVUE-370 IOPAMIDOL (ISOVUE-370) INJECTION 76% COMPARISON:  03/07/2018 chest radiograph FINDINGS: Cardiovascular: Satisfactory opacification of the pulmonary arteries to the segmental level. No evidence of pulmonary embolism. Moderate cardiomegaly. No pericardial effusion. Aortic calcific atherosclerosis and coronary artery calcifications. Mediastinum/Nodes: 17 mm calcified nodule within the right lobe of the thyroid gland. No mediastinal or axillary lymphadenopathy. Normal thoracic esophagus. Lungs/Pleura: Bilateral lower lobe consolidation. Trace pleural effusions. No pneumothorax. Upper Abdomen: No acute abnormality. Musculoskeletal: Left ribs 1-6 acute minimally displaced anterior fractures. Right rib 1 posterior and rib 2-6 anterior minimally displaced rib fractures. Review of the MIP images confirms the above findings. IMPRESSION: 1. No evidence of pulmonary embolus. 2. Bilateral lower lobe consolidation, likely pneumonia. 3. Left ribs 1-6 acute minimally displaced anterior fractures. Right rib 1 posterior and rib 2-6 anterior minimally displaced acute rib fractures. 4. Moderate cardiomegaly.  No pericardial effusion. Electronically Signed   By: LKristine GarbeM.D.   On: 03/07/2018 16:16   Dg Chest Port 1 View  Result Date: 03/10/2018 CLINICAL DATA:  Back pain and hypotension. EXAM: PORTABLE CHEST 1 VIEW COMPARISON:  03/07/2018 FINDINGS: There is mild cardiac enlargement. Decreased lung volumes. Bilateral lower lobe atelectasis and airspace consolidation is identified. Not significantly improved from previous exam. IMPRESSION: 1. Bilateral lower lobe atelectasis and airspace consolidation. Aeration  to lungs not significantly improved from previous exam. Electronically Signed   By: TKerby MoorsM.D.   On: 03/10/2018 13:55   Dg Chest Port 1 View  Result Date: 03/07/2018 CLINICAL DATA:  Respiratory distress EXAM: PORTABLE CHEST 1 VIEW COMPARISON:  03/05/2018 FINDINGS: Shallow inspiration. Cardiac enlargement. No vascular congestion. Small bilateral pleural effusions with basilar atelectasis or infiltration. No pneumothorax. Tortuous aorta. IMPRESSION: Cardiac enlargement. Small bilateral pleural effusions with basilar atelectasis or infiltration. Electronically Signed   By: WLucienne CapersM.D.   On: 03/07/2018 02:41   Dg Chest Port 1 View  Result Date: 03/03/2018 CLINICAL DATA:  Acute respiratory failure. History CHF. Diabetes. Hypertension. EXAM: PORTABLE CHEST 1 VIEW COMPARISON:  1 day prior FINDINGS: Midline trachea. Cardiomegaly accentuated by AP portable technique. No pleural effusion or pneumothorax. Persistently low lung volumes. No change in interstitial and basilar predominant airspace disease. IMPRESSION: 1. No significant change since the prior exam. 2. Cardiomegaly with low lung volumes and interstitial and airspace disease. Pulmonary edema and/or infection. Electronically Signed   By: KAbigail MiyamotoM.D.   On: 03/03/2018 08:23   Dg Chest Port 1 View  Result Date: 03/02/2018 CLINICAL DATA:  Follow-up infiltrates EXAM: PORTABLE CHEST 1 VIEW COMPARISON:  03/01/2018 FINDINGS: Endotracheal tube and NG tubes removed Bilateral airspace disease with mild interval improvement. No effusion. IMPRESSION: Improvement in bilateral airspace disease following extubation. Electronically Signed   By: CFranchot GalloM.D.   On: 03/02/2018 10:36   Portable Chest Xray  Result Date: 03/01/2018 CLINICAL DATA:  Intubation.  Cardiac arrest. EXAM: PORTABLE CHEST 1 VIEW COMPARISON:  03/01/2018. FINDINGS: Endotracheal tube and NG tube in stable position. Cardiomegaly. Diffuse bilateral pulmonary  infiltrates/edema. No pleural effusion or pneumothorax. IMPRESSION: 1.  Lines and tubes in stable position. 2. Cardiomegaly. Diffuse bilateral pulmonary infiltrates/edema. Small bilateral pleural effusions. 3.  Persistent prominent bibasilar atelectasis. Electronically Signed   By: TMarcello Moores Register   On: 03/01/2018 06:44  Dg Chest Port 1 View  Result Date: 03/01/2018 CLINICAL DATA:  58 year old female with end-stage renal disease, admitted for foot amputation. Found unresponsive on dialysis yesterday status post CPR. EXAM: PORTABLE CHEST 1 VIEW COMPARISON:  02/28/2018 and earlier. FINDINGS: Portable AP semi upright view at 0022 hours. Intubated with stable endotracheal tube tip at the level the clavicles. Enteric tube courses to the abdomen, tip not included. Confluent bibasilar opacity which more resembles airspace disease than pleural fluid. Streaky bilateral perihilar opacity resembling atelectasis. No overt edema. The upper lungs are clear. No pneumothorax. Stable cardiac size and mediastinal contours. IMPRESSION: 1.  Stable lines and tubes. 2. Bilateral lower lobe collapse or consolidation suspected with superimposed perihilar atelectasis. Electronically Signed   By: Genevie Ann M.D.   On: 03/01/2018 00:40   Portable Chest X-ray  Result Date: 02/28/2018 CLINICAL DATA:  Endotracheal tube adjustment. EXAM: PORTABLE CHEST 1 VIEW COMPARISON:  02/28/2018 at 1503 hours FINDINGS: The endotracheal tube has been retracted and now terminates approximately 7 mm above the carina. There is improved aeration of the left lung with residual patchy opacities in the mid upper lung and denser consolidation or atelectasis in the left lung base. Patchy opacities are also present in the right lung base. No pneumothorax is identified. Enteric tube courses into the abdomen with tip not imaged. IMPRESSION: 1. Interval retraction of endotracheal tube, which now terminates 7 mm above the carina. 2. Improved aeration of the left  lung. Electronically Signed   By: Logan Bores M.D.   On: 02/28/2018 16:15   Dg Chest Port 1 View  Result Date: 02/28/2018 CLINICAL DATA:  Unresponsive. EXAM: PORTABLE CHEST 1 VIEW FINDINGS: Endotracheal tube noted with tip in the mainstem bronchus. Proximal repositioning of approximately 7 cm should be considered. NG tube noted tip below left hemidiaphragm. Opacification left hemithorax noted this is most likely related atelectasis and or consolidation. Atelectatic changes and/or infiltrates noted right lung. Heart size is difficult to assess. Degenerative changes and scoliosis thoracic spine. IMPRESSION: 1. Endotracheal tube tip noted in the right mainstem bronchus. Proximal repositioning of approximately 7 cm should be considered. NG tube noted tip below left hemidiaphragm. 2. Complete opacification of the left hemithorax most likely related to atelectasis and or consolidation. Atelectatic changes and/or infiltrates are also noted in the right lung. Critical Value/emergent results were called by telephone at the time of interpretation on 02/28/2018 at 3:24 pm to nurse Tammy , who verbally acknowledged these results. Electronically Signed   By: Marcello Moores  Register   On: 02/28/2018 15:30   Dg Foot Complete Right  Result Date: 02/25/2018 CLINICAL DATA:  Toe injury. EXAM: RIGHT FOOT COMPLETE - 3+ VIEW COMPARISON:  Radiographs of May 14, 2012. FINDINGS: Moderately displaced fracture is seen involving the proximal portion of the first proximal phalanx with intra-articular extension with some callus formation suggesting subacute fracture. Minimally displaced fracture is seen involving the proximal portion of the second distal phalanx. Soft tissue irregularity or wound is seen involving the distal portion of the second toe. Mild posterior calcaneal spurring is noted. IMPRESSION: Probable subacute moderately displaced fracture is seen involving the first proximal phalanx with intra-articular extension. Probable  minimally displaced acute fracture is seen involving proximal portion of the second distal phalanx. Overlying soft tissue irregularity or wound is seen involving the distal soft tissues of the second toe. Electronically Signed   By: Marijo Conception, M.D.   On: 02/25/2018 12:17   Vas [place] Burnard Bunting With/wo Tbi  Result Date: 02/27/2018 LOWER  EXTREMITY DOPPLER STUDY Indications: Diabetes.  Limitations: Patient movement, open toe wound Performing Technologist: Carlos Levering RVT  Examination Guidelines: A complete evaluation includes at minimum, Doppler waveform signals and systolic blood pressure reading at the level of bilateral brachial, anterior tibial, and posterior tibial arteries, when vessel segments are accessible. Bilateral testing is considered an integral part of a complete examination. Photoelectric Plethysmograph (PPG) waveforms and toe systolic pressure readings are included as required and additional duplex testing as needed. Limited examinations for reoccurring indications may be performed as noted.  ABI Findings: +--------+------------------+-----+---------+--------+  Right    Rt Pressure (mmHg) Index Waveform  Comment   +--------+------------------+-----+---------+--------+  Brachial 89                                           +--------+------------------+-----+---------+--------+  PTA      96                 1.08  triphasic           +--------+------------------+-----+---------+--------+  DP       147                1.65  triphasic           +--------+------------------+-----+---------+--------+ +--------+------------------+-----+---------+---------+  Left     Lt Pressure (mmHg) Index Waveform  Comment    +--------+------------------+-----+---------+---------+  Brachial                                    HD Access  +--------+------------------+-----+---------+---------+  PTA      161                1.81  biphasic             +--------+------------------+-----+---------+---------+  DP       254                 2.85  triphasic            +--------+------------------+-----+---------+---------+ +-------+-----------+-----------+------------+------------+  ABI/TBI Today's ABI Today's TBI Previous ABI Previous TBI  +-------+-----------+-----------+------------+------------+  Right   1.65                                               +-------+-----------+-----------+------------+------------+  Left    2.85                                               +-------+-----------+-----------+------------+------------+  Summary: Right: Resting right ankle-brachial index indicates noncompressible right lower extremity arteries. Unable to obtain TBI due to patient movement. Left: Resting left ankle-brachial index indicates noncompressible left lower extremity arteries. Unable to obtain TBI due to patient movement.  *See table(s) above for measurements and observations.  Electronically signed by Servando Snare MD on 02/27/2018 at 2:09:40 PM.   Final    Vas [place] Lower Extremity Venous (dvt)  Result Date: 03/01/2018  Lower Venous Study Indications: Cardiopulmonary arrest.  Limitations: Body habitus, poor ultrasound/tissue interface, bandages and line. Performing Technologist: Oliver Hum RVT  Examination Guidelines: A complete evaluation includes B-mode imaging, spectral Doppler, color Doppler, and  power Doppler as needed of all accessible portions of each vessel. Bilateral testing is considered an integral part of a complete examination. Limited examinations for reoccurring indications may be performed as noted.  Right Venous Findings: +---------+---------------+---------+-----------+----------+--------------+            Compressibility Phasicity Spontaneity Properties Summary         +---------+---------------+---------+-----------+----------+--------------+  CFV                                                        Not visualized  +---------+---------------+---------+-----------+----------+--------------+  SFJ                                                         Not visualized  +---------+---------------+---------+-----------+----------+--------------+  FV Prox   Full                                                             +---------+---------------+---------+-----------+----------+--------------+  FV Mid    Full                                                             +---------+---------------+---------+-----------+----------+--------------+  FV Distal Full                                                             +---------+---------------+---------+-----------+----------+--------------+  PFV                                                        Not visualized  +---------+---------------+---------+-----------+----------+--------------+  POP       Full            Yes       Yes                                    +---------+---------------+---------+-----------+----------+--------------+  PTV       Full                                                             +---------+---------------+---------+-----------+----------+--------------+  PERO  Not visualized  +---------+---------------+---------+-----------+----------+--------------+  Left Venous Findings: +---------+---------------+---------+-----------+----------+-------+            Compressibility Phasicity Spontaneity Properties Summary  +---------+---------------+---------+-----------+----------+-------+  CFV       Full            Yes       Yes                             +---------+---------------+---------+-----------+----------+-------+  SFJ       Full                                                      +---------+---------------+---------+-----------+----------+-------+  FV Prox   Full                                                      +---------+---------------+---------+-----------+----------+-------+  FV Mid    Full                                                       +---------+---------------+---------+-----------+----------+-------+  FV Distal Full                                                      +---------+---------------+---------+-----------+----------+-------+  PFV       Full                                                      +---------+---------------+---------+-----------+----------+-------+  POP       Full            Yes       Yes                             +---------+---------------+---------+-----------+----------+-------+  PTV       Full                                                      +---------+---------------+---------+-----------+----------+-------+  PERO      Full                                                      +---------+---------------+---------+-----------+----------+-------+    Summary: Right: There is no evidence of deep vein thrombosis in the lower extremity. However, portions of this examination were limited- see technologist comments above.  No cystic structure found in the popliteal fossa. Left: There is no evidence of deep vein thrombosis in the lower extremity. However, portions of this examination were limited- see technologist comments above. No cystic structure found in the popliteal fossa.  *See table(s) above for measurements and observations. Electronically signed by Deitra Mayo MD on 03/01/2018 at 2:39:35 PM.    Final    Vas [place] Upper Extremity Venous Duplex  Result Date: 03/01/2018 UPPER VENOUS STUDY  Indications: pulmonary embolism Limitations: Body habitus and Immobility, tightening bilateral arms, unable to move due to pain. Comparison Study: Negative BLEV study on 02/28/2018. Performing Technologist: Rudell Cobb, H  Examination Guidelines: A complete evaluation includes B-mode imaging, spectral Doppler, color Doppler, and power Doppler as needed of all accessible portions of each vessel. Bilateral testing is considered an integral part of a complete examination. Limited examinations for reoccurring indications may  be performed as noted.  Right Findings: +----------+------------+----------+---------+-----------+-------+  RIGHT      Compressible Properties Phasicity Spontaneous Summary  +----------+------------+----------+---------+-----------+-------+  IJV            Full                   Yes        Yes              +----------+------------+----------+---------+-----------+-------+  Subclavian     Full                   Yes        Yes              +----------+------------+----------+---------+-----------+-------+  Axillary       Full                   Yes        Yes              +----------+------------+----------+---------+-----------+-------+  Brachial       Full                   Yes        Yes              +----------+------------+----------+---------+-----------+-------+  Radial         Full                                               +----------+------------+----------+---------+-----------+-------+  Ulnar          Full                                               +----------+------------+----------+---------+-----------+-------+  Cephalic     Partial                                      Acute   +----------+------------+----------+---------+-----------+-------+  Basilic        Full                   Yes        Yes              +----------+------------+----------+---------+-----------+-------+ Right cephalic vein: partial thrombosed on the  distal segment to forearm.  Left Findings: +----------+------------+----------+---------+-----------+-------+  LEFT       Compressible Properties Phasicity Spontaneous Summary  +----------+------------+----------+---------+-----------+-------+  IJV            Full                   Yes        Yes              +----------+------------+----------+---------+-----------+-------+  Subclavian     Full                   Yes        Yes              +----------+------------+----------+---------+-----------+-------+  Axillary       Full                   Yes        Yes               +----------+------------+----------+---------+-----------+-------+  Brachial       Full                   Yes        Yes              +----------+------------+----------+---------+-----------+-------+  Radial         Full                                               +----------+------------+----------+---------+-----------+-------+  Ulnar          Full                                               +----------+------------+----------+---------+-----------+-------+  Cephalic       Full                                               +----------+------------+----------+---------+-----------+-------+  Basilic        Full                   Yes        Yes              +----------+------------+----------+---------+-----------+-------+ Dialysis access is patent.  Summary:  Right: No evidence of deep vein thrombosis in the upper extremity. No evidence of thrombosis in the subclavian. Findings consistent with acute superficial vein thrombosis involving the right cephalic vein.  Left: No evidence of deep vein thrombosis in the upper extremity. No evidence of superficial vein thrombosis in the upper extremity. No evidence of thrombosis in the subclavian.  *See table(s) above for measurements and observations.  Diagnosing physician: Deitra Mayo MD Electronically signed by Deitra Mayo MD on 03/01/2018 at 2:37:20 PM.    Final     Assessment/Plan:    #1 history of right second and third toe amputation with history of osteomyelitis-she is followed by Dr. Sharol Given of surgery and does have follow-up later this week.  I she has been followed by the wound care nurse here in the facility  I regards to  pain this appears to be controlled on Tylenol she also has an order for cyclobenzaprine 10 mg nightly.  She also has an order for Voltaren gel for pain  2.  History of pneumonia with respiratory failure-she has completed antibiotics continues to be on oxygen but there have been attempts to try to wean this down- this  will need to be continued as an outpatient again the goal of her O2 saturation actually is 88 to 92% and I did discuss this with nursing today  #3 history of cardiac arrest unknown etiology thought possibly hypercarbic related.  Pulmonary embolism was ruled out no longer on anticoagulation.  4.  History of hypotension she continues on Midodrine 10 mg 3 times a day.  5.  History of end-stage renal disease again continues on chronic hemodialysis.  6.--History of type 2 diabetes she is on Levemir 13 units twice daily as noted above blood sugars appear to be more in the mid 100s to mid 200s-will defer to primary care provider since her stay here has been quite short.   7.  History of anemia with chronic kidney disease she is on iron supplementation hemoglobin has shown stability at 12.2 on lab done on March 7 this is followed by dialysis.  8.  History of chronic leukocytosis this will warrant oncology hematology follow-up will defer to primary care provider since she will see her provider later this week-white count actually was down somewhat at 17,600 on lab done March 9.  9.  History of GERD she does continue on Protonix this has not really been an issue during her stay here to my knowledge.  #10 history of back pain again she does have an order for Voltaren gel.  11.  History of seasonal allergies she is on Flonase.    -  Of note there is recommendation  for cardiac follow-up for a possible monitor this will be it appears [facility] and Dr. Percival Spanish- have written an order for this and I suspect will need follow-up by primary care provider.  Again she will be going home with her husband she will need PT and OT for further strengthening as well as nursing support for multiple medical issues she also will need a 3 in 1 commode as well as oxygen supplementation for now and a wheelchair-she still does have significant weakness.  She does have primary care follow-up on  Friday.  [id number]- of note   greater than 30 minutes spent on this discharge summary- greater than 50% of time spent coordinating plan of care for numerous diagnoses

## 2018-03-26 ENCOUNTER — Other Ambulatory Visit: Payer: Self-pay | Admitting: Internal Medicine

## 2018-03-26 MED ORDER — ONETOUCH DELICA LANCETS 33G MISC: 1.0000 | 0 refills | Status: AC | PRN

## 2018-03-26 MED ORDER — INSULIN PEN NEEDLE 29G X 12MM MISC: 1.0000 "pen " | Freq: Two times a day (BID) | 0 refills | Status: AC

## 2018-03-26 MED ORDER — INSULIN DETEMIR 100 UNIT/ML FLEXPEN: 13.0000 [IU] | PEN_INJECTOR | Freq: Two times a day (BID) | SUBCUTANEOUS | 0 refills | Status: DC

## 2018-03-26 MED ORDER — ATORVASTATIN CALCIUM 80 MG PO TABS: 80.0000 mg | ORAL_TABLET | Freq: Every day | ORAL | 0 refills | Status: DC

## 2018-03-26 MED ORDER — ONETOUCH ULTRA 2 W/DEVICE KIT: 1.0000 | PACK | Freq: Three times a day (TID) | 0 refills | Status: AC

## 2018-03-26 MED ORDER — PANTOPRAZOLE SODIUM 40 MG PO TBEC: 40.0000 mg | DELAYED_RELEASE_TABLET | Freq: Every day | ORAL | 0 refills | Status: DC

## 2018-03-26 MED ORDER — "INSULIN SYRINGE-NEEDLE U-100 30G X 5/16"" 1 ML MISC": 0 refills | Status: AC

## 2018-03-26 MED ORDER — GLUCOSE BLOOD VI STRP: 1.0000 | ORAL_STRIP | Freq: Three times a day (TID) | 0 refills | Status: AC

## 2018-03-26 MED ORDER — FERRIC CITRATE 1 GM 210 MG(FE) PO TABS: 420.0000 mg | ORAL_TABLET | Freq: Three times a day (TID) | ORAL | 0 refills | Status: AC

## 2018-03-26 MED ORDER — FLUTICASONE PROPIONATE 50 MCG/ACT NA SUSP: 1.0000 | Freq: Every day | NASAL | 0 refills | Status: DC

## 2018-03-26 MED ORDER — HYDROXYZINE HCL 50 MG PO TABS: 50.0000 mg | ORAL_TABLET | Freq: Two times a day (BID) | ORAL | 0 refills | Status: DC | PRN

## 2018-03-26 MED ORDER — PRO-STAT SUGAR FREE PO LIQD: 30.0000 mL | Freq: Two times a day (BID) | ORAL | 0 refills | Status: DC

## 2018-03-26 MED ORDER — MIDODRINE HCL 10 MG PO TABS: 10.0000 mg | ORAL_TABLET | Freq: Three times a day (TID) | ORAL | 0 refills | Status: DC

## 2018-03-26 MED ORDER — DICLOFENAC SODIUM 1 % TD GEL: 4.0000 g | Freq: Four times a day (QID) | TRANSDERMAL | 0 refills | Status: DC

## 2018-03-26 MED ORDER — CYCLOBENZAPRINE HCL 10 MG PO TABS: 10.0000 mg | ORAL_TABLET | Freq: Every day | ORAL | 0 refills | Status: DC

## 2018-03-26 MED ORDER — INSULIN PEN NEEDLE 31G X 5 MM MISC: 0 refills | Status: DC

## 2018-03-26 MED ORDER — FREESTYLE SYSTEM KIT: 1.0000 | PACK | 0 refills | Status: DC | PRN

## 2018-03-28 ENCOUNTER — Telehealth: Payer: Self-pay | Admitting: Family Medicine

## 2018-03-28 ENCOUNTER — Inpatient Hospital Stay (INDEPENDENT_AMBULATORY_CARE_PROVIDER_SITE_OTHER): Payer: Medicare Other | Admitting: Orthopedic Surgery

## 2018-03-28 NOTE — Telephone Encounter (Signed)
2nd attempt, left message.

## 2018-03-28 NOTE — Telephone Encounter (Signed)
Spoke with [place]. Patient is being discharged to home today, recommended calling spouse. Left message for patient with spouse regarding possible discussing things over the phone instead of in person visit tomorrow.

## 2018-03-28 NOTE — Telephone Encounter (Signed)
Called husband. No response.

## 2018-03-28 NOTE — Telephone Encounter (Signed)
Patient husband returned call. Left call back number of [phone].  Danley Danker, RN [facility] RN)

## 2018-03-28 NOTE — Telephone Encounter (Signed)
Spoke with husband as an alternative to in person visit tomorrow.  Patient is doing well and leaving [facility] today.  Discussed continued insulin regimen with regular home monitoring of glucose over the next 2 weeks.  Patient will call with home log for insulin titration.  Advised patient that has scheduled cardiology appointment in April 15.  Recommended referral for sleep study as well as hematology evaluation for chronic leukocytosis.  Patient's husband voiced good understanding and appreciation of alternative telephone encounter to in person visit at this time.

## 2018-03-29 ENCOUNTER — Ambulatory Visit: Payer: Medicare Other | Admitting: Family Medicine

## 2018-03-29 ENCOUNTER — Other Ambulatory Visit: Payer: Self-pay

## 2018-03-29 ENCOUNTER — Encounter (HOSPITAL_COMMUNITY): Payer: Self-pay | Admitting: Emergency Medicine

## 2018-03-29 ENCOUNTER — Emergency Department (HOSPITAL_COMMUNITY)
Admission: EM | Admit: 2018-03-29 | Discharge: 2018-03-29 | Disposition: A | Discharge status: Home / Self Care | Payer: Medicare Other | Attending: Emergency Medicine | Admitting: Emergency Medicine

## 2018-03-29 ENCOUNTER — Emergency Department (HOSPITAL_COMMUNITY): Payer: Medicare Other

## 2018-03-29 DIAGNOSIS — E119 Type 2 diabetes mellitus without complications: Secondary | ICD-10-CM | POA: Insufficient documentation

## 2018-03-29 DIAGNOSIS — I132 Hypertensive heart and chronic kidney disease with heart failure and with stage 5 chronic kidney disease, or end stage renal disease: Secondary | ICD-10-CM | POA: Diagnosis not present

## 2018-03-29 DIAGNOSIS — Z79899 Other long term (current) drug therapy: Secondary | ICD-10-CM | POA: Diagnosis not present

## 2018-03-29 DIAGNOSIS — I509 Heart failure, unspecified: Secondary | ICD-10-CM | POA: Insufficient documentation

## 2018-03-29 DIAGNOSIS — N186 End stage renal disease: Secondary | ICD-10-CM | POA: Diagnosis present

## 2018-03-29 LAB — BASIC METABOLIC PANEL
Anion gap: 14 (ref 5–15)
BUN: 77 mg/dL — ABNORMAL HIGH (ref 6–20)
CO2: 25 mmol/L (ref 22–32)
Calcium: 9 mg/dL (ref 8.9–10.3)
Chloride: 93 mmol/L — ABNORMAL LOW (ref 98–111)
Creatinine, Ser: 10.42 mg/dL — ABNORMAL HIGH (ref 0.44–1.00)
GFR calc Af Amer: 4 mL/min — ABNORMAL LOW (ref >60)
GFR calc non Af Amer: 4 mL/min — ABNORMAL LOW (ref >60)
Glucose, Bld: 99 mg/dL (ref 70–99)
Potassium: 4.2 mmol/L (ref 3.5–5.1)
Sodium: 132 mmol/L — ABNORMAL LOW (ref 135–145)

## 2018-03-29 NOTE — TOC Transition Note (Signed)
Transition of Care [facility]) - CM/SW Discharge Note   Patient Details  Name: MELBA ARAKI MRN: [id number] Date of Birth: 1960/07/20  [facility] CM/SW Contact:  Fuller Mandril, RN Phone Number: 03/29/2018, 11:28 AM   Clinical Narrative:    [facility] consulted regarding pt need for transportation to HD.     Final next level of care: Home/Self Care Barriers to Discharge: Barriers Resolved   Patient Goals and CMS Choice Patient states their goals for this hospitalization and ongoing recovery are:: get to HD thorugh [facility] with [facility] accessibility      Discharge Placement     [facility] arranged with [facility] to pick pt up from HD with wheelchair accessible van.  Pt husband Gwenlyn Perking) will take pt wheelchair and O2 to HD Center for transport home when pt finishes HD.              Discharge Plan and Services   Discharge Planning Services: CM Consult               Pt understands to call [facility] to set up wheelchair accessible transportation for future HD appointments.        Social Determinants of Health (SDOH) Interventions     Readmission Risk Interventions No flowsheet data found.

## 2018-03-29 NOTE — ED Provider Notes (Signed)
[facility] EMERGENCY DEPARTMENT Provider Note   CSN: [id number] Arrival date & time: 03/29/18  0809    History   Chief Complaint Chief Complaint  Patient presents with   Missed Dialysis    HPI Rachel Chaney is a 58 y.o. female.     58 year old female brought in by EMS for needing dialysis.  Patient was discharged from [facility] yesterday back to home following a lengthy admission for right foot infection with amputation of toes/rays, complicated course of admission with cardiac arrest, PNA, respiratory failure.  Patient normally has dialysis on Monday, Wednesday, Friday.  Patient states that she has not had dialysis since Friday of last week, Rachel why she did not have dialysis while the nursing facility however states that she is having difficulty ambulating at home and her family members were unable to to get her to dialysis today.  Patient states that overall she is feeling well today and has no other complaints.     Past Medical History:  Diagnosis Date   CHF (congestive heart failure) ([place])    Chronic kidney disease    Chronic kidney disease due to diabetes mellitus ([facility]) 10/13/2013   Diabetes mellitus    Hyperlipidemia    Hypertension     Patient Active Problem List   Diagnosis Date Noted   Cardiac arrest ([place]) 03/19/2018   PAF (paroxysmal atrial fibrillation) ([place])    Respiratory distress    Pulmonary infiltrates    Ventilator associated pneumonia ([place])    Oxygen dependent    Hypotension    Respiratory failure ([place])    Type 2 diabetes mellitus with diabetic neuropathy (HCC)    Abscess of toe of right foot 02/26/2018   Diabetes mellitus type 2, uncontrolled ([place]) 02/26/2018   Toe infection    Diabetic polyneuropathy associated with type 2 diabetes mellitus ([place])    Osteomyelitis of second toe of right foot ([place])    Severe protein-calorie malnutrition ([place])    Open toe fracture 02/25/2018    Seasonal allergies 04/12/2017   Post-menopausal bleeding 12/07/2015   ESRD on dialysis ([place]) 05/25/2015   Hypoalbuminemia    Abnormal appearance of cervix 08/08/2011   Back pain 08/07/2011   GERD (gastroesophageal reflux disease) 04/27/2010   Hyperlipidemia 03/08/2006   Morbid obesity ([place]) 03/08/2006   Essential hypertension with morbid obesity 03/08/2006    Past Surgical History:  Procedure Laterality Date   AMPUTATION Right 02/27/2018   Procedure: RIGHT FOOT 2ND AND 3RD RAY AMPUTATION;  Surgeon: [name], MD;  Location: [facility];  Service: Orthopedics;  Laterality: Right;   AV FISTULA PLACEMENT Left 04/15/2015   Procedure: ARTERIOVENOUS (AV) FISTULA CREATION;  Surgeon: Mal Misty, MD;  Location: [facility];  Service: Vascular;  Laterality: Left;   EYE SURGERY Right    retinal detachment   FISTULA SUPERFICIALIZATION Left 06/03/2015   Procedure: LEFT UPPER ARM BRACHIOCEPHALIC FISTULA SUPERFICIALIZATION;  Surgeon: Mal Misty, MD;  Location: [facility];  Service: Vascular;  Laterality: Left;  from [place] to General     OB History   No obstetric history on file.      Home Medications    Prior to Admission medications   Medication Sig Start Date End Date Taking? Authorizing Provider  acetaminophen (TYLENOL) 500 MG tablet Take 1,000 mg by mouth every 6 (six) hours as needed for pain.   Yes [provider]  Amino Acids-Protein Hydrolys (FEEDING SUPPLEMENT, PRO-STAT SUGAR FREE 64,) LIQD Take 30 mLs by mouth 2 (  two) times daily. 03/26/18   [place] C, PA-C  atorvastatin (LIPITOR) 80 MG tablet Take 1 tablet (80 mg total) by mouth daily. 03/26/18   [place] C, PA-C  bisacodyl (DULCOLAX) 10 MG suppository Constipation : give 10 mg Bisacodyl suppositiory rectally X 1 dose in 24 hours as needed (Do not use constipation standing orders for residents with renal failure/CFR less than 30. Contact MD for orders)    [provider]  Blood Glucose Monitoring Suppl  (ONE TOUCH ULTRA 2) w/Device KIT 1 kit by Does not apply route 3 (three) times daily. ICD-10 code: E11.40 03/26/18   Wille Celeste, PA-C  cyclobenzaprine (FLEXERIL) 10 MG tablet Take 1 tablet (10 mg total) by mouth at bedtime. 03/26/18   Granville [place] C, PA-C  diclofenac sodium (VOLTAREN) 1 % GEL Apply 4 g topically 4 (four) times daily. 03/26/18   Gr[place] C, PA-C  ferric citrate (AURYXIA) 1 GM 210 MG(Fe) tablet Take 2 tablets (420 mg total) by mouth 3 (three) times daily with meals. Give two (420 mg) by mouth three times a day wih meals for ESRD 03/26/18   Granville [place] C, PA-C  fluticasone (FLONASE) 50 MCG/ACT nasal spray Place 1 spray into both nostrils daily. 1 spray in each nostril every day 03/26/18   Gr[place]ville [place] C, PA-C  glucose blood (ONE TOUCH ULTRA TEST) test strip 1 each by Other route 3 (three) times daily. ICD-10 code: E11.40. 03/26/18   Gr[place]ville [place] C, PA-C  glucose monitoring kit (FREESTYLE) monitoring kit 1 each by Does not apply route as needed for other. 03/26/18   [place] [facility] C, PA-C  hydrOXYzine (ATARAX/VISTARIL) 50 MG tablet Take 1 tablet (50 mg total) by mouth 2 (two) times daily as needed for itching. 03/26/18   Wille Celeste, PA-C  Insulin Detemir (LEVEMIR FLEXTOUCH) 100 UNIT/ML Pen Inject 13 Units into the skin 2 (two) times daily for 30 days. Inject 13Units SQ bid for DM 03/26/18 04/25/18  Gr[place]ville [facility] C, PA-C  Insulin Pen Needle (B-D UF III MINI PEN NEEDLES) 31G X 5 MM MISC USE AS DIRECTED FOR INSULIN INJECTION TWICE DAILY 03/26/18   Granville [facility] C, PA-C  Insulin Pen Needle 29G X 12MM MISC Inject 1 pen into the skin 2 (two) times daily. Check blood sugar daily. 03/26/18   Gr[place]ville [facility] C, PA-C  Insulin Syringe-Needle U-100 Flossie Buffy INSULIN SYRINGE) 30G X 5/16" 1 ML MISC USE AS DIRECTED 03/26/18   Gr[place] [facility] C, PA-C  midodrine (PROAMATINE) 10 MG tablet Take 1 tablet (10 mg total) by mouth 3 (three) times daily with meals. 03/26/18   Wille Celeste, PA-C  Multiple Vitamin  (MULTIVITAMINS PO) Give one by mouth at bedtime for supplement    [provider]  OneTouch Delica Lancets 44R MISC 1 Device by Does not apply route as needed (to check blood glucose). 03/26/18   Granville [facility] C, PA-C  pantoprazole (PROTONIX) 40 MG tablet Take 1 tablet (40 mg total) by mouth daily. 03/26/18   Gr[place]ville [place] C, PA-C  Sodium Phosphates (RA SALINE ENEMA RE) Constipation  If not relieved by Biscodyl suppository, give disposable Saline Enema rectally X 1 dose/24 hrs as needed (Do not use constipation standing orders for residents with renal failure/CFR less than 30. Contact MD for orders)    [provider]  progesterone (PROMETRIUM) 200 MG capsule Take 1 capsule (200 mg total) by mouth daily. Take for 10 days if bleeding occurs for more than 4 days. 11/09/10 03/28/11  Lyndal Pulley, DO    Family History Family History  Problem Relation Age of Onset   Heart disease Mother    Heart disease Father     Social History Social History   Tobacco Use   Smoking status: Never Smoker   Smokeless tobacco: Never Used  Substance Use Topics   Alcohol use: No    Alcohol/week: 0.0 standard drinks   Drug use: No     Allergies   Patient has no known allergies.   Review of Systems Review of Systems  Constitutional: Negative for fever.  Respiratory: Negative for stridor.   Cardiovascular: Negative for chest pain.  Allergic/Immunologic: Positive for immunocompromised state.  Psychiatric/Behavioral: Negative for confusion.     Physical Exam Updated Vital Signs BP (!) 156/74 (BP Location: Right Arm)    Pulse 97    Temp 98.5 F (36.9 C) (Oral)    Resp 17    LMP 09/18/2011    SpO2 97%   Physical Exam Vitals signs and nursing note reviewed.  Constitutional:      General: She is not in acute distress.    Appearance: She is obese. She is not ill-appearing, toxic-appearing or diaphoretic.  HENT:     Head: Normocephalic and atraumatic.     Mouth/Throat:      Mouth: Mucous membranes are moist.  Neck:     Musculoskeletal: Neck supple.  Cardiovascular:     Rate and Rhythm: Normal rate and regular rhythm.     Pulses: Normal pulses.  Musculoskeletal:     Right lower leg: No edema.     Left lower leg: No edema.     Comments: Right foot post op wound, no drainage, no evidence of infection.   Skin:    General: Skin is warm and dry.     Findings: No erythema.  Neurological:     Mental Status: She is alert. Mental status is at baseline.  Psychiatric:        Behavior: Behavior normal.      ED Treatments / Results  Labs (all labs ordered are listed, but only abnormal results are displayed) Labs Reviewed  BASIC METABOLIC PANEL - Abnormal; Notable for the following components:      Result Value   Sodium 132 (*)    Chloride 93 (*)    BUN 77 (*)    Creatinine, Ser 10.42 (*)    GFR calc non Af Amer 4 (*)    GFR calc Af Amer 4 (*)    All other components within normal limits    EKG EKG Interpretation  Date/Time:  Friday March 29 2018 08:25:54 EDT Ventricular Rate:  97 PR Interval:    QRS Duration: 100 QT Interval:  385 QTC Calculation: 490 R Axis:   -15 Text Interpretation:  Sinus rhythm Borderline left axis deviation Anterior infarct, old No significant change since last tracing Confirmed by Dorie Rank [phone]) on 03/29/2018 8:55:45 AM   Radiology Dg Chest Port 1 View  Result Date: 03/29/2018 CLINICAL DATA:  58 year old female missed dialysis earlier this week. Shortness of breath. EXAM: PORTABLE CHEST 1 VIEW COMPARISON:  03/10/2018 and earlier. FINDINGS: Portable AP semi upright view at 0853 hours. Stable cardiac size and mediastinal contours. Visualized tracheal air column is within normal limits. No pneumothorax. No pleural effusion is evident. Streaky and patchy bibasilar opacity and pulmonary vascular congestion appearing very similar to the 03/10/2018 exam. Paucity bowel gas in the upper abdomen. No acute osseous abnormality  identified. IMPRESSION: Abnormal pulmonary opacity  appearing very similar to the 03/10/2018 portable exam and favored to reflect pulmonary vascular congestion with basilar atelectasis. Electronically Signed   By: Genevie Ann M.D.   On: 03/29/2018 09:15    Procedures Procedures (including critical care time)  Medications Ordered in ED Medications - No data to display   Initial Impression / Assessment and Plan / ED Course  I have reviewed the triage vital signs and the nursing notes.  Pertinent labs & imaging results that were available during my care of the patient were reviewed by me and considered in my medical decision making (see chart for details).  Clinical Course as of Mar 29 1134  Fri Mar 29, 2159  2451 58 year old female with history of chronic kidney disease brought in by EMS for dialysis.  Patient was discharged from skilled nursing facility yesterday, states that she was unable to get to dialysis today so she called 911 to come to the hospital for dialysis.  Patient denies any complaints at this time.  Patient on reassessment states that her last dialysis was on Monday, she missed Wednesday.  BMP with potassium of 4.2.  Contact with patient's dialysis center at the [facility] center who is able to see patient and do her dialysis today.  Plan is to talk to case management and arrange transportation.   [LM]    Clinical Course User Index [LM] Tacy Learn, PA-C      Final Clinical Impressions(s) / ED Diagnoses   Final diagnoses:  End stage renal disease [facility])    ED Discharge Orders    None       Roque Lias 03/29/18 1137    Dorie Rank, MD 03/30/18 [phone]

## 2018-03-29 NOTE — ED Triage Notes (Signed)
Pt arrives to ED from home with complaints of missing her last two dialysis treatments. EMS reports she had recent surgery on her right foot and now is having trouble walking and cannot get out of the house to her treatments.

## 2018-03-29 NOTE — Discharge Instructions (Addendum)
Discharged to your dialysis center for treatment today. Follow up with your doctor for further care.

## 2018-04-04 ENCOUNTER — Telehealth (INDEPENDENT_AMBULATORY_CARE_PROVIDER_SITE_OTHER): Payer: Self-pay | Admitting: Orthopedic Surgery

## 2018-04-04 NOTE — Telephone Encounter (Signed)
Sharyn Lull called asking for verbal orders, For 1x a week for 1 week  2x a week for 4 weeks 1 x a week for 1 week

## 2018-04-04 NOTE — Telephone Encounter (Signed)
I called and lm on vm for [facility]. I advised that I see the pt had surgery with Dr. Sharol Given on the end of February right foor 1st and 2nd ray amputation but never had any follow up in the office. Also that the message that I received does not indicate what she is needing order for probably physical therapy but not indicated. I asked for a call back to advise and also to see if she could advise the pt that she needs an appt in the office. I tried to call the pt and there is no answer and the mailbox is full.

## 2018-04-08 ENCOUNTER — Telehealth (INDEPENDENT_AMBULATORY_CARE_PROVIDER_SITE_OTHER): Payer: Self-pay | Admitting: Orthopedic Surgery

## 2018-04-08 NOTE — Telephone Encounter (Signed)
Shawn with [facility] called to request VO for PT starting 03/31/18 for 3x a week for 1 week, 2x a week for 5 weeks, and 1x a week for 2 weeks also requesting [facility] for 2x a week for 3 weeks,  He also wanted Dr. Sharol Given to know that her O2 sats were between 83-89 on PT eval.  CB#[phone].  He does have a secure voicemail.

## 2018-04-08 NOTE — Telephone Encounter (Signed)
I called pt and was able to reach her. Pt had 2nd and 3rd ray amputation on 02/27/18 needs post op appt. appt made for Thursday said that she was not able to come on Tuesday needs to arrange transportation and can not come Wednesday due to dialysis. Pt will call with questions otherwise will see on Thursday at 9:30

## 2018-04-09 ENCOUNTER — Telehealth (INDEPENDENT_AMBULATORY_CARE_PROVIDER_SITE_OTHER): Payer: Self-pay | Admitting: Orthopedic Surgery

## 2018-04-09 NOTE — Telephone Encounter (Signed)
Michelle/OT/[facility] stated patient called this morning wants PT & OT on hold for now until she see Dr. Baruch Gouty needs verbal orders. Pt Has Dialysis on M,W.F   Order to discontinue [place]

## 2018-04-09 NOTE — Telephone Encounter (Signed)
Pt has an appt  04/11/18.i spoke with Sharyn Lull and advised that yes we can update orders after pt has been evaluated in the office. She had surgery 02/26/18 and never followed back up in the office. Pt states that she does not need a HHA so advised that it is ok to d/c this. Will call with PT,OT an HHN orders after appt. Will hold this message until that time.

## 2018-04-10 ENCOUNTER — Telehealth (INDEPENDENT_AMBULATORY_CARE_PROVIDER_SITE_OTHER): Payer: Self-pay

## 2018-04-10 ENCOUNTER — Telehealth (INDEPENDENT_AMBULATORY_CARE_PROVIDER_SITE_OTHER): Payer: Medicare Other | Admitting: Family Medicine

## 2018-04-10 ENCOUNTER — Other Ambulatory Visit: Payer: Self-pay

## 2018-04-10 DIAGNOSIS — E11649 Type 2 diabetes mellitus with hypoglycemia without coma: Secondary | ICD-10-CM | POA: Diagnosis not present

## 2018-04-10 NOTE — Progress Notes (Signed)
  [facility] Telemedicine Visit  Patient consented to have visit conducted via telephone.  Encounter participants: Patient: Rachel Chaney  Provider: Steve Rattler  Others (if applicable): Spouse Rachel Chaney  Chief Complaint: hypoglycemia  HPI:  Blood sugar dropping for the past 2 days 70 and 65 the past 2 mornings. Feels sweaty and hot when sugars are low  Getting 120-140 during the day otherwise and does not have symptoms of low sugars during day Taking 50 of levemir BID That dose has not changed recently per patient   Reports appetite is "pretty good", eats 3 meals a day and drinks apple juice, orange juice, sprite throughout day  ROS: no chest pain, SOB, increased thirst, hunger, or urination  Pertinent PMHx: T2DM, ESRD  Exam:  Respiratory: speaks in full sentences  Assessment/Plan:  1. Uncontrolled type 2 diabetes mellitus with hypoglycemia without coma [facility]) Patient reports taking 50 units levemir BID. She is adament this is her dose and it has not changed. From chart review it looks like she is supposed to be on 13 units BID. Unclear what the disconnect is. Her sugars are well controlled during the day and it certainly sounds like patient has a carb heavy diet including a lot of fruit juice and sodas. She has low sugars the past 2 mornings. I advised decreasing levemir to 40 units every night. Continue 50 every morning. Asked her to call in on Friday to discuss sugars with this change, advised her we may need to decrease regimen further. She is agreeable to this.    Time spent on phone with patient: 7 minutes  Lucila Maine, DO PGY-3, [facility] 04/10/2018 2:32 PM

## 2018-04-10 NOTE — Telephone Encounter (Signed)
I called and sw pt and she answered NO to all COVID-19 questions. Pt has an appt tomorrow with Dr. Sharol Given

## 2018-04-11 ENCOUNTER — Other Ambulatory Visit (INDEPENDENT_AMBULATORY_CARE_PROVIDER_SITE_OTHER): Payer: Self-pay | Admitting: Physician Assistant

## 2018-04-11 ENCOUNTER — Encounter (INDEPENDENT_AMBULATORY_CARE_PROVIDER_SITE_OTHER): Payer: Self-pay | Admitting: Orthopedic Surgery

## 2018-04-11 ENCOUNTER — Ambulatory Visit (INDEPENDENT_AMBULATORY_CARE_PROVIDER_SITE_OTHER): Payer: Medicare Other | Admitting: Physician Assistant

## 2018-04-11 ENCOUNTER — Other Ambulatory Visit: Payer: Self-pay

## 2018-04-11 VITALS — Ht 64.0 in | Wt 216.0 lb

## 2018-04-11 DIAGNOSIS — N186 End stage renal disease: Secondary | ICD-10-CM

## 2018-04-11 DIAGNOSIS — E1140 Type 2 diabetes mellitus with diabetic neuropathy, unspecified: Secondary | ICD-10-CM

## 2018-04-11 DIAGNOSIS — Z992 Dependence on renal dialysis: Secondary | ICD-10-CM

## 2018-04-11 DIAGNOSIS — Z794 Long term (current) use of insulin: Secondary | ICD-10-CM

## 2018-04-11 DIAGNOSIS — Z9981 Dependence on supplemental oxygen: Secondary | ICD-10-CM

## 2018-04-11 DIAGNOSIS — Z89422 Acquired absence of other left toe(s): Secondary | ICD-10-CM

## 2018-04-11 DIAGNOSIS — I480 Paroxysmal atrial fibrillation: Secondary | ICD-10-CM

## 2018-04-11 MED ORDER — SILVER SULFADIAZINE 1 % EX CREA: 1.0000 "application " | TOPICAL_CREAM | Freq: Every day | CUTANEOUS | 0 refills | Status: DC

## 2018-04-11 MED ORDER — PENTOXIFYLLINE ER 400 MG PO TBCR: 400.0000 mg | EXTENDED_RELEASE_TABLET | Freq: Three times a day (TID) | ORAL | 1 refills | Status: DC

## 2018-04-11 MED ORDER — DOXYCYCLINE HYCLATE 100 MG PO CAPS: 100.0000 mg | ORAL_CAPSULE | Freq: Two times a day (BID) | ORAL | 1 refills | Status: DC

## 2018-04-11 NOTE — Progress Notes (Signed)
Office Visit Note   Patient: Rachel Chaney           Date of Birth: 11-08-1960           MRN: [id number] Visit Date: 04/11/2018              Requested by: Bufford Lope, DO [address] [place], [place] [address] PCP: Bufford Lope, DO  Chief Complaint  Patient presents with  . Right Foot - Routine Post Op    02/27/18 right 2nd and 3rd ray amputation. First post op appt.       HPI: The patient is a 58 year old woman who is seen for postoperative follow-up following right foot second and third ray amputations on 02/27/2018.  Her postoperative course was complicated by an apparent cardiac arrest which required a prolonged intubation and she remained on the ventilator for a couple of weeks.  She continues to be oxygen dependent.  She has been discharged home and reports that home health care is been coming in 2 days a week for dressing changes to the right foot.  She is about 6 weeks postop now.  She has noted some odor with the dressing changes.  She has noted some drainage with the dressing changes. She has end-stage renal disease and is on hemodialysis Monday Wednesday Friday.  She has type 2 diabetes.  Anemia of chronic kidney disease and heart failure with preserved ejection fraction. Assessment & Plan: Visit Diagnoses:  1. S/P amputation of lesser toe, left ([place])   2. Type 2 diabetes mellitus with diabetic neuropathy, with long-term current use of insulin ([place])   3. ESRD on dialysis ([place])   4. Oxygen dependent   5. PAF (paroxysmal atrial fibrillation) ([employer])     Plan: Sutures were harvested this visit.  We have concerns for worsening ischemic changes of the right foot.  She does have biphasic pulses by Doppler in the office today.  We are going to start some Trental 400 mg p.o. 3 times daily as well as doxycycline 100 mg p.o. twice daily and Silvadene cream to the site daily with dressing changes.  She may need further evaluation by vascular surgery should she have progression of  her ischemia.  She will follow-up in 1 week.  Follow-Up Instructions: Return in about 1 week (around 04/18/2018).   Ortho Exam  Patient is alert, oriented, no adenopathy, well-dressed, normal affect, normal respiratory effort. The right foot has some progressive ischemic changes with dark eschar forming over the dorsum of the foot proximally and over the right great toe laterally.  There is dehiscence of the amputation site at the second and third ray amputations.  There is some odor and minimal serous appearing drainage.  Necrotic gray and yellow tissue was debrided from the operative site.  Sutures were harvested this visit.  Imaging: No results found.    Labs: Lab Results  Component Value Date   HGBA1C 11.4 (H) 02/26/2018   HGBA1C 9.1 (A) 08/23/2017   HGBA1C 8.6 04/12/2017   ESRSEDRATE 58 (H) 04/12/2015   ESRSEDRATE 85 (H) 09/11/2013   ESRSEDRATE 77 (H) 10/16/2011   CRP 0.6 04/12/2015   REPTSTATUS 03/15/2018 FINAL 03/10/2018   GRAMSTAIN  02/27/2018    ABUNDANT WBC PRESENT, PREDOMINANTLY PMN ABUNDANT GRAM POSITIVE COCCI    CULT  03/10/2018    NO GROWTH 5 DAYS Performed at [facility], [place] [address] [place], [place] [address]    [place] 02/27/2018  Lab Results  Component Value Date   ALBUMIN 2.3 (L) 03/13/2018   ALBUMIN 2.2 (L) 03/11/2018   ALBUMIN 2.4 (L) 03/10/2018    Body mass index is 37.08 kg/m.  Orders:  No orders of the defined types were placed in this encounter.  Meds ordered this encounter  Medications  . doxycycline (VIBRAMYCIN) 100 MG capsule    Sig: Take 1 capsule (100 mg total) by mouth 2 (two) times daily.    Dispense:  28 capsule    Refill:  1  . DISCONTD: pentoxifylline (TRENTAL) 400 MG CR tablet    Sig: Take 1 tablet (400 mg total) by mouth 3 (three) times daily with meals.    Dispense:  90 tablet    Refill:  1  . silver sulfADIAZINE (SILVADENE) 1 % cream    Sig: Apply 1 application topically daily.     Dispense:  50 g    Refill:  0     Procedures: No procedures performed  Clinical Data: No additional findings.  ROS:  All other systems negative, except as noted in the HPI. Review of Systems  Objective: Vital Signs: Ht _0  (1.626 m)   Wt 216 lb (98 kg)   LMP 09/18/2011   BMI 37.08 kg/m   Specialty Comments:  No specialty comments available.  PMFS History: Patient Active Problem List   Diagnosis Date Noted  . Cardiac arrest ([place]) 03/19/2018  . PAF (paroxysmal atrial fibrillation) ([place])   . Respiratory distress   . Pulmonary infiltrates   . Ventilator associated pneumonia ([place])   . Oxygen dependent   . Hypotension   . Respiratory failure ([place])   . Type 2 diabetes mellitus with diabetic neuropathy ([place])   . Abscess of toe of right foot 02/26/2018  . Diabetes mellitus type 2, uncontrolled ([place]) 02/26/2018  . Toe infection   . Diabetic polyneuropathy associated with type 2 diabetes mellitus ([place])   . Osteomyelitis of second toe of right foot ([place])   . Severe protein-calorie malnutrition ([place])   . Open toe fracture 02/25/2018  . Seasonal allergies 04/12/2017  . Post-menopausal bleeding 12/07/2015  . ESRD on dialysis ([place]) 05/25/2015  . Hypoalbuminemia   . Abnormal appearance of cervix 08/08/2011  . Back pain 08/07/2011  . GERD (gastroesophageal reflux disease) 04/27/2010  . Hyperlipidemia 03/08/2006  . Morbid obesity ([place]) 03/08/2006  . Essential hypertension with morbid obesity 03/08/2006   Past Medical History:  Diagnosis Date  . CHF (congestive heart failure) ([place])   . Chronic kidney disease   . Chronic kidney disease due to diabetes mellitus ([place]) 10/13/2013  . Diabetes mellitus   . Hyperlipidemia   . Hypertension     Family History  Problem Relation Age of Onset  . Heart disease Mother   . Heart disease Father     Past Surgical History:  Procedure Laterality Date  . AMPUTATION Right 02/27/2018   Procedure: RIGHT FOOT 2ND AND 3RD RAY AMPUTATION;   Surgeon: [name], MD;  Location: [facility];  Service: Orthopedics;  Laterality: Right;  . AV FISTULA PLACEMENT Left 04/15/2015   Procedure: ARTERIOVENOUS (AV) FISTULA CREATION;  Surgeon: Mal Misty, MD;  Location: [facility];  Service: Vascular;  Laterality: Left;  . EYE SURGERY Right    retinal detachment  . FISTULA SUPERFICIALIZATION Left 06/03/2015   Procedure: LEFT UPPER ARM BRACHIOCEPHALIC FISTULA SUPERFICIALIZATION;  Surgeon: Mal Misty, MD;  Location: [facility];  Service: Vascular;  Laterality: Left;  from [place] to General  Social History   Occupational History  . Not on file  Tobacco Use  . Smoking status: Never Smoker  . Smokeless tobacco: Never Used  Substance and Sexual Activity  . Alcohol use: No    Alcohol/week: 0.0 standard drinks  . Drug use: No  . Sexual activity: Never

## 2018-04-12 ENCOUNTER — Telehealth (INDEPENDENT_AMBULATORY_CARE_PROVIDER_SITE_OTHER): Payer: Self-pay

## 2018-04-12 NOTE — Telephone Encounter (Signed)
Pt was called.

## 2018-04-12 NOTE — Telephone Encounter (Signed)
Nanine Means [facility] [phone] Salome Arnt are clinical managers for pt health aide was given orders for right foot dressing changes and to use silvadene cream with guaze, wash once daily, apply kerlix or ace wrap. Nurse understood orders. Sharyn Lull was called to give VO for OT and PT for strengthening and gait training w/ pt being non-wtb on right foot. Will call pt and discuss this with her.

## 2018-04-17 ENCOUNTER — Telehealth (INDEPENDENT_AMBULATORY_CARE_PROVIDER_SITE_OTHER): Payer: Self-pay

## 2018-04-17 NOTE — Telephone Encounter (Signed)
I called and pt answered NO to all COVID-19 questions.

## 2018-04-18 ENCOUNTER — Encounter (INDEPENDENT_AMBULATORY_CARE_PROVIDER_SITE_OTHER): Payer: Self-pay | Admitting: Orthopedic Surgery

## 2018-04-18 ENCOUNTER — Other Ambulatory Visit: Payer: Self-pay

## 2018-04-18 ENCOUNTER — Ambulatory Visit (INDEPENDENT_AMBULATORY_CARE_PROVIDER_SITE_OTHER): Payer: Medicare Other | Admitting: Physician Assistant

## 2018-04-18 ENCOUNTER — Ambulatory Visit (INDEPENDENT_AMBULATORY_CARE_PROVIDER_SITE_OTHER): Payer: Self-pay | Admitting: Physician Assistant

## 2018-04-18 ENCOUNTER — Telehealth (INDEPENDENT_AMBULATORY_CARE_PROVIDER_SITE_OTHER): Payer: Self-pay

## 2018-04-18 VITALS — Ht 64.0 in | Wt 216.0 lb

## 2018-04-18 DIAGNOSIS — I480 Paroxysmal atrial fibrillation: Secondary | ICD-10-CM

## 2018-04-18 DIAGNOSIS — Z794 Long term (current) use of insulin: Secondary | ICD-10-CM

## 2018-04-18 DIAGNOSIS — Z89422 Acquired absence of other left toe(s): Secondary | ICD-10-CM

## 2018-04-18 DIAGNOSIS — N186 End stage renal disease: Secondary | ICD-10-CM

## 2018-04-18 DIAGNOSIS — Z992 Dependence on renal dialysis: Secondary | ICD-10-CM

## 2018-04-18 DIAGNOSIS — Z9981 Dependence on supplemental oxygen: Secondary | ICD-10-CM

## 2018-04-18 DIAGNOSIS — E1140 Type 2 diabetes mellitus with diabetic neuropathy, unspecified: Secondary | ICD-10-CM

## 2018-04-18 DIAGNOSIS — E440 Moderate protein-calorie malnutrition: Secondary | ICD-10-CM

## 2018-04-18 NOTE — Telephone Encounter (Signed)
Missed OT visit for today due to appointments today.  Patient has Dialysis on M, W, F.  Needs verbal order to move missed visit to end of plan of care.  [place], [place] with Nan[facility] Means

## 2018-04-18 NOTE — Progress Notes (Signed)
Office Visit Note   Patient: Rachel Chaney           Date of Birth: 12/07/1960           MRN: [id number] Visit Date: 04/18/2018              Requested by: Bufford Lope, DO [address] [place], [place] [address] PCP: Bufford Lope, DO  Chief Complaint  Patient presents with  . Right Foot - Routine Post Op    02/27/18 2nd & 3rd amp      HPI: The patient is a 58 yo woman who is seen for post operative follow up following right foot second and third ray amputations on 02/24/2018. Her post operative course was complicated by an apparent cardiac arrest which required a prolonged intubation and she remained ventilator dependent for a couple of weeks. She remains oxygen dependent following this event. She is having Home health to help with silvadene dressing changes to the right foot. He is going to dialysis on Mon-Wed-Fridays. She is having odor and drainage from the incision over the dorsal foot and the wound now tracks to the plantar surface. She has been on Doxycycline for the past week.   Assessment & Plan: Visit Diagnoses:  1. S/P amputation of lesser toe, left ([place])   2. Type 2 diabetes mellitus with diabetic neuropathy, with long-term current use of insulin ([place])   3. ESRD on dialysis ([place])   4. Oxygen dependent   5. PAF (paroxysmal atrial fibrillation) ([place])   6. Protein-calorie malnutrition, moderate ([place])     Plan: DR. Sharol Given discussed the poor healing of the amputation site with the patient and that she requires a transmetatarsal amputation at this time. Advised the patient that this will give her at least an 80% chance for healing and allow her to retain at least a portion of the right foot. She did agree to proceed with further surgery at this time as she understands that the infection in the foot may progress should she just continue antibiotics and local wound care.  She will follow up in the office following surgery.   Follow-Up Instructions: Return in about 2 weeks  (around 05/02/2018).   Ortho Exam  Patient is alert, oriented, no adenopathy, well-dressed, normal affect, normal respiratory effort. The right foot has good palpable and biphasic pulses by Doppler. The ischemic areas over the dorsum of the foot and the lateral great toe are stable. The dorsal incision has dehisced and there is tan brown drainage with odor and necrotic tissue. The wound tracks to the plantar surface of the foot at least ~ 3 cm. There is mild edema.   Imaging: No results found.   Labs: Lab Results  Component Value Date   HGBA1C 11.4 (H) 02/26/2018   HGBA1C 9.1 (A) 08/23/2017   HGBA1C 8.6 04/12/2017   ESRSEDRATE 58 (H) 04/12/2015   ESRSEDRATE 85 (H) 09/11/2013   ESRSEDRATE 77 (H) 10/16/2011   CRP 0.6 04/12/2015   REPTSTATUS 03/15/2018 FINAL 03/10/2018   GRAMSTAIN  02/27/2018    ABUNDANT WBC PRESENT, PREDOMINANTLY PMN ABUNDANT GRAM POSITIVE COCCI    CULT  03/10/2018    NO GROWTH 5 DAYS Performed at [facility], [place] [address], [place], [place] [address]    LABORGA STAPHYLOCOCCUS AUREUS 02/27/2018     Lab Results  Component Value Date   ALBUMIN 2.3 (L) 03/13/2018   ALBUMIN 2.2 (L) 03/11/2018   ALBUMIN 2.4 (L) 03/10/2018  Body mass index is 37.08 kg/m.  Orders:  No orders of the defined types were placed in this encounter.  No orders of the defined types were placed in this encounter.    Procedures: No procedures performed  Clinical Data: No additional findings.  ROS:  All other systems negative, except as noted in the HPI. Review of Systems  Objective: Vital Signs: Ht _0  (1.626 m)   Wt 216 lb (98 kg)   LMP 09/18/2011   BMI 37.08 kg/m   Specialty Comments:  No specialty comments available.  PMFS History: Patient Active Problem List   Diagnosis Date Noted  . Cardiac arrest ([place]) 03/19/2018  . PAF (paroxysmal atrial fibrillation) ([place])   . Respiratory distress   . Pulmonary infiltrates   . Ventilator associated  pneumonia ([place])   . Oxygen dependent   . Hypotension   . Respiratory failure ([place])   . Type 2 diabetes mellitus with diabetic neuropathy ([place])   . Abscess of toe of right foot 02/26/2018  . Diabetes mellitus type 2, uncontrolled ([place]) 02/26/2018  . Toe infection   . Diabetic polyneuropathy associated with type 2 diabetes mellitus ([place])   . Osteomyelitis of second toe of right foot ([place])   . Severe protein-calorie malnutrition ([place])   . Open toe fracture 02/25/2018  . Seasonal allergies 04/12/2017  . Post-menopausal bleeding 12/07/2015  . ESRD on dialysis ([place]) 05/25/2015  . Hypoalbuminemia   . Abnormal appearance of cervix 08/08/2011  . Back pain 08/07/2011  . GERD (gastroesophageal reflux disease) 04/27/2010  . Hyperlipidemia 03/08/2006  . Morbid obesity ([place]) 03/08/2006  . Essential hypertension with morbid obesity 03/08/2006   Past Medical History:  Diagnosis Date  . CHF (congestive heart failure) ([place])   . Chronic kidney disease   . Chronic kidney disease due to diabetes mellitus ([place]) 10/13/2013  . Diabetes mellitus   . Hyperlipidemia   . Hypertension     Family History  Problem Relation Age of Onset  . Heart disease Mother   . Heart disease Father     Past Surgical History:  Procedure Laterality Date  . AMPUTATION Right 02/27/2018   Procedure: RIGHT FOOT 2ND AND 3RD RAY AMPUTATION;  Surgeon: [name], MD;  Location: [facility];  Service: Orthopedics;  Laterality: Right;  . AV FISTULA PLACEMENT Left 04/15/2015   Procedure: ARTERIOVENOUS (AV) FISTULA CREATION;  Surgeon: Mal Misty, MD;  Location: [facility];  Service: Vascular;  Laterality: Left;  . EYE SURGERY Right    retinal detachment  . FISTULA SUPERFICIALIZATION Left 06/03/2015   Procedure: LEFT UPPER ARM BRACHIOCEPHALIC FISTULA SUPERFICIALIZATION;  Surgeon: Mal Misty, MD;  Location: [facility];  Service: Vascular;  Laterality: Left;  from MAC to General   Social History   Occupational History  . Not on file   Tobacco Use  . Smoking status: Never Smoker  . Smokeless tobacco: Never Used  Substance and Sexual Activity  . Alcohol use: No    Alcohol/week: 0.0 standard drinks  . Drug use: No  . Sexual activity: Never

## 2018-04-22 ENCOUNTER — Telehealth: Payer: Self-pay

## 2018-04-22 ENCOUNTER — Telehealth: Payer: Self-pay | Admitting: Cardiology

## 2018-04-22 ENCOUNTER — Ambulatory Visit (INDEPENDENT_AMBULATORY_CARE_PROVIDER_SITE_OTHER): Payer: Self-pay | Admitting: Physician Assistant

## 2018-04-22 NOTE — Telephone Encounter (Addendum)
Virtual Visit Pre-Appointment Phone Call  Steps For Call:  1. Confirm consent - "In the setting of the current Covid19 crisis, you are scheduled for a PHONE (patient has a smart phone but is unsure of how to use it ) visit with your provider on 04/24/2018 at 3:00PM.  Just as we do with many in-office visits, in order for you to participate in this visit, we must obtain consent.  If you'd like, I can send this to your mychart (if signed up) or email for you to review.  Otherwise, I can obtain your verbal consent now.  All virtual visits are billed to your insurance company just like a normal visit would be.  By agreeing to a virtual visit, we'd like you to understand that the technology does not allow for your provider to perform an examination, and thus may limit your provider's ability to fully assess your condition.  Finally, though the technology is pretty good, we cannot assure that it will always work on either your or our end, and in the setting of a video visit, we may have to convert it to a phone-only visit.  In either situation, we cannot ensure that we have a secure connection.  Are you willing to proceed?"  2. Give patient instructions for WebEx download to smartphone as below if video visit  3. Advise patient to be prepared with any vital sign or heart rhythm information, their current medicines, and a piece of paper and pen handy for any instructions they may receive the day of their visit  4. Inform patient they will receive a phone call 15 minutes prior to their appointment time (may be from unknown caller ID) so they should be prepared to answer  5. Confirm that appointment type is correct in Epic appointment notes (video vs telephone)    TELEPHONE CALL NOTE  Rachel Chaney has been deemed a candidate for a follow-up tele-health visit to limit community exposure during the Covid-19 pandemic. I spoke with the patient via phone to ensure availability of phone/video source,  confirm preferred email & phone number, and discuss instructions and expectations.  I reminded Rachel Chaney to be prepared with any vital sign and/or heart rhythm information that could potentially be obtained via home monitoring, at the time of her visit. I reminded Rachel Chaney to expect a phone call at the time of her visit if her visit.  Did the patient verbally acknowledge consent to treatment? YES  Rachel Chaney, CMA 04/22/2018 3:22 PM   DOWNLOADING THE [place]  - If Apple, go to [employer] and type in WebEx in the search bar. [place] [employer], the blue/green circle. The app is free but as with any other app downloads, their phone may require them to verify saved payment information or Apple password. The patient does NOT have to create an account.  - If Android, ask patient to go to [employer] and type in WebEx in the search bar. [place] [employer], the blue/green circle. The app is free but as with any other app downloads, their phone may require them to verify saved payment information or Android password. The patient does NOT have to create an account.   CONSENT FOR TELE-HEALTH VISIT - PLEASE REVIEW  I hereby voluntarily request, consent and authorize [facility] and its employed or contracted physicians, physician assistants, nurse practitioners or other licensed health care professionals (the Practitioner), to provide me with telemedicine  health care services (the "Services") as deemed necessary by the treating Practitioner. I acknowledge and consent to receive the Services by the Practitioner via telemedicine. I understand that the telemedicine visit will involve communicating with the Practitioner through live audiovisual communication technology and the disclosure of certain medical information by electronic transmission. I acknowledge that I have been given the opportunity to request an in-person assessment or other  available alternative prior to the telemedicine visit and am voluntarily participating in the telemedicine visit.  I understand that I have the right to withhold or withdraw my consent to the use of telemedicine in the course of my care at any time, without affecting my right to future care or treatment, and that the Practitioner or I may terminate the telemedicine visit at any time. I understand that I have the right to inspect all information obtained and/or recorded in the course of the telemedicine visit and may receive copies of available information for a reasonable fee.  I understand that some of the potential risks of receiving the Services via telemedicine include:  Marland Kitchen Delay or interruption in medical evaluation due to technological equipment failure or disruption; . Information transmitted may not be sufficient (e.g. poor resolution of images) to allow for appropriate medical decision making by the Practitioner; and/or  . In rare instances, security protocols could fail, causing a breach of personal health information.  Furthermore, I acknowledge that it is my responsibility to provide information about my medical history, conditions and care that is complete and accurate to the best of my ability. I acknowledge that Practitioner's advice, recommendations, and/or decision may be based on factors not within their control, such as incomplete or inaccurate data provided by me or distortions of diagnostic images or specimens that may result from electronic transmissions. I understand that the practice of medicine is not an exact science and that Practitioner makes no warranties or guarantees regarding treatment outcomes. I acknowledge that I will receive a copy of this consent concurrently upon execution via email to the email address I last provided but may also request a printed copy by calling the office of [facility].    I understand that my insurance will be billed for this visit.   I have  read or had this consent read to me. . I understand the contents of this consent, which adequately explains the benefits and risks of the Services being provided via telemedicine.  . I have been provided ample opportunity to ask questions regarding this consent and the Services and have had my questions answered to my satisfaction. . I give my informed consent for the services to be provided through the use of telemedicine in my medical care  By participating in this telemedicine visit I agree to the above.

## 2018-04-22 NOTE — Telephone Encounter (Signed)
I called and sw HHPT to advise verbal ok for orders below. Advised that she is requiring a transmet amputation and that this has not been sch yet but that this is what was discussed at visit last week. Just FYI. They will call with any further questions.

## 2018-04-22 NOTE — Telephone Encounter (Signed)

## 2018-04-22 NOTE — Telephone Encounter (Signed)
Smart phone.Declined MyChart/pre reg completed. 04-22-18 ST Patient consents to phone visit on 04-24-18. ST

## 2018-04-23 ENCOUNTER — Other Ambulatory Visit: Payer: Self-pay

## 2018-04-23 ENCOUNTER — Encounter (HOSPITAL_COMMUNITY): Payer: Self-pay | Admitting: *Deleted

## 2018-04-23 MED ORDER — ALTEPLASE 2 MG IJ SOLR: 2.0000 mg | Freq: Once | INTRAMUSCULAR | Status: DC | PRN

## 2018-04-23 MED ORDER — LIDOCAINE-PRILOCAINE 2.5-2.5 % EX CREA: 1.0000 "application " | TOPICAL_CREAM | CUTANEOUS | Status: DC | PRN

## 2018-04-23 MED ORDER — PENTAFLUOROPROP-TETRAFLUOROETH EX AERO: 1.0000 "application " | INHALATION_SPRAY | CUTANEOUS | Status: DC | PRN

## 2018-04-23 MED ORDER — SODIUM CHLORIDE 0.9 % IV SOLN: 100.0000 mL | INTRAVENOUS | Status: DC | PRN

## 2018-04-23 MED ORDER — LIDOCAINE HCL (PF) 1 % IJ SOLN: 5.0000 mL | INTRAMUSCULAR | Status: DC | PRN

## 2018-04-23 MED ORDER — HEPARIN SODIUM (PORCINE) 1000 UNIT/ML DIALYSIS: 8000.0000 [IU] | INTRAMUSCULAR | Status: DC | PRN

## 2018-04-23 MED ORDER — HEPARIN SODIUM (PORCINE) 1000 UNIT/ML DIALYSIS: 1000.0000 [IU] | INTRAMUSCULAR | Status: DC | PRN

## 2018-04-23 NOTE — Progress Notes (Signed)
Anesthesia Chart Review: SAME DAY WORK-UP   Case:  [id number] Date/Time:  04/24/18 1035   Procedure:  RIGHT TRANSMETATARSAL AMPUTATION, PLACE VAC (Right )   Anesthesia type:  Choice   Pre-op diagnosis:  Dehiscence Right Foot 2nd/3rd Toe Amputation   Location:  [facility] OR ROOM 08 / [facility]   Surgeon:  [name], MD      DISCUSSION: Patient is a 58 year old female scheduled for the above procedure. She is s/p right foot 2nd and 3rd foot ray amputations on 8/67/67 complicated by cardiac arrest (following hemodialysis) on 02/28/18. Details outlined below. Despite wound care and antibiotics, poor wound healing noted at 04/18/18 follow-up with right TMA recommended.   History includes never smoker, ESRD (HD MWF, s/p LUE brachiocephalic AVF 2094), DM2 on insulin, HTN, chronic diastolic CHF. Last BMI is consistent with obesity. She had cardiac arrest on 02/28/18 with unclear etiology, but possibly due to PE for right cephalic vein thrombus (07/17/60 CTA negative for PE) or from volume depletion from HD. Had brief episode of PAF 03/10/18 with no anticoagulation recommended at that time, but with plans for out-patient event monitor. - ED visit 03/29/18 following missed hemodialysis. She had just been discharged from [facility] back to home and apparently family was able to assist her with getting up and to dialysis that day. Arrangements were made for out-patient HD 03/30/18.  - Hospitalization 02/25/18 - 03/14/18 for open right 2nd toe fracture with abscess/osteomyelitis. She underwent right foot 2nd and 3rd foot ray amputations on 02/27/18. She had hemodialysis on 02/28/18 and around 2:30 PM shortly after returning to her room, patient became unresponsive and pulseless requiring CODE BLUE with chest compressions and intubation. No shock advised. Given epinephrine, Bicarb, and Narcan with ROSC (within 7 minutes) and transfer to ICU. LE Duplex negative for any obvious DVT. BUE Duplex negative for DVT, but showed acute  superficial vein thrombosis of the right cephalic vein. CTA of the chest ordered to evaluate for PE, but was delayed due to inability to obtain IV access for contrast. She was treated for presumed PE with Heparin, although 03/07/18 CTA ultimately was negative for PE but with finding of likely BLL pneumonia, and anticoagulation discontinued. She was extubated on 03/01/18. She did have hypercarbic respiratory failure and required BiPAP. Underlying OSA and OHS felt likely contributing and out-patient sleep study recommended. Echo showed normal LVEF without LV wall motion abnormalities. RV had moderately reduced systolic function. Cardiology consulted 03/10/18 for arrest history of 5 minute afib on telemetry. Hypotension limited treatment (requried midodrine). No anticoagulation recommended. Elevated troponins possibly related to CPR. Consider out-patient stress test, although on 03/11/18, Dr. Percival Spanish mentions long term monitoring (2 week event monitor) at home but "Lynnae Prude' think that other cardiac work up is necessarily indicated."  Patient was discharged to [facility] 03/14/18 and has 04/24/18 phone visit with cardiology scheduled for 04/30/18 (rescheduled from 04/24/18).   She denied chest pain and SOB to PAT phone RN. Wednesday is a usual hemodialysis day--reportedly planned admission post-operatively (so would need nephrology consult for dialysis arrangements). She is a same day work-up, so labs and further evaluation by her anesthesia team on the day of surgery.  Lab results and exam findings will help determine if surgery can proceed as scheduled. Reviewed history with anesthesiologist Myrtie Soman, MD.   PROVIDERS: Bufford Lope, DO is PCP ([place]). Last tele encounter 03/28/18 with plan for referral for sleep study, referral to hematology for chronic leukocytosis, and notified  of 04/24/18 cardiology follow-up. - Seen by cardiologists Kirk Ruths, MD and Minus Breeding, MD on 03/10/18 for cardiac arrest  and possible afib during hospitalization. See Discussion for last input. Has televisit scheduled for 04/30/18 with Kerin Ransom, PA-C.   LABS: She is for labs on the day of surgery. Her A1c 03/08/18 was 11.4 (during hospitalization for abscess/osteomyelitis), but now reports home CBGs are running ~ 120-140.    IMAGES: 1V CXR 03/29/18: IMPRESSION: Abnormal pulmonary opacity appearing very similar to the 03/10/2018 portable exam and favored to reflect pulmonary vascular congestion with basilar atelectasis.   EKG: 03/29/18:  Sinus rhythm Borderline left axis deviation Anterior infarct, old No significant change since last tracing Confirmed by Dorie Rank [phone]) on 03/29/2018 8:55:45 AM   CV: Echo 03/08/18: IMPRESSIONS  1. The left ventricle has hyperdynamic systolic function, with an ejection fraction of >65%. The cavity size was normal. There is severely increased left ventricular wall thickness. Left ventricular diastolic Doppler parameters are consistent with  impaired relaxation Indeterminent filling pressures The E/e' is 8-15.  2. The right ventricle has moderately reduced systolic function. The cavity was moderately enlarged. There is no increase in right ventricular wall thickness.  3. Left atrial size was mildly dilated.  4. Right atrial size was moderately dilated.  5. The mitral valve is degenerative. There is mild to moderate mitral annular calcification present. Trivial mitral regurgitation.  6. Moderate tricuspid stenosis.  7. The tricuspid valve is normal in structure.  8. The aortic valve was not well visualized.  9. The aortic root and ascending aorta are normal in size and structure. 10. The inferior vena cava was dilated in size with <50% respiratory variability. 11. The interatrial septum was not well visualized. 12. When compared to the prior study: LVEF >65% on 03/01/2018.   Past Medical History:  Diagnosis Date  . Cardiac arrest ([place]) 02/28/2018   while on  hemodialysis in hospital  . CHF (congestive heart failure) ([place])   . Chronic kidney disease due to diabetes mellitus ([place]) 10/13/2013  . Diabetes mellitus    Type II  . ESRD (end stage renal disease) ([place])    MWF - [facility]  . Hyperlipidemia   . Hypertension     Past Surgical History:  Procedure Laterality Date  . AMPUTATION Right 02/27/2018   Procedure: RIGHT FOOT 2ND AND 3RD RAY AMPUTATION;  Surgeon: [name], MD;  Location: [facility];  Service: Orthopedics;  Laterality: Right;  . AV FISTULA PLACEMENT Left 04/15/2015   Procedure: ARTERIOVENOUS (AV) FISTULA CREATION;  Surgeon: Mal Misty, MD;  Location: [facility];  Service: Vascular;  Laterality: Left;  . EYE SURGERY Right    retinal detachment  . FISTULA SUPERFICIALIZATION Left 06/03/2015   Procedure: LEFT UPPER ARM BRACHIOCEPHALIC FISTULA SUPERFICIALIZATION;  Surgeon: Mal Misty, MD;  Location: [facility];  Service: Vascular;  Laterality: Left;  from MAC to General    MEDICATIONS: . 0.9 %  sodium chloride infusion  . 0.9 %  sodium chloride infusion  . alteplase (CATHFLO ACTIVASE) injection 2 mg  . heparin injection 1,000 Units  . heparin injection 8,000 Units  . lidocaine (PF) (XYLOCAINE) 1 % injection 5 mL  . lidocaine-prilocaine (EMLA) cream 1 application  . pentafluoroprop-tetrafluoroeth (GEBAUERS) aerosol 1 application   . acetaminophen (TYLENOL) 500 MG tablet  . atorvastatin (LIPITOR) 80 MG tablet  . carvedilol (COREG) 25 MG tablet  . cetirizine (ZYRTEC) 10 MG tablet  . ferric citrate (AURYXIA) 1 GM 210 MG(Fe)  tablet  . fluticasone (FLONASE) 50 MCG/ACT nasal spray  . hydrALAZINE (APRESOLINE) 50 MG tablet  . hydrOXYzine (ATARAX/VISTARIL) 50 MG tablet  . Insulin Detemir (LEVEMIR FLEXTOUCH) 100 UNIT/ML Pen  . silver sulfADIAZINE (SILVADENE) 1 % cream  . Amino Acids-Protein Hydrolys (FEEDING SUPPLEMENT, PRO-STAT SUGAR FREE 64,) LIQD  . Blood Glucose Monitoring Suppl (ONE TOUCH ULTRA 2) w/Device KIT  .  cyclobenzaprine (FLEXERIL) 10 MG tablet  . diclofenac sodium (VOLTAREN) 1 % GEL  . doxycycline (VIBRAMYCIN) 100 MG capsule  . glucose blood (ONE TOUCH ULTRA TEST) test strip  . glucose monitoring kit (FREESTYLE) monitoring kit  . Insulin Pen Needle (B-D UF III MINI PEN NEEDLES) 31G X 5 MM MISC  . Insulin Pen Needle 29G X 12MM MISC  . Insulin Syringe-Needle U-100 (ULTICARE INSULIN SYRINGE) 30G X 5/16" 1 ML MISC  . midodrine (PROAMATINE) 10 MG tablet  . OneTouch Delica Lancets 46F MISC  . pantoprazole (PROTONIX) 40 MG tablet  . pentoxifylline (TRENTAL) 400 MG CR tablet    Myra Gianotti, PA-C Surgical Short Stay/Anesthesiology [facility] Phone [phone] [facility] Phone [phone] 04/23/2018 2:54 PM

## 2018-04-23 NOTE — Anesthesia Preprocedure Evaluation (Addendum)
Anesthesia Evaluation  Patient identified by MRN, date of birth, ID band Patient awake    Reviewed: Allergy & Precautions, NPO status , Patient's Chart, lab work & pertinent test results  Airway Mallampati: II  TM Distance: >3 FB Neck ROM: Full    Dental no notable dental hx.    Pulmonary neg pulmonary ROS,    Pulmonary exam normal breath sounds clear to auscultation       Cardiovascular hypertension, +CHF  Normal cardiovascular exam+ Valvular Problems/Murmurs  Rhythm:Regular Rate:Normal     Neuro/Psych negative neurological ROS  negative psych ROS   GI/Hepatic Neg liver ROS, GERD  ,  Endo/Other  diabetes  Renal/GU DialysisRenal disease  negative genitourinary   Musculoskeletal negative musculoskeletal ROS (+)   Abdominal   Peds negative pediatric ROS (+)  Hematology negative hematology ROS (+)   Anesthesia Other Findings   Reproductive/Obstetrics negative OB ROS                            Anesthesia Physical Anesthesia Plan  ASA: III  Anesthesia Plan: MAC   Post-op Pain Management:  Regional for Post-op pain   Induction: Intravenous  PONV Risk Score and Plan: 2 and Treatment may vary due to age or medical condition and Ondansetron  Airway Management Planned: Simple Face Mask  Additional Equipment:   Intra-op Plan:   Post-operative Plan: Extubation in OR  Informed Consent: I have reviewed the patients History and Physical, chart, labs and discussed the procedure including the risks, benefits and alternatives for the proposed anesthesia with the patient or authorized representative who has indicated his/her understanding and acceptance.     Dental advisory given  Plan Discussed with: CRNA and Surgeon  Anesthesia Plan Comments: (Same day work-up. S/p toe amputations 02/27/18, s/p pulseless arrest 02/28/18 after hemodialysis. See PAT note written 04/23/2018 by Myra Gianotti, PA-C.  )       Anesthesia Quick Evaluation

## 2018-04-23 NOTE — Progress Notes (Addendum)
Patient denies that she or her family has experienced any of the following: Cough Fever >100.4 Runny Nose Sore Throat Difficulty breathing/ shortness of breath Travel in past 14 days- none  PCP is Dr Shawna Orleans with [facility].  Mrs Gravatt denies chest pain or shortness of breath.  Patient does not recall what happened 02/28/2018- when she arrested while on dialyses 1 day post op.  Patient went to [facility] for Rehab and is now at home.  Mrs Wolz has Type II diabetes > Patient reports thjat CBGs have been running 120-140- she said that she has not had A1C checked since 02/2018.  I instructed patient to take 1/2 of scheduled Insulin tonight- 20 units Levemir. I instructed patient to check CBG when she gets u p in am and if it is > 70 to take 1/2 dose of Levemir- 25 units. I instructed patient to check CBG after awaking and every 2 hours until arrival  to the hospital.  I Instructed patient if CBG is less than 70 to drink 1/2 cup of a clear juice. Recheck CBG in 15 minutes then call pre- op desk at [phone] for further instructions. If scheduled to receive Insulin, do not take Insulin. Mrs Holtrop is scheduled for hemodialysis tomorrow, she is not aware of  the plan.  I will call Malachy Mood to check. I left a voice messaghe for CHeryl.   Mrs Leu has not had a cardiologist prior to 02/2018, she is scheduled to have a phone appointment at 1500 02/24/2008, she asked that I call and inform them that she will be in the hospital, "I do not have the number."  I told patient that I would notify [facility]. Patient's call was rescheduled to 04/30/2018.

## 2018-04-24 ENCOUNTER — Encounter (HOSPITAL_COMMUNITY)
Admission: RE | Disposition: A | Discharge status: Home / Self Care | Payer: Self-pay | Source: Home / Self Care | Attending: Orthopedic Surgery

## 2018-04-24 ENCOUNTER — Telehealth: Payer: Medicare Other | Admitting: Cardiology

## 2018-04-24 ENCOUNTER — Inpatient Hospital Stay (HOSPITAL_COMMUNITY): Payer: Medicare Other | Admitting: Vascular Surgery

## 2018-04-24 ENCOUNTER — Encounter (HOSPITAL_COMMUNITY): Payer: Self-pay

## 2018-04-24 ENCOUNTER — Inpatient Hospital Stay (HOSPITAL_COMMUNITY)
Admission: RE | Admit: 2018-04-24 | Discharge: 2018-04-26 | DRG: 907 | Disposition: A | Discharge status: Home / Self Care | Payer: Medicare Other | Attending: Orthopedic Surgery | Admitting: Orthopedic Surgery

## 2018-04-24 DIAGNOSIS — T8131XA Disruption of external operation (surgical) wound, not elsewhere classified, initial encounter: Principal | ICD-10-CM | POA: Diagnosis present

## 2018-04-24 DIAGNOSIS — T8130XA Disruption of wound, unspecified, initial encounter: Secondary | ICD-10-CM

## 2018-04-24 DIAGNOSIS — Z9981 Dependence on supplemental oxygen: Secondary | ICD-10-CM

## 2018-04-24 DIAGNOSIS — Z8674 Personal history of sudden cardiac arrest: Secondary | ICD-10-CM

## 2018-04-24 DIAGNOSIS — Z794 Long term (current) use of insulin: Secondary | ICD-10-CM

## 2018-04-24 DIAGNOSIS — T8131XS Disruption of external operation (surgical) wound, not elsewhere classified, sequela: Secondary | ICD-10-CM

## 2018-04-24 DIAGNOSIS — Z992 Dependence on renal dialysis: Secondary | ICD-10-CM

## 2018-04-24 DIAGNOSIS — Z79899 Other long term (current) drug therapy: Secondary | ICD-10-CM

## 2018-04-24 DIAGNOSIS — E8889 Other specified metabolic disorders: Secondary | ICD-10-CM | POA: Diagnosis present

## 2018-04-24 DIAGNOSIS — Z7951 Long term (current) use of inhaled steroids: Secondary | ICD-10-CM

## 2018-04-24 DIAGNOSIS — E1122 Type 2 diabetes mellitus with diabetic chronic kidney disease: Secondary | ICD-10-CM | POA: Diagnosis present

## 2018-04-24 DIAGNOSIS — Z89431 Acquired absence of right foot: Secondary | ICD-10-CM

## 2018-04-24 DIAGNOSIS — N2581 Secondary hyperparathyroidism of renal origin: Secondary | ICD-10-CM | POA: Diagnosis present

## 2018-04-24 DIAGNOSIS — N186 End stage renal disease: Secondary | ICD-10-CM | POA: Diagnosis present

## 2018-04-24 DIAGNOSIS — I120 Hypertensive chronic kidney disease with stage 5 chronic kidney disease or end stage renal disease: Secondary | ICD-10-CM | POA: Diagnosis present

## 2018-04-24 DIAGNOSIS — Z8249 Family history of ischemic heart disease and other diseases of the circulatory system: Secondary | ICD-10-CM

## 2018-04-24 DIAGNOSIS — E785 Hyperlipidemia, unspecified: Secondary | ICD-10-CM | POA: Diagnosis present

## 2018-04-24 HISTORY — DX: End stage renal disease: N18.6

## 2018-04-24 HISTORY — DX: Cardiac arrest, cause unspecified: I46.9

## 2018-04-24 HISTORY — PX: AMPUTATION: SHX166

## 2018-04-24 LAB — GLUCOSE, CAPILLARY
Glucose-Capillary: 69 mg/dL — ABNORMAL LOW (ref 70–99)
Glucose-Capillary: 106 mg/dL — ABNORMAL HIGH (ref 70–99)
Glucose-Capillary: 103 mg/dL — ABNORMAL HIGH (ref 70–99)
Glucose-Capillary: 62 mg/dL — ABNORMAL LOW (ref 70–99)
Glucose-Capillary: 80 mg/dL (ref 70–99)
Glucose-Capillary: 148 mg/dL — ABNORMAL HIGH (ref 70–99)
Glucose-Capillary: 211 mg/dL — ABNORMAL HIGH (ref 70–99)

## 2018-04-24 LAB — POCT I-STAT 4, (NA,K, GLUC, HGB,HCT)
Glucose, Bld: 65 mg/dL — ABNORMAL LOW (ref 70–99)
HCT: 33 % — ABNORMAL LOW (ref 36.0–46.0)
Hemoglobin: 11.2 g/dL — ABNORMAL LOW (ref 12.0–15.0)
Potassium: 3.9 mmol/L (ref 3.5–5.1)
Sodium: 141 mmol/L (ref 135–145)

## 2018-04-24 SURGERY — AMPUTATION, FOOT, PARTIAL
Anesthesia: Monitor Anesthesia Care | Site: Foot | Laterality: Right

## 2018-04-24 MED ORDER — FENTANYL CITRATE (PF) 100 MCG/2ML IJ SOLN
INTRAMUSCULAR | Status: AC
  Administered 2018-04-24: 50 ug via INTRAVENOUS
  Filled 2018-04-24: qty 2

## 2018-04-24 MED ORDER — BISACODYL 10 MG RE SUPP: 10.0000 mg | Freq: Every day | RECTAL | Status: DC | PRN

## 2018-04-24 MED ORDER — PROPOFOL 10 MG/ML IV BOLUS
INTRAVENOUS | Status: DC | PRN
  Administered 2018-04-24: 15 mg via INTRAVENOUS
  Administered 2018-04-24: 25 mg via INTRAVENOUS

## 2018-04-24 MED ORDER — DEXTROSE 50 % IV SOLN
25.0000 mL | Freq: Once | INTRAVENOUS | Status: AC
  Administered 2018-04-24: 25 mL via INTRAVENOUS

## 2018-04-24 MED ORDER — DOCUSATE SODIUM 100 MG PO CAPS
100.0000 mg | ORAL_CAPSULE | Freq: Two times a day (BID) | ORAL | Status: DC
  Administered 2018-04-24 – 2018-04-26 (×4): 100 mg via ORAL
  Filled 2018-04-24 (×4): qty 1

## 2018-04-24 MED ORDER — OXYCODONE HCL 5 MG PO TABS
10.0000 mg | ORAL_TABLET | ORAL | Status: DC | PRN
  Filled 2018-04-24 (×2): qty 3

## 2018-04-24 MED ORDER — EPHEDRINE SULFATE 50 MG/ML IJ SOLN
INTRAMUSCULAR | Status: DC | PRN
  Administered 2018-04-24: 10 mg via INTRAVENOUS

## 2018-04-24 MED ORDER — METHOCARBAMOL 1000 MG/10ML IJ SOLN
500.0000 mg | Freq: Four times a day (QID) | INTRAVENOUS | Status: DC | PRN
  Filled 2018-04-24: qty 5

## 2018-04-24 MED ORDER — ONDANSETRON HCL 4 MG PO TABS: 4.0000 mg | ORAL_TABLET | Freq: Four times a day (QID) | ORAL | Status: DC | PRN

## 2018-04-24 MED ORDER — DEXAMETHASONE SODIUM PHOSPHATE 4 MG/ML IJ SOLN
INTRAMUSCULAR | Status: DC | PRN
  Administered 2018-04-24: 5 mg via INTRAVENOUS

## 2018-04-24 MED ORDER — ONDANSETRON HCL 4 MG/2ML IJ SOLN
4.0000 mg | Freq: Four times a day (QID) | INTRAMUSCULAR | Status: DC | PRN
  Administered 2018-04-25: 4 mg via INTRAVENOUS
  Filled 2018-04-24: qty 2

## 2018-04-24 MED ORDER — HYDRALAZINE HCL 50 MG PO TABS
50.0000 mg | ORAL_TABLET | Freq: Three times a day (TID) | ORAL | Status: DC
  Administered 2018-04-24 – 2018-04-25 (×4): 50 mg via ORAL
  Filled 2018-04-24 (×5): qty 1

## 2018-04-24 MED ORDER — HYDROXYZINE HCL 25 MG PO TABS: 50.0000 mg | ORAL_TABLET | Freq: Two times a day (BID) | ORAL | Status: DC | PRN

## 2018-04-24 MED ORDER — METOCLOPRAMIDE HCL 5 MG/ML IJ SOLN: 5.0000 mg | Freq: Three times a day (TID) | INTRAMUSCULAR | Status: DC | PRN

## 2018-04-24 MED ORDER — LIDOCAINE-EPINEPHRINE (PF) 1.5 %-1:200000 IJ SOLN
INTRAMUSCULAR | Status: DC | PRN
  Administered 2018-04-24: 10 mL via PERINEURAL

## 2018-04-24 MED ORDER — PROPOFOL 10 MG/ML IV BOLUS
INTRAVENOUS | Status: AC
  Filled 2018-04-24: qty 20

## 2018-04-24 MED ORDER — ROPIVACAINE HCL 5 MG/ML IJ SOLN
INTRAMUSCULAR | Status: DC | PRN
  Administered 2018-04-24: 25 mL via PERINEURAL

## 2018-04-24 MED ORDER — INSULIN ASPART 100 UNIT/ML ~~LOC~~ SOLN
4.0000 [IU] | Freq: Three times a day (TID) | SUBCUTANEOUS | Status: DC
  Administered 2018-04-24 – 2018-04-25 (×3): 4 [IU] via SUBCUTANEOUS

## 2018-04-24 MED ORDER — FENTANYL CITRATE (PF) 100 MCG/2ML IJ SOLN
50.0000 ug | Freq: Once | INTRAMUSCULAR | Status: AC
  Administered 2018-04-24: 50 ug via INTRAVENOUS

## 2018-04-24 MED ORDER — ONDANSETRON HCL 4 MG/2ML IJ SOLN
INTRAMUSCULAR | Status: DC | PRN
  Administered 2018-04-24: 4 mg via INTRAVENOUS

## 2018-04-24 MED ORDER — PHENYLEPHRINE 40 MCG/ML (10ML) SYRINGE FOR IV PUSH (FOR BLOOD PRESSURE SUPPORT)
PREFILLED_SYRINGE | INTRAVENOUS | Status: AC
  Filled 2018-04-24: qty 10

## 2018-04-24 MED ORDER — CEFAZOLIN SODIUM-DEXTROSE 2-4 GM/100ML-% IV SOLN
2.0000 g | Freq: Once | INTRAVENOUS | Status: AC
  Administered 2018-04-25: 2 g via INTRAVENOUS
  Filled 2018-04-24: qty 100

## 2018-04-24 MED ORDER — MIDAZOLAM HCL 2 MG/2ML IJ SOLN
INTRAMUSCULAR | Status: AC
  Administered 2018-04-24: 1 mg via INTRAVENOUS
  Filled 2018-04-24: qty 2

## 2018-04-24 MED ORDER — DEXTROSE 50 % IV SOLN
25.0000 mL | Freq: Once | INTRAVENOUS | Status: AC
  Administered 2018-04-24: 25 mL via INTRAVENOUS
  Filled 2018-04-24: qty 50

## 2018-04-24 MED ORDER — SODIUM CHLORIDE 0.9 % IV SOLN
INTRAVENOUS | Status: DC
  Administered 2018-04-24: 17:00:00 via INTRAVENOUS

## 2018-04-24 MED ORDER — CARVEDILOL 25 MG PO TABS
25.0000 mg | ORAL_TABLET | Freq: Two times a day (BID) | ORAL | Status: DC
  Administered 2018-04-24 – 2018-04-25 (×3): 25 mg via ORAL
  Filled 2018-04-24 (×3): qty 1

## 2018-04-24 MED ORDER — SODIUM CHLORIDE 0.9 % IV SOLN
INTRAVENOUS | Status: DC
  Administered 2018-04-24: 08:00:00 via INTRAVENOUS

## 2018-04-24 MED ORDER — FENTANYL CITRATE (PF) 100 MCG/2ML IJ SOLN: 25.0000 ug | INTRAMUSCULAR | Status: DC | PRN

## 2018-04-24 MED ORDER — HYDROMORPHONE HCL 1 MG/ML IJ SOLN: 0.5000 mg | INTRAMUSCULAR | Status: DC | PRN

## 2018-04-24 MED ORDER — METOCLOPRAMIDE HCL 5 MG PO TABS: 5.0000 mg | ORAL_TABLET | Freq: Three times a day (TID) | ORAL | Status: DC | PRN

## 2018-04-24 MED ORDER — OXYCODONE HCL 5 MG PO TABS
5.0000 mg | ORAL_TABLET | ORAL | Status: DC | PRN
  Administered 2018-04-24 – 2018-04-25 (×2): 5 mg via ORAL
  Administered 2018-04-25: 10 mg via ORAL
  Filled 2018-04-24: qty 2
  Filled 2018-04-24: qty 1

## 2018-04-24 MED ORDER — DEXAMETHASONE SODIUM PHOSPHATE 10 MG/ML IJ SOLN
INTRAMUSCULAR | Status: AC
  Filled 2018-04-24: qty 1

## 2018-04-24 MED ORDER — CHLORHEXIDINE GLUCONATE 4 % EX LIQD
60.0000 mL | Freq: Once | CUTANEOUS | Status: DC
  Administered 2018-04-24: 4 via TOPICAL

## 2018-04-24 MED ORDER — LIDOCAINE 2% (20 MG/ML) 5 ML SYRINGE
INTRAMUSCULAR | Status: AC
  Filled 2018-04-24: qty 5

## 2018-04-24 MED ORDER — INSULIN ASPART 100 UNIT/ML ~~LOC~~ SOLN
0.0000 [IU] | Freq: Three times a day (TID) | SUBCUTANEOUS | Status: DC
  Administered 2018-04-24: 2 [IU] via SUBCUTANEOUS
  Administered 2018-04-25: 5 [IU] via SUBCUTANEOUS
  Administered 2018-04-25: 3 [IU] via SUBCUTANEOUS

## 2018-04-24 MED ORDER — CEFAZOLIN SODIUM-DEXTROSE 2-4 GM/100ML-% IV SOLN
2.0000 g | INTRAVENOUS | Status: AC
  Administered 2018-04-24: 2 g via INTRAVENOUS
  Filled 2018-04-24: qty 100

## 2018-04-24 MED ORDER — FERRIC CITRATE 1 GM 210 MG(FE) PO TABS
420.0000 mg | ORAL_TABLET | Freq: Three times a day (TID) | ORAL | Status: DC
  Administered 2018-04-24 – 2018-04-26 (×4): 420 mg via ORAL
  Filled 2018-04-24 (×8): qty 2

## 2018-04-24 MED ORDER — MIDAZOLAM HCL 2 MG/2ML IJ SOLN
1.0000 mg | Freq: Once | INTRAMUSCULAR | Status: AC
  Administered 2018-04-24: 1 mg via INTRAVENOUS

## 2018-04-24 MED ORDER — PROPOFOL 500 MG/50ML IV EMUL
INTRAVENOUS | Status: DC | PRN
  Administered 2018-04-24: 75 ug/kg/min via INTRAVENOUS

## 2018-04-24 MED ORDER — ACETAMINOPHEN 325 MG PO TABS: 325.0000 mg | ORAL_TABLET | Freq: Four times a day (QID) | ORAL | Status: DC | PRN

## 2018-04-24 MED ORDER — FENTANYL CITRATE (PF) 250 MCG/5ML IJ SOLN
INTRAMUSCULAR | Status: AC
  Filled 2018-04-24: qty 5

## 2018-04-24 MED ORDER — INSULIN DETEMIR 100 UNIT/ML ~~LOC~~ SOLN
30.0000 [IU] | Freq: Two times a day (BID) | SUBCUTANEOUS | Status: DC
  Administered 2018-04-24 – 2018-04-25 (×3): 30 [IU] via SUBCUTANEOUS
  Filled 2018-04-24 (×7): qty 0.3

## 2018-04-24 MED ORDER — ASPIRIN EC 325 MG PO TBEC
325.0000 mg | DELAYED_RELEASE_TABLET | Freq: Every day | ORAL | Status: DC
  Administered 2018-04-24 – 2018-04-26 (×3): 325 mg via ORAL
  Filled 2018-04-24 (×3): qty 1

## 2018-04-24 MED ORDER — PHENYLEPHRINE HCL (PRESSORS) 10 MG/ML IV SOLN
INTRAVENOUS | Status: DC | PRN
  Administered 2018-04-24: 160 ug via INTRAVENOUS
  Administered 2018-04-24 (×2): 80 ug via INTRAVENOUS
  Administered 2018-04-24: 120 ug via INTRAVENOUS
  Administered 2018-04-24: 80 ug via INTRAVENOUS

## 2018-04-24 MED ORDER — METHOCARBAMOL 500 MG PO TABS
500.0000 mg | ORAL_TABLET | Freq: Four times a day (QID) | ORAL | Status: DC | PRN
  Administered 2018-04-25: 500 mg via ORAL
  Filled 2018-04-24 (×2): qty 1

## 2018-04-24 MED ORDER — PROMETHAZINE HCL 25 MG/ML IJ SOLN: 6.2500 mg | INTRAMUSCULAR | Status: DC | PRN

## 2018-04-24 MED ORDER — 0.9 % SODIUM CHLORIDE (POUR BTL) OPTIME
TOPICAL | Status: DC | PRN
  Administered 2018-04-24: 1000 mL

## 2018-04-24 MED ORDER — ATORVASTATIN CALCIUM 80 MG PO TABS
80.0000 mg | ORAL_TABLET | Freq: Every day | ORAL | Status: DC
  Administered 2018-04-25 – 2018-04-26 (×2): 80 mg via ORAL
  Filled 2018-04-24 (×2): qty 1

## 2018-04-24 MED ORDER — ONDANSETRON HCL 4 MG/2ML IJ SOLN
INTRAMUSCULAR | Status: AC
  Filled 2018-04-24: qty 2

## 2018-04-24 MED ORDER — POLYETHYLENE GLYCOL 3350 17 G PO PACK: 17.0000 g | PACK | Freq: Every day | ORAL | Status: DC | PRN

## 2018-04-24 MED ORDER — MAGNESIUM CITRATE PO SOLN: 1.0000 | Freq: Once | ORAL | Status: DC | PRN

## 2018-04-24 MED ORDER — LIDOCAINE HCL (CARDIAC) PF 100 MG/5ML IV SOSY
PREFILLED_SYRINGE | INTRAVENOUS | Status: DC | PRN
  Administered 2018-04-24: 100 mg via INTRAVENOUS

## 2018-04-24 SURGICAL SUPPLY — 39 items
APL PRP STRL LF DISP 70% ISPRP (MISCELLANEOUS) ×1
APL SKNCLS STERI-STRIP NONHPOA (GAUZE/BANDAGES/DRESSINGS) ×1
BENZOIN TINCTURE PRP APPL 2/3 (GAUZE/BANDAGES/DRESSINGS) ×3 IMPLANT
BLADE SAW SGTL HD 18.5X60.5X1. (BLADE) ×3 IMPLANT
BLADE SURG 21 STRL SS (BLADE) ×3 IMPLANT
BNDG COHESIVE 4X5 TAN STRL (GAUZE/BANDAGES/DRESSINGS) ×3 IMPLANT
BNDG GAUZE ELAST 4 BULKY (GAUZE/BANDAGES/DRESSINGS) IMPLANT
CANISTER WOUND CARE 500ML ATS (WOUND CARE) ×3 IMPLANT
CHLORAPREP W/TINT 26 (MISCELLANEOUS) ×3 IMPLANT
COVER SURGICAL LIGHT HANDLE (MISCELLANEOUS) ×3 IMPLANT
COVER WAND RF STERILE (DRAPES) IMPLANT
DRAPE INCISE IOBAN 66X45 STRL (DRAPES) ×3 IMPLANT
DRAPE U-SHAPE 47X51 STRL (DRAPES) ×3 IMPLANT
DRSG ADAPTIC 3X8 NADH LF (GAUZE/BANDAGES/DRESSINGS) IMPLANT
DRSG PAD ABDOMINAL 8X10 ST (GAUZE/BANDAGES/DRESSINGS) IMPLANT
DURAPREP 26ML APPLICATOR (WOUND CARE) IMPLANT
ELECT REM PT RETURN 9FT ADLT (ELECTROSURGICAL) ×3
ELECTRODE REM PT RTRN 9FT ADLT (ELECTROSURGICAL) ×1 IMPLANT
GAUZE SPONGE 4X4 12PLY STRL (GAUZE/BANDAGES/DRESSINGS) IMPLANT
GLOVE BIOGEL PI IND STRL 9 (GLOVE) ×1 IMPLANT
GLOVE BIOGEL PI INDICATOR 9 (GLOVE) ×2
GLOVE SURG ORTHO 9.0 STRL STRW (GLOVE) ×3 IMPLANT
GOWN STRL REUS W/ TWL XL LVL3 (GOWN DISPOSABLE) ×3 IMPLANT
GOWN STRL REUS W/TWL XL LVL3 (GOWN DISPOSABLE) ×6
KIT BASIN OR (CUSTOM PROCEDURE TRAY) ×3 IMPLANT
KIT DRSG PREVENA PLUS 7DAY 125 (MISCELLANEOUS) ×3 IMPLANT
KIT PREVENA INCISION MGT 13 (CANNISTER) ×3 IMPLANT
KIT TURNOVER KIT B (KITS) ×3 IMPLANT
NS IRRIG 1000ML POUR BTL (IV SOLUTION) ×3 IMPLANT
PACK ORTHO EXTREMITY (CUSTOM PROCEDURE TRAY) ×3 IMPLANT
PAD ARMBOARD 7.5X6 YLW CONV (MISCELLANEOUS) ×3 IMPLANT
SPONGE LAP 18X18 RF (DISPOSABLE) IMPLANT
SUT ETHILON 2 0 PSLX (SUTURE) ×6 IMPLANT
TOWEL OR 17X24 6PK STRL BLUE (TOWEL DISPOSABLE) ×3 IMPLANT
TOWEL OR 17X26 10 PK STRL BLUE (TOWEL DISPOSABLE) ×3 IMPLANT
TUBE CONNECTING 12'X1/4 (SUCTIONS) ×1
TUBE CONNECTING 12X1/4 (SUCTIONS) ×2 IMPLANT
WATER STERILE IRR 1000ML POUR (IV SOLUTION) ×3 IMPLANT
YANKAUER SUCT BULB TIP NO VENT (SUCTIONS) ×3 IMPLANT

## 2018-04-24 NOTE — Progress Notes (Signed)
Orthopedic Tech Progress Note Patient Details:  Rachel Chaney [birth year] [id number]  Ortho Devices Type of Ortho Device: Postop shoe/boot Ortho Device/Splint Location: LRE Ortho Device/Splint Interventions: Adjustment, Application, Ordered   Post Interventions Patient Tolerated: Well Instructions Provided: Care of device, Adjustment of device   [name] 04/24/2018, 2:48 PM

## 2018-04-24 NOTE — H&P (Signed)
Rachel Chaney is an 58 y.o. female.   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 HPI: Patient is a 58 year old woman who had a cardiac event after her initial toe amputations.  Patient required prolonged intubation ventilator dependent for a couple of weeks.  Patient is still oxygen dependent.  Patient has been home with dressing changes and dialysis Monday Wednesday Friday.  Past Medical History:  Diagnosis Date  . Cardiac arrest ([place]) 02/28/2018   while on hemodialysis in hospital  . CHF (congestive heart failure) ([place])   . Chronic kidney disease due to diabetes mellitus ([place]) 10/13/2013  . Diabetes mellitus    Type II  . ESRD (end stage renal disease) ([place])    MWF - [facility]  . Hyperlipidemia   . Hypertension     Past Surgical History:  Procedure Laterality Date  . AMPUTATION Right 02/27/2018   Procedure: RIGHT FOOT 2ND AND 3RD RAY AMPUTATION;  Surgeon: [name], MD;  Location: [place];  Service: Orthopedics;  Laterality: Right;  . AV FISTULA PLACEMENT Left 04/15/2015   Procedure: ARTERIOVENOUS (AV) FISTULA CREATION;  Surgeon: Mal Misty, MD;  Location: [place];  Service: Vascular;  Laterality: Left;  . EYE SURGERY Right    retinal detachment  . FISTULA SUPERFICIALIZATION Left 06/03/2015   Procedure: LEFT UPPER ARM BRACHIOCEPHALIC FISTULA SUPERFICIALIZATION;  Surgeon: Mal Misty, MD;  Location: [place];  Service: Vascular;  Laterality: Left;  from MAC to General    Family History  Problem Relation Age of Onset  . Heart disease Mother   . Heart disease Father    Social History:  reports that she has never smoked. She has never used smokeless tobacco. She reports that she does not drink alcohol or use drugs.  Allergies: No Known Allergies  Medications Prior to Admission  Medication Sig Dispense Refill  . acetaminophen (TYLENOL) 500 MG tablet Take 1,000 mg by mouth every 6 (six) hours as needed for pain.     Marland Kitchen atorvastatin (LIPITOR) 80 MG tablet Take 1 tablet (80 mg total) by mouth daily. (Patient taking differently: Take 80 mg by mouth at bedtime. ) 30 tablet 0  . carvedilol (COREG) 25 MG tablet Take 25 mg by mouth 2 (two) times daily.    . cetirizine (ZYRTEC) 10 MG tablet Take 10 mg by mouth daily as needed for allergies.    . ferric citrate (AURYXIA) 1 GM 210 MG(Fe) tablet Take 2 tablets (420 mg total) by mouth 3 (three) times daily with meals. Give two (420 mg) by mouth three times a day wih meals for ESRD (Patient taking differently: Take 210 mg by mouth See admin instructions. Take 210 mg with each meal and large snacks) 180 tablet 0  . fluticasone (FLONASE) 50 MCG/ACT nasal spray Place 1 spray into both nostrils daily. 1 spray in each nostril every day (Patient taking differently: Place 1 spray into both nostrils daily as needed for allergies. ) 1 g 0  . hydrALAZINE (APRESOLINE) 50 MG tablet Take 50 mg by mouth 3 (three) times daily.    . hydrOXYzine (ATARAX/VISTARIL) 50 MG tablet Take 1 tablet (50 mg total) by mouth 2 (two) times daily as needed for itching. 30 tablet 0  . Insulin Detemir (LEVEMIR FLEXTOUCH) 100 UNIT/ML Pen Inject 13 Units into the skin 2 (two) times daily for 30 days. Inject 13Units SQ bid for DM (Patient taking differently: Inject 40-50 Units into the skin See admin instructions.  Inject 50 units in the morning and 40 units at night) 15 mL 0  . silver sulfADIAZINE (SILVADENE) 1 % cream Apply 1 application topically daily. (Patient taking differently: Apply 1 application topically every other day. ) 50 g 0  . Amino Acids-Protein Hydrolys (FEEDING SUPPLEMENT, PRO-STAT SUGAR FREE 64,) LIQD Take 30 mLs by mouth 2 (two) times daily. (Patient not taking: Reported on 04/23/2018) 887 mL 0  . Blood Glucose Monitoring Suppl (ONE TOUCH ULTRA 2) w/Device KIT 1 kit by Does not apply route 3 (three) times daily. ICD-10 code: E11.40 1 each 0  . cyclobenzaprine (FLEXERIL) 10 MG tablet Take 1 tablet  (10 mg total) by mouth at bedtime. (Patient not taking: Reported on 04/23/2018) 20 tablet 0  . diclofenac sodium (VOLTAREN) 1 % GEL Apply 4 g topically 4 (four) times daily. (Patient not taking: Reported on 04/23/2018) 100 g 0  . doxycycline (VIBRAMYCIN) 100 MG capsule Take 1 capsule (100 mg total) by mouth 2 (two) times daily. (Patient not taking: Reported on 04/23/2018) 28 capsule 1  . glucose blood (ONE TOUCH ULTRA TEST) test strip 1 each by Other route 3 (three) times daily. ICD-10 code: E11.40. 100 each 0  . glucose monitoring kit (FREESTYLE) monitoring kit 1 each by Does not apply route as needed for other. 1 each 0  . Insulin Pen Needle (B-D UF III MINI PEN NEEDLES) 31G X 5 MM MISC USE AS DIRECTED FOR INSULIN INJECTION TWICE DAILY 200 each 0  . Insulin Pen Needle 29G X 12MM MISC Inject 1 pen into the skin 2 (two) times daily. Check blood sugar daily. 100 each 0  . Insulin Syringe-Needle U-100 (ULTICARE INSULIN SYRINGE) 30G X 5/16" 1 ML MISC USE AS DIRECTED 100 each 0  . midodrine (PROAMATINE) 10 MG tablet Take 1 tablet (10 mg total) by mouth 3 (three) times daily with meals. (Patient not taking: Reported on 04/23/2018) 90 tablet 0  . OneTouch Delica Lancets 98X MISC 1 Device by Does not apply route as needed (to check blood glucose). 100 each 0  . pantoprazole (PROTONIX) 40 MG tablet Take 1 tablet (40 mg total) by mouth daily. (Patient not taking: Reported on 04/23/2018) 30 tablet 0  . pentoxifylline (TRENTAL) 400 MG CR tablet TAKE 1 TABLET(400 MG) BY MOUTH THREE TIMES DAILY WITH MEALS (Patient not taking: Reported on 04/23/2018) 270 tablet 0    Results for orders placed or performed during the hospital encounter of 04/24/18 (from the past 48 hour(s))  Glucose, capillary     Status: Abnormal   Collection Time: 04/24/18  7:49 AM  Result Value Ref Range   Glucose-Capillary 69 (L) 70 - 99 mg/dL  I-STAT 4, (NA,K, GLUC, HGB,HCT)     Status: Abnormal   Collection Time: 04/24/18  8:31 AM  Result Value  Ref Range   Sodium 141 135 - 145 mmol/L   Potassium 3.9 3.5 - 5.1 mmol/L   Glucose, Bld 65 (L) 70 - 99 mg/dL   HCT 33.0 (L) 36.0 - 46.0 %   Hemoglobin 11.2 (L) 12.0 - 15.0 g/dL  Glucose, capillary     Status: Abnormal   Collection Time: 04/24/18  8:39 AM  Result Value Ref Range   Glucose-Capillary 106 (H) 70 - 99 mg/dL   No results found.  Review of Systems  All other systems reviewed and are negative.   Blood pressure (!) 116/31, pulse (!) 53, temperature 98.4 F (36.9 C), temperature source Oral, resp. rate 20, height 5' 4" (1.626 m),  weight 83.9 kg, last menstrual period 09/18/2011, SpO2 100 %. Physical Exam  Patient is alert, oriented, no adenopathy, well-dressed, normal affect, normal respiratory effort. The right foot has good palpable and biphasic pulses by Doppler. The ischemic areas over the dorsum of the foot and the lateral great toe are stable. The dorsal incision has dehisced and there is tan brown drainage with odor and necrotic tissue. The wound tracks to the plantar surface of the foot at least ~ 3 cm. There is mild edema.  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  Plan: We will plan for transmetatarsal amputation.  Risks and benefits were discussed including infection neurovascular injury nonhealing the wound need for higher level amputation.  Patient states she understands wished to proceed at this time.  [name], MD 04/24/2018, 9:25 AM

## 2018-04-24 NOTE — Transfer of Care (Signed)
Immediate Anesthesia Transfer of Care Note  Patient: Rachel Chaney  Procedure(s) Performed: RIGHT TRANSMETATARSAL AMPUTATION, PLACE VAC (Right Foot)  Patient Location: PACU  Anesthesia Type:MAC combined with regional for post-op pain  Level of Consciousness: awake, alert  and oriented  Airway & Oxygen Therapy: Patient Spontanous Breathing and Patient connected to face mask oxygen  Post-op Assessment: Report given to RN, Post -op Vital signs reviewed and stable and Patient moving all extremities  Post vital signs: Reviewed and stable  Last Vitals:  Vitals Value Taken Time  BP    Temp    Pulse 76 04/24/2018 11:34 AM  Resp 21 04/24/2018 11:34 AM  SpO2 91 % 04/24/2018 11:34 AM  Vitals shown include unvalidated device data.  Last Pain:  Vitals:   04/24/18 0808  TempSrc:   PainSc: 0-No pain      Patients Stated Pain Goal: 2 (74/73/40 3709)  Complications: No apparent anesthesia complications

## 2018-04-24 NOTE — Anesthesia Postprocedure Evaluation (Signed)
Anesthesia Post Note  Patient: Rachel Chaney  Procedure(s) Performed: RIGHT TRANSMETATARSAL AMPUTATION, PLACE VAC (Right Foot)     Patient location during evaluation: PACU Anesthesia Type: MAC Level of consciousness: awake and alert Pain management: pain level controlled Vital Signs Assessment: post-procedure vital signs reviewed and stable Respiratory status: spontaneous breathing, nonlabored ventilation, respiratory function stable and patient connected to nasal cannula oxygen Cardiovascular status: stable and blood pressure returned to baseline Postop Assessment: no apparent nausea or vomiting Anesthetic complications: no    Last Vitals:  Vitals:   04/24/18 1135 04/24/18 1147  BP: (!) 106/58 111/62  Pulse: 76 75  Resp: (!) 21 16  Temp: (!) 36.2 C   SpO2: 91% 99%    Last Pain:  Vitals:   04/24/18 1135  TempSrc:   PainSc: 0-No pain                 Shevelle Smither S

## 2018-04-24 NOTE — Op Note (Signed)
04/24/2018  11:30 AM  PATIENT:  Rachel Chaney    PRE-OPERATIVE DIAGNOSIS:  Dehiscence Right Foot 2nd/3rd Toe Amputation  POST-OPERATIVE DIAGNOSIS:  Same  PROCEDURE:  RIGHT TRANSMETATARSAL AMPUTATION, PLACE VAC  SURGEON:  [name], MD  PHYSICIAN ASSISTANT:None ANESTHESIA:   General  PREOPERATIVE INDICATIONS:  Rachel Chaney is a  58 y.o. female with a diagnosis of Dehiscence Right Foot 2nd/3rd Toe Amputation who failed conservative measures and elected for surgical management.    The risks benefits and alternatives were discussed with the patient preoperatively including but not limited to the risks of infection, bleeding, nerve injury, cardiopulmonary complications, the need for revision surgery, among others, and the patient was willing to proceed.  OPERATIVE IMPLANTS: 13 cm Praveena wound VAC.  @ENCIMAGES @  OPERATIVE FINDINGS: Calcified vessels with good petechial bleeding no abscess at the level of amputation.  OPERATIVE PROCEDURE: Patient was brought the operating room and underwent a regional anesthetic.  After adequate levels anesthesia were obtained patient's right lower extremity was prepped using ChloraPrep draped into a sterile field a timeout was called.  A fishmouth incision was made proximal to the open wounds.  This was carried down to bone a oscillating saw was used to perform a transmetatarsal amputation.  Fibrinous tissue was removed from the wound bed there was no abscess no gangrenous changes patient had calcified vessels which required electrocautery for hemostasis.  The wound was irrigated with normal saline the incision was closed using 2-0 nylon and a Praveena wound VAC was applied this had a good suction fit.   DISCHARGE PLANNING:  Antibiotic duration: Continue antibiotics for 24 hours  Weightbearing: Ideally nonweightbearing on the right  Pain medication: Opioid pathway ordered  Dressing care/ Wound VAC: Continue wound VAC for 1  week  Ambulatory devices: Walker  Discharge to: Anticipate discharge to home when safe with therapy.  Follow-up: In the office 1 week post operative.

## 2018-04-24 NOTE — Anesthesia Procedure Notes (Signed)
Anesthesia Regional Block: Popliteal block   Pre-Anesthetic Checklist: ,, timeout performed, Correct Patient, Correct Site, Correct Laterality, Correct Procedure, Correct Position, site marked, Risks and benefits discussed,  Surgical consent,  Pre-op evaluation,  At surgeon's request and post-op pain management  Laterality: Right  Prep: chloraprep       Needles:  Injection technique: Single-shot  Needle Type: Echogenic Needle     Needle Length: 9cm      Additional Needles:   Procedures:,,,, ultrasound used (permanent image in chart),,,,  Narrative:  Start time: 04/24/2018 9:30 AM End time: 04/24/2018 9:41 AM Injection made incrementally with aspirations every 5 mL.  Performed by: Personally  Anesthesiologist: Myrtie Soman, MD  Additional Notes: Patient tolerated the procedure well without complications

## 2018-04-25 ENCOUNTER — Encounter (HOSPITAL_COMMUNITY): Payer: Self-pay | Admitting: Orthopedic Surgery

## 2018-04-25 DIAGNOSIS — T8131XA Disruption of external operation (surgical) wound, not elsewhere classified, initial encounter: Secondary | ICD-10-CM | POA: Diagnosis not present

## 2018-04-25 DIAGNOSIS — N2581 Secondary hyperparathyroidism of renal origin: Secondary | ICD-10-CM | POA: Diagnosis not present

## 2018-04-25 DIAGNOSIS — E1122 Type 2 diabetes mellitus with diabetic chronic kidney disease: Secondary | ICD-10-CM | POA: Diagnosis not present

## 2018-04-25 DIAGNOSIS — I120 Hypertensive chronic kidney disease with stage 5 chronic kidney disease or end stage renal disease: Secondary | ICD-10-CM | POA: Diagnosis not present

## 2018-04-25 DIAGNOSIS — N186 End stage renal disease: Secondary | ICD-10-CM | POA: Diagnosis not present

## 2018-04-25 DIAGNOSIS — Z9981 Dependence on supplemental oxygen: Secondary | ICD-10-CM | POA: Diagnosis not present

## 2018-04-25 DIAGNOSIS — E8889 Other specified metabolic disorders: Secondary | ICD-10-CM | POA: Diagnosis not present

## 2018-04-25 DIAGNOSIS — Z79899 Other long term (current) drug therapy: Secondary | ICD-10-CM | POA: Diagnosis not present

## 2018-04-25 DIAGNOSIS — Z794 Long term (current) use of insulin: Secondary | ICD-10-CM | POA: Diagnosis not present

## 2018-04-25 DIAGNOSIS — E785 Hyperlipidemia, unspecified: Secondary | ICD-10-CM | POA: Diagnosis not present

## 2018-04-25 DIAGNOSIS — Z8249 Family history of ischemic heart disease and other diseases of the circulatory system: Secondary | ICD-10-CM | POA: Diagnosis not present

## 2018-04-25 DIAGNOSIS — Z7951 Long term (current) use of inhaled steroids: Secondary | ICD-10-CM | POA: Diagnosis not present

## 2018-04-25 DIAGNOSIS — Z8674 Personal history of sudden cardiac arrest: Secondary | ICD-10-CM | POA: Diagnosis not present

## 2018-04-25 DIAGNOSIS — Z992 Dependence on renal dialysis: Secondary | ICD-10-CM | POA: Diagnosis not present

## 2018-04-25 LAB — CBC
HCT: 36.1 % (ref 36.0–46.0)
Hemoglobin: 10.8 g/dL — ABNORMAL LOW (ref 12.0–15.0)
MCH: 31.7 pg (ref 26.0–34.0)
MCHC: 29.9 g/dL — ABNORMAL LOW (ref 30.0–36.0)
MCV: 105.9 fL — ABNORMAL HIGH (ref 80.0–100.0)
Platelets: 323 10*3/uL (ref 150–400)
RBC: 3.41 MIL/uL — ABNORMAL LOW (ref 3.87–5.11)
RDW: 17.2 % — ABNORMAL HIGH (ref 11.5–15.5)
WBC: 18.1 10*3/uL — ABNORMAL HIGH (ref 4.0–10.5)
nRBC: 0 % (ref 0.0–0.2)

## 2018-04-25 LAB — GLUCOSE, CAPILLARY
Glucose-Capillary: 247 mg/dL — ABNORMAL HIGH (ref 70–99)
Glucose-Capillary: 178 mg/dL — ABNORMAL HIGH (ref 70–99)
Glucose-Capillary: 141 mg/dL — ABNORMAL HIGH (ref 70–99)

## 2018-04-25 LAB — RENAL FUNCTION PANEL
Albumin: 2.5 g/dL — ABNORMAL LOW (ref 3.5–5.0)
Anion gap: 17 — ABNORMAL HIGH (ref 5–15)
BUN: 63 mg/dL — ABNORMAL HIGH (ref 6–20)
CO2: 19 mmol/L — ABNORMAL LOW (ref 22–32)
Calcium: 8 mg/dL — ABNORMAL LOW (ref 8.9–10.3)
Chloride: 98 mmol/L (ref 98–111)
Creatinine, Ser: 10.37 mg/dL — ABNORMAL HIGH (ref 0.44–1.00)
GFR calc Af Amer: 4 mL/min — ABNORMAL LOW (ref >60)
GFR calc non Af Amer: 4 mL/min — ABNORMAL LOW (ref >60)
Glucose, Bld: 303 mg/dL — ABNORMAL HIGH (ref 70–99)
Phosphorus: 10.1 mg/dL — ABNORMAL HIGH (ref 2.5–4.6)
Potassium: 4.9 mmol/L (ref 3.5–5.1)
Sodium: 134 mmol/L — ABNORMAL LOW (ref 135–145)

## 2018-04-25 MED ORDER — LIDOCAINE-PRILOCAINE 2.5-2.5 % EX CREA
1.0000 "application " | TOPICAL_CREAM | CUTANEOUS | Status: DC | PRN
  Filled 2018-04-25: qty 5

## 2018-04-25 MED ORDER — FERRIC CITRATE 1 GM 210 MG(FE) PO TABS
210.0000 mg | ORAL_TABLET | ORAL | Status: DC
  Administered 2018-04-25 – 2018-04-26 (×2): 210 mg via ORAL
  Filled 2018-04-25 (×4): qty 1

## 2018-04-25 MED ORDER — SODIUM CHLORIDE 0.9 % IV SOLN: 100.0000 mL | INTRAVENOUS | Status: DC | PRN

## 2018-04-25 MED ORDER — CALCITRIOL 0.5 MCG PO CAPS
1.5000 ug | ORAL_CAPSULE | ORAL | Status: DC
  Administered 2018-04-25 – 2018-04-26 (×2): 1.5 ug via ORAL
  Filled 2018-04-25 (×2): qty 3

## 2018-04-25 MED ORDER — PRO-STAT SUGAR FREE PO LIQD
30.0000 mL | Freq: Two times a day (BID) | ORAL | Status: DC
  Administered 2018-04-26: 30 mL via ORAL
  Filled 2018-04-25 (×2): qty 30

## 2018-04-25 MED ORDER — CHLORHEXIDINE GLUCONATE CLOTH 2 % EX PADS
6.0000 | MEDICATED_PAD | Freq: Every day | CUTANEOUS | Status: DC
  Administered 2018-04-25: 6 via TOPICAL

## 2018-04-25 MED ORDER — PENTAFLUOROPROP-TETRAFLUOROETH EX AERO: 1.0000 "application " | INHALATION_SPRAY | CUTANEOUS | Status: DC | PRN

## 2018-04-25 MED ORDER — LIDOCAINE HCL (PF) 1 % IJ SOLN: 5.0000 mL | INTRAMUSCULAR | Status: DC | PRN

## 2018-04-25 MED ORDER — RENA-VITE PO TABS
1.0000 | ORAL_TABLET | Freq: Every day | ORAL | Status: DC
  Administered 2018-04-25: 1 via ORAL
  Filled 2018-04-25: qty 1

## 2018-04-25 NOTE — Plan of Care (Signed)

## 2018-04-25 NOTE — Progress Notes (Signed)
Subjective: 1 Day Post-Op Procedure(s) (LRB): RIGHT TRANSMETATARSAL AMPUTATION, PLACE VAC (Right) Patient reports pain as mild.    Objective: Vital signs in last 24 hours: Temp:  [97.2 F (36.2 C)-98.6 F (37 C)] 98.6 F (37 C) (04/16 0029) Pulse Rate:  [69-91] 75 (04/16 0808) Resp:  [14-21] 16 (04/16 0029) BP: (91-165)/(45-96) 140/67 (04/16 0808) SpO2:  [88 %-100 %] 91 % (04/16 0029)  Intake/Output from previous day: 04/15 0701 - 04/16 0700 In: 1230 [P.O.:680; I.V.:450; IV Piggyback:100] Out: 100 [Blood:100] Intake/Output this shift: No intake/output data recorded.  Recent Labs    04/24/18 0831  HGB 11.2*   Recent Labs    04/24/18 0831  HCT 33.0*   Recent Labs    04/24/18 0831  NA 141  K 3.9  GLUCOSE 65*   No results for input(s): LABPT, INR in the last 72 hours.  The right transmetatarsal amputation site with VAC dressing in place and functioning well with no drainage in canister   Assessment/Plan: 1 Day Post-Op Procedure(s) (LRB): RIGHT TRANSMETATARSAL AMPUTATION, PLACE VAC (Right) Plan for DC tomorrow with [facility] after HD   [facility], PA-C 04/25/2018, 9:25 AM  [employer] [phone]

## 2018-04-25 NOTE — Procedures (Signed)
   I was present at this dialysis session, have reviewed the session itself and made  appropriate changes Kelly Splinter MD [place] pager [phone]   04/25/2018, 4:29 PM

## 2018-04-25 NOTE — Evaluation (Signed)
Physical Therapy Evaluation Patient Details Name: Rachel Chaney MRN: [id number] DOB: 1960-03-12 Today's Date: 04/25/2018   History of Present Illness  Pt is a 58 y.o. female admitted 04/24/18 with dehiscence of R foot 2nd and 3rd ray amputation (s/p 02/24/18 with prolonged hospitalization); now s/p R transmet amputation with wound vac placement 4/15. PMH includes ESRD (HD MWF), HTN, DM, CKD, CHF.    Clinical Impression  Pt presents with an overall decrease in functional mobility secondary to above. PTA, pt ambulating with RW, assist from family for ADLs, working with [place] services. Educ on RLE NWB precautions, positioning, therex, and importance of mobility. Today, pt able to initiate transfer and gait training with RW and up to [place]; pt with difficulty maintaining RLE NWB precautions despite max cues/education. Recommend use of w/c upon return home if pt continues to not maintain; pt understands. Pt would benefit from continued acute PT services to maximize functional mobility and independence prior to d/c with continued HHPT services.     Follow Up Recommendations Home health PT;Supervision for mobility/OOB    Equipment Recommendations  None recommended by PT    Recommendations for Other Services       Precautions / Restrictions Precautions Precautions: Fall Restrictions Weight Bearing Restrictions: Yes RLE Weight Bearing: Non weight bearing      Mobility  Bed Mobility Overal bed mobility: Needs Assistance Bed Mobility: Supine to Sit     Supine to sit: Supervision;HOB elevated        Transfers Overall transfer level: Needs assistance Equipment used: Rolling walker (2 wheeled) Transfers: Sit to/from Stand Sit to Stand: Min assist;Mod assist         General transfer comment: ModA to stand from EOB, minA to stand from recliner with heavy reliance on UE support to push into standing; cues for safe hand placement with RW. Pt with poor eccentric control into sitting  despite cues  Ambulation/Gait Ambulation/Gait assistance: Min assist Gait Distance (Feet): 2 Feet Assistive device: Rolling walker (2 wheeled) Gait Pattern/deviations: Step-to pattern;Antalgic;Decreased weight shift to right Gait velocity: Decreased   General Gait Details: Pt taking steps with WB through R foot despite cues and educ at techniques to maintain RLE NWB; performed second gait trial and pt still unable to maintain NWB. MinA to maintain balance with RW; DOE 3/5, SpO2 down to 81% on RA. Returning to 97% with 2L O2 [place] and seated rest  Stairs            Wheelchair Mobility    Modified Rankin (Stroke Patients Only)       Balance Overall balance assessment: Needs assistance   Sitting balance-Leahy Scale: Fair       Standing balance-Leahy Scale: Poor Standing balance comment: Heavy reliance on BUE support                             Pertinent Vitals/Pain Pain Assessment: Faces Faces Pain Scale: Hurts a little bit Pain Location: R foot Pain Descriptors / Indicators: Sore Pain Intervention(s): Repositioned;Monitored during session    Home Living Family/patient expects to be discharged to:: Private residence Living Arrangements: Spouse/significant other;Children Available Help at Discharge: Family;Available 24 hours/day Type of Home: House Home Access: Ramped entrance     Home Layout: One level Home Equipment: Walker - 2 wheels;Bedside commode;Wheelchair - manual;Grab bars - tub/shower      Prior Function Level of Independence: Needs assistance   Gait / Transfers Assistance Needed: Ambulatory with RW  ADL's / Homemaking Assistance Needed: Spouse and/or daughter assist with bathing, toileting and household tasks  Comments: Wears 2L O2 [place] baseline     Hand Dominance        Extremity/Trunk Assessment   Upper Extremity Assessment Upper Extremity Assessment: Overall WFL for tasks assessed    Lower Extremity Assessment Lower Extremity  Assessment: RLE deficits/detail RLE Deficits / Details: s/p R transmet amputation; hip flex <3/5, knee flex/ext at least 3/5       Communication   Communication: No difficulties  Cognition Arousal/Alertness: Awake/alert Behavior During Therapy: Flat affect Overall Cognitive Status: Within Functional Limits for tasks assessed                                 General Comments: WFL for simple tasks, although does not seem concerned about importance of maintaining RLE NWB precautions      General Comments      Exercises General Exercises - Lower Extremity Long Arc Quad: AROM;Right;Seated Hip Flexion/Marching: AROM;Right;Seated   Assessment/Plan    PT Assessment Patient needs continued PT services  PT Problem List Decreased strength;Decreased range of motion;Decreased activity tolerance;Decreased balance;Decreased mobility;Decreased knowledge of use of DME;Decreased knowledge of precautions;Pain       PT Treatment Interventions DME instruction;Gait training;Functional mobility training;Therapeutic activities;Therapeutic exercise;Balance training;Wheelchair mobility training;Patient/family education    PT Goals (Current goals can be found in the Care Plan section)  Acute Rehab PT Goals Patient Stated Goal: Return home with continued support from family PT Goal Formulation: With patient Time For Goal Achievement: 05/09/18 Potential to Achieve Goals: Good    Frequency Min 5X/week   Barriers to discharge        Co-evaluation               AM-PAC PT "6 Clicks" Mobility  Outcome Measure Help needed turning from your back to your side while in a flat bed without using bedrails?: None Help needed moving from lying on your back to sitting on the side of a flat bed without using bedrails?: None Help needed moving to and from a bed to a chair (including a wheelchair)?: A Little Help needed standing up from a chair using your arms (e.g., wheelchair or bedside  chair)?: A Little Help needed to walk in hospital room?: A Lot Help needed climbing 3-5 steps with a railing? : Total 6 Click Score: 17    End of Session Equipment Utilized During Treatment: Oxygen Activity Tolerance: Patient tolerated treatment well Patient left: in chair;with call bell/phone within reach Nurse Communication: Mobility status PT Visit Diagnosis: Other abnormalities of gait and mobility (R26.89);Muscle weakness (generalized) (M62.81)    Time: 0932-3557 PT Time Calculation (min) (ACUTE ONLY): 13 min   Charges:   PT Evaluation $PT Eval Moderate Complexity: [place], PT, DPT Acute Rehabilitation Services  Pager [phone] Office [place] 04/25/2018, 9:03 AM

## 2018-04-25 NOTE — Consult Note (Signed)
[facility] Renal Consultation Note    Indication for Consultation:  Management of ESRD/hemodialysis; anemia, hypertension/volume and secondary hyperparathyroidism PCP: Dr. Shawna Orleans  HPI: Rachel Chaney is a 58 y.o. female with ESRD on hemodialysis MWF at [facility]. PMH significant for DMT2, HTN, Morbid obesity, cardiac arrest, SHPT, AOCD. 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 She had been going to orthopedics for wound care twice weekly. She noticed drainage and odor. She was seen again as OP and agreed to R. Transmetatarsal amputation which was performed by Dr. Sharol Given 04/24/2018. She has been stable post op. Currently on HD off schedule. She has no complaints, says she has been in usual state of health, doing well. No issues other than non-healing ray amputations.   Past Medical History:  Diagnosis Date  . Cardiac arrest ([place]) 02/28/2018   while on hemodialysis in hospital  . CHF (congestive heart failure) ([place])   . Chronic kidney disease due to diabetes mellitus ([place]) 10/13/2013  . Diabetes mellitus    Type II  . ESRD (end stage renal disease) ([place])    MWF - [facility]  . Hyperlipidemia   . Hypertension    Past Surgical History:  Procedure Laterality Date  . AMPUTATION Right 02/27/2018   Procedure: RIGHT FOOT 2ND AND 3RD RAY AMPUTATION;  Surgeon: [name], MD;  Location: [place];  Service: Orthopedics;  Laterality: Right;  . AMPUTATION Right 04/24/2018   Procedure: RIGHT TRANSMETATARSAL AMPUTATION, PLACE VAC;  Surgeon: [name], MD;  Location: [place];  Service: Orthopedics;  Laterality: Right;  . AV FISTULA PLACEMENT Left 04/15/2015   Procedure: ARTERIOVENOUS (AV) FISTULA CREATION;  Surgeon: Mal Misty, MD;  Location: [place];  Service: Vascular;  Laterality: Left;  . EYE SURGERY Right    retinal detachment  . FISTULA SUPERFICIALIZATION Left 06/03/2015   Procedure: LEFT UPPER ARM BRACHIOCEPHALIC FISTULA SUPERFICIALIZATION;   Surgeon: Mal Misty, MD;  Location: [place];  Service: Vascular;  Laterality: Left;  from MAC to General   Family History  Problem Relation Age of Onset  . Heart disease Mother   . Heart disease Father    Social History:  reports that she has never smoked. She has never used smokeless tobacco. She reports that she does not drink alcohol or use drugs. No Known Allergies Prior to Admission medications   Medication Sig Start Date End Date Taking? Authorizing Provider  acetaminophen (TYLENOL) 500 MG tablet Take 1,000 mg by mouth every 6 (six) hours as needed for pain.   Yes [provider]  atorvastatin (LIPITOR) 80 MG tablet Take 1 tablet (80 mg total) by mouth daily. Patient taking differently: Take 80 mg by mouth at bedtime.  03/26/18  Yes Lassen, Arlo C, PA-C  carvedilol (COREG) 25 MG tablet Take 25 mg by mouth 2 (two) times daily. 04/14/18  Yes [provider]  cetirizine (ZYRTEC) 10 MG tablet Take 10 mg by mouth daily as needed for allergies.   Yes [provider]  ferric citrate (AURYXIA) 1 GM 210 MG(Fe) tablet Take 2 tablets (420 mg total) by mouth 3 (three) times daily with meals. Give two (420 mg) by mouth three times a day wih meals for ESRD Patient taking differently: Take 210 mg by mouth See admin instructions. Take 210 mg with each meal and large snacks 03/26/18  Yes Lassen, Arlo C, PA-C  fluticasone (FLONASE) 50 MCG/ACT nasal spray Place 1 spray into both nostrils daily. 1 spray  in each nostril every day Patient taking differently: Place 1 spray into both nostrils daily as needed for allergies.  03/26/18  Yes Lassen, Arlo C, PA-C  hydrALAZINE (APRESOLINE) 50 MG tablet Take 50 mg by mouth 3 (three) times daily.   Yes [provider]  hydrOXYzine (ATARAX/VISTARIL) 50 MG tablet Take 1 tablet (50 mg total) by mouth 2 (two) times daily as needed for itching. 03/26/18  Yes Lassen, Arlo C, PA-C  Insulin Detemir (LEVEMIR FLEXTOUCH) 100 UNIT/ML Pen Inject 13  Units into the skin 2 (two) times daily for 30 days. Inject 13Units SQ bid for DM Patient taking differently: Inject 40-50 Units into the skin See admin instructions. Inject 50 units in the morning and 40 units at night 03/26/18 04/25/18 Yes Lassen, Arlo C, PA-C  silver sulfADIAZINE (SILVADENE) 1 % cream Apply 1 application topically daily. Patient taking differently: Apply 1 application topically every other day.  04/11/18  Yes Rayburn, Neta Mends, PA-C  Amino Acids-Protein Hydrolys (FEEDING SUPPLEMENT, PRO-STAT SUGAR FREE 64,) LIQD Take 30 mLs by mouth 2 (two) times daily. Patient not taking: Reported on 04/23/2018 03/26/18   Wille Celeste, PA-C  Blood Glucose Monitoring Suppl (ONE TOUCH ULTRA 2) w/Device KIT 1 kit by Does not apply route 3 (three) times daily. ICD-10 code: E11.40 03/26/18   Wille Celeste, PA-C  cyclobenzaprine (FLEXERIL) 10 MG tablet Take 1 tablet (10 mg total) by mouth at bedtime. Patient not taking: Reported on 04/23/2018 03/26/18   Wille Celeste, PA-C  diclofenac sodium (VOLTAREN) 1 % GEL Apply 4 g topically 4 (four) times daily. Patient not taking: Reported on 04/23/2018 03/26/18   Wille Celeste, PA-C  doxycycline (VIBRAMYCIN) 100 MG capsule Take 1 capsule (100 mg total) by mouth 2 (two) times daily. Patient not taking: Reported on 04/23/2018 04/11/18   Rayburn, Neta Mends, PA-C  glucose blood (ONE TOUCH ULTRA TEST) test strip 1 each by Other route 3 (three) times daily. ICD-10 code: E11.40. 03/26/18   [place] Lewis C, PA-C  glucose monitoring kit (FREESTYLE) monitoring kit 1 each by Does not apply route as needed for other. 03/26/18   [place] Lewis C, PA-C  Insulin Pen Needle (B-D UF III MINI PEN NEEDLES) 31G X 5 MM MISC USE AS DIRECTED FOR INSULIN INJECTION TWICE DAILY 03/26/18   [place] Lewis C, PA-C  Insulin Pen Needle 29G X 12MM MISC Inject 1 pen into the skin 2 (two) times daily. Check blood sugar daily. 03/26/18   [place] Lewis C, PA-C  Insulin Syringe-Needle U-100 Flossie Buffy  INSULIN SYRINGE) 30G X 5/16" 1 ML MISC USE AS DIRECTED 03/26/18   [place] Lewis C, PA-C  midodrine (PROAMATINE) 10 MG tablet Take 1 tablet (10 mg total) by mouth 3 (three) times daily with meals. Patient not taking: Reported on 04/23/2018 03/26/18   Wille Celeste, PA-C  OneTouch Delica Lancets 38S MISC 1 Device by Does not apply route as needed (to check blood glucose). 03/26/18   [place] Lewis C, PA-C  pantoprazole (PROTONIX) 40 MG tablet Take 1 tablet (40 mg total) by mouth daily. Patient not taking: Reported on 04/23/2018 03/26/18   Wille Celeste, PA-C  pentoxifylline (TRENTAL) 400 MG CR tablet TAKE 1 TABLET(400 MG) BY MOUTH THREE TIMES DAILY WITH MEALS Patient not taking: Reported on 04/23/2018 04/11/18   [name], MD  progesterone (PROMETRIUM) 200 MG capsule Take 1 capsule (200 mg total) by mouth daily. Take for 10 days if bleeding occurs for more than 4 days. 11/09/10  03/28/11  Lyndal Pulley, DO   Current Facility-Administered Medications  Medication Dose Route Frequency Provider Last Rate Last Dose  . 0.9 %  sodium chloride infusion   Intravenous Continuous [name], MD 10 mL/hr at 04/24/18 1715    . 0.9 %  sodium chloride infusion  100 mL Intravenous PRN Valentina Gu, NP      . 0.9 %  sodium chloride infusion  100 mL Intravenous PRN Valentina Gu, NP      . acetaminophen (TYLENOL) tablet 325-650 mg  325-650 mg Oral Q6H PRN [name], MD      . aspirin EC tablet 325 mg  325 mg Oral Daily [name], MD   325 mg at 04/25/18 3086  . atorvastatin (LIPITOR) tablet 80 mg  80 mg Oral Daily [name], MD   80 mg at 04/25/18 5784  . bisacodyl (DULCOLAX) suppository 10 mg  10 mg Rectal Daily PRN [name], MD      . carvedilol (COREG) tablet 25 mg  25 mg Oral BID [name], MD   25 mg at 04/25/18 0809  . ceFAZolin (ANCEF) IVPB 2g/100 mL premix  2 g Intravenous Once [name], MD      . Chlorhexidine Gluconate Cloth 2 % PADS 6 each  6 each Topical  Q0600 Valentina Gu, NP      . docusate sodium (COLACE) capsule 100 mg  100 mg Oral BID [name], MD   100 mg at 04/25/18 0806  . ferric citrate (AURYXIA) tablet 420 mg  420 mg Oral TID WC [name], MD   420 mg at 04/25/18 6962  . hydrALAZINE (APRESOLINE) tablet 50 mg  50 mg Oral TID [name], MD   50 mg at 04/25/18 0809  . HYDROmorphone (DILAUDID) injection 0.5-1 mg  0.5-1 mg Intravenous Q4H PRN [name], MD      . hydrOXYzine (ATARAX/VISTARIL) tablet 50 mg  50 mg Oral BID PRN [name], MD      . insulin aspart (novoLOG) injection 0-15 Units  0-15 Units Subcutaneous TID WC [name], MD   5 Units at 04/25/18 [phone]  . insulin aspart (novoLOG) injection 4 Units  4 Units Subcutaneous TID WC [name], MD   4 Units at 04/25/18 [phone]  . insulin detemir (LEVEMIR) injection 30 Units  30 Units Subcutaneous BID [name], MD   30 Units at 04/25/18 1057  . lidocaine (PF) (XYLOCAINE) 1 % injection 5 mL  5 mL Intradermal PRN Valentina Gu, NP      . lidocaine-prilocaine (EMLA) cream 1 application  1 application Topical PRN Valentina Gu, NP      . magnesium citrate solution 1 Bottle  1 Bottle Oral Once PRN [name], MD      . methocarbamol (ROBAXIN) tablet 500 mg  500 mg Oral Q6H PRN [name], MD   500 mg at 04/25/18 4401   Or  . methocarbamol (ROBAXIN) 500 mg in dextrose 5 % 50 mL IVPB  500 mg Intravenous Q6H PRN [name], MD      . metoCLOPramide (REGLAN) tablet 5-10 mg  5-10 mg Oral Q8H PRN [name], MD       Or  . metoCLOPramide (REGLAN) injection 5-10 mg  5-10 mg Intravenous Q8H PRN [name], MD      . ondansetron [facility]) tablet 4 mg  4 mg Oral Q6H PRN [name], MD       Or  . ondansetron [facility]) injection 4 mg  4 mg Intravenous Q6H PRN [name], MD      . oxyCODONE (Oxy IR/ROXICODONE) immediate release tablet 10-15 mg  10-15 mg Oral Q4H PRN [name], MD      . oxyCODONE (Oxy  IR/ROXICODONE) immediate release tablet 5-10 mg  5-10 mg Oral Q4H PRN [name], MD   10 mg at 04/25/18 0810  . pentafluoroprop-tetrafluoroeth (GEBAUERS) aerosol 1 application  1 application Topical PRN Valentina Gu, NP      . polyethylene glycol (MIRALAX / GLYCOLAX) packet 17 g  17 g Oral Daily PRN [name], MD       Labs: Basic Metabolic Panel: Recent Labs  Lab 04/24/18 0831 04/25/18 0956  NA 141 134*  K 3.9 4.9  CL  --  98  CO2  --  19*  GLUCOSE 65* 303*  BUN  --  63*  CREATININE  --  10.37*  CALCIUM  --  8.0*  PHOS  --  10.1*   Liver Function Tests: Recent Labs  Lab 04/25/18 0956  ALBUMIN 2.5*   No results for input(s): LIPASE, AMYLASE in the last 168 hours. No results for input(s): AMMONIA in the last 168 hours. CBC: Recent Labs  Lab 04/24/18 0831 04/25/18 0956  WBC  --  18.1*  HGB 11.2* 10.8*  HCT 33.0* 36.1  MCV  --  105.9*  PLT  --  323   Cardiac Enzymes: No results for input(s): CKTOTAL, CKMB, CKMBINDEX, TROPONINI in the last 168 hours. CBG: Recent Labs  Lab 04/24/18 1020 04/24/18 1132 04/24/18 1712 04/24/18 2133 04/25/18 0834  GLUCAP 103* 80 148* 211* 247*   Iron Studies: No results for input(s): IRON, TIBC, TRANSFERRIN, FERRITIN in the last 72 hours. Studies/Results: No results found.  ROS: As per HPI otherwise negative.  Physical Exam: Vitals:   04/25/18 1153 04/25/18 1200 04/25/18 1230 04/25/18 1300  BP: (!) 147/76 (!) 145/78 125/71 129/75  Pulse: 72 71 73 74  Resp:      Temp:      TempSrc:      SpO2:      Weight:      Height:         General: Well developed, well nourished, in no acute distress. Head: Normocephalic, atraumatic, sclera non-icteric, mucus membranes are moist Neck: Supple. JVD not elevated. Lungs: Clear bilaterally to auscultation without wheezes, rales, or rhonchi. Breathing is unlabored. Heart: RRR with S1 S2. No murmurs, rubs, or gallops appreciated. Abdomen: Soft, non-tender, non-distended  with normoactive bowel sounds. No rebound/guarding. No obvious abdominal masses. M-S:  Strength and tone appear normal for age. Lower extremities: No LLE edema. RLE with cobain drsg/wound vac on R foot.  Neuro: Alert and oriented X 3. Moves all extremities spontaneously. Psych:  Responds to questions appropriately with a normal affect. Dialysis Access: L AVF + T/B  [place] MWF  4h  450/800 97 kg 3K/2Ca bath P4  L AVF Hep 8000+ 2000 midrun -Mircera 60 mcg IV q 2 weeks (no recent doses-new order) -Calcitriol 1.5 mcg PO TIW   Assessment/Plan: 1.  Dehiscence of R Ray amputation/S/P R Transmetatarsal amputation 04/24/18 per Dr. Sharol Given with application of wound vac. Per primary 2.  ESRD -  MWF HD off schedule today. Short HD to get back on schedule tomorrow.  3.  Hypertension/volume  - BP well controlled, attempting  UFG 3.0 on HD. Had been getting to EDW at OP clinic.  4.  Anemia  - HGB 10.8 No ESA needed. Follow HGB.  5.  Metabolic bone disease -  Phos 10.1,  Ca 8.0 C Ca 9.2. Continue Auryxia binders, VDRA,  6.  Nutrition -Renal/Carb mod diet, prostat, renal vits 7.  DM-per primary 8.  H/O cardiac arrest  Rita H. Owens Shark, NP-C 04/25/2018, 2:16 PM  [employer] [phone]  Pt seen, examined and agree w A/P as above.  [facility] 04/25/2018, 4:26 PM

## 2018-04-26 LAB — GLUCOSE, RANDOM: Glucose, Bld: 81 mg/dL (ref 70–99)

## 2018-04-26 LAB — GLUCOSE, CAPILLARY
Glucose-Capillary: 24 mg/dL — CL (ref 70–99)
Glucose-Capillary: 27 mg/dL — CL (ref 70–99)
Glucose-Capillary: 37 mg/dL — CL (ref 70–99)
Glucose-Capillary: 74 mg/dL (ref 70–99)
Glucose-Capillary: 56 mg/dL — ABNORMAL LOW (ref 70–99)
Glucose-Capillary: 84 mg/dL (ref 70–99)

## 2018-04-26 MED ORDER — CALCITRIOL 0.5 MCG PO CAPS
ORAL_CAPSULE | ORAL | Status: AC
  Filled 2018-04-26: qty 3

## 2018-04-26 MED ORDER — INSULIN DETEMIR 100 UNIT/ML ~~LOC~~ SOLN: 30.0000 [IU] | Freq: Two times a day (BID) | SUBCUTANEOUS | 11 refills | Status: DC

## 2018-04-26 MED ORDER — GLUCOSE 4 G PO CHEW
CHEWABLE_TABLET | ORAL | Status: AC
  Filled 2018-04-26: qty 1

## 2018-04-26 NOTE — Progress Notes (Signed)
Inpatient Diabetes Program Recommendations  AACE/ADA: New Consensus Statement on Inpatient Glycemic Control (2015)  Target Ranges:  Prepandial:   less than 140 mg/dL      Peak postprandial:   less than 180 mg/dL (1-2 hours)      Critically ill patients:  140 - 180 mg/dL   Lab Results  Component Value Date   GLUCAP 84 04/26/2018   HGBA1C 11.4 (H) 02/26/2018    Review of Glycemic Control Results for LOGEN, FOWLE (MRN [id number]) as of 04/26/2018 14:02  Ref. Range 04/26/2018 07:22 04/26/2018 07:39 04/26/2018 08:03 04/26/2018 08:56  Glucose-Capillary Latest Ref Range: 70 - 99 mg/dL 27 (LL) 37 (LL) 74 56 (L)   Current orders for Inpatient glycemic control: Levemir 30 units BID, Novolog 4 units TID, Novolog 0-15 units TID  Inpatient Diabetes Program Recommendations:    Noted severe hypoglycemia this aM of 27 mg/dL with subsequent interventions. Consider decreasing Levemir at discharge to 20 units BID.   Thanks, Bronson Curb, MSN, RNC-OB Diabetes Coordinator [phone] (8a-5p)

## 2018-04-26 NOTE — Progress Notes (Addendum)
[facility] Progress Note   Subjective:  Seen on HD today - 2L UF goal, tolerating. Plan was initially to d/c this AM prior to her usual dialysis session, but she had some transportation issues therefore will be dialyzed here prior to d/c.  Objective Vitals:   04/26/18 1144 04/26/18 1200 04/26/18 1230 04/26/18 1300  BP: 139/80 (!) 147/70 130/69 140/74  Pulse: 83 78 77 80  Resp:      Temp:      TempSrc:      SpO2:      Weight:      Height:       Physical Exam General: Well appearing woman, NAD Heart: RRR; no murmur Lungs: CTAB Abdomen: soft, non-tender Extremities: R TMA bandaged with wound vac, no LE edema Dialysis Access: LUE AVF + thrill (cannulated)  Additional Objective Labs: Basic Metabolic Panel: Recent Labs  Lab 04/24/18 0831 04/25/18 0956 04/26/18 1151  NA 141 134*  --   K 3.9 4.9  --   CL  --  98  --   CO2  --  19*  --   GLUCOSE 65* 303* 81  BUN  --  63*  --   CREATININE  --  10.37*  --   CALCIUM  --  8.0*  --   PHOS  --  10.1*  --    Liver Function Tests: Recent Labs  Lab 04/25/18 0956  ALBUMIN 2.5*   CBC: Recent Labs  Lab 04/24/18 0831 04/25/18 0956  WBC  --  18.1*  HGB 11.2* 10.8*  HCT 33.0* 36.1  MCV  --  105.9*  PLT  --  323   Medications: . sodium chloride 10 mL/hr at 04/24/18 1715  . methocarbamol (ROBAXIN) IV     . aspirin EC  325 mg Oral Daily  . atorvastatin  80 mg Oral Daily  . calcitRIOL      . calcitRIOL  1.5 mcg Oral Q M,W,F-HD  . carvedilol  25 mg Oral BID  . Chlorhexidine Gluconate Cloth  6 each Topical Q0600  . docusate sodium  100 mg Oral BID  . feeding supplement (PRO-STAT SUGAR FREE 64)  30 mL Oral BID  . ferric citrate  210 mg Oral With snacks  . ferric citrate  420 mg Oral TID WC  . hydrALAZINE  50 mg Oral TID  . insulin aspart  0-15 Units Subcutaneous TID WC  . insulin aspart  4 Units Subcutaneous TID WC  . insulin detemir  30 Units Subcutaneous BID  . multivitamin  1 tablet Oral QHS     Dialysis Orders: [place] MWF  4h  450/800 97 kg 3K/2Ca bath P4  L AVF Hep 8000+ 2000 midrun -Mircera 60 mcg IV q 2 weeks (no recent doses-new order) -Calcitriol 1.5 mcg PO TIW   Assessment/Plan: 1.  Dehiscence of R ray amputation: Now s/p R TMA 4/15, wound vac in place. Per primary 2.  ESRD: HD per MWF schedule. No heparin. D/c home later today. 3.  Hypertension/volume: BP ok, 2L UF goal today. 4.  Anemia  - HGB 10.8 No ESA needed. Follow HGB.  5.  Metabolic bone disease -  Phos 10.1,  Ca 8.0 C Ca 9.2. Continue Auryxia binders, VDRA,  6.  Nutrition -Renal/Carb mod diet, prostat, renal vits 7.  DM-per primary 8.  Hx cardiac arrest  Rachel Chaney 04/26/2018, 1:36 PM  [facility] Pager: [phone]  Pt seen, examined and agree w A/P as above.  Rob  [facility] 04/26/2018, 3:04 PM

## 2018-04-26 NOTE — TOC Initial Note (Signed)
Transition of Care [facility]) - Initial/Assessment Note    Patient Details  Name: Rachel Chaney MRN: [id number] Date of Birth: [birth year]  [facility] CM/SW Contact:    Ninfa Meeker, RN Phone Number: 04/26/2018, 4:02 PM  Clinical Narrative:     Pt is a 58 y.o. female admitted 04/24/18 with dehiscence of R foot 2nd and 3rd ray amputation (s/p 02/24/18 with prolonged hospitalization); now s/p R transmet amputation with wound vac placement 4/15. Case manager spoke with patient concerning discharge plan. Patient says she has worked with [facility] and wishes to do so now. CM called referral to [place], [facility]ia[facility]. SHe will have family support at discharge.           Expected Discharge Plan: [place] Barriers to Discharge: No Barriers Identified   Patient Goals and CMS Choice     Choice offered to / list presented to : Patient  Expected Discharge Plan and Services Expected Discharge Plan: [place]   Discharge Planning Services: CM Consult Post Acute Care Choice: [place] arrangements for the past 2 months: Single Family Home Expected Discharge Date: 04/26/18               DME Arranged: N/A(has DME) DME Agency: NA HH Arranged: PT, OT HH Agency: [place]  Prior Living Arrangements/Services Living arrangements for the past 2 months: [place] Lives with:: Spouse   Do you feel safe going back to the place where you live?: Yes      Need for Family Participation in Patient Care: Yes (Comment) Care giver support system in place?: Yes (comment)      Activities of Daily Living Home Assistive Devices/Equipment: Dentures (specify type), CBG Meter, Blood pressure cuff, Walker (specify type), Wheelchair, Oxygen, Cane (specify quad or straight), Shower chair without back ADL Screening (condition at time of admission) Patient's cognitive ability adequate to safely complete  daily activities?: Yes Is the patient deaf or have difficulty hearing?: No Does the patient have difficulty seeing, even when wearing glasses/contacts?: No Does the patient have difficulty concentrating, remembering, or making decisions?: No Patient able to express need for assistance with ADLs?: Yes Does the patient have difficulty dressing or bathing?: No Independently performs ADLs?: Yes (appropriate for developmental age) Does the patient have difficulty walking or climbing stairs?: No Weakness of Legs: None Weakness of Arms/Hands: None  Permission Sought/Granted Permission sought to share information with : Case Manager                Emotional Assessment       Orientation: : Oriented to Self, Oriented to Situation, Oriented to  Time, Oriented to Place Alcohol / Substance Use: Not Applicable    Admission diagnosis:  S/P transmetatarsal amputation of foot, right [facility]) [J24.268] Patient Active Problem List   Diagnosis Date Noted  . S/P transmetatarsal amputation of foot, right ([place]) 04/24/2018  . Wound dehiscence, surgical   . Cardiac arrest ([place]) 03/19/2018  . PAF (paroxysmal atrial fibrillation) ([place])   . Respiratory distress   . Pulmonary infiltrates   . Ventilator associated pneumonia ([place])   . Oxygen dependent   . Hypotension   . Respiratory failure ([place])   . Type 2 diabetes mellitus with diabetic neuropathy ([place])   . Abscess of toe of right foot 02/26/2018  . Diabetes mellitus type 2, uncontrolled ([place]) 02/26/2018  . Toe infection   . Diabetic polyneuropathy associated with  type 2 diabetes mellitus ([place])   . Osteomyelitis of second toe of right foot ([place])   . Severe protein-calorie malnutrition ([place])   . Open toe fracture 02/25/2018  . Seasonal allergies 04/12/2017  . Post-menopausal bleeding 12/07/2015  . ESRD on dialysis ([place]) 05/25/2015  . Hypoalbuminemia   . Abnormal appearance of cervix 08/08/2011  . Back pain 08/07/2011  . GERD (gastroesophageal  reflux disease) 04/27/2010  . Hyperlipidemia 03/08/2006  . Morbid obesity ([place]) 03/08/2006  . Essential hypertension with morbid obesity 03/08/2006   PCP:  Bufford Lope, DO Pharmacy:   [facility], [place] - [address] AT [place] [address] Phone: [phone] Fax: [phone]  [facility] [place], [place] - [address] [employer], [address] [place] [address] Phone: [phone] Fax: [phone]     Social Determinants of Health (SDOH) Interventions    Readmission Risk Interventions No flowsheet data found.

## 2018-04-26 NOTE — Progress Notes (Signed)
Physical Therapy Treatment Patient Details Name: Rachel Chaney MRN: [id number] DOB: 11/14/1960 Today's Date: 04/26/2018    History of Present Illness Pt is a 58 y.o. female admitted 04/24/18 with dehiscence of R foot 2nd and 3rd ray amputation (s/p 02/24/18 with prolonged hospitalization); now s/p R transmet amputation with wound vac placement 4/15. PMH includes ESRD (HD MWF), HTN, DM, CKD, CHF.    PT Comments    Pt requiring mod assist for mobility this session, +2 for safety, and frequent verbal reminders to maintain NWB status. Pt with poor carryover of precautions into mobility, and lacks safety awareness with all mobility tasks. PT recommended SNF to pt today due to physical deficits and poor safety awareness, but pt refuses and states "I am going home". Pt lives with husband and child. Since pt is refusing SNF placement, PT recommending HHPT as a secondary option to address pt's deficits. Pt plans to d/c home today.   Follow Up Recommendations  SNF;Supervision/Assistance - 24 hour     Equipment Recommendations  None recommended by PT    Recommendations for Other Services       Precautions / Restrictions Precautions Precautions: Fall Restrictions Weight Bearing Restrictions: Yes RLE Weight Bearing: Non weight bearing    Mobility  Bed Mobility Overal bed mobility: Needs Assistance Bed Mobility: Supine to Sit     Supine to sit: Supervision;HOB elevated     General bed mobility comments: Supervision for safety, quick and unsteady scooting.   Transfers Overall transfer level: Needs assistance Equipment used: Rolling walker (2 wheeled) Transfers: Sit to/from [facility] Sit to Stand: Mod assist;+2 safety/equipment Stand pivot transfers: Min assist;+2 safety/equipment       General transfer comment: Mod assist for power up, steadying assist. Pt with heavy L lateral leaning in standing, pt unaware she was leaning against PT until pointed out by PT. Pt  attempted to take a hop-to step forward and LLE buckled, pt and PT-corrected. Pt performed stand pivot to recliner at bedside with min assist for steadying and guiding to recliner. PT reinforced NWB RLE status, not followed by pt.  Ambulation/Gait                 Stairs             Wheelchair Mobility    Modified Rankin (Stroke Patients Only)       Balance Overall balance assessment: Needs assistance   Sitting balance-Leahy Scale: Fair   Postural control: Left lateral lean   Standing balance-Leahy Scale: Poor Standing balance comment: Heavy reliance on BUE support                            Cognition Arousal/Alertness: Awake/alert Behavior During Therapy: Flat affect;Impulsive Overall Cognitive Status: Impaired/Different from baseline Area of Impairment: Attention;Memory;Following commands;Safety/judgement                   Current Attention Level: Selective Memory: Decreased short-term memory;Decreased recall of precautions Following Commands: Follows one step commands consistently Safety/Judgement: Decreased awareness of deficits;Decreased awareness of safety     General Comments: Pt with impulsivity during mobility, trying to move quickly and unsafely. PT providing multiple safety cues during session. Pt unable to maintain NWB status of RLE even with max reinforcement from PT.       Exercises General Exercises - Lower Extremity Ankle Circles/Pumps: AROM;Both;5 reps;Seated Quad Sets: AROM;Both;5 reps;Seated Hip ABduction/ADduction: AROM;Both;5 reps;Seated Straight Leg Raises: AROM;Both;5 reps;Seated  General Comments        Pertinent Vitals/Pain Pain Assessment: Faces Faces Pain Scale: Hurts a little bit Pain Location: R foot Pain Descriptors / Indicators: Sore Pain Intervention(s): Monitored during session;Repositioned;Limited activity within patient's tolerance    Home Living                      Prior Function             PT Goals (current goals can now be found in the care plan section) Acute Rehab PT Goals Patient Stated Goal: Return home with continued support from family PT Goal Formulation: With patient Time For Goal Achievement: 05/09/18 Potential to Achieve Goals: Good Progress towards PT goals: Progressing toward goals    Frequency    Min 5X/week      PT Plan Discharge plan needs to be updated    Co-evaluation              AM-PAC PT "6 Clicks" Mobility   Outcome Measure  Help needed turning from your back to your side while in a flat bed without using bedrails?: None Help needed moving from lying on your back to sitting on the side of a flat bed without using bedrails?: A Little Help needed moving to and from a bed to a chair (including a wheelchair)?: A Lot Help needed standing up from a chair using your arms (e.g., wheelchair or bedside chair)?: A Lot Help needed to walk in hospital room?: A Lot Help needed climbing 3-5 steps with a railing? : Total 6 Click Score: 14    End of Session Equipment Utilized During Treatment: Gait belt;Oxygen(on 2LO2 during session, sats 90-92% with mobility) Activity Tolerance: Patient limited by fatigue Patient left: in chair;with call bell/phone within reach(no chair alarm in chair, pt states she does not mobilize without pressing call button and waiting for RN/NT assist) Nurse Communication: Mobility status PT Visit Diagnosis: Other abnormalities of gait and mobility (R26.89);Muscle weakness (generalized) (M62.81);Unsteadiness on feet (R26.81)     Time: 3009-2330 PT Time Calculation (min) (ACUTE ONLY): 15 min  Charges:  $Therapeutic Activity: 8-22 mins                     Kolyn Rozario Conception Chancy, PT Acute Rehabilitation Services Pager [phone]  Office [phone]    Kevyn Boquet D Turner Baillie 04/26/2018, 11:20 AM

## 2018-04-26 NOTE — Discharge Summary (Signed)
Discharge Diagnoses:  Active Problems:   Wound dehiscence, surgical   S/P transmetatarsal amputation of foot, right (HCC)   Surgeries: Procedure(s): RIGHT TRANSMETATARSAL AMPUTATION, PLACE VAC on 04/24/2018    Consultants: Treatment Team:  Roney Jaffe, MD  Discharged Condition: Improved  Hospital Course: Rachel Chaney is an 58 y.o. female who was admitted 04/24/2018 with a chief complaint of wound breakdown of right foot, with a final diagnosis of Dehiscence Right Foot 2nd/3rd Toe Amputation.  Patient was brought to the operating room on 04/24/2018 and underwent Procedure(s): RIGHT TRANSMETATARSAL AMPUTATION, PLACE VAC.    Patient was given perioperative antibiotics:  Anti-infectives (From admission, onward)   Start     Dose/Rate Route Frequency Ordered Stop   04/25/18 1000  ceFAZolin (ANCEF) IVPB 2g/100 mL premix     2 g 200 mL/hr over 30 Minutes Intravenous  Once 04/24/18 1532 04/25/18 1834   04/24/18 0800  ceFAZolin (ANCEF) IVPB 2g/100 mL premix     2 g 200 mL/hr over 30 Minutes Intravenous On call to O.R. 04/24/18 0745 04/24/18 1058    .  Patient was given sequential compression devices, early ambulation, and aspirin for DVT prophylaxis.  Recent vital signs:  Patient Vitals for the past 24 hrs:  BP Temp Temp src Pulse Resp SpO2 Weight  04/26/18 0426 110/80 (!) 97.3 F (36.3 C) Oral 75 16 98 % -  04/25/18 1957 117/62 98.1 F (36.7 C) Oral 75 18 100 % -  04/25/18 1659 137/65 98 F (36.7 C) Oral 79 16 99 % -  04/25/18 1600 138/67 - - - - - -  04/25/18 1557 (!) 120/58 98 F (36.7 C) Oral 78 18 98 % 97.2 kg  04/25/18 1530 121/67 - - 76 - - -  04/25/18 1500 120/64 - - 74 - - -  04/25/18 1430 (!) 105/51 - - 74 - - -  04/25/18 1400 140/74 - - 78 - - -  04/25/18 1330 109/67 - - - - - -  04/25/18 1300 129/75 - - 74 - - -  04/25/18 1230 125/71 - - 73 - - -  04/25/18 1200 (!) 145/78 - - 71 - - -  04/25/18 1153 (!) 147/76 - - 72 - - -  04/25/18 1144 134/71 97.8 F  (36.6 C) Oral 75 - - 100.3 kg  04/25/18 1053 (!) 148/80 97.7 F (36.5 C) Oral 73 20 99 % -  04/25/18 0808 140/67 - - 75 - - -  .  Recent laboratory studies: No results found.  Discharge Medications:   Allergies as of 04/26/2018   No Known Allergies     Medication List    STOP taking these medications   diclofenac sodium 1 % Gel Commonly known as:  VOLTAREN   doxycycline 100 MG capsule Commonly known as:  VIBRAMYCIN   silver sulfADIAZINE 1 % cream Commonly known as:  Silvadene     TAKE these medications   acetaminophen 500 MG tablet Commonly known as:  TYLENOL Take 1,000 mg by mouth every 6 (six) hours as needed for pain.   atorvastatin 80 MG tablet Commonly known as:  LIPITOR Take 1 tablet (80 mg total) by mouth daily. What changed:  when to take this   carvedilol 25 MG tablet Commonly known as:  COREG Take 25 mg by mouth 2 (two) times daily.   cetirizine 10 MG tablet Commonly known as:  ZYRTEC Take 10 mg by mouth daily as needed for allergies.   cyclobenzaprine 10 MG tablet  Commonly known as:  FLEXERIL Take 1 tablet (10 mg total) by mouth at bedtime.   feeding supplement (PRO-STAT SUGAR FREE 64) Liqd Take 30 mLs by mouth 2 (two) times daily.   ferric citrate 1 GM 210 MG(Fe) tablet Commonly known as:  Auryxia Take 2 tablets (420 mg total) by mouth 3 (three) times daily with meals. Give two (420 mg) by mouth three times a day wih meals for ESRD What changed:    how much to take  when to take this  additional instructions   fluticasone 50 MCG/ACT nasal spray Commonly known as:  FLONASE Place 1 spray into both nostrils daily. 1 spray in each nostril every day What changed:    when to take this  reasons to take this  additional instructions   glucose blood test strip Commonly known as:  ONE TOUCH ULTRA TEST 1 each by Other route 3 (three) times daily. ICD-10 code: E11.40.   hydrALAZINE 50 MG tablet Commonly known as:  APRESOLINE Take 50 mg by  mouth 3 (three) times daily.   hydrOXYzine 50 MG tablet Commonly known as:  ATARAX/VISTARIL Take 1 tablet (50 mg total) by mouth 2 (two) times daily as needed for itching.   insulin detemir 100 UNIT/ML injection Commonly known as:  LEVEMIR Inject 0.3 mLs (30 Units total) into the skin 2 (two) times daily. What changed:    how much to take  additional instructions   Insulin Pen Needle 31G X 5 MM Misc Commonly known as:  B-D UF III MINI PEN NEEDLES USE AS DIRECTED FOR INSULIN INJECTION TWICE DAILY   Insulin Pen Needle 29G X 12MM Misc Inject 1 pen into the skin 2 (two) times daily. Check blood sugar daily.   Insulin Syringe-Needle U-100 30G X 5/16" 1 ML Misc Commonly known as:  UltiCare Insulin Syringe USE AS DIRECTED   midodrine 10 MG tablet Commonly known as:  PROAMATINE Take 1 tablet (10 mg total) by mouth 3 (three) times daily with meals.   ONE TOUCH ULTRA 2 w/Device Kit 1 kit by Does not apply route 3 (three) times daily. ICD-10 code: E11.40   glucose monitoring kit monitoring kit 1 each by Does not apply route as needed for other.   OneTouch Delica Lancets 82N Misc 1 Device by Does not apply route as needed (to check blood glucose).   pantoprazole 40 MG tablet Commonly known as:  PROTONIX Take 1 tablet (40 mg total) by mouth daily.   pentoxifylline 400 MG CR tablet Commonly known as:  TRENTAL TAKE 1 TABLET(400 MG) BY MOUTH THREE TIMES DAILY WITH MEALS       Diagnostic Studies: Dg Chest Port 1 View  Result Date: 03/29/2018 CLINICAL DATA:  58 year old female missed dialysis earlier this week. Shortness of breath. EXAM: PORTABLE CHEST 1 VIEW COMPARISON:  03/10/2018 and earlier. FINDINGS: Portable AP semi upright view at 0853 hours. Stable cardiac size and mediastinal contours. Visualized tracheal air column is within normal limits. No pneumothorax. No pleural effusion is evident. Streaky and patchy bibasilar opacity and pulmonary vascular congestion appearing very  similar to the 03/10/2018 exam. Paucity bowel gas in the upper abdomen. No acute osseous abnormality identified. IMPRESSION: Abnormal pulmonary opacity appearing very similar to the 03/10/2018 portable exam and favored to reflect pulmonary vascular congestion with basilar atelectasis. Electronically Signed   By: Genevie Ann M.D.   On: 03/29/2018 09:15    Patient benefited maximally from their hospital stay and there were no complications.  Disposition: Discharge disposition: 01-Home or Self Care      Discharge Instructions    Call MD / Call 911   Complete by:  As directed    If you experience chest pain or shortness of breath, CALL 911 and be transported to the hospital emergency room.  If you develope a fever above 101 F, pus (white drainage) or increased drainage or redness at the wound, or calf pain, call your surgeon's office.   Constipation Prevention   Complete by:  As directed    Drink plenty of fluids.  Prune juice may be helpful.  You may use a stool softener, such as Colace (over the counter) 100 mg twice a day.  Use MiraLax (over the counter) for constipation as needed.   Diet - low sodium heart healthy   Complete by:  As directed    Increase activity slowly as tolerated   Complete by:  As directed      Follow-up Information    [name], MD In 1 week.   Specialty:  Orthopedic Surgery Contact information: [place] [address] [phone]            Signed: Ulysees Barns 04/26/2018, 7:39 AM  [facility] orthopedic [phone]

## 2018-04-26 NOTE — Plan of Care (Signed)
  Problem: Pain Managment: Goal: General experience of comfort will improve Outcome: Progressing   

## 2018-04-26 NOTE — Progress Notes (Signed)
Dialysis Coordinator notified OP HD of patient's planned hospital discharge to ensure continuity of care.  Alphonzo Cruise Dialysis Coordinator [phone]

## 2018-04-26 NOTE — Progress Notes (Signed)
Subjective: 2 Days Post-Op Procedure(s) (LRB): RIGHT TRANSMETATARSAL AMPUTATION, PLACE VAC (Right) Patient reports pain as mild.    Objective: Vital signs in last 24 hours: Temp:  [97.3 F (36.3 C)-98.1 F (36.7 C)] 97.3 F (36.3 C) (04/17 0426) Pulse Rate:  [71-79] 75 (04/17 0426) Resp:  [16-20] 16 (04/17 0426) BP: (105-148)/(51-80) 110/80 (04/17 0426) SpO2:  [98 %-100 %] 98 % (04/17 0426) Weight:  [97.2 kg-100.3 kg] 97.2 kg (04/16 1557)  Intake/Output from previous day: 04/16 0701 - 04/17 0700 In: 230 [P.O.:230] Out: 2507  Intake/Output this shift: No intake/output data recorded.  Recent Labs    04/24/18 0831 04/25/18 0956  HGB 11.2* 10.8*   Recent Labs    04/24/18 0831 04/25/18 0956  WBC  --  18.1*  RBC  --  3.41*  HCT 33.0* 36.1  PLT  --  323   Recent Labs    04/24/18 0831 04/25/18 0956  NA 141 134*  K 3.9 4.9  CL  --  98  CO2  --  19*  BUN  --  63*  CREATININE  --  10.37*  GLUCOSE 65* 303*  CALCIUM  --  8.0*   No results for input(s): LABPT, INR in the last 72 hours.  The VAC is in place and functioning well over the right transmetatarsal amputation site. Scant VAC drainage.    Assessment/Plan: 2 Days Post-Op Procedure(s) (LRB): RIGHT TRANSMETATARSAL AMPUTATION, PLACE VAC (Right)  DC home today with Prevena VAC Follow up in office next week.  Non weight bearing right foot as much as possible and elevated as much as possible.    Erlinda Hong, PA-C 04/26/2018, 7:32 AM  [facility] [phone]

## 2018-04-26 NOTE — Discharge Instructions (Signed)
Keep right foot elevated higher than the level of your heart as much as possible.  Do not walk on the right foot as much as possible.  Keep the [facility] machine plugged into a wall outlet as much as possible to keep it charged.  Follow up in the office next Wednesday or Thursday. Call the office to make your appointment. [phone]

## 2018-04-26 NOTE — Progress Notes (Signed)
Discharge instructions completed with pt.  Pt verbalized understanding of the information.  Pt denies chest pain, shortness of breath, dizziness, lightheadedness, and n/v.  Pt's IV discontinued.  Pt waiting for PTAR to be discharged home.

## 2018-04-26 NOTE — Progress Notes (Addendum)
0800-Patient blood glucose was 27 per nurse tech.  Pt given a can of cola and some graham crackers.  Pt's blood sugar reassessed 15 mins after.  Pt's blood sugar was 37.  Pt given 4 mg glucose tablet.  Pt's blood sugar reassessed at 0856 and it was 56.  Pt encouraged to eat breakfast and that we will recheck it after she eats.  0350- pt's blood sugar assessed. Pt's blood sugar is 84.

## 2018-04-30 ENCOUNTER — Telehealth: Payer: Self-pay

## 2018-04-30 ENCOUNTER — Encounter: Payer: Self-pay | Admitting: Cardiology

## 2018-04-30 ENCOUNTER — Telehealth (INDEPENDENT_AMBULATORY_CARE_PROVIDER_SITE_OTHER): Payer: Self-pay

## 2018-04-30 ENCOUNTER — Telehealth (INDEPENDENT_AMBULATORY_CARE_PROVIDER_SITE_OTHER): Payer: Medicare Other | Admitting: Cardiology

## 2018-04-30 DIAGNOSIS — I469 Cardiac arrest, cause unspecified: Secondary | ICD-10-CM | POA: Diagnosis not present

## 2018-04-30 DIAGNOSIS — I10 Essential (primary) hypertension: Secondary | ICD-10-CM

## 2018-04-30 DIAGNOSIS — I480 Paroxysmal atrial fibrillation: Secondary | ICD-10-CM

## 2018-04-30 DIAGNOSIS — N186 End stage renal disease: Secondary | ICD-10-CM | POA: Diagnosis not present

## 2018-04-30 DIAGNOSIS — Z992 Dependence on renal dialysis: Secondary | ICD-10-CM

## 2018-04-30 MED ORDER — ASPIRIN EC 81 MG PO TBEC: 81.0000 mg | DELAYED_RELEASE_TABLET | Freq: Every day | ORAL | 3 refills | Status: AC

## 2018-04-30 NOTE — Patient Instructions (Signed)
Medication Instructions:  START Aspirin 81mg  Take 1 tablet once a day If you need a refill on your cardiac medications before your next appointment, please call your pharmacy.   Lab work: None   If you have labs (blood work) drawn today and your tests are completely normal, you will receive your results only by: Marland Kitchen MyChart Message (if you have MyChart) OR . A paper copy in the mail If you have any lab test that is abnormal or we need to change your treatment, we will call you to review the results.  Testing/Procedures: None   Follow-Up: At [facility], you and your health needs are our priority.  As part of our continuing mission to provide you with exceptional heart care, we have created designated Provider Care Teams.  These Care Teams include your primary Cardiologist (physician) and Advanced Practice Providers (APPs -  Physician Assistants and Nurse Practitioners) who all work together to provide you with the care you need, when you need it. You will need a follow up appointment in 2 months.  You may see Rachel Ruths, MD or one of the following Advanced Practice Providers on your designated Care Team:   Kerin Ransom, PA-C Roby Lofts, [place] . Sande Rives, PA-C  Any Other Special Instructions Will Be Listed Below (If Applicable).

## 2018-04-30 NOTE — Telephone Encounter (Signed)
Called patient to start evisit, patient did not answer and her mailbox was full. Will try again later.

## 2018-04-30 NOTE — Telephone Encounter (Signed)
Truman Hayward from [place] called to let us know patient has refused PT until she sees Dr. Sharol Given. Truman Hayward would like for [place] to call patient to see if we can talk to her and so they can continue going out to see patient. Truman Hayward would like for [place] to call her back to see what patient said.    Lee QT [phone]  [id number] FJ [phone]

## 2018-04-30 NOTE — Progress Notes (Signed)
Virtual Visit via Telephone Note   This visit type was conducted due to national recommendations for restrictions regarding the COVID-19 Pandemic (e.g. social distancing) in an effort to limit this patient's exposure and mitigate transmission in our community.  Due to her co-morbid illnesses, this patient is at least at moderate risk for complications without adequate follow up.  This format is felt to be most appropriate for this patient at this time.  The patient did not have access to video technology/had technical difficulties with video requiring transitioning to audio format only (telephone).  All issues noted in this document were discussed and addressed.  No physical exam could be performed with this format.  Please refer to the patient's chart for her  consent to telehealth for [facility].  Evaluation Performed:  Follow-up visit  This visit type was conducted due to national recommendations for restrictions regarding the COVID-19 Pandemic (e.g. social distancing).  This format is felt to be most appropriate for this patient at this time.  All issues noted in this document were discussed and addressed.  No physical exam was performed (except for noted visual exam findings with Video Visits).  Please refer to the patient's chart (MyChart message for video visits and phone note for telephone visits) for the patient's consent to telehealth for [facility].  Date:  04/30/2018   ID:  Early Chars, DOB 12/08/1960, MRN [id number]  Patient Location[place] [address]   Provider location:   [place]  PCP:  Bufford Lope, DO  Cardiologist:  Kirk Ruths, MD  Electrophysiologist:  None   Chief Complaint:  Post hospital visit  History of Present Illness:    Rachel Chaney is a 58 y.o. female who presents via audio/video conferencing for a telehealth visit today.    The patient is a 58 year old female with a history of IDDM and ESRD on HD  who was admitted February 25 2018 with a crush injury to her right foot.  On 02/27/2018 she underwent right foot 2nd and 3nd metatarsal ray amputation.  On 2//20/20 she had a cardiac arrest while on HD.  She received 6 minutes of CPR before ROSC was obtained.  She was intubated and transferred to ICU.  Echo showed and EF > 76% with diastolic dysfunction. RV pressures were elevated.  She did have a superficial upper extremity thrombus noted on doppler but ultimately a CTA scan on 2/27 did not show a PE.  She did have bilateral lower lobe pneumonia noted as well as rib fractures.   On 03/10/2018 cardiology was consulted for PAF. The patient had documented a brief run that lasted 5 minutes, then converted back to NSR.  Dr Stanford Breed felt this was in the setting of an acute illness s/p cardiac arrest with complications of pneumonia and hypotension.  Though she was a CHADS VASC=4 he did not want to commit the patient to long term anticoagulation based on this one episode. When her B/P stabilized a beta blocker was added. She was discharged to SNF 3/5 (?), and then discharged to home 03/28/2018.  She was readmitted 04/24/2018 for revision of her Rt MT amputation and placement of a wound vac.  After the episode of PAF on 03/10/2018 there were no further episodes documented and cardiology did not see her after 03/12/2018.    She was contacted today for post hospital follow up.  She says she is doing well, no palpitations or tachycardia.  She is tolerating Coreg 25  mg BID.  She asked about taking a baby aspirin as she was on this in the hospital and told her it would be a good idea to continue this.    The patient does not symptoms concerning for COVID-19 infection (fever, chills, cough, or new SHORTNESS OF BREATH).    Prior CV studies:   The following studies were reviewed today:  Past Medical History:  Diagnosis Date  . Cardiac arrest ([place]) 02/28/2018   while on hemodialysis in hospital  . CHF (congestive heart  failure) ([place])   . Chronic kidney disease due to diabetes mellitus ([place]) 10/13/2013  . Diabetes mellitus    Type II  . ESRD (end stage renal disease) ([place])    MWF - [facility]  . Hyperlipidemia   . Hypertension    Past Surgical History:  Procedure Laterality Date  . AMPUTATION Right 02/27/2018   Procedure: RIGHT FOOT 2ND AND 3RD RAY AMPUTATION;  Surgeon: [name], MD;  Location: [facility];  Service: Orthopedics;  Laterality: Right;  . AMPUTATION Right 04/24/2018   Procedure: RIGHT TRANSMETATARSAL AMPUTATION, PLACE VAC;  Surgeon: [name], MD;  Location: [facility];  Service: Orthopedics;  Laterality: Right;  . AV FISTULA PLACEMENT Left 04/15/2015   Procedure: ARTERIOVENOUS (AV) FISTULA CREATION;  Surgeon: Mal Misty, MD;  Location: [facility];  Service: Vascular;  Laterality: Left;  . EYE SURGERY Right    retinal detachment  . FISTULA SUPERFICIALIZATION Left 06/03/2015   Procedure: LEFT UPPER ARM BRACHIOCEPHALIC FISTULA SUPERFICIALIZATION;  Surgeon: Mal Misty, MD;  Location: [facility];  Service: Vascular;  Laterality: Left;  from MAC to General     Current Meds  Medication Sig  . acetaminophen (TYLENOL) 500 MG tablet Take 1,000 mg by mouth every 6 (six) hours as needed for pain.  . Amino Acids-Protein Hydrolys (FEEDING SUPPLEMENT, PRO-STAT SUGAR FREE 64,) LIQD Take 30 mLs by mouth 2 (two) times daily.  Marland Kitchen atorvastatin (LIPITOR) 80 MG tablet Take 1 tablet (80 mg total) by mouth daily. (Patient taking differently: Take 80 mg by mouth at bedtime. )  . Blood Glucose Monitoring Suppl (ONE TOUCH ULTRA 2) w/Device KIT 1 kit by Does not apply route 3 (three) times daily. ICD-10 code: E11.40  . carvedilol (COREG) 25 MG tablet Take 25 mg by mouth 2 (two) times daily.  . cetirizine (ZYRTEC) 10 MG tablet Take 10 mg by mouth daily as needed for allergies.  . cyclobenzaprine (FLEXERIL) 10 MG tablet Take 1 tablet (10 mg total) by mouth at bedtime.  . ferric citrate (AURYXIA) 1 GM 210 MG(Fe)  tablet Take 2 tablets (420 mg total) by mouth 3 (three) times daily with meals. Give two (420 mg) by mouth three times a day wih meals for ESRD (Patient taking differently: Take 210 mg by mouth See admin instructions. Take 210 mg with each meal and large snacks)  . fluticasone (FLONASE) 50 MCG/ACT nasal spray Place 1 spray into both nostrils daily. 1 spray in each nostril every day (Patient taking differently: Place 1 spray into both nostrils daily as needed for allergies. )  . glucose blood (ONE TOUCH ULTRA TEST) test strip 1 each by Other route 3 (three) times daily. ICD-10 code: E11.40.  . hydrALAZINE (APRESOLINE) 50 MG tablet Take 50 mg by mouth 3 (three) times daily.  . hydrOXYzine (ATARAX/VISTARIL) 50 MG tablet Take 1 tablet (50 mg total) by mouth 2 (two) times daily as needed for itching.  . insulin detemir (LEVEMIR) 100 UNIT/ML injection  Inject 0.3 mLs (30 Units total) into the skin 2 (two) times daily. (Patient taking differently: Inject into the skin 2 (two) times daily. Patient states she is taking 50 units in the morning and 40 units at night)  . Insulin Pen Needle (B-D UF III MINI PEN NEEDLES) 31G X 5 MM MISC USE AS DIRECTED FOR INSULIN INJECTION TWICE DAILY  . Insulin Pen Needle 29G X 12MM MISC Inject 1 pen into the skin 2 (two) times daily. Check blood sugar daily.  . Insulin Syringe-Needle U-100 (ULTICARE INSULIN SYRINGE) 30G X 5/16" 1 ML MISC USE AS DIRECTED  . OneTouch Delica Lancets 16X MISC 1 Device by Does not apply route as needed (to check blood glucose).  . pantoprazole (PROTONIX) 40 MG tablet Take 1 tablet (40 mg total) by mouth daily.     Allergies:   Patient has no known allergies.   Social History   Tobacco Use  . Smoking status: Never Smoker  . Smokeless tobacco: Never Used  Substance Use Topics  . Alcohol use: No    Alcohol/week: 0.0 standard drinks  . Drug use: No     Family Hx: The patient's family history includes Heart disease in her father and mother.   ROS:   Please see the history of present illness.    All other systems reviewed and are negative.   Labs/Other Tests and Data Reviewed:    Recent Labs: 02/28/2018: ALT 96 03/06/2018: TSH 0.874 03/07/2018: Magnesium 2.2 03/08/2018: B Natriuretic Peptide 2,084.7 04/25/2018: BUN 63; Creatinine, Ser 10.37; Hemoglobin 10.8; Platelets 323; Potassium 4.9; Sodium 134   Recent Lipid Panel Lab Results  Component Value Date/Time   CHOL 94 (L) 04/12/2017 09:00 AM   TRIG 90 04/12/2017 09:00 AM   HDL 32 (L) 04/12/2017 09:00 AM   CHOLHDL 2.9 04/12/2017 09:00 AM   CHOLHDL 4.3 12/07/2015 02:17 PM   LDLCALC 44 04/12/2017 09:00 AM   LDLDIRECT 42 04/12/2017 09:00 AM   LDLDIRECT 112 10/26/2014 09:50 AM    Wt Readings from Last 3 Encounters:  04/30/18 185 lb (83.9 kg)  04/26/18 218 lb 4.1 oz (99 kg)  04/18/18 216 lb (98 kg)     Exam:    Vital Signs:  BP 120/60   Ht _0  (1.626 m)   Wt 185 lb (83.9 kg)   LMP 09/18/2011   BMI 31.76 kg/m    Pt was in no acute distress on the phone.  ASSESSMENT & PLAN:    PAF- One episode that lasted 5 minutes several days s/p cardiac arrest, in the setting of respiratory failure, pneumonia, and hypotension.  No recurrence documented or noted by the patient.  Anticoagulation was not recommended unless she has recurrent episodes.   Cardiac arrest- Cardiac arrest with 6 minutes of CPR on 02/28/2018- etiology not clear.  LVF > 65% by echo with diastolic dysfunction. High dose statin added.  ESRD- On HD  HTN- Tolerating Coreg 25 mg BID- B/P controlled  IDDM- Per PCP    COVID-19 Education: The signs and symptoms of COVID-19 were discussed with the patient and how to seek care for testing (follow up with PCP or arrange E-visit).  The importance of social distancing was discussed today.  Patient Risk:   After full review of this patients clinical status, I feel that they are at least moderate risk at this time.  Time:   Today, I have spent 15 minutes  with the patient with telehealth technology discussing atrial fibrilation.     Medication  Adjustments/Labs and Tests Ordered: Current medicines are reviewed at length with the patient today.  Concerns regarding medicines are outlined above.  Tests Ordered: No orders of the defined types were placed in this encounter.  Medication Changes: No orders of the defined types were placed in this encounter.   Disposition:  I suggested she add back ASA 81 mg daily.  F/U with Dr Stanford Breed in 2 months  Signed, Kerin Ransom, PA-C  04/30/2018 2:15 PM    [place]

## 2018-04-30 NOTE — Telephone Encounter (Signed)
Spoke with patient and gave AVS instructions. Offered to schedule follow up appt with Dr Stanford Breed. Patient declined appt and stated she would call the office back at a later date to schedule follow up appt.

## 2018-05-02 ENCOUNTER — Other Ambulatory Visit: Payer: Self-pay

## 2018-05-02 ENCOUNTER — Ambulatory Visit (INDEPENDENT_AMBULATORY_CARE_PROVIDER_SITE_OTHER): Payer: Medicare Other | Admitting: Orthopedic Surgery

## 2018-05-02 ENCOUNTER — Encounter (INDEPENDENT_AMBULATORY_CARE_PROVIDER_SITE_OTHER): Payer: Self-pay | Admitting: Orthopedic Surgery

## 2018-05-02 VITALS — Ht 64.0 in | Wt 185.0 lb

## 2018-05-02 DIAGNOSIS — E1140 Type 2 diabetes mellitus with diabetic neuropathy, unspecified: Secondary | ICD-10-CM

## 2018-05-02 DIAGNOSIS — E43 Unspecified severe protein-calorie malnutrition: Secondary | ICD-10-CM

## 2018-05-02 DIAGNOSIS — N186 End stage renal disease: Secondary | ICD-10-CM

## 2018-05-02 DIAGNOSIS — Z89431 Acquired absence of right foot: Secondary | ICD-10-CM

## 2018-05-02 DIAGNOSIS — Z992 Dependence on renal dialysis: Secondary | ICD-10-CM

## 2018-05-02 DIAGNOSIS — Z794 Long term (current) use of insulin: Secondary | ICD-10-CM

## 2018-05-02 NOTE — Progress Notes (Signed)
Office Visit Note   Patient: Rachel Chaney           Date of Birth: 25-Dec-1960           MRN: [id number] Visit Date: 05/02/2018              Requested by: Bufford Lope, DO [address] [place], [place] [address] PCP: Bufford Lope, DO  Chief Complaint  Patient presents with  . Right Foot - Routine Post Op    04/24/2018 right transmet amputation       HPI: Patient is a 58 year old woman who is just about a week out from a right transmetatarsal amputation.  She presents to have the wound VAC removed.  By report patient has refused physical therapy visits from [facility].  She is currently on nasal cannula FiO2.  Assessment & Plan: Visit Diagnoses:  1. Type 2 diabetes mellitus with diabetic neuropathy, with long-term current use of insulin ([place])   2. ESRD on dialysis ([place])   3. History of transmetatarsal amputation of right foot ([place])     Plan: Discussed the importance of minimizing weightbearing she may begin washing the wound with soap and water apply a dry dressing.  Possible suture harvest in a week or 2.  Discussed the importance of protein nutrition supplements.  Follow-Up Instructions: Return in about 1 week (around 05/09/2018).   Ortho Exam  Patient is alert, oriented, no adenopathy, well-dressed, normal affect, normal respiratory effort. Examination there is some maceration around the surgical incision there is no necrosis there is a small amount of serosanguineous drainage no abscess no cellulitis.  Hemoglobin A1c 11.4 albumin 2.5.  Imaging: No results found. No images are attached to the encounter.  Labs: Lab Results  Component Value Date   HGBA1C 11.4 (H) 02/26/2018   HGBA1C 9.1 (A) 08/23/2017   HGBA1C 8.6 04/12/2017   ESRSEDRATE 58 (H) 04/12/2015   ESRSEDRATE 85 (H) 09/11/2013   ESRSEDRATE 77 (H) 10/16/2011   CRP 0.6 04/12/2015   REPTSTATUS 03/15/2018 FINAL 03/10/2018   GRAMSTAIN  02/27/2018    ABUNDANT WBC PRESENT, PREDOMINANTLY PMN  ABUNDANT GRAM POSITIVE COCCI    CULT  03/10/2018    NO GROWTH 5 DAYS Performed at [facility], [place] [address], [place], [place] [address]    LABORGA STAPHYLOCOCCUS AUREUS 02/27/2018     Lab Results  Component Value Date   ALBUMIN 2.5 (L) 04/25/2018   ALBUMIN 2.3 (L) 03/13/2018   ALBUMIN 2.2 (L) 03/11/2018    Body mass index is 31.76 kg/m.  Orders:  No orders of the defined types were placed in this encounter.  No orders of the defined types were placed in this encounter.    Procedures: No procedures performed  Clinical Data: No additional findings.  ROS:  All other systems negative, except as noted in the HPI. Review of Systems  Objective: Vital Signs: Ht _0  (1.626 m)   Wt 185 lb (83.9 kg)   LMP 09/18/2011   BMI 31.76 kg/m   Specialty Comments:  No specialty comments available.  PMFS History: Patient Active Problem List   Diagnosis Date Noted  . S/P transmetatarsal amputation of foot, right ([place]) 04/24/2018  . Wound dehiscence, surgical   . Cardiac arrest ([place]) 03/19/2018  . PAF (paroxysmal atrial fibrillation) ([place])   . Respiratory distress   . Pulmonary infiltrates   . Ventilator associated pneumonia ([place])   . Oxygen dependent   . Hypotension   . Respiratory  failure ([place])   . Type 2 diabetes mellitus with diabetic neuropathy ([place])   . Abscess of toe of right foot 02/26/2018  . Diabetes mellitus type 2, uncontrolled ([place]) 02/26/2018  . Toe infection   . Diabetic polyneuropathy associated with type 2 diabetes mellitus ([place])   . Osteomyelitis of second toe of right foot ([place])   . Severe protein-calorie malnutrition ([place])   . Open toe fracture 02/25/2018  . Seasonal allergies 04/12/2017  . Post-menopausal bleeding 12/07/2015  . ESRD on dialysis ([place]) 05/25/2015  . Hypoalbuminemia   . Abnormal appearance of cervix 08/08/2011  . Back pain 08/07/2011  . GERD (gastroesophageal reflux disease) 04/27/2010  . Hyperlipidemia 03/08/2006  .  Morbid obesity ([place]) 03/08/2006  . Essential hypertension with morbid obesity 03/08/2006   Past Medical History:  Diagnosis Date  . Cardiac arrest ([place]) 02/28/2018   while on hemodialysis in hospital  . CHF (congestive heart failure) ([place])   . Chronic kidney disease due to diabetes mellitus ([place]) 10/13/2013  . Diabetes mellitus    Type II  . ESRD (end stage renal disease) ([place])    MWF - [facility]  . Hyperlipidemia   . Hypertension     Family History  Problem Relation Age of Onset  . Heart disease Mother   . Heart disease Father     Past Surgical History:  Procedure Laterality Date  . AMPUTATION Right 02/27/2018   Procedure: RIGHT FOOT 2ND AND 3RD RAY AMPUTATION;  Surgeon: [name], MD;  Location: [facility];  Service: Orthopedics;  Laterality: Right;  . AMPUTATION Right 04/24/2018   Procedure: RIGHT TRANSMETATARSAL AMPUTATION, PLACE VAC;  Surgeon: [name], MD;  Location: [facility];  Service: Orthopedics;  Laterality: Right;  . AV FISTULA PLACEMENT Left 04/15/2015   Procedure: ARTERIOVENOUS (AV) FISTULA CREATION;  Surgeon: Mal Misty, MD;  Location: [facility];  Service: Vascular;  Laterality: Left;  . EYE SURGERY Right    retinal detachment  . FISTULA SUPERFICIALIZATION Left 06/03/2015   Procedure: LEFT UPPER ARM BRACHIOCEPHALIC FISTULA SUPERFICIALIZATION;  Surgeon: Mal Misty, MD;  Location: [facility];  Service: Vascular;  Laterality: Left;  from MAC to General   Social History   Occupational History  . Not on file  Tobacco Use  . Smoking status: Never Smoker  . Smokeless tobacco: Never Used  Substance and Sexual Activity  . Alcohol use: No    Alcohol/week: 0.0 standard drinks  . Drug use: No  . Sexual activity: Never

## 2018-05-03 NOTE — Telephone Encounter (Signed)
I called and advised that the pt is to be non weight bearing on the right and will follow up in the office in 1 week. They can reach out again to pt but if she does not want the physical therapy they can d/c the pt from service.

## 2018-05-06 ENCOUNTER — Ambulatory Visit (INDEPENDENT_AMBULATORY_CARE_PROVIDER_SITE_OTHER): Payer: Medicare Other | Admitting: Orthopedic Surgery

## 2018-05-07 ENCOUNTER — Ambulatory Visit (INDEPENDENT_AMBULATORY_CARE_PROVIDER_SITE_OTHER): Payer: Medicare Other | Admitting: Orthopedic Surgery

## 2018-05-07 ENCOUNTER — Ambulatory Visit (INDEPENDENT_AMBULATORY_CARE_PROVIDER_SITE_OTHER): Payer: Medicare Other | Admitting: Physician Assistant

## 2018-05-07 ENCOUNTER — Encounter (INDEPENDENT_AMBULATORY_CARE_PROVIDER_SITE_OTHER): Payer: Self-pay | Admitting: Orthopedic Surgery

## 2018-05-07 ENCOUNTER — Other Ambulatory Visit: Payer: Self-pay

## 2018-05-07 VITALS — Ht 64.0 in | Wt 185.0 lb

## 2018-05-07 DIAGNOSIS — E43 Unspecified severe protein-calorie malnutrition: Secondary | ICD-10-CM

## 2018-05-07 DIAGNOSIS — N186 End stage renal disease: Secondary | ICD-10-CM

## 2018-05-07 DIAGNOSIS — Z89431 Acquired absence of right foot: Secondary | ICD-10-CM

## 2018-05-07 DIAGNOSIS — Z992 Dependence on renal dialysis: Secondary | ICD-10-CM

## 2018-05-07 DIAGNOSIS — E1140 Type 2 diabetes mellitus with diabetic neuropathy, unspecified: Secondary | ICD-10-CM

## 2018-05-07 DIAGNOSIS — Z794 Long term (current) use of insulin: Secondary | ICD-10-CM

## 2018-05-07 NOTE — Progress Notes (Signed)
Office Visit Note   Patient: Rachel Chaney           Date of Birth: Jul 07, 1960           MRN: [id number] Visit Date: 05/07/2018              Requested by: Bufford Lope, DO [address] [place], [place] [address] PCP: Bufford Lope, DO  Chief Complaint  Patient presents with  . Right Foot - Routine Post Op    04/24/2018 right transmet      HPI: The patient is a 58 year old woman who is status post right transmetatarsal amputation on 04/24/2018.  She is 13 days postop.  She reports scant drainage from the area.  She says she had some intermittent aching pain but no significant discomfort.  Her sutures are intact.  Assessment & Plan: Visit Diagnoses:  1. History of transmetatarsal amputation of right foot ([place])   2. Type 2 diabetes mellitus with diabetic neuropathy, with long-term current use of insulin ([place])   3. ESRD on dialysis ([place])   4. Severe protein-calorie malnutrition ([place])     Plan: We will leave sutures in place for another couple of weeks.  Should continue to apply dry dressing after washing the area with soap and water daily and then Ace wrap for edema control.  She will follow-up in 2 weeks.  Follow-Up Instructions: Return in about 2 weeks (around 05/21/2018).   Ortho Exam  Patient is alert, oriented, no adenopathy, well-dressed, normal affect, normal respiratory effort. The right transmetatarsal amputation site has moderate edema.  No signs of cellulitis.  Scant drainage.  She does have weakly palpable pedal pulses.   Imaging: No results found.   Labs: Lab Results  Component Value Date   HGBA1C 11.4 (H) 02/26/2018   HGBA1C 9.1 (A) 08/23/2017   HGBA1C 8.6 04/12/2017   ESRSEDRATE 58 (H) 04/12/2015   ESRSEDRATE 85 (H) 09/11/2013   ESRSEDRATE 77 (H) 10/16/2011   CRP 0.6 04/12/2015   REPTSTATUS 03/15/2018 FINAL 03/10/2018   GRAMSTAIN  02/27/2018    ABUNDANT WBC PRESENT, PREDOMINANTLY PMN ABUNDANT GRAM POSITIVE COCCI    CULT  03/10/2018    NO GROWTH 5  DAYS Performed at [facility], [place] [address], [place], [place] [address]    LABORGA STAPHYLOCOCCUS AUREUS 02/27/2018     Lab Results  Component Value Date   ALBUMIN 2.5 (L) 04/25/2018   ALBUMIN 2.3 (L) 03/13/2018   ALBUMIN 2.2 (L) 03/11/2018    Body mass index is 31.76 kg/m.  Orders:  No orders of the defined types were placed in this encounter.  No orders of the defined types were placed in this encounter.    Procedures: No procedures performed  Clinical Data: No additional findings.  ROS:  All other systems negative, except as noted in the HPI. Review of Systems  Objective: Vital Signs: Ht _0  (1.626 m)   Wt 185 lb (83.9 kg)   LMP 09/18/2011   BMI 31.76 kg/m   Specialty Comments:  No specialty comments available.  PMFS History: Patient Active Problem List   Diagnosis Date Noted  . S/P transmetatarsal amputation of foot, right ([place]) 04/24/2018  . Wound dehiscence, surgical   . Cardiac arrest ([place]) 03/19/2018  . PAF (paroxysmal atrial fibrillation) ([place])   . Respiratory distress   . Pulmonary infiltrates   . Ventilator associated pneumonia ([place])   . Oxygen dependent   . Hypotension   . Respiratory failure ([place])   .  Type 2 diabetes mellitus with diabetic neuropathy ([place])   . Abscess of toe of right foot 02/26/2018  . Diabetes mellitus type 2, uncontrolled ([place]) 02/26/2018  . Toe infection   . Diabetic polyneuropathy associated with type 2 diabetes mellitus ([place])   . Osteomyelitis of second toe of right foot ([place])   . Severe protein-calorie malnutrition ([place])   . Open toe fracture 02/25/2018  . Seasonal allergies 04/12/2017  . Post-menopausal bleeding 12/07/2015  . ESRD on dialysis ([place]) 05/25/2015  . Hypoalbuminemia   . Abnormal appearance of cervix 08/08/2011  . Back pain 08/07/2011  . GERD (gastroesophageal reflux disease) 04/27/2010  . Hyperlipidemia 03/08/2006  . Morbid obesity ([place]) 03/08/2006  . Essential hypertension with  morbid obesity 03/08/2006   Past Medical History:  Diagnosis Date  . Cardiac arrest ([place]) 02/28/2018   while on hemodialysis in hospital  . CHF (congestive heart failure) ([place])   . Chronic kidney disease due to diabetes mellitus ([place]) 10/13/2013  . Diabetes mellitus    Type II  . ESRD (end stage renal disease) ([place])    MWF - [facility]  . Hyperlipidemia   . Hypertension     Family History  Problem Relation Age of Onset  . Heart disease Mother   . Heart disease Father     Past Surgical History:  Procedure Laterality Date  . AMPUTATION Right 02/27/2018   Procedure: RIGHT FOOT 2ND AND 3RD RAY AMPUTATION;  Surgeon: [name], MD;  Location: [facility];  Service: Orthopedics;  Laterality: Right;  . AMPUTATION Right 04/24/2018   Procedure: RIGHT TRANSMETATARSAL AMPUTATION, PLACE VAC;  Surgeon: [name], MD;  Location: [facility];  Service: Orthopedics;  Laterality: Right;  . AV FISTULA PLACEMENT Left 04/15/2015   Procedure: ARTERIOVENOUS (AV) FISTULA CREATION;  Surgeon: Mal Misty, MD;  Location: [facility];  Service: Vascular;  Laterality: Left;  . EYE SURGERY Right    retinal detachment  . FISTULA SUPERFICIALIZATION Left 06/03/2015   Procedure: LEFT UPPER ARM BRACHIOCEPHALIC FISTULA SUPERFICIALIZATION;  Surgeon: Mal Misty, MD;  Location: [facility];  Service: Vascular;  Laterality: Left;  from MAC to General   Social History   Occupational History  . Not on file  Tobacco Use  . Smoking status: Never Smoker  . Smokeless tobacco: Never Used  Substance and Sexual Activity  . Alcohol use: No    Alcohol/week: 0.0 standard drinks  . Drug use: No  . Sexual activity: Never

## 2018-05-09 ENCOUNTER — Telehealth (INDEPENDENT_AMBULATORY_CARE_PROVIDER_SITE_OTHER): Payer: Self-pay | Admitting: Orthopedic Surgery

## 2018-05-09 NOTE — Telephone Encounter (Signed)
I called and sw pt to advise that at the last visit she had been advised to wash foot with soap and water and apply gauze and an ace bandage. This should be changed daily. To call with any other questions. This is not a skilled need. Voiced understanding and will call with questions.

## 2018-05-09 NOTE — Telephone Encounter (Signed)
Patient states bandage has come undone on her foot.  She was last seen on Tuesday. She is asking if the nurse from [facility] can change it. Normally they come out 2x per week, but she hasn't seen the nurse since her last visit.   Please advise.  Pt's cb [phone]

## 2018-05-13 ENCOUNTER — Other Ambulatory Visit: Payer: Self-pay | Admitting: *Deleted

## 2018-05-13 DIAGNOSIS — E11649 Type 2 diabetes mellitus with hypoglycemia without coma: Secondary | ICD-10-CM

## 2018-05-13 MED ORDER — INSULIN DETEMIR 100 UNIT/ML ~~LOC~~ SOLN: SUBCUTANEOUS | 11 refills | Status: DC

## 2018-05-13 NOTE — Telephone Encounter (Signed)
Patient needs a refill of levemir.  The last refill she received has instructions as follows:  Sig - Route: Inject 0.3 mLs (30 Units total) into the skin 2 (two) times daily. - Subcutaneous   Patient taking differently: Inject into the skin 2 (two) times daily. Patient states she is taking 50 units in the morning and 40 units at night         Pharmacy filled as 30 units BID.  Pt needs a new script sent in as 50 units AM and 40 units PM if that is indeed what she is taking.  Will forward to PCP. Christen Bame, CMA

## 2018-05-13 NOTE — Telephone Encounter (Signed)
Refill sent. Patient needs virtual visit for diabetes follow up

## 2018-05-13 NOTE — Telephone Encounter (Signed)
appt made for 05-16-2018.  Rachel Chaney,CMA

## 2018-05-14 MED ORDER — INSULIN DETEMIR 100 UNIT/ML FLEXPEN: PEN_INJECTOR | SUBCUTANEOUS | 11 refills | Status: DC

## 2018-05-14 NOTE — Telephone Encounter (Signed)
Please inform pharmacy of switch to flexpens.

## 2018-05-14 NOTE — Addendum Note (Signed)
Addended by: Bufford Lope on: 05/14/2018 06:16 PM   Modules accepted: Orders

## 2018-05-14 NOTE — Telephone Encounter (Signed)
Pt calls nurse line.  She would like the levemir pen, not the vial. . Christen Bame, CMA

## 2018-05-15 ENCOUNTER — Telehealth: Payer: Self-pay

## 2018-05-15 NOTE — Telephone Encounter (Signed)
Shanon Brow from [facility] called and wanted to give Dr. Sharol Given an Juluis Rainier. Patient has been putting off care-- s/p right transmetatarsal amp. States he will see her tomorrow to resume care.   For questions or concerns please call him back at [phone]

## 2018-05-15 NOTE — Telephone Encounter (Signed)
Noted  

## 2018-05-16 ENCOUNTER — Telehealth (INDEPENDENT_AMBULATORY_CARE_PROVIDER_SITE_OTHER): Payer: Medicare Other | Admitting: Family Medicine

## 2018-05-16 ENCOUNTER — Telehealth: Payer: Self-pay

## 2018-05-16 ENCOUNTER — Other Ambulatory Visit: Payer: Self-pay

## 2018-05-16 DIAGNOSIS — Z794 Long term (current) use of insulin: Secondary | ICD-10-CM | POA: Diagnosis not present

## 2018-05-16 DIAGNOSIS — E1140 Type 2 diabetes mellitus with diabetic neuropathy, unspecified: Secondary | ICD-10-CM

## 2018-05-16 NOTE — Progress Notes (Signed)
[facility] Telemedicine Visit  Patient consented to have virtual visit. Method of visit: Telephone  Encounter participants: Patient: Rachel Chaney - located at home Provider: Bufford Lope - located at [facility] Others (if applicable): none  Chief Complaint: diabetes f/u  HPI:  Taking levemir 50U in the monreing and 40U at night. She made this change from 50U BID about 1 week ago. Usually checks her sugars in the morning and around 2-3pm. She only checks her sugars only sporadically and at most every other day. Her last sugar readings over the last week have been 100, 140, 240. No symptomatic lows.  No polyuria or polydipsia.  Her foot amputation site is healing well   ROS: per HPI  Pertinent PMHx: DM, ESRD, recent R transmetatarsal amputation  Exam:  Respiratory: normal work of breathing, speaks in full sentences   Assessment/Plan:  Type 2 diabetes mellitus with diabetic neuropathy ([place]) Uncontrolled based on last A1c.  However may be better controlled now based on some sporadic home glucose readings.  Discussed importance of good sugar control in the setting of wound healing.  Patient voiced increased motivation to check her sugars more regularly and eat a healthier diet.  Goalsetting behavior around a better diabetes diet, patient stated that she would like to eat vegetables 3 times a week.  She will continue Levemir 50 units in the morning and 40 units at night.  She will also check her sugars every day with the morning fasting and 1 afternoon postprandial readings daily.  She would like to follow-up via telephone telemedicine visit in 3 weeks.  Offered ATC appointment versus patient calling back to follow-up with me.  Patient will call back to make an appointment    Time spent during visit with patient: 10 minutes  Bufford Lope, DO PGY-3, [facility] 05/16/2018 10:28 AM

## 2018-05-16 NOTE — Telephone Encounter (Signed)
Rachel Chaney from [facility] called and wanted to give Dr. Sharol Given an Juluis Rainier. Patient has been putting off care-- s/p right transmetatarsal amp.  Per Rachel Chaney, they will try to see patient on Tuesday, 05/21/2018.  CB# is [phone].  Thank You.

## 2018-05-16 NOTE — Telephone Encounter (Signed)
Noted  

## 2018-05-16 NOTE — Assessment & Plan Note (Addendum)
Uncontrolled based on last A1c.  However may be better controlled now based on some sporadic home glucose readings.  Discussed importance of good sugar control in the setting of wound healing.  Patient voiced increased motivation to check her sugars more regularly and eat a healthier diet.  Goalsetting behavior around a better diabetes diet, patient stated that she would like to eat vegetables 3 times a week.  She will continue Levemir 50 units in the morning and 40 units at night.  She will also check her sugars every day with the morning fasting and 1 afternoon postprandial readings daily.  She would like to follow-up via telephone telemedicine visit in 3 weeks.  Offered ATC appointment versus patient calling back to follow-up with me.  Patient will call back to make an appointment

## 2018-05-17 ENCOUNTER — Other Ambulatory Visit: Payer: Self-pay | Admitting: Internal Medicine

## 2018-05-23 ENCOUNTER — Other Ambulatory Visit: Payer: Self-pay | Admitting: Physician Assistant

## 2018-05-23 ENCOUNTER — Encounter (HOSPITAL_COMMUNITY): Payer: Self-pay | Admitting: *Deleted

## 2018-05-23 ENCOUNTER — Encounter: Payer: Self-pay | Admitting: Orthopedic Surgery

## 2018-05-23 ENCOUNTER — Telehealth: Payer: Self-pay

## 2018-05-23 ENCOUNTER — Other Ambulatory Visit: Payer: Self-pay

## 2018-05-23 ENCOUNTER — Ambulatory Visit (INDEPENDENT_AMBULATORY_CARE_PROVIDER_SITE_OTHER): Payer: Medicare Other | Admitting: Orthopedic Surgery

## 2018-05-23 VITALS — Ht 64.0 in | Wt 185.0 lb

## 2018-05-23 DIAGNOSIS — Z89431 Acquired absence of right foot: Secondary | ICD-10-CM

## 2018-05-23 DIAGNOSIS — Z9981 Dependence on supplemental oxygen: Secondary | ICD-10-CM

## 2018-05-23 DIAGNOSIS — T8781 Dehiscence of amputation stump: Secondary | ICD-10-CM

## 2018-05-23 DIAGNOSIS — Z794 Long term (current) use of insulin: Secondary | ICD-10-CM

## 2018-05-23 DIAGNOSIS — E1140 Type 2 diabetes mellitus with diabetic neuropathy, unspecified: Secondary | ICD-10-CM

## 2018-05-23 DIAGNOSIS — N186 End stage renal disease: Secondary | ICD-10-CM

## 2018-05-23 DIAGNOSIS — Z992 Dependence on renal dialysis: Secondary | ICD-10-CM

## 2018-05-23 MED ORDER — PENTOXIFYLLINE ER 400 MG PO TBCR: 400.0000 mg | EXTENDED_RELEASE_TABLET | Freq: Three times a day (TID) | ORAL | 1 refills | Status: DC

## 2018-05-23 MED ORDER — NITROGLYCERIN 0.2 MG/HR TD PT24: 0.2000 mg | MEDICATED_PATCH | Freq: Every day | TRANSDERMAL | 12 refills | Status: DC

## 2018-05-23 NOTE — Progress Notes (Signed)
Mrs Rachel Chaney denies chest pain or shortness of breath.Mrs. Rachel Chaney said she can not get a ride to be tested for COVID. I instructed patient to arrive at 0830, so that COVID test can be performed and resulted. Patient denies that she or her family has experienced any of the following: Cough Fever >100.4 Runny Nose Sore Throat Difficulty breathing/ shortness of breath Travel in past 14 days- none  Mrs. Rachel Chaney has dialysis MWF at 7:30, I will check with Malachy Mood to see what plans have been made for this.  Patient is scheduled as an ambulatory patient.  Mrs Rachel Chaney reports that she does not take Trental- "I didn't know I wa supposed to."  Medication record has not been updated by pharmacy, I instructed patient to call pharmacy or to answer the call when se is called by pharmacy.  Mrs Rachel Chaney has Type II diabetes, she reports that fasting CBGs run in the 140's.  I instructed patient to take 20 units Levemir today and if CBG is > 70 in am to take 25 units. I instructed patient to check CBG after awaking and every 2 hours until arrival  to the hospital.  I Instructed patient if CBG is less than 70 to drink 1/2 cup of a clear juice. Recheck CBG in 15 minutes then call pre- op desk at [phone] for further instructions. If scheduled to receive Insulin, do not take Insulin.

## 2018-05-23 NOTE — Telephone Encounter (Signed)
Rachel Chaney was called and advise patient is having surgery.

## 2018-05-23 NOTE — Telephone Encounter (Signed)
Voicemail on triage phone from [place] [place] with [facility] called asking for call back needing clarification on orders for patient.  Contact [phone]

## 2018-05-23 NOTE — Progress Notes (Signed)
Office Visit Note   Patient: Rachel Chaney           Date of Birth: 06/15/1960           MRN: [id number] Visit Date: 05/23/2018              Requested by: Bufford Lope, DO [address] [place], [place] [address] PCP: Bufford Lope, DO  Chief Complaint  Patient presents with  . Right Foot - Routine Post Op    04/24/18 right transmet      HPI: The patient is a 58 yo woman who is here for post operative follow up following right foot transmetatarsal amputation on 04/24/2018. She is about 1 month post op. She is using a dry dressing to the site and notes tan drainage on the dressings at times.   Assessment & Plan: Visit Diagnoses:  1. History of transmetatarsal amputation of right foot ([place])   2. Type 2 diabetes mellitus with diabetic neuropathy, with long-term current use of insulin ([place])   3. ESRD on dialysis ([place])   4. Oxygen dependent   5. Dehiscence of amputation stump (HCC)     Plan: Plan revision right TMA in the OR this week. Follow up in the office following revision surgery  Follow-Up Instructions: Return in about 1 week (around 05/30/2018).   Ortho Exam  Patient is alert, oriented, no adenopathy, well-dressed, normal affect, normal respiratory effort. Sutures were removed this visit. There is dehiscence over the lateral incision line with exposed bone. Minimal odor and serous to slightly bloody drainage. Weakly palpable pedal pulses and biphasic by Doppler. Edema distally.       Imaging: No results found. No images are attached to the encounter.  Labs: Lab Results  Component Value Date   HGBA1C 11.4 (H) 02/26/2018   HGBA1C 9.1 (A) 08/23/2017   HGBA1C 8.6 04/12/2017   ESRSEDRATE 58 (H) 04/12/2015   ESRSEDRATE 85 (H) 09/11/2013   ESRSEDRATE 77 (H) 10/16/2011   CRP 0.6 04/12/2015   REPTSTATUS 03/15/2018 FINAL 03/10/2018   GRAMSTAIN  02/27/2018    ABUNDANT WBC PRESENT, PREDOMINANTLY PMN ABUNDANT GRAM POSITIVE COCCI    CULT  03/10/2018    NO GROWTH 5  DAYS Performed at [facility], [place] [address] [place], [place] [address]    LABORGA STAPHYLOCOCCUS AUREUS 02/27/2018     Lab Results  Component Value Date   ALBUMIN 2.5 (L) 04/25/2018   ALBUMIN 2.3 (L) 03/13/2018   ALBUMIN 2.2 (L) 03/11/2018    Body mass index is 31.76 kg/m.  Orders:  No orders of the defined types were placed in this encounter.  Meds ordered this encounter  Medications  . nitroGLYCERIN (NITRODUR - DOSED IN MG/24 HR) 0.2 mg/hr patch    Sig: Place 1 patch (0.2 mg total) onto the skin daily.    Dispense:  30 patch    Refill:  12  . pentoxifylline (TRENTAL) 400 MG CR tablet    Sig: Take 1 tablet (400 mg total) by mouth 3 (three) times daily with meals.    Dispense:  90 tablet    Refill:  1     Procedures: No procedures performed  Clinical Data: No additional findings.  ROS:  All other systems negative, except as noted in the HPI. Review of Systems  Objective: Vital Signs: Ht _0  (1.626 m)   Wt 185 lb (83.9 kg)   LMP 09/18/2011   BMI 31.76 kg/m   Specialty Comments:  No  specialty comments available.  PMFS History: Patient Active Problem List   Diagnosis Date Noted  . S/P transmetatarsal amputation of foot, right ([place]) 04/24/2018  . Wound dehiscence, surgical   . Cardiac arrest ([place]) 03/19/2018  . PAF (paroxysmal atrial fibrillation) ([place])   . Respiratory distress   . Pulmonary infiltrates   . Ventilator associated pneumonia ([place])   . Oxygen dependent   . Hypotension   . Respiratory failure ([place])   . Type 2 diabetes mellitus with diabetic neuropathy ([place])   . Abscess of toe of right foot 02/26/2018  . Diabetes mellitus type 2, uncontrolled ([place]) 02/26/2018  . Toe infection   . Diabetic polyneuropathy associated with type 2 diabetes mellitus ([place])   . Osteomyelitis of second toe of right foot ([place])   . Severe protein-calorie malnutrition ([place])   . Open toe fracture 02/25/2018  . Seasonal allergies 04/12/2017  .  Post-menopausal bleeding 12/07/2015  . ESRD on dialysis ([place]) 05/25/2015  . Hypoalbuminemia   . Abnormal appearance of cervix 08/08/2011  . Back pain 08/07/2011  . GERD (gastroesophageal reflux disease) 04/27/2010  . Hyperlipidemia 03/08/2006  . Morbid obesity ([place]) 03/08/2006  . Essential hypertension with morbid obesity 03/08/2006   Past Medical History:  Diagnosis Date  . Cardiac arrest ([place]) 02/28/2018   while on hemodialysis in hospital  . CHF (congestive heart failure) ([place])   . Chronic kidney disease due to diabetes mellitus ([place]) 10/13/2013  . Diabetes mellitus    Type II  . ESRD (end stage renal disease) ([place])    MWF - [facility]  . Hyperlipidemia   . Hypertension   . Neuropathy     Family History  Problem Relation Age of Onset  . Heart disease Mother   . Heart disease Father     Past Surgical History:  Procedure Laterality Date  . AMPUTATION Right 02/27/2018   Procedure: RIGHT FOOT 2ND AND 3RD RAY AMPUTATION;  Surgeon: [name], MD;  Location: [facility];  Service: Orthopedics;  Laterality: Right;  . AMPUTATION Right 04/24/2018   Procedure: RIGHT TRANSMETATARSAL AMPUTATION, PLACE VAC;  Surgeon: [name], MD;  Location: [facility];  Service: Orthopedics;  Laterality: Right;  . AV FISTULA PLACEMENT Left 04/15/2015   Procedure: ARTERIOVENOUS (AV) FISTULA CREATION;  Surgeon: Mal Misty, MD;  Location: [facility];  Service: Vascular;  Laterality: Left;  . EYE SURGERY Right    retinal detachment  . FISTULA SUPERFICIALIZATION Left 06/03/2015   Procedure: LEFT UPPER ARM BRACHIOCEPHALIC FISTULA SUPERFICIALIZATION;  Surgeon: Mal Misty, MD;  Location: [facility];  Service: Vascular;  Laterality: Left;  from MAC to General   Social History   Occupational History  . Not on file  Tobacco Use  . Smoking status: Never Smoker  . Smokeless tobacco: Never Used  Substance and Sexual Activity  . Alcohol use: No    Alcohol/week: 0.0 standard drinks  . Drug use: No  .  Sexual activity: Never

## 2018-05-24 ENCOUNTER — Ambulatory Visit (HOSPITAL_COMMUNITY): Payer: Medicare Other | Admitting: Certified Registered Nurse Anesthetist

## 2018-05-24 ENCOUNTER — Ambulatory Visit (HOSPITAL_COMMUNITY)
Admission: RE | Admit: 2018-05-24 | Discharge: 2018-05-24 | Disposition: A | Discharge status: Home / Self Care | Payer: Medicare Other | Attending: Orthopedic Surgery | Admitting: Orthopedic Surgery

## 2018-05-24 ENCOUNTER — Encounter (HOSPITAL_COMMUNITY)
Admission: RE | Disposition: A | Discharge status: Home / Self Care | Payer: Self-pay | Source: Home / Self Care | Attending: Orthopedic Surgery

## 2018-05-24 ENCOUNTER — Encounter (HOSPITAL_COMMUNITY): Payer: Self-pay

## 2018-05-24 ENCOUNTER — Other Ambulatory Visit: Payer: Self-pay

## 2018-05-24 DIAGNOSIS — N186 End stage renal disease: Secondary | ICD-10-CM | POA: Diagnosis not present

## 2018-05-24 DIAGNOSIS — I509 Heart failure, unspecified: Secondary | ICD-10-CM | POA: Diagnosis not present

## 2018-05-24 DIAGNOSIS — D631 Anemia in chronic kidney disease: Secondary | ICD-10-CM | POA: Diagnosis not present

## 2018-05-24 DIAGNOSIS — Z8674 Personal history of sudden cardiac arrest: Secondary | ICD-10-CM | POA: Diagnosis not present

## 2018-05-24 DIAGNOSIS — Z992 Dependence on renal dialysis: Secondary | ICD-10-CM | POA: Diagnosis not present

## 2018-05-24 DIAGNOSIS — Z79899 Other long term (current) drug therapy: Secondary | ICD-10-CM | POA: Insufficient documentation

## 2018-05-24 DIAGNOSIS — K219 Gastro-esophageal reflux disease without esophagitis: Secondary | ICD-10-CM | POA: Insufficient documentation

## 2018-05-24 DIAGNOSIS — Z794 Long term (current) use of insulin: Secondary | ICD-10-CM | POA: Insufficient documentation

## 2018-05-24 DIAGNOSIS — Z89431 Acquired absence of right foot: Secondary | ICD-10-CM | POA: Diagnosis not present

## 2018-05-24 DIAGNOSIS — E785 Hyperlipidemia, unspecified: Secondary | ICD-10-CM | POA: Insufficient documentation

## 2018-05-24 DIAGNOSIS — E1140 Type 2 diabetes mellitus with diabetic neuropathy, unspecified: Secondary | ICD-10-CM | POA: Insufficient documentation

## 2018-05-24 DIAGNOSIS — E1122 Type 2 diabetes mellitus with diabetic chronic kidney disease: Secondary | ICD-10-CM | POA: Diagnosis not present

## 2018-05-24 DIAGNOSIS — T8781 Dehiscence of amputation stump: Secondary | ICD-10-CM | POA: Insufficient documentation

## 2018-05-24 DIAGNOSIS — Y835 Amputation of limb(s) as the cause of abnormal reaction of the patient, or of later complication, without mention of misadventure at the time of the procedure: Secondary | ICD-10-CM | POA: Diagnosis not present

## 2018-05-24 DIAGNOSIS — I110 Hypertensive heart disease with heart failure: Secondary | ICD-10-CM | POA: Insufficient documentation

## 2018-05-24 DIAGNOSIS — Z1159 Encounter for screening for other viral diseases: Secondary | ICD-10-CM | POA: Insufficient documentation

## 2018-05-24 HISTORY — DX: Polyneuropathy, unspecified: G62.9

## 2018-05-24 HISTORY — PX: STUMP REVISION: SHX6102

## 2018-05-24 LAB — POCT I-STAT 4, (NA,K, GLUC, HGB,HCT)
Glucose, Bld: 84 mg/dL (ref 70–99)
HCT: 43 % (ref 36.0–46.0)
Hemoglobin: 14.6 g/dL (ref 12.0–15.0)
Potassium: 4.6 mmol/L (ref 3.5–5.1)
Sodium: 137 mmol/L (ref 135–145)

## 2018-05-24 LAB — GLUCOSE, CAPILLARY
Glucose-Capillary: 95 mg/dL (ref 70–99)
Glucose-Capillary: 55 mg/dL — ABNORMAL LOW (ref 70–99)
Glucose-Capillary: 71 mg/dL (ref 70–99)
Glucose-Capillary: 145 mg/dL — ABNORMAL HIGH (ref 70–99)

## 2018-05-24 LAB — SURGICAL PCR SCREEN
MRSA, PCR: NEGATIVE
Staphylococcus aureus: POSITIVE — AB

## 2018-05-24 LAB — SARS CORONAVIRUS 2 BY RT PCR (HOSPITAL ORDER, PERFORMED IN ~~LOC~~ HOSPITAL LAB): SARS Coronavirus 2: NEGATIVE

## 2018-05-24 SURGERY — REVISION, AMPUTATION SITE
Anesthesia: Monitor Anesthesia Care | Site: Leg Lower | Laterality: Right

## 2018-05-24 MED ORDER — PROPOFOL 10 MG/ML IV BOLUS
INTRAVENOUS | Status: DC | PRN
  Administered 2018-05-24: 20 mg via INTRAVENOUS

## 2018-05-24 MED ORDER — FENTANYL CITRATE (PF) 100 MCG/2ML IJ SOLN
INTRAMUSCULAR | Status: AC
  Administered 2018-05-24: 50 ug via INTRAVENOUS
  Filled 2018-05-24: qty 2

## 2018-05-24 MED ORDER — POVIDONE-IODINE 10 % EX SWAB: 2.0000 "application " | Freq: Once | CUTANEOUS | Status: DC

## 2018-05-24 MED ORDER — PROPOFOL 500 MG/50ML IV EMUL
INTRAVENOUS | Status: DC | PRN
  Administered 2018-05-24: 75 ug/kg/min via INTRAVENOUS

## 2018-05-24 MED ORDER — FENTANYL CITRATE (PF) 100 MCG/2ML IJ SOLN
50.0000 ug | Freq: Once | INTRAMUSCULAR | Status: AC
  Administered 2018-05-24: 50 ug via INTRAVENOUS

## 2018-05-24 MED ORDER — BUPIVACAINE-EPINEPHRINE (PF) 0.5% -1:200000 IJ SOLN
INTRAMUSCULAR | Status: DC | PRN
  Administered 2018-05-24: 30 mL via PERINEURAL

## 2018-05-24 MED ORDER — LIDOCAINE 2% (20 MG/ML) 5 ML SYRINGE
INTRAMUSCULAR | Status: AC
  Filled 2018-05-24: qty 20

## 2018-05-24 MED ORDER — CHLORHEXIDINE GLUCONATE 4 % EX LIQD: 60.0000 mL | Freq: Once | CUTANEOUS | Status: DC

## 2018-05-24 MED ORDER — 0.9 % SODIUM CHLORIDE (POUR BTL) OPTIME
TOPICAL | Status: DC | PRN
  Administered 2018-05-24: 1000 mL

## 2018-05-24 MED ORDER — PHENYLEPHRINE 40 MCG/ML (10ML) SYRINGE FOR IV PUSH (FOR BLOOD PRESSURE SUPPORT)
PREFILLED_SYRINGE | INTRAVENOUS | Status: AC
  Filled 2018-05-24: qty 10

## 2018-05-24 MED ORDER — HYDROCODONE-ACETAMINOPHEN 7.5-325 MG PO TABS: 1.0000 | ORAL_TABLET | Freq: Once | ORAL | Status: DC | PRN

## 2018-05-24 MED ORDER — CEFAZOLIN SODIUM-DEXTROSE 2-4 GM/100ML-% IV SOLN
2.0000 g | INTRAVENOUS | Status: AC
  Administered 2018-05-24: 2 g via INTRAVENOUS

## 2018-05-24 MED ORDER — MIDAZOLAM HCL 2 MG/2ML IJ SOLN
INTRAMUSCULAR | Status: AC
  Filled 2018-05-24: qty 2

## 2018-05-24 MED ORDER — SODIUM CHLORIDE 0.9 % IV SOLN
INTRAVENOUS | Status: DC
  Administered 2018-05-24: 09:00:00 via INTRAVENOUS

## 2018-05-24 MED ORDER — PHENYLEPHRINE 40 MCG/ML (10ML) SYRINGE FOR IV PUSH (FOR BLOOD PRESSURE SUPPORT)
PREFILLED_SYRINGE | INTRAVENOUS | Status: DC | PRN
  Administered 2018-05-24: 240 ug via INTRAVENOUS
  Administered 2018-05-24: 160 ug via INTRAVENOUS

## 2018-05-24 MED ORDER — HYDROCODONE-ACETAMINOPHEN 5-325 MG PO TABS: 1.0000 | ORAL_TABLET | Freq: Four times a day (QID) | ORAL | 0 refills | Status: DC | PRN

## 2018-05-24 MED ORDER — ONDANSETRON HCL 4 MG/2ML IJ SOLN
INTRAMUSCULAR | Status: DC | PRN
  Administered 2018-05-24: 4 mg via INTRAVENOUS

## 2018-05-24 MED ORDER — MIDAZOLAM HCL 2 MG/2ML IJ SOLN
2.0000 mg | Freq: Once | INTRAMUSCULAR | Status: AC
  Administered 2018-05-24: 2 mg via INTRAVENOUS

## 2018-05-24 MED ORDER — FENTANYL CITRATE (PF) 100 MCG/2ML IJ SOLN: 25.0000 ug | INTRAMUSCULAR | Status: DC | PRN

## 2018-05-24 MED ORDER — MIDAZOLAM HCL 2 MG/2ML IJ SOLN
INTRAMUSCULAR | Status: AC
  Administered 2018-05-24: 2 mg via INTRAVENOUS
  Filled 2018-05-24: qty 2

## 2018-05-24 MED ORDER — ONDANSETRON HCL 4 MG/2ML IJ SOLN
INTRAMUSCULAR | Status: AC
  Filled 2018-05-24: qty 8

## 2018-05-24 MED ORDER — DEXAMETHASONE SODIUM PHOSPHATE 10 MG/ML IJ SOLN
INTRAMUSCULAR | Status: AC
  Filled 2018-05-24: qty 2

## 2018-05-24 MED ORDER — FENTANYL CITRATE (PF) 100 MCG/2ML IJ SOLN
INTRAMUSCULAR | Status: DC | PRN
  Administered 2018-05-24: 50 ug via INTRAVENOUS

## 2018-05-24 MED ORDER — ONDANSETRON HCL 4 MG/2ML IJ SOLN: 4.0000 mg | Freq: Once | INTRAMUSCULAR | Status: DC | PRN

## 2018-05-24 MED ORDER — CEFAZOLIN SODIUM-DEXTROSE 2-4 GM/100ML-% IV SOLN
INTRAVENOUS | Status: AC
  Filled 2018-05-24: qty 100

## 2018-05-24 MED ORDER — FENTANYL CITRATE (PF) 250 MCG/5ML IJ SOLN
INTRAMUSCULAR | Status: AC
  Filled 2018-05-24: qty 5

## 2018-05-24 MED ORDER — MIDAZOLAM HCL 5 MG/5ML IJ SOLN
INTRAMUSCULAR | Status: DC | PRN
  Administered 2018-05-24: 1 mg via INTRAVENOUS

## 2018-05-24 MED ORDER — PROPOFOL 10 MG/ML IV BOLUS
INTRAVENOUS | Status: AC
  Filled 2018-05-24: qty 20

## 2018-05-24 SURGICAL SUPPLY — 34 items
BLADE SAW RECIP 87.9 MT (BLADE) IMPLANT
BLADE SURG 21 STRL SS (BLADE) ×3 IMPLANT
BNDG COHESIVE 6X5 TAN STRL LF (GAUZE/BANDAGES/DRESSINGS) ×2 IMPLANT
CANISTER WOUND CARE 500ML ATS (WOUND CARE) ×3 IMPLANT
COVER SURGICAL LIGHT HANDLE (MISCELLANEOUS) ×3 IMPLANT
COVER WAND RF STERILE (DRAPES) ×3 IMPLANT
DRAPE EXTREMITY T 121X128X90 (DISPOSABLE) ×3 IMPLANT
DRAPE HALF SHEET 40X57 (DRAPES) ×3 IMPLANT
DRAPE INCISE IOBAN 66X45 STRL (DRAPES) ×3 IMPLANT
DRAPE U-SHAPE 47X51 STRL (DRAPES) ×6 IMPLANT
DRESSING PREVENA PLUS CUSTOM (GAUZE/BANDAGES/DRESSINGS) ×1 IMPLANT
DRSG PREVENA PLUS CUSTOM (GAUZE/BANDAGES/DRESSINGS) ×3
DURAPREP 26ML APPLICATOR (WOUND CARE) ×3 IMPLANT
ELECT REM PT RETURN 9FT ADLT (ELECTROSURGICAL) ×3
ELECTRODE REM PT RTRN 9FT ADLT (ELECTROSURGICAL) ×1 IMPLANT
GLOVE BIOGEL PI IND STRL 9 (GLOVE) ×1 IMPLANT
GLOVE BIOGEL PI INDICATOR 9 (GLOVE) ×2
GLOVE SURG ORTHO 9.0 STRL STRW (GLOVE) ×3 IMPLANT
GOWN STRL REUS W/ TWL XL LVL3 (GOWN DISPOSABLE) ×2 IMPLANT
GOWN STRL REUS W/TWL XL LVL3 (GOWN DISPOSABLE) ×4
KIT BASIN OR (CUSTOM PROCEDURE TRAY) ×3 IMPLANT
KIT DRSG PREVENA PLUS 7DAY 125 (MISCELLANEOUS) ×2 IMPLANT
KIT PREVENA INCISION MGT 13 (CANNISTER) ×2 IMPLANT
KIT TURNOVER KIT B (KITS) ×3 IMPLANT
MANIFOLD NEPTUNE II (INSTRUMENTS) ×3 IMPLANT
NS IRRIG 1000ML POUR BTL (IV SOLUTION) ×3 IMPLANT
PACK GENERAL/GYN (CUSTOM PROCEDURE TRAY) ×3 IMPLANT
PAD ARMBOARD 7.5X6 YLW CONV (MISCELLANEOUS) ×3 IMPLANT
PREVENA RESTOR ARTHOFORM 46X30 (CANNISTER) ×3 IMPLANT
STAPLER VISISTAT 35W (STAPLE) IMPLANT
SUT ETHILON 2 0 PSLX (SUTURE) ×6 IMPLANT
SUT SILK 2 0 (SUTURE)
SUT SILK 2-0 18XBRD TIE 12 (SUTURE) IMPLANT
TOWEL OR 17X26 10 PK STRL BLUE (TOWEL DISPOSABLE) ×3 IMPLANT

## 2018-05-24 NOTE — Op Note (Signed)
05/24/2018  2:10 PM  PATIENT:  Rachel Chaney    PRE-OPERATIVE DIAGNOSIS:  Dehiscence Right Transmetatarsal Amputation  POST-OPERATIVE DIAGNOSIS:  Same  PROCEDURE:  REVISION RIGHT TRANSMETATARSAL AMPUTATION WITH EXCISION BONE/SOFT TISSUE, LOCAL TISSUE  RE-ARRANGEMENT  AND VAC PLACEMENT  SURGEON:  [name], MD  PHYSICIAN ASSISTANT:None ANESTHESIA:   General  PREOPERATIVE INDICATIONS:  [name] is a  58 y.o. female with a diagnosis of Dehiscence Right Transmetatarsal Amputation who failed conservative measures and elected for surgical management.    The risks benefits and alternatives were discussed with the patient preoperatively including but not limited to the risks of infection, bleeding, nerve injury, cardiopulmonary complications, the need for revision surgery, among others, and the patient was willing to proceed.  OPERATIVE IMPLANTS: Praveena incisional wound VAC  @ENCIMAGES @  OPERATIVE FINDINGS: Osteomyelitis involving the fifth metatarsal.  OPERATIVE PROCEDURE: Patient was brought the operating room and underwent a regional anesthetic.  After adequate levels anesthesia were obtained patient's right lower extremity was prepped using DuraPrep draped into a sterile field a timeout was called.  A fishmouth incision was made around the wound dehiscence.  There was nonviable necrotic bone involving the fifth metatarsal.  A revision transmetatarsal amputation was performed using a reciprocating saw and this included all metatarsals approximately a centimeter resected.  Electrocautery was used for hemostasis the wound was irrigated with normal saline.  The remaining soft tissue and wound edges were healthy and viable.  The incision was closed using 2-0 nylon.  A Praveena wound VAC was applied this had a good suction fit patient was taken the PACU in stable condition.   DISCHARGE PLANNING:  Antibiotic duration: Preoperative antibiotics  Weightbearing: Ideally  nonweightbearing on the right  Pain medication: Prescription called in for Vicodin  Dressing care/ Wound VAC: Continue for 1 week until follow-up  Ambulatory devices: Walker or crutches  Discharge to: Home.  Follow-up: In the office 1 week post operative.

## 2018-05-24 NOTE — Transfer of Care (Signed)
Immediate Anesthesia Transfer of Care Note  Patient: Rachel Chaney  Procedure(s) Performed: REVISION RIGHT TRANSMETATARSAL AMPUTATION WITH EXCISION BONE/SOFT TISSUE, LOCAL TISSUE  RE-ARRANGEMENT  AND VAC PLACEMENT (Right Leg Lower)  Patient Location: PACU  Anesthesia Type:MAC combined with regional for post-op pain  Level of Consciousness: awake, alert  and oriented  Airway & Oxygen Therapy: Patient Spontanous Breathing and Patient connected to nasal cannula oxygen  Post-op Assessment: Report given to RN and Post -op Vital signs reviewed and stable  Post vital signs: Reviewed and stable  Last Vitals:  Vitals Value Taken Time  BP    Temp    Pulse    Resp    SpO2      Last Pain:  Vitals:   05/24/18 1030  TempSrc:   PainSc: 0-No pain      Patients Stated Pain Goal: 0 (95/63/87 5643)  Complications: No apparent anesthesia complications

## 2018-05-24 NOTE — Progress Notes (Signed)
Orthopedic Tech Progress Note Patient Details:  Rachel Chaney 1960/10/04 [id number] PACU RN called requesting post op shoe Ortho Devices Type of Ortho Device: Postop shoe/boot Ortho Device/Splint Location: LRE Ortho Device/Splint Interventions: Adjustment, Application, Ordered   Post Interventions Patient Tolerated: Well Instructions Provided: Care of device, Adjustment of device   [name] 05/24/2018, 12:23 PM

## 2018-05-24 NOTE — Progress Notes (Signed)
Hypoglycemic Event  CBG: 55  Treatment: 8 oz juice/soda  Symptoms: None  Follow-up CBG: Time:1205 CBG Result:71  Possible Reasons for Event: Unknown  Comments/MD notified:Dr. Army Melia

## 2018-05-24 NOTE — Anesthesia Postprocedure Evaluation (Signed)
Anesthesia Post Note  Patient: Early Chars  Procedure(s) Performed: REVISION RIGHT TRANSMETATARSAL AMPUTATION WITH EXCISION BONE/SOFT TISSUE, LOCAL TISSUE  RE-ARRANGEMENT  AND VAC PLACEMENT (Right Leg Lower)     Patient location during evaluation: PACU Anesthesia Type: Regional Level of consciousness: awake and alert and oriented Pain management: pain level controlled Vital Signs Assessment: post-procedure vital signs reviewed and stable Respiratory status: spontaneous breathing, nonlabored ventilation and respiratory function stable Cardiovascular status: blood pressure returned to baseline and stable Postop Assessment: no apparent nausea or vomiting Anesthetic complications: no    Last Vitals:  Vitals:   05/24/18 1215 05/24/18 1230  BP: (!) 112/51 110/60  Pulse: 77 77  Resp: 20 19  Temp:    SpO2: 100% 96%    Last Pain:  Vitals:   05/24/18 1230  TempSrc:   PainSc: 0-No pain        RLE Motor Response: Purposeful movement;Responds to commands (05/24/18 1230) RLE Sensation: Full sensation (05/24/18 1230)      Zoiee Wimmer A.

## 2018-05-24 NOTE — Anesthesia Preprocedure Evaluation (Addendum)
Anesthesia Evaluation  Patient identified by MRN, date of birth, ID band Patient awake    Reviewed: Allergy & Precautions, NPO status , Patient's Chart, lab work & pertinent test results, reviewed documented beta blocker date and time   Airway Mallampati: II  TM Distance: >3 FB Neck ROM: Full    Dental no notable dental hx. (+) Teeth Intact   Pulmonary pneumonia, resolved,    Pulmonary exam normal breath sounds clear to auscultation       Cardiovascular hypertension, Pt. on medications and Pt. on home beta blockers +CHF  Normal cardiovascular exam Rhythm:Regular Rate:Normal  Severe LVH   Neuro/Psych Diabetic neuropathy  Neuromuscular disease negative psych ROS   GI/Hepatic Neg liver ROS, GERD  Medicated and Controlled,  Endo/Other  diabetes  Renal/GU ESRF and DialysisRenal diseaseLast dialysis 05/22/2018  negative genitourinary   Musculoskeletal  (+) Arthritis , Dehiscence of Right Transmetatarsal Amputation   Abdominal (+) + obese,   Peds  Hematology  (+) anemia ,   Anesthesia Other Findings   Reproductive/Obstetrics                           Anesthesia Physical Anesthesia Plan  ASA: IV  Anesthesia Plan: MAC and Regional   Post-op Pain Management:    Induction: Intravenous  PONV Risk Score and Plan: 3 and Ondansetron, Propofol infusion and Treatment may vary due to age or medical condition  Airway Management Planned: Natural Airway, Nasal Cannula and Simple Face Mask  Additional Equipment:   Intra-op Plan:   Post-operative Plan:   Informed Consent: I have reviewed the patients History and Physical, chart, labs and discussed the procedure including the risks, benefits and alternatives for the proposed anesthesia with the patient or authorized representative who has indicated his/her understanding and acceptance.     Dental advisory given  Plan Discussed with: Surgeon and  CRNA  Anesthesia Plan Comments: (Labs OK for surgery)       Anesthesia Quick Evaluation

## 2018-05-24 NOTE — H&P (Signed)
Rachel Chaney is an 58 y.o. female.   Chief Complaint: Dehiscence right transmetatarsal amputation HPI: The patient is a 58 year old woman who is status post a right transmetatarsal amputation on 04/24/2018.  She has developed dehiscence over the lateral incision line with exposed bone.  She presents for revision of her right transmetatarsal amputation.  Past Medical History:  Diagnosis Date  . Cardiac arrest ([place]) 02/28/2018   while on hemodialysis in hospital  . CHF (congestive heart failure) ([place])   . Chronic kidney disease due to diabetes mellitus ([place]) 10/13/2013  . Diabetes mellitus    Type II  . ESRD (end stage renal disease) ([place])    MWF - [facility]  . Hyperlipidemia   . Hypertension   . Neuropathy     Past Surgical History:  Procedure Laterality Date  . AMPUTATION Right 02/27/2018   Procedure: RIGHT FOOT 2ND AND 3RD RAY AMPUTATION;  Surgeon: [name], MD;  Location: [facility];  Service: Orthopedics;  Laterality: Right;  . AMPUTATION Right 04/24/2018   Procedure: RIGHT TRANSMETATARSAL AMPUTATION, PLACE VAC;  Surgeon: [name], MD;  Location: [facility];  Service: Orthopedics;  Laterality: Right;  . AV FISTULA PLACEMENT Left 04/15/2015   Procedure: ARTERIOVENOUS (AV) FISTULA CREATION;  Surgeon: Mal Misty, MD;  Location: [facility];  Service: Vascular;  Laterality: Left;  . EYE SURGERY Right    retinal detachment  . FISTULA SUPERFICIALIZATION Left 06/03/2015   Procedure: LEFT UPPER ARM BRACHIOCEPHALIC FISTULA SUPERFICIALIZATION;  Surgeon: Mal Misty, MD;  Location: [facility];  Service: Vascular;  Laterality: Left;  from MAC to General    Family History  Problem Relation Age of Onset  . Heart disease Mother   . Heart disease Father    Social History:  reports that she has never smoked. She has never used smokeless tobacco. She reports that she does not drink alcohol or use drugs.  Allergies: No Known Allergies  No medications prior to admission.    No  results found for this or any previous visit (from the past 48 hour(s)). No results found.  Review of Systems  All other systems reviewed and are negative.   Last menstrual period 09/18/2011. Physical Exam  Constitutional: She is oriented to person, place, and time. She appears well-developed and well-nourished. No distress.  HENT:  Head: Normocephalic and atraumatic.  Neck: No tracheal deviation present. No thyromegaly present.  Cardiovascular: Normal rate and regular rhythm.  Respiratory: Effort normal. No stridor. No respiratory distress.  GI: Soft. She exhibits no distension.  Musculoskeletal:     Comments: Sutures were removed this visit. There is dehiscence over the lateral incision line with exposed bone. Minimal odor and serous to slightly bloody drainage. Weakly palpable pedal pulses and biphasic by Doppler. Edema distally.   Neurological: She is alert and oriented to person, place, and time. No cranial nerve deficit.  Skin: Skin is warm.  Psychiatric: She has a normal mood and affect. Her behavior is normal. Judgment and thought content normal.     Assessment/Plan Dehiscence right transmetatarsal amputation-plan revision right metatarsal amputation-the procedure and possible benefits and risks including the risk of bleeding, infection, neurovascular injury, possible need for further surgery were discussed with the patient and the patient's questions answered to her satisfaction.  The patient wishes to proceed with surgery at this time.  Erlinda Hong, PA-C 05/24/2018, 7:22 AM [facility] [phone]

## 2018-05-24 NOTE — Progress Notes (Deleted)
CRITICAL VALUE ALERT  Critical Value:  Glucose 55  Date & Time Notied:  0938 05/24/18  Provider Notified: Dr. Royce Macadamia  Orders Received/Actions taken: Give patient something to drink

## 2018-05-24 NOTE — Anesthesia Procedure Notes (Signed)
Anesthesia Regional Block: Popliteal block   Pre-Anesthetic Checklist: ,, timeout performed, Correct Patient, Correct Site, Correct Laterality, Correct Procedure, Correct Position, site marked, Risks and benefits discussed,  Surgical consent,  Pre-op evaluation,  At surgeon's request and post-op pain management  Laterality: Right  Prep: chloraprep       Needles:  Injection technique: Single-shot  Needle Type: Echogenic Stimulator Needle     Needle Length: 9cm  Needle Gauge: 21   Needle insertion depth: 7 cm   Additional Needles:   Procedures:,,,, ultrasound used (permanent image in chart),,,,  Narrative:  Start time: 05/24/2018 10:19 AM End time: 05/24/2018 10:24 AM Injection made incrementally with aspirations every 5 mL.  Performed by: Personally  Anesthesiologist: Josephine Igo, MD  Additional Notes: Timeout performed. Patient sedated. Relevant anatomy ID'd using [place]. Incremental 2-26ml injection of LA with frequent aspiration. Patient tolerated procedure well.        Right Popliteal Block

## 2018-05-25 ENCOUNTER — Encounter (HOSPITAL_COMMUNITY): Payer: Self-pay | Admitting: Orthopedic Surgery

## 2018-05-29 ENCOUNTER — Telehealth: Payer: Self-pay

## 2018-05-29 NOTE — Telephone Encounter (Signed)
Pt has appt tomorrow will hold message to discuss and see if [facility] visits are required. Have had multiple difficulties getting pt to allow for therapy and nursing staff to come

## 2018-05-29 NOTE — Telephone Encounter (Signed)
Shanon Brow PT with [facility] called stated they contacted patient to set up [facility] visits and patient has advised them that she did not want care until after next Tuesday.  He said he would need orders for this.  I advised that I could not give this order without speaking to provider first since patient is recent transmet amp Please call him to advise.    [phone]

## 2018-05-30 ENCOUNTER — Other Ambulatory Visit: Payer: Self-pay

## 2018-05-30 ENCOUNTER — Encounter: Payer: Self-pay | Admitting: Orthopedic Surgery

## 2018-05-30 ENCOUNTER — Ambulatory Visit (INDEPENDENT_AMBULATORY_CARE_PROVIDER_SITE_OTHER): Payer: Medicare Other | Admitting: Orthopedic Surgery

## 2018-05-30 VITALS — Ht 64.0 in | Wt 185.0 lb

## 2018-05-30 DIAGNOSIS — Z89431 Acquired absence of right foot: Secondary | ICD-10-CM

## 2018-05-30 NOTE — Progress Notes (Signed)
Office Visit Note   Patient: Rachel Chaney           Date of Birth: 1960/06/22           MRN: [id number] Visit Date: 05/30/2018              Requested by: Bufford Lope, DO [address] [place], [place] [address] PCP: Bufford Lope, DO  Chief Complaint  Patient presents with  . Right Foot - Routine Post Op    05/24/18 revision right transmet      HPI: Patient is a 58 year old woman who presents for evaluation status post transmetatarsal amputation 1 week ago.  Patient had approximately 100 cc in the wound VAC canister this is removed.  Patient declined home health nursing visits or therapy.  Assessment & Plan: Visit Diagnoses:  1. History of transmetatarsal amputation of right foot ([place])     Plan: Patient will start washing her foot with soap and water continue nonweightbearing dry dressing changes daily a nitroglycerin patch was applied and she will apply new patch daily.  We will need to set her up with [facility] for double upright braces extra-depth shoe orthotic and spacer once this has healed.  Follow-Up Instructions: Return in about 2 weeks (around 06/13/2018).   Ortho Exam  Patient is alert, oriented, no adenopathy, well-dressed, normal affect, normal respiratory effort. Examination patient has a well approximated wound there is some slight maceration around the wound edges.  There is no wound dehiscence no gaping of the wound no drainage.  There is no swelling.  Imaging: No results found. No images are attached to the encounter.  Labs: Lab Results  Component Value Date   HGBA1C 11.4 (H) 02/26/2018   HGBA1C 9.1 (A) 08/23/2017   HGBA1C 8.6 04/12/2017   ESRSEDRATE 58 (H) 04/12/2015   ESRSEDRATE 85 (H) 09/11/2013   ESRSEDRATE 77 (H) 10/16/2011   CRP 0.6 04/12/2015   REPTSTATUS 03/15/2018 FINAL 03/10/2018   GRAMSTAIN  02/27/2018    ABUNDANT WBC PRESENT, PREDOMINANTLY PMN ABUNDANT GRAM POSITIVE COCCI    CULT  03/10/2018    NO GROWTH 5 DAYS Performed at [facility], [place] [address], [place], [place] [address]    LABORGA STAPHYLOCOCCUS AUREUS 02/27/2018     Lab Results  Component Value Date   ALBUMIN 2.5 (L) 04/25/2018   ALBUMIN 2.3 (L) 03/13/2018   ALBUMIN 2.2 (L) 03/11/2018    Body mass index is 31.76 kg/m.  Orders:  No orders of the defined types were placed in this encounter.  No orders of the defined types were placed in this encounter.    Procedures: No procedures performed  Clinical Data: No additional findings.  ROS:  All other systems negative, except as noted in the HPI. Review of Systems  Objective: Vital Signs: Ht _0  (1.626 m)   Wt 185 lb (83.9 kg)   LMP 09/18/2011   BMI 31.76 kg/m   Specialty Comments:  No specialty comments available.  PMFS History: Patient Active Problem List   Diagnosis Date Noted  . Dehiscence of amputation stump ([place])   . S/P transmetatarsal amputation of foot, right ([place]) 04/24/2018  . Wound dehiscence, surgical   . Cardiac arrest ([place]) 03/19/2018  . PAF (paroxysmal atrial fibrillation) ([place])   . Respiratory distress   . Pulmonary infiltrates   . Ventilator associated pneumonia ([place])   . Oxygen dependent   . Hypotension   . Respiratory failure ([place])   . Type 2 diabetes  mellitus with diabetic neuropathy ([place])   . Abscess of toe of right foot 02/26/2018  . Diabetes mellitus type 2, uncontrolled ([place]) 02/26/2018  . Toe infection   . Diabetic polyneuropathy associated with type 2 diabetes mellitus ([place])   . Osteomyelitis of second toe of right foot ([place])   . Severe protein-calorie malnutrition ([place])   . Open toe fracture 02/25/2018  . Seasonal allergies 04/12/2017  . Post-menopausal bleeding 12/07/2015  . ESRD on dialysis ([place]) 05/25/2015  . Hypoalbuminemia   . Abnormal appearance of cervix 08/08/2011  . Back pain 08/07/2011  . GERD (gastroesophageal reflux disease) 04/27/2010  . Hyperlipidemia 03/08/2006  . Morbid obesity ([place]) 03/08/2006  . Essential  hypertension with morbid obesity 03/08/2006   Past Medical History:  Diagnosis Date  . Cardiac arrest ([place]) 02/28/2018   while on hemodialysis in hospital  . CHF (congestive heart failure) ([place])   . Chronic kidney disease due to diabetes mellitus ([place]) 10/13/2013  . Diabetes mellitus    Type II  . ESRD (end stage renal disease) ([place])    MWF - [facility]  . Hyperlipidemia   . Hypertension   . Neuropathy     Family History  Problem Relation Age of Onset  . Heart disease Mother   . Heart disease Father     Past Surgical History:  Procedure Laterality Date  . AMPUTATION Right 02/27/2018   Procedure: RIGHT FOOT 2ND AND 3RD RAY AMPUTATION;  Surgeon: [name], MD;  Location: [place] [facility];  Service: Orthopedics;  Laterality: Right;  . AMPUTATION Right 04/24/2018   Procedure: RIGHT TRANSMETATARSAL AMPUTATION, PLACE VAC;  Surgeon: [name], MD;  Location: [place];  Service: Orthopedics;  Laterality: Right;  . AV FISTULA PLACEMENT Left 04/15/2015   Procedure: ARTERIOVENOUS (AV) FISTULA CREATION;  Surgeon: Mal Misty, MD;  Location: [place];  Service: Vascular;  Laterality: Left;  . EYE SURGERY Right    retinal detachment  . FISTULA SUPERFICIALIZATION Left 06/03/2015   Procedure: LEFT UPPER ARM BRACHIOCEPHALIC FISTULA SUPERFICIALIZATION;  Surgeon: Mal Misty, MD;  Location: [facility];  Service: Vascular;  Laterality: Left;  from [place] to General  . STUMP REVISION Right 05/24/2018   Procedure: REVISION RIGHT TRANSMETATARSAL AMPUTATION WITH EXCISION BONE/SOFT TISSUE, LOCAL TISSUE  RE-ARRANGEMENT  AND VAC PLACEMENT;  Surgeon: [name], MD;  Location: [facility];  Service: Orthopedics;  Laterality: Right;   Social History   Occupational History  . Not on file  Tobacco Use  . Smoking status: Never Smoker  . Smokeless tobacco: Never Used  Substance and Sexual Activity  . Alcohol use: No    Alcohol/week: 0.0 standard drinks  . Drug use: No  . Sexual activity: Never

## 2018-05-30 NOTE — Telephone Encounter (Signed)
I called and lm on vm to advise that the pt has declined and rescheduled appts in the past so verbal ok for the PT to be moved to Tuesday next week but if the pt then declines they can d/c services until she would like to accept treatment. To call with any questions.

## 2018-06-08 ENCOUNTER — Other Ambulatory Visit: Payer: Self-pay | Admitting: Physician Assistant

## 2018-06-08 ENCOUNTER — Telehealth: Payer: Self-pay

## 2018-06-08 DIAGNOSIS — Z89431 Acquired absence of right foot: Secondary | ICD-10-CM

## 2018-06-08 NOTE — Telephone Encounter (Signed)
Called client to informed client of potential exposure to COVID-19. Client refuses testing. Informed patient that if she changes her mind, she may call back at [phone] and times. Client states she still has some pain, but it is improving.

## 2018-06-13 ENCOUNTER — Encounter: Payer: Self-pay | Admitting: Orthopedic Surgery

## 2018-06-13 ENCOUNTER — Other Ambulatory Visit: Payer: Self-pay

## 2018-06-13 ENCOUNTER — Ambulatory Visit (INDEPENDENT_AMBULATORY_CARE_PROVIDER_SITE_OTHER): Payer: Medicare Other | Admitting: Physician Assistant

## 2018-06-13 VITALS — Ht 64.0 in | Wt 185.0 lb

## 2018-06-13 DIAGNOSIS — Z89431 Acquired absence of right foot: Secondary | ICD-10-CM

## 2018-06-13 DIAGNOSIS — Z992 Dependence on renal dialysis: Secondary | ICD-10-CM

## 2018-06-13 DIAGNOSIS — T8781 Dehiscence of amputation stump: Secondary | ICD-10-CM

## 2018-06-13 DIAGNOSIS — Z9981 Dependence on supplemental oxygen: Secondary | ICD-10-CM

## 2018-06-13 DIAGNOSIS — N186 End stage renal disease: Secondary | ICD-10-CM

## 2018-06-13 DIAGNOSIS — Z794 Long term (current) use of insulin: Secondary | ICD-10-CM

## 2018-06-13 DIAGNOSIS — E1140 Type 2 diabetes mellitus with diabetic neuropathy, unspecified: Secondary | ICD-10-CM

## 2018-06-13 MED ORDER — DOXYCYCLINE HYCLATE 100 MG PO CAPS: 100.0000 mg | ORAL_CAPSULE | Freq: Two times a day (BID) | ORAL | 1 refills | Status: DC

## 2018-06-13 NOTE — Progress Notes (Signed)
Office Visit Note   Patient: Rachel Chaney           Date of Birth: 1960-04-21           MRN: [id number] Visit Date: 06/13/2018              Requested by: Bufford Lope, DO [address] [place], [place] [address] PCP: Bufford Lope, DO  Chief Complaint  Patient presents with  . Right Foot - Routine Post Op    05/24/18 right revision transmet  amputation       HPI: The patient is a 58 yo woman here for follow up of a right foot transmetatarsal amputation on 05/24/2018. She has been using a NTG patch and taking Trental to improve blood flow. The wound has opened up and there is yellow brown drainage. She is not currently on antibiotics.    Assessment & Plan: Visit Diagnoses:  1. Dehiscence of amputation stump ([place])   2. History of transmetatarsal amputation of right foot ([place])   3. Type 2 diabetes mellitus with diabetic neuropathy, with long-term current use of insulin ([place])   4. ESRD on dialysis ([place])   5. Oxygen dependent     Plan:Start Doxycycline 100 mg BID. Dr. Sharol Given discussed that she requires further surgery, revision to a right transtibial amputation at this time. The patient reports she is not ready to have surgery this week. She would like to start antibiotics and plan for further surgery late next week. Discussed with patient she should return or go to the ER for further evaluation for any worsening of her status.   Follow-Up Instructions: Return in about 5 days (around 06/18/2018).   Ortho Exam  Patient is alert, oriented, no adenopathy, well-dressed, normal affect, normal respiratory effort. Bone palpable over the medial dehiscence of incision.  There is moderate yellow-brown drainage.  The open area is approximately 4 x 1.5 cm and again the depth is to bone which is palpable at the base of the wound.  She has palpable pedal pulses.  Imaging: No results found. No images are attached to the encounter.  Labs: Lab Results  Component Value Date   HGBA1C 11.4 (H)  02/26/2018   HGBA1C 9.1 (A) 08/23/2017   HGBA1C 8.6 04/12/2017   ESRSEDRATE 58 (H) 04/12/2015   ESRSEDRATE 85 (H) 09/11/2013   ESRSEDRATE 77 (H) 10/16/2011   CRP 0.6 04/12/2015   REPTSTATUS 03/15/2018 FINAL 03/10/2018   GRAMSTAIN  02/27/2018    ABUNDANT WBC PRESENT, PREDOMINANTLY PMN ABUNDANT GRAM POSITIVE COCCI    CULT  03/10/2018    NO GROWTH 5 DAYS Performed at [facility], [place] [address] [place], [place] [address]    LABORGA STAPHYLOCOCCUS AUREUS 02/27/2018     Lab Results  Component Value Date   ALBUMIN 2.5 (L) 04/25/2018   ALBUMIN 2.3 (L) 03/13/2018   ALBUMIN 2.2 (L) 03/11/2018    Body mass index is 31.76 kg/m.  Orders:  No orders of the defined types were placed in this encounter.  Meds ordered this encounter  Medications  . doxycycline (VIBRAMYCIN) 100 MG capsule    Sig: Take 1 capsule (100 mg total) by mouth 2 (two) times daily.    Dispense:  28 capsule    Refill:  1     Procedures: No procedures performed  Clinical Data: No additional findings.  ROS:  All other systems negative, except as noted in the HPI. Review of Systems  Objective: Vital Signs: Ht _0  (  1.626 m)   Wt 185 lb (83.9 kg)   LMP 09/18/2011   BMI 31.76 kg/m   Specialty Comments:  No specialty comments available.  PMFS History: Patient Active Problem List   Diagnosis Date Noted  . Dehiscence of amputation stump ([place])   . S/P transmetatarsal amputation of foot, right ([place]) 04/24/2018  . Wound dehiscence, surgical   . Cardiac arrest ([place]) 03/19/2018  . PAF (paroxysmal atrial fibrillation) ([place])   . Respiratory distress   . Pulmonary infiltrates   . Ventilator associated pneumonia ([place])   . Oxygen dependent   . Hypotension   . Respiratory failure ([place])   . Type 2 diabetes mellitus with diabetic neuropathy ([place])   . Abscess of toe of right foot 02/26/2018  . Diabetes mellitus type 2, uncontrolled ([place]) 02/26/2018  . Toe infection   . Diabetic polyneuropathy  associated with type 2 diabetes mellitus ([place])   . Osteomyelitis of second toe of right foot ([place])   . Severe protein-calorie malnutrition ([place])   . Open toe fracture 02/25/2018  . Seasonal allergies 04/12/2017  . Post-menopausal bleeding 12/07/2015  . ESRD on dialysis ([place]) 05/25/2015  . Hypoalbuminemia   . Abnormal appearance of cervix 08/08/2011  . Back pain 08/07/2011  . GERD (gastroesophageal reflux disease) 04/27/2010  . Hyperlipidemia 03/08/2006  . Morbid obesity ([place]) 03/08/2006  . Essential hypertension with morbid obesity 03/08/2006   Past Medical History:  Diagnosis Date  . Cardiac arrest ([place]) 02/28/2018   while on hemodialysis in hospital  . CHF (congestive heart failure) ([place])   . Chronic kidney disease due to diabetes mellitus ([place]) 10/13/2013  . Diabetes mellitus    Type II  . ESRD (end stage renal disease) ([place])    MWF - [facility]  . Hyperlipidemia   . Hypertension   . Neuropathy     Family History  Problem Relation Age of Onset  . Heart disease Mother   . Heart disease Father     Past Surgical History:  Procedure Laterality Date  . AMPUTATION Right 02/27/2018   Procedure: RIGHT FOOT 2ND AND 3RD RAY AMPUTATION;  Surgeon: [name], MD;  Location: [facility];  Service: Orthopedics;  Laterality: Right;  . AMPUTATION Right 04/24/2018   Procedure: RIGHT TRANSMETATARSAL AMPUTATION, PLACE VAC;  Surgeon: [name], MD;  Location: [facility];  Service: Orthopedics;  Laterality: Right;  . AV FISTULA PLACEMENT Left 04/15/2015   Procedure: ARTERIOVENOUS (AV) FISTULA CREATION;  Surgeon: Mal Misty, MD;  Location: [facility];  Service: Vascular;  Laterality: Left;  . EYE SURGERY Right    retinal detachment  . FISTULA SUPERFICIALIZATION Left 06/03/2015   Procedure: LEFT UPPER ARM BRACHIOCEPHALIC FISTULA SUPERFICIALIZATION;  Surgeon: Mal Misty, MD;  Location: [facility];  Service: Vascular;  Laterality: Left;  from [place] to General  . STUMP REVISION Right 05/24/2018    Procedure: REVISION RIGHT TRANSMETATARSAL AMPUTATION WITH EXCISION BONE/SOFT TISSUE, LOCAL TISSUE  RE-ARRANGEMENT  AND VAC PLACEMENT;  Surgeon: [name], MD;  Location: [facility];  Service: Orthopedics;  Laterality: Right;   Social History   Occupational History  . Not on file  Tobacco Use  . Smoking status: Never Smoker  . Smokeless tobacco: Never Used  Substance and Sexual Activity  . Alcohol use: No    Alcohol/week: 0.0 standard drinks  . Drug use: No  . Sexual activity: Never

## 2018-06-18 ENCOUNTER — Other Ambulatory Visit: Payer: Self-pay

## 2018-06-18 ENCOUNTER — Ambulatory Visit (INDEPENDENT_AMBULATORY_CARE_PROVIDER_SITE_OTHER): Payer: Medicare Other | Admitting: Physician Assistant

## 2018-06-18 ENCOUNTER — Encounter: Payer: Self-pay | Admitting: Orthopedic Surgery

## 2018-06-18 VITALS — Ht 64.0 in | Wt 185.0 lb

## 2018-06-18 DIAGNOSIS — T8781 Dehiscence of amputation stump: Secondary | ICD-10-CM

## 2018-06-18 DIAGNOSIS — Z992 Dependence on renal dialysis: Secondary | ICD-10-CM

## 2018-06-18 DIAGNOSIS — Z794 Long term (current) use of insulin: Secondary | ICD-10-CM

## 2018-06-18 DIAGNOSIS — N186 End stage renal disease: Secondary | ICD-10-CM

## 2018-06-18 DIAGNOSIS — Z89431 Acquired absence of right foot: Secondary | ICD-10-CM

## 2018-06-18 DIAGNOSIS — E1140 Type 2 diabetes mellitus with diabetic neuropathy, unspecified: Secondary | ICD-10-CM

## 2018-06-18 NOTE — Progress Notes (Signed)
Office Visit Note   Patient: Rachel Chaney           Date of Birth: June 17, 1960           MRN: [id number] Visit Date: 06/18/2018              Requested by: Bufford Lope, DO [address] [place], [place] [address] PCP: Bufford Lope, DO  Chief Complaint  Patient presents with  . Right Foot - Routine Post Op    05/24/18 revision TMA right foot      HPI: The patient is a 58 yo woman here for follow up of a right foot transmetatarsal amputation on 05/24/2018. She has been on Doxycycline for the past week. She is not wanting to have any further surgery at this time. She is using a NTG patch to the foot and Trental 400 mg TID.   Assessment & Plan: Visit Diagnoses:  1. History of transmetatarsal amputation of right foot ([place])   2. Dehiscence of amputation stump ([place])   3. Type 2 diabetes mellitus with diabetic neuropathy, with long-term current use of insulin ([place])   4. ESRD on dialysis [facility])     Plan: Dr. Sharol Given discussed further surgery as the patient has exposed bone, but the patient declines further surgery at this time. She will continue Doxycycline until course is completed and daily dressing changes with dry gauze. Recommended patient go to the ED should she start to worsen or have signs of systemic infection such as fever or chills.  She will follow up in 2 weeks.   Follow-Up Instructions: No follow-ups on file.   Ortho Exam  Patient is alert, oriented, no adenopathy, well-dressed, normal affect, normal respiratory effort. The right transmetatarsal amputation site is dehisced over the medial incision line and has exposed bone. There is necrotic tissue in the peri incision area. There is thin tan drainage without odor today.  She has a NTG patch in place over the foot and palpable dorsalis pedis pulse.  Imaging: No results found. No images are attached to the encounter.  Labs: Lab Results  Component Value Date   HGBA1C 11.4 (H) 02/26/2018   HGBA1C 9.1 (A) 08/23/2017   HGBA1C 8.6 04/12/2017   ESRSEDRATE 58 (H) 04/12/2015   ESRSEDRATE 85 (H) 09/11/2013   ESRSEDRATE 77 (H) 10/16/2011   CRP 0.6 04/12/2015   REPTSTATUS 03/15/2018 FINAL 03/10/2018   GRAMSTAIN  02/27/2018    ABUNDANT WBC PRESENT, PREDOMINANTLY PMN ABUNDANT GRAM POSITIVE COCCI    CULT  03/10/2018    NO GROWTH 5 DAYS Performed at [facility], [place] [address], [place], [place] [address]    LABORGA STAPHYLOCOCCUS AUREUS 02/27/2018     Lab Results  Component Value Date   ALBUMIN 2.5 (L) 04/25/2018   ALBUMIN 2.3 (L) 03/13/2018   ALBUMIN 2.2 (L) 03/11/2018    Body mass index is 31.76 kg/m.  Orders:  No orders of the defined types were placed in this encounter.  No orders of the defined types were placed in this encounter.    Procedures: No procedures performed  Clinical Data: No additional findings.  ROS:  All other systems negative, except as noted in the HPI. Review of Systems  Objective: Vital Signs: Ht _0  (1.626 m)   Wt 185 lb (83.9 kg)   LMP 09/18/2011   BMI 31.76 kg/m   Specialty Comments:  No specialty comments available.  PMFS History: Patient Active Problem List   Diagnosis Date Noted  .  Dehiscence of amputation stump ([place])   . S/P transmetatarsal amputation of foot, right ([place]) 04/24/2018  . Wound dehiscence, surgical   . Cardiac arrest ([place]) 03/19/2018  . PAF (paroxysmal atrial fibrillation) ([place])   . Respiratory distress   . Pulmonary infiltrates   . Ventilator associated pneumonia ([place])   . Oxygen dependent   . Hypotension   . Respiratory failure ([place])   . Type 2 diabetes mellitus with diabetic neuropathy ([place])   . Abscess of toe of right foot 02/26/2018  . Diabetes mellitus type 2, uncontrolled ([place]) 02/26/2018  . Toe infection   . Diabetic polyneuropathy associated with type 2 diabetes mellitus ([place])   . Osteomyelitis of second toe of right foot ([place])   . Severe protein-calorie malnutrition ([place])   . Open toe fracture 02/25/2018   . Seasonal allergies 04/12/2017  . Post-menopausal bleeding 12/07/2015  . ESRD on dialysis ([place]) 05/25/2015  . Hypoalbuminemia   . Abnormal appearance of cervix 08/08/2011  . Back pain 08/07/2011  . GERD (gastroesophageal reflux disease) 04/27/2010  . Hyperlipidemia 03/08/2006  . Morbid obesity ([place]) 03/08/2006  . Essential hypertension with morbid obesity 03/08/2006   Past Medical History:  Diagnosis Date  . Cardiac arrest ([place]) 02/28/2018   while on hemodialysis in hospital  . CHF (congestive heart failure) ([place])   . Chronic kidney disease due to diabetes mellitus ([place]) 10/13/2013  . Diabetes mellitus    Type II  . ESRD (end stage renal disease) ([place])    MWF - [facility]  . Hyperlipidemia   . Hypertension   . Neuropathy     Family History  Problem Relation Age of Onset  . Heart disease Mother   . Heart disease Father     Past Surgical History:  Procedure Laterality Date  . AMPUTATION Right 02/27/2018   Procedure: RIGHT FOOT 2ND AND 3RD RAY AMPUTATION;  Surgeon: [name], MD;  Location: [facility];  Service: Orthopedics;  Laterality: Right;  . AMPUTATION Right 04/24/2018   Procedure: RIGHT TRANSMETATARSAL AMPUTATION, PLACE VAC;  Surgeon: [name], MD;  Location: [facility];  Service: Orthopedics;  Laterality: Right;  . AV FISTULA PLACEMENT Left 04/15/2015   Procedure: ARTERIOVENOUS (AV) FISTULA CREATION;  Surgeon: Mal Misty, MD;  Location: [facility];  Service: Vascular;  Laterality: Left;  . EYE SURGERY Right    retinal detachment  . FISTULA SUPERFICIALIZATION Left 06/03/2015   Procedure: LEFT UPPER ARM BRACHIOCEPHALIC FISTULA SUPERFICIALIZATION;  Surgeon: Mal Misty, MD;  Location: [facility];  Service: Vascular;  Laterality: Left;  from [place] to General  . STUMP REVISION Right 05/24/2018   Procedure: REVISION RIGHT TRANSMETATARSAL AMPUTATION WITH EXCISION BONE/SOFT TISSUE, LOCAL TISSUE  RE-ARRANGEMENT  AND VAC PLACEMENT;  Surgeon: [name], MD;  Location: [facility];   Service: Orthopedics;  Laterality: Right;   Social History   Occupational History  . Not on file  Tobacco Use  . Smoking status: Never Smoker  . Smokeless tobacco: Never Used  Substance and Sexual Activity  . Alcohol use: No    Alcohol/week: 0.0 standard drinks  . Drug use: No  . Sexual activity: Never

## 2018-06-21 ENCOUNTER — Telehealth: Payer: Self-pay

## 2018-06-21 NOTE — Telephone Encounter (Signed)
Shanon Brow with Rachel Chaney wanted to let Dr. Sharol Given know that patient has missed 3 visits and has not returned any phone calls.  Would like an extension on HHPT and add missed visits to the end of her treatment?  Cb# is [phone].  Please advise.  Thank you.

## 2018-06-21 NOTE — Telephone Encounter (Signed)
Patient HHPT was called and he advised me that patient has not been complaint and has also been up on her foot and wanted to make this known to Dr Sharol Given. Shanon Brow was also given a VO for orders.

## 2018-06-26 ENCOUNTER — Telehealth: Payer: Self-pay

## 2018-06-26 NOTE — Telephone Encounter (Signed)
Shanon Brow from [facility] called and states patient requested to be put on  hold until wound heals. Would like to be on hold for about 2 weeks. He states she has appt tomorrow.(I checked and looks like next OV appt is scheduled for 07/04/2018.)   CB [phone].

## 2018-06-26 NOTE — Telephone Encounter (Signed)
Rachel Chaney was called and was given VO for patient

## 2018-07-04 ENCOUNTER — Encounter: Payer: Self-pay | Admitting: Orthopedic Surgery

## 2018-07-04 ENCOUNTER — Other Ambulatory Visit: Payer: Self-pay

## 2018-07-04 ENCOUNTER — Ambulatory Visit (INDEPENDENT_AMBULATORY_CARE_PROVIDER_SITE_OTHER): Payer: Medicare Other | Admitting: Physician Assistant

## 2018-07-04 DIAGNOSIS — Z89431 Acquired absence of right foot: Secondary | ICD-10-CM

## 2018-07-04 MED ORDER — DOXYCYCLINE HYCLATE 100 MG PO CAPS: 100.0000 mg | ORAL_CAPSULE | Freq: Two times a day (BID) | ORAL | 1 refills | Status: DC

## 2018-07-04 NOTE — Progress Notes (Signed)
Office Visit Note   Patient: Rachel Chaney           Date of Birth: 1960-04-15           MRN: [id number] Visit Date: 07/04/2018              Requested by: Bufford Lope, DO [address] [place],  [place] [address] PCP: Bufford Lope, DO  Chief Complaint  Patient presents with  . Right Foot - Routine Post Op    05/24/18 right TMA revision      HPI: The patient is a 58 yo woman here for follow up of a right foot transmetatarsal amputation on 05/24/2018. She continues on Doxycycline. She reports minimal drainage from the area and no pain. She states she does not want further surgery at this time. She is using a NTG patch to the foot and taking Trental 400 mg TID and daily dry dressings to the foot.   Assessment & Plan: Visit Diagnoses:  1. History of transmetatarsal amputation of right foot ([place])     Plan:Dr. Sharol Given discussed further surgery to the right foot with the patient and  encouraged the patient to reconsider having surgery as continues to have draining wound with exposed bone. The patient remains resistant to surgery at this time and would like to continue antibiotics and local care at this time.  Discussed that if she develops and worsening of the wound or fever, chills or other systemic signs of infection, she should seek immediate medical care. She will follow up in 2 weeks.   Follow-Up Instructions: Return in about 2 weeks (around 07/18/2018).   Ortho Exam  Patient is alert, oriented, no adenopathy, well-dressed, normal affect, normal respiratory effort. The right transmetatarsal amputation site with medial dehiscence with exposed bone and drainage. + odor and necrotic soft tissue. She has a NTG patch on the dorsum of the foot.   Imaging: No results found. No images are attached to the encounter.  Labs: Lab Results  Component Value Date   HGBA1C 11.4 (H) 02/26/2018   HGBA1C 9.1 (A) 08/23/2017   HGBA1C 8.6 04/12/2017   ESRSEDRATE 58 (H) 04/12/2015   ESRSEDRATE  85 (H) 09/11/2013   ESRSEDRATE 77 (H) 10/16/2011   CRP 0.6 04/12/2015   REPTSTATUS 03/15/2018 FINAL 03/10/2018   GRAMSTAIN  02/27/2018    ABUNDANT WBC PRESENT, PREDOMINANTLY PMN ABUNDANT GRAM POSITIVE COCCI    CULT  03/10/2018    NO GROWTH 5 DAYS Performed at [facility], [place] [address] [place], [place] [address]    LABORGA STAPHYLOCOCCUS AUREUS 02/27/2018     Lab Results  Component Value Date   ALBUMIN 2.5 (L) 04/25/2018   ALBUMIN 2.3 (L) 03/13/2018   ALBUMIN 2.2 (L) 03/11/2018    Body mass index is 31.76 kg/m.  Orders:  No orders of the defined types were placed in this encounter.  Meds ordered this encounter  Medications  . doxycycline (VIBRAMYCIN) 100 MG capsule    Sig: Take 1 capsule (100 mg total) by mouth 2 (two) times daily.    Dispense:  28 capsule    Refill:  1     Procedures: No procedures performed  Clinical Data: No additional findings.  ROS:  All other systems negative, except as noted in the HPI. Review of Systems  Objective: Vital Signs: Ht _0  (1.626 m)   Wt 185 lb (83.9 kg)   LMP 09/18/2011   BMI 31.76 kg/m   Specialty Comments:  No specialty comments available.  PMFS History: Patient Active Problem List   Diagnosis Date Noted  . Dehiscence of amputation stump ([place])   . S/P transmetatarsal amputation of foot, right ([place]) 04/24/2018  . Wound dehiscence, surgical   . Cardiac arrest ([place]) 03/19/2018  . PAF (paroxysmal atrial fibrillation) ([place])   . Respiratory distress   . Pulmonary infiltrates   . Ventilator associated pneumonia ([place])   . Oxygen dependent   . Hypotension   . Respiratory failure ([place])   . Type 2 diabetes mellitus with diabetic neuropathy ([place])   . Abscess of toe of right foot 02/26/2018  . Diabetes mellitus type 2, uncontrolled ([place]) 02/26/2018  . Toe infection   . Diabetic polyneuropathy associated with type 2 diabetes mellitus ([place])   . Osteomyelitis of second toe of right foot ([place])   . Severe  protein-calorie malnutrition ([place])   . Open toe fracture 02/25/2018  . Seasonal allergies 04/12/2017  . Post-menopausal bleeding 12/07/2015  . ESRD on dialysis ([place]) 05/25/2015  . Hypoalbuminemia   . Abnormal appearance of cervix 08/08/2011  . Back pain 08/07/2011  . GERD (gastroesophageal reflux disease) 04/27/2010  . Hyperlipidemia 03/08/2006  . Morbid obesity ([place]) 03/08/2006  . Essential hypertension with morbid obesity 03/08/2006   Past Medical History:  Diagnosis Date  . Cardiac arrest ([place]) 02/28/2018   while on hemodialysis in hospital  . CHF (congestive heart failure) ([place])   . Chronic kidney disease due to diabetes mellitus ([place]) 10/13/2013  . Diabetes mellitus    Type II  . ESRD (end stage renal disease) ([place])    MWF - [facility]  . Hyperlipidemia   . Hypertension   . Neuropathy     Family History  Problem Relation Age of Onset  . Heart disease Mother   . Heart disease Father     Past Surgical History:  Procedure Laterality Date  . AMPUTATION Right 02/27/2018   Procedure: RIGHT FOOT 2ND AND 3RD RAY AMPUTATION;  Surgeon: [name], MD;  Location: [facility];  Service: Orthopedics;  Laterality: Right;  . AMPUTATION Right 04/24/2018   Procedure: RIGHT TRANSMETATARSAL AMPUTATION, PLACE VAC;  Surgeon: [name], MD;  Location: [facility];  Service: Orthopedics;  Laterality: Right;  . AV FISTULA PLACEMENT Left 04/15/2015   Procedure: ARTERIOVENOUS (AV) FISTULA CREATION;  Surgeon: Mal Misty, MD;  Location: [facility];  Service: Vascular;  Laterality: Left;  . EYE SURGERY Right    retinal detachment  . FISTULA SUPERFICIALIZATION Left 06/03/2015   Procedure: LEFT UPPER ARM BRACHIOCEPHALIC FISTULA SUPERFICIALIZATION;  Surgeon: Mal Misty, MD;  Location: [facility];  Service: Vascular;  Laterality: Left;  from [place] to General  . STUMP REVISION Right 05/24/2018   Procedure: REVISION RIGHT TRANSMETATARSAL AMPUTATION WITH EXCISION BONE/SOFT TISSUE, LOCAL TISSUE   RE-ARRANGEMENT  AND VAC PLACEMENT;  Surgeon: [name], MD;  Location: [facility];  Service: Orthopedics;  Laterality: Right;   Social History   Occupational History  . Not on file  Tobacco Use  . Smoking status: Never Smoker  . Smokeless tobacco: Never Used  Substance and Sexual Activity  . Alcohol use: No    Alcohol/week: 0.0 standard drinks  . Drug use: No  . Sexual activity: Never

## 2018-07-05 ENCOUNTER — Telehealth: Payer: Self-pay | Admitting: Radiology

## 2018-07-05 NOTE — Telephone Encounter (Signed)
On hold for few weeks, to due wound break down, he is calling to check the status of WTB status, and to see if they need to keep her on hold. Please call him to advise.

## 2018-07-05 NOTE — Telephone Encounter (Signed)
Called to advise that the pt should continue to remain non weight bearing. She ws in the office this week and discussions again about surgery were had and the pt is resistant to that at the time. Will call with any additional questions.

## 2018-07-08 ENCOUNTER — Other Ambulatory Visit: Payer: Self-pay | Admitting: Physician Assistant

## 2018-07-08 DIAGNOSIS — Z89431 Acquired absence of right foot: Secondary | ICD-10-CM

## 2018-07-18 ENCOUNTER — Encounter: Payer: Self-pay | Admitting: Orthopedic Surgery

## 2018-07-18 ENCOUNTER — Other Ambulatory Visit: Payer: Self-pay

## 2018-07-18 ENCOUNTER — Ambulatory Visit (INDEPENDENT_AMBULATORY_CARE_PROVIDER_SITE_OTHER): Payer: Medicare Other | Admitting: Orthopedic Surgery

## 2018-07-18 VITALS — Ht 64.0 in | Wt 185.0 lb

## 2018-07-18 DIAGNOSIS — Z89431 Acquired absence of right foot: Secondary | ICD-10-CM

## 2018-07-18 DIAGNOSIS — I96 Gangrene, not elsewhere classified: Secondary | ICD-10-CM

## 2018-07-18 DIAGNOSIS — T8781 Dehiscence of amputation stump: Secondary | ICD-10-CM

## 2018-07-18 NOTE — Progress Notes (Signed)
Office Visit Note   Patient: Rachel Chaney           Date of Birth: June 19, 1960           MRN: [id number] Visit Date: 07/18/2018              Requested by: Benay Pike, MD [address],  [place] [address] PCP: Benay Pike, MD  Chief Complaint  Patient presents with  . Right Foot - Routine Post Op    05/24/18 revision right TMA      HPI: Patient is a 58 year old woman who presents she is status post transmetatarsal amputation on the right she is 7 weeks out.  She is finishing up her course of antibiotics he is still using a nitroglycerin patch she denies any odor or drainage.  Assessment & Plan: Visit Diagnoses:  1. History of transmetatarsal amputation of right foot ([place])   2. Dehiscence of amputation stump ([facility])   3. Gangrene of right foot ([place])     Plan: Again discussed recommendations to proceed with revision amputation.  Patient states she feels like she is getting better would like to continue her current care she will wash with soap and water daily she request follow-up in 3 weeks.  Follow-Up Instructions: Return in about 3 weeks (around 08/08/2018).   Ortho Exam  Patient is alert, oriented, no adenopathy, well-dressed, normal affect, normal respiratory effort. Examination there is still a persistent wound over the first ray there is exposed first metatarsal there is no abscess no purulence no ascending cellulitis.  The remainder in the incision is healed well we will harvest the sutures.  She has a black eschar over the heel which is stable.  This is 3 cm in diameter.  Imaging: No results found. No images are attached to the encounter.  Labs: Lab Results  Component Value Date   HGBA1C 11.4 (H) 02/26/2018   HGBA1C 9.1 (A) 08/23/2017   HGBA1C 8.6 04/12/2017   ESRSEDRATE 58 (H) 04/12/2015   ESRSEDRATE 85 (H) 09/11/2013   ESRSEDRATE 77 (H) 10/16/2011   CRP 0.6 04/12/2015   REPTSTATUS 03/15/2018 FINAL 03/10/2018   GRAMSTAIN  02/27/2018   ABUNDANT WBC PRESENT, PREDOMINANTLY PMN ABUNDANT GRAM POSITIVE COCCI    CULT  03/10/2018    NO GROWTH 5 DAYS Performed at [facility], [place] [address], [place], [place] [address]    LABORGA STAPHYLOCOCCUS AUREUS 02/27/2018     Lab Results  Component Value Date   ALBUMIN 2.5 (L) 04/25/2018   ALBUMIN 2.3 (L) 03/13/2018   ALBUMIN 2.2 (L) 03/11/2018    Lab Results  Component Value Date   MG 2.2 03/07/2018   MG 2.1 03/05/2018   MG 2.1 03/04/2018   No results found for: VD25OH  No results found for: PREALBUMIN CBC EXTENDED Latest Ref Rng & Units 05/24/2018 04/25/2018 04/24/2018  WBC 4.0 - 10.5 K/uL - 18.1(H) -  RBC 3.87 - 5.11 MIL/uL - 3.41(L) -  HGB 12.0 - 15.0 g/dL 14.6 10.8(L) 11.2(L)  HCT 36.0 - 46.0 % 43.0 36.1 33.0(L)  PLT 150 - 400 K/uL - 323 -  NEUTROABS 1.7 - 7.7 K/uL - - -  LYMPHSABS 0.7 - 4.0 K/uL - - -     Body mass index is 31.76 kg/m.  Orders:  No orders of the defined types were placed in this encounter.  No orders of the defined types were placed in this encounter.    Procedures: No procedures performed  Clinical Data: No additional findings.  ROS:  All other systems negative, except as noted in the HPI. Review of Systems  Objective: Vital Signs: Ht _0  (1.626 m)   Wt 185 lb (83.9 kg)   LMP 09/18/2011   BMI 31.76 kg/m   Specialty Comments:  No specialty comments available.  PMFS History: Patient Active Problem List   Diagnosis Date Noted  . Dehiscence of amputation stump ([place])   . S/P transmetatarsal amputation of foot, right ([place]) 04/24/2018  . Wound dehiscence, surgical   . Cardiac arrest ([place]) 03/19/2018  . PAF (paroxysmal atrial fibrillation) ([place])   . Respiratory distress   . Pulmonary infiltrates   . Ventilator associated pneumonia ([place])   . Oxygen dependent   . Hypotension   . Respiratory failure ([place])   . Type 2 diabetes mellitus with diabetic neuropathy ([place])   . Abscess of toe of right foot 02/26/2018  .  Diabetes mellitus type 2, uncontrolled ([place]) 02/26/2018  . Toe infection   . Diabetic polyneuropathy associated with type 2 diabetes mellitus ([place])   . Osteomyelitis of second toe of right foot ([place])   . Severe protein-calorie malnutrition ([place])   . Open toe fracture 02/25/2018  . Seasonal allergies 04/12/2017  . Post-menopausal bleeding 12/07/2015  . ESRD on dialysis ([place]) 05/25/2015  . Hypoalbuminemia   . Abnormal appearance of cervix 08/08/2011  . Back pain 08/07/2011  . GERD (gastroesophageal reflux disease) 04/27/2010  . Hyperlipidemia 03/08/2006  . Morbid obesity ([place]) 03/08/2006  . Essential hypertension with morbid obesity 03/08/2006   Past Medical History:  Diagnosis Date  . Cardiac arrest ([place]) 02/28/2018   while on hemodialysis in hospital  . CHF (congestive heart failure) ([place])   . Chronic kidney disease due to diabetes mellitus ([place]) 10/13/2013  . Diabetes mellitus    Type II  . ESRD (end stage renal disease) ([place])    MWF - [facility]  . Hyperlipidemia   . Hypertension   . Neuropathy     Family History  Problem Relation Age of Onset  . Heart disease Mother   . Heart disease Father     Past Surgical History:  Procedure Laterality Date  . AMPUTATION Right 02/27/2018   Procedure: RIGHT FOOT 2ND AND 3RD RAY AMPUTATION;  Surgeon: [name], MD;  Location: [facility];  Service: Orthopedics;  Laterality: Right;  . AMPUTATION Right 04/24/2018   Procedure: RIGHT TRANSMETATARSAL AMPUTATION, PLACE VAC;  Surgeon: [name], MD;  Location: [facility];  Service: Orthopedics;  Laterality: Right;  . AV FISTULA PLACEMENT Left 04/15/2015   Procedure: ARTERIOVENOUS (AV) FISTULA CREATION;  Surgeon: Mal Misty, MD;  Location: [facility];  Service: Vascular;  Laterality: Left;  . EYE SURGERY Right    retinal detachment  . FISTULA SUPERFICIALIZATION Left 06/03/2015   Procedure: LEFT UPPER ARM BRACHIOCEPHALIC FISTULA SUPERFICIALIZATION;  Surgeon: Mal Misty, MD;  Location: [facility];  Service: Vascular;  Laterality: Left;  from [place] to General  . STUMP REVISION Right 05/24/2018   Procedure: REVISION RIGHT TRANSMETATARSAL AMPUTATION WITH EXCISION BONE/SOFT TISSUE, LOCAL TISSUE  RE-ARRANGEMENT  AND VAC PLACEMENT;  Surgeon: [name], MD;  Location: [facility];  Service: Orthopedics;  Laterality: Right;   Social History   Occupational History  . Not on file  Tobacco Use  . Smoking status: Never Smoker  . Smokeless tobacco: Never Used  Substance and Sexual Activity  . Alcohol use: No    Alcohol/week: 0.0 standard drinks  .  Drug use: No  . Sexual activity: Never

## 2018-07-25 ENCOUNTER — Telehealth: Payer: Self-pay

## 2018-07-25 NOTE — Telephone Encounter (Signed)
HHPT therapist from [facility] called wanted to let you know that they have discharged patient due to non compliance and also per request from patient.  [phone] if you need to call therapist back.

## 2018-07-25 NOTE — Telephone Encounter (Signed)
Ok thank you will advise Dr Sharol Given

## 2018-08-07 ENCOUNTER — Other Ambulatory Visit: Payer: Self-pay | Admitting: Physician Assistant

## 2018-08-07 ENCOUNTER — Telehealth: Payer: Self-pay | Admitting: *Deleted

## 2018-08-07 DIAGNOSIS — Z89431 Acquired absence of right foot: Secondary | ICD-10-CM

## 2018-08-07 NOTE — Telephone Encounter (Signed)
Pt lm on nurse line for a refill on her muscle relaxer.   Last appt with Dr. Shawna Orleans in May.  Will need appt for refill.   No answer and no machine.  Will await callback. Christen Bame, CMA

## 2018-08-08 ENCOUNTER — Encounter: Payer: Self-pay | Admitting: Orthopedic Surgery

## 2018-08-08 ENCOUNTER — Other Ambulatory Visit: Payer: Self-pay

## 2018-08-08 ENCOUNTER — Telehealth (INDEPENDENT_AMBULATORY_CARE_PROVIDER_SITE_OTHER): Payer: Medicare Other | Admitting: Family Medicine

## 2018-08-08 ENCOUNTER — Ambulatory Visit (INDEPENDENT_AMBULATORY_CARE_PROVIDER_SITE_OTHER): Payer: Medicare Other | Admitting: Orthopedic Surgery

## 2018-08-08 VITALS — Ht 64.0 in | Wt 185.0 lb

## 2018-08-08 DIAGNOSIS — T8781 Dehiscence of amputation stump: Secondary | ICD-10-CM

## 2018-08-08 DIAGNOSIS — Z89431 Acquired absence of right foot: Secondary | ICD-10-CM

## 2018-08-08 DIAGNOSIS — Z760 Encounter for issue of repeat prescription: Secondary | ICD-10-CM | POA: Diagnosis not present

## 2018-08-08 NOTE — Progress Notes (Signed)
Office Visit Note   Patient: Rachel Chaney           Date of Birth: Apr 11, 1960           MRN: [id number] Visit Date: 08/08/2018              Requested by: Benay Pike, MD [address] [place],  [place] [address] PCP: Benay Pike, MD  Chief Complaint  Patient presents with  . Right Foot - Routine Post Op      HPI: Patient is a 58 year old woman who is seen in follow-up for right transmetatarsal amputation with wound dehiscence medially.  She is currently using a nitroglycerin patch and daily dressing changes.  Patient denies any systemic symptoms.  Assessment & Plan: Visit Diagnoses:  1. History of transmetatarsal amputation of right foot ([place])   2. Dehiscence of amputation stump ([place])     Plan: Continue with wound care continue the nitroglycerin patch she may advance to regular shoewear.  Follow-Up Instructions: Return in about 3 weeks (around 08/29/2018).   Ortho Exam  Patient is alert, oriented, no adenopathy, well-dressed, normal affect, normal respiratory effort. Examination patient has no redness no cellulitis in the right foot.  The area of wound dehiscence was still present this about 15 mm in diameter 5 mm deep there is exposed bone there is granulation tissue around the wound edges.  There is no purulent drainage.  Again discussed recommendations to proceed with revision surgery secondary to the exposed bone and osteomyelitis.  Patient states she still wants to proceed with nonoperative treatment.  Imaging: No results found. No images are attached to the encounter.  Labs: Lab Results  Component Value Date   HGBA1C 11.4 (H) 02/26/2018   HGBA1C 9.1 (A) 08/23/2017   HGBA1C 8.6 04/12/2017   ESRSEDRATE 58 (H) 04/12/2015   ESRSEDRATE 85 (H) 09/11/2013   ESRSEDRATE 77 (H) 10/16/2011   CRP 0.6 04/12/2015   REPTSTATUS 03/15/2018 FINAL 03/10/2018   GRAMSTAIN  02/27/2018    ABUNDANT WBC PRESENT, PREDOMINANTLY PMN ABUNDANT GRAM POSITIVE COCCI    CULT  03/10/2018    NO GROWTH 5 DAYS Performed at [facility], [place] [address], [place], [place] [address]    LABORGA STAPHYLOCOCCUS AUREUS 02/27/2018     Lab Results  Component Value Date   ALBUMIN 2.5 (L) 04/25/2018   ALBUMIN 2.3 (L) 03/13/2018   ALBUMIN 2.2 (L) 03/11/2018    Lab Results  Component Value Date   MG 2.2 03/07/2018   MG 2.1 03/05/2018   MG 2.1 03/04/2018   No results found for: VD25OH  No results found for: PREALBUMIN CBC EXTENDED Latest Ref Rng & Units 05/24/2018 04/25/2018 04/24/2018  WBC 4.0 - 10.5 K/uL - 18.1(H) -  RBC 3.87 - 5.11 MIL/uL - 3.41(L) -  HGB 12.0 - 15.0 g/dL 14.6 10.8(L) 11.2(L)  HCT 36.0 - 46.0 % 43.0 36.1 33.0(L)  PLT 150 - 400 K/uL - 323 -  NEUTROABS 1.7 - 7.7 K/uL - - -  LYMPHSABS 0.7 - 4.0 K/uL - - -     Body mass index is 31.76 kg/m.  Orders:  No orders of the defined types were placed in this encounter.  No orders of the defined types were placed in this encounter.    Procedures: No procedures performed  Clinical Data: No additional findings.  ROS:  All other systems negative, except as noted in the HPI. Review of Systems  Objective: Vital Signs: Ht _0  (1.626 m)  Wt 185 lb (83.9 kg)   LMP 09/18/2011   BMI 31.76 kg/m   Specialty Comments:  No specialty comments available.  PMFS History: Patient Active Problem List   Diagnosis Date Noted  . Dehiscence of amputation stump ([place])   . S/P transmetatarsal amputation of foot, right ([place]) 04/24/2018  . Wound dehiscence, surgical   . Cardiac arrest ([place]) 03/19/2018  . PAF (paroxysmal atrial fibrillation) ([place])   . Respiratory distress   . Pulmonary infiltrates   . Ventilator associated pneumonia ([place])   . Oxygen dependent   . Hypotension   . Respiratory failure ([place])   . Type 2 diabetes mellitus with diabetic neuropathy ([place])   . Abscess of toe of right foot 02/26/2018  . Diabetes mellitus type 2, uncontrolled ([place]) 02/26/2018  . Toe infection   .  Diabetic polyneuropathy associated with type 2 diabetes mellitus ([place])   . Osteomyelitis of second toe of right foot ([place])   . Severe protein-calorie malnutrition ([place])   . Open toe fracture 02/25/2018  . Seasonal allergies 04/12/2017  . Post-menopausal bleeding 12/07/2015  . ESRD on dialysis ([place]) 05/25/2015  . Hypoalbuminemia   . Abnormal appearance of cervix 08/08/2011  . Back pain 08/07/2011  . GERD (gastroesophageal reflux disease) 04/27/2010  . Hyperlipidemia 03/08/2006  . Morbid obesity ([place]) 03/08/2006  . Essential hypertension with morbid obesity 03/08/2006   Past Medical History:  Diagnosis Date  . Cardiac arrest ([place]) 02/28/2018   while on hemodialysis in hospital  . CHF (congestive heart failure) ([place])   . Chronic kidney disease due to diabetes mellitus ([place]) 10/13/2013  . Diabetes mellitus    Type II  . ESRD (end stage renal disease) ([place])    MWF - [facility]  . Hyperlipidemia   . Hypertension   . Neuropathy     Family History  Problem Relation Age of Onset  . Heart disease Mother   . Heart disease Father     Past Surgical History:  Procedure Laterality Date  . AMPUTATION Right 02/27/2018   Procedure: RIGHT FOOT 2ND AND 3RD RAY AMPUTATION;  Surgeon: [name], MD;  Location: [facility];  Service: Orthopedics;  Laterality: Right;  . AMPUTATION Right 04/24/2018   Procedure: RIGHT TRANSMETATARSAL AMPUTATION, PLACE VAC;  Surgeon: [name], MD;  Location: [facility];  Service: Orthopedics;  Laterality: Right;  . AV FISTULA PLACEMENT Left 04/15/2015   Procedure: ARTERIOVENOUS (AV) FISTULA CREATION;  Surgeon: Mal Misty, MD;  Location: [facility];  Service: Vascular;  Laterality: Left;  . EYE SURGERY Right    retinal detachment  . FISTULA SUPERFICIALIZATION Left 06/03/2015   Procedure: LEFT UPPER ARM BRACHIOCEPHALIC FISTULA SUPERFICIALIZATION;  Surgeon: Mal Misty, MD;  Location: [facility];  Service: Vascular;  Laterality: Left;  from [place] to General  . STUMP  REVISION Right 05/24/2018   Procedure: REVISION RIGHT TRANSMETATARSAL AMPUTATION WITH EXCISION BONE/SOFT TISSUE, LOCAL TISSUE  RE-ARRANGEMENT  AND VAC PLACEMENT;  Surgeon: [name], MD;  Location: [facility];  Service: Orthopedics;  Laterality: Right;   Social History   Occupational History  . Not on file  Tobacco Use  . Smoking status: Never Smoker  . Smokeless tobacco: Never Used  Substance and Sexual Activity  . Alcohol use: No    Alcohol/week: 0.0 standard drinks  . Drug use: No  . Sexual activity: Never

## 2018-08-08 NOTE — Progress Notes (Addendum)
[facility] Telemedicine Visit  Patient consented to have virtual visit. Method of visit: Telephone  Encounter participants: Patient: Rachel Chaney - located at home Provider: Bonnita [place] - located at office Others (if applicable): none  Chief Complaint: medication refill  HPI:  Rachel Chaney is a 58 y.o. female who presents with concern of needing her Flexeril refilled.  Patient has history of with chronic lower back pain.  Her back pain is not worsening.  The Flexeril improves the back pain.  Patient takes 10 mg nightly.  ROS: Denies incontinence or weakness  Pertinent PMHx: Chronic lower back  Exam:  Respiratory: Able to speak in full sentences without issue  Assessment/Plan: Medication refill We will refill Flexeril for 3 months at 10 mg nightly.  Time spent during visit with patient: 5 minutes

## 2018-08-09 ENCOUNTER — Telehealth: Payer: Self-pay | Admitting: *Deleted

## 2018-08-09 MED ORDER — CYCLOBENZAPRINE HCL 10 MG PO TABS: 10.0000 mg | ORAL_TABLET | Freq: Two times a day (BID) | ORAL | 0 refills | Status: DC | PRN

## 2018-08-09 NOTE — Telephone Encounter (Signed)
Medication sent in as requested.

## 2018-08-09 NOTE — Telephone Encounter (Signed)
Pt is calling because flexril was never called in.  To Dr. Grandville Silos. Christen Bame, CMA

## 2018-08-09 NOTE — Telephone Encounter (Signed)
Patient informed.  Jazmin Hartsell,CMA  

## 2018-08-29 ENCOUNTER — Ambulatory Visit (INDEPENDENT_AMBULATORY_CARE_PROVIDER_SITE_OTHER): Payer: Medicare Other | Admitting: Orthopedic Surgery

## 2018-08-29 ENCOUNTER — Other Ambulatory Visit: Payer: Self-pay

## 2018-08-29 ENCOUNTER — Telehealth: Payer: Self-pay | Admitting: Orthopedic Surgery

## 2018-08-29 ENCOUNTER — Encounter: Payer: Self-pay | Admitting: Orthopedic Surgery

## 2018-08-29 VITALS — Ht 64.0 in | Wt 185.0 lb

## 2018-08-29 DIAGNOSIS — T8781 Dehiscence of amputation stump: Secondary | ICD-10-CM | POA: Diagnosis not present

## 2018-08-29 DIAGNOSIS — Z89431 Acquired absence of right foot: Secondary | ICD-10-CM

## 2018-08-29 NOTE — Telephone Encounter (Signed)
Patient called advised she went to [facility] for a fitting of a shoe. Patient said she was advised [facility] no longer fit for shoes. The number to contact patient is [phone]

## 2018-08-30 ENCOUNTER — Encounter: Payer: Self-pay | Admitting: Orthopedic Surgery

## 2018-08-30 NOTE — Progress Notes (Signed)
Office Visit Note   Patient: Rachel Chaney           Date of Birth: [birth year]           MRN: [id number] Visit Date: 08/29/2018              Requested by: Benay Pike, MD [address],  [place] [address] PCP: Benay Pike, MD  Chief Complaint  Patient presents with  . Right Foot - Routine Post Op    05/24/18 revision right transmet amputation       HPI: Patient is a 58 year old woman who is 3 months status post revision right transmetatarsal amputation she is nonweightbearing upon arrival in a wheelchair.  She has been using a nitroglycerin patch to improve microcirculation.  Assessment & Plan: Visit Diagnoses:  1. History of transmetatarsal amputation of right foot ([place])   2. Dehiscence of amputation stump ([place])     Plan: Plan, we will have her continue the nitroglycerin patch Iodosorb was applied today she will continue with her current cleansing and topical dressing.  Patient was also given a prescription for extra-depth shoe custom orthotic spacer and carbon plate with [employer].  Patient did not want to consider bracing.  Follow-Up Instructions: Return in about 4 weeks (around 09/26/2018).   Ortho Exam  Patient is alert, oriented, no adenopathy, well-dressed, normal affect, normal respiratory effort. Examination patient does have 10 degrees equinus contracture ankle on the right discussed the importance of Achilles stretching to unload the forefoot of the transmetatarsal ulcer.  Examination the wound bed is filling in the ulcer is 10 mm in diameter 1 mm deep she has some hard callus over the heel that appears to be peeling off.  Imaging: No results found. No images are attached to the encounter.  Labs: Lab Results  Component Value Date   HGBA1C 11.4 (H) 02/26/2018   HGBA1C 9.1 (A) 08/23/2017   HGBA1C 8.6 04/12/2017   ESRSEDRATE 58 (H) 04/12/2015   ESRSEDRATE 85 (H) 09/11/2013   ESRSEDRATE 77 (H) 10/16/2011   CRP 0.6 04/12/2015   REPTSTATUS  03/15/2018 FINAL 03/10/2018   GRAMSTAIN  02/27/2018    ABUNDANT WBC PRESENT, PREDOMINANTLY PMN ABUNDANT GRAM POSITIVE COCCI    CULT  03/10/2018    NO GROWTH 5 DAYS Performed at [facility], [place] [address] [place], [place] [address]    LABORGA STAPHYLOCOCCUS AUREUS 02/27/2018     Lab Results  Component Value Date   ALBUMIN 2.5 (L) 04/25/2018   ALBUMIN 2.3 (L) 03/13/2018   ALBUMIN 2.2 (L) 03/11/2018    Lab Results  Component Value Date   MG 2.2 03/07/2018   MG 2.1 03/05/2018   MG 2.1 03/04/2018   No results found for: VD25OH  No results found for: PREALBUMIN CBC EXTENDED Latest Ref Rng & Units 05/24/2018 04/25/2018 04/24/2018  WBC 4.0 - 10.5 K/uL - 18.1(H) -  RBC 3.87 - 5.11 MIL/uL - 3.41(L) -  HGB 12.0 - 15.0 g/dL 14.6 10.8(L) 11.2(L)  HCT 36.0 - 46.0 % 43.0 36.1 33.0(L)  PLT 150 - 400 K/uL - 323 -  NEUTROABS 1.7 - 7.7 K/uL - - -  LYMPHSABS 0.7 - 4.0 K/uL - - -     Body mass index is 31.76 kg/m.  Orders:  No orders of the defined types were placed in this encounter.  No orders of the defined types were placed in this encounter.    Procedures: No procedures performed  Clinical Data: No  additional findings.  ROS:  All other systems negative, except as noted in the HPI. Review of Systems  Objective: Vital Signs: Ht _0  (1.626 m)   Wt 185 lb (83.9 kg)   LMP 09/18/2011   BMI 31.76 kg/m   Specialty Comments:  No specialty comments available.  PMFS History: Patient Active Problem List   Diagnosis Date Noted  . Dehiscence of amputation stump ([place])   . S/P transmetatarsal amputation of foot, right ([place]) 04/24/2018  . Wound dehiscence, surgical   . Cardiac arrest ([place]) 03/19/2018  . PAF (paroxysmal atrial fibrillation) ([place])   . Respiratory distress   . Pulmonary infiltrates   . Ventilator associated pneumonia ([place])   . Oxygen dependent   . Hypotension   . Respiratory failure ([place])   . Type 2 diabetes mellitus with diabetic neuropathy  ([place])   . Abscess of toe of right foot 02/26/2018  . Diabetes mellitus type 2, uncontrolled ([place]) 02/26/2018  . Toe infection   . Diabetic polyneuropathy associated with type 2 diabetes mellitus ([place])   . Osteomyelitis of second toe of right foot ([place])   . Severe protein-calorie malnutrition ([place])   . Open toe fracture 02/25/2018  . Seasonal allergies 04/12/2017  . Post-menopausal bleeding 12/07/2015  . ESRD on dialysis ([place]) 05/25/2015  . Hypoalbuminemia   . Abnormal appearance of cervix 08/08/2011  . Back pain 08/07/2011  . GERD (gastroesophageal reflux disease) 04/27/2010  . Hyperlipidemia 03/08/2006  . Morbid obesity ([place]) 03/08/2006  . Essential hypertension with morbid obesity 03/08/2006   Past Medical History:  Diagnosis Date  . Cardiac arrest ([place]) 02/28/2018   while on hemodialysis in hospital  . CHF (congestive heart failure) ([place])   . Chronic kidney disease due to diabetes mellitus ([place]) 10/13/2013  . Diabetes mellitus    Type II  . ESRD (end stage renal disease) ([place])    MWF - [facility]  . Hyperlipidemia   . Hypertension   . Neuropathy     Family History  Problem Relation Age of Onset  . Heart disease Mother   . Heart disease Father     Past Surgical History:  Procedure Laterality Date  . AMPUTATION Right 02/27/2018   Procedure: RIGHT FOOT 2ND AND 3RD RAY AMPUTATION;  Surgeon: [name], MD;  Location: [place];  Service: Orthopedics;  Laterality: Right;  . AMPUTATION Right 04/24/2018   Procedure: RIGHT TRANSMETATARSAL AMPUTATION, PLACE VAC;  Surgeon: [name], MD;  Location: [place];  Service: Orthopedics;  Laterality: Right;  . AV FISTULA PLACEMENT Left 04/15/2015   Procedure: ARTERIOVENOUS (AV) FISTULA CREATION;  Surgeon: Mal Misty, MD;  Location: [place];  Service: Vascular;  Laterality: Left;  . EYE SURGERY Right    retinal detachment  . FISTULA SUPERFICIALIZATION Left 06/03/2015   Procedure: LEFT UPPER ARM BRACHIOCEPHALIC FISTULA  SUPERFICIALIZATION;  Surgeon: Mal Misty, MD;  Location: [place];  Service: Vascular;  Laterality: Left;  from [place] to General  . STUMP REVISION Right 05/24/2018   Procedure: REVISION RIGHT TRANSMETATARSAL AMPUTATION WITH EXCISION BONE/SOFT TISSUE, LOCAL TISSUE  RE-ARRANGEMENT  AND VAC PLACEMENT;  Surgeon: [name], MD;  Location: [place];  Service: Orthopedics;  Laterality: Right;   Social History   Occupational History  . Not on file  Tobacco Use  . Smoking status: Never Smoker  . Smokeless tobacco: Never Used  Substance and Sexual Activity  . Alcohol use: No    Alcohol/week: 0.0 standard drinks  . Drug use:  No  . Sexual activity: Never

## 2018-09-02 NOTE — Telephone Encounter (Signed)
Patient was called and could not leave message, stated the box was full. Patient was called to be inform that [facility]) states that they are trying to decrease the patient load for only shoe fitting without brace due to insurances not paying for it; especially if patient is not a diabetic. She also notes that they are advising their patients that they can get the shoe but will have to pay out of pocket for it and the patients are refusing. I will hold this message to try and contact patient and inform her of this and also to see if there is anything else she would like for [place] to do in the meantime.

## 2018-09-06 ENCOUNTER — Other Ambulatory Visit: Payer: Self-pay | Admitting: Physician Assistant

## 2018-09-06 DIAGNOSIS — Z89431 Acquired absence of right foot: Secondary | ICD-10-CM

## 2018-09-26 ENCOUNTER — Ambulatory Visit (INDEPENDENT_AMBULATORY_CARE_PROVIDER_SITE_OTHER): Payer: Medicare Other | Admitting: Orthopedic Surgery

## 2018-09-26 ENCOUNTER — Encounter: Payer: Self-pay | Admitting: Orthopedic Surgery

## 2018-09-26 ENCOUNTER — Other Ambulatory Visit: Payer: Self-pay

## 2018-09-26 VITALS — Ht 64.0 in | Wt 185.0 lb

## 2018-09-26 DIAGNOSIS — Z89431 Acquired absence of right foot: Secondary | ICD-10-CM

## 2018-09-26 DIAGNOSIS — T8781 Dehiscence of amputation stump: Secondary | ICD-10-CM

## 2018-10-01 ENCOUNTER — Encounter: Payer: Self-pay | Admitting: Orthopedic Surgery

## 2018-10-01 NOTE — Progress Notes (Signed)
Office Visit Note   Patient: Rachel Chaney           Date of Birth: [birth year]           MRN: [id number] Visit Date: 09/26/2018              Requested by: Benay Pike, MD [address],  [place] [address] PCP: Benay Pike, MD  Chief Complaint  Patient presents with  . Right Foot - Routine Post Op    05/24/18 revision right transmet amputation       HPI: Patient is 4 months status post right transmetatarsal amputation she has prolonged wound healing with an ulcer over the medial column.  Is weightbearing in regular shoe wearing a walker.  Assessment & Plan: Visit Diagnoses: No diagnosis found.  Plan: Plan: Recommend continue with antibiotic ointment and a Band-Aid continue with activities as tolerated.  Follow-Up Instructions: Return in about 3 months (around 12/26/2018).   Ortho Exam  Patient is alert, oriented, no adenopathy, well-dressed, normal affect, normal respiratory effort. Examination patient has excellent healing of the medial column ulcer.  This has completely filled plan.  The ulcer is 10 mm in diameter 1 mm deep with healthy granulation tissue.  Patient does have a prescription for carbon plate spacer orthotics and bracing she states that she will get this in October.  Imaging: No results found. No images are attached to the encounter.  Labs: Lab Results  Component Value Date   HGBA1C 11.4 (H) 02/26/2018   HGBA1C 9.1 (A) 08/23/2017   HGBA1C 8.6 04/12/2017   ESRSEDRATE 58 (H) 04/12/2015   ESRSEDRATE 85 (H) 09/11/2013   ESRSEDRATE 77 (H) 10/16/2011   CRP 0.6 04/12/2015   REPTSTATUS 03/15/2018 FINAL 03/10/2018   GRAMSTAIN  02/27/2018    ABUNDANT WBC PRESENT, PREDOMINANTLY PMN ABUNDANT GRAM POSITIVE COCCI    CULT  03/10/2018    NO GROWTH 5 DAYS Performed at [facility], [place] [address] [place], [place] [address]    LABORGA STAPHYLOCOCCUS AUREUS 02/27/2018     Lab Results  Component Value Date   ALBUMIN 2.5 (L)  04/25/2018   ALBUMIN 2.3 (L) 03/13/2018   ALBUMIN 2.2 (L) 03/11/2018    Lab Results  Component Value Date   MG 2.2 03/07/2018   MG 2.1 03/05/2018   MG 2.1 03/04/2018   No results found for: VD25OH  No results found for: PREALBUMIN CBC EXTENDED Latest Ref Rng & Units 05/24/2018 04/25/2018 04/24/2018  WBC 4.0 - 10.5 K/uL - 18.1(H) -  RBC 3.87 - 5.11 MIL/uL - 3.41(L) -  HGB 12.0 - 15.0 g/dL 14.6 10.8(L) 11.2(L)  HCT 36.0 - 46.0 % 43.0 36.1 33.0(L)  PLT 150 - 400 K/uL - 323 -  NEUTROABS 1.7 - 7.7 K/uL - - -  LYMPHSABS 0.7 - 4.0 K/uL - - -     Body mass index is 31.76 kg/m.  Orders:  No orders of the defined types were placed in this encounter.  No orders of the defined types were placed in this encounter.    Procedures: No procedures performed  Clinical Data: No additional findings.  ROS:  All other systems negative, except as noted in the HPI. Review of Systems  Objective: Vital Signs: Ht _0  (1.626 m)   Wt 185 lb (83.9 kg)   LMP 09/18/2011   BMI 31.76 kg/m   Specialty Comments:  No specialty comments available.  PMFS History: Patient Active Problem List   Diagnosis  Date Noted  . Dehiscence of amputation stump ([place])   . S/P transmetatarsal amputation of foot, right ([place]) 04/24/2018  . Wound dehiscence, surgical   . Cardiac arrest ([place]) 03/19/2018  . PAF (paroxysmal atrial fibrillation) ([place])   . Respiratory distress   . Pulmonary infiltrates   . Ventilator associated pneumonia ([place])   . Oxygen dependent   . Hypotension   . Respiratory failure ([place])   . Type 2 diabetes mellitus with diabetic neuropathy ([place])   . Abscess of toe of right foot 02/26/2018  . Diabetes mellitus type 2, uncontrolled ([place]) 02/26/2018  . Toe infection   . Diabetic polyneuropathy associated with type 2 diabetes mellitus ([place])   . Osteomyelitis of second toe of right foot ([place])   . Severe protein-calorie malnutrition ([place])   . Open toe fracture 02/25/2018  . Seasonal  allergies 04/12/2017  . Post-menopausal bleeding 12/07/2015  . ESRD on dialysis ([place]) 05/25/2015  . Hypoalbuminemia   . Abnormal appearance of cervix 08/08/2011  . Back pain 08/07/2011  . GERD (gastroesophageal reflux disease) 04/27/2010  . Hyperlipidemia 03/08/2006  . Morbid obesity ([place]) 03/08/2006  . Essential hypertension with morbid obesity 03/08/2006   Past Medical History:  Diagnosis Date  . Cardiac arrest ([place]) 02/28/2018   while on hemodialysis in hospital  . CHF (congestive heart failure) ([place])   . Chronic kidney disease due to diabetes mellitus ([place]) 10/13/2013  . Diabetes mellitus    Type II  . ESRD (end stage renal disease) ([place])    MWF - [facility]  . Hyperlipidemia   . Hypertension   . Neuropathy     Family History  Problem Relation Age of Onset  . Heart disease Mother   . Heart disease Father     Past Surgical History:  Procedure Laterality Date  . AMPUTATION Right 02/27/2018   Procedure: RIGHT FOOT 2ND AND 3RD RAY AMPUTATION;  Surgeon: [name], MD;  Location: [place];  Service: Orthopedics;  Laterality: Right;  . AMPUTATION Right 04/24/2018   Procedure: RIGHT TRANSMETATARSAL AMPUTATION, PLACE VAC;  Surgeon: [name], MD;  Location: [place];  Service: Orthopedics;  Laterality: Right;  . AV FISTULA PLACEMENT Left 04/15/2015   Procedure: ARTERIOVENOUS (AV) FISTULA CREATION;  Surgeon: Mal Misty, MD;  Location: [place];  Service: Vascular;  Laterality: Left;  . EYE SURGERY Right    retinal detachment  . FISTULA SUPERFICIALIZATION Left 06/03/2015   Procedure: LEFT UPPER ARM BRACHIOCEPHALIC FISTULA SUPERFICIALIZATION;  Surgeon: Mal Misty, MD;  Location: [place];  Service: Vascular;  Laterality: Left;  from [place] to General  . STUMP REVISION Right 05/24/2018   Procedure: REVISION RIGHT TRANSMETATARSAL AMPUTATION WITH EXCISION BONE/SOFT TISSUE, LOCAL TISSUE  RE-ARRANGEMENT  AND VAC PLACEMENT;  Surgeon: [name], MD;  Location: [place];  Service:  Orthopedics;  Laterality: Right;   Social History   Occupational History  . Not on file  Tobacco Use  . Smoking status: Never Smoker  . Smokeless tobacco: Never Used  Substance and Sexual Activity  . Alcohol use: No    Alcohol/week: 0.0 standard drinks  . Drug use: No  . Sexual activity: Never

## 2018-10-18 ENCOUNTER — Other Ambulatory Visit: Payer: Self-pay | Admitting: *Deleted

## 2018-10-18 MED ORDER — ATORVASTATIN CALCIUM 80 MG PO TABS: 80.0000 mg | ORAL_TABLET | Freq: Every day | ORAL | 3 refills | Status: DC

## 2018-11-07 ENCOUNTER — Other Ambulatory Visit: Payer: Self-pay | Admitting: Internal Medicine

## 2018-11-12 ENCOUNTER — Other Ambulatory Visit: Payer: Self-pay

## 2018-11-13 MED ORDER — ATORVASTATIN CALCIUM 80 MG PO TABS: 80.0000 mg | ORAL_TABLET | Freq: Every day | ORAL | 3 refills | Status: DC

## 2018-11-18 ENCOUNTER — Other Ambulatory Visit: Payer: Self-pay

## 2018-11-18 ENCOUNTER — Telehealth: Payer: Self-pay | Admitting: Orthopedic Surgery

## 2018-11-18 NOTE — Telephone Encounter (Signed)
Patient states that [facility] is requesting a RX for shield (something that will keep foot in shoe) .

## 2018-11-19 MED ORDER — CARVEDILOL 25 MG PO TABS: 25.0000 mg | ORAL_TABLET | Freq: Two times a day (BID) | ORAL | 0 refills | Status: DC

## 2018-11-25 ENCOUNTER — Telehealth: Payer: Self-pay | Admitting: Orthopedic Surgery

## 2018-11-25 NOTE — Telephone Encounter (Signed)
Patient called in checking on Prescription RX for [employer] for a shield, something that will keep foot in shoe.  [phone]

## 2018-11-25 NOTE — Telephone Encounter (Signed)
Rx will be faxed to [employer], patient was called and notified

## 2018-11-25 NOTE — Telephone Encounter (Signed)
Order will be faxed to [facility].

## 2018-12-26 ENCOUNTER — Ambulatory Visit: Payer: Medicare Other | Admitting: Orthopedic Surgery

## 2019-01-14 ENCOUNTER — Ambulatory Visit: Payer: Medicare Other | Admitting: Orthopedic Surgery

## 2019-01-16 ENCOUNTER — Ambulatory Visit: Payer: Medicare Other | Admitting: Orthopedic Surgery

## 2019-02-13 ENCOUNTER — Other Ambulatory Visit: Payer: Self-pay | Admitting: *Deleted

## 2019-02-13 NOTE — Telephone Encounter (Signed)
error 

## 2019-02-15 MED ORDER — CARVEDILOL 25 MG PO TABS: 25.0000 mg | ORAL_TABLET | Freq: Two times a day (BID) | ORAL | 0 refills | Status: DC

## 2019-02-18 ENCOUNTER — Other Ambulatory Visit: Payer: Self-pay

## 2019-02-18 ENCOUNTER — Telehealth (INDEPENDENT_AMBULATORY_CARE_PROVIDER_SITE_OTHER): Payer: Medicare Other | Admitting: Family Medicine

## 2019-02-18 DIAGNOSIS — I10 Essential (primary) hypertension: Secondary | ICD-10-CM | POA: Diagnosis not present

## 2019-02-18 DIAGNOSIS — E1140 Type 2 diabetes mellitus with diabetic neuropathy, unspecified: Secondary | ICD-10-CM

## 2019-02-18 DIAGNOSIS — E785 Hyperlipidemia, unspecified: Secondary | ICD-10-CM | POA: Diagnosis not present

## 2019-02-18 DIAGNOSIS — M545 Low back pain, unspecified: Secondary | ICD-10-CM

## 2019-02-18 DIAGNOSIS — G8929 Other chronic pain: Secondary | ICD-10-CM

## 2019-02-18 DIAGNOSIS — R5383 Other fatigue: Secondary | ICD-10-CM

## 2019-02-18 DIAGNOSIS — M549 Dorsalgia, unspecified: Secondary | ICD-10-CM

## 2019-02-18 DIAGNOSIS — E11649 Type 2 diabetes mellitus with hypoglycemia without coma: Secondary | ICD-10-CM

## 2019-02-18 NOTE — Progress Notes (Signed)
[facility] Telemedicine Visit  Patient consented to have virtual visit. Method of visit: Telephone  Encounter participants: Patient: Rachel Chaney - located at home Provider: Benay Pike - [facility] at [facility] Others (if applicable): none  Chief Complaint: diabetes, htn  HPI:  Diabetes : takes levimir 50U/50U BID.  Blood sugars is Usually 120-160.  Never higher than 200.  Can't take other diabetes medications because of ESRD.  currently weighs about 185 lbs and maintains a consistent weight bc of dialysis. She Misses a dose of insulin about once a week.    BP: currently carvedilol.  Nephrologist recently took her off hydralazine.  Checks bp at home and is usually 120/80.      Back pain: back pain has not been bothering her recently. Not currently taking the flexeril or norco.  Does take tylenol.   Cholesterol: currently taking her statin medication. Is mostly compliant, may miss a day or two a week.  Hasn't had lipid panel in over a year.   ROS: per HPI  Pertinent PMHx: DM, HTN, PAF, HLD,   Exam:  Respiratory: speaking in full sentences without difficulty.   Assessment/Plan:  Type 2 diabetes mellitus with diabetic neuropathy (HCC) Last a1c was >11 approximately one year ago, but this was during a hospitalizatoin for infection so may not represent true value.  Informed pt the necessity of making sure diabetes is well controlled.  Currently taking 50 U bid of levemir with reportedly good control - future lab for a1c   Hyperlipidemia Last lipid panel was in 2019.  Reports medication compliance.  Will put in order for future lipid panel.   Essential hypertension with morbid obesity Takes home bp and reports good control.  Only on carvedilol currently.   - continue home medication - continue to monitor at home  Back pain Reported improvement with cessation of narcotic and muscle relaxers.     Time spent during visit with patient: 21 minutes

## 2019-02-20 ENCOUNTER — Other Ambulatory Visit: Payer: Self-pay

## 2019-02-20 ENCOUNTER — Ambulatory Visit (INDEPENDENT_AMBULATORY_CARE_PROVIDER_SITE_OTHER): Payer: Medicare Other | Admitting: Orthopedic Surgery

## 2019-02-20 ENCOUNTER — Encounter: Payer: Self-pay | Admitting: Orthopedic Surgery

## 2019-02-20 VITALS — Ht 64.0 in | Wt 185.0 lb

## 2019-02-20 DIAGNOSIS — M869 Osteomyelitis, unspecified: Secondary | ICD-10-CM | POA: Diagnosis not present

## 2019-02-20 NOTE — Progress Notes (Signed)
Office Visit Note   Patient: Rachel Chaney           Date of Birth: 12-Nov-1960           MRN: [id number] Visit Date: 02/20/2019              Requested by: Benay Pike, MD [address],  [place] [address] PCP: Benay Pike, MD  Chief Complaint  Patient presents with  . Right Foot - Routine Post Op    05/24/18 revision right transmet amputation       HPI: This is a pleasant 59 year old woman who is 8 months status post revision right transmetatarsal amputation.  She has obtained an orthotic with a spacer however it does not seem to be fitting her properly.  She states when she wear shoes her foot.  Pulls up against the top of her tennis shoe.  She is wondering if the orthotic can be modified  Assessment & Plan: Visit Diagnoses: No diagnosis found.  Plan: I do think it is important for her to have a proper fitting orthotic so she does not create any pressure areas or friction areas on her amputation stump I have given her a prescription to return to [employer] to see if the current orthotic could be modified or she could find shoes that may be appropriate  Follow-Up Instructions: No follow-ups on file.   Ortho Exam  Patient is alert, oriented, no adenopathy, well-dressed, normal affect, normal respiratory effort. Focused examination demonstrates well-healed surgical incision she has no swelling no erythema no fluctuance.  Her amputation stump does seem somewhat Baik in the current orthotic  Imaging: No results found. No images are attached to the encounter.  Labs: Lab Results  Component Value Date   HGBA1C 11.4 (H) 02/26/2018   HGBA1C 9.1 (A) 08/23/2017   HGBA1C 8.6 04/12/2017   ESRSEDRATE 58 (H) 04/12/2015   ESRSEDRATE 85 (H) 09/11/2013   ESRSEDRATE 77 (H) 10/16/2011   CRP 0.6 04/12/2015   REPTSTATUS 03/15/2018 FINAL 03/10/2018   GRAMSTAIN  02/27/2018    ABUNDANT WBC PRESENT, PREDOMINANTLY PMN ABUNDANT GRAM POSITIVE COCCI    CULT  03/10/2018   NO GROWTH 5 DAYS Performed at [facility], [place] [address], [place], [place] [address]    LABORGA STAPHYLOCOCCUS AUREUS 02/27/2018     Lab Results  Component Value Date   ALBUMIN 2.5 (L) 04/25/2018   ALBUMIN 2.3 (L) 03/13/2018   ALBUMIN 2.2 (L) 03/11/2018    Lab Results  Component Value Date   MG 2.2 03/07/2018   MG 2.1 03/05/2018   MG 2.1 03/04/2018   No results found for: VD25OH  No results found for: PREALBUMIN CBC EXTENDED Latest Ref Rng & Units 05/24/2018 04/25/2018 04/24/2018  WBC 4.0 - 10.5 K/uL - 18.1(H) -  RBC 3.87 - 5.11 MIL/uL - 3.41(L) -  HGB 12.0 - 15.0 g/dL 14.6 10.8(L) 11.2(L)  HCT 36.0 - 46.0 % 43.0 36.1 33.0(L)  PLT 150 - 400 K/uL - 323 -  NEUTROABS 1.7 - 7.7 K/uL - - -  LYMPHSABS 0.7 - 4.0 K/uL - - -     Body mass index is 31.76 kg/m.  Orders:  No orders of the defined types were placed in this encounter.  No orders of the defined types were placed in this encounter.    Procedures: No procedures performed  Clinical Data: No additional findings.  ROS:  All other systems negative, except as noted in the HPI. Review of  Systems  Objective: Vital Signs: Ht _0  (1.626 m)   Wt 185 lb (83.9 kg)   LMP 09/18/2011   BMI 31.76 kg/m   Specialty Comments:  No specialty comments available.  PMFS History: Patient Active Problem List   Diagnosis Date Noted  . Dehiscence of amputation stump ([place])   . S/P transmetatarsal amputation of foot, right ([place]) 04/24/2018  . Wound dehiscence, surgical   . Cardiac arrest ([place]) 03/19/2018  . PAF (paroxysmal atrial fibrillation) ([place])   . Respiratory distress   . Pulmonary infiltrates   . Ventilator associated pneumonia ([place])   . Oxygen dependent   . Hypotension   . Respiratory failure ([place])   . Type 2 diabetes mellitus with diabetic neuropathy ([place])   . Abscess of toe of right foot 02/26/2018  . Diabetes mellitus type 2, uncontrolled ([place]) 02/26/2018  . Toe infection   . Diabetic  polyneuropathy associated with type 2 diabetes mellitus ([place])   . Osteomyelitis of second toe of right foot ([place])   . Severe protein-calorie malnutrition ([place])   . Open toe fracture 02/25/2018  . Seasonal allergies 04/12/2017  . Post-menopausal bleeding 12/07/2015  . ESRD on dialysis ([place]) 05/25/2015  . Hypoalbuminemia   . Abnormal appearance of cervix 08/08/2011  . Back pain 08/07/2011  . GERD (gastroesophageal reflux disease) 04/27/2010  . Hyperlipidemia 03/08/2006  . Morbid obesity ([place]) 03/08/2006  . Essential hypertension with morbid obesity 03/08/2006   Past Medical History:  Diagnosis Date  . Cardiac arrest ([place]) 02/28/2018   while on hemodialysis in hospital  . CHF (congestive heart failure) ([place])   . Chronic kidney disease due to diabetes mellitus ([place]) 10/13/2013  . Diabetes mellitus    Type II  . ESRD (end stage renal disease) ([place])    MWF - [facility]  . Hyperlipidemia   . Hypertension   . Neuropathy     Family History  Problem Relation Age of Onset  . Heart disease Mother   . Heart disease Father     Past Surgical History:  Procedure Laterality Date  . AMPUTATION Right 02/27/2018   Procedure: RIGHT FOOT 2ND AND 3RD RAY AMPUTATION;  Surgeon: [name], MD;  Location: [place];  Service: Orthopedics;  Laterality: Right;  . AMPUTATION Right 04/24/2018   Procedure: RIGHT TRANSMETATARSAL AMPUTATION, PLACE VAC;  Surgeon: [name], MD;  Location: [facility];  Service: Orthopedics;  Laterality: Right;  . AV FISTULA PLACEMENT Left 04/15/2015   Procedure: ARTERIOVENOUS (AV) FISTULA CREATION;  Surgeon: Mal Misty, MD;  Location: [facility];  Service: Vascular;  Laterality: Left;  . EYE SURGERY Right    retinal detachment  . FISTULA SUPERFICIALIZATION Left 06/03/2015   Procedure: LEFT UPPER ARM BRACHIOCEPHALIC FISTULA SUPERFICIALIZATION;  Surgeon: Mal Misty, MD;  Location: [facility];  Service: Vascular;  Laterality: Left;  from [place] to General  . STUMP REVISION  Right 05/24/2018   Procedure: REVISION RIGHT TRANSMETATARSAL AMPUTATION WITH EXCISION BONE/SOFT TISSUE, LOCAL TISSUE  RE-ARRANGEMENT  AND VAC PLACEMENT;  Surgeon: [name], MD;  Location: [facility];  Service: Orthopedics;  Laterality: Right;   Social History   Occupational History  . Not on file  Tobacco Use  . Smoking status: Never Smoker  . Smokeless tobacco: Never Used  Substance and Sexual Activity  . Alcohol use: No    Alcohol/week: 0.0 standard drinks  . Drug use: No  . Sexual activity: Never

## 2019-02-22 NOTE — Assessment & Plan Note (Signed)
Last lipid panel was in 2019.  Reports medication compliance.  Will put in order for future lipid panel.

## 2019-02-22 NOTE — Assessment & Plan Note (Signed)
Reported improvement with cessation of narcotic and muscle relaxers.

## 2019-02-22 NOTE — Assessment & Plan Note (Signed)
Takes home bp and reports good control.  Only on carvedilol currently.   - continue home medication - continue to monitor at home

## 2019-02-22 NOTE — Assessment & Plan Note (Signed)
Last a1c was >11 approximately one year ago, but this was during a hospitalizatoin for infection so may not represent true value.  Informed pt the necessity of making sure diabetes is well controlled.  Currently taking 50 U bid of levemir with reportedly good control - future lab for a1c

## 2019-03-11 ENCOUNTER — Telehealth: Payer: Self-pay | Admitting: Family Medicine

## 2019-03-11 ENCOUNTER — Other Ambulatory Visit: Payer: Self-pay

## 2019-03-11 ENCOUNTER — Other Ambulatory Visit (INDEPENDENT_AMBULATORY_CARE_PROVIDER_SITE_OTHER): Payer: Medicare Other

## 2019-03-11 DIAGNOSIS — E785 Hyperlipidemia, unspecified: Secondary | ICD-10-CM

## 2019-03-11 DIAGNOSIS — R5383 Other fatigue: Secondary | ICD-10-CM

## 2019-03-11 DIAGNOSIS — E11649 Type 2 diabetes mellitus with hypoglycemia without coma: Secondary | ICD-10-CM | POA: Diagnosis present

## 2019-03-11 LAB — POCT GLYCOSYLATED HEMOGLOBIN (HGB A1C): HbA1c, POC (controlled diabetic range): 7.7 % — AB (ref 0.0–7.0)

## 2019-03-11 NOTE — Telephone Encounter (Signed)
Clinical info completed on Diabetic Footwear form.  Place form in Dr. Richardson Landry box for completion.  Rachel Chaney, CMA

## 2019-03-11 NOTE — Telephone Encounter (Signed)
Pt.presented form for Diabetic Therapeutic Shoe Program Prescription to be completed. Last appt 03/11/2019  Form placed in white team folder

## 2019-03-12 LAB — CBC
Hematocrit: 35 % (ref 34.0–46.6)
Hemoglobin: 11.4 g/dL (ref 11.1–15.9)
MCH: 31.9 pg (ref 26.6–33.0)
MCHC: 32.6 g/dL (ref 31.5–35.7)
MCV: 98 fL — ABNORMAL HIGH (ref 79–97)
Platelets: 209 10*3/uL (ref 150–450)
RBC: 3.57 x10E6/uL — ABNORMAL LOW (ref 3.77–5.28)
RDW: 16.2 % — ABNORMAL HIGH (ref 11.7–15.4)
WBC: 15.3 10*3/uL — ABNORMAL HIGH (ref 3.4–10.8)

## 2019-03-12 LAB — CHOLESTEROL, TOTAL: Cholesterol, Total: 79 mg/dL — ABNORMAL LOW (ref 100–199)

## 2019-03-17 NOTE — Telephone Encounter (Signed)
Informed pt of below.Rachel Chaney, CMA  

## 2019-03-17 NOTE — Telephone Encounter (Signed)
Form is available for pickup at front desk. Please let patient know.

## 2019-04-15 ENCOUNTER — Telehealth: Payer: Self-pay | Admitting: Family Medicine

## 2019-04-15 NOTE — Telephone Encounter (Signed)
This pt needs to be scheduled for an in-person visit with me or someone else in the next 7-10 days so we can do a thorough foot exam, which she needs in order for her insurance to pay for her diabetic shoes.

## 2019-04-17 NOTE — Telephone Encounter (Addendum)
Informed pt of below and she said she thought she had a foot exam with the last doctor she had, I told her the last one we show was from 08/23/2017, I told her that a updated foot exam needed to be done for her insurance to pay for the shoes.  She said she would call to schedule a check up.  She did say that she had already picked up the paper work and that the shoes had been ordered already. Elsworth Ledin Zimmerman Rumple, CMA

## 2019-04-21 ENCOUNTER — Ambulatory Visit (INDEPENDENT_AMBULATORY_CARE_PROVIDER_SITE_OTHER): Payer: Medicare Other

## 2019-04-21 ENCOUNTER — Other Ambulatory Visit: Payer: Self-pay | Admitting: Podiatry

## 2019-04-21 ENCOUNTER — Ambulatory Visit (INDEPENDENT_AMBULATORY_CARE_PROVIDER_SITE_OTHER): Payer: Medicare Other | Admitting: Podiatry

## 2019-04-21 ENCOUNTER — Other Ambulatory Visit: Payer: Self-pay

## 2019-04-21 DIAGNOSIS — M2042 Other hammer toe(s) (acquired), left foot: Secondary | ICD-10-CM

## 2019-04-21 DIAGNOSIS — L97522 Non-pressure chronic ulcer of other part of left foot with fat layer exposed: Secondary | ICD-10-CM | POA: Diagnosis not present

## 2019-04-21 DIAGNOSIS — I739 Peripheral vascular disease, unspecified: Secondary | ICD-10-CM | POA: Diagnosis not present

## 2019-04-21 DIAGNOSIS — E1149 Type 2 diabetes mellitus with other diabetic neurological complication: Secondary | ICD-10-CM | POA: Diagnosis not present

## 2019-04-21 DIAGNOSIS — M79672 Pain in left foot: Secondary | ICD-10-CM

## 2019-04-21 MED ORDER — DOXYCYCLINE HYCLATE 100 MG PO TABS: 100.0000 mg | ORAL_TABLET | Freq: Two times a day (BID) | ORAL | 0 refills | Status: DC

## 2019-04-22 ENCOUNTER — Telehealth: Payer: Self-pay | Admitting: *Deleted

## 2019-04-22 DIAGNOSIS — I480 Paroxysmal atrial fibrillation: Secondary | ICD-10-CM

## 2019-04-22 DIAGNOSIS — R0989 Other specified symptoms and signs involving the circulatory and respiratory systems: Secondary | ICD-10-CM

## 2019-04-22 DIAGNOSIS — I10 Essential (primary) hypertension: Secondary | ICD-10-CM

## 2019-04-22 NOTE — Telephone Encounter (Signed)
prepared required form, referral, orders, and demographics to be faxed to [facility] once 04/21/2019 clinicals are available.

## 2019-04-22 NOTE — Telephone Encounter (Signed)
-----   Message from Trula Slade, DPM sent at 04/21/2019  3:46 PM EDT ----- Can you please order arterial studies and put in for a referral to [facility]? Thanks.

## 2019-04-24 NOTE — Progress Notes (Signed)
Subjective:   Patient ID: Early Chars, female   DOB: 59 y.o.   MRN: [id number]   HPI 59 year old female presents the office for the concerns of a wound in between her fourth and fifth toes of the left foot.  This has been on for the last 3 weeks.  She has had some redness and swelling to the toe.  She denies any red streaks.  She denies any fevers, chills, nausea, vomiting.  Pain, chest pain, shortness of breath.  She is previous undergone right transmetatarsal amputation with Dr. Sharol Given   Review of Systems  All other systems reviewed and are negative.  Past Medical History:  Diagnosis Date  . Cardiac arrest ([place]) 02/28/2018   while on hemodialysis in hospital  . CHF (congestive heart failure) ([place])   . Chronic kidney disease due to diabetes mellitus ([place]) 10/13/2013  . Diabetes mellitus    Type II  . ESRD (end stage renal disease) ([place])    MWF - [facility]  . Hyperlipidemia   . Hypertension   . Neuropathy     Past Surgical History:  Procedure Laterality Date  . AMPUTATION Right 02/27/2018   Procedure: RIGHT FOOT 2ND AND 3RD RAY AMPUTATION;  Surgeon: [name], MD;  Location: [place];  Service: Orthopedics;  Laterality: Right;  . AMPUTATION Right 04/24/2018   Procedure: RIGHT TRANSMETATARSAL AMPUTATION, PLACE VAC;  Surgeon: [name], MD;  Location: [place];  Service: Orthopedics;  Laterality: Right;  . AV FISTULA PLACEMENT Left 04/15/2015   Procedure: ARTERIOVENOUS (AV) FISTULA CREATION;  Surgeon: Mal Misty, MD;  Location: [place];  Service: Vascular;  Laterality: Left;  . EYE SURGERY Right    retinal detachment  . FISTULA SUPERFICIALIZATION Left 06/03/2015   Procedure: LEFT UPPER ARM BRACHIOCEPHALIC FISTULA SUPERFICIALIZATION;  Surgeon: Mal Misty, MD;  Location: [place];  Service: Vascular;  Laterality: Left;  from [place] to General  . STUMP REVISION Right 05/24/2018   Procedure: REVISION RIGHT TRANSMETATARSAL AMPUTATION WITH EXCISION BONE/SOFT TISSUE, LOCAL  TISSUE  RE-ARRANGEMENT  AND VAC PLACEMENT;  Surgeon: [name], MD;  Location: [place];  Service: Orthopedics;  Laterality: Right;     Current Outpatient Medications:  .  acetaminophen (TYLENOL) 500 MG tablet, Take 1,000 mg by mouth every 6 (six) hours as needed for pain., Disp: , Rfl:  .  aspirin EC 81 MG tablet, Take 1 tablet (81 mg total) by mouth daily., Disp: 90 tablet, Rfl: 3 .  atorvastatin (LIPITOR) 80 MG tablet, Take 1 tablet (80 mg total) by mouth at bedtime., Disp: 90 tablet, Rfl: 3 .  Blood Glucose Monitoring Suppl (ONE TOUCH ULTRA 2) w/Device KIT, 1 kit by Does not apply route 3 (three) times daily. ICD-10 code: E11.40, Disp: 1 each, Rfl: 0 .  carvedilol (COREG) 25 MG tablet, Take 1 tablet (25 mg total) by mouth 2 (two) times daily., Disp: 180 tablet, Rfl: 0 .  cyclobenzaprine (FLEXERIL) 10 MG tablet, Take 1 tablet (10 mg total) by mouth 3 times/day as needed-between meals & bedtime for muscle spasms., Disp: 30 tablet, Rfl: 0 .  doxycycline (VIBRA-TABS) 100 MG tablet, Take 1 tablet (100 mg total) by mouth 2 (two) times daily., Disp: 20 tablet, Rfl: 0 .  doxycycline (VIBRAMYCIN) 100 MG capsule, Take 1 capsule (100 mg total) by mouth 2 (two) times daily., Disp: 28 capsule, Rfl: 1 .  ferric citrate (AURYXIA) 1 GM 210 MG(Fe) tablet, Take 2 tablets (420 mg total) by mouth 3 (  three) times daily with meals. Give two (420 mg) by mouth three times a day wih meals for ESRD (Patient taking differently: Take 210-420 mg by mouth See admin instructions. Take 420 mg with each meal and 210 mg with snacks), Disp: 180 tablet, Rfl: 0 .  fluticasone (FLONASE) 50 MCG/ACT nasal spray, Place 1 spray into both nostrils daily. 1 spray in each nostril every day (Patient taking differently: Place 1 spray into both nostrils daily as needed for allergies. ), Disp: 1 g, Rfl: 0 .  glucose blood (ONE TOUCH ULTRA TEST) test strip, 1 each by Other route 3 (three) times daily. ICD-10 code: E11.40., Disp: 100 each, Rfl:  0 .  hydrALAZINE (APRESOLINE) 25 MG tablet, Take 25 mg by mouth 2 (two) times a day., Disp: , Rfl:  .  HYDROcodone-acetaminophen (NORCO) 5-325 MG tablet, Take 1 tablet by mouth every 6 (six) hours as needed for moderate pain., Disp: 30 tablet, Rfl: 0 .  Insulin Detemir (LEVEMIR) 100 UNIT/ML Pen, 50U in the am and 40U in pm (Patient taking differently: Inject 40-50 Units into the skin See admin instructions. Inject 50U in the am and 40U in pm), Disp: 15 mL, Rfl: 11 .  Insulin Pen Needle (B-D UF III MINI PEN NEEDLES) 31G X 5 MM MISC, USE AS DIRECTED FOR INSULIN INJECTION TWICE DAILY, Disp: 200 each, Rfl: 0 .  Insulin Pen Needle 29G X 12MM MISC, Inject 1 pen into the skin 2 (two) times daily. Check blood sugar daily., Disp: 100 each, Rfl: 0 .  Insulin Syringe-Needle U-100 (ULTICARE INSULIN SYRINGE) 30G X 5/16" 1 ML MISC, USE AS DIRECTED, Disp: 100 each, Rfl: 0 .  multivitamin (RENA-VIT) TABS tablet, Take 1 tablet by mouth daily., Disp: , Rfl:  .  nitroGLYCERIN (NITRODUR - DOSED IN MG/24 HR) 0.2 mg/hr patch, Place 1 patch (0.2 mg total) onto the skin daily., Disp: 30 patch, Rfl: 12 .  OneTouch Delica Lancets 46F MISC, 1 Device by Does not apply route as needed (to check blood glucose)., Disp: 100 each, Rfl: 0 .  pentoxifylline (TRENTAL) 400 MG CR tablet, Take 1 tablet (400 mg total) by mouth 3 (three) times daily with meals., Disp: 90 tablet, Rfl: 1  No Known Allergies       Objective:  Physical Exam  General: AAO x3, NAD  Dermatological: Ulceration present along the fourth interspace of the left foot with granular, fibrotic tissue present along the medial aspect of the fifth digit at the sulcus as well as the fourth digit at the sulcus lateral aspect.  There is no probing to bone or exposed tendon.  Mild edema and faint erythema.  There is no ascending cellulitis.  There is no fluctuation crepitation.  There is no malodor.  Vascular: Pulses decreased left side  Neruologic: Sensation decreased  with Semmes Weinstein monofilament  Musculoskeletal: Digital contractures present muscular strength 5/5 in all groups tested bilateral.  Gait: Unassisted, Nonantalgic.       Assessment:   Ulceration left foot with decreased pulses      Plan:  -Treatment options discussed including all alternatives, risks, and complications -Etiology of symptoms were discussed -X-rays were obtained and reviewed with the patient.  There is a questionable area of radiolucency on the fifth digit concern for possible osteomyelitis..  No evidence of acute fracture. -Prescribed doxycycline.  Discussed daily dressing changes with Betadine interdigitally.  Surgical shoe dispensed.  Arterial studies were ordered.  Discussed possibility of amputation of the toe but will continue monitor clinically.  I will see her back next week or sooner if needed.  Monitoring signs or symptoms of worsening infection to the ER if any occur.  Trula Slade DPM

## 2019-04-25 NOTE — Telephone Encounter (Signed)
Faxed required form, clinical 04/10/2019 and demographics to [facility].

## 2019-04-25 NOTE — Telephone Encounter (Signed)
done

## 2019-04-26 ENCOUNTER — Other Ambulatory Visit: Payer: Self-pay | Admitting: Internal Medicine

## 2019-04-26 DIAGNOSIS — J302 Other seasonal allergic rhinitis: Secondary | ICD-10-CM

## 2019-04-27 ENCOUNTER — Other Ambulatory Visit: Payer: Self-pay | Admitting: Internal Medicine

## 2019-04-28 ENCOUNTER — Ambulatory Visit: Payer: Medicare Other | Admitting: Podiatry

## 2019-04-29 ENCOUNTER — Other Ambulatory Visit: Payer: Self-pay | Admitting: Family Medicine

## 2019-04-29 ENCOUNTER — Other Ambulatory Visit: Payer: Self-pay | Admitting: Internal Medicine

## 2019-05-08 ENCOUNTER — Other Ambulatory Visit: Payer: Self-pay

## 2019-05-08 ENCOUNTER — Encounter: Payer: Self-pay | Admitting: Family Medicine

## 2019-05-08 ENCOUNTER — Other Ambulatory Visit: Payer: Self-pay | Admitting: Family Medicine

## 2019-05-08 ENCOUNTER — Ambulatory Visit (INDEPENDENT_AMBULATORY_CARE_PROVIDER_SITE_OTHER): Payer: Medicare Other | Admitting: Family Medicine

## 2019-05-08 VITALS — BP 158/64 | HR 80 | Ht 64.0 in | Wt 223.6 lb

## 2019-05-08 DIAGNOSIS — Z794 Long term (current) use of insulin: Secondary | ICD-10-CM

## 2019-05-08 DIAGNOSIS — Z1231 Encounter for screening mammogram for malignant neoplasm of breast: Secondary | ICD-10-CM | POA: Diagnosis present

## 2019-05-08 DIAGNOSIS — E1140 Type 2 diabetes mellitus with diabetic neuropathy, unspecified: Secondary | ICD-10-CM | POA: Diagnosis not present

## 2019-05-08 DIAGNOSIS — Z1211 Encounter for screening for malignant neoplasm of colon: Secondary | ICD-10-CM

## 2019-05-08 NOTE — Progress Notes (Signed)
    SUBJECTIVE:   CHIEF COMPLAINT / HPI:   Diabetes checkup Patient reports she is here for diabetes checkup because she is going to the [facility] and I need a diabetic foot exam before they can fit her for her orthotics.  Patient reports her diabetes has been well controlled and denies any low blood sugars with her lowest being in the 120s.  Patient also reports that her blood sugars have been extremely elevated either.  Patient is pleased with her diabetic regimen at this time.  Health maintenance Patient reports that she had a mammogram 3 years ago she is in need of one now.  Patient also reports that she needs a colonoscopy.  Patient is also due for cardiology appointment given her history of hypertension.  Her nephrologist recently adjusted her blood pressure medications stopped her hydralazine.  Recommended seeing her cardiologist for recommendations regarding hypertensive medications.  Also recommended patient schedule an appointment for Pap smear given she is overdue for that as well.   PERTINENT  PMH / PSH: ESRD, diabetes  OBJECTIVE:   BP (!) 158/64   Pulse 80   Ht 5\' 4"  (1.626 m)   Wt 223 lb 9.6 oz (101.4 kg)   LMP 09/18/2011   SpO2 95%   BMI 38.38 kg/m   General: Well-appearing, no acute distress, sitting comfortably in chair Respiratory: Normal work of breathing, clear to auscultation bilaterally Cardio: Regular rate and rhythm, no murmurs appreciated Abdomen: Soft, nontender, positive bowel sounds MSK: Patient has healing wounds on left lower extremity which is being treated by podiatry.  Patient has transmetatarsal amputation of the right foot.  Patient is able to ambulate with a cane but has difficulty given the transmetatarsal amputation of the right foot. Diabetic foot exam was performed with the following findings:   Normal sensation of 10g monofilament Patient has healing ulcers near the base of the first digit and fourth and fifth digits on the left foot.   Patient also has transmetatarsal amputation of the right foot.  Left foot posterior tibialis and dorsalis pulse intact.  Right foot was able to palpate posterior tibialis pulse but difficulty finding dorsalis pulse due to transmetatarsal amputation.     ASSESSMENT/PLAN:   Type 2 diabetes mellitus with diabetic neuropathy ([place]) Most recent A1c was 7.7.  It was not done today because this was from March 2021.  Currently taking 50 units twice daily of Levemir and reports good control. -Continue Levemir at current dose -Diabetic foot exam completed today    Gifford Shave, MD [place]

## 2019-05-08 NOTE — Patient Instructions (Addendum)
It was a pleasure to meet you today.  I have completed your diabetic foot exam that you needed for the [facility].  I would like for you to continue your diabetes regimen at this time.  We are also going to address some routine health maintenance exams.  I have placed orders for referrals to gastroenterology for colonoscopy to be scheduled as well as an order for a mammogram from [facility] breast to assess for breast cancer.  I would also like you to schedule an appointment for a Pap smear before you leave.  You should probably see your cardiologist in the near future given it has been over a year since you have seen them just that they can weigh in on your blood pressure management.  If you have any questions or concerns please feel free to call the clinic and we can help you out with that.  I hope you have a wonderful day!

## 2019-05-08 NOTE — Assessment & Plan Note (Signed)
Most recent A1c was 7.7.  It was not done today because this was from March 2021.  Currently taking 50 units twice daily of Levemir and reports good control. -Continue Levemir at current dose -Diabetic foot exam completed today

## 2019-05-16 ENCOUNTER — Telehealth: Payer: Self-pay | Admitting: *Deleted

## 2019-05-16 NOTE — Telephone Encounter (Signed)
[facility] - [facility] B[facility] states pt does not want to schedule with them at this time.

## 2019-05-20 NOTE — Telephone Encounter (Signed)
Thanks for the update

## 2019-06-16 ENCOUNTER — Other Ambulatory Visit: Payer: Self-pay | Admitting: Family Medicine

## 2019-06-16 ENCOUNTER — Other Ambulatory Visit: Payer: Self-pay

## 2019-06-16 ENCOUNTER — Other Ambulatory Visit: Payer: Self-pay | Admitting: Internal Medicine

## 2019-06-16 DIAGNOSIS — J302 Other seasonal allergic rhinitis: Secondary | ICD-10-CM

## 2019-06-16 MED ORDER — CYCLOBENZAPRINE HCL 10 MG PO TABS: 10.0000 mg | ORAL_TABLET | Freq: Two times a day (BID) | ORAL | 0 refills | Status: DC | PRN

## 2019-07-22 ENCOUNTER — Ambulatory Visit (INDEPENDENT_AMBULATORY_CARE_PROVIDER_SITE_OTHER): Payer: Medicare Other | Admitting: Podiatry

## 2019-07-22 ENCOUNTER — Other Ambulatory Visit: Payer: Self-pay

## 2019-07-22 ENCOUNTER — Encounter: Payer: Self-pay | Admitting: Podiatry

## 2019-07-22 ENCOUNTER — Ambulatory Visit (INDEPENDENT_AMBULATORY_CARE_PROVIDER_SITE_OTHER): Payer: Medicare Other

## 2019-07-22 VITALS — Temp 98.7°F

## 2019-07-22 DIAGNOSIS — E1151 Type 2 diabetes mellitus with diabetic peripheral angiopathy without gangrene: Secondary | ICD-10-CM

## 2019-07-22 DIAGNOSIS — Z89431 Acquired absence of right foot: Secondary | ICD-10-CM

## 2019-07-22 DIAGNOSIS — M869 Osteomyelitis, unspecified: Secondary | ICD-10-CM

## 2019-07-22 DIAGNOSIS — L97522 Non-pressure chronic ulcer of other part of left foot with fat layer exposed: Secondary | ICD-10-CM | POA: Diagnosis not present

## 2019-07-22 DIAGNOSIS — B351 Tinea unguium: Secondary | ICD-10-CM | POA: Diagnosis not present

## 2019-07-22 DIAGNOSIS — N186 End stage renal disease: Secondary | ICD-10-CM | POA: Diagnosis not present

## 2019-07-22 DIAGNOSIS — Z992 Dependence on renal dialysis: Secondary | ICD-10-CM

## 2019-07-22 MED ORDER — DOXYCYCLINE HYCLATE 100 MG PO TABS: 100.0000 mg | ORAL_TABLET | Freq: Two times a day (BID) | ORAL | 0 refills | Status: AC

## 2019-07-22 MED ORDER — CADEXOMER IODINE 0.9 % EX GEL: CUTANEOUS | 0 refills | Status: DC

## 2019-07-22 NOTE — Progress Notes (Addendum)
Subjective: Rachel Chaney presents today at risk foot care. Patient has h/o amputation of transmetatarsal amputation right foot (by Dr. Sharol Given) and painful thick toenails that are difficult to trim. Pain interferes with ambulation. Aggravating factors include wearing enclosed shoe gear. Pain is relieved with periodic professional debridement.   Pt also has h/o ulceration left 5th digit. States she has been applying betadine solution to digit. She does not relate pain, but states that it itches a lot. Denies any fever, chills, night sweats, nausea or vomiting.   She has seen Dr. Jacqualyn Posey for ulceration and possible osteomyelitis. At that time, arterial studies were ordered and was to follow up on 04/29/2019. She did not make her follow up appointment.  She is a dialysis patient and her dialysis days are MWF.  Benay Pike, MD is patient's PCP. Last visit was: 02/18/2019 via telemedicine.  Past Medical History:  Diagnosis Date   Cardiac arrest ([place]) 02/28/2018   while on hemodialysis in hospital   CHF (congestive heart failure) ([place])    Chronic kidney disease due to diabetes mellitus ([place]) 10/13/2013   Diabetes mellitus    Type II   ESRD (end stage renal disease) ([place])    MWF - [facility]   Hyperlipidemia    Hypertension    Neuropathy      Patient Active Problem List   Diagnosis Date Noted   S/P transmetatarsal amputation of foot, right ([place]) 04/24/2018   PAF (paroxysmal atrial fibrillation) ([place])    Oxygen dependent    Type 2 diabetes mellitus with diabetic neuropathy ([place])    Severe protein-calorie malnutrition ([place])    Seasonal allergies 04/12/2017   Post-menopausal bleeding 12/07/2015   ESRD on dialysis ([place]) 05/25/2015   Hypoalbuminemia    Abnormal appearance of cervix 08/08/2011   Back pain 08/07/2011   GERD (gastroesophageal reflux disease) 04/27/2010   Hyperlipidemia 03/08/2006   Morbid obesity ([place]) 03/08/2006   Essential hypertension with morbid obesity  03/08/2006    Current Outpatient Medications on File Prior to Visit  Medication Sig Dispense Refill   cetirizine (ZYRTEC) 10 MG tablet Take by mouth.     Etelcalcetide HCl (PARSABIV IV) Etelcalcetide (Parsabiv)     hydrOXYzine (ATARAX/VISTARIL) 50 MG tablet Take by mouth.     Methoxy PEG-Epoetin Beta (MIRCERA IJ) Mircera     acetaminophen (TYLENOL) 500 MG tablet Take 1,000 mg by mouth every 6 (six) hours as needed for pain.     aspirin EC 81 MG tablet Take 1 tablet (81 mg total) by mouth daily. 90 tablet 3   atorvastatin (LIPITOR) 80 MG tablet Take 1 tablet (80 mg total) by mouth at bedtime. 90 tablet 3   Blood Glucose Monitoring Suppl (ONE TOUCH ULTRA 2) w/Device KIT 1 kit by Does not apply route 3 (three) times daily. ICD-10 code: E11.40 1 each 0   carvedilol (COREG) 25 MG tablet TAKE 1 TABLET(25 MG) BY MOUTH TWICE DAILY 180 tablet 1   cyclobenzaprine (FLEXERIL) 10 MG tablet Take 1 tablet (10 mg total) by mouth 3 times/day as needed-between meals & bedtime for muscle spasms. 30 tablet 0   ferric citrate (AURYXIA) 1 GM 210 MG(Fe) tablet Take 2 tablets (420 mg total) by mouth 3 (three) times daily with meals. Give two (420 mg) by mouth three times a day wih meals for ESRD (Patient taking differently: Take 210-420 mg by mouth See admin instructions. Take 420 mg with each meal and 210 mg with snacks) 180 tablet 0   fluticasone (  FLONASE) 50 MCG/ACT nasal spray Place 1 spray into both nostrils daily. 16 g 5   glucose blood (ONE TOUCH ULTRA TEST) test strip 1 each by Other route 3 (three) times daily. ICD-10 code: E11.40. 100 each 0   hydrALAZINE (APRESOLINE) 25 MG tablet Take 25 mg by mouth 2 (two) times a day.     HYDROcodone-acetaminophen (NORCO) 5-325 MG tablet Take 1 tablet by mouth every 6 (six) hours as needed for moderate pain. 30 tablet 0   Insulin Detemir (LEVEMIR) 100 UNIT/ML Pen 50U in the am and 40U in pm (Patient taking differently: Inject 40-50 Units into the skin See admin  instructions. Inject 50U in the am and 40U in pm) 15 mL 11   Insulin Pen Needle (B-D UF III MINI PEN NEEDLES) 31G X 5 MM MISC USE AS DIRECTED TWICE DAILY 200 each 0   Insulin Pen Needle 29G X 12MM MISC Inject 1 pen into the skin 2 (two) times daily. Check blood sugar daily. 100 each 0   Insulin Syringe-Needle U-100 (ULTICARE INSULIN SYRINGE) 30G X 5/16" 1 ML MISC USE AS DIRECTED 100 each 0   multivitamin (RENA-VIT) TABS tablet Take 1 tablet by mouth daily.     nitroGLYCERIN (NITRODUR - DOSED IN MG/24 HR) 0.2 mg/hr patch Place 1 patch (0.2 mg total) onto the skin daily. 30 patch 12   OneTouch Delica Lancets 18H MISC 1 Device by Does not apply route as needed (to check blood glucose). 100 each 0   pentoxifylline (TRENTAL) 400 MG CR tablet Take 1 tablet (400 mg total) by mouth 3 (three) times daily with meals. 90 tablet 1   [DISCONTINUED] progesterone (PROMETRIUM) 200 MG capsule Take 1 capsule (200 mg total) by mouth daily. Take for 10 days if bleeding occurs for more than 4 days. 10 capsule 1   No current facility-administered medications on file prior to visit.     No Known Allergies  Objective: Rachel Chaney is a pleasant 59 y.o. African American female obese in NAD. AAO x 3.  Vitals:   07/22/19 1213  Temp: 98.7 F (37.1 C)    Vascular Examination: Capillary fill time to digits delayed left lower extremity. DP pulse decreased left foot. DP pulse decreased right foot. PT pulse decreased left foot. PT pulse decreased right foot. Pedal hair present. Lower extremity skin temperature gradient within normal limits.  Dermatological Examination: Pedal skin is thin shiny, atrophic b/l lower extremities. Toenails 1-5 left elongated, discolored, dystrophic, thickened, and crumbly with subungual debris and tenderness to dorsal palpation.   No images are attached to the encounter.  Wound Location: L 5th toe There is a moderate amount of devitalized tissue present in the wound. Predebridement  Wound Measurement:  1.0  x 1.0 x 1.5 cm from medial to lateral aspect of left 5th digit. This is a through and through wound. Postdebridement Wound Measurement: 1.0 x 1.0 x 1.5 cm. Wound Base: Fibrotic slough Peri-wound: Normal Exudate: Scant/small amount Purulent exudate Blood Loss during debridement: 0 cc('s). Material in wound which inhibits healing/promotes adjacent tissue breakdown:  nonviable hyperkeratosis. Description of tissue removed from ulceration today:  nonviable hyperkeratosis. Sign(s) of clinical bacterial infection: edema and purulent drainage  Musculoskeletal: Normal muscle strength 5/5 to all lower extremity muscle groups bilaterally. No pain crepitus or joint limitation noted with ROM b/l. Hammertoes noted to the left foot. Utilizes cane for ambulation assistance.  Neurological Examination: Protective sensation decreased with 10 gram monofilament b/l. Clonus negative b/l.  Last A1c: Hemoglobin  A1C Latest Ref Rng & Units 03/11/2019  HGBA1C 0.0 - 7.0 % 7.7(A)  Some recent data might be hidden   Xray left foot 3 views: No gas in tissues. +Vessel calcification There is absence of middle phalanx and erosion of proximal phlangeal head and shaft.   Assessment: 1. Onychomycosis   2. Osteomyelitis of fifth toe of left foot ([place])   3. S/P transmetatarsal amputation of foot, right ([place])   4. Type II diabetes mellitus with peripheral circulatory disorder (HCC)   5. ESRD on dialysis [facility])    Plan: -Patient was evaluated and treated and all questions answered.  -Patient/POA/Family member educated on diagnosis and treatment plan osteomyelitis of left 5th digit with Dr. Jacqualyn Posey. Discussed treatment options with Dr.Wagoner which included amputation of toe. Patient is not in agreement with amputation at this time. She was educated on signs/symptoms of worsening infection such as worsening of digit, increased swelling of foot, warmth of foot, odor, fever, chills, nightsweats, nausea  or vomiting. Patient instructed to report to ED with any of these symptoms. -Xray left foot taken and reviewed with patient in office today. -All purulent drainage expressed/irrigated Ulceration was cleansed with wound cleanser.  -Type/amount of devitalized tissue removed: nonviable hyperkeratosis -Today's ulcer size post-debridement: 1.0 x 1.0 x 1.5 cm. -Ulceration cleansed with wound cleanser. Iodosorb Gel applied to base of ulceration and secured with light dressing. -Wound responded well to today's treatment. Rachel Chaney given written instructions on daily wound care for L 5th toe ulceration. -Rx for Doxycyline 100 mg, #28, to be taken twice daily for 14 days. -Rx for Iodosorb Gel written to be applied to left 5th digit ulcer once daily. -Patient risk factors affecting healing of ulcer: uncontrolled diabetes, diabetic neuropathy, PAD, history of prior amputation, foot deformity, patient compliance, ESRD on hemodialysis, dyslipidemia -Toenails 1-5 left foot debrided in length and girth without iatrogenic bleeding with sterile nail nipper and dremel.  -Patient to report any pedal injuries to medical professional immediately. -Patient to continue soft, supportive shoe gear daily. -Patient/POA to call should there be question/concern in the interim.  Return in about 1 week (around 07/29/2019) for Dr. Jacqualyn Posey.  Marzetta Board, DPM    I personally evaluated the patient and reviewed x-ray findings with her. Due to the infection I have recommended amputation of the toe. She declined this and states "I want more antibiotics". I discussed with her pros and cons of trying to save the toe versus amputation. I have ultimately recommended amputation of the toe but she declines. Will start PO antibiotics. Also discussed getting the vascular testing that was ordered at her last appointment with me. Discussed if there is any worsening or changes to report to the ER.   Celesta Gentile, DPM

## 2019-07-22 NOTE — Patient Instructions (Signed)
DRESSING CHANGES L 5th toe :   A. IF DISPENSED, WEAR SURGICAL SHOE/BOOT AT ALL TIMES.  B. IF PRESCRIBED ORAL ANTIBIOTICS, TAKE ALL MEDICATION AS PRESCRIBED UNTIL ALL ARE GONE.  C. IF DOCTOR HAS DESIGNATED NONWEIGHTBEARING STATUS, PLEASE ADHERE TO INSTRUCTIONS  1. KEEP L 5th toe  FOOT DRY AT ALL TIMES!!!!  2. CLEANSE ULCER WITH SALINE.  3. DAB DRY WITH GAUZE SPONGE.  4. APPLY A LIGHT AMOUNT OF Iodosorb Gel TO BASE OF ULCER.  5. APPLY OUTER DRESSING AS INSTRUCTED.  6. WEAR SURGICAL SHOE/BOOT DAILY AT ALL TIMES. IF SUPPLIED, WEAR HEEL PROTECTORS AT ALL TIMES WHEN IN BED.  7. DO NOT WALK BAREFOOT!!!  8.  IF YOU EXPERIENCE ANY FEVER, CHILLS, NIGHTSWEATS, NAUSEA OR VOMITING, ELEVATED OR LOW BLOOD SUGARS, REPORT TO EMERGENCY ROOM.  9. IF YOU EXPERIENCE INCREASED REDNESS, PAIN, SWELLING, DISCOLORATION, ODOR, PUS, DRAINAGE OR WARMTH OF YOUR FOOT, REPORT TO EMERGENCY ROOM.

## 2019-08-07 ENCOUNTER — Ambulatory Visit (INDEPENDENT_AMBULATORY_CARE_PROVIDER_SITE_OTHER): Payer: Medicare Other

## 2019-08-07 ENCOUNTER — Other Ambulatory Visit: Payer: Self-pay

## 2019-08-07 ENCOUNTER — Ambulatory Visit (INDEPENDENT_AMBULATORY_CARE_PROVIDER_SITE_OTHER): Payer: Medicare Other | Admitting: Podiatry

## 2019-08-07 DIAGNOSIS — E1151 Type 2 diabetes mellitus with diabetic peripheral angiopathy without gangrene: Secondary | ICD-10-CM | POA: Diagnosis not present

## 2019-08-07 DIAGNOSIS — M869 Osteomyelitis, unspecified: Secondary | ICD-10-CM

## 2019-08-10 NOTE — Progress Notes (Signed)
Subjective: 59 year old female presents the office today for follow-up evaluation of wound, osteomyelitis on the left fifth toe.  We recommend amputation previously but she would not proceed with certain antibiotics.  She states her toes been doing well and denies any drainage or pus.  She has been using Iodosorb. Still some swelling of the toe but not any worsening. Denies any systemic complaints such as fevers, chills, nausea, vomiting. No acute changes since last appointment, and no other complaints at this time.   Objective: AAO x3, NAD DP, PT pulses decreased.  X-ray ordered arterial studies back in April but she is never had this performed. Adductovarus present fifth toe has a hyperkeratotic lesion on the dorsal lateral aspect of the toe.  There is no underlying ulceration drainage.  Mild swelling to the toe but there is no erythema or warmth.  No fluctuation crepitation. No pain with calf compression, swelling, warmth, erythema  Assessment: Osteomyelitis left fifth toe  Plan: -All treatment options discussed with the patient including all alternatives, risks, complications.  -X-rays obtained reviewed.  Slight improvement compared to previous notes partial consolidation.  There is no soft tissue emphysema. -Discussed amputation but she declines.  Also order to follow-up on arterial studies for her but I also encouraged her to call for an appointment. -Continuing offloading shoe -Finish course of antibiotics -Ordered blood work.  Written for CBC, ESR, CRP to have done at dialysis and given our fax number. -Patient encouraged to call the office with any questions, concerns, change in symptoms.   Trula Slade DPM

## 2019-08-11 ENCOUNTER — Telehealth: Payer: Self-pay | Admitting: *Deleted

## 2019-08-11 NOTE — Telephone Encounter (Signed)
-----   Message from Trula Slade, DPM sent at 08/10/2019 10:27 AM EDT ----- She never had her arterial studies done. Can you let her know where they were ordered so she can get an appt? Thanks.

## 2019-08-11 NOTE — Telephone Encounter (Signed)
I spoke with pt and offered to give her the information to [facility] and she states she will have to call me back.

## 2019-08-18 ENCOUNTER — Other Ambulatory Visit: Payer: Self-pay | Admitting: *Deleted

## 2019-08-18 DIAGNOSIS — E11649 Type 2 diabetes mellitus with hypoglycemia without coma: Secondary | ICD-10-CM

## 2019-08-19 ENCOUNTER — Telehealth: Payer: Self-pay | Admitting: *Deleted

## 2019-08-19 NOTE — Telephone Encounter (Signed)
I called [facility] and the phone rang for over 1 minute without answering service.

## 2019-08-19 NOTE — Telephone Encounter (Signed)
I spoke with [facility], she states she will send what labs they have drawn, but can only draw certain labs with their doctor's orders only, pt would need to go to a laboratory or her doctor's office to have other labs performed.

## 2019-08-19 NOTE — Telephone Encounter (Signed)
Received [facility] labs and gave to Dr. Jacqualyn Posey for review.

## 2019-08-19 NOTE — Telephone Encounter (Signed)
I spoke with [facility] receptionist states pt may be at the [facility] [phone] or the [facility] [phone].

## 2019-08-20 MED ORDER — INSULIN DETEMIR 100 UNIT/ML FLEXPEN: PEN_INJECTOR | SUBCUTANEOUS | 11 refills | Status: DC

## 2019-10-02 ENCOUNTER — Ambulatory Visit: Payer: Medicare Other | Admitting: Podiatry

## 2019-10-16 ENCOUNTER — Ambulatory Visit: Payer: Medicare Other | Admitting: Podiatry

## 2019-11-04 ENCOUNTER — Other Ambulatory Visit: Payer: Self-pay | Admitting: Family Medicine

## 2019-11-11 ENCOUNTER — Emergency Department (HOSPITAL_COMMUNITY): Payer: Medicare Other

## 2019-11-11 ENCOUNTER — Inpatient Hospital Stay (HOSPITAL_COMMUNITY)
Admission: EM | Admit: 2019-11-11 | Discharge: 2019-11-17 | DRG: 640 | Disposition: A | Discharge status: Home Health | Payer: Medicare Other | Attending: Family Medicine | Admitting: Family Medicine

## 2019-11-11 ENCOUNTER — Encounter (HOSPITAL_COMMUNITY): Payer: Self-pay

## 2019-11-11 ENCOUNTER — Other Ambulatory Visit: Payer: Self-pay

## 2019-11-11 DIAGNOSIS — J9811 Atelectasis: Secondary | ICD-10-CM | POA: Diagnosis present

## 2019-11-11 DIAGNOSIS — Z79899 Other long term (current) drug therapy: Secondary | ICD-10-CM

## 2019-11-11 DIAGNOSIS — J9692 Respiratory failure, unspecified with hypercapnia: Secondary | ICD-10-CM

## 2019-11-11 DIAGNOSIS — R569 Unspecified convulsions: Secondary | ICD-10-CM | POA: Diagnosis not present

## 2019-11-11 DIAGNOSIS — N2581 Secondary hyperparathyroidism of renal origin: Secondary | ICD-10-CM | POA: Diagnosis present

## 2019-11-11 DIAGNOSIS — D631 Anemia in chronic kidney disease: Secondary | ICD-10-CM | POA: Diagnosis present

## 2019-11-11 DIAGNOSIS — I5033 Acute on chronic diastolic (congestive) heart failure: Secondary | ICD-10-CM | POA: Diagnosis not present

## 2019-11-11 DIAGNOSIS — E162 Hypoglycemia, unspecified: Secondary | ICD-10-CM | POA: Diagnosis not present

## 2019-11-11 DIAGNOSIS — Z8249 Family history of ischemic heart disease and other diseases of the circulatory system: Secondary | ICD-10-CM | POA: Diagnosis not present

## 2019-11-11 DIAGNOSIS — E1140 Type 2 diabetes mellitus with diabetic neuropathy, unspecified: Secondary | ICD-10-CM | POA: Diagnosis present

## 2019-11-11 DIAGNOSIS — J9602 Acute respiratory failure with hypercapnia: Secondary | ICD-10-CM | POA: Diagnosis present

## 2019-11-11 DIAGNOSIS — G9340 Encephalopathy, unspecified: Secondary | ICD-10-CM | POA: Diagnosis not present

## 2019-11-11 DIAGNOSIS — Z9981 Dependence on supplemental oxygen: Secondary | ICD-10-CM

## 2019-11-11 DIAGNOSIS — J9621 Acute and chronic respiratory failure with hypoxia: Secondary | ICD-10-CM | POA: Diagnosis not present

## 2019-11-11 DIAGNOSIS — D539 Nutritional anemia, unspecified: Secondary | ICD-10-CM | POA: Diagnosis present

## 2019-11-11 DIAGNOSIS — I132 Hypertensive heart and chronic kidney disease with heart failure and with stage 5 chronic kidney disease, or end stage renal disease: Secondary | ICD-10-CM | POA: Diagnosis present

## 2019-11-11 DIAGNOSIS — I2510 Atherosclerotic heart disease of native coronary artery without angina pectoris: Secondary | ICD-10-CM | POA: Diagnosis present

## 2019-11-11 DIAGNOSIS — J9691 Respiratory failure, unspecified with hypoxia: Secondary | ICD-10-CM | POA: Diagnosis present

## 2019-11-11 DIAGNOSIS — G4733 Obstructive sleep apnea (adult) (pediatric): Secondary | ICD-10-CM | POA: Diagnosis present

## 2019-11-11 DIAGNOSIS — J189 Pneumonia, unspecified organism: Secondary | ICD-10-CM

## 2019-11-11 DIAGNOSIS — I480 Paroxysmal atrial fibrillation: Secondary | ICD-10-CM | POA: Diagnosis present

## 2019-11-11 DIAGNOSIS — J9601 Acute respiratory failure with hypoxia: Secondary | ICD-10-CM | POA: Diagnosis present

## 2019-11-11 DIAGNOSIS — G9349 Other encephalopathy: Secondary | ICD-10-CM | POA: Diagnosis present

## 2019-11-11 DIAGNOSIS — M549 Dorsalgia, unspecified: Secondary | ICD-10-CM | POA: Diagnosis not present

## 2019-11-11 DIAGNOSIS — E1122 Type 2 diabetes mellitus with diabetic chronic kidney disease: Secondary | ICD-10-CM | POA: Diagnosis present

## 2019-11-11 DIAGNOSIS — N186 End stage renal disease: Secondary | ICD-10-CM

## 2019-11-11 DIAGNOSIS — R1110 Vomiting, unspecified: Secondary | ICD-10-CM | POA: Diagnosis present

## 2019-11-11 DIAGNOSIS — Z7982 Long term (current) use of aspirin: Secondary | ICD-10-CM

## 2019-11-11 DIAGNOSIS — D7589 Other specified diseases of blood and blood-forming organs: Secondary | ICD-10-CM | POA: Diagnosis present

## 2019-11-11 DIAGNOSIS — Z6839 Body mass index (BMI) 39.0-39.9, adult: Secondary | ICD-10-CM

## 2019-11-11 DIAGNOSIS — E785 Hyperlipidemia, unspecified: Secondary | ICD-10-CM | POA: Diagnosis present

## 2019-11-11 DIAGNOSIS — Z89431 Acquired absence of right foot: Secondary | ICD-10-CM | POA: Diagnosis not present

## 2019-11-11 DIAGNOSIS — Z09 Encounter for follow-up examination after completed treatment for conditions other than malignant neoplasm: Secondary | ICD-10-CM

## 2019-11-11 DIAGNOSIS — K219 Gastro-esophageal reflux disease without esophagitis: Secondary | ICD-10-CM | POA: Diagnosis present

## 2019-11-11 DIAGNOSIS — E11649 Type 2 diabetes mellitus with hypoglycemia without coma: Secondary | ICD-10-CM | POA: Diagnosis not present

## 2019-11-11 DIAGNOSIS — Z20822 Contact with and (suspected) exposure to covid-19: Secondary | ICD-10-CM | POA: Diagnosis present

## 2019-11-11 DIAGNOSIS — J181 Lobar pneumonia, unspecified organism: Secondary | ICD-10-CM | POA: Diagnosis not present

## 2019-11-11 DIAGNOSIS — J9690 Respiratory failure, unspecified, unspecified whether with hypoxia or hypercapnia: Secondary | ICD-10-CM

## 2019-11-11 DIAGNOSIS — Z794 Long term (current) use of insulin: Secondary | ICD-10-CM

## 2019-11-11 DIAGNOSIS — R278 Other lack of coordination: Secondary | ICD-10-CM | POA: Diagnosis present

## 2019-11-11 DIAGNOSIS — Z8674 Personal history of sudden cardiac arrest: Secondary | ICD-10-CM | POA: Diagnosis not present

## 2019-11-11 DIAGNOSIS — H179 Unspecified corneal scar and opacity: Secondary | ICD-10-CM | POA: Diagnosis present

## 2019-11-11 DIAGNOSIS — E872 Acidosis: Secondary | ICD-10-CM | POA: Diagnosis present

## 2019-11-11 DIAGNOSIS — Z992 Dependence on renal dialysis: Secondary | ICD-10-CM | POA: Diagnosis not present

## 2019-11-11 DIAGNOSIS — E8770 Fluid overload, unspecified: Principal | ICD-10-CM | POA: Diagnosis present

## 2019-11-11 DIAGNOSIS — R918 Other nonspecific abnormal finding of lung field: Secondary | ICD-10-CM

## 2019-11-11 DIAGNOSIS — E8889 Other specified metabolic disorders: Secondary | ICD-10-CM | POA: Diagnosis present

## 2019-11-11 DIAGNOSIS — J810 Acute pulmonary edema: Secondary | ICD-10-CM | POA: Diagnosis not present

## 2019-11-11 DIAGNOSIS — J9622 Acute and chronic respiratory failure with hypercapnia: Secondary | ICD-10-CM | POA: Diagnosis not present

## 2019-11-11 HISTORY — DX: Pneumonia, unspecified organism: J18.9

## 2019-11-11 LAB — COMPREHENSIVE METABOLIC PANEL
ALT: 28 U/L (ref 0–44)
AST: 23 U/L (ref 15–41)
Albumin: 3.1 g/dL — ABNORMAL LOW (ref 3.5–5.0)
Alkaline Phosphatase: 93 U/L (ref 38–126)
Anion gap: 19 — ABNORMAL HIGH (ref 5–15)
BUN: 39 mg/dL — ABNORMAL HIGH (ref 6–20)
CO2: 27 mmol/L (ref 22–32)
Calcium: 9 mg/dL (ref 8.9–10.3)
Chloride: 90 mmol/L — ABNORMAL LOW (ref 98–111)
Creatinine, Ser: 9.64 mg/dL — ABNORMAL HIGH (ref 0.44–1.00)
GFR, Estimated: 4 mL/min — ABNORMAL LOW (ref >60)
Glucose, Bld: 308 mg/dL — ABNORMAL HIGH (ref 70–99)
Potassium: 3.8 mmol/L (ref 3.5–5.1)
Sodium: 136 mmol/L (ref 135–145)
Total Bilirubin: 1 mg/dL (ref 0.3–1.2)
Total Protein: 6.9 g/dL (ref 6.5–8.1)

## 2019-11-11 LAB — CBC WITH DIFFERENTIAL/PLATELET
Abs Immature Granulocytes: 0 10*3/uL (ref 0.00–0.07)
Band Neutrophils: 1 %
Basophils Absolute: 0 10*3/uL (ref 0.0–0.1)
Basophils Relative: 0 %
Eosinophils Absolute: 0.5 10*3/uL (ref 0.0–0.5)
Eosinophils Relative: 2 %
HCT: 41.8 % (ref 36.0–46.0)
Hemoglobin: 12.7 g/dL (ref 12.0–15.0)
Lymphocytes Relative: 10 %
Lymphs Abs: 2.3 10*3/uL (ref 0.7–4.0)
MCH: 33.5 pg (ref 26.0–34.0)
MCHC: 30.4 g/dL (ref 30.0–36.0)
MCV: 110.3 fL — ABNORMAL HIGH (ref 80.0–100.0)
Monocytes Absolute: 1.4 10*3/uL — ABNORMAL HIGH (ref 0.1–1.0)
Monocytes Relative: 6 %
Neutro Abs: 18.8 10*3/uL — ABNORMAL HIGH (ref 1.7–7.7)
Neutrophils Relative %: 81 %
Platelets: 274 10*3/uL (ref 150–400)
RBC: 3.79 MIL/uL — ABNORMAL LOW (ref 3.87–5.11)
RDW: 17.6 % — ABNORMAL HIGH (ref 11.5–15.5)
WBC: 22.9 10*3/uL — ABNORMAL HIGH (ref 4.0–10.5)
nRBC: 0.7 % — ABNORMAL HIGH (ref 0.0–0.2)
nRBC: 2 /100 WBC — ABNORMAL HIGH

## 2019-11-11 LAB — RESPIRATORY PANEL BY RT PCR (FLU A&B, COVID)
Influenza A by PCR: NEGATIVE
Influenza B by PCR: NEGATIVE
SARS Coronavirus 2 by RT PCR: NEGATIVE

## 2019-11-11 LAB — CBG MONITORING, ED: Glucose-Capillary: 248 mg/dL — ABNORMAL HIGH (ref 70–99)

## 2019-11-11 LAB — LIPASE, BLOOD: Lipase: 24 U/L (ref 11–51)

## 2019-11-11 LAB — ETHANOL: Alcohol, Ethyl (B): <10 mg/dL (ref <10)

## 2019-11-11 MED ORDER — INSULIN ASPART 100 UNIT/ML ~~LOC~~ SOLN
0.0000 [IU] | Freq: Three times a day (TID) | SUBCUTANEOUS | Status: DC
  Administered 2019-11-12: 5 [IU] via SUBCUTANEOUS
  Administered 2019-11-14 (×2): 3 [IU] via SUBCUTANEOUS
  Administered 2019-11-16 (×3): 2 [IU] via SUBCUTANEOUS

## 2019-11-11 MED ORDER — VANCOMYCIN HCL IN DEXTROSE 1-5 GM/200ML-% IV SOLN
1000.0000 mg | INTRAVENOUS | Status: DC
  Administered 2019-11-12: 1000 mg via INTRAVENOUS
  Filled 2019-11-11: qty 200

## 2019-11-11 MED ORDER — CARVEDILOL 12.5 MG PO TABS: 25.0000 mg | ORAL_TABLET | Freq: Two times a day (BID) | ORAL | Status: DC

## 2019-11-11 MED ORDER — ONDANSETRON HCL 4 MG/2ML IJ SOLN
4.0000 mg | Freq: Once | INTRAMUSCULAR | Status: AC
  Administered 2019-11-11: 4 mg via INTRAVENOUS
  Filled 2019-11-11: qty 2

## 2019-11-11 MED ORDER — SODIUM CHLORIDE 0.9 % IV SOLN
2.0000 g | Freq: Once | INTRAVENOUS | Status: AC
  Administered 2019-11-11: 2 g via INTRAVENOUS
  Filled 2019-11-11: qty 2

## 2019-11-11 MED ORDER — ATORVASTATIN CALCIUM 80 MG PO TABS
80.0000 mg | ORAL_TABLET | Freq: Every day | ORAL | Status: DC
  Administered 2019-11-12 – 2019-11-17 (×5): 80 mg via ORAL
  Filled 2019-11-11: qty 1
  Filled 2019-11-11 (×2): qty 2
  Filled 2019-11-11 (×2): qty 1

## 2019-11-11 MED ORDER — INSULIN DETEMIR 100 UNIT/ML ~~LOC~~ SOLN
25.0000 [IU] | Freq: Two times a day (BID) | SUBCUTANEOUS | Status: DC
  Administered 2019-11-12: 25 [IU] via SUBCUTANEOUS
  Filled 2019-11-11 (×4): qty 0.25

## 2019-11-11 MED ORDER — HEPARIN SODIUM (PORCINE) 5000 UNIT/ML IJ SOLN
5000.0000 [IU] | Freq: Three times a day (TID) | INTRAMUSCULAR | Status: DC
  Administered 2019-11-11 – 2019-11-17 (×16): 5000 [IU] via SUBCUTANEOUS
  Filled 2019-11-11 (×16): qty 1

## 2019-11-11 MED ORDER — VANCOMYCIN HCL 2000 MG/400ML IV SOLN
2000.0000 mg | Freq: Once | INTRAVENOUS | Status: AC
  Administered 2019-11-11: 2000 mg via INTRAVENOUS
  Filled 2019-11-11: qty 400

## 2019-11-11 MED ORDER — DIPHENHYDRAMINE HCL 50 MG/ML IJ SOLN
25.0000 mg | Freq: Once | INTRAMUSCULAR | Status: AC
  Administered 2019-11-11: 25 mg via INTRAVENOUS
  Filled 2019-11-11: qty 1

## 2019-11-11 MED ORDER — METOCLOPRAMIDE HCL 5 MG/ML IJ SOLN
10.0000 mg | Freq: Once | INTRAMUSCULAR | Status: AC
  Administered 2019-11-11: 10 mg via INTRAVENOUS
  Filled 2019-11-11: qty 2

## 2019-11-11 NOTE — ED Provider Notes (Addendum)
[facility] EMERGENCY DEPARTMENT Provider Note   CSN: [id number] Arrival date & time: 11/11/19  1751     History No chief complaint on file.   Rachel Chaney is a 59 y.o. female presenting for evaluation of sz.   Level V caveat due to AMS  History provided mostly by EMS.  Per EMS, they were called out due to seizure.  Patient had supposedly seizure-like activity for approximately 10 minutes prior to arrival.  Patient was postictal on arrival without seizure-like activity.  She had evidence of emesis.  In route, patient had an episode of upper extremity shaking, however was never unresponsive.  She was found to be hypoxic however in the 70s, placed on 15 L via nonrebreather.  No history of seizures per family.  Patient reports nausea, no other complaints.  She denies recent fevers, chills, cough, chest pain, abdominal pain.  Patient is ESRD on dialysis, goes Monday, Wednesday, Friday.  Last dialysis session was yesterday, was normal.  Additional history obtained from chart review.  Patient with a history of obesity, hyperlipidemia, hypertension, GERD, ESRD on dialysis, diabetes  HPI     Past Medical History:  Diagnosis Date  . Cardiac arrest ([place]) 02/28/2018   while on hemodialysis in hospital  . CHF (congestive heart failure) ([place])   . Chronic kidney disease due to diabetes mellitus ([place]) 10/13/2013  . Diabetes mellitus    Type II  . ESRD (end stage renal disease) ([place])    MWF - [facility]  . Hyperlipidemia   . Hypertension   . Neuropathy     Patient Active Problem List   Diagnosis Date Noted  . S/P transmetatarsal amputation of foot, right ([place]) 04/24/2018  . PAF (paroxysmal atrial fibrillation) ([place])   . Oxygen dependent   . Type 2 diabetes mellitus with diabetic neuropathy ([place])   . Severe protein-calorie malnutrition ([place])   . Seasonal allergies 04/12/2017  . Post-menopausal bleeding 12/07/2015  . ESRD on dialysis ([place]) 05/25/2015    . Hypoalbuminemia   . Abnormal appearance of cervix 08/08/2011  . Back pain 08/07/2011  . GERD (gastroesophageal reflux disease) 04/27/2010  . Hyperlipidemia 03/08/2006  . Morbid obesity ([place]) 03/08/2006  . Essential hypertension with morbid obesity 03/08/2006    Past Surgical History:  Procedure Laterality Date  . AMPUTATION Right 02/27/2018   Procedure: RIGHT FOOT 2ND AND 3RD RAY AMPUTATION;  Surgeon: [name], MD;  Location: [place];  Service: Orthopedics;  Laterality: Right;  . AMPUTATION Right 04/24/2018   Procedure: RIGHT TRANSMETATARSAL AMPUTATION, PLACE VAC;  Surgeon: [name], MD;  Location: [place];  Service: Orthopedics;  Laterality: Right;  . AV FISTULA PLACEMENT Left 04/15/2015   Procedure: ARTERIOVENOUS (AV) FISTULA CREATION;  Surgeon: Mal Misty, MD;  Location: [facility];  Service: Vascular;  Laterality: Left;  . EYE SURGERY Right    retinal detachment  . FISTULA SUPERFICIALIZATION Left 06/03/2015   Procedure: LEFT UPPER ARM BRACHIOCEPHALIC FISTULA SUPERFICIALIZATION;  Surgeon: Mal Misty, MD;  Location: [place];  Service: Vascular;  Laterality: Left;  from [place] to General  . STUMP REVISION Right 05/24/2018   Procedure: REVISION RIGHT TRANSMETATARSAL AMPUTATION WITH EXCISION BONE/SOFT TISSUE, LOCAL TISSUE  RE-ARRANGEMENT  AND VAC PLACEMENT;  Surgeon: [name], MD;  Location: [facility];  Service: Orthopedics;  Laterality: Right;     OB History   No obstetric history on file.     Family History  Problem Relation Age of Onset  .  Heart disease Mother   . Heart disease Father     Social History   Tobacco Use  . Smoking status: Never Smoker  . Smokeless tobacco: Never Used  Vaping Use  . Vaping Use: Never used  Substance Use Topics  . Alcohol use: No    Alcohol/week: 0.0 standard drinks  . Drug use: No    Home Medications Prior to Admission medications   Medication Sig Start Date End Date Taking? Authorizing Provider  acetaminophen (TYLENOL) 500 MG  tablet Take 1,000 mg by mouth every 6 (six) hours as needed for pain.    [provider]  aspirin EC 81 MG tablet Take 1 tablet (81 mg total) by mouth daily. 04/30/18   Erlene Quan, PA-C  atorvastatin (LIPITOR) 80 MG tablet TAKE 1 TABLET(80 MG) BY MOUTH AT BEDTIME 11/04/19   Benay Pike, MD  Blood Glucose Monitoring Suppl (ONE TOUCH ULTRA 2) w/Device KIT 1 kit by Does not apply route 3 (three) times daily. ICD-10 code: E11.40 03/26/18   Wille Celeste, PA-C  cadexomer iodine (IODOSORB) 0.9 % gel Apply to left 5th digit wounds once daily 07/22/19   Marzetta Board, DPM  carvedilol (COREG) 25 MG tablet TAKE 1 TABLET(25 MG) BY MOUTH TWICE DAILY 05/10/19   Benay Pike, MD  cetirizine (ZYRTEC) 10 MG tablet Take by mouth. 04/26/18   [provider]  cyclobenzaprine (FLEXERIL) 10 MG tablet Take 1 tablet (10 mg total) by mouth 3 times/day as needed-between meals & bedtime for muscle spasms. 06/16/19   Benay Pike, MD  Etelcalcetide HCl (PARSABIV IV) Etelcalcetide Hermina Staggers) 02/10/19 02/09/20  [provider]  ferric citrate (AURYXIA) 1 GM 210 MG(Fe) tablet Take 2 tablets (420 mg total) by mouth 3 (three) times daily with meals. Give two (420 mg) by mouth three times a day wih meals for ESRD Patient taking differently: Take 210-420 mg by mouth See admin instructions. Take 420 mg with each meal and 210 mg with snacks 03/26/18   Lassen, Arlo C, PA-C  fluticasone (FLONASE) 50 MCG/ACT nasal spray Place 1 spray into both nostrils daily. 06/16/19   Benay Pike, MD  glucose blood (ONE TOUCH ULTRA TEST) test strip 1 each by Other route 3 (three) times daily. ICD-10 code: E11.40. 03/26/18   Wille Celeste, PA-C  hydrALAZINE (APRESOLINE) 25 MG tablet Take 25 mg by mouth 2 (two) times a day.    [provider]  HYDROcodone-acetaminophen (NORCO) 5-325 MG tablet Take 1 tablet by mouth every 6 (six) hours as needed for moderate pain. 05/24/18   Rayburn, Neta Mends, PA-C    hydrOXYzine (ATARAX/VISTARIL) 50 MG tablet Take by mouth. 07/09/17   [provider]  insulin detemir (LEVEMIR) 100 UNIT/ML FlexPen 50U in the am and 40U in pm 08/20/19   Benay Pike, MD  Insulin Pen Needle (B-D UF III MINI PEN NEEDLES) 31G X 5 MM MISC USE AS DIRECTED TWICE DAILY 04/30/19   Benay Pike, MD  Insulin Pen Needle 29G X 12MM MISC Inject 1 pen into the skin 2 (two) times daily. Check blood sugar daily. 03/26/18   Granville Lewis C, PA-C  Insulin Syringe-Needle U-100 Flossie Buffy INSULIN SYRINGE) 30G X 5/16" 1 ML MISC USE AS DIRECTED 03/26/18   [place] Lewis C, PA-C  Methoxy PEG-Epoetin Beta (MIRCERA IJ) Mircera 01/31/19 01/30/20  [provider]  multivitamin (RENA-VIT) TABS tablet Take 1 tablet by mouth daily.    [provider]  nitroGLYCERIN (NITRODUR -  DOSED IN MG/24 HR) 0.2 mg/hr patch Place 1 patch (0.2 mg total) onto the skin daily. 05/23/18   Rayburn, Neta Mends, PA-C  OneTouch Delica Lancets 01B MISC 1 Device by Does not apply route as needed (to check blood glucose). 03/26/18   [place] Lewis C, PA-C  pentoxifylline (TRENTAL) 400 MG CR tablet Take 1 tablet (400 mg total) by mouth 3 (three) times daily with meals. 05/23/18   Rayburn, Neta Mends, PA-C  sucroferric oxyhydroxide (VELPHORO) 500 MG chewable tablet Chew by mouth. 07/30/19   [provider]  VELPHORO 500 MG chewable tablet Chew 1,000 mg by mouth 3 (three) times daily. 07/30/19   [provider]  progesterone (PROMETRIUM) 200 MG capsule Take 1 capsule (200 mg total) by mouth daily. Take for 10 days if bleeding occurs for more than 4 days. 11/09/10 03/28/11  Lyndal Pulley, DO    Allergies    Patient has no known allergies.  Review of Systems   Review of Systems  Unable to perform ROS: Mental status change  Gastrointestinal: Positive for nausea.    Physical Exam Updated Vital Signs BP (!) 109/59   Pulse 91   Temp 98.1 F (36.7 C) (Oral)   Resp 14   Ht _0   (1.626 m)   Wt 104.5 kg   LMP 09/18/2011   SpO2 99%   BMI 39.54 kg/m   Physical Exam Vitals and nursing note reviewed.  Constitutional:      Appearance: She is well-developed. She is obese.     Comments: Appears confused, on NRB  HENT:     Head: Normocephalic and atraumatic.  Eyes:     Extraocular Movements: Extraocular movements intact.     Conjunctiva/sclera: Conjunctivae normal.     Pupils: Pupils are equal, round, and reactive to light.  Cardiovascular:     Rate and Rhythm: Normal rate and regular rhythm.     Pulses: Normal pulses.  Pulmonary:     Effort: Pulmonary effort is normal. No respiratory distress.     Breath sounds: Normal breath sounds. No wheezing.     Comments: On NRB. Speaking in short sentences. Rales in lower lobes, L > R Abdominal:     General: There is no distension.     Palpations: Abdomen is soft. There is no mass.     Tenderness: There is no abdominal tenderness. There is no guarding or rebound.  Musculoskeletal:        General: Normal range of motion.     Cervical back: Normal range of motion and neck supple.     Right lower leg: Edema present.     Left lower leg: Edema present.     Comments: Pitting edema, R >L  Skin:    General: Skin is warm and dry.     Capillary Refill: Capillary refill takes less than 2 seconds.  Neurological:     Mental Status: She is oriented to person, place, and time.  Psychiatric:     Comments: Odd affect      ED Results / Procedures / Treatments   Labs (all labs ordered are listed, but only abnormal results are displayed) Labs Reviewed  CBC WITH DIFFERENTIAL/PLATELET - Abnormal; Notable for the following components:      Result Value   WBC 22.9 (*)    RBC 3.79 (*)    MCV 110.3 (*)    RDW 17.6 (*)    nRBC 0.7 (*)    Neutro Abs 18.8 (*)    Monocytes  Absolute 1.4 (*)    nRBC 2 (*)    All other components within normal limits  COMPREHENSIVE METABOLIC PANEL - Abnormal; Notable for the following components:    Chloride 90 (*)    Glucose, Bld 308 (*)    BUN 39 (*)    Creatinine, Ser 9.64 (*)    Albumin 3.1 (*)    GFR, Estimated 4 (*)    Anion gap 19 (*)    All other components within normal limits  CBG MONITORING, ED - Abnormal; Notable for the following components:   Glucose-Capillary 248 (*)    All other components within normal limits  RESPIRATORY PANEL BY RT PCR (FLU A&B, COVID)  LIPASE, BLOOD  ETHANOL  RAPID URINE DRUG SCREEN, HOSP PERFORMED  I-STAT VENOUS BLOOD GAS, ED    EKG None  Radiology DG Chest 2 View  Result Date: 11/11/2019 CLINICAL DATA:  Hypoxia. EXAM: CHEST - 2 VIEW COMPARISON:  March 10, 2018. FINDINGS: Stable cardiomegaly. No pneumothorax is noted. Stable bibasilar opacities are noted concerning for pneumonia. No significant pleural effusion is noted. Bony thorax is unremarkable. IMPRESSION: Stable bibasilar opacities concerning for pneumonia. Aortic Atherosclerosis (ICD10-I70.0). Electronically Signed   By: Marijo Conception M.D.   On: 11/11/2019 18:39   CT HEAD WO CONTRAST  Result Date: 11/11/2019 CLINICAL DATA:  Seizure activity, vomiting, postictal EXAM: CT HEAD WITHOUT CONTRAST TECHNIQUE: Contiguous axial images were obtained from the base of the skull through the vertex without intravenous contrast. COMPARISON:  03/06/2018 FINDINGS: Brain: No acute infarct or hemorrhage. Lateral ventricles and midline structures are stable. Stable scattered hypodensities in the periventricular white matter consistent with chronic small vessel ischemic change. No acute extra-axial fluid collections. No mass effect. Vascular: No hyperdense vessel or unexpected calcification. Skull: Normal. Negative for fracture or focal lesion. Sinuses/Orbits: No acute finding. Other: None. IMPRESSION: 1. Stable exam, no acute process. Electronically Signed   By: Randa Ngo M.D.   On: 11/11/2019 19:37    Procedures .Critical Care Performed by: Franchot Heidelberg, PA-C Authorized by: Franchot Heidelberg, PA-C   Critical care provider statement:    Critical care time (minutes):  45   Critical care time was exclusive of:  Teaching time and separately billable procedures and treating other patients   Critical care was necessary to treat or prevent imminent or life-threatening deterioration of the following conditions:  Respiratory failure and CNS failure or compromise   Critical care was time spent personally by me on the following activities:  Blood draw for specimens, development of treatment plan with patient or surrogate, discussions with consultants, evaluation of patient's response to treatment, examination of patient, obtaining history from patient or surrogate, ordering and performing treatments and interventions, ordering and review of laboratory studies, ordering and review of radiographic studies, pulse oximetry, re-evaluation of patient's condition and review of old charts   I assumed direction of critical care for this patient from another provider in my specialty: no   Comments:     Pt with PNA with new O2 demand requiring IV abx and hospitalization.    (including critical care time)  Medications Ordered in ED Medications  vancomycin (VANCOREADY) IVPB 2000 mg/400 mL (has no administration in time range)  vancomycin (VANCOCIN) IVPB 1000 mg/200 mL premix (has no administration in time range)  metoCLOPramide (REGLAN) injection 10 mg (has no administration in time range)  diphenhydrAMINE (BENADRYL) injection 25 mg (has no administration in time range)  ceFEPIme (MAXIPIME) 2 g in sodium chloride 0.9 % 100  mL IVPB (0 g Intravenous Stopped 11/11/19 2058)  ondansetron (ZOFRAN) injection 4 mg (4 mg Intravenous Given 11/11/19 1902)    ED Course  I have reviewed the triage vital signs and the nursing notes.  Pertinent labs & imaging results that were available during my care of the patient were reviewed by me and considered in my medical decision making (see chart for details).      MDM Rules/Calculators/A&P                          Patient presenting for evaluation after a supposed seizure.  On exam, patient is mildly altered, difficult to respond to direct questions.  On a nonrebreather due to hypoxia, she is concerning rales in her lower lobes and pitting edema.  In the setting of ESRD, concern for fluid overload.  Also consider infectious cause such as pneumonia or Covid.  The setting of persistent hypoxia, doubt true seizure, though per EMS patient had a postictal phase.  Patient also did have emesis, consider aspiration.  Will obtain labs, chest x-ray, and CT head due to new onset seizure.  Labs interpreted by me, shows leukocytosis of 22.  Some of this may be elevated due to possible seizure-like activity.  Chest x-ray viewed interpreted by me, consistent bilateral lobe pneumonia, likely also contributing to the white count.  Patient remains on oxygen, though no longer on a nonrebreather.  She is alert and oriented at this time, no further seizure-like activity noted in the ED.  She will need to be admitted due to pneumonia with new oxygen demand.  Labs overall otherwise reassuring.  Covid negative. CT head without acute findings. Will call for admission.   Discussed with family medicine, patient to be admitted.  Requesting neuro consult due to possible seizure.  Discussed with Dr. Cheral Marker from neurology, who will consult.  Final Clinical Impression(s) / ED Diagnoses Final diagnoses:  Community acquired bilateral lower lobe pneumonia    Rx / DC Orders ED Discharge Orders    None       Franchot Heidelberg, PA-C 11/11/19 2147    Franchot Heidelberg, PA-C 11/11/19 2150    Lucrezia Starch, MD 11/11/19 2255

## 2019-11-11 NOTE — H&P (Addendum)
[facility] Admission History and Physical Service Pager: [phone]  Patient name: Rachel Chaney Medical record number: [id number] Date of birth: Feb 04, 1960 Age: 59 y.o. Gender: female  Primary Care Provider: Benay Pike, MD Consultants: Tommie Raymond, Neuro Code Status: Full - discussed with patient's husband Preferred Emergency Contact: Husband Marney Treloar [phone]  Chief Complaint: Seizure like activity, vomiting  Assessment and Plan: Rachel Chaney is a 59 y.o. female presenting with seizure-like activity and vomiting. PMH is significant for ESRD on HD MWF, T2DM, Macrocytic anemia, HLD, HTN, GERD and H/o Paroxysmal Afib.  Community-acquired vs Aspiration Pneumonia: Per patient's husband the patient and he were at home when she became somnolent, developed nausea, then vomited. Directly after she became "limp", per the husband, who then called 911. In the ED WBC count found to be elevated at 22.9, chest Xray showed bibasilar opacities, left greater than right concerning for pneumonia.  Her GCS was 12 and SPO2 in the 70's at room air. She was placed on NRB which brought her SPO2 into 90's. She was weaned off NRB 15L and was placed on 4L [place], SPO2- 93%. Patient remains afebrile since arriving, RR wnl, BP normal, and heart rate within normal limits. COVID and Flu tests all negative, per the husband the patient was vaccinated against both at dialysis, he is not sure exactly when. Low concern for PE as Well's Score is 0. Low concern for hypercarbia as patient's Bicarb on admission is wnl at 27. Not sure if CAP caused a seizure or if a seizure called aspiration. She was started on both Cefepime and Vancomycin in the ED. -Admit to med-tele, attending Dr. Thompson Grayer -Follow up Arterial blood gas -Consider repeat AM CXR or Chest CT if condition worsens -Am labs CBC, CMP -Maintain O2 sats -Vitals per routine -PT/OT ordered -Continue Vancomycin, Cefepime -Continuous  cardiac and pulse ox monitoring  Concern for seizure-like activity: patient was brought in by EMS after reported seizure-like activity at home (described by husband as patient went "numb, limp").While in EMS patient had some posturing but no other seizure-like activity. There was no tongue-biting or loss of bowel/bladder function. In the ED patient did not have any seizure like activity, however it was noted that she had an elevated WBC count of 22.9, could be due to either acute infection or recent seizure-activity. GCS 12. No history of prior seizures, she had heavy vomiting after she began to feel poorly. No report of trauma or hitting her head. CT Head was done shows stable exam, no acute process. CBG was 308. Ethanol level undetectable, we were unable to question patient about alcohol use however there is no history of such. Neurology consulted, planning to do EEG. She is without any more seizure-like activity in the ED without having received treatment. Don't know if acute pulmonary illness lowered her seizure threshold or she had a seizure and aspirated.  -Neurology consulted - appreciated recs -Follow up EEG -No AEDs for now -Seizure precautions -Toxicology screen ordered, follow up -Maintain NPO until patient is more alert -Follow up alcohol/opioid use when patient more alert  ESRD on HD MWF: Diagnosed in 2016 or 2017, last HD session was yesterday 11/1, completed full session. Gets dialysis at [facility] near [place]. -Nephrology consulted -AM CMP, monitor electrolytes -Vanc with HD MWF  T2DM: Most recent A1c 7.7% in March 2021. CBG at admission was 308. Her home meds includes Insulin levemir 50 Daily and 40 nightly.  -Start with Novolog insulin moderate sliding scale TID  with meals  -Levemir 25 mg BID (can increase as patient starts eating again) -CBGS 4x daily -Follow up HbA1c 11/3  Macrocytosis: MCV noted to be 110 on admission however Hgb wnl at 12.7. Pt takes Ferric citrate 1 g  201 mg TID Daily. Anemia panel ordered. -Follow up Ferritin, Reticulocyte count, Vit B12, B9, Iron and TIBC -TSH  HLD: ASCVD risk 9.1%, patient already on Lipitor 81m daily.  -Continue home Atorva 80 -Follow up lipid panel  Essential HTN: BPs on admission within normal limits. Pt takes Carvedilol 25 mg BID and Hydralazine 25 mg BID.  -We'll continue Carvedilol -Hold off Hydralazine, can add back as BP indicates  History of paroxysmal Afib: Pt takes Aspirin EC 81 mg Daily. Patient in normal sinus rhythm in the ED. EKG w/o pAF.  -Hold home ASA 81 due to new recommendation to not start as primary prevention in patient's 681 years older -Continuous cardiac monitoring  FEN/GI: No IV fluids due to ESKD. NPO for now pending AMS.   Prophylaxis: Heparin 5000 units Q8H for DVT Prophylaxis.   Disposition: Medical Telemetry  History of Present Illness:  Rachel CIPRIANIis a 59y.o. female presenting with seizure like activity and vomiting. On admission, her GCS was 12 and SPO2 in the 70's at room air. She was placed on NRB which brought her SPO2 into 90's. On Examination, Pt is drooling all over herself and  is not able to answer questions. Pt is oriented to place but not to time. Pt is not able to explain what brought her here. Pt's husband was called and he provided the following information.   Pneumonia - per husband she has been coughing steadily for months. He felt it was getting worse over the last months. EMS called out to her house due to a seizure, lasted 10 minutes and resolved on its own. She was post ictal, was hypoxic, and had vomited. Pt was alert and oriented in EMS with upper extremity tremors. No seizure activity was observed here, has continued to need O2. Pt has wet breath sounds, fluid over loaded. Per husband, she was just sittig there on the couch and she knocked a bottle over on the table. Husband was talking ot her then and then she wasn't responding to what he was saying. Then  she looked like she wanted to vomit, husband grabbed a bucket for her to vomit. Then she wasn't having responding and wasn't shaking, she just went numb/limp. She has Oxygen to be used at home but has been breathing well on her own. She does not have CPAP machine for at night.   COVID Vaccine  - Per husband she received COVID vaccine from dialysis place ?3-4 months ago. Flu Vaccine - was also received from dialysis place.  Review Of Systems: Per HPI with the following additions: Seizure like activity, Limp, vomiting, AMS, no loss of bowel or bladder functions, no tongue biting.   Review of Systems  Constitutional: Negative for fever.  HENT: Positive for drooling.   Respiratory: Positive for cough and shortness of breath.   Gastrointestinal: Positive for vomiting. Negative for abdominal distention.  Skin: Negative for rash.  Neurological: Positive for seizures.     Patient Active Problem List   Diagnosis Date Noted  . Community acquired pneumonia 11/11/2019  . S/P transmetatarsal amputation of foot, right ([place] 04/24/2018  . PAF (paroxysmal atrial fibrillation) ([place]   . Oxygen dependent   . Type 2 diabetes mellitus with diabetic neuropathy ([place]   .  Severe protein-calorie malnutrition ([place])   . Seasonal allergies 04/12/2017  . Post-menopausal bleeding 12/07/2015  . ESRD on dialysis ([place]) 05/25/2015  . Hypoalbuminemia   . Abnormal appearance of cervix 08/08/2011  . Back pain 08/07/2011  . GERD (gastroesophageal reflux disease) 04/27/2010  . Hyperlipidemia 03/08/2006  . Morbid obesity ([place]) 03/08/2006  . Essential hypertension with morbid obesity 03/08/2006    Past Medical History: Past Medical History:  Diagnosis Date  . Cardiac arrest ([place]) 02/28/2018   while on hemodialysis in hospital  . CHF (congestive heart failure) ([place])   . Chronic kidney disease due to diabetes mellitus ([place]) 10/13/2013  . Diabetes mellitus    Type II  . ESRD (end stage renal disease) ([place])    MWF  - [facility]  . Hyperlipidemia   . Hypertension   . Neuropathy     Past Surgical History: Past Surgical History:  Procedure Laterality Date  . AMPUTATION Right 02/27/2018   Procedure: RIGHT FOOT 2ND AND 3RD RAY AMPUTATION;  Surgeon: [name], MD;  Location: [facility];  Service: Orthopedics;  Laterality: Right;  . AMPUTATION Right 04/24/2018   Procedure: RIGHT TRANSMETATARSAL AMPUTATION, PLACE VAC;  Surgeon: [name], MD;  Location: [place];  Service: Orthopedics;  Laterality: Right;  . AV FISTULA PLACEMENT Left 04/15/2015   Procedure: ARTERIOVENOUS (AV) FISTULA CREATION;  Surgeon: Mal Misty, MD;  Location: [facility];  Service: Vascular;  Laterality: Left;  . EYE SURGERY Right    retinal detachment  . FISTULA SUPERFICIALIZATION Left 06/03/2015   Procedure: LEFT UPPER ARM BRACHIOCEPHALIC FISTULA SUPERFICIALIZATION;  Surgeon: Mal Misty, MD;  Location: [place];  Service: Vascular;  Laterality: Left;  from [place] to [facility]  . STUMP REVISION Right 05/24/2018   Procedure: REVISION RIGHT TRANSMETATARSAL AMPUTATION WITH EXCISION BONE/SOFT TISSUE, LOCAL TISSUE  RE-ARRANGEMENT  AND VAC PLACEMENT;  Surgeon: [name], MD;  Location: [facility];  Service: Orthopedics;  Laterality: Right;    Social History: Social History   Tobacco Use  . Smoking status: Never Smoker  . Smokeless tobacco: Never Used  Vaping Use  . Vaping Use: Never used  Substance Use Topics  . Alcohol use: No    Alcohol/week: 0.0 standard drinks  . Drug use: No   Additional social history: None  Please also refer to relevant sections of EMR.  Family History: Family History  Problem Relation Age of Onset  . Heart disease Mother   . Heart disease Father      Allergies and Medications: No Known Allergies No current facility-administered medications on file prior to encounter.   Current Outpatient Medications on File Prior to Encounter  Medication Sig Dispense Refill  . acetaminophen (TYLENOL) 500 MG  tablet Take 1,000 mg by mouth every 6 (six) hours as needed for pain.    Marland Kitchen aspirin EC 81 MG tablet Take 1 tablet (81 mg total) by mouth daily. 90 tablet 3  . atorvastatin (LIPITOR) 80 MG tablet TAKE 1 TABLET(80 MG) BY MOUTH AT BEDTIME (Patient taking differently: Take 80 mg by mouth at bedtime. ) 90 tablet 3  . Blood Glucose Monitoring Suppl (ONE TOUCH ULTRA 2) w/Device KIT 1 kit by Does not apply route 3 (three) times daily. ICD-10 code: E11.40 1 each 0  . cadexomer iodine (IODOSORB) 0.9 % gel Apply to left 5th digit wounds once daily 40 g 0  . carvedilol (COREG) 12.5 MG tablet Take 12.5 mg by mouth 2 (two) times daily.    . cetirizine (  ZYRTEC) 10 MG tablet Take by mouth.    . cyclobenzaprine (FLEXERIL) 10 MG tablet Take 1 tablet (10 mg total) by mouth 3 times/day as needed-between meals & bedtime for muscle spasms. 30 tablet 0  . Etelcalcetide HCl (PARSABIV IV) Etelcalcetide (Parsabiv)    . ferric citrate (AURYXIA) 1 GM 210 MG(Fe) tablet Take 2 tablets (420 mg total) by mouth 3 (three) times daily with meals. Give two (420 mg) by mouth three times a day wih meals for ESRD (Patient taking differently: Take 210-420 mg by mouth See admin instructions. Take 420 mg with each meal and 210 mg with snacks) 180 tablet 0  . fluticasone (FLONASE) 50 MCG/ACT nasal spray Place 1 spray into both nostrils daily. 16 g 5  . glucose blood (ONE TOUCH ULTRA TEST) test strip 1 each by Other route 3 (three) times daily. ICD-10 code: E11.40. 100 each 0  . hydrALAZINE (APRESOLINE) 25 MG tablet Take 25 mg by mouth 2 (two) times a day.    . hydrOXYzine (ATARAX/VISTARIL) 25 MG tablet Take 25 mg by mouth 2 (two) times daily as needed for anxiety.     . insulin detemir (LEVEMIR) 100 UNIT/ML FlexPen 50U in the am and 40U in pm 15 mL 11  . Insulin Pen Needle (B-D UF III MINI PEN NEEDLES) 31G X 5 MM MISC USE AS DIRECTED TWICE DAILY 200 each 0  . Insulin Pen Needle 29G X 12MM MISC Inject 1 pen into the skin 2 (two) times daily.  Check blood sugar daily. 100 each 0  . Insulin Syringe-Needle U-100 (ULTICARE INSULIN SYRINGE) 30G X 5/16" 1 ML MISC USE AS DIRECTED 100 each 0  . Methoxy PEG-Epoetin Beta (MIRCERA IJ) Mircera    . multivitamin (RENA-VIT) TABS tablet Take 1 tablet by mouth daily.    . nitroGLYCERIN (NITRODUR - DOSED IN MG/24 HR) 0.2 mg/hr patch Place 1 patch (0.2 mg total) onto the skin daily. 30 patch 12  . OneTouch Delica Lancets 49S MISC 1 Device by Does not apply route as needed (to check blood glucose). 100 each 0  . pentoxifylline (TRENTAL) 400 MG CR tablet Take 1 tablet (400 mg total) by mouth 3 (three) times daily with meals. 90 tablet 1  . sucroferric oxyhydroxide (VELPHORO) 500 MG chewable tablet Chew by mouth.    . VELPHORO 500 MG chewable tablet Chew 1,000 mg by mouth 3 (three) times daily.    . [DISCONTINUED] progesterone (PROMETRIUM) 200 MG capsule Take 1 capsule (200 mg total) by mouth daily. Take for 10 days if bleeding occurs for more than 4 days. 10 capsule 1    Objective: BP (!) 147/61   Pulse 92   Temp 98.1 F (36.7 C) (Oral)   Resp 14   Ht _0  (1.626 m)   Wt 104.5 kg   LMP 09/18/2011   SpO2 100%   BMI 39.54 kg/m  Exam: General: Obese, resting in bed. Pt is drooling, appears obtunded. Rt foot toes amputated.  Eyes and ENT : Unable to asess secondary to pt participation Neck: Trachea midline Cardiovascular: Cardiac Murmur present Respiratory: B/L decreased breath sounds.  Gastrointestinal: Non distended Derm: No rash Extremities Rt foot toes amputated.  Neuro: Oriented to place but not to time. Psych: Unable to asess secondary to pt participation  Labs and Imaging: CBC BMET  Recent Labs  Lab 11/11/19 1822  WBC 22.9*  HGB 12.7  HCT 41.8  PLT 274   Recent Labs  Lab 11/11/19 1822  NA 136  K 3.8  CL 90*  CO2 27  BUN 39*  CREATININE 9.64*  GLUCOSE 308*  CALCIUM 9.0     EKG: shows sinus rhythm, Right Axis deviation Qtc- 485  Daisy Floro, DO 11/12/2019,  12:59 AM PGY-3, [place] [facility] pager: [phone], text pages welcome

## 2019-11-11 NOTE — ED Notes (Signed)
Pt transported to CT ?

## 2019-11-11 NOTE — Progress Notes (Deleted)
vLTM started    Neurology notified.   Event button tested.  [facility] to monitoring   Same leads used for baseline EEG

## 2019-11-11 NOTE — Progress Notes (Signed)
Pharmacy Antibiotic Note  Rachel Chaney is a 59 y.o. female admitted on 11/11/2019 with pneumonia.  Pharmacy has been consulted for vancomycin dosing. Pt is a MWF HD patient.   Plan: Vancomycin 2000 mg IV x1 Vancomycin 1000 mg IV MWF after HD Monitor clinical progress, HD plans  Height: 5\' 4"  (162.6 cm) Weight: 104.5 kg (230 lb 6.1 oz) IBW/kg (Calculated) : 54.7  Temp (24hrs), Avg:98.1 F (36.7 C), Min:98.1 F (36.7 C), Max:98.1 F (36.7 C)  Recent Labs  Lab 11/11/19 1822  WBC 22.9*  CREATININE 9.64*    Estimated Creatinine Clearance: 7.3 mL/min (A) (by C-G formula based on SCr of 9.64 mg/dL (H)).    No Known Allergies  Antimicrobials this admission: Cefepime 11/2 x1 Vancomycin 11/2 >>   Microbiology results: N/A  Thank you for allowing pharmacy to be a part of this patient's care.  Rebbeca Paul, PharmD PGY1 Pharmacy Resident 11/11/2019 8:20 PM  Please check [email or web address] for unit-specific pharmacy phone numbers.

## 2019-11-11 NOTE — ED Triage Notes (Signed)
Pt bib EMS with reports of seizure activity at around 1720. Pt suddenly started jerking in seizure like motions and husband called EMS. Pt has never had seizures before. Very heavy vomiting post-ictally. Posturing activity reported during ambulance ride to hospital at around 1745.  GCS of 12. Pt SPO2 in the 70s on room air, placed on NRB which brought SPO2 into the 90's  Hx of diabetes and dialysis pt MWF

## 2019-11-11 NOTE — ED Notes (Signed)
Pt weaned off of NRB 15L, placed on 4L [place]. SPO2 saturation maintaining at 93%

## 2019-11-11 NOTE — Consult Note (Signed)
NEURO HOSPITALIST CONSULT NOTE   Requestig physician: Dr. Roslynn Amble  Reason for Consult: First-time seizure  History obtained from:  Chart    HPI:                                                                                                                                          Rachel Chaney is an 59 y.o. female with a PMHx of cardiac arrest, CHF, ESRD on HD, DM2, HLD, HTN and neuropathy, who presents to the [facility] ED with new-onset seizure activity. At about 1720 the patient was noted to suddenly start jerking with seizure-like motions. Husband called EMS. Seizure activity lasted for about 10 minutes. There was very heavy vomiting post-ictally. Posturing versus upper extremity shaking activity was reported by EMS en route, at about 1745, but the patient was responsive.   On arrival to the ED the patient was postictal with a GCS of 12. SpO2 in the 70's on room air. The patient was placed on NRB which resulted in ne ormalization of SpO2 to 90's. She was then weaned off NRB and placed on 4 L [place] with O2 sats maintained at 93%.   CT head shows no acute intracranial abnormality.   CXR reveals stable bibasilar opacities concerning for pneumonia.  Labs in the ED: Glucose 308, chloride 90, Na and Ca are normal, BUN and Cr are elevated with eGFR of 4, AST and ALT are normal, WBC elevated at 22.9 and is predominantly neutrophilic, EtOH level is < 10.   Rales in her lower lobes and pitting edema were noted on exam by EDP. The patient has been started on vancomycin for PNA.   The patient has no prior history of seizures.   Per EDP documentation, initial ROS was positive for nausea, with comprehensive symptom review otherwise negative, including no fevers/chills, abdominal pain, cough or CP.   On attempt to obtain ROS by Neurologist, the patient is too confused to provide reliable answers.  Past Medical History:  Diagnosis Date  . Cardiac arrest ([place]) 02/28/2018   while on  hemodialysis in hospital  . CHF (congestive heart failure) ([place])   . Chronic kidney disease due to diabetes mellitus ([place]) 10/13/2013  . Diabetes mellitus    Type II  . ESRD (end stage renal disease) ([place])    MWF - [facility]  . Hyperlipidemia   . Hypertension   . Neuropathy     Past Surgical History:  Procedure Laterality Date  . AMPUTATION Right 02/27/2018   Procedure: RIGHT FOOT 2ND AND 3RD RAY AMPUTATION;  Surgeon: [name], MD;  Location: [place];  Service: Orthopedics;  Laterality: Right;  . AMPUTATION Right 04/24/2018   Procedure: RIGHT TRANSMETATARSAL AMPUTATION, PLACE VAC;  Surgeon: [name], MD;  Location: [facility]  OR;  Service: Orthopedics;  Laterality: Right;  . AV FISTULA PLACEMENT Left 04/15/2015   Procedure: ARTERIOVENOUS (AV) FISTULA CREATION;  Surgeon: Mal Misty, MD;  Location: [place];  Service: Vascular;  Laterality: Left;  . EYE SURGERY Right    retinal detachment  . FISTULA SUPERFICIALIZATION Left 06/03/2015   Procedure: LEFT UPPER ARM BRACHIOCEPHALIC FISTULA SUPERFICIALIZATION;  Surgeon: Mal Misty, MD;  Location: [place];  Service: Vascular;  Laterality: Left;  from [place] to General  . STUMP REVISION Right 05/24/2018   Procedure: REVISION RIGHT TRANSMETATARSAL AMPUTATION WITH EXCISION BONE/SOFT TISSUE, LOCAL TISSUE  RE-ARRANGEMENT  AND VAC PLACEMENT;  Surgeon: [name], MD;  Location: [place];  Service: Orthopedics;  Laterality: Right;    Family History  Problem Relation Age of Onset  . Heart disease Mother   . Heart disease Father               Social History:  reports that she has never smoked. She has never used smokeless tobacco. She reports that she does not drink alcohol and does not use drugs.  No Known Allergies  MEDICATIONS:                                                                                                                     No current facility-administered medications on file prior to encounter.   Current Outpatient  Medications on File Prior to Encounter  Medication Sig Dispense Refill  . acetaminophen (TYLENOL) 500 MG tablet Take 1,000 mg by mouth every 6 (six) hours as needed for pain.    Marland Kitchen aspirin EC 81 MG tablet Take 1 tablet (81 mg total) by mouth daily. 90 tablet 3  . atorvastatin (LIPITOR) 80 MG tablet TAKE 1 TABLET(80 MG) BY MOUTH AT BEDTIME 90 tablet 3  . Blood Glucose Monitoring Suppl (ONE TOUCH ULTRA 2) w/Device KIT 1 kit by Does not apply route 3 (three) times daily. ICD-10 code: E11.40 1 each 0  . cadexomer iodine (IODOSORB) 0.9 % gel Apply to left 5th digit wounds once daily 40 g 0  . carvedilol (COREG) 25 MG tablet TAKE 1 TABLET(25 MG) BY MOUTH TWICE DAILY 180 tablet 1  . cetirizine (ZYRTEC) 10 MG tablet Take by mouth.    . cyclobenzaprine (FLEXERIL) 10 MG tablet Take 1 tablet (10 mg total) by mouth 3 times/day as needed-between meals & bedtime for muscle spasms. 30 tablet 0  . Etelcalcetide HCl (PARSABIV IV) Etelcalcetide (Parsabiv)    . ferric citrate (AURYXIA) 1 GM 210 MG(Fe) tablet Take 2 tablets (420 mg total) by mouth 3 (three) times daily with meals. Give two (420 mg) by mouth three times a day wih meals for ESRD (Patient taking differently: Take 210-420 mg by mouth See admin instructions. Take 420 mg with each meal and 210 mg with snacks) 180 tablet 0  . fluticasone (FLONASE) 50 MCG/ACT nasal spray Place 1 spray into both nostrils daily. 16 g 5  . glucose blood (ONE TOUCH ULTRA TEST) test  strip 1 each by Other route 3 (three) times daily. ICD-10 code: E11.40. 100 each 0  . hydrALAZINE (APRESOLINE) 25 MG tablet Take 25 mg by mouth 2 (two) times a day.    Marland Kitchen HYDROcodone-acetaminophen (NORCO) 5-325 MG tablet Take 1 tablet by mouth every 6 (six) hours as needed for moderate pain. 30 tablet 0  . hydrOXYzine (ATARAX/VISTARIL) 50 MG tablet Take by mouth.    . insulin detemir (LEVEMIR) 100 UNIT/ML FlexPen 50U in the am and 40U in pm 15 mL 11  . Insulin Pen Needle (B-D UF III MINI PEN NEEDLES) 31G  X 5 MM MISC USE AS DIRECTED TWICE DAILY 200 each 0  . Insulin Pen Needle 29G X 12MM MISC Inject 1 pen into the skin 2 (two) times daily. Check blood sugar daily. 100 each 0  . Insulin Syringe-Needle U-100 (ULTICARE INSULIN SYRINGE) 30G X 5/16" 1 ML MISC USE AS DIRECTED 100 each 0  . Methoxy PEG-Epoetin Beta (MIRCERA IJ) Mircera    . multivitamin (RENA-VIT) TABS tablet Take 1 tablet by mouth daily.    . nitroGLYCERIN (NITRODUR - DOSED IN MG/24 HR) 0.2 mg/hr patch Place 1 patch (0.2 mg total) onto the skin daily. 30 patch 12  . OneTouch Delica Lancets 43I MISC 1 Device by Does not apply route as needed (to check blood glucose). 100 each 0  . pentoxifylline (TRENTAL) 400 MG CR tablet Take 1 tablet (400 mg total) by mouth 3 (three) times daily with meals. 90 tablet 1  . sucroferric oxyhydroxide (VELPHORO) 500 MG chewable tablet Chew by mouth.    . VELPHORO 500 MG chewable tablet Chew 1,000 mg by mouth 3 (three) times daily.    . [DISCONTINUED] progesterone (PROMETRIUM) 200 MG capsule Take 1 capsule (200 mg total) by mouth daily. Take for 10 days if bleeding occurs for more than 4 days. 10 capsule 1    Scheduled:  Continuous: . [START ON 11/12/2019] vancomycin    . vancomycin       ROS:                                                                                                                                       As per HPI.     Blood pressure (!) 109/59, pulse 91, temperature 98.1 F (36.7 C), temperature source Oral, resp. rate 14, height _0  (1.626 m), weight 104.5 kg, last menstrual period 09/18/2011, SpO2 99 %.   General Examination:  Physical Exam  HEENT-  [place]/AT   Lungs- Respirations unlabored Extremities- Stasis dermatitis changes to distal BLE. 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   Neurological Examination Mental Status: Drowsy and encephalopathic. Speech is  intermittently dysarthric. Able to correctly state the year after an initial mistake. Perseverates when asked the month. Does not know the day. Can correctly identify the city and state. Often perseverates verbally. Was able to identify the examiner's hand. Could not consistently count fingers. Repetition impaired. Could follow only some commands in the context of waxing/waning LOC. Speech was halting at times and at other times fluent in the context of sparse verbal output.  Cranial Nerves: II: Corneal opacity on the right with chronic appearing defect in right iris and small irregular pupil. Difficult to assess pupillary reactivity on the right due to patient movement. Left pupil is 3 mm and reactive. Can identify objects and fixate with left eye, but perseverates/confabulates when asked to identify objects with right eye. Does not fixate consistently with right eye. Patient not cooperative with visual field testing; grossly does not consistently blink to threat with either eye.   III,IV, VI: No ptosis. No nystagmus. Exotropic. Will briefly gaze to right and left when tracking.  V,VII: Reacts to tactile stimuli bilaterally. Face grossly symmetric, but would not smile to command.  VIII: hearing intact to voice IX,X: No hoarseness or hypophonia XI: Unable to assess, other than noting head to be midline.  XII: Does not protrude tongue to command Motor: Will move all 4 extremities with equal strength, but not consistently following commands for formal testing.  Occasionally, spontaneous antigravity movements of upper extremities by the patient are associated with asterixis Sensory: Reacts to tactile stimuli BUE and to plantar stimulation BLE Deep Tendon Reflexes: Hypoactive throughout Plantars: Unable to assess on right. Mute on left.  Cerebellar: Not cooperative with exam.  Gait: Unable to assess   Lab Results: Basic Metabolic Panel: Recent Labs  Lab 11/11/19 1822  NA 136  K 3.8  CL 90*  CO2  27  GLUCOSE 308*  BUN 39*  CREATININE 9.64*  CALCIUM 9.0    CBC: Recent Labs  Lab 11/11/19 1822  WBC 22.9*  NEUTROABS 18.8*  HGB 12.7  HCT 41.8  MCV 110.3*  PLT 274    Cardiac Enzymes: No results for input(s): CKTOTAL, CKMB, CKMBINDEX, TROPONINI in the last 168 hours.  Lipid Panel: No results for input(s): CHOL, TRIG, HDL, CHOLHDL, VLDL, LDLCALC in the last 168 hours.  Imaging: DG Chest 2 View  Result Date: 11/11/2019 CLINICAL DATA:  Hypoxia. EXAM: CHEST - 2 VIEW COMPARISON:  March 10, 2018. FINDINGS: Stable cardiomegaly. No pneumothorax is noted. Stable bibasilar opacities are noted concerning for pneumonia. No significant pleural effusion is noted. Bony thorax is unremarkable. IMPRESSION: Stable bibasilar opacities concerning for pneumonia. Aortic Atherosclerosis (ICD10-I70.0). Electronically Signed   By: Marijo Conception M.D.   On: 11/11/2019 18:39   CT HEAD WO CONTRAST  Result Date: 11/11/2019 CLINICAL DATA:  Seizure activity, vomiting, postictal EXAM: CT HEAD WITHOUT CONTRAST TECHNIQUE: Contiguous axial images were obtained from the base of the skull through the vertex without intravenous contrast. COMPARISON:  03/06/2018 FINDINGS: Brain: No acute infarct or hemorrhage. Lateral ventricles and midline structures are stable. Stable scattered hypodensities in the periventricular white matter consistent with chronic small vessel ischemic change. No acute extra-axial fluid collections. No mass effect. Vascular: No hyperdense vessel or unexpected calcification. Skull: Normal. Negative for fracture or focal lesion. Sinuses/Orbits: No acute finding.  Other: None. IMPRESSION: 1. Stable exam, no acute process. Electronically Signed   By: Randa Ngo M.D.   On: 11/11/2019 19:37    Assessment: 59 year old female presenting with first-time seizure.  1. Exam reveals an encephalopathic patient with asterixis. Most likely etiology is a combination of metabolic (hypercarbia) and infectious  encephalopathy (PNA) in conjunction with postictal state.  2. CT head: No acute abnormality. Stable scattered hypodensities in the periventricular white matter are relatively subtle and appear most consistent with chronic small vessel ischemic changes.   Recommendations: 1. EEG (ordered) 2. As the seizure was most likely provoked, secondary to infection and metabolic derangement, anticonvulsant has not been administered in the ED. She is now on antibiotics for her PNA. Would continue to hold off on anticonvulsant unless EEG shows an indication for starting.  3. MRI brain when able. Cannot use contrast due to her ESRD.    Electronically signed: Dr. Kerney Elbe 11/11/2019, 9:48 PM

## 2019-11-12 ENCOUNTER — Inpatient Hospital Stay (HOSPITAL_COMMUNITY): Payer: Medicare Other

## 2019-11-12 ENCOUNTER — Encounter (HOSPITAL_COMMUNITY): Payer: Self-pay | Admitting: Critical Care Medicine

## 2019-11-12 ENCOUNTER — Other Ambulatory Visit: Payer: Self-pay | Admitting: Family Medicine

## 2019-11-12 DIAGNOSIS — G9340 Encephalopathy, unspecified: Secondary | ICD-10-CM

## 2019-11-12 DIAGNOSIS — R569 Unspecified convulsions: Secondary | ICD-10-CM | POA: Insufficient documentation

## 2019-11-12 DIAGNOSIS — J9601 Acute respiratory failure with hypoxia: Secondary | ICD-10-CM

## 2019-11-12 DIAGNOSIS — Z992 Dependence on renal dialysis: Secondary | ICD-10-CM

## 2019-11-12 DIAGNOSIS — J189 Pneumonia, unspecified organism: Secondary | ICD-10-CM | POA: Diagnosis not present

## 2019-11-12 DIAGNOSIS — J9691 Respiratory failure, unspecified with hypoxia: Secondary | ICD-10-CM | POA: Diagnosis present

## 2019-11-12 DIAGNOSIS — N186 End stage renal disease: Secondary | ICD-10-CM

## 2019-11-12 DIAGNOSIS — G4733 Obstructive sleep apnea (adult) (pediatric): Secondary | ICD-10-CM | POA: Diagnosis present

## 2019-11-12 DIAGNOSIS — J181 Lobar pneumonia, unspecified organism: Secondary | ICD-10-CM

## 2019-11-12 DIAGNOSIS — J9602 Acute respiratory failure with hypercapnia: Secondary | ICD-10-CM

## 2019-11-12 DIAGNOSIS — J9692 Respiratory failure, unspecified with hypercapnia: Secondary | ICD-10-CM | POA: Diagnosis present

## 2019-11-12 LAB — GLUCOSE, CAPILLARY
Glucose-Capillary: 128 mg/dL — ABNORMAL HIGH (ref 70–99)
Glucose-Capillary: 85 mg/dL (ref 70–99)
Glucose-Capillary: 78 mg/dL (ref 70–99)
Glucose-Capillary: 87 mg/dL (ref 70–99)

## 2019-11-12 LAB — MRSA PCR SCREENING: MRSA by PCR: NEGATIVE

## 2019-11-12 LAB — RENAL FUNCTION PANEL
Albumin: 2.7 g/dL — ABNORMAL LOW (ref 3.5–5.0)
Anion gap: 22 — ABNORMAL HIGH (ref 5–15)
BUN: 48 mg/dL — ABNORMAL HIGH (ref 6–20)
CO2: 23 mmol/L (ref 22–32)
Calcium: 8.5 mg/dL — ABNORMAL LOW (ref 8.9–10.3)
Chloride: 93 mmol/L — ABNORMAL LOW (ref 98–111)
Creatinine, Ser: 10.4 mg/dL — ABNORMAL HIGH (ref 0.44–1.00)
GFR, Estimated: 4 mL/min — ABNORMAL LOW (ref >60)
Glucose, Bld: 75 mg/dL (ref 70–99)
Phosphorus: 11.5 mg/dL — ABNORMAL HIGH (ref 2.5–4.6)
Potassium: 4.2 mmol/L (ref 3.5–5.1)
Sodium: 138 mmol/L (ref 135–145)

## 2019-11-12 LAB — I-STAT ARTERIAL BLOOD GAS, ED
Acid-Base Excess: 1 mmol/L (ref 0.0–2.0)
Acid-Base Excess: 2 mmol/L (ref 0.0–2.0)
Acid-Base Excess: 3 mmol/L — ABNORMAL HIGH (ref 0.0–2.0)
Bicarbonate: 31.6 mmol/L — ABNORMAL HIGH (ref 20.0–28.0)
Bicarbonate: 31.6 mmol/L — ABNORMAL HIGH (ref 20.0–28.0)
Bicarbonate: 26.8 mmol/L (ref 20.0–28.0)
Calcium, Ion: 1.15 mmol/L (ref 1.15–1.40)
Calcium, Ion: 1.15 mmol/L (ref 1.15–1.40)
Calcium, Ion: 1.08 mmol/L — ABNORMAL LOW (ref 1.15–1.40)
HCT: 39 % (ref 36.0–46.0)
HCT: 41 % (ref 36.0–46.0)
HCT: 40 % (ref 36.0–46.0)
Hemoglobin: 13.3 g/dL (ref 12.0–15.0)
Hemoglobin: 13.9 g/dL (ref 12.0–15.0)
Hemoglobin: 13.6 g/dL (ref 12.0–15.0)
O2 Saturation: 96 %
O2 Saturation: 97 %
O2 Saturation: 97 %
Patient temperature: 98.1
Potassium: 4.3 mmol/L (ref 3.5–5.1)
Potassium: 4.3 mmol/L (ref 3.5–5.1)
Potassium: 4.2 mmol/L (ref 3.5–5.1)
Sodium: 133 mmol/L — ABNORMAL LOW (ref 135–145)
Sodium: 137 mmol/L (ref 135–145)
Sodium: 136 mmol/L (ref 135–145)
TCO2: 34 mmol/L — ABNORMAL HIGH (ref 22–32)
TCO2: 34 mmol/L — ABNORMAL HIGH (ref 22–32)
TCO2: 28 mmol/L (ref 22–32)
pCO2 arterial: 81.7 mmHg (ref 32.0–48.0)
pCO2 arterial: 76 mmHg (ref 32.0–48.0)
pCO2 arterial: 38.9 mmHg (ref 32.0–48.0)
pH, Arterial: 7.194 — CL (ref 7.350–7.450)
pH, Arterial: 7.226 — ABNORMAL LOW (ref 7.350–7.450)
pH, Arterial: 7.447 (ref 7.350–7.450)
pO2, Arterial: 106 mmHg (ref 83.0–108.0)
pO2, Arterial: 117 mmHg — ABNORMAL HIGH (ref 83.0–108.0)
pO2, Arterial: 91 mmHg (ref 83.0–108.0)

## 2019-11-12 LAB — CBC
HCT: 43.5 % (ref 36.0–46.0)
HCT: 36.2 % (ref 36.0–46.0)
Hemoglobin: 12.8 g/dL (ref 12.0–15.0)
Hemoglobin: 11.1 g/dL — ABNORMAL LOW (ref 12.0–15.0)
MCH: 33.4 pg (ref 26.0–34.0)
MCH: 33.4 pg (ref 26.0–34.0)
MCHC: 29.4 g/dL — ABNORMAL LOW (ref 30.0–36.0)
MCHC: 30.7 g/dL (ref 30.0–36.0)
MCV: 113.6 fL — ABNORMAL HIGH (ref 80.0–100.0)
MCV: 109 fL — ABNORMAL HIGH (ref 80.0–100.0)
Platelets: 244 10*3/uL (ref 150–400)
Platelets: 217 10*3/uL (ref 150–400)
RBC: 3.83 MIL/uL — ABNORMAL LOW (ref 3.87–5.11)
RBC: 3.32 MIL/uL — ABNORMAL LOW (ref 3.87–5.11)
RDW: 17.9 % — ABNORMAL HIGH (ref 11.5–15.5)
RDW: 17.3 % — ABNORMAL HIGH (ref 11.5–15.5)
WBC: 38.3 10*3/uL — ABNORMAL HIGH (ref 4.0–10.5)
WBC: 27.9 10*3/uL — ABNORMAL HIGH (ref 4.0–10.5)
nRBC: 0.3 % — ABNORMAL HIGH (ref 0.0–0.2)
nRBC: 0.2 % (ref 0.0–0.2)

## 2019-11-12 LAB — MAGNESIUM: Magnesium: 2 mg/dL (ref 1.7–2.4)

## 2019-11-12 LAB — BLOOD GAS, ARTERIAL
Acid-base deficit: 2.1 mmol/L — ABNORMAL HIGH (ref 0.0–2.0)
Bicarbonate: 27.7 mmol/L (ref 20.0–28.0)
FIO2: 21
O2 Saturation: 88.3 %
Patient temperature: 37
pCO2 arterial: 106 mmHg (ref 32.0–48.0)
pH, Arterial: 7.044 — CL (ref 7.350–7.450)
pO2, Arterial: 83.7 mmHg (ref 83.0–108.0)

## 2019-11-12 LAB — COMPREHENSIVE METABOLIC PANEL
ALT: 24 U/L (ref 0–44)
AST: 23 U/L (ref 15–41)
Albumin: 3 g/dL — ABNORMAL LOW (ref 3.5–5.0)
Alkaline Phosphatase: 89 U/L (ref 38–126)
Anion gap: 21 — ABNORMAL HIGH (ref 5–15)
BUN: 46 mg/dL — ABNORMAL HIGH (ref 6–20)
CO2: 22 mmol/L (ref 22–32)
Calcium: 8.7 mg/dL — ABNORMAL LOW (ref 8.9–10.3)
Chloride: 92 mmol/L — ABNORMAL LOW (ref 98–111)
Creatinine, Ser: 9.79 mg/dL — ABNORMAL HIGH (ref 0.44–1.00)
GFR, Estimated: 4 mL/min — ABNORMAL LOW (ref >60)
Glucose, Bld: 271 mg/dL — ABNORMAL HIGH (ref 70–99)
Potassium: 4.8 mmol/L (ref 3.5–5.1)
Sodium: 135 mmol/L (ref 135–145)
Total Bilirubin: 1.2 mg/dL (ref 0.3–1.2)
Total Protein: 7.1 g/dL (ref 6.5–8.1)

## 2019-11-12 LAB — LIPID PANEL
Cholesterol: 79 mg/dL (ref 0–200)
HDL: 31 mg/dL — ABNORMAL LOW (ref >40)
LDL Cholesterol: 35 mg/dL (ref 0–99)
Total CHOL/HDL Ratio: 2.5 RATIO
Triglycerides: 65 mg/dL (ref <150)
VLDL: 13 mg/dL (ref 0–40)

## 2019-11-12 LAB — AMMONIA: Ammonia: 45 umol/L — ABNORMAL HIGH (ref 9–35)

## 2019-11-12 LAB — RETICULOCYTES
Immature Retic Fract: 31.8 % — ABNORMAL HIGH (ref 2.3–15.9)
RBC.: 3.72 MIL/uL — ABNORMAL LOW (ref 3.87–5.11)
Retic Count, Absolute: 178.6 10*3/uL (ref 19.0–186.0)
Retic Ct Pct: 4.8 % — ABNORMAL HIGH (ref 0.4–3.1)

## 2019-11-12 LAB — LACTIC ACID, PLASMA
Lactic Acid, Venous: 2.3 mmol/L (ref 0.5–1.9)
Lactic Acid, Venous: 0.9 mmol/L (ref 0.5–1.9)

## 2019-11-12 LAB — IRON AND TIBC
Iron: 41 ug/dL (ref 28–170)
Saturation Ratios: 16 % (ref 10.4–31.8)
TIBC: 252 ug/dL (ref 250–450)
UIBC: 211 ug/dL

## 2019-11-12 LAB — HIV ANTIBODY (ROUTINE TESTING W REFLEX): HIV Screen 4th Generation wRfx: NONREACTIVE

## 2019-11-12 LAB — HEMOGLOBIN A1C
Hgb A1c MFr Bld: 9 % — ABNORMAL HIGH (ref 4.8–5.6)
Mean Plasma Glucose: 211.6 mg/dL

## 2019-11-12 LAB — VITAMIN B12: Vitamin B-12: 960 pg/mL — ABNORMAL HIGH (ref 180–914)

## 2019-11-12 LAB — FOLATE: Folate: 21.3 ng/mL (ref >5.9)

## 2019-11-12 LAB — CBG MONITORING, ED: Glucose-Capillary: 220 mg/dL — ABNORMAL HIGH (ref 70–99)

## 2019-11-12 LAB — TSH: TSH: 2.27 u[IU]/mL (ref 0.350–4.500)

## 2019-11-12 LAB — PROCALCITONIN: Procalcitonin: 2.9 ng/mL

## 2019-11-12 LAB — BRAIN NATRIURETIC PEPTIDE: B Natriuretic Peptide: 1237.2 pg/mL — ABNORMAL HIGH (ref 0.0–100.0)

## 2019-11-12 LAB — FERRITIN: Ferritin: 977 ng/mL — ABNORMAL HIGH (ref 11–307)

## 2019-11-12 MED ORDER — CINACALCET HCL 30 MG PO TABS
30.0000 mg | ORAL_TABLET | ORAL | Status: DC
  Administered 2019-11-14: 30 mg via ORAL
  Filled 2019-11-12 (×2): qty 1

## 2019-11-12 MED ORDER — HEPARIN SODIUM (PORCINE) 1000 UNIT/ML IJ SOLN
INTRAMUSCULAR | Status: AC
  Administered 2019-11-12: 4000 [IU]
  Filled 2019-11-12: qty 8

## 2019-11-12 MED ORDER — DEXTROSE 10 % IV SOLN: INTRAVENOUS | Status: DC

## 2019-11-12 MED ORDER — CHLORHEXIDINE GLUCONATE CLOTH 2 % EX PADS: 6.0000 | MEDICATED_PAD | Freq: Every day | CUTANEOUS | Status: DC

## 2019-11-12 MED ORDER — HEPARIN SODIUM (PORCINE) 1000 UNIT/ML DIALYSIS
4000.0000 [IU] | INTRAMUSCULAR | Status: DC | PRN
  Filled 2019-11-12 (×2): qty 4

## 2019-11-12 MED ORDER — DOCUSATE SODIUM 100 MG PO CAPS: 100.0000 mg | ORAL_CAPSULE | Freq: Two times a day (BID) | ORAL | Status: DC | PRN

## 2019-11-12 MED ORDER — ORAL CARE MOUTH RINSE
15.0000 mL | Freq: Two times a day (BID) | OROMUCOSAL | Status: DC
  Administered 2019-11-12 (×2): 15 mL via OROMUCOSAL

## 2019-11-12 MED ORDER — ACETAMINOPHEN 325 MG PO TABS
650.0000 mg | ORAL_TABLET | Freq: Four times a day (QID) | ORAL | Status: DC | PRN
  Administered 2019-11-12 – 2019-11-17 (×7): 650 mg via ORAL
  Filled 2019-11-12 (×8): qty 2

## 2019-11-12 MED ORDER — ALBUMIN HUMAN 25 % IV SOLN: 25.0000 g | Freq: Once | INTRAVENOUS | Status: AC | PRN

## 2019-11-12 MED ORDER — CHLORHEXIDINE GLUCONATE CLOTH 2 % EX PADS
6.0000 | MEDICATED_PAD | Freq: Every day | CUTANEOUS | Status: DC
  Administered 2019-11-12: 6 via TOPICAL

## 2019-11-12 MED ORDER — ALBUMIN HUMAN 25 % IV SOLN
25.0000 g | Freq: Once | INTRAVENOUS | Status: AC | PRN
  Administered 2019-11-12: 25 g via INTRAVENOUS

## 2019-11-12 MED ORDER — SODIUM CHLORIDE 0.9 % IV SOLN: 500.0000 mg | INTRAVENOUS | Status: DC

## 2019-11-12 MED ORDER — NALOXONE HCL 0.4 MG/ML IJ SOLN
0.4000 mg | INTRAMUSCULAR | Status: DC | PRN
  Administered 2019-11-12: 0.4 mg via INTRAVENOUS
  Filled 2019-11-12: qty 1

## 2019-11-12 MED ORDER — SUCROFERRIC OXYHYDROXIDE 500 MG PO CHEW
1000.0000 mg | CHEWABLE_TABLET | Freq: Three times a day (TID) | ORAL | Status: DC
  Administered 2019-11-13 – 2019-11-14 (×4): 1000 mg via ORAL
  Filled 2019-11-12 (×20): qty 2

## 2019-11-12 MED ORDER — ASPIRIN EC 81 MG PO TBEC
81.0000 mg | DELAYED_RELEASE_TABLET | Freq: Every day | ORAL | Status: DC
  Administered 2019-11-12 – 2019-11-17 (×5): 81 mg via ORAL
  Filled 2019-11-12 (×5): qty 1

## 2019-11-12 MED ORDER — CHLORHEXIDINE GLUCONATE 0.12 % MT SOLN
15.0000 mL | Freq: Two times a day (BID) | OROMUCOSAL | Status: DC
  Administered 2019-11-12 – 2019-11-13 (×2): 15 mL via OROMUCOSAL
  Filled 2019-11-12: qty 15

## 2019-11-12 MED ORDER — CHLORHEXIDINE GLUCONATE CLOTH 2 % EX PADS
6.0000 | MEDICATED_PAD | Freq: Every day | CUTANEOUS | Status: DC
  Administered 2019-11-13 – 2019-11-17 (×3): 6 via TOPICAL

## 2019-11-12 MED ORDER — POLYETHYLENE GLYCOL 3350 17 G PO PACK: 17.0000 g | PACK | Freq: Every day | ORAL | Status: DC | PRN

## 2019-11-12 MED ORDER — SODIUM CHLORIDE 0.9 % IV SOLN
2.0000 g | INTRAVENOUS | Status: DC
  Administered 2019-11-12: 2 g via INTRAVENOUS
  Filled 2019-11-12 (×2): qty 2

## 2019-11-12 MED ORDER — SODIUM CHLORIDE 0.9 % IV SOLN
500.0000 mg | INTRAVENOUS | Status: DC
  Administered 2019-11-12: 500 mg via INTRAVENOUS
  Filled 2019-11-12 (×2): qty 500

## 2019-11-12 MED ORDER — ALBUMIN HUMAN 25 % IV SOLN
INTRAVENOUS | Status: AC
  Administered 2019-11-12: 25 g via INTRAVENOUS
  Filled 2019-11-12: qty 200

## 2019-11-12 MED ORDER — DOXERCALCIFEROL 4 MCG/2ML IV SOLN
11.0000 ug | INTRAVENOUS | Status: DC
  Filled 2019-11-12 (×3): qty 6

## 2019-11-12 MED ORDER — FERRIC CITRATE 1 GM 210 MG(FE) PO TABS
420.0000 mg | ORAL_TABLET | Freq: Three times a day (TID) | ORAL | Status: DC
  Administered 2019-11-13 – 2019-11-17 (×5): 420 mg via ORAL
  Filled 2019-11-12 (×9): qty 2

## 2019-11-12 MED ORDER — DOXERCALCIFEROL 4 MCG/2ML IV SOLN
INTRAVENOUS | Status: AC
  Administered 2019-11-12: 11 ug via INTRAVENOUS
  Filled 2019-11-12: qty 6

## 2019-11-12 NOTE — Consult Note (Signed)
NAMEJANCIE Chaney, MRN:  [id number], DOB:  Apr 24, 1960, LOS: 1 ADMISSION DATE:  11/11/2019, CONSULTATION DATE:  11/12/19 REFERRING MD:  Teaching service, CHIEF COMPLAINT:  AMS   Brief History   59 y.o. F with PMH of CAD, HFpEF, ESRD on dialysis, DM, HTN and HL who presented 11/2 after concern for AMS and seizure activity.  Found to have likely bilateral PNA and hypercapnia.  Rachel Chaney was placed on Bipap and admitted. PCCM consulted for worsening mental status   History of present illness   Rachel Chaney is a 59 y.o. F with PMH CAD, HFpEF, ESRD on dialysis, DM, HTN and HL who presented with AMS and possible seizure activity.  Per her husband, Rachel Chaney has been somnolent in the daytime for several months with chronic cough, but otherwise no fevers, chills, history of seizures, travel, CP, n/v or diarrhea.  Rachel Chaney has not missed any dialysis sessions.  The morning of 11/2 husband says Rachel Chaney was sitting on the cough dozing off as Rachel Chaney often does when Rachel Chaney leaned forward and vomited, slumped back unresponsive and then had generalized shaking for about 10 minutes.  He called EMS who reported multiple episodes of vomiting en route.   In the ED, patient initially had a GCS of 12 and was hypoxic on RA in the 70%'s.  This improved with non-rebreather, no hypotension, fever or tachycardia.   CXR concerning for bibasilar infiltrates and initial WBC 22k, RVP and CT head negative, ABG with hypercapneic respiratory failure.  Neurology felt seizure was likely provoked and recommended EEG and MRI.   Rachel Chaney was admitted to the family medicine teaching service and placed on Bipap.   ABG improving the AM of 11/3 but mental status worsening and patient minimally responsive, so PCCM consulted  Past Medical History   has a past medical history of Cardiac arrest ([place]) (02/28/2018), CHF (congestive heart failure) ([place]), Chronic kidney disease due to diabetes mellitus ([place]) (10/13/2013), Diabetes mellitus, ESRD (end stage renal disease) ([place]),  Hyperlipidemia, Hypertension, and Neuropathy.   Significant Hospital Events   11/2 Admit to teaching service 11/3 worsening mental status, PCCM consult>ICU txfr  Consults:  Nephrology Neurology PCCM  Procedures:    Significant Diagnostic Tests:  11/2 CXR>>Stable bibasilar opacities concerning for pneumonia. 11/2 CT head>>no acute process, scattered hypodensities consistent with chronic small vessel dz  Micro Data:  11/2 covid-19 and flu>>negative 11/3 BCx2>> 11/3 sputum culture>>  Antimicrobials:  Cefepime 11/1 Vancomycin 11/1  Interim history/subjective:  Pt. Hemodynamically stable with hypercapnia resolved on most recent ABG, but minimally responsive, which is a change per nursing.  Admit to ICU, will likely need intubation for airway protection  Objective   Blood pressure (!) 124/56, pulse 71, temperature 98.1 F (36.7 C), temperature source Oral, resp. rate 14, height _0  (1.626 m), weight 104.5 kg, last menstrual period 09/18/2011, SpO2 99 %.        Intake/Output Summary (Last 24 hours) at 11/12/2019 1009 Last data filed at 11/12/2019 0032 Gross per 24 hour  Intake 500 ml  Output 0 ml  Net 500 ml   Filed Weights   11/11/19 2000  Weight: 104.5 kg   General:  Somnolent F, well-nourished on bipap in no distress HEENT: MM pink/moist, sclera anicteric, pupils 69m and reactive Neuro: groans to deep sternal rub, not following commands, later slightly nods to questions, occasional shoulder jerking  CV: s1s2 rrr, no m/r/g PULM:  No rhonchi or wheezing, tolerating bipap mask, decreased breath sounds in bilateral bases GI: soft,  bsx4 active, +BS Extremities: warm/dry, no edema, R partial foot amputaiton Skin: no rashes or lesions   Resolved Hospital Problem list     Assessment & Plan:   Acute Encephalopathy with seizure-like activity Pt with worsening somnolence despite improving hypercapnia, not uremic with negative head CT on admission, no sedating  medications given, no reported drug use -after initial exam, pt had an episode of shaking which initially appeared seizure-like, however mental status dramatically improved after this and patient alert P: -unclear etiology, ? Sepsis vs. Post-ictal vs. Metabolic -MRI head -EEG, neurology follow -serum tox pending  -Check Ammonia    Hypercarbic respiratory failure, likely CAP with undiagnosed OSA Improved on Bipap  P: -Continue Bipap for now and monitor mental status, does not appear significantly volume overloaded on  CXR or exam -repeat ABG for worsening somnolence  -significant leukocytosis but no hypotension, check lactic acid, continue empiric broad spectrum abx and follow blood and sputum cultures     ESRD Dialysis M/W/F, no missed sessions P: Seen by nephrology, initially too unstable for HD today, now that mental status has improved may be able to tolerate   DM, poorly controlled -SSI, [place]   HTN, HFpEF, HL BP slightly labile in the ED, last echo in 2020 with EF 65% moderately dilated RV with  moderately reduced systolic function, moderate RAE, mild LAE,  moderate TR P: -hold home metoprolol and hydralazine for now -continue statin and Asa    Best practice:  Diet: NPO on bipap Pain/Anxiety/Delirium protocol (if indicated): n/a VAP protocol (if indicated): n/a DVT prophylaxis: heparin GI prophylaxis: n/a Glucose control: SSI Levemir Mobility: bed rest Code Status: full code Family Communication: Pt's husband updated Disposition: ICU  Labs   CBC: Recent Labs  Lab 11/11/19 1822 11/12/19 0436 11/12/19 0511 11/12/19 0825 11/12/19 0948  WBC 22.9*  --  38.3*  --   --   NEUTROABS 18.8*  --   --   --   --   HGB 12.7 13.3 12.8 13.9 13.6  HCT 41.8 39.0 43.5 41.0 40.0  MCV 110.3*  --  113.6*  --   --   PLT 274  --  244  --   --     Basic Metabolic Panel: Recent Labs  Lab 11/11/19 1822 11/12/19 0436 11/12/19 0511 11/12/19 0825 11/12/19 0948    NA 136 133* 135 137 136  K 3.8 4.3 4.8 4.3 4.2  CL 90*  --  92*  --   --   CO2 27  --  22  --   --   GLUCOSE 308*  --  271*  --   --   BUN 39*  --  46*  --   --   CREATININE 9.64*  --  9.79*  --   --   CALCIUM 9.0  --  8.7*  --   --    GFR: Estimated Creatinine Clearance: 7.2 mL/min (A) (by C-G formula based on SCr of 9.79 mg/dL (H)). Recent Labs  Lab 11/11/19 1822 11/12/19 0511  WBC 22.9* 38.3*    Liver Function Tests: Recent Labs  Lab 11/11/19 1822 11/12/19 0511  AST 23 23  ALT 28 24  ALKPHOS 93 89  BILITOT 1.0 1.2  PROT 6.9 7.1  ALBUMIN 3.1* 3.0*   Recent Labs  Lab 11/11/19 1822  LIPASE 24   No results for input(s): AMMONIA in the last 168 hours.  ABG    Component Value Date/Time   PHART 7.447 11/12/2019 0948  PCO2ART 38.9 11/12/2019 0948   PO2ART 91 11/12/2019 0948   HCO3 26.8 11/12/2019 0948   TCO2 28 11/12/2019 0948   ACIDBASEDEF 2.1 (H) 11/12/2019 0125   O2SAT 97.0 11/12/2019 0948     Coagulation Profile: No results for input(s): INR, PROTIME in the last 168 hours.  Cardiac Enzymes: No results for input(s): CKTOTAL, CKMB, CKMBINDEX, TROPONINI in the last 168 hours.  HbA1C: HbA1c, POC (controlled diabetic range)  Date/Time Value Ref Range Status  03/11/2019 10:20 AM 7.7 (A) 0.0 - 7.0 % Final  08/23/2017 08:43 AM 9.1 (A) 0.0 - 7.0 % Final   Hgb A1c MFr Bld  Date/Time Value Ref Range Status  11/12/2019 05:10 AM 9.0 (H) 4.8 - 5.6 % Final    Comment:    (NOTE) Pre diabetes:          5.7%-6.4%  Diabetes:              >6.4%  Glycemic control for   <7.0% adults with diabetes   02/26/2018 03:48 AM 11.4 (H) 4.8 - 5.6 % Final    Comment:    (NOTE)         Prediabetes: 5.7 - 6.4         Diabetes: >6.4         Glycemic control for adults with diabetes: <7.0     CBG: Recent Labs  Lab 11/11/19 1800 11/12/19 0745  GLUCAP 248* 220*    Review of Systems:   Unable to obtain secondary to encephalopathy  Past Medical History  Rachel Chaney,   has a past medical history of Cardiac arrest ([place]) (02/28/2018), CHF (congestive heart failure) ([place]), Chronic kidney disease due to diabetes mellitus ([place]) (10/13/2013), Diabetes mellitus, ESRD (end stage renal disease) ([place]), Hyperlipidemia, Hypertension, and Neuropathy.   Surgical History    Past Surgical History:  Procedure Laterality Date  . AMPUTATION Right 02/27/2018   Procedure: RIGHT FOOT 2ND AND 3RD RAY AMPUTATION;  Surgeon: [name], MD;  Location: [facility];  Service: Orthopedics;  Laterality: Right;  . AMPUTATION Right 04/24/2018   Procedure: RIGHT TRANSMETATARSAL AMPUTATION, PLACE VAC;  Surgeon: [name], MD;  Location: [place] [facility];  Service: Orthopedics;  Laterality: Right;  . AV FISTULA PLACEMENT Left 04/15/2015   Procedure: ARTERIOVENOUS (AV) FISTULA CREATION;  Surgeon: Mal Misty, MD;  Location: [facility];  Service: Vascular;  Laterality: Left;  . EYE SURGERY Right    retinal detachment  . FISTULA SUPERFICIALIZATION Left 06/03/2015   Procedure: LEFT UPPER ARM BRACHIOCEPHALIC FISTULA SUPERFICIALIZATION;  Surgeon: Mal Misty, MD;  Location: [facility];  Service: Vascular;  Laterality: Left;  from [place] to General  . STUMP REVISION Right 05/24/2018   Procedure: REVISION RIGHT TRANSMETATARSAL AMPUTATION WITH EXCISION BONE/SOFT TISSUE, LOCAL TISSUE  RE-ARRANGEMENT  AND VAC PLACEMENT;  Surgeon: [name], MD;  Location: [facility];  Service: Orthopedics;  Laterality: Right;     Social History   reports that Rachel Chaney has never smoked. Rachel Chaney has never used smokeless tobacco. Rachel Chaney reports that Rachel Chaney does not drink alcohol and does not use drugs.   Family History   Her family history includes Heart disease in her father and mother.   Allergies No Known Allergies   Home Medications  Prior to Admission medications   Medication Sig Start Date End Date Taking? Authorizing Provider  acetaminophen (TYLENOL) 500 MG tablet Take 1,000 mg by mouth every 6 (six) hours as needed for pain.    [provider]  aspirin EC  81 MG tablet Take 1 tablet (81 mg total) by mouth daily. 04/30/18   Erlene Quan, PA-C  atorvastatin (LIPITOR) 80 MG tablet TAKE 1 TABLET(80 MG) BY MOUTH AT BEDTIME Patient taking differently: Take 80 mg by mouth at bedtime.  11/04/19   Benay Pike, MD  Blood Glucose Monitoring Suppl (ONE TOUCH ULTRA 2) w/Device KIT 1 kit by Does not apply route 3 (three) times daily. ICD-10 code: E11.40 03/26/18   Wille Celeste, PA-C  cadexomer iodine (IODOSORB) 0.9 % gel Apply to left 5th digit wounds once daily 07/22/19   Marzetta Board, DPM  carvedilol (COREG) 12.5 MG tablet Take 12.5 mg by mouth 2 (two) times daily. 10/13/19   [provider]  cetirizine (ZYRTEC) 10 MG tablet Take by mouth. 04/26/18   [provider]  cyclobenzaprine (FLEXERIL) 10 MG tablet Take 1 tablet (10 mg total) by mouth 3 times/day as needed-between meals & bedtime for muscle spasms. 06/16/19   Benay Pike, MD  Etelcalcetide HCl (PARSABIV IV) Etelcalcetide Hermina Staggers) 02/10/19 02/09/20  [provider]  ferric citrate (AURYXIA) 1 GM 210 MG(Fe) tablet Take 2 tablets (420 mg total) by mouth 3 (three) times daily with meals. Give two (420 mg) by mouth three times a day wih meals for ESRD Patient taking differently: Take 210-420 mg by mouth See admin instructions. Take 420 mg with each meal and 210 mg with snacks 03/26/18   Lassen, Arlo C, PA-C  fluticasone (FLONASE) 50 MCG/ACT nasal spray Place 1 spray into both nostrils daily. 06/16/19   Benay Pike, MD  glucose blood (ONE TOUCH ULTRA TEST) test strip 1 each by Other route 3 (three) times daily. ICD-10 code: E11.40. 03/26/18   Wille Celeste, PA-C  hydrALAZINE (APRESOLINE) 25 MG tablet Take 25 mg by mouth 2 (two) times a day.    [provider]  hydrOXYzine (ATARAX/VISTARIL) 25 MG tablet Take 25 mg by mouth 2 (two) times daily as needed for anxiety.  10/01/19   [provider]  insulin detemir (LEVEMIR) 100 UNIT/ML  FlexPen 50U in the am and 40U in pm 08/20/19   Benay Pike, MD  Insulin Pen Needle (B-D UF III MINI PEN NEEDLES) 31G X 5 MM MISC USE AS DIRECTED TWICE DAILY 04/30/19   Benay Pike, MD  Insulin Pen Needle 29G X 12MM MISC Inject 1 pen into the skin 2 (two) times daily. Check blood sugar daily. 03/26/18   Granville Lewis C, PA-C  Insulin Syringe-Needle U-100 Flossie Buffy INSULIN SYRINGE) 30G X 5/16" 1 ML MISC USE AS DIRECTED 03/26/18   Granville Lewis C, PA-C  Methoxy PEG-Epoetin Beta (MIRCERA IJ) Mircera 01/31/19 01/30/20  [provider]  multivitamin (RENA-VIT) TABS tablet Take 1 tablet by mouth daily.    [provider]  nitroGLYCERIN (NITRODUR - DOSED IN MG/24 HR) 0.2 mg/hr patch Place 1 patch (0.2 mg total) onto the skin daily. 05/23/18   Rayburn, Neta Mends, PA-C  OneTouch Delica Lancets 00F MISC 1 Device by Does not apply route as needed (to check blood glucose). 03/26/18   Granville Lewis C, PA-C  pentoxifylline (TRENTAL) 400 MG CR tablet Take 1 tablet (400 mg total) by mouth 3 (three) times daily with meals. 05/23/18   Rayburn, Neta Mends, PA-C  sucroferric oxyhydroxide (VELPHORO) 500 MG chewable tablet Chew by mouth. 07/30/19   [provider]  VELPHORO 500 MG chewable tablet Chew 1,000 mg by mouth 3 (three) times daily. 07/30/19   [provider]  progesterone (PROMETRIUM) 200 MG capsule Take 1 capsule (200 mg total) by mouth daily. Take for 10 days if bleeding occurs for more than 4 days. 11/09/10 03/28/11  Lyndal Pulley, DO     Critical care time: 50 minutes      CRITICAL CARE Performed by: Otilio Carpen Brode Sculley   Total critical care time: 50 minutes  Critical care time was exclusive of separately billable procedures and treating other patients.  Critical care was necessary to treat or prevent imminent or life-threatening deterioration.  Critical care was time spent personally by me on the following activities: development of treatment plan with  patient and/or surrogate as well as nursing, discussions with consultants, evaluation of patient's response to treatment, examination of patient, obtaining history from patient or surrogate, ordering and performing treatments and interventions, ordering and review of laboratory studies, ordering and review of radiographic studies, pulse oximetry and re-evaluation of patient's condition.  Otilio Carpen Karstyn Birkey, PA-C [place] [facility]  Pager# [phone], if no answer [phone]

## 2019-11-12 NOTE — Progress Notes (Signed)
Pharmacy Antibiotic Note  Rachel Chaney is a 59 y.o. female admitted on 11/11/2019 with pneumonia.  Pharmacy has been consulted for vancomycin and to resume Cefepime dosing. Pt is a MWF HD patient.   Patient transferred to unit on BiPAP to resume Cefepime for empiric PNA coverage with worsening respiratory status and leukocytosis.  Last dose of Cefepime was 11/2 at [facility].  Last HD on Monday.  Plan is for HD today in the unit.   Plan: Resume Cefepime at 2g IV MWF after HD Vancomycin 1000 mg IV MWF after HD Monitor clinical progress, HD plans  Height: 5\' 4"  (162.6 cm) Weight: 104.5 kg (230 lb 6.1 oz) IBW/kg (Calculated) : 54.7  Temp (24hrs), Avg:98.1 F (36.7 C), Min:98.1 F (36.7 C), Max:98.1 F (36.7 C)  Recent Labs  Lab 11/11/19 1822 11/12/19 0511  WBC 22.9* 38.3*  CREATININE 9.64* 9.79*    Estimated Creatinine Clearance: 7.2 mL/min (A) (by C-G formula based on SCr of 9.79 mg/dL (H)).    No Known Allergies  Antimicrobials this admission: Cefepime 11/2 >> Vancomycin 11/2 >>   Microbiology results: 11/3 Blood >> 11/3 Sputum >> 11/3 MRSA PCR >> 11/2 COVID/Flu negative  Thank you for allowing pharmacy to be a part of this patient's care.  Sloan Leiter, PharmD, BCPS, BCCCP Clinical Pharmacist Please refer to [facility] for [place] numbers 11/12/2019 11:21 AM  Please check [email or web address] for unit-specific pharmacy phone numbers.

## 2019-11-12 NOTE — Progress Notes (Signed)
RN notified that 2W bed not appropriate for pt at this time. Pt needs a higher level of care per settings on bipap and ABG results. ED RN to notify MD.

## 2019-11-12 NOTE — ED Notes (Signed)
Pt extremely lethargic, Pt will not stay awake for very long. Critical Care called

## 2019-11-12 NOTE — ED Notes (Addendum)
Called [facility] and they stated they would call back if they were going to take the Pt or divert to another  room

## 2019-11-12 NOTE — Consult Note (Signed)
[facility] Renal Consultation Note    Indication for Consultation:  Management of ESRD/hemodialysis, anemia, hypertension/volume, and secondary hyperparathyroidism.  HPI: Rachel Chaney is a 59 y.o. female w/ hx of ESRD on HD, DM2, HL, HTN, PAD sp R TMA, neuropathy presented to ED last night for seizure activity. No hx seizures. Vomited post-ictal. Had some posturing during ambulance ride to hospital. In ED sats were 70's on RA, placed on NRB. CXR showed bibasilar PNA, WBC 38K, afebrile, COVID neg. Given IV vanc/ cefepime in ED. ABG showed > 7.04/ 106/ 83. K+ 4.8, BUN 46, Cr 9.7. Placed on bipap this am around 3 am. Asked to see for ESRD.    Pt seen in ED, pt is on bipap, unable to awaken for more than a few seconds. RR 22- 26 on bipap.  CXR this also showing bilat basilar opacities, and vasc congestion. Pt not able to provide any hx.   Past Medical History:  Diagnosis Date  . Cardiac arrest ([place]) 02/28/2018   while on hemodialysis in hospital  . CHF (congestive heart failure) ([place])   . Chronic kidney disease due to diabetes mellitus ([place]) 10/13/2013  . Diabetes mellitus    Type II  . ESRD (end stage renal disease) ([place])    MWF - [facility]  . Hyperlipidemia   . Hypertension   . Neuropathy    Past Surgical History:  Procedure Laterality Date  . AMPUTATION Right 02/27/2018   Procedure: RIGHT FOOT 2ND AND 3RD RAY AMPUTATION;  Surgeon: [name], MD;  Location: [place];  Service: Orthopedics;  Laterality: Right;  . AMPUTATION Right 04/24/2018   Procedure: RIGHT TRANSMETATARSAL AMPUTATION, PLACE VAC;  Surgeon: [name], MD;  Location: [place];  Service: Orthopedics;  Laterality: Right;  . AV FISTULA PLACEMENT Left 04/15/2015   Procedure: ARTERIOVENOUS (AV) FISTULA CREATION;  Surgeon: Mal Misty, MD;  Location: [place];  Service: Vascular;  Laterality: Left;  . EYE SURGERY Right    retinal detachment  . FISTULA SUPERFICIALIZATION Left 06/03/2015   Procedure:  LEFT UPPER ARM BRACHIOCEPHALIC FISTULA SUPERFICIALIZATION;  Surgeon: Mal Misty, MD;  Location: [place];  Service: Vascular;  Laterality: Left;  from [place] to General  . STUMP REVISION Right 05/24/2018   Procedure: REVISION RIGHT TRANSMETATARSAL AMPUTATION WITH EXCISION BONE/SOFT TISSUE, LOCAL TISSUE  RE-ARRANGEMENT  AND VAC PLACEMENT;  Surgeon: [name], MD;  Location: [place];  Service: Orthopedics;  Laterality: Right;   Family History  Problem Relation Age of Onset  . Heart disease Mother   . Heart disease Father    Social History:  reports that she has never smoked. She has never used smokeless tobacco. She reports that she does not drink alcohol and does not use drugs.  ROS: As per HPI otherwise negative.  Review of Systems: Gen: Denies any fever, chills, fatigue, weakness, malaise, weight loss, and/or sleep disorders. HEENT: Denies blurred vision, sore throat or epistaxis. No sinusitis.   CV: Denies chest pain, angina, palpitations, orthopnea, PND, peripheral edema, and claudication. Resp: Denies dyspnea at rest, dyspnea with exercise, cough, sputum, or wheezing. GI: Denies nausea, vomiting, abdominal pain, constipation or diarrhea. GU: Denies dysuria or hematuria. MS: Denies joint pain, muscle pain or cramps.  Derm: Denies rashes, itching, or ulcerations. Psych: Denies depression and anxiety. Heme: Denies bruising, bleeding, and enlarged lymph nodes. Neuro: No headache, dizziness, or weakness. Endocrine: No hypoglycemia or thyroid disease.  Physical Exam: Vitals:   11/12/19 0628 11/12/19 0630 11/12/19  0277 11/12/19 0745  BP:  (!) 133/50  (!) 124/56  Pulse: 71     Resp:  18  14  Temp:      TempSrc:      SpO2: 100%  99%   Weight:      Height:         General: Well developed, well nourished, in no acute distress. Head: Normocephalic, atraumatic, sclera non-icteric, mucus membranes are moist. Neck: Supple without lymphadenopathy/masses. JVD not elevated. Lungs: Clear  bilaterally to auscultation without wheezes, rales, or rhonchi. Breathing is unlabored. Heart: RRR with normal S1, S2. No murmurs, rubs, or gallops appreciated. Abdomen: Soft, non-tender, non-distended with normoactive bowel sounds. No rebound/guarding. No obvious abdominal masses. Musculoskeletal:  Strength and tone appear normal for age. Lower extremities: No edema or ischemic changes, no open wounds. Neuro: Alert and oriented X 3. Moves all extremities spontaneously. Psych:  Responds to questions appropriately with a normal affect. Dialysis Access:  No Known Allergies Prior to Admission medications   Medication Sig Start Date End Date Taking? Authorizing Provider  acetaminophen (TYLENOL) 500 MG tablet Take 1,000 mg by mouth every 6 (six) hours as needed for pain.    [provider]  aspirin EC 81 MG tablet Take 1 tablet (81 mg total) by mouth daily. 04/30/18   Erlene Quan, PA-C  atorvastatin (LIPITOR) 80 MG tablet TAKE 1 TABLET(80 MG) BY MOUTH AT BEDTIME Patient taking differently: Take 80 mg by mouth at bedtime.  11/04/19   Benay Pike, MD  Blood Glucose Monitoring Suppl (ONE TOUCH ULTRA 2) w/Device KIT 1 kit by Does not apply route 3 (three) times daily. ICD-10 code: E11.40 03/26/18   Wille Celeste, PA-C  cadexomer iodine (IODOSORB) 0.9 % gel Apply to left 5th digit wounds once daily 07/22/19   Marzetta Board, DPM  carvedilol (COREG) 12.5 MG tablet Take 12.5 mg by mouth 2 (two) times daily. 10/13/19   [provider]  cetirizine (ZYRTEC) 10 MG tablet Take by mouth. 04/26/18   [provider]  cyclobenzaprine (FLEXERIL) 10 MG tablet Take 1 tablet (10 mg total) by mouth 3 times/day as needed-between meals & bedtime for muscle spasms. 06/16/19   Benay Pike, MD  Etelcalcetide HCl (PARSABIV IV) Etelcalcetide Hermina Staggers) 02/10/19 02/09/20  [provider]  ferric citrate (AURYXIA) 1 GM 210 MG(Fe) tablet Take 2 tablets (420 mg total) by mouth 3 (three)  times daily with meals. Give two (420 mg) by mouth three times a day wih meals for ESRD Patient taking differently: Take 210-420 mg by mouth See admin instructions. Take 420 mg with each meal and 210 mg with snacks 03/26/18   Lassen, Arlo C, PA-C  fluticasone (FLONASE) 50 MCG/ACT nasal spray Place 1 spray into both nostrils daily. 06/16/19   Benay Pike, MD  glucose blood (ONE TOUCH ULTRA TEST) test strip 1 each by Other route 3 (three) times daily. ICD-10 code: E11.40. 03/26/18   Wille Celeste, PA-C  hydrALAZINE (APRESOLINE) 25 MG tablet Take 25 mg by mouth 2 (two) times a day.    [provider]  hydrOXYzine (ATARAX/VISTARIL) 25 MG tablet Take 25 mg by mouth 2 (two) times daily as needed for anxiety.  10/01/19   [provider]  insulin detemir (LEVEMIR) 100 UNIT/ML FlexPen 50U in the am and 40U in pm 08/20/19   Benay Pike, MD  Insulin Pen Needle (B-D UF III MINI PEN NEEDLES) 31G X 5 MM MISC USE AS DIRECTED TWICE  DAILY 04/30/19   Benay Pike, MD  Insulin Pen Needle 29G X 12MM MISC Inject 1 pen into the skin 2 (two) times daily. Check blood sugar daily. 03/26/18   Granville Lewis C, PA-C  Insulin Syringe-Needle U-100 Flossie Buffy INSULIN SYRINGE) 30G X 5/16" 1 ML MISC USE AS DIRECTED 03/26/18   Granville Lewis C, PA-C  Methoxy PEG-Epoetin Beta (MIRCERA IJ) Mircera 01/31/19 01/30/20  [provider]  multivitamin (RENA-VIT) TABS tablet Take 1 tablet by mouth daily.    [provider]  nitroGLYCERIN (NITRODUR - DOSED IN MG/24 HR) 0.2 mg/hr patch Place 1 patch (0.2 mg total) onto the skin daily. 05/23/18   Rayburn, Neta Mends, PA-C  OneTouch Delica Lancets 24M MISC 1 Device by Does not apply route as needed (to check blood glucose). 03/26/18   Granville Lewis C, PA-C  pentoxifylline (TRENTAL) 400 MG CR tablet Take 1 tablet (400 mg total) by mouth 3 (three) times daily with meals. 05/23/18   Rayburn, Neta Mends, PA-C  sucroferric oxyhydroxide (VELPHORO) 500 MG chewable  tablet Chew by mouth. 07/30/19   [provider]  VELPHORO 500 MG chewable tablet Chew 1,000 mg by mouth 3 (three) times daily. 07/30/19   [provider]  progesterone (PROMETRIUM) 200 MG capsule Take 1 capsule (200 mg total) by mouth daily. Take for 10 days if bleeding occurs for more than 4 days. 11/09/10 03/28/11  Lyndal Pulley, DO   Current Facility-Administered Medications  Medication Dose Route Frequency Provider Last Rate Last Admin  . atorvastatin (LIPITOR) tablet 80 mg  80 mg Oral Daily Milus Banister C, DO      . azithromycin (ZITHROMAX) 500 mg in sodium chloride 0.9 % 250 mL IVPB  500 mg Intravenous Q24H Lenoria Chime, MD      . heparin injection 5,000 Units  5,000 Units Subcutaneous Q8H Milus Banister C, DO   5,000 Units at 11/12/19 0553  . insulin aspart (novoLOG) injection 0-15 Units  0-15 Units Subcutaneous TID WC Milus Banister C, DO   5 Units at 11/12/19 0758  . insulin detemir (LEVEMIR) injection 25 Units  25 Units Subcutaneous BID Daisy Floro, DO      . naloxone [facility]) injection 0.4 mg  0.4 mg Intravenous PRN Milus Banister C, DO   0.4 mg at 11/12/19 0254  . vancomycin (VANCOCIN) IVPB 1000 mg/200 mL premix  1,000 mg Intravenous Q M,W,F-HD Daisy Floro, DO       Current Outpatient Medications  Medication Sig Dispense Refill  . acetaminophen (TYLENOL) 500 MG tablet Take 1,000 mg by mouth every 6 (six) hours as needed for pain.    Marland Kitchen aspirin EC 81 MG tablet Take 1 tablet (81 mg total) by mouth daily. 90 tablet 3  . atorvastatin (LIPITOR) 80 MG tablet TAKE 1 TABLET(80 MG) BY MOUTH AT BEDTIME (Patient taking differently: Take 80 mg by mouth at bedtime. ) 90 tablet 3  . Blood Glucose Monitoring Suppl (ONE TOUCH ULTRA 2) w/Device KIT 1 kit by Does not apply route 3 (three) times daily. ICD-10 code: E11.40 1 each 0  . cadexomer iodine (IODOSORB) 0.9 % gel Apply to left 5th digit wounds once daily 40 g 0  . carvedilol (COREG) 12.5 MG tablet  Take 12.5 mg by mouth 2 (two) times daily.    . cetirizine (ZYRTEC) 10 MG tablet Take by mouth.    . cyclobenzaprine (FLEXERIL) 10 MG tablet Take 1 tablet (10 mg total) by mouth 3 times/day as needed-between meals &  bedtime for muscle spasms. 30 tablet 0  . Etelcalcetide HCl (PARSABIV IV) Etelcalcetide (Parsabiv)    . ferric citrate (AURYXIA) 1 GM 210 MG(Fe) tablet Take 2 tablets (420 mg total) by mouth 3 (three) times daily with meals. Give two (420 mg) by mouth three times a day wih meals for ESRD (Patient taking differently: Take 210-420 mg by mouth See admin instructions. Take 420 mg with each meal and 210 mg with snacks) 180 tablet 0  . fluticasone (FLONASE) 50 MCG/ACT nasal spray Place 1 spray into both nostrils daily. 16 g 5  . glucose blood (ONE TOUCH ULTRA TEST) test strip 1 each by Other route 3 (three) times daily. ICD-10 code: E11.40. 100 each 0  . hydrALAZINE (APRESOLINE) 25 MG tablet Take 25 mg by mouth 2 (two) times a day.    . hydrOXYzine (ATARAX/VISTARIL) 25 MG tablet Take 25 mg by mouth 2 (two) times daily as needed for anxiety.     . insulin detemir (LEVEMIR) 100 UNIT/ML FlexPen 50U in the am and 40U in pm 15 mL 11  . Insulin Pen Needle (B-D UF III MINI PEN NEEDLES) 31G X 5 MM MISC USE AS DIRECTED TWICE DAILY 200 each 0  . Insulin Pen Needle 29G X 12MM MISC Inject 1 pen into the skin 2 (two) times daily. Check blood sugar daily. 100 each 0  . Insulin Syringe-Needle U-100 (ULTICARE INSULIN SYRINGE) 30G X 5/16" 1 ML MISC USE AS DIRECTED 100 each 0  . Methoxy PEG-Epoetin Beta (MIRCERA IJ) Mircera    . multivitamin (RENA-VIT) TABS tablet Take 1 tablet by mouth daily.    . nitroGLYCERIN (NITRODUR - DOSED IN MG/24 HR) 0.2 mg/hr patch Place 1 patch (0.2 mg total) onto the skin daily. 30 patch 12  . OneTouch Delica Lancets 58P MISC 1 Device by Does not apply route as needed (to check blood glucose). 100 each 0  . pentoxifylline (TRENTAL) 400 MG CR tablet Take 1 tablet (400 mg total) by  mouth 3 (three) times daily with meals. 90 tablet 1  . sucroferric oxyhydroxide (VELPHORO) 500 MG chewable tablet Chew by mouth.    . VELPHORO 500 MG chewable tablet Chew 1,000 mg by mouth 3 (three) times daily.     Labs: Basic Metabolic Panel: Recent Labs  Lab 11/11/19 1822 11/11/19 1822 11/12/19 0436 11/12/19 0511 11/12/19 0825  NA 136   < > 133* 135 137  K 3.8   < > 4.3 4.8 4.3  CL 90*  --   --  92*  --   CO2 27  --   --  22  --   GLUCOSE 308*  --   --  271*  --   BUN 39*  --   --  46*  --   CREATININE 9.64*  --   --  9.79*  --   CALCIUM 9.0  --   --  8.7*  --    < > = values in this interval not displayed.   Liver Function Tests: Recent Labs  Lab 11/11/19 1822 11/12/19 0511  AST 23 23  ALT 28 24  ALKPHOS 93 89  BILITOT 1.0 1.2  PROT 6.9 7.1  ALBUMIN 3.1* 3.0*   Recent Labs  Lab 11/11/19 1822  LIPASE 24   No results for input(s): AMMONIA in the last 168 hours. CBC: Recent Labs  Lab 11/11/19 1822 11/11/19 1822 11/12/19 0436 11/12/19 0511 11/12/19 0825  WBC 22.9*  --   --  38.3*  --  NEUTROABS 18.8*  --   --   --   --   HGB 12.7   < > 13.3 12.8 13.9  HCT 41.8   < > 39.0 43.5 41.0  MCV 110.3*  --   --  113.6*  --   PLT 274  --   --  244  --    < > = values in this interval not displayed.   CBG: Recent Labs  Lab 11/11/19 1800 11/12/19 0745  GLUCAP 248* 220*   Iron Studies:  Recent Labs    11/12/19 0511  IRON 41  TIBC 252  FERRITIN 977*   Studies/Results: DG Chest 2 View  Result Date: 11/11/2019 CLINICAL DATA:  Hypoxia. EXAM: CHEST - 2 VIEW COMPARISON:  March 10, 2018. FINDINGS: Stable cardiomegaly. No pneumothorax is noted. Stable bibasilar opacities are noted concerning for pneumonia. No significant pleural effusion is noted. Bony thorax is unremarkable. IMPRESSION: Stable bibasilar opacities concerning for pneumonia. Aortic Atherosclerosis (ICD10-I70.0). Electronically Signed   By: Marijo Conception M.D.   On: 11/11/2019 18:39   CT HEAD WO  CONTRAST  Result Date: 11/11/2019 CLINICAL DATA:  Seizure activity, vomiting, postictal EXAM: CT HEAD WITHOUT CONTRAST TECHNIQUE: Contiguous axial images were obtained from the base of the skull through the vertex without intravenous contrast. COMPARISON:  03/06/2018 FINDINGS: Brain: No acute infarct or hemorrhage. Lateral ventricles and midline structures are stable. Stable scattered hypodensities in the periventricular white matter consistent with chronic small vessel ischemic change. No acute extra-axial fluid collections. No mass effect. Vascular: No hyperdense vessel or unexpected calcification. Skull: Normal. Negative for fracture or focal lesion. Sinuses/Orbits: No acute finding. Other: None. IMPRESSION: 1. Stable exam, no acute process. Electronically Signed   By: Randa Ngo M.D.   On: 11/11/2019 19:37   Exam: on bipap, unresponsive, RR 24  no jvd , throat not seen  Chest anterior crackles, bibasilar crackels, no wheezing, somewhat poor air movement  Cor reg no RG  Abd soft ntnd no ascites   Ext 1+ pretib edema   Neuro on bipap, not following commands, not awakening to stimulus    Home meds:   - asa / lipitor/ hydralazine 25 bid/ ntg patch qd/ trental 400 tid  - velphoro 1 gm tid ac/ auryxia 469m tid ac/  sensipar 30 tiw  - insulin levemir bid   CXR 11/3 - bibasilar infiltrates, vasc congestion     Dialysis:  [facility] M[facility]   3.5h  450/800  100kg (just lowered) P4  LUE AVF 15g  Hep 8000 hectorol 140m IVP q HD sensipar 3036mO q HD   Assessment/Plan: 1.  Acute resp failure / Pneumonia - in resp distress, now on Bipap in ED. Pt unresponsive and too unstable for dialysis upstairs.  Will reassess after seen by CCM.  2.  ESRD:  HD MWF. Last HD Monday. As above.  3.  Hypertension/volume: BP's appear wnl, mild vol excess pretib edema on exam. No pulm edema by CXR.  4.  Anemia ckd - Hb 12, not on esa as OP 5.  Metabolic bone disease/ ckd - cont hect, sensipar, velphoro/ [place]

## 2019-11-12 NOTE — Progress Notes (Signed)
Patient currently tolerating nasal cannula well. BIPAP not needed at this time.

## 2019-11-12 NOTE — Progress Notes (Addendum)
INTERIM PROGRESS NOTE  Patient's ABG returned with CO2 elevated at 106 and pH low at 7.0. I walked to the ED to visit the patient who was sleeping in bed but difficult to arouse. [place] at 4 L was in place and patient satting 98%. RT was contacted and patient was started on BiPAP for Hypercapnia.  Plan to repeat stat ABG at 4am. RT will re-check BiPAP machine around 3am and change settings as needed. RR currently set to 18 to improve ventilation.    ABG    Component Value Date/Time   PHART 7.044 (LL) 11/12/2019 0125   PCO2ART 106 (HH) 11/12/2019 0125   PO2ART 83.7 11/12/2019 0125   HCO3 27.7 11/12/2019 0125   TCO2 27 03/07/2018 0949   ACIDBASEDEF 2.1 (H) 11/12/2019 0125   O2SAT 88.3 11/12/2019 0125   Patient's admission order also changed to "Progressive Unit" given now being on BiPAP and therefore inappropriate for admission to the floor. Attending (Dr. Andria Frames) notified, also recommended treating her with 1x Dose of Narcan in case opioids are partially contributing to her somnolence. PDMP checked and last opioid Rx was in May 2020, however as we cannot get an adequate history from the patient we cannot rule out prescriptions coming in from different states or from other sources. Additionally patient is anuric due to ESRD and therefore rapid drug screen cannot be performed. Narcan 0.4mg  ordered, nurse Shirlee Limerick notified about the plan, voiced understanding, will notify us of any significant change.    Milus Banister, [place], PGY-3 11/12/2019 2:21 AM

## 2019-11-12 NOTE — Progress Notes (Signed)
Patient alert and oriented requesting to be taken off BIPAP.  Placed on 5L [place] SPO2 96%.  Pt able to speak clearly without any SOB.  RT to monitor

## 2019-11-12 NOTE — Progress Notes (Signed)
eLink Physician-Brief Progress Note Patient Name: Rachel Chaney DOB: 07-11-1960 MRN: [id number]   Date of Service  11/12/2019  HPI/Events of Note  Hypoglycemia - Blood glucose = 78. Now off BiPAP.   eICU Interventions  Plan: 1. Advance diet to clear liquids.  2. D10W IV infusion to run at 20 mL/hour.      Intervention Category Major Interventions: Other:  Lysle Dingwall 11/12/2019, 8:00 PM

## 2019-11-12 NOTE — Procedures (Signed)
Patient Name: Rachel Chaney  MRN: [id number]  Epilepsy Attending: Lora Havens  Referring Physician/Provider: Elease Etienne, PA  Date: 11/12/2019 Duration: 27.26 mins  Patient history: 59 year old female presented with first-time seizure.  EEG to evaluate for seizure.  Level of alertness:  lethargic  AEDs during EEG study: None  Technical aspects: This EEG study was done with scalp electrodes positioned according to the 10-20 International system of electrode placement. Electrical activity was acquired at a sampling rate of 500Hz  and reviewed with a high frequency filter of 70Hz  and a low frequency filter of 1Hz . EEG data were recorded continuously and digitally stored.   Description: EEG showed continuous generalized 3 to 5 Hz theta and delta slowing.  Hyperventilation and photic stimulation were not performed.     ABNORMALITY -Continuous slow, generalized  IMPRESSION: This study is suggestive of severe diffuse encephalopathy, nonspecific to etiology.  No seizures or epileptiform discharges were seen throughout the recording.  Elijan Googe Barbra Sarks

## 2019-11-12 NOTE — ED Notes (Signed)
Pt bladder scanned. 0 mL noted X2.

## 2019-11-12 NOTE — Progress Notes (Signed)
EEG complete - results pending 

## 2019-11-12 NOTE — Progress Notes (Signed)
Family Medicine Teaching Service Daily Progress Note Intern Pager: [phone]  Patient name: Rachel Chaney Medical record number: [id number] Date of birth: [birth year] Age: 59 y.o. Gender: female  Primary Care Provider: Benay Pike, MD Consultants: neuro, nephro Code Status: full  Pt Overview and Major Events to Date:  11/2 - admitted  Assessment and Plan: Nemesis I Laguardia is a 59 y.o. female presenting with seizure-like activity and vomiting. PMH is significant for ESRD on HD MWF, T2DM, Macrocytic anemia, HLD, HTN, GERD and H/o Paroxysmal Afib.  Acute hypoxic/hypercapnic respiratory failure 2/2 CAP vs aspiration PNA 1d hx of somnolence, n/v, and LOC. ABG shows severe respiratory acidosis w/ pH 7.0 on admission and CO2 > 100. SPO2 initially 70s on room air. WBC 22>38k. Currently on bipap..  CXR shows bibasilar opacities concerning for PNA, worst on the left lower lobe.  Pt was started on cefepime and vanc in the ED.  When I went to examine the patient she was on bipap, with RT in the room.  Pt was minimally responsive to stimulation.  Critical care was called for consult.  - critical care consulted for possible ICU transfer.  Appreciate recommendations.   - continue Abx.  Consider de-escalating vanc if MRSA pcr negative.  - continue azithro.  - supplemental O2.  Wean as tolerated.  Goal 94% or higher.  - vitals per routine - PT/OT recs - continuous cardiac and pulse ox monitoring.  - am cbc, rfp - afternoon ABG.    Encephalopathy  concern for Seizure Pt described as "going limp/numb". No seizure like activity since admission. Neurology consulted and believes d/t combination of hypercarbia and infection.   - neurology consulted, appreciate recs.  - EEG - holding anticonvulsants.   - MRI brain w/o contrast when stable.     ESRD on HD MWF Complaint.  Received dialysis at [facility].   - nephrology consulted - AM RFP  T2DM  cbg on admission 308.  On levemir 50 U AM and 40 U  pm at home.   - levemir 25mg  BID - mSSI 4 times daily  Macrocytosis Normal hgb at 12.7.  Normal iron, elevated ferritin, normal B12 and folate.   HLD:  ASCVD risk 9.1%, patient already on Lipitor 80mg  daily. LDL 35.  -Continue home Atorva 80  Essential HTN: BPs on admission shows normal systolic with diastolic < 74B. Pt takes Carvedilol 25 mg BID and Hydralazine 25 mg BID.  -Carvedilol discontinued d/t low diastolic pressures.  -Hold off Hydralazine, can add back as BP indicates  History of paroxysmal Afib: Pt takes Aspirin EC 81 mg Daily. Patient in normal sinus rhythm in the ED. EKG w/o pAF.  -Hold home ASA 81 due to new recommendation to not start as primary prevention in patient's 56+ years older -Continuous cardiac monitoring  FEN/GI: NPO PPx: heparin  Disposition: undetermined.   Subjective:  Pt not responsive to questioning.   Objective: Temp:  [98.1 F (36.7 C)] 98.1 F (36.7 C) (11/02 1807) Pulse Rate:  [71-93] 71 (11/03 0628) Resp:  [10-20] 18 (11/03 0630) BP: (101-158)/(33-107) 133/50 (11/03 0630) SpO2:  [90 %-100 %] 99 % (11/03 0659) Weight:  [104.5 kg] 104.5 kg (11/02 2000) Physical Exam: General: somnolent. On bipap.   Cardiovascular: RRR. No murmurs.  Respiratory: course breath sounds anteriorly.  Abdomen: large pannus.  Nontender.  Extremities: no pitting edema.   Laboratory: Recent Labs  Lab 11/11/19 1822 11/12/19 0436 11/12/19 0511  WBC 22.9*  --  38.3*  HGB 12.7 13.3  12.8  HCT 41.8 39.0 43.5  PLT 274  --  244   Recent Labs  Lab 11/11/19 1822 11/12/19 0436 11/12/19 0511  NA 136 133* 135  K 3.8 4.3 4.8  CL 90*  --  92*  CO2 27  --  22  BUN 39*  --  46*  CREATININE 9.64*  --  9.79*  CALCIUM 9.0  --  8.7*  PROT 6.9  --  7.1  BILITOT 1.0  --  1.2  ALKPHOS 93  --  89  ALT 28  --  24  AST 23  --  23  GLUCOSE 308*  --  271*      Imaging/Diagnostic Tests:   Benay Pike, MD 11/12/2019, 7:50 AM PGY-3, [place] Intern pager: [phone], text pages welcome

## 2019-11-12 NOTE — Procedures (Signed)
   I was present at this dialysis session, have reviewed the session itself and made  appropriate changes Kelly Splinter MD [place] pager [phone]   11/12/2019, 4:12 PM

## 2019-11-12 NOTE — Progress Notes (Signed)
Critical ABG results given to ED RN. Minute ventilation increased 3L on bipap from 10L to 13L but pt is requiring constant stimulation to take a deep breath.    Results for Rachel Chaney, Rachel Chaney (MRN [id number]) as of 11/12/2019 08:36  Ref. Range 11/12/2019 08:25  Sample type Unknown ARTERIAL  pH, Arterial Latest Ref Range: 7.35 - 7.45  7.226 (L)  pCO2 arterial Latest Ref Range: 32 - 48 mmHg 76.0 (HH)  pO2, Arterial Latest Ref Range: 83 - 108 mmHg 117 (H)  TCO2 Latest Ref Range: 22 - 32 mmol/L 34 (H)  Acid-Base Excess Latest Ref Range: 0.0 - 2.0 mmol/L 2.0  Bicarbonate Latest Ref Range: 20.0 - 28.0 mmol/L 31.6 (H)  O2 Saturation Latest Units: % 97.0  Sodium Latest Ref Range: 135 - 145 mmol/L 137  Potassium Latest Ref Range: 3.5 - 5.1 mmol/L 4.3  Calcium Ionized Latest Ref Range: 1.15 - 1.40 mmol/L 1.15  Hemoglobin Latest Ref Range: 12.0 - 15.0 g/dL 13.9  HCT Latest Ref Range: 36 - 46 % 41.0

## 2019-11-12 NOTE — ED Notes (Signed)
MD at bedside. Pt vitals stable and ABG has significantly improved.

## 2019-11-12 NOTE — Progress Notes (Addendum)
CCM NP in room and notified of pt's mental status change. At 0830, pt was able to be stimulated awake briefly to open eyes and answer yes or no questions and at 0940 pt is unable to arouse. Orders given to collect another ABG. Results listed below.  Results for Rachel Chaney, Rachel Chaney (MRN [id number]) as of 11/12/2019 10:10  Ref. Range 11/12/2019 09:48  Sample type Unknown ARTERIAL  pH, Arterial Latest Ref Range: 7.35 - 7.45  7.447  pCO2 arterial Latest Ref Range: 32 - 48 mmHg 38.9  pO2, Arterial Latest Ref Range: 83 - 108 mmHg 91  TCO2 Latest Ref Range: 22 - 32 mmol/L 28  Acid-Base Excess Latest Ref Range: 0.0 - 2.0 mmol/L 3.0 (H)  Bicarbonate Latest Ref Range: 20.0 - 28.0 mmol/L 26.8  O2 Saturation Latest Units: % 97.0  Sodium Latest Ref Range: 135 - 145 mmol/L 136  Potassium Latest Ref Range: 3.5 - 5.1 mmol/L 4.2  Calcium Ionized Latest Ref Range: 1.15 - 1.40 mmol/L 1.08 (L)  Hemoglobin Latest Ref Range: 12.0 - 15.0 g/dL 13.6  HCT Latest Ref Range: 36 - 46 % 40.0

## 2019-11-12 NOTE — ED Notes (Signed)
Nephrology came by to assess

## 2019-11-12 NOTE — Progress Notes (Signed)
[facility] Progress Note Patient Name: Rachel Chaney DOB: February 18, 1960 MRN: [id number]   Date of Service  11/12/2019  HPI/Events of Note  Back pain - AST and ALT are both normal.   eICU Interventions  Plan: 1. Tylenol 650 mg PO Q 6 hours PRN pain or headache.      Intervention Category Major Interventions: Other:  Lysle Dingwall 11/12/2019, 10:42 PM

## 2019-11-12 NOTE — ED Notes (Signed)
Pt tubinng had become disloodged from bi pap. Heard alarms and place tubing back in. Aroused Pt to wake up and take breathes. Currently at 100%. Spoke to attending MD to get status upgraded from [facility]

## 2019-11-13 DIAGNOSIS — E162 Hypoglycemia, unspecified: Secondary | ICD-10-CM | POA: Diagnosis not present

## 2019-11-13 DIAGNOSIS — J810 Acute pulmonary edema: Secondary | ICD-10-CM | POA: Diagnosis not present

## 2019-11-13 DIAGNOSIS — J9622 Acute and chronic respiratory failure with hypercapnia: Secondary | ICD-10-CM

## 2019-11-13 DIAGNOSIS — J9621 Acute and chronic respiratory failure with hypoxia: Secondary | ICD-10-CM

## 2019-11-13 DIAGNOSIS — N186 End stage renal disease: Secondary | ICD-10-CM | POA: Diagnosis not present

## 2019-11-13 LAB — COMPREHENSIVE METABOLIC PANEL
ALT: 20 U/L (ref 0–44)
AST: 18 U/L (ref 15–41)
Albumin: 3.1 g/dL — ABNORMAL LOW (ref 3.5–5.0)
Alkaline Phosphatase: 57 U/L (ref 38–126)
Anion gap: 15 (ref 5–15)
BUN: 18 mg/dL (ref 6–20)
CO2: 27 mmol/L (ref 22–32)
Calcium: 8.9 mg/dL (ref 8.9–10.3)
Chloride: 97 mmol/L — ABNORMAL LOW (ref 98–111)
Creatinine, Ser: 6.15 mg/dL — ABNORMAL HIGH (ref 0.44–1.00)
GFR, Estimated: 7 mL/min — ABNORMAL LOW (ref >60)
Glucose, Bld: 84 mg/dL (ref 70–99)
Potassium: 3.6 mmol/L (ref 3.5–5.1)
Sodium: 139 mmol/L (ref 135–145)
Total Bilirubin: 1 mg/dL (ref 0.3–1.2)
Total Protein: 6 g/dL — ABNORMAL LOW (ref 6.5–8.1)

## 2019-11-13 LAB — CBC WITH DIFFERENTIAL/PLATELET
Abs Immature Granulocytes: 0.1 10*3/uL — ABNORMAL HIGH (ref 0.00–0.07)
Basophils Absolute: 0.1 10*3/uL (ref 0.0–0.1)
Basophils Relative: 0 %
Eosinophils Absolute: 0.3 10*3/uL (ref 0.0–0.5)
Eosinophils Relative: 2 %
HCT: 36.6 % (ref 36.0–46.0)
Hemoglobin: 11.2 g/dL — ABNORMAL LOW (ref 12.0–15.0)
Immature Granulocytes: 1 %
Lymphocytes Relative: 9 %
Lymphs Abs: 1.6 10*3/uL (ref 0.7–4.0)
MCH: 33.8 pg (ref 26.0–34.0)
MCHC: 30.6 g/dL (ref 30.0–36.0)
MCV: 110.6 fL — ABNORMAL HIGH (ref 80.0–100.0)
Monocytes Absolute: 1.1 10*3/uL — ABNORMAL HIGH (ref 0.1–1.0)
Monocytes Relative: 6 %
Neutro Abs: 14.7 10*3/uL — ABNORMAL HIGH (ref 1.7–7.7)
Neutrophils Relative %: 82 %
Platelets: 187 10*3/uL (ref 150–400)
RBC: 3.31 MIL/uL — ABNORMAL LOW (ref 3.87–5.11)
RDW: 17.2 % — ABNORMAL HIGH (ref 11.5–15.5)
WBC: 17.8 10*3/uL — ABNORMAL HIGH (ref 4.0–10.5)
nRBC: 0.2 % (ref 0.0–0.2)

## 2019-11-13 LAB — CBC
HCT: 34.4 % — ABNORMAL LOW (ref 36.0–46.0)
Hemoglobin: 10.6 g/dL — ABNORMAL LOW (ref 12.0–15.0)
MCH: 34.2 pg — ABNORMAL HIGH (ref 26.0–34.0)
MCHC: 30.8 g/dL (ref 30.0–36.0)
MCV: 111 fL — ABNORMAL HIGH (ref 80.0–100.0)
Platelets: 175 10*3/uL (ref 150–400)
RBC: 3.1 MIL/uL — ABNORMAL LOW (ref 3.87–5.11)
RDW: 17.5 % — ABNORMAL HIGH (ref 11.5–15.5)
WBC: 24.5 10*3/uL — ABNORMAL HIGH (ref 4.0–10.5)
nRBC: 0.2 % (ref 0.0–0.2)

## 2019-11-13 LAB — PROTIME-INR
INR: 1.2 (ref 0.8–1.2)
Prothrombin Time: 14.3 seconds (ref 11.4–15.2)

## 2019-11-13 LAB — GLUCOSE, CAPILLARY
Glucose-Capillary: 62 mg/dL — ABNORMAL LOW (ref 70–99)
Glucose-Capillary: 63 mg/dL — ABNORMAL LOW (ref 70–99)
Glucose-Capillary: 97 mg/dL (ref 70–99)
Glucose-Capillary: 118 mg/dL — ABNORMAL HIGH (ref 70–99)
Glucose-Capillary: 112 mg/dL — ABNORMAL HIGH (ref 70–99)
Glucose-Capillary: 171 mg/dL — ABNORMAL HIGH (ref 70–99)

## 2019-11-13 LAB — MAGNESIUM: Magnesium: 1.7 mg/dL (ref 1.7–2.4)

## 2019-11-13 LAB — PROCALCITONIN: Procalcitonin: 3.43 ng/mL

## 2019-11-13 LAB — APTT: aPTT: 34 seconds (ref 24–36)

## 2019-11-13 MED ORDER — GLUCOSE 40 % PO GEL
ORAL | Status: AC
  Filled 2019-11-13: qty 1

## 2019-11-13 MED ORDER — GUAIFENESIN 100 MG/5ML PO SOLN
5.0000 mL | ORAL | Status: DC | PRN
  Administered 2019-11-13 – 2019-11-17 (×7): 100 mg via ORAL
  Filled 2019-11-13 (×7): qty 5

## 2019-11-13 MED ORDER — GLUCOSE 40 % PO GEL
ORAL | Status: AC
  Administered 2019-11-13: 37.5 g
  Filled 2019-11-13: qty 1

## 2019-11-13 NOTE — Evaluation (Signed)
Occupational Therapy Evaluation Patient Details Name: Rachel Chaney MRN: [id number] DOB: 08-18-1960 Today's Date: 11/13/2019    History of Present Illness Pt is a 59 y/o F admitted on 11/2 for seizure-like activity at home that consited of jerking motions, vomiting, and then going limp. Pt was found to have L > R pneumonia. Pt then had another episode on 11/3 of whole body shaking and eve deviation that lasted about 30 seconds. PMH includes ESRD on HD, DM II, macrocytosis anemia, HLD, HTN, GERD, and paroxysmal afib, Rt transmet   Clinical Impression   PTA patient reports independent with ADLs, mobility using cane/walker as needed, limited IADLs (not driving). Admitted for above and limited by problem list below, including impaired balance, decreased activity tolerance and generalized weakness.  Patient currently requires min guard for transfers and in room mobility, min assist- supervision for ADLs.  VSS on 4L supplemental O2 via [place], fatigues quickly.  She will benefit from continued OT services while admitted and after dc at [facility] level to optimize independence and safety with ADLs, mobility. Will follow acutely.     Follow Up Recommendations  Home health OT;Supervision/Assistance - 24 hour    Equipment Recommendations  3 in 1 bedside commode    Recommendations for Other Services       Precautions / Restrictions Precautions Precautions: Fall Restrictions Weight Bearing Restrictions: No      Mobility Bed Mobility Overal bed mobility: Modified Independent             General bed mobility comments: HOB elevated but no assist required     Transfers Overall transfer level: Needs assistance Equipment used: Rolling walker (2 wheeled) Transfers: Sit to/from Stand Sit to Stand: Min guard         General transfer comment: min guard for lines and safety, cueing for hand placement     Balance Overall balance assessment: Needs assistance Sitting-balance support: No upper  extremity supported;Feet supported Sitting balance-Leahy Scale: Good     Standing balance support: Bilateral upper extremity supported;No upper extremity supported;During functional activity Standing balance-Leahy Scale: Fair Standing balance comment: preference to BUE support in standing                            ADL either performed or assessed with clinical judgement   ADL Overall ADL's : Needs assistance/impaired     Grooming: Set up;Sitting   Upper Body Bathing: Set up;Sitting;Supervision/ safety   Lower Body Bathing: Min guard;Sit to/from stand   Upper Body Dressing : Supervision/safety;Set up;Sitting   Lower Body Dressing: Minimal assistance;Sit to/from stand   Toilet Transfer: Min guard;Ambulation;RW Toilet Transfer Details (indicate cue type and reason): simulated to recliner         Functional mobility during ADLs: Min guard;Rolling walker General ADL Comments: patient limited by decreased activity tolerance and endurance, VSS during session      Vision         Perception     Praxis      Pertinent Vitals/Pain Pain Assessment: No/denies pain     Hand Dominance Right   Extremity/Trunk Assessment Upper Extremity Assessment Upper Extremity Assessment: Generalized weakness   Lower Extremity Assessment Lower Extremity Assessment: Defer to PT evaluation   Cervical / Trunk Assessment Cervical / Trunk Assessment: Kyphotic   Communication Communication Communication: No difficulties   Cognition Arousal/Alertness: Awake/alert Behavior During Therapy: WFL for tasks assessed/performed Overall Cognitive Status: Within Functional Limits for tasks assessed  General Comments  VSS during session, on 4L [place]      Exercises     Shoulder Instructions      Home Living Family/patient expects to be discharged to:: Private residence Living Arrangements: Spouse/significant other Available Help at  Discharge: Family;Available 24 hours/day Type of Home: House Home Access: Stairs to enter [employer] of Steps: 3 Entrance Stairs-Rails: Right Home Layout: One level     Bathroom Shower/Tub: Teacher, early years/pre: Standard     Home Equipment: Environmental consultant - 2 wheels;Cane - single point;Grab bars - tub/shower;Wheelchair - manual          Prior Functioning/Environment Level of Independence: Independent                 OT Problem List: Decreased strength;Decreased activity tolerance;Impaired balance (sitting and/or standing);Decreased knowledge of use of DME or AE;Decreased knowledge of precautions;Obesity      OT Treatment/Interventions: Self-care/ADL training;Energy conservation;DME and/or AE instruction;Therapeutic activities;Patient/family education;Balance training;Therapeutic exercise    OT Goals(Current goals can be found in the care plan section) Acute Rehab OT Goals Patient Stated Goal: return home OT Goal Formulation: With patient Time For Goal Achievement: 11/27/19 Potential to Achieve Goals: Good  OT Frequency: Min 2X/week   Barriers to D/C:            Co-evaluation              AM-PAC OT "6 Clicks" Daily Activity     Outcome Measure Help from another person eating meals?: None Help from another person taking care of personal grooming?: A Little Help from another person toileting, which includes using toliet, bedpan, or urinal?: A Little Help from another person bathing (including washing, rinsing, drying)?: A Little Help from another person to put on and taking off regular upper body clothing?: A Little Help from another person to put on and taking off regular lower body clothing?: A Little 6 Click Score: 19   End of Session Equipment Utilized During Treatment: Rolling walker;Oxygen (4L) Nurse Communication: Mobility status  Activity Tolerance: Patient tolerated treatment well Patient left: in chair;with call bell/phone  within reach;with chair alarm set;with nursing/sitter in room  OT Visit Diagnosis: Other abnormalities of gait and mobility (R26.89);Muscle weakness (generalized) (M62.81)                Time: 8416-6063 OT Time Calculation (min): 15 min Charges:  OT General Charges $OT Visit: 1 Visit OT Evaluation $OT Eval Moderate Complexity: 1 Mod  Jolaine Artist, OT Acute Rehabilitation Services Pager [phone] Office [phone]   Delight Stare 11/13/2019, 1:00 PM

## 2019-11-13 NOTE — Evaluation (Signed)
Physical Therapy Evaluation Patient Details Name: Rachel Chaney MRN: [id number] DOB: [birth year] Today's Date: 11/13/2019   History of Present Illness  Pt is a 59 y/o F admitted on 11/2 for seizure-like activity at home that consited of jerking motions, vomiting, and then going limp. Pt was found to have L > R pneumonia. Pt then had another episode on 11/3 of whole body shaking and eve deviation that lasted about 30 seconds. PMH includes ESRD on HD, DM II, macrocytosis anemia, HLD, HTN, GERD, and paroxysmal afib, Rt transmet  Clinical Impression  Pt pleasant and willing to mobilize. Pt on 3L throughout session with sats 95-100% during basic transfers without pulse ox reading during gait despite adjusting probe. Pt with report of dizziness limiting gait with noted hypoglycemia this am. Pt with decreased transfers, gait and activity tolerance who will benefit from acute therapy to maximize mobility and independence to decrease burden of care.   HR 86-87    Follow Up Recommendations Home health PT    Equipment Recommendations  None recommended by PT    Recommendations for Other Services       Precautions / Restrictions Precautions Precautions: Fall Restrictions Weight Bearing Restrictions: No      Mobility  Bed Mobility Overal bed mobility: Modified Independent                  Transfers Overall transfer level: Needs assistance   Transfers: Sit to/from Stand Sit to Stand: Supervision         General transfer comment: supervision for lines and safety  Ambulation/Gait Ambulation/Gait assistance: Min guard Gait Distance (Feet): 70 Feet Assistive device: Rolling walker (2 wheeled) Gait Pattern/deviations: Step-through pattern;Decreased stride length   Gait velocity interpretation: 1.31 - 2.62 ft/sec, indicative of limited community ambulator General Gait Details: pt with use of RW and slight unsteadiness with gait, limited by dizziness with SpO2 95-100% on  3L  Stairs            Wheelchair Mobility    Modified Rankin (Stroke Patients Only)       Balance Overall balance assessment: Needs assistance   Sitting balance-Leahy Scale: Good       Standing balance-Leahy Scale: Fair                               Pertinent Vitals/Pain Pain Assessment: No/denies pain    Home Living Family/patient expects to be discharged to:: Private residence Living Arrangements: Spouse/significant other Available Help at Discharge: Family;Available 24 hours/day Type of Home: House Home Access: Stairs to enter Entrance Stairs-Rails: Right Entrance Stairs-Number of Steps: 3 Home Layout: One level Home Equipment: Walker - 2 wheels;Cane - single point;Grab bars - tub/shower;Wheelchair - manual      Prior Function Level of Independence: Independent               Hand Dominance        Extremity/Trunk Assessment   Upper Extremity Assessment Upper Extremity Assessment: Generalized weakness    Lower Extremity Assessment Lower Extremity Assessment: Generalized weakness    Cervical / Trunk Assessment Cervical / Trunk Assessment: Kyphotic  Communication   Communication: No difficulties  Cognition Arousal/Alertness: Awake/alert Behavior During Therapy: WFL for tasks assessed/performed Overall Cognitive Status: Within Functional Limits for tasks assessed  General Comments      Exercises     Assessment/Plan    PT Assessment Patient needs continued PT services  PT Problem List Decreased strength;Decreased mobility;Decreased activity tolerance;Decreased balance;Decreased knowledge of use of DME;Cardiopulmonary status limiting activity       PT Treatment Interventions Gait training;Stair training;Functional mobility training;Therapeutic activities;Patient/family education;Therapeutic exercise;DME instruction    PT Goals (Current goals can be found in the Care  Plan section)  Acute Rehab PT Goals Patient Stated Goal: return home PT Goal Formulation: With patient Time For Goal Achievement: 11/27/19 Potential to Achieve Goals: Good    Frequency Min 3X/week   Barriers to discharge        Co-evaluation               AM-PAC PT "6 Clicks" Mobility  Outcome Measure Help needed turning from your back to your side while in a flat bed without using bedrails?: None Help needed moving from lying on your back to sitting on the side of a flat bed without using bedrails?: None Help needed moving to and from a bed to a chair (including a wheelchair)?: A Little Help needed standing up from a chair using your arms (e.g., wheelchair or bedside chair)?: A Little Help needed to walk in hospital room?: A Little Help needed climbing 3-5 steps with a railing? : A Little 6 Click Score: 20    End of Session Equipment Utilized During Treatment: Gait belt Activity Tolerance: Patient tolerated treatment well Patient left: in chair;with call bell/phone within reach;with chair alarm set;with nursing/sitter in room Nurse Communication: Mobility status PT Visit Diagnosis: Other abnormalities of gait and mobility (R26.89);Difficulty in walking, not elsewhere classified (R26.2)    Time: 2423-5361 PT Time Calculation (min) (ACUTE ONLY): 26 min   Charges:   PT Evaluation $PT Eval Moderate Complexity: 1 Mod PT Treatments $Therapeutic Activity: 8-22 mins        [place] Dalporto P, PT Acute Rehabilitation Services Pager: [phone] Office: [phone]   Denetta Fei B Martita Brumm 11/13/2019, 10:06 AM

## 2019-11-13 NOTE — Plan of Care (Signed)

## 2019-11-13 NOTE — Progress Notes (Signed)
Pt currently off BIPAP and is doing well at this time. WOB is WNL. RT to continue to monitor.

## 2019-11-13 NOTE — Progress Notes (Signed)
   11/13/19 2214  Provider Notification  Provider Name/Title Oletta Darter  Date Provider Notified 11/13/19  Time Provider Notified 2214  Notification Type Page Warren Lacy)  Notification Reason Other (Comment) (bleeding noted with BM)  Response No new orders  Date of Provider Response 11/13/19  Time of Provider Response 2217  noted blood on pad prior to getting up to use bedside commode. Patient had soft bowel movement with noted blood in commode. No blood noted in actual stool. Stool soft,  green/brown. Patient denies having hemorroids, denies pain, or itching. No hemorroids visualized near rectum with assessment.

## 2019-11-13 NOTE — Progress Notes (Signed)
[facility] Progress Note  Subjective: seen in room, looks much better, no c/o today, on nasal Cann O2  Vitals:   11/13/19 0900 11/13/19 1000 11/13/19 1015 11/13/19 1100  BP: 121/63 132/78  92/73  Pulse: 80 (!) 162  79  Resp: 19 13  14   Temp:      TempSrc:      SpO2: 94% (!) 83% (!) 82% 100%  Weight:      Height:        Exam:  seen in ICU, up in bed, alert and pleasant, [place] O2  no jvd  Chest - mostly clear bilat  Cor reg no RG  Abd soft ntnd no ascites   Ext 1+ pretib edema   Neuro nonfocal , Ox 3    Home meds:   - asa / lipitor/ hydralazine 25 bid/ ntg patch qd/ trental 400 tid  - velphoro 1 gm tid ac/ auryxia 420mg  tid ac/  sensipar 30 tiw  - insulin levemir bid   CXR 11/3 - bibasilar infiltrates, vasc congestion     [facility] M[facility]   3.5h  450/800  100kg (just lowered) P4  LUE AVF 15g  Hep 8000 hectorol 13mcg IVP q HD sensipar 30mg  PO q HD   Assessment/Plan: 1.  Acute resp failure / pneumonia / possible vol overload- in resp distress in ED, sig improved today. 2L off w/ HD yest, on IV abx for PNA and wbc down today.  2.  ESRD:  [facility] MWF. HD tomorrow on schedule.  3.  Hypertension/volume: up 4kg by wts today, exam +/- vol excess, goal UF 3-4 w/ HD tomorrow.  4.  Anemia ckd - Hb 12, not on esa as OP 5.  Metabolic bone disease/ ckd - cont hect, sensipar, velphoro/ Verlee Rossetti 11/13/2019, 1:16 PM   Recent Labs  Lab 11/12/19 1800 11/13/19 0042  K 4.2 3.6  BUN 48* 18  CREATININE 10.40* 6.15*  CALCIUM 8.5* 8.9  PHOS 11.5*  --   HGB 11.1* 10.6*   Inpatient medications: . aspirin EC  81 mg Oral Daily  . atorvastatin  80 mg Oral Daily  . Chlorhexidine Gluconate Cloth  6 each Topical Q0600  . cinacalcet  30 mg Oral Q M,W,F-HD  . doxercalciferol  11 mcg Intravenous Q M,W,F-HD  . ferric citrate  420 mg Oral TID WC  . heparin  5,000 Units Subcutaneous Q8H  . insulin aspart  0-15 Units Subcutaneous TID WC  .  sucroferric oxyhydroxide  1,000 mg Oral TID WC    acetaminophen, docusate sodium, guaiFENesin, heparin, polyethylene glycol

## 2019-11-13 NOTE — Progress Notes (Signed)
Hypoglycemic Event  CBG: 62   Treatment: 8 oz juice/soda  Symptoms: None  Follow-up CBG: [id number] CBG Result:63  Possible Reasons for Event: Inadequate meal intake      Berna Spare

## 2019-11-13 NOTE — Progress Notes (Signed)
Hypoglycemic Event  CBG: 63  Treatment: Patient ate breakfast   Symptoms: None  Follow-up CBG: [id number] CBG Result:92  Possible Reasons for Event: Inadequate meal intake    Berna Spare

## 2019-11-13 NOTE — Consult Note (Addendum)
Rachel Chaney, MRN:  [id number], DOB:  01-21-1960, LOS: 2 ADMISSION DATE:  11/11/2019, CONSULTATION DATE:  11/13/19 REFERRING MD:  Teaching service, CHIEF COMPLAINT:  AMS   Brief History   59 y.o. F with PMH of CAD, HFpEF, ESRD on dialysis, DM, HTN and HL who presented 11/2 after concern for AMS and seizure activity.  Found to have likely bilateral PNA and hypercapnia.  She was placed on Bipap and admitted. PCCM consulted for worsening mental status   History of present illness   Ms. Rachel Chaney is a 59 y.o. F with PMH CAD, HFpEF, ESRD on dialysis, DM, HTN and HL who presented with AMS and possible seizure activity.  Per her husband, she has been somnolent in the daytime for several months with chronic cough, but otherwise no fevers, chills, history of seizures, travel, CP, n/v or diarrhea.  She has not missed any dialysis sessions.  The morning of 11/2 husband says she was sitting on the couch dozing off as she often does when she leaned forward and vomited, slumped back unresponsive and then had generalized shaking for about 10 minutes.  He called EMS who reported multiple episodes of vomiting en route.   In the ED, patient initially had a GCS of 12 and was hypoxic on RA in the 70%'s.  This improved with non-rebreather, no hypotension, fever or tachycardia.   CXR concerning for bibasilar infiltrates and initial WBC 22k, RVP and CT head negative, ABG with hypercapneic respiratory failure.  Neurology felt seizure was likely provoked and recommended EEG and MRI.   She was admitted to the family medicine teaching service and placed on Bipap.   ABG improving the AM of 11/3 but mental status worsening and patient minimally responsive, so PCCM consulted  Past Medical History   has a past medical history of Cardiac arrest ([place]) (02/28/2018), CHF (congestive heart failure) ([place]), Chronic kidney disease due to diabetes mellitus ([place]) (10/13/2013), Diabetes mellitus, ESRD (end stage renal disease) ([place]),  Hyperlipidemia, Hypertension, and Neuropathy.   Significant Hospital Events   11/2 Admit to teaching service 11/3 worsening mental status, PCCM consult>ICU txfr   Consults:  Nephrology Neurology PCCM  Procedures:    Significant Diagnostic Tests:  11/2 CXR>>Stable bibasilar opacities concerning for pneumonia. 11/2 CT head>>no acute process, scattered hypodensities consistent with chronic small vessel dz  Micro Data:  11/2 covid-19 and flu>>negative 11/3 BCx2>> 11/3 sputum culture>>  Antimicrobials:  Cefepime 11/1 Vancomycin 11/1  Interim history/subjective:  Patient off BiPAP. Pt denies pain and reports significant improvement in her sx.   Objective   Blood pressure (!) 95/50, pulse 83, temperature 97.8 F (36.6 C), temperature source Oral, resp. rate (!) 23, height _0  (1.626 m), weight 103.9 kg, last menstrual period 09/18/2011, SpO2 100 %.    Vent Mode: BIPAP;PCV FiO2 (%):  [50 %] 50 % Set Rate:  [30 bmp] 30 bmp PEEP:  [8 cmH20] 8 cmH20   Intake/Output Summary (Last 24 hours) at 11/13/2019 0736 Last data filed at 11/13/2019 0600 Gross per 24 hour  Intake 1693.21 ml  Output 2000 ml  Net -306.79 ml   Filed Weights   11/12/19 1515 11/12/19 1848 11/13/19 0500  Weight: 105.7 kg 103.9 kg 103.9 kg   PE: GEN: sitting upright in chair eating breakfast   CV: tachycardic, regular rhythm, no JVP RESP: no increased work of breathing, clear to ascultation bilaterally with no crackles, wheezes, or rhonchi, 3L [place] ABD: Bowel sounds present. Soft, nontender, nondistended.  MSK: no  LE edema, right transmetatarsal amputation  SKIN: warm, dry NEURO: alert and oriented x4, normal speech, moves all extremities appropriately   Resolved Hospital Problem list     Assessment & Plan:   Acute Encephalopathy  Resolved. Patient A&Ox4 this morning. CT Head was negative. EEG was negative for seizure. Sx likely 2/2 to hypercapnia.  Plan Patient stable for the floor.   Acute  Hypoxemic and Hypercarbic respiratory failure Likely secondary to acute pulmonary edema. Improved with Bipap Initially started on broad spectrum abx for CAP- CXR reviewed and consistent with acute pulmonary edema, symptoms improved with dialysis and volume removal.  BNP elevated. No fevers, no focal consolidation.  Plan -Wean to home O2 requirement  -Obtain ABG if somnolence returns  - Discontinue antibiotics  - blood cultures : NGTD    ESRD Pt has been on dialysis for 10 years (MWF). States her dry weight is ~200 lb. Denies missing any dialysis session or having truncated sessions. Patient had HD yesterday.   Plan Nephrology following, HD per nephro, patient apparently 3-4kg over dry weight.   T2DM; uncontrolled  Patient uses 90u (50u AM, 40u PM) Levemir at home. A1c this admission 9.0. Started on Levemir 25 mg BID. Patient had hypoglycemic event and started on D10 gtt. Patient required glucose gel this morning (CBG 63). She was advanced to CLD last night. Patient tolerating po.   Plan  Wean D10 Advance diet to heart healthy carb modified  Hold Levemir restart when sugars improved.   SSI  Monitor CBGs   Acute decompensated HFpEF HTN, HLD  Plan HD per nephro for volume overload  Continue holding home metoprolol and hydralazine for now, resume as able Continue statin and ASA  Macrocytic Anemia  B12 and Folate normal on admission. Patient denies alcohol use. Hemoglobin 10.6 from  Plan  Monitor with CBC   Best practice:  Diet: heart healthy carb modified diet  Pain/Anxiety/Delirium protocol (if indicated): n/a VAP protocol (if indicated): n/a DVT prophylaxis: SQ heparin GI prophylaxis: n/a Glucose control: SSI Levemir Mobility: bed rest Code Status: full code Family Communication: Pt's husband updated Disposition: transfer to med-tele  Labs   CBC: Recent Labs  Lab 11/11/19 1822 11/12/19 0436 11/12/19 0511 11/12/19 0825 11/12/19 0948 11/12/19 1800 11/13/19 0042   WBC 22.9*  --  38.3*  --   --  27.9* 24.5*  NEUTROABS 18.8*  --   --   --   --   --   --   HGB 12.7   < > 12.8 13.9 13.6 11.1* 10.6*  HCT 41.8   < > 43.5 41.0 40.0 36.2 34.4*  MCV 110.3*  --  113.6*  --   --  109.0* 111.0*  PLT 274  --  244  --   --  217 175   < > = values in this interval not displayed.    Basic Metabolic Panel: Recent Labs  Lab 11/11/19 1822 11/12/19 0436 11/12/19 0511 11/12/19 0825 11/12/19 0948 11/12/19 1143 11/12/19 1800 11/13/19 0042  NA 136   < > 135 137 136  --  138 139  K 3.8   < > 4.8 4.3 4.2  --  4.2 3.6  CL 90*  --  92*  --   --   --  93* 97*  CO2 27  --  22  --   --   --  23 27  GLUCOSE 308*  --  271*  --   --   --  75 84  BUN 39*  --  46*  --   --   --  48* 18  CREATININE 9.64*  --  9.79*  --   --   --  10.40* 6.15*  CALCIUM 9.0  --  8.7*  --   --   --  8.5* 8.9  MG  --   --   --   --   --  2.0  --  1.7  PHOS  --   --   --   --   --   --  11.5*  --    < > = values in this interval not displayed.   GFR: Estimated Creatinine Clearance: 11.4 mL/min (A) (by C-G formula based on SCr of 6.15 mg/dL (H)). Recent Labs  Lab 11/11/19 1822 11/12/19 0511 11/12/19 1143 11/12/19 1558 11/12/19 1800 11/13/19 0042 11/13/19 0228  PROCALCITON  --   --  2.90  --   --   --  3.43  WBC 22.9* 38.3*  --   --  27.9* 24.5*  --   LATICACIDVEN  --   --  2.3* 0.9  --   --   --     Liver Function Tests: Recent Labs  Lab 11/11/19 1822 11/12/19 0511 11/12/19 1800 11/13/19 0042  AST 23 23  --  18  ALT 28 24  --  20  ALKPHOS 93 89  --  57  BILITOT 1.0 1.2  --  1.0  PROT 6.9 7.1  --  6.0*  ALBUMIN 3.1* 3.0* 2.7* 3.1*   Recent Labs  Lab 11/11/19 1822  LIPASE 24   Recent Labs  Lab 11/12/19 1143  AMMONIA 45*    ABG    Component Value Date/Time   PHART 7.447 11/12/2019 0948   PCO2ART 38.9 11/12/2019 0948   PO2ART 91 11/12/2019 0948   HCO3 26.8 11/12/2019 0948   TCO2 28 11/12/2019 0948   ACIDBASEDEF 2.1 (H) 11/12/2019 0125   O2SAT 97.0  11/12/2019 0948     Coagulation Profile: No results for input(s): INR, PROTIME in the last 168 hours.  Cardiac Enzymes: No results for input(s): CKTOTAL, CKMB, CKMBINDEX, TROPONINI in the last 168 hours.  HbA1C: HbA1c, POC (controlled diabetic range)  Date/Time Value Ref Range Status  03/11/2019 10:20 AM 7.7 (A) 0.0 - 7.0 % Final  08/23/2017 08:43 AM 9.1 (A) 0.0 - 7.0 % Final   Hgb A1c MFr Bld  Date/Time Value Ref Range Status  11/12/2019 05:10 AM 9.0 (H) 4.8 - 5.6 % Final    Comment:    (NOTE) Pre diabetes:          5.7%-6.4%  Diabetes:              >6.4%  Glycemic control for   <7.0% adults with diabetes   02/26/2018 03:48 AM 11.4 (H) 4.8 - 5.6 % Final    Comment:    (NOTE)         Prediabetes: 5.7 - 6.4         Diabetes: >6.4         Glycemic control for adults with diabetes: <7.0     CBG: Recent Labs  Lab 11/12/19 1135 11/12/19 1539 11/12/19 1923 11/12/19 2118 11/13/19 0707  GLUCAP 128* 85 78 87 62*    Review of Systems:   Unable to obtain secondary to encephalopathy  Past Medical History  She,  has a past medical history of Cardiac arrest ([place]) (02/28/2018), CHF (congestive heart failure) ([place]), Chronic kidney disease due  to diabetes mellitus ([place]) (10/13/2013), Diabetes mellitus, ESRD (end stage renal disease) ([place]), Hyperlipidemia, Hypertension, and Neuropathy.   Surgical History    Past Surgical History:  Procedure Laterality Date  . AMPUTATION Right 02/27/2018   Procedure: RIGHT FOOT 2ND AND 3RD RAY AMPUTATION;  Surgeon: [name], MD;  Location: [facility];  Service: Orthopedics;  Laterality: Right;  . AMPUTATION Right 04/24/2018   Procedure: RIGHT TRANSMETATARSAL AMPUTATION, PLACE VAC;  Surgeon: [name], MD;  Location: [facility];  Service: Orthopedics;  Laterality: Right;  . AV FISTULA PLACEMENT Left 04/15/2015   Procedure: ARTERIOVENOUS (AV) FISTULA CREATION;  Surgeon: Mal Misty, MD;  Location: [facility];  Service: Vascular;  Laterality: Left;    . EYE SURGERY Right    retinal detachment  . FISTULA SUPERFICIALIZATION Left 06/03/2015   Procedure: LEFT UPPER ARM BRACHIOCEPHALIC FISTULA SUPERFICIALIZATION;  Surgeon: Mal Misty, MD;  Location: [facility];  Service: Vascular;  Laterality: Left;  from [place] to [facility]  . STUMP REVISION Right 05/24/2018   Procedure: REVISION RIGHT TRANSMETATARSAL AMPUTATION WITH EXCISION BONE/SOFT TISSUE, LOCAL TISSUE  RE-ARRANGEMENT  AND VAC PLACEMENT;  Surgeon: [name], MD;  Location: [facility];  Service: Orthopedics;  Laterality: Right;     Social History   reports that she has never smoked. She has never used smokeless tobacco. She reports that she does not drink alcohol and does not use drugs.   Family History   Her family history includes Heart disease in her father and mother.   Allergies No Known Allergies   Home Medications  Prior to Admission medications   Medication Sig Start Date End Date Taking? Authorizing Provider  acetaminophen (TYLENOL) 500 MG tablet Take 1,000 mg by mouth every 6 (six) hours as needed for pain.    [provider]  aspirin EC 81 MG tablet Take 1 tablet (81 mg total) by mouth daily. 04/30/18   Erlene Quan, PA-C  atorvastatin (LIPITOR) 80 MG tablet TAKE 1 TABLET(80 MG) BY MOUTH AT BEDTIME Patient taking differently: Take 80 mg by mouth at bedtime.  11/04/19   Benay Pike, MD  Blood Glucose Monitoring Suppl (ONE TOUCH ULTRA 2) w/Device KIT 1 kit by Does not apply route 3 (three) times daily. ICD-10 code: E11.40 03/26/18   Wille Celeste, PA-C  cadexomer iodine (IODOSORB) 0.9 % gel Apply to left 5th digit wounds once daily 07/22/19   Marzetta Board, DPM  carvedilol (COREG) 12.5 MG tablet Take 12.5 mg by mouth 2 (two) times daily. 10/13/19   [provider]  cetirizine (ZYRTEC) 10 MG tablet Take by mouth. 04/26/18   [provider]  cyclobenzaprine (FLEXERIL) 10 MG tablet Take 1 tablet (10 mg total) by mouth 3 times/day as needed-between  meals & bedtime for muscle spasms. 06/16/19   Benay Pike, MD  Etelcalcetide HCl (PARSABIV IV) Etelcalcetide Hermina Staggers) 02/10/19 02/09/20  [provider]  ferric citrate (AURYXIA) 1 GM 210 MG(Fe) tablet Take 2 tablets (420 mg total) by mouth 3 (three) times daily with meals. Give two (420 mg) by mouth three times a day wih meals for ESRD Patient taking differently: Take 210-420 mg by mouth See admin instructions. Take 420 mg with each meal and 210 mg with snacks 03/26/18   Lassen, Arlo C, PA-C  fluticasone (FLONASE) 50 MCG/ACT nasal spray Place 1 spray into both nostrils daily. 06/16/19   Benay Pike, MD  glucose blood (ONE TOUCH ULTRA TEST) test strip 1 each by Other  route 3 (three) times daily. ICD-10 code: E11.40. 03/26/18   Wille Celeste, PA-C  hydrALAZINE (APRESOLINE) 25 MG tablet Take 25 mg by mouth 2 (two) times a day.    [provider]  hydrOXYzine (ATARAX/VISTARIL) 25 MG tablet Take 25 mg by mouth 2 (two) times daily as needed for anxiety.  10/01/19   [provider]  insulin detemir (LEVEMIR) 100 UNIT/ML FlexPen 50U in the am and 40U in pm 08/20/19   Benay Pike, MD  Insulin Pen Needle (B-D UF III MINI PEN NEEDLES) 31G X 5 MM MISC USE AS DIRECTED TWICE DAILY 04/30/19   Benay Pike, MD  Insulin Pen Needle 29G X 12MM MISC Inject 1 pen into the skin 2 (two) times daily. Check blood sugar daily. 03/26/18   Granville Lewis C, PA-C  Insulin Syringe-Needle U-100 Flossie Buffy INSULIN SYRINGE) 30G X 5/16" 1 ML MISC USE AS DIRECTED 03/26/18   Granville Lewis C, PA-C  Methoxy PEG-Epoetin Beta (MIRCERA IJ) Mircera 01/31/19 01/30/20  [provider]  multivitamin (RENA-VIT) TABS tablet Take 1 tablet by mouth daily.    [provider]  nitroGLYCERIN (NITRODUR - DOSED IN MG/24 HR) 0.2 mg/hr patch Place 1 patch (0.2 mg total) onto the skin daily. 05/23/18   Rayburn, Neta Mends, PA-C  OneTouch Delica Lancets 35T MISC 1 Device by Does not apply route as needed (to  check blood glucose). 03/26/18   Granville Lewis C, PA-C  pentoxifylline (TRENTAL) 400 MG CR tablet Take 1 tablet (400 mg total) by mouth 3 (three) times daily with meals. 05/23/18   Rayburn, Neta Mends, PA-C  sucroferric oxyhydroxide (VELPHORO) 500 MG chewable tablet Chew by mouth. 07/30/19   [provider]  VELPHORO 500 MG chewable tablet Chew 1,000 mg by mouth 3 (three) times daily. 07/30/19   [provider]  progesterone (PROMETRIUM) 200 MG capsule Take 1 capsule (200 mg total) by mouth daily. Take for 10 days if bleeding occurs for more than 4 days. 11/09/10 03/28/11  Lyndal Pulley, DO     Critical care time:       Lyndee Hensen, DO PGY-2, [facility] 11/13/2019 7:38 AM

## 2019-11-13 NOTE — Progress Notes (Signed)
eLink Physician-Brief Progress Note Patient Name: Rachel Chaney DOB: October 17, 1960 MRN: [id number]   Date of Service  11/13/2019  HPI/Events of Note  Nursing reports blood per rectum after BM. BP = 122/49 and HR = 84. Last Hgb = 10.6.   eICU Interventions  Plan: 1. CBC with platelets, PT/INR and PTT STAT.      Intervention Category Major Interventions: Other:  Lysle Dingwall 11/13/2019, 10:40 PM

## 2019-11-14 ENCOUNTER — Inpatient Hospital Stay (HOSPITAL_COMMUNITY): Payer: Medicare Other

## 2019-11-14 DIAGNOSIS — J9622 Acute and chronic respiratory failure with hypercapnia: Secondary | ICD-10-CM | POA: Diagnosis not present

## 2019-11-14 DIAGNOSIS — J9621 Acute and chronic respiratory failure with hypoxia: Secondary | ICD-10-CM | POA: Diagnosis not present

## 2019-11-14 DIAGNOSIS — J9602 Acute respiratory failure with hypercapnia: Secondary | ICD-10-CM | POA: Diagnosis not present

## 2019-11-14 LAB — COMPREHENSIVE METABOLIC PANEL
ALT: 19 U/L (ref 0–44)
AST: 22 U/L (ref 15–41)
Albumin: 2.9 g/dL — ABNORMAL LOW (ref 3.5–5.0)
Alkaline Phosphatase: 64 U/L (ref 38–126)
Anion gap: 13 (ref 5–15)
BUN: 29 mg/dL — ABNORMAL HIGH (ref 6–20)
CO2: 26 mmol/L (ref 22–32)
Calcium: 8.6 mg/dL — ABNORMAL LOW (ref 8.9–10.3)
Chloride: 94 mmol/L — ABNORMAL LOW (ref 98–111)
Creatinine, Ser: 7.84 mg/dL — ABNORMAL HIGH (ref 0.44–1.00)
GFR, Estimated: 5 mL/min — ABNORMAL LOW (ref >60)
Glucose, Bld: 284 mg/dL — ABNORMAL HIGH (ref 70–99)
Potassium: 4.1 mmol/L (ref 3.5–5.1)
Sodium: 133 mmol/L — ABNORMAL LOW (ref 135–145)
Total Bilirubin: 0.6 mg/dL (ref 0.3–1.2)
Total Protein: 6 g/dL — ABNORMAL LOW (ref 6.5–8.1)

## 2019-11-14 LAB — CBC
HCT: 36.9 % (ref 36.0–46.0)
Hemoglobin: 11.4 g/dL — ABNORMAL LOW (ref 12.0–15.0)
MCH: 34.2 pg — ABNORMAL HIGH (ref 26.0–34.0)
MCHC: 30.9 g/dL (ref 30.0–36.0)
MCV: 110.8 fL — ABNORMAL HIGH (ref 80.0–100.0)
Platelets: 183 10*3/uL (ref 150–400)
RBC: 3.33 MIL/uL — ABNORMAL LOW (ref 3.87–5.11)
RDW: 17.2 % — ABNORMAL HIGH (ref 11.5–15.5)
WBC: 17.4 10*3/uL — ABNORMAL HIGH (ref 4.0–10.5)
nRBC: 0.2 % (ref 0.0–0.2)

## 2019-11-14 LAB — MAGNESIUM: Magnesium: 1.8 mg/dL (ref 1.7–2.4)

## 2019-11-14 LAB — GLUCOSE, CAPILLARY
Glucose-Capillary: 154 mg/dL — ABNORMAL HIGH (ref 70–99)
Glucose-Capillary: 130 mg/dL — ABNORMAL HIGH (ref 70–99)
Glucose-Capillary: 93 mg/dL (ref 70–99)

## 2019-11-14 LAB — PHOSPHORUS: Phosphorus: 7.2 mg/dL — ABNORMAL HIGH (ref 2.5–4.6)

## 2019-11-14 MED ORDER — HEPARIN SODIUM (PORCINE) 1000 UNIT/ML IJ SOLN
INTRAMUSCULAR | Status: AC
  Administered 2019-11-14: 4000 [IU] via INTRAVENOUS_CENTRAL
  Filled 2019-11-14: qty 4

## 2019-11-14 MED ORDER — DOXERCALCIFEROL 4 MCG/2ML IV SOLN
INTRAVENOUS | Status: AC
  Administered 2019-11-14: 11 ug via INTRAVENOUS
  Filled 2019-11-14: qty 6

## 2019-11-14 MED ORDER — HEPARIN SODIUM (PORCINE) 1000 UNIT/ML DIALYSIS: 4000.0000 [IU] | INTRAMUSCULAR | Status: DC | PRN

## 2019-11-14 MED ORDER — SALINE SPRAY 0.65 % NA SOLN
1.0000 | NASAL | Status: DC | PRN
  Administered 2019-11-14: 1 via NASAL
  Filled 2019-11-14: qty 44

## 2019-11-14 NOTE — Progress Notes (Signed)
Family Medicine Teaching Service Daily Progress Note Intern Pager: [phone]  Patient name: Rachel Chaney Medical record number: [id number] Date of birth: 1960-04-06 Age: 59 y.o. Gender: female  Primary Care Provider: Benay Pike, MD Consultants: Tommie Raymond, Neuro Code Status: Full  Pt Overview and Major Events to Date:  Admitted 11/2 Transfer over from CCM to [place] on 11/5  Assessment and Plan: Rachel I Dorsettis a 59 y.o.femalepresenting with seizure-like activity and vomiting. PMH is significant forESRD on HDMWF, T2DM, Macrocytic anemia, HLD, HTN, GERD and H/o Paroxysmal Afib.  Acute Hypoxemic and Hypercarbic respiratory failure Likely due to fluid overload.  Patient not on any antibiotics.  No increased WOB and satting well on 3L. - Continue 3L LFNC - Continuous Pulse Ox  Acute Encephalopathy  Had repeat episode of delerium overnight. Patient A&Ox4 this morning. CT Head was negative.Thought to be due to Hypoglycemia, though no BG drawn around this time.  Neurology had MRI ordered that had not been obtained. - Continue to monitor for vitals - MRI today - PT/OT following  ESRD on HD Receives Dialysis MWF. - nephrology following, appreciate recs - Dialysis this morning  Macrocytosis Hgb- 11.4.  Normal iron, elevated ferritin, normal B12 and folate.  - AM CBC  HLD:  ASCVD risk 9.1%, patient already on Lipitor 80mg  daily.LDL 35.  -Continue home Atorva 80  Essential HTN: BPs on admission shows normal systolic with diastolic < 05L.Pt takes Carvedilol 25 mg BID and Hydralazine 25 mg BID. -Carvedilol discontinued d/t low diastolic pressures.  -Hold off Hydralazine, can add back as BP indicates  History of paroxysmal Afib:Pt takes Aspirin EC 81 mg Daily.Patient in normal sinus rhythm in the ED. EKG w/o pAF.  -Hold home ASA 81 due to new recommendation to not start as primary prevention in patient's 17+ years older -Continuous cardiac monitoring  FEN/GI: Carb  Controlled diet PPx: Heparin   Status is: Inpatient  Remains inpatient appropriate because:Altered mental status   Dispo: The patient is from: Home              Anticipated d/c is to: Home              Anticipated d/c date is: 1 day              Patient currently is not medically stable to d/c.     Subjective:  Patient doing well. Currently receiving dialysis. Has no complaints at this time.  Overnight patient had some delirium and required Mitt placement per nurse.    Objective: Temp:  [97.5 F (36.4 C)-98.5 F (36.9 C)] 97.5 F (36.4 C) (11/05 1113) Pulse Rate:  [81-84] 83 (11/05 1113) Resp:  [12-30] 14 (11/05 1128) BP: (54-168)/(28-121) 135/46 (11/05 1113) SpO2:  [95 %-100 %] 97 % (11/05 1113) Weight:  [102 kg-104.1 kg] 102 kg (11/05 1113) Physical Exam: Constitutional:      General: She is not in acute distress.    Appearance: Normal appearance. She is not ill-appearing.  HENT:     Head: Normocephalic and atraumatic.     Mouth/Throat:     Mouth: Mucous membranes are moist.  Cardiovascular:     Rate and Rhythm: Normal rate and regular rhythm.     Pulses: Normal pulses.     Heart sounds: Normal heart sounds.  Pulmonary:     Effort: Pulmonary effort is normal.     Breath sounds: Normal breath sounds.  Abdominal:     General: Abdomen is flat. There is no distension.  Palpations: Abdomen is soft.     Tenderness: There is no abdominal tenderness.  Skin:    General: Skin is warm.     Capillary Refill: Capillary refill takes less than 2 seconds.  Neurological:     Mental Status: She is alert and oriented to person, place, and time.  Psychiatric:        Mood and Affect: Mood normal.        Behavior: Behavior normal.   Laboratory: Recent Labs  Lab 11/13/19 0042 11/13/19 2249 11/14/19 0327  WBC 24.5* 17.8* 17.4*  HGB 10.6* 11.2* 11.4*  HCT 34.4* 36.6 36.9  PLT 175 187 183   Recent Labs  Lab 11/12/19 0511 11/12/19 0825 11/12/19 1800 11/13/19 0042  11/14/19 0327  NA 135   < > 138 139 133*  K 4.8   < > 4.2 3.6 4.1  CL 92*   < > 93* 97* 94*  CO2 22   < > 23 27 26   BUN 46*   < > 48* 18 29*  CREATININE 9.79*   < > 10.40* 6.15* 7.84*  CALCIUM 8.7*   < > 8.5* 8.9 8.6*  PROT 7.1  --   --  6.0* 6.0*  BILITOT 1.2  --   --  1.0 0.6  ALKPHOS 89  --   --  57 64  ALT 24  --   --  20 19  AST 23  --   --  18 22  GLUCOSE 271*   < > 75 84 284*   < > = values in this interval not displayed.     Imaging/Diagnostic Tests:  EXAM: PORTABLE CHEST 1 VIEW  COMPARISON:  11/11/2019.  FINDINGS: Heart size is normal. Aortic atherosclerotic calcifications. Decreased lung volumes with persistent bibasilar atelectasis and airspace opacities. Visualized osseous structures are intact.  IMPRESSION: Persistent bibasilar atelectasis and airspace disease.  Aortic Atherosclerosis (ICD10-I70.0).   Electronically Signed   By: Kerby Moors M.D.   On: 11/12/2019 10:02  EXAM: CT HEAD WITHOUT CONTRAST  TECHNIQUE: Contiguous axial images were obtained from the base of the skull through the vertex without intravenous contrast.  COMPARISON:  03/06/2018  FINDINGS: Brain: No acute infarct or hemorrhage. Lateral ventricles and midline structures are stable. Stable scattered hypodensities in the periventricular white matter consistent with chronic small vessel ischemic change. No acute extra-axial fluid collections. No mass effect.  Vascular: No hyperdense vessel or unexpected calcification.  Skull: Normal. Negative for fracture or focal lesion.  Sinuses/Orbits: No acute finding.  Other: None.  IMPRESSION: 1. Stable exam, no acute process.   Electronically Signed   By: Randa Ngo M.D.   On: 11/11/2019 19:37   Delora Fuel, MD 11/14/2019, 2:01 PM PGY-1, [facility] Intern pager: [phone], text pages welcome

## 2019-11-14 NOTE — Plan of Care (Signed)

## 2019-11-14 NOTE — Progress Notes (Signed)
[facility] Progress Note  Subjective: seen on HD, doing well, BP's dropping w/ 3.5 L goal  Vitals:   11/14/19 0845 11/14/19 0850 11/14/19 0855 11/14/19 0900  BP:  92/75    Pulse:      Resp: 14 20 17 19   Temp:      TempSrc:      SpO2:      Weight:      Height:        Exam:  seen in HD, nasal O2, calm , no distress  no jvd  Chest - mostly clear bilat  Cor reg no RG  Abd soft ntnd no ascites   Ext 1+ pretib edema   Neuro nonfocal , Ox 3    Home meds:   - asa / lipitor/ hydralazine 25 bid/ ntg patch qd/ trental 400 tid  - velphoro 1 gm tid ac/ auryxia 420mg  tid ac/  sensipar 30 tiw  - insulin levemir bid   CXR 11/3 - bibasilar infiltrates, vasc congestion     Dialysis:  [facility] MWF   3.5h  450/800  100kg (just lowered) P4  LUE AVF 15g  Hep 8000 hectorol 25mcg IVP q HD sensipar 30mg  PO q HD   Assessment/Plan: 1.  Acute resp failure / pneumonia / possible vol overload- much better w/ IV abx and HD. Getting IV abx for PNA and wbc down.  2.  ESRD:  HD MWF. HD today, lower goal d/t low BPs, 2.5 L goal 3.  Hypertension/volume: no vol ^ on exam, UF goal 2.5 today. Will repeat CXR.  4.  Anemia ckd - Hb 12, not on esa as OP 5.  Metabolic bone disease/ ckd - cont hect, sensipar, velphoro/ Rachel Chaney 11/14/2019, 9:52 AM   Recent Labs  Lab 11/12/19 1800 11/12/19 1800 11/13/19 0042 11/13/19 0042 11/13/19 2249 11/14/19 0327  K 4.2   < > 3.6  --   --  4.1  BUN 48*   < > 18  --   --  29*  CREATININE 10.40*   < > 6.15*  --   --  7.84*  CALCIUM 8.5*   < > 8.9  --   --  8.6*  PHOS 11.5*  --   --   --   --  7.2*  HGB 11.1*   < > 10.6*   < > 11.2* 11.4*   < > = values in this interval not displayed.   Inpatient medications: . aspirin EC  81 mg Oral Daily  . atorvastatin  80 mg Oral Daily  . Chlorhexidine Gluconate Cloth  6 each Topical Q0600  . cinacalcet  30 mg Oral Q M,W,F-HD  . doxercalciferol  11 mcg Intravenous Q M,W,F-HD  .  ferric citrate  420 mg Oral TID WC  . heparin  5,000 Units Subcutaneous Q8H  . insulin aspart  0-15 Units Subcutaneous TID WC  . sucroferric oxyhydroxide  1,000 mg Oral TID WC    acetaminophen, docusate sodium, guaiFENesin, heparin, [START ON 11/15/2019] heparin, polyethylene glycol

## 2019-11-14 NOTE — Progress Notes (Signed)
PROGRESS NOTE    Rachel Chaney  KDT:[id number] DOB: 09/09/1960 DOA: 11/11/2019 PCP: Benay Pike, MD   Chief Complain: Confusion  Brief Narrative: Patient is a 59 year old female with history of coronary artery disease, diastolic congestive heart failure, ESRD on dialysis, diabetes type 2, hypertension, hyperlipidemia who presented on 11/2 with concern for altered mental status, seizure activity.  She was noted to be somnolent at home with cough.  She became unresponsive at home.  She had multiple episodes of vomiting while en route to the emergency department.  On presentation, she was suspected to have bilateral pneumonia with hypercapnia but it turned out to be pulmonary edema.  She was hypoxic on presentation.  She was placed on BiPAP and was admitted under PCCM service.  Patient transferred to [facility] service on 11/14/2019.  Assessment & Plan:   Active Problems:   ESRD (end stage renal disease) on dialysis [facility])   Community acquired bilateral lower lobe pneumonia   Respiratory failure with hypoxia and hypercapnia (HCC)   Hypercapnic respiratory failure (HCC)   Acute encephalopathy: Thought to be secondary to hypercapnia on presentation.  Mental status has improved and she is alert and oriented this morning during my evaluation..  CT head did not show any acute intracranial maladies.  EEG did not show any seizures.   Acute hypoxic/hypercarbic respiratory failure: Secondary to acute pulmonary edema from volume overload from ESRD.  Improved with BiPAP.  Also suspected to have bilateral pneumonia on presentation and started on antibiotics which have been stopped. Currently on 3 L of oxygen per minute.  Not on oxygen at home.  Elevated BNP. We will continue to try to wean the oxygen  ESRD on dialysis: On dialysis since last 10 years.  Dialyzed on Monday, Wednesday and Friday.  Nephrology following for dialysis.  Has fistula on the left upper extremity.  Has hyperphosphatemia.  Continue  senacalcet .  Uncontrolled diabetes type 2: On insulin at home.  Hemoglobin A1c of 9.  Continue current insulin regimen.  Continue to monitor blood sugars.  Acute exacerbation of diastolic congestive heart failure: Secondary to renal disease.  Elevated BNP.  Continue volume  management as per dialysis by nephrology.  Hypertension: On metoprolol and hydralazine at home.  Blood pressure was soft during this hospitalization.  These medication on hold.  Continue to monitor blood pressure.  Hyperlipidemia: On statin.  Macrocytic anemia: Vitamin B12 and folate normal.  No history of alcohol use.  Monitor CBC.  Currently hemoglobin is stable  Leukocytosis: Likely reactive.  Continue to monitor.  No indication for antibiotic therapy for now.  Debility/deconditioning: PT/OT recommended home health  Looks like the patient is from family medicine service.  Patient can be transferred to family medicine service when appropriate.         DVT prophylaxis: Heparin [place] Code Status:Full  Family Communication: None at bedside Status is: Inpatient  Remains inpatient appropriate because:Inpatient level of care appropriate due to severity of illness   Dispo: The patient is from: Home              Anticipated d/c is to: Home              Anticipated d/c date is:1-2 days              Patient currently is not medically stable to d/c.  Still on supplemental oxygen.    Consultants: Nephrology, PCCM, neurology Procedures: None  Antimicrobials:  Anti-infectives (From admission, onward)   Start  Dose/Rate Route Frequency Ordered Stop   11/12/19 1600  vancomycin (VANCOCIN) IVPB 1000 mg/200 mL premix  Status:  Discontinued        1,000 mg 200 mL/hr over 60 Minutes Intravenous Every M-W-F (Hemodialysis) 11/11/19 2027 11/13/19 0946   11/12/19 1330  azithromycin (ZITHROMAX) 500 mg in sodium chloride 0.9 % 250 mL IVPB  Status:  Discontinued        500 mg 250 mL/hr over 60 Minutes Intravenous Every 24  hours 11/12/19 1215 11/13/19 0946   11/12/19 1200  ceFEPIme (MAXIPIME) 2 g in sodium chloride 0.9 % 100 mL IVPB  Status:  Discontinued        2 g 200 mL/hr over 30 Minutes Intravenous Every M-W-F (Hemodialysis) 11/12/19 1125 11/13/19 0946   11/12/19 0800  azithromycin (ZITHROMAX) 500 mg in sodium chloride 0.9 % 250 mL IVPB  Status:  Discontinued        500 mg 250 mL/hr over 60 Minutes Intravenous Every 24 hours 11/12/19 0716 11/12/19 1115   11/11/19 2030  vancomycin (VANCOREADY) IVPB 2000 mg/400 mL        2,000 mg 200 mL/hr over 120 Minutes Intravenous  Once 11/11/19 2027 11/12/19 0032   11/11/19 1900  ceFEPIme (MAXIPIME) 2 g in sodium chloride 0.9 % 100 mL IVPB        2 g 200 mL/hr over 30 Minutes Intravenous  Once 11/11/19 1846 11/11/19 2058      Subjective:  Patient seen and examined at the dialysis suite this morning.  During my evaluation she was comfortable, hemodynamically stable.  She was on 3 L of oxygen per minute.  She was completely alert and oriented and denies any complaints.  Objective: Vitals:   11/13/19 2145 11/14/19 0526 11/14/19 0728 11/14/19 0733  BP: (!) 122/49 (!) 94/52    Pulse: 84 84  82  Resp: 18 18    Temp: 98.4 F (36.9 C) 98.5 F (36.9 C)  (!) 97.5 F (36.4 C)  TempSrc: Oral Oral  Oral  SpO2: 98% 97%  97%  Weight:   104.1 kg   Height:        Intake/Output Summary (Last 24 hours) at 11/14/2019 0901 Last data filed at 11/13/2019 2200 Gross per 24 hour  Intake 267.94 ml  Output --  Net 267.94 ml   Filed Weights   11/12/19 1848 11/13/19 0500 11/14/19 0728  Weight: 103.9 kg 103.9 kg 104.1 kg    Examination:  General exam: Appears calm and comfortable ,Not in distress, morbidly obese HEENT:PERRL,Oral mucosa moist, Ear/Nose normal on gross exam Respiratory system: Diminished air sounds on the bases but no no wheezes or crackles  Cardiovascular system: S1 & S2 heard, RRR. No JVD, murmurs, rubs, gallops or clicks.  Trace bilateral lower extremity   edema. Gastrointestinal system: Abdomen is nondistended, soft and nontender. No organomegaly or masses felt. Normal bowel sounds heard. Central nervous system: Alert and oriented. No focal neurological deficits. Extremities: Right transmetatarsal amputation , AV fistula on the left upper extremity skin: No rashes, lesions or ulcers,no icterus ,no pallor   Data Reviewed: I have personally reviewed following labs and imaging studies  CBC: Recent Labs  Lab 11/11/19 1822 11/12/19 0436 11/12/19 0511 11/12/19 0825 11/12/19 0948 11/12/19 1800 11/13/19 0042 11/13/19 2249 11/14/19 0327  WBC 22.9*  --  38.3*  --   --  27.9* 24.5* 17.8* 17.4*  NEUTROABS 18.8*  --   --   --   --   --   --  14.7*  --   HGB 12.7   < > 12.8   < > 13.6 11.1* 10.6* 11.2* 11.4*  HCT 41.8   < > 43.5   < > 40.0 36.2 34.4* 36.6 36.9  MCV 110.3*  --  113.6*  --   --  109.0* 111.0* 110.6* 110.8*  PLT 274  --  244  --   --  217 175 187 183   < > = values in this interval not displayed.   Basic Metabolic Panel: Recent Labs  Lab 11/11/19 1822 11/12/19 0436 11/12/19 0511 11/12/19 0511 11/12/19 0825 11/12/19 0948 11/12/19 1143 11/12/19 1800 11/13/19 0042 11/14/19 0327  NA 136   < > 135   < > 137 136  --  138 139 133*  K 3.8   < > 4.8   < > 4.3 4.2  --  4.2 3.6 4.1  CL 90*  --  92*  --   --   --   --  93* 97* 94*  CO2 27  --  22  --   --   --   --  23 27 26   GLUCOSE 308*  --  271*  --   --   --   --  75 84 284*  BUN 39*  --  46*  --   --   --   --  48* 18 29*  CREATININE 9.64*  --  9.79*  --   --   --   --  10.40* 6.15* 7.84*  CALCIUM 9.0  --  8.7*  --   --   --   --  8.5* 8.9 8.6*  MG  --   --   --   --   --   --  2.0  --  1.7 1.8  PHOS  --   --   --   --   --   --   --  11.5*  --  7.2*   < > = values in this interval not displayed.   GFR: Estimated Creatinine Clearance: 9 mL/min (A) (by C-G formula based on SCr of 7.84 mg/dL (H)). Liver Function Tests: Recent Labs  Lab 11/11/19 1822 11/12/19 0511  11/12/19 1800 11/13/19 0042 11/14/19 0327  AST 23 23  --  18 22  ALT 28 24  --  20 19  ALKPHOS 93 89  --  57 64  BILITOT 1.0 1.2  --  1.0 0.6  PROT 6.9 7.1  --  6.0* 6.0*  ALBUMIN 3.1* 3.0* 2.7* 3.1* 2.9*   Recent Labs  Lab 11/11/19 1822  LIPASE 24   Recent Labs  Lab 11/12/19 1143  AMMONIA 45*   Coagulation Profile: Recent Labs  Lab 11/13/19 2249  INR 1.2   Cardiac Enzymes: No results for input(s): CKTOTAL, CKMB, CKMBINDEX, TROPONINI in the last 168 hours. BNP (last 3 results) No results for input(s): PROBNP in the last 8760 hours. HbA1C: Recent Labs    11/12/19 0510  HGBA1C 9.0*   CBG: Recent Labs  Lab 11/13/19 0738 11/13/19 0841 11/13/19 1112 11/13/19 1513 11/13/19 2113  GLUCAP 63* 97 118* 112* 171*   Lipid Profile: Recent Labs    11/12/19 0511  CHOL 79  HDL 31*  LDLCALC 35  TRIG 65  CHOLHDL 2.5   Thyroid Function Tests: Recent Labs    11/12/19 0511  TSH 2.270   Anemia Panel: Recent Labs    11/12/19 0511  VITAMINB12 960*  FOLATE 21.3  FERRITIN 977*  TIBC 252  IRON 41  RETICCTPCT 4.8*   Sepsis Labs: Recent Labs  Lab 11/12/19 1143 11/12/19 1558 11/13/19 0228  PROCALCITON 2.90  --  3.43  LATICACIDVEN 2.3* 0.9  --     Recent Results (from the past 240 hour(s))  Respiratory Panel by RT PCR (Flu A&B, Covid) - Nasopharyngeal Swab     Status: None   Collection Time: 11/11/19  6:22 PM   Specimen: Nasopharyngeal Swab  Result Value Ref Range Status   SARS Coronavirus 2 by RT PCR NEGATIVE NEGATIVE Final    Comment: (NOTE) SARS-CoV-2 target nucleic acids are NOT DETECTED.  The SARS-CoV-2 RNA is generally detectable in upper respiratoy specimens during the acute phase of infection. The lowest concentration of SARS-CoV-2 viral copies this assay can detect is 131 copies/mL. A negative result does not preclude SARS-Cov-2 infection and should not be used as the sole basis for treatment or other patient management decisions. A negative  result may occur with  improper specimen collection/handling, submission of specimen other than nasopharyngeal swab, presence of viral mutation(s) within the areas targeted by this assay, and inadequate number of viral copies (<131 copies/mL). A negative result must be combined with clinical observations, patient history, and epidemiological information. The expected result is Negative.  Fact Sheet for Patients:  PinkCheek.be  Fact Sheet for Healthcare Providers:  GravelBags.it  This test is no t yet approved or cleared by the [place] FDA and  has been authorized for detection and/or diagnosis of SARS-CoV-2 by FDA under an Emergency Use Authorization (EUA). This EUA will remain  in effect (meaning this test can be used) for the duration of the COVID-19 declaration under Section 564(b)(1) of the Act, 21 U.S.C. section 360bbb-3(b)(1), unless the authorization is terminated or revoked sooner.     Influenza A by PCR NEGATIVE NEGATIVE Final   Influenza B by PCR NEGATIVE NEGATIVE Final    Comment: (NOTE) The Xpert Xpress SARS-CoV-2/FLU/RSV assay is intended as an aid in  the diagnosis of influenza from Nasopharyngeal swab specimens and  should not be used as a sole basis for treatment. Nasal washings and  aspirates are unacceptable for Xpert Xpress SARS-CoV-2/FLU/RSV  testing.  Fact Sheet for Patients: PinkCheek.be  Fact Sheet for Healthcare Providers: GravelBags.it  This test is not yet approved or cleared by the [place] FDA and  has been authorized for detection and/or diagnosis of SARS-CoV-2 by  FDA under an Emergency Use Authorization (EUA). This EUA will remain  in effect (meaning this test can be used) for the duration of the  Covid-19 declaration under Section 564(b)(1) of the Act, 21  U.S.C. section 360bbb-3(b)(1), unless the authorization is    terminated or revoked. Performed at [facility], [place] [address] [place], [place] [address]   Culture, blood (routine x 2)     Status: None (Preliminary result)   Collection Time: 11/12/19 11:43 AM   Specimen: BLOOD RIGHT HAND  Result Value Ref Range Status   Specimen Description BLOOD RIGHT HAND  Final   Special Requests   Final    BOTTLES DRAWN AEROBIC AND ANAEROBIC Blood Culture results may not be optimal due to an inadequate volume of blood received in culture bottles   Culture   Final    NO GROWTH 2 DAYS Performed at [facility], [place] [address] [place], [place] [address]    Report Status PENDING  Incomplete  Culture, blood (routine x 2)     Status: None (Preliminary result)  Collection Time: 11/12/19 11:50 AM   Specimen: BLOOD  Result Value Ref Range Status   Specimen Description BLOOD THUMB  Final   Special Requests   Final    BOTTLES DRAWN AEROBIC ONLY Blood Culture results may not be optimal due to an inadequate volume of blood received in culture bottles   Culture   Final    NO GROWTH 2 DAYS Performed at [facility], [address], [place], [place] [address]    Report Status PENDING  Incomplete  MRSA PCR Screening     Status: None   Collection Time: 11/12/19  2:31 PM   Specimen: Nasopharyngeal  Result Value Ref Range Status   MRSA by PCR NEGATIVE NEGATIVE Final    Comment:        The GeneXpert MRSA Assay (FDA approved for NASAL specimens only), is one component of a comprehensive MRSA colonization surveillance program. It is not intended to diagnose MRSA infection nor to guide or monitor treatment for MRSA infections. Performed at [facility], [place] [address] [place], [place] [address]          Radiology Studies: EEG  Result Date: 11/12/2019 Lora Havens, MD     11/12/2019  2:23 PM Patient Name: Rachel Chaney MRN: [id number] Epilepsy Attending: Lora Havens Referring Physician/Provider: Elease Etienne, PA Date:  11/12/2019 Duration: 27.26 mins Patient history: 59 year old female presented with first-time seizure.  EEG to evaluate for seizure. Level of alertness:  lethargic AEDs during EEG study: None Technical aspects: This EEG study was done with scalp electrodes positioned according to the 10-20 International system of electrode placement. Electrical activity was acquired at a sampling rate of 500Hz  and reviewed with a high frequency filter of 70Hz  and a low frequency filter of 1Hz . EEG data were recorded continuously and digitally stored. Description: EEG showed continuous generalized 3 to 5 Hz theta and delta slowing.  Hyperventilation and photic stimulation were not performed.   ABNORMALITY -Continuous slow, generalized IMPRESSION: This study is suggestive of severe diffuse encephalopathy, nonspecific to etiology.  No seizures or epileptiform discharges were seen throughout the recording. Lora Havens   DG Chest Port 1 View  Result Date: 11/12/2019 CLINICAL DATA:  Respiratory failure. EXAM: PORTABLE CHEST 1 VIEW COMPARISON:  11/11/2019. FINDINGS: Heart size is normal. Aortic atherosclerotic calcifications. Decreased lung volumes with persistent bibasilar atelectasis and airspace opacities. Visualized osseous structures are intact. IMPRESSION: Persistent bibasilar atelectasis and airspace disease. Aortic Atherosclerosis (ICD10-I70.0). Electronically Signed   By: Kerby Moors M.D.   On: 11/12/2019 10:02        Scheduled Meds: . aspirin EC  81 mg Oral Daily  . atorvastatin  80 mg Oral Daily  . Chlorhexidine Gluconate Cloth  6 each Topical Q0600  . cinacalcet  30 mg Oral Q M,W,F-HD  . doxercalciferol  11 mcg Intravenous Q M,W,F-HD  . ferric citrate  420 mg Oral TID WC  . heparin  5,000 Units Subcutaneous Q8H  . insulin aspart  0-15 Units Subcutaneous TID WC  . sucroferric oxyhydroxide  1,000 mg Oral TID WC   Continuous Infusions:   LOS: 3 days    Time spent: 35 mins.More than 50% of that  time was spent in counseling and/or coordination of care.      Shelly Coss, MD [facility] P11/05/2019, 9:01 AM

## 2019-11-14 NOTE — Progress Notes (Signed)
Patient becoming more delirious throughout shift. Patient has not slept all night. Patient continues to take oxygen off and cardiac monitor off. Safety mitts placed on patient. Patient removed safety mitts.  At 0600 vitals, patient not wearing oxygen and Spo2 70% on room air. Patient placed back on 3L nasal canula and Spo2 came up to 97%. Continuous pulse ox placed on patient at this time.

## 2019-11-14 NOTE — Progress Notes (Addendum)
Family Medicine Teaching Service Daily Progress Note Intern Pager: [phone]  Patient name: Rachel Chaney Medical record number: [id number] Date of birth: [birth year] Age: 59 y.o. Gender: female  Primary Care Provider: Benay Pike, MD Consultants: CCM (as/oh), nephrology, neurology (S/O), PT Code Status: Full  Pt Overview and Major Events to Date:  11/11/2019 - admitted for seizure-like activity, altered mentation, hypercarbia, pneumonia 11/12/2019 - Transferred to CCM for hypercarbia, somnolence 11/14/2019 - Family practice resume care  Assessment and Plan: Rachel I Dorsettis a 59 y.o.femalepresenting with seizure-like activity and vomiting. PMH is significant forESRD on HDMWF, T2DM, Macrocytic anemia, HLD, HTN, GERD and H/o Paroxysmal Afib.  Altered mental status, primarily nocturnal Unsure if patient AMS is due to undiagnosed OSA and patient becoming hypercarbic or other form of delirium, such as sundowning.  Patient without history of dementia.  During the daytime and at patient's baseline she is alert and oriented x4, however overnight is sluggish, restless, and confused.  Slow to speak.  MRI brain from 11/5 is negative for acute infarction.  It has been repeatedly suggested as far back as 2017 that patient would benefit from an outpatient sleep study due to suspicion for OSA, however she has never gotten this done. -Continue to monitor for vitals -PT/OT following -recommending home health PT/OT, 24-hour supervision, 3 and 1 bedside commode. -Ordering stat ABG, CBG, and asking RT to place patient on CPAP -Strongly recommend patient have sleep study done outpatient -Depending on patient's AMS may decide to keep her another night.  Acute Hypoxemic andHypercarbic respiratory failure Likely due to fluid overload versus undiagnosed OSA.  Patient afebrile. Patient not on any antibiotics.  She has normal work of breathing during the day and at night, sats well on 3 L nasal cannula,  however her ABG shows CO2 retention and hypoxemia. -Patient currently on BiPAP due to hypercarbia, can transition back to room air with better mentation -Continuous Pulse Ox -Would recommend CPAP at night  ESRD on HD [facility] MWF.  Last session yesterday 11/6, tolerated this well. -Nephrology following, appreciate recs -Next HD Monday 11/8  Type 2 diabetes:  HbA1c on admission 9%.  At home patient prescribed Levemir 50 units in the morning and 40 units at night.  Most recent CBG 101. -Continue CBGs 4 times daily -Moderate sliding scale insulin  Macrocytosis, stable Normal iron, elevated ferritin, normal B12 and folate.   Consider chronic disease as etiology. - AM CBC  HLD:  ASCVD risk 9.1%, patient already on Lipitor 80mg  daily.LDL 35. -Continue home Atorva 80  Essential HTN: Most recent BP 117/56, overnight has been 135/50s.  At home pt takes Carvedilol 25 mg BID and Hydralazine 25 mg BID. -Carvedilol discontinued d/t low diastolic pressures, continue to hold hydralazine.  History of paroxysmal Afib:Pt takes Aspirin EC 81 mg Daily. EKG NSR and without paroxysmal A. fib.  -Consider holding home ASA 81 due to new recommendation to not start as primary prevention in patient's 105+ years older -Continuous cardiac monitoring  FEN/GI: Carb Controlled diet PPx: Heparin  Dispo: Feel patient would benefit from further work-up and evaluation of her altered mentation, however patient would also strongly benefit from sleep study outpatient.  Subjective:  Patient seen laying in bed, nasal cannula in place, patient very restless, although she is alert and oriented x3 she is on her baseline level of function.  Objective: Temp:  [97.5 F (36.4 C)-99.9 F (37.7 C)] 99.9 F (37.7 C) (11/05 2126) Pulse Rate:  [81-94] 94 (11/05 2126) Resp:  [  12-30] 20 (11/05 2126) BP: (54-137)/(28-103) 117/56 (11/05 2126) SpO2:  [90 %-100 %] 90 % (11/05 2126) Weight:  [102 kg-104.1 kg]  102 kg (11/05 1113) Physical Exam: General: Pleasant patient, sundowning Cardiovascular: Grade 2 systolic murmur appreciated, regular rhythm Respiratory: Moving air well although distant breath sounds,  Abdomen: Normal bowel sounds, no masses appreciated Extremities: Right lower extremity s/p transmetatarsal amputation, patient moving all extremities spontaneously Neuro: Cranial nerves II-XII intact, patient following commands although slowly and with some difficulty (for example patient asked to clap her hands, she instead waves her hands), finger-to-nose test normal  Laboratory: Recent Labs  Lab 11/13/19 0042 11/13/19 2249 11/14/19 0327  WBC 24.5* 17.8* 17.4*  HGB 10.6* 11.2* 11.4*  HCT 34.4* 36.6 36.9  PLT 175 187 183   Recent Labs  Lab 11/12/19 0511 11/12/19 0825 11/12/19 1800 11/13/19 0042 11/14/19 0327  NA 135   < > 138 139 133*  K 4.8   < > 4.2 3.6 4.1  CL 92*   < > 93* 97* 94*  CO2 22   < > 23 27 26   BUN 46*   < > 48* 18 29*  CREATININE 9.79*   < > 10.40* 6.15* 7.84*  CALCIUM 8.7*   < > 8.5* 8.9 8.6*  PROT 7.1  --   --  6.0* 6.0*  BILITOT 1.2  --   --  1.0 0.6  ALKPHOS 89  --   --  57 64  ALT 24  --   --  20 19  AST 23  --   --  18 22  GLUCOSE 271*   < > 75 84 284*   < > = values in this interval not displayed.    Imaging/Diagnostic Tests: MRI Brain 11/5: Negative for infarction  Daisy Floro, DO 11/14/2019, 11:59 PM PGY-3, [place] Intern pager: [phone], text pages welcome

## 2019-11-14 NOTE — Progress Notes (Signed)
PT Cancellation Note  Patient Details Name: Rachel Chaney MRN: [id number] DOB: 12-Feb-1960   Cancelled Treatment:    Reason Eval/Treat Not Completed: Other (comment).  Pt was in HD, had imaging and then declined PT over fatigue.     Ramond Dial 11/14/2019, 5:03 PM   Mee Hives, PT MS Acute Rehab Dept. Number: [place]

## 2019-11-15 DIAGNOSIS — J9602 Acute respiratory failure with hypercapnia: Secondary | ICD-10-CM | POA: Diagnosis not present

## 2019-11-15 LAB — COMPREHENSIVE METABOLIC PANEL
ALT: 19 U/L (ref 0–44)
AST: 19 U/L (ref 15–41)
Albumin: 2.8 g/dL — ABNORMAL LOW (ref 3.5–5.0)
Alkaline Phosphatase: 61 U/L (ref 38–126)
Anion gap: 15 (ref 5–15)
BUN: 14 mg/dL (ref 6–20)
CO2: 24 mmol/L (ref 22–32)
Calcium: 9.2 mg/dL (ref 8.9–10.3)
Chloride: 98 mmol/L (ref 98–111)
Creatinine, Ser: 5.63 mg/dL — ABNORMAL HIGH (ref 0.44–1.00)
GFR, Estimated: 8 mL/min — ABNORMAL LOW (ref >60)
Glucose, Bld: 93 mg/dL (ref 70–99)
Potassium: 3.8 mmol/L (ref 3.5–5.1)
Sodium: 137 mmol/L (ref 135–145)
Total Bilirubin: 1 mg/dL (ref 0.3–1.2)
Total Protein: 6.2 g/dL — ABNORMAL LOW (ref 6.5–8.1)

## 2019-11-15 LAB — BLOOD GAS, ARTERIAL
Acid-Base Excess: 3.4 mmol/L — ABNORMAL HIGH (ref 0.0–2.0)
Bicarbonate: 29.1 mmol/L — ABNORMAL HIGH (ref 20.0–28.0)
Drawn by: 55062
FIO2: 40
O2 Saturation: 94.6 %
Patient temperature: 37.4
pCO2 arterial: 60.1 mmHg — ABNORMAL HIGH (ref 32.0–48.0)
pH, Arterial: 7.309 — ABNORMAL LOW (ref 7.350–7.450)
pO2, Arterial: 77.6 mmHg — ABNORMAL LOW (ref 83.0–108.0)

## 2019-11-15 LAB — CBC
HCT: 38.5 % (ref 36.0–46.0)
Hemoglobin: 11.4 g/dL — ABNORMAL LOW (ref 12.0–15.0)
MCH: 32.8 pg (ref 26.0–34.0)
MCHC: 29.6 g/dL — ABNORMAL LOW (ref 30.0–36.0)
MCV: 110.6 fL — ABNORMAL HIGH (ref 80.0–100.0)
Platelets: 177 10*3/uL (ref 150–400)
RBC: 3.48 MIL/uL — ABNORMAL LOW (ref 3.87–5.11)
RDW: 16.7 % — ABNORMAL HIGH (ref 11.5–15.5)
WBC: 16.2 10*3/uL — ABNORMAL HIGH (ref 4.0–10.5)
nRBC: 0.1 % (ref 0.0–0.2)

## 2019-11-15 LAB — DRUG SCREEN 10 W/CONF, SERUM
Amphetamines, IA: NEGATIVE ng/mL
Barbiturates, IA: NEGATIVE ug/mL
Benzodiazepines, IA: NEGATIVE ng/mL
Cocaine & Metabolite, IA: NEGATIVE ng/mL
Methadone, IA: NEGATIVE ng/mL
Opiates, IA: NEGATIVE ng/mL
Oxycodones, IA: NEGATIVE ng/mL
Phencyclidine, IA: NEGATIVE ng/mL
Propoxyphene, IA: NEGATIVE ng/mL
THC(Marijuana) Metabolite, IA: NEGATIVE ng/mL

## 2019-11-15 LAB — GLUCOSE, CAPILLARY
Glucose-Capillary: 101 mg/dL — ABNORMAL HIGH (ref 70–99)
Glucose-Capillary: 87 mg/dL (ref 70–99)
Glucose-Capillary: 106 mg/dL — ABNORMAL HIGH (ref 70–99)
Glucose-Capillary: 150 mg/dL — ABNORMAL HIGH (ref 70–99)

## 2019-11-15 NOTE — Plan of Care (Signed)

## 2019-11-15 NOTE — Progress Notes (Signed)
[facility] Progress Note  Subjective: seen in room, not sure why she is here  Vitals:   11/14/19 1128 11/14/19 2126 11/15/19 0427 11/15/19 0501  BP:  (!) 117/56 113/71   Pulse:  94 89 85  Resp: 14 20 18 20   Temp:  99.9 F (37.7 C) 99 F (37.2 C)   TempSrc:  Oral Oral   SpO2:  90% 97% 94%  Weight:      Height:        Exam:  seen in HD, nasal O2, calm , no distress  no jvd  Chest fine rales L base, occ rales R base  Cor reg no RG  Abd soft ntnd no ascites   Ext 1+ pretib edema   Neuro nonfocal , Ox 3    Home meds:   - asa / lipitor/ hydralazine 25 bid/ ntg patch qd/ trental 400 tid  - velphoro 1 gm tid ac/ auryxia 420mg  tid ac/  sensipar 30 tiw  - insulin levemir bid    [facility]   3.5h  450/800  100kg (just lowered) P4  LUE AVF 15g  Hep 8000 hectorol 25mcg IVP q HD sensipar 30mg  PO q HD   Assessment/Plan: 1.  AMS - hypercarbic resp failure on admission, appears improved. Per primary team needs sleep study for OSA.   2.  Acute resp failure - per primary team. SP IV abx x 2 days then dc'd. On nasal O2 now. Concerned about OSA.  3.  ESRD:  HD MWF. HD yest. Next HD Monday.  4.  Hypertension/volume: no vol ^ on exam. L> R rales persist on exam today. Down 105.5 > 102kg here. Low BP's on HD limiting UF.  5.  Anemia ckd - Hb 12, not on esa as OP 6.   PAF - on asa 7.  Metabolic bone disease/ ckd - cont hect, sensipar, velphoro/ Verlee Rossetti 11/15/2019, 1:08 PM   Recent Labs  Lab 11/12/19 1800 11/13/19 0042 11/14/19 0327 11/15/19 0505  K 4.2   < > 4.1 3.8  BUN 48*   < > 29* 14  CREATININE 10.40*   < > 7.84* 5.63*  CALCIUM 8.5*   < > 8.6* 9.2  PHOS 11.5*  --  7.2*  --   HGB 11.1*   < > 11.4* 11.4*   < > = values in this interval not displayed.   Inpatient medications: . aspirin EC  81 mg Oral Daily  . atorvastatin  80 mg Oral Daily  . Chlorhexidine Gluconate Cloth  6 each Topical Q0600  . cinacalcet  30 mg Oral  Q M,W,F-HD  . doxercalciferol  11 mcg Intravenous Q M,W,F-HD  . ferric citrate  420 mg Oral TID WC  . heparin  5,000 Units Subcutaneous Q8H  . insulin aspart  0-15 Units Subcutaneous TID WC  . sucroferric oxyhydroxide  1,000 mg Oral TID WC    acetaminophen, docusate sodium, guaiFENesin, heparin, polyethylene glycol, sodium chloride

## 2019-11-16 DIAGNOSIS — J9602 Acute respiratory failure with hypercapnia: Secondary | ICD-10-CM | POA: Diagnosis not present

## 2019-11-16 LAB — GLUCOSE, CAPILLARY
Glucose-Capillary: 123 mg/dL — ABNORMAL HIGH (ref 70–99)
Glucose-Capillary: 145 mg/dL — ABNORMAL HIGH (ref 70–99)
Glucose-Capillary: 133 mg/dL — ABNORMAL HIGH (ref 70–99)
Glucose-Capillary: 134 mg/dL — ABNORMAL HIGH (ref 70–99)

## 2019-11-16 LAB — COMPREHENSIVE METABOLIC PANEL
ALT: 18 U/L (ref 0–44)
AST: 17 U/L (ref 15–41)
Albumin: 2.9 g/dL — ABNORMAL LOW (ref 3.5–5.0)
Alkaline Phosphatase: 95 U/L (ref 38–126)
Anion gap: 14 (ref 5–15)
BUN: 25 mg/dL — ABNORMAL HIGH (ref 6–20)
CO2: 29 mmol/L (ref 22–32)
Calcium: 9.2 mg/dL (ref 8.9–10.3)
Chloride: 95 mmol/L — ABNORMAL LOW (ref 98–111)
Creatinine, Ser: 7.93 mg/dL — ABNORMAL HIGH (ref 0.44–1.00)
GFR, Estimated: 5 mL/min — ABNORMAL LOW (ref >60)
Glucose, Bld: 130 mg/dL — ABNORMAL HIGH (ref 70–99)
Potassium: 3.9 mmol/L (ref 3.5–5.1)
Sodium: 138 mmol/L (ref 135–145)
Total Bilirubin: 1.1 mg/dL (ref 0.3–1.2)
Total Protein: 6.2 g/dL — ABNORMAL LOW (ref 6.5–8.1)

## 2019-11-16 LAB — CBC
HCT: 36.5 % (ref 36.0–46.0)
Hemoglobin: 11.2 g/dL — ABNORMAL LOW (ref 12.0–15.0)
MCH: 33.2 pg (ref 26.0–34.0)
MCHC: 30.7 g/dL (ref 30.0–36.0)
MCV: 108.3 fL — ABNORMAL HIGH (ref 80.0–100.0)
Platelets: 194 10*3/uL (ref 150–400)
RBC: 3.37 MIL/uL — ABNORMAL LOW (ref 3.87–5.11)
RDW: 16.2 % — ABNORMAL HIGH (ref 11.5–15.5)
WBC: 15.8 10*3/uL — ABNORMAL HIGH (ref 4.0–10.5)
nRBC: 0 % (ref 0.0–0.2)

## 2019-11-16 NOTE — Progress Notes (Signed)
Pt placed on BIPAP and she is tolerating it well at this time RN aware

## 2019-11-16 NOTE — Care Management (Addendum)
Received call from resident inquiring about CPAP for discharge.  Explained that patient would need sleep study prior to getting CPAP approved, sleep study isn't done inpatient. Discussed qualifications for NIV.  Referral made to [facility] with [facility] to review chart for NIV criteria. Will update team with more info as it's available.   Per Rachel Chaney qualifies for NIV, he will start process. Anticipate it being available no sooner than tomorrow.

## 2019-11-16 NOTE — Progress Notes (Signed)
Family Medicine Teaching Service Daily Progress Note Intern Pager: [phone]  Patient name: Rachel Chaney Medical record number: [id number] Date of birth: [birth year] Age: 59 y.o. Gender: female  Primary Care Provider: Benay Pike, MD Consultants: Nephrology, Neurology Code Status: Full  Pt Overview and Major Events to Date:  11/11/2019 - admitted for seizure-like activity, altered mentation, hypercarbia, pneumonia 11/12/2019 - Transferred to CCM for hypercarbia, somnolence 11/14/2019 - Family practice resume care  Assessment and Plan: Malayzia I Dorsettis a 59 y.o.femalepresenting with seizure-like activity and vomiting. PMH is significant forESRD on HDMWF, T2DM, Macrocytic anemia, HLD, HTN, GERD and H/o Paroxysmal Afib.   Altered mental status, primarily nocturnal Patient received either CPAP or BiPAP last night and did well with.  Had no repeat episodes of delerium like on previous nights.   Currently mentatinmg appropriately this morning.  MRI brain from 11/5 is negative for acute infarction. -Continue to monitor for vitals -Speak with nurse to clarify what patient received overnight -PT/OT following -recommending home health PT/OT, 24-hour supervision, 3 and 1 bedside commode. - Placed order for Outpatient Sleep Study and CPAP machine at home - Will contact SW about setting up sleep study/who to contact - D/C pending Sleep study set-up  Acute Hypoxemic andHypercarbic respiratory failure Likely due to fluid overload versus undiagnosed OSA.  Patient afebrile.Patient not on any antibiotics.  She has normal work of breathing during the day and at night.   -Patient currently on BiPAP due to hypercarbia, can transition back to room air with better mentation -Continuous Pulse Ox -Would recommend CPAP at night  ESRD on HD [facility] MWF.  Last session yesterday 11/6, tolerated this well. -Nephrologyfollowing, appreciate recs -Next HD Monday 11/8  Type 2 diabetes:   HbA1c on admission 9%.  At home patient prescribed Levemir 50 units in the morning and 40 units at night.  Most recent CBG 101. -Continue CBGs 4 times daily -Moderate sliding scale insulin  Macrocytosis, stable Normal iron, elevated ferritin, normal B12 and folate.  Consider chronic disease as etiology. - AM CBC  HLD:  ASCVD risk 9.1%, patient already on Lipitor 80mg  daily.LDL 35. -Continue home Atorva 80  Essential HTN: Most recent BP 117/56, overnight has been 135/50s.  At home pt takes Carvedilol 25 mg BID and Hydralazine 25 mg BID. -Carvedilol discontinued d/t low diastolic pressures, continue to hold hydralazine.  History of paroxysmal Afib:Pt takes Aspirin EC 81 mg Daily. EKG NSR and without paroxysmal A. fib.  -Consider holding home ASA 81 due to new recommendation to not start as primary prevention in patient's 32+ years older -Continuous cardiac monitoring  FEN/GI:Carb Controlled diet JKD:TOIZTIW  Status is: Inpatient  Remains inpatient appropriate because:Persistent severe electrolyte disturbances   Dispo: The patient is from: Home              Anticipated d/c is to: Home              Anticipated d/c date is: 1 day              Patient currently is not medically stable to d/c.     Subjective:  Patient feeling well this morning.  Feels like she is breathing well at this time.  Indicates she does not have CPAP mask at home.  Objective: Temp:  [98.1 F (36.7 C)-99.2 F (37.3 C)] 98.1 F (36.7 C) (11/07 0604) Pulse Rate:  [84-96] 84 (11/07 0604) Resp:  [18-26] 20 (11/07 0604) BP: (111-136)/(48-59) 111/48 (11/07 0604) SpO2:  [95 %-97 %]  97 % (11/07 0604) FiO2 (%):  [40 %] 40 % (11/06 2348) Physical Exam:  Physical Exam Constitutional:      General: She is not in acute distress.    Appearance: Normal appearance. She is not ill-appearing.  HENT:     Head: Normocephalic and atraumatic.     Mouth/Throat:     Mouth: Mucous membranes are moist.   Cardiovascular:     Rate and Rhythm: Normal rate and regular rhythm.  Pulmonary:     Effort: Pulmonary effort is normal.     Breath sounds: Normal breath sounds.  Skin:    General: Skin is warm.     Capillary Refill: Capillary refill takes less than 2 seconds.  Neurological:     Mental Status: She is alert.     Laboratory: Recent Labs  Lab 11/13/19 2249 11/14/19 0327 11/15/19 0505  WBC 17.8* 17.4* 16.2*  HGB 11.2* 11.4* 11.4*  HCT 36.6 36.9 38.5  PLT 187 183 177   Recent Labs  Lab 11/13/19 0042 11/14/19 0327 11/15/19 0505  NA 139 133* 137  K 3.6 4.1 3.8  CL 97* 94* 98  CO2 27 26 24   BUN 18 29* 14  CREATININE 6.15* 7.84* 5.63*  CALCIUM 8.9 8.6* 9.2  PROT 6.0* 6.0* 6.2*  BILITOT 1.0 0.6 1.0  ALKPHOS 57 64 61  ALT 20 19 19   AST 18 22 19   GLUCOSE 84 284* 93    ABG    Component Value Date/Time   PHART 7.309 (L) 11/15/2019 0503   PCO2ART 60.1 (H) 11/15/2019 0503   PO2ART 77.6 (L) 11/15/2019 0503   HCO3 29.1 (H) 11/15/2019 0503   TCO2 28 11/12/2019 0948   ACIDBASEDEF 2.1 (H) 11/12/2019 0125   O2SAT 94.6 11/15/2019 0503    Imaging/Diagnostic Tests:  MRI Brain 11/5: Negative for infarction  Delora Fuel, MD 11/16/2019, 8:00 AM PGY-1, [place] Intern pager: [phone], text pages welcome

## 2019-11-16 NOTE — Progress Notes (Signed)
[facility] Progress Note  Subjective: seen in room, no c/o's, nasal O2  Vitals:   11/15/19 1408 11/15/19 2138 11/15/19 2348 11/16/19 0604  BP: (!) 126/59 (!) 136/50  (!) 111/48  Pulse: 88 86 96 84  Resp: 19 18 (!) 26 20  Temp: 99.2 F (37.3 C) 98.3 F (36.8 C)  98.1 F (36.7 C)  TempSrc:  Oral  Oral  SpO2: 97% 95% 96% 97%  Weight:      Height:        Exam:  seen in HD, nasal O2, calm , no distress  no jvd  Chest mostly clear bilat  Cor reg no RG  Abd soft ntnd no ascites   Ext trace  pretib edema   Neuro nonfocal , Ox 3    Home meds:   - asa / lipitor/ hydralazine 25 bid/ ntg patch qd/ trental 400 tid  - velphoro 1 gm tid ac/ auryxia 420mg  tid ac/  sensipar 30 tiw  - insulin levemir bid    [facility] M[facility]   3.5h  450/800  100kg (just lowered) P4  LUE AVF 15g  Hep 8000 hectorol 84mcg IVP q HD sensipar 30mg  PO q HD   Assessment/Plan: 1.  AMS - hypercarbic resp failure on admission, appears improved. Per primary team needs sleep study to be scheduled in order to get home CPAP  2.  Acute/ chronic hypercarbic resp failure - prob OHS/ OSA, having nocturnal confusion related to this, per primary team. SP IV abx 1- 2 days then dc'd. On nasal O2 now.   3.  ESRD:  HD MWF. Next HD Monday.  4.  Hypertension/volume: no vol ^ on exam. 1-2kg over dry, UF same w/ next hd  5.  Anemia ckd - Hb 12, not on esa as OP 6.   PAF - on asa 7.  Metabolic bone disease/ ckd - cont hect, sensipar, velphoro/ Verlee Rossetti 11/16/2019, 10:11 AM   Recent Labs  Lab 11/12/19 1800 11/13/19 0042 11/14/19 0327 11/14/19 0327 11/15/19 0505 11/16/19 0823  K 4.2   < > 4.1   < > 3.8 3.9  BUN 48*   < > 29*   < > 14 25*  CREATININE 10.40*   < > 7.84*   < > 5.63* 7.93*  CALCIUM 8.5*   < > 8.6*   < > 9.2 9.2  PHOS 11.5*  --  7.2*  --   --   --   HGB 11.1*   < > 11.4*   < > 11.4* 11.2*   < > = values in this interval not displayed.   Inpatient  medications: . aspirin EC  81 mg Oral Daily  . atorvastatin  80 mg Oral Daily  . Chlorhexidine Gluconate Cloth  6 each Topical Q0600  . cinacalcet  30 mg Oral Q M,W,F-HD  . doxercalciferol  11 mcg Intravenous Q M,W,F-HD  . ferric citrate  420 mg Oral TID WC  . heparin  5,000 Units Subcutaneous Q8H  . insulin aspart  0-15 Units Subcutaneous TID WC  . sucroferric oxyhydroxide  1,000 mg Oral TID WC    acetaminophen, docusate sodium, guaiFENesin, heparin, polyethylene glycol, sodium chloride

## 2019-11-16 NOTE — Plan of Care (Signed)

## 2019-11-16 NOTE — Care Management (Cosign Needed)
Patient continues to exhibit signs of hypercapnia associated with chronic respiratory failure secondary to OSA. Patient requires the use of NIV both at sleep and in the daytime to help with exacerbation periods. The use of the NIV will treat both the patients high PCO2 levels and can reduce the risk of exacerbations and future hospitalizations when used at night and during the day. The patient will need these advanced settings in conjunction with their current medication regimen; BIPAP is not an option due to its functional limitations and the severity of the patient's condition. Failure to have NIV available for use over a 24 hour period could lead to death.  Patient is able to clear airway and manage secretions.

## 2019-11-16 NOTE — Progress Notes (Signed)
Pt is not ready to go on the BIPAP at this time She is eating, will check again

## 2019-11-17 LAB — CULTURE, BLOOD (ROUTINE X 2)
Culture: NO GROWTH
Culture: NO GROWTH

## 2019-11-17 LAB — CBC
HCT: 35.3 % — ABNORMAL LOW (ref 36.0–46.0)
Hemoglobin: 10.9 g/dL — ABNORMAL LOW (ref 12.0–15.0)
MCH: 33.7 pg (ref 26.0–34.0)
MCHC: 30.9 g/dL (ref 30.0–36.0)
MCV: 109.3 fL — ABNORMAL HIGH (ref 80.0–100.0)
Platelets: 187 10*3/uL (ref 150–400)
RBC: 3.23 MIL/uL — ABNORMAL LOW (ref 3.87–5.11)
RDW: 15.9 % — ABNORMAL HIGH (ref 11.5–15.5)
WBC: 16.1 10*3/uL — ABNORMAL HIGH (ref 4.0–10.5)
nRBC: 0.1 % (ref 0.0–0.2)

## 2019-11-17 LAB — COMPREHENSIVE METABOLIC PANEL
ALT: 17 U/L (ref 0–44)
AST: 15 U/L (ref 15–41)
Albumin: 2.7 g/dL — ABNORMAL LOW (ref 3.5–5.0)
Alkaline Phosphatase: 87 U/L (ref 38–126)
Anion gap: 13 (ref 5–15)
BUN: 35 mg/dL — ABNORMAL HIGH (ref 8–23)
CO2: 26 mmol/L (ref 22–32)
Calcium: 8.7 mg/dL — ABNORMAL LOW (ref 8.9–10.3)
Chloride: 96 mmol/L — ABNORMAL LOW (ref 98–111)
Creatinine, Ser: 9.32 mg/dL — ABNORMAL HIGH (ref 0.44–1.00)
GFR, Estimated: 4 mL/min — ABNORMAL LOW (ref >60)
Glucose, Bld: 103 mg/dL — ABNORMAL HIGH (ref 70–99)
Potassium: 3.9 mmol/L (ref 3.5–5.1)
Sodium: 135 mmol/L (ref 135–145)
Total Bilirubin: 0.8 mg/dL (ref 0.3–1.2)
Total Protein: 6 g/dL — ABNORMAL LOW (ref 6.5–8.1)

## 2019-11-17 LAB — GLUCOSE, CAPILLARY
Glucose-Capillary: 143 mg/dL — ABNORMAL HIGH (ref 70–99)
Glucose-Capillary: 92 mg/dL (ref 70–99)
Glucose-Capillary: 84 mg/dL (ref 70–99)

## 2019-11-17 MED ORDER — HEPARIN SODIUM (PORCINE) 1000 UNIT/ML DIALYSIS: 8000.0000 [IU] | Freq: Once | INTRAMUSCULAR | Status: DC

## 2019-11-17 MED ORDER — DOXERCALCIFEROL 4 MCG/2ML IV SOLN
INTRAVENOUS | Status: AC
  Administered 2019-11-17: 11 ug via INTRAVENOUS
  Filled 2019-11-17: qty 6

## 2019-11-17 MED ORDER — HEPARIN SODIUM (PORCINE) 1000 UNIT/ML IJ SOLN
INTRAMUSCULAR | Status: AC
  Filled 2019-11-17: qty 1

## 2019-11-17 NOTE — TOC Transition Note (Addendum)
Transition of Care [facility]) - CM/SW Discharge Note   Patient Details  Name: Rachel Chaney MRN: [id number] Date of Birth: 02/07/1960  [facility] CM/SW Contact:  Angelita Ingles, RN Phone Number: [phone]  11/17/2019, 3:54 PM   Clinical Narrative:    CM at bedside to offer patient choice for DME and Home Health. Home health has been set up through [facility] and DME  (NIV) has been set up through [facility]. NIV to be delivered to room. [employer] has been made aware of patients choice for DME.     Final next level of care: [place] Barriers to Discharge: No Barriers Identified   Patient Goals and CMS Choice Patient states their goals for this hospitalization and ongoing recovery are:: Wants to go home CMS Medicare.gov Compare Post Acute Care list provided to:: Patient Choice offered to / list presented to : Patient  Discharge Placement                       Discharge Plan and Services                DME Arranged: NIV DME Agency: Other - [place] ([place]) Date DME Agency Contacted: 11/17/19 Time DME Agency Contacted: [phone] Representative spoke with at DME Agency: [place] HH Arranged: PT, OT [place] Agency: [place] ([place]) Date [place]: 11/17/19 Time [place]: 6553 Representative spoke with at [place]: [place] ([place]) Interventions     Readmission Risk Interventions No flowsheet data found.

## 2019-11-17 NOTE — Progress Notes (Signed)
Pt only wore BIPAP for only an 1. She wore O2 90-94% while resting

## 2019-11-17 NOTE — Progress Notes (Signed)
OT Cancellation Note  Patient Details Name: AUBRIELLE STROUD MRN: [id number] DOB: [birth year]   Cancelled Treatment:    Reason Eval/Treat Not Completed: Patient declined, no reason specified.  Declined engagement in OT at this time, reports she is waiting to go home and she is good "I walked up and down the hallway today" (RN/NT report this did not happen).  Will follow and see as able.   Jolaine Artist, OT Acute Rehabilitation Services Pager [phone] Office [phone]   Delight Stare 11/17/2019, 2:50 PM

## 2019-11-17 NOTE — Plan of Care (Signed)

## 2019-11-17 NOTE — Progress Notes (Signed)
Family Medicine Teaching Service Attending Brief Progress Note  S: Patient seen and examined. Patient reports she is doing well, has no concerns today. Estimates she wore bipap for about 2hrs last night.  O: BP (!) 97/54 (BP Location: Left Wrist)   Pulse 93   Temp 98.4 F (36.9 C) (Oral)   Resp 18   Ht 5\' 4"  (1.626 m)   Wt 98.5 kg Comment: stood to scale   LMP 09/18/2011   SpO2 98%   BMI 37.27 kg/m   Gen: no acute distress, pleasant cooperative Resp: normal work of breathing, speaks in full sentences without distress  A/P:  59 yo F here with AMS and acute hypercarbic respiratory failure, now improved. Tolerating bipap somewhat overnight.  Await home bipap to be set up. Once this is set up, she can be discharged home with outpatient follow up.  Will cosign resident note when it is available.  Chrisandra Netters, MD [place]

## 2019-11-17 NOTE — Procedures (Signed)
I was present at this dialysis session, have reviewed the session itself and made  appropriate changes  Qb 400 BP 130/82 on 2L [place] No edema, lungs clear Per RN no issues Post wt today - no wts have been collected since 11/14/19.   Jannifer Hick MD [facility] pager [phone]   11/17/2019, 7:31 AM

## 2019-11-17 NOTE — Progress Notes (Signed)
[place] from HD called and said that HD lasted 3.5 hours, 2.5 liters were removed, last vitals were 146/41, stated she will recheck before leaving HD, said HR stable, weight 98.5 kg.

## 2019-11-17 NOTE — Progress Notes (Signed)
SATURATION QUALIFICATIONS: (This note is used to comply with regulatory documentation for home oxygen)  Patient Saturations on Room Air at Rest = 80%  Patient Saturations on Room Air while Ambulating = unable to assess   Patient Saturations on 3 Liters of oxygen while Ambulating = 90%  Please briefly explain why patient needs home oxygen: PT unable to maintain O2 saturations above 90% while at rest or walking

## 2019-11-17 NOTE — Progress Notes (Signed)
RT went to check on pt and the RN said that pt wore BIPAP only for 1 hour and took it off. She is resting comfortably at this moment.

## 2019-11-17 NOTE — Progress Notes (Signed)
PT Cancellation Note  Patient Details Name: Rachel Chaney MRN: [id number] DOB: 1960-12-07   Cancelled Treatment:    Reason Eval/Treat Not Completed: Patient at procedure or test/unavailable (HD)   [name] B [name] 11/17/2019, 7:41 AM  [name], PT Acute Rehabilitation Services Pager: [phone] Office: [phone]

## 2019-11-17 NOTE — Progress Notes (Signed)
Family Medicine Teaching Service Daily Progress Note Intern Pager: [phone]  Patient name: TYESE FINKEN Medical record number: [id number] Date of birth: 1960-02-19 Age: 59 y.o. Gender: female  Primary Care Provider: Benay Pike, MD Consultants: Nephrology Code Status: Full  Pt Overview and Major Events to Date:  Admitted 11/2 Transfer over from CCM to [place] on 11/5  Assessment and Plan: Kalea I Dorsettis a 59 y.o.femalepresenting with seizure-like activity and vomiting. PMH is significant forESRD on HDMWF, T2DM, Macrocytic anemia, HLD, HTN, GERD and H/o Paroxysmal Afib.   Altered mental status, primarily nocturnal Patient received BiPAP last night but only kept for an hour.  Had no repeat episodes of delerium.   Currently mentating appropriately this morning.  MRI brain from 11/5 is negative for acute infarction.  Cannot get CPAP machine per CM due to not having sleep study yet but cannot get inpatient.  Filled Out form for BiPap yesterday. -Continue to monitor for vitals -Speak with nurse to clarify what patient received overnight -PT/OT following-recommending home health PT/OT, 24-hour supervision, 3 and 1 bedside commode. - Placed order for Outpatient Sleep Study  - Will contact SW about setting up sleep study/who to contact - D/C pending BiPap - Ambulatory referral to Pulm for sleep study on D/C  Acute Hypoxemic andHypercarbic respiratory failure Likely due to fluid overloadversus undiagnosed OSA. Patient afebrile.Patient not on any antibiotics.She has normal work of breathing during the day and at night.   - Patient currently on BiPAP at night due to hypercarbia - Continuous Pulse Ox - D/C Pending BiPAP for home  ESRD on HD [facility] MWF.  Had session today, tolerated this well. -Nephrologyfollowing, appreciate recs  Type 2 diabetes:  HbA1c on admission 9%. At home patient prescribed Levemir 50 units in the morning and 40 units at night.  Most recent CBG 101. -Continue CBGs 4 times daily -Moderate sliding scale insulin  Macrocytosis, stable Normal iron, elevated ferritin, normal B12 and folate.Consider chronic disease as etiology. - AM CBC  HLD:  ASCVD risk 9.1%, patient already on Lipitor 80mg  daily.LDL 35. -Continue home Atorva 80  Essential JYN:WGNF recent BP 117/56, overnight has been 135/50s. At home pt takes Carvedilol 25 mg BID and Hydralazine 25 mg BID. -Carvedilol discontinued d/t low diastolic pressures, continue to hold hydralazine.  History of paroxysmal Afib:Pt takes Aspirin EC 81 mg Daily.EKG NSR and without paroxysmal A. fib. -Consider holdinghome ASA 81 due to new recommendation to not start as primary prevention in patient's 53+ years older -Continuous cardiac monitoring   FEN/GI: Carb Controlled diet PPx: Heparin   Status is: Inpatient  Remains inpatient appropriate because:  Waiting on BiPAP to D/C   Dispo: The patient is from: Home              Anticipated d/c is to: Home              Anticipated d/c date is: 1 day              Patient currently is medically stable to d/c.     Subjective:  Patient indicates she is currently doing well.  Receiving dialysis today.  No issues overnight but only wore BiPAP for 1 hour while sleeping per nurse.  Objective: Temp:  [97.7 F (36.5 C)-99.7 F (37.6 C)] 98.4 F (36.9 C) (11/08 1232) Pulse Rate:  [85-94] 93 (11/08 1232) Resp:  [16-20] 18 (11/08 1232) BP: (97-156)/(33-70) 97/54 (11/08 1232) SpO2:  [93 %-98 %] 98 % (11/08 1232) Weight:  [98.5  kg-101.3 kg] 98.5 kg (11/08 1117) Physical Exam:  Physical Exam Constitutional:      Appearance: Normal appearance.  HENT:     Head: Normocephalic and atraumatic.     Mouth/Throat:     Mouth: Mucous membranes are moist.  Cardiovascular:     Rate and Rhythm: Normal rate and regular rhythm.  Pulmonary:     Effort: Pulmonary effort is normal.     Breath sounds: Normal breath  sounds.  Abdominal:     General: Abdomen is flat.     Palpations: Abdomen is soft.     Tenderness: There is no abdominal tenderness.  Skin:    General: Skin is warm.  Neurological:     Mental Status: She is alert.     Laboratory: Recent Labs  Lab 11/15/19 0505 11/16/19 0823 11/17/19 0223  WBC 16.2* 15.8* 16.1*  HGB 11.4* 11.2* 10.9*  HCT 38.5 36.5 35.3*  PLT 177 194 187   Recent Labs  Lab 11/15/19 0505 11/16/19 0823 11/17/19 0223  NA 137 138 135  K 3.8 3.9 3.9  CL 98 95* 96*  CO2 24 29 26   BUN 14 25* 35*  CREATININE 5.63* 7.93* 9.32*  CALCIUM 9.2 9.2 8.7*  PROT 6.2* 6.2* 6.0*  BILITOT 1.0 1.1 0.8  ALKPHOS 61 95 87  ALT 19 18 17   AST 19 17 15   GLUCOSE 93 130* 103*    Imaging/Diagnostic Tests: No new imaging  Delora Fuel, MD 11/17/2019, 5:48 PM PGY-1, [place] Intern pager: [phone], text pages welcome

## 2019-11-17 NOTE — Discharge Instructions (Signed)
° °  Dear Early Chars,   Thank you for letting us participate in your care! In this section, you will find a brief hospital admission summary of why you were admitted to the hospital, what happened during your admission, your diagnosis/diagnoses, and recommended follow up.   You were admitted because you were experiencing Seizures and difficulty breathing.   You were diagnosed with Obstructive Sleep Apnea.  You were treated with BiPAP machine to help you sleep.   You were also seen by Nephrology. They continued to manage your dialysis.  Your  improved and you were discharged from the hospital for meeting this goal.    POST-HOSPITAL & CARE INSTRUCTIONS 1. We are sending you home with the BiPAP.  It is very important that you use this at home every night that you sleep.  It is also important that you go to your appointment that Pulmonology calls to schedule for you. 2. Please let PCP/Specialists know of any changes that were made.  3. Please see medications section of this packet for any medication changes.   DOCTOR'S APPOINTMENT & FOLLOW UP CARE INSTRUCTIONS  Future Appointments  Date Time Provider [place]  11/21/2019  2:10 PM Benay Pike, MD [facility]  11/25/2019  8:30 AM Benay Pike, MD [facility] [place]     Thank you for choosing [facility]! Take care and be well!  [facility]  [place], [place] [address] [phone]

## 2019-11-17 NOTE — Progress Notes (Signed)
[facility] Progress Note  Subjective: seen on HD, no c/o's, nasal O2  Vitals:   11/16/19 2031 11/16/19 2124 11/16/19 2338 11/17/19 0534  BP:  137/70  (!) 108/58  Pulse: 85 88 88 90  Resp: 19 16 18 20   Temp:  99.7 F (37.6 C)  99.1 F (37.3 C)  TempSrc:  Oral  Oral  SpO2: 95% 96% 96% 93%  Weight:      Height:        Exam:  seen in HD, nasal O2, calm , no distress  no jvd  Chest mostly clear bilat  Cor reg no RG  Abd soft ntnd no ascites   Ext trace  pretib edema   Neuro nonfocal , Ox 3    Home meds:   - asa / lipitor/ hydralazine 25 bid/ ntg patch qd/ trental 400 tid  - velphoro 1 gm tid ac/ auryxia 420mg  tid ac/  sensipar 30 tiw  - insulin levemir bid    [facility]:  [facility] MWF   3.5h  450/800  100kg (just lowered) P4  LUE AVF 15g  Hep 8000 hectorol 31mcg IVP q HD sensipar 30mg  PO q HD   Assessment/Plan: 1.  AMS - hypercarbic resp failure on admission, improved. Per primary team needs sleep study to be scheduled in order to get home CPAP - appears she took Bipap off after 1hr last PM. 2.  Acute/ chronic hypercarbic resp failure - prob OHS/ OSA, having nocturnal confusion related to this, per primary team. SP IV abx 1- 2 days then dc'd. On nasal O2 now.   3.  ESRD:  HD MWF. Tolerating currently.  4.  Hypertension/volume: no vol ^ on exam. Needs weights - last was 11/5 102kg.  Spoke with RN who will obtain a post wt.  5.  Anemia ckd - Hb 10.9, not on esa as OP 6.   PAF - on asa 7.  Metabolic bone disease/ ckd - cont hect, sensipar, velphoro/ Nehemiah Settle for d/c from nephrology perspective.      Jannifer Hick MD [facility] Pager [phone]   Recent Labs  Lab 11/12/19 1800 11/13/19 0042 11/14/19 0327 11/15/19 0505 11/16/19 0823 11/17/19 0223  K 4.2   < > 4.1   < > 3.9 3.9  BUN 48*   < > 29*   < > 25* 35*  CREATININE 10.40*   < > 7.84*   < > 7.93* 9.32*  CALCIUM 8.5*   < > 8.6*   < > 9.2 8.7*  PHOS 11.5*  --  7.2*  --    --   --   HGB 11.1*   < > 11.4*   < > 11.2* 10.9*   < > = values in this interval not displayed.   Inpatient medications: . aspirin EC  81 mg Oral Daily  . atorvastatin  80 mg Oral Daily  . Chlorhexidine Gluconate Cloth  6 each Topical Q0600  . cinacalcet  30 mg Oral Q M,W,F-HD  . doxercalciferol  11 mcg Intravenous Q M,W,F-HD  . ferric citrate  420 mg Oral TID WC  . heparin  5,000 Units Subcutaneous Q8H  . heparin  8,000 Units Dialysis Once in dialysis  . insulin aspart  0-15 Units Subcutaneous TID WC  . sucroferric oxyhydroxide  1,000 mg Oral TID WC    acetaminophen, docusate sodium, guaiFENesin, heparin, polyethylene glycol, sodium chloride

## 2019-11-18 ENCOUNTER — Telehealth: Payer: Self-pay

## 2019-11-18 ENCOUNTER — Telehealth: Payer: Self-pay | Admitting: Nephrology

## 2019-11-18 DIAGNOSIS — E11649 Type 2 diabetes mellitus with hypoglycemia without coma: Secondary | ICD-10-CM

## 2019-11-18 NOTE — Telephone Encounter (Signed)
Patient requests samples of Levemir due to insurance issues and medication not being covered.  Our clinic has samples of Tresiba instead of Levemir, which is a 1:1 insulin conversion.  Patient reports she is taking 50 units of Levemir in the evening.  Instructed patient that she will take the same amount of insulin with [place].  Patient verbalized understanding.  She requests that she receive enough samples to last through the end of the year as her insurance will not cover until beginning of next year.    Samples will be left in the refrigerator for patient to pick up 11/19/19.

## 2019-11-18 NOTE — Telephone Encounter (Signed)
Patient calls nurse line requesting levemir samples. Patient reports having issues with insurance and insulin will not be covered until 11/15.   Please advise if patient can receive samples.   Talbot Grumbling, RN

## 2019-11-18 NOTE — Telephone Encounter (Signed)
Transition of care contact from inpatient facility  Date of Discharge:  11/17/19 Date of Contact: 11/18/19 Method of contact: Phone  Attempted to contact patient to discuss transition of care from inpatient admission. Patient did not answer the phone. Mailbox full and unable to leave VM.

## 2019-11-19 ENCOUNTER — Telehealth: Payer: Self-pay | Admitting: Nephrology

## 2019-11-19 MED ORDER — TRESIBA FLEXTOUCH 100 UNIT/ML ~~LOC~~ SOPN: 50.0000 [IU] | PEN_INJECTOR | Freq: Every morning | SUBCUTANEOUS | 0 refills | Status: DC

## 2019-11-19 NOTE — Telephone Encounter (Signed)
Noted and agree. 

## 2019-11-19 NOTE — Hospital Course (Signed)
Rachel Chaney is a 59 y.o. female presenting with seizure-like activity and vomiting. PMH is significant for ESRD on HD MWF, T2DM, Macrocytic anemia, HLD, HTN, GERD and H/o Paroxysmal Afib.   AMS/Seizure-like activity Patient presented after having seizure-like activity at home.  On arrival was hypoxic on RA in the 70%'s.  This improved with non-rebreather, no hypotension, fever or tachycardia.   XR concerning for bibasilar infiltrates and initial WBC 22k,  CT head and MRI were obtained and showed no acute process.  Believe cuse to be due to Hybercarbia likely due to obstructive sleep apnea.  Patient had several episodes of delerium at night.  Was episode free for 48 hours prior to discharge with use of BiPAP at night.   Acute Hypoxemic and Hypercarbic respiratory failure Concern for pneumonia initially and concern this was cause of patient's symptoms.  Chest X-Ray obtained and showed bibasilar atelectasis and airspace disease.  Patient received 2 days of antibiotics before it was determined pneumonia was unlikely.  Patient had ABG that showed Co2 level of 106 and patient was transferred to CCM due to concern for respiratory failure and altered mental status.  Was bale to be weaned down to home dose of 3L of O2 with use of BiPAP at night.  Discharged on home BiPAP and 3L O2.    Type 2 diabetes: Patient uses 90u (50u AM, 40u PM) Levemir at home. A1c this admission 9.0. Started on Levemir 25 mg BID. Patient had hypoglycemic event and started on D10 gtt.  Patient's blood sugar normalized and patient   ESRD on HD Patient received regularly scheduled MWF hemodialysis while in hospital.  Was at dry weight by time of D/C.

## 2019-11-19 NOTE — Telephone Encounter (Signed)
Medication Samples have been provided to the patient.  Placed in refrigerator  Drug name: Tyler Aas       Strength: 100mg /ml pen        Qty: 7   LOT: [id number]  Exp.Date: 03/08/21 Drug name: Tyler Aas       Strength: 100mg /ml pen        Qty: 2   LOT: [id number]  Exp.Date: 03/08/21  Dosing instructions: Inject 50 units once daily  The patient has been instructed regarding the correct time, dose, and frequency of taking this medication, including desired effects and most common side effects.   Dimple Nanas, PharmD PGY-1 Acute Care Pharmacy Resident 11/19/2019 8:14 AM

## 2019-11-19 NOTE — Discharge Summary (Addendum)
[facility] Discharge Summary  Patient name: Rachel MALECKI Medical record number: [id number] Date of birth: 1960/08/26 Age: 59 y.o. Gender: female Date of Admission: 11/11/2019  Date of Discharge: 11/17/19 Admitting Physician: Zenia Resides, MD  Primary Care Provider: Benay Pike, MD Consultants: Neurology, Nephrology  Indication for Hospitalization: Altered Mental Status  Discharge Diagnoses/Problem List:  ESRD on HD MWF, T2DM, Macrocytic anemia, HLD, HTN, GERD and H/o Paroxysmal Afib.  Disposition: Able to be discharged home safely  Discharge Condition: Stable  Discharge Exam:   Constitutional:      Appearance: Normal appearance.  HENT:     Head: Normocephalic and atraumatic.     Mouth/Throat:     Mouth: Mucous membranes are moist.  Cardiovascular:     Rate and Rhythm: Normal rate and regular rhythm.  Pulmonary:     Effort: Pulmonary effort is normal.     Breath sounds: Normal breath sounds.  Abdominal:     General: Abdomen is flat.     Palpations: Abdomen is soft.     Tenderness: There is no abdominal tenderness.  Skin:    General: Skin is warm.  Neurological:     Mental Status: She is alert.     Brief Hospital Course:  Rachel Chaney is a 59 y.o. female presented with seizure-like activity and vomiting. PMH is significant for ESRD on HD MWF, T2DM, Macrocytic anemia, HLD, HTN, GERD and H/o Paroxysmal Afib.  AMS/Seizure-like activity Patient presented after having seizure-like activity at home.  On arrival was hypoxic on RA in the 70%'s.  This improved with non-rebreather, no hypotension, fever or tachycardia.  CXR concerning for bibasilar infiltrates and initial WBC 22k,  CT head and MRI were obtained and showed no acute process.  Believe cause to be due to hybercarbia likely due to obstructive sleep apnea.  Patient had several episodes of delerium at night.  Was episode free for 48 hours prior to discharge with use of BiPAP  at night.   Acute Hypoxemic and Hypercarbic respiratory failure Concern for pneumonia initially and concern this was cause of patient's symptoms.  Chest X-Ray obtained and showed bibasilar atelectasis and airspace disease.  Patient's respiratory status continued to decline while in the ED with ABG CO2 106 and pH of 7.0.  Patient started on BiPAP but continued to be somnolent and was sent to ICU on 11/3.  While in the ICU, she did not require any intubation and her condition improved with noninvasive ventilation and transferred back to the family medicine service on the morning of 11/14/2019.  Ultimately, patient's respiratory failure was determined to be due to undiagnosed OSA rather than infectious cause and antibiotics were discontinued.  For the remainder of the admission, patient's hypoxemia improved and was stable on 3L O2 (which patient was on prior to admission) and nightly BiPAP.   Discharged home with BiPAP machine (unable to get CPAP without sleep study) and counseled on importance of compliance. She was instructed to continue her home 3L O2 which she used prior to admission. Patient was referred to outpatient pulmonology for sleep study.   Type 2 diabetes: Patient uses 90u (50u AM, 40u PM) Levemir at home. A1c this admission 9.0. Started on Levemir 25 mg BID. Patient had a single hypoglycemic event in the ICU, likely due to being NPO with NIV and improved with D10 gtt. patient's blood sugars remained stable through the remainder of her hospitalization.   ESRD on HD Patient received regularly scheduled MWF hemodialysis  while in hospital.  Was at dry weight by time of D/C.  Issues for Follow Up:  1. Obstructive Sleep Apnea-  Ensure patient not having issues with BiPAP machine and is using every night.  Patient has referral placed with Pulmonology for sleep study.  Very important patient goes to this. 2. AMS/Seizure-ike activity-  F/u with patient if repeat episodes have occurred at night or  other times.  3. Poorly controlled diabetes with A1C 9.0. Continue outpatient management.   Significant Procedures: Hemodialysis  Significant Labs and Imaging:  Recent Labs  Lab 11/15/19 0505 11/16/19 0823 11/17/19 0223  WBC 16.2* 15.8* 16.1*  HGB 11.4* 11.2* 10.9*  HCT 38.5 36.5 35.3*  PLT 177 194 187   Recent Labs  Lab 11/13/19 0042 11/13/19 0042 11/14/19 0327 11/14/19 0327 11/15/19 0505 11/15/19 0505 11/16/19 0823 11/17/19 0223  NA 139  --  133*  --  137  --  138 135  K 3.6   < > 4.1   < > 3.8   < > 3.9 3.9  CL 97*  --  94*  --  98  --  95* 96*  CO2 27  --  26  --  24  --  29 26  GLUCOSE 84  --  284*  --  93  --  130* 103*  BUN 18  --  29*  --  14  --  25* 35*  CREATININE 6.15*  --  7.84*  --  5.63*  --  7.93* 9.32*  CALCIUM 8.9  --  8.6*  --  9.2  --  9.2 8.7*  MG 1.7  --  1.8  --   --   --   --   --   PHOS  --   --  7.2*  --   --   --   --   --   ALKPHOS 57  --  64  --  61  --  95 87  AST 18  --  22  --  19  --  17 15  ALT 20  --  19  --  19  --  18 17  ALBUMIN 3.1*  --  2.9*  --  2.8*  --  2.9* 2.7*   < > = values in this interval not displayed.    ABG    Component Value Date/Time   PHART 7.309 (L) 11/15/2019 0503   PCO2ART 60.1 (H) 11/15/2019 0503   PO2ART 77.6 (L) 11/15/2019 0503   HCO3 29.1 (H) 11/15/2019 0503   TCO2 28 11/12/2019 0948   ACIDBASEDEF 2.1 (H) 11/12/2019 0125   O2SAT 94.6 11/15/2019 0503    Results/Tests Pending at Time of Discharge: None  Discharge Medications:  Allergies as of 11/17/2019   No Known Allergies     Medication List    TAKE these medications   acetaminophen 500 MG tablet Commonly known as: TYLENOL Take 1,000 mg by mouth every 6 (six) hours as needed for pain.   aspirin EC 81 MG tablet Take 1 tablet (81 mg total) by mouth daily.   atorvastatin 80 MG tablet Commonly known as: LIPITOR TAKE 1 TABLET(80 MG) BY MOUTH AT BEDTIME What changed: See the new instructions.   cadexomer iodine 0.9 % gel Commonly known  as: IODOSORB Apply to left 5th digit wounds once daily   cetirizine 10 MG tablet Commonly known as: ZYRTEC Take 10 mg by mouth as needed for allergies.   cyclobenzaprine 10 MG tablet Commonly known as:  FLEXERIL Take 1 tablet (10 mg total) by mouth 3 times/day as needed-between meals & bedtime for muscle spasms.   ferric citrate 1 GM 210 MG(Fe) tablet Commonly known as: Auryxia Take 2 tablets (420 mg total) by mouth 3 (three) times daily with meals. Give two (420 mg) by mouth three times a day wih meals for ESRD What changed:   how much to take  when to take this  additional instructions   fluticasone 50 MCG/ACT nasal spray Commonly known as: FLONASE Place 1 spray into both nostrils daily.   glucose blood test strip Commonly known as: ONE TOUCH ULTRA TEST 1 each by Other route 3 (three) times daily. ICD-10 code: E11.40.   hydrOXYzine 25 MG tablet Commonly known as: ATARAX/VISTARIL Take 25 mg by mouth 2 (two) times daily as needed for anxiety.   Insulin Pen Needle 29G X 12MM Misc Inject 1 pen into the skin 2 (two) times daily. Check blood sugar daily.   B-D UF III MINI PEN NEEDLES 31G X 5 MM Misc Generic drug: Insulin Pen Needle USE AS DIRECTED TWICE DAILY   Insulin Syringe-Needle U-100 30G X 5/16" 1 ML Misc Commonly known as: UltiCare Insulin Syringe USE AS DIRECTED   MIRCERA IJ Mircera   multivitamin Tabs tablet Take 1 tablet by mouth daily.   nitroGLYCERIN 0.2 mg/hr patch Commonly known as: NITRODUR - Dosed in mg/24 hr Place 1 patch (0.2 mg total) onto the skin daily.   ONE TOUCH ULTRA 2 w/Device Kit 1 kit by Does not apply route 3 (three) times daily. ICD-10 code: T02.40   OneTouch Delica Lancets 97D Misc 1 Device by Does not apply route as needed (to check blood glucose).   PARSABIV IV Inject into the vein.   pentoxifylline 400 MG CR tablet Commonly known as: TRENTAL Take 1 tablet (400 mg total) by mouth 3 (three) times daily with meals.             Durable Medical Equipment  (From admission, onward)         Start     Ordered   11/17/19 0000  For home use only DME oxygen       Question Answer Comment  Length of Need Lifetime   Frequency Continuous (stationary and portable oxygen unit needed)   Oxygen delivery system Gas      11/17/19 1657   11/16/19 0000  For home use only DME continuous positive airway pressure (CPAP)       Comments: Hypercapnic overnight in hospital with delerium, schedule sleep study outpatient  Question Answer Comment  Length of Need Lifetime   Patient has OSA or probable OSA Yes   Is the patient currently using CPAP in the home No   Settings Other see comments   CPAP supplies needed Mask, headgear, cushions, filters, heated tubing and water chamber      11/16/19 1106          Discharge Instructions: Please refer to Patient Instructions section of EMR for full details.  Patient was counseled important signs and symptoms that should prompt return to medical care, changes in medications, dietary instructions, activity restrictions, and follow up appointments.   Follow-Up Appointments:  Follow-up Information    [facility] [place] .   Specialty: [place] Follow up.   Why: Your home healthy has been been set up through [place]. The agency will call you with the start of service dates.  Contact information: [phone]              Delora Fuel, MD 11/19/2019, 11:16 PM PGY-1, [place] Upper-Level Resident Addendum I have discussed the above with the original author and agree with their documentation. My edits for correction/addition/clarification are included. Please see also any attending notes.   Wilber Oliphant, M.D.  PGY-3 11/20/2019 3:04 PM

## 2019-11-19 NOTE — Telephone Encounter (Signed)
Transition of care contact from inpatient facility  Date of discharge: 11/17/19 Date of contact: 11/19/19 Method: Phone Spoke to: Patient  Patient contacted to discuss transition of care from recent inpatient hospitalization. Patient was admitted to [facility] from 11/2-11/8/21 with discharge diagnosis of AMS 2/2 acute hypercarbic respiratory failure.   Patient will follow up with his/her outpatient HD unit on: Patient went to dialysis today. Feels ok, just tired. Next dialysis will be 11/19/19.

## 2019-11-20 ENCOUNTER — Telehealth: Payer: Self-pay

## 2019-11-20 NOTE — Telephone Encounter (Signed)
Amber, [place] PT, calls nurse line requesting verbal orders for home health.   1x a week for 1 week  2x a week for 1 week  1x a week for 1 week  2x a week for 2 weeks 1x a week for 4 weeks   Verbal order given per [facility] protocol.

## 2019-11-21 ENCOUNTER — Telehealth: Payer: Self-pay

## 2019-11-21 ENCOUNTER — Ambulatory Visit: Payer: Medicare Other | Admitting: Family Medicine

## 2019-11-21 NOTE — Telephone Encounter (Signed)
Patient calls nurse line requesting prescription for hemorrhoids. Patient reports she has tried OTC medications with no relief. Please advise.

## 2019-11-21 NOTE — Telephone Encounter (Signed)
Samples provided to patients home health worker on 11/20/2019.

## 2019-11-23 ENCOUNTER — Other Ambulatory Visit: Payer: Self-pay | Admitting: Family Medicine

## 2019-11-23 MED ORDER — BENZOCAINE (TOPICAL) 20 % EX OINT: TOPICAL_OINTMENT | CUTANEOUS | 1 refills | Status: DC

## 2019-11-23 NOTE — Telephone Encounter (Signed)
Prescribed benzocaine ointment.  Pt to use up to every 4 hours as needed.  Tried to call pt but no answer and unable to leave voicemail.

## 2019-11-24 NOTE — Telephone Encounter (Signed)
Attempted to call patient to inform of medication to pharmacy. However, unable to LVM as voicemail box is not set up.

## 2019-11-25 ENCOUNTER — Other Ambulatory Visit: Payer: Self-pay

## 2019-11-25 ENCOUNTER — Encounter: Payer: Self-pay | Admitting: Family Medicine

## 2019-11-25 ENCOUNTER — Other Ambulatory Visit (HOSPITAL_COMMUNITY)
Admission: RE | Admit: 2019-11-25 | Discharge: 2019-11-25 | Disposition: A | Discharge status: Home / Self Care | Payer: Medicare Other | Source: Ambulatory Visit | Attending: Family Medicine | Admitting: Family Medicine

## 2019-11-25 ENCOUNTER — Ambulatory Visit (INDEPENDENT_AMBULATORY_CARE_PROVIDER_SITE_OTHER): Payer: Medicare Other | Admitting: Family Medicine

## 2019-11-25 VITALS — BP 144/58 | HR 56 | Ht 64.0 in | Wt 223.0 lb

## 2019-11-25 DIAGNOSIS — G4733 Obstructive sleep apnea (adult) (pediatric): Secondary | ICD-10-CM | POA: Diagnosis not present

## 2019-11-25 DIAGNOSIS — E1140 Type 2 diabetes mellitus with diabetic neuropathy, unspecified: Secondary | ICD-10-CM

## 2019-11-25 DIAGNOSIS — Z01419 Encounter for gynecological examination (general) (routine) without abnormal findings: Secondary | ICD-10-CM | POA: Insufficient documentation

## 2019-11-25 DIAGNOSIS — Z794 Long term (current) use of insulin: Secondary | ICD-10-CM | POA: Diagnosis not present

## 2019-11-25 DIAGNOSIS — N950 Postmenopausal bleeding: Secondary | ICD-10-CM | POA: Diagnosis not present

## 2019-11-25 DIAGNOSIS — R21 Rash and other nonspecific skin eruption: Secondary | ICD-10-CM | POA: Diagnosis not present

## 2019-11-25 DIAGNOSIS — K649 Unspecified hemorrhoids: Secondary | ICD-10-CM

## 2019-11-25 DIAGNOSIS — Z23 Encounter for immunization: Secondary | ICD-10-CM

## 2019-11-25 DIAGNOSIS — N939 Abnormal uterine and vaginal bleeding, unspecified: Secondary | ICD-10-CM | POA: Diagnosis present

## 2019-11-25 DIAGNOSIS — Z1151 Encounter for screening for human papillomavirus (HPV): Secondary | ICD-10-CM | POA: Insufficient documentation

## 2019-11-25 DIAGNOSIS — Z1211 Encounter for screening for malignant neoplasm of colon: Secondary | ICD-10-CM | POA: Diagnosis not present

## 2019-11-25 MED ORDER — HYDROCORTISONE 2.5 % EX OINT: TOPICAL_OINTMENT | Freq: Two times a day (BID) | CUTANEOUS | 3 refills | Status: DC

## 2019-11-25 NOTE — Assessment & Plan Note (Signed)
Pt compliant with bipap at home. No questions about its use.  Encouraged pt to use as much as tolerated.

## 2019-11-25 NOTE — Assessment & Plan Note (Signed)
Pt taking 50 U tresiba.  Gave pt worksheet and asked her to document am fasting and pm pre-insulin blood glucose levels until our next visit on 12/2.  Can adjust insulin regimen based on that information.

## 2019-11-25 NOTE — Assessment & Plan Note (Signed)
Significant amount of vaginal bleeding. Pt was unsure if she was having vaginal or rectal bleeding given her complaints of rectal pain.  Previous endometrial biopsy in 2017 was reportedly normal. May be false negative.  Regardless, I don't believe a cause for her vaginal bleeding was ever found.  Will re-do endometrial biopsy later this week.  Pap smear performed today. If biopsy normal will consider ultrasound and sonohystogram as per previous u/s recommendations.  If pap abnormal, pt already has colpo clinic appt scheduled for 12/2.

## 2019-11-25 NOTE — Assessment & Plan Note (Signed)
The patient has a small anterior external hemorrhoid, which may be the cause of her rectal discomfort.  Pt also has visible fecal matter around the anus, which could also be contributing to her pruritus.  Given her vaginal bleeding, and the possiblity of rectal bleeding as well ( pt unable to tell the difference), will get colonoscopy, which pt has never had performed.  Will send in GI referral.  Pt has already been prescribed a topical medication for her pruritus, which she has not picked up yet.

## 2019-11-25 NOTE — Progress Notes (Signed)
SUBJECTIVE:   CHIEF COMPLAINT / HPI:   Post menopausal bleeding: for the past two weeks pt states she has been having vaginal bleeding..  No spotting prior to this since her previous pap smear. Last menstrual period was several years ago.   OSA: patient is using her bipap most nights.  Does not have problems with using it.    DM: pt currently taking tresiba 50 U at night.  She will miss a dose maybe once a week.  She takes her blood glucose fasting and at lunch.    Hemorrhoids: patient having  Itching and rectal pain.  Worse with bowel movements.  She is open to getting a colonoscopy.   Rash:  Pt states for the past 2-3 weeks she has been having pruritic spots on both legs.  She has not noticed any bleeding form them, even after scratching.   PERTINENT  PMH / PSH:   OBJECTIVE:   BP (!) 144/58   Pulse (!) 56   Ht 5\' 4"  (1.626 m)   Wt 223 lb (101.2 kg)   LMP 09/18/2011   SpO2 98%   BMI 38.28 kg/m   General: alert.  Oriented. Appears older than stated age.   CV: RRR.  GU: moderate amount of vaginal bleeding that appears to be coming from the os.  Cluster of pearly verrucous papules seen on the cervix. Small 41mm piece of tissue separated from vaginal wall and was removed with speculum.   GI: rectal exam positive for feces seen around the anus.  Small, tender external hemorrhoid seen at the anterior aspect of the anus.   No fissures. No bleeding.   ASSESSMENT/PLAN:   Post-menopausal bleeding Significant amount of vaginal bleeding. Pt was unsure if she was having vaginal or rectal bleeding given her complaints of rectal pain.  Previous endometrial biopsy in 2017 was reportedly normal. May be false negative.  Regardless, I don't believe a cause for her vaginal bleeding was ever found.  Will re-do endometrial biopsy later this week.  Pap smear performed today. If biopsy normal will consider ultrasound and sonohystogram as per previous u/s recommendations.  If pap abnormal, pt already  has colpo clinic appt scheduled for 12/2.    Hemorrhoids The patient has a small anterior external hemorrhoid, which may be the cause of her rectal discomfort.  Pt also has visible fecal matter around the anus, which could also be contributing to her pruritus.  Given her vaginal bleeding, and the possiblity of rectal bleeding as well ( pt unable to tell the difference), will get colonoscopy, which pt has never had performed.  Will send in GI referral.  Pt has already been prescribed a topical medication for her pruritus, which she has not picked up yet.   Papular rash Small 2-55mm scattered papules on the lower legs bilaterally. Pt endorses pruritus.  No signs of bleeding seen.  Uncertain what the cause is.  Does not appear to be vascular.  May represent insect bites.  Will likely need to get biopsy in the future.  Will prescribe topical steroid in the meantime for relief from pruritus. Advised pt to stop steroid if pruritus worsens or rash spreads.    Obstructive sleep apnea Pt compliant with bipap at home. No questions about its use.  Encouraged pt to use as much as tolerated.   Type 2 diabetes mellitus with diabetic neuropathy (HCC) Pt taking 50 U tresiba.  Gave pt worksheet and asked her to document am fasting and pm pre-insulin  blood glucose levels until our next visit on 12/2.  Can adjust insulin regimen based on that information.       Benay Pike, MD [place]

## 2019-11-25 NOTE — Patient Instructions (Signed)
It was nice to see you today,  I would like to schedule a new appointment later this week to do a endometrial biopsy.  I have also scheduled appointment with me on 2 December so we can discuss your diabetes.  I would like you to check your blood sugar levels once at breakfast prior to eating and then again at bedtime before you give yourself your insulin.  I would like you to write these down on the sheets that I gave you.  In 2 weeks please bring the sheet back to me and so we can discuss your changes to your diabetes medications.  I have prescribed you a hydrocortisone cream to put on your legs.  If that is making things worse please stop doing it.  I would also get a biopsy most likely the next time I see you.  I have ordered you a colonoscopy.  Somebody should call you to make the schedule of the appointment.  We are working on getting your scheduling for your endometrial biopsy.  Please make that appointment so that we can test you for causes of your bleeding.  Have a great day,  Clemetine Marker, MD

## 2019-11-25 NOTE — Assessment & Plan Note (Signed)
Small 2-63mm scattered papules on the lower legs bilaterally. Pt endorses pruritus.  No signs of bleeding seen.  Uncertain what the cause is.  Does not appear to be vascular.  May represent insect bites.  Will likely need to get biopsy in the future.  Will prescribe topical steroid in the meantime for relief from pruritus. Advised pt to stop steroid if pruritus worsens or rash spreads.

## 2019-11-26 LAB — CBC
Hematocrit: 36.3 % (ref 34.0–46.6)
Hemoglobin: 12.5 g/dL (ref 11.1–15.9)
MCH: 33.5 pg — ABNORMAL HIGH (ref 26.6–33.0)
MCHC: 34.4 g/dL (ref 31.5–35.7)
MCV: 97 fL (ref 79–97)
Platelets: 391 10*3/uL (ref 150–450)
RBC: 3.73 x10E6/uL — ABNORMAL LOW (ref 3.77–5.28)
RDW: 15 % (ref 11.7–15.4)
WBC: 20.5 10*3/uL (ref 3.4–10.8)

## 2019-11-26 NOTE — Progress Notes (Signed)
    SUBJECTIVE:   CHIEF COMPLAINT / HPI:   Post menopausal bleeding: patient states her bleeding has improved.     PERTINENT  PMH / PSH:   OBJECTIVE:   BP 130/62   Pulse 95   Ht 5\' 4"  (1.626 m)   Wt 221 lb 12.8 oz (100.6 kg)   LMP 09/18/2011   SpO2 91%   BMI 38.07 kg/m   Gen: alert. Oriented.   GU: minimal bleeding seen in the vaginal canal. No lesions seen on the cervix.    ASSESSMENT/PLAN:   Post-menopausal bleeding Improved bleeding today.  Successful biopsy.  Will update pt with results.  Follow up scheduled for 12/2.    Leukocytosis Wbc > 20k on last cbc.  Will get cbc w/ differential today.     PROCEDURE NOTE: Endometrial Biopsy Patient given informed consent, signed copy in the chart.  Appropriate time out taken. . The patient was placed in the lithotomy position and the cervix brought into view with sterile speculum.  The portio of cervix was cleansed x 3 with betadine swabs.  A tenaculum was placed in the anterior lip of the cervix.  A uterine sound was used to measure the uterus.. A pipelle was introduced  into the uterus, suction created,  and an endometrial sample was obtained. All equipment was removed and accounted for.   The patient tolerated the procedure well.  Minimal spotting type bleeding.  Patient given post procedure instructions. I will notify her of any pathology results.  Benay Pike, MD [place]

## 2019-11-27 ENCOUNTER — Ambulatory Visit (INDEPENDENT_AMBULATORY_CARE_PROVIDER_SITE_OTHER): Payer: Medicare Other | Admitting: Family Medicine

## 2019-11-27 ENCOUNTER — Encounter: Payer: Self-pay | Admitting: Family Medicine

## 2019-11-27 ENCOUNTER — Other Ambulatory Visit: Payer: Self-pay

## 2019-11-27 VITALS — BP 130/62 | HR 95 | Ht 64.0 in | Wt 221.8 lb

## 2019-11-27 DIAGNOSIS — D72829 Elevated white blood cell count, unspecified: Secondary | ICD-10-CM

## 2019-11-27 DIAGNOSIS — N950 Postmenopausal bleeding: Secondary | ICD-10-CM | POA: Diagnosis not present

## 2019-11-27 NOTE — Patient Instructions (Addendum)
It was nice to see you again today,  You still have a scheduled appointment with me on December 2.  Please keep this appointment.  You do not need to keep the colposcopy clinic that was also scheduled on the same day.  Would also like to get a blood test today because your white count was high on the last blood test we did.  We can discuss this results when I see you again on 2 December.  If your bleeding gets significantly worse or if you become feverish or feel like you have an infection, please call our office to let us know.    Have a great day,  Clemetine Marker, MD

## 2019-11-27 NOTE — Assessment & Plan Note (Signed)
Wbc > 20k on last cbc.  Will get cbc w/ differential today.

## 2019-11-27 NOTE — Assessment & Plan Note (Signed)
Improved bleeding today.  Successful biopsy.  Will update pt with results.  Follow up scheduled for 12/2.

## 2019-11-28 LAB — CYTOLOGY - PAP
Comment: NEGATIVE
Diagnosis: NEGATIVE
High risk HPV: NEGATIVE

## 2019-12-02 ENCOUNTER — Other Ambulatory Visit: Payer: Self-pay | Admitting: Family Medicine

## 2019-12-02 ENCOUNTER — Telehealth: Payer: Self-pay | Admitting: Family Medicine

## 2019-12-02 NOTE — Telephone Encounter (Signed)
Handicap placard app placed in nurse box.  Please let pt know she can pick this up at her earliest convenience.

## 2019-12-05 ENCOUNTER — Other Ambulatory Visit: Payer: Self-pay | Admitting: Family Medicine

## 2019-12-09 NOTE — Telephone Encounter (Signed)
Patient contacted and informed of disability placard up front for pick up. Patient informed to make sure she fills out her portion before turning in to [facility].

## 2019-12-10 ENCOUNTER — Telehealth: Payer: Self-pay

## 2019-12-10 NOTE — Telephone Encounter (Signed)
Patient calls nurse line requesting new ointment or cream for itchy bumps on her legs. Patient reports that current ointment is not working. Advised patient that she may need follow up appointment for adjustment/ addition of new medications. Patient was previously scheduled with PCP for tomorrow morning, however, reports that she will not be able to come in and is asking to cancel appointments. Appointments cancelled per patient's request. Patient will return call to office to schedule when she is available.   Please advise.   Talbot Grumbling, RN

## 2019-12-11 ENCOUNTER — Other Ambulatory Visit: Payer: Self-pay | Admitting: Family Medicine

## 2019-12-11 ENCOUNTER — Ambulatory Visit: Payer: Medicare Other

## 2019-12-11 ENCOUNTER — Ambulatory Visit: Payer: Medicare Other | Admitting: Family Medicine

## 2019-12-11 MED ORDER — PRAMOXINE-HC 1-2.5 % EX LOTN: TOPICAL_LOTION | CUTANEOUS | 1 refills | Status: DC

## 2019-12-11 NOTE — Telephone Encounter (Signed)
Attempted to contact patient to inform of below and schedule appointment. Patient did not answer and voicemail box was full.   Talbot Grumbling, RN

## 2019-12-11 NOTE — Telephone Encounter (Signed)
Please let pt know I sent in a new topical prescription and that I would like her to reschedule her appt so I can get a tissue sample of her rash.

## 2019-12-12 ENCOUNTER — Telehealth: Payer: Self-pay | Admitting: *Deleted

## 2019-12-12 NOTE — Telephone Encounter (Signed)
Pt calls requesting something for her fatigue. Advised that she would need an appt to discuss, but she declines.  She ask that I "please send the message to Dr. Jeannine Kitten" to see if he has any suggestions. Christen Bame, CMA

## 2019-12-12 NOTE — Telephone Encounter (Signed)
Patient calls back.  The lotion that was called in will cost her $300 and she will need something cheaper. Christen Bame, CMA

## 2019-12-15 ENCOUNTER — Ambulatory Visit (INDEPENDENT_AMBULATORY_CARE_PROVIDER_SITE_OTHER): Payer: Medicare Other | Admitting: Family Medicine

## 2019-12-15 ENCOUNTER — Encounter: Payer: Self-pay | Admitting: Family Medicine

## 2019-12-15 ENCOUNTER — Other Ambulatory Visit: Payer: Self-pay

## 2019-12-15 ENCOUNTER — Inpatient Hospital Stay (HOSPITAL_COMMUNITY)
Admission: EM | Admit: 2019-12-15 | Discharge: 2019-12-18 | DRG: 640 | Disposition: A | Discharge status: Home / Self Care | Payer: Medicare Other | Attending: Family Medicine | Admitting: Family Medicine

## 2019-12-15 ENCOUNTER — Encounter (HOSPITAL_COMMUNITY): Payer: Self-pay | Admitting: Emergency Medicine

## 2019-12-15 DIAGNOSIS — I480 Paroxysmal atrial fibrillation: Secondary | ICD-10-CM | POA: Diagnosis present

## 2019-12-15 DIAGNOSIS — E1140 Type 2 diabetes mellitus with diabetic neuropathy, unspecified: Secondary | ICD-10-CM | POA: Diagnosis present

## 2019-12-15 DIAGNOSIS — Z9115 Patient's noncompliance with renal dialysis: Secondary | ICD-10-CM

## 2019-12-15 DIAGNOSIS — I5032 Chronic diastolic (congestive) heart failure: Secondary | ICD-10-CM | POA: Diagnosis present

## 2019-12-15 DIAGNOSIS — E11649 Type 2 diabetes mellitus with hypoglycemia without coma: Secondary | ICD-10-CM | POA: Diagnosis present

## 2019-12-15 DIAGNOSIS — Z794 Long term (current) use of insulin: Secondary | ICD-10-CM

## 2019-12-15 DIAGNOSIS — Z992 Dependence on renal dialysis: Secondary | ICD-10-CM

## 2019-12-15 DIAGNOSIS — D539 Nutritional anemia, unspecified: Secondary | ICD-10-CM | POA: Diagnosis present

## 2019-12-15 DIAGNOSIS — I132 Hypertensive heart and chronic kidney disease with heart failure and with stage 5 chronic kidney disease, or end stage renal disease: Secondary | ICD-10-CM | POA: Diagnosis present

## 2019-12-15 DIAGNOSIS — N186 End stage renal disease: Secondary | ICD-10-CM

## 2019-12-15 DIAGNOSIS — E1122 Type 2 diabetes mellitus with diabetic chronic kidney disease: Secondary | ICD-10-CM | POA: Diagnosis present

## 2019-12-15 DIAGNOSIS — N2581 Secondary hyperparathyroidism of renal origin: Secondary | ICD-10-CM | POA: Diagnosis present

## 2019-12-15 DIAGNOSIS — Z8674 Personal history of sudden cardiac arrest: Secondary | ICD-10-CM

## 2019-12-15 DIAGNOSIS — K219 Gastro-esophageal reflux disease without esophagitis: Secondary | ICD-10-CM | POA: Diagnosis present

## 2019-12-15 DIAGNOSIS — J9601 Acute respiratory failure with hypoxia: Secondary | ICD-10-CM

## 2019-12-15 DIAGNOSIS — E162 Hypoglycemia, unspecified: Secondary | ICD-10-CM

## 2019-12-15 DIAGNOSIS — Z20822 Contact with and (suspected) exposure to covid-19: Secondary | ICD-10-CM | POA: Diagnosis present

## 2019-12-15 DIAGNOSIS — M898X9 Other specified disorders of bone, unspecified site: Secondary | ICD-10-CM | POA: Diagnosis present

## 2019-12-15 DIAGNOSIS — Z8249 Family history of ischemic heart disease and other diseases of the circulatory system: Secondary | ICD-10-CM

## 2019-12-15 DIAGNOSIS — G4733 Obstructive sleep apnea (adult) (pediatric): Secondary | ICD-10-CM | POA: Diagnosis present

## 2019-12-15 DIAGNOSIS — D631 Anemia in chronic kidney disease: Secondary | ICD-10-CM | POA: Diagnosis present

## 2019-12-15 DIAGNOSIS — E8770 Fluid overload, unspecified: Principal | ICD-10-CM | POA: Diagnosis present

## 2019-12-15 DIAGNOSIS — Z7982 Long term (current) use of aspirin: Secondary | ICD-10-CM

## 2019-12-15 DIAGNOSIS — E1165 Type 2 diabetes mellitus with hyperglycemia: Secondary | ICD-10-CM | POA: Diagnosis present

## 2019-12-15 DIAGNOSIS — Z89431 Acquired absence of right foot: Secondary | ICD-10-CM

## 2019-12-15 DIAGNOSIS — E785 Hyperlipidemia, unspecified: Secondary | ICD-10-CM | POA: Diagnosis present

## 2019-12-15 DIAGNOSIS — Z79899 Other long term (current) drug therapy: Secondary | ICD-10-CM

## 2019-12-15 DIAGNOSIS — Z9981 Dependence on supplemental oxygen: Secondary | ICD-10-CM

## 2019-12-15 LAB — CBC
HCT: 30.4 % — ABNORMAL LOW (ref 36.0–46.0)
Hemoglobin: 8.9 g/dL — ABNORMAL LOW (ref 12.0–15.0)
MCH: 32.4 pg (ref 26.0–34.0)
MCHC: 29.3 g/dL — ABNORMAL LOW (ref 30.0–36.0)
MCV: 110.5 fL — ABNORMAL HIGH (ref 80.0–100.0)
Platelets: 339 10*3/uL (ref 150–400)
RBC: 2.75 MIL/uL — ABNORMAL LOW (ref 3.87–5.11)
RDW: 18.6 % — ABNORMAL HIGH (ref 11.5–15.5)
WBC: 25.7 10*3/uL — ABNORMAL HIGH (ref 4.0–10.5)
nRBC: 0.2 % (ref 0.0–0.2)

## 2019-12-15 LAB — BASIC METABOLIC PANEL
Anion gap: 16 — ABNORMAL HIGH (ref 5–15)
BUN: 57 mg/dL — ABNORMAL HIGH (ref 8–23)
CO2: 26 mmol/L (ref 22–32)
Calcium: 9.8 mg/dL (ref 8.9–10.3)
Chloride: 96 mmol/L — ABNORMAL LOW (ref 98–111)
Creatinine, Ser: 11.4 mg/dL — ABNORMAL HIGH (ref 0.44–1.00)
GFR, Estimated: 3 mL/min — ABNORMAL LOW (ref >60)
Glucose, Bld: 65 mg/dL — ABNORMAL LOW (ref 70–99)
Potassium: 4.4 mmol/L (ref 3.5–5.1)
Sodium: 138 mmol/L (ref 135–145)

## 2019-12-15 LAB — CBG MONITORING, ED: Glucose-Capillary: 69 mg/dL — ABNORMAL LOW (ref 70–99)

## 2019-12-15 NOTE — ED Triage Notes (Signed)
Pt reports since after her dialysis treatment of Wednesday she has been feeling fatigue and tired, she missed fridays treatment and did not go today for her treatment.

## 2019-12-15 NOTE — Assessment & Plan Note (Signed)
Clearly volume overloaded.  I am note sure what else is contributing to hypoxia. Needs ER/hospital care with documented hypoxia and 40 lb weight gain.   We will wheel to ER with O2

## 2019-12-15 NOTE — Patient Instructions (Addendum)
To ER  Report given to charge nures.

## 2019-12-15 NOTE — Progress Notes (Signed)
    SUBJECTIVE:   CHIEF COMPLAINT / HPI:   ESRD on dialysis patient feeling bad since her last dialysis 5 days ago.  Patient felt generally weak and "too bad" for dialysis on Friday.  Came to [facility] today instead of dialysis She thinks her dry weight is ~185- which seems right from my chart review  C/O generalized weakness - some mild cough.  No fever.  Has had flu shot and Covid x2 - no booster yet.    No chest pain.  + leg swelling.  Last echo reviewed 2020 had LVH and normal aortic valve.  Degenerative mitral valve.     OBJECTIVE:   BP (!) 142/60   Pulse 84   Ht 5\' 4"  (1.626 m)   Wt 225 lb 3.2 oz (102.2 kg) Comment: pt did not want to weigh in wheel chair  LMP 09/18/2011   SpO2 (!) 78%   BMI 38.66 kg/m   Low pulse ox noted.  Also weight =225.   Speaks in full sentences.  Cardiac Harsh SEM.  RRR Lungs bibasilar rales. Ext 3+ pitting edema.  ASSESSMENT/PLAN:   No problem-specific Assessment & Plan notes found for this encounter.     Zenia Resides, MD [place]

## 2019-12-16 ENCOUNTER — Other Ambulatory Visit: Payer: Self-pay

## 2019-12-16 ENCOUNTER — Emergency Department (HOSPITAL_COMMUNITY): Payer: Medicare Other

## 2019-12-16 DIAGNOSIS — Z9981 Dependence on supplemental oxygen: Secondary | ICD-10-CM | POA: Diagnosis not present

## 2019-12-16 DIAGNOSIS — N2581 Secondary hyperparathyroidism of renal origin: Secondary | ICD-10-CM | POA: Diagnosis present

## 2019-12-16 DIAGNOSIS — E8770 Fluid overload, unspecified: Secondary | ICD-10-CM | POA: Diagnosis present

## 2019-12-16 DIAGNOSIS — I5032 Chronic diastolic (congestive) heart failure: Secondary | ICD-10-CM | POA: Diagnosis present

## 2019-12-16 DIAGNOSIS — Z992 Dependence on renal dialysis: Secondary | ICD-10-CM

## 2019-12-16 DIAGNOSIS — Z20822 Contact with and (suspected) exposure to covid-19: Secondary | ICD-10-CM | POA: Diagnosis present

## 2019-12-16 DIAGNOSIS — E1140 Type 2 diabetes mellitus with diabetic neuropathy, unspecified: Secondary | ICD-10-CM | POA: Diagnosis present

## 2019-12-16 DIAGNOSIS — J9601 Acute respiratory failure with hypoxia: Secondary | ICD-10-CM | POA: Diagnosis present

## 2019-12-16 DIAGNOSIS — E1122 Type 2 diabetes mellitus with diabetic chronic kidney disease: Secondary | ICD-10-CM | POA: Diagnosis present

## 2019-12-16 DIAGNOSIS — Z9115 Patient's noncompliance with renal dialysis: Secondary | ICD-10-CM | POA: Diagnosis not present

## 2019-12-16 DIAGNOSIS — D539 Nutritional anemia, unspecified: Secondary | ICD-10-CM | POA: Diagnosis present

## 2019-12-16 DIAGNOSIS — Z79899 Other long term (current) drug therapy: Secondary | ICD-10-CM | POA: Diagnosis not present

## 2019-12-16 DIAGNOSIS — D649 Anemia, unspecified: Secondary | ICD-10-CM | POA: Diagnosis not present

## 2019-12-16 DIAGNOSIS — M898X9 Other specified disorders of bone, unspecified site: Secondary | ICD-10-CM | POA: Diagnosis present

## 2019-12-16 DIAGNOSIS — D631 Anemia in chronic kidney disease: Secondary | ICD-10-CM

## 2019-12-16 DIAGNOSIS — E8779 Other fluid overload: Secondary | ICD-10-CM | POA: Diagnosis not present

## 2019-12-16 DIAGNOSIS — N186 End stage renal disease: Secondary | ICD-10-CM | POA: Diagnosis present

## 2019-12-16 DIAGNOSIS — E11649 Type 2 diabetes mellitus with hypoglycemia without coma: Secondary | ICD-10-CM | POA: Diagnosis present

## 2019-12-16 DIAGNOSIS — K219 Gastro-esophageal reflux disease without esophagitis: Secondary | ICD-10-CM | POA: Diagnosis present

## 2019-12-16 DIAGNOSIS — E162 Hypoglycemia, unspecified: Secondary | ICD-10-CM | POA: Diagnosis not present

## 2019-12-16 DIAGNOSIS — Z794 Long term (current) use of insulin: Secondary | ICD-10-CM | POA: Diagnosis not present

## 2019-12-16 DIAGNOSIS — G4733 Obstructive sleep apnea (adult) (pediatric): Secondary | ICD-10-CM | POA: Diagnosis present

## 2019-12-16 DIAGNOSIS — Z7982 Long term (current) use of aspirin: Secondary | ICD-10-CM | POA: Diagnosis not present

## 2019-12-16 DIAGNOSIS — E785 Hyperlipidemia, unspecified: Secondary | ICD-10-CM | POA: Diagnosis present

## 2019-12-16 DIAGNOSIS — I132 Hypertensive heart and chronic kidney disease with heart failure and with stage 5 chronic kidney disease, or end stage renal disease: Secondary | ICD-10-CM | POA: Diagnosis present

## 2019-12-16 DIAGNOSIS — I480 Paroxysmal atrial fibrillation: Secondary | ICD-10-CM | POA: Diagnosis present

## 2019-12-16 DIAGNOSIS — E1165 Type 2 diabetes mellitus with hyperglycemia: Secondary | ICD-10-CM | POA: Diagnosis present

## 2019-12-16 HISTORY — DX: Fluid overload, unspecified: E87.70

## 2019-12-16 LAB — CBC WITH DIFFERENTIAL/PLATELET
Abs Immature Granulocytes: 0.38 10*3/uL — ABNORMAL HIGH (ref 0.00–0.07)
Basophils Absolute: 0.1 10*3/uL (ref 0.0–0.1)
Basophils Relative: 0 %
Eosinophils Absolute: 0.7 10*3/uL — ABNORMAL HIGH (ref 0.0–0.5)
Eosinophils Relative: 2 %
HCT: 24.4 % — ABNORMAL LOW (ref 36.0–46.0)
Hemoglobin: 7.5 g/dL — ABNORMAL LOW (ref 12.0–15.0)
Immature Granulocytes: 1 %
Lymphocytes Relative: 4 %
Lymphs Abs: 1.1 10*3/uL (ref 0.7–4.0)
MCH: 33.3 pg (ref 26.0–34.0)
MCHC: 30.7 g/dL (ref 30.0–36.0)
MCV: 108.4 fL — ABNORMAL HIGH (ref 80.0–100.0)
Monocytes Absolute: 1.5 10*3/uL — ABNORMAL HIGH (ref 0.1–1.0)
Monocytes Relative: 6 %
Neutro Abs: 23.5 10*3/uL — ABNORMAL HIGH (ref 1.7–7.7)
Neutrophils Relative %: 87 %
Platelets: 342 10*3/uL (ref 150–400)
RBC: 2.25 MIL/uL — ABNORMAL LOW (ref 3.87–5.11)
RDW: 18.3 % — ABNORMAL HIGH (ref 11.5–15.5)
WBC: 27.3 10*3/uL — ABNORMAL HIGH (ref 4.0–10.5)
nRBC: 0 % (ref 0.0–0.2)

## 2019-12-16 LAB — BASIC METABOLIC PANEL
Anion gap: 21 — ABNORMAL HIGH (ref 5–15)
BUN: 67 mg/dL — ABNORMAL HIGH (ref 8–23)
CO2: 24 mmol/L (ref 22–32)
Calcium: 9.2 mg/dL (ref 8.9–10.3)
Chloride: 93 mmol/L — ABNORMAL LOW (ref 98–111)
Creatinine, Ser: 12.56 mg/dL — ABNORMAL HIGH (ref 0.44–1.00)
GFR, Estimated: 3 mL/min — ABNORMAL LOW (ref >60)
Glucose, Bld: 251 mg/dL — ABNORMAL HIGH (ref 70–99)
Potassium: 5 mmol/L (ref 3.5–5.1)
Sodium: 138 mmol/L (ref 135–145)

## 2019-12-16 LAB — GLUCOSE, CAPILLARY: Glucose-Capillary: 241 mg/dL — ABNORMAL HIGH (ref 70–99)

## 2019-12-16 LAB — CBG MONITORING, ED
Glucose-Capillary: 66 mg/dL — ABNORMAL LOW (ref 70–99)
Glucose-Capillary: 89 mg/dL (ref 70–99)
Glucose-Capillary: 132 mg/dL — ABNORMAL HIGH (ref 70–99)
Glucose-Capillary: 246 mg/dL — ABNORMAL HIGH (ref 70–99)

## 2019-12-16 LAB — PREPARE RBC (CROSSMATCH)

## 2019-12-16 LAB — PHOSPHORUS: Phosphorus: 9.4 mg/dL — ABNORMAL HIGH (ref 2.5–4.6)

## 2019-12-16 LAB — HEMOGLOBIN AND HEMATOCRIT, BLOOD
HCT: 31.2 % — ABNORMAL LOW (ref 36.0–46.0)
Hemoglobin: 9.8 g/dL — ABNORMAL LOW (ref 12.0–15.0)

## 2019-12-16 LAB — ABO/RH: ABO/RH(D): O POS

## 2019-12-16 LAB — RESP PANEL BY RT-PCR (FLU A&B, COVID) ARPGX2
Influenza A by PCR: NEGATIVE
Influenza B by PCR: NEGATIVE
SARS Coronavirus 2 by RT PCR: NEGATIVE

## 2019-12-16 LAB — MAGNESIUM: Magnesium: 2.4 mg/dL (ref 1.7–2.4)

## 2019-12-16 MED ORDER — SODIUM CHLORIDE 0.9% IV SOLUTION: Freq: Once | INTRAVENOUS | Status: DC

## 2019-12-16 MED ORDER — CINACALCET HCL 30 MG PO TABS
30.0000 mg | ORAL_TABLET | ORAL | Status: DC
  Administered 2019-12-17: 30 mg via ORAL
  Filled 2019-12-16: qty 1

## 2019-12-16 MED ORDER — HEPARIN SODIUM (PORCINE) 5000 UNIT/ML IJ SOLN: 5000.0000 [IU] | Freq: Three times a day (TID) | INTRAMUSCULAR | Status: DC

## 2019-12-16 MED ORDER — DARBEPOETIN ALFA 60 MCG/0.3ML IJ SOSY
60.0000 ug | PREFILLED_SYRINGE | INTRAMUSCULAR | Status: DC
  Filled 2019-12-16: qty 0.3

## 2019-12-16 MED ORDER — ACETAMINOPHEN 325 MG PO TABS
650.0000 mg | ORAL_TABLET | Freq: Once | ORAL | Status: AC
  Administered 2019-12-16: 650 mg via ORAL
  Filled 2019-12-16: qty 2

## 2019-12-16 MED ORDER — LIDOCAINE 5 % EX PTCH
1.0000 | MEDICATED_PATCH | CUTANEOUS | Status: DC
  Administered 2019-12-16 – 2019-12-17 (×2): 1 via TRANSDERMAL
  Filled 2019-12-16 (×2): qty 1

## 2019-12-16 MED ORDER — CAMPHOR-MENTHOL 0.5-0.5 % EX LOTN
TOPICAL_LOTION | Freq: Three times a day (TID) | CUTANEOUS | Status: DC | PRN
  Filled 2019-12-16: qty 222

## 2019-12-16 MED ORDER — FLUTICASONE PROPIONATE 50 MCG/ACT NA SUSP
1.0000 | Freq: Every day | NASAL | Status: DC
  Administered 2019-12-17 – 2019-12-18 (×2): 1 via NASAL
  Filled 2019-12-16: qty 16

## 2019-12-16 MED ORDER — ATORVASTATIN CALCIUM 80 MG PO TABS
80.0000 mg | ORAL_TABLET | Freq: Every day | ORAL | Status: DC
  Administered 2019-12-16 – 2019-12-17 (×2): 80 mg via ORAL
  Filled 2019-12-16 (×2): qty 1

## 2019-12-16 MED ORDER — NEPRO/CARBSTEADY PO LIQD
237.0000 mL | Freq: Two times a day (BID) | ORAL | Status: DC
  Filled 2019-12-16: qty 237

## 2019-12-16 MED ORDER — INSULIN ASPART 100 UNIT/ML ~~LOC~~ SOLN
0.0000 [IU] | Freq: Three times a day (TID) | SUBCUTANEOUS | Status: DC
  Administered 2019-12-16 – 2019-12-17 (×2): 2 [IU] via SUBCUTANEOUS
  Administered 2019-12-17: 3 [IU] via SUBCUTANEOUS
  Administered 2019-12-17: 1 [IU] via SUBCUTANEOUS

## 2019-12-16 MED ORDER — DOXERCALCIFEROL 4 MCG/2ML IV SOLN: 11.0000 ug | INTRAVENOUS | Status: DC

## 2019-12-16 MED ORDER — LORATADINE 10 MG PO TABS
10.0000 mg | ORAL_TABLET | Freq: Every day | ORAL | Status: DC
  Administered 2019-12-16 – 2019-12-18 (×3): 10 mg via ORAL
  Filled 2019-12-16 (×3): qty 1

## 2019-12-16 NOTE — H&P (Addendum)
[facility] Admission History and Physical Service Pager: [phone]  Patient name: Rachel Chaney Medical record number: [id number] Date of birth: February 21, 1960 Age: 59 y.o. Gender: female  Primary Care Provider: Benay Pike, MD Consultants: Nephro Code Status: Full   Preferred Emergency Contact: Steva Ready  Chief Complaint: weakness x 1 week  Assessment and Plan: Rachel Chaney is a 59 y.o. female presenting with weakness for 1 week . PMH is significant for ESRD on Dialysis Mondays, Wednesdays and Fridays, DM Type 2 on Insulin, HFpEF, HTN, H/O MI (2020), HLD  Volume overload Stable: Likely secondary to missed dialysis. On exam patient appears volume overloaded given 3 plus bilateral lower extremity pitting edema extending up to thighs.  Lung exam clear and no JVD appreciated.  Labs significant for Cr 11.4,BUN 57 Glucose 65, AG 16 and mild hyperchloremia. In the ED she was Hypoxia and was placed on 3 L oxygen.  Nephrology was called and will see today.   -Admit to Med Tele, Attending Dr Owens Shark -Nephro consulted and plan for dialysis today -Avoid nephrotoxic agents -Repeat BMP, CBC, Mag, Phos today -CBC, BMP in am -Daily weight -Intake and output -Incentive spirometry while awake -OOB with assistance daily -PT/OT evaluation -Ambulatory pulse ox when able  ESRD on dialysis Mon, Wed, Fri Dry weight 185 lbs.  Reports recent weight gain of 40lbs.  Denies any worsening SOB, chest pain, nausea/vomiting or abdominal pain.  Endorses making "few drops of urine".  Creatinine on admission 11.60.  No uremic symptoms.   -Nephro following and managing, appreciate recommendations  Leukocytosis Stable:  Afebrile. Chest xray findings suggestive of CHF and small left pleural effusion and cannot rule out pneumonitis. No open skin wounds appreciated on exam. Patient has history of persistent leukocytosis and was seen by Hematology in 2016.  Thought to be due to  ongoing chronic illness and diabetes.  Likely will need to follow up with hematology for further evaluation. -Monitor fever curve and will obtain blood cultures to ensure r/o bacteremia -Repeat CBC today -CBC in am -Consider heme consult outpatient  Anemia Stable: Likely secondary to ESRD Patient denies any obvious bleeding.  Labs significant for Hbg 8.9 -Repeat Hbg 7.6 -Transfuse 1 uPRBC -Repeat H&H s/p transfusion -Transfusion threshold <8 given H/O CAD -Consider colonoscopy outpatient  Hypoglycemia Resolved: Likely secondary to poor oral intake Serum glucose 68 in ED.  Likely due to not having eaten and had taken insulin prior to coming to ED.  Patient was given some food and sugars increase to 132.   -Continue to monitor for hypoglycemic events  HFpEF Stable: Last ECHO 02/20.    DM Type 2 Recent A1c 9.0 on 11/03.  Home medication Tresiba 50 u daily.  Reports compliancy with medication daily. -Hold home medication -start SSS insulin and monitor CBG's  HLD Stable: home medication Atorvastatin 80 mg -Continue home medication daily  FEN/GI:  -Renal/Carb modified Prophylaxis:  -Heparin 5000u [place]  Disposition: Admit to [facility].  Attending Dr Owens Shark  History of Present Illness:  Rachel Chaney is a 59 y.o. female presenting with 1 week history of weakness. She was seen by her PCP yesterday for same and was advised to go to ED for evaluation.  She currently denies any chest pain, SOB, nausea/vomiting, abdominal pain, diarrhea, constipation any source of bleeding.  Denies any recent sick contacts or fevers.   In the ED she was afebrile and vital signs were stable.  Chest xray consistent with CHF.  Review Of  Systems: Per HPI with the following additions:   Review of Systems  Constitutional: Negative for chills and fever.  Respiratory: Positive for cough. Negative for chest tightness, shortness of breath and wheezing.   Cardiovascular: Negative for chest pain and leg  swelling.  Gastrointestinal: Negative for abdominal pain, blood in stool, constipation, diarrhea, nausea and vomiting.  Neurological: Positive for weakness. Negative for dizziness and headaches.     Patient Active Problem List   Diagnosis Date Noted  . Hemorrhoids 11/25/2019  . Papular rash 11/25/2019  . Obstructive sleep apnea 11/12/2019  . Hypercapnic respiratory failure ([place]) 11/12/2019  . Seizure-like activity ([place])   . Community acquired bilateral lower lobe pneumonia 11/11/2019  . S/P transmetatarsal amputation of foot, right ([place]) 04/24/2018  . PAF (paroxysmal atrial fibrillation) ([place])   . Oxygen dependent   . Type 2 diabetes mellitus with diabetic neuropathy ([place])   . Severe protein-calorie malnutrition ([place])   . Seasonal allergies 04/12/2017  . Post-menopausal bleeding 12/07/2015  . ESRD (end stage renal disease) on dialysis ([place]) 05/25/2015  . Hypoalbuminemia   . Leukocytosis 05/17/2013  . Abnormal appearance of cervix 08/08/2011  . Back pain 08/07/2011  . GERD (gastroesophageal reflux disease) 04/27/2010  . Hyperlipidemia 03/08/2006  . Morbid obesity ([place]) 03/08/2006  . Essential hypertension with morbid obesity 03/08/2006    Past Medical History: Past Medical History:  Diagnosis Date  . Cardiac arrest ([place]) 02/28/2018   while on hemodialysis in hospital  . CHF (congestive heart failure) ([place])   . Chronic kidney disease due to diabetes mellitus ([place]) 10/13/2013  . Diabetes mellitus    Type II  . ESRD (end stage renal disease) ([place])    MWF - [facility]  . Hyperlipidemia   . Hypertension   . Neuropathy     Past Surgical History: Past Surgical History:  Procedure Laterality Date  . AMPUTATION Right 02/27/2018   Procedure: RIGHT FOOT 2ND AND 3RD RAY AMPUTATION;  Surgeon: [name], MD;  Location: [facility];  Service: Orthopedics;  Laterality: Right;  . AMPUTATION Right 04/24/2018   Procedure: RIGHT TRANSMETATARSAL AMPUTATION, PLACE VAC;  Surgeon:  [name], MD;  Location: [facility];  Service: Orthopedics;  Laterality: Right;  . AV FISTULA PLACEMENT Left 04/15/2015   Procedure: ARTERIOVENOUS (AV) FISTULA CREATION;  Surgeon: Mal Misty, MD;  Location: [facility];  Service: Vascular;  Laterality: Left;  . EYE SURGERY Right    retinal detachment  . FISTULA SUPERFICIALIZATION Left 06/03/2015   Procedure: LEFT UPPER ARM BRACHIOCEPHALIC FISTULA SUPERFICIALIZATION;  Surgeon: Mal Misty, MD;  Location: [facility];  Service: Vascular;  Laterality: Left;  from [place] to General  . STUMP REVISION Right 05/24/2018   Procedure: REVISION RIGHT TRANSMETATARSAL AMPUTATION WITH EXCISION BONE/SOFT TISSUE, LOCAL TISSUE  RE-ARRANGEMENT  AND VAC PLACEMENT;  Surgeon: [name], MD;  Location: [facility];  Service: Orthopedics;  Laterality: Right;    Social History: Social History   Tobacco Use  . Smoking status: Never Smoker  . Smokeless tobacco: Never Used  Vaping Use  . Vaping Use: Never used  Substance Use Topics  . Alcohol use: No    Alcohol/week: 0.0 standard drinks  . Drug use: No   Additional social history: None Please also refer to relevant sections of EMR.  Family History: Family History  Problem Relation Age of Onset  . Heart disease Mother   . Heart disease Father      Allergies and Medications: No Known Allergies No current facility-administered  medications on file prior to encounter.   Current Outpatient Medications on File Prior to Encounter  Medication Sig Dispense Refill  . acetaminophen (TYLENOL) 500 MG tablet Take 1,000 mg by mouth every 6 (six) hours as needed for pain.    Marland Kitchen aspirin EC 81 MG tablet Take 1 tablet (81 mg total) by mouth daily. (Patient not taking: Reported on 11/12/2019) 90 tablet 3  . atorvastatin (LIPITOR) 80 MG tablet TAKE 1 TABLET(80 MG) BY MOUTH AT BEDTIME (Patient taking differently: Take 80 mg by mouth at bedtime. ) 90 tablet 3  . Blood Glucose Monitoring Suppl (ONE TOUCH ULTRA 2) w/Device KIT 1 kit by  Does not apply route 3 (three) times daily. ICD-10 code: E11.40 1 each 0  . cadexomer iodine (IODOSORB) 0.9 % gel Apply to left 5th digit wounds once daily 40 g 0  . cetirizine (ZYRTEC) 10 MG tablet Take 10 mg by mouth as needed for allergies.     . cyclobenzaprine (FLEXERIL) 10 MG tablet TAKE 1 TABLET(10 MG) BY MOUTH THREE TIMES DAILY BETWEEN MEALS AND AT BEDTIME AS NEEDED FOR MUSCLE SPASMS 30 tablet 0  . Etelcalcetide HCl (PARSABIV IV) Inject into the vein.     . ferric citrate (AURYXIA) 1 GM 210 MG(Fe) tablet Take 2 tablets (420 mg total) by mouth 3 (three) times daily with meals. Give two (420 mg) by mouth three times a day wih meals for ESRD (Patient taking differently: Take 210-420 mg by mouth See admin instructions. Take 420 mg with each meal and 210 mg with snacks) 180 tablet 0  . fluticasone (FLONASE) 50 MCG/ACT nasal spray Place 1 spray into both nostrils daily. 16 g 5  . glucose blood (ONE TOUCH ULTRA TEST) test strip 1 each by Other route 3 (three) times daily. ICD-10 code: E11.40. 100 each 0  . hydrocortisone 2.5 % ointment Apply topically 2 (two) times daily. As needed for mild eczema.  Do not use for more than 1-2 weeks at a time. 30 g 3  . hydrocortisone-pramoxine (PRAMOSONE) 1-2.5 % LOTN Apply to affected area twice a day as needed 118 mL 1  . hydrOXYzine (ATARAX/VISTARIL) 25 MG tablet Take 25 mg by mouth 2 (two) times daily as needed for anxiety.     . insulin degludec (TRESIBA FLEXTOUCH) 100 UNIT/ML FlexTouch Pen Inject 50 Units into the skin in the morning. 27 mL 0  . Insulin Pen Needle (B-D UF III MINI PEN NEEDLES) 31G X 5 MM MISC USE AS DIRECTED TWICE DAILY 200 each 0  . Insulin Pen Needle 29G X 12MM MISC Inject 1 pen into the skin 2 (two) times daily. Check blood sugar daily. 100 each 0  . Insulin Syringe-Needle U-100 (ULTICARE INSULIN SYRINGE) 30G X 5/16" 1 ML MISC USE AS DIRECTED 100 each 0  . Methoxy PEG-Epoetin Beta (MIRCERA IJ) Mircera    . multivitamin (RENA-VIT) TABS  tablet Take 1 tablet by mouth daily.    . nitroGLYCERIN (NITRODUR - DOSED IN MG/24 HR) 0.2 mg/hr patch Place 1 patch (0.2 mg total) onto the skin daily. (Patient not taking: Reported on 11/12/2019) 30 patch 12  . OneTouch Delica Lancets 19F MISC 1 Device by Does not apply route as needed (to check blood glucose). 100 each 0  . pentoxifylline (TRENTAL) 400 MG CR tablet Take 1 tablet (400 mg total) by mouth 3 (three) times daily with meals. (Patient not taking: Reported on 11/12/2019) 90 tablet 1  . [DISCONTINUED] progesterone (PROMETRIUM) 200 MG capsule Take 1 capsule (  200 mg total) by mouth daily. Take for 10 days if bleeding occurs for more than 4 days. 10 capsule 1    Objective: BP 130/60   Pulse 83   Temp 97.9 F (36.6 C) (Oral)   Resp 18   LMP 09/18/2011   SpO2 98%    Exam: General: Alert and oriented, no apparent distress  Eyes: PEERLA ENTM: No pharyengeal erythema, mucus membranes dry Neck: nontender, no JVD appreciated Cardiovascular: RRR with harsh systolic ejection murmur, 3 plus bilateral pitting edema extending upward to thighs.  Distal pulses present.  Left upper arm AV fistula.  Good thrill and bruit present. Respiratory: CTA bilaterally, diminished at the bases given body habitus  Gastrointestinal: Anasarca and obese. No abdominal pain. BS present MSK: Upper extremity strength 5/5 bilaterally, Lower extremity strength 5/5 bilaterally  Derm: No rashes noted   Labs and Imaging: CBC BMET  Recent Labs  Lab 12/15/19 1237  WBC 25.7*  HGB 8.9*  HCT 30.4*  PLT 339   Recent Labs  Lab 12/15/19 1237  NA 138  K 4.4  CL 96*  CO2 26  BUN 57*  CREATININE 11.40*  GLUCOSE 65*  CALCIUM 9.8     EKG: My own interpretation NSR with ST elevation  DG Chest 2 View  Result Date: 12/16/2019 CLINICAL DATA:  Fatigue.  Missed dialysis. EXAM: CHEST - 2 VIEW COMPARISON:  11/14/2019. FINDINGS: Cardiomegaly with mild bilateral interstitial prominence. Small left pleural effusion  cannot be excluded. Findings suggest CHF. Pneumonitis cannot be excluded. Surgical clips right chest wall. No acute bony abnormality. IMPRESSION: Cardiomegaly with mild bilateral interstitial prominence and small left pleural effusion. Findings suggest CHF. Pneumonitis cannot be excluded. Electronically Signed   By: Marcello Moores  Register   On: 12/16/2019 05:21    Carollee Leitz, MD 12/16/2019, 7:17 AM PGY-2, [place] Intern pager: [phone], text pages welcome

## 2019-12-16 NOTE — ED Notes (Signed)
Unable to obtain 2nd set of blood cultures. 

## 2019-12-16 NOTE — ED Notes (Signed)
Gave report to dialysis 

## 2019-12-16 NOTE — Hospital Course (Addendum)
Rachel Chaney is a 59 y.o. female that presented with fluid overload 2/2 missed dialysis. PMH significant for ESRD on HD M/W/F, T2DM, HFpEF, HTN, h/o MI (2020), HDL   Acute hypoxic respiratory failure, likely secondary to missed dialysis, resolved Patient was admitted with weakness for 1 week after missing hemodialysis. Physical exam significant for 3+ lower extremity pitting edema. Patient was placed on 3L of O2 in the ED. Labs: Cr of 11.4 and BUN 57. Patient's symptoms improved with 2 hemodialysis sessions before discharge.   ESRD on hemodialysis, stable Patient reported recent weight gain on 40lbs. Creatinine on admission 11.60 and BUN of 57. Patient received hemodialysis after admission and was restarted back on scheduled dialysis M/W/F.   Hypoglycemia in the setting of T2DM, resolved Patient was admitted with CBG 65 and an anion gap of 16. Patient reportedly took insulin and did not eat prior to admission. Resolved with increased oral intake. Home medications were initially held and patient on sliding scale insulin while hospitalized. Home Lantus was resumed before discharge.    Leukocytosis Patient was afebrile, CXR showed small left pleural effusion. Blood cultures were collected to rule out bacteremia with no growth to date upon discharge. Antibiotics were not initiated as there was low suspicion for infection. Patient has a history of persistent leukocytosis and last seen by hematology in 2016, likely due to ongoing chronic illness and diabetes.   Acute on chronic macrocytic anemia Patient has unclear etiology of acute anemia in the setting of chronic anemia. Patient Hb on admission 7.5 and transfused 1U PRBC. Hb 8.7 on discharge. Reticulocytes were elevated on further labs. Will need hematology and GI follow-up.  All other chronic conditions were stable and addressed with home medications as appropriate.   PCP recommendations Pt has persistent leukocytosis,consider referral to  Hematology as outpatient.  OP colonoscopy for investigation of anemia Follow-up with diabetes education and further monitoring of blood sugar levels.

## 2019-12-16 NOTE — Progress Notes (Addendum)
FMTS Attending Daily Note: Rachel Singh, MD  Team Pager [phone] Pager [phone]  I have seen and examined this patient, reviewed their chart. I have discussed this patient with the resident. I agree with the resident's findings, assessment and care plan.  Acute hypoxemic respiratory failure patient has chronic component of respiratory failure but rarely requires oxygen at home. Appreciate Nephrology management of volume overload.  Acute on chronic anemia (macrocytic), unclear etiology, reticulocytes elevated, will need hematology and GI follow up. No bleeding today, repeat tomorrow AM. If evidence of hemodynamic changes or GI bleeding, will ask GI to evaluate.   Likely discharge 12/9 if hemoglobin stable.    Status is: Inpatient  Remains inpatient appropriate because:Inpatient level of care appropriate due to severity of illness   Dispo:  Patient From: Home  Planned Disposition: Home  Expected discharge date: 12/18/19  Medically stable for discharge: No         Family Medicine Teaching Service Daily Progress Note Intern Pager: [phone]  Patient name: Rachel Chaney Medical record number: [id number] Date of birth: Jul 04, 1960 Age: 59 y.o. Gender: female  Primary Care Provider: Benay Pike, MD Consultants: Nephrology Code Status: Full  Pt Overview and Major Events to Date:  12/7 - Admitted  Assessment and Plan: Rachel Chaney is a 59 y.o. female that presented with weakness for 1 week. PMH significant for ESRD on HD M/W/F, T2DM, HFpEF, HTN, h/o MI (2020), HDL   Acute hypoxic respiratory failure, likely 2/2 missed dialysis  ESRD on HD M/W/F Creatinine 12.56>7.70, GFR 6. Patient underwent HD on 12/8, plan for dialysis today as well - Nephrology following, appreciate recs - Continue hemodialysis schedule - Ensure BiPAP at night  Leukocytosis Chronic leukocytosis.  CXR does not demonstrate concern for pneumonia.  -Follow-up blood cultures -Will need  follow-up with hematology  Macrocytic anemia, chronic On admission, Hgb 7.5, patient transfused 1U due to threshold of 8, Hgb now 9.8>8.6; baseline appears to be around 10. Ferritin 670 (977 on 11/3). Absolute retic count wnl, percent retics elevated at 6.6 (4.8 on 11/3). Folate, LDH and B12 wnl. Labs complicated by patient receiving transfusion, will need re-evaluation outpatient. Consider myelodysplastic disease or other hematologic abnormality. Patient denies active vaginal and rectal bleeding.  - F/u haptoglobin - Continue to monitor with CBCs  Hyperglycemia Patient initially presented with blood sugar of 251, currently blood sugar is 222.  - Resume home Lantus   FEN/GI: Renal/carb modified PPx: Heparin   Status is: Inpatient  Remains inpatient appropriate because:Ongoing diagnostic testing needed not appropriate for outpatient work up and IV treatments appropriate due to intensity of illness or inability to take PO   Dispo: The patient is from: Home              Anticipated d/c is to: Home              Anticipated d/c date is: 1 day              Patient currently is not medically stable to d/c.     Subjective:  Patient has no complaints today, she reports that she is feeling much better than when she originally came in.    Objective: Temp:  [97.8 F (36.6 C)-98.5 F (36.9 C)] 98 F (36.7 C) (12/07 1713) Pulse Rate:  [75-95] 83 (12/07 1713) Resp:  [13-23] 16 (12/07 1713) BP: (106-148)/(38-84) 116/38 (12/07 1713) SpO2:  [67 %-100 %] 100 % (12/07 1713) Physical Exam: General: NAD, obese Cardiovascular: RRR, harsh systolic  ejection murmur, 1+ pitting edema right lower extremity Respiratory: CTA bilaterally, normal WOB Abdomen: Soft, nontender, nondistended, bowel sounds present  Laboratory: Recent Labs  Lab 12/15/19 1237 12/16/19 1115  WBC 25.7* 27.3*  HGB 8.9* 7.5*  HCT 30.4* 24.4*  PLT 339 342   Recent Labs  Lab 12/15/19 1237 12/16/19 1115  NA 138 138   K 4.4 5.0  CL 96* 93*  CO2 26 24  BUN 57* 67*  CREATININE 11.40* 12.56*  CALCIUM 9.8 9.2  GLUCOSE 65* 251*      Imaging/Diagnostic Tests: No results found.   Rise Patience, DO 12/16/2019, 6:38 PM PGY-1, [place] Intern pager: [phone], text pages welcome

## 2019-12-16 NOTE — Consult Note (Addendum)
[facility] Renal Consultation Note  Indication for Consultation:  Management of ESRD/hemodialysis; anemia, hypertension/volume and secondary hyperparathyroidism  HPI: Rachel Chaney is a 59 y.o. 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   Patient admitted with weakness x1 week.  She missed last 2 outpatient hemodialysis treatments and noted has not completed a full treatment last 3 treatments signing off early.  She denies shortness of breath chest pain nausea vomiting abdominal pain fever chills.  Seen in the ER she says she has a weak feeling.  Test x-ray showing cardiomegaly, bilateral interstitial prominence small pleural effusions findings consistent with CHF, cannot rule out pneumonitis glucose initially 68 in ED improved to 132 after food and sugar K 3.9, BUN 35, creatinine 9.32, WBC 16.1, Hgb 10.9. Consulted for hemodialysis and ESRD support.      Past Medical History:  Diagnosis Date  . Cardiac arrest ([place]) 02/28/2018   while on hemodialysis in hospital  . CHF (congestive heart failure) ([place])   . Chronic kidney disease due to diabetes mellitus ([place]) 10/13/2013  . Diabetes mellitus    Type II  . ESRD (end stage renal disease) ([place])    MWF - [facility]  . Hyperlipidemia   . Hypertension   . Neuropathy     Past Surgical History:  Procedure Laterality Date  . AMPUTATION Right 02/27/2018   Procedure: RIGHT FOOT 2ND AND 3RD RAY AMPUTATION;  Surgeon: [name], MD;  Location: [place];  Service: Orthopedics;  Laterality: Right;  . AMPUTATION Right 04/24/2018   Procedure: RIGHT TRANSMETATARSAL AMPUTATION, PLACE VAC;  Surgeon: [name], MD;  Location: [place];  Service: Orthopedics;  Laterality: Right;  . AV FISTULA PLACEMENT Left 04/15/2015   Procedure: ARTERIOVENOUS (AV) FISTULA CREATION;  Surgeon: Mal Misty, MD;  Location: [place];  Service: Vascular;  Laterality: Left;  . EYE SURGERY Right    retinal detachment  . FISTULA SUPERFICIALIZATION Left 06/03/2015   Procedure: LEFT UPPER ARM BRACHIOCEPHALIC FISTULA SUPERFICIALIZATION;  Surgeon: Mal Misty, MD;  Location: [place];  Service: Vascular;  Laterality: Left;  from [place] to General  . STUMP REVISION Right 05/24/2018   Procedure: REVISION RIGHT TRANSMETATARSAL AMPUTATION WITH EXCISION BONE/SOFT TISSUE, LOCAL TISSUE  RE-ARRANGEMENT  AND VAC PLACEMENT;  Surgeon: [name], MD;  Location: [place];  Service: Orthopedics;  Laterality: Right;      Family History  Problem Relation Age of Onset  . Heart disease Mother   . Heart disease Father       reports that she has never smoked. She has never used smokeless tobacco. She reports that she does not drink alcohol and does not use drugs.  No Known Allergies  Prior to Admission medications   Medication Sig Start Date End Date Taking? Authorizing Provider  acetaminophen (TYLENOL) 500 MG tablet Take 1,000 mg by mouth every 6 (six) hours as needed for pain.    [provider]  aspirin EC 81 MG tablet Take 1 tablet (81 mg total) by mouth daily. Patient not taking: Reported on 11/12/2019 04/30/18   Erlene Quan, PA-C  atorvastatin (LIPITOR) 80 MG tablet TAKE 1 TABLET(80  MG) BY MOUTH AT BEDTIME Patient taking differently: Take 80 mg by mouth at bedtime.  11/04/19   Benay Pike, MD  Blood Glucose Monitoring Suppl (ONE TOUCH ULTRA 2) w/Device KIT 1 kit by Does not apply route 3 (three) times daily. ICD-10 code: E11.40 03/26/18   Wille Celeste, PA-C  cadexomer iodine (IODOSORB) 0.9 % gel Apply to left 5th digit wounds once daily 07/22/19   Marzetta Board, DPM  cetirizine (ZYRTEC) 10 MG tablet Take 10 mg by mouth  as needed for allergies.  04/26/18   [provider]  cyclobenzaprine (FLEXERIL) 10 MG tablet TAKE 1 TABLET(10 MG) BY MOUTH THREE TIMES DAILY BETWEEN MEALS AND AT BEDTIME AS NEEDED FOR MUSCLE SPASMS 12/09/19   Benay Pike, MD  Etelcalcetide HCl (PARSABIV IV) Inject into the vein.  02/10/19 02/09/20  [provider]  ferric citrate (AURYXIA) 1 GM 210 MG(Fe) tablet Take 2 tablets (420 mg total) by mouth 3 (three) times daily with meals. Give two (420 mg) by mouth three times a day wih meals for ESRD Patient taking differently: Take 210-420 mg by mouth See admin instructions. Take 420 mg with each meal and 210 mg with snacks 03/26/18   Lassen, Arlo C, PA-C  fluticasone (FLONASE) 50 MCG/ACT nasal spray Place 1 spray into both nostrils daily. 06/16/19   Benay Pike, MD  glucose blood (ONE TOUCH ULTRA TEST) test strip 1 each by Other route 3 (three) times daily. ICD-10 code: E11.40. 03/26/18   Wille Celeste, PA-C  hydrocortisone 2.5 % ointment Apply topically 2 (two) times daily. As needed for mild eczema.  Do not use for more than 1-2 weeks at a time. 11/25/19   Benay Pike, MD  hydrocortisone-pramoxine (PRAMOSONE) 1-2.5 % LOTN Apply to affected area twice a day as needed 12/11/19   Benay Pike, MD  hydrOXYzine (ATARAX/VISTARIL) 25 MG tablet Take 25 mg by mouth 2 (two) times daily as needed for anxiety.  10/01/19   [provider]  insulin degludec (TRESIBA FLEXTOUCH) 100 UNIT/ML FlexTouch Pen Inject 50 Units into the skin in the morning. 11/19/19   Zenia Resides, MD  Insulin Pen Needle (B-D UF III MINI PEN NEEDLES) 31G X 5 MM MISC USE AS DIRECTED TWICE DAILY 04/30/19   Benay Pike, MD  Insulin Pen Needle 29G X 12MM MISC Inject 1 pen into the skin 2 (two) times daily. Check blood sugar daily. 03/26/18   [place]anville Lewis C, PA-C  Insulin Syringe-Needle U-100 Flossie Buffy INSULIN SYRINGE) 30G X 5/16" 1 ML MISC USE AS DIRECTED 03/26/18   [place] Lewis C, PA-C  Methoxy PEG-Epoetin  Beta (MIRCERA IJ) Mircera 01/31/19 01/30/20  [provider]  multivitamin (RENA-VIT) TABS tablet Take 1 tablet by mouth daily.    [provider]  nitroGLYCERIN (NITRODUR - DOSED IN MG/24 HR) 0.2 mg/hr patch Place 1 patch (0.2 mg total) onto the skin daily. Patient not taking: Reported on 11/12/2019 05/23/18   Rayburn, Neta Mends, PA-C  OneTouch Delica Lancets 95K MISC 1 Device by Does not apply route as needed (to check blood glucose). 03/26/18   Granville Lewis C, PA-C  pentoxifylline (TRENTAL) 400 MG CR tablet Take 1 tablet (400 mg total) by mouth 3 (three) times daily with meals. Patient not taking: Reported on 11/12/2019 05/23/18   Rayburn, Neta Mends, PA-C  progesterone (PROMETRIUM) 200 MG capsule Take 1 capsule (200 mg total) by mouth daily. Take for 10 days if bleeding occurs for more than  4 days. 11/09/10 03/28/11  Lyndal Pulley, DO     Anti-infectives (From admission, onward)   None      Results for orders placed or performed during the hospital encounter of 12/15/19 (from the past 48 hour(s))  CBG monitoring, ED     Status: Abnormal   Collection Time: 12/15/19 12:18 PM  Result Value Ref Range   Glucose-Capillary 69 (L) 70 - 99 mg/dL    Comment: Glucose reference range applies only to samples taken after fasting for at least 8 hours.  Basic metabolic panel     Status: Abnormal   Collection Time: 12/15/19 12:37 PM  Result Value Ref Range   Sodium 138 135 - 145 mmol/L   Potassium 4.4 3.5 - 5.1 mmol/L   Chloride 96 (L) 98 - 111 mmol/L   CO2 26 22 - 32 mmol/L   Glucose, Bld 65 (L) 70 - 99 mg/dL    Comment: Glucose reference range applies only to samples taken after fasting for at least 8 hours.   BUN 57 (H) 8 - 23 mg/dL   Creatinine, Ser 11.40 (H) 0.44 - 1.00 mg/dL   Calcium 9.8 8.9 - 10.3 mg/dL   GFR, Estimated 3 (L) >60 mL/min    Comment: (NOTE) Calculated using the CKD-EPI Creatinine Equation (2021)    Anion gap 16 (H) 5 - 15    Comment: Performed  at [facility] [address] [place], [place] [address]  CBC     Status: Abnormal   Collection Time: 12/15/19 12:37 PM  Result Value Ref Range   WBC 25.7 (H) 4.0 - 10.5 K/uL   RBC 2.75 (L) 3.87 - 5.11 MIL/uL   Hemoglobin 8.9 (L) 12.0 - 15.0 g/dL   HCT 30.4 (L) 36 - 46 %   MCV 110.5 (H) 80.0 - 100.0 fL   MCH 32.4 26.0 - 34.0 pg   MCHC 29.3 (L) 30.0 - 36.0 g/dL   RDW 18.6 (H) 11.5 - 15.5 %   Platelets 339 150 - 400 K/uL   nRBC 0.2 0.0 - 0.2 %    Comment: Performed at [facility] [address], [place],  [address]  POC CBG, ED     Status: Abnormal   Collection Time: 12/16/19  4:56 AM  Result Value Ref Range   Glucose-Capillary 66 (L) 70 - 99 mg/dL    Comment: Glucose reference range applies only to samples taken after fasting for at least 8 hours.  Resp Panel by RT-PCR (Flu A&B, Covid) Nasopharyngeal Swab     Status: None   Collection Time: 12/16/19  5:24 AM   Specimen: Nasopharyngeal Swab; Nasopharyngeal(NP) swabs in vial transport medium  Result Value Ref Range   SARS Coronavirus 2 by RT PCR NEGATIVE NEGATIVE    Comment: (NOTE) SARS-CoV-2 target nucleic acids are NOT DETECTED.  The SARS-CoV-2 RNA is generally detectable in upper respiratory specimens during the acute phase of infection. The lowest concentration of SARS-CoV-2 viral copies this assay can detect is 138 copies/mL. A negative result does not preclude SARS-Cov-2 infection and should not be used as the sole basis for treatment or other patient management decisions. A negative result may occur with  improper specimen collection/handling, submission of specimen other than nasopharyngeal swab, presence of viral mutation(s) within the areas targeted by this assay, and inadequate number of viral copies(<138 copies/mL). A negative result must be combined with clinical observations, patient history, and epidemiological information. The expected result is Negative.  Fact Sheet  for Patients:   EntrepreneurPulse.com.au  Fact Sheet for Healthcare Providers:  IncredibleEmployment.be  This test is no t yet approved or cleared by the [place] FDA and  has been authorized for detection and/or diagnosis of SARS-CoV-2 by FDA under an Emergency Use Authorization (EUA). This EUA will remain  in effect (meaning this test can be used) for the duration of the COVID-19 declaration under Section 564(b)(1) of the Act, 21 U.S.C.section 360bbb-3(b)(1), unless the authorization is terminated  or revoked sooner.       Influenza A by PCR NEGATIVE NEGATIVE   Influenza B by PCR NEGATIVE NEGATIVE    Comment: (NOTE) The Xpert Xpress SARS-CoV-2/FLU/RSV plus assay is intended as an aid in the diagnosis of influenza from Nasopharyngeal swab specimens and should not be used as a sole basis for treatment. Nasal washings and aspirates are unacceptable for Xpert Xpress SARS-CoV-2/FLU/RSV testing.  Fact Sheet for Patients: EntrepreneurPulse.com.au  Fact Sheet for Healthcare Providers: IncredibleEmployment.be  This test is not yet approved or cleared by the [place] FDA and has been authorized for detection and/or diagnosis of SARS-CoV-2 by FDA under an Emergency Use Authorization (EUA). This EUA will remain in effect (meaning this test can be used) for the duration of the COVID-19 declaration under Section 564(b)(1) of the Act, 21 U.S.C. section 360bbb-3(b)(1), unless the authorization is terminated or revoked.  Performed at [facility], [place] [address] [place], [place] [address]   POC CBG, ED     Status: None   Collection Time: 12/16/19  5:47 AM  Result Value Ref Range   Glucose-Capillary 89 70 - 99 mg/dL    Comment: Glucose reference range applies only to samples taken after fasting for at least 8 hours.  POC CBG, ED     Status: Abnormal   Collection Time: 12/16/19  6:47 AM  Result Value Ref Range    Glucose-Capillary 132 (H) 70 - 99 mg/dL    Comment: Glucose reference range applies only to samples taken after fasting for at least 8 hours.    ROS: see hpi   Physical Exam: Vitals:   12/16/19 0450 12/16/19 0615  BP: (!) 146/60 130/60  Pulse: 81 83  Resp: 19 18  Temp: 97.9 F (36.6 C)   SpO2: 95% 98%     General: In the ER obese well-built well-nourished female appears chronically ill NAD HEENT: [place], PERRLA, EOMI, nonicteric, mucous membranes moist Neck: Supple, no JVD Heart: RRR, no MRG Lungs: CTA no nonlabored breathing on 2 L nasal cannula oxygen Abdomen: Bowel sounds normoactive, obese soft nontender nondistended , has some abdominal wall edema noted Extremities: Bipedal edema to the thighs 1+, right transmit  amputation site clean dry Skin: No overt rash warm dry, Neuro: Alert oriented x3.  No acute focal deficits appreciated Dialysis Access: Positive bruit left upper arm AVF  Dialysis Orders: Center: [facility] on MWF, 3.5 hours, EDW 98.5 kg, Bath two 2K, 2 calcium, UF profile 4, Sensipar 30 q. hemodialysis, Hectorol 11 MCG given q. dialysis, heparin 8000 units q. dialysis, accesses left upper arm AV fistula, no Mircera or iron given  Assessment/Plan 1. Volume overload= secondary to missed hemodialysis x2 with anasarca appearance, hemodialysis today for 3 hours again tomorrow for 3 hours to get back on schedule UF as able 2. ESRD -HD schedule MWF hemodialysis as above 3. Weakness= probable multifocal clean uremia missed dialysis x2 work-up per admit team 4. Hypertension/volume  -volume overload secondary to 2 dialysis treatments missed plan hemotoday and tomorrow  UF as tolerated 5. Anemia  -Hgb 7.5 we will plan to give Aranesp could be dilutional, no reported bleeding /no outpatient Mircera since October 21, 2029 6. Metabolic bone disease -Hectorol IV on dialysis, hold vitamin D 2/2 elevated calcium ,Sensipar on dialysis binder with meals noted phosphorus 9.4 reflecting her  noncompliance with HD and binders 7. Nutrition -renal carb modified Albumin 2.7 give protein supplement 8. Diabetes mellitus type 2 per admit  Ernest Haber, PA-C [place] [phone] 12/16/2019, 9:23 AM   Nephrology attending: Patient was seen and examined.  Chart reviewed.  I agree with assessment and plan as outlined above. ESRD on HD admitted with fluid overload and generalized weakness.  Plan for dialysis today and tomorrow.  Resume home medication.  To ESA for anemia.  Chronic noncompliance with dialysis and binders.  Katheran James, MD [facility].

## 2019-12-16 NOTE — Progress Notes (Addendum)
FMTS Attending Brief Note: Rachel Singh, MD  Personal pager:  [phone] [place] Service Pager:  [phone]   I  have seen and examined this patient, reviewed their chart. I have discussed this patient with the resident.Will sign resident note as available.   59 year old woman with history of end-stage renal disease on hemodialysis, type 2 diabetes, neuropathy, right transmetatarsal amputation and persistent leukocytosis presenting with hypervolemia and symptoms of uremia.  The patient reports that she missed 2 days of dialysis in the last week.  On Wednesday dialysis she felt started feeling unwell.  She has had an ongoing chronic cough.  This is worsened to some degree.  She specifically denies headaches, chest pains, difficulty breathing, abdominal pain, nausea, vomiting, melena or hematochezia.  She reports she is not sure why she missed dialysis but did not wish to attend because she felt unwell and then felt worse after not attending.   Medical history is notable for chronic kidney disease on hemodialysis, diabetes on insulin therapy neuropathy and suspected vascular disease with a notable right transmetatarsal amputation. The patient was recently admitted for altered mental status thought to be due to hypercarbia in the setting of undiagnosed OSA. She also had ongoing anemia. She had a endometrial biopsy which was benign. She has never had a colonoscopy Last echocardiogram showed diastolic dysfucntion, RV function reduced   Surgical history reviewed--R transmetarsal amputation  Family history- + heart disease Socially- non smoker   Labs--notable for hyperglycemia initially, this is now improved, Metabolic panel notable for anion gap acidosis with hyperglycemia, elevated creatinine, white blood cell count is now 27,000 which is mostly neutrophilic in nature with also elevated eosinophils.  She has a profoundly macrocytic anemia.  EKG-sinus rhythm with early transition Imaging-chest x-ray  shows some pulmonary edema Patient seen at 2 PM in dialysis.  She is doing well talkative and appropriate.  She reports an ongoing cough and that is her only bothersome symptom.  She reports overall she actually feels improved from presentation. Cardiac: Regular rate and rhythm.  Systolic murmur at left upper sternal border.  This is a 3 out of 6 Lungs: Coarse bilateral breath sounds Abdomen: Normoactive bowel sounds. No tenderness to deep or light palpation. No rebound or guarding.   Psych: Pleasant and appropriate  Right lower extremity with mild edema left with minimal right transmetatarsal amputation healing no ulcers   1. Acute hypoxic respiratory failure as document in the ER she is on nasal cannula in the dialysis unit.  She reports her breathing is improving.  I suspect this is due to missed dialysis.  Appreciate nephrology input and care. 2. Leukocytosis, this has been chronic and ongoing.  Given her symptoms and concern for infectious symptoms will evaluate for infection.  Chest x-ray does not demonstrate concern for pneumonia, could consider sputum cultures pending her sputum production.  Blood cultures ordered and will follow up.  We will hold off on antibiotics for now as this is a chronic problem.  She will need follow-up with hematology for this. 3. Macrocytic anemia, uncertain cause in part likely due to chronic disease but will check folate, vitamin B12 reticulocytes as well as haptoglobin and LDH.  She could have a hemolytic process ongoing and in conjunction with her leukocytosis could in indicate a myelodysplastic disease or other primary hematologic abnormality.  The patient specifically denies vaginal bleeding, melena or hematochezia my exam.  Will be important regardless it that she have an outpatient.  Transfuse x1 unit and post transfusion H&H.  She does not have an appropriate response we will evaluate for bleeding and consider GI consultation while inpatient.   . 4. Hypoglycemia, suspect she has difficult to manage type 2 diabetes.  We will be cautious and judicious with insulin use.  Likely restart long-acting insulin on December 8. 5. Chronic kidney disease stage V on hemodialysis, appreciate nephrology input and care. Will sign resident note as available.

## 2019-12-16 NOTE — Plan of Care (Signed)
  Problem: Education: Goal: Knowledge of General Education information will improve Description: Including pain rating scale, medication(s)/side effects and non-pharmacologic comfort measures Outcome: Progressing   Problem: Health Behavior/Discharge Planning: Goal: Ability to manage health-related needs will improve Outcome: Progressing   Problem: Pain Managment: Goal: General experience of comfort will improve Outcome: Progressing   

## 2019-12-16 NOTE — ED Provider Notes (Signed)
[facility] EMERGENCY DEPARTMENT Provider Note  CSN: [id number] Arrival date & time: 12/15/19 1144  Chief Complaint(s) Fatigue  HPI Rachel Chaney is a 59 y.o. female with a past medical history listed below including ESRD on dialysis MWF, diabetes who presents to the emergency department for generalized fatigue since dialysis on Wednesday.  Reports that she missed dialysis on Friday and Monday.  Opted to see her primary care provider yesterday, Monday.  Patient was sent from the family medicine clinic for her generalized fatigue.  They noted that the patient was 40 pounds over her dry weight.  She was also noted to be hypoxic with sats in the 70%.   Patient reports mild cough.  No fevers or chills.  No chest pain.  She is endorsing leg swelling.  No abdominal pain, nausea,vomiting.    HPI  Past Medical History Past Medical History:  Diagnosis Date  . Cardiac arrest ([place]) 02/28/2018   while on hemodialysis in hospital  . CHF (congestive heart failure) ([place])   . Chronic kidney disease due to diabetes mellitus ([place]) 10/13/2013  . Diabetes mellitus    Type II  . ESRD (end stage renal disease) ([place])    MWF - [facility]  . Hyperlipidemia   . Hypertension   . Neuropathy    Patient Active Problem List   Diagnosis Date Noted  . Hemorrhoids 11/25/2019  . Papular rash 11/25/2019  . Obstructive sleep apnea 11/12/2019  . Hypercapnic respiratory failure ([place]) 11/12/2019  . Seizure-like activity ([place])   . Community acquired bilateral lower lobe pneumonia 11/11/2019  . S/P transmetatarsal amputation of foot, right ([place]) 04/24/2018  . PAF (paroxysmal atrial fibrillation) ([place])   . Oxygen dependent   . Type 2 diabetes mellitus with diabetic neuropathy ([place])   . Severe protein-calorie malnutrition ([place])   . Seasonal allergies 04/12/2017  . Post-menopausal bleeding 12/07/2015  . ESRD (end stage renal disease) on dialysis ([place]) 05/25/2015  . Hypoalbuminemia   .  Leukocytosis 05/17/2013  . Abnormal appearance of cervix 08/08/2011  . Back pain 08/07/2011  . GERD (gastroesophageal reflux disease) 04/27/2010  . Hyperlipidemia 03/08/2006  . Morbid obesity ([place]) 03/08/2006  . Essential hypertension with morbid obesity 03/08/2006   Home Medication(s) Prior to Admission medications   Medication Sig Start Date End Date Taking? Authorizing Provider  acetaminophen (TYLENOL) 500 MG tablet Take 1,000 mg by mouth every 6 (six) hours as needed for pain.    [provider]  aspirin EC 81 MG tablet Take 1 tablet (81 mg total) by mouth daily. Patient not taking: Reported on 11/12/2019 04/30/18   Erlene Quan, PA-C  atorvastatin (LIPITOR) 80 MG tablet TAKE 1 TABLET(80 MG) BY MOUTH AT BEDTIME Patient taking differently: Take 80 mg by mouth at bedtime.  11/04/19   Benay Pike, MD  Blood Glucose Monitoring Suppl (ONE TOUCH ULTRA 2) w/Device KIT 1 kit by Does not apply route 3 (three) times daily. ICD-10 code: E11.40 03/26/18   Wille Celeste, PA-C  cadexomer iodine (IODOSORB) 0.9 % gel Apply to left 5th digit wounds once daily 07/22/19   Marzetta Board, DPM  cetirizine (ZYRTEC) 10 MG tablet Take 10 mg by mouth as needed for allergies.  04/26/18   [provider]  cyclobenzaprine (FLEXERIL) 10 MG tablet TAKE 1 TABLET(10 MG) BY MOUTH THREE TIMES DAILY BETWEEN MEALS AND AT BEDTIME AS NEEDED FOR MUSCLE SPASMS 12/09/19   Benay Pike, MD  Etelcalcetide HCl (PARSABIV IV) Inject into  the vein.  02/10/19 02/09/20  [provider]  ferric citrate (AURYXIA) 1 GM 210 MG(Fe) tablet Take 2 tablets (420 mg total) by mouth 3 (three) times daily with meals. Give two (420 mg) by mouth three times a day wih meals for ESRD Patient taking differently: Take 210-420 mg by mouth See admin instructions. Take 420 mg with each meal and 210 mg with snacks 03/26/18   Lassen, Arlo C, PA-C  fluticasone (FLONASE) 50 MCG/ACT nasal spray Place 1 spray into both nostrils  daily. 06/16/19   Benay Pike, MD  glucose blood (ONE TOUCH ULTRA TEST) test strip 1 each by Other route 3 (three) times daily. ICD-10 code: E11.40. 03/26/18   Wille Celeste, PA-C  hydrocortisone 2.5 % ointment Apply topically 2 (two) times daily. As needed for mild eczema.  Do not use for more than 1-2 weeks at a time. 11/25/19   Benay Pike, MD  hydrocortisone-pramoxine (PRAMOSONE) 1-2.5 % LOTN Apply to affected area twice a day as needed 12/11/19   Benay Pike, MD  hydrOXYzine (ATARAX/VISTARIL) 25 MG tablet Take 25 mg by mouth 2 (two) times daily as needed for anxiety.  10/01/19   [provider]  insulin degludec (TRESIBA FLEXTOUCH) 100 UNIT/ML FlexTouch Pen Inject 50 Units into the skin in the morning. 11/19/19   Zenia Resides, MD  Insulin Pen Needle (B-D UF III MINI PEN NEEDLES) 31G X 5 MM MISC USE AS DIRECTED TWICE DAILY 04/30/19   Benay Pike, MD  Insulin Pen Needle 29G X 12MM MISC Inject 1 pen into the skin 2 (two) times daily. Check blood sugar daily. 03/26/18   Granville Lewis C, PA-C  Insulin Syringe-Needle U-100 Flossie Buffy INSULIN SYRINGE) 30G X 5/16" 1 ML MISC USE AS DIRECTED 03/26/18   Granville Lewis C, PA-C  Methoxy PEG-Epoetin Beta (MIRCERA IJ) Mircera 01/31/19 01/30/20  [provider]  multivitamin (RENA-VIT) TABS tablet Take 1 tablet by mouth daily.    [provider]  nitroGLYCERIN (NITRODUR - DOSED IN MG/24 HR) 0.2 mg/hr patch Place 1 patch (0.2 mg total) onto the skin daily. Patient not taking: Reported on 11/12/2019 05/23/18   Rayburn, Neta Mends, PA-C  OneTouch Delica Lancets 84X MISC 1 Device by Does not apply route as needed (to check blood glucose). 03/26/18   Granville Lewis C, PA-C  pentoxifylline (TRENTAL) 400 MG CR tablet Take 1 tablet (400 mg total) by mouth 3 (three) times daily with meals. Patient not taking: Reported on 11/12/2019 05/23/18   Rayburn, Neta Mends, PA-C  progesterone (PROMETRIUM) 200 MG capsule Take 1 capsule (200  mg total) by mouth daily. Take for 10 days if bleeding occurs for more than 4 days. 11/09/10 03/28/11  Lyndal Pulley, DO  Past Surgical History Past Surgical History:  Procedure Laterality Date  . AMPUTATION Right 02/27/2018   Procedure: RIGHT FOOT 2ND AND 3RD RAY AMPUTATION;  Surgeon: [name], MD;  Location: [facility];  Service: Orthopedics;  Laterality: Right;  . AMPUTATION Right 04/24/2018   Procedure: RIGHT TRANSMETATARSAL AMPUTATION, PLACE VAC;  Surgeon: [name], MD;  Location: [facility];  Service: Orthopedics;  Laterality: Right;  . AV FISTULA PLACEMENT Left 04/15/2015   Procedure: ARTERIOVENOUS (AV) FISTULA CREATION;  Surgeon: Mal Misty, MD;  Location: [facility];  Service: Vascular;  Laterality: Left;  . EYE SURGERY Right    retinal detachment  . FISTULA SUPERFICIALIZATION Left 06/03/2015   Procedure: LEFT UPPER ARM BRACHIOCEPHALIC FISTULA SUPERFICIALIZATION;  Surgeon: Mal Misty, MD;  Location: [facility];  Service: Vascular;  Laterality: Left;  from [place] to General  . STUMP REVISION Right 05/24/2018   Procedure: REVISION RIGHT TRANSMETATARSAL AMPUTATION WITH EXCISION BONE/SOFT TISSUE, LOCAL TISSUE  RE-ARRANGEMENT  AND VAC PLACEMENT;  Surgeon: [name], MD;  Location: [facility];  Service: Orthopedics;  Laterality: Right;   Family History Family History  Problem Relation Age of Onset  . Heart disease Mother   . Heart disease Father     Social History Social History   Tobacco Use  . Smoking status: Never Smoker  . Smokeless tobacco: Never Used  Vaping Use  . Vaping Use: Never used  Substance Use Topics  . Alcohol use: No    Alcohol/week: 0.0 standard drinks  . Drug use: No   Allergies Patient has no known allergies.  Review of Systems Review of Systems All other systems are reviewed and are negative for acute change except as noted in  the HPI  Physical Exam Vital Signs  I have reviewed the triage vital signs BP 130/60   Pulse 83   Temp 97.9 F (36.6 C) (Oral)   Resp 18   LMP 09/18/2011   SpO2 82% on RA   Physical Exam Vitals reviewed.  Constitutional:      General: She is not in acute distress.    Appearance: She is well-developed. She is not diaphoretic.  HENT:     Head: Normocephalic and atraumatic.     Nose: Nose normal.  Eyes:     General: No scleral icterus.       Right eye: No discharge.        Left eye: No discharge.     Conjunctiva/sclera: Conjunctivae normal.     Pupils: Pupils are equal, round, and reactive to light.  Cardiovascular:     Rate and Rhythm: Normal rate and regular rhythm.     Heart sounds: No murmur heard.  No friction rub. No gallop.      Arteriovenous access: left arteriovenous access is present. Pulmonary:     Effort: Pulmonary effort is normal. No respiratory distress.     Breath sounds: Normal breath sounds. No stridor. No rales.  Abdominal:     General: There is no distension.     Palpations: Abdomen is soft.     Tenderness: There is no abdominal tenderness.  Musculoskeletal:        General: No tenderness.     Cervical back: Normal range of motion and neck supple.     Right lower leg: 2+ Pitting Edema present.     Left lower leg: 2+ Pitting Edema present.  Skin:    General: Skin is warm and dry.     Findings: No erythema or rash.  Neurological:     Mental Status: She is alert and oriented to person, place, and time.     ED Results and Treatments Labs (all labs ordered are listed, but only abnormal results are displayed) Labs Reviewed  BASIC METABOLIC PANEL - Abnormal; Notable for the following components:      Result Value   Chloride 96 (*)    Glucose, Bld 65 (*)    BUN 57 (*)    Creatinine, Ser 11.40 (*)    GFR, Estimated 3 (*)    Anion gap 16 (*)    All other components within normal limits  CBC - Abnormal; Notable for the following components:   WBC  25.7 (*)    RBC 2.75 (*)    Hemoglobin 8.9 (*)    HCT 30.4 (*)    MCV 110.5 (*)    MCHC 29.3 (*)    RDW 18.6 (*)    All other components within normal limits  CBG MONITORING, ED - Abnormal; Notable for the following components:   Glucose-Capillary 69 (*)    All other components within normal limits  CBG MONITORING, ED - Abnormal; Notable for the following components:   Glucose-Capillary 66 (*)    All other components within normal limits  CBG MONITORING, ED - Abnormal; Notable for the following components:   Glucose-Capillary 132 (*)    All other components within normal limits  RESP PANEL BY RT-PCR (FLU A&B, COVID) [id number]  CBG MONITORING, ED                                                                                                                         EKG  EKG Interpretation  Date/Time:    Ventricular Rate:    PR Interval:    QRS Duration:   QT Interval:    QTC Calculation:   R Axis:     Text Interpretation:        Radiology DG Chest 2 View  Result Date: 12/16/2019 CLINICAL DATA:  Fatigue.  Missed dialysis. EXAM: CHEST - 2 VIEW COMPARISON:  11/14/2019. FINDINGS: Cardiomegaly with mild bilateral interstitial prominence. Small left pleural effusion cannot be excluded. Findings suggest CHF. Pneumonitis cannot be excluded. Surgical clips right chest wall. No acute bony abnormality. IMPRESSION: Cardiomegaly with mild bilateral interstitial prominence and small left pleural effusion. Findings suggest CHF. Pneumonitis cannot be excluded. Electronically Signed   By: Marcello Moores  Register   On: 12/16/2019 05:21    Pertinent labs & imaging results that were available during my care of the patient were reviewed by me and considered in my medical decision making (see chart for details).  Medications Ordered in ED Medications - No data to display  Procedures .1-3 Lead EKG Interpretation Performed by: Fatima Blank, MD Authorized by: Fatima Blank, MD     Interpretation: normal     ECG rate:  89   ECG rate assessment: normal     Rhythm: sinus rhythm     Ectopy: none     Conduction: normal   .Critical Care Performed by: Fatima Blank, MD Authorized by: Fatima Blank, MD   Critical care provider statement:    Critical care time (minutes):  45   Critical care was necessary to treat or prevent imminent or life-threatening deterioration of the following conditions:  Respiratory failure   Critical care was time spent personally by me on the following activities:  Discussions with consultants, evaluation of patient's response to treatment, examination of patient, ordering and performing treatments and interventions, ordering and review of laboratory studies, ordering and review of radiographic studies, pulse oximetry, re-evaluation of patient's condition, obtaining history from patient or surrogate and review of old charts    (including critical care time)  Medical Decision Making / ED Course I have reviewed the nursing notes for this encounter and the patient's prior records (if available in EHR or on provided paperwork).   Rachel Chaney was evaluated in Emergency Department on 12/16/2019 for the symptoms described in the history of present illness. She was evaluated in the context of the global COVID-19 pandemic, which necessitated consideration that the patient might be at risk for infection with the SARS-CoV-2 virus that causes COVID-19. Institutional protocols and algorithms that pertain to the evaluation of patients at risk for COVID-19 are in a state of rapid change based on information released by regulatory bodies including the CDC and federal and state organizations. These policies and algorithms were followed during the patient's care in the ED.    Clinical Course as of Dec 15 720   Tue Dec 16, 2019  8938 Patient presents with generalized fatigue, since from [facility] medicine clinic for hypoxia and volume overload.  Patient does appear to be volume overloaded.  She is hypoxic on room air. Chest x-ray consistent with pulmonary edema from volume overload.  Patient is noted to have leukocytosis but is afebrile.  Patient's respiratory symptoms seem to be most consistent with volume overloaded.  Less likely pneumonia.  Will defer antibiotics to family medicine.  Patient was noted to be hypoglycemic but still mentating well.  She is on long-acting insulin.  Provided with p.o.    [PC]  0630 Blood sugars improved.   [PC]  [phone] Spoke with Dr. Candiss Norse, who agreed with admission for HD today and tomorrow.   [PC]  [phone] Spoke with family medicine who will admit patient for further management.   [PC]    Clinical Course User Index [PC] Areal Cochrane, Grayce Sessions, MD     Final Clinical Impression(s) / ED Diagnoses Final diagnoses:  Volume excess  Acute respiratory failure with hypoxia ([place])  Hypoglycemia      This chart was dictated using voice recognition software.  Despite best efforts to proofread,  errors can occur which can change the documentation meaning.   Fatima Blank, MD 12/16/19 [phone]

## 2019-12-16 NOTE — ED Notes (Signed)
Attempted for IV access. Pt has difficult veins due to having a L AV fistula. IV team consult placed.

## 2019-12-16 NOTE — ED Notes (Signed)
Pt provided with apple juice and graham crackers for low blood sugar.

## 2019-12-16 NOTE — Progress Notes (Signed)
FPTS Interim Progress Note  S: Per day team request, visited this patient after nighttime checkout.  Upon entering her room, patient was found sleeping comfortably in bed.  Head of bed elevated with TV and lights still on.  Patient appeared to be comfortable and in no acute distress.  I declined to wake this patient.   O: BP (!) 145/60 (BP Location: Right Arm)   Pulse 88   Temp 98.7 F (37.1 C)   Resp 19   LMP 09/18/2011   SpO2 95%   General: Sleeping comfortably in bed, no acute distress Respiratory: Normal respiratory effort, no respiratory distress  A/P: -Asked nurse to please call RT and have them place BiPAP, as it is recommended for her to wear nightly  -Please page the number below with any concerns or questions  Ezequiel Essex, MD 12/16/2019, 10:30 PM PGY-1, [facility] pager [phone]

## 2019-12-16 NOTE — Progress Notes (Signed)
Patient refused BIPAP for tonight. No respiratory distress noted at this time. RT instructed patient to have RT called if she changes her mind. RT will continue to monitor as needed.

## 2019-12-16 NOTE — ED Notes (Signed)
Lunch Tray Ordered @ 1036. 

## 2019-12-17 DIAGNOSIS — E8770 Fluid overload, unspecified: Secondary | ICD-10-CM | POA: Diagnosis not present

## 2019-12-17 DIAGNOSIS — D649 Anemia, unspecified: Secondary | ICD-10-CM

## 2019-12-17 DIAGNOSIS — J9601 Acute respiratory failure with hypoxia: Secondary | ICD-10-CM | POA: Diagnosis not present

## 2019-12-17 LAB — RENAL FUNCTION PANEL
Albumin: 2.3 g/dL — ABNORMAL LOW (ref 3.5–5.0)
Anion gap: 13 (ref 5–15)
BUN: 30 mg/dL — ABNORMAL HIGH (ref 8–23)
CO2: 28 mmol/L (ref 22–32)
Calcium: 9.1 mg/dL (ref 8.9–10.3)
Chloride: 96 mmol/L — ABNORMAL LOW (ref 98–111)
Creatinine, Ser: 7.7 mg/dL — ABNORMAL HIGH (ref 0.44–1.00)
GFR, Estimated: 6 mL/min — ABNORMAL LOW (ref >60)
Glucose, Bld: 203 mg/dL — ABNORMAL HIGH (ref 70–99)
Phosphorus: 6.9 mg/dL — ABNORMAL HIGH (ref 2.5–4.6)
Potassium: 4.1 mmol/L (ref 3.5–5.1)
Sodium: 137 mmol/L (ref 135–145)

## 2019-12-17 LAB — TYPE AND SCREEN
ABO/RH(D): O POS
Antibody Screen: NEGATIVE
Unit division: 0

## 2019-12-17 LAB — LACTATE DEHYDROGENASE: LDH: 150 U/L (ref 98–192)

## 2019-12-17 LAB — GLUCOSE, CAPILLARY
Glucose-Capillary: 161 mg/dL — ABNORMAL HIGH (ref 70–99)
Glucose-Capillary: 222 mg/dL — ABNORMAL HIGH (ref 70–99)
Glucose-Capillary: 254 mg/dL — ABNORMAL HIGH (ref 70–99)
Glucose-Capillary: 123 mg/dL — ABNORMAL HIGH (ref 70–99)

## 2019-12-17 LAB — CBC
HCT: 28 % — ABNORMAL LOW (ref 36.0–46.0)
Hemoglobin: 8.6 g/dL — ABNORMAL LOW (ref 12.0–15.0)
MCH: 31.9 pg (ref 26.0–34.0)
MCHC: 30.7 g/dL (ref 30.0–36.0)
MCV: 103.7 fL — ABNORMAL HIGH (ref 80.0–100.0)
Platelets: 311 10*3/uL (ref 150–400)
RBC: 2.7 MIL/uL — ABNORMAL LOW (ref 3.87–5.11)
RDW: 19.7 % — ABNORMAL HIGH (ref 11.5–15.5)
WBC: 21.1 10*3/uL — ABNORMAL HIGH (ref 4.0–10.5)
nRBC: 0 % (ref 0.0–0.2)

## 2019-12-17 LAB — BPAM RBC
Blood Product Expiration Date: 202201102359
ISSUE DATE / TIME: 202112071521
Unit Type and Rh: 5100

## 2019-12-17 LAB — FERRITIN: Ferritin: 670 ng/mL — ABNORMAL HIGH (ref 11–307)

## 2019-12-17 LAB — VITAMIN B12: Vitamin B-12: 434 pg/mL (ref 180–914)

## 2019-12-17 LAB — RETICULOCYTES
Immature Retic Fract: 27 % — ABNORMAL HIGH (ref 2.3–15.9)
RBC.: 2.6 MIL/uL — ABNORMAL LOW (ref 3.87–5.11)
Retic Count, Absolute: 171.6 10*3/uL (ref 19.0–186.0)
Retic Ct Pct: 6.6 % — ABNORMAL HIGH (ref 0.4–3.1)

## 2019-12-17 LAB — FOLATE: Folate: 8.3 ng/mL (ref >5.9)

## 2019-12-17 MED ORDER — ACETAMINOPHEN 325 MG PO TABS
650.0000 mg | ORAL_TABLET | Freq: Four times a day (QID) | ORAL | Status: DC | PRN
  Administered 2019-12-17 – 2019-12-18 (×4): 650 mg via ORAL
  Filled 2019-12-17 (×4): qty 2

## 2019-12-17 MED ORDER — FERRIC CITRATE 1 GM 210 MG(FE) PO TABS
840.0000 mg | ORAL_TABLET | Freq: Three times a day (TID) | ORAL | Status: DC
  Administered 2019-12-17 – 2019-12-18 (×3): 840 mg via ORAL
  Filled 2019-12-17 (×3): qty 4

## 2019-12-17 MED ORDER — PROSOURCE PLUS PO LIQD
30.0000 mL | Freq: Two times a day (BID) | ORAL | Status: DC
  Administered 2019-12-18: 30 mL via ORAL
  Filled 2019-12-17 (×2): qty 30

## 2019-12-17 MED ORDER — HYDROCERIN EX CREA
TOPICAL_CREAM | CUTANEOUS | Status: DC | PRN
  Filled 2019-12-17: qty 113

## 2019-12-17 MED ORDER — RENA-VITE PO TABS
1.0000 | ORAL_TABLET | Freq: Every day | ORAL | Status: DC
  Administered 2019-12-17: 1 via ORAL
  Filled 2019-12-17: qty 1

## 2019-12-17 MED ORDER — DM-GUAIFENESIN ER 30-600 MG PO TB12
1.0000 | ORAL_TABLET | Freq: Two times a day (BID) | ORAL | Status: DC | PRN
  Administered 2019-12-17: 1 via ORAL
  Filled 2019-12-17: qty 1

## 2019-12-17 MED ORDER — ASPIRIN EC 81 MG PO TBEC
81.0000 mg | DELAYED_RELEASE_TABLET | Freq: Every day | ORAL | Status: DC
  Administered 2019-12-17 – 2019-12-18 (×2): 81 mg via ORAL
  Filled 2019-12-17 (×2): qty 1

## 2019-12-17 MED ORDER — NIVEA EX CREA: TOPICAL_CREAM | CUTANEOUS | Status: DC | PRN

## 2019-12-17 NOTE — Plan of Care (Signed)
  Problem: Activity: Goal: Risk for activity intolerance will decrease Outcome: Progressing   

## 2019-12-17 NOTE — Discharge Summary (Addendum)
[facility] Discharge Summary  Patient name: Rachel Chaney Medical record number: [id number] Date of birth: Mar 22, 1960 Age: 59 y.o. Gender: female Date of Admission: 12/15/2019  Date of Discharge: 12/18/2019 Admitting Physician: Martyn Malay, MD  Primary Care Provider: Benay Pike, MD Consultants: Nephrology  Indication for Hospitalization: Weakness with hypoglycemia  Discharge Diagnoses/Problem List:  Acute hypoxemic respiratory failure Acute on chronic anemia Leukocytosis Hypoglycemia  T2DM ESRD on hemodialysis  Disposition: Home  Discharge Condition: Stable, improved  Discharge Exam:  Gen: NAD, supine in bed, initially sleeping CV: RRR, no m/r/g appreciated, 1+ pitting edema of right LE Pulm: minimal crackles in right lung base, normal WOB,  Abd: soft, non-tender, obese  Brief Hospital Course:  Rachel Chaney is a 59 y.o. female that presented with fluid overload 2/2 missed dialysis. PMH significant for ESRD on HD M/W/F, T2DM, HFpEF, HTN, h/o MI (2020), HDL   Acute hypoxic respiratory failure, likely secondary to missed dialysis, resolved Patient was admitted with weakness for 1 week after missing hemodialysis. Physical exam significant for 3+ lower extremity pitting edema. Patient was placed on 3L of O2 in the ED. Labs: Cr of 11.4 and BUN 57. Patient's symptoms improved with 2 hemodialysis sessions before discharge.   ESRD on hemodialysis, stable Patient reported recent weight gain on 40lbs. Creatinine on admission 11.60 and BUN of 57. Patient received hemodialysis after admission and was restarted back on scheduled dialysis M/W/F.   Hypoglycemia in the setting of T2DM, resolved Patient was admitted with CBG 65 and an anion gap of 16. Patient reportedly took insulin and did not eat prior to admission. Resolved with increased oral intake. Home medications were initially held and patient on sliding scale insulin while hospitalized.  Home Lantus was resumed before discharge.    Leukocytosis Patient was afebrile, CXR showed small left pleural effusion. Blood cultures were collected to rule out bacteremia with no growth to date upon discharge. Antibiotics were not initiated as there was low suspicion for infection. Patient has a history of persistent leukocytosis and last seen by hematology in 2016, likely due to ongoing chronic illness and diabetes.   Acute on chronic macrocytic anemia Patient has unclear etiology of acute anemia in the setting of chronic anemia. Patient Hb on admission 7.5 and transfused 1U PRBC. Hb 8.7 on discharge. Reticulocytes were elevated on further labs. Will need hematology and GI follow-up.  All other chronic conditions were stable and addressed with home medications as appropriate.   PCP recommendations 1. Pt has persistent leukocytosis,consider referral to Hematology as outpatient.  2. OP colonoscopy for investigation of anemia 3. Follow-up with diabetes education and further monitoring of blood sugar levels.     Significant Procedures: Hemodialysis  Significant Labs and Imaging:  Recent Labs  Lab 12/16/19 1115 12/16/19 1946 12/17/19 0443 12/18/19 0017  WBC 27.3*  --  21.1* 21.3*  HGB 7.5* 9.8* 8.6* 8.7*  HCT 24.4* 31.2* 28.0* 27.6*  PLT 342  --  311 297   Recent Labs  Lab 12/15/19 1237 12/16/19 1115 12/17/19 0443 12/18/19 0017  NA 138 138 137 135  K 4.4 5.0 4.1 4.5  CL 96* 93* 96* 95*  CO2 _0 GLUCOSE 65* 251* 203* 156*  BUN 57* 67* 30* 39*  CREATININE 11.40* 12.56* 7.70* 9.12*  CALCIUM 9.8 9.2 9.1 8.8*  MG  --  2.4  --   --   PHOS  --  9.4* 6.9*  --   ALBUMIN  --   --  2.3*  --     DG Chest 2 View  Result Date: 12/16/2019 CLINICAL DATA:  Fatigue.  Missed dialysis. EXAM: CHEST - 2 VIEW COMPARISON:  11/14/2019. FINDINGS: Cardiomegaly with mild bilateral interstitial prominence. Small left pleural effusion cannot be excluded. Findings suggest CHF.  Pneumonitis cannot be excluded. Surgical clips right chest wall. No acute bony abnormality. IMPRESSION: Cardiomegaly with mild bilateral interstitial prominence and small left pleural effusion. Findings suggest CHF. Pneumonitis cannot be excluded. Electronically Signed   By: Marcello Moores  Register   On: 12/16/2019 05:21    Results/Tests Pending at Time of Discharge: Blood cultures  Discharge Medications:  Allergies as of 12/18/2019   No Known Allergies     Medication List    TAKE these medications   acetaminophen 500 MG tablet Commonly known as: TYLENOL Take 1,000 mg by mouth every 6 (six) hours as needed for mild pain or headache.   aspirin EC 81 MG tablet Take 1 tablet (81 mg total) by mouth daily. Notes to patient: 12/19/2019   atorvastatin 80 MG tablet Commonly known as: LIPITOR TAKE 1 TABLET(80 MG) BY MOUTH AT BEDTIME What changed: See the new instructions. Notes to patient: 12/18/2019   cetirizine 10 MG tablet Commonly known as: ZYRTEC Take 10 mg by mouth as needed for allergies.   cyclobenzaprine 10 MG tablet Commonly known as: FLEXERIL TAKE 1 TABLET(10 MG) BY MOUTH THREE TIMES DAILY BETWEEN MEALS AND AT BEDTIME AS NEEDED FOR MUSCLE SPASMS What changed: See the new instructions. Notes to patient: 12/18/2019   ferric citrate 1 GM 210 MG(Fe) tablet Commonly known as: Auryxia Take 2 tablets (420 mg total) by mouth 3 (three) times daily with meals. Give two (420 mg) by mouth three times a day wih meals for ESRD What changed:   how much to take  when to take this  additional instructions Notes to patient: 12/18/2019   fluticasone 50 MCG/ACT nasal spray Commonly known as: FLONASE Place 1 spray into both nostrils daily. What changed:   when to take this  reasons to take this Notes to patient: 12/19/2019   glucose blood test strip Commonly known as: ONE TOUCH ULTRA TEST 1 each by Other route 3 (three) times daily. ICD-10 code: E11.40. Notes to patient: Continue home  schedule    hydrocortisone 2.5 % ointment Apply topically 2 (two) times daily. As needed for mild eczema.  Do not use for more than 1-2 weeks at a time. What changed:   how much to take  when to take this  reasons to take this  additional instructions   hydrocortisone-pramoxine 1-2.5 % Lotn Commonly known as: PRAMOSONE Apply to affected area twice a day as needed What changed:   how much to take  how to take this  when to take this  additional instructions   hydrOXYzine 25 MG tablet Commonly known as: ATARAX/VISTARIL Take 25 mg by mouth 2 (two) times daily as needed for anxiety.   Insulin Pen Needle 29G X 12MM Misc Inject 1 pen into the skin 2 (two) times daily. Check blood sugar daily. Notes to patient: Continue home schedule    B-D UF III MINI PEN NEEDLES 31G X 5 MM Misc Generic drug: Insulin Pen Needle USE AS DIRECTED TWICE DAILY Notes to patient: Continue home schedule    Insulin Syringe-Needle U-100 30G X 5/16" 1 ML Misc Commonly known as: UltiCare Insulin Syringe USE AS DIRECTED Notes to patient: Continue home schedule    MIRCERA IJ Mircera   multivitamin Tabs tablet  Take 1 tablet by mouth daily. Notes to patient: 12/19/2019   ONE TOUCH ULTRA 2 w/Device Kit 1 kit by Does not apply route 3 (three) times daily. ICD-10 code: E11.40 Notes to patient: Continue home schedule    OneTouch Delica Lancets 03V Misc 1 Device by Does not apply route as needed (to check blood glucose).   PARSABIV IV Inject into the vein. Notes to patient: Continue home schedule    Tresiba FlexTouch 100 UNIT/ML FlexTouch Pen Generic drug: insulin degludec Inject 50 Units into the skin in the morning. Notes to patient: 12/19/2019       Discharge Instructions: Please refer to Patient Instructions section of EMR for full details.  Patient was counseled important signs and symptoms that should prompt return to medical care, changes in medications, dietary instructions,  activity restrictions, and follow up appointments.   Follow-Up Appointments:  Follow-up Information    [place]. Go on 12/22/2019.   Why: at 11 AM.  Please arrive 15 min prior to your appointment time. Contact information: [place] [phone]              Lattie Haw, MD 12/18/2019, 3:35 PM PGY-2, [place]

## 2019-12-17 NOTE — Evaluation (Signed)
Occupational Therapy Evaluation Patient Details Name: Rachel Chaney MRN: [id number] DOB: 1960-02-04 Today's Date: 12/17/2019    History of Present Illness 59 y.o. female with a past medical history listed below including ESRD on HD MWF since 2017, DM with neuropathy, s/p R transmet amputation with revision, HTN, HLD, cardiac arrest who presents to the emergency department for generalized fatigue since dialysis on Wednesday. Pt missed x2 OP HD appointments.   Clinical Impression   Pt presents with decline in function and safety with ADLs and ADL mobility with impaired endurance and balance. Pt required encouragement for EOB and OOB activity. Pt's functional deficits are listed below and pt would benefit from acute OT services to address impairments to maximize level of function and safety    Follow Up Recommendations  No OT follow up    Equipment Recommendations  None recommended by OT    Recommendations for Other Services       Precautions / Restrictions Precautions Precautions: Fall Restrictions Weight Bearing Restrictions: No      Mobility Bed Mobility Overal bed mobility: Needs Assistance Bed Mobility: Supine to Sit;Sit to Supine     Supine to sit: Supervision Sit to supine: Supervision   General bed mobility comments: Pt sat EOB and back to supine with sup, no physical assist, increased time    Transfers Overall transfer level: Needs assistance   Transfers: Sit to/from Stand;Stand Pivot Transfers Sit to Stand: Min guard Stand pivot transfers: Min guard       General transfer comment: pt declines    Balance Overall balance assessment: Needs assistance   Sitting balance-Leahy Scale: Good     Standing balance support: Bilateral upper extremity supported Standing balance-Leahy Scale: Poor                             ADL either performed or assessed with clinical judgement   ADL Overall ADL's : Needs assistance/impaired Eating/Feeding:  Independent   Grooming: Wash/dry hands;Wash/dry face;Min guard;Standing   Upper Body Bathing: Set up;Supervision/ safety;Sitting   Lower Body Bathing: Min guard   Upper Body Dressing : Set up;Supervision/safety;Sitting   Lower Body Dressing: Min guard   Toilet Transfer: Min guard;Stand-pivot;BSC   Toileting- Clothing Manipulation and Hygiene: Min guard;Sit to/from stand       Functional mobility during ADLs: Min guard;Rolling walker       Vision Baseline Vision/History: Wears glasses Patient Visual Report: No change from baseline       Perception     Praxis      Pertinent Vitals/Pain Pain Assessment: No/denies pain Pain Score: 0-No pain Pain Location: neck, arms Pain Descriptors / Indicators: Aching;Other (Comment) ("it's just pain") Pain Intervention(s): Limited activity within patient's tolerance;Monitored during session;Repositioned     Hand Dominance Right   Extremity/Trunk Assessment Upper Extremity Assessment Upper Extremity Assessment: Overall WFL for tasks assessed   Lower Extremity Assessment Lower Extremity Assessment: Defer to PT evaluation   Cervical / Trunk Assessment Cervical / Trunk Assessment: Other exceptions Cervical / Trunk Exceptions: forward head, neck stiffness but AROM roughly [facility]   Communication Communication Communication: No difficulties   Cognition Arousal/Alertness: Awake/alert Behavior During Therapy: WFL for tasks assessed/performed Overall Cognitive Status: Within Functional Limits for tasks assessed                                     General Comments  Anasarca-like  edema, RLE>LLE    Exercises General Exercises - Lower Extremity Heel Slides: AROM;Both;Supine;10 reps Straight Leg Raises: AROM;Both;10 reps;Supine   Shoulder Instructions      Home Living Family/patient expects to be discharged to:: Private residence Living Arrangements: Spouse/significant other;Children Available Help at Discharge:  Family;Available 24 hours/day Type of Home: House Home Access: Stairs to enter [employer] of Steps: 3 Entrance Stairs-Rails: Right Home Layout: One level     Bathroom Shower/Tub: Teacher, early years/pre: Standard     Home Equipment: Environmental consultant - 2 wheels;Cane - single point;Grab bars - tub/shower;Wheelchair - manual;Shower seat          Prior Functioning/Environment Level of Independence: Independent with assistive device(s)        Comments: pt reports sometimes using rollator. Pt reports independence with ADLs        OT Problem List: Decreased activity tolerance;Decreased knowledge of use of DME or AE;Impaired balance (sitting and/or standing)      OT Treatment/Interventions: Self-care/ADL training;Therapeutic exercise;Balance training;Patient/family education;DME and/or AE instruction    OT Goals(Current goals can be found in the care plan section) Acute Rehab OT Goals Patient Stated Goal: go home OT Goal Formulation: With patient Time For Goal Achievement: 12/31/19 Potential to Achieve Goals: Good ADL Goals Pt Will Perform Grooming: with supervision;with modified independence;standing Pt Will Perform Lower Body Bathing: with supervision;with modified independence;sit to/from stand Pt Will Perform Lower Body Dressing: with supervision;with modified independence;sit to/from stand Pt Will Transfer to Toilet: with supervision;with modified independence;ambulating;bedside commode;regular height toilet;grab bars;stand pivot transfer Pt Will Perform Toileting - Clothing Manipulation and hygiene: with supervision;with modified independence;sit to/from stand  OT Frequency: Min 2X/week   Barriers to D/C:            Co-evaluation              AM-PAC OT "6 Clicks" Daily Activity     Outcome Measure Help from another person eating meals?: None Help from another person taking care of personal grooming?: A Little Help from another person toileting,  which includes using toliet, bedpan, or urinal?: A Little Help from another person bathing (including washing, rinsing, drying)?: A Little Help from another person to put on and taking off regular upper body clothing?: None Help from another person to put on and taking off regular lower body clothing?: A Little 6 Click Score: 20   End of Session Equipment Utilized During Treatment: Gait belt;Rolling walker;Other (comment) (BSC)  Activity Tolerance: Patient tolerated treatment well Patient left: in bed;with call bell/phone within reach  OT Visit Diagnosis: Unsteadiness on feet (R26.81);Other abnormalities of gait and mobility (R26.89)                Time: 8003-4917 OT Time Calculation (min): 33 min Charges:  OT General Charges $OT Visit: 1 Visit OT Evaluation $OT Eval Low Complexity: 1 Low OT Treatments $Self Care/Home Management : 8-22 mins    Britt Bottom 12/17/2019, 3:48 PM

## 2019-12-17 NOTE — Progress Notes (Addendum)
Subjective: Seen/examined in room feels better 3 L UF yesterday hemodialysis tolerated, dialysis again today to keep on schedule  Objective Vital signs in last 24 hours: Vitals:   12/16/19 2308 12/17/19 0301 12/17/19 0654 12/17/19 0905  BP: (!) 139/56 (!) 108/49 (!) 118/52 (!) 121/57  Pulse: 88 93 80 81  Resp: 18 18 18 18   Temp: (!) 97.5 F (36.4 C) 99 F (37.2 C) 99 F (37.2 C) 98.3 F (36.8 C)  TempSrc: Oral Oral    SpO2: 94% (!) 89% 93% 95%   Weight change:   Physical Exam: General:  Alert, NAD Heart: RRR, no MRG Lungs: CTA nonlabored breathing Abdomen: Bowel sounds normoactive, obese soft nontender nondistended , has some abdominal wall edema noted Extremities:  Improving bipedal edema to the thighs 1+, right transmit  amputation site clean dry Dialysis Access: Positive bruit left upper arm AVF  OP Dialysis Orders: Center: [place] on MWF, 3.5 hours, EDW 98.5 kg, Bath two 2K, 2 calcium, UF profile 4, Sensipar 30 q. hemodialysis, Hectorol 11 MCG given q. dialysis, heparin 8000 units q. dialysis, accesses left upper arm AV fistula, no Mircera or iron given  Problem/Plan:\ 1. Volume overload= secondary to missed hemodialysis x2 with anasarca appearance, hemodialysis yesterday on admit and today to keep on schedule and attempt UF  as able 2. ESRD -HD schedule MWF hemodialysis as above 3. Weakness= probable multifocal = uremia missed dialysis x2 work-up per admit team seen in chair today feeling better 4. Hypertension/volume  -volume overload secondary to 2 dialysis treatments missed plan hemotoday as above UF as tolerated 5. Anemia  -Hgb 7.5 > 8.6, yesterday on hemo-noted admit team ordered 1 unit packed red blood cell /she denies any active bleeding, low Hgb could be dilutional and also missed ESA as an outpatient /no outpatient Mircera since October 21, 2029, ordered 60 MCG  Aranesp q. Wednesday dialysis 6. Metabolic bone disease -Hectorol IV on dialysis, hold vitamin D 2/2   corrected calcium over 10 , give,Sensipar ,noted phosphorus 9.4 reflecting her noncompliance with HD and binders , this a.m. phosphorus  6.9 continue binder 7. Nutrition -renal carb modified Albumin 2.7 > 2.3 give protein supplement 8. Diabetes mellitus type 2 per admit  Ernest Haber, PA-C [facility] [phone] 12/17/2019,12:24 PM  LOS: 1 day   Labs: Basic Metabolic Panel: Recent Labs  Lab 12/15/19 1237 12/16/19 1115 12/17/19 0443  NA 138 138 137  K 4.4 5.0 4.1  CL 96* 93* 96*  CO2 26 24 28   GLUCOSE 65* 251* 203*  BUN 57* 67* 30*  CREATININE 11.40* 12.56* 7.70*  CALCIUM 9.8 9.2 9.1  PHOS  --  9.4* 6.9*   Liver Function Tests: Recent Labs  Lab 12/17/19 0443  ALBUMIN 2.3*   No results for input(s): LIPASE, AMYLASE in the last 168 hours. No results for input(s): AMMONIA in the last 168 hours. CBC: Recent Labs  Lab 12/15/19 1237 12/15/19 1237 12/16/19 1115 12/16/19 1946 12/17/19 0443  WBC 25.7*  --  27.3*  --  21.1*  NEUTROABS  --   --  23.5*  --   --   HGB 8.9*   < > 7.5* 9.8* 8.6*  HCT 30.4*   < > 24.4* 31.2* 28.0*  MCV 110.5*  --  108.4*  --  103.7*  PLT 339  --  342  --  311   < > = values in this interval not displayed.   Cardiac Enzymes: No results for input(s): CKTOTAL, CKMB, CKMBINDEX,  TROPONINI in the last 168 hours. CBG: Recent Labs  Lab 12/16/19 0647 12/16/19 1205 12/16/19 2307 12/17/19 0649 12/17/19 1144  GLUCAP 132* 246* 241* 161* 222*    Studies/Results: DG Chest 2 View  Result Date: 12/16/2019 CLINICAL DATA:  Fatigue.  Missed dialysis. EXAM: CHEST - 2 VIEW COMPARISON:  11/14/2019. FINDINGS: Cardiomegaly with mild bilateral interstitial prominence. Small left pleural effusion cannot be excluded. Findings suggest CHF. Pneumonitis cannot be excluded. Surgical clips right chest wall. No acute bony abnormality. IMPRESSION: Cardiomegaly with mild bilateral interstitial prominence and small left pleural effusion. Findings  suggest CHF. Pneumonitis cannot be excluded. Electronically Signed   By: Marcello Moores  Register   On: 12/16/2019 05:21   Medications:  . sodium chloride   Intravenous Once  . aspirin EC  81 mg Oral Daily  . atorvastatin  80 mg Oral QHS  . cinacalcet  30 mg Oral Q M,W,F-HD  . darbepoetin (ARANESP) injection - DIALYSIS  60 mcg Intravenous Q Wed-HD  . feeding supplement (NEPRO CARB STEADY)  237 mL Oral BID BM  . ferric citrate  840 mg Oral TID WC  . fluticasone  1 spray Each Nare Daily  . insulin aspart  0-6 Units Subcutaneous TID WC  . lidocaine  1 patch Transdermal Q24H  . loratadine  10 mg Oral Daily

## 2019-12-17 NOTE — Plan of Care (Signed)
  Problem: Education: Goal: Knowledge of General Education information will improve Description Including pain rating scale, medication(s)/side effects and non-pharmacologic comfort measures Outcome: Progressing   Problem: Health Behavior/Discharge Planning: Goal: Ability to manage health-related needs will improve Outcome: Progressing   

## 2019-12-17 NOTE — Progress Notes (Signed)
Initial Nutrition Assessment  DOCUMENTATION CODES:   Obesity unspecified  INTERVENTION:   Pro-Source 30 mL BID, each packet provides 100 calories and 15 g of protein  D/C Nepro as pt will not consume  Add Rena-Vite  NUTRITION DIAGNOSIS:   Increased nutrient needs related to chronic illness as evidenced by estimated needs.  GOAL:   Patient will meet greater than or equal to 90% of their needs  MONITOR:   PO intake, Supplement acceptance, Labs, Weight trends  REASON FOR ASSESSMENT:   Malnutrition Screening Tool    ASSESSMENT:    Pt is a 59 y.o. female with PMH of ESRD on HD, T2DM, neuropathy, R transmetatarsal amputation, and persistent leukocytosis. Pt admitted with weakness and volume overload after missing 2 days of dialysis in the last week. Hx of noncompliance with not attending complete dialysis treatments.   Recorded po intake 75% of meals today  Pt stated she consumes 3 meals a day on dialysis and non-dialysis days. Pt reports no change in appetite. Typical intake: B: eggs, bacon, L: tuna salad with crackers, D: meat/starch/vegetable plate.   Pt reports she takes her phosphate binders with each meal. Pt reports she takes a chewable binder that starts with a "V" but noted ferric citrate as binder listed in outpatient med record. Currently does not have phosphate binder ordered. Phosphorus is elevated. Discussed with nephrology, MD to order.   Pt stated she does not like Nepro but does take LiquaCel at HD. She denies taking any oral nutrition supplements at home.   Pt reports no recent weight loss. No weight documented this admission. She reports her UBW is 102.2 kg. Reports her EDW is 84 kg.  Per chart review, documented outpatient EDW is 98.5 kg.   Pt reports her mobility is limited. She uses a walker at home and is not active other than walking short distances. Based on her physical exam, pt had mild muscle depletion in both lower extremities but this can be  attributed her limited mobility.      Discussed importance of adhering to diet, especially fluid restriction. Discussed what constistutes a fluid, including ice as pt reports she likes to eat ice. Also reviewed ideas for relieving dry mouth/thirst including Biotene mouthwash, using sour sugar free candy and/or lemons/limes to flavor drinks to produce saliva, etc. Pt appears to be receptive.   Labs reviewed: Phosphorus 6.9 (high), CBG's x 24 hours: 89-246, corrected calcium: 10.5  Medications reviewed and include: Sensipar, Aranesp, SSI, Nepro BID   NUTRITION - FOCUSED PHYSICAL EXAM:    Most Recent Value  Orbital Region No depletion  Upper Arm Region No depletion  Thoracic and Lumbar Region No depletion  Buccal Region No depletion  Temple Region No depletion  Clavicle Bone Region No depletion  Clavicle and Acromion Bone Region No depletion  Scapular Bone Region No depletion  Dorsal Hand No depletion  Patellar Region Mild depletion  Anterior Thigh Region Mild depletion  Posterior Calf Region Mild depletion  Edema (RD Assessment) Mild  Hair Reviewed  Eyes Reviewed  Mouth Reviewed  Skin Reviewed  Nails Unable to assess       Diet Order:   Diet Order            Diet renal/carb modified with fluid restriction Diet-HS Snack? Nothing; Fluid restriction: 1200 mL Fluid; Room service appropriate? Yes; Fluid consistency: Thin  Diet effective now                 EDUCATION NEEDS:  Education needs have been addressed  Skin:  Skin Assessment: Reviewed RN Assessment  Last BM:  12/16/19  Height:   Ht Readings from Last 1 Encounters:  12/15/19 5\' 4"  (1.626 m)    Weight:   Wt Readings from Last 1 Encounters:  12/15/19 102.2 kg    Ideal Body Weight:   (IBW 55 kg; EDW 98.5 kg)  BMI:  There is no height or weight on file to calculate BMI.  Estimated Nutritional Needs:   Kcal:  1800-2000 kcals  Protein:  115-130 g  Fluid:  1000 + UOP    Ronnald Nian, Dietetic  Intern Pager: [phone] If unavailable: [phone]

## 2019-12-17 NOTE — Evaluation (Signed)
Physical Therapy Evaluation Patient Details Name: Rachel Chaney MRN: [id number] DOB: 1960-06-13 Today's Date: 12/17/2019   History of Present Illness  59 y.o. female with a past medical history listed below including ESRD on HD MWF since 2017, DM with neuropathy, s/p R transmet amputation with revision, HTN, HLD, cardiac arrest who presents to the emergency department for generalized fatigue since dialysis on Wednesday. Pt missed x2 OP HD appointments.  Clinical Impression   Pt presents with generalized weakness, increased time and effort to mobilize, and decreased activity tolerance vs baseline. Pt to benefit from acute PT to address deficits. Pt tolerating repositioning and pull-to-sit only this day, states she has been up all day in recliner and declines OOB attempt, but states she is mobilizing well and near baseline. PT will reassess tomorrow, suspect no follow up PT needs. Pt instructed in LE exercises to reduce swelling and maintain LE strength. PT to progress mobility as tolerated, and will continue to follow acutely.      Follow Up Recommendations Supervision for mobility/OOB (pt politely declines PT follow up)    Equipment Recommendations  None recommended by PT    Recommendations for Other Services       Precautions / Restrictions Precautions Precautions: Fall (moderate) Restrictions Weight Bearing Restrictions: No      Mobility  Bed Mobility Overal bed mobility: Needs Assistance             General bed mobility comments: pt declines EOB or OOB, but able to pull to sit with mod I (increased time)    Transfers                 General transfer comment: pt declines  Ambulation/Gait             General Gait Details: pt declines  Stairs            Wheelchair Mobility    Modified Rankin (Stroke Patients Only)       Balance Overall balance assessment:  (unable to assess today, pt declines OOB. Pt reports 0 falls in the past)                                            Pertinent Vitals/Pain Pain Assessment: 0-10 Pain Score: 8  Pain Location: neck, arms Pain Descriptors / Indicators: Aching;Other (Comment) ("it's just pain") Pain Intervention(s): Limited activity within patient's tolerance;Monitored during session;Repositioned    Home Living Family/patient expects to be discharged to:: Private residence Living Arrangements: Spouse/significant other;Children Available Help at Discharge: Family;Available 24 hours/day Type of Home: House Home Access: Stairs to enter Entrance Stairs-Rails: Right Entrance Stairs-Number of Steps: 3 Home Layout: One level Home Equipment: Walker - 2 wheels;Cane - single point;Grab bars - tub/shower;Wheelchair - manual;Shower seat      Prior Function Level of Independence: Independent with assistive device(s)         Comments: pt reports sometimes using rollator. Pt reports independence with ADLs     Hand Dominance   Dominant Hand: Right    Extremity/Trunk Assessment   Upper Extremity Assessment Upper Extremity Assessment: Defer to OT evaluation    Lower Extremity Assessment Lower Extremity Assessment: Generalized weakness    Cervical / Trunk Assessment Cervical / Trunk Assessment: Other exceptions Cervical / Trunk Exceptions: forward head, neck stiffness but AROM roughly [facility]  Communication   Communication: No difficulties  Cognition Arousal/Alertness: Awake/alert Behavior  During Therapy: WFL for tasks assessed/performed Overall Cognitive Status: Within Functional Limits for tasks assessed                                        General Comments General comments (skin integrity, edema, etc.): Anasarca-like edema, RLE>LLE    Exercises General Exercises - Lower Extremity Heel Slides: AROM;Both;Supine;10 reps Straight Leg Raises: AROM;Both;10 reps;Supine   Assessment/Plan    PT Assessment Patient needs continued PT services  PT  Problem List Decreased strength;Decreased mobility;Decreased activity tolerance;Pain;Obesity       PT Treatment Interventions DME instruction;Therapeutic activities;Gait training;Patient/family education;Therapeutic exercise;Balance training;Stair training;Functional mobility training;Neuromuscular re-education    PT Goals (Current goals can be found in the Care Plan section)  Acute Rehab PT Goals Patient Stated Goal: go home PT Goal Formulation: With patient Time For Goal Achievement: 12/31/19 Potential to Achieve Goals: Good    Frequency Min 3X/week   Barriers to discharge        Co-evaluation               AM-PAC PT "6 Clicks" Mobility  Outcome Measure Help needed turning from your back to your side while in a flat bed without using bedrails?: A Little Help needed moving from lying on your back to sitting on the side of a flat bed without using bedrails?: A Little Help needed moving to and from a bed to a chair (including a wheelchair)?: A Little Help needed standing up from a chair using your arms (e.g., wheelchair or bedside chair)?: A Little Help needed to walk in hospital room?: A Little Help needed climbing 3-5 steps with a railing? : A Little 6 Click Score: 18    End of Session Equipment Utilized During Treatment: Oxygen (2LO2 at baseline) Activity Tolerance: Patient limited by fatigue;Patient limited by pain Patient left: in bed;with call bell/phone within reach Nurse Communication: Mobility status PT Visit Diagnosis: Other abnormalities of gait and mobility (R26.89);Muscle weakness (generalized) (M62.81)    Time: 5809-9833 PT Time Calculation (min) (ACUTE ONLY): 13 min   Charges:   PT Evaluation $PT Eval Low Complexity: 1 Low          Majestic Molony E, PT Acute Rehabilitation Services Pager [phone]  Office [phone]   Sofie Schendel D Elonda Husky 12/17/2019, 2:11 PM

## 2019-12-17 NOTE — Progress Notes (Signed)
Rachel Minors, RN called and notifiedthe pt HD treatment has been moved to 12/18/19.

## 2019-12-17 NOTE — Telephone Encounter (Signed)
Pt currently admitted to hospital.  Will discuss alternative treatments for her pruritic rash after her discharge.

## 2019-12-18 DIAGNOSIS — J9601 Acute respiratory failure with hypoxia: Secondary | ICD-10-CM

## 2019-12-18 DIAGNOSIS — E8779 Other fluid overload: Secondary | ICD-10-CM

## 2019-12-18 LAB — BASIC METABOLIC PANEL
Anion gap: 15 (ref 5–15)
BUN: 39 mg/dL — ABNORMAL HIGH (ref 8–23)
CO2: 25 mmol/L (ref 22–32)
Calcium: 8.8 mg/dL — ABNORMAL LOW (ref 8.9–10.3)
Chloride: 95 mmol/L — ABNORMAL LOW (ref 98–111)
Creatinine, Ser: 9.12 mg/dL — ABNORMAL HIGH (ref 0.44–1.00)
GFR, Estimated: 5 mL/min — ABNORMAL LOW (ref >60)
Glucose, Bld: 156 mg/dL — ABNORMAL HIGH (ref 70–99)
Potassium: 4.5 mmol/L (ref 3.5–5.1)
Sodium: 135 mmol/L (ref 135–145)

## 2019-12-18 LAB — CBC
HCT: 27.6 % — ABNORMAL LOW (ref 36.0–46.0)
Hemoglobin: 8.7 g/dL — ABNORMAL LOW (ref 12.0–15.0)
MCH: 32.5 pg (ref 26.0–34.0)
MCHC: 31.5 g/dL (ref 30.0–36.0)
MCV: 103 fL — ABNORMAL HIGH (ref 80.0–100.0)
Platelets: 297 10*3/uL (ref 150–400)
RBC: 2.68 MIL/uL — ABNORMAL LOW (ref 3.87–5.11)
RDW: 18.9 % — ABNORMAL HIGH (ref 11.5–15.5)
WBC: 21.3 10*3/uL — ABNORMAL HIGH (ref 4.0–10.5)
nRBC: 0 % (ref 0.0–0.2)

## 2019-12-18 LAB — GLUCOSE, CAPILLARY
Glucose-Capillary: 110 mg/dL — ABNORMAL HIGH (ref 70–99)
Glucose-Capillary: 91 mg/dL (ref 70–99)

## 2019-12-18 LAB — HAPTOGLOBIN: Haptoglobin: 236 mg/dL (ref 37–355)

## 2019-12-18 MED ORDER — CINACALCET HCL 30 MG PO TABS
ORAL_TABLET | ORAL | Status: AC
  Filled 2019-12-18: qty 1

## 2019-12-18 MED ORDER — DARBEPOETIN ALFA 60 MCG/0.3ML IJ SOSY
PREFILLED_SYRINGE | INTRAMUSCULAR | Status: AC
  Filled 2019-12-18: qty 0.3

## 2019-12-18 MED ORDER — DARBEPOETIN ALFA 60 MCG/0.3ML IJ SOSY
60.0000 ug | PREFILLED_SYRINGE | INTRAMUSCULAR | Status: DC
  Administered 2019-12-18: 60 ug via INTRAVENOUS

## 2019-12-18 NOTE — TOC Transition Note (Signed)
Transition of Care [facility]) - CM/SW Discharge Note   Patient Details  Name: Rachel Chaney MRN: [id number] Date of Birth: 27-Apr-1960  [facility] CM/SW Contact:  Bartholomew Crews, RN Phone Number: [phone] 12/18/2019, 1:44 PM   Clinical Narrative:     Patient to transition home today. Notified by [facility] that patient is active for PT. HH order placed for resumption of services. No further TOC needs. Identified.   Final next level of care: [place] Barriers to Discharge: No Barriers Identified   Patient Goals and CMS Choice        Discharge Placement                       Discharge Plan and Services                                     Social Determinants of Health (SDOH) Interventions     Readmission Risk Interventions No flowsheet data found.

## 2019-12-18 NOTE — Discharge Instructions (Signed)
We are so glad to have been your care team during your hospitalization. You were hospitalized for respiratory failure due to your breathing difficulty, which was likely related to having missed your dialysis sessions. It is very important that you are able to keep those appointments to help prevent hospitalization. You were also noted to have some elevated white blood cells, which you have had for a long time and were previously seeing hematology; we recommend that you follow-up with hematology to further address these labs. We also highly recommend that you get an outpatient colonoscopy as you were found to have anemia that required transfusion and there is concern that you have may have some bleeding in your colon. If you have any worsening of your symptoms, or recurrence of the symptoms that brought you to the hospital, please contact our office as soon as you can.    Your follow-up appointment with family medicine is scheduled for 12/13 at [facility], please arrive at least 15 minutes early.

## 2019-12-18 NOTE — Progress Notes (Signed)
Subjective: No dialysis yesterday because of scheduling issues other emergent patient / seen on hemodialysis no complaints/okay per renal discharge after dialysis /discussion with patient needing to go tomorrow for dialysis her normal time she is hesitant, reminded her to need to avoid hospitalization with skipped dialysis again   Objective Vital signs in last 24 hours: Vitals:   12/18/19 0800 12/18/19 0809 12/18/19 0815 12/18/19 0830  BP: (!) 125/58 115/62 (!) 125/59 (!) 101/46  Pulse: 81 81 80   Resp: 14 14  12   Temp: 98.4 F (36.9 C) 98.4 F (36.9 C)    TempSrc: Oral Oral    SpO2: 99% 99%     Weight change:    Physical Exam: General: Alert, NAD Heart:RRR, no MRG Lungs:CTA nonlabored breathing Abdomen:Bowel sounds normoactive, obese soft nontender nondistended,has some abdominal wall edema noted Extremities: Improving bipedal edema to the thighs 1+, right transmit amputation site clean dry Dialysis Access:Positive bruit left upper arm AVF  OP Dialysis Orders: [facility], 3.5 hours, EDW 98.5 kg, Bath two 2K, 2 calcium, UF profile 4, Sensipar 30 q. hemodialysis, Hectorol 11 MCG given q. dialysis, heparin 8000 units q. dialysis, accesses left upper arm AV fistula, no Mircera or iron given  Problem/Plan:\ 1. Volume overload=secondary to missed hemodialysis x2 with anasarca appearance, hemodialysis yesterday on admit , yesterday secondary to scheduling issues, HD today and if discharge OP HD tomorrow to keep on schedule  2. ESRD -HD schedule MWF hemodialysis as above 3. Weakness=probable multifocal = uremia missed dialysis x2 work-up per admit team seen in chair today feeling better 4. Hypertension/volume -volume overload secondary to 2 dialysis treatments missedplan hemotoday as above UF as tolerated 5. Anemia -Hgb 7.5 > 8.6> 8.7, 12/07 admit team ordered 1 unit packed red blood cell /she denies any active bleeding, low Hgb could be dilutional and also missed  ESA as an outpatient/no outpatient Mircera since October 21, 2029, ordered 60 MCG  Aranesp q. Wednesday dialysis missed dialysis yesterday we will give today 6. Metabolic bone disease -Hectorol IV on dialysis,hold vitamin D2/2 corrected calcium over 10 , give,Sensipar ,noted phosphorus 9.4 reflecting her noncompliance with HD and binders ,  last phosphorus  6.9 continue binder 7. Nutrition -renal carb modified Albumin 2.7 > 2.3 give protein supplement 8. Diabetes mellitus type 2 per admit  Ernest Haber, PA-C [facility] [phone] 12/18/2019,9:02 AM  LOS: 2 days   Labs: Basic Metabolic Panel: Recent Labs  Lab 12/16/19 1115 12/17/19 0443 12/18/19 0017  NA 138 137 135  K 5.0 4.1 4.5  CL 93* 96* 95*  CO2 24 28 25   GLUCOSE 251* 203* 156*  BUN 67* 30* 39*  CREATININE 12.56* 7.70* 9.12*  CALCIUM 9.2 9.1 8.8*  PHOS 9.4* 6.9*  --    Liver Function Tests: Recent Labs  Lab 12/17/19 0443  ALBUMIN 2.3*   No results for input(s): LIPASE, AMYLASE in the last 168 hours. No results for input(s): AMMONIA in the last 168 hours. CBC: Recent Labs  Lab 12/15/19 1237 12/16/19 1115 12/16/19 1946 12/17/19 0443 12/18/19 0017  WBC 25.7* 27.3*  --  21.1* 21.3*  NEUTROABS  --  23.5*  --   --   --   HGB 8.9* 7.5* 9.8* 8.6* 8.7*  HCT 30.4* 24.4* 31.2* 28.0* 27.6*  MCV 110.5* 108.4*  --  103.7* 103.0*  PLT 339 342  --  311 297   Cardiac Enzymes: No results for input(s): CKTOTAL, CKMB, CKMBINDEX, TROPONINI in the last 168 hours. CBG:  Recent Labs  Lab 12/17/19 0649 12/17/19 1144 12/17/19 1657 12/17/19 2043 12/18/19 0645  GLUCAP 161* 222* 254* 123* 110*    Studies/Results: No results found. Medications:  . (feeding supplement) PROSource Plus  30 mL Oral BID BM  . sodium chloride   Intravenous Once  . aspirin EC  81 mg Oral Daily  . atorvastatin  80 mg Oral QHS  . cinacalcet  30 mg Oral Q M,W,F-HD  . darbepoetin (ARANESP) injection - DIALYSIS  60 mcg  Intravenous Q Wed-HD  . feeding supplement (NEPRO CARB STEADY)  237 mL Oral BID BM  . ferric citrate  840 mg Oral TID WC  . fluticasone  1 spray Each Nare Daily  . insulin aspart  0-6 Units Subcutaneous TID WC  . lidocaine  1 patch Transdermal Q24H  . loratadine  10 mg Oral Daily  . multivitamin  1 tablet Oral QHS

## 2019-12-18 NOTE — Plan of Care (Signed)

## 2019-12-18 NOTE — Progress Notes (Signed)
NURSING PROGRESS NOTE  KATELAND LEISINGER [id number] Discharge Data: 12/18/2019 1:58 PM Attending Provider: McDiarmid, Blane Ohara, MD GBT:DVVOH, Garen Lah, MD     Early Chars to be D/C'd Home per MD order.  Discussed with the patient the After Visit Summary and all questions fully answered. All IV's discontinued with no bleeding noted. All belongings returned to patient for patient to take home.   Last Vital Signs:  Blood pressure (!) 123/57, pulse 79, temperature 97.8 F (36.6 C), temperature source Oral, resp. rate 16, last menstrual period 09/18/2011, SpO2 98 %.  Discharge Medication List Allergies as of 12/18/2019   No Known Allergies     Medication List    TAKE these medications   acetaminophen 500 MG tablet Commonly known as: TYLENOL Take 1,000 mg by mouth every 6 (six) hours as needed for mild pain or headache.   aspirin EC 81 MG tablet Take 1 tablet (81 mg total) by mouth daily. Notes to patient: 12/19/2019   atorvastatin 80 MG tablet Commonly known as: LIPITOR TAKE 1 TABLET(80 MG) BY MOUTH AT BEDTIME What changed: See the new instructions. Notes to patient: 12/18/2019   cetirizine 10 MG tablet Commonly known as: ZYRTEC Take 10 mg by mouth as needed for allergies.   cyclobenzaprine 10 MG tablet Commonly known as: FLEXERIL TAKE 1 TABLET(10 MG) BY MOUTH THREE TIMES DAILY BETWEEN MEALS AND AT BEDTIME AS NEEDED FOR MUSCLE SPASMS What changed: See the new instructions. Notes to patient: 12/18/2019   ferric citrate 1 GM 210 MG(Fe) tablet Commonly known as: Auryxia Take 2 tablets (420 mg total) by mouth 3 (three) times daily with meals. Give two (420 mg) by mouth three times a day wih meals for ESRD What changed:   how much to take  when to take this  additional instructions Notes to patient: 12/18/2019   fluticasone 50 MCG/ACT nasal spray Commonly known as: FLONASE Place 1 spray into both nostrils daily. What changed:   when to take this  reasons to take  this Notes to patient: 12/19/2019   glucose blood test strip Commonly known as: ONE TOUCH ULTRA TEST 1 each by Other route 3 (three) times daily. ICD-10 code: E11.40. Notes to patient: Continue home schedule    hydrocortisone 2.5 % ointment Apply topically 2 (two) times daily. As needed for mild eczema.  Do not use for more than 1-2 weeks at a time. What changed:   how much to take  when to take this  reasons to take this  additional instructions   hydrocortisone-pramoxine 1-2.5 % Lotn Commonly known as: PRAMOSONE Apply to affected area twice a day as needed What changed:   how much to take  how to take this  when to take this  additional instructions   hydrOXYzine 25 MG tablet Commonly known as: ATARAX/VISTARIL Take 25 mg by mouth 2 (two) times daily as needed for anxiety.   Insulin Pen Needle 29G X 12MM Misc Inject 1 pen into the skin 2 (two) times daily. Check blood sugar daily. Notes to patient: Continue home schedule    B-D UF III MINI PEN NEEDLES 31G X 5 MM Misc Generic drug: Insulin Pen Needle USE AS DIRECTED TWICE DAILY Notes to patient: Continue home schedule    Insulin Syringe-Needle U-100 30G X 5/16" 1 ML Misc Commonly known as: UltiCare Insulin Syringe USE AS DIRECTED Notes to patient: Continue home schedule    MIRCERA IJ Mircera   multivitamin Tabs tablet Take 1 tablet by mouth daily.  Notes to patient: 12/19/2019   ONE TOUCH ULTRA 2 w/Device Kit 1 kit by Does not apply route 3 (three) times daily. ICD-10 code: E11.40 Notes to patient: Continue home schedule    OneTouch Delica Lancets 13Y Misc 1 Device by Does not apply route as needed (to check blood glucose).   PARSABIV IV Inject into the vein. Notes to patient: Continue home schedule    Tresiba FlexTouch 100 UNIT/ML FlexTouch Pen Generic drug: insulin degludec Inject 50 Units into the skin in the morning. Notes to patient: 12/19/2019

## 2019-12-21 LAB — CULTURE, BLOOD (ROUTINE X 2)
Culture: NO GROWTH
Culture: NO GROWTH
Special Requests: ADEQUATE
Special Requests: ADEQUATE

## 2019-12-22 ENCOUNTER — Ambulatory Visit: Payer: Medicare Other

## 2019-12-26 ENCOUNTER — Other Ambulatory Visit: Payer: Self-pay | Admitting: Family Medicine

## 2019-12-31 NOTE — Progress Notes (Signed)
    SUBJECTIVE:   CHIEF COMPLAINT / HPI:   Hospital follow-up: Patient states she is breathing much better now.  The swelling is much better now that she is going to dialysis regularly.  She has not missed any dialysis days since her discharge from the hospital.  Vaginal bleeding/anemia: Patient states that she has not had any vaginal bleeding since our last encounter.  She would still like a referral to OB/GYN to see if there is anything else to be done.  Patient also interested in doing a colonoscopy which she has never received before.  Patient states she recently had her hemoglobin checked at dialysis and states "it was good".  She declines hemoglobin testing today.  Rash: Patient states that the rash on her legs is still itching.  The medication we gave her last time helped slightly but not enough.  She is willing to undergo a biopsy today.  She would like a different steroid cream to try on her legs.  PERTINENT  PMH / PSH: ESRD, postmenopausal bleeding  OBJECTIVE:   BP 118/80   Pulse 94   Ht 5\' 4"  (1.626 m)   Wt 216 lb 9.6 oz (98.2 kg)   LMP 09/18/2011   SpO2 94%   BMI 37.18 kg/m   General: Alert and oriented.  No acute distress. CV: Regular rate and rhythm, no murmurs Pulmonary: Lungs clear auscultation bilaterally Skin: Multiple pruritic hyperpigmented papular lesions on the legs bilaterally.  Punch biopsy was performed on the left anterior lateral leg.    ASSESSMENT/PLAN:   Papular rash Rash still present.  Patient still complaining of pruritus.  Biopsy performed with 3 mm punch.  Sample sent to pathology for review.  Prescribed patient triamcinolone 0.1% to apply twice daily over the next 2 weeks until we get the results of the biopsy back.  Previous hydrocortisone 2.5% did not help significantly.  Still uncertain of what this papular lesion could be.  Possibly nummular eczema.  Post-menopausal bleeding Patient not currently have any bleeding.  She is willing to see a  gynecologist to see if there is anything else that needs to be done, but endometrial biopsy was negative.  Will put in referral to OB/GYN.  Patient declined a CBC today, stating that her hemoglobin was good when it was recently tested at dialysis.  ESRD (end stage renal disease) on dialysis [facility]) Patient has not missed any dialysis sessions since her hospitalization discharge.  Pitting edema and dyspnea has resolved.  Gets electrolytes checked regularly at dialysis, no labs today.  Follow-up at next visit.     Benay Pike, MD [place]

## 2020-01-01 ENCOUNTER — Other Ambulatory Visit: Payer: Self-pay

## 2020-01-01 ENCOUNTER — Other Ambulatory Visit: Payer: Self-pay | Admitting: Family Medicine

## 2020-01-01 ENCOUNTER — Ambulatory Visit (INDEPENDENT_AMBULATORY_CARE_PROVIDER_SITE_OTHER): Payer: Medicare Other | Admitting: Family Medicine

## 2020-01-01 VITALS — BP 118/80 | HR 94 | Ht 64.0 in | Wt 216.6 lb

## 2020-01-01 DIAGNOSIS — Z1211 Encounter for screening for malignant neoplasm of colon: Secondary | ICD-10-CM | POA: Diagnosis not present

## 2020-01-01 DIAGNOSIS — Z992 Dependence on renal dialysis: Secondary | ICD-10-CM

## 2020-01-01 DIAGNOSIS — N186 End stage renal disease: Secondary | ICD-10-CM | POA: Diagnosis not present

## 2020-01-01 DIAGNOSIS — D72829 Elevated white blood cell count, unspecified: Secondary | ICD-10-CM

## 2020-01-01 DIAGNOSIS — R21 Rash and other nonspecific skin eruption: Secondary | ICD-10-CM | POA: Diagnosis present

## 2020-01-01 DIAGNOSIS — N950 Postmenopausal bleeding: Secondary | ICD-10-CM

## 2020-01-01 DIAGNOSIS — Z1212 Encounter for screening for malignant neoplasm of rectum: Secondary | ICD-10-CM | POA: Diagnosis not present

## 2020-01-01 MED ORDER — TRIAMCINOLONE ACETONIDE 0.1 % EX OINT: 1.0000 "application " | TOPICAL_OINTMENT | Freq: Two times a day (BID) | CUTANEOUS | 1 refills | Status: DC

## 2020-01-01 NOTE — Assessment & Plan Note (Addendum)
Patient not currently have any bleeding.  She is willing to see a gynecologist to see if there is anything else that needs to be done, but endometrial biopsy was negative.  Will put in referral to OB/GYN.  Patient declined a CBC today, stating that her hemoglobin was good when it was recently tested at dialysis.

## 2020-01-01 NOTE — Patient Instructions (Signed)
It was nice to see you today,  I will put in the referrals for your colonoscopy and OB/GYN appointments.  I would like to get some blood work to check your anemia status and I will discuss the results of this with you when I talk to you about the biopsy results.  The soles of the biopsy of your leg will come back in approximately 1 to 2 weeks. I will call you when I get the results.  In the meantime I would like you to use the stronger steroid cream that I have prescribed for you. You can use it twice a day every day for 2 weeks before I would like you to stop. If it is not helping you after the first week, please let us know.  Have a great day,  Clemetine Marker, MD

## 2020-01-01 NOTE — Assessment & Plan Note (Signed)
Rash still present.  Patient still complaining of pruritus.  Biopsy performed with 3 mm punch.  Sample sent to pathology for review.  Prescribed patient triamcinolone 0.1% to apply twice daily over the next 2 weeks until we get the results of the biopsy back.  Previous hydrocortisone 2.5% did not help significantly.  Still uncertain of what this papular lesion could be.  Possibly nummular eczema.

## 2020-01-01 NOTE — Assessment & Plan Note (Signed)
Patient has not missed any dialysis sessions since her hospitalization discharge.  Pitting edema and dyspnea has resolved.  Gets electrolytes checked regularly at dialysis, no labs today.  Follow-up at next visit.

## 2020-01-05 ENCOUNTER — Telehealth: Payer: Self-pay

## 2020-01-05 NOTE — Progress Notes (Signed)
    SUBJECTIVE:   CHIEF COMPLAINT / HPI: f/u papular rash   Papular rash: patient has had a papular rash on her bilateral lower extremities for several weeks. Patient had a punch biopsy done on 12/23, no results yet. She was treated with triamcinolone cr 0.1% for itching, which has not helped. She has zyrtec at home and receives benadryl with HD, which does help with itching. Papular rash is stable presently, some pain associated.  Right upper thigh rash: Patient has had hyperpigmentation and dryness on her left upper thigh for many months. She reports that in the last week or so it has become much more bothersome and irritated, with occasional burning sensation and intense itching. The skin feels thicker in the areas of hyperpigmentation (see picture).   PERTINENT  PMH / PSH: HD, papular rash  OBJECTIVE:   BP (!) 148/62   Pulse 92 Comment: nails would not read  Ht 5\' 4"  (1.626 m)   Wt 223 lb 3.2 oz (101.2 kg)   LMP 09/18/2011   BMI 38.31 kg/m   Nursing note and vitals reviewed GEN: age-appropriate AAW, resting comfortably in chair, NAD, obese HEENT: NCAT. Sclera without injection or icterus. MMM.  Neuro: Alert and at baseline Ext: no edema Skin: 3-33mm hyperpigmented papular rash diffusely spread from knees to ankles on bilateral lower extremities; diffuse, large area of hyperpigmented patches along medial and lateral sides of upper thighs, 3cm patch of erythema and edema in medial right upper thigh with signs of excoriation Psych: Pleasant and appropriate     ASSESSMENT/PLAN:   Papular rash Patient having continued pruritus of papular rash on b/l LEs. Still awaiting results of punch biopsy, so far triamcinolone 0.1% not effective for itching. Will try triamcinolone 0.5% oint for itching, patient may also use zyrtec 10 mg qd prn. Follow up if no improvement or worsening of symptoms, counseled on appropriate use of mid-high potency steroid, see AVS.. Unclear etiology of rash, with no  change in personal care products, suspect HD skin eruption, which can have a wide variety of presentations.  Xerosis of skin Patient has history of chronic hyperpigmentation and dryness of upper thighs, most likely xerotic in nature, suspect HD as etiology since this is a known common skin finding in HD patients. However, patient has an area of irritation that could be a superimposed intertriginous infection due to location and itching. Will treat with nystatin ointment BID and assess for improvement. Return precautions given, see AVS.      Gladys Damme, MD [place]

## 2020-01-05 NOTE — Telephone Encounter (Signed)
Patient calls nurse line requesting different medication for itching on legs. Patient reports using the triamcinolone ointment as prescribed, with little relief. Patient is also requesting results of biopsy. Informed patient that results have not came back yet.   Please advise additional steps for persistent pruritus.   Talbot Grumbling, RN

## 2020-01-06 ENCOUNTER — Other Ambulatory Visit: Payer: Self-pay

## 2020-01-06 ENCOUNTER — Ambulatory Visit (INDEPENDENT_AMBULATORY_CARE_PROVIDER_SITE_OTHER): Payer: Medicare Other | Admitting: Family Medicine

## 2020-01-06 VITALS — BP 148/62 | HR 92 | Ht 64.0 in | Wt 223.2 lb

## 2020-01-06 DIAGNOSIS — L304 Erythema intertrigo: Secondary | ICD-10-CM

## 2020-01-06 DIAGNOSIS — R21 Rash and other nonspecific skin eruption: Secondary | ICD-10-CM | POA: Diagnosis not present

## 2020-01-06 DIAGNOSIS — L853 Xerosis cutis: Secondary | ICD-10-CM | POA: Insufficient documentation

## 2020-01-06 MED ORDER — NYSTATIN 100000 UNIT/GM EX OINT: 1.0000 "application " | TOPICAL_OINTMENT | Freq: Two times a day (BID) | CUTANEOUS | 0 refills | Status: DC

## 2020-01-06 MED ORDER — TRIAMCINOLONE ACETONIDE 0.5 % EX OINT: 1.0000 "application " | TOPICAL_OINTMENT | Freq: Two times a day (BID) | CUTANEOUS | 0 refills | Status: DC

## 2020-01-06 NOTE — Assessment & Plan Note (Signed)
Patient has history of chronic hyperpigmentation and dryness of upper thighs, most likely xerotic in nature, suspect HD as etiology since this is a known common skin finding in HD patients. However, patient has an area of irritation that could be a superimposed intertriginous infection due to location and itching. Will treat with nystatin ointment BID and assess for improvement. Return precautions given, see AVS.

## 2020-01-06 NOTE — Patient Instructions (Addendum)
It was a pleasure to see you today!  1. For the rash on your thighs. I believe you have a rash caused by a yeast. I recommend you treat it with nystatin ointment, you can use this 2x per day until healed. If you are not better in 7-10 days, please let us know at [phone].   2. For the bumps on your legs: most likely the biopsy results will return either next week or the week after, we will call you when we have the results. Try using triamcinolone ointment 0.5% on the bumps. Please do not put this ointment on your thighs, genitals, or face. You can use this up to twice a day for 2 weeks. This is a much stronger medication, which is why you should only use it on your lower legs where the bumps are, and only for 2 weeks.   3. For pain and itching: hopefully this will improve with the ointments above, but you can also use zyrtec (not sleepy) or benadryl (can make you sleepy) for itching, as well as tylenol for pain. If you notice spreading redness, heat, oozing pus, please call and let us know and stop using the above medicaitons [phone].  Be Well,  Dr. Chauncey Reading

## 2020-01-06 NOTE — Assessment & Plan Note (Signed)
Patient having continued pruritus of papular rash on b/l LEs. Still awaiting results of punch biopsy, so far triamcinolone 0.1% not effective for itching. Will try triamcinolone 0.5% oint for itching, patient may also use zyrtec 10 mg qd prn. Follow up if no improvement or worsening of symptoms, counseled on appropriate use of mid-high potency steroid, see AVS.. Unclear etiology of rash, with no change in personal care products, suspect HD skin eruption, which can have a wide variety of presentations.

## 2020-01-08 ENCOUNTER — Other Ambulatory Visit: Payer: Self-pay | Admitting: Family Medicine

## 2020-01-08 MED ORDER — DOXEPIN HCL 5 % EX CREA: TOPICAL_CREAM | CUTANEOUS | 1 refills | Status: AC

## 2020-01-08 NOTE — Telephone Encounter (Signed)
Sent in Rx for topical cream that pt may use for up to 8 days while we await pathology results.

## 2020-01-13 ENCOUNTER — Telehealth: Payer: Self-pay

## 2020-01-13 NOTE — Telephone Encounter (Signed)
Patient calls nurse line requesting derm path results. Patient reports the Nystatin is not working well and she would like something else. I see where PCP called in Doxepin on 12/30, patient reports the pharmacy called her, however she did not go and pick up. Patient advised to try Doxepin. Please advise on results.

## 2020-01-13 NOTE — Telephone Encounter (Signed)
Patient calls back. Patient reports Doxepin is 600 dollars with a good rx coupon. Patient reports her insurance does not kick in until 01/24/2020. Unfortunately, there is not anything I can do on my end to lower the price until she has active insurance. Patient is requesting "something" cheaper until 01/24/2020.

## 2020-01-14 ENCOUNTER — Telehealth: Payer: Self-pay | Admitting: Family Medicine

## 2020-01-14 NOTE — Telephone Encounter (Signed)
Patient LVM on nurse line x 2 regarding request for different medication that would be more affordable until she has insurance coverage.   Talbot Grumbling, RN

## 2020-01-15 ENCOUNTER — Ambulatory Visit (INDEPENDENT_AMBULATORY_CARE_PROVIDER_SITE_OTHER): Payer: Medicare Other | Admitting: Family Medicine

## 2020-01-15 ENCOUNTER — Other Ambulatory Visit: Payer: Self-pay

## 2020-01-15 VITALS — BP 132/75 | HR 90

## 2020-01-15 DIAGNOSIS — B888 Other specified infestations: Secondary | ICD-10-CM | POA: Diagnosis not present

## 2020-01-15 DIAGNOSIS — R21 Rash and other nonspecific skin eruption: Secondary | ICD-10-CM | POA: Diagnosis present

## 2020-01-15 MED ORDER — HYDROXYZINE HCL 10 MG PO TABS: 10.0000 mg | ORAL_TABLET | Freq: Every day | ORAL | 0 refills | Status: AC | PRN

## 2020-01-15 MED ORDER — TRIAMCINOLONE ACETONIDE 0.5 % EX OINT: 1.0000 "application " | TOPICAL_OINTMENT | Freq: Two times a day (BID) | CUTANEOUS | 1 refills | Status: DC

## 2020-01-15 NOTE — Progress Notes (Signed)
    SUBJECTIVE:   CHIEF COMPLAINT / HPI: F/u rash/ need cream   Rachel Chaney is a 60 year old female presenting for follow-up of a right thigh and left lower leg rash.  She has been following with Dr. Jeannine Kitten for this concern, however due to significant pruritus and is requesting an acute relief cream for today.  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  She has trialed several different steroid creams which helped somewhat but still has intermittent itching in between.  Has been itching both her upper thigh and legs, however trying to avoid as much as possible.  She was started on nystatin on 12/28 to cover for any concurrent fungal intertrigo however she noted no difference with this and discontinued.  Dr. Jeannine Kitten has recently tried to do doxepin cream however she needed prior authorization to potentially have this medication covered.  Today, she endorses the rash has not changed in appearance or spreading.  Still very itchy.   PERTINENT  PMH / PSH: ESRD, OSA, paroxysmal atrial fibrillation, papular rash, T2DM  OBJECTIVE:   BP 132/75   Pulse 90   LMP 09/18/2011   SpO2 92%    General: Alert, NAD HEENT: NCAT, MMM Lungs: No increased WOB  Ext: Warm, dry, 2+ distal pulses Skin:  Left lower leg: Scattered hyperpigmented papular lesions without surrounding erythema or warmth to touch.  Picture below. Right upper thigh: Notable lichenification and hyperpigmentation diffusely across right upper thigh with tenderness to palpation, no erythema or warmth to touch.  Picture below.  After exam upon cleaning room, 1 bedbug was found on the sheet that she was using.        ASSESSMENT/PLAN:   Papular rash Biopsy returning as lichen simplex chronicus in the setting of significant pruritus.  Previously unclear etiology and felt to be a HD skin eruption, however now top differential considering bedbug rash in the setting of  finding a bedbug and appearance.  Discussed avoidance of scratching in this area, Rx'd triamcinolone 0.5% BID PRN, additionally use daily emollient.  Additionally Rx'd low-dose hydroxyzine 10 mg daily PRN (renally dosed) for significant pruritus if needed.  Rash and nonspecific skin eruption Unclear if rash on right thigh is also of similar etiology, however appears like postinflammatory hyperpigmentation with significant lichenification.  Rx triamcinolone/hydroxyzine as discussed above to help break itching cycle.  Infestation by bed bug Found bedbug on sheet patient used shortly after she left.  Certainly do question if this is contributing to above complaints.  Called patient to inform of finding, she reports that she doubts that these are present at her house but will take a look.  I encouraged a professional home evaluation pending her findings.    Recommended following up with Dr. Jeannine Kitten in the next 2 weeks to discuss more chronic topical therapy, could consider calcineurin inhibitor for improved pruritus relief.  Rachel Chaney, [place]

## 2020-01-15 NOTE — Assessment & Plan Note (Signed)
Unclear if rash on right thigh is also of similar etiology, however appears like postinflammatory hyperpigmentation with significant lichenification.  Rx triamcinolone/hydroxyzine as discussed above to help break itching cycle.

## 2020-01-15 NOTE — Patient Instructions (Signed)
It was wonderful to see you today.  It is that you avoid itching these areas to allow the area to heal.  I am going to send in a steroid cream for you to use to help with itch.  I will also send an oral medication that you can use very sparingly if itch is significant.  Please follow-up in the next 1-2 weeks to see Dr. Jeannine Kitten to discuss a long-term therapy for your skin.

## 2020-01-15 NOTE — Assessment & Plan Note (Addendum)
Found bedbug on sheet patient used shortly after she left.  Certainly do question if this is contributing to above complaints.  Called patient to inform of finding, she reports that she doubts that these are present at her house but will take a look.  I encouraged a professional home evaluation pending her findings.

## 2020-01-15 NOTE — Assessment & Plan Note (Signed)
Biopsy returning as lichen simplex chronicus in the setting of significant pruritus.  Previously unclear etiology and felt to be a HD skin eruption, however now top differential considering bedbug rash in the setting of finding a bedbug and appearance.  Discussed avoidance of scratching in this area, Rx'd triamcinolone 0.5% BID PRN, additionally use daily emollient.  Additionally Rx'd low-dose hydroxyzine 10 mg daily PRN (renally dosed) for significant pruritus if needed.

## 2020-01-20 ENCOUNTER — Other Ambulatory Visit: Payer: Self-pay | Admitting: Family Medicine

## 2020-01-20 DIAGNOSIS — L304 Erythema intertrigo: Secondary | ICD-10-CM

## 2020-01-21 NOTE — Telephone Encounter (Signed)
Received fax from pharmacy, PA needed on Doxepin. Clinical questions submitted via Cover My Meds. Waiting on response, could take up to 72 hours.  Cover My Meds info: Key: [id number]

## 2020-01-22 NOTE — Telephone Encounter (Signed)
Medication has been approved. A VM was left for the pharmacy informing.

## 2020-01-28 ENCOUNTER — Other Ambulatory Visit: Payer: Self-pay

## 2020-01-28 ENCOUNTER — Ambulatory Visit (INDEPENDENT_AMBULATORY_CARE_PROVIDER_SITE_OTHER): Payer: Medicare Other | Admitting: Family Medicine

## 2020-01-28 DIAGNOSIS — J9612 Chronic respiratory failure with hypercapnia: Secondary | ICD-10-CM | POA: Diagnosis not present

## 2020-01-28 DIAGNOSIS — H538 Other visual disturbances: Secondary | ICD-10-CM | POA: Insufficient documentation

## 2020-01-28 DIAGNOSIS — R21 Rash and other nonspecific skin eruption: Secondary | ICD-10-CM

## 2020-01-28 HISTORY — DX: Other visual disturbances: H53.8

## 2020-01-28 MED ORDER — TRAMADOL HCL 50 MG PO TABS: 50.0000 mg | ORAL_TABLET | Freq: Two times a day (BID) | ORAL | 0 refills | Status: AC | PRN

## 2020-01-28 NOTE — Assessment & Plan Note (Signed)
Decreased vision, unsure of baseline but patient reports has decreased in last few weeks.  She is neurologically intact on exam.  No VF deficits.  Less likely a stroke, especially given chronicity.  She has an appointment in the next few weeks with ophthalmology, so advised her to f/u with them for this.  Also recommend avoiding hypoglycemia as this tends to make it worse.  F/U with PCP in 1-2 weeks.  RTC if worsening, other change in vision, change in hearing, slurred speech, focal weakness, N/T, facial droop.  She voiced understanding.

## 2020-01-28 NOTE — Assessment & Plan Note (Signed)
Patient seen and examined by both Dr. Owens Shark and Dr. Gwendlyn Deutscher.  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 She was made aware to discuss this with her nephrologist when she goes back to HD in two days.  Also called to Her HD center, [facility], and spoke with RN Obas, who states that she will make a note of this to make sure the PA is aware. It is possible that she needs a change in her dialysate to help with this. Discussed with Dr. Owens Shark, okay to start her on tramadol, renally adjusted we'll start with 50 mg twice daily as needed. Advised her to use this for severe pain. We'll have her follow-up with her PCP in 1 to 2 weeks to ensure that all of this is taking care of.

## 2020-01-28 NOTE — Patient Instructions (Signed)
Thank you for coming to see me today. It was a pleasure. Today we talked about:   We will get some labs today.  If they are abnormal or we need to do something about them, I will call you.  If they are normal, I will send you a message on MyChart (if it is active) or a letter in the mail.  If you don't hear from [place] in 2 weeks, please call the office at the number below.  We have sent a prescription for pain medication to the pharmacy that you can take every 12 hrs as needed for your pain.  I will call your dialysis center to let them know about your legs.  Make sure you also mention it when you go.  You should call your eye doctor right away for an appointment to check your vision.  If your symptoms worsen, you have difficulty with speech, balance, hearing, weakness on one side or the other, facial droop, or new numbness and tingling, you should go to the emergency room right away.  Please follow-up with PCP in 1-2 weeks.  If you have any questions or concerns, please do not hesitate to call the office at [phone].  Best,   [place] Constable, DO

## 2020-01-28 NOTE — Assessment & Plan Note (Signed)
Patient initially hypoxic in clinic to 71s, placed on oxygen. Patient reports that she did not bring her oxygen with her today.  On 2L per [place] at home.  Placed on 2L in clinic and improved to 99%.  Patient's husband brought O2 to clinic to place her on before she was discharged home.  Advised patient to wear her oxygen.

## 2020-01-28 NOTE — Progress Notes (Signed)
SUBJECTIVE:   CHIEF COMPLAINT / HPI:   Blurry Vision  1-2 weeks Comes and goes, both eyes, all of her vision Goes to Dr. [facility], had surgery for diabetic retinopathy Last seen last year Thinks she is due for another check No pain, flashes, curtains over vision Was unable to read Snellen Thinks her vision is a little blurry right now No headaches, dizziness Feels more unsteady than usual  CBGs were low this morning, vision is worse when it is low When sugars come back up, her vision gets better No focal weakness or N/T No LOC, recent head trauma No slurred speech or facial droop No recent confusion  Hypoxia Patient's SpO2 74% in office, on repeat consistently in 70s She is on 2L per [place] at home, but didn't bring with her today Denies shortness of breath  Leg Pain and Rash Right >Left Started 1-2 weeks ago Has continued to worsen Has been using creams prescribed by PCP for another area of her legs with rash that has not improved Pain is very severe and tender to touch States that it worsens with dialysis and sometimes feels like a burning pain   PERTINENT  PMH / PSH: ESRD on HD, T2DM, HTN, PAF, OSA, Hypercapnic respiratory failure   OBJECTIVE:   BP 110/70   Pulse 92   Ht 5\' 4"  (1.626 m)   LMP 09/18/2011   SpO2 99% Comment: with oxygen  BMI 38.31 kg/m    Physical Exam:  General: 60 y.o. female in NAD, sitting in wheelchair HEENT: NCAT, PERRL, EOMI, counts fingers at two feet, Visual field intact Cardio: RRR no m/r/g Lungs: CTAB, no wheezing, no rhonchi, no crackles, no IWOB on 2L per [place] Skin: warm and dry, multiple hyperpigmented papules on lower legs bilaterally previously noted, right thigh with hyperpigmented patches along lateral thigh coarsing to medial thigh without overlying lesions, does have few excoriations, no erythema, TTP and very dense and indurated on palpation, she has a small left medial thigh hyperpigmented, indurated and dense area with TTP,  see images below Neuro: CN II-XII grossly intact, sensation intact throughout, BUE/BLE 4/5 strength           ASSESSMENT/PLAN:   Calciphylaxis Patient seen and examined by both Dr. Owens Shark and Dr. Gwendlyn Deutscher.  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 She was made aware to discuss this with her nephrologist when she goes back to HD in two days.  Also called to Her HD center, [place] [place], and spoke with RN Obas, who states that she will make a note of this to make sure the PA is aware. It is possible that she needs a change in her dialysate to help with this. Discussed with Dr. Owens Shark, okay to start her on tramadol, renally adjusted we'll start with 50 mg twice daily as needed. Advised her to use this for severe pain. We'll have her follow-up with her PCP in 1 to 2 weeks to ensure that all of this is taking care of.  Hypercapnic respiratory failure ([place]) Patient initially hypoxic in clinic to 70s, placed on oxygen. Patient reports that she did not bring her oxygen with her today.  On 2L per [place] at home.  Placed on 2L in clinic and improved to 99%.  Patient's husband brought O2 to clinic to place her on before she was discharged home.  Advised patient to wear her oxygen.  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  On triamcinolone and doxepin cream  per Dr. Jeannine Kitten.  Will obtain Zinc to r/o deficiency.  F/U PCP in 1-2 weeks.  Blurred vision Decreased vision, unsure of baseline but patient reports has decreased in last few weeks.  She is neurologically intact on exam.  No VF deficits.  Less likely a stroke, especially given chronicity.  She has an appointment in the next few weeks with ophthalmology, so advised her to f/u with them for this.  Also recommend avoiding hypoglycemia as this tends to make it worse.  F/U with PCP in 1-2 weeks.  RTC if worsening, other change in vision, change in hearing, slurred speech, focal weakness, N/T, facial droop.  She  voiced understanding.     Cleophas Dunker, [place]

## 2020-01-28 NOTE — Assessment & Plan Note (Signed)
Prior biopsy with lichen simplex chronicus.  On triamcinolone and doxepin cream per Dr. Jeannine Kitten.  Will obtain Zinc to r/o deficiency.  F/U PCP in 1-2 weeks.

## 2020-01-29 ENCOUNTER — Ambulatory Visit: Payer: Medicare Other

## 2020-01-31 ENCOUNTER — Emergency Department (HOSPITAL_COMMUNITY): Payer: Medicare Other

## 2020-01-31 ENCOUNTER — Observation Stay (HOSPITAL_COMMUNITY)
Admission: EM | Admit: 2020-01-31 | Discharge: 2020-02-01 | Disposition: A | Discharge status: Home / Self Care | Payer: Medicare Other | Attending: Family Medicine | Admitting: Family Medicine

## 2020-01-31 DIAGNOSIS — Z992 Dependence on renal dialysis: Secondary | ICD-10-CM | POA: Diagnosis not present

## 2020-01-31 DIAGNOSIS — I5030 Unspecified diastolic (congestive) heart failure: Secondary | ICD-10-CM | POA: Insufficient documentation

## 2020-01-31 DIAGNOSIS — K8020 Calculus of gallbladder without cholecystitis without obstruction: Secondary | ICD-10-CM | POA: Diagnosis not present

## 2020-01-31 DIAGNOSIS — E162 Hypoglycemia, unspecified: Secondary | ICD-10-CM | POA: Diagnosis present

## 2020-01-31 DIAGNOSIS — E1122 Type 2 diabetes mellitus with diabetic chronic kidney disease: Secondary | ICD-10-CM | POA: Diagnosis not present

## 2020-01-31 DIAGNOSIS — R7989 Other specified abnormal findings of blood chemistry: Secondary | ICD-10-CM | POA: Diagnosis not present

## 2020-01-31 DIAGNOSIS — E11649 Type 2 diabetes mellitus with hypoglycemia without coma: Secondary | ICD-10-CM | POA: Insufficient documentation

## 2020-01-31 DIAGNOSIS — J189 Pneumonia, unspecified organism: Secondary | ICD-10-CM | POA: Diagnosis not present

## 2020-01-31 DIAGNOSIS — U071 COVID-19: Secondary | ICD-10-CM

## 2020-01-31 DIAGNOSIS — Z794 Long term (current) use of insulin: Secondary | ICD-10-CM | POA: Diagnosis not present

## 2020-01-31 DIAGNOSIS — N186 End stage renal disease: Secondary | ICD-10-CM | POA: Diagnosis not present

## 2020-01-31 DIAGNOSIS — I132 Hypertensive heart and chronic kidney disease with heart failure and with stage 5 chronic kidney disease, or end stage renal disease: Secondary | ICD-10-CM | POA: Diagnosis not present

## 2020-01-31 DIAGNOSIS — Z79899 Other long term (current) drug therapy: Secondary | ICD-10-CM | POA: Insufficient documentation

## 2020-01-31 DIAGNOSIS — Z7982 Long term (current) use of aspirin: Secondary | ICD-10-CM | POA: Diagnosis not present

## 2020-01-31 DIAGNOSIS — R2681 Unsteadiness on feet: Secondary | ICD-10-CM | POA: Diagnosis not present

## 2020-01-31 DIAGNOSIS — R778 Other specified abnormalities of plasma proteins: Secondary | ICD-10-CM | POA: Insufficient documentation

## 2020-01-31 HISTORY — DX: COVID-19: U07.1

## 2020-01-31 LAB — CBG MONITORING, ED
Glucose-Capillary: 33 mg/dL — CL (ref 70–99)
Glucose-Capillary: 44 mg/dL — CL (ref 70–99)
Glucose-Capillary: 156 mg/dL — ABNORMAL HIGH (ref 70–99)
Glucose-Capillary: 129 mg/dL — ABNORMAL HIGH (ref 70–99)
Glucose-Capillary: 104 mg/dL — ABNORMAL HIGH (ref 70–99)
Glucose-Capillary: 61 mg/dL — ABNORMAL LOW (ref 70–99)
Glucose-Capillary: 70 mg/dL (ref 70–99)
Glucose-Capillary: 75 mg/dL (ref 70–99)

## 2020-01-31 LAB — COMPREHENSIVE METABOLIC PANEL
ALT: 23 U/L (ref 0–44)
AST: 27 U/L (ref 15–41)
Albumin: 2.6 g/dL — ABNORMAL LOW (ref 3.5–5.0)
Alkaline Phosphatase: 60 U/L (ref 38–126)
Anion gap: 14 (ref 5–15)
BUN: 21 mg/dL (ref 8–23)
CO2: 30 mmol/L (ref 22–32)
Calcium: 8.3 mg/dL — ABNORMAL LOW (ref 8.9–10.3)
Chloride: 91 mmol/L — ABNORMAL LOW (ref 98–111)
Creatinine, Ser: 5.82 mg/dL — ABNORMAL HIGH (ref 0.44–1.00)
GFR, Estimated: 8 mL/min — ABNORMAL LOW (ref >60)
Glucose, Bld: 192 mg/dL — ABNORMAL HIGH (ref 70–99)
Potassium: 3.7 mmol/L (ref 3.5–5.1)
Sodium: 135 mmol/L (ref 135–145)
Total Bilirubin: 1.1 mg/dL (ref 0.3–1.2)
Total Protein: 5.8 g/dL — ABNORMAL LOW (ref 6.5–8.1)

## 2020-01-31 LAB — ZINC: Zinc: 51 ug/dL (ref 44–115)

## 2020-01-31 LAB — CBC WITH DIFFERENTIAL/PLATELET
Abs Immature Granulocytes: 0.04 10*3/uL (ref 0.00–0.07)
Basophils Absolute: 0 10*3/uL (ref 0.0–0.1)
Basophils Relative: 0 %
Eosinophils Absolute: 0.1 10*3/uL (ref 0.0–0.5)
Eosinophils Relative: 1 %
HCT: 35.2 % — ABNORMAL LOW (ref 36.0–46.0)
Hemoglobin: 10.3 g/dL — ABNORMAL LOW (ref 12.0–15.0)
Immature Granulocytes: 1 %
Lymphocytes Relative: 6 %
Lymphs Abs: 0.5 10*3/uL — ABNORMAL LOW (ref 0.7–4.0)
MCH: 31.3 pg (ref 26.0–34.0)
MCHC: 29.3 g/dL — ABNORMAL LOW (ref 30.0–36.0)
MCV: 107 fL — ABNORMAL HIGH (ref 80.0–100.0)
Monocytes Absolute: 0.4 10*3/uL (ref 0.1–1.0)
Monocytes Relative: 6 %
Neutro Abs: 6.5 10*3/uL (ref 1.7–7.7)
Neutrophils Relative %: 86 %
Platelets: 153 10*3/uL (ref 150–400)
RBC: 3.29 MIL/uL — ABNORMAL LOW (ref 3.87–5.11)
RDW: 17.4 % — ABNORMAL HIGH (ref 11.5–15.5)
WBC: 7.5 10*3/uL (ref 4.0–10.5)
nRBC: 0.3 % — ABNORMAL HIGH (ref 0.0–0.2)

## 2020-01-31 LAB — PROCALCITONIN: Procalcitonin: 1.61 ng/mL

## 2020-01-31 LAB — SARS CORONAVIRUS 2 BY RT PCR (HOSPITAL ORDER, PERFORMED IN ~~LOC~~ HOSPITAL LAB): SARS Coronavirus 2: POSITIVE — AB

## 2020-01-31 LAB — TROPONIN I (HIGH SENSITIVITY)
Troponin I (High Sensitivity): 267 ng/L (ref <18)
Troponin I (High Sensitivity): 272 ng/L (ref <18)
Troponin I (High Sensitivity): 272 ng/L (ref <18)
Troponin I (High Sensitivity): 299 ng/L (ref <18)

## 2020-01-31 LAB — LIPASE, BLOOD: Lipase: 19 U/L (ref 11–51)

## 2020-01-31 LAB — LACTATE DEHYDROGENASE: LDH: 208 U/L — ABNORMAL HIGH (ref 98–192)

## 2020-01-31 MED ORDER — IOHEXOL 350 MG/ML SOLN
100.0000 mL | Freq: Once | INTRAVENOUS | Status: AC | PRN
  Administered 2020-01-31: 100 mL via INTRAVENOUS

## 2020-01-31 MED ORDER — FERRIC CITRATE 1 GM 210 MG(FE) PO TABS
420.0000 mg | ORAL_TABLET | Freq: Three times a day (TID) | ORAL | Status: DC
Start: 2020-01-31 — End: 2020-02-01
  Administered 2020-01-31 – 2020-02-01 (×5): 420 mg via ORAL
  Filled 2020-01-31 (×8): qty 2

## 2020-01-31 MED ORDER — CYCLOBENZAPRINE HCL 10 MG PO TABS: 10.0000 mg | ORAL_TABLET | Freq: Three times a day (TID) | ORAL | Status: DC | PRN

## 2020-01-31 MED ORDER — SODIUM CHLORIDE 0.9 % IV SOLN
500.0000 mg | Freq: Once | INTRAVENOUS | Status: AC
  Administered 2020-01-31: 500 mg via INTRAVENOUS
  Filled 2020-01-31: qty 500

## 2020-01-31 MED ORDER — HEPARIN SODIUM (PORCINE) 5000 UNIT/ML IJ SOLN
5000.0000 [IU] | Freq: Three times a day (TID) | INTRAMUSCULAR | Status: DC
  Administered 2020-01-31 – 2020-02-01 (×4): 5000 [IU] via SUBCUTANEOUS
  Filled 2020-01-31 (×4): qty 1

## 2020-01-31 MED ORDER — INSULIN ASPART 100 UNIT/ML ~~LOC~~ SOLN: 0.0000 [IU] | Freq: Three times a day (TID) | SUBCUTANEOUS | Status: DC

## 2020-01-31 MED ORDER — INSULIN GLARGINE 100 UNIT/ML ~~LOC~~ SOLN
20.0000 [IU] | SUBCUTANEOUS | Status: DC
  Filled 2020-01-31 (×2): qty 0.2

## 2020-01-31 MED ORDER — DEXTROSE 50 % IV SOLN
50.0000 mL | Freq: Once | INTRAVENOUS | Status: AC
  Administered 2020-01-31: 50 mL via INTRAVENOUS

## 2020-01-31 MED ORDER — ACETAMINOPHEN 325 MG PO TABS
650.0000 mg | ORAL_TABLET | Freq: Four times a day (QID) | ORAL | Status: DC | PRN
  Administered 2020-02-01: 650 mg via ORAL
  Filled 2020-01-31: qty 2

## 2020-01-31 MED ORDER — SODIUM CHLORIDE 0.9 % IV SOLN
1.0000 g | Freq: Once | INTRAVENOUS | Status: AC
  Administered 2020-01-31: 1 g via INTRAVENOUS
  Filled 2020-01-31: qty 10

## 2020-01-31 MED ORDER — SODIUM CHLORIDE 0.9 % IV SOLN
200.0000 mg | Freq: Once | INTRAVENOUS | Status: AC
  Administered 2020-01-31: 200 mg via INTRAVENOUS
  Filled 2020-01-31: qty 40

## 2020-01-31 MED ORDER — FERRIC CITRATE 1 GM 210 MG(FE) PO TABS
210.0000 mg | ORAL_TABLET | ORAL | Status: DC | PRN
  Filled 2020-01-31: qty 1

## 2020-01-31 MED ORDER — ATORVASTATIN CALCIUM 80 MG PO TABS
80.0000 mg | ORAL_TABLET | Freq: Every day | ORAL | Status: DC
  Administered 2020-02-01: 80 mg via ORAL
  Filled 2020-01-31: qty 1

## 2020-01-31 MED ORDER — SODIUM CHLORIDE 0.9 % IV SOLN
100.0000 mg | Freq: Every day | INTRAVENOUS | Status: DC
  Administered 2020-02-01: 100 mg via INTRAVENOUS
  Filled 2020-01-31: qty 20

## 2020-01-31 MED ORDER — DEXTROSE 50 % IV SOLN
INTRAVENOUS | Status: AC
  Filled 2020-01-31: qty 50

## 2020-01-31 MED ORDER — FERRIC CITRATE 1 GM 210 MG(FE) PO TABS: 210.0000 mg | ORAL_TABLET | ORAL | Status: DC

## 2020-01-31 MED ORDER — INSULIN GLARGINE 100 UNIT/ML ~~LOC~~ SOLN
50.0000 [IU] | SUBCUTANEOUS | Status: DC
  Filled 2020-01-31: qty 0.5

## 2020-01-31 MED ORDER — ASPIRIN EC 81 MG PO TBEC
81.0000 mg | DELAYED_RELEASE_TABLET | Freq: Every day | ORAL | Status: DC
Start: 2020-01-31 — End: 2020-02-01
  Administered 2020-01-31 – 2020-02-01 (×2): 81 mg via ORAL
  Filled 2020-01-31 (×2): qty 1

## 2020-01-31 NOTE — Consult Note (Signed)
Cardiology Consult    Patient ID: [name] MRN: [id number], DOB/AGE: October 21, 1960   Admit date: 01/31/2020 Date of Consult: 01/31/2020  Primary Physician: Benay Pike, MD Primary Cardiologist: Kirk Ruths, MD Requesting Provider: Milus Banister, DO  Patient Profile    Rachel Chaney is a 60 y.o. female with a complex PMHx including but not limited to HFpEF, pAF (single episode 4/21), cardiac arrest (around time of AF episode), T2DM, ESRD on MWF dialysis who presented for abdominal pain, hypoglycemia, and generalized malaise on whom we are consulted for elevated troponin.  History of Present Illness    60 year old female with history as above on whom we are consulted for elevated troponin.  She reports that she's been doing pretty well for the last few weeks but was sent here for low blood sugar noticed during dialysis.  She received glucose to replete blood glucose but was again hypoglycemic and encouraged to present to ED.  She was again treated with glucose but had persistent hypoglycemia and is admitted to the family medicine service to manage this.  Otherwise she's had fairly limited symptoms aside from some shortness of breath over the last few days and bothersome abdominal pain.  She describes epigastric discomfort that is not clearly related to eating.  She does think that it gets worse with exertion and is improved with rest.  The pain is nonradiating.  Denies orthopnea or PND.  She has been adherent to dialysis.  No cough or fevers but was notably found to be SARS-COV2+ on presentation here.  She was last evaluated by our group in early 2021 following a reported intradialytic cardiac arrest.  She was found unresponsive at that time, a pulse was not palpated, and she received 6 minutes CPR with ROSC and uneventful neurologic recovery.  She had a single 5 minute episode of AF following recovery but it was decided to forego anticoagulation.  Given multiple risk factors and  unclear cause of arrest, outpatient MPI was planned for risk stratification but this never occurred.  TTE showed moderate LVH, diastolic dysfunction, and preserved EF.  Troponin was checked on presentation here & elevated @ 267, 272 two hours apart.  Past Medical History   Past Medical History:  Diagnosis Date  . Acute respiratory failure with hypoxia ([place])   . Cardiac arrest ([place]) 02/28/2018   while on hemodialysis in hospital  . CHF (congestive heart failure) ([place])   . Chronic kidney disease due to diabetes mellitus ([place]) 10/13/2013  . Community acquired bilateral lower lobe pneumonia 11/11/2019  . Diabetes mellitus    Type II  . ESRD (end stage renal disease) ([place])    MWF - [facility]  . Excess fluid volume 12/16/2019  . Hyperlipidemia   . Hypertension   . Neuropathy     Past Surgical History:  Procedure Laterality Date  . AMPUTATION Right 02/27/2018   Procedure: RIGHT FOOT 2ND AND 3RD RAY AMPUTATION;  Surgeon: [name], MD;  Location: [facility];  Service: Orthopedics;  Laterality: Right;  . AMPUTATION Right 04/24/2018   Procedure: RIGHT TRANSMETATARSAL AMPUTATION, PLACE VAC;  Surgeon: [name], MD;  Location: [facility];  Service: Orthopedics;  Laterality: Right;  . AV FISTULA PLACEMENT Left 04/15/2015   Procedure: ARTERIOVENOUS (AV) FISTULA CREATION;  Surgeon: Mal Misty, MD;  Location: [facility];  Service: Vascular;  Laterality: Left;  . EYE SURGERY Right    retinal detachment  . FISTULA SUPERFICIALIZATION Left 06/03/2015   Procedure:  LEFT UPPER ARM BRACHIOCEPHALIC FISTULA SUPERFICIALIZATION;  Surgeon: Mal Misty, MD;  Location: [facility];  Service: Vascular;  Laterality: Left;  from [place] to [place] Right 05/24/2018   Procedure: REVISION RIGHT TRANSMETATARSAL AMPUTATION WITH EXCISION BONE/SOFT TISSUE, LOCAL TISSUE  RE-ARRANGEMENT  AND VAC PLACEMENT;  Surgeon: [name], MD;  Location: [facility];  Service: Orthopedics;  Laterality: Right;     No Known  Allergies Inpatient Medications    . aspirin EC  81 mg Oral Daily  . atorvastatin  80 mg Oral QHS  . ferric citrate  420 mg Oral TID WC  . heparin  5,000 Units Subcutaneous Q8H  . insulin glargine  50 Units Subcutaneous Q24H    Family History    Family History  Problem Relation Age of Onset  . Heart disease Mother   . Heart disease Father    She indicated that her mother is deceased. She indicated that her father is deceased. She indicated that all of her six sisters are alive. She indicated that all of her three brothers are alive. She indicated that her maternal grandmother is deceased. She indicated that her maternal grandfather is deceased. She indicated that her paternal grandmother is deceased. She indicated that her paternal grandfather is deceased. She indicated that her daughter is alive.   Social History    Social History   Socioeconomic History  . Marital status: Married    Spouse name: Not on file  . Number of children: Not on file  . Years of education: Not on file  . Highest education level: Not on file  Occupational History  . Not on file  Tobacco Use  . Smoking status: Never Smoker  . Smokeless tobacco: Never Used  Vaping Use  . Vaping Use: Never used  Substance and Sexual Activity  . Alcohol use: No    Alcohol/week: 0.0 standard drinks  . Drug use: No  . Sexual activity: Never  Other Topics Concern  . Not on file  Social History Narrative  . Not on file   Social Determinants of Health   Financial Resource Strain: Not on file  Food Insecurity: Not on file  Transportation Needs: Not on file  Physical Activity: Not on file  Stress: Not on file  Social Connections: Not on file  Intimate Partner Violence: Not on file     Review of Systems    General:  No chills, fever, night sweats or weight changes.  Cardiovascular:  No chest pain, dyspnea on exertion, edema, orthopnea, palpitations, paroxysmal nocturnal dyspnea. Dermatological: No rash,  lesions/masses Respiratory: No cough, dyspnea Urologic: No hematuria, dysuria Abdominal:   Abdominal pain No nausea, vomiting, diarrhea, bright red blood per rectum, melena, or hematemesis Neurologic:  No visual changes, wkns, changes in mental status. All other systems reviewed and are otherwise negative except as noted above.  Physical Exam    Blood pressure 108/84, pulse 91, temperature (!) 97.5 F (36.4 C), temperature source Oral, resp. rate (!) 23, height _0  (1.626 m), last menstrual period 09/18/2011, SpO2 95 %.    No intake or output data in the 24 hours ending 01/31/20 0731 Wt Readings from Last 3 Encounters:  01/06/20 101.2 kg  01/01/20 98.2 kg  12/15/19 102.2 kg    CONSTITUTIONAL: alert and conversant, well-appearing, nourished, no distress HEENT: oropharynx clear and moist, no mucosal lesions, normal dentition, conjunctiva normal, EOM intact, pupils equal, no lid lag. NECK: supple, no cervical adenopathy, no thyromegaly CARDIOVASCULAR: Regular rhythm.  No gallop, murmur, or rub. Normal S1/S2. Radial pulses intact. JVP flat PULMONARY/CHEST WALL: no deformities, normal breath sounds bilaterally, normal work of breathing ABDOMINAL: soft, non-tender, non-distended EXTREMITIES: no edema or muscle atrophy, warm and well-perfused SKIN: Wrapped lower extremities in areas of calciphylaxis NEUROLOGIC: alert, normal gait, no abnormal movements, cranial nerves grossly intact.   Labs   hsTropI 267, 272 BMP unremarkable for dialysis Hgb 10.3, no BNP   Radiology Studies    CTA PE IMPRESSION: 1. Suboptimal CTA. No evidence of pulmonary embolism to the segmental level. 2. Cardiomegaly, especially right heart dilatation. 3. Multi segment atelectasis and air trapping. Bronchopneumonia could easily be obscured due to the extent of pulmonary opacification. 4.  Aortic Atherosclerosis (ICD10-I70.0).  CT A/P IMPRESSION: 1. No acute finding. 2.  Cholelithiasis/sludge without  findings of acute cholecystitis. 3. Heterogeneous liver likely related to patient's dilated right heart, but there is also a vague 12 mm nodular appearance in the right lobe. Due to location and patient factors ultrasound follow-up is unlikely to be beneficial, recommend either multiphase CT or noncontrast MRI in 3-6 months, sooner if there is history of malignancy.  ECG & Cardiac Imaging   NSR, borderline RAD, non-specific intraventricular conduciton delay, low voltage suspect due to obesity.  Similar to prior, though voltages are lower.  TTE 03/08/18 1. The left ventricle has hyperdynamic systolic function, with an  ejection fraction of >65%. The cavity size was normal. There is severely  increased left ventricular wall thickness. Left ventricular diastolic  Doppler parameters are consistent with  impaired relaxation Indeterminent filling pressures The E/e' is 8-15.  2. The right ventricle has moderately reduced systolic function. The  cavity was moderately enlarged. There is no increase in right ventricular  wall thickness.  3. Left atrial size was mildly dilated.  4. Right atrial size was moderately dilated.  5. The mitral valve is degenerative. There is mild to moderate mitral  annular calcification present.  6. Moderate tricuspid stenosis.  7. The tricuspid valve is normal in structure.  8. The aortic valve was not well visualized.  9. The aortic root and ascending aorta are normal in size and structure.  10. The inferior vena cava was dilated in size with <50% respiratory  variability.  11. The interatrial septum was not well visualized.  12. When compared to the prior study: LVEF >65% on 03/01/2018.   Assessment & Plan    60 year old female with history of HFpEF, pAF (single episode 4/21), cardiac arrest (around time of AF episode), T2DM, ESRD on MWF dialysis complicated by calciphylaxis on whom we are consulted for troponin elevation.  Unclear what the unifying  diagnosis for her presentation is at this point with discordant findings including abdominal pain, recent hypoglycemic episodes, and SARS-COV2 positivity being pertinent positive findings.  Sepsis or overdose with antihyperglycemic could be underlying rpesentaiton.  We are consulted for her troponin elevation.  It is mildly elevated adynamic inconsistent with acute coronary syndrome and ECG is unchanged from prior and likely reflects supply demand mismatch.  It's certainly possible that abdominal pain could represent an anginal equivalent and agree that she should have risk stratification as an outpatient.  If she has persistent or recurrent abdominal pain that can not be otherwise explained would be reasonable to expedite evaluation as an inpatient.  Problem list Abdominal pain Troponin elevation Hypoglycemia ESRD c/b calciphylaxis RV dysfunction in setting of long-term dialysis Suspect pulmonary hypertenison SARS-COV2 positive History of resuscitated cardiac arrest  Recommendations #Abdominal pain #Troponin  elevation - Continue ASA 81 mg daily indefinitely for secondary prevention MI - Continue high intensity statin - Transthoracic echo - Repeat EKG, troponin if recurrent abdominal pain. - Plan for MPI as outpatient.  RV dysfunction in setting of long-term dialysis - Suspect due to long-standing HFpEF with pulmonary hypertension - Progressive heart failure with high output syndrome can be seen with long-term dialysis.  History of resuscitated cardiac arrest - No clear history of ventricular arrhythmia; no clear indication for beta blocker.  Signed, Delight Hoh, MD 01/31/2020, 7:31 AM  For questions or updates, please contact   Please consult [email or web address] for contact info under Cardiology/STEMI.

## 2020-01-31 NOTE — ED Notes (Signed)
RN and MD made aware of pts blood sugar.

## 2020-01-31 NOTE — ED Triage Notes (Addendum)
Pt BIB EMS from home. Went to dialysis at 0600, where CBG dropped. Glucose was given at dialysis, and CBG brought back up. After dialysis, abdominal pain began, rating 9/10. This evening after eating, CBG 38. Then, pt drank soda, CBG 99 on EMS arrival. No N/V, dizziness, weakness reported.   85% RA - 100% 2L [place] (no report of SOB)  Pt wears 2L [place] at baseline BP 148/68 HR 84  Left arm restricted

## 2020-01-31 NOTE — ED Notes (Signed)
Patient transported to CT 

## 2020-01-31 NOTE — ED Notes (Signed)
Orange juice given

## 2020-01-31 NOTE — ED Provider Notes (Addendum)
[facility] EMERGENCY DEPARTMENT Provider Note   CSN: [id number] Arrival date & time: 01/31/20  0272     History No chief complaint on file.   Rachel Chaney is a 60 y.o. female.  Patient presents to the emergency department with multiple episodes of hypoglycemia throughout the course of today.  Patient has a history of end-stage renal disease, on hemodialysis.  Blood sugar dropped at dialysis today, was given dextrose with improvement.  Patient reports after she got home she started having abdominal pain.  Blood sugar dropped again tonight, drank a soda and called EMS.  Blood sugar 99 at time of arrival of EMS, but has dropped again at arrival to the department.        Past Medical History:  Diagnosis Date  . Acute respiratory failure with hypoxia ([place])   . Cardiac arrest ([place]) 02/28/2018   while on hemodialysis in hospital  . CHF (congestive heart failure) ([place])   . Chronic kidney disease due to diabetes mellitus ([employer]) 10/13/2013  . Community acquired bilateral lower lobe pneumonia 11/11/2019  . Diabetes mellitus    Type II  . ESRD (end stage renal disease) ([place])    MWF - [facility]  . Excess fluid volume 12/16/2019  . Hyperlipidemia   . Hypertension   . Neuropathy     Patient Active Problem List   Diagnosis Date Noted  . 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 Blurred vision 01/28/2020  . Rash and nonspecific skin eruption 01/15/2020  . Infestation by bed bug 01/15/2020  . Xerosis of skin 01/06/2020  . Hemorrhoids 11/25/2019  . Papular rash 11/25/2019  . Obstructive sleep apnea 11/12/2019  . Hypercapnic respiratory failure ([place]) 11/12/2019  . Seizure-like activity ([place])   . 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 PAF (paroxysmal atrial fibrillation) ([place])   . Oxygen dependent   . Type 2 diabetes mellitus with diabetic neuropathy ([place])   . Severe protein-calorie malnutrition ([place])   . Seasonal allergies 04/12/2017  .  Post-menopausal bleeding 12/07/2015  . ESRD (end stage renal disease) on dialysis ([place]) 05/25/2015  . Hypoalbuminemia   . Leukocytosis 05/17/2013  . Abnormal appearance of cervix 08/08/2011  . Back pain 08/07/2011  . GERD (gastroesophageal reflux disease) 04/27/2010  . Hyperlipidemia 03/08/2006  . Morbid obesity ([place]) 03/08/2006  . Essential hypertension with morbid obesity 03/08/2006    Past Surgical History:  Procedure Laterality Date  . AMPUTATION Right 02/27/2018   Procedure: RIGHT FOOT 2ND AND 3RD RAY AMPUTATION;  Surgeon: [name], MD;  Location: [place];  Service: Orthopedics;  Laterality: Right;  . AMPUTATION Right 04/24/2018   Procedure: RIGHT TRANSMETATARSAL AMPUTATION, PLACE VAC;  Surgeon: [name], MD;  Location: [place];  Service: Orthopedics;  Laterality: Right;  . AV FISTULA PLACEMENT Left 04/15/2015   Procedure: ARTERIOVENOUS (AV) FISTULA CREATION;  Surgeon: Mal Misty, MD;  Location: [facility];  Service: Vascular;  Laterality: Left;  . EYE SURGERY Right    retinal detachment  . FISTULA SUPERFICIALIZATION Left 06/03/2015   Procedure: LEFT UPPER ARM BRACHIOCEPHALIC FISTULA SUPERFICIALIZATION;  Surgeon: Mal Misty, MD;  Location: [place];  Service: Vascular;  Laterality: Left;  from [place] to General  . STUMP REVISION Right 05/24/2018   Procedure: REVISION RIGHT TRANSMETATARSAL AMPUTATION WITH EXCISION BONE/SOFT TISSUE, LOCAL TISSUE  RE-ARRANGEMENT  AND VAC PLACEMENT;  Surgeon: [name], MD;  Location: [facility];  Service: Orthopedics;  Laterality: Right;     OB History  No obstetric history on file.     Family History  Problem Relation Age of Onset  . Heart disease Mother   . Heart disease Father     Social History   Tobacco Use  . Smoking status: Never Smoker  . Smokeless tobacco: Never Used  Vaping Use  . Vaping Use: Never used  Substance Use Topics  . Alcohol use: No    Alcohol/week: 0.0 standard drinks  . Drug use: No    Home  Medications Prior to Admission medications   Medication Sig Start Date End Date Taking? Authorizing Provider  acetaminophen (TYLENOL) 500 MG tablet Take 1,000 mg by mouth every 6 (six) hours as needed for mild pain or headache.     [provider]  aspirin EC 81 MG tablet Take 1 tablet (81 mg total) by mouth daily. 04/30/18   Erlene Quan, PA-C  atorvastatin (LIPITOR) 80 MG tablet TAKE 1 TABLET(80 MG) BY MOUTH AT BEDTIME Patient taking differently: Take 80 mg by mouth at bedtime.  11/04/19   Benay Pike, MD  Blood Glucose Monitoring Suppl (ONE TOUCH ULTRA 2) w/Device KIT 1 kit by Does not apply route 3 (three) times daily. ICD-10 code: E11.40 03/26/18   Wille Celeste, PA-C  cetirizine (ZYRTEC) 10 MG tablet Take 10 mg by mouth as needed for allergies.  04/26/18   [provider]  cyclobenzaprine (FLEXERIL) 10 MG tablet TAKE 1 TABLET(10 MG) BY MOUTH THREE TIMES DAILY BETWEEN MEALS AND AT BEDTIME AS NEEDED FOR MUSCLE SPASMS Patient taking differently: Take 10 mg by mouth 3 (three) times daily as needed for muscle spasms.  12/09/19   Benay Pike, MD  Doxepin HCl 5 % CREA Apply to affected area up to 4 times a day as needed for up to eight days. 01/08/20   Benay Pike, MD  Etelcalcetide HCl (PARSABIV IV) Inject into the vein.  02/10/19 02/09/20  [provider]  ferric citrate (AURYXIA) 1 GM 210 MG(Fe) tablet Take 2 tablets (420 mg total) by mouth 3 (three) times daily with meals. Give two (420 mg) by mouth three times a day wih meals for ESRD Patient taking differently: Take 210-420 mg by mouth See admin instructions. Take 420 mg with each meal and 210 mg with snacks 03/26/18   Lassen, Arlo C, PA-C  fluticasone (FLONASE) 50 MCG/ACT nasal spray Place 1 spray into both nostrils daily. Patient taking differently: Place 1 spray into both nostrils daily as needed.  06/16/19   Benay Pike, MD  glucose blood (ONE TOUCH ULTRA TEST) test strip 1 each by Other route 3 (three)  times daily. ICD-10 code: E11.40. 03/26/18   Wille Celeste, PA-C  hydrOXYzine (ATARAX/VISTARIL) 10 MG tablet Take 1 tablet (10 mg total) by mouth daily as needed for itching. 01/15/20   [name], DO  insulin degludec (TRESIBA FLEXTOUCH) 100 UNIT/ML FlexTouch Pen Inject 50 Units into the skin in the morning. 11/19/19   Zenia Resides, MD  Insulin Pen Needle (B-D UF III MINI PEN NEEDLES) 31G X 5 MM MISC USE AS DIRECTED TWICE DAILY 04/30/19   Benay Pike, MD  Insulin Pen Needle 29G X 12MM MISC Inject 1 pen into the skin 2 (two) times daily. Check blood sugar daily. 03/26/18   [place] Lewis C, PA-C  Insulin Syringe-Needle U-100 Flossie Buffy INSULIN SYRINGE) 30G X 5/16" 1 ML MISC USE AS DIRECTED 03/26/18   [place] Lewis C, PA-C  multivitamin (RENA-VIT) TABS tablet Take 1  tablet by mouth daily.    [provider]  nystatin ointment (MYCOSTATIN) APPLY TOPICALLY TO THE AFFECTED AREA TWICE DAILY 01/21/20   Benay Pike, MD  OneTouch Delica Lancets 76O MISC 1 Device by Does not apply route as needed (to check blood glucose). 03/26/18   [place] Lewis C, PA-C  traMADol (ULTRAM) 50 MG tablet Take 1 tablet (50 mg total) by mouth every 12 (twelve) hours as needed for severe pain. 01/28/20 02/27/20  Meccariello, Bernita Raisin, DO  triamcinolone ointment (KENALOG) 0.5 % Apply 1 application topically 2 (two) times daily. 01/15/20   [name], DO  progesterone (PROMETRIUM) 200 MG capsule Take 1 capsule (200 mg total) by mouth daily. Take for 10 days if bleeding occurs for more than 4 days. 11/09/10 03/28/11  Lyndal Pulley, DO    Allergies    Patient has no known allergies.  Review of Systems   Review of Systems  Gastrointestinal: Positive for abdominal pain.  All other systems reviewed and are negative.   Physical Exam Updated Vital Signs BP (!) 145/62   Pulse 89   Temp (!) 97.5 F (36.4 C) (Oral)   Resp 14   Ht _0  (1.626 m)   LMP 09/18/2011   SpO2 97%   BMI 38.31 kg/m   Physical  Exam Vitals and nursing note reviewed.  Constitutional:      General: She is not in acute distress.    Appearance: Normal appearance. She is well-developed and well-nourished.  HENT:     Head: Normocephalic and atraumatic.     Right Ear: Hearing normal.     Left Ear: Hearing normal.     Nose: Nose normal.     Mouth/Throat:     Mouth: Oropharynx is clear and moist and mucous membranes are normal.  Eyes:     Extraocular Movements: EOM normal.     Conjunctiva/sclera: Conjunctivae normal.     Pupils: Pupils are equal, round, and reactive to light.  Cardiovascular:     Rate and Rhythm: Regular rhythm.     Heart sounds: S1 normal and S2 normal. No murmur heard. No friction rub. No gallop.   Pulmonary:     Effort: Pulmonary effort is normal. No respiratory distress.     Breath sounds: Normal breath sounds.  Chest:     Chest wall: No tenderness.  Abdominal:     General: Bowel sounds are normal.     Palpations: Abdomen is soft. There is no hepatosplenomegaly.     Tenderness: There is no abdominal tenderness. There is no guarding or rebound. Negative signs include Murphy's sign and McBurney's sign.     Hernia: No hernia is present.  Musculoskeletal:        General: Normal range of motion.     Cervical back: Normal range of motion and neck supple.  Skin:    General: Skin is warm, dry and intact.     Findings: No rash.     Nails: There is no cyanosis.  Neurological:     Mental Status: She is alert and oriented to person, place, and time.     GCS: GCS eye subscore is 4. GCS verbal subscore is 5. GCS motor subscore is 6.     Cranial Nerves: No cranial nerve deficit.     Sensory: No sensory deficit.     Coordination: Coordination normal.     Deep Tendon Reflexes: Strength normal.  Psychiatric:        Mood and Affect: Mood  and affect normal.        Speech: Speech normal.        Behavior: Behavior normal.        Thought Content: Thought content normal.     ED Results / Procedures /  Treatments   Labs (all labs ordered are listed, but only abnormal results are displayed) Labs Reviewed  CBC WITH DIFFERENTIAL/PLATELET - Abnormal; Notable for the following components:      Result Value   RBC 3.29 (*)    Hemoglobin 10.3 (*)    HCT 35.2 (*)    MCV 107.0 (*)    MCHC 29.3 (*)    RDW 17.4 (*)    nRBC 0.3 (*)    Lymphs Abs 0.5 (*)    All other components within normal limits  COMPREHENSIVE METABOLIC PANEL - Abnormal; Notable for the following components:   Chloride 91 (*)    Glucose, Bld 192 (*)    Creatinine, Ser 5.82 (*)    Calcium 8.3 (*)    Total Protein 5.8 (*)    Albumin 2.6 (*)    GFR, Estimated 8 (*)    All other components within normal limits  CBG MONITORING, ED - Abnormal; Notable for the following components:   Glucose-Capillary 44 (*)    All other components within normal limits  CBG MONITORING, ED - Abnormal; Notable for the following components:   Glucose-Capillary 33 (*)    All other components within normal limits  CBG MONITORING, ED - Abnormal; Notable for the following components:   Glucose-Capillary 156 (*)    All other components within normal limits  CBG MONITORING, ED - Abnormal; Notable for the following components:   Glucose-Capillary 129 (*)    All other components within normal limits  CBG MONITORING, ED - Abnormal; Notable for the following components:   Glucose-Capillary 104 (*)    All other components within normal limits  TROPONIN I (HIGH SENSITIVITY) - Abnormal; Notable for the following components:   Troponin I (High Sensitivity) 267 (*)    All other components within normal limits  SARS CORONAVIRUS 2 BY RT PCR (HOSPITAL ORDER, [facility])  LIPASE, BLOOD  URINALYSIS, ROUTINE W REFLEX MICROSCOPIC  TROPONIN I (HIGH SENSITIVITY)    EKG EKG Interpretation  Date/Time:  Saturday January 31 2020 01:40:36 EST Ventricular Rate:  87 PR Interval:    QRS Duration: 118 QT Interval:  400 QTC  Calculation: 482 R Axis:   169 Text Interpretation: Sinus rhythm Probable left atrial enlargement Left posterior fascicular block Low voltage, precordial leads Confirmed by Orpah Greek [phone]) on 01/31/2020 2:08:24 AM   Radiology CT ANGIO CHEST PE W OR WO CONTRAST  Result Date: 01/31/2020 CLINICAL DATA:  Abdominal pain after dialysis. Pulmonary embolism suspected EXAM: CT ANGIOGRAPHY CHEST WITH CONTRAST TECHNIQUE: Multidetector CT imaging of the chest was performed using the standard protocol during bolus administration of intravenous contrast. Multiplanar CT image reconstructions and MIPs were obtained to evaluate the vascular anatomy. CONTRAST:  130m OMNIPAQUE IOHEXOL 350 MG/ML SOLN COMPARISON:  03/07/2018 FINDINGS: Cardiovascular: Cardiomegaly, especially dilatation of the right heart. Diffuse aortic and coronary atherosclerosis. Suboptimal CT of the pulmonary arteries primarily due to body habitus but also exacerbated by bolus diffusion. Aortic valvular calcification. No acute aortic finding. Mediastinum/Nodes: Negative for adenopathy or mass Lungs/Pleura: Bands of opacity with volume loss throughout the lungs. Lung volumes are generally low. Mosaic attenuation in the upper lung zones. No effusion or generalized interlobular septal thickening. Upper Abdomen:  Reported separately Musculoskeletal: Exuberant osteophytes with multilevel thoracic ankylosis and costovertebral ankylosis. Remote and healed mid sternal fracture. Review of the MIP images confirms the above findings. IMPRESSION: 1. Suboptimal CTA. No evidence of pulmonary embolism to the segmental level. 2. Cardiomegaly, especially right heart dilatation. 3. Multi segment atelectasis and air trapping. Bronchopneumonia could easily be obscured due to the extent of pulmonary opacification. 4.  Aortic Atherosclerosis (ICD10-I70.0). Electronically Signed   By: Monte Fantasia M.D.   On: 01/31/2020 04:27   CT ABDOMEN PELVIS W  CONTRAST  Result Date: 01/31/2020 CLINICAL DATA:  Nonlocalized abdominal pain EXAM: CT ABDOMEN AND PELVIS WITH CONTRAST TECHNIQUE: Multidetector CT imaging of the abdomen and pelvis was performed using the standard protocol following bolus administration of intravenous contrast. CONTRAST:  156m OMNIPAQUE IOHEXOL 350 MG/ML SOLN COMPARISON:  None. FINDINGS: Lower chest:  Reported separately Hepatobiliary: Somewhat heterogeneous enhancement of the liver diffusely, possibly from patient's right heart dilatation. There is a more rounded vague nodule in the posterior right lobe measuring 12 mm on 5:14. No abnormality seen in this area in the arterial phase. This area is not covered on the delayed phase.Dependent high-density in the gallbladder compatible with sludge/small calculi. No evidence of acute cholecystitis. Pancreas: Unremarkable. Spleen: Unremarkable. Adrenals/Urinary Tract: Negative adrenals. No hydronephrosis or stone. Cystic densities in the bilateral kidney measuring up to 17 mm in the left lower pole. Poor renal enhancement bilaterally in the setting of end-stage renal disease. Unremarkable bladder. Stomach/Bowel:  No obstruction. No visible bowel inflammation Vascular/Lymphatic: Confluent arterial calcifications. No mass or adenopathy. Reproductive:Dystrophic and vascular uterine calcifications. Other: No ascites or pneumoperitoneum. Dependent skin thickening and anasarca. Presumed medication injection sites in the subcutaneous ventral abdomen. Musculoskeletal: No acute abnormalities. Subjectively sclerotic appearance of the bones but without subchondral erosions confirmatory of renal osteodystrophy. IMPRESSION: 1. No acute finding. 2.  Cholelithiasis/sludge without findings of acute cholecystitis. 3. Heterogeneous liver likely related to patient's dilated right heart, but there is also a vague 12 mm nodular appearance in the right lobe. Due to location and patient factors ultrasound follow-up is  unlikely to be beneficial, recommend either multiphase CT or noncontrast MRI in 3-6 months, sooner if there is history of malignancy. Electronically Signed   By: JMonte FantasiaM.D.   On: 01/31/2020 04:34    Procedures Procedures (including critical care time)  Medications Ordered in ED Medications  cefTRIAXone (ROCEPHIN) 1 g in sodium chloride 0.9 % 100 mL IVPB (has no administration in time range)  azithromycin (ZITHROMAX) 500 mg in sodium chloride 0.9 % 250 mL IVPB (has no administration in time range)  dextrose 50 % solution 50 mL (50 mLs Intravenous Given 01/31/20 0123)  iohexol (OMNIPAQUE) 350 MG/ML injection 100 mL (100 mLs Intravenous Contrast Given 01/31/20 0416)    ED Course  I have reviewed the triage vital signs and the nursing notes.  Pertinent labs & imaging results that were available during my care of the patient were reviewed by me and considered in my medical decision making (see chart for details).    MDM Rules/Calculators/A&P                          Patient presented to the emergency department for evaluation of low blood sugar.  Patient has had recurrent episodes of hypoglycemia today, first episode while at dialysis.  Patient initially treated with oral glucose but had recurrent hypoglycemia, given IV dextrose and has maintained her blood sugars.  Etiology of the hypoglycemia  was unclear.  She complained of some abdominal pain earlier but had a benign abdominal exam at arrival.  She no longer had abdominal pain.  Patient noted to have tachypnea on exam.  Lungs are mostly clear, however.  She is not hypoxic.    Patient noted to have elevated troponin.  This was ordered because of the recurrent hypoglycemia without clear cause.  EKG does not suggest ischemia or infarct but her first troponin is elevated.  Patient underwent CT angiography of chest to rule out PE and scan was continued down through the abdomen and pelvis to further evaluate her abdominal pain.  No PE  seen to the subsegmental level.  There are opacities that could be consistent with bronchopneumonia, however.  Will empirically cover.  She has a gallstone without evidence of cholecystitis, no other acute pathology noted.  Addendum: COVID PCR is positive.  Pulmonary findings are likely COVID pneumonitis, community-acquired pneumonia.  Final Clinical Impression(s) / ED Diagnoses Final diagnoses:  Elevated troponin  Pneumonia due to infectious organism, unspecified laterality, unspecified part of lung  Hypoglycemia    Rx / DC Orders ED Discharge Orders    None       Rochanda Harpham, Gwenyth Allegra, MD 01/31/20 0451    Orpah Greek, MD 01/31/20 0700

## 2020-01-31 NOTE — Consult Note (Signed)
[facility] Renal Consultation Note    Indication for Consultation:  Management of ESRD/hemodialysis, anemia, hypertension/volume, and secondary hyperparathyroidism. PCP:  HPI: Rachel Chaney is a 60 y.o. female with ESRD, T2DM, HTN, and Hx cardiac arrest who was admitted OBS status for hypoglycemia, elevated troponin, and incidentally COVID +.  Presented to ED via EMS for recurrent hypoglycemic episodes and abdominal pain. Started while at dialysis yesterday, then BS improved, then low again at 38. She was given D50 and juice to [facility] and brought to ED. In ED, vitals were ok and she was afebrile. Labs showed Na 135, K 3.7, Glu 192, Ca 8.3, Alb 2.6, WBC 7.5, Hgb 10.3, Trop was elevated 267 -> 272 and she was found to be COVID +. CTA chest was without PE, but with streaky opacification. Abd CT with incidental liver nodule, otherwise ok.  Cardiology consulted for the elevated troponin -> echo pending. ?Demand ischemia.  She was started on 3-d course of Remdesivir for COVID pneumonitis.  Seen in ICU bed this morning. Denies CP, dyspnea, abd pain, N/V, fever, diarrhea. She is very tired, did not sleep much overnight. + fatigue.   S/p full dialysis on 1/21 prior to admit. She dialyzes on MWF schedule at [facility]. She will next be due for dialysis on Monday 02/02/20.  Past Medical History:  Diagnosis Date  . Acute respiratory failure with hypoxia ([place])   . Cardiac arrest ([place]) 02/28/2018   while on hemodialysis in hospital  . CHF (congestive heart failure) ([place])   . Chronic kidney disease due to diabetes mellitus ([place]) 10/13/2013  . Community acquired bilateral lower lobe pneumonia 11/11/2019  . Diabetes mellitus    Type II  . ESRD (end stage renal disease) ([place])    MWF - [facility]  . Excess fluid volume 12/16/2019  . Hyperlipidemia   . Hypertension   . Neuropathy    Past Surgical History:  Procedure Laterality Date  . AMPUTATION Right 02/27/2018    Procedure: RIGHT FOOT 2ND AND 3RD RAY AMPUTATION;  Surgeon: [name], MD;  Location: [place];  Service: Orthopedics;  Laterality: Right;  . AMPUTATION Right 04/24/2018   Procedure: RIGHT TRANSMETATARSAL AMPUTATION, PLACE VAC;  Surgeon: [name], MD;  Location: [place];  Service: Orthopedics;  Laterality: Right;  . AV FISTULA PLACEMENT Left 04/15/2015   Procedure: ARTERIOVENOUS (AV) FISTULA CREATION;  Surgeon: Mal Misty, MD;  Location: [place];  Service: Vascular;  Laterality: Left;  . EYE SURGERY Right    retinal detachment  . FISTULA SUPERFICIALIZATION Left 06/03/2015   Procedure: LEFT UPPER ARM BRACHIOCEPHALIC FISTULA SUPERFICIALIZATION;  Surgeon: Mal Misty, MD;  Location: [place];  Service: Vascular;  Laterality: Left;  from [place] to [facility]  . STUMP REVISION Right 05/24/2018   Procedure: REVISION RIGHT TRANSMETATARSAL AMPUTATION WITH EXCISION BONE/SOFT TISSUE, LOCAL TISSUE  RE-ARRANGEMENT  AND VAC PLACEMENT;  Surgeon: [name], MD;  Location: [place];  Service: Orthopedics;  Laterality: Right;   Family History  Problem Relation Age of Onset  . Heart disease Mother   . Heart disease Father    Social History:  reports that she has never smoked. She has never used smokeless tobacco. She reports that she does not drink alcohol and does not use drugs.  ROS: As per HPI otherwise negative.  Physical Exam: Vitals:   01/31/20 0545 01/31/20 0649 01/31/20 0715 01/31/20 1025  BP: (!) 140/41 108/84 116/74 (!) 99/55  Pulse: 88 91 90  87  Resp: 17 (!) _0 Temp:      TempSrc:      SpO2: 93% 95% 98% 94%  Height:         General: Well developed, well nourished, NAD. On nasal O2, drowsy. Head: Normocephalic, atraumatic, sclera non-icteric, mucus membranes are moist. Neck: Supple without lymphadenopathy/masses. JVD not elevated. Lungs: Clear bilaterally to auscultation without wheezes, rales, or rhonchi.  Heart: RRR with normal S1, S2. No murmurs, rubs, or gallops  appreciated. Abdomen: Soft, non-tender, non-distended with normoactive bowel sounds. Musculoskeletal:  Strength and tone appear normal for age. Lower extremities: Patchy, firm edema in R thigh. Trace LE edema Neuro: Alert and oriented X 3. Moves all extremities spontaneously. Psych:  Responds to questions appropriately with a normal affect. Dialysis Access: LUE AVF + bruit  No Known Allergies Prior to Admission medications   Medication Sig Start Date End Date Taking? Authorizing Provider  acetaminophen (TYLENOL) 500 MG tablet Take 1,000 mg by mouth every 6 (six) hours as needed for mild pain or headache.    Yes [provider]  aspirin EC 81 MG tablet Take 1 tablet (81 mg total) by mouth daily. 04/30/18  Yes Kilroy, Luke K, PA-C  atorvastatin (LIPITOR) 80 MG tablet TAKE 1 TABLET(80 MG) BY MOUTH AT BEDTIME Patient taking differently: Take 80 mg by mouth at bedtime. 11/04/19  Yes Benay Pike, MD  Blood Glucose Monitoring Suppl (ONE TOUCH ULTRA 2) w/Device KIT 1 kit by Does not apply route 3 (three) times daily. ICD-10 code: E11.40 03/26/18  Yes Oscar La, Arlo C, PA-C  cetirizine (ZYRTEC) 10 MG tablet Take 10 mg by mouth as needed for allergies.  04/26/18  Yes [provider]  cyclobenzaprine (FLEXERIL) 10 MG tablet TAKE 1 TABLET(10 MG) BY MOUTH THREE TIMES DAILY BETWEEN MEALS AND AT BEDTIME AS NEEDED FOR MUSCLE SPASMS Patient taking differently: Take 10 mg by mouth 3 (three) times daily as needed for muscle spasms. 12/09/19  Yes Benay Pike, MD  Doxepin HCl 5 % CREA Apply to affected area up to 4 times a day as needed for up to eight days. 01/08/20  Yes Benay Pike, MD  Etelcalcetide HCl (PARSABIV IV) Inject into the vein.  02/10/19 02/09/20 Yes [provider]  ferric citrate (AURYXIA) 1 GM 210 MG(Fe) tablet Take 2 tablets (420 mg total) by mouth 3 (three) times daily with meals. Give two (420 mg) by mouth three times a day wih meals for ESRD Patient taking  differently: Take 210-420 mg by mouth See admin instructions. Take 420 mg with each meal and 210 mg with snacks 03/26/18  Yes Lassen, Arlo C, PA-C  fluticasone (FLONASE) 50 MCG/ACT nasal spray Place 1 spray into both nostrils daily. Patient taking differently: Place 1 spray into both nostrils daily as needed. 06/16/19  Yes Benay Pike, MD  glucose blood (ONE TOUCH ULTRA TEST) test strip 1 each by Other route 3 (three) times daily. ICD-10 code: E11.40. 03/26/18  Yes Lassen, Arlo C, PA-C  hydrOXYzine (ATARAX/VISTARIL) 10 MG tablet Take 1 tablet (10 mg total) by mouth daily as needed for itching. 01/15/20  Yes Beard, Samantha N, DO  insulin degludec (TRESIBA FLEXTOUCH) 100 UNIT/ML FlexTouch Pen Inject 50 Units into the skin in the morning. 11/19/19  Yes Hensel, Jamal Collin, MD  Insulin Pen Needle (B-D UF III MINI PEN NEEDLES) 31G X 5 MM MISC USE AS DIRECTED TWICE DAILY 04/30/19  Yes Benay Pike, MD  Insulin Pen Needle  29G X 12MM MISC Inject 1 pen into the skin 2 (two) times daily. Check blood sugar daily. 03/26/18  Yes Lassen, Arlo C, PA-C  Insulin Syringe-Needle U-100 Flossie Buffy INSULIN SYRINGE) 30G X 5/16" 1 ML MISC USE AS DIRECTED 03/26/18  Yes Oscar La, Arlo C, PA-C  multivitamin (RENA-VIT) TABS tablet Take 1 tablet by mouth daily.   Yes [provider]  OneTouch Delica Lancets 50K MISC 1 Device by Does not apply route as needed (to check blood glucose). 03/26/18  Yes Lassen, Arlo C, PA-C  traMADol (ULTRAM) 50 MG tablet Take 1 tablet (50 mg total) by mouth every 12 (twelve) hours as needed for severe pain. 01/28/20 02/27/20 Yes Meccariello, Bernita Raisin, DO  nystatin ointment (MYCOSTATIN) Apply 1 application topically 2 (two) times daily. Patient not taking: Reported on 01/31/2020    [provider]  triamcinolone ointment (KENALOG) 0.5 % Apply 1 application topically 2 (two) times daily. Patient not taking: Reported on 01/31/2020    [provider]  progesterone (PROMETRIUM) 200 MG  capsule Take 1 capsule (200 mg total) by mouth daily. Take for 10 days if bleeding occurs for more than 4 days. 11/09/10 03/28/11  Lyndal Pulley, DO   Current Facility-Administered Medications  Medication Dose Route Frequency Provider Last Rate Last Admin  . acetaminophen (TYLENOL) tablet 650 mg  650 mg Oral Q6H PRN Maness, Philip, MD      . aspirin EC tablet 81 mg  81 mg Oral Daily Delora Fuel, MD   81 mg at 01/31/20 9381  . atorvastatin (LIPITOR) tablet 80 mg  80 mg Oral QHS Maness, Philip, MD      . cyclobenzaprine (FLEXERIL) tablet 10 mg  10 mg Oral TID PRN Delora Fuel, MD      . ferric citrate (AURYXIA) tablet 210 mg  210 mg Oral PRN Maness, Philip, MD      . ferric citrate (AURYXIA) tablet 420 mg  420 mg Oral TID WC Delora Fuel, MD   420 mg at 01/31/20 8299  . heparin injection 5,000 Units  5,000 Units Subcutaneous Q8H Delora Fuel, MD   5,000 Units at 01/31/20 [phone]  . insulin aspart (novoLOG) injection 0-9 Units  0-9 Units Subcutaneous TID WC Maness, Philip, MD      . insulin glargine (LANTUS) injection 20 Units  20 Units Subcutaneous Q24H Maness, Philip, MD      . remdesivir 200 mg in sodium chloride 0.9% 250 mL IVPB  200 mg Intravenous Once Bertis Ruddy, RPH       Followed by  . [START ON 02/01/2020] remdesivir 100 mg in sodium chloride 0.9 % 100 mL IVPB  100 mg Intravenous Daily Bertis Ruddy, [facility]       Current Outpatient Medications  Medication Sig Dispense Refill  . acetaminophen (TYLENOL) 500 MG tablet Take 1,000 mg by mouth every 6 (six) hours as needed for mild pain or headache.     Marland Kitchen aspirin EC 81 MG tablet Take 1 tablet (81 mg total) by mouth daily. 90 tablet 3  . atorvastatin (LIPITOR) 80 MG tablet TAKE 1 TABLET(80 MG) BY MOUTH AT BEDTIME (Patient taking differently: Take 80 mg by mouth at bedtime.) 90 tablet 3  . Blood Glucose Monitoring Suppl (ONE TOUCH ULTRA 2) w/Device KIT 1 kit by Does not apply route 3 (three) times daily. ICD-10 code: E11.40 1 each 0   . cetirizine (ZYRTEC) 10 MG tablet Take 10 mg by mouth as needed for allergies.     . cyclobenzaprine (FLEXERIL)  10 MG tablet TAKE 1 TABLET(10 MG) BY MOUTH THREE TIMES DAILY BETWEEN MEALS AND AT BEDTIME AS NEEDED FOR MUSCLE SPASMS (Patient taking differently: Take 10 mg by mouth 3 (three) times daily as needed for muscle spasms.) 30 tablet 0  . Doxepin HCl 5 % CREA Apply to affected area up to 4 times a day as needed for up to eight days. 30 g 1  . Etelcalcetide HCl (PARSABIV IV) Inject into the vein.     . ferric citrate (AURYXIA) 1 GM 210 MG(Fe) tablet Take 2 tablets (420 mg total) by mouth 3 (three) times daily with meals. Give two (420 mg) by mouth three times a day wih meals for ESRD (Patient taking differently: Take 210-420 mg by mouth See admin instructions. Take 420 mg with each meal and 210 mg with snacks) 180 tablet 0  . fluticasone (FLONASE) 50 MCG/ACT nasal spray Place 1 spray into both nostrils daily. (Patient taking differently: Place 1 spray into both nostrils daily as needed.) 16 g 5  . glucose blood (ONE TOUCH ULTRA TEST) test strip 1 each by Other route 3 (three) times daily. ICD-10 code: E11.40. 100 each 0  . hydrOXYzine (ATARAX/VISTARIL) 10 MG tablet Take 1 tablet (10 mg total) by mouth daily as needed for itching. 15 tablet 0  . insulin degludec (TRESIBA FLEXTOUCH) 100 UNIT/ML FlexTouch Pen Inject 50 Units into the skin in the morning. 27 mL 0  . Insulin Pen Needle (B-D UF III MINI PEN NEEDLES) 31G X 5 MM MISC USE AS DIRECTED TWICE DAILY 200 each 0  . Insulin Pen Needle 29G X 12MM MISC Inject 1 pen into the skin 2 (two) times daily. Check blood sugar daily. 100 each 0  . Insulin Syringe-Needle U-100 (ULTICARE INSULIN SYRINGE) 30G X 5/16" 1 ML MISC USE AS DIRECTED 100 each 0  . multivitamin (RENA-VIT) TABS tablet Take 1 tablet by mouth daily.    Glory Rosebush Delica Lancets 75I MISC 1 Device by Does not apply route as needed (to check blood glucose). 100 each 0  . traMADol (ULTRAM)  50 MG tablet Take 1 tablet (50 mg total) by mouth every 12 (twelve) hours as needed for severe pain. 60 tablet 0  . nystatin ointment (MYCOSTATIN) Apply 1 application topically 2 (two) times daily. (Patient not taking: Reported on 01/31/2020)    . triamcinolone ointment (KENALOG) 0.5 % Apply 1 application topically 2 (two) times daily. (Patient not taking: Reported on 01/31/2020)     Labs: Basic Metabolic Panel: Recent Labs  Lab 01/31/20 0142  NA 135  K 3.7  CL 91*  CO2 30  GLUCOSE 192*  BUN 21  CREATININE 5.82*  CALCIUM 8.3*   Liver Function Tests: Recent Labs  Lab 01/31/20 0142  AST 27  ALT 23  ALKPHOS 60  BILITOT 1.1  PROT 5.8*  ALBUMIN 2.6*   Recent Labs  Lab 01/31/20 0142  LIPASE 19   CBC: Recent Labs  Lab 01/31/20 0142  WBC 7.5  NEUTROABS 6.5  HGB 10.3*  HCT 35.2*  MCV 107.0*  PLT 153   CBG: Recent Labs  Lab 01/31/20 0100 01/31/20 0115 01/31/20 0134 01/31/20 0230 01/31/20 0331  GLUCAP 44* 33* 156* 129* 104*   Studies/Results: CT ANGIO CHEST PE W OR WO CONTRAST  Result Date: 01/31/2020 CLINICAL DATA:  Abdominal pain after dialysis. Pulmonary embolism suspected EXAM: CT ANGIOGRAPHY CHEST WITH CONTRAST TECHNIQUE: Multidetector CT imaging of the chest was performed using the standard protocol during bolus administration of intravenous  contrast. Multiplanar CT image reconstructions and MIPs were obtained to evaluate the vascular anatomy. CONTRAST:  136m OMNIPAQUE IOHEXOL 350 MG/ML SOLN COMPARISON:  03/07/2018 FINDINGS: Cardiovascular: Cardiomegaly, especially dilatation of the right heart. Diffuse aortic and coronary atherosclerosis. Suboptimal CT of the pulmonary arteries primarily due to body habitus but also exacerbated by bolus diffusion. Aortic valvular calcification. No acute aortic finding. Mediastinum/Nodes: Negative for adenopathy or mass Lungs/Pleura: Bands of opacity with volume loss throughout the lungs. Lung volumes are generally low. Mosaic  attenuation in the upper lung zones. No effusion or generalized interlobular septal thickening. Upper Abdomen: Reported separately Musculoskeletal: Exuberant osteophytes with multilevel thoracic ankylosis and costovertebral ankylosis. Remote and healed mid sternal fracture. Review of the MIP images confirms the above findings. IMPRESSION: 1. Suboptimal CTA. No evidence of pulmonary embolism to the segmental level. 2. Cardiomegaly, especially right heart dilatation. 3. Multi segment atelectasis and air trapping. Bronchopneumonia could easily be obscured due to the extent of pulmonary opacification. 4.  Aortic Atherosclerosis (ICD10-I70.0). Electronically Signed   By: JMonte FantasiaM.D.   On: 01/31/2020 04:27   CT ABDOMEN PELVIS W CONTRAST  Result Date: 01/31/2020 CLINICAL DATA:  Nonlocalized abdominal pain EXAM: CT ABDOMEN AND PELVIS WITH CONTRAST TECHNIQUE: Multidetector CT imaging of the abdomen and pelvis was performed using the standard protocol following bolus administration of intravenous contrast. CONTRAST:  1017mOMNIPAQUE IOHEXOL 350 MG/ML SOLN COMPARISON:  None. FINDINGS: Lower chest:  Reported separately Hepatobiliary: Somewhat heterogeneous enhancement of the liver diffusely, possibly from patient's right heart dilatation. There is a more rounded vague nodule in the posterior right lobe measuring 12 mm on 5:14. No abnormality seen in this area in the arterial phase. This area is not covered on the delayed phase.Dependent high-density in the gallbladder compatible with sludge/small calculi. No evidence of acute cholecystitis. Pancreas: Unremarkable. Spleen: Unremarkable. Adrenals/Urinary Tract: Negative adrenals. No hydronephrosis or stone. Cystic densities in the bilateral kidney measuring up to 17 mm in the left lower pole. Poor renal enhancement bilaterally in the setting of end-stage renal disease. Unremarkable bladder. Stomach/Bowel:  No obstruction. No visible bowel inflammation  Vascular/Lymphatic: Confluent arterial calcifications. No mass or adenopathy. Reproductive:Dystrophic and vascular uterine calcifications. Other: No ascites or pneumoperitoneum. Dependent skin thickening and anasarca. Presumed medication injection sites in the subcutaneous ventral abdomen. Musculoskeletal: No acute abnormalities. Subjectively sclerotic appearance of the bones but without subchondral erosions confirmatory of renal osteodystrophy. IMPRESSION: 1. No acute finding. 2.  Cholelithiasis/sludge without findings of acute cholecystitis. 3. Heterogeneous liver likely related to patient's dilated right heart, but there is also a vague 12 mm nodular appearance in the right lobe. Due to location and patient factors ultrasound follow-up is unlikely to be beneficial, recommend either multiphase CT or noncontrast MRI in 3-6 months, sooner if there is history of malignancy. Electronically Signed   By: JoMonte Fantasia.D.   On: 01/31/2020 04:34    Dialysis Orders: MWF @ [facility]:30hr, 450/800, EDW 97kg, 2K/2Ca, UFP #4, AVF, heparin 8000u bolus - Sensipar 30 mg PO q HD - Hectoral 1135mIV q HD - Mircera 100 q 2 weeks - ordered, not yet given, Hgb 10.8 with tsat 9 on 01/28/20  Assessment/Plan: 1.  Hypoglycemia: Possibly d/t viral infection, home insulin lowered per primary, follow. 2.  Elevated troponin: Trop 267 -> 272, echo pending. Per cards. Hx cardiac arrest. 3.  COVID-19 infection: No pneumonia on CT - for 3d course of remdesivir. 4.  ESRD: Continue HD per MWF schedule. Next on Monday assuming remains stable -  anticipate as outpatient on isolation 3rd shift if possible. Notified her home HD unit to expect her. 5.  Hypertension/volume: BP stable, no dyspnea.  6.  Anemia: Hgb 10.3 - start ESA with next HD 7.  Metabolic bone disease: CorrCa ok, Phos pending. Continue home binders 8.  R thigh firm areas, ?edema v. morphea v. fibrosis. Follow.  Rachel Penton, PA-C 01/31/2020, 11:48 AM  [employer]

## 2020-01-31 NOTE — Progress Notes (Signed)
Renal Navigator notes patient's positive COVID test result (01/31/20). Patient will need to dialyze in isolation for at least 14 days from this date in her outpatient clinic/East at discharge. East iso shift: MWF 4:30pm-7:30pm. Navigator called patient's spouse to inform and he states understanding and that he normally transports patient to HD and will continue even with temporary schedule change. Navigator states appreciation.  Renal Team hopeful that patient's next HD will be at her outpatient HD clinic Monday 02/02/20 at 4:30pm. Inpt HD unit experiencing critical staffing shortage and extremely high patient volumes currently. No barriers to discharge from Outpatient HD standpoint. Please contact Renal Navigator with questions or concerns.   Alphonzo Cruise, [place] Renal Navigator [phone]

## 2020-01-31 NOTE — H&P (Signed)
[facility] Admission History and Physical Service Pager: [phone]  Patient name: Rachel Chaney Medical record number: [id number] Date of birth: October 07, 1960 Age: 60 y.o. Gender: female  Primary Care Provider: Benay Pike, MD Consultants: Cardiology Code Status: Full Code  Preferred Emergency Contact: Vegas Coffin Husband [phone]  Chief Complaint: Hypoglycemia  Assessment and Plan: Rachel Chaney is a 60 y.o. female presenting with Hypoglycemia. PMH is significant for ESRD on Dialysis Mondays, Wednesdays and Fridays, OSA, DM Type 2 on Insulin, HFpEF, HTN, H/O MI (2020), and HLD.   Hypoglycemia Serum glucose found to be low following dialysis and again after returning home.Recent A1c 9.0 on 11/03.  Home medication Tresiba 50 u daily, last dose on 1/20.  On admisison to ED was found to have CBG of 44 which dropped to 33.  Received bolus of oral dextrose and blood sugars corrected to 100's.  CTA chest showed no PE but Multi segment atelectasis and air trapping and bronchopneumonia could easily be obscured due to the extent of pulmonary Opacification.  CT abdomen showed no acute finding.  Abdominal pain resolved and non-tender on exam.  Was given 1 dose of Ceftriaxone and Azithromycin due to concern for possible bacterial pneumonia.  Subsequently found to be COVID positive.  Hemodynamically stable at this time.  Symptoms thought likely to be due for COVID.  Will not plan to continue Antibiotics.   - Admit to Inpatient, Progressive, Attending Dr Gwendlyn Deutscher - Hold home Tyler Aas - Start on Lantus 20U - Start SSS insulin and monitor CBG's with meals and at night - Will not continue antibiotics - Plan to manage COVID as below   COVID Positive: Vaccinated, no booster, doesn't remember when, thinks Spring 2021.  Currently no symptoms outside of drop in blood glucose.  Is sattiung well on 2L [place] which is baseline. - Continuous CardiacandPulse Ox - Will start  Remdesevir for 3 day course - Keep FiO2 levels >88% - Tylenol 650 mg q6hr - Vital Signs q4hr - Daily CBC, CMP - PT/OT eval and treat  Elevated Trops Found to be elevated x2.  No previous Trops found.  Cards saw this morning and not concerned for ACS at this time. - Appreciate Cardiology recs - Continue to trend Trops - Obtain Echo - Repeat EKG if return in abdominal pain  ESRD on dialysis Mon, Wed, Fri Reports no missed dialysis sessions.  Will reach out to Nephro about for planned dialysis on Monday. -Will reach out to Nephrology  Anemia Stable: Likely secondary to ESRDHgb-10.3 -Transfusion threshold <8 given H/O CAD - Daily CBC  HFpEF Stable: Last ECHO 02/20.  Seen by Cardiology  OSA Uses CPAP at home.   - Continue CPAP.  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 - Continue Flexeril as needed  HLD Stable: home medication Atorvastatin 80 mg -Continue home medication daily  FEN/GI: Renal/Carb Modified Prophylaxis: Heparin  Disposition: Progressive  History of Present Illness:  Rachel Chaney is a 60 y.o. female presenting with Hypoglycemia.  She was at dialysis yesterday and found to have low blood sugar.  Was given fluids with sugar and went home.  Checkrf blood glucose at home and was found to have low blood sugar again.  Level did not improve with drinking orange juice so came to ED.  Only symptom she had with this was brief abdominal pain.  Denies any chest pain, palpitations, or lightheadedness.  Denies any recent sick contacts.  On Tresiba 50U nightly at home  ahd reports compliance.  Last took dose on 1/20.  No n/v/d.  Had BM yesterday.  Has conbtinued to have good appetite and oral intake.  Has had cough that started about 2 weeks ago.  Denies anything coming up.    Review Of Systems: Per HPI with the following additions:  Review of Systems  Constitutional: Negative for activity change, appetite change, fatigue and fever.  HENT: Negative for  congestion.   Respiratory: Positive for cough. Negative for shortness of breath.   Cardiovascular: Negative for chest pain and palpitations.  Gastrointestinal: Positive for abdominal pain. Negative for constipation, diarrhea, nausea and rectal pain.  Neurological: Negative for weakness.  Psychiatric/Behavioral: Negative for confusion.     Patient Active Problem List   Diagnosis Date Noted  . 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 Blurred vision 01/28/2020  . Rash and nonspecific skin eruption 01/15/2020  . Infestation by bed bug 01/15/2020  . Xerosis of skin 01/06/2020  . Hemorrhoids 11/25/2019  . Papular rash 11/25/2019  . Obstructive sleep apnea 11/12/2019  . Hypercapnic respiratory failure ([place]) 11/12/2019  . Seizure-like activity ([place])   . S/P transmetatarsal amputation of foot, right ([place]) 04/24/2018  . PAF (paroxysmal atrial fibrillation) ([place])   . Oxygen dependent   . Type 2 diabetes mellitus with diabetic neuropathy ([place])   . Severe protein-calorie malnutrition ([place])   . Seasonal allergies 04/12/2017  . Post-menopausal bleeding 12/07/2015  . ESRD (end stage renal disease) on dialysis ([place]) 05/25/2015  . Hypoalbuminemia   . Leukocytosis 05/17/2013  . Abnormal appearance of cervix 08/08/2011  . Back pain 08/07/2011  . GERD (gastroesophageal reflux disease) 04/27/2010  . Hyperlipidemia 03/08/2006  . Morbid obesity ([place]) 03/08/2006  . Essential hypertension with morbid obesity 03/08/2006    Past Medical History: Past Medical History:  Diagnosis Date  . Acute respiratory failure with hypoxia ([place])   . Cardiac arrest ([place]) 02/28/2018   while on hemodialysis in hospital  . CHF (congestive heart failure) ([place])   . Chronic kidney disease due to diabetes mellitus ([place]) 10/13/2013  . Community acquired bilateral lower lobe pneumonia 11/11/2019  . Diabetes mellitus    Type II  . ESRD (end stage renal disease) ([place])    MWF - [facility]  . Excess fluid volume 12/16/2019  .  Hyperlipidemia   . Hypertension   . Neuropathy     Past Surgical History: Past Surgical History:  Procedure Laterality Date  . AMPUTATION Right 02/27/2018   Procedure: RIGHT FOOT 2ND AND 3RD RAY AMPUTATION;  Surgeon: [name], MD;  Location: [place];  Service: Orthopedics;  Laterality: Right;  . AMPUTATION Right 04/24/2018   Procedure: RIGHT TRANSMETATARSAL AMPUTATION, PLACE VAC;  Surgeon: [name], MD;  Location: [place];  Service: Orthopedics;  Laterality: Right;  . AV FISTULA PLACEMENT Left 04/15/2015   Procedure: ARTERIOVENOUS (AV) FISTULA CREATION;  Surgeon: Mal Misty, MD;  Location: [place];  Service: Vascular;  Laterality: Left;  . EYE SURGERY Right    retinal detachment  . FISTULA SUPERFICIALIZATION Left 06/03/2015   Procedure: LEFT UPPER ARM BRACHIOCEPHALIC FISTULA SUPERFICIALIZATION;  Surgeon: Mal Misty, MD;  Location: [place];  Service: Vascular;  Laterality: Left;  from [place] to General  . STUMP REVISION Right 05/24/2018   Procedure: REVISION RIGHT TRANSMETATARSAL AMPUTATION WITH EXCISION BONE/SOFT TISSUE, LOCAL TISSUE  RE-ARRANGEMENT  AND VAC PLACEMENT;  Surgeon: [name], MD;  Location: [place];  Service: Orthopedics;  Laterality: Right;    Social History: Social History  Tobacco Use  . Smoking status: Never Smoker  . Smokeless tobacco: Never Used  Vaping Use  . Vaping Use: Never used  Substance Use Topics  . Alcohol use: No    Alcohol/week: 0.0 standard drinks  . Drug use: No   Additional social history: Please also refer to relevant sections of EMR.  Family History: Family History  Problem Relation Age of Onset  . Heart disease Mother   . Heart disease Father    Allergies and Medications: No Known Allergies No current facility-administered medications on file prior to encounter.   Current Outpatient Medications on File Prior to Encounter  Medication Sig Dispense Refill  . acetaminophen (TYLENOL) 500 MG tablet Take 1,000 mg by mouth every 6  (six) hours as needed for mild pain or headache.     Marland Kitchen aspirin EC 81 MG tablet Take 1 tablet (81 mg total) by mouth daily. 90 tablet 3  . atorvastatin (LIPITOR) 80 MG tablet TAKE 1 TABLET(80 MG) BY MOUTH AT BEDTIME (Patient taking differently: Take 80 mg by mouth at bedtime.) 90 tablet 3  . Blood Glucose Monitoring Suppl (ONE TOUCH ULTRA 2) w/Device KIT 1 kit by Does not apply route 3 (three) times daily. ICD-10 code: E11.40 1 each 0  . cetirizine (ZYRTEC) 10 MG tablet Take 10 mg by mouth as needed for allergies.     . cyclobenzaprine (FLEXERIL) 10 MG tablet TAKE 1 TABLET(10 MG) BY MOUTH THREE TIMES DAILY BETWEEN MEALS AND AT BEDTIME AS NEEDED FOR MUSCLE SPASMS (Patient taking differently: Take 10 mg by mouth 3 (three) times daily as needed for muscle spasms.) 30 tablet 0  . Doxepin HCl 5 % CREA Apply to affected area up to 4 times a day as needed for up to eight days. 30 g 1  . Etelcalcetide HCl (PARSABIV IV) Inject into the vein.     . ferric citrate (AURYXIA) 1 GM 210 MG(Fe) tablet Take 2 tablets (420 mg total) by mouth 3 (three) times daily with meals. Give two (420 mg) by mouth three times a day wih meals for ESRD (Patient taking differently: Take 210-420 mg by mouth See admin instructions. Take 420 mg with each meal and 210 mg with snacks) 180 tablet 0  . fluticasone (FLONASE) 50 MCG/ACT nasal spray Place 1 spray into both nostrils daily. (Patient taking differently: Place 1 spray into both nostrils daily as needed.) 16 g 5  . glucose blood (ONE TOUCH ULTRA TEST) test strip 1 each by Other route 3 (three) times daily. ICD-10 code: E11.40. 100 each 0  . hydrOXYzine (ATARAX/VISTARIL) 10 MG tablet Take 1 tablet (10 mg total) by mouth daily as needed for itching. 15 tablet 0  . insulin degludec (TRESIBA FLEXTOUCH) 100 UNIT/ML FlexTouch Pen Inject 50 Units into the skin in the morning. 27 mL 0  . Insulin Pen Needle (B-D UF III MINI PEN NEEDLES) 31G X 5 MM MISC USE AS DIRECTED TWICE DAILY 200 each 0  .  Insulin Pen Needle 29G X 12MM MISC Inject 1 pen into the skin 2 (two) times daily. Check blood sugar daily. 100 each 0  . Insulin Syringe-Needle U-100 (ULTICARE INSULIN SYRINGE) 30G X 5/16" 1 ML MISC USE AS DIRECTED 100 each 0  . multivitamin (RENA-VIT) TABS tablet Take 1 tablet by mouth daily.    Glory Rosebush Delica Lancets 82N MISC 1 Device by Does not apply route as needed (to check blood glucose). 100 each 0  . traMADol (ULTRAM) 50 MG tablet Take 1  tablet (50 mg total) by mouth every 12 (twelve) hours as needed for severe pain. 60 tablet 0  . nystatin ointment (MYCOSTATIN) Apply 1 application topically 2 (two) times daily. (Patient not taking: Reported on 01/31/2020)    . triamcinolone ointment (KENALOG) 0.5 % Apply 1 application topically 2 (two) times daily. (Patient not taking: Reported on 01/31/2020)    . [DISCONTINUED] progesterone (PROMETRIUM) 200 MG capsule Take 1 capsule (200 mg total) by mouth daily. Take for 10 days if bleeding occurs for more than 4 days. 10 capsule 1    Objective: BP (!) 145/62   Pulse 89   Temp (!) 97.5 F (36.4 C) (Oral)   Resp 14   Ht _0  (1.626 m)   LMP 09/18/2011   SpO2 97%   BMI 38.31 kg/m  Exam:  Physical Exam Constitutional:      General: She is not in acute distress. HENT:     Head: Normocephalic and atraumatic.  Cardiovascular:     Rate and Rhythm: Normal rate and regular rhythm.     Heart sounds: Normal heart sounds.     Comments: Pulses thready Pulmonary:     Effort: Pulmonary effort is normal. No respiratory distress.     Comments: Crackles in bases, good air movement Chest:     Chest wall: No tenderness.  Abdominal:     General: Abdomen is flat. Bowel sounds are normal. There is no distension.     Palpations: Abdomen is soft.     Tenderness: There is no abdominal tenderness.  Skin:    General: Skin is warm.  Neurological:     General: No focal deficit present.     Mental Status: She is alert and oriented to person, place, and  time.     Labs and Imaging: CBC BMET  Recent Labs  Lab 01/31/20 0142  WBC 7.5  HGB 10.3*  HCT 35.2*  PLT 153   Recent Labs  Lab 01/31/20 0142  NA 135  K 3.7  CL 91*  CO2 30  BUN 21  CREATININE 5.82*  GLUCOSE 192*  CALCIUM 8.3*     EKG: My own interpretation (not copied from electronic read)     Delora Fuel, MD 01/31/2020, 5:41 AM PGY-1, [place] Intern pager: [phone], text pages welcome

## 2020-01-31 NOTE — ED Notes (Signed)
Pt with borderline low blood sugar, Meal and orange juice given.

## 2020-01-31 NOTE — ED Notes (Signed)
Low blood sugar, pt given orange juice. Pt also eating lunch at this time.

## 2020-02-01 ENCOUNTER — Other Ambulatory Visit: Payer: Self-pay

## 2020-02-01 ENCOUNTER — Observation Stay (HOSPITAL_BASED_OUTPATIENT_CLINIC_OR_DEPARTMENT_OTHER): Payer: Medicare Other

## 2020-02-01 ENCOUNTER — Other Ambulatory Visit (HOSPITAL_COMMUNITY): Payer: Medicare Other

## 2020-02-01 ENCOUNTER — Encounter (HOSPITAL_COMMUNITY): Payer: Self-pay | Admitting: Family Medicine

## 2020-02-01 DIAGNOSIS — I350 Nonrheumatic aortic (valve) stenosis: Secondary | ICD-10-CM | POA: Diagnosis not present

## 2020-02-01 DIAGNOSIS — U071 COVID-19: Secondary | ICD-10-CM | POA: Diagnosis not present

## 2020-02-01 DIAGNOSIS — E162 Hypoglycemia, unspecified: Secondary | ICD-10-CM | POA: Diagnosis not present

## 2020-02-01 DIAGNOSIS — I361 Nonrheumatic tricuspid (valve) insufficiency: Secondary | ICD-10-CM | POA: Diagnosis not present

## 2020-02-01 DIAGNOSIS — I340 Nonrheumatic mitral (valve) insufficiency: Secondary | ICD-10-CM

## 2020-02-01 LAB — BASIC METABOLIC PANEL
Anion gap: 15 (ref 5–15)
BUN: 28 mg/dL — ABNORMAL HIGH (ref 8–23)
CO2: 27 mmol/L (ref 22–32)
Calcium: 8.5 mg/dL — ABNORMAL LOW (ref 8.9–10.3)
Chloride: 93 mmol/L — ABNORMAL LOW (ref 98–111)
Creatinine, Ser: 7.5 mg/dL — ABNORMAL HIGH (ref 0.44–1.00)
GFR, Estimated: 6 mL/min — ABNORMAL LOW (ref >60)
Glucose, Bld: 84 mg/dL (ref 70–99)
Potassium: 4 mmol/L (ref 3.5–5.1)
Sodium: 135 mmol/L (ref 135–145)

## 2020-02-01 LAB — ECHOCARDIOGRAM COMPLETE
AR max vel: 1.24 cm2
AV Area VTI: 0.98 cm2
AV Area mean vel: 1.23 cm2
AV Mean grad: 13 mmHg
AV Peak grad: 25.2 mmHg
Ao pk vel: 2.51 m/s
Height: 64 in

## 2020-02-01 LAB — GLUCOSE, CAPILLARY
Glucose-Capillary: 64 mg/dL — ABNORMAL LOW (ref 70–99)
Glucose-Capillary: 69 mg/dL — ABNORMAL LOW (ref 70–99)
Glucose-Capillary: 71 mg/dL (ref 70–99)
Glucose-Capillary: 79 mg/dL (ref 70–99)
Glucose-Capillary: 80 mg/dL (ref 70–99)
Glucose-Capillary: 80 mg/dL (ref 70–99)
Glucose-Capillary: 85 mg/dL (ref 70–99)

## 2020-02-01 LAB — CBC
HCT: 34.5 % — ABNORMAL LOW (ref 36.0–46.0)
Hemoglobin: 10.5 g/dL — ABNORMAL LOW (ref 12.0–15.0)
MCH: 32 pg (ref 26.0–34.0)
MCHC: 30.4 g/dL (ref 30.0–36.0)
MCV: 105.2 fL — ABNORMAL HIGH (ref 80.0–100.0)
Platelets: 162 10*3/uL (ref 150–400)
RBC: 3.28 MIL/uL — ABNORMAL LOW (ref 3.87–5.11)
RDW: 17.2 % — ABNORMAL HIGH (ref 11.5–15.5)
WBC: 7.3 10*3/uL (ref 4.0–10.5)
nRBC: 0.3 % — ABNORMAL HIGH (ref 0.0–0.2)

## 2020-02-01 MED ORDER — INSULIN GLARGINE 100 UNIT/ML ~~LOC~~ SOLN
15.0000 [IU] | SUBCUTANEOUS | Status: DC
  Filled 2020-02-01: qty 0.15

## 2020-02-01 NOTE — Progress Notes (Addendum)
Family Medicine Teaching Service Daily Progress Note Intern Pager: [phone]  Patient name: Rachel Chaney Medical record number: [id number] Date of birth: January 16, 1960 Age: 60 y.o. Gender: female  Primary Care Provider: Benay Pike, MD Consultants: Nephrology Code Status: Full  Pt Overview and Major Events to Date:  1/22-admitted  Assessment and Plan: Rachel Chaney is a 60 y.o. female presenting with Hypoglycemia. PMH is significant for ESRD on Dialysis Mondays, Wednesdays and Fridays, OSA, DM Type 2 on Insulin, HFpEF, HTN, H/O MI (2020), and HLD.   T2DM with hypoglycemia Serum glucose found to be low following dialysis and again after returning home. Recent A1c 9.0 on 11/03. Home medication Tresiba 50 u daily, per patient she was taking this nightly. Glucose range over last 24 hours of 61-79.  Currently holding long-acting insulin. -Continue to monitor CBGs -Sliding scale insulin -Will not give any long-term acting insulin at this time  COVID Positive: Vaccinated, no booster.  Currently asymptomatic other than hypoglycemia.  Currently satting well on 4 L nasal cannula. - Continuous CardiacandPulse Ox -  Remdesivir day 2 of 3 - Keep FiO2 levels >88% -We will attempt to wean O2 to patient's home oxygen level of 2 L  Elevated Trops Found to be elevated x2.  No previous Trops found.    Per cardiology not concerned for ACS at this time but can continue to monitor and if patient develops abdominal pain will repeat troponin and EKG.  We will get echo per cardiology recommendations. - Appreciate Cardiology recs  -Continue ASA 81 mg indefinitely  -Continue high intensity statin  -Echo scheduled for today, 1/23  -If patient develops abdominal pain will repeat troponin and EKG  -Plan for myocardial perfusion imaging as outpatient  ESRD on dialysis Mon, Wed, Fri Reports no missed dialysis sessions.  Nephrology on board.  Next dialysis planned for 1/24. -Will reach out to  Nephrology  Anemia Stable: Likely secondary to ESRD.  A.m. hemoglobin 10.5. -Transfusion threshold <8 given H/O CAD - Daily CBC  HFpEF Stable: Last ECHO 02/20. -We will repeat echo as above  OSA Uses CPAP at home.  Was not able to get CPAP last night due to her COVID-19 status.  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 - Continue Flexeril as needed  HLD Stable: home medication Atorvastatin 80 mg -Continue home medication daily   FEN/GI: Renal/carb modified PPx: Heparin  Status is: Inpatient  The patient will require care spanning > 2 midnights and should be moved to inpatient because: Inpatient level of care appropriate due to severity of illness  Dispo: The patient is from: Home              Anticipated d/c is to: Home              Anticipated d/c date is: 2 days              Patient currently is not medically stable to d/c.   Difficult to place patient No  Subjective:  Patient with no complaints this morning.  States she was not able to get CPAP last night but did all right on 4 L nasal cannula.  She states her home O2 requirement is 2 L nasal cannula.    She states she does take her Tresiba 50 units nightly and is not sure why her blood sugar levels have been so low since she has been here even though we are  not giving her any long-acting insulin. On chart review it appears she refused her Lantus stating that she has not taken it in over a month.  Objective: Temp:  [99.2 F (37.3 C)-100.1 F (37.8 C)] 99.2 F (37.3 C) (01/23 0336) Pulse Rate:  [82-119] 86 (01/23 0336) Resp:  [13-31] 18 (01/23 0336) BP: (78-148)/(38-131) 100/59 (01/23 0336) SpO2:  [72 %-100 %] 90 % (01/23 0336) Physical Exam: General: Alert and oriented in no apparent distress Heart: Regular rate and rhythm Lungs: CTA bilaterally, nasal cannula in place on 4 L Skin: Warm and dry Extremities: No lower extremity  edema  Laboratory: Recent Labs  Lab 01/31/20 0142 02/01/20 0150  WBC 7.5 7.3  HGB 10.3* 10.5*  HCT 35.2* 34.5*  PLT 153 162   Recent Labs  Lab 01/31/20 0142 02/01/20 0150  NA 135 135  K 3.7 4.0  CL 91* 93*  CO2 30 27  BUN 21 28*  CREATININE 5.82* 7.50*  CALCIUM 8.3* 8.5*  PROT 5.8*  --   BILITOT 1.1  --   ALKPHOS 60  --   ALT 23  --   AST 27  --   GLUCOSE 192* [address] DO 02/01/2020, 7:16 AM PGY-2, [place] Intern pager: [phone], text pages welcome

## 2020-02-01 NOTE — Evaluation (Signed)
Occupational Therapy Evaluation Patient Details Name: Rachel Chaney MRN: [id number] DOB: 1960-10-04 Today's Date: 02/01/2020    History of Present Illness Morning I Behan is a 60 y.o. female presenting with Hypoglycemia. Pt also found to be COVID+ requiring increased supplemental oxygen. PMH is significant for ESRD on Dialysis Mondays, Wednesdays and Fridays, OSA, DM Type 2 on Insulin, HFpEF, HTN, H/O MI (2020), and HLD.   Clinical Impression   PTA, pt lives with spouse and reports Modified Independence with ADLs and mobility using cane or Rollator. Pt wears 2 L O2 at baseline. Pt presents now with deficits in cardiopulmonary tolerance, strength, dynamic standing balance and cognition. Pt overall min guard for simple transfers using RW with cues for safe sequencing. Pt Setup for UB ADLs and Min A for LB ADLs due to deficits. Pt noted with some decreased attention and memory deficits during session. Provided energy conservation education (with handout) to maximize safety with ADLs at home. Plan to further progress ADL mobility, assess higher level cognitive tasks at acute level with skilled OT services. Pt reports her husband is retired and home with her 24/7. As long as 24/7 able to be provided at home, recommend HHOT follow-up. If 24/7 not able to be provided, consider SNF for short term rehab.   Pt received on 4 L O2, SpO2 95% at rest. Variable O2 readings (78%, 80s) during activities with finger probe use. With application and adjustment of ear probe, pt reading 98% at rest on 4 L O2. Pt denied SOB during tasks.     Follow Up Recommendations  Home health OT;Supervision/Assistance - 24 hour (if 24/7 unable to be provided, consider SNF)    Equipment Recommendations  3 in 1 bedside commode    Recommendations for Other Services       Precautions / Restrictions Precautions Precautions: Fall;Other (comment) Precaution Comments: monitor O2 Restrictions Weight Bearing Restrictions: No       Mobility Bed Mobility Overal bed mobility: Needs Assistance Bed Mobility: Supine to Sit     Supine to sit: HOB elevated;Supervision     General bed mobility comments: Supervision to ensure safety, no physical assist needed. Use of bedrail    Transfers Overall transfer level: Needs assistance Equipment used: Rolling walker (2 wheeled) Transfers: Sit to/from [facility] Sit to Stand: Min guard Stand pivot transfers: Min guard       General transfer comment: min guard for sit to stand, cues needed for hand placement as pt with tendency to pull on walker. min guard for stability in transitoning to chair with RW, cues for manuevering RW.    Balance Overall balance assessment: Needs assistance Sitting-balance support: Feet supported;Single extremity supported Sitting balance-Leahy Scale: Fair     Standing balance support: Bilateral upper extremity supported;During functional activity Standing balance-Leahy Scale: Poor Standing balance comment: reliant on UE support for dynamic tasks, decreasing steadiness with only one UE support                           ADL either performed or assessed with clinical judgement   ADL Overall ADL's : Needs assistance/impaired Eating/Feeding: Set up;Sitting Eating/Feeding Details (indicate cue type and reason): setup for breakfast Grooming: Set up;Sitting   Upper Body Bathing: Set up;Sitting   Lower Body Bathing: Minimal assistance;Sit to/from stand   Upper Body Dressing : Set up;Sitting   Lower Body Dressing: Minimal assistance;Sit to/from stand   Toilet Transfer: Scientist, product/process development Details (indicate cue  type and reason): simulated to recliner Toileting- Clothing Manipulation and Hygiene: Minimal assistance;Sit to/from stand         General ADL Comments: Pt with deficits in cardiopulmonary tolerance, cognition and standing balance with limited assist needed for safe completion  of ADLs     Vision Baseline Vision/History: Wears glasses Wears Glasses: At all times Patient Visual Report: No change from baseline Vision Assessment?: No apparent visual deficits     Perception     Praxis      Pertinent Vitals/Pain Pain Assessment: No/denies pain     Hand Dominance Right   Extremity/Trunk Assessment Upper Extremity Assessment Upper Extremity Assessment: Generalized weakness   Lower Extremity Assessment Lower Extremity Assessment: Defer to PT evaluation   Cervical / Trunk Assessment Cervical / Trunk Assessment: Normal   Communication Communication Communication: No difficulties   Cognition Arousal/Alertness: Awake/alert Behavior During Therapy: WFL for tasks assessed/performed Overall Cognitive Status: No family/caregiver present to determine baseline cognitive functioning Area of Impairment: Attention;Memory;Following commands;Safety/judgement;Problem solving                   Current Attention Level: Selective Memory: Decreased short-term memory Following Commands: Follows one step commands with increased time Safety/Judgement: Decreased awareness of safety;Decreased awareness of deficits   Problem Solving: Slow processing;Difficulty sequencing;Requires verbal cues;Requires tactile cues General Comments: Pt noted with decreased attention and some confusion. Pt perseverating on locating drivers license and credit card, but could not remember where she placed them (was under her in the bed). Pt oriented to reason for hospitalization, benefits from safety cues for use of DME. Some tangential comments noted but able to follow one step commands. Further higher level cognitive assessments indicated in future sessions.   General Comments  Pt recieved on 4 L O2 (baseline is 2 L O2) with SpO2 95% at rest in bed. Pt with finger probe initially with difficulty reading due to artificial nails/nail color. After transfer, SpO2 reading 78%, 80s but unreliable  pleth. Trialed ear probe with adjustment needed and good pleth noted once seated and at rest - 98% on 4 L O2 in chair. Pt denied SOB during transfer. Coordinated with RN for O2 extension cord to allow for further movement in room. Provided energy conservation handout with education on strategies for ADLs at home. Instructed in IS use with pt able to pull 727mL. Coordinated with RN with OT dropping off flutter valve for pt use while RN in room.    Exercises     Shoulder Instructions      Home Living Family/patient expects to be discharged to:: Private residence Living Arrangements: Spouse/significant other Available Help at Discharge: Family;Available 24 hours/day Type of Home: House Home Access: Stairs to enter [employer] of Steps: 4 Entrance Stairs-Rails: Right Home Layout: One level     Bathroom Shower/Tub: Teacher, early years/pre: Standard     Home Equipment: Environmental consultant - 2 wheels;Cane - single point;Grab bars - tub/shower;Wheelchair - Insurance claims handler - 4 wheels;Hand held shower head          Prior Functioning/Environment Level of Independence: Independent with assistive device(s)        Comments: Pt reports using cane vs Rollator for mobility. Reports able to complete ADLs at home, uses shower chair for showering tasks. Pt and spouse share IADLs        OT Problem List: Decreased strength;Decreased activity tolerance;Impaired balance (sitting and/or standing);Decreased cognition;Decreased safety awareness;Decreased knowledge of use of DME or AE;Cardiopulmonary status limiting activity  OT Treatment/Interventions: Therapeutic exercise;Self-care/ADL training;Energy conservation;DME and/or AE instruction;Therapeutic activities;Balance training;Patient/family education    OT Goals(Current goals can be found in the care plan section) Acute Rehab OT Goals Patient Stated Goal: go home today, find my license OT Goal Formulation: With  patient Time For Goal Achievement: 02/15/20 Potential to Achieve Goals: Good ADL Goals Pt Will Perform Grooming: with modified independence;standing Pt Will Perform Lower Body Bathing: sitting/lateral leans;sit to/from stand;with modified independence Pt Will Perform Lower Body Dressing: with modified independence;sitting/lateral leans;sit to/from stand Pt Will Transfer to Toilet: with modified independence;ambulating;regular height toilet Pt Will Perform Toileting - Clothing Manipulation and hygiene: with modified independence;sitting/lateral leans;sit to/from stand Pt/caregiver will Perform Home Exercise Program: Increased strength;Both right and left upper extremity;With theraband;Independently;With written HEP provided Additional ADL Goal #1: Pt to demonstrate ability to monitor SpO2 and implement pursed lip breathing independently to maintain O2 >88% Additional ADL Goal #2: Pt to complete 2-3 step trail making task without verbal cues  OT Frequency: Min 2X/week   Barriers to D/C:            Co-evaluation              AM-PAC OT "6 Clicks" Daily Activity     Outcome Measure Help from another person eating meals?: A Little Help from another person taking care of personal grooming?: A Little Help from another person toileting, which includes using toliet, bedpan, or urinal?: A Little Help from another person bathing (including washing, rinsing, drying)?: A Little Help from another person to put on and taking off regular upper body clothing?: A Little Help from another person to put on and taking off regular lower body clothing?: A Little 6 Click Score: 18   End of Session Equipment Utilized During Treatment: Rolling walker;Oxygen Nurse Communication: Mobility status;Other (comment) (O2, cognition)  Activity Tolerance: Patient tolerated treatment well Patient left: in chair;with call bell/phone within reach;Other (comment) (Neither RN nor OT able to locate chair alarm for pt  use. Secretary ordering more fall prevention bags)  OT Visit Diagnosis: Unsteadiness on feet (R26.81);Other abnormalities of gait and mobility (R26.89);Muscle weakness (generalized) (M62.81)                Time: 6387-5643 OT Time Calculation (min): 39 min Charges:  OT General Charges $OT Visit: 1 Visit OT Evaluation $OT Eval Moderate Complexity: 1 Mod OT Treatments $Self Care/Home Management : 8-22 mins $Therapeutic Activity: 8-22 mins  Layla Maw, OTR/L  Layla Maw 02/01/2020, 10:36 AM

## 2020-02-01 NOTE — Progress Notes (Signed)
Notified on call Family med Dr of patients low blood sugar of 64, now 71 after receiving juice. Also, increase in oxygen. Upon arrival level 86% on 3L.  New orders given to continue to monitor blood sugar, and place patient on cpap for night time.

## 2020-02-01 NOTE — Hospital Course (Signed)
   Follow-up task: -CT abdomen: 12 mm liver nodule and radiology recommended follow-up CT or MRI in 3-6 months.

## 2020-02-01 NOTE — Evaluation (Signed)
Physical Therapy Evaluation Patient Details Name: Rachel Chaney MRN: [id number] DOB: [birth year] Today's Date: 02/01/2020   History of Present Illness  Rachel Chaney is a 60 y.o. female presenting with Hypoglycemia. Pt also found to be COVID+ requiring increased supplemental oxygen. PMH is significant for ESRD on Dialysis Mondays, Wednesdays and Fridays, OSA, DM Type 2 on Insulin, HFpEF, HTN, H/O MI (2020), and HLD.  Clinical Impression  Pt presents with mild dependencies in gait and mobility, mostly limited by mild balance deficits.  Patient min guard to ambulate with RW and able to self-regain balance during gait.  Feel patient able to discharge home with support and RW.  I do recommend HHPT to progress mobility and assist patient in returning to baseline function.    Follow Up Recommendations Home health PT    Equipment Recommendations  None recommended by PT    Recommendations for Other Services       Precautions / Restrictions Precautions Precautions: Fall;Other (comment) Precaution Comments: monitor O2      Mobility  Bed Mobility Overal bed mobility: Modified Independent Bed Mobility: Sit to Supine       Sit to supine: Modified independent (Device/Increase time)   General bed mobility comments: patient sitting EOB upon arrival.  At end of session, pt returned to supine using bedrails, able to reposition self in bed    Transfers Overall transfer level: Needs assistance Equipment used: Rolling walker (2 wheeled)   Sit to Stand: Min guard         General transfer comment: min guard for sit to stand for balance, cues needed for hand placement as pt with tendency to pull on walker to stand  Ambulation/Gait Ambulation/Gait assistance: Min guard Gait Distance (Feet): 30 Feet Assistive device: Rolling walker (2 wheeled) Gait Pattern/deviations: Step-through pattern Gait velocity: decreased   General Gait Details: min guard assist for safety; pt with slight  loss of balance on turn with RW but able to self-regain  Stairs            Wheelchair Mobility    Modified Rankin (Stroke Patients Only)       Balance Overall balance assessment: Needs assistance Sitting-balance support: No upper extremity supported;Feet unsupported Sitting balance-Leahy Scale: Fair     Standing balance support: Bilateral upper extremity supported;During functional activity Standing balance-Leahy Scale: Poor Standing balance comment: reliant on UE support for balance                             Pertinent Vitals/Pain Pain Assessment: No/denies pain    Home Living                        Prior Function                 Hand Dominance        Extremity/Trunk Assessment        Lower Extremity Assessment Lower Extremity Assessment: Generalized weakness       Communication      Cognition Arousal/Alertness: Awake/alert Behavior During Therapy: WFL for tasks assessed/performed Overall Cognitive Status: No family/caregiver present to determine baseline cognitive functioning                                        General Comments      Exercises     Assessment/Plan  PT Assessment All further PT needs can be met in the next venue of care  PT Problem List Decreased activity tolerance;Decreased balance;Decreased mobility       PT Treatment Interventions      PT Goals (Current goals can be found in the Care Plan section)  Acute Rehab PT Goals PT Goal Formulation: All assessment and education complete, DC therapy    Frequency     Barriers to discharge        Co-evaluation               AM-PAC PT "6 Clicks" Mobility  Outcome Measure Help needed turning from your back to your side while in a flat bed without using bedrails?: None Help needed moving from lying on your back to sitting on the side of a flat bed without using bedrails?: A Little Help needed moving to and from a bed to a  chair (including a wheelchair)?: A Little Help needed standing up from a chair using your arms (e.g., wheelchair or bedside chair)?: A Little Help needed to walk in hospital room?: A Little Help needed climbing 3-5 steps with a railing? : A Little 6 Click Score: 19    End of Session Equipment Utilized During Treatment: Gait belt Activity Tolerance: Patient tolerated treatment well Patient left: in bed;with call bell/phone within reach Nurse Communication: Mobility status PT Visit Diagnosis: Unsteadiness on feet (R26.81)    Time: 1330-1350 PT Time Calculation (min) (ACUTE ONLY): 20 min   Charges:   PT Evaluation $PT Eval Moderate Complexity: 1 Mod          02/01/2020 Margie, PT Acute Rehabilitation Services Pager:  [phone] Office:  [phone]    Shanna Cisco 02/01/2020, 2:46 PM

## 2020-02-01 NOTE — Progress Notes (Signed)
AVS given and reviewed with pt. Medications discussed. All questions answered to satisfaction. Pt verbalized understanding of information given. Pt escorted off the unit with all belongings via wheelchair by this RN.  

## 2020-02-01 NOTE — Progress Notes (Addendum)
[facility] Progress Note   Subjective:  Seen in room - remains on nasal O2, denies dyspnea. Her main concern today is her vision, tells me it has been worse recently, but has eye appt already schedule for next week. Didn't eat much this morning. Getting dose #2 remdesivir.  Objective Vitals:   01/31/20 2200 01/31/20 2232 01/31/20 2349 02/01/20 0336  BP: (!) 124/41 (!) 110/43 (!) 114/42 (!) 100/59  Pulse: 89 91 92 86  Resp:  16 20 18   Temp:  99.5 F (37.5 C) 100.1 F (37.8 C) 99.2 F (37.3 C)  TempSrc:  Oral Oral Oral  SpO2: (!) 81% 99% 90% 90%  Height:       Physical Exam General: Well appearing woman, NAD. Nasal O2 in place. Heart: RRR; no murmur Lungs: CTAB Extremities: Trace LE edema; firm patchy areas along entire R leg Dialysis Access: LUE AVF + bruit  Additional Objective Labs: Basic Metabolic Panel: Recent Labs  Lab 01/31/20 0142 02/01/20 0150  NA 135 135  K 3.7 4.0  CL 91* 93*  CO2 30 27  GLUCOSE 192* 84  BUN 21 28*  CREATININE 5.82* 7.50*  CALCIUM 8.3* 8.5*   Liver Function Tests: Recent Labs  Lab 01/31/20 0142  AST 27  ALT 23  ALKPHOS 60  BILITOT 1.1  PROT 5.8*  ALBUMIN 2.6*   Recent Labs  Lab 01/31/20 0142  LIPASE 19   CBC: Recent Labs  Lab 01/31/20 0142 02/01/20 0150  WBC 7.5 7.3  NEUTROABS 6.5  --   HGB 10.3* 10.5*  HCT 35.2* 34.5*  MCV 107.0* 105.2*  PLT 153 162   Blood Culture    Component Value Date/Time   SDES BLOOD RIGHT ANTECUBITAL 12/16/2019 1904   SPECREQUEST  12/16/2019 1904    BOTTLES DRAWN AEROBIC AND ANAEROBIC Blood Culture adequate volume   CULT  12/16/2019 1904    NO GROWTH 5 DAYS Performed at [facility], [place] [address], [place], [place] [address]    REPTSTATUS 12/21/2019 FINAL 12/16/2019 1904   Studies/Results: CT ANGIO CHEST PE W OR WO CONTRAST  Result Date: 01/31/2020 CLINICAL DATA:  Abdominal pain after dialysis. Pulmonary embolism suspected EXAM: CT ANGIOGRAPHY CHEST WITH  CONTRAST TECHNIQUE: Multidetector CT imaging of the chest was performed using the standard protocol during bolus administration of intravenous contrast. Multiplanar CT image reconstructions and MIPs were obtained to evaluate the vascular anatomy. CONTRAST:  1104mL OMNIPAQUE IOHEXOL 350 MG/ML SOLN COMPARISON:  03/07/2018 FINDINGS: Cardiovascular: Cardiomegaly, especially dilatation of the right heart. Diffuse aortic and coronary atherosclerosis. Suboptimal CT of the pulmonary arteries primarily due to body habitus but also exacerbated by bolus diffusion. Aortic valvular calcification. No acute aortic finding. Mediastinum/Nodes: Negative for adenopathy or mass Lungs/Pleura: Bands of opacity with volume loss throughout the lungs. Lung volumes are generally low. Mosaic attenuation in the upper lung zones. No effusion or generalized interlobular septal thickening. Upper Abdomen: Reported separately Musculoskeletal: Exuberant osteophytes with multilevel thoracic ankylosis and costovertebral ankylosis. Remote and healed mid sternal fracture. Review of the MIP images confirms the above findings. IMPRESSION: 1. Suboptimal CTA. No evidence of pulmonary embolism to the segmental level. 2. Cardiomegaly, especially right heart dilatation. 3. Multi segment atelectasis and air trapping. Bronchopneumonia could easily be obscured due to the extent of pulmonary opacification. 4.  Aortic Atherosclerosis (ICD10-I70.0). Electronically Signed   By: Monte Fantasia M.D.   On: 01/31/2020 04:27   CT ABDOMEN PELVIS W CONTRAST  Result Date: 01/31/2020 CLINICAL DATA:  Nonlocalized abdominal  pain EXAM: CT ABDOMEN AND PELVIS WITH CONTRAST TECHNIQUE: Multidetector CT imaging of the abdomen and pelvis was performed using the standard protocol following bolus administration of intravenous contrast. CONTRAST:  149mL OMNIPAQUE IOHEXOL 350 MG/ML SOLN COMPARISON:  None. FINDINGS: Lower chest:  Reported separately Hepatobiliary: Somewhat  heterogeneous enhancement of the liver diffusely, possibly from patient's right heart dilatation. There is a more rounded vague nodule in the posterior right lobe measuring 12 mm on 5:14. No abnormality seen in this area in the arterial phase. This area is not covered on the delayed phase.Dependent high-density in the gallbladder compatible with sludge/small calculi. No evidence of acute cholecystitis. Pancreas: Unremarkable. Spleen: Unremarkable. Adrenals/Urinary Tract: Negative adrenals. No hydronephrosis or stone. Cystic densities in the bilateral kidney measuring up to 17 mm in the left lower pole. Poor renal enhancement bilaterally in the setting of end-stage renal disease. Unremarkable bladder. Stomach/Bowel:  No obstruction. No visible bowel inflammation Vascular/Lymphatic: Confluent arterial calcifications. No mass or adenopathy. Reproductive:Dystrophic and vascular uterine calcifications. Other: No ascites or pneumoperitoneum. Dependent skin thickening and anasarca. Presumed medication injection sites in the subcutaneous ventral abdomen. Musculoskeletal: No acute abnormalities. Subjectively sclerotic appearance of the bones but without subchondral erosions confirmatory of renal osteodystrophy. IMPRESSION: 1. No acute finding. 2.  Cholelithiasis/sludge without findings of acute cholecystitis. 3. Heterogeneous liver likely related to patient's dilated right heart, but there is also a vague 12 mm nodular appearance in the right lobe. Due to location and patient factors ultrasound follow-up is unlikely to be beneficial, recommend either multiphase CT or noncontrast MRI in 3-6 months, sooner if there is history of malignancy. Electronically Signed   By: Monte Fantasia M.D.   On: 01/31/2020 04:34   Medications: . remdesivir 100 mg in NS 100 mL 100 mg (02/01/20 1019)   . aspirin EC  81 mg Oral Daily  . atorvastatin  80 mg Oral QHS  . ferric citrate  420 mg Oral TID WC  . heparin  5,000 Units Subcutaneous  Q8H  . insulin aspart  0-9 Units Subcutaneous TID WC    Dialysis Orders: MWF @ [facility] 3:30hr, 450/800, EDW 97kg, 2K/2Ca, UFP #4, AVF, heparin 8000u bolus - Sensipar 30 mg PO q HD - Hectoral 39mcg IV q HD - Mircera 100 q 2 weeks - ordered, not yet given, Hgb 10.8 with tsat 9 on 01/28/20  Assessment/Plan: 1.  Hypoglycemia: Possibly d/t viral infection, home insulin lowered per primary, follow. 2.  Elevated troponin: Trop 267 -> 272, echo pending. Per cards. Hx cardiac arrest. 3.  COVID-19 infection: No pneumonia on CT - for 3d course of remdesivir. 4.  ESRD: Continue HD per MWF schedule. Next HD tomorrow (Monday) - if she remains stable and can be weaned to home O2 requirement, would be ideal if she could be discharged tomorrow in time to diayze as outpatient on isolation 3rd shift. But, of course, if she needs to remain hospitalized - will dialyze her here. 5.  Hypertension/volume: BP stable, trace LE edema. 6.  Anemia: Hgb 10.5 - start ESA with next HD 7.  Metabolic bone disease: CorrCa ok, Phos pending. Continue home binders 8.  R thigh firm areas: edema v. morphea v. fibrosis v. calciphylaxis. Follow.   Veneta Penton, PA-C 02/01/2020, 10:55 AM  [place]  Nephrology attending: Seen and examined at bedside.  Agree with above. Sitting on chair comfortable.  Increase oral intake.  Blood sugar level better.  Labs and volume status acceptable.  Maintenance HD tomorrow which can be done  outpatient if weaned from oxygen.  She will need HD order if remains in the hospital tomorrow.  Katheran James, MD [facility].

## 2020-02-01 NOTE — Discharge Instructions (Addendum)
You were admitted to the hospital with a low blood sugars. We are discharging you and holding your insulin currently. We have you set up for a follow-up appointment at our clinic, the [facility] with Dr. Pilar Plate at 2:50 PM on Tuesday 1/25. At that appointment he will discuss whether we should restart your insulin and at what dose. Please check your sugars each morning and ideally once or twice throughout the day before your appointment with Dr. Pilar Plate and bring these numbers to help him decide what dose of insulin to restart.   For outpatient COVID dialysis on Monday, Wednesday, Friday at your normal outpatient dialysis clinic from 4:30 PM until 7:30 PM. Your next appointment will be Monday, 02/02/2020 at 4:30 PM.

## 2020-02-01 NOTE — Progress Notes (Signed)
Paged by RN for CPAP order and glucose monitoring. Orders placed at time of admission. Notified patient was not placed in negative air pressure room, respiratory refusing to place CPAP at this time due to COVID status. Patient wears CPAP at night at home. Will attempt to maintain O2 sats with [place] at this time due to this issues.   Anniemae Haberkorn Autry-Lott, DO 02/01/2020, 12:44 AM PGY-2, [place]

## 2020-02-01 NOTE — Plan of Care (Signed)
  Problem: Clinical Measurements: Goal: Will remain free from infection Outcome: Progressing   Problem: Activity: Goal: Risk for activity intolerance will decrease Outcome: Progressing   Problem: Nutrition: Goal: Adequate nutrition will be maintained Outcome: Progressing   Problem: Pain Managment: Goal: General experience of comfort will improve Outcome: Progressing   Problem: Safety: Goal: Ability to remain free from injury will improve Outcome: Progressing   Problem: Skin Integrity: Goal: Risk for impaired skin integrity will decrease Outcome: Progressing   

## 2020-02-01 NOTE — Progress Notes (Addendum)
hsTrop remains mildly elevated and flat and likely related to COVID 19 infection in setting of ESRD.  Denies any chest pain.  EKG with no acute ischemic changes.  Await results of 2D echo for further recommendations.

## 2020-02-01 NOTE — Discharge Summary (Signed)
[facility] Discharge Summary  Patient name: Rachel Chaney Medical record number: [id number] Date of birth: 04/26/1960 Age: 60 y.o. Gender: female Date of Admission: 01/31/2020  Date of Discharge: 02/01/2020 Admitting Physician: Delora Fuel, MD  Primary Care Provider: Benay Pike, MD Consultants: Nephrology  Indication for Hospitalization: Hypoglycemia, COVID-19  Discharge Diagnoses/Problem List:  Active Problems:   Hypoglycemia   COVID-19  Disposition: Home  Discharge Condition: Stable  Discharge Exam: General: Alert and oriented in no apparent distress Heart: Regular rate and rhythm Lungs: CTA bilaterally, nasal cannula in place on 3 L Skin: Warm and dry Extremities: No lower extremity edema  Brief Hospital Course:  Rachel I Dorsettis a 60 y.o.femalepresenting with Hypoglycemia. PMH is significant forESRD on Dialysis Mondays, Wednesdays and Fridays,OSA,DM Type 2 on Insulin, HFpEF, HTN, H/O MI (2020), andHLD  T2DM with hypoglycemia: Serum glucose found to be low following dialysis and again after returning home leading to her presenting to the ED.Home medication of Tresiba 50 u daily, per patient she said she was taking this nightly, however she also told her nurse that she had not taken insulin in at least a month. Glucose range over last 24 hours of hospitalization of 61-79 without any insulin given. At time of discharge the patient's insulin was held for concern of hypoglycemia. The patient was scheduled for an appointment within a few days of discharge to discuss restarting her insulin at a lower dose if appropriate.  COVID Positive:Vaccinated, no booster. Currently asymptomatic other than hypoglycemia.   Patient continues satting well on her home oxygen of 2 to 3 L. She did receive 2 days of remdesivir but per rounds discussion and per attending we determined that she did not need to follow-up for a third dose as she had no  symptoms.  Elevated Trops Patient had elevated troponins on admission. Cardiology was consulted who was not concerned of ACS at this time and recommended continuing aspirin 81 mg indefinitely and continuing high-dose statin. The patient did have an echo on the day of discharge which showed left ventricular ejection fraction 60 to 65%, severe concentric left ventricular hypertrophy.  Right ventricular systolic function mildly reduced. On day of discharge I personally spoke to the cardiologist who stated that at this time no further work-up was needed after the echo and that the patient would be good to discharge from their standpoint.  ESRD on dialysis Mon, Wed, Fri Reports no missed dialysis sessions. At the time of discharge the patient was set up for outpatient dialysis at the [place] part of her dialysis clinic on Monday, Wednesday and Friday.  Issues for Follow Up:  1.) CT abdomen: 12 mm liver nodule and radiology recommended follow-up CT or MRI in 3-6 months.  2.) Patient's insulin held at time of discharge due to hypoglycemic episodes. Consider whether to restart the insulin or continue to hold it based off her  CBG in office and at home, patient should be bringing a list of blood glucose levels since her discharge.  3.) Recommend outpatient cardiology follow up  Significant Procedures: None  Significant Labs and Imaging:  Recent Labs  Lab 01/31/20 0142 02/01/20 0150  WBC 7.5 7.3  HGB 10.3* 10.5*  HCT 35.2* 34.5*  PLT 153 162   Recent Labs  Lab 01/31/20 0142 02/01/20 0150  NA 135 135  K 3.7 4.0  CL 91* 93*  CO2 30 27  GLUCOSE 192* 84  BUN 21 28*  CREATININE 5.82* 7.50*  CALCIUM 8.3* 8.5*  ALKPHOS 60  --   AST 27  --   ALT 23  --   ALBUMIN 2.6*  --       Results/Tests Pending at Time of Discharge:  Unresulted Labs (From admission, onward)          Start     Ordered   02/02/20 0500  Renal function panel  Tomorrow morning,   R        02/01/20 0858   02/01/20 0500   CBC  (heparin)  Daily,   R     Comments: Baseline for heparin therapy IF NOT ALREADY DRAWN.  Notify MD if PLT < 100 K.    01/31/20 0815           Discharge Medications:  Allergies as of 02/01/2020   No Known Allergies     Medication List    STOP taking these medications   Tresiba FlexTouch 100 UNIT/ML FlexTouch Pen Generic drug: insulin degludec     TAKE these medications   acetaminophen 500 MG tablet Commonly known as: TYLENOL Take 1,000 mg by mouth every 6 (six) hours as needed for mild pain or headache.   aspirin EC 81 MG tablet Take 1 tablet (81 mg total) by mouth daily.   atorvastatin 80 MG tablet Commonly known as: LIPITOR TAKE 1 TABLET(80 MG) BY MOUTH AT BEDTIME What changed: See the new instructions.   cetirizine 10 MG tablet Commonly known as: ZYRTEC Take 10 mg by mouth as needed for allergies.   cyclobenzaprine 10 MG tablet Commonly known as: FLEXERIL TAKE 1 TABLET(10 MG) BY MOUTH THREE TIMES DAILY BETWEEN MEALS AND AT BEDTIME AS NEEDED FOR MUSCLE SPASMS What changed: See the new instructions.   Doxepin HCl 5 % Crea Apply to affected area up to 4 times a day as needed for up to eight days.   ferric citrate 1 GM 210 MG(Fe) tablet Commonly known as: Auryxia Take 2 tablets (420 mg total) by mouth 3 (three) times daily with meals. Give two (420 mg) by mouth three times a day wih meals for ESRD What changed:   how much to take  when to take this  additional instructions   fluticasone 50 MCG/ACT nasal spray Commonly known as: FLONASE Place 1 spray into both nostrils daily. What changed:   when to take this  reasons to take this   glucose blood test strip Commonly known as: ONE TOUCH ULTRA TEST 1 each by Other route 3 (three) times daily. ICD-10 code: E11.40.   hydrOXYzine 10 MG tablet Commonly known as: ATARAX/VISTARIL Take 1 tablet (10 mg total) by mouth daily as needed for itching.   Insulin Pen Needle 29G X 12MM Misc Inject 1 pen into  the skin 2 (two) times daily. Check blood sugar daily.   B-D UF III MINI PEN NEEDLES 31G X 5 MM Misc Generic drug: Insulin Pen Needle USE AS DIRECTED TWICE DAILY   Insulin Syringe-Needle U-100 30G X 5/16" 1 ML Misc Commonly known as: UltiCare Insulin Syringe USE AS DIRECTED   multivitamin Tabs tablet Take 1 tablet by mouth daily.   nystatin ointment Commonly known as: MYCOSTATIN Apply 1 application topically 2 (two) times daily.   ONE TOUCH ULTRA 2 w/Device Kit 1 kit by Does not apply route 3 (three) times daily. ICD-10 code: Z16.96   OneTouch Delica Lancets 78L Misc 1 Device by Does not apply route as needed (to check blood glucose).   PARSABIV IV Inject into the vein.   traMADol 50  MG tablet Commonly known as: ULTRAM Take 1 tablet (50 mg total) by mouth every 12 (twelve) hours as needed for severe pain.   triamcinolone ointment 0.5 % Commonly known as: KENALOG Apply 1 application topically 2 (two) times daily.       Discharge Instructions: Please refer to Patient Instructions section of EMR for full details.  Patient was counseled important signs and symptoms that should prompt return to medical care, changes in medications, dietary instructions, activity restrictions, and follow up appointments.   Follow-Up Appointments:   Lurline Del, DO 02/01/2020, 4:40 PM PGY-2, [facility]

## 2020-02-01 NOTE — Progress Notes (Signed)
  Echocardiogram 2D Echocardiogram has been performed.  Elmer Ramp 02/01/2020, 12:32 PM

## 2020-02-02 ENCOUNTER — Telehealth: Payer: Self-pay | Admitting: Nurse Practitioner

## 2020-02-02 NOTE — Telephone Encounter (Signed)
Transition of care contact from inpatient facility  Date of discharge: 02/01/2020 Date of contact: 02/02/2020 Method: Phone Spoke to: Patient  Patient contacted to discuss transition of care from recent inpatient hospitalization. Patient was admitted to [facility] from 01/22-01/23/2022 with discharge diagnosis of COVD 19 PNA.   Medication changes were reviewed.  Patient will follow up with his/her outpatient HD unit on: Was supposed to go to HD but felt too weak. She says she will attend 02/04/2020.

## 2020-02-03 ENCOUNTER — Ambulatory Visit: Payer: Medicare Other

## 2020-02-03 LAB — GLUCOSE, CAPILLARY: Glucose-Capillary: 44 mg/dL — CL (ref 70–99)

## 2020-02-05 ENCOUNTER — Emergency Department (HOSPITAL_COMMUNITY): Payer: Medicare Other

## 2020-02-05 ENCOUNTER — Other Ambulatory Visit: Payer: Self-pay

## 2020-02-05 ENCOUNTER — Emergency Department (HOSPITAL_COMMUNITY)
Admission: EM | Admit: 2020-02-05 | Discharge: 2020-02-06 | Disposition: A | Discharge status: Home / Self Care | Payer: Medicare Other | Attending: Emergency Medicine | Admitting: Emergency Medicine

## 2020-02-05 ENCOUNTER — Encounter (HOSPITAL_COMMUNITY): Payer: Self-pay | Admitting: Emergency Medicine

## 2020-02-05 DIAGNOSIS — Z992 Dependence on renal dialysis: Secondary | ICD-10-CM | POA: Diagnosis not present

## 2020-02-05 DIAGNOSIS — Z8616 Personal history of COVID-19: Secondary | ICD-10-CM | POA: Insufficient documentation

## 2020-02-05 DIAGNOSIS — Z7982 Long term (current) use of aspirin: Secondary | ICD-10-CM | POA: Diagnosis not present

## 2020-02-05 DIAGNOSIS — M79604 Pain in right leg: Secondary | ICD-10-CM

## 2020-02-05 DIAGNOSIS — Z794 Long term (current) use of insulin: Secondary | ICD-10-CM | POA: Insufficient documentation

## 2020-02-05 DIAGNOSIS — E1140 Type 2 diabetes mellitus with diabetic neuropathy, unspecified: Secondary | ICD-10-CM | POA: Insufficient documentation

## 2020-02-05 DIAGNOSIS — I509 Heart failure, unspecified: Secondary | ICD-10-CM | POA: Diagnosis not present

## 2020-02-05 DIAGNOSIS — E1122 Type 2 diabetes mellitus with diabetic chronic kidney disease: Secondary | ICD-10-CM | POA: Insufficient documentation

## 2020-02-05 DIAGNOSIS — E11649 Type 2 diabetes mellitus with hypoglycemia without coma: Secondary | ICD-10-CM | POA: Diagnosis not present

## 2020-02-05 DIAGNOSIS — N186 End stage renal disease: Secondary | ICD-10-CM | POA: Diagnosis not present

## 2020-02-05 DIAGNOSIS — M79661 Pain in right lower leg: Secondary | ICD-10-CM | POA: Diagnosis present

## 2020-02-05 DIAGNOSIS — I132 Hypertensive heart and chronic kidney disease with heart failure and with stage 5 chronic kidney disease, or end stage renal disease: Secondary | ICD-10-CM | POA: Diagnosis not present

## 2020-02-05 DIAGNOSIS — E162 Hypoglycemia, unspecified: Secondary | ICD-10-CM

## 2020-02-05 LAB — I-STAT VENOUS BLOOD GAS, ED
Acid-Base Excess: 6 mmol/L — ABNORMAL HIGH (ref 0.0–2.0)
Bicarbonate: 33.1 mmol/L — ABNORMAL HIGH (ref 20.0–28.0)
Calcium, Ion: 1.02 mmol/L — ABNORMAL LOW (ref 1.15–1.40)
HCT: 36 % (ref 36.0–46.0)
Hemoglobin: 12.2 g/dL (ref 12.0–15.0)
O2 Saturation: 36 %
Potassium: 3.6 mmol/L (ref 3.5–5.1)
Sodium: 136 mmol/L (ref 135–145)
TCO2: 35 mmol/L — ABNORMAL HIGH (ref 22–32)
pCO2, Ven: 57.8 mmHg (ref 44.0–60.0)
pH, Ven: 7.366 (ref 7.250–7.430)
pO2, Ven: 23 mmHg — CL (ref 32.0–45.0)

## 2020-02-05 LAB — CBG MONITORING, ED
Glucose-Capillary: 64 mg/dL — ABNORMAL LOW (ref 70–99)
Glucose-Capillary: 126 mg/dL — ABNORMAL HIGH (ref 70–99)
Glucose-Capillary: 88 mg/dL (ref 70–99)
Glucose-Capillary: 93 mg/dL (ref 70–99)

## 2020-02-05 LAB — BASIC METABOLIC PANEL
Anion gap: 18 — ABNORMAL HIGH (ref 5–15)
BUN: 35 mg/dL — ABNORMAL HIGH (ref 8–23)
CO2: 24 mmol/L (ref 22–32)
Calcium: 8 mg/dL — ABNORMAL LOW (ref 8.9–10.3)
Chloride: 92 mmol/L — ABNORMAL LOW (ref 98–111)
Creatinine, Ser: 9.23 mg/dL — ABNORMAL HIGH (ref 0.44–1.00)
GFR, Estimated: 4 mL/min — ABNORMAL LOW (ref >60)
Glucose, Bld: 55 mg/dL — ABNORMAL LOW (ref 70–99)
Potassium: 4.5 mmol/L (ref 3.5–5.1)
Sodium: 134 mmol/L — ABNORMAL LOW (ref 135–145)

## 2020-02-05 LAB — CBC
HCT: 34 % — ABNORMAL LOW (ref 36.0–46.0)
Hemoglobin: 10.8 g/dL — ABNORMAL LOW (ref 12.0–15.0)
MCH: 31.7 pg (ref 26.0–34.0)
MCHC: 31.8 g/dL (ref 30.0–36.0)
MCV: 99.7 fL (ref 80.0–100.0)
Platelets: 200 10*3/uL (ref 150–400)
RBC: 3.41 MIL/uL — ABNORMAL LOW (ref 3.87–5.11)
RDW: 17.2 % — ABNORMAL HIGH (ref 11.5–15.5)
WBC: 13.3 10*3/uL — ABNORMAL HIGH (ref 4.0–10.5)
nRBC: 0 % (ref 0.0–0.2)

## 2020-02-05 MED ORDER — HYDROCODONE-ACETAMINOPHEN 5-325 MG PO TABS
1.0000 | ORAL_TABLET | Freq: Once | ORAL | Status: AC
  Administered 2020-02-05: 1 via ORAL
  Filled 2020-02-05: qty 1

## 2020-02-05 MED ORDER — HYDROCODONE-ACETAMINOPHEN 5-325 MG PO TABS: 1.0000 | ORAL_TABLET | Freq: Four times a day (QID) | ORAL | 0 refills | Status: AC | PRN

## 2020-02-05 NOTE — Discharge Instructions (Signed)
Have your primary care doctor work-up the swelling and pain in the leg.

## 2020-02-05 NOTE — ED Notes (Signed)
PTAR called for pt 

## 2020-02-05 NOTE — ED Notes (Signed)
PTAR called again

## 2020-02-05 NOTE — ED Notes (Signed)
Pt CBG 64. Notified Pickering MD and giving pt peanut butter crackers and OJ.

## 2020-02-05 NOTE — ED Notes (Signed)
Cov+  PTAR if DC

## 2020-02-05 NOTE — ED Provider Notes (Signed)
[facility] EMERGENCY DEPARTMENT Provider Note   CSN: [id number] Arrival date & time: 02/05/20  1025     History Chief Complaint  Patient presents with  . Shortness of Breath  . Rash    Rachel Chaney is a 60 y.o. female.  HPI Patient reportedly called EMS for low blood sugar.  States she felt as if her sugar was low but her reading on her machine did say low.  States EMS arrived and had a CBG of 116.  However sats were 64% on room air.  However patient is not supposed be on room air supposed be on 3 L all the time.  Have been off her oxygen since last night.  Improved on nasal cannula.  Patient's only complaint is swelling on her right thigh that has been there for months.  No fevers.  Recent admission to the hospital with Covid pneumonia.  No chest pain.  Denies more trouble breathing.  She is a dialysis patient was dialyzed yesterday.    Past Medical History:  Diagnosis Date  . Acute respiratory failure with hypoxia (HCC)   . Cardiac arrest (HCC) 02/28/2018   while on hemodialysis in hospital  . CHF (congestive heart failure) (HCC)   . Chronic kidney disease due to diabetes mellitus (HCC) 10/13/2013  . Community acquired bilateral lower lobe pneumonia 11/11/2019  . Diabetes mellitus    Type II  . ESRD (end stage renal disease) (HCC)    MWF - [facility]  . Excess fluid volume 12/16/2019  . Hyperlipidemia   . Hypertension   . Neuropathy     Patient Active Problem List   Diagnosis Date Noted  . COVID-19 01/31/2020  . Elevated troponin   . 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 Blurred vision 01/28/2020  . Rash and nonspecific skin eruption 01/15/2020  . Infestation by bed bug 01/15/2020  . Xerosis of skin 01/06/2020  . Hemorrhoids 11/25/2019  . Papular rash 11/25/2019  . Obstructive sleep apnea 11/12/2019  . Hypercapnic respiratory failure (HCC) 11/12/2019  . Seizure-like activity (HCC)   . S/P transmetatarsal amputation of foot, right (HCC)  04/24/2018  . PAF (paroxysmal atrial fibrillation) (HCC)   . Oxygen dependent   . Type 2 diabetes mellitus with diabetic neuropathy (HCC)   . Severe protein-calorie malnutrition (HCC)   . Seasonal allergies 04/12/2017  . Post-menopausal bleeding 12/07/2015  . ESRD (end stage renal disease) on dialysis (HCC) 05/25/2015  . Hypoglycemia 04/12/2015  . Hypoalbuminemia   . Leukocytosis 05/17/2013  . Abnormal appearance of cervix 08/08/2011  . Back pain 08/07/2011  . GERD (gastroesophageal reflux disease) 04/27/2010  . Hyperlipidemia 03/08/2006  . Morbid obesity (HCC) 03/08/2006  . Essential hypertension with morbid obesity 03/08/2006    Past Surgical History:  Procedure Laterality Date  . AMPUTATION Right 02/27/2018   Procedure: RIGHT FOOT 2ND AND 3RD RAY AMPUTATION;  Surgeon: Duda, Marcus V, MD;  Location: [facility] OR;  Service: Orthopedics;  Laterality: Right;  . AMPUTATION Right 04/24/2018   Procedure: RIGHT TRANSMETATARSAL AMPUTATION, PLACE VAC;  Surgeon: Duda, Marcus V, MD;  Location: [facility] OR;  Service: Orthopedics;  Laterality: Right;  . AV FISTULA PLACEMENT Left 04/15/2015   Procedure: ARTERIOVENOUS (AV) FISTULA CREATION;  Surgeon: James D Lawson, MD;  Location: [facility] OR;  Service: Vascular;  Laterality: Left;  . EYE SURGERY Right    retinal detachment  . FISTULA SUPERFICIALIZATION Left 06/03/2015   Procedure: LEFT UPPER ARM BRACHIOCEPHALIC FISTULA SUPERFICIALIZATION;  Surgeon: James D Lawson, MD;  Location:   [facility] OR;  Service: Vascular;  Laterality: Left;  from MAC to General  . STUMP REVISION Right 05/24/2018   Procedure: REVISION RIGHT TRANSMETATARSAL AMPUTATION WITH EXCISION BONE/SOFT TISSUE, LOCAL TISSUE  RE-ARRANGEMENT  AND VAC PLACEMENT;  Surgeon: Duda, Marcus V, MD;  Location: [facility] OR;  Service: Orthopedics;  Laterality: Right;     OB History   No obstetric history on file.     Family History  Problem Relation Age of Onset  . Heart disease Mother   . Heart disease Father     Social  History   Tobacco Use  . Smoking status: Never Smoker  . Smokeless tobacco: Never Used  Vaping Use  . Vaping Use: Never used  Substance Use Topics  . Alcohol use: No    Alcohol/week: 0.0 standard drinks  . Drug use: No    Home Medications Prior to Admission medications   Medication Sig Start Date End Date Taking? Authorizing Provider  HYDROcodone-acetaminophen (NORCO/VICODIN) 5-325 MG tablet Take 1-2 tablets by mouth every 6 (six) hours as needed. 02/05/20  Yes Pickering, Nathan, MD  acetaminophen (TYLENOL) 500 MG tablet Take 1,000 mg by mouth every 6 (six) hours as needed for mild pain or headache.     [provider]  aspirin EC 81 MG tablet Take 1 tablet (81 mg total) by mouth daily. 04/30/18   Kilroy, Luke K, PA-C  atorvastatin (LIPITOR) 80 MG tablet TAKE 1 TABLET(80 MG) BY MOUTH AT BEDTIME Patient taking differently: Take 80 mg by mouth at bedtime. 11/04/19   Olson, Daniel K, MD  Blood Glucose Monitoring Suppl (ONE TOUCH ULTRA 2) w/Device KIT 1 kit by Does not apply route 3 (three) times daily. ICD-10 code: E11.40 03/26/18   Lassen, Arlo C, PA-C  cetirizine (ZYRTEC) 10 MG tablet Take 10 mg by mouth as needed for allergies.  04/26/18   [provider]  cyclobenzaprine (FLEXERIL) 10 MG tablet TAKE 1 TABLET(10 MG) BY MOUTH THREE TIMES DAILY BETWEEN MEALS AND AT BEDTIME AS NEEDED FOR MUSCLE SPASMS Patient taking differently: Take 10 mg by mouth 3 (three) times daily as needed for muscle spasms. 12/09/19   Olson, Daniel K, MD  Doxepin HCl 5 % CREA Apply to affected area up to 4 times a day as needed for up to eight days. 01/08/20   Olson, Daniel K, MD  Etelcalcetide HCl (PARSABIV IV) Inject into the vein.  02/10/19 02/09/20  [provider]  ferric citrate (AURYXIA) 1 GM 210 MG(Fe) tablet Take 2 tablets (420 mg total) by mouth 3 (three) times daily with meals. Give two (420 mg) by mouth three times a day wih meals for ESRD Patient taking differently: Take 210-420 mg by  mouth See admin instructions. Take 420 mg with each meal and 210 mg with snacks 03/26/18   Lassen, Arlo C, PA-C  fluticasone (FLONASE) 50 MCG/ACT nasal spray Place 1 spray into both nostrils daily. Patient taking differently: Place 1 spray into both nostrils daily as needed. 06/16/19   Olson, Daniel K, MD  glucose blood (ONE TOUCH ULTRA TEST) test strip 1 each by Other route 3 (three) times daily. ICD-10 code: E11.40. 03/26/18   Lassen, Arlo C, PA-C  hydrOXYzine (ATARAX/VISTARIL) 10 MG tablet Take 1 tablet (10 mg total) by mouth daily as needed for itching. 01/15/20   Beard, Samantha N, DO  Insulin Pen Needle (B-D UF III MINI PEN NEEDLES) 31G X 5 MM MISC USE AS DIRECTED TWICE DAILY 04/30/19   Olson, Daniel K, MD  Insulin   Pen Needle 29G X 12MM MISC Inject 1 pen into the skin 2 (two) times daily. Check blood sugar daily. 03/26/18   Lassen, Arlo C, PA-C  Insulin Syringe-Needle U-100 (ULTICARE INSULIN SYRINGE) 30G X 5/16" 1 ML MISC USE AS DIRECTED 03/26/18   Lassen, Arlo C, PA-C  multivitamin (RENA-VIT) TABS tablet Take 1 tablet by mouth daily.    [provider]  nystatin ointment (MYCOSTATIN) Apply 1 application topically 2 (two) times daily. Patient not taking: Reported on 01/31/2020    [provider]  OneTouch Delica Lancets 33G MISC 1 Device by Does not apply route as needed (to check blood glucose). 03/26/18   Lassen, Arlo C, PA-C  traMADol (ULTRAM) 50 MG tablet Take 1 tablet (50 mg total) by mouth every 12 (twelve) hours as needed for severe pain. 01/28/20 02/27/20  Meccariello, Bailey J, DO  triamcinolone ointment (KENALOG) 0.5 % Apply 1 application topically 2 (two) times daily. Patient not taking: Reported on 01/31/2020    [provider]  progesterone (PROMETRIUM) 200 MG capsule Take 1 capsule (200 mg total) by mouth daily. Take for 10 days if bleeding occurs for more than 4 days. 11/09/10 03/28/11  Smith, Zachary M, DO    Allergies    Patient has no known allergies.  Review  of Systems   Review of Systems  Constitutional: Negative for appetite change.  HENT: Negative for congestion.   Respiratory: Negative for shortness of breath.   Gastrointestinal: Negative for abdominal pain.  Genitourinary: Negative for flank pain.  Musculoskeletal: Negative for arthralgias.  Skin: Positive for wound.  Neurological: Positive for weakness.  Psychiatric/Behavioral: Negative for confusion.    Physical Exam Updated Vital Signs BP 132/64   Pulse 81   Temp 98.3 F (36.8 C) (Oral)   Resp 16   Ht 5' 4" (1.626 m)   LMP 09/18/2011   SpO2 100%   BMI 38.31 kg/m   Physical Exam Vitals and nursing note reviewed.  HENT:     Head: Atraumatic.  Eyes:     Pupils: Pupils are equal, round, and reactive to light.  Cardiovascular:     Rate and Rhythm: Regular rhythm.  Pulmonary:     Comments: Mildly harsh breath sounds without focal rales or rhonchi. Chest:     Chest wall: No tenderness.  Abdominal:     Tenderness: There is no abdominal tenderness.  Musculoskeletal:     Cervical back: Neck supple.     Right lower leg: Tenderness present.     Left lower leg: Tenderness present.     Comments: Firm areas in the skin over much of the right thigh.  Goes somewhat horizontally across with more lateral involvement.  There is one ulcer more anterior to lateral.  No fluctuance.  Upper skin is hyperpigmented and areas but the firmness appears to be slightly below the top layers of the skin.  Also some area on her right buttock and some on left medial thigh also.  Skin:    Capillary Refill: Capillary refill takes less than 2 seconds.  Neurological:     Mental Status: She is alert and oriented to person, place, and time.     ED Results / Procedures / Treatments   Labs (all labs ordered are listed, but only abnormal results are displayed) Labs Reviewed  CBC - Abnormal; Notable for the following components:      Result Value   WBC 13.3 (*)    RBC 3.41 (*)    Hemoglobin 10.8  (*)      HCT 34.0 (*)    RDW 17.2 (*)    All other components within normal limits  BASIC METABOLIC PANEL - Abnormal; Notable for the following components:   Sodium 134 (*)    Chloride 92 (*)    Glucose, Bld 55 (*)    BUN 35 (*)    Creatinine, Ser 9.23 (*)    Calcium 8.0 (*)    GFR, Estimated 4 (*)    Anion gap 18 (*)    All other components within normal limits  I-STAT VENOUS BLOOD GAS, ED - Abnormal; Notable for the following components:   pO2, Ven 23.0 (*)    Bicarbonate 33.1 (*)    TCO2 35 (*)    Acid-Base Excess 6.0 (*)    Calcium, Ion 1.02 (*)    All other components within normal limits  CBG MONITORING, ED - Abnormal; Notable for the following components:   Glucose-Capillary 64 (*)    All other components within normal limits  CBG MONITORING, ED - Abnormal; Notable for the following components:   Glucose-Capillary 126 (*)    All other components within normal limits  CBG MONITORING, ED    EKG EKG Interpretation  Date/Time:  Thursday February 05 2020 10:46:38 EST Ventricular Rate:  88 PR Interval:  172 QRS Duration: 84 QT Interval:  406 QTC Calculation: 491 R Axis:   145 Text Interpretation: Normal sinus rhythm Left posterior fascicular block Possible Inferior infarct , age undetermined Abnormal ECG Confirmed by Davonna Belling [phone]) on 02/05/2020 11:35:54 AM   Radiology DG Chest 2 View  Result Date: 02/05/2020 CLINICAL DATA:  Shortness of breath No bilateral lower extremity swelling and pain EXAM: CHEST - 2 VIEW COMPARISON:  CT chest 01/31/2020 Chest radiograph 12/16/2019 FINDINGS: Unchanged mild cardiomegaly. Mild pulmonary vascular congestion again seen. Interval worsening of bilateral airspace opacities. IMPRESSION: Multifocal bilateral airspace opacities may be due to pulmonary edema or pneumonia. Electronically Signed   By: Miachel Roux M.D.   On: 02/05/2020 11:18    Procedures Procedures   Medications Ordered in ED Medications   HYDROcodone-acetaminophen (NORCO/VICODIN) 5-325 MG per tablet 1 tablet (has no administration in time range)    ED Course  I have reviewed the triage vital signs and the nursing notes.  Pertinent labs & imaging results that were available during my care of the patient were reviewed by me and considered in my medical decision making (see chart for details).    MDM Rules/Calculators/A&P                          Patient presented with hypoglycemia.  Low at home but EMS CBG reportedly 116.  However was low here.  Has improved with eating and stayed up.  Does not really feel short of breath.  Does have recent Covid pneumonia with admission.  On baseline oxygen.  Had been off oxygen last night however.  Patient is mostly worried about the swelling and pain on her legs.  Appears just deep to the superficial skin vertically on the right thigh.  Potentially chronic abnormality such as calciphylaxis.  Will treat with pain medicine.  Does not appear as a cellulitis.  Needs to be followed as an outpatient with her PCP.  Will discharge home. Final Clinical Impression(s) / ED Diagnoses Final diagnoses:  Hypoglycemia  Pain of right lower extremity    Rx / DC Orders ED Discharge Orders         Ordered    HYDROcodone-acetaminophen (  NORCO/VICODIN) 5-325 MG tablet  Every 6 hours PRN        02/05/20 1503           Davonna Belling, MD 02/05/20 1523

## 2020-02-05 NOTE — ED Triage Notes (Signed)
Patient BIB [facility] from home. Called out for low blood sugar, cbg 116 with EMS. Patient SpO2 64% on room air. Patient is supposed to be on 3 L [place] all the time, patient reports being off oxygen since 11 last night. Patient states she feels fine however. SpO2 to 88% via 6 L [place] with EMS. Patient only complaint is rash on right leg that has been there for 3 months.

## 2020-02-10 ENCOUNTER — Other Ambulatory Visit: Payer: Self-pay | Admitting: Family Medicine

## 2020-02-11 ENCOUNTER — Encounter (HOSPITAL_COMMUNITY): Payer: Self-pay

## 2020-02-11 ENCOUNTER — Other Ambulatory Visit: Payer: Self-pay

## 2020-02-11 ENCOUNTER — Inpatient Hospital Stay (HOSPITAL_COMMUNITY)
Admission: EM | Admit: 2020-02-11 | Discharge: 2020-03-09 | DRG: 870 | Disposition: E | Payer: Medicare Other | Attending: Critical Care Medicine | Admitting: Critical Care Medicine

## 2020-02-11 ENCOUNTER — Emergency Department (HOSPITAL_COMMUNITY): Payer: Medicare Other

## 2020-02-11 DIAGNOSIS — Z89431 Acquired absence of right foot: Secondary | ICD-10-CM

## 2020-02-11 DIAGNOSIS — D72829 Elevated white blood cell count, unspecified: Secondary | ICD-10-CM

## 2020-02-11 DIAGNOSIS — I272 Pulmonary hypertension, unspecified: Secondary | ICD-10-CM | POA: Diagnosis present

## 2020-02-11 DIAGNOSIS — Z0184 Encounter for antibody response examination: Secondary | ICD-10-CM

## 2020-02-11 DIAGNOSIS — A4189 Other specified sepsis: Principal | ICD-10-CM | POA: Diagnosis present

## 2020-02-11 DIAGNOSIS — M79605 Pain in left leg: Secondary | ICD-10-CM | POA: Diagnosis present

## 2020-02-11 DIAGNOSIS — I700 Atherosclerosis of aorta: Secondary | ICD-10-CM | POA: Diagnosis present

## 2020-02-11 DIAGNOSIS — I2510 Atherosclerotic heart disease of native coronary artery without angina pectoris: Secondary | ICD-10-CM | POA: Diagnosis present

## 2020-02-11 DIAGNOSIS — Z09 Encounter for follow-up examination after completed treatment for conditions other than malignant neoplasm: Secondary | ICD-10-CM

## 2020-02-11 DIAGNOSIS — U099 Post covid-19 condition, unspecified: Secondary | ICD-10-CM | POA: Diagnosis present

## 2020-02-11 DIAGNOSIS — L8990 Pressure ulcer of unspecified site, unspecified stage: Secondary | ICD-10-CM | POA: Insufficient documentation

## 2020-02-11 DIAGNOSIS — E875 Hyperkalemia: Secondary | ICD-10-CM | POA: Diagnosis not present

## 2020-02-11 DIAGNOSIS — E1122 Type 2 diabetes mellitus with diabetic chronic kidney disease: Secondary | ICD-10-CM | POA: Diagnosis present

## 2020-02-11 DIAGNOSIS — L988 Other specified disorders of the skin and subcutaneous tissue: Secondary | ICD-10-CM | POA: Diagnosis present

## 2020-02-11 DIAGNOSIS — Z6837 Body mass index (BMI) 37.0-37.9, adult: Secondary | ICD-10-CM

## 2020-02-11 DIAGNOSIS — K219 Gastro-esophageal reflux disease without esophagitis: Secondary | ICD-10-CM | POA: Diagnosis present

## 2020-02-11 DIAGNOSIS — L942 Calcinosis cutis: Secondary | ICD-10-CM | POA: Diagnosis present

## 2020-02-11 DIAGNOSIS — G9341 Metabolic encephalopathy: Secondary | ICD-10-CM | POA: Diagnosis not present

## 2020-02-11 DIAGNOSIS — Z8249 Family history of ischemic heart disease and other diseases of the circulatory system: Secondary | ICD-10-CM

## 2020-02-11 DIAGNOSIS — Y95 Nosocomial condition: Secondary | ICD-10-CM | POA: Diagnosis present

## 2020-02-11 DIAGNOSIS — E785 Hyperlipidemia, unspecified: Secondary | ICD-10-CM | POA: Diagnosis present

## 2020-02-11 DIAGNOSIS — J9601 Acute respiratory failure with hypoxia: Secondary | ICD-10-CM | POA: Diagnosis present

## 2020-02-11 DIAGNOSIS — L853 Xerosis cutis: Secondary | ICD-10-CM | POA: Diagnosis present

## 2020-02-11 DIAGNOSIS — Z9981 Dependence on supplemental oxygen: Secondary | ICD-10-CM

## 2020-02-11 DIAGNOSIS — I132 Hypertensive heart and chronic kidney disease with heart failure and with stage 5 chronic kidney disease, or end stage renal disease: Secondary | ICD-10-CM | POA: Diagnosis present

## 2020-02-11 DIAGNOSIS — E1142 Type 2 diabetes mellitus with diabetic polyneuropathy: Secondary | ICD-10-CM | POA: Diagnosis present

## 2020-02-11 DIAGNOSIS — Z8674 Personal history of sudden cardiac arrest: Secondary | ICD-10-CM

## 2020-02-11 DIAGNOSIS — Z79899 Other long term (current) drug therapy: Secondary | ICD-10-CM

## 2020-02-11 DIAGNOSIS — J9621 Acute and chronic respiratory failure with hypoxia: Secondary | ICD-10-CM | POA: Diagnosis present

## 2020-02-11 DIAGNOSIS — N950 Postmenopausal bleeding: Secondary | ICD-10-CM | POA: Diagnosis present

## 2020-02-11 DIAGNOSIS — I5189 Other ill-defined heart diseases: Secondary | ICD-10-CM | POA: Diagnosis not present

## 2020-02-11 DIAGNOSIS — R778 Other specified abnormalities of plasma proteins: Secondary | ICD-10-CM | POA: Diagnosis present

## 2020-02-11 DIAGNOSIS — Z9119 Patient's noncompliance with other medical treatment and regimen: Secondary | ICD-10-CM

## 2020-02-11 DIAGNOSIS — R6521 Severe sepsis with septic shock: Secondary | ICD-10-CM | POA: Diagnosis present

## 2020-02-11 DIAGNOSIS — R404 Transient alteration of awareness: Secondary | ICD-10-CM | POA: Diagnosis not present

## 2020-02-11 DIAGNOSIS — Z992 Dependence on renal dialysis: Secondary | ICD-10-CM

## 2020-02-11 DIAGNOSIS — N2581 Secondary hyperparathyroidism of renal origin: Secondary | ICD-10-CM | POA: Diagnosis present

## 2020-02-11 DIAGNOSIS — Z7982 Long term (current) use of aspirin: Secondary | ICD-10-CM

## 2020-02-11 DIAGNOSIS — M79604 Pain in right leg: Secondary | ICD-10-CM | POA: Diagnosis present

## 2020-02-11 DIAGNOSIS — J9620 Acute and chronic respiratory failure, unspecified whether with hypoxia or hypercapnia: Secondary | ICD-10-CM

## 2020-02-11 DIAGNOSIS — D631 Anemia in chronic kidney disease: Secondary | ICD-10-CM | POA: Diagnosis present

## 2020-02-11 DIAGNOSIS — L0390 Cellulitis, unspecified: Secondary | ICD-10-CM

## 2020-02-11 DIAGNOSIS — E874 Mixed disorder of acid-base balance: Secondary | ICD-10-CM | POA: Diagnosis present

## 2020-02-11 DIAGNOSIS — U071 COVID-19: Secondary | ICD-10-CM | POA: Diagnosis present

## 2020-02-11 DIAGNOSIS — T82838A Hemorrhage of vascular prosthetic devices, implants and grafts, initial encounter: Secondary | ICD-10-CM | POA: Diagnosis not present

## 2020-02-11 DIAGNOSIS — E669 Obesity, unspecified: Secondary | ICD-10-CM | POA: Diagnosis present

## 2020-02-11 DIAGNOSIS — Z9889 Other specified postprocedural states: Secondary | ICD-10-CM

## 2020-02-11 DIAGNOSIS — I2699 Other pulmonary embolism without acute cor pulmonale: Secondary | ICD-10-CM | POA: Diagnosis not present

## 2020-02-11 DIAGNOSIS — J9690 Respiratory failure, unspecified, unspecified whether with hypoxia or hypercapnia: Secondary | ICD-10-CM

## 2020-02-11 DIAGNOSIS — Y712 Prosthetic and other implants, materials and accessory cardiovascular devices associated with adverse incidents: Secondary | ICD-10-CM | POA: Diagnosis not present

## 2020-02-11 DIAGNOSIS — I468 Cardiac arrest due to other underlying condition: Secondary | ICD-10-CM | POA: Diagnosis not present

## 2020-02-11 DIAGNOSIS — J810 Acute pulmonary edema: Secondary | ICD-10-CM

## 2020-02-11 DIAGNOSIS — J189 Pneumonia, unspecified organism: Secondary | ICD-10-CM | POA: Diagnosis present

## 2020-02-11 DIAGNOSIS — T8241XA Breakdown (mechanical) of vascular dialysis catheter, initial encounter: Secondary | ICD-10-CM | POA: Diagnosis not present

## 2020-02-11 DIAGNOSIS — N186 End stage renal disease: Secondary | ICD-10-CM

## 2020-02-11 DIAGNOSIS — H538 Other visual disturbances: Secondary | ICD-10-CM | POA: Diagnosis present

## 2020-02-11 DIAGNOSIS — Z9911 Dependence on respirator [ventilator] status: Secondary | ICD-10-CM

## 2020-02-11 DIAGNOSIS — R21 Rash and other nonspecific skin eruption: Secondary | ICD-10-CM | POA: Diagnosis present

## 2020-02-11 DIAGNOSIS — E662 Morbid (severe) obesity with alveolar hypoventilation: Secondary | ICD-10-CM | POA: Diagnosis present

## 2020-02-11 DIAGNOSIS — L89210 Pressure ulcer of right hip, unstageable: Secondary | ICD-10-CM | POA: Diagnosis present

## 2020-02-11 DIAGNOSIS — L280 Lichen simplex chronicus: Secondary | ICD-10-CM | POA: Diagnosis present

## 2020-02-11 DIAGNOSIS — Z452 Encounter for adjustment and management of vascular access device: Secondary | ICD-10-CM

## 2020-02-11 DIAGNOSIS — I5032 Chronic diastolic (congestive) heart failure: Secondary | ICD-10-CM | POA: Diagnosis present

## 2020-02-11 DIAGNOSIS — Z66 Do not resuscitate: Secondary | ICD-10-CM | POA: Diagnosis not present

## 2020-02-11 DIAGNOSIS — J1282 Pneumonia due to coronavirus disease 2019: Secondary | ICD-10-CM | POA: Diagnosis present

## 2020-02-11 DIAGNOSIS — I480 Paroxysmal atrial fibrillation: Secondary | ICD-10-CM | POA: Diagnosis present

## 2020-02-11 DIAGNOSIS — D62 Acute posthemorrhagic anemia: Secondary | ICD-10-CM | POA: Diagnosis not present

## 2020-02-11 DIAGNOSIS — E8770 Fluid overload, unspecified: Secondary | ICD-10-CM | POA: Diagnosis present

## 2020-02-11 DIAGNOSIS — Z515 Encounter for palliative care: Secondary | ICD-10-CM

## 2020-02-11 DIAGNOSIS — E1165 Type 2 diabetes mellitus with hyperglycemia: Secondary | ICD-10-CM | POA: Diagnosis not present

## 2020-02-11 DIAGNOSIS — Z794 Long term (current) use of insulin: Secondary | ICD-10-CM

## 2020-02-11 DIAGNOSIS — I6782 Cerebral ischemia: Secondary | ICD-10-CM | POA: Diagnosis present

## 2020-02-11 DIAGNOSIS — Z7189 Other specified counseling: Secondary | ICD-10-CM

## 2020-02-11 DIAGNOSIS — J811 Chronic pulmonary edema: Secondary | ICD-10-CM

## 2020-02-11 DIAGNOSIS — Z978 Presence of other specified devices: Secondary | ICD-10-CM

## 2020-02-11 DIAGNOSIS — I9589 Other hypotension: Secondary | ICD-10-CM | POA: Diagnosis present

## 2020-02-11 DIAGNOSIS — E11649 Type 2 diabetes mellitus with hypoglycemia without coma: Secondary | ICD-10-CM | POA: Diagnosis present

## 2020-02-11 DIAGNOSIS — Z4659 Encounter for fitting and adjustment of other gastrointestinal appliance and device: Secondary | ICD-10-CM

## 2020-02-11 DIAGNOSIS — G8929 Other chronic pain: Secondary | ICD-10-CM | POA: Diagnosis present

## 2020-02-11 DIAGNOSIS — I252 Old myocardial infarction: Secondary | ICD-10-CM

## 2020-02-11 LAB — I-STAT ARTERIAL BLOOD GAS, ED
Acid-Base Excess: 5 mmol/L — ABNORMAL HIGH (ref 0.0–2.0)
Bicarbonate: 32.2 mmol/L — ABNORMAL HIGH (ref 20.0–28.0)
Calcium, Ion: 1.29 mmol/L (ref 1.15–1.40)
HCT: 35 % — ABNORMAL LOW (ref 36.0–46.0)
Hemoglobin: 11.9 g/dL — ABNORMAL LOW (ref 12.0–15.0)
O2 Saturation: 90 %
Patient temperature: 98.5
Potassium: 4.1 mmol/L (ref 3.5–5.1)
Sodium: 133 mmol/L — ABNORMAL LOW (ref 135–145)
TCO2: 34 mmol/L — ABNORMAL HIGH (ref 22–32)
pCO2 arterial: 58.9 mmHg — ABNORMAL HIGH (ref 32.0–48.0)
pH, Arterial: 7.345 — ABNORMAL LOW (ref 7.350–7.450)
pO2, Arterial: 64 mmHg — ABNORMAL LOW (ref 83.0–108.0)

## 2020-02-11 LAB — CBC WITH DIFFERENTIAL/PLATELET
Abs Immature Granulocytes: 1.31 10*3/uL — ABNORMAL HIGH (ref 0.00–0.07)
Basophils Absolute: 0.1 10*3/uL (ref 0.0–0.1)
Basophils Relative: 0 %
Eosinophils Absolute: 0.2 10*3/uL (ref 0.0–0.5)
Eosinophils Relative: 1 %
HCT: 38.1 % (ref 36.0–46.0)
Hemoglobin: 11.3 g/dL — ABNORMAL LOW (ref 12.0–15.0)
Immature Granulocytes: 4 %
Lymphocytes Relative: 5 %
Lymphs Abs: 1.6 10*3/uL (ref 0.7–4.0)
MCH: 30.7 pg (ref 26.0–34.0)
MCHC: 29.7 g/dL — ABNORMAL LOW (ref 30.0–36.0)
MCV: 103.5 fL — ABNORMAL HIGH (ref 80.0–100.0)
Monocytes Absolute: 2.4 10*3/uL — ABNORMAL HIGH (ref 0.1–1.0)
Monocytes Relative: 7 %
Neutro Abs: 29.1 10*3/uL — ABNORMAL HIGH (ref 1.7–7.7)
Neutrophils Relative %: 83 %
Platelets: 331 10*3/uL (ref 150–400)
RBC: 3.68 MIL/uL — ABNORMAL LOW (ref 3.87–5.11)
RDW: 18.3 % — ABNORMAL HIGH (ref 11.5–15.5)
WBC: 34.6 10*3/uL — ABNORMAL HIGH (ref 4.0–10.5)
nRBC: 0.1 % (ref 0.0–0.2)

## 2020-02-11 LAB — BASIC METABOLIC PANEL
Anion gap: 16 — ABNORMAL HIGH (ref 5–15)
BUN: 37 mg/dL — ABNORMAL HIGH (ref 8–23)
CO2: 28 mmol/L (ref 22–32)
Calcium: 10 mg/dL (ref 8.9–10.3)
Chloride: 90 mmol/L — ABNORMAL LOW (ref 98–111)
Creatinine, Ser: 8.76 mg/dL — ABNORMAL HIGH (ref 0.44–1.00)
GFR, Estimated: 5 mL/min — ABNORMAL LOW (ref >60)
Glucose, Bld: 110 mg/dL — ABNORMAL HIGH (ref 70–99)
Potassium: 3.8 mmol/L (ref 3.5–5.1)
Sodium: 134 mmol/L — ABNORMAL LOW (ref 135–145)

## 2020-02-11 LAB — LACTIC ACID, PLASMA: Lactic Acid, Venous: 1.6 mmol/L (ref 0.5–1.9)

## 2020-02-11 MED ORDER — TRAMADOL HCL 50 MG PO TABS
50.0000 mg | ORAL_TABLET | Freq: Two times a day (BID) | ORAL | Status: DC | PRN
  Administered 2020-02-12: 50 mg via ORAL
  Filled 2020-02-11: qty 1

## 2020-02-11 MED ORDER — ONDANSETRON HCL 4 MG/2ML IJ SOLN
4.0000 mg | Freq: Once | INTRAMUSCULAR | Status: AC
  Administered 2020-02-11: 4 mg via INTRAVENOUS
  Filled 2020-02-11: qty 2

## 2020-02-11 MED ORDER — PIPERACILLIN-TAZOBACTAM IN DEX 2-0.25 GM/50ML IV SOLN
2.2500 g | Freq: Three times a day (TID) | INTRAVENOUS | Status: DC
  Filled 2020-02-11 (×2): qty 50

## 2020-02-11 MED ORDER — ASPIRIN EC 81 MG PO TBEC
81.0000 mg | DELAYED_RELEASE_TABLET | Freq: Every day | ORAL | Status: DC
  Administered 2020-02-12 – 2020-02-13 (×2): 81 mg via ORAL
  Filled 2020-02-11 (×2): qty 1

## 2020-02-11 MED ORDER — ACETAMINOPHEN 325 MG PO TABS
650.0000 mg | ORAL_TABLET | Freq: Four times a day (QID) | ORAL | Status: DC
  Administered 2020-02-12 (×2): 650 mg via ORAL
  Filled 2020-02-11 (×2): qty 2

## 2020-02-11 MED ORDER — IOHEXOL 300 MG/ML  SOLN
100.0000 mL | Freq: Once | INTRAMUSCULAR | Status: AC | PRN
  Administered 2020-02-11: 100 mL via INTRAVENOUS

## 2020-02-11 MED ORDER — ATORVASTATIN CALCIUM 80 MG PO TABS
80.0000 mg | ORAL_TABLET | Freq: Every day | ORAL | Status: DC
  Administered 2020-02-12 (×2): 80 mg via ORAL
  Filled 2020-02-11: qty 2
  Filled 2020-02-11: qty 1

## 2020-02-11 MED ORDER — SODIUM CHLORIDE 0.9 % IV BOLUS
500.0000 mL | Freq: Once | INTRAVENOUS | Status: AC
  Administered 2020-02-11: 500 mL via INTRAVENOUS

## 2020-02-11 MED ORDER — HYDROMORPHONE HCL 1 MG/ML IJ SOLN
0.5000 mg | Freq: Once | INTRAMUSCULAR | Status: AC
Start: 2020-02-11 — End: 2020-02-11
  Administered 2020-02-11: 0.5 mg via INTRAVENOUS
  Filled 2020-02-11: qty 1

## 2020-02-11 MED ORDER — HEPARIN SODIUM (PORCINE) 5000 UNIT/ML IJ SOLN
5000.0000 [IU] | Freq: Three times a day (TID) | INTRAMUSCULAR | Status: DC
  Administered 2020-02-12 – 2020-02-23 (×34): 5000 [IU] via SUBCUTANEOUS
  Filled 2020-02-11 (×34): qty 1

## 2020-02-11 MED ORDER — VANCOMYCIN HCL 2000 MG/400ML IV SOLN
2000.0000 mg | Freq: Once | INTRAVENOUS | Status: AC
  Administered 2020-02-11: 2000 mg via INTRAVENOUS
  Filled 2020-02-11: qty 400

## 2020-02-11 MED ORDER — VANCOMYCIN HCL IN DEXTROSE 1-5 GM/200ML-% IV SOLN: 1000.0000 mg | INTRAVENOUS | Status: DC

## 2020-02-11 NOTE — H&P (Incomplete)
[facility] Admission History and Physical Service Pager: [phone]  Patient name: Rachel Chaney Medical record number: [id number] Date of birth: [birth year] Age: 60 y.o. Gender: female  Primary Care Provider: Benay Pike, MD Consultants: None Code Status: FULL  Preferred Emergency Contact: Krisy Dix (husband) [phone]  Chief Complaint: "Skin pain"  Assessment and Plan: Rachel Chaney is a 60 y.o. female presenting with "skin pain". PMH is significant for ESRD on HD MWF, chronic hypoxic respiratory failure, T2DM, HFpEF, HTN, h/o MI (2020), HLD.  Acute on Chronic Hypoxic Respiratory Failure  COVID-19 Patient presented for increased pain related to her skin wounds. In the ED she was initially stable on room air, but later noted to be hypoxic to 76%. Of note, patient uses 3L O2 at home most days. She tested positive for COVID-19 on 01/31/2020 and received remdesevir x2 during prior admission. CXR today shows patchy bilateral infiltrates c/w multifocal PNA. ABG was obtained in the ED and showed pH 7.345, pO2 64, pCO2 59, bicarb 32.2. Etiology of her hypoxemia likely multifactorial secondary to COVID-19,  -Admit to FPTS, med-surg, attending Dr. 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 She has been seen multiple times by PCP and in the ED for her symptoms. She has trialed several different creams including Triamcinolone,  Biopsy on ***/*** consistent with lichen simplex chronicus. 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  Home meds: Tramadol  -Tylenol 676m q6h scheduled -Tramadol 521mq12h prn -***  Leukocytosis WBC 34.6 on admission. Patient with a  hx of persistent leukocytosis, last seen by hematology in 2016. Patient did receive vancomycin and Zosyn x1 in the ED. Given patient's history, this is less likely infectious-- patient is afebrile, lactic acid 1.6, skin wounds not consistent with cellulitis (no warmth or surrounding erythema, no abscess), no source identified on CXR (other than COVID) or CT abdomen/pelvis. -am CBC -f/u blood cx results -Will d/c antibiotics -Outpatient heme-onc f/u  ESRD on HD MWF  Anemia of chronic disease Chronic, stable. On admission: Cr 8.76, anion gap 16, chloride 90, electrolytes otherwise wnl. Hgb 11.3, which is her baseline. Last HD yesterday, Wednesday 2/1.  -Will consult nephro for HD -Daily renal function panel/CBC  T2DM Glucose 110 on arrival. Was previously on insulin at home, but this was discontinued during her most recent admission (02/01/2020) due to hypoglycemia. Last A1c on 11/12/2019 was 9.0.  -CBG monitoring qAC and qHS -Recheck hemoglobin A1c.   FEN/GI: Renal diet Prophylaxis: Heparin  Disposition: med-surg  History of Present Illness:  Rachel Chaney a [age].o. female presenting with skin pain. Patient states the skin on her legs has become more painful over the last ~3 days. She has reportedly been using a cream, (  Today her skin on her legs has been bothering. Cream that her  Uses 3L of oxygen at home during the day and CPAP at night.   Review Of Systems: Per HPI with the following additions: ***  Review of Systems  Constitutional: Negative for chills and fever.  HENT: Negative for congestion, rhinorrhea and sore throat.   Eyes: Negative for visual disturbance.  Respiratory: Negative for shortness of breath.   Cardiovascular: Negative for chest pain.  Gastrointestinal: Negative for abdominal pain, nausea  and vomiting.  Musculoskeletal: Positive for myalgias.  Skin: Positive for rash.  Neurological: Negative for headaches.     Patient Active Problem List    Diagnosis Date Noted  . COVID-19 01/31/2020  . Elevated troponin   . 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 Blurred vision 01/28/2020  . Rash and nonspecific skin eruption 01/15/2020  . Infestation by bed bug 01/15/2020  . Xerosis of skin 01/06/2020  . Hemorrhoids 11/25/2019  . Papular rash 11/25/2019  . Obstructive sleep apnea 11/12/2019  . Hypercapnic respiratory failure ([place]) 11/12/2019  . Seizure-like activity ([place])   . S/P transmetatarsal amputation of foot, right ([place]) 04/24/2018  . PAF (paroxysmal atrial fibrillation) ([place])   . Oxygen dependent   . Type 2 diabetes mellitus with diabetic neuropathy ([place])   . Severe protein-calorie malnutrition ([place])   . Seasonal allergies 04/12/2017  . Post-menopausal bleeding 12/07/2015  . ESRD (end stage renal disease) on dialysis ([place]) 05/25/2015  . Hypoglycemia 04/12/2015  . Hypoalbuminemia   . Leukocytosis 05/17/2013  . Abnormal appearance of cervix 08/08/2011  . Back pain 08/07/2011  . GERD (gastroesophageal reflux disease) 04/27/2010  . Hyperlipidemia 03/08/2006  . Morbid obesity ([place]) 03/08/2006  . Essential hypertension with morbid obesity 03/08/2006    Past Medical History: Past Medical History:  Diagnosis Date  . Acute respiratory failure with hypoxia ([place])   . Cardiac arrest ([place]) 02/28/2018   while on hemodialysis in hospital  . CHF (congestive heart failure) ([place])   . Chronic kidney disease due to diabetes mellitus ([place]) 10/13/2013  . Community acquired bilateral lower lobe pneumonia 11/11/2019  . Diabetes mellitus    Type II  . ESRD (end stage renal disease) ([place])    MWF - [facility]  . Excess fluid volume 12/16/2019  . Hyperlipidemia   . Hypertension   . Neuropathy     Past Surgical History: Past Surgical History:  Procedure Laterality Date  . AMPUTATION Right 02/27/2018   Procedure: RIGHT FOOT 2ND AND 3RD RAY AMPUTATION;  Surgeon: [name], MD;  Location: [facility];  Service: Orthopedics;  Laterality: Right;   . AMPUTATION Right 04/24/2018   Procedure: RIGHT TRANSMETATARSAL AMPUTATION, PLACE VAC;  Surgeon: [name], MD;  Location: [place];  Service: Orthopedics;  Laterality: Right;  . AV FISTULA PLACEMENT Left 04/15/2015   Procedure: ARTERIOVENOUS (AV) FISTULA CREATION;  Surgeon: Mal Misty, MD;  Location: [facility];  Service: Vascular;  Laterality: Left;  . EYE SURGERY Right    retinal detachment  . FISTULA SUPERFICIALIZATION Left 06/03/2015   Procedure: LEFT UPPER ARM BRACHIOCEPHALIC FISTULA SUPERFICIALIZATION;  Surgeon: Mal Misty, MD;  Location: [facility];  Service: Vascular;  Laterality: Left;  from [place] to [facility]  . STUMP REVISION Right 05/24/2018   Procedure: REVISION RIGHT TRANSMETATARSAL AMPUTATION WITH EXCISION BONE/SOFT TISSUE, LOCAL TISSUE  RE-ARRANGEMENT  AND VAC PLACEMENT;  Surgeon: [name], MD;  Location: [facility];  Service: Orthopedics;  Laterality: Right;    Social History: Social History   Tobacco Use  . Smoking status: Never Smoker  . Smokeless tobacco: Never Used  Vaping Use  . Vaping Use: Never used  Substance Use Topics  . Alcohol use: No    Alcohol/week: 0.0 standard drinks  . Drug use: No    Family History: Family History  Problem Relation Age of Onset  . Heart disease Mother   . Heart disease Father     Allergies and Medications: No Known Allergies No current facility-administered medications on file prior to  encounter.   Current Outpatient Medications on File Prior to Encounter  Medication Sig Dispense Refill  . acetaminophen (TYLENOL) 500 MG tablet Take 1,000 mg by mouth every 6 (six) hours as needed for mild pain or headache.     Marland Kitchen aspirin EC 81 MG tablet Take 1 tablet (81 mg total) by mouth daily. 90 tablet 3  . atorvastatin (LIPITOR) 80 MG tablet TAKE 1 TABLET(80 MG) BY MOUTH AT BEDTIME (Patient taking differently: Take 80 mg by mouth at bedtime.) 90 tablet 3  . Blood Glucose Monitoring Suppl (ONE TOUCH ULTRA 2) w/Device KIT 1 kit by Does not  apply route 3 (three) times daily. ICD-10 code: E11.40 1 each 0  . cetirizine (ZYRTEC) 10 MG tablet Take 10 mg by mouth as needed for allergies.     . cyclobenzaprine (FLEXERIL) 10 MG tablet TAKE 1 TABLET(10 MG) BY MOUTH THREE TIMES DAILY BETWEEN MEALS AND AT BEDTIME AS NEEDED FOR MUSCLE SPASMS (Patient taking differently: Take 10 mg by mouth 3 (three) times daily as needed for muscle spasms.) 30 tablet 0  . Doxepin HCl 5 % CREA Apply to affected area up to 4 times a day as needed for up to eight days. 30 g 1  . fluticasone (FLONASE) 50 MCG/ACT nasal spray Place 1 spray into both nostrils daily. (Patient taking differently: Place 1 spray into both nostrils daily as needed for allergies.) 16 g 5  . glucose blood (ONE TOUCH ULTRA TEST) test strip 1 each by Other route 3 (three) times daily. ICD-10 code: E11.40. 100 each 0  . HYDROcodone-acetaminophen (NORCO/VICODIN) 5-325 MG tablet Take 1-2 tablets by mouth every 6 (six) hours as needed. (Patient taking differently: Take 1-2 tablets by mouth every 6 (six) hours as needed for moderate pain.) 8 tablet 0  . hydrOXYzine (ATARAX/VISTARIL) 10 MG tablet Take 1 tablet (10 mg total) by mouth daily as needed for itching. 15 tablet 0  . Insulin Pen Needle (B-D UF III MINI PEN NEEDLES) 31G X 5 MM MISC USE AS DIRECTED TWICE DAILY 200 each 0  . Insulin Pen Needle 29G X 12MM MISC Inject 1 pen into the skin 2 (two) times daily. Check blood sugar daily. 100 each 0  . Insulin Syringe-Needle U-100 (ULTICARE INSULIN SYRINGE) 30G X 5/16" 1 ML MISC USE AS DIRECTED 100 each 0  . Iron Sucrose (VENOFER IV) Inject 1 Dose as directed See admin instructions. With dialysis    . OneTouch Delica Lancets 10C MISC 1 Device by Does not apply route as needed (to check blood glucose). 100 each 0  . traMADol (ULTRAM) 50 MG tablet Take 1 tablet (50 mg total) by mouth every 12 (twelve) hours as needed for severe pain. 60 tablet 0  . ferric citrate (AURYXIA) 1 GM 210 MG(Fe) tablet Take 2  tablets (420 mg total) by mouth 3 (three) times daily with meals. Give two (420 mg) by mouth three times a day wih meals for ESRD (Patient not taking: Reported on 02/27/2020) 180 tablet 0  . nystatin ointment (MYCOSTATIN) Apply 1 application topically 2 (two) times daily. (Patient not taking: Reported on 01/31/2020)    . triamcinolone ointment (KENALOG) 0.5 % Apply 1 application topically 2 (two) times daily. (Patient not taking: Reported on 01/31/2020)    . [DISCONTINUED] progesterone (PROMETRIUM) 200 MG capsule Take 1 capsule (200 mg total) by mouth daily. Take for 10 days if bleeding occurs for more than 4 days. 10 capsule 1    Objective: BP (!) 110/52  Pulse 94   Temp 98.5 F (36.9 C) (Oral)   Resp 17   Ht _0  (1.626 m)   Wt 81.6 kg   LMP 09/18/2011   SpO2 99%   BMI 30.90 kg/m  Exam: General: *** Eyes: *** ENTM: *** Neck: *** Cardiovascular: *** Respiratory: *** Gastrointestinal: *** MSK: *** Derm: *** Neuro: *** Psych: ***  Labs and Imaging: CBC BMET  Recent Labs  Lab 03/04/2020 1053 02/19/2020 2045  WBC 34.6*  --   HGB 11.3* 11.9*  HCT 38.1 35.0*  PLT 331  --    Recent Labs  Lab 02/24/2020 1053 03/03/2020 2045  NA 134* 133*  K 3.8 4.1  CL 90*  --   CO2 28  --   BUN 37*  --   CREATININE 8.76*  --   GLUCOSE 110*  --   CALCIUM 10.0  --       DG Chest 2 View Result Date: 02/19/2020 IMPRESSION: Patchy BILATERAL pulmonary infiltrates favor multifocal pneumonia over pulmonary edema.  CT ABDOMEN PELVIS W CONTRAST Result Date: 02/17/2020 IMPRESSION: 1. No acute abnormality. 2. Multiple chronic findings as detailed above, not substantially changed from recent prior study.   CT FEMUR LEFT W CONTRAST CT FEMUR RIGHT W CONTRAST Result Date: 03/08/2020 IMPRESSION: 1. No acute osseous abnormality. 2. There is nonspecific lower extremity edema without evidence for an abscess. Findings are nonspecific but can be seen in patients with cellulitis in the appropriate clinical  setting. Other differential considerations include chronic venous insufficiency given the presence of subcutaneous calcifications. 3. No radiographic evidence for osteomyelitis. 4.  Aortic Atherosclerosis (ICD10-I70.0).    Alcus Dad, MD 02/19/2020, 10:11 PM PGY-1, [place] Intern pager: [phone], text pages welcome

## 2020-02-11 NOTE — ED Provider Notes (Addendum)
[facility] EMERGENCY DEPARTMENT Provider Note   CSN: [id number] Arrival date & time: 02/26/2020  0277     History No chief complaint on file.   Rachel Chaney is a 60 y.o. female.  Patient with history of end-stage renal disease on hemodialysis (M/W/F), congestive heart failure, insulin-dependent diabetes, paroxysmal atrial fibrillation, previous right transmetatarsal amputation, chronic respiratory failure on 3L [place] at home -- presents to the emergency department for pain in her lower extremities and abdomen.  Patient states that she has several painful areas which started about 3 weeks ago, however review of patient records show that he she has been seen for this since at least last December.  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  She states that these areas have continued to worsen and enlarged.  She has developed areas on the right hip and over the lower abdomen.  Skin is dark in these areas and she has associated induration.  She denies fever.  She has an occasional cough but denies shortness of breath.  No URI symptoms.  She tested positive for Covid during recent admission but had minimal symptoms.  Patient has a history of leukocytosis which fluctuates, thought due to underlying chronic illnesses.  It appears she was recently referred to hematology for this.  Patient also reports having a fall yesterday down approximately 2 steps.  She states that she lost her balance and landed on her bottom.  She describes the pain is burning.  She has tried Norco without relief.        Past Medical History:  Diagnosis Date  . Acute respiratory failure with hypoxia ([place])   . Cardiac arrest ([place]) 02/28/2018   while on hemodialysis in hospital  . CHF (congestive heart failure) ([place])   . Chronic kidney disease due to diabetes mellitus ([place]) 10/13/2013   . Community acquired bilateral lower lobe pneumonia 11/11/2019  . Diabetes mellitus    Type II  . ESRD (end stage renal disease) ([place])    MWF - [facility]  . Excess fluid volume 12/16/2019  . Hyperlipidemia   . Hypertension   . Neuropathy     Patient Active Problem List   Diagnosis Date Noted  . COVID-19 01/31/2020  . Elevated troponin   . 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 Blurred vision 01/28/2020  . Rash and nonspecific skin eruption 01/15/2020  . Infestation by bed bug 01/15/2020  . Xerosis of skin 01/06/2020  . Hemorrhoids 11/25/2019  . Papular rash 11/25/2019  . Obstructive sleep apnea 11/12/2019  . Hypercapnic respiratory failure ([place]) 11/12/2019  . Seizure-like activity ([place])   . S/P transmetatarsal amputation of foot, right ([place]) 04/24/2018  . PAF (paroxysmal atrial fibrillation) ([place])   . Oxygen dependent   . Type 2 diabetes mellitus with diabetic neuropathy ([place])   . Severe protein-calorie malnutrition ([place])   . Seasonal allergies 04/12/2017  . Post-menopausal bleeding 12/07/2015  . ESRD (end stage renal disease) on dialysis ([place]) 05/25/2015  . Hypoglycemia 04/12/2015  . Hypoalbuminemia   . Leukocytosis 05/17/2013  . Abnormal appearance of cervix 08/08/2011  . Back pain 08/07/2011  . GERD (gastroesophageal reflux disease) 04/27/2010  . Hyperlipidemia 03/08/2006  . Morbid obesity ([place]) 03/08/2006  . Essential hypertension with morbid obesity 03/08/2006    Past Surgical History:  Procedure Laterality Date  . AMPUTATION Right 02/27/2018   Procedure: RIGHT FOOT  2ND AND 3RD RAY AMPUTATION;  Surgeon: [name], MD;  Location: [place];  Service: Orthopedics;  Laterality: Right;  . AMPUTATION Right 04/24/2018   Procedure: RIGHT TRANSMETATARSAL AMPUTATION, PLACE VAC;  Surgeon: [name], MD;  Location: [place];  Service: Orthopedics;  Laterality: Right;  . AV FISTULA PLACEMENT Left 04/15/2015   Procedure: ARTERIOVENOUS (AV) FISTULA CREATION;  Surgeon: Mal Misty, MD;  Location: [place];  Service: Vascular;  Laterality: Left;  . EYE SURGERY Right    retinal detachment  . FISTULA SUPERFICIALIZATION Left 06/03/2015   Procedure: LEFT UPPER ARM BRACHIOCEPHALIC FISTULA SUPERFICIALIZATION;  Surgeon: Mal Misty, MD;  Location: [place];  Service: Vascular;  Laterality: Left;  from [place] to General  . STUMP REVISION Right 05/24/2018   Procedure: REVISION RIGHT TRANSMETATARSAL AMPUTATION WITH EXCISION BONE/SOFT TISSUE, LOCAL TISSUE  RE-ARRANGEMENT  AND VAC PLACEMENT;  Surgeon: [name], MD;  Location: [place];  Service: Orthopedics;  Laterality: Right;     OB History   No obstetric history on file.     Family History  Problem Relation Age of Onset  . Heart disease Mother   . Heart disease Father     Social History   Tobacco Use  . Smoking status: Never Smoker  . Smokeless tobacco: Never Used  Vaping Use  . Vaping Use: Never used  Substance Use Topics  . Alcohol use: No    Alcohol/week: 0.0 standard drinks  . Drug use: No    Home Medications Prior to Admission medications   Medication Sig Start Date End Date Taking? Authorizing Provider  acetaminophen (TYLENOL) 500 MG tablet Take 1,000 mg by mouth every 6 (six) hours as needed for mild pain or headache.     [provider]  aspirin EC 81 MG tablet Take 1 tablet (81 mg total) by mouth daily. 04/30/18   Erlene Quan, PA-C  atorvastatin (LIPITOR) 80 MG tablet TAKE 1 TABLET(80 MG) BY MOUTH AT BEDTIME Patient taking differently: Take 80 mg by mouth at bedtime. 11/04/19   Benay Pike, MD  Blood Glucose Monitoring Suppl (ONE TOUCH ULTRA 2) w/Device KIT 1 kit by Does not apply route 3 (three) times daily. ICD-10 code: E11.40 03/26/18   Wille Celeste, PA-C  cetirizine (ZYRTEC) 10 MG tablet Take 10 mg by mouth as needed for allergies.  04/26/18   [provider]  cyclobenzaprine (FLEXERIL) 10 MG tablet TAKE 1 TABLET(10 MG) BY MOUTH THREE TIMES DAILY BETWEEN MEALS AND AT  BEDTIME AS NEEDED FOR MUSCLE SPASMS Patient taking differently: Take 10 mg by mouth 3 (three) times daily as needed for muscle spasms. 12/09/19   Benay Pike, MD  Doxepin HCl 5 % CREA Apply to affected area up to 4 times a day as needed for up to eight days. 01/08/20   Benay Pike, MD  ferric citrate (AURYXIA) 1 GM 210 MG(Fe) tablet Take 2 tablets (420 mg total) by mouth 3 (three) times daily with meals. Give two (420 mg) by mouth three times a day wih meals for ESRD Patient taking differently: Take 210-420 mg by mouth See admin instructions. Take 420 mg with each meal and 210 mg with snacks 03/26/18   Lassen, Arlo C, PA-C  fluticasone (FLONASE) 50 MCG/ACT nasal spray Place 1 spray into both nostrils daily. Patient taking differently: Place 1 spray into both nostrils daily as needed. 06/16/19   Benay Pike, MD  glucose blood (ONE TOUCH ULTRA TEST) test strip  1 each by Other route 3 (three) times daily. ICD-10 code: E11.40. 03/26/18   Wille Celeste, PA-C  HYDROcodone-acetaminophen (NORCO/VICODIN) 5-325 MG tablet Take 1-2 tablets by mouth every 6 (six) hours as needed. 02/05/20   Davonna Belling, MD  hydrOXYzine (ATARAX/VISTARIL) 10 MG tablet Take 1 tablet (10 mg total) by mouth daily as needed for itching. 01/15/20   [name], DO  Insulin Pen Needle (B-D UF III MINI PEN NEEDLES) 31G X 5 MM MISC USE AS DIRECTED TWICE DAILY 04/30/19   Benay Pike, MD  Insulin Pen Needle 29G X 12MM MISC Inject 1 pen into the skin 2 (two) times daily. Check blood sugar daily. 03/26/18   [place] Lewis C, PA-C  Insulin Syringe-Needle U-100 Flossie Buffy INSULIN SYRINGE) 30G X 5/16" 1 ML MISC USE AS DIRECTED 03/26/18   [place] Lewis C, PA-C  multivitamin (RENA-VIT) TABS tablet Take 1 tablet by mouth daily.    [provider]  nystatin ointment (MYCOSTATIN) Apply 1 application topically 2 (two) times daily. Patient not taking: Reported on 01/31/2020    [provider]  OneTouch Delica Lancets 58N  MISC 1 Device by Does not apply route as needed (to check blood glucose). 03/26/18   [place] Lewis C, PA-C  traMADol (ULTRAM) 50 MG tablet Take 1 tablet (50 mg total) by mouth every 12 (twelve) hours as needed for severe pain. 01/28/20 02/27/20  Meccariello, Bernita Raisin, DO  triamcinolone ointment (KENALOG) 0.5 % Apply 1 application topically 2 (two) times daily. Patient not taking: Reported on 01/31/2020    [provider]  progesterone (PROMETRIUM) 200 MG capsule Take 1 capsule (200 mg total) by mouth daily. Take for 10 days if bleeding occurs for more than 4 days. 11/09/10 03/28/11  Lyndal Pulley, DO    Allergies    Patient has no known allergies.  Review of Systems   Review of Systems  Constitutional: Negative for fever.  HENT: Negative for rhinorrhea and sore throat.   Eyes: Negative for redness.  Respiratory: Negative for cough and shortness of breath.   Cardiovascular: Negative for chest pain.  Gastrointestinal: Negative for abdominal pain, diarrhea, nausea and vomiting.  Genitourinary: Negative for flank pain.  Musculoskeletal: Positive for arthralgias and myalgias.  Skin: Positive for color change and rash. Negative for wound.  Neurological: Negative for headaches.    Physical Exam Updated Vital Signs BP 110/71   Pulse 85   Temp 98.5 F (36.9 C) (Oral)   Resp 17   LMP 09/18/2011   SpO2 95%   Physical Exam Vitals and nursing note reviewed.  Constitutional:      General: She is not in acute distress.    Appearance: She is well-developed.  HENT:     Head: Normocephalic and atraumatic.     Right Ear: External ear normal.     Left Ear: External ear normal.     Nose: Nose normal.  Eyes:     Conjunctiva/sclera: Conjunctivae normal.  Cardiovascular:     Rate and Rhythm: Normal rate and regular rhythm.     Heart sounds: No murmur heard.   Pulmonary:     Effort: No respiratory distress.     Breath sounds: No wheezing, rhonchi or rales.  Abdominal:      Palpations: Abdomen is soft.     Tenderness: There is no abdominal tenderness. There is no guarding or rebound.  Musculoskeletal:     Cervical back: Normal range of motion and neck supple.     Right  lower leg: No edema.     Left lower leg: No edema.     Comments: Right upper extremity fistula -- appears normal.  Skin:    General: Skin is warm and dry.     Findings: Rash present.     Comments: Patient with multiple areas of induration, multiple centimeters across (as shown in pictures). Areas on left and right thigh are non-fluctuant, tender, not warm.  Area on right lateral thigh is non-fluctuant, tender, slightly warm.  Area on lower abdominal wall is non-fluctuant, tender, slightly warm.  Neurological:     General: No focal deficit present.     Mental Status: She is alert. Mental status is at baseline.     Motor: No weakness.  Psychiatric:        Mood and Affect: Mood normal.        ED Results / Procedures / Treatments   Labs (all labs ordered are listed, but only abnormal results are displayed) Labs Reviewed  CBC WITH DIFFERENTIAL/PLATELET - Abnormal; Notable for the following components:      Result Value   WBC 34.6 (*)    RBC 3.68 (*)    Hemoglobin 11.3 (*)    MCV 103.5 (*)    MCHC 29.7 (*)    RDW 18.3 (*)    Neutro Abs 29.1 (*)    Monocytes Absolute 2.4 (*)    Abs Immature Granulocytes 1.31 (*)    All other components within normal limits  BASIC METABOLIC PANEL - Abnormal; Notable for the following components:   Sodium 134 (*)    Chloride 90 (*)    Glucose, Bld 110 (*)    BUN 37 (*)    Creatinine, Ser 8.76 (*)    GFR, Estimated 5 (*)    Anion gap 16 (*)    All other components within normal limits  I-STAT ARTERIAL BLOOD GAS, ED - Abnormal; Notable for the following components:   pH, Arterial 7.345 (*)    pCO2 arterial 58.9 (*)    pO2, Arterial 64 (*)    Bicarbonate 32.2 (*)    TCO2 34 (*)    Acid-Base Excess 5.0 (*)    Sodium 133 (*)    HCT 35.0 (*)     Hemoglobin 11.9 (*)    All other components within normal limits  CULTURE, BLOOD (ROUTINE X 2)  CULTURE, BLOOD (ROUTINE X 2)  LACTIC ACID, PLASMA  BLOOD GAS, ARTERIAL    EKG None  Radiology DG Chest 2 View  Result Date: 02/18/2020 CLINICAL DATA:  Cough leukocytosis EXAM: CHEST - 2 VIEW COMPARISON:  02/05/2020 FINDINGS: Enlargement of cardiac silhouette. Clothing artifacts project over chest. Mediastinal contours and pulmonary vascularity normal. Atherosclerotic calcification aorta. Patchy BILATERAL pulmonary infiltrates which could represent multifocal pneumonia or less likely pulmonary edema. Slight improvement in aeration in the RIGHT mid lung and LEFT upper lobe since prior study. No pleural effusion or pneumothorax. Osseous structures unremarkable. IMPRESSION: Patchy BILATERAL pulmonary infiltrates favor multifocal pneumonia over pulmonary edema. Electronically Signed   By: Lavonia Dana M.D.   On: 02/13/2020 17:59   CT ABDOMEN PELVIS W CONTRAST  Result Date: 02/25/2020 CLINICAL DATA:  Abdominal pain.  Abscess. EXAM: CT ABDOMEN AND PELVIS WITH CONTRAST TECHNIQUE: Multidetector CT imaging of the abdomen and pelvis was performed using the standard protocol following bolus administration of intravenous contrast. CONTRAST:  143m OMNIPAQUE IOHEXOL 300 MG/ML  SOLN COMPARISON:  January 31, 2020 FINDINGS: Lower chest: There is atelectasis and scarring at the lung bases,  similar to prior study.The heart is enlarged. Hepatobiliary: There is probable hepatic steatosis. There is gallbladder sludge without CT evidence for acute cholecystitis.There is no biliary ductal dilation. Pancreas: Normal contours without ductal dilatation. No peripancreatic fluid collection. Spleen: Unremarkable. Adrenals/Urinary Tract: --Adrenal glands: Unremarkable. --Right kidney/ureter: The right kidney is atrophic. --Left kidney/ureter: The left kidney is atrophic. --Urinary bladder: Bladder is decompressed which limits evaluation.  Stomach/Bowel: --Stomach/Duodenum: No hiatal hernia or other gastric abnormality. Normal duodenal course and caliber. --Small bowel: Unremarkable. --Colon: There is scattered colonic diverticula without CT evidence for diverticulitis. --Appendix: The appendix is dilated but air-filled and does not demonstrate surrounding inflammatory changes. Vascular/Lymphatic: Atherosclerotic calcification is present within the non-aneurysmal abdominal aorta, without hemodynamically significant stenosis. --No retroperitoneal lymphadenopathy. --No mesenteric lymphadenopathy. --No pelvic or inguinal lymphadenopathy. Reproductive: Unremarkable Other: There is diffuse subcutaneous fat stranding involving the posterior and anterior abdominal walls. This is essentially stable across prior studies. The abdominal wall is normal. Musculoskeletal. No acute displaced fractures. IMPRESSION: 1. No acute abnormality. 2. Multiple chronic findings as detailed above, not substantially changed from recent prior study. Electronically Signed   By: Constance Holster M.D.   On: 02/28/2020 19:33   CT FEMUR LEFT W CONTRAST  Result Date: 02/20/2020 CLINICAL DATA:  Soft tissue mass of the thigh. Concern for cellulitis. EXAM: CT OF THE LOWER BILATERAL EXTREMITY WITH CONTRAST TECHNIQUE: Multidetector CT imaging of the lower bilateral extremities was performed according to the standard protocol following intravenous contrast administration. CONTRAST:  160m OMNIPAQUE IOHEXOL 300 MG/ML  SOLN COMPARISON:  None. FINDINGS: Evaluation of the patient's pelvis does not demonstrate an acute abnormality. There is no acute osseous abnormality involving the visualized osseous structures. There are advanced vascular calcifications bilaterally. There is nonspecific lower extremity soft tissue edema with multiple associated subcutaneous calcifications. There is no abscess. No drainable fluid collection. There is mild overlying skin thickening. Lower extremity muscular  atrophy is noted. There are no pockets subcutaneous gas. There are mild-to-moderate degenerative changes of both hips. IMPRESSION: 1. No acute osseous abnormality. 2. There is nonspecific lower extremity edema without evidence for an abscess. Findings are nonspecific but can be seen in patients with cellulitis in the appropriate clinical setting. Other differential considerations include chronic venous insufficiency given the presence of subcutaneous calcifications. 3. No radiographic evidence for osteomyelitis. 4.  Aortic Atherosclerosis (ICD10-I70.0). Electronically Signed   By: CConstance HolsterM.D.   On: 02/13/2020 19:39   CT FEMUR RIGHT W CONTRAST  Result Date: 03/04/2020 CLINICAL DATA:  Soft tissue mass of the thigh. Concern for cellulitis. EXAM: CT OF THE LOWER BILATERAL EXTREMITY WITH CONTRAST TECHNIQUE: Multidetector CT imaging of the lower bilateral extremities was performed according to the standard protocol following intravenous contrast administration. CONTRAST:  1055mOMNIPAQUE IOHEXOL 300 MG/ML  SOLN COMPARISON:  None. FINDINGS: Evaluation of the patient's pelvis does not demonstrate an acute abnormality. There is no acute osseous abnormality involving the visualized osseous structures. There are advanced vascular calcifications bilaterally. There is nonspecific lower extremity soft tissue edema with multiple associated subcutaneous calcifications. There is no abscess. No drainable fluid collection. There is mild overlying skin thickening. Lower extremity muscular atrophy is noted. There are no pockets subcutaneous gas. There are mild-to-moderate degenerative changes of both hips. IMPRESSION: 1. No acute osseous abnormality. 2. There is nonspecific lower extremity edema without evidence for an abscess. Findings are nonspecific but can be seen in patients with cellulitis in the appropriate clinical setting. Other differential considerations include chronic venous insufficiency given the presence  of  subcutaneous calcifications. 3. No radiographic evidence for osteomyelitis. 4.  Aortic Atherosclerosis (ICD10-I70.0). Electronically Signed   By: Constance Holster M.D.   On: 03/02/2020 19:39    Procedures Procedures   Medications Ordered in ED Medications  vancomycin (VANCOCIN) IVPB 1000 mg/200 mL premix (has no administration in time range)  piperacillin-tazobactam (ZOSYN) IVPB 2.25 g (has no administration in time range)  HYDROmorphone (DILAUDID) injection 0.5 mg (0.5 mg Intravenous Given 02/12/2020 1724)  ondansetron (ZOFRAN) injection 4 mg (4 mg Intravenous Given 03/04/2020 1723)  vancomycin (VANCOREADY) IVPB 2000 mg/400 mL (2,000 mg Intravenous New Bag/Given 02/16/2020 1914)  iohexol (OMNIPAQUE) 300 MG/ML solution 100 mL (100 mLs Intravenous Contrast Given 02/19/2020 1853)  sodium chloride 0.9 % bolus 500 mL (500 mLs Intravenous New Bag/Given 02/18/2020 1932)    ED Course  I have reviewed the triage vital signs and the nursing notes.  Pertinent labs & imaging results that were available during my care of the patient were reviewed by me and considered in my medical decision making (see chart for details).  Patient seen and examined. Work-up reviewed showing white blood cell count in the 30,000 range.  Patient has a history of fluctuating white blood cell count/leukocytosis which makes her clinical picture harder to evaluate.  However today she reports worsening of the areas on her bilateral legs, right hip and abdomen.  No fever here.  Discussed with Dr. Darl Householder.  Will need to further evaluate with imaging and treat for infection until we have a better grasp of what the areas are in her skin.  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  Biopsy showed lichen planus.  Will need to rule out cellulitis/abscess.  Dr. Darl Householder to see.  Vital signs reviewed and are as follows: BP 110/71   Pulse 85   Temp 98.5 F (36.9 C) (Oral)   Resp 17   LMP 09/18/2011   SpO2 95%   CXR shows multifocal PNA likely consistent with recent COVID  infection.   7:41 PM Notified earlier regarding mental status change. BP 44'R systolic. I went and re-evaluated patient. Suspect due to administration of dilaudid 0.90m. Pt follows commands but is slower to respond now.   Pt brought to CT and back. Seen by Dr. YDarl Householderafter found to be increasingly hypoxic. Placed on non-rebreather with some improvement. Will monitor. Also hypotensive now. 500cc fluid bolus ordered.   8:25 PM L EJ access obtained by Dr. YDarl Householderas L UE IV no longer functional after return from CT. Pt more alert but still not back to baseline. ABG pending.   10:10 PM ABG reviewed. Pt more awake now. On 4L [place]. Able to say name, birthday, current date.   [facility]     MDM Rules/Calculators/A&P                          Admit.    Final Clinical Impression(s) / ED Diagnoses Final diagnoses:  Leukocytosis, unspecified type  Cellulitis, unspecified cellulitis site  Transient alteration of awareness    Rx / DC Orders ED Discharge Orders    None       GCarlisle Cater PA-C 02/26/2020 2211    GCarlisle Cater PA-C 02/24/2020 2212    YDrenda Freeze MD 03/08/2020 [phone]

## 2020-02-11 NOTE — H&P (Addendum)
[facility] Admission History and Physical Service Pager: [phone]  Patient name: Rachel [facility] Medical record number: [id number] Date of birth: 1960-04-14 Age: 60 y.o. Gender: female  Primary Care Provider: Benay Pike, MD Consultants: None Code Status: FULL  Preferred Emergency Contact: Willma Obando (husband) [phone]  Chief Complaint: "Skin pain"  Assessment and Plan: LASAUNDRA RICHE is a 60 y.o. female presenting with "skin pain". PMH is significant for ESRD on HD MWF, chronic hypoxic respiratory failure, T2DM, HFpEF, HTN, h/o MI (2020), HLD.  Acute on Chronic Hypoxic Respiratory Failure  recent COVID-19 Patient presented for increased pain related to her skin lesions. In the ED she was initially stable on room air, but later noted to be hypoxic to 76%. Of note, patient uses 3L O2 at home but did not bring it with her today. She tested positive for COVID-19 on 01/31/2020 and received remdesevir x2 during prior admission. CXR today shows patchy bilateral infiltrates c/w multifocal PNA likely due to recent COVID infection. ABG was obtained in the ED and showed pH 7.345, pO2 64, pCO2 59, bicarb 32.2. Etiology of her hypoxemia likely multifactorial secondary to noncompliance with home O2 and COVID sequela infection.  Patient currently stable on 4L [place] with no respiratory distress.  ECHO from 02/01/20 with EF 60-65% and mildly elevated PA systolic pressure.  No signs of volume overload on exam and pt has not missed HD making heart failure less likely. Wells score is 0 therefore very little suspicion for PE. -Admit to FPTS, med-surg, attending Dr. 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 She has been seen multiple times by PCP and in the ED for her symptoms. 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 Multiple providers have also felt there is a component of calciphylaxis. No signs of cellulitis or abscess on exam/imaging today. Pt given 0.5 mg IV Dilaudid in the ED.  She has trialed Triamcinolone and Doxepin cream as well as hydroxyzine for itching and Tramadol 46m q12h prn for pain.  -Tylenol 6530mq6h scheduled -Tramadol 5027m12h prn -Continue Doxepin cream  Hypotension Pt given 0.5 mg IV Diluadid in the ED for pain. Shortly after returning for CT her BP dropped 80s/40s and became acutely confused. She lost her IV and ED provider placed a left EJ. She received a 500 cc bolus. BP on admission 110/52.  - monitor BP  - Avoid Dilaudid at this time.    Leukocytosis WBC 34.6 on admission. She does have a left shift, with increased monocytes and immature granulocytes. Patient with a hx of persistent leukocytosis, last seen by hematology in 2016. She did receive vancomycin in the ED. Given patient's history, this is less likely infectious-- patient is afebrile, lactic acid 1.6, skin wounds not consistent with cellulitis (no warmth or surrounding erythema, no abscess), no source identified on CT abdomen/pelvis and no bacterial PNA on CXR. Discontinue vancomycin. Will monitor for fever and follow up on blood cultures drawn in the ED.  -am CBC -f/u blood cx results -Discontinue Vancomycin  -Outpatient heme-onc f/u  ESRD on HD MWF  Anemia of chronic disease Chronic, stable. On admission: Cr 8.76, anion gap  16, chloride 90, electrolytes otherwise wnl. Hgb 11.3, which is her baseline. Last HD yesterday, Wednesday 2/1.  -Will consult nephro for HD in AM -Daily renal function panel/CBC -Renal dosing of meds as appropriate  T2DM Glucose 110 on arrival.  Was previously on insulin, but this was discontinued during her most recent admission (02/01/2020) due to hypoglycemia. Reports CBG 45 at home 2 days ago. Last A1c on 11/12/2019 was 9.0.  -CBG monitoring qAC and qHS -Recheck hemoglobin A1c -Outpatient PCP f/u  HLD Chronic, stable. Most recent lipid panel from 11/12/2019 total cholesterol 79, HDL 31, LDL 35, triglycerides 65. -Continue home atorvastatin 32m daily   FEN/GI: Renal diet with 1200 fluid restriction  Prophylaxis: Heparin  Disposition: med-surg  History of Present Illness:  Rachel SOKOLOSKIis a 60y.o. female presenting with skin pain. Patient states the skin on her legs has become more painful over the last ~3 days. She has reportedly been using a cream (doxepin per chart review) without significant relief. She is also prescribed Tramadol for the pain, but states she has not taken this in ~3 days. She does report temporary relief when she uses the Tramadol. Patient has been seen several times in clinic and in the ED for her skin lesions. 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 Also thought to be calciphylaxis. She has trialed triamcinolone cream and hydroxyzine in addition to the doxepin without improvement.  Patient has no additional complaints. She denies fever, chills, cough, congestion, shortness of breath or chest pain. Patient states she uses 3L of oxygen at home most days and CPAP at night (although is not always compliant).  She was recently diagnosed with COVID on 01/31/2020 and received Remdesevir x2 days. Patient is fully vaccinated but not boosted.  Review Of Systems: Per HPI with the following additions:   Review of Systems  Constitutional: Negative for chills and fever.  HENT: Negative for congestion, rhinorrhea and sore throat.   Eyes: Negative for visual disturbance.  Respiratory: Negative for shortness of breath.   Cardiovascular: Negative for chest pain.  Gastrointestinal: Negative for  abdominal pain, nausea and vomiting.  Musculoskeletal: Positive for myalgias.  Skin: Positive for rash.  Neurological: Negative for headaches.  Psychiatric/Behavioral: Positive for confusion.     Patient Active Problem List   Diagnosis Date Noted  . COVID-19 01/31/2020  . Elevated troponin   . 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 Blurred vision 01/28/2020  . Rash and nonspecific skin eruption 01/15/2020  . Infestation by bed bug 01/15/2020  . Xerosis of skin 01/06/2020  . Hemorrhoids 11/25/2019  . Papular rash 11/25/2019  . Obstructive sleep apnea 11/12/2019  . Hypercapnic respiratory failure ([place] 11/12/2019  . Seizure-like activity ([place]   . S/P transmetatarsal amputation of foot, right ([place] 04/24/2018  . PAF (paroxysmal atrial fibrillation) ([place]   . Oxygen dependent   . Type 2 diabetes mellitus with diabetic neuropathy ([place]   . Severe protein-calorie malnutrition ([place]   . Seasonal allergies 04/12/2017  . Post-menopausal bleeding 12/07/2015  . ESRD (end stage renal disease) on dialysis ([place] 05/25/2015  . Hypoglycemia 04/12/2015  . Hypoalbuminemia   . Leukocytosis 05/17/2013  . Abnormal appearance of cervix 08/08/2011  . Back pain 08/07/2011  . GERD (gastroesophageal reflux disease) 04/27/2010  . Hyperlipidemia 03/08/2006  . Morbid obesity ([place] 03/08/2006  . Essential hypertension with morbid obesity 03/08/2006    Past Medical History: Past Medical History:  Diagnosis Date  . Acute respiratory failure with hypoxia ([facility]   . Cardiac arrest (  [place]) 02/28/2018   while on hemodialysis in [place]  . CHF (congestive heart failure) ([place])   . Chronic kidney disease due to diabetes mellitus ([place]) 10/13/2013  . Community acquired bilateral lower lobe pneumonia 11/11/2019  . Diabetes mellitus    Type II  . ESRD (end stage renal disease) ([place])    MWF - [facility]  . Excess fluid volume 12/16/2019  . Hyperlipidemia   . Hypertension   . Neuropathy     Past Surgical  History: Past Surgical History:  Procedure Laterality Date  . AMPUTATION Right 02/27/2018   Procedure: RIGHT FOOT 2ND AND 3RD RAY AMPUTATION;  Surgeon: [name], MD;  Location: [place];  Service: Orthopedics;  Laterality: Right;  . AMPUTATION Right 04/24/2018   Procedure: RIGHT TRANSMETATARSAL AMPUTATION, PLACE VAC;  Surgeon: [name], MD;  Location: [place];  Service: Orthopedics;  Laterality: Right;  . AV FISTULA PLACEMENT Left 04/15/2015   Procedure: ARTERIOVENOUS (AV) FISTULA CREATION;  Surgeon: Mal Misty, MD;  Location: [place];  Service: Vascular;  Laterality: Left;  . EYE SURGERY Right    retinal detachment  . FISTULA SUPERFICIALIZATION Left 06/03/2015   Procedure: LEFT UPPER ARM BRACHIOCEPHALIC FISTULA SUPERFICIALIZATION;  Surgeon: Mal Misty, MD;  Location: [place];  Service: Vascular;  Laterality: Left;  from [place] to [facility]  . STUMP REVISION Right 05/24/2018   Procedure: REVISION RIGHT TRANSMETATARSAL AMPUTATION WITH EXCISION BONE/SOFT TISSUE, LOCAL TISSUE  RE-ARRANGEMENT  AND VAC PLACEMENT;  Surgeon: [name], MD;  Location: [place];  Service: Orthopedics;  Laterality: Right;    Social History: Social History   Tobacco Use  . Smoking status: Never Smoker  . Smokeless tobacco: Never Used  Vaping Use  . Vaping Use: Never used  Substance Use Topics  . Alcohol use: No    Alcohol/week: 0.0 standard drinks  . Drug use: No    Family History: Family History  Problem Relation Age of Onset  . Heart disease Mother   . Heart disease Father     Allergies and Medications: No Known Allergies No current facility-administered medications on file prior to encounter.   Current Outpatient Medications on File Prior to Encounter  Medication Sig Dispense Refill  . acetaminophen (TYLENOL) 500 MG tablet Take 1,000 mg by mouth every 6 (six) hours as needed for mild pain or headache.     Marland Kitchen aspirin EC 81 MG tablet Take 1 tablet (81 mg total) by mouth daily. 90 tablet 3  .  atorvastatin (LIPITOR) 80 MG tablet TAKE 1 TABLET(80 MG) BY MOUTH AT BEDTIME (Patient taking differently: Take 80 mg by mouth at bedtime.) 90 tablet 3  . Blood Glucose Monitoring Suppl (ONE TOUCH ULTRA 2) w/Device KIT 1 kit by Does not apply route 3 (three) times daily. ICD-10 code: E11.40 1 each 0  . cetirizine (ZYRTEC) 10 MG tablet Take 10 mg by mouth as needed for allergies.     . cyclobenzaprine (FLEXERIL) 10 MG tablet TAKE 1 TABLET(10 MG) BY MOUTH THREE TIMES DAILY BETWEEN MEALS AND AT BEDTIME AS NEEDED FOR MUSCLE SPASMS (Patient taking differently: Take 10 mg by mouth 3 (three) times daily as needed for muscle spasms.) 30 tablet 0  . Doxepin HCl 5 % CREA Apply to affected area up to 4 times a day as needed for up to eight days. 30 g 1  . fluticasone (FLONASE) 50 MCG/ACT nasal spray Place 1 spray into both nostrils daily. (Patient taking differently: Place 1 spray into both  nostrils daily as needed for allergies.) 16 g 5  . glucose blood (ONE TOUCH ULTRA TEST) test strip 1 each by Other route 3 (three) times daily. ICD-10 code: E11.40. 100 each 0  . HYDROcodone-acetaminophen (NORCO/VICODIN) 5-325 MG tablet Take 1-2 tablets by mouth every 6 (six) hours as needed. (Patient taking differently: Take 1-2 tablets by mouth every 6 (six) hours as needed for moderate pain.) 8 tablet 0  . hydrOXYzine (ATARAX/VISTARIL) 10 MG tablet Take 1 tablet (10 mg total) by mouth daily as needed for itching. 15 tablet 0  . Insulin Pen Needle (B-D UF III MINI PEN NEEDLES) 31G X 5 MM MISC USE AS DIRECTED TWICE DAILY 200 each 0  . Insulin Pen Needle 29G X 12MM MISC Inject 1 pen into the skin 2 (two) times daily. Check blood sugar daily. 100 each 0  . Insulin Syringe-Needle U-100 (ULTICARE INSULIN SYRINGE) 30G X 5/16" 1 ML MISC USE AS DIRECTED 100 each 0  . Iron Sucrose (VENOFER IV) Inject 1 Dose as directed See admin instructions. With dialysis    . OneTouch Delica Lancets 56Y MISC 1 Device by Does not apply route as needed  (to check blood glucose). 100 each 0  . traMADol (ULTRAM) 50 MG tablet Take 1 tablet (50 mg total) by mouth every 12 (twelve) hours as needed for severe pain. 60 tablet 0  . ferric citrate (AURYXIA) 1 GM 210 MG(Fe) tablet Take 2 tablets (420 mg total) by mouth 3 (three) times daily with meals. Give two (420 mg) by mouth three times a day wih meals for ESRD (Patient not taking: Reported on 02/14/2020) 180 tablet 0  . nystatin ointment (MYCOSTATIN) Apply 1 application topically 2 (two) times daily. (Patient not taking: Reported on 01/31/2020)    . triamcinolone ointment (KENALOG) 0.5 % Apply 1 application topically 2 (two) times daily. (Patient not taking: Reported on 01/31/2020)    . [DISCONTINUED] progesterone (PROMETRIUM) 200 MG capsule Take 1 capsule (200 mg total) by mouth daily. Take for 10 days if bleeding occurs for more than 4 days. 10 capsule 1    Objective: BP (!) 110/52   Pulse 94   Temp 98.5 F (36.9 C) (Oral)   Resp 17   Ht _0  (1.626 m)   Wt 81.6 kg   LMP 09/18/2011   SpO2 99%   BMI 30.90 kg/m  Exam: General: alert, resting comfortably in bed, NAD Eyes: PERRL ENTM: moist mucous membranes Cardiovascular: RRR, III/VI systolic murmur, LUE fistula  Respiratory: normal WOB on 4L [place], bibasilar crackles Gastrointestinal: +BS, soft, non-distended Extremities: right metatarsal amputation, moves all extremities appropriately  Neuro: alert and oriented x4, grossly intact, 5/5 strength in all extremities Psych: appropriate affect Derm: see media tab for additional images, no warmth, areas of induration, no fluctuance       Labs and Imaging: CBC BMET  Recent Labs  Lab 03/04/2020 1053 03/02/2020 2045  WBC 34.6*  --   HGB 11.3* 11.9*  HCT 38.1 35.0*  PLT 331  --    Recent Labs  Lab 02/28/2020 1053 02/18/2020 2045  NA 134* 133*  K 3.8 4.1  CL 90*  --   CO2 28  --   BUN 37*  --   CREATININE 8.76*  --   GLUCOSE 110*  --   CALCIUM 10.0  --       DG Chest 2 View Result  Date: 02/22/2020 IMPRESSION: Patchy BILATERAL pulmonary infiltrates favor multifocal pneumonia over pulmonary edema.  CT ABDOMEN PELVIS  W CONTRAST Result Date: 02/27/2020 IMPRESSION: 1. No acute abnormality. 2. Multiple chronic findings as detailed above, not substantially changed from recent prior study.   CT FEMUR LEFT W CONTRAST CT FEMUR RIGHT W CONTRAST Result Date: 03/02/2020 IMPRESSION: 1. No acute osseous abnormality. 2. There is nonspecific lower extremity edema without evidence for an abscess. Findings are nonspecific but can be seen in patients with cellulitis in the appropriate clinical setting. Other differential considerations include chronic venous insufficiency given the presence of subcutaneous calcifications. 3. No radiographic evidence for osteomyelitis. 4.  Aortic Atherosclerosis (ICD10-I70.0).    Alcus Dad, MD 02/27/2020, 10:11 PM PGY-1, [place] Intern pager: [phone], text pages welcome  FPTS Upper-Level Resident Addendum   I have independently interviewed and examined the patient. I have discussed the above with the original author and agree with their documentation. My edits for correction/addition/clarification are within the H&P. Please see also any attending notes.   Lyndee Hensen, DO PGY-2, [facility] 02/12/2020 2:09 AM  FPTS Service pager: [phone] (text pages welcome through [facility])

## 2020-02-11 NOTE — ED Notes (Signed)
Pt hypotensive, BP 87/42, and hypoxic at 76% [place]. Given 500ccNS bolus and placed on NRB.

## 2020-02-11 NOTE — ED Triage Notes (Signed)
Patient arrived by [facility] complaining of ongoing bilateral leg pain and swelling from knees to thighs, also complains of rash to same. Due for dialysis today

## 2020-02-11 NOTE — ED Notes (Signed)
Pt accidentally removed IV in right EJ, PIV in right AC, occluded.

## 2020-02-11 NOTE — Progress Notes (Addendum)
Pharmacy Antibiotic Note  Rachel [facility] is a 60 y.o. female admitted on 02/17/2020 with cellulitis vs abscess.  Patient endorses pain in her lower extremities and abdomen.  She did undergo a biopsy on the R leg with the concern for calciphylaxis.  States these have gotten worse. Patient states she has also developed a cough.  Patient receives hemodialysis on MWF.  Pharmacy has been consulted for vancomycin dosing.    WBC 34.6 (has hx of fluctuating leukocytosis), afebrile, Scr on admit 8.76 (BL~5-6)  Plan: Vancomycin 2000mg  IV x1 (using reported dry weight of 185 lb) Vancomycin 1000mg  IV qMWF after dialysis  Zosyn 2.25g IV q8h (added 2/2 PM) F/u HD plan - was due for dialysis today but has not received yet F/u CT abd/pelvis/lower extremity and CXR  Vanc levels as indicated Height: 5\' 4"  (162.6 cm) Weight: 81.6 kg (180 lb) IBW/kg (Calculated) : 54.7  Temp (24hrs), Avg:98.5 F (36.9 C), Min:98.4 F (36.9 C), Max:98.5 F (36.9 C)  Recent Labs  Lab 02/05/20 1051 02/29/2020 1053 03/03/2020 1657  WBC 13.3* 34.6*  --   CREATININE 9.23* 8.76*  --   LATICACIDVEN  --   --  1.6    Estimated Creatinine Clearance: 7 mL/min (A) (by C-G formula based on SCr of 8.76 mg/dL (H)).    No Known Allergies  Antimicrobials this admission: Vancomycin 2/2 >>  Zosyn 2/2 >>  Microbiology results: 2/2 BCx: pending  Thank you for allowing pharmacy to be a part of this patient's care.  Dimple Nanas, PharmD PGY-1 Acute Care Pharmacy Resident Office: [phone] 02/16/2020 8:24 PM

## 2020-02-11 NOTE — ED Notes (Signed)
Patient transported to X-ray 

## 2020-02-11 NOTE — ED Notes (Signed)
Went to reassess pt pain. Pt more difficult to arouse and would not answer all orienting questions. Pt would "dose" in between [place] PA made aware and came to bedside.  Pt on arrival alert and oriented X4.   Pt transported to CT for scans per provider ok

## 2020-02-12 ENCOUNTER — Ambulatory Visit: Payer: Medicare Other | Admitting: Family Medicine

## 2020-02-12 DIAGNOSIS — Z66 Do not resuscitate: Secondary | ICD-10-CM | POA: Diagnosis not present

## 2020-02-12 DIAGNOSIS — D72829 Elevated white blood cell count, unspecified: Secondary | ICD-10-CM | POA: Diagnosis not present

## 2020-02-12 DIAGNOSIS — R0602 Shortness of breath: Secondary | ICD-10-CM | POA: Diagnosis not present

## 2020-02-12 DIAGNOSIS — N186 End stage renal disease: Secondary | ICD-10-CM | POA: Diagnosis present

## 2020-02-12 DIAGNOSIS — J9622 Acute and chronic respiratory failure with hypercapnia: Secondary | ICD-10-CM | POA: Diagnosis not present

## 2020-02-12 DIAGNOSIS — I132 Hypertensive heart and chronic kidney disease with heart failure and with stage 5 chronic kidney disease, or end stage renal disease: Secondary | ICD-10-CM | POA: Diagnosis present

## 2020-02-12 DIAGNOSIS — T8241XA Breakdown (mechanical) of vascular dialysis catheter, initial encounter: Secondary | ICD-10-CM | POA: Diagnosis not present

## 2020-02-12 DIAGNOSIS — R6521 Severe sepsis with septic shock: Secondary | ICD-10-CM | POA: Diagnosis present

## 2020-02-12 DIAGNOSIS — L0390 Cellulitis, unspecified: Secondary | ICD-10-CM | POA: Diagnosis not present

## 2020-02-12 DIAGNOSIS — J189 Pneumonia, unspecified organism: Secondary | ICD-10-CM | POA: Diagnosis present

## 2020-02-12 DIAGNOSIS — I5032 Chronic diastolic (congestive) heart failure: Secondary | ICD-10-CM | POA: Diagnosis present

## 2020-02-12 DIAGNOSIS — J1282 Pneumonia due to coronavirus disease 2019: Secondary | ICD-10-CM | POA: Diagnosis present

## 2020-02-12 DIAGNOSIS — Y95 Nosocomial condition: Secondary | ICD-10-CM | POA: Diagnosis present

## 2020-02-12 DIAGNOSIS — I2699 Other pulmonary embolism without acute cor pulmonale: Secondary | ICD-10-CM | POA: Diagnosis not present

## 2020-02-12 DIAGNOSIS — I6782 Cerebral ischemia: Secondary | ICD-10-CM | POA: Diagnosis present

## 2020-02-12 DIAGNOSIS — Z992 Dependence on renal dialysis: Secondary | ICD-10-CM | POA: Diagnosis not present

## 2020-02-12 DIAGNOSIS — J9601 Acute respiratory failure with hypoxia: Secondary | ICD-10-CM | POA: Diagnosis present

## 2020-02-12 DIAGNOSIS — I468 Cardiac arrest due to other underlying condition: Secondary | ICD-10-CM | POA: Diagnosis not present

## 2020-02-12 DIAGNOSIS — G9341 Metabolic encephalopathy: Secondary | ICD-10-CM | POA: Diagnosis not present

## 2020-02-12 DIAGNOSIS — I469 Cardiac arrest, cause unspecified: Secondary | ICD-10-CM | POA: Diagnosis not present

## 2020-02-12 DIAGNOSIS — Z9119 Patient's noncompliance with other medical treatment and regimen: Secondary | ICD-10-CM | POA: Diagnosis not present

## 2020-02-12 DIAGNOSIS — T82838A Hemorrhage of vascular prosthetic devices, implants and grafts, initial encounter: Secondary | ICD-10-CM | POA: Diagnosis not present

## 2020-02-12 DIAGNOSIS — A4189 Other specified sepsis: Secondary | ICD-10-CM | POA: Diagnosis present

## 2020-02-12 DIAGNOSIS — E662 Morbid (severe) obesity with alveolar hypoventilation: Secondary | ICD-10-CM | POA: Diagnosis present

## 2020-02-12 DIAGNOSIS — A419 Sepsis, unspecified organism: Secondary | ICD-10-CM | POA: Diagnosis not present

## 2020-02-12 DIAGNOSIS — J810 Acute pulmonary edema: Secondary | ICD-10-CM | POA: Diagnosis not present

## 2020-02-12 DIAGNOSIS — J9621 Acute and chronic respiratory failure with hypoxia: Secondary | ICD-10-CM | POA: Diagnosis present

## 2020-02-12 DIAGNOSIS — N2581 Secondary hyperparathyroidism of renal origin: Secondary | ICD-10-CM | POA: Diagnosis present

## 2020-02-12 DIAGNOSIS — E874 Mixed disorder of acid-base balance: Secondary | ICD-10-CM | POA: Diagnosis present

## 2020-02-12 DIAGNOSIS — U071 COVID-19: Secondary | ICD-10-CM | POA: Diagnosis present

## 2020-02-12 DIAGNOSIS — U099 Post covid-19 condition, unspecified: Secondary | ICD-10-CM | POA: Diagnosis present

## 2020-02-12 DIAGNOSIS — Z9911 Dependence on respirator [ventilator] status: Secondary | ICD-10-CM | POA: Diagnosis not present

## 2020-02-12 DIAGNOSIS — R579 Shock, unspecified: Secondary | ICD-10-CM | POA: Diagnosis not present

## 2020-02-12 DIAGNOSIS — Z515 Encounter for palliative care: Secondary | ICD-10-CM | POA: Diagnosis not present

## 2020-02-12 DIAGNOSIS — Z7189 Other specified counseling: Secondary | ICD-10-CM | POA: Diagnosis not present

## 2020-02-12 DIAGNOSIS — Y712 Prosthetic and other implants, materials and accessory cardiovascular devices associated with adverse incidents: Secondary | ICD-10-CM | POA: Diagnosis not present

## 2020-02-12 DIAGNOSIS — D62 Acute posthemorrhagic anemia: Secondary | ICD-10-CM | POA: Diagnosis not present

## 2020-02-12 DIAGNOSIS — R404 Transient alteration of awareness: Secondary | ICD-10-CM | POA: Diagnosis not present

## 2020-02-12 LAB — RENAL FUNCTION PANEL
Albumin: 2.1 g/dL — ABNORMAL LOW (ref 3.5–5.0)
Albumin: 2.1 g/dL — ABNORMAL LOW (ref 3.5–5.0)
Anion gap: 18 — ABNORMAL HIGH (ref 5–15)
Anion gap: 17 — ABNORMAL HIGH (ref 5–15)
BUN: 43 mg/dL — ABNORMAL HIGH (ref 8–23)
BUN: 44 mg/dL — ABNORMAL HIGH (ref 8–23)
CO2: 24 mmol/L (ref 22–32)
CO2: 26 mmol/L (ref 22–32)
Calcium: 9.8 mg/dL (ref 8.9–10.3)
Calcium: 10.1 mg/dL (ref 8.9–10.3)
Chloride: 92 mmol/L — ABNORMAL LOW (ref 98–111)
Chloride: 89 mmol/L — ABNORMAL LOW (ref 98–111)
Creatinine, Ser: 9.46 mg/dL — ABNORMAL HIGH (ref 0.44–1.00)
Creatinine, Ser: 10.18 mg/dL — ABNORMAL HIGH (ref 0.44–1.00)
GFR, Estimated: 4 mL/min — ABNORMAL LOW (ref >60)
GFR, Estimated: 4 mL/min — ABNORMAL LOW (ref >60)
Glucose, Bld: 68 mg/dL — ABNORMAL LOW (ref 70–99)
Glucose, Bld: 120 mg/dL — ABNORMAL HIGH (ref 70–99)
Phosphorus: 8.2 mg/dL — ABNORMAL HIGH (ref 2.5–4.6)
Phosphorus: 8.3 mg/dL — ABNORMAL HIGH (ref 2.5–4.6)
Potassium: 5 mmol/L (ref 3.5–5.1)
Potassium: 4.6 mmol/L (ref 3.5–5.1)
Sodium: 134 mmol/L — ABNORMAL LOW (ref 135–145)
Sodium: 132 mmol/L — ABNORMAL LOW (ref 135–145)

## 2020-02-12 LAB — GLUCOSE, CAPILLARY
Glucose-Capillary: 201 mg/dL — ABNORMAL HIGH (ref 70–99)
Glucose-Capillary: 133 mg/dL — ABNORMAL HIGH (ref 70–99)
Glucose-Capillary: 152 mg/dL — ABNORMAL HIGH (ref 70–99)

## 2020-02-12 LAB — CBC WITH DIFFERENTIAL/PLATELET
Abs Immature Granulocytes: 1.42 10*3/uL — ABNORMAL HIGH (ref 0.00–0.07)
Basophils Absolute: 0.2 10*3/uL — ABNORMAL HIGH (ref 0.0–0.1)
Basophils Relative: 0 %
Eosinophils Absolute: 0.1 10*3/uL (ref 0.0–0.5)
Eosinophils Relative: 0 %
HCT: 33.2 % — ABNORMAL LOW (ref 36.0–46.0)
Hemoglobin: 10.2 g/dL — ABNORMAL LOW (ref 12.0–15.0)
Immature Granulocytes: 3 %
Lymphocytes Relative: 5 %
Lymphs Abs: 2.3 10*3/uL (ref 0.7–4.0)
MCH: 32 pg (ref 26.0–34.0)
MCHC: 30.7 g/dL (ref 30.0–36.0)
MCV: 104.1 fL — ABNORMAL HIGH (ref 80.0–100.0)
Monocytes Absolute: 3.6 10*3/uL — ABNORMAL HIGH (ref 0.1–1.0)
Monocytes Relative: 9 %
Neutro Abs: 35.2 10*3/uL — ABNORMAL HIGH (ref 1.7–7.7)
Neutrophils Relative %: 83 %
Platelets: 327 10*3/uL (ref 150–400)
RBC: 3.19 MIL/uL — ABNORMAL LOW (ref 3.87–5.11)
RDW: 18.7 % — ABNORMAL HIGH (ref 11.5–15.5)
WBC: 42.8 10*3/uL — ABNORMAL HIGH (ref 4.0–10.5)
nRBC: 0 % (ref 0.0–0.2)

## 2020-02-12 LAB — PATHOLOGIST SMEAR REVIEW

## 2020-02-12 LAB — HEMOGLOBIN A1C
Hgb A1c MFr Bld: 5.5 % (ref 4.8–5.6)
Mean Plasma Glucose: 111.15 mg/dL

## 2020-02-12 LAB — CBG MONITORING, ED: Glucose-Capillary: 83 mg/dL (ref 70–99)

## 2020-02-12 LAB — MAGNESIUM: Magnesium: 1.9 mg/dL (ref 1.7–2.4)

## 2020-02-12 MED ORDER — GABAPENTIN 100 MG PO CAPS: 100.0000 mg | ORAL_CAPSULE | Freq: Every day | ORAL | Status: DC

## 2020-02-12 MED ORDER — INSULIN ASPART 100 UNIT/ML ~~LOC~~ SOLN
0.0000 [IU] | Freq: Three times a day (TID) | SUBCUTANEOUS | Status: DC
  Administered 2020-02-13: 3 [IU] via SUBCUTANEOUS

## 2020-02-12 MED ORDER — DOXEPIN HCL 5 % EX CREA: 1.0000 "application " | TOPICAL_CREAM | Freq: Four times a day (QID) | CUTANEOUS | Status: DC | PRN

## 2020-02-12 MED ORDER — TRAMADOL HCL 50 MG PO TABS: 50.0000 mg | ORAL_TABLET | Freq: Two times a day (BID) | ORAL | Status: DC

## 2020-02-12 MED ORDER — GABAPENTIN 250 MG/5ML PO SOLN
50.0000 mg | Freq: Every day | ORAL | Status: DC
  Administered 2020-02-12: 50 mg via ORAL
  Filled 2020-02-12 (×2): qty 2

## 2020-02-12 MED ORDER — TRAMADOL HCL 50 MG PO TABS
25.0000 mg | ORAL_TABLET | Freq: Two times a day (BID) | ORAL | Status: DC
  Administered 2020-02-12 – 2020-02-13 (×2): 25 mg via ORAL
  Filled 2020-02-12 (×2): qty 1

## 2020-02-12 NOTE — ED Notes (Signed)
Pt c/o of her sheets being wet when I arrived to my shift. Pt was changed and bed is locked and lowest position. Pt was given warm blanket. Pt is a&o x4 at this time. VSS.

## 2020-02-12 NOTE — Progress Notes (Signed)
Patient was transferred from [facility] to [phone]. A&O x4.  She is on 4L O2 . Complaining of pain 8/10 in bilateral legs. Vital signs are stable. Rash on bilateral legs and cracking on bilateral heels. All toes on R foot have been amputated. She has a bracelet and ring on, and has clothing, a purse/wallet, and a cellphone at bedside. Patient states she did not bring her dentures with her.  Patient was educated regarding fall risk precautions. She states that she uses a front wheel walker at home, one has been placed at bedside. Phone and call bell within reach.  Voicemail has been left for husband regarding his wife's transfer to the unit.

## 2020-02-12 NOTE — Progress Notes (Signed)
FPTS Interim Progress Note  S:Went to assess patient.  She was sleeping but easy to arouse to speech.  Answers questions appropriately and oriented x4.  She continues to complain of  pain in legs but no worse than when at home.    O: BP (!) 102/51 (BP Location: Left Wrist)    Pulse 77    Temp 98.5 F (36.9 C) (Oral)    Resp 20    Ht 5\' 4"  (1.626 m)    Wt 81.6 kg    LMP 09/18/2011    SpO2 92%    BMI 30.90 kg/m    Physical Exam:  General: 60 y.o. female in NAD Cardio: RRR no m/r/g Lungs: CTAB, no wheezing, no rhonchi, no crackles, no IWOB on home   A/P: Acute on Chronic Hypoxic Resp Failure Stable.  On 4 L oxygen Continue to monitor Wean oxygen to baseline of 3L.     Carollee Leitz, MD 02/12/2020, 3:37 PM PGY-2, [place] [facility] Service pager [phone]

## 2020-02-12 NOTE — Progress Notes (Deleted)
     SUBJECTIVE:   CHIEF COMPLAINT / HPI:   R[place] is a 60 y.o. female presents for leg pain  Leg pain   ***  PERTINENT  PMH / PSH: GERD, OSA, Afib, DM  OBJECTIVE:   LMP 09/18/2011    General: Alert, no acute distress Cardio: Normal S1 and S2, RRR, no r/m/g Pulm: CTAB, normal work of breathing Abdomen: Bowel sounds normal. Abdomen soft and non-tender.  Extremities: No peripheral edema.  Neuro: Cranial nerves grossly intact   ASSESSMENT/PLAN:   No problem-specific Assessment & Plan notes found for this encounter.     Lattie Haw, MD PGY-2 [place]

## 2020-02-12 NOTE — Progress Notes (Addendum)
Family Medicine Teaching Service Daily Progress Note Intern Pager: [phone]  Patient name: Rachel Chaney Medical record number: [id number] Date of birth: Sep 25, 1960 Age: 60 y.o. Gender: female  Primary Care Provider: Benay Pike, MD Consultants: None Code Status: Full  Pt Overview and Major Events to Date:  Acute on chronic respiratory failure requiring increased oxygen demand  Assessment and Plan: Rachel Chaney is a 60 y.o. female presenting with "skin pain". PMH is significant for ESRD on HD MWF, chronic hypoxic respiratory failure, T2DM, HFpEF, HTN, h/o MI (2020), HLD.   Acute on Chronic Hypoxic Respiratory Failure  Recent COVID-19 (01/31/20) Stable. Patient uses 3L O2 at home but did not bring it with her to ED and was found to be 64% on room air. She tested positive for COVID-19 on 01/31/2020 and received remdesevir x2 during prior admission. CXR 2/2 shows patchy bilateral infiltrates c/w multifocal PNA likely due to recent COVID infection.  ABG in the ED and showed pH 7.345, pO2 64, pCO2 59, bicarb 32.2. Etiology of her hypoxemia likely multifactorial secondary to noncompliance with home O2 and COVID sequela infection.  Patient currently stable on 4L [place] with no respiratory distress. -Continuous pulse ox, goal SpO2 >92%, vital signs per unit routine -Supplemental O2 as needed, wean to home 3L as tolerated -Airborne/contact precautions (until 21 days from initial positive test, per hospital protocol) -Incentive spirometry -CPAP qhs -Close monitoring  Calciphylaxis  Lichen Simplex Chronicus  Uncontrolled Pain  Presented to ED with skin pain. Patient with several months of itchy, painful skin lesions on bilateral legs and abdomen. She has been seen multiple times by PCP and in the ED for her symptoms. Biopsy of papular lesions on 01/01/2020 was consistent with lichen simplex chronicus. Multiple providers have also felt there is an additional component of calciphylaxis. No  signs of cellulitis or abscess on exam/imaging. Pt given 0.5 mg IV Dilaudid in the ED and became hypotensive (87/40, see below). She has trialed Triamcinolone and Doxepin cream as well as hydroxyzine for itching and Tramadol 25mg  q12h prn for pain.  -Limit prn pain medication -Continue Doxepin cream -Gabapentin 50mg  qhs, titrate for HD dosing  Hypotension, resolved Pt given 0.5 mg IV Diluadid in the ED for pain. Shortly after returning from CT her BP dropped 80s/40s and became acutely confused. She lost her IV and ED provider placed a left EJ, which she has since pulled out. She received a 500cc bolus. BP now stable. -Monitor BP   -Avoid Dilaudid at this time  Leukocytosis WBC 34.6 on admission. She does have a left shift, with increased monocytes and immature granulocytes. Patient with a hx of persistent leukocytosis, last seen by hematology in 2016. She did receive vancomycin in the ED. Given patient's history, this is less likely infectious-- patient is afebrile, lactic acid 1.6, skin wounds not consistent with cellulitis (no warmth or surrounding erythema, no abscess), no source identified on CT abdomen/pelvis and no bacterial PNA on CXR. Discontinue vancomycin. Will monitor for fever and follow up on blood cultures drawn in the ED.  -Daily CBC -Follow-up blood cx results: no growth at 1 day -Hold antibiotics at this time -Outpatient heme-onc f/u  ESRD on HD MWF  Anemia of Chronic Disease Chronic, stable. On admission: Cr 8.76, anion gap 16, chloride 90, electrolytes otherwise wnl. Hgb 11.3, which is her baseline. Last HD 2/1.  -HD Friday per PTA schedule -Daily renal function panel/CBC -Renal dosing of meds as appropriate  T2DM Glucose 110 on arrival. Was  previously on insulin, but this was discontinued during her most recent admission (02/01/2020) due to hypoglycemia. Reports CBG 45 at home 2 days ago. A1c on admission 5.5% down from 9.0% on 11/12/2019. -Hypoglycemia  percautions -ssSI and CBG monitoring qAC and qHS -Outpatient PCP f/u  HLD Chronic, stable. Most recent lipid panel from 11/12/2019: total cholesterol 79, HDL 31, LDL 35, triglycerides 65. -Continue home atorvastatin 80mg  daily   FEN/GI: Renal diet with 1200cc fluid restriction  Prophylaxis: Heparin 5ku   Disposition: Observation status, FPTS, attending Dr. Nori Riis  Subjective:  See upper level attestation  Objective: Temp:  [98.4 F (36.9 C)-98.5 F (36.9 C)] 98.5 F (36.9 C) (02/02 1518) Pulse Rate:  [55-102] 56 (02/03 0730) Resp:  [13-24] 15 (02/03 0730) BP: (87-135)/(33-98) 119/47 (02/03 0730) SpO2:  [93 %-100 %] 100 % (02/03 0730) Weight:  [81.6 kg] 81.6 kg (02/02 1724) Physical Exam: See upper level attestation  Laboratory: Recent Labs  Lab 02/05/20 1051 02/05/20 1147 03/06/2020 1053 02/21/2020 2045 02/12/20 0430  WBC 13.3*  --  34.6*  --  42.8*  HGB 10.8*   < > 11.3* 11.9* 10.2*  HCT 34.0*   < > 38.1 35.0* 33.2*  PLT 200  --  331  --  327   < > = values in this interval not displayed.   Recent Labs  Lab 02/05/20 1051 02/05/20 1147 03/03/2020 1053 02/13/2020 2045 02/12/20 0430  NA 134*   < > 134* 133* 134*  K 4.5   < > 3.8 4.1 5.0  CL 92*  --  90*  --  92*  CO2 24  --  28  --  24  BUN 35*  --  37*  --  43*  CREATININE 9.23*  --  8.76*  --  9.46*  CALCIUM 8.0*  --  10.0  --  9.8  GLUCOSE 55*  --  110*  --  68*   < > = values in this interval not displayed.     Imaging/Diagnostic Tests: DG Chest 2 View 02/20/2020 IMPRESSION:  Patchy BILATERAL pulmonary infiltrates favor multifocal pneumonia over pulmonary edema.  CT ABDOMEN PELVIS W CONTRAST 02/27/2020 IMPRESSION:  1. No acute abnormality.  2. Multiple chronic findings as detailed above, not substantially changed from recent prior study.  CT FEMUR LEFT W CONTRAST 02/10/2020 IMPRESSION:  1. No acute osseous abnormality.  2. There is nonspecific lower extremity edema without evidence for an abscess.  Findings are nonspecific but can be seen in patients with cellulitis in the appropriate clinical setting. Other differential considerations include chronic venous insufficiency given the presence of subcutaneous calcifications.  3. No radiographic evidence for osteomyelitis.  4. Aortic Atherosclerosis   CT FEMUR RIGHT W CONTRAST 02/25/2020 IMPRESSION:  1. No acute osseous abnormality. 2. There is nonspecific lower extremity edema without evidence for an abscess. Findings are nonspecific but can be seen in patients with cellulitis in the appropriate clinical setting. Other differential considerations include chronic venous insufficiency given the presence of subcutaneous calcifications.  3. No radiographic evidence for osteomyelitis.  4.  Aortic Atherosclerosis   Karle Plumber, Medical Student 02/12/2020, 7:49 AM Acting Intern, [place] Intern pager: [phone], text pages welcome   I have personally seen and examined this patient and agree with the above note. The following is my additional documentation.   S:  Patient resting comfortably on stretcher in ED.  Remains on 4L oxygen. Reports no worsening SOB. Complains of lower extremity pain ongoing for months. She would  like for [place] to "fix" her legs.  No other concerns voiced  Physical Exam: General: Alert and oriented, no apparent distress.  VSS  Eyes: PEERLA ENTM: No pharyengeal erythema Neck: non tender, no JVD appreciated Cardiovascular: RRR with no murmurs noted Respiratory: CTA bilaterally, no IWOB, no wheezing or crackles heard, pulses present  Gastrointestinal: Bowel sounds present. No abdominal pain  Extremities: upper thighs indurated from chronic calciphylaxis, no lower extremity edema Psych: Behavior and speech appropriate to situation  Carollee Leitz MD. 02/12/2020, 12:54 PM PGY-2, [place]

## 2020-02-12 NOTE — ED Notes (Signed)
Attempted to call report

## 2020-02-12 NOTE — ED Notes (Signed)
Breakfast ordered 

## 2020-02-13 ENCOUNTER — Inpatient Hospital Stay (HOSPITAL_COMMUNITY): Payer: Medicare Other

## 2020-02-13 ENCOUNTER — Encounter (HOSPITAL_COMMUNITY): Payer: Self-pay | Admitting: Family Medicine

## 2020-02-13 ENCOUNTER — Inpatient Hospital Stay (HOSPITAL_COMMUNITY): Payer: Medicare Other | Admitting: Certified Registered Nurse Anesthetist

## 2020-02-13 DIAGNOSIS — J9601 Acute respiratory failure with hypoxia: Secondary | ICD-10-CM

## 2020-02-13 LAB — COMPREHENSIVE METABOLIC PANEL
ALT: 18 U/L (ref 0–44)
AST: 27 U/L (ref 15–41)
Albumin: 2.1 g/dL — ABNORMAL LOW (ref 3.5–5.0)
Alkaline Phosphatase: 179 U/L — ABNORMAL HIGH (ref 38–126)
Anion gap: 19 — ABNORMAL HIGH (ref 5–15)
BUN: 39 mg/dL — ABNORMAL HIGH (ref 8–23)
CO2: 24 mmol/L (ref 22–32)
Calcium: 9.4 mg/dL (ref 8.9–10.3)
Chloride: 92 mmol/L — ABNORMAL LOW (ref 98–111)
Creatinine, Ser: 9.25 mg/dL — ABNORMAL HIGH (ref 0.44–1.00)
GFR, Estimated: 4 mL/min — ABNORMAL LOW (ref >60)
Glucose, Bld: 174 mg/dL — ABNORMAL HIGH (ref 70–99)
Potassium: 4 mmol/L (ref 3.5–5.1)
Sodium: 135 mmol/L (ref 135–145)
Total Bilirubin: 0.8 mg/dL (ref 0.3–1.2)
Total Protein: 6.3 g/dL — ABNORMAL LOW (ref 6.5–8.1)

## 2020-02-13 LAB — CBC WITH DIFFERENTIAL/PLATELET
Abs Immature Granulocytes: 1.2 10*3/uL — ABNORMAL HIGH (ref 0.00–0.07)
Basophils Absolute: 0 10*3/uL (ref 0.0–0.1)
Basophils Relative: 0 %
Eosinophils Absolute: 0 10*3/uL (ref 0.0–0.5)
Eosinophils Relative: 0 %
HCT: 35.3 % — ABNORMAL LOW (ref 36.0–46.0)
Hemoglobin: 10.6 g/dL — ABNORMAL LOW (ref 12.0–15.0)
Lymphocytes Relative: 6 %
Lymphs Abs: 3.6 10*3/uL (ref 0.7–4.0)
MCH: 32.5 pg (ref 26.0–34.0)
MCHC: 30 g/dL (ref 30.0–36.0)
MCV: 108.3 fL — ABNORMAL HIGH (ref 80.0–100.0)
Monocytes Absolute: 2.4 10*3/uL — ABNORMAL HIGH (ref 0.1–1.0)
Monocytes Relative: 4 %
Myelocytes: 1 %
Neutro Abs: 53.2 10*3/uL — ABNORMAL HIGH (ref 1.7–7.7)
Neutrophils Relative %: 88 %
Platelets: 374 10*3/uL (ref 150–400)
Promyelocytes Relative: 1 %
RBC: 3.26 MIL/uL — ABNORMAL LOW (ref 3.87–5.11)
RDW: 18.9 % — ABNORMAL HIGH (ref 11.5–15.5)
WBC: 60.5 10*3/uL (ref 4.0–10.5)
nRBC: 0.2 % (ref 0.0–0.2)
nRBC: 1 /100 WBC — ABNORMAL HIGH

## 2020-02-13 LAB — RENAL FUNCTION PANEL
Albumin: 2 g/dL — ABNORMAL LOW (ref 3.5–5.0)
Anion gap: 17 — ABNORMAL HIGH (ref 5–15)
BUN: 48 mg/dL — ABNORMAL HIGH (ref 8–23)
CO2: 24 mmol/L (ref 22–32)
Calcium: 9.5 mg/dL (ref 8.9–10.3)
Chloride: 90 mmol/L — ABNORMAL LOW (ref 98–111)
Creatinine, Ser: 10.71 mg/dL — ABNORMAL HIGH (ref 0.44–1.00)
GFR, Estimated: 4 mL/min — ABNORMAL LOW (ref >60)
Glucose, Bld: 162 mg/dL — ABNORMAL HIGH (ref 70–99)
Phosphorus: 7.6 mg/dL — ABNORMAL HIGH (ref 2.5–4.6)
Potassium: 4.9 mmol/L (ref 3.5–5.1)
Sodium: 131 mmol/L — ABNORMAL LOW (ref 135–145)

## 2020-02-13 LAB — POCT I-STAT 7, (LYTES, BLD GAS, ICA,H+H)
Acid-Base Excess: 1 mmol/L (ref 0.0–2.0)
Bicarbonate: 25.7 mmol/L (ref 20.0–28.0)
Calcium, Ion: 1.04 mmol/L — ABNORMAL LOW (ref 1.15–1.40)
HCT: 31 % — ABNORMAL LOW (ref 36.0–46.0)
Hemoglobin: 10.5 g/dL — ABNORMAL LOW (ref 12.0–15.0)
O2 Saturation: 100 %
Patient temperature: 98
Potassium: 3.9 mmol/L (ref 3.5–5.1)
Sodium: 130 mmol/L — ABNORMAL LOW (ref 135–145)
TCO2: 27 mmol/L (ref 22–32)
pCO2 arterial: 39.9 mmHg (ref 32.0–48.0)
pH, Arterial: 7.415 (ref 7.350–7.450)
pO2, Arterial: 173 mmHg — ABNORMAL HIGH (ref 83.0–108.0)

## 2020-02-13 LAB — CBC
HCT: 32.6 % — ABNORMAL LOW (ref 36.0–46.0)
Hemoglobin: 10 g/dL — ABNORMAL LOW (ref 12.0–15.0)
MCH: 32.1 pg (ref 26.0–34.0)
MCHC: 30.7 g/dL (ref 30.0–36.0)
MCV: 104.5 fL — ABNORMAL HIGH (ref 80.0–100.0)
Platelets: 294 10*3/uL (ref 150–400)
RBC: 3.12 MIL/uL — ABNORMAL LOW (ref 3.87–5.11)
RDW: 18.8 % — ABNORMAL HIGH (ref 11.5–15.5)
WBC: 37.8 10*3/uL — ABNORMAL HIGH (ref 4.0–10.5)
nRBC: 0.1 % (ref 0.0–0.2)

## 2020-02-13 LAB — BLOOD GAS, ARTERIAL
Acid-Base Excess: 0.7 mmol/L (ref 0.0–2.0)
Bicarbonate: 26.9 mmol/L (ref 20.0–28.0)
Drawn by: 406621
FIO2: 32
O2 Saturation: 91.8 %
Patient temperature: 36.7
pCO2 arterial: 59.5 mmHg — ABNORMAL HIGH (ref 32.0–48.0)
pH, Arterial: 7.275 — ABNORMAL LOW (ref 7.350–7.450)
pO2, Arterial: 71.3 mmHg — ABNORMAL LOW (ref 83.0–108.0)

## 2020-02-13 LAB — GLUCOSE, CAPILLARY
Glucose-Capillary: 148 mg/dL — ABNORMAL HIGH (ref 70–99)
Glucose-Capillary: 129 mg/dL — ABNORMAL HIGH (ref 70–99)
Glucose-Capillary: 238 mg/dL — ABNORMAL HIGH (ref 70–99)
Glucose-Capillary: 216 mg/dL — ABNORMAL HIGH (ref 70–99)
Glucose-Capillary: 165 mg/dL — ABNORMAL HIGH (ref 70–99)

## 2020-02-13 LAB — MAGNESIUM: Magnesium: 1.9 mg/dL (ref 1.7–2.4)

## 2020-02-13 LAB — TROPONIN I (HIGH SENSITIVITY): Troponin I (High Sensitivity): 274 ng/L (ref <18)

## 2020-02-13 LAB — POCT ACTIVATED CLOTTING TIME
Activated Clotting Time: 220 seconds
Activated Clotting Time: 166 seconds

## 2020-02-13 LAB — LACTIC ACID, PLASMA
Lactic Acid, Venous: 5.8 mmol/L (ref 0.5–1.9)
Lactic Acid, Venous: 2.1 mmol/L (ref 0.5–1.9)

## 2020-02-13 LAB — PROCALCITONIN: Procalcitonin: 92.99 ng/mL

## 2020-02-13 MED ORDER — NOREPINEPHRINE 4 MG/250ML-% IV SOLN
2.0000 ug/min | INTRAVENOUS | Status: DC
  Administered 2020-02-13: 10 ug/min via INTRAVENOUS
  Administered 2020-02-13 (×2): 5 ug/min via INTRAVENOUS
  Administered 2020-02-17: 14:00:00 2 ug/min via INTRAVENOUS
  Administered 2020-02-18: 7 ug/min via INTRAVENOUS
  Administered 2020-02-19: 3 ug/min via INTRAVENOUS
  Administered 2020-02-20: 6 ug/min via INTRAVENOUS
  Administered 2020-02-21: 10 ug/min via INTRAVENOUS
  Administered 2020-02-21: 9 ug/min via INTRAVENOUS
  Filled 2020-02-13 (×10): qty 250

## 2020-02-13 MED ORDER — MIDAZOLAM HCL 2 MG/2ML IJ SOLN
2.0000 mg | INTRAMUSCULAR | Status: DC | PRN
  Administered 2020-02-14: 2 mg via INTRAVENOUS
  Filled 2020-02-13: qty 2

## 2020-02-13 MED ORDER — PRISMASOL BGK 4/2.5 32-4-2.5 MEQ/L REPLACEMENT SOLN
Status: DC
  Filled 2020-02-13 (×5): qty 5000

## 2020-02-13 MED ORDER — VANCOMYCIN HCL 1750 MG/350ML IV SOLN
1750.0000 mg | Freq: Once | INTRAVENOUS | Status: DC
  Filled 2020-02-13: qty 350

## 2020-02-13 MED ORDER — MIDAZOLAM HCL 2 MG/2ML IJ SOLN
INTRAMUSCULAR | Status: AC
  Administered 2020-02-13: 2 mg via INTRAVENOUS
  Filled 2020-02-13: qty 2

## 2020-02-13 MED ORDER — HEPARIN SODIUM (PORCINE) 1000 UNIT/ML IJ SOLN
INTRAMUSCULAR | Status: AC
  Filled 2020-02-13: qty 8

## 2020-02-13 MED ORDER — FENTANYL CITRATE (PF) 100 MCG/2ML IJ SOLN
50.0000 ug | INTRAMUSCULAR | Status: DC | PRN
Start: 2020-02-13 — End: 2020-02-16
  Administered 2020-02-14: 100 ug via INTRAVENOUS
  Filled 2020-02-13 (×2): qty 2

## 2020-02-13 MED ORDER — SILVER SULFADIAZINE 1 % EX CREA
TOPICAL_CREAM | Freq: Two times a day (BID) | CUTANEOUS | Status: DC
  Administered 2020-02-13 – 2020-02-29 (×9): 1 via TOPICAL
  Filled 2020-02-13: qty 85

## 2020-02-13 MED ORDER — ATORVASTATIN CALCIUM 80 MG PO TABS
80.0000 mg | ORAL_TABLET | Freq: Every day | ORAL | Status: DC
  Administered 2020-02-13 – 2020-02-19 (×7): 80 mg
  Filled 2020-02-13 (×7): qty 1

## 2020-02-13 MED ORDER — PROPOFOL 1000 MG/100ML IV EMUL
10.0000 ug/kg/min | INTRAVENOUS | Status: DC
  Administered 2020-02-14 (×2): 20 ug/kg/min via INTRAVENOUS
  Administered 2020-02-14: 30 ug/kg/min via INTRAVENOUS
  Administered 2020-02-14: 20 ug/kg/min via INTRAVENOUS
  Administered 2020-02-15 (×2): 30 ug/kg/min via INTRAVENOUS
  Administered 2020-02-16: 25 ug/kg/min via INTRAVENOUS
  Administered 2020-02-16: 20 ug/kg/min via INTRAVENOUS
  Administered 2020-02-17 (×2): 15 ug/kg/min via INTRAVENOUS
  Administered 2020-02-18: 10 ug/kg/min via INTRAVENOUS
  Filled 2020-02-13 (×12): qty 100

## 2020-02-13 MED ORDER — LACTATED RINGERS IV BOLUS
500.0000 mL | Freq: Once | INTRAVENOUS | Status: AC
  Administered 2020-02-13: 500 mL via INTRAVENOUS

## 2020-02-13 MED ORDER — GABAPENTIN 250 MG/5ML PO SOLN
50.0000 mg | Freq: Every day | ORAL | Status: DC
  Administered 2020-02-13 – 2020-02-19 (×7): 50 mg
  Filled 2020-02-13 (×9): qty 2

## 2020-02-13 MED ORDER — ALBUTEROL SULFATE (2.5 MG/3ML) 0.083% IN NEBU: 2.5000 mg | INHALATION_SOLUTION | RESPIRATORY_TRACT | Status: DC | PRN

## 2020-02-13 MED ORDER — SODIUM CHLORIDE 0.9 % IV SOLN
250.0000 mL | INTRAVENOUS | Status: DC
  Administered 2020-02-13 – 2020-02-16 (×2): 250 mL via INTRAVENOUS

## 2020-02-13 MED ORDER — MIDAZOLAM HCL 2 MG/2ML IJ SOLN
2.0000 mg | INTRAMUSCULAR | Status: DC | PRN
  Administered 2020-02-13: 2 mg via INTRAVENOUS
  Filled 2020-02-13 (×2): qty 2

## 2020-02-13 MED ORDER — FAMOTIDINE IN NACL 20-0.9 MG/50ML-% IV SOLN
20.0000 mg | Freq: Every day | INTRAVENOUS | Status: DC
  Administered 2020-02-13 – 2020-02-22 (×10): 20 mg via INTRAVENOUS
  Filled 2020-02-13 (×10): qty 50

## 2020-02-13 MED ORDER — HEPARIN BOLUS VIA INFUSION (CRRT)
1000.0000 [IU] | INTRAVENOUS | Status: DC | PRN
  Filled 2020-02-13: qty 1000

## 2020-02-13 MED ORDER — DOCUSATE SODIUM 50 MG/5ML PO LIQD
100.0000 mg | Freq: Two times a day (BID) | ORAL | Status: DC
  Administered 2020-02-13 – 2020-02-20 (×12): 100 mg
  Filled 2020-02-13 (×13): qty 10

## 2020-02-13 MED ORDER — HEPARIN SODIUM (PORCINE) 1000 UNIT/ML DIALYSIS
1000.0000 [IU] | INTRAMUSCULAR | Status: DC | PRN
  Administered 2020-02-13: 3000 [IU] via INTRAVENOUS_CENTRAL
  Filled 2020-02-13: qty 6
  Filled 2020-02-13: qty 4
  Filled 2020-02-13: qty 6

## 2020-02-13 MED ORDER — MIDODRINE HCL 5 MG PO TABS
2.5000 mg | ORAL_TABLET | Freq: Three times a day (TID) | ORAL | Status: DC
  Administered 2020-02-13: 2.5 mg via ORAL
  Filled 2020-02-13: qty 1

## 2020-02-13 MED ORDER — PIPERACILLIN-TAZOBACTAM IN DEX 2-0.25 GM/50ML IV SOLN
2.2500 g | Freq: Three times a day (TID) | INTRAVENOUS | Status: DC
  Administered 2020-02-13: 2.25 g via INTRAVENOUS
  Filled 2020-02-13 (×3): qty 50

## 2020-02-13 MED ORDER — ASPIRIN 81 MG PO CHEW
81.0000 mg | CHEWABLE_TABLET | Freq: Every day | ORAL | Status: DC
  Administered 2020-02-14 – 2020-02-20 (×7): 81 mg
  Filled 2020-02-13 (×8): qty 1

## 2020-02-13 MED ORDER — POLYETHYLENE GLYCOL 3350 17 G PO PACK
17.0000 g | PACK | Freq: Every day | ORAL | Status: DC
  Administered 2020-02-14 – 2020-02-20 (×5): 17 g
  Filled 2020-02-13 (×6): qty 1

## 2020-02-13 MED ORDER — CHLORHEXIDINE GLUCONATE 0.12% ORAL RINSE (MEDLINE KIT)
15.0000 mL | Freq: Two times a day (BID) | OROMUCOSAL | Status: DC
  Administered 2020-02-13 – 2020-02-20 (×14): 15 mL via OROMUCOSAL

## 2020-02-13 MED ORDER — TRAMADOL HCL 50 MG PO TABS
25.0000 mg | ORAL_TABLET | Freq: Two times a day (BID) | ORAL | Status: DC
  Administered 2020-02-13 – 2020-02-15 (×4): 25 mg
  Filled 2020-02-13 (×4): qty 1

## 2020-02-13 MED ORDER — SODIUM CHLORIDE 0.9 % IV BOLUS
500.0000 mL | Freq: Once | INTRAVENOUS | Status: AC
  Administered 2020-02-13: 500 mL via INTRAVENOUS

## 2020-02-13 MED ORDER — PIPERACILLIN-TAZOBACTAM 3.375 G IVPB
3.3750 g | Freq: Four times a day (QID) | INTRAVENOUS | Status: DC
  Administered 2020-02-13 – 2020-02-16 (×10): 3.375 g via INTRAVENOUS
  Filled 2020-02-13 (×9): qty 50

## 2020-02-13 MED ORDER — AMIODARONE HCL IN DEXTROSE 360-4.14 MG/200ML-% IV SOLN
60.0000 mg/h | INTRAVENOUS | Status: AC
  Filled 2020-02-13: qty 200

## 2020-02-13 MED ORDER — PIPERACILLIN-TAZOBACTAM IN DEX 2-0.25 GM/50ML IV SOLN
2.2500 g | Freq: Four times a day (QID) | INTRAVENOUS | Status: DC
  Filled 2020-02-13 (×2): qty 50

## 2020-02-13 MED ORDER — PROPOFOL 1000 MG/100ML IV EMUL
INTRAVENOUS | Status: AC
  Administered 2020-02-13: 10 ug/kg/min via INTRAVENOUS
  Filled 2020-02-13: qty 100

## 2020-02-13 MED ORDER — MIDODRINE HCL 5 MG PO TABS: 5.0000 mg | ORAL_TABLET | Freq: Three times a day (TID) | ORAL | Status: DC

## 2020-02-13 MED ORDER — HEPARIN (PORCINE) 2000 UNITS/L FOR CRRT: INTRAVENOUS_CENTRAL | Status: DC | PRN

## 2020-02-13 MED ORDER — MIDODRINE HCL 5 MG PO TABS: 2.5000 mg | ORAL_TABLET | Freq: Three times a day (TID) | ORAL | Status: DC

## 2020-02-13 MED ORDER — FENTANYL CITRATE (PF) 100 MCG/2ML IJ SOLN
50.0000 ug | INTRAMUSCULAR | Status: AC | PRN
Start: 2020-02-13 — End: 2020-02-13
  Administered 2020-02-13 (×3): 50 ug via INTRAVENOUS
  Filled 2020-02-13: qty 2

## 2020-02-13 MED ORDER — PRISMASOL BGK 4/2.5 32-4-2.5 MEQ/L EC SOLN
Status: DC
  Filled 2020-02-13 (×15): qty 5000

## 2020-02-13 MED ORDER — ORAL CARE MOUTH RINSE
15.0000 mL | OROMUCOSAL | Status: DC
  Administered 2020-02-13 – 2020-02-20 (×70): 15 mL via OROMUCOSAL

## 2020-02-13 MED ORDER — DEXMEDETOMIDINE HCL IN NACL 400 MCG/100ML IV SOLN
0.0000 ug/kg/h | INTRAVENOUS | Status: DC
  Administered 2020-02-13: 0.4 ug/kg/h via INTRAVENOUS
  Filled 2020-02-13: qty 100

## 2020-02-13 MED ORDER — AMIODARONE HCL IN DEXTROSE 360-4.14 MG/200ML-% IV SOLN
30.0000 mg/h | INTRAVENOUS | Status: DC
  Filled 2020-02-13: qty 200

## 2020-02-13 MED ORDER — VANCOMYCIN HCL 750 MG/150ML IV SOLN
750.0000 mg | INTRAVENOUS | Status: DC
  Filled 2020-02-13: qty 150

## 2020-02-13 MED ORDER — CHLORHEXIDINE GLUCONATE CLOTH 2 % EX PADS
6.0000 | MEDICATED_PAD | Freq: Every day | CUTANEOUS | Status: DC
  Administered 2020-02-13 – 2020-02-22 (×11): 6 via TOPICAL

## 2020-02-13 MED ORDER — SODIUM CHLORIDE 0.9 % IV SOLN
250.0000 [IU]/h | INTRAVENOUS | Status: DC
  Administered 2020-02-13: 250 [IU]/h via INTRAVENOUS_CENTRAL
  Administered 2020-02-14: 1000 [IU]/h via INTRAVENOUS_CENTRAL
  Administered 2020-02-14: 950 [IU]/h via INTRAVENOUS_CENTRAL
  Administered 2020-02-15: 1000 [IU]/h via INTRAVENOUS_CENTRAL
  Administered 2020-02-15: 1050 [IU]/h via INTRAVENOUS_CENTRAL
  Filled 2020-02-13 (×7): qty 2

## 2020-02-13 MED ORDER — MIDODRINE HCL 5 MG PO TABS
2.5000 mg | ORAL_TABLET | Freq: Three times a day (TID) | ORAL | Status: DC
  Administered 2020-02-14 – 2020-02-15 (×6): 2.5 mg
  Filled 2020-02-13 (×6): qty 1

## 2020-02-13 MED ORDER — VANCOMYCIN HCL IN DEXTROSE 1-5 GM/200ML-% IV SOLN: 1000.0000 mg | INTRAVENOUS | Status: DC

## 2020-02-13 MED ORDER — INSULIN ASPART 100 UNIT/ML ~~LOC~~ SOLN
0.0000 [IU] | SUBCUTANEOUS | Status: DC
  Administered 2020-02-13 – 2020-02-16 (×12): 1 [IU] via SUBCUTANEOUS
  Administered 2020-02-17 (×3): 2 [IU] via SUBCUTANEOUS

## 2020-02-13 NOTE — Consult Note (Addendum)
[place] Nurse Consult Note: Patient receiving care in [phone] Patient with history of Calciphylaxis Lichen Simplex Chronicus Uncontrolled Paincurrently being managed by [facility]. Reason for Consult: right hip wound Wound type: Unstageable right hip wound Pressure Injury POA: Yes Wound bed: 100% black eschar Drainage (amount, consistency, odor) None Periwound: Intact Dressing procedure/placement/frequency: Clean the right hip with NS. Gently scrub to remove loose slough and black eschar. Apply silvadene on the wound area only then cover with telfa non-stick dressing and secure with medipore tape. Change BID. Continue Doxepin topical ordered by Lyndee Hensen, DO four times a day for pain and itching in all other areas.   Monitor the wound area(s) for worsening of condition such as: Signs/symptoms of infection, increase in size, development of or worsening of odor, development of pain, or increased pain at the affected locations.   Notify the medical team if any of these develop.  Thank you for the consult. [place] nurse will not follow at this time.   Please re-consult the [place] team if needed.  Cathlean Marseilles Tamala Julian, MSN, RN, [place], Lysle Pearl, [facility]ound [facility] Associate Pager [phone]

## 2020-02-13 NOTE — Progress Notes (Signed)
Rt note-Code blue in hemodialysis, CPR in progress upon arrival. Attempted to intubate x 2, no visualization, continued BMV. Anesthesia now at bedside with glidescope. ETT placed 7.5 and secured at 22cm. Placed on ventilator, continue to monitor with pending transport to ICU.

## 2020-02-13 NOTE — Progress Notes (Signed)
Pt arrived the unit around 1300; Pt lethargic and confused; pt yelling "hello hello "and when asked what she needed she is not verbalizing but continues to yell. It looks like she is calling someone. 1305 first BP=87/42UF 1312 HD initiated BP=89/35 goal reduced to 1521ml;  @ 1330 Pt given a 100ccNS  bolus for hypotension and UF turned off. Around 1350 Pt noted pulseless; CPR initiated; Code blue initiated and bolus of 200cc NS given. Code blue arrived CPR continued; pt intubated on the unit and transferred to ICU.

## 2020-02-13 NOTE — Consult Note (Signed)
[facility] Renal Consultation Note    Indication for Consultation:  Management of ESRD/hemodialysis; anemia, hypertension/volume and secondary hyperparathyroidism  HPI: Rachel Chaney is a 60 y.o. female with a PMH significant for DM type 2, HTN, CAD, CHF, ESRD on HD MWF, and recent covid-19 infection on 01/31/20 and treated with remdesivir x 2 and discharged to home on 02/01/20 without need for oxygen.  She presented back to [facility] ED on 02/29/2020 with increased pain of her lower extremities and abdomen.  In the ED she was noted to be hypoxic to 76% and CXR revealed bilateral, patchy infiltrates c/w multifocal pneumonia presumably due to recent COVID infection.  She was admitted for further treatment of her covid-19 pna and we were consulted to provide dialysis during her hospitalization.   Past Medical History:  Diagnosis Date  . Abnormal appearance of cervix 08/08/2011  . Acute respiratory failure with hypoxia ([place])   . Blurred vision 01/28/2020  . Cardiac arrest ([place]) 02/28/2018   while on hemodialysis in hospital  . CHF (congestive heart failure) ([place])   . Chronic kidney disease due to diabetes mellitus ([place]) 10/13/2013  . Community acquired bilateral lower lobe pneumonia 11/11/2019  . COVID-19 01/31/2020  . Diabetes mellitus    Type II  . ESRD (end stage renal disease) ([place])    MWF - [facility]  . Excess fluid volume 12/16/2019  . Hyperlipidemia   . Hypertension   . Neuropathy    Past Surgical History:  Procedure Laterality Date  . AMPUTATION Right 02/27/2018   Procedure: RIGHT FOOT 2ND AND 3RD RAY AMPUTATION;  Surgeon: [name], MD;  Location: [place];  Service: Orthopedics;  Laterality: Right;  . AMPUTATION Right 04/24/2018   Procedure: RIGHT TRANSMETATARSAL AMPUTATION, PLACE VAC;  Surgeon: [name], MD;  Location: [place];  Service: Orthopedics;  Laterality: Right;  . AV FISTULA PLACEMENT Left 04/15/2015   Procedure: ARTERIOVENOUS (AV) FISTULA CREATION;   Surgeon: Mal Misty, MD;  Location: [place];  Service: Vascular;  Laterality: Left;  . EYE SURGERY Right    retinal detachment  . FISTULA SUPERFICIALIZATION Left 06/03/2015   Procedure: LEFT UPPER ARM BRACHIOCEPHALIC FISTULA SUPERFICIALIZATION;  Surgeon: Mal Misty, MD;  Location: [place];  Service: Vascular;  Laterality: Left;  from [place] to [facility]  . STUMP REVISION Right 05/24/2018   Procedure: REVISION RIGHT TRANSMETATARSAL AMPUTATION WITH EXCISION BONE/SOFT TISSUE, LOCAL TISSUE  RE-ARRANGEMENT  AND VAC PLACEMENT;  Surgeon: [name], MD;  Location: [place];  Service: Orthopedics;  Laterality: Right;   Family History:   Family History  Problem Relation Age of Onset  . Heart disease Mother   . Heart disease Father    Social History:  reports that she has never smoked. She has never used smokeless tobacco. She reports that she does not drink alcohol and does not use drugs. No Known Allergies Prior to Admission medications   Medication Sig Start Date End Date Taking? Authorizing Provider  acetaminophen (TYLENOL) 500 MG tablet Take 1,000 mg by mouth every 6 (six) hours as needed for mild pain or headache.    Yes [provider]  aspirin EC 81 MG tablet Take 1 tablet (81 mg total) by mouth daily. 04/30/18  Yes Kilroy, Luke K, PA-C  atorvastatin (LIPITOR) 80 MG tablet TAKE 1 TABLET(80 MG) BY MOUTH AT BEDTIME Patient taking differently: Take 80 mg by mouth at bedtime. 11/04/19  Yes Benay Pike, MD  Blood Glucose Monitoring Suppl (ONE  TOUCH ULTRA 2) w/Device KIT 1 kit by Does not apply route 3 (three) times daily. ICD-10 code: E11.40 03/26/18  Yes Oscar La, Arlo C, PA-C  cetirizine (ZYRTEC) 10 MG tablet Take 10 mg by mouth as needed for allergies.  04/26/18  Yes [provider]  cyclobenzaprine (FLEXERIL) 10 MG tablet TAKE 1 TABLET(10 MG) BY MOUTH THREE TIMES DAILY BETWEEN MEALS AND AT BEDTIME AS NEEDED FOR MUSCLE SPASMS Patient taking differently: Take 10 mg by mouth 3  (three) times daily as needed for muscle spasms. 12/09/19  Yes Benay Pike, MD  Doxepin HCl 5 % CREA Apply to affected area up to 4 times a day as needed for up to eight days. 01/08/20  Yes Benay Pike, MD  fluticasone [facility]) 50 MCG/ACT nasal spray Place 1 spray into both nostrils daily. Patient taking differently: Place 1 spray into both nostrils daily as needed for allergies. 06/16/19  Yes Benay Pike, MD  glucose blood (ONE TOUCH ULTRA TEST) test strip 1 each by Other route 3 (three) times daily. ICD-10 code: E11.40. 03/26/18  Yes Lassen, Arlo C, PA-C  HYDROcodone-acetaminophen (NORCO/VICODIN) 5-325 MG tablet Take 1-2 tablets by mouth every 6 (six) hours as needed. Patient taking differently: Take 1-2 tablets by mouth every 6 (six) hours as needed for moderate pain. 02/05/20  Yes Davonna Belling, MD  hydrOXYzine (ATARAX/VISTARIL) 10 MG tablet Take 1 tablet (10 mg total) by mouth daily as needed for itching. 01/15/20  Yes Beard, Samantha N, DO  Insulin Pen Needle (B-D UF III MINI PEN NEEDLES) 31G X 5 MM MISC USE AS DIRECTED TWICE DAILY 04/30/19  Yes Benay Pike, MD  Insulin Pen Needle 29G X 12MM MISC Inject 1 pen into the skin 2 (two) times daily. Check blood sugar daily. 03/26/18  Yes Lassen, Arlo C, PA-C  Insulin Syringe-Needle U-100 Flossie Buffy INSULIN SYRINGE) 30G X 5/16" 1 ML MISC USE AS DIRECTED 03/26/18  Yes Oscar La, Arlo C, PA-C  Iron Sucrose (VENOFER IV) Inject 1 Dose as directed See admin instructions. With dialysis 02/04/20 02/13/20 Yes [provider]  OneTouch Delica Lancets 94W MISC 1 Device by Does not apply route as needed (to check blood glucose). 03/26/18  Yes Lassen, Arlo C, PA-C  traMADol (ULTRAM) 50 MG tablet Take 1 tablet (50 mg total) by mouth every 12 (twelve) hours as needed for severe pain. 01/28/20 02/27/20 Yes Meccariello, Bernita Raisin, DO  ferric citrate (AURYXIA) 1 GM 210 MG(Fe) tablet Take 2 tablets (420 mg total) by mouth 3 (three) times daily with meals. Give  two (420 mg) by mouth three times a day wih meals for ESRD Patient not taking: Reported on 02/24/2020 03/26/18   Wille Celeste, PA-C  nystatin ointment (MYCOSTATIN) Apply 1 application topically 2 (two) times daily. Patient not taking: Reported on 01/31/2020    [provider]  triamcinolone ointment (KENALOG) 0.5 % Apply 1 application topically 2 (two) times daily. Patient not taking: Reported on 01/31/2020    [provider]  progesterone (PROMETRIUM) 200 MG capsule Take 1 capsule (200 mg total) by mouth daily. Take for 10 days if bleeding occurs for more than 4 days. 11/09/10 03/28/11  Lyndal Pulley, DO   Current Facility-Administered Medications  Medication Dose Route Frequency Provider Last Rate Last Admin  . aspirin EC tablet 81 mg  81 mg Oral Daily Alcus Dad, MD   81 mg at 02/13/20 1002  . atorvastatin (LIPITOR) tablet 80 mg  80 mg Oral QHS Alcus Dad, MD  80 mg at 02/12/20 2137  . Chlorhexidine Gluconate Cloth 2 % PADS 6 each  6 each Topical Q0600 Donato Heinz, MD   6 each at 02/13/20 [phone]  . Doxepin HCl 5 % CREA 1 application  1 application Topical QID PRN Brimage, Vondra, DO      . gabapentin (NEURONTIN) 250 MG/5ML solution 50 mg  50 mg Oral QHS Carollee Leitz, MD   50 mg at 02/12/20 2136  . heparin injection 5,000 Units  5,000 Units Subcutaneous Q8H Alcus Dad, MD   5,000 Units at 02/13/20 0606  . heparin sodium (porcine) 1000 UNIT/ML injection           . insulin aspart (novoLOG) injection 0-6 Units  0-6 Units Subcutaneous TID WC Carollee Leitz, MD      . midodrine (PROAMATINE) tablet 2.5 mg  2.5 mg Oral TID WC Dickie La, MD      . piperacillin-tazobactam (ZOSYN) IVPB 2.25 g  2.25 g Intravenous Q8H Lutheran, Carrie F,       . silver sulfADIAZINE (SILVADENE) 1 % cream   Topical BID Dickie La, MD      . traMADol Veatrice Bourbon) tablet 25 mg  25 mg Oral Q12H Carollee Leitz, MD   25 mg at 02/13/20 1002  . vancomycin (VANCOREADY) IVPB 750 mg/150 mL  750  mg Intravenous Q M,W,F-HD Amedeo Plenty, [facility]       Labs: Basic Metabolic Panel: Recent Labs  Lab 02/12/20 0430 02/12/20 1432 02/13/20 0048  NA 134* 132* 131*  K 5.0 4.6 4.9  CL 92* 89* 90*  CO2 _0 GLUCOSE 68* 120* 162*  BUN 43* 44* 48*  CREATININE 9.46* 10.18* 10.71*  CALCIUM 9.8 10.1 9.5  PHOS 8.2* 8.3* 7.6*   Liver Function Tests: Recent Labs  Lab 02/12/20 0430 02/12/20 1432 02/13/20 0048  ALBUMIN 2.1* 2.1* 2.0*   No results for input(s): LIPASE, AMYLASE in the last 168 hours. No results for input(s): AMMONIA in the last 168 hours. CBC: Recent Labs  Lab 02/21/2020 1053 02/13/2020 2045 02/12/20 0430 02/13/20 0204  WBC 34.6*  --  42.8* 37.8*  NEUTROABS 29.1*  --  35.2*  --   HGB 11.3* 11.9* 10.2* 10.0*  HCT 38.1 35.0* 33.2* 32.6*  MCV 103.5*  --  104.1* 104.5*  PLT 331  --  327 294   Cardiac Enzymes: No results for input(s): CKTOTAL, CKMB, CKMBINDEX, TROPONINI in the last 168 hours. CBG: Recent Labs  Lab 02/12/20 1228 02/12/20 1732 02/12/20 2106 02/13/20 0748 02/13/20 1201  GLUCAP 201* 133* 152* 148* 129*   Iron Studies: No results for input(s): IRON, TIBC, TRANSFERRIN, FERRITIN in the last 72 hours. Studies/Results: DG Chest 2 View  Result Date: 03/07/2020 CLINICAL DATA:  Cough leukocytosis EXAM: CHEST - 2 VIEW COMPARISON:  02/05/2020 FINDINGS: Enlargement of cardiac silhouette. Clothing artifacts project over chest. Mediastinal contours and pulmonary vascularity normal. Atherosclerotic calcification aorta. Patchy BILATERAL pulmonary infiltrates which could represent multifocal pneumonia or less likely pulmonary edema. Slight improvement in aeration in the RIGHT mid lung and LEFT upper lobe since prior study. No pleural effusion or pneumothorax. Osseous structures unremarkable. IMPRESSION: Patchy BILATERAL pulmonary infiltrates favor multifocal pneumonia over pulmonary edema. Electronically Signed   By: Lavonia Dana M.D.   On: 02/17/2020 17:59   CT  ABDOMEN PELVIS W CONTRAST  Result Date: 02/27/2020 CLINICAL DATA:  Abdominal pain.  Abscess. EXAM: CT ABDOMEN AND PELVIS WITH CONTRAST TECHNIQUE: Multidetector CT imaging of the abdomen and pelvis was  performed using the standard protocol following bolus administration of intravenous contrast. CONTRAST:  116m OMNIPAQUE IOHEXOL 300 MG/ML  SOLN COMPARISON:  January 31, 2020 FINDINGS: Lower chest: There is atelectasis and scarring at the lung bases, similar to prior study.The heart is enlarged. Hepatobiliary: There is probable hepatic steatosis. There is gallbladder sludge without CT evidence for acute cholecystitis.There is no biliary ductal dilation. Pancreas: Normal contours without ductal dilatation. No peripancreatic fluid collection. Spleen: Unremarkable. Adrenals/Urinary Tract: --Adrenal glands: Unremarkable. --Right kidney/ureter: The right kidney is atrophic. --Left kidney/ureter: The left kidney is atrophic. --Urinary bladder: Bladder is decompressed which limits evaluation. Stomach/Bowel: --Stomach/Duodenum: No hiatal hernia or other gastric abnormality. Normal duodenal course and caliber. --Small bowel: Unremarkable. --Colon: There is scattered colonic diverticula without CT evidence for diverticulitis. --Appendix: The appendix is dilated but air-filled and does not demonstrate surrounding inflammatory changes. Vascular/Lymphatic: Atherosclerotic calcification is present within the non-aneurysmal abdominal aorta, without hemodynamically significant stenosis. --No retroperitoneal lymphadenopathy. --No mesenteric lymphadenopathy. --No pelvic or inguinal lymphadenopathy. Reproductive: Unremarkable Other: There is diffuse subcutaneous fat stranding involving the posterior and anterior abdominal walls. This is essentially stable across prior studies. The abdominal wall is normal. Musculoskeletal. No acute displaced fractures. IMPRESSION: 1. No acute abnormality. 2. Multiple chronic findings as detailed above,  not substantially changed from recent prior study. Electronically Signed   By: CConstance HolsterM.D.   On: 02/29/2020 19:33   CT FEMUR LEFT W CONTRAST  Result Date: 03/03/2020 CLINICAL DATA:  Soft tissue mass of the thigh. Concern for cellulitis. EXAM: CT OF THE LOWER BILATERAL EXTREMITY WITH CONTRAST TECHNIQUE: Multidetector CT imaging of the lower bilateral extremities was performed according to the standard protocol following intravenous contrast administration. CONTRAST:  1068mOMNIPAQUE IOHEXOL 300 MG/ML  SOLN COMPARISON:  None. FINDINGS: Evaluation of the patient's pelvis does not demonstrate an acute abnormality. There is no acute osseous abnormality involving the visualized osseous structures. There are advanced vascular calcifications bilaterally. There is nonspecific lower extremity soft tissue edema with multiple associated subcutaneous calcifications. There is no abscess. No drainable fluid collection. There is mild overlying skin thickening. Lower extremity muscular atrophy is noted. There are no pockets subcutaneous gas. There are mild-to-moderate degenerative changes of both hips. IMPRESSION: 1. No acute osseous abnormality. 2. There is nonspecific lower extremity edema without evidence for an abscess. Findings are nonspecific but can be seen in patients with cellulitis in the appropriate clinical setting. Other differential considerations include chronic venous insufficiency given the presence of subcutaneous calcifications. 3. No radiographic evidence for osteomyelitis. 4.  Aortic Atherosclerosis (ICD10-I70.0). Electronically Signed   By: ChConstance Holster.D.   On: 02/19/2020 19:39   CT FEMUR RIGHT W CONTRAST  Result Date: 02/12/2020 CLINICAL DATA:  Soft tissue mass of the thigh. Concern for cellulitis. EXAM: CT OF THE LOWER BILATERAL EXTREMITY WITH CONTRAST TECHNIQUE: Multidetector CT imaging of the lower bilateral extremities was performed according to the standard protocol following  intravenous contrast administration. CONTRAST:  1005mMNIPAQUE IOHEXOL 300 MG/ML  SOLN COMPARISON:  None. FINDINGS: Evaluation of the patient's pelvis does not demonstrate an acute abnormality. There is no acute osseous abnormality involving the visualized osseous structures. There are advanced vascular calcifications bilaterally. There is nonspecific lower extremity soft tissue edema with multiple associated subcutaneous calcifications. There is no abscess. No drainable fluid collection. There is mild overlying skin thickening. Lower extremity muscular atrophy is noted. There are no pockets subcutaneous gas. There are mild-to-moderate degenerative changes of both hips. IMPRESSION: 1. No acute osseous abnormality. 2. There is  nonspecific lower extremity edema without evidence for an abscess. Findings are nonspecific but can be seen in patients with cellulitis in the appropriate clinical setting. Other differential considerations include chronic venous insufficiency given the presence of subcutaneous calcifications. 3. No radiographic evidence for osteomyelitis. 4.  Aortic Atherosclerosis (ICD10-I70.0). Electronically Signed   By: Constance Holster M.D.   On: 03/06/2020 19:39    ROS: Pertinent items are noted in HPI. Physical Exam: Vitals:   02/13/20 0357 02/13/20 0743 02/13/20 1115 02/13/20 1158  BP: (!) 99/42 (!) 85/45 118/61 (!) 150/114  Pulse: 90 88  89  Resp: _0 Temp: 98.3 F (36.8 C) 98 F (36.7 C)  98.1 F (36.7 C)  TempSrc: Axillary Axillary  Axillary  SpO2: 90% 92%  95%  Weight:      Height:          Weight change:   Intake/Output Summary (Last 24 hours) at 02/13/2020 1344 Last data filed at 02/12/2020 1904 Gross per 24 hour  Intake 240 ml  Output --  Net 240 ml   BP (!) 150/114 (BP Location: Right Arm)   Pulse 89   Temp 98.1 F (36.7 C) (Axillary)   Resp 18   Ht _1  (1.626 m)   Wt 81.6 kg   LMP 09/18/2011   SpO2 95%   BMI 30.90 kg/m   General:  Obese AAF,  confused/delirious, NAD HEENT:  [place]/AT, EOMI, no icterus CVS: RRR, no rub LUNGS: diminished BS bilaterally ABD:  +BS, obese, soft, NT/ND Ext: indurated areas on thighs, tender to palpation, no edema, LUE AVF +T/B  Dialysis Access: LUE AVF  Dialysis Orders: MWF @ [facility] 3:30hr, 450/800, EDW 97kg, 2K/2Ca, UFP #4, AVF, heparin 8000u bolus - Sensipar 30 mg PO q HD - Hectoral 56mg IV q HD - Mircera 100 q 2 weeks - ordered, not yet given, Hgb 10.8 with tsat 9 on 01/28/20  Assessment/Plan: 1.  Altered Mental Status - apparently started yesterday afternoon.  She did receive tramadol as well as gabapentin, however she remains confused again today.  Was also found to have multiple hypotensive events and thought this could be related to her AMS.  Workup per primary svc.  Has also had multiple episodes of hypoglycemia. 2. Acute hypoxic respiratory failure due to covid-19 pneumonia - currently on 3 liters [place] and CPAP qhs.  Already given IV remdesivir at last admission. 3.  ESRD -  Plan for HD today on covid shift.  Treatments will be truncated due to census 4.  Hypertension/volume  - UF as tolerated but doubtful to get much with episodes of hypotension.  Continue with midodrine and follow.  5.  Anemia  - Hgb stable 6.  Metabolic bone disease -  Resume outpatient meds 7.  Nutrition - renal, carb modified 8. Calciphylaxis- of bilateral thighs, chronic  JDonetta Potts MD CItmannPager [phone]/04/2020, 1:44 PM

## 2020-02-13 NOTE — Code Documentation (Signed)
  Patient Name: Rachel Chaney   MRN: [id number]   Date of Birth/ Sex: 1960/04/02 , female      Admission Date: 02/25/2020  Attending Provider: Juanito Doom, MD  Primary Diagnosis: <principal problem not specified>   Indication: Pt was in her usual state of health until this afternoon when she was noted to be unresponsive in PEA arrest. Code blue was subsequently called at 13:58. At the time of arrival on scene, ACLS protocol was underway.   Technical Description:  - CPR performance duration:  5 minutes  - Was defibrillation or cardioversion used? No   - Was external pacer placed? Yes  - Was patient intubated pre/post CPR? Yes   Medications Administered: Y = Yes; Blank = No Amiodarone  Y  Atropine    Calcium    Epinephrine  Y  Lidocaine    Magnesium    Norepinephrine    Phenylephrine    Sodium bicarbonate  Y  Vasopressin     Post CPR evaluation:  - Final Status - Was patient successfully resuscitated ? Yes - What is current rhythm? NSR - What is current hemodynamic status? stable  Miscellaneous Information:  - Labs sent, including: BMP, ABG, CBC  - Primary team notified?  Yes  - Family Notified? Yes  - Additional notes/ transfer status:  Transferred to 3M07     Alexandria Lodge, MD  02/13/2020, 3:00 PM

## 2020-02-13 NOTE — Progress Notes (Signed)
.   Pharmacy Antibiotic Note  Rachel Chaney is a 60 y.o. female admitted on 03/07/2020 with sepsis. Pharmacy has been consulted for vancomycin and piperacillin-tazobactam dosing. Of note, patient received vancomycin 2g load on 2/2. Last HD session 2/1, next session scheduled for today 2/4.  Plan: Initiate vancomycin 750mg  IV after each dialysis session (MWF schedule). Goal trough 15-20 mcg/mL.  Initiate piperacillin-tazobactam 2.25g IV every 8 hours  Follow up with cultures, antibiotic de-escalation and LOT Monitor renal function and clinical progress, obtain vancomycin trough when indicated  Height: 5\' 4"  (162.6 cm) Weight: 81.6 kg (180 lb) IBW/kg (Calculated) : 54.7  Temp (24hrs), Avg:98.1 F (36.7 C), Min:98 F (36.7 C), Max:98.3 F (36.8 C)  Recent Labs  Lab 02/29/2020 1053 02/23/2020 1657 02/12/20 0430 02/12/20 1432 02/13/20 0048 02/13/20 0204  WBC 34.6*  --  42.8*  --   --  37.8*  CREATININE 8.76*  --  9.46* 10.18* 10.71*  --   LATICACIDVEN  --  1.6  --   --   --   --     Estimated Creatinine Clearance: 5.7 mL/min (A) (by C-G formula based on SCr of 10.71 mg/dL (H)).    No Known Allergies  Antimicrobials this admission: Vancomycin 2/2 x1, 2/4 >>  Pip/tazo 2/4 >>   Dose adjustments this admission: N/a  Microbiology results: 2/2 BCx: NGTD  Thank you for allowing pharmacy to be a part of this patient's care.  Mercy Riding, PharmD PGY1 Acute Care Pharmacy Resident Please refer to [facility] for unit-specific pharmacist

## 2020-02-13 NOTE — Progress Notes (Signed)
MD called regarding low BP trend. BP now 85/45 (MAP 51). Given order for 564ml bolus normal saline. Upon bolus initiation, pt's only IV was found in bed. Unsuccessful attempt x2 in posterior R FA. STAT IV team consult placed for difficult start. Will start bolus upon IV placement.  MD made aware of lethargy throughout yesterday afternoon, overnight, and into this morning. Night shift RN notified me of small amount of blood found on purewick overnight. Patient has been postmenopausal for years. MD aware.   WOC consult placed for assessment of possible deep tissue injury to R hip. This appears as a healed pressure injury, but patient reported pain at the site when touched and the area is darker than her normal skin tone.

## 2020-02-13 NOTE — Procedures (Signed)
Central Venous Catheter Insertion Procedure Note Rachel Chaney [id number] 11-Jan-1960  Procedure: Insertion of Central Venous Catheter Indications: hemodialysis  Procedure Details Consent: Risks of procedure as well as the alternatives and risks of each were explained to the (patient/caregiver).  Consent for procedure obtained. Time Out: Verified patient identification, verified procedure, site/side was marked, verified correct patient position, special equipment/implants available, medications/allergies/relevent history reviewed, required imaging and test results available.  Performed  Maximum sterile technique was used including antiseptics, cap, gloves, gown, hand hygiene, mask and sheet. Skin prep: Chlorhexidine; local anesthetic administered A antimicrobial bonded/coated single lumen angiocatheter was placed in the right internal jugular vein using the Seldinger technique.  The wire could not pass beyond 10cm due to a central obstruction.  Ultrasound was used to verify the patency of the vein and for real time needle guidance.  Evaluation Blood flow good Complications: No apparent complications Patient did tolerate procedure well. Chest X-ray ordered to verify placement.  CXR: pending.  Rachel Chaney 02/13/2020, 4:26 PM

## 2020-02-13 NOTE — Progress Notes (Signed)
Spoke with Dr. Pilar Plate.  He has order another ABG to be completed when the patient returns from dialysis.  He also wanted to make sure that we have a CPAP bedside for when she is sleeping.  Passed on information to McKenzie, RN who is taking care of the patient.

## 2020-02-13 NOTE — Anesthesia Procedure Notes (Signed)
Procedure Name: Intubation Date/Time: 02/13/2020 2:07 PM Performed by: Audry Pili, MD Pre-anesthesia Checklist: Patient identified, Emergency Drugs available, Suction available, Patient being monitored and Timeout performed Patient Re-evaluated:Patient Re-evaluated prior to induction Oxygen Delivery Method: Ambu bag Preoxygenation: Pre-oxygenation with 100% oxygen Laryngoscope Size: Glidescope and 3 Grade View: Grade II Tube type: Oral Tube size: 7.5 mm Number of attempts: 1 Airway Equipment and Method: Stylet and Video-laryngoscopy Placement Confirmation: ETT inserted through vocal cords under direct vision,  breath sounds checked- equal and bilateral and CO2 detector Secured at: 22 cm Tube secured with: Tape Dental Injury: Teeth and Oropharynx as per pre-operative assessment  Comments: Emergent glidescope intubation by MDA after attempt by RT after code called while pt in HD. Pt unresponsive, no IV induction indicated.

## 2020-02-13 NOTE — Consult Note (Addendum)
PULMONARY / CRITICAL CARE MEDICINE   NAME:  Rachel Chaney, MRN:  [id number], DOB:  02/03/1960, LOS: 1 ADMISSION DATE:  02/26/2020, CONSULTATION DATE:  02/13/20 REFERRING MD:  Dolores Hoose , CHIEF COMPLAINT:  PEA arrest  BRIEF HISTORY:    Rachel Chaney is a63 y.o. female with a pPMH of ESRD on HD (MWF), Chronic hypoxic respiratory failure, T2DM, HFpEF, HTN, HLD, CAD s/p MI (2020) who presented to [facility] with chronic nonspecific skin pain over her bilateral lower extremities and her hospitalization has been complicated by acute on chronic hypoxic respiratory failure and hypotension.   HISTORY OF PRESENT ILLNESS   Rachel Chaney is a21 y.o. female with a pPMH of ESRD on HD (MWF), Chronic hypoxic respiratory failure, T2DM, HFpEF, HTN, HLD, CAD s/p MI (2020) who presented to [facility] with chronic nonspecific skin pain over her bilateral lower extremities and her hospitalization has been complicated by acute on chronic hypoxic respiratory failure and hypotension.   Patient was recently diagnosed with COVID on 01/2020 and received only two dose of Remdesivir and was discharged with 3L [place] supplemental oxygen at home. She presents to the ED 02/02 with worsening chronic lower extremity pain and was found to be hypoxic at 64% on room air. CXR was positive for patchy multifocal infiltrates consistent with COVID-19 pneumonia. Patient developed acute hypotension in the 80/40s after returning from CT scan with concern for medication side effect from IV dilaudid. ABG was performed showing mixed respiratory and AGMA with hypercarbia.She was started on CPAP q HS. During the subsequent day, the patient was noted to be less responsive with confusions and disorientation. Patient was noted to have a fever of 100.9 on arrival but no hyperthermia since. She had a leukocytosis with left shift, which is chronic for her and hypotension concerning for infectious. Therefore, blood culture were drawn and empiric vancomycin and zosyn was started.  Code blue was call during HD session today due to PEA arrest and she was transferred to the ICU  SIGNIFICANT PAST MEDICAL HISTORY    Past Medical History:  Diagnosis Date  . Abnormal appearance of cervix 08/08/2011  . Acute respiratory failure with hypoxia ([place])   . Blurred vision 01/28/2020  . Cardiac arrest ([place]) 02/28/2018   while on hemodialysis in hospital  . CHF (congestive heart failure) ([place])   . Chronic kidney disease due to diabetes mellitus ([place]) 10/13/2013  . Community acquired bilateral lower lobe pneumonia 11/11/2019  . COVID-19 01/31/2020  . Diabetes mellitus    Type II  . ESRD (end stage renal disease) ([place])    MWF - [facility]  . Excess fluid volume 12/16/2019  . Hyperlipidemia   . Hypertension   . Neuropathy     SIGNIFICANT EVENTS:  02/02 - Admitted for leg pain found hypoxic and hypotensive.  02/04 - PEA arrest STUDIES:   01/23 - Echo: 1. Left ventricular ejection fraction, by estimation, is 60 to 65%. The  left ventricle has normal function. The left ventricle has no regional  wall motion abnormalities. There is severe concentric left ventricular  hypertrophy.  2. Right ventricular systolic function is mildly reduced. The right  ventricular size is mildly enlarged. There is mildly elevated pulmonary  artery systolic pressure. The estimated right ventricular systolic  pressure is 96.2 mmHg.  3. The mitral valve is abnormal. Mild mitral valve regurgitation.  Moderate mitral annular calcification.  4. The aortic valve is calcified. There is moderate calcification of the  aortic valve. There is moderate thickening of the  aortic valve. Aortic  valve regurgitation is trivial. Mild aortic valve stenosis.  5. The inferior vena cava is normal in size with <50% respiratory  variability, suggesting right atrial pressure of 8 mmHg.   Comparison(s): Compared to prior study, the RV appears only mildly  enlarged with mild systolic dysfunction.    02/02 -  CXR: Patchy BILATERAL pulmonary infiltrates favor multifocal pneumonia over pulmonary edema.  02/02 - CT Abdomen/Pelvis/ 1. No acute abnormality. 2. Multiple chronic findings as detailed above, not substantially changed from recent prior study.  02/02 - CT left femur: 1. No acute osseous abnormality. 2. There is nonspecific lower extremity edema without evidence for an abscess. Findings are nonspecific but can be seen in patients with cellulitis in the appropriate clinical setting. Other differential considerations include chronic venous insufficiency given the presence of subcutaneous calcifications. 3. No radiographic evidence for osteomyelitis. 4.  Aortic Atherosclerosis (ICD10-I70.0).  02/02 - CT right femur: 1. No acute osseous abnormality. 2. There is nonspecific lower extremity edema without evidence for an abscess. Findings are nonspecific but can be seen in patients with cellulitis in the appropriate clinical setting. Other differential considerations include chronic venous insufficiency given the presence of subcutaneous calcifications. 3. No radiographic evidence for osteomyelitis. 4.  Aortic Atherosclerosis (ICD10-I70.0).  CULTURES:  02/04 BCx -pending  ANTIBIOTICS:  02/04 - Vancomycin>> 02/04 Zosyn>>  LINES/TUBES:  02/03 - Peripheral, Rt lat arm, Rt IO>> 02/04 - HD CVC>>  CONSULTANTS:  N/a  SUBJECTIVE:  Patient presents to ICU Intubated.   CONSTITUTIONAL: BP (!) 150/114 (BP Location: Right Arm)   Pulse 89   Temp 98.1 F (36.7 C) (Axillary)   Resp 18   Ht 5' 4" (1.626 m)   Wt 81.6 kg   LMP 09/18/2011   SpO2 95%   BMI 30.90 kg/m   I/O last 3 completed shifts: In: 240 [P.O.:240] Out: -      Vent Mode: PRVC FiO2 (%):  [100 %] 100 % Set Rate:  [12 bmp] 12 bmp Vt Set:  [430 mL] 430 mL PEEP:  [5 cmH20] 5 cmH20  PHYSICAL EXAM: General: sedatedand intubated     Eyes: PEERLA ENTM: ET tube present Cardiovascular: RRR with no murmurs  noted Respiratory: unable to appreciate lung sounds due to MV  Gastrointestinal: Bowel sounds present. 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  2x BCx pending Leukocytois elevated form 30's>60's Continue Vancomycin and zosyn Will start levophed through peripheral line until CVC placed Will place a CVC now Will get aq procal and lactic acid Patient remains intubated. Appropriate for VAP protocol Aspiration precautions  ESRD on HD: HD last performed on Wednesday 02/02 HD cath placed today for RRT Waiting on repeat CMP for kidney function and electrolytes.   T2DM: - Will start q4 hour CBG - SSI as needed to maintain normal glucose levels.    Best Practice / Goals of Care / Disposition.   DVT PROPHYLAXIS: NPO SUP: PPI  NUTRITION: will likely need NG tube MOBILITY:bed rest GOALS OF CARE: na FAMILY DISCUSSIONS: today DISPOSITION Full Code  LABS  Glucose Recent Labs  Lab 02/12/20 0926 02/12/20 1228 02/12/20 1732 02/12/20 2106 02/13/20 4166  02/13/20 1201  GLUCAP 83 201* 133* 152* 148* 129*    BMET Recent Labs  Lab 02/12/20 0430 02/12/20 1432 02/13/20 0048  NA 134* 132* 131*  K 5.0 4.6 4.9  CL 92* 89* 90*  CO2 _0 BUN 43* 44* 48*  CREATININE 9.46* 10.18* 10.71*  GLUCOSE 68* 120* 162*    Liver Enzymes Recent Labs  Lab 02/12/20 0430 02/12/20 1432 02/13/20 0048  ALBUMIN 2.1* 2.1* 2.0*    Electrolytes Recent Labs  Lab 02/12/20 0430 02/12/20 1432 02/13/20 0048  CALCIUM 9.8 10.1 9.5  MG  --  1.9 1.9  PHOS 8.2* 8.3* 7.6*    CBC Recent Labs  Lab 02/22/2020 1053 03/04/2020 2045 02/12/20 0430 02/13/20 0204   WBC 34.6*  --  42.8* 37.8*  HGB 11.3* 11.9* 10.2* 10.0*  HCT 38.1 35.0* 33.2* 32.6*  PLT 331  --  327 294    ABG Recent Labs  Lab 03/04/2020 2045 02/13/20 0934  PHART 7.345* 7.275*  PCO2ART 58.9* 59.5*  PO2ART 64* 71.3*    Coag's No results for input(s): APTT, INR in the last 168 hours.  Sepsis Markers Recent Labs  Lab 02/29/2020 1657  LATICACIDVEN 1.6    Cardiac Enzymes No results for input(s): TROPONINI, PROBNP in the last 168 hours.  PAST MEDICAL HISTORY :   She  has a past medical history of Abnormal appearance of cervix (08/08/2011), Acute respiratory failure with hypoxia ([place]), Blurred vision (01/28/2020), Cardiac arrest ([place]) (02/28/2018), CHF (congestive heart failure) ([place]), Chronic kidney disease due to diabetes mellitus ([place]) (10/13/2013), Community acquired bilateral lower lobe pneumonia (11/11/2019), COVID-19 (01/31/2020), Diabetes mellitus, ESRD (end stage renal disease) ([place]), Excess fluid volume (12/16/2019), Hyperlipidemia, Hypertension, and Neuropathy.  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  No Known Allergies  No current facility-administered medications on file prior to encounter.   Current Outpatient Medications on File Prior to Encounter  Medication Sig  . acetaminophen (TYLENOL) 500 MG tablet Take 1,000 mg by mouth every 6 (six) hours as needed for mild pain or headache.   Marland Kitchen aspirin EC 81 MG tablet Take 1 tablet (81 mg total) by mouth daily.  Marland Kitchen atorvastatin (LIPITOR) 80 MG tablet TAKE 1 TABLET(80 MG) BY MOUTH AT BEDTIME (Patient taking differently: Take 80 mg by mouth at bedtime.)  . Blood Glucose Monitoring Suppl (ONE TOUCH ULTRA 2) w/Device KIT 1 kit by Does not apply route 3 (three) times daily. ICD-10 code: E11.40  .  cetirizine (ZYRTEC) 10 MG tablet Take 10 mg by mouth as needed for allergies.   . cyclobenzaprine (FLEXERIL) 10 MG tablet TAKE 1 TABLET(10 MG) BY MOUTH THREE TIMES DAILY BETWEEN MEALS AND AT BEDTIME AS NEEDED FOR MUSCLE SPASMS (Patient taking differently: Take 10 mg by mouth 3 (three) times daily as needed for muscle spasms.)  . Doxepin HCl 5 % CREA Apply to affected area up to 4 times a day as needed for up to eight days.  . fluticasone (FLONASE) 50 MCG/ACT nasal spray Place 1 spray into both nostrils daily. (Patient taking differently: Place 1 spray into both nostrils daily as needed for allergies.)  . glucose blood (ONE TOUCH ULTRA TEST) test strip 1 each by Other route 3 (three) times daily. ICD-10 code: E11.40.  Marland Kitchen HYDROcodone-acetaminophen (NORCO/VICODIN) 5-325 MG tablet Take 1-2 tablets by mouth every 6 (six)  hours as needed. (Patient taking differently: Take 1-2 tablets by mouth every 6 (six) hours as needed for moderate pain.)  . hydrOXYzine (ATARAX/VISTARIL) 10 MG tablet Take 1 tablet (10 mg total) by mouth daily as needed for itching.  . Insulin Pen Needle (B-D UF III MINI PEN NEEDLES) 31G X 5 MM MISC USE AS DIRECTED TWICE DAILY  . Insulin Pen Needle 29G X 12MM MISC Inject 1 pen into the skin 2 (two) times daily. Check blood sugar daily.  . Insulin Syringe-Needle U-100 (ULTICARE INSULIN SYRINGE) 30G X 5/16" 1 ML MISC USE AS DIRECTED  . Iron Sucrose (VENOFER IV) Inject 1 Dose as directed See admin instructions. With dialysis  . OneTouch Delica Lancets 90Z MISC 1 Device by Does not apply route as needed (to check blood glucose).  . traMADol (ULTRAM) 50 MG tablet Take 1 tablet (50 mg total) by mouth every 12 (twelve) hours as needed for severe pain.  . ferric citrate (AURYXIA) 1 GM 210 MG(Fe) tablet Take 2 tablets (420 mg total) by mouth 3 (three) times daily with meals. Give two (420 mg) by mouth three times a day wih meals for ESRD (Patient not taking: Reported on 02/29/2020)  . nystatin  ointment (MYCOSTATIN) Apply 1 application topically 2 (two) times daily. (Patient not taking: Reported on 01/31/2020)  . triamcinolone ointment (KENALOG) 0.5 % Apply 1 application topically 2 (two) times daily. (Patient not taking: Reported on 01/31/2020)  . [DISCONTINUED] progesterone (PROMETRIUM) 200 MG capsule Take 1 capsule (200 mg total) by mouth daily. Take for 10 days if bleeding occurs for more than 4 days.    FAMILY HISTORY:   Her family history includes Heart disease in her father and mother.  SOCIAL HISTORY:  She  reports that she has never smoked. She has never used smokeless tobacco. She reports that she does not drink alcohol and does not use drugs.  REVIEW OF SYSTEMS:    ROS note performed due to intubated.   Lawerance Cruel, D.O.  Internal Medicine Resident, PGY-2 [facility]  Pager: [phone] 3:56 PM, 02/13/2020

## 2020-02-13 NOTE — Progress Notes (Addendum)
CALL PAGER [phone] for any questions or notifications regarding this patient   FMTS Attending Event Note: Rachel Mcmurray MD  Attending [phone]  office [phone]   UPDATE Issues we discussed on morning rounds and which we were in the midst of im[plementing include: 1. Intermittent AMS with somnolence, with decreased MAP overnight and continued hypotension despite small fluid bolus. Is there underlying infectious process? She has underlying leukocytosis that is chronic, although it is more elevated than typically. She has been seen in Hem?Onc for this and recently her PCP, Dr. Jeannine Kitten had discussed eith Hem/Onc and they declined referral. We are conisdering reaching back out to them during this hospitalization as her initial work up was about 2017.  Blood culture and adding broad spectrum antibiotics  Consideration of adding low dose midodrine for hypotension in setting of ESRD and MAP < 65 last night Repeat of ABG as her morning blood gas showed elevated CO2. I had discussed with Drs. Pray and Eniola who have cared for her before and know her clinical history well. Dr. Thompson Grayer mentioned that she was very brittle from a respiratory standpoint in that if she missed her CPAP at night she had somnolence and altered mental status; additionally she mentioned that [name] did not "like" to use her CPQAP. Her home CPAP is nasal pillows. We discussed whether or not she would be  More likely to use if we brought in her home machine. 2. Additional history obtained this AM from nursing: This morning, her husband had told her nurse that she "survived CPR during dialysis about 4 months ago." We looked back in her chart and it was actually > 1 year ago on Feb 02/2018  [place] with ROSC Today, during dialysis she became nonresponsive, a CODE BLUE was called and they achieved ROSC. She has been transferred to ICU and I have spoken at length with Mr. Lung (husband) and daughter Arita Miss, updating them and  answering as many questions as I could. Dr. Pilar Plate who was present for part of the CODE was also present for the meeting. The family was appreciative. I am available by pager if the family has any questions for me or my team about her care up till now. We have notified the ICU team that the fam,ily is waitring and have taken them to waiting area.

## 2020-02-13 NOTE — Progress Notes (Signed)
.   Pharmacy Antibiotic Note  Rachel Chaney is a 60 y.o. female admitted on 02/25/2020 with sepsis.   Patient now to start CRRT  Height: 5\' 4"  (162.6 cm) Weight: 95.2 kg (209 lb 14.1 oz) (bed weight) IBW/kg (Calculated) : 54.7  Temp (24hrs), Avg:98.1 F (36.7 C), Min:97.9 F (36.6 C), Max:98.3 F (36.8 C)  Recent Labs  Lab 02/28/2020 1053 02/21/2020 1657 02/12/20 0430 02/12/20 1432 02/13/20 0048 02/13/20 0204 02/13/20 1505  WBC 34.6*  --  42.8*  --   --  37.8* 60.5*  CREATININE 8.76*  --  9.46* 10.18* 10.71*  --  9.25*  LATICACIDVEN  --  1.6  --   --   --   --  5.8*    Estimated Creatinine Clearance: 7.1 mL/min (A) (by C-G formula based on SCr of 9.25 mg/dL (H)).    No Known Allergies  Plan: Adjust zosyn to 3.375 gm q6h Vanc random in am before redosing  Barth Kirks, PharmD, BCPS, BCCCP Clinical Pharmacist [phone]  Please check AMION for all [place] numbers  02/13/2020 7:35 PM

## 2020-02-13 NOTE — Assessment & Plan Note (Signed)
Endometrial biopsy negative for hyperplasia or atypia 12 2021

## 2020-02-13 NOTE — Progress Notes (Signed)
Family Medicine Teaching Service Daily Progress Note Intern Pager: [phone]  Patient name: Rachel Chaney Medical record number: [id number] Date of birth: 1960/07/01 Age: 60 y.o. Gender: female  Primary Care Provider: Benay Pike, MD Consultants: Nephrology  Code Status: Full  Pt Overview and Major Events to Date:  Skin lesions with uncontrolled pain, ongoing Altered mental status, began 2/3, ongoing Hypotension with low MAPs, began 2/3, ongoing Acute on chronic respiratory failure requiring increased oxygen demand, resolved  Assessment and Plan: ASTER SCREWS is a 60 y.o. female presenting with "skin pain" and decreased oxygen saturation not on home oxygen. PMH is significant for ESRD on HD MWF, cardiac arrest s/p ACLS with ROSC (02/28/18), chronic hypoxic respiratory failure, T2DM, HFpEF, HTN, h/o MI (2020), HLD.   Altered Mental Status Upon evaluation in afternoon of 2/3, attending and nurse were concerned of change in mental status. Patient was less responsive and was falling asleep mid-sentence. Patient had recently received 50mg  Tramadol, decreased ordered dose to 25mg , as well as gabapentin to 50mg . However, due to her maintaining this decreased level of cognitive ability, this is less likely due to medications.  Of note, nurse spoke with husband who states since she was found unresponsive at dialysis "four months ago" she has been increasingly confused and disoriented. Patient has history of cardiac arrest 02/2018, but no documentation for more recent LOC at dialysis.  Potential causes of patient's AMS: *Infection: Patient had one fever of 100.9 since admission, one dose of Vanc, blood cultures collected NGTD. CXR showed multifocal PNA, but no significant change from 1/22 when diagnosed with COVID. CURB-65 score: 3 (when SBP <90 or DBP <60) CT abdomen/pelvis and bilateral femurs showing nonspecific edema but does not clinically correlate with cellulitis (lesions on  legs/abdomen are not warm, erythematous, or draining). Chronic leukocytosis, but WBC elevated from baseline (up to 42.8 on admission, baseline between 15-20 since 2012). -Repeat blood cultures -Start Vancomycin and Zosyn, per pharmacy for renal dosing  -Vitals q2 for 12 hours, then q4 for 12 additional hours *Global cerebral ischemia: patient was found to be hypotensive with MAPs as low as 51 over last 12 hours. Could be contributing to decreased blood delivery to brain causing altered status. *Hypoxia: Patient on 3L O2 at home since cardiac arrest in 02/2018. Upon ED presentation, patient was found to be satting 64% on room air, but since admission is back to 90-95% on 3L. Admission ABG: 7.345/58.9/64/32.2/90%. Documented use of CPAP at night, unsure if patient used this 2/3.  -Repeat ABG -Continue oxygen therapy, monitor saturation status (goal of 92%) *Hypoglycemia: previous history of CBG of 64 in previous admission (01/31/20) and subsequently discontinued home insulin. Glucose levels 110-201 this admission with A1c of 5.5%. No insulin given this admission. -Sensitive sliding scale insulin -CBG checks 4x daily before meals and at bedtime -Hypoglycemia protocol  Hypotension  Decreased MAP Pt given 0.5 mg IV Diluadid in the ED for pain. Shortly after returning from CT her BP dropped 80s/40s and became acutely confused.  Starting 2/3 afternoon, BPs started to drop to 102-85/64-45 with MAPs ranging from 78-51.  -Given 500cc bolus 2/4 am -Midodrine 2.5mg  TID with meals -Avoid Dilaudid at this time  Acute on Chronic Hypoxic Respiratory Failure  Recent COVID-19 (01/31/20) Stable. Patient uses 3L O2 at home but did not bring it with her to ED and was found to be 64% on room air. She tested positive for COVID-19 on 01/31/2020 and received remdesevir x2 during prior admission. CXR 2/2 shows  patchy bilateral infiltrates c/w multifocal PNA likely due to recent COVID infection.  ABG in the ED and showed  pH 7.345, pO2 64, pCO2 59, bicarb 32.2. Etiology of her hypoxemia likely multifactorial secondary to noncompliance with home O2 and COVID sequela infection.  Patient currently stable on baseline 3L with no respiratory distress. -Continuous pulse ox, goal SpO2 >92%, vital signs per unit routine -Airborne/contact precautions (until 21 days from initial positive test, per hospital protocol) -Incentive spirometry -CPAP qhs -Close monitoring  Calciphylaxis  Lichen Simplex Chronicus  Uncontrolled Pain  Presented to ED with skin pain. Patient with several months of itchy, painful skin lesions on bilateral legs and abdomen. She has been seen multiple times by PCP and in the ED for her symptoms. Biopsy of papular lesions on 01/01/2020 was consistent with lichen simplex chronicus. Multiple providers have also felt there is an additional component of calciphylaxis. No signs of cellulitis or abscess on exam/imaging. Pt given 0.5 mg IV Dilaudid in the ED and became hypotensive (87/40), resolved with fluid bolus.  Note: Patient's corrected calcium level is 11.0. No documentation of phosphate binders or hypercalcemia treatment. Additionally, no PTH workup. She has trialed Triamcinolone and Doxepin cream as well as hydroxyzine for itching and Tramadol 25mg  q12h prn for pain.   -Continue Doxepin cream PRN; wound care using chlorhexidine pads and silver sulfadiazine cream -Gabapentin 50mg  qhs, titrate for HD dosing, tramadol 25mg  q12; limit PRNpain medication    Leukocytosis WBC 34.6 on admission. She does have a left shift, with increased monocytes and immature granulocytes. Patient with a hx of persistent leukocytosis, last seen by hematology in 2016. She did receive vancomycin in the ED. Given patient's history, this is less likely infectious-- patient is afebrile, lactic acid 1.6, skin wounds not consistent with cellulitis (no warmth or surrounding erythema, no abscess), no source identified on CT  abdomen/pelvis and no bacterial PNA on CXR. Discontinue vancomycin. Will monitor for fever and follow up on blood cultures drawn in the ED.  -Daily CBC -Follow-up blood cx results: no growth at 1 day -Hold antibiotics at this time -Outpatient heme-onc f/u  ESRD on HD MWF  Anemia of Chronic Disease Chronic, stable. On admission: Cr 8.76, anion gap 16, chloride 90, electrolytes otherwise wnl. Hgb 11.3, which is her baseline. Last HD 2/1.  -HD MWF per PTA schedule -Daily renal function panel/CBC -Renal dosing of meds as appropriate -Consulted renal, appreciate recs  T2DM Glucose 110 on arrival. Was previously on insulin, but this was discontinued during her most recent admission (02/01/2020) due to hypoglycemia. Reports CBG 45 at home 2 days ago. A1c on admission 5.5% down from 9.0% on 11/12/2019. -Hypoglycemia percautions -ssSI and CBG monitoring qAC and qHS -Outpatient PCP f/u  HLD Chronic, stable. Most recent lipid panel from 11/12/2019: total cholesterol 79, HDL 31, LDL 35, triglycerides 65. -Continue home atorvastatin 80mg  daily   FEN/GI: Renal diet with 1200cc fluid restriction  Prophylaxis: Heparin 5ku   Disposition: Observation status, FPTS, attending Dr. Nori Riis  Subjective:  See upper level attestation  Objective: Temp:  [98 F (36.7 C)-98.5 F (36.9 C)] 98 F (36.7 C) (02/04 0743) Pulse Rate:  [73-90] 88 (02/04 0743) Resp:  [16-20] 19 (02/04 0743) BP: (85-142)/(42-64) 85/45 (02/04 0743) SpO2:  [90 %-99 %] 92 % (02/04 0743) Physical Exam: See upper level attestation  Laboratory: Recent Labs  Lab 02/20/2020 1053 02/28/2020 2045 02/12/20 0430 02/13/20 0204  WBC 34.6*  --  42.8* 37.8*  HGB 11.3* 11.9* 10.2* 10.0*  HCT  38.1 35.0* 33.2* 32.6*  PLT 331  --  327 294   Recent Labs  Lab 02/12/20 0430 02/12/20 1432 02/13/20 0048  NA 134* 132* 131*  K 5.0 4.6 4.9  CL 92* 89* 90*  CO2 24 26 24   BUN 43* 44* 48*  CREATININE 9.46* 10.18* 10.71*  CALCIUM 9.8 10.1  9.5  GLUCOSE 68* 120* 162*     Imaging/Diagnostic Tests: DG Chest 2 View 02/29/2020 IMPRESSION:  Patchy BILATERAL pulmonary infiltrates favor multifocal pneumonia over pulmonary edema.  CT ABDOMEN PELVIS W CONTRAST 02/15/2020 IMPRESSION:  1. No acute abnormality.  2. Multiple chronic findings as detailed above, not substantially changed from recent prior study.  CT FEMUR LEFT W CONTRAST 02/18/2020 IMPRESSION:  1. No acute osseous abnormality.  2. There is nonspecific lower extremity edema without evidence for an abscess. Findings are nonspecific but can be seen in patients with cellulitis in the appropriate clinical setting. Other differential considerations include chronic venous insufficiency given the presence of subcutaneous calcifications.  3. No radiographic evidence for osteomyelitis.  4. Aortic Atherosclerosis   CT FEMUR RIGHT W CONTRAST 03/05/2020 IMPRESSION:  1. No acute osseous abnormality. 2. There is nonspecific lower extremity edema without evidence for an abscess. Findings are nonspecific but can be seen in patients with cellulitis in the appropriate clinical setting. Other differential considerations include chronic venous insufficiency given the presence of subcutaneous calcifications.  3. No radiographic evidence for osteomyelitis.  4.  Aortic Atherosclerosis   Karle Plumber, Medical Student 02/13/2020, 9:43 AM Acting Intern, [place] Intern pager: [phone], text pages welcome   I have personally seen and examined this patient and agree with the above note. The following is my additional documentation.   S:  Patient resting comfortably on stretcher in ED.  Remains on 4L oxygen. Reports no worsening SOB. Complains of lower extremity pain ongoing for months. She would like for [place] to "fix" her legs.  No other concerns voiced  Physical Exam: General: Alert and oriented, no apparent distress.  VSS  Eyes: PEERLA ENTM: No pharyengeal erythema Neck:  non tender, no JVD appreciated Cardiovascular: RRR with no murmurs noted Respiratory: CTA bilaterally, no IWOB, no wheezing or crackles heard, pulses present  Gastrointestinal: Bowel sounds present. No abdominal pain  Extremities: upper thighs indurated from chronic calciphylaxis, no lower extremity edema Psych: Behavior and speech appropriate to situation  Carollee Leitz MD. 02/13/2020, 9:43 AM PGY-2, [facility]

## 2020-02-13 NOTE — Procedures (Signed)
Central Venous Catheter Insertion Procedure Note  RONELLA PLUNK  [id number]  [birth year]  Date:02/13/20  Time:5:55 PM   Provider Performing:Ameir Faria Loletha Grayer Tamala Julian   Procedure: Insertion of Non-tunneled Central Venous [phone]) with US guidance (76734)   Indication(s) Hemodialysis  Consent Risks of the procedure as well as the alternatives and risks of each were explained to the patient and/or caregiver.  Consent for the procedure was obtained and is signed in the bedside chart  Anesthesia Topical only with 1% lidocaine   Timeout Verified patient identification, verified procedure, site/side was marked, verified correct patient position, special equipment/implants available, medications/allergies/relevant history reviewed, required imaging and test results available.  Sterile Technique Maximal sterile technique including full sterile barrier drape, hand hygiene, sterile gown, sterile gloves, mask, hair covering, sterile ultrasound probe cover (if used).  Procedure Description Area of catheter insertion was cleaned with chlorhexidine and draped in sterile fashion.  With real-time ultrasound guidance a HD catheter was placed into the right femoral vein. Nonpulsatile blood flow and easy flushing noted in all ports.  The catheter was sutured in place and sterile dressing applied.  Complications/Tolerance None; patient tolerated the procedure well. Chest X-ray is ordered to verify placement for internal jugular or subclavian cannulation.   Chest x-ray is not ordered for femoral cannulation.  EBL Minimal  Specimen(s) None

## 2020-02-13 NOTE — Progress Notes (Signed)
Notified by HD staff that patient was yelling out then became unresponsive and CODE BLUE was called.  She was transferred to ICU and intubated.  Her BP fell so will attempt CRRT after temp HD catheter placed by PCCM.

## 2020-02-13 NOTE — Progress Notes (Signed)
Attending:    Subjective: This is a 60 y/o female with a complex past medical history who was diagnosed with COVID in January, treated with remdesivir x2 days and discharged home on 3L [place] oxygen on 1/23.  Returned on 2/1 complaining of skin pain.  She was hypoxemic in the ER, treated with oxygen.  Since admission she was treated with CPAP at night, did not receive further COVID treatment, pain medicines were given for her skin pain, she was initially given IV antibiotics on admission but then these were held.  Last HD was on 2/1.  On 2/4 she was noted to be more lethargic.   Went to dialysis but coded there for 10 minutes, PCCM consulted  Objective: Vitals:   02/13/20 0743 02/13/20 1115 02/13/20 1158 02/13/20 1500  BP: (!) 85/45 118/61 (!) 150/114 (!) 86/55  Pulse: 88  89   Resp: 19  18 16   Temp: 98 F (36.7 C)  98.1 F (36.7 C) 98 F (36.7 C)  TempSrc: Axillary  Axillary Oral  SpO2: 92%  95%   Weight:      Height:       Vent Mode: PRVC FiO2 (%):  [100 %] 100 % Set Rate:  [12 bmp] 12 bmp Vt Set:  [430 mL] 430 mL PEEP:  [5 cmH20] 5 cmH20  Intake/Output Summary (Last 24 hours) at 02/13/2020 1525 Last data filed at 02/12/2020 1904 Gross per 24 hour  Intake 240 ml  Output --  Net 240 ml    General:  In bed on vent HENT: NCAT ETT in place PULM: CTA B, vent supported breathing CV: RRR, no mgr GI: BS+, soft, nontender MSK: normal bulk and tone Neuro: reaching for tube intermittently, flailing around in bed    CBC    Component Value Date/Time   WBC PENDING 02/13/2020 1505   RBC 3.26 (L) 02/13/2020 1505   HGB 10.6 (L) 02/13/2020 1505   HGB 12.5 11/25/2019 1003   HGB 11.3 (L) 09/11/2013 1138   HCT 35.3 (L) 02/13/2020 1505   HCT 36.3 11/25/2019 1003   HCT 34.7 (L) 09/11/2013 1138   PLT 374 02/13/2020 1505   PLT 391 11/25/2019 1003   MCV 108.3 (H) 02/13/2020 1505   MCV 97 11/25/2019 1003   MCV 83.6 09/11/2013 1138   MCH 32.5 02/13/2020 1505   MCHC 30.0 02/13/2020 1505    RDW 18.9 (H) 02/13/2020 1505   RDW 15.0 11/25/2019 1003   RDW 13.6 09/11/2013 1138   LYMPHSABS PENDING 02/13/2020 1505   LYMPHSABS 4.6 (H) 09/11/2013 1138   MONOABS PENDING 02/13/2020 1505   MONOABS 1.0 (H) 09/11/2013 1138   EOSABS PENDING 02/13/2020 1505   EOSABS 0.3 09/11/2013 1138   BASOSABS PENDING 02/13/2020 1505   BASOSABS 0.0 09/11/2013 1138    BMET    Component Value Date/Time   NA 131 (L) 02/13/2020 0048   NA 142 04/12/2017 0900   NA 141 09/11/2013 1138   K 4.9 02/13/2020 0048   K 3.9 09/11/2013 1138   CL 90 (L) 02/13/2020 0048   CO2 24 02/13/2020 0048   CO2 26 09/11/2013 1138   GLUCOSE 162 (H) 02/13/2020 0048   GLUCOSE 341 (H) 09/11/2013 1138   BUN 48 (H) 02/13/2020 0048   BUN 41 (H) 04/12/2017 0900   BUN 31.4 (H) 09/11/2013 1138   CREATININE 10.71 (H) 02/13/2020 0048   CREATININE 6.19 (H) 12/07/2015 1417   CREATININE 1.7 (H) 09/11/2013 1138   CALCIUM 9.5 02/13/2020 0048   CALCIUM  9.5 09/11/2013 1138   GFRNONAA 4 (L) 02/13/2020 0048   GFRNONAA 24 (L) 10/26/2014 0950   GFRAA 4 (L) 04/25/2018 0956   GFRAA 27 (L) 10/26/2014 0950    CXR images reviewed, bilatearl interstitial opacification  Impression/Plan: Acute respiratory failure with hypoxemia due to inability to protect airway> start mechanical ventilation Septic shock due to HCAP> check respiratory culture, continue zosyn/vanc, will need to renally dose, start levophed, place central line Calcifilaxis> prn pan control  Cardiac arrest> presumably due to septic shock, treat as above  Need for sedation for mechanical ventilation> not following commands but she is making purposeful movements after cardiac arrest, use sedation protocol, RASS -1 to -2, fentanyl, versed, precedex  Pulmonary hypertension> likely due to underlying OSA, monitor hemodynamics, treat shock, consider repeat echo, check coox after CVL in place  Rest per resident note  My cc time 45 minutes  [name], MD [facility]  PCCM Pager: [phone] Cell: [phone] After 3pm or if no response, call [phone]

## 2020-02-13 NOTE — Progress Notes (Signed)
WOC has evaluated patient' right hip wound and agree with diagnosis of unstageable right hip wound with eschar.

## 2020-02-13 NOTE — Progress Notes (Signed)
eLink Physician-Brief Progress Note Patient Name: Rachel Chaney DOB: Jun 12, 1960 MRN: [id number]   Date of Service  02/13/2020  HPI/Events of Note  Patient intubated earlier this evening.  eICU Interventions  CBG checks with SSI changed to Q 4 hours.        [name] 02/13/2020, 8:27 PM

## 2020-02-13 NOTE — Progress Notes (Signed)
[facility] PCCM  Could not place HD cath in R IJ due to central occlusion Has acces in left arm, so would avoid L IJ approach Discussed with renal, no good option for intermittent hemodialysis  Will place femoral HD cath for CRRT  [name], MD [place] PCCM Pager: [phone] Cell: [phone] If no response, call [phone]

## 2020-02-13 NOTE — Progress Notes (Signed)
Per patient's husband via phone call, patient had an episode during a hemodialysis session about 4 months ago or so. He said that the patient had to receive CPR and be "brought back". When asked about her bilateral hand tremor and episodes of falling asleep while talking, her husband said that this all started after the hemodialysis episode. He also said that the patient had trouble swallowing after the episode. I have notified MD of this conversation.

## 2020-02-14 DIAGNOSIS — N186 End stage renal disease: Secondary | ICD-10-CM

## 2020-02-14 DIAGNOSIS — R404 Transient alteration of awareness: Secondary | ICD-10-CM | POA: Diagnosis not present

## 2020-02-14 DIAGNOSIS — J9601 Acute respiratory failure with hypoxia: Secondary | ICD-10-CM | POA: Diagnosis not present

## 2020-02-14 DIAGNOSIS — L0390 Cellulitis, unspecified: Secondary | ICD-10-CM

## 2020-02-14 LAB — APTT: aPTT: 31 seconds (ref 24–36)

## 2020-02-14 LAB — RENAL FUNCTION PANEL
Albumin: 2.1 g/dL — ABNORMAL LOW (ref 3.5–5.0)
Albumin: 1.9 g/dL — ABNORMAL LOW (ref 3.5–5.0)
Anion gap: 19 — ABNORMAL HIGH (ref 5–15)
Anion gap: 14 (ref 5–15)
BUN: 41 mg/dL — ABNORMAL HIGH (ref 8–23)
BUN: 29 mg/dL — ABNORMAL HIGH (ref 8–23)
CO2: 21 mmol/L — ABNORMAL LOW (ref 22–32)
CO2: 20 mmol/L — ABNORMAL LOW (ref 22–32)
Calcium: 9.5 mg/dL (ref 8.9–10.3)
Calcium: 9.1 mg/dL (ref 8.9–10.3)
Chloride: 94 mmol/L — ABNORMAL LOW (ref 98–111)
Chloride: 96 mmol/L — ABNORMAL LOW (ref 98–111)
Creatinine, Ser: 8.3 mg/dL — ABNORMAL HIGH (ref 0.44–1.00)
Creatinine, Ser: 5.77 mg/dL — ABNORMAL HIGH (ref 0.44–1.00)
GFR, Estimated: 5 mL/min — ABNORMAL LOW (ref >60)
GFR, Estimated: 8 mL/min — ABNORMAL LOW (ref >60)
Glucose, Bld: 139 mg/dL — ABNORMAL HIGH (ref 70–99)
Glucose, Bld: 132 mg/dL — ABNORMAL HIGH (ref 70–99)
Phosphorus: 6 mg/dL — ABNORMAL HIGH (ref 2.5–4.6)
Phosphorus: 4.3 mg/dL (ref 2.5–4.6)
Potassium: 5.1 mmol/L (ref 3.5–5.1)
Potassium: 5.4 mmol/L — ABNORMAL HIGH (ref 3.5–5.1)
Sodium: 134 mmol/L — ABNORMAL LOW (ref 135–145)
Sodium: 130 mmol/L — ABNORMAL LOW (ref 135–145)

## 2020-02-14 LAB — POCT ACTIVATED CLOTTING TIME
Activated Clotting Time: 166 seconds
Activated Clotting Time: 166 seconds
Activated Clotting Time: 166 seconds
Activated Clotting Time: 178 seconds
Activated Clotting Time: 178 seconds
Activated Clotting Time: 178 seconds
Activated Clotting Time: 178 seconds
Activated Clotting Time: 184 seconds
Activated Clotting Time: 184 seconds
Activated Clotting Time: 184 seconds
Activated Clotting Time: 190 seconds
Activated Clotting Time: 190 seconds
Activated Clotting Time: 190 seconds
Activated Clotting Time: 196 seconds
Activated Clotting Time: 196 seconds
Activated Clotting Time: 208 seconds

## 2020-02-14 LAB — VANCOMYCIN, RANDOM: Vancomycin Rm: 18

## 2020-02-14 LAB — GLUCOSE, CAPILLARY
Glucose-Capillary: 113 mg/dL — ABNORMAL HIGH (ref 70–99)
Glucose-Capillary: 131 mg/dL — ABNORMAL HIGH (ref 70–99)
Glucose-Capillary: 133 mg/dL — ABNORMAL HIGH (ref 70–99)
Glucose-Capillary: 135 mg/dL — ABNORMAL HIGH (ref 70–99)
Glucose-Capillary: 150 mg/dL — ABNORMAL HIGH (ref 70–99)
Glucose-Capillary: 158 mg/dL — ABNORMAL HIGH (ref 70–99)

## 2020-02-14 LAB — TRIGLYCERIDES: Triglycerides: 105 mg/dL (ref <150)

## 2020-02-14 LAB — MAGNESIUM: Magnesium: 2 mg/dL (ref 1.7–2.4)

## 2020-02-14 MED ORDER — VANCOMYCIN HCL IN DEXTROSE 1-5 GM/200ML-% IV SOLN
1000.0000 mg | INTRAVENOUS | Status: DC
  Administered 2020-02-14 – 2020-02-16 (×3): 1000 mg via INTRAVENOUS
  Filled 2020-02-14 (×3): qty 200

## 2020-02-14 MED ORDER — VANCOMYCIN VARIABLE DOSE PER UNSTABLE RENAL FUNCTION (PHARMACIST DOSING): Status: DC

## 2020-02-14 MED FILL — Medication: Qty: 1 | Status: AC

## 2020-02-14 NOTE — Progress Notes (Signed)
Unable to perform 8am ACT testing due to error with both of the istat machines. Charge, RN aware and is attempting to resolve the issue.

## 2020-02-14 NOTE — Progress Notes (Signed)
Patient ID: Rachel Chaney, female   DOB: 1960/05/05, 60 y.o.   MRN: [id number] S: pt became unresponsive with PEA arrest while in dialysis and code blue called.  ACLS was started and pt intubated and transferred to ICU.  She became hypotensive after intubated and was started on CRRT (after right femoral HD catheter placed by PCCM). O:BP (!) 113/52   Pulse 68   Temp (!) 97.5 F (36.4 C) (Oral)   Resp 18   Ht 5' 4" (1.626 m)   Wt 98.2 kg   LMP 09/18/2011   SpO2 98%   BMI 37.16 kg/m   Intake/Output Summary (Last 24 hours) at 02/14/2020 1040 Last data filed at 02/14/2020 1000 Gross per 24 hour  Intake 1105.71 ml  Output 822 ml  Net 283.71 ml   Intake/Output: I/O last 3 completed shifts: In: 995.2 [P.O.:120; I.V.:609.5; NG/GT:100; IV Piggyback:165.8] Out: 586 [Other:586]  Intake/Output this shift:  Total I/O In: 230.5 [I.V.:46.3; NG/GT:150; IV Piggyback:34.2] Out: 236 [Other:236] Weight change:  Gen: intubated and sedated CVS:RRR Resp: occ rhonchi Abd: obese, +BS, soft, NT/nD Ext: +painful indurations of bilateral thighs, LUE AVF, +T/B  Recent Labs  Lab 02/18/2020 1053 02/23/2020 2045 02/12/20 0430 02/12/20 1432 02/13/20 0048 02/13/20 1505 02/13/20 1527 02/14/20 0232  NA 134* 133* 134* 132* 131* 135 130* 134*  K 3.8 4.1 5.0 4.6 4.9 4.0 3.9 5.1  CL 90*  --  92* 89* 90* 92*  --  94*  CO2 28  --  _0 --  21*  GLUCOSE 110*  --  68* 120* 162* 174*  --  139*  BUN 37*  --  43* 44* 48* 39*  --  41*  CREATININE 8.76*  --  9.46* 10.18* 10.71* 9.25*  --  8.30*  ALBUMIN  --   --  2.1* 2.1* 2.0* 2.1*  --  2.1*  CALCIUM 10.0  --  9.8 10.1 9.5 9.4  --  9.5  PHOS  --   --  8.2* 8.3* 7.6*  --   --  6.0*  AST  --   --   --   --   --  27  --   --   ALT  --   --   --   --   --  18  --   --    Liver Function Tests: Recent Labs  Lab 02/13/20 0048 02/13/20 1505 02/14/20 0232  AST  --  27  --   ALT  --  18  --   ALKPHOS  --  179*  --   BILITOT  --  0.8  --   PROT  --  6.3*   --   ALBUMIN 2.0* 2.1* 2.1*   No results for input(s): LIPASE, AMYLASE in the last 168 hours. No results for input(s): AMMONIA in the last 168 hours. CBC: Recent Labs  Lab 03/06/2020 1053 02/12/2020 2045 02/12/20 0430 02/13/20 0204 02/13/20 1505 02/13/20 1527  WBC 34.6*  --  42.8* 37.8* 60.5*  --   NEUTROABS 29.1*  --  35.2*  --  53.2*  --   HGB 11.3*   < > 10.2* 10.0* 10.6* 10.5*  HCT 38.1   < > 33.2* 32.6* 35.3* 31.0*  MCV 103.5*  --  104.1* 104.5* 108.3*  --   PLT 331  --  327 294 374  --    < > = values in this interval not displayed.   Cardiac Enzymes: No results for input(s):  CKTOTAL, CKMB, CKMBINDEX, TROPONINI in the last 168 hours. CBG: Recent Labs  Lab 02/13/20 1829 02/13/20 1946 02/13/20 2257 02/14/20 0255 02/14/20 0752  GLUCAP 238* 216* 165* 131* 113*    Iron Studies: No results for input(s): IRON, TIBC, TRANSFERRIN, FERRITIN in the last 72 hours. Studies/Results: DG Abd 1 View  Result Date: 02/13/2020 CLINICAL DATA:  60 year old female with enteric tube placement. EXAM: ABDOMEN - 1 VIEW COMPARISON:  CT abdomen pelvis dated 02/25/2020. FINDINGS: Partially visualized enteric tube with tip and side-port in the proximal stomach. No bowel dilatation. Degenerative changes of the spine. The osseous structures are grossly unremarkable. Atherosclerotic calcification of the aorta. IMPRESSION: Enteric tube with tip and side-port in the proximal stomach. Electronically Signed   By: Anner Crete M.D.   On: 02/13/2020 16:09   Portable Chest x-ray  Result Date: 02/13/2020 CLINICAL DATA:  Endotracheal tube EXAM: PORTABLE CHEST 1 VIEW COMPARISON:  02/25/2020 FINDINGS: Endotracheal tube with the tip 1.2 cm above the carina. Recommend retracting the endotracheal tube 2 cm. Nasogastric tube coursing below the diaphragm. Bibasilar airspace disease which may reflect atelectasis versus pneumonia. No pleural effusion or pneumothorax. Heart and mediastinal contours are unremarkable. No  acute osseous abnormality. IMPRESSION: 1. Endotracheal tube with the tip 1.2 cm above the carina. Recommend retracting the endotracheal tube 2 cm. 2. Bibasilar airspace disease which may reflect atelectasis versus pneumonia. Electronically Signed   By: Kathreen Devoid   On: 02/13/2020 15:46   . aspirin  81 mg Per Tube Daily  . atorvastatin  80 mg Per Tube QHS  . chlorhexidine gluconate (MEDLINE KIT)  15 mL Mouth Rinse BID  . Chlorhexidine Gluconate Cloth  6 each Topical Q0600  . docusate  100 mg Per Tube BID  . gabapentin  50 mg Per Tube QHS  . heparin  5,000 Units Subcutaneous Q8H  . insulin aspart  0-6 Units Subcutaneous Q4H  . mouth rinse  15 mL Mouth Rinse 10 times per day  . midodrine  2.5 mg Per Tube TID WC  . polyethylene glycol  17 g Per Tube Daily  . silver sulfADIAZINE   Topical BID  . traMADol  25 mg Per Tube Q12H    BMET    Component Value Date/Time   NA 134 (L) 02/14/2020 0232   NA 142 04/12/2017 0900   NA 141 09/11/2013 1138   K 5.1 02/14/2020 0232   K 3.9 09/11/2013 1138   CL 94 (L) 02/14/2020 0232   CO2 21 (L) 02/14/2020 0232   CO2 26 09/11/2013 1138   GLUCOSE 139 (H) 02/14/2020 0232   GLUCOSE 341 (H) 09/11/2013 1138   BUN 41 (H) 02/14/2020 0232   BUN 41 (H) 04/12/2017 0900   BUN 31.4 (H) 09/11/2013 1138   CREATININE 8.30 (H) 02/14/2020 0232   CREATININE 6.19 (H) 12/07/2015 1417   CREATININE 1.7 (H) 09/11/2013 1138   CALCIUM 9.5 02/14/2020 0232   CALCIUM 9.5 09/11/2013 1138   GFRNONAA 5 (L) 02/14/2020 0232   GFRNONAA 24 (L) 10/26/2014 0950   GFRAA 4 (L) 04/25/2018 0956   GFRAA 27 (L) 10/26/2014 0950   CBC    Component Value Date/Time   WBC 60.5 (HH) 02/13/2020 1505   RBC 3.26 (L) 02/13/2020 1505   HGB 10.5 (L) 02/13/2020 1527   HGB 12.5 11/25/2019 1003   HGB 11.3 (L) 09/11/2013 1138   HCT 31.0 (L) 02/13/2020 1527   HCT 36.3 11/25/2019 1003   HCT 34.7 (L) 09/11/2013 1138   PLT 374 02/13/2020  1505   PLT 391 11/25/2019 1003   MCV 108.3 (H) 02/13/2020  1505   MCV 97 11/25/2019 1003   MCV 83.6 09/11/2013 1138   MCH 32.5 02/13/2020 1505   MCHC 30.0 02/13/2020 1505   RDW 18.9 (H) 02/13/2020 1505   RDW 15.0 11/25/2019 1003   RDW 13.6 09/11/2013 1138   LYMPHSABS 3.6 02/13/2020 1505   LYMPHSABS 4.6 (H) 09/11/2013 1138   MONOABS 2.4 (H) 02/13/2020 1505   MONOABS 1.0 (H) 09/11/2013 1138   EOSABS 0.0 02/13/2020 1505   EOSABS 0.3 09/11/2013 1138   BASOSABS 0.0 02/13/2020 1505   BASOSABS 0.0 09/11/2013 1138     Dialysis Orders: MWF @ [place] 3:30hr, 450/800, EDW 97kg, 2K/2Ca, UFP #4, AVF, heparin 8000u bolus - Sensipar 30 mg PO q HD - Hectoral 56mg IV q HD - Mircera 100 q 2 weeks - ordered, not yet given, Hgb 10.8 with tsat 9 on 01/28/20  Assessment/Plan: 1. PEA Arrest - felt to be due to sepsis.  currently on levophed, midodrine, zosyn, and vancomycin per PCCM. 2. Altered Mental Status - apparently started yesterday afternoon.  She did receive tramadol as well as gabapentin, however she remains confused again today.  Was also found to have multiple hypotensive events and thought this could be related to her AMS.  Workup per primary svc.  Has also had multiple episodes of hypoglycemia. 3. Acute hypoxic respiratory failure due to covid-19 pneumonia - currently intubated in ICU.  Already given IV remdesivir at last admission. 4.  ESRD -  currently on CRRT and follow electrolytes. 5.  Hypertension/volume  - will start to UF 50-100 ml/hr with CRRT as tolerated.  Continue with midodrine and levophed for now and follow.  6.  Anemia  - Hgb stable 7.  Metabolic bone disease - npo for now 8.  Nutrition - renal, carb modified 9. Calciphylaxis- of bilateral thighs, chronic but possible source of infection especially with unstageable right hip wound   JDonetta Potts MD [phone]

## 2020-02-14 NOTE — Progress Notes (Signed)
Rachel Chaney, MRN:  [id number], DOB:  1960-04-23, LOS: 2 ADMISSION DATE:  02/23/2020, CONSULTATION DATE:  02/28/2020 REFERRING MD:  FMTS, CHIEF COMPLAINT:  PEA arrest    Brief History:  Rachel Chaney is a72 y.o. female with a pPMH of ESRD on HD (MWF), Chronic hypoxic respiratory failure, T2DM, HFpEF, HTN, HLD, CAD s/p MI (2020) who presented to [facility] with chronic nonspecific skin pain over her bilateral lower extremities and her hospitalization has been complicated by acute on chronic hypoxic respiratory failure and hypotension.   History of Present Illness:  Rachel Chaney is a27 y.o. female with a pPMH of ESRD on HD (MWF), Chronic hypoxic respiratory failure, T2DM, HFpEF, HTN, HLD, CAD s/p MI (2020) who presented to [facility] with chronic nonspecific skin pain over her bilateral lower extremities and her hospitalization has been complicated by acute on chronic hypoxic respiratory failure and hypotension.   Patient was recently diagnosed with COVID on 01/2020 and received only two dose of Remdesivir and was discharged with 3L [place] supplemental oxygen at home. She presents to the ED 02/02 with worsening chronic lower extremity pain and was found to be hypoxic at 64% on room air. CXR was positive for patchy multifocal infiltrates consistent with COVID-19 pneumonia. Patient developed acute hypotension in the 80/40s after returning from CT scan with concern for medication side effect from IV dilaudid. ABG was performed showing mixed respiratory and AGMA with hypercarbia.She was started on CPAP q HS. During the subsequent day, the patient was noted to be less responsive with confusions and disorientation. Patient was noted to have a fever of 100.9 on arrival but no hyperthermia since. She had a leukocytosis with left shift, which is chronic for her and hypotension concerning for infectious. Therefore, blood culture were drawn and empiric vancomycin and zosyn was started. Code blue was call during HD session  today due to PEA arrest and she was transferred to the ICU  Past Medical History:  ESRD  Type II diabetes  HLD HTN Cardiac arrest  CHF  Significant Hospital Events:  02/02 - Admitted for leg pain found hypoxic and hypotensive.  02/04 - PEA arrest  Consults:  Nephrology  Cardiology  Procedures:    Significant Diagnostic Tests:  01/23 Echo > 60 to 65%  02/02 CXR > Patchy BILATERAL pulmonary infiltrates favor multifocal pneumonia over pulmonary edema.  02/02 CT Abdomen/Pelvis > No acute abnormality. Multiple chronic findings as detailed above, not substantially changed from recent prior study.  02/02 CT left and right femur > No acute osseous abnormality. There is nonspecific lower extremity edema without evidence for an abscess. Findings are nonspecific but can be seen in patients with cellulitis in the appropriate clinical setting. Other differential considerations include chronic venous insufficiency given the presence of subcutaneous calcifications.  Micro Data:  MRSA PCR 2/4 > Blood culture 2/4 > Respiratory 2/4 >  Antimicrobials:  Zosyn 2/4 > Vancomycin 2/2 >  Interim History / Subjective:  No acute issues overnight  Remains on CRRT  Objective   Blood pressure (!) 129/56, pulse 66, temperature (!) 97.5 F (36.4 C), temperature source Oral, resp. rate 18, height 5\' 4"  (1.626 m), weight 98.2 kg, last menstrual period 09/18/2011, SpO2 98 %.    Vent Mode: PRVC FiO2 (%):  [30 %-100 %] 30 % Set Rate:  [12 bmp-18 bmp] 18 bmp Vt Set:  [430 mL] 430 mL PEEP:  [5 cmH20-8 cmH20] 8 cmH20 Plateau Pressure:  [21 cmH20-23 cmH20] 22 cmH20   Intake/Output Summary (  Last 24 hours) at 02/14/2020 1138 Last data filed at 02/14/2020 1100 Gross per 24 hour  Intake 1286.04 ml  Output 1069 ml  Net 217.04 ml   Filed Weights   03/06/2020 1724 02/13/20 1305 02/14/20 0227  Weight: 81.6 kg 95.2 kg 98.2 kg    Examination: General: Chronically ill appearing elderly female/female on  mechanical ventilation, in NAD HEENT: ETT, MM pink/moist, PERRL,  Neuro: Will open eyes to verbal stimuli CV: s1s2 regular rate and rhythm, no murmur, rubs, or gallops,  PULM:  Diminished bilaterally, tolerating vent well, no increased work of breathing on vent  GI: soft, bowel sounds active in all 4 quadrants, non-tender, non-distended, tolerating TF Extremities: warm/dry, no edema  Skin: no rashes or lesions  Resolved Hospital Problem list     Assessment & Plan:  PEA Arrest presumably due to Septic Shock Circulatory shock P: Continue pressor support  Treatment for sepsis as below  Continuous telemetry  Trend lactic   Acute Hypoxic Respiratory Failure  HCAP P: Continue ventilator support with lung protective strategies  Wean PEEP and FiO2 for sats greater than 90%. Head of bed elevated 30 degrees. Plateau pressures less than 30 cm H20.  Follow intermittent chest x-ray and ABG.   SAT/SBT as tolerated, mentation preclude extubation  Ensure adequate pulmonary hygiene  Follow cultures  VAP bundle in place  PAD protocol Continue broad spectrum antibiotics   Severe septic shock -Patient is hypotensive, tachypnic, with significantly elevated leukocytosis concerning for sepsis.  P: Vent support as above Follow pan culture  Continue IV antibiotics  Pressor support for MAP goal > 65 Trend lactic acid Procalcitonin 92.99 Monitor urine output  At risk for anoxic encephalopathy. P: Minimize sedation as able  Delirium precausi  ESRD on HD P: Nephrology following, appreciate assistance Follow renal function / urine output Trend Bmet Avoid nephrotoxins Ensure adequate renal perfusion   T2DM P: SSI  CBG q4   Best practice (evaluated daily)  Diet: NPO Pain/Anxiety/Delirium protocol (if indicated): As needed  VAP protocol (if indicated): In place  DVT prophylaxis: Heparin  GI prophylaxis: PPI Glucose control: SSI Mobility: Bedrest Disposition:ICU  Goals of  Care:  Last date of multidisciplinary goals of care discussion:Pending  Family and staff present: Pending  Summary of discussion: Pending Follow up goals of care discussion due: Pending Code Status: Full   Labs   CBC: Recent Labs  Lab 02/14/2020 1053 02/15/2020 2045 02/12/20 0430 02/13/20 0204 02/13/20 1505 02/13/20 1527  WBC 34.6*  --  42.8* 37.8* 60.5*  --   NEUTROABS 29.1*  --  35.2*  --  53.2*  --   HGB 11.3* 11.9* 10.2* 10.0* 10.6* 10.5*  HCT 38.1 35.0* 33.2* 32.6* 35.3* 31.0*  MCV 103.5*  --  104.1* 104.5* 108.3*  --   PLT 331  --  327 294 374  --     Basic Metabolic Panel: Recent Labs  Lab 02/12/20 0430 02/12/20 1432 02/13/20 0048 02/13/20 1505 02/13/20 1527 02/14/20 0232  NA 134* 132* 131* 135 130* 134*  K 5.0 4.6 4.9 4.0 3.9 5.1  CL 92* 89* 90* 92*  --  94*  CO2 24 26 24 24   --  21*  GLUCOSE 68* 120* 162* 174*  --  139*  BUN 43* 44* 48* 39*  --  41*  CREATININE 9.46* 10.18* 10.71* 9.25*  --  8.30*  CALCIUM 9.8 10.1 9.5 9.4  --  9.5  MG  --  1.9 1.9  --   --  2.0  PHOS 8.2* 8.3* 7.6*  --   --  6.0*   GFR: Estimated Creatinine Clearance: 8.1 mL/min (A) (by C-G formula based on SCr of 8.3 mg/dL (H)). Recent Labs  Lab 03/08/2020 1053 02/29/2020 1657 02/12/20 0430 02/13/20 0204 02/13/20 1505 02/13/20 1826 02/13/20 2116  PROCALCITON  --   --   --   --   --  92.99  --   WBC 34.6*  --  42.8* 37.8* 60.5*  --   --   LATICACIDVEN  --  1.6  --   --  5.8*  --  2.1*    Liver Function Tests: Recent Labs  Lab 02/12/20 0430 02/12/20 1432 02/13/20 0048 02/13/20 1505 02/14/20 0232  AST  --   --   --  27  --   ALT  --   --   --  18  --   ALKPHOS  --   --   --  179*  --   BILITOT  --   --   --  0.8  --   PROT  --   --   --  6.3*  --   ALBUMIN 2.1* 2.1* 2.0* 2.1* 2.1*   No results for input(s): LIPASE, AMYLASE in the last 168 hours. No results for input(s): AMMONIA in the last 168 hours.  ABG    Component Value Date/Time   PHART 7.415 02/13/2020 1527    PCO2ART 39.9 02/13/2020 1527   PO2ART 173 (H) 02/13/2020 1527   HCO3 25.7 02/13/2020 1527   TCO2 27 02/13/2020 1527   ACIDBASEDEF 2.1 (H) 11/12/2019 0125   O2SAT 100.0 02/13/2020 1527     Coagulation Profile: No results for input(s): INR, PROTIME in the last 168 hours.  Cardiac Enzymes: No results for input(s): CKTOTAL, CKMB, CKMBINDEX, TROPONINI in the last 168 hours.  HbA1C: HbA1c, POC (controlled diabetic range)  Date/Time Value Ref Range Status  03/11/2019 10:20 AM 7.7 (A) 0.0 - 7.0 % Final  08/23/2017 08:43 AM 9.1 (A) 0.0 - 7.0 % Final   Hgb A1c MFr Bld  Date/Time Value Ref Range Status  02/12/2020 04:30 AM 5.5 4.8 - 5.6 % Final    Comment:    (NOTE) Pre diabetes:          5.7%-6.4%  Diabetes:              >6.4%  Glycemic control for   <7.0% adults with diabetes   11/12/2019 05:10 AM 9.0 (H) 4.8 - 5.6 % Final    Comment:    (NOTE) Pre diabetes:          5.7%-6.4%  Diabetes:              >6.4%  Glycemic control for   <7.0% adults with diabetes     CBG: Recent Labs  Lab 02/13/20 1829 02/13/20 1946 02/13/20 2257 02/14/20 0255 02/14/20 0752  GLUCAP 238* 216* 165* 131* 113*    Critical care time:   CRITICAL CARE Performed by: Johnsie Cancel  Total critical care time: 39 minutes  Critical care time was exclusive of separately billable procedures and treating other patients.  Critical care was necessary to treat or prevent imminent or life-threatening deterioration.  Critical care was time spent personally by me on the following activities: development of treatment plan with patient and/or surrogate as well as nursing, discussions with consultants, evaluation of patient's response to treatment, examination of patient, obtaining history from patient or surrogate, ordering and performing treatments and interventions, ordering  and review of laboratory studies, ordering and review of radiographic studies, pulse oximetry and re-evaluation of patient's  condition.  Johnsie Cancel, NP-C [facility] Personal contact information can be found on Amion  If no response please page: Adult pulmonary and critical care medicine pager on Amion unitl 7pm After 7pm please call [phone] 02/14/2020, 12:37 PM

## 2020-02-14 NOTE — Progress Notes (Signed)
.   Pharmacy Antibiotic Note  Rachel Chaney is a 60 y.o. female admitted on 02/18/2020 with sepsis.   Patient on CRRT. Vanc random this am 18 - will redose.  Height: 5\' 4"  (162.6 cm) Weight: 98.2 kg (216 lb 7.9 oz) IBW/kg (Calculated) : 54.7  Temp (24hrs), Avg:98 F (36.7 C), Min:97.5 F (36.4 C), Max:98.5 F (36.9 C)  Recent Labs  Lab 03/07/2020 1053 02/13/2020 1657 02/12/20 0430 02/12/20 1432 02/13/20 0048 02/13/20 0204 02/13/20 1505 02/13/20 2116 02/14/20 0232 02/14/20 0233  WBC 34.6*  --  42.8*  --   --  37.8* 60.5*  --   --   --   CREATININE 8.76*  --  9.46* 10.18* 10.71*  --  9.25*  --  8.30*  --   LATICACIDVEN  --  1.6  --   --   --   --  5.8* 2.1*  --   --   VANCORANDOM  --   --   --   --   --   --   --   --   --  18    Estimated Creatinine Clearance: 8.1 mL/min (A) (by C-G formula based on SCr of 8.3 mg/dL (H)).    No Known Allergies  Plan: Start Vanc 1000 mg IV q24hr while on CRRT Monitor changes in dialysis, clinical status and C&S  Alanda Slim, PharmD, [facility] Clinical Pharmacist Please see AMION for all Pharmacists' Contact Phone Numbers 02/14/2020, 7:54 AM

## 2020-02-15 DIAGNOSIS — J9601 Acute respiratory failure with hypoxia: Secondary | ICD-10-CM | POA: Diagnosis not present

## 2020-02-15 DIAGNOSIS — U071 COVID-19: Secondary | ICD-10-CM

## 2020-02-15 DIAGNOSIS — Z992 Dependence on renal dialysis: Secondary | ICD-10-CM

## 2020-02-15 LAB — POCT ACTIVATED CLOTTING TIME
Activated Clotting Time: 184 seconds
Activated Clotting Time: 190 seconds
Activated Clotting Time: 196 seconds
Activated Clotting Time: 196 seconds
Activated Clotting Time: 184 seconds
Activated Clotting Time: 190 seconds
Activated Clotting Time: 190 seconds
Activated Clotting Time: 190 seconds
Activated Clotting Time: 196 seconds

## 2020-02-15 LAB — RENAL FUNCTION PANEL
Albumin: 2 g/dL — ABNORMAL LOW (ref 3.5–5.0)
Albumin: 1.9 g/dL — ABNORMAL LOW (ref 3.5–5.0)
Anion gap: 14 (ref 5–15)
Anion gap: 13 (ref 5–15)
BUN: 25 mg/dL — ABNORMAL HIGH (ref 8–23)
BUN: 20 mg/dL (ref 8–23)
CO2: 23 mmol/L (ref 22–32)
CO2: 23 mmol/L (ref 22–32)
Calcium: 9.6 mg/dL (ref 8.9–10.3)
Calcium: 9.9 mg/dL (ref 8.9–10.3)
Chloride: 97 mmol/L — ABNORMAL LOW (ref 98–111)
Chloride: 99 mmol/L (ref 98–111)
Creatinine, Ser: 4.52 mg/dL — ABNORMAL HIGH (ref 0.44–1.00)
Creatinine, Ser: 3.47 mg/dL — ABNORMAL HIGH (ref 0.44–1.00)
GFR, Estimated: 10 mL/min — ABNORMAL LOW (ref >60)
GFR, Estimated: 14 mL/min — ABNORMAL LOW (ref >60)
Glucose, Bld: 152 mg/dL — ABNORMAL HIGH (ref 70–99)
Glucose, Bld: 172 mg/dL — ABNORMAL HIGH (ref 70–99)
Phosphorus: 3.8 mg/dL (ref 2.5–4.6)
Phosphorus: 3.9 mg/dL (ref 2.5–4.6)
Potassium: 5 mmol/L (ref 3.5–5.1)
Potassium: 4.9 mmol/L (ref 3.5–5.1)
Sodium: 134 mmol/L — ABNORMAL LOW (ref 135–145)
Sodium: 135 mmol/L (ref 135–145)

## 2020-02-15 LAB — GLUCOSE, CAPILLARY
Glucose-Capillary: 145 mg/dL — ABNORMAL HIGH (ref 70–99)
Glucose-Capillary: 157 mg/dL — ABNORMAL HIGH (ref 70–99)
Glucose-Capillary: 161 mg/dL — ABNORMAL HIGH (ref 70–99)
Glucose-Capillary: 169 mg/dL — ABNORMAL HIGH (ref 70–99)
Glucose-Capillary: 169 mg/dL — ABNORMAL HIGH (ref 70–99)
Glucose-Capillary: 184 mg/dL — ABNORMAL HIGH (ref 70–99)
Glucose-Capillary: 185 mg/dL — ABNORMAL HIGH (ref 70–99)

## 2020-02-15 LAB — APTT: aPTT: 105 seconds — ABNORMAL HIGH (ref 24–36)

## 2020-02-15 LAB — MAGNESIUM: Magnesium: 2.2 mg/dL (ref 1.7–2.4)

## 2020-02-15 MED ORDER — BISACODYL 10 MG RE SUPP: 10.0000 mg | Freq: Every day | RECTAL | Status: DC | PRN

## 2020-02-15 NOTE — Progress Notes (Addendum)
Rachel Chaney, MRN:  [id number], DOB:  1960/09/07, LOS: 3 ADMISSION DATE:  02/25/2020, CONSULTATION DATE:  03/02/2020 REFERRING MD:  FMTS, CHIEF COMPLAINT:  PEA arrest    Brief History:  Rachel Chaney is a60 y.o. female with a pPMH of ESRD on HD (MWF), Chronic hypoxic respiratory failure, T2DM, HFpEF, HTN, HLD, CAD s/p MI (2020) who presented to [facility] with chronic nonspecific skin pain over her bilateral lower extremities and her hospitalization has been complicated by acute on chronic hypoxic respiratory failure and hypotension.   History of Present Illness:  Rachel Chaney is a60 y.o. female with a pPMH of ESRD on HD (MWF), Chronic hypoxic respiratory failure, T2DM, HFpEF, HTN, HLD, CAD s/p MI (2020) who presented to [facility] [place] with chronic nonspecific skin pain over her bilateral lower extremities and her hospitalization has been complicated by acute on chronic hypoxic respiratory failure and hypotension.   Patient was recently diagnosed with COVID on 01/2020 and received only two dose of Remdesivir and was discharged with 3L [place] supplemental oxygen at home. She presents to the ED 02/02 with worsening chronic lower extremity pain and was found to be hypoxic at 64% on room air. CXR was positive for patchy multifocal infiltrates consistent with COVID-19 pneumonia. Patient developed acute hypotension in the 80/40s after returning from CT scan with concern for medication side effect from IV dilaudid. ABG was performed showing mixed respiratory and AGMA with hypercarbia.She was started on CPAP q HS. During the subsequent day, the patient was noted to be less responsive with confusions and disorientation. Patient was noted to have a fever of 100.9 on arrival but no hyperthermia since. She had a leukocytosis with left shift, which is chronic for her and hypotension concerning for infectious. Therefore, blood culture were drawn and empiric vancomycin and zosyn was started. Code blue was call during HD session  today due to PEA arrest and she was transferred to the ICU  Past Medical History:  ESRD  Type II diabetes  HLD HTN Cardiac arrest  CHF  Significant Hospital Events:  02/02 - Admitted for leg pain found hypoxic and hypotensive.  02/04 - PEA arrest  Consults:  Nephrology  Cardiology  Procedures:    Significant Diagnostic Tests:  01/23 Echo > 60 to 65%  02/02 CXR > Patchy BILATERAL pulmonary infiltrates favor multifocal pneumonia over pulmonary edema.  02/02 CT Abdomen/Pelvis > No acute abnormality. Multiple chronic findings as detailed above, not substantially changed from recent prior study.  02/02 CT left and right femur > No acute osseous abnormality. There is nonspecific lower extremity edema without evidence for an abscess. Findings are nonspecific but can be seen in patients with cellulitis in the appropriate clinical setting. Other differential considerations include chronic venous insufficiency given the presence of subcutaneous calcifications.  Micro Data:  MRSA PCR 2/4 > Blood culture 2/4 > Respiratory 2/4 >  Antimicrobials:  Zosyn 2/4 > Vancomycin 2/2 >  Interim History / Subjective:  Seen lying in bed on mechanical ventilation with sedation in no acute distress Remains on CRRT Intermittent issues with hypotension overnight  Objective   Blood pressure (!) 104/54, pulse 63, temperature (!) 95.1 F (35.1 C), resp. rate (!) 0, height 5\' 4"  (1.626 m), weight 93.6 kg, last menstrual period 09/18/2011, SpO2 99 %.    Vent Mode: PRVC FiO2 (%):  [30 %] 30 % Set Rate:  [18 bmp] 18 bmp Vt Set:  [430 mL] 430 mL PEEP:  [5 cmH20-8 cmH20] 5 cmH20 Plateau Pressure:  [  18 cmH20-22 cmH20] 21 cmH20   Intake/Output Summary (Last 24 hours) at 02/15/2020 0729 Last data filed at 02/15/2020 0700 Gross per 24 hour  Intake 1058.78 ml  Output 3317 ml  Net -2258.22 ml   Filed Weights   02/13/20 1305 02/14/20 0227 02/15/20 0434  Weight: 95.2 kg 98.2 kg 93.6 kg     Examination: General: Acute on chronic ill-appearing middle-aged female lying in bed on mechanical ventilation in no acute distress HEENT: ETT, MM pink/moist, PERRL,  Neuro: We will attempt to open eyes to verbal stimuli, currently sedated on ventilator CV: s1s2 regular rate and rhythm, no murmur, rubs, or gallops,  PULM: Difficulty falling, will additional breath sounds, tolerating ventilator well GI: soft, bowel sounds active in all 4 quadrants, non-tender, non-distended, tolerating tube feeds Extremities: warm/dry, no edema  Skin: no rashes or lesions  Resolved Hospital Problem list     Assessment & Plan:  PEA Arrest presumably due to Septic Shock Circulatory shock P: Intermittently requiring very low doses of peripheral Levophed Continuous telemetry Continue treatment for sepsis as below Trend lactic acid Supportive care  Acute Hypoxic Respiratory Failure  HCAP P: Continue ventilator support with lung protective strategies Wean PEEP and FiO2 for sats greater than 90% Daily SBT trials, mentation precludes extubation Head of bed elevated at 30 degrees Plateau pressures less than 30 Follow intermittent chest x-ray and ABG Follow cultures Vent bundle in place Pad protocol Remains on broad-spectrum antibiotics  Severe septic shock -Patient is hypotensive, tachypnic, with significantly elevated leukocytosis concerning for sepsis.  P: Vent support as above Continue to follow culture results Continue antibiotics as above Monitor urine output Supportive care  At risk for anoxic encephalopathy. P: Minimize sedation as able Delirium precautions  ESRD on HD P: Nephrology following, appreciate assistance Follow renal function/urine output Trend Bmet Avoid nephrology Ensure adequate renal perfusion  T2DM P: SSi  CBG q4hr   Best practice (evaluated daily)  Diet: NPO Pain/Anxiety/Delirium protocol (if indicated): As needed  VAP protocol (if indicated): In  place  DVT prophylaxis: Heparin  GI prophylaxis: PPI Glucose control: SSI Mobility: Bedrest Disposition:ICU  Goals of Care:  Last date of multidisciplinary goals of care discussion:Pending  Family and staff present: Pending  Summary of discussion: Pending Follow up goals of care discussion due: Pending Code Status: Full   Labs   CBC: Recent Labs  Lab 02/22/2020 1053 02/23/2020 2045 02/12/20 0430 02/13/20 0204 02/13/20 1505 02/13/20 1527  WBC 34.6*  --  42.8* 37.8* 60.5*  --   NEUTROABS 29.1*  --  35.2*  --  53.2*  --   HGB 11.3* 11.9* 10.2* 10.0* 10.6* 10.5*  HCT 38.1 35.0* 33.2* 32.6* 35.3* 31.0*  MCV 103.5*  --  104.1* 104.5* 108.3*  --   PLT 331  --  327 294 374  --     Basic Metabolic Panel: Recent Labs  Lab 02/12/20 1432 02/13/20 0048 02/13/20 1505 02/13/20 1527 02/14/20 0232 02/14/20 1619 02/15/20 0428  NA 132* 131* 135 130* 134* 130* 134*  K 4.6 4.9 4.0 3.9 5.1 5.4* 5.0  CL 89* 90* 92*  --  94* 96* 97*  CO2 26 24 24   --  21* 20* 23  GLUCOSE 120* 162* 174*  --  139* 132* 152*  BUN 44* 48* 39*  --  41* 29* 25*  CREATININE 10.18* 10.71* 9.25*  --  8.30* 5.77* 4.52*  CALCIUM 10.1 9.5 9.4  --  9.5 9.1 9.6  MG 1.9 1.9  --   --  2.0  --  2.2  PHOS 8.3* 7.6*  --   --  6.0* 4.3 3.8   GFR: Estimated Creatinine Clearance: 14.5 mL/min (A) (by C-G formula based on SCr of 4.52 mg/dL (H)). Recent Labs  Lab 02/17/2020 1053 02/14/2020 1657 02/12/20 0430 02/13/20 0204 02/13/20 1505 02/13/20 1826 02/13/20 2116  PROCALCITON  --   --   --   --   --  92.99  --   WBC 34.6*  --  42.8* 37.8* 60.5*  --   --   LATICACIDVEN  --  1.6  --   --  5.8*  --  2.1*    Liver Function Tests: Recent Labs  Lab 02/13/20 0048 02/13/20 1505 02/14/20 0232 02/14/20 1619 02/15/20 0428  AST  --  27  --   --   --   ALT  --  18  --   --   --   ALKPHOS  --  179*  --   --   --   BILITOT  --  0.8  --   --   --   PROT  --  6.3*  --   --   --   ALBUMIN 2.0* 2.1* 2.1* 1.9* 2.0*   No  results for input(s): LIPASE, AMYLASE in the last 168 hours. No results for input(s): AMMONIA in the last 168 hours.  ABG    Component Value Date/Time   PHART 7.415 02/13/2020 1527   PCO2ART 39.9 02/13/2020 1527   PO2ART 173 (H) 02/13/2020 1527   HCO3 25.7 02/13/2020 1527   TCO2 27 02/13/2020 1527   ACIDBASEDEF 2.1 (H) 11/12/2019 0125   O2SAT 100.0 02/13/2020 1527     Coagulation Profile: No results for input(s): INR, PROTIME in the last 168 hours.  Cardiac Enzymes: No results for input(s): CKTOTAL, CKMB, CKMBINDEX, TROPONINI in the last 168 hours.  HbA1C: HbA1c, POC (controlled diabetic range)  Date/Time Value Ref Range Status  03/11/2019 10:20 AM 7.7 (A) 0.0 - 7.0 % Final  08/23/2017 08:43 AM 9.1 (A) 0.0 - 7.0 % Final   Hgb A1c MFr Bld  Date/Time Value Ref Range Status  02/12/2020 04:30 AM 5.5 4.8 - 5.6 % Final    Comment:    (NOTE) Pre diabetes:          5.7%-6.4%  Diabetes:              >6.4%  Glycemic control for   <7.0% adults with diabetes   11/12/2019 05:10 AM 9.0 (H) 4.8 - 5.6 % Final    Comment:    (NOTE) Pre diabetes:          5.7%-6.4%  Diabetes:              >6.4%  Glycemic control for   <7.0% adults with diabetes     CBG: Recent Labs  Lab 02/14/20 1205 02/14/20 1527 02/14/20 2004 02/14/20 2340 02/15/20 0447  GLUCAP 135* 133* 158* 150* 145*    Critical care time:   CRITICAL CARE Performed by: Johnsie Cancel  Total critical care time: 38 minutes  Critical care time was exclusive of separately billable procedures and treating other patients.  Critical care was necessary to treat or prevent imminent or life-threatening deterioration.  Critical care was time spent personally by me on the following activities: development of treatment plan with patient and/or surrogate as well as nursing, discussions with consultants, evaluation of patient's response to treatment, examination of patient, obtaining history from patient or surrogate,  ordering  and performing treatments and interventions, ordering and review of laboratory studies, ordering and review of radiographic studies, pulse oximetry and re-evaluation of patient's condition.  Johnsie Cancel, NP-C [facility] Personal contact information can be found on Amion  If no response please page: Adult pulmonary and critical care medicine pager on Amion unitl 7pm After 7pm please call [phone] 02/15/2020, 7:29 AM   PCCM:    60 yo FM, COVID19 PNA, MODS, Intubated on life support and CVVHD, ESRD on iHD in OP MWF, Chronic resp failure at baseline, DMII and HF.  Discussed with Dr. Loletha Grayer from Nephrology. 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 In the thighs and hips   BP (!) 138/59   Pulse 80   Temp (!) 96.7 F (35.9 C) (Axillary)   Resp 18   Ht 5\' 4"  (1.626 m)   Wt 93.6 kg   LMP 09/18/2011   SpO2 99%   BMI 35.42 kg/m   Gen: chronically ill appearing fm, intubated on life support HENT: ett in place  Heart: RRR, s1 s2  Lungs: BL vented breaths  Abd: obese soft, nt   Labs reviewed: on Cvvhd   A:  AHRF COVID19 PNA  PEA Cardiac arrest, at risk for anoxic brain injury  HCAP  Septic Shock 2/2 to above  Acute on chronic renal failure ESRD on iHD now needing CVVHD   P: ARDS vent management protocol  Wean from sedatation as tolerated  Still needing cvvhd  Concern for anoxic encephalopathy  I think her prognosis is overall poor with her multiple medical problems  I will consult pallative care to see  Continue ABx  Pressors to maintain MAP >38mmhg   This patient is critically ill with multiple organ system failure; which, requires frequent high complexity decision making, assessment, support, evaluation, and titration of therapies. This was completed through the application of advanced monitoring technologies and extensive interpretation of multiple databases. During this encounter critical care time was devoted to patient care services  described in this note for 32 minutes.  Garner Nash, DO [facility] 02/15/2020 11:28 AM

## 2020-02-15 NOTE — Progress Notes (Signed)
Patient ID: Rachel Chaney, female   DOB: [birth year], 60 y.o.   MRN: [id number] S:No events overnight and tolerating CRRT with 2 liters UF  O:BP (!) 138/59   Pulse 80   Temp (!) 96.7 F (35.9 C) (Axillary)   Resp 18   Ht _0  (1.626 m)   Wt 93.6 kg   LMP 09/18/2011   SpO2 99%   BMI 35.42 kg/m   Intake/Output Summary (Last 24 hours) at 02/15/2020 1139 Last data filed at 02/15/2020 1123 Gross per 24 hour  Intake 1024.13 ml  Output 3325 ml  Net -2300.87 ml   Intake/Output: I/O last 3 completed shifts: In: 1934 [I.V.:960.5; NG/GT:370; IV Piggyback:603.5] Out: 3983 [Emesis/NG output:350; ZTIWP:8099]  Intake/Output this shift:  Total I/O In: 376.2 [I.V.:59.6; NG/GT:115; IV Piggyback:201.6] Out: 491 [Other:491] Weight change: -1.6 kg Physical exam: unable to complete due to COVID + status.  In order to preserve PPE equipment and to minimize exposure to providers.  Notes from other caregivers reviewed   Recent Labs  Lab 02/12/20 0430 02/12/20 1432 02/13/20 0048 02/13/20 1505 02/13/20 1527 02/14/20 0232 02/14/20 1619 02/15/20 0428  NA 134* 132* 131* 135 130* 134* 130* 134*  K 5.0 4.6 4.9 4.0 3.9 5.1 5.4* 5.0  CL 92* 89* 90* 92*  --  94* 96* 97*  CO2 _1 --  21* 20* 23  GLUCOSE 68* 120* 162* 174*  --  139* 132* 152*  BUN 43* 44* 48* 39*  --  41* 29* 25*  CREATININE 9.46* 10.18* 10.71* 9.25*  --  8.30* 5.77* 4.52*  ALBUMIN 2.1* 2.1* 2.0* 2.1*  --  2.1* 1.9* 2.0*  CALCIUM 9.8 10.1 9.5 9.4  --  9.5 9.1 9.6  PHOS 8.2* 8.3* 7.6*  --   --  6.0* 4.3 3.8  AST  --   --   --  27  --   --   --   --   ALT  --   --   --  18  --   --   --   --    Liver Function Tests: Recent Labs  Lab 02/13/20 1505 02/14/20 0232 02/14/20 1619 02/15/20 0428  AST 27  --   --   --   ALT 18  --   --   --   ALKPHOS 179*  --   --   --   BILITOT 0.8  --   --   --   PROT 6.3*  --   --   --   ALBUMIN 2.1* 2.1* 1.9* 2.0*   No results for input(s): LIPASE, AMYLASE in the last 168 hours. No  results for input(s): AMMONIA in the last 168 hours. CBC: Recent Labs  Lab 02/13/2020 1053 02/25/2020 2045 02/12/20 0430 02/13/20 0204 02/13/20 1505 02/13/20 1527  WBC 34.6*  --  42.8* 37.8* 60.5*  --   NEUTROABS 29.1*  --  35.2*  --  53.2*  --   HGB 11.3*   < > 10.2* 10.0* 10.6* 10.5*  HCT 38.1   < > 33.2* 32.6* 35.3* 31.0*  MCV 103.5*  --  104.1* 104.5* 108.3*  --   PLT 331  --  327 294 374  --    < > = values in this interval not displayed.   Cardiac Enzymes: No results for input(s): CKTOTAL, CKMB, CKMBINDEX, TROPONINI in the last 168 hours. CBG: Recent Labs  Lab 02/14/20 2004 02/14/20 2340 02/15/20 0447 02/15/20 0828 02/15/20 1120  GLUCAP 158* 150* 145* 161* 185*    Iron Studies: No results for input(s): IRON, TIBC, TRANSFERRIN, FERRITIN in the last 72 hours. Studies/Results: DG Abd 1 View  Result Date: 02/13/2020 CLINICAL DATA:  60 year old female with enteric tube placement. EXAM: ABDOMEN - 1 VIEW COMPARISON:  CT abdomen pelvis dated 03/07/2020. FINDINGS: Partially visualized enteric tube with tip and side-port in the proximal stomach. No bowel dilatation. Degenerative changes of the spine. The osseous structures are grossly unremarkable. Atherosclerotic calcification of the aorta. IMPRESSION: Enteric tube with tip and side-port in the proximal stomach. Electronically Signed   By: Anner Crete M.D.   On: 02/13/2020 16:09   Portable Chest x-ray  Result Date: 02/13/2020 CLINICAL DATA:  Endotracheal tube EXAM: PORTABLE CHEST 1 VIEW COMPARISON:  02/13/2020 FINDINGS: Endotracheal tube with the tip 1.2 cm above the carina. Recommend retracting the endotracheal tube 2 cm. Nasogastric tube coursing below the diaphragm. Bibasilar airspace disease which may reflect atelectasis versus pneumonia. No pleural effusion or pneumothorax. Heart and mediastinal contours are unremarkable. No acute osseous abnormality. IMPRESSION: 1. Endotracheal tube with the tip 1.2 cm above the carina.  Recommend retracting the endotracheal tube 2 cm. 2. Bibasilar airspace disease which may reflect atelectasis versus pneumonia. Electronically Signed   By: Kathreen Devoid   On: 02/13/2020 15:46   . aspirin  81 mg Per Tube Daily  . atorvastatin  80 mg Per Tube QHS  . chlorhexidine gluconate (MEDLINE KIT)  15 mL Mouth Rinse BID  . Chlorhexidine Gluconate Cloth  6 each Topical Q0600  . docusate  100 mg Per Tube BID  . gabapentin  50 mg Per Tube QHS  . heparin  5,000 Units Subcutaneous Q8H  . insulin aspart  0-6 Units Subcutaneous Q4H  . mouth rinse  15 mL Mouth Rinse 10 times per day  . midodrine  2.5 mg Per Tube TID WC  . polyethylene glycol  17 g Per Tube Daily  . silver sulfADIAZINE   Topical BID  . traMADol  25 mg Per Tube Q12H    BMET    Component Value Date/Time   NA 134 (L) 02/15/2020 0428   NA 142 04/12/2017 0900   NA 141 09/11/2013 1138   K 5.0 02/15/2020 0428   K 3.9 09/11/2013 1138   CL 97 (L) 02/15/2020 0428   CO2 23 02/15/2020 0428   CO2 26 09/11/2013 1138   GLUCOSE 152 (H) 02/15/2020 0428   GLUCOSE 341 (H) 09/11/2013 1138   BUN 25 (H) 02/15/2020 0428   BUN 41 (H) 04/12/2017 0900   BUN 31.4 (H) 09/11/2013 1138   CREATININE 4.52 (H) 02/15/2020 0428   CREATININE 6.19 (H) 12/07/2015 1417   CREATININE 1.7 (H) 09/11/2013 1138   CALCIUM 9.6 02/15/2020 0428   CALCIUM 9.5 09/11/2013 1138   GFRNONAA 10 (L) 02/15/2020 0428   GFRNONAA 24 (L) 10/26/2014 0950   GFRAA 4 (L) 04/25/2018 0956   GFRAA 27 (L) 10/26/2014 0950   CBC    Component Value Date/Time   WBC 60.5 (HH) 02/13/2020 1505   RBC 3.26 (L) 02/13/2020 1505   HGB 10.5 (L) 02/13/2020 1527   HGB 12.5 11/25/2019 1003   HGB 11.3 (L) 09/11/2013 1138   HCT 31.0 (L) 02/13/2020 1527   HCT 36.3 11/25/2019 1003   HCT 34.7 (L) 09/11/2013 1138   PLT 374 02/13/2020 1505   PLT 391 11/25/2019 1003   MCV 108.3 (H) 02/13/2020 1505   MCV 97 11/25/2019 1003   MCV 83.6 09/11/2013 1138  MCH 32.5 03-07-2020 1505   MCHC 30.0  03-07-2020 1505   RDW 18.9 (H) 03/07/20 1505   RDW 15.0 11/25/2019 1003   RDW 13.6 09/11/2013 1138   LYMPHSABS 3.6 March 07, 2020 1505   LYMPHSABS 4.6 (H) 09/11/2013 1138   MONOABS 2.4 (H) 03/07/20 1505   MONOABS 1.0 (H) 09/11/2013 1138   EOSABS 0.0 Mar 07, 2020 1505   EOSABS 0.3 09/11/2013 1138   BASOSABS 0.0 03-07-20 1505   BASOSABS 0.0 09/11/2013 1138    Dialysis Orders: MWF @ [facility] 3:30hr, 450/800, EDW 97kg, 2K/2Ca, UFP #4, AVF, heparin 8000u bolus - Sensipar 30 mg PO q HD - Hectoral 90mg IV q HD - Mircera 100 q 2 weeks - ordered, not yet given, Hgb 10.8 with tsat 9 on 01/28/20  Assessment/Plan: 1. PEA Arrest - felt to be due to sepsis. currently on levophed, midodrine, zosyn, and vancomycin per PCCM. 2. Altered Mental Status - apparently started yesterday afternoon. She did receive tramadol as well as gabapentin, however she remains confused again today. Was also found to have multiple hypotensive events and thought this could be related to her AMS. Workup per primary svc. Has also had multiple episodes of hypoglycemia. 3. Acute hypoxic respiratory failure due to covid-19 pneumonia- currently intubated in ICU.  Already given IV remdesivir at last admission. 4. ESRD- pt coded during HD on 22022-02-27and transferred to the ICU and started on CRRT 202/27/221. CRRT orders:  Goal uf 50-100 ml/hr, prefilter 4K/2.5Ca at 300 ml/hr, post-filter 4K/2.5Ca at 300 ml/hr, dialysate at 1,000 ml/hr.  Anticoagulation : heparin 2. Changes today:  Increase dialysate to 1500 ml/hr due to hyperkalemia.  5. Hypertension/volume- will start to UF 50-100 ml/hr with CRRT as tolerated. Continue with midodrine and levophed for now and follow.  6. Anemia- Hgb stable 7. Metabolic bone disease- npo for now 8. Nutrition- renal, carb modified 9. Calciphylaxis- of bilateral thighs, chronic but possible source of infection especially with unstageable right hip wound   JDonetta Potts  MD [facility]([phone]

## 2020-02-16 ENCOUNTER — Inpatient Hospital Stay (HOSPITAL_COMMUNITY): Payer: Medicare Other

## 2020-02-16 DIAGNOSIS — A419 Sepsis, unspecified organism: Secondary | ICD-10-CM

## 2020-02-16 DIAGNOSIS — J9601 Acute respiratory failure with hypoxia: Secondary | ICD-10-CM | POA: Diagnosis not present

## 2020-02-16 DIAGNOSIS — R6521 Severe sepsis with septic shock: Secondary | ICD-10-CM

## 2020-02-16 DIAGNOSIS — I469 Cardiac arrest, cause unspecified: Secondary | ICD-10-CM

## 2020-02-16 LAB — PHOSPHORUS: Phosphorus: 4.3 mg/dL (ref 2.5–4.6)

## 2020-02-16 LAB — MAGNESIUM
Magnesium: 2.3 mg/dL (ref 1.7–2.4)
Magnesium: 2.4 mg/dL (ref 1.7–2.4)

## 2020-02-16 LAB — RENAL FUNCTION PANEL
Albumin: 2 g/dL — ABNORMAL LOW (ref 3.5–5.0)
Anion gap: 16 — ABNORMAL HIGH (ref 5–15)
BUN: 17 mg/dL (ref 8–23)
CO2: 22 mmol/L (ref 22–32)
Calcium: 10 mg/dL (ref 8.9–10.3)
Chloride: 96 mmol/L — ABNORMAL LOW (ref 98–111)
Creatinine, Ser: 2.94 mg/dL — ABNORMAL HIGH (ref 0.44–1.00)
GFR, Estimated: 18 mL/min — ABNORMAL LOW (ref >60)
Glucose, Bld: 181 mg/dL — ABNORMAL HIGH (ref 70–99)
Phosphorus: 3.5 mg/dL (ref 2.5–4.6)
Potassium: 4.5 mmol/L (ref 3.5–5.1)
Sodium: 134 mmol/L — ABNORMAL LOW (ref 135–145)

## 2020-02-16 LAB — CULTURE, BLOOD (ROUTINE X 2)
Culture: NO GROWTH
Special Requests: ADEQUATE

## 2020-02-16 LAB — GLUCOSE, CAPILLARY
Glucose-Capillary: 156 mg/dL — ABNORMAL HIGH (ref 70–99)
Glucose-Capillary: 171 mg/dL — ABNORMAL HIGH (ref 70–99)
Glucose-Capillary: 175 mg/dL — ABNORMAL HIGH (ref 70–99)
Glucose-Capillary: 179 mg/dL — ABNORMAL HIGH (ref 70–99)
Glucose-Capillary: 181 mg/dL — ABNORMAL HIGH (ref 70–99)
Glucose-Capillary: 189 mg/dL — ABNORMAL HIGH (ref 70–99)
Glucose-Capillary: 214 mg/dL — ABNORMAL HIGH (ref 70–99)

## 2020-02-16 LAB — CBC
HCT: 35.8 % — ABNORMAL LOW (ref 36.0–46.0)
Hemoglobin: 10.5 g/dL — ABNORMAL LOW (ref 12.0–15.0)
MCH: 31 pg (ref 26.0–34.0)
MCHC: 29.3 g/dL — ABNORMAL LOW (ref 30.0–36.0)
MCV: 105.6 fL — ABNORMAL HIGH (ref 80.0–100.0)
Platelets: 281 10*3/uL (ref 150–400)
RBC: 3.39 MIL/uL — ABNORMAL LOW (ref 3.87–5.11)
RDW: 19.2 % — ABNORMAL HIGH (ref 11.5–15.5)
WBC: 40.7 10*3/uL — ABNORMAL HIGH (ref 4.0–10.5)
nRBC: 0 % (ref 0.0–0.2)

## 2020-02-16 LAB — APTT: aPTT: 99 seconds — ABNORMAL HIGH (ref 24–36)

## 2020-02-16 LAB — POCT ACTIVATED CLOTTING TIME
Activated Clotting Time: 208 seconds
Activated Clotting Time: 220 seconds
Activated Clotting Time: 219 seconds

## 2020-02-16 LAB — MRSA PCR SCREENING: MRSA by PCR: NEGATIVE

## 2020-02-16 MED ORDER — MIDODRINE HCL 5 MG PO TABS
10.0000 mg | ORAL_TABLET | Freq: Three times a day (TID) | ORAL | Status: DC
  Administered 2020-02-16 – 2020-02-17 (×4): 10 mg
  Filled 2020-02-16 (×4): qty 2

## 2020-02-16 MED ORDER — DOXERCALCIFEROL 4 MCG/2ML IV SOLN
11.0000 ug | INTRAVENOUS | Status: DC
  Filled 2020-02-16: qty 6

## 2020-02-16 MED ORDER — PROSOURCE TF PO LIQD: 45.0000 mL | Freq: Two times a day (BID) | ORAL | Status: DC

## 2020-02-16 MED ORDER — PIPERACILLIN-TAZOBACTAM 3.375 G IVPB
3.3750 g | Freq: Two times a day (BID) | INTRAVENOUS | Status: AC
  Administered 2020-02-16 – 2020-02-20 (×9): 3.375 g via INTRAVENOUS
  Filled 2020-02-16 (×9): qty 50

## 2020-02-16 MED ORDER — PROSOURCE TF PO LIQD
90.0000 mL | Freq: Three times a day (TID) | ORAL | Status: DC
  Administered 2020-02-16 – 2020-02-20 (×12): 90 mL
  Filled 2020-02-16 (×12): qty 90

## 2020-02-16 MED ORDER — B COMPLEX-C PO TABS
1.0000 | ORAL_TABLET | Freq: Every day | ORAL | Status: DC
  Administered 2020-02-16 – 2020-02-20 (×5): 1
  Filled 2020-02-16 (×5): qty 1

## 2020-02-16 MED ORDER — HEPARIN SODIUM (PORCINE) 1000 UNIT/ML DIALYSIS
1000.0000 [IU] | INTRAMUSCULAR | Status: DC | PRN
  Administered 2020-02-16: 3200 [IU] via INTRAVENOUS_CENTRAL
  Filled 2020-02-16: qty 3
  Filled 2020-02-16: qty 6

## 2020-02-16 MED ORDER — VITAL HIGH PROTEIN PO LIQD: 1000.0000 mL | ORAL | Status: DC

## 2020-02-16 MED ORDER — VITAL AF 1.2 CAL PO LIQD
1000.0000 mL | ORAL | Status: DC
  Administered 2020-02-16 – 2020-02-18 (×2): 1000 mL
  Filled 2020-02-16 (×2): qty 1000

## 2020-02-16 NOTE — Progress Notes (Signed)
[facility] Progress Note  Subjective: seen in ICU, on vent, sedated  Vitals:   02/16/20 0830 02/16/20 0845 02/16/20 0900 02/16/20 0901  BP: (!) 94/51 (!) 98/50  (!) 98/52  Pulse: 77 78 80 80  Resp: 20 18 (!) 24 (!) 22  Temp:      TempSrc:      SpO2: 97% 97% 98% 98%  Weight:      Height:        Exam:  on vent ,sedated  no jvd  throat ett in place  Chest cta bilat and lat  Cor reg no RG  Abd soft ntnd no ascites   Ext woody bilat thigh/ hip edema 1-2+   Neuro on vent and sedated, not following commands   LUE AVF+bruit     OP HD: [facility]WF [place]   3.5h  450/800  97kg  2/2 bath  P4  L AVF  Hep 8000 - Sensipar 30 mg PO q HD - Hectoral 21mg IV q HD - Mircera 100 q 2 weeks - ordered, not yet given, Hgb 10.8 with tsat 9 on 01/28/20    Assessment/ Plan: 1. PEA Arrest - prob d/t sepsis. Pressors down, getting midodrine and IV abx 2. Acute hypoxic resp failure - d/t COVID pna +/- pulm edema, on vent in ICU. Had remdesivir last admission. CXR better, still pna but no edema. Down 4L here 3. ESRD - on HD MWF.  On CRRT here 2/4- current. Any vol excess suspect has resolved. Will d/w pmd possibly transitioning to iHD.  4. H/o calciphylaxis - of bilat thighs, chronic issue. 5. BP/ volume - as above, BP's soft, on midodrine, weaning pressors.  6. 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  7. Anemia ckd - Hb 10's, hold on esa for now 8. MBD ckd - cont vdra IV, resume binders/ sensipar when eating     RKelly Splinter2/07/2020, 9:23 AM   Recent Labs  Lab 02/13/20 1505 02/13/20 1527 02/14/20 0232 02/15/20 1600 02/16/20 0215  K 4.0 3.9   < > 4.9 4.5  BUN 39*  --    < > 20 17  CREATININE 9.25*  --    < > 3.47* 2.94*  CALCIUM 9.4  --    < > 9.9 10.0  PHOS  --   --    < > 3.9 3.5  HGB 10.6* 10.5*  --   --   --    < > = values in this interval not displayed.   Inpatient medications: . aspirin  81 mg Per Tube Daily  . atorvastatin  80 mg Per  Tube QHS  . chlorhexidine gluconate (MEDLINE KIT)  15 mL Mouth Rinse BID  . Chlorhexidine Gluconate Cloth  6 each Topical Q0600  . docusate  100 mg Per Tube BID  . gabapentin  50 mg Per Tube QHS  . heparin  5,000 Units Subcutaneous Q8H  . insulin aspart  0-6 Units Subcutaneous Q4H  . mouth rinse  15 mL Mouth Rinse 10 times per day  . midodrine  10 mg Per Tube TID WC  . polyethylene glycol  17 g Per Tube Daily  . silver sulfADIAZINE   Topical BID   .  prismasol BGK 4/2.5 300 mL/hr at 02/15/20 1021  .  prismasol BGK 4/2.5 300 mL/hr at 02/15/20 1022  . sodium chloride Stopped (02/14/20 1222)  . famotidine (PEPCID) IV Stopped (02/15/20 2228)  . heparin 10,000 units/ 20 mL infusion syringe 1,050  Units/hr (02/16/20 0900)  . norepinephrine (LEVOPHED) Adult infusion 1 mcg/min (02/16/20 0900)  . piperacillin-tazobactam (ZOSYN)  IV 12.5 mL/hr at 02/16/20 0900  . prismasol BGK 4/2.5 1,000 mL/hr at 02/15/20 2016  . propofol (DIPRIVAN) infusion Stopped (02/16/20 0840)  . vancomycin Stopped (02/16/20 0854)   albuterol, bisacodyl, heparin, heparin, heparin

## 2020-02-16 NOTE — Progress Notes (Signed)
PMT consult received and chart reviewed. Discussed with Dr. Tacy Learn who reports she is doing well this afternoon and almost ready for extubation. Per Dr. Tacy Learn, hold on palliative consultation for now. Thank you.   NO CHARGE  Ihor Dow, [place], FNP-C Palliative Medicine Team  Phone: [phone] Fax: [phone]

## 2020-02-16 NOTE — Progress Notes (Signed)
Rachel Chaney, MRN:  [id number], DOB:  Jan 10, 1960, LOS: 4 ADMISSION DATE:  03/02/2020, CONSULTATION DATE:  02/16/2020 REFERRING MD:  FMTS, CHIEF COMPLAINT:  PEA arrest    Brief History:  Rachel Chaney is a53 y.o. female with a pPMH of ESRD on HD (MWF), Chronic hypoxic respiratory failure, T2DM, HFpEF, HTN, HLD, CAD s/p MI (2020) who presented to [facility] with chronic nonspecific skin pain over her bilateral lower extremities and her hospitalization has been complicated by acute on chronic hypoxic respiratory failure and hypotension.    Patient had PEA cardiac arrest during dialysis on 2/4, she was transferred to ICU  Past Medical History:  ESRD  Type II diabetes  HLD HTN Cardiac arrest  CHF  Significant Hospital Events:  02/02 - Admitted for leg pain found hypoxic and hypotensive.  02/04 - PEA arrest  Consults:  Nephrology  Cardiology  Procedures:    Significant Diagnostic Tests:  01/23 Echo > 60 to 65%  02/02 CXR > Patchy BILATERAL pulmonary infiltrates favor multifocal pneumonia over pulmonary edema.  02/02 CT Abdomen/Pelvis > No acute abnormality. Multiple chronic findings as detailed above, not substantially changed from recent prior study.  02/02 CT left and right femur > No acute osseous abnormality. There is nonspecific lower extremity edema without evidence for an abscess. Findings are nonspecific but can be seen in patients with cellulitis in the appropriate clinical setting. Other differential considerations include chronic venous insufficiency given the presence of subcutaneous calcifications.  Micro Data:  MRSA PCR 2/4 > Blood culture 2/4 > Respiratory 2/4 >  Antimicrobials:  Zosyn 2/4 > Vancomycin 2/2 >  Interim History / Subjective:  Patient was placed on pressure support trial, unfortunately she is not strong enough, requiring higher pressure support than 8, not ready to be extubated Nephrology recommend keeping off CRRT and reevaluate  tomorrow  Objective   Blood pressure (!) 115/58, pulse 91, temperature 98.5 F (36.9 C), temperature source Oral, resp. rate (!) 23, height 5\' 4"  (1.626 m), weight 91.3 kg, last menstrual period 09/18/2011, SpO2 98 %.    Vent Mode: PSV;CPAP FiO2 (%):  [30 %] 30 % Set Rate:  [18 bmp] 18 bmp Vt Set:  [430 mL] 430 mL PEEP:  [5 cmH20] 5 cmH20 Pressure Support:  [10 cmH20] 10 cmH20 Plateau Pressure:  [18 cmH20-19 cmH20] 18 cmH20   Intake/Output Summary (Last 24 hours) at 02/16/2020 1416 Last data filed at 02/16/2020 1200 Gross per 24 hour  Intake 910.68 ml  Output 2620 ml  Net -1709.32 ml   Filed Weights   02/14/20 0227 02/15/20 0434 02/16/20 0500  Weight: 98.2 kg 93.6 kg 91.3 kg    Examination: General: Acute on chronic ill-appearing middle-aged female lying in bed on mechanical ventilation in no acute distress HEENT: ETT, MM pink/moist, PERRL,  Neuro: Eyes open following commands, moving all 4 extremities CV: s1s2 regular rate and rhythm, no murmur, rubs, or gallops,  PULM: Difficulty falling, will additional breath sounds, tolerating ventilator well GI: soft, bowel sounds active in all 4 quadrants, non-tender, non-distended, tolerating tube feeds Extremities: warm/dry, no edema  Skin: no rashes or lesions  Resolved Hospital Problem list     Assessment & Plan:  PEA Arrest presumably due to Septic Shock  Finally she came off of vasopressors Continue midodrine Continuous telemetry Continue treatment for sepsis as below Supportive care  Acute Hypoxic Respiratory Failure  HCAP Currently on pressure support trial Continue ventilator support with lung protective strategies Wean PEEP and FiO2 for sats  greater than 90% Daily SBT trials, mentation precludes extubation Head of bed elevated at 30 degrees Plateau pressures less than 30 Follow intermittent chest x-ray and ABG Follow cultures Vent bundle in place Pad protocol MRSA PCR is negative Stop vancomycin Continue  Zosyn  Anoxic encephalopathy was ruled out Patient is following commands, alert awake  ESRD on HD Nephrology recommend stopping CRRT today She is off vasopressors We will try IHD  T2DM Continue SSi  CBG q4hr   Best practice (evaluated daily)  Diet: NPO, tube feeds Pain/Anxiety/Delirium protocol (if indicated): As needed  VAP protocol (if indicated): In place  DVT prophylaxis: Heparin  GI prophylaxis: PPI Glucose control: SSI Mobility: Bedrest Disposition:ICU  Code Status: Full   Labs   CBC: Recent Labs  Lab 02/22/2020 1053 03/04/2020 2045 02/12/20 0430 02/13/20 0204 02/13/20 1505 02/13/20 1527 02/16/20 0910  WBC 34.6*  --  42.8* 37.8* 60.5*  --  40.7*  NEUTROABS 29.1*  --  35.2*  --  53.2*  --   --   HGB 11.3*   < > 10.2* 10.0* 10.6* 10.5* 10.5*  HCT 38.1   < > 33.2* 32.6* 35.3* 31.0* 35.8*  MCV 103.5*  --  104.1* 104.5* 108.3*  --  105.6*  PLT 331  --  327 294 374  --  281   < > = values in this interval not displayed.    Basic Metabolic Panel: Recent Labs  Lab 02/12/20 1432 02/13/20 0048 02/13/20 1505 02/14/20 0232 02/14/20 1619 02/15/20 0428 02/15/20 1600 02/16/20 0215  NA 132* 131*   < > 134* 130* 134* 135 134*  K 4.6 4.9   < > 5.1 5.4* 5.0 4.9 4.5  CL 89* 90*   < > 94* 96* 97* 99 96*  CO2 26 24   < > 21* 20* 23 23 22   GLUCOSE 120* 162*   < > 139* 132* 152* 172* 181*  BUN 44* 48*   < > 41* 29* 25* 20 17  CREATININE 10.18* 10.71*   < > 8.30* 5.77* 4.52* 3.47* 2.94*  CALCIUM 10.1 9.5   < > 9.5 9.1 9.6 9.9 10.0  MG 1.9 1.9  --  2.0  --  2.2  --  2.3  PHOS 8.3* 7.6*  --  6.0* 4.3 3.8 3.9 3.5   < > = values in this interval not displayed.   GFR: Estimated Creatinine Clearance: 22 mL/min (A) (by C-G formula based on SCr of 2.94 mg/dL (H)). Recent Labs  Lab 02/12/2020 1657 02/12/20 0430 02/13/20 0204 02/13/20 1505 02/13/20 1826 02/13/20 2116 02/16/20 0910  PROCALCITON  --   --   --   --  92.99  --   --   WBC  --  42.8* 37.8* 60.5*  --   --   40.7*  LATICACIDVEN 1.6  --   --  5.8*  --  2.1*  --     Liver Function Tests: Recent Labs  Lab 02/13/20 1505 02/14/20 0232 02/14/20 1619 02/15/20 0428 02/15/20 1600 02/16/20 0215  AST 27  --   --   --   --   --   ALT 18  --   --   --   --   --   ALKPHOS 179*  --   --   --   --   --   BILITOT 0.8  --   --   --   --   --   PROT 6.3*  --   --   --   --   --  ALBUMIN 2.1* 2.1* 1.9* 2.0* 1.9* 2.0*   No results for input(s): LIPASE, AMYLASE in the last 168 hours. No results for input(s): AMMONIA in the last 168 hours.  ABG    Component Value Date/Time   PHART 7.415 02/13/2020 1527   PCO2ART 39.9 02/13/2020 1527   PO2ART 173 (H) 02/13/2020 1527   HCO3 25.7 02/13/2020 1527   TCO2 27 02/13/2020 1527   ACIDBASEDEF 2.1 (H) 11/12/2019 0125   O2SAT 100.0 02/13/2020 1527     Coagulation Profile: No results for input(s): INR, PROTIME in the last 168 hours.  Cardiac Enzymes: No results for input(s): CKTOTAL, CKMB, CKMBINDEX, TROPONINI in the last 168 hours.  HbA1C: HbA1c, POC (controlled diabetic range)  Date/Time Value Ref Range Status  03/11/2019 10:20 AM 7.7 (A) 0.0 - 7.0 % Final  08/23/2017 08:43 AM 9.1 (A) 0.0 - 7.0 % Final   Hgb A1c MFr Bld  Date/Time Value Ref Range Status  02/12/2020 04:30 AM 5.5 4.8 - 5.6 % Final    Comment:    (NOTE) Pre diabetes:          5.7%-6.4%  Diabetes:              >6.4%  Glycemic control for   <7.0% adults with diabetes   11/12/2019 05:10 AM 9.0 (H) 4.8 - 5.6 % Final    Comment:    (NOTE) Pre diabetes:          5.7%-6.4%  Diabetes:              >6.4%  Glycemic control for   <7.0% adults with diabetes     CBG: Recent Labs  Lab 02/15/20 2009 02/15/20 2358 02/16/20 0353 02/16/20 0744 02/16/20 1159  GLUCAP 169* 157* 156* 171* 189*    Total critical care time: 39  minutes  Performed by: [place] care time was exclusive of separately billable procedures and treating other patients.   Critical care  was necessary to treat or prevent imminent or life-threatening deterioration.   Critical care was time spent personally by me on the following activities: development of treatment plan with patient and/or surrogate as well as nursing, discussions with consultants, evaluation of patient's response to treatment, examination of patient, obtaining history from patient or surrogate, ordering and performing treatments and interventions, ordering and review of laboratory studies, ordering and review of radiographic studies, pulse oximetry and re-evaluation of patient's condition.   Jacky Kindle MD [place] [facility] See Amion for pager If no response to pager, please call [phone] until 7pm After 7pm, Please call E-link [phone]

## 2020-02-16 NOTE — Progress Notes (Signed)
Initial Nutrition Assessment  DOCUMENTATION CODES:   Obesity unspecified  INTERVENTION:   Initiate Vital AF 1.2 @ 45 ml/hr via OGT (1080 ml/ day)  90 ml Prosource TF TID.    Tube feeding regimen provides 1536 kcal (100% of needs), 147 grams of protein, and 876 ml of H2O.   -B-complex with vitamin C daily  NUTRITION DIAGNOSIS:   Inadequate oral intake related to inability to eat as evidenced by NPO status.  GOAL:   Provide needs based on ASPEN/SCCM guidelines  MONITOR:   Vent status,Labs,Weight trends,TF tolerance,Skin,I & O's  REASON FOR ASSESSMENT:   Ventilator    ASSESSMENT:   Rachel Chaney is a83 y.o. female with a pPMH of ESRD on HD (MWF), Chronic hypoxic respiratory failure, T2DM, HFpEF, HTN, HLD, CAD s/p MI (2020) who presented to [facility] with chronic nonspecific skin pain over her bilateral lower extremities and her hospitalization has been complicated by acute on chronic hypoxic respiratory failure and hypotension.  Pt admitted with PEA arrest secondary to septic shock.   2/2- admitted for leg pain, found to be hypoxic and hypotensive 2/4- s/p PEA arrest  Patient is currently intubated on ventilator support. OGT placement verified by x-ray. MV: 8.3 L/min Temp (24hrs), Avg:97.7 F (36.5 C), Min:95.6 F (35.3 C), Max:98.8 F (37.1 C)  Reviewed I/O's: -2.4 L x 24 hours and -4.1 L since admission  OGT output: 350 ml x 24 hours  MAP: 72   Case discussed with MD, who gave RD permission to start TF today.   CRRT d/c today; plan to transition to iHD.   Medications reviewed and include colace, miralax, and levophed.   Labs reviewed: CBGS: 171-189 (inpatient orders for glycemic control are none).   NUTRITION - FOCUSED PHYSICAL EXAM:  Flowsheet Row Most Recent Value  Orbital Region No depletion  Upper Arm Region No depletion  Thoracic and Lumbar Region No depletion  Buccal Region No depletion  Temple Region No depletion  Clavicle Bone Region No  depletion  Clavicle and Acromion Bone Region No depletion  Scapular Bone Region No depletion  Dorsal Hand No depletion  Patellar Region No depletion  Anterior Thigh Region No depletion  Posterior Calf Region No depletion  Edema (RD Assessment) Mild  Hair Reviewed  Eyes Reviewed  Mouth Reviewed  Skin Reviewed  Nails Reviewed       Diet Order:   Diet Order            Diet NPO time specified  Diet effective now                 EDUCATION NEEDS:   Not appropriate for education at this time  Skin:  Skin Assessment: Skin Integrity Issues: Skin Integrity Issues:: Unstageable Unstageable: rt thigh  Last BM:  02/15/20  Height:   Ht Readings from Last 1 Encounters:  02/16/20 5\' 4"  (1.626 m)    Weight:   Wt Readings from Last 1 Encounters:  02/16/20 91.3 kg    Ideal Body Weight:  54.5 kg  BMI:  Body mass index is 34.55 kg/m.  Estimated Nutritional Needs:   Kcal:  1392  Protein:  135-150 grams  Fluid:  1000 ml + UOP    Loistine Chance, RD, LDN, [place] Registered Dietitian II Certified Diabetes Care and Education Specialist Please refer to [facility] for RD and/or RD on-call/weekend/after hours pager

## 2020-02-17 DIAGNOSIS — A419 Sepsis, unspecified organism: Secondary | ICD-10-CM | POA: Diagnosis not present

## 2020-02-17 DIAGNOSIS — R6521 Severe sepsis with septic shock: Secondary | ICD-10-CM | POA: Diagnosis not present

## 2020-02-17 DIAGNOSIS — J9601 Acute respiratory failure with hypoxia: Secondary | ICD-10-CM | POA: Diagnosis not present

## 2020-02-17 LAB — MAGNESIUM
Magnesium: 2.6 mg/dL — ABNORMAL HIGH (ref 1.7–2.4)
Magnesium: 2.7 mg/dL — ABNORMAL HIGH (ref 1.7–2.4)

## 2020-02-17 LAB — BASIC METABOLIC PANEL
Anion gap: 15 (ref 5–15)
BUN: 28 mg/dL — ABNORMAL HIGH (ref 8–23)
CO2: 24 mmol/L (ref 22–32)
Calcium: 10.8 mg/dL — ABNORMAL HIGH (ref 8.9–10.3)
Chloride: 96 mmol/L — ABNORMAL LOW (ref 98–111)
Creatinine, Ser: 3.75 mg/dL — ABNORMAL HIGH (ref 0.44–1.00)
GFR, Estimated: 13 mL/min — ABNORMAL LOW (ref >60)
Glucose, Bld: 211 mg/dL — ABNORMAL HIGH (ref 70–99)
Potassium: 4.3 mmol/L (ref 3.5–5.1)
Sodium: 135 mmol/L (ref 135–145)

## 2020-02-17 LAB — PHOSPHORUS
Phosphorus: 4.2 mg/dL (ref 2.5–4.6)
Phosphorus: 4.5 mg/dL (ref 2.5–4.6)

## 2020-02-17 LAB — CBC
HCT: 33.9 % — ABNORMAL LOW (ref 36.0–46.0)
Hemoglobin: 10.2 g/dL — ABNORMAL LOW (ref 12.0–15.0)
MCH: 32 pg (ref 26.0–34.0)
MCHC: 30.1 g/dL (ref 30.0–36.0)
MCV: 106.3 fL — ABNORMAL HIGH (ref 80.0–100.0)
Platelets: 286 10*3/uL (ref 150–400)
RBC: 3.19 MIL/uL — ABNORMAL LOW (ref 3.87–5.11)
RDW: 19 % — ABNORMAL HIGH (ref 11.5–15.5)
WBC: 40.4 10*3/uL — ABNORMAL HIGH (ref 4.0–10.5)
nRBC: 0 % (ref 0.0–0.2)

## 2020-02-17 LAB — GLUCOSE, CAPILLARY
Glucose-Capillary: 213 mg/dL — ABNORMAL HIGH (ref 70–99)
Glucose-Capillary: 236 mg/dL — ABNORMAL HIGH (ref 70–99)
Glucose-Capillary: 264 mg/dL — ABNORMAL HIGH (ref 70–99)
Glucose-Capillary: 265 mg/dL — ABNORMAL HIGH (ref 70–99)
Glucose-Capillary: 268 mg/dL — ABNORMAL HIGH (ref 70–99)
Glucose-Capillary: 282 mg/dL — ABNORMAL HIGH (ref 70–99)

## 2020-02-17 LAB — TRIGLYCERIDES: Triglycerides: 169 mg/dL — ABNORMAL HIGH (ref <150)

## 2020-02-17 MED ORDER — MIDODRINE HCL 5 MG PO TABS
20.0000 mg | ORAL_TABLET | Freq: Three times a day (TID) | ORAL | Status: DC
  Administered 2020-02-17 – 2020-02-22 (×10): 20 mg
  Filled 2020-02-17 (×11): qty 4

## 2020-02-17 MED ORDER — INSULIN ASPART 100 UNIT/ML ~~LOC~~ SOLN
0.0000 [IU] | SUBCUTANEOUS | Status: DC
  Administered 2020-02-17 (×3): 11 [IU] via SUBCUTANEOUS
  Administered 2020-02-18 (×2): 4 [IU] via SUBCUTANEOUS
  Administered 2020-02-18: 3 [IU] via SUBCUTANEOUS
  Administered 2020-02-18: 4 [IU] via SUBCUTANEOUS
  Administered 2020-02-18 (×2): 11 [IU] via SUBCUTANEOUS
  Administered 2020-02-18: 7 [IU] via SUBCUTANEOUS
  Administered 2020-02-19: 20 [IU] via SUBCUTANEOUS

## 2020-02-17 NOTE — Progress Notes (Signed)
[facility] Progress Note  Subjective: seen in ICU, on vent, sedated  Vitals:   02/17/20 1330 02/17/20 1400 02/17/20 1430 02/17/20 1554  BP:  (!) 98/31 (!) 98/31   Pulse: 82 80 75   Resp: '19 19 19   ' Temp:      TempSrc:      SpO2: 93% 94% 95% 94%  Weight:      Height:        Exam:  on vent ,sedated  no jvd  throat ett in place  Chest cta bilat and lat  Cor reg no RG  Abd soft ntnd no ascites   Ext woody bilat thigh/ hip edema 1-2+   Neuro on vent and sedated, not following commands   LUE AVF+bruit     OP HD: MWF [place]   3.5h  450/800  97kg  2/2 bath  P4  L AVF  Hep 8000 - Sensipar 30 mg PO q HD - Hectoral 17mg IV q HD - Mircera 100 q 2 weeks - ordered, not yet given, Hgb 10.8 with tsat 9 on 01/28/20    Assessment/ Plan: 1. PEA Arrest - suspected d/t sepsis. Pressors down, getting midodrine and IV abx 2. Acute hypoxic resp failure - d/t COVID pna +/- pulm edema, on vent in ICU. Had remdesivir last admission. CXR better, still pna but no edema. Down 4L here 3. ESRD - on HD MWF.  On CRRT here 2/4- 2/7. DC'd yesterday. Plan regular HD tomorrow.  4. H/o calciphylaxis - of bilat thighs, chronic issue. 5. BP/ volume - on midodrine, weaning pressors.  6. 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  7. Anemia ckd - Hb 10's, hold on esa for now 8. MBD ckd - cont vdra IV, resume binders/ sensipar when eating     Rachel Chaney Rachel Chaney 02/17/2020, 4:14 PM   Recent Labs  Lab 02/16/20 0215 02/16/20 0910 02/16/20 1552 02/17/20 0649  K 4.5  --   --  4.3  BUN 17  --   --  28*  CREATININE 2.94*  --   --  3.75*  CALCIUM 10.0  --   --  10.8*  PHOS 3.5  --  4.3 4.2  HGB  --  10.5*  --  10.2*   Inpatient medications: . aspirin  81 mg Per Tube Daily  . atorvastatin  80 mg Per Tube QHS  . B-complex with vitamin C  1 tablet Per Tube Daily  . chlorhexidine gluconate (MEDLINE KIT)  15 mL Mouth Rinse BID  . Chlorhexidine Gluconate Cloth  6 each  Topical Q0600  . docusate  100 mg Per Tube BID  . feeding supplement (PROSource TF)  90 mL Per Tube TID  . gabapentin  50 mg Per Tube QHS  . heparin  5,000 Units Subcutaneous Q8H  . insulin aspart  0-20 Units Subcutaneous Q4H  . mouth rinse  15 mL Mouth Rinse 10 times per day  . midodrine  20 mg Per Tube TID WC  . polyethylene glycol  17 g Per Tube Daily  . silver sulfADIAZINE   Topical BID   . sodium chloride 10 mL/hr at 02/17/20 1500  . famotidine (PEPCID) IV 20 mg (02/16/20 2300)  . feeding supplement (VITAL AF 1.2 CAL) 45 mL/hr at 02/17/20 0900  . norepinephrine (LEVOPHED) Adult infusion 2 mcg/min (02/17/20 1353)  . piperacillin-tazobactam (ZOSYN)  IV Stopped (02/17/20 1345)  . propofol (DIPRIVAN) infusion 15 mcg/kg/min (02/17/20 1500)   albuterol, bisacodyl, heparin

## 2020-02-17 NOTE — Progress Notes (Addendum)
NAMEALANIE SYLER, MRN:  [id number], DOB:  12/05/1960, LOS: 5 ADMISSION DATE:  02/10/2020, CONSULTATION DATE:  02/20/2020 REFERRING MD:  FMTS, CHIEF COMPLAINT:  PEA arrest    Brief History:  Rachel Chaney is a14 y.o. female with a pPMH of ESRD on HD (MWF), Chronic hypoxic respiratory failure, T2DM, HFpEF, HTN, HLD, CAD s/p MI (2020) who presented to [facility] with chronic nonspecific skin pain over her bilateral lower extremities and her hospitalization has been complicated by acute on chronic hypoxic respiratory failure and hypotension.    Patient had PEA cardiac arrest during dialysis on 2/4, she was transferred to ICU  Past Medical History:  ESRD  Type II diabetes  HLD HTN Cardiac arrest  CHF  Significant Hospital Events:  02/02 - Admitted for leg pain found hypoxic and hypotensive.  02/04 - PEA arrest  Consults:  Nephrology  Cardiology  Procedures:    Significant Diagnostic Tests:  01/23 Echo > 60 to 65%  02/02 CXR > Patchy BILATERAL pulmonary infiltrates favor multifocal pneumonia over pulmonary edema.  02/02 CT Abdomen/Pelvis > No acute abnormality. Multiple chronic findings as detailed above, not substantially changed from recent prior study.  02/02 CT left and right femur > No acute osseous abnormality. There is nonspecific lower extremity edema without evidence for an abscess. Findings are nonspecific but can be seen in patients with cellulitis in the appropriate clinical setting. Other differential considerations include chronic venous insufficiency given the presence of subcutaneous calcifications.  Micro Data:  MRSA PCR 2/4 > Blood culture 2/4 > Respiratory 2/4 >  Antimicrobials:  Zosyn 2/4 > Vancomycin 2/2 > 2/7  Interim History / Subjective:   Norepinephrine 1 overnight Propofol 20 FiO2 0.30, PEEP 5 Transitioned off CVVHD to IHD  Objective   Blood pressure (!) 95/28, pulse 78, temperature 98 F (36.7 C), temperature source Axillary, resp. rate 18,  height 5\' 4"  (1.626 m), weight 90 kg, last menstrual period 09/18/2011, SpO2 91 %.    Vent Mode: PRVC FiO2 (%):  [30 %] 30 % Set Rate:  [18 bmp] 18 bmp Vt Set:  [430 mL] 430 mL PEEP:  [5 cmH20] 5 cmH20 Pressure Support:  [10 cmH20] 10 cmH20 Plateau Pressure:  [18 cmH20-19 cmH20] 19 cmH20   Intake/Output Summary (Last 24 hours) at 02/17/2020 0728 Last data filed at 02/17/2020 0600 Gross per 24 hour  Intake 960.36 ml  Output 465 ml  Net 495.36 ml   Filed Weights   02/15/20 0434 02/16/20 0500 02/17/20 0446  Weight: 93.6 kg 91.3 kg 90 kg    Examination: General: Obese ill-appearing woman, ventilated, poorly responsive HEENT: ET tube in place, oropharynx moist, pupils equal and react Neuro: Grimace to pain, does not open eyes, does not interact.  No spontaneous movement CV: Regular, distant, no murmur PULM: Soft bilateral inspiratory crackles, no wheezes GI: Nondistended, positive bowel sounds Extremities: No edema Skin: 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  Decubitus wound noted on hip, not examined by me today  Resolved Hospital Problem list     Assessment & Plan:  PEA Arrest presumably due to Septic Shock that was present on admission Back on low-dose norepinephrine this morning 2/8, attempt to wean, goal SBP 90 Continue midodrine, increase to 20 mg 3 times daily on 2/8 Follow telemetry Treating sepsis as below May be some room to decrease her sedating medication, decrease hemodynamic effects.  Acute Hypoxic Respiratory Failure  HCAP Has intermittently tolerated some pressure support but does not have the mental  status for extubation currently.  Okay to retry PSV today as we come down on sedating medication. Continue ventilation with low tidal volumes, 6 cc/kg VAP prevention orders Follow intermittent chest x-ray Sedation by PAD protocol Treating HCAP with Zosyn, day 4/7 on 2/8  Anoxic encephalopathy was ruled out Mental status is waxed and waned  but she has intermittently follow commands, been awake.  Not currently.  Need to begin to wean sedating medication  ESRD on HD CVVHD stopped on 2/7 Hopefully will be able to attempt intermittent HD in the next 24 hours if we can maintain adequate hemodynamics, wean norepinephrine to off  T2DM Sliding-scale insulin CBG q4hr   Best practice (evaluated daily)  Diet: NPO, tube feeds Pain/Anxiety/Delirium protocol (if indicated): As needed  VAP protocol (if indicated): In place  DVT prophylaxis: Heparin  GI prophylaxis: PPI Glucose control: SSI Mobility: Bedrest Disposition:ICU  Code Status: Full  Family: Updated husband by phone 2/8.   Labs   CBC: Recent Labs  Lab 03/07/2020 1053 02/19/2020 2045 02/12/20 0430 02/13/20 0204 02/13/20 1505 02/13/20 1527 02/16/20 0910  WBC 34.6*  --  42.8* 37.8* 60.5*  --  40.7*  NEUTROABS 29.1*  --  35.2*  --  53.2*  --   --   HGB 11.3*   < > 10.2* 10.0* 10.6* 10.5* 10.5*  HCT 38.1   < > 33.2* 32.6* 35.3* 31.0* 35.8*  MCV 103.5*  --  104.1* 104.5* 108.3*  --  105.6*  PLT 331  --  327 294 374  --  281   < > = values in this interval not displayed.    Basic Metabolic Panel: Recent Labs  Lab 02/13/20 0048 02/13/20 1505 02/14/20 0232 02/14/20 1619 02/15/20 0428 02/15/20 1600 02/16/20 0215 02/16/20 1552  NA 131*   < > 134* 130* 134* 135 134*  --   K 4.9   < > 5.1 5.4* 5.0 4.9 4.5  --   CL 90*   < > 94* 96* 97* 99 96*  --   CO2 24   < > 21* 20* 23 23 22   --   GLUCOSE 162*   < > 139* 132* 152* 172* 181*  --   BUN 48*   < > 41* 29* 25* 20 17  --   CREATININE 10.71*   < > 8.30* 5.77* 4.52* 3.47* 2.94*  --   CALCIUM 9.5   < > 9.5 9.1 9.6 9.9 10.0  --   MG 1.9  --  2.0  --  2.2  --  2.3 2.4  PHOS 7.6*  --  6.0* 4.3 3.8 3.9 3.5 4.3   < > = values in this interval not displayed.   GFR: Estimated Creatinine Clearance: 21.8 mL/min (A) (by C-G formula based on SCr of 2.94 mg/dL (H)). Recent Labs  Lab 03/02/2020 1657 02/12/20 0430  02/13/20 0204 02/13/20 1505 02/13/20 1826 02/13/20 2116 02/16/20 0910  PROCALCITON  --   --   --   --  92.99  --   --   WBC  --  42.8* 37.8* 60.5*  --   --  40.7*  LATICACIDVEN 1.6  --   --  5.8*  --  2.1*  --     Liver Function Tests: Recent Labs  Lab 02/13/20 1505 02/14/20 0232 02/14/20 1619 02/15/20 0428 02/15/20 1600 02/16/20 0215  AST 27  --   --   --   --   --   ALT 18  --   --   --   --   --  ALKPHOS 179*  --   --   --   --   --   BILITOT 0.8  --   --   --   --   --   PROT 6.3*  --   --   --   --   --   ALBUMIN 2.1* 2.1* 1.9* 2.0* 1.9* 2.0*   No results for input(s): LIPASE, AMYLASE in the last 168 hours. No results for input(s): AMMONIA in the last 168 hours.  ABG    Component Value Date/Time   PHART 7.415 02/13/2020 1527   PCO2ART 39.9 02/13/2020 1527   PO2ART 173 (H) 02/13/2020 1527   HCO3 25.7 02/13/2020 1527   TCO2 27 02/13/2020 1527   ACIDBASEDEF 2.1 (H) 11/12/2019 0125   O2SAT 100.0 02/13/2020 1527     Coagulation Profile: No results for input(s): INR, PROTIME in the last 168 hours.  Cardiac Enzymes: No results for input(s): CKTOTAL, CKMB, CKMBINDEX, TROPONINI in the last 168 hours.  HbA1C: HbA1c, POC (controlled diabetic range)  Date/Time Value Ref Range Status  03/11/2019 10:20 AM 7.7 (A) 0.0 - 7.0 % Final  08/23/2017 08:43 AM 9.1 (A) 0.0 - 7.0 % Final   Hgb A1c MFr Bld  Date/Time Value Ref Range Status  02/12/2020 04:30 AM 5.5 4.8 - 5.6 % Final    Comment:    (NOTE) Pre diabetes:          5.7%-6.4%  Diabetes:              >6.4%  Glycemic control for   <7.0% adults with diabetes   11/12/2019 05:10 AM 9.0 (H) 4.8 - 5.6 % Final    Comment:    (NOTE) Pre diabetes:          5.7%-6.4%  Diabetes:              >6.4%  Glycemic control for   <7.0% adults with diabetes     CBG: Recent Labs  Lab 02/16/20 1159 02/16/20 1645 02/16/20 1927 02/16/20 2333 02/17/20 0331  GLUCAP 189* 181* 179* 214* 213*    Total critical care  time: 34  minutes   Critical care time was exclusive of separately billable procedures and treating other patients.   Critical care was necessary to treat or prevent imminent or life-threatening deterioration.   Critical care was time spent personally by me on the following activities: development of treatment plan with patient and/or surrogate as well as nursing, discussions with consultants, evaluation of patient's response to treatment, examination of patient, obtaining history from patient or surrogate, ordering and performing treatments and interventions, ordering and review of laboratory studies, ordering and review of radiographic studies, pulse oximetry and re-evaluation of patient's condition.   Baltazar Apo, MD, PhD 02/17/2020, 10:14 AM [facility] [phone] or if no answer before 7:00PM call [phone] For any issues after 7:00PM please call eLink [phone]

## 2020-02-18 ENCOUNTER — Inpatient Hospital Stay (HOSPITAL_COMMUNITY): Payer: Medicare Other

## 2020-02-18 DIAGNOSIS — J9601 Acute respiratory failure with hypoxia: Secondary | ICD-10-CM | POA: Diagnosis not present

## 2020-02-18 LAB — GLUCOSE, CAPILLARY
Glucose-Capillary: 187 mg/dL — ABNORMAL HIGH (ref 70–99)
Glucose-Capillary: 161 mg/dL — ABNORMAL HIGH (ref 70–99)
Glucose-Capillary: 159 mg/dL — ABNORMAL HIGH (ref 70–99)
Glucose-Capillary: 216 mg/dL — ABNORMAL HIGH (ref 70–99)
Glucose-Capillary: 143 mg/dL — ABNORMAL HIGH (ref 70–99)
Glucose-Capillary: 287 mg/dL — ABNORMAL HIGH (ref 70–99)

## 2020-02-18 LAB — CULTURE, BLOOD (ROUTINE X 2)
Culture: NO GROWTH
Culture: NO GROWTH

## 2020-02-18 LAB — PHOSPHORUS: Phosphorus: 4.8 mg/dL — ABNORMAL HIGH (ref 2.5–4.6)

## 2020-02-18 LAB — MAGNESIUM: Magnesium: 2.5 mg/dL — ABNORMAL HIGH (ref 1.7–2.4)

## 2020-02-18 MED ORDER — DEXMEDETOMIDINE HCL IN NACL 400 MCG/100ML IV SOLN
0.0000 ug/kg/h | INTRAVENOUS | Status: DC
  Administered 2020-02-18 – 2020-02-19 (×2): 0.4 ug/kg/h via INTRAVENOUS
  Administered 2020-02-19: 0.5 ug/kg/h via INTRAVENOUS
  Administered 2020-02-20: 0.4 ug/kg/h via INTRAVENOUS
  Filled 2020-02-18 (×5): qty 100

## 2020-02-18 NOTE — Progress Notes (Signed)
NAMEJERALYNN Chaney, MRN:  [id number], DOB:  [birth year], LOS: 5 ADMISSION DATE:  03/06/2020, CONSULTATION DATE:  02/29/2020 REFERRING MD:  FMTS, CHIEF COMPLAINT:  PEA arrest    Brief History:  Rachel Chaney is a 60 y.o. female with a pPMH of ESRD on HD (MWF), Chronic hypoxic respiratory failure, T2DM, HFpEF, HTN, HLD, CAD s/p MI (2020) who presented to [facility] with chronic nonspecific skin pain over her bilateral lower extremities and her hospitalization has been complicated by acute on chronic hypoxic respiratory failure and hypotension.    Patient had PEA cardiac arrest during dialysis on 2/4, she was transferred to ICU  Past Medical History:  ESRD  Type II diabetes  HLD HTN Cardiac arrest  CHF  Significant Hospital Events:  02/02 - Admitted for leg pain found hypoxic and hypotensive.  02/04 - PEA arrest  Consults:  Nephrology  Cardiology  Procedures:    Significant Diagnostic Tests:  01/23 Echo > 60 to 65%  02/02 CXR > Patchy BILATERAL pulmonary infiltrates favor multifocal pneumonia over pulmonary edema.  02/02 CT Abdomen/Pelvis > No acute abnormality. Multiple chronic findings as detailed above, not substantially changed from recent prior study.  02/02 CT left and right femur > No acute osseous abnormality. There is nonspecific lower extremity edema without evidence for an abscess. Findings are nonspecific but can be seen in patients with cellulitis in the appropriate clinical setting. Other differential considerations include chronic venous insufficiency given the presence of subcutaneous calcifications.  Micro Data:  MRSA PCR 2/4 > Blood culture 2/4 > Respiratory 2/4 >  Antimicrobials:  Zosyn 2/4 > Vancomycin 2/2 > 2/7  Interim History / Subjective:   Propofol was increased this morning due to some degree of distress, accessory muscle use.  She has had diarrhea, Flexi-Seal placed Propofol at 20   Objective   Blood pressure (!) 109/35, pulse 98, temperature  99 F (37.2 C), temperature source Axillary, resp. rate (!) 22, height 5\' 4"  (1.626 m), weight 90 kg, last menstrual period 09/18/2011, SpO2 96 %.    Vent Mode: PRVC FiO2 (%):  [30 %-40 %] 40 % Set Rate:  [18 bmp] 18 bmp Vt Set:  [430 mL] 430 mL PEEP:  [5 cmH20] 5 cmH20 Pressure Support:  [10 cmH20] 10 cmH20 Plateau Pressure:  [18 cmH20-21 cmH20] 21 cmH20   Intake/Output Summary (Last 24 hours) at 02/18/2020 1327 Last data filed at 02/18/2020 1100 Gross per 24 hour  Intake 1637.64 ml  Output --  Net 1637.64 ml   Filed Weights   02/15/20 0434 02/16/20 0500 02/17/20 0446  Weight: 93.6 kg 91.3 kg 90 kg    Examination: General: Obese, poorly responsive, ventilated HEENT: ET tube in place, oropharynx moist, pupils equal Neuro: Minimal response, some grimace to pain, does not open eyes or follow commands CV: Regular, distant, no murmur PULM: Bilateral inspiratory crackles, no wheeze GI: Nondistended, positive bowel sounds Extremities: No edema Skin: 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  Decubitus wound noted on hip  Resolved Hospital Problem list     Assessment & Plan:  PEA Arrest presumably due to Septic Shock that was present on admission Back on low-dose norepinephrine, attempting to wean.  Goal SBP 90. Midodrine 20 mg 3 times daily Follow telemetry Treating sepsis as below Work on transitioning her sedating medications to avoid hemodynamic effects  Acute Hypoxic Respiratory Failure  HCAP Has intermittently tolerated some pressure support.  Mental status is a limiting factor.  Continue to try PSV. VAP  prevention orders Follow intermittent chest x-ray Plan to transition sedation to Precedex and see if she gets more benefit. Treating HCAP, Zosyn day 5/7 on 2/9  Anoxic encephalopathy was ruled out Mental status is waxed and waned but she has intermittently follow commands, been awake.  Not currently.   Plan to transition her propofol to Precedex  2/9  ESRD on HD CVVHD stopped on 2/7 Hopefully will be able to attempt intermittent HD in the next 24 hours if we can maintain adequate hemodynamics, wean norepinephrine to off. Reviewed with Dr. Jonnie Finner with nephrology today 2/9.  T2DM Sliding-scale insulin CBG q4hr   Best practice (evaluated daily)  Diet: NPO, tube feeds Pain/Anxiety/Delirium protocol (if indicated): As needed  VAP protocol (if indicated): In place  DVT prophylaxis: Heparin  GI prophylaxis: PPI Glucose control: SSI Mobility: Bedrest Disposition:ICU  Code Status: Full  Family: Updated husband by phone 2/8.   Labs   CBC: Recent Labs  Lab 02/12/20 0430 02/13/20 0204 02/13/20 1505 02/13/20 1527 02/16/20 0910 02/17/20 0649  WBC 42.8* 37.8* 60.5*  --  40.7* 40.4*  NEUTROABS 35.2*  --  53.2*  --   --   --   HGB 10.2* 10.0* 10.6* 10.5* 10.5* 10.2*  HCT 33.2* 32.6* 35.3* 31.0* 35.8* 33.9*  MCV 104.1* 104.5* 108.3*  --  105.6* 106.3*  PLT 327 294 374  --  281 390    Basic Metabolic Panel: Recent Labs  Lab 02/14/20 1619 02/15/20 0428 02/15/20 1600 02/16/20 0215 02/16/20 1552 02/17/20 0649 02/17/20 1628 02/18/20 0522  NA 130* 134* 135 134*  --  135  --   --   K 5.4* 5.0 4.9 4.5  --  4.3  --   --   CL 96* 97* 99 96*  --  96*  --   --   CO2 20* 23 23 22   --  24  --   --   GLUCOSE 132* 152* 172* 181*  --  211*  --   --   BUN 29* 25* 20 17  --  28*  --   --   CREATININE 5.77* 4.52* 3.47* 2.94*  --  3.75*  --   --   CALCIUM 9.1 9.6 9.9 10.0  --  10.8*  --   --   MG  --  2.2  --  2.3 2.4 2.6* 2.7* 2.5*  PHOS 4.3 3.8 3.9 3.5 4.3 4.2 4.5 4.8*   GFR: Estimated Creatinine Clearance: 17.1 mL/min (A) (by C-G formula based on SCr of 3.75 mg/dL (H)). Recent Labs  Lab 02/26/2020 1657 02/12/20 0430 02/13/20 0204 02/13/20 1505 02/13/20 1826 02/13/20 2116 02/16/20 0910 02/17/20 0649  PROCALCITON  --   --   --   --  92.99  --   --   --   WBC  --    < > 37.8* 60.5*  --   --  40.7* 40.4*  LATICACIDVEN  1.6  --   --  5.8*  --  2.1*  --   --    < > = values in this interval not displayed.    Liver Function Tests: Recent Labs  Lab 02/13/20 1505 02/14/20 0232 02/14/20 1619 02/15/20 0428 02/15/20 1600 02/16/20 0215  AST 27  --   --   --   --   --   ALT 18  --   --   --   --   --   ALKPHOS 179*  --   --   --   --   --  BILITOT 0.8  --   --   --   --   --   PROT 6.3*  --   --   --   --   --   ALBUMIN 2.1* 2.1* 1.9* 2.0* 1.9* 2.0*   No results for input(s): LIPASE, AMYLASE in the last 168 hours. No results for input(s): AMMONIA in the last 168 hours.  ABG    Component Value Date/Time   PHART 7.415 02/13/2020 1527   PCO2ART 39.9 02/13/2020 1527   PO2ART 173 (H) 02/13/2020 1527   HCO3 25.7 02/13/2020 1527   TCO2 27 02/13/2020 1527   ACIDBASEDEF 2.1 (H) 11/12/2019 0125   O2SAT 100.0 02/13/2020 1527     Coagulation Profile: No results for input(s): INR, PROTIME in the last 168 hours.  Cardiac Enzymes: No results for input(s): CKTOTAL, CKMB, CKMBINDEX, TROPONINI in the last 168 hours.  HbA1C: HbA1c, POC (controlled diabetic range)  Date/Time Value Ref Range Status  03/11/2019 10:20 AM 7.7 (A) 0.0 - 7.0 % Final  08/23/2017 08:43 AM 9.1 (A) 0.0 - 7.0 % Final   Hgb A1c MFr Bld  Date/Time Value Ref Range Status  02/12/2020 04:30 AM 5.5 4.8 - 5.6 % Final    Comment:    (NOTE) Pre diabetes:          5.7%-6.4%  Diabetes:              >6.4%  Glycemic control for   <7.0% adults with diabetes   11/12/2019 05:10 AM 9.0 (H) 4.8 - 5.6 % Final    Comment:    (NOTE) Pre diabetes:          5.7%-6.4%  Diabetes:              >6.4%  Glycemic control for   <7.0% adults with diabetes     CBG: Recent Labs  Lab 02/17/20 1931 02/17/20 2334 02/18/20 0357 02/18/20 0826 02/18/20 1239  GLUCAP 282* 264* 187* 161* 159*    Total critical care time: 33  minutes   Critical care time was exclusive of separately billable procedures and treating other patients.   Critical care  was necessary to treat or prevent imminent or life-threatening deterioration.   Critical care was time spent personally by me on the following activities: development of treatment plan with patient and/or surrogate as well as nursing, discussions with consultants, evaluation of patient's response to treatment, examination of patient, obtaining history from patient or surrogate, ordering and performing treatments and interventions, ordering and review of laboratory studies, ordering and review of radiographic studies, pulse oximetry and re-evaluation of patient's condition.   Baltazar Apo, MD, PhD 02/18/2020, 1:27 PM [facility] [phone] or if no answer before 7:00PM call [phone] For any issues after 7:00PM please call eLink [phone]

## 2020-02-18 NOTE — Progress Notes (Signed)
[facility] Progress Note  Subjective: seen in ICU, on vent, sedated  Vitals:   02/18/20 0900 02/18/20 1000 02/18/20 1100 02/18/20 1118  BP: (!) 104/43 (!) 93/37 (!) 109/35   Pulse: 94 95  98  Resp: (!) 27 (!) 23 18 (!) 22  Temp:      TempSrc:      SpO2: 94% 93%  96%  Weight:      Height:        Exam:  on vent ,sedated  no jvd  throat ett in place  Chest cta bilat and lat  Cor reg no RG  Abd soft ntnd no ascites   Ext woody bilat thigh/ hip edema 1-2+   Neuro on vent and sedated, not following commands   LUE AVF+bruit     OP HD: [facility]   3.5h  450/800  97kg  2/2 bath  P4  L AVF  Hep 8000 - Sensipar 30 mg PO q HD - Hectoral 11mcg IV q HD - Mircera 100 q 2 weeks - ordered, not yet given, Hgb 10.8 with tsat 9 on 01/28/20    Assessment/ Plan: 1. PEA Arrest - suspected d/t sepsis on admission. Pressors down, getting midodrine and IV abx 2. Acute hypoxic resp failure - d/t COVID pna +/- pulm edema, on vent in ICU. Had remdesivir last admission. CXR better, still pna but no edema.  3. ESRD - on HD MWF.  On CRRT here 2/4- 2/7. Plan bedside HD today, or possibly will be postponed til tomorrow.  4. H/o calciphylaxis - of bilat thighs, chronic issue. 5. BP/ volume - on midodrine, weaning pressors. 6kg under dry wt. Woody edema of legs, doubt this will respond to HD.  6. 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  7. Anemia ckd - Hb 10's, hold on esa for now 8. MBD ckd - cont vdra IV, resume binders/ sensipar when eating     Rob Schertz 02/18/2020, 3:31 PM   Recent Labs  Lab 02/16/20 0215 02/16/20 0910 02/16/20 1552 02/17/20 0649 02/17/20 1628 02/18/20 0522  K 4.5  --   --  4.3  --   --   BUN 17  --   --  28*  --   --   CREATININE 2.94*  --   --  3.75*  --   --   CALCIUM 10.0  --   --  10.8*  --   --   PHOS 3.5  --    < > 4.2 4.5 4.8*  HGB  --  10.5*  --  10.2*  --   --    < > = values in this interval not displayed.    Inpatient medications: . aspirin  81 mg Per Tube Daily  . atorvastatin  80 mg Per Tube QHS  . B-complex with vitamin C  1 tablet Per Tube Daily  . chlorhexidine gluconate (MEDLINE KIT)  15 mL Mouth Rinse BID  . Chlorhexidine Gluconate Cloth  6 each Topical Q0600  . docusate  100 mg Per Tube BID  . feeding supplement (PROSource TF)  90 mL Per Tube TID  . gabapentin  50 mg Per Tube QHS  . heparin  5,000 Units Subcutaneous Q8H  . insulin aspart  0-20 Units Subcutaneous Q4H  . mouth rinse  15 mL Mouth Rinse 10 times per day  . midodrine  20 mg Per Tube TID WC  . polyethylene glycol  17 g Per Tube Daily  .   silver sulfADIAZINE   Topical BID   . sodium chloride Stopped (02/17/20 1636)  . dexmedetomidine (PRECEDEX) IV infusion    . famotidine (PEPCID) IV Stopped (02/17/20 2301)  . feeding supplement (VITAL AF 1.2 CAL) 1,000 mL (02/18/20 0028)  . norepinephrine (LEVOPHED) Adult infusion 7 mcg/min (02/18/20 1407)  . piperacillin-tazobactam (ZOSYN)  IV 12.5 mL/hr at 02/18/20 1100  . propofol (DIPRIVAN) infusion 20 mcg/kg/min (02/18/20 1100)   albuterol, bisacodyl, heparin

## 2020-02-18 NOTE — Progress Notes (Signed)
PCCM Interval Note  Updated patient's husband by phone.   Baltazar Apo, MD, PhD 02/18/2020, 5:38 PM [facility] [phone] or if no answer before 7:00PM call [phone] For any issues after 7:00PM please call eLink [phone]

## 2020-02-19 DIAGNOSIS — J9601 Acute respiratory failure with hypoxia: Secondary | ICD-10-CM | POA: Diagnosis not present

## 2020-02-19 LAB — GLUCOSE, CAPILLARY
Glucose-Capillary: 112 mg/dL — ABNORMAL HIGH (ref 70–99)
Glucose-Capillary: 139 mg/dL — ABNORMAL HIGH (ref 70–99)
Glucose-Capillary: 140 mg/dL — ABNORMAL HIGH (ref 70–99)
Glucose-Capillary: 144 mg/dL — ABNORMAL HIGH (ref 70–99)
Glucose-Capillary: 147 mg/dL — ABNORMAL HIGH (ref 70–99)
Glucose-Capillary: 156 mg/dL — ABNORMAL HIGH (ref 70–99)
Glucose-Capillary: 159 mg/dL — ABNORMAL HIGH (ref 70–99)
Glucose-Capillary: 185 mg/dL — ABNORMAL HIGH (ref 70–99)
Glucose-Capillary: 189 mg/dL — ABNORMAL HIGH (ref 70–99)
Glucose-Capillary: 218 mg/dL — ABNORMAL HIGH (ref 70–99)
Glucose-Capillary: 325 mg/dL — ABNORMAL HIGH (ref 70–99)
Glucose-Capillary: 380 mg/dL — ABNORMAL HIGH (ref 70–99)
Glucose-Capillary: 383 mg/dL — ABNORMAL HIGH (ref 70–99)
Glucose-Capillary: 385 mg/dL — ABNORMAL HIGH (ref 70–99)

## 2020-02-19 LAB — CBC
HCT: 35.6 % — ABNORMAL LOW (ref 36.0–46.0)
Hemoglobin: 10.7 g/dL — ABNORMAL LOW (ref 12.0–15.0)
MCH: 31.2 pg (ref 26.0–34.0)
MCHC: 30.1 g/dL (ref 30.0–36.0)
MCV: 103.8 fL — ABNORMAL HIGH (ref 80.0–100.0)
Platelets: 233 10*3/uL (ref 150–400)
RBC: 3.43 MIL/uL — ABNORMAL LOW (ref 3.87–5.11)
RDW: 18.7 % — ABNORMAL HIGH (ref 11.5–15.5)
WBC: 40.8 10*3/uL — ABNORMAL HIGH (ref 4.0–10.5)
nRBC: 0.2 % (ref 0.0–0.2)

## 2020-02-19 LAB — BASIC METABOLIC PANEL
Anion gap: 18 — ABNORMAL HIGH (ref 5–15)
BUN: 50 mg/dL — ABNORMAL HIGH (ref 8–23)
CO2: 20 mmol/L — ABNORMAL LOW (ref 22–32)
Calcium: 10 mg/dL (ref 8.9–10.3)
Chloride: 96 mmol/L — ABNORMAL LOW (ref 98–111)
Creatinine, Ser: 4.56 mg/dL — ABNORMAL HIGH (ref 0.44–1.00)
GFR, Estimated: 10 mL/min — ABNORMAL LOW (ref >60)
Glucose, Bld: 405 mg/dL — ABNORMAL HIGH (ref 70–99)
Potassium: 4 mmol/L (ref 3.5–5.1)
Sodium: 134 mmol/L — ABNORMAL LOW (ref 135–145)

## 2020-02-19 LAB — MAGNESIUM: Magnesium: 2.3 mg/dL (ref 1.7–2.4)

## 2020-02-19 MED ORDER — DEXTROSE 50 % IV SOLN: 0.0000 mL | INTRAVENOUS | Status: DC | PRN

## 2020-02-19 MED ORDER — INSULIN REGULAR(HUMAN) IN NACL 100-0.9 UT/100ML-% IV SOLN
INTRAVENOUS | Status: AC
  Administered 2020-02-19: 14 [IU]/h via INTRAVENOUS
  Administered 2020-02-19: 2.8 [IU]/h via INTRAVENOUS
  Filled 2020-02-19 (×2): qty 100

## 2020-02-19 NOTE — Progress Notes (Signed)
[facility] Progress Note  Subjective: seen in ICU, on vent, sedated  Vitals:   02/19/20 1200 02/19/20 1204 02/19/20 1516 02/19/20 1523  BP: (!) 89/33     Pulse: 79     Resp: (!) 22     Temp: 97.9 F (36.6 C)   98.8 F (37.1 C)  TempSrc: Axillary   Axillary  SpO2: 95% 95% 95%   Weight:      Height:        Exam:  on vent ,sedated  no jvd  throat ett in place  Chest cta bilat and lat  Cor reg no RG  Abd soft ntnd no ascites   Ext woody bilat thigh/ hip edema 1+   Neuro on vent and sedated   LUE AVF+bruit     OP HD: [facility]   3.5h  450/800  97kg  2/2 bath  P4  L AVF  Hep 8000 - Sensipar 30 mg PO q HD - Hectoral 2mg IV q HD - Mircera 100 q 2 weeks - ordered, not yet given, Hgb 10.8 with tsat 9 on 01/28/20    Assessment/ Plan: 1. PEA Arrest - suspected d/t sepsis on admission. Pressors down, getting midodrine and IV abx 2. Acute hypoxic resp failure - d/t COVID pna +/- pulm edema, on vent in ICU. Had remdesivir last admission. Last CXR better, still pna but no edema.  3. ESRD - on HD MWF.  SP CRRT here 2/4- 2/7. HD yest went okay. HD tomorrow. Still has temp R groin HD cath if need CRRT.  4. H/o calciphylaxis - bilat upper legs, chronic issue. 5. BP/ volume - on midodrine, weaning pressors. 6kg under dry wt.  6. 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  7. Anemia ckd - Hb 10's, hold on esa for now 8. MBD ckd - cont vdra IV, resume binders/ sensipar when eating     RKelly Splinter2/10/2020, 4:04 PM   Recent Labs  Lab 02/17/20 0649 02/17/20 1628 02/18/20 0522 02/19/20 0453  K 4.3  --   --  4.0  BUN 28*  --   --  50*  CREATININE 3.75*  --   --  4.56*  CALCIUM 10.8*  --   --  10.0  PHOS 4.2 4.5 4.8*  --   HGB 10.2*  --   --  10.7*   Inpatient medications: . aspirin  81 mg Per Tube Daily  . atorvastatin  80 mg Per Tube QHS  . B-complex with vitamin C  1 tablet Per Tube Daily  . chlorhexidine gluconate (MEDLINE KIT)   15 mL Mouth Rinse BID  . Chlorhexidine Gluconate Cloth  6 each Topical Q0600  . docusate  100 mg Per Tube BID  . feeding supplement (PROSource TF)  90 mL Per Tube TID  . gabapentin  50 mg Per Tube QHS  . heparin  5,000 Units Subcutaneous Q8H  . mouth rinse  15 mL Mouth Rinse 10 times per day  . midodrine  20 mg Per Tube TID WC  . polyethylene glycol  17 g Per Tube Daily  . silver sulfADIAZINE   Topical BID   . sodium chloride Stopped (02/17/20 1636)  . dexmedetomidine (PRECEDEX) IV infusion 0.4 mcg/kg/hr (02/19/20 1100)  . famotidine (PEPCID) IV Stopped (02/18/20 2141)  . feeding supplement (VITAL AF 1.2 CAL) 1,000 mL (02/18/20 0028)  . insulin 6 mL/hr at 02/19/20 1200  . norepinephrine (LEVOPHED) Adult infusion 7 mcg/min (02/18/20 1407)  .  piperacillin-tazobactam (ZOSYN)  IV 12.5 mL/hr at 02/19/20 1200  . propofol (DIPRIVAN) infusion 10 mcg/kg/min (02/18/20 1400)   albuterol, bisacodyl, dextrose, heparin

## 2020-02-19 NOTE — Progress Notes (Signed)
Rachel Chaney, MRN:  [id number], DOB:  03-Oct-1960, LOS: 43 ADMISSION DATE:  02/16/2020, CONSULTATION DATE:  02/13/2020 REFERRING MD:  FMTS, CHIEF COMPLAINT:  PEA arrest    Brief History:  Rachel Chaney is a 60 y.o. female with a pPMH of ESRD on HD (MWF), Chronic hypoxic respiratory failure, T2DM, HFpEF, HTN, HLD, CAD s/p MI (2020) who presented to [facility] with chronic nonspecific skin pain over her bilateral lower extremities and her hospitalization has been complicated by acute on chronic hypoxic respiratory failure and hypotension.    Patient had PEA cardiac arrest during dialysis on 2/4, she was transferred to ICU  Past Medical History:  ESRD  Type II diabetes  HLD HTN Cardiac arrest  CHF  Significant Hospital Events:  02/02 - Admitted for leg pain found hypoxic and hypotensive.  02/04 - PEA arrest  Consults:  Nephrology  Cardiology  Procedures:    Significant Diagnostic Tests:  01/23 Echo > 60 to 65%  02/02 CXR > Patchy BILATERAL pulmonary infiltrates favor multifocal pneumonia over pulmonary edema.  02/02 CT Abdomen/Pelvis > No acute abnormality. Multiple chronic findings as detailed above, not substantially changed from recent prior study.  02/02 CT left and right femur > No acute osseous abnormality. There is nonspecific lower extremity edema without evidence for an abscess. Findings are nonspecific but can be seen in patients with cellulitis in the appropriate clinical setting. Other differential considerations include chronic venous insufficiency given the presence of subcutaneous calcifications.  Micro Data:  MRSA PCR 2/4 > Blood culture 2/4 > Respiratory 2/4 >  Antimicrobials:  Zosyn 2/4 > Vancomycin 2/2 > 2/7  Interim History / Subjective:  Propofol converted to Precedex on 2/9 Insulin drip started overnight Flexi-Seal in place for diarrhea Underwent hemodialysis 2/9 on low-dose norepinephrine, currently off Precedex 0.4 PSV 10  Objective    Blood pressure (!) 95/24, pulse 75, temperature 97.7 F (36.5 C), temperature source Oral, resp. rate (!) 22, height 5\' 4"  (1.626 m), weight 90.4 kg, last menstrual period 09/18/2011, SpO2 97 %.    Vent Mode: PSV FiO2 (%):  [40 %] 40 % Set Rate:  [18 bmp] 18 bmp Vt Set:  [430 mL] 430 mL PEEP:  [5 cmH20] 5 cmH20 Pressure Support:  [10 cmH20] 10 cmH20 Plateau Pressure:  [19 cmH20-21 cmH20] 19 cmH20   Intake/Output Summary (Last 24 hours) at 02/19/2020 0955 Last data filed at 02/19/2020 0900 Gross per 24 hour  Intake 1818.72 ml  Output 1500 ml  Net 318.72 ml   Filed Weights   02/18/20 1845 02/18/20 2115 02/19/20 0424  Weight: 92 kg 90.5 kg 90.4 kg    Examination: General: Obese woman, ventilated, chronically ill, no distress, intermittently coughing HEENT: ET tube in place, oropharynx moist, pupils equal Neuro: Awake, eyes open, tracks, strong cough, did not follow commands CV: Regular, distant, tachycardic, no murmur PULM: Bilateral inspiratory crackles, decreased at both bases, no wheezing GI: Nondistended, positive bowel sounds Extremities: No edema Skin: 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  Decubitus wound on hip  Resolved Hospital Problem list     Assessment & Plan:  PEA Arrest presumably due to Septic Shock that was present on admission Marginal blood pressure but norepinephrine has now been weaned off.  Goal SBP 90 Midodrine 20 mg 3 times daily Following telemetry Treating sepsis as below  Acute Hypoxic Respiratory Failure  HCAP Push for pressure support now that her mental status is improved on Precedex. VAP prevention order set Following intermittent  chest x-ray HCAP, Zosyn day 6/7 on 2/10  Anoxic encephalopathy was ruled out More awake since changed to Precedex Follow, minimize other sedating medications.  Correct metabolic disarray  ESRD on HD Tolerated intermittent HD on 2/9 Hopefully blood pressure will continue to allow IHD on  her regular schedule  T2DM Sliding scale converted to insulin infusion 2/10 CBG q4hr   Best practice (evaluated daily)  Diet: NPO, tube feeds Pain/Anxiety/Delirium protocol (if indicated): Precedex VAP protocol (if indicated): In place  DVT prophylaxis: Heparin  GI prophylaxis: PPI Glucose control: SSI Mobility: Bedrest Disposition:ICU  Code Status: Full  Family: Updated husband by phone 2/10   Labs   CBC: Recent Labs  Lab 02/13/20 0204 02/13/20 1505 02/13/20 1527 02/16/20 0910 02/17/20 0649 02/19/20 0453  WBC 37.8* 60.5*  --  40.7* 40.4* 40.8*  NEUTROABS  --  53.2*  --   --   --   --   HGB 10.0* 10.6* 10.5* 10.5* 10.2* 10.7*  HCT 32.6* 35.3* 31.0* 35.8* 33.9* 35.6*  MCV 104.5* 108.3*  --  105.6* 106.3* 103.8*  PLT 294 374  --  281 286 557    Basic Metabolic Panel: Recent Labs  Lab 02/15/20 0428 02/15/20 1600 02/16/20 0215 02/16/20 1552 02/17/20 0649 02/17/20 1628 02/18/20 0522 02/19/20 0453  NA 134* 135 134*  --  135  --   --  134*  K 5.0 4.9 4.5  --  4.3  --   --  4.0  CL 97* 99 96*  --  96*  --   --  96*  CO2 23 23 22   --  24  --   --  20*  GLUCOSE 152* 172* 181*  --  211*  --   --  405*  BUN 25* 20 17  --  28*  --   --  50*  CREATININE 4.52* 3.47* 2.94*  --  3.75*  --   --  4.56*  CALCIUM 9.6 9.9 10.0  --  10.8*  --   --  10.0  MG 2.2  --  2.3 2.4 2.6* 2.7* 2.5* 2.3  PHOS 3.8 3.9 3.5 4.3 4.2 4.5 4.8*  --    GFR: Estimated Creatinine Clearance: 14.1 mL/min (A) (by C-G formula based on SCr of 4.56 mg/dL (H)). Recent Labs  Lab 02/13/20 1505 02/13/20 1826 02/13/20 2116 02/16/20 0910 02/17/20 0649 02/19/20 0453  PROCALCITON  --  92.99  --   --   --   --   WBC 60.5*  --   --  40.7* 40.4* 40.8*  LATICACIDVEN 5.8*  --  2.1*  --   --   --     Liver Function Tests: Recent Labs  Lab 02/13/20 1505 02/14/20 0232 02/14/20 1619 02/15/20 0428 02/15/20 1600 02/16/20 0215  AST 27  --   --   --   --   --   ALT 18  --   --   --   --   --   ALKPHOS  179*  --   --   --   --   --   BILITOT 0.8  --   --   --   --   --   PROT 6.3*  --   --   --   --   --   ALBUMIN 2.1* 2.1* 1.9* 2.0* 1.9* 2.0*   No results for input(s): LIPASE, AMYLASE in the last 168 hours. No results for input(s): AMMONIA in the last 168 hours.  ABG  Component Value Date/Time   PHART 7.415 02/13/2020 1527   PCO2ART 39.9 02/13/2020 1527   PO2ART 173 (H) 02/13/2020 1527   HCO3 25.7 02/13/2020 1527   TCO2 27 02/13/2020 1527   ACIDBASEDEF 2.1 (H) 11/12/2019 0125   O2SAT 100.0 02/13/2020 1527     Coagulation Profile: No results for input(s): INR, PROTIME in the last 168 hours.  Cardiac Enzymes: No results for input(s): CKTOTAL, CKMB, CKMBINDEX, TROPONINI in the last 168 hours.  HbA1C: HbA1c, POC (controlled diabetic range)  Date/Time Value Ref Range Status  03/11/2019 10:20 AM 7.7 (A) 0.0 - 7.0 % Final  08/23/2017 08:43 AM 9.1 (A) 0.0 - 7.0 % Final   Hgb A1c MFr Bld  Date/Time Value Ref Range Status  02/12/2020 04:30 AM 5.5 4.8 - 5.6 % Final    Comment:    (NOTE) Pre diabetes:          5.7%-6.4%  Diabetes:              >6.4%  Glycemic control for   <7.0% adults with diabetes   11/12/2019 05:10 AM 9.0 (H) 4.8 - 5.6 % Final    Comment:    (NOTE) Pre diabetes:          5.7%-6.4%  Diabetes:              >6.4%  Glycemic control for   <7.0% adults with diabetes     CBG: Recent Labs  Lab 02/18/20 1937 02/18/20 2327 02/19/20 0342 02/19/20 0727 02/19/20 0827  GLUCAP 143* 287* 385* 380* 383*    Total critical care time: 33  minutes   Critical care time was exclusive of separately billable procedures and treating other patients.   Critical care was necessary to treat or prevent imminent or life-threatening deterioration.   Critical care was time spent personally by me on the following activities: development of treatment plan with patient and/or surrogate as well as nursing, discussions with consultants, evaluation of patient's response  to treatment, examination of patient, obtaining history from patient or surrogate, ordering and performing treatments and interventions, ordering and review of laboratory studies, ordering and review of radiographic studies, pulse oximetry and re-evaluation of patient's condition.   Baltazar Apo, MD, PhD 02/19/2020, 9:55 AM [facility] [phone] or if no answer before 7:00PM call [phone] For any issues after 7:00PM please call eLink [phone]

## 2020-02-20 DIAGNOSIS — J9601 Acute respiratory failure with hypoxia: Secondary | ICD-10-CM | POA: Diagnosis not present

## 2020-02-20 LAB — BASIC METABOLIC PANEL
Anion gap: 17 — ABNORMAL HIGH (ref 5–15)
BUN: 75 mg/dL — ABNORMAL HIGH (ref 8–23)
CO2: 20 mmol/L — ABNORMAL LOW (ref 22–32)
Calcium: 10.6 mg/dL — ABNORMAL HIGH (ref 8.9–10.3)
Chloride: 98 mmol/L (ref 98–111)
Creatinine, Ser: 6.06 mg/dL — ABNORMAL HIGH (ref 0.44–1.00)
GFR, Estimated: 7 mL/min — ABNORMAL LOW (ref >60)
Glucose, Bld: 186 mg/dL — ABNORMAL HIGH (ref 70–99)
Potassium: 3.9 mmol/L (ref 3.5–5.1)
Sodium: 135 mmol/L (ref 135–145)

## 2020-02-20 LAB — GLUCOSE, CAPILLARY
Glucose-Capillary: 177 mg/dL — ABNORMAL HIGH (ref 70–99)
Glucose-Capillary: 170 mg/dL — ABNORMAL HIGH (ref 70–99)
Glucose-Capillary: 168 mg/dL — ABNORMAL HIGH (ref 70–99)
Glucose-Capillary: 184 mg/dL — ABNORMAL HIGH (ref 70–99)
Glucose-Capillary: 223 mg/dL — ABNORMAL HIGH (ref 70–99)
Glucose-Capillary: 184 mg/dL — ABNORMAL HIGH (ref 70–99)
Glucose-Capillary: 177 mg/dL — ABNORMAL HIGH (ref 70–99)
Glucose-Capillary: 215 mg/dL — ABNORMAL HIGH (ref 70–99)
Glucose-Capillary: 260 mg/dL — ABNORMAL HIGH (ref 70–99)
Glucose-Capillary: 208 mg/dL — ABNORMAL HIGH (ref 70–99)
Glucose-Capillary: 191 mg/dL — ABNORMAL HIGH (ref 70–99)

## 2020-02-20 LAB — HEPATIC FUNCTION PANEL
ALT: 29 U/L (ref 0–44)
AST: 33 U/L (ref 15–41)
Albumin: 1.9 g/dL — ABNORMAL LOW (ref 3.5–5.0)
Alkaline Phosphatase: 129 U/L — ABNORMAL HIGH (ref 38–126)
Bilirubin, Direct: 0.2 mg/dL (ref 0.0–0.2)
Indirect Bilirubin: 0.8 mg/dL (ref 0.3–0.9)
Total Bilirubin: 1 mg/dL (ref 0.3–1.2)
Total Protein: 6.7 g/dL (ref 6.5–8.1)

## 2020-02-20 LAB — CBC
HCT: 33.9 % — ABNORMAL LOW (ref 36.0–46.0)
Hemoglobin: 10.8 g/dL — ABNORMAL LOW (ref 12.0–15.0)
MCH: 32.9 pg (ref 26.0–34.0)
MCHC: 31.9 g/dL (ref 30.0–36.0)
MCV: 103.4 fL — ABNORMAL HIGH (ref 80.0–100.0)
Platelets: 297 10*3/uL (ref 150–400)
RBC: 3.28 MIL/uL — ABNORMAL LOW (ref 3.87–5.11)
RDW: 18.9 % — ABNORMAL HIGH (ref 11.5–15.5)
WBC: 53.5 10*3/uL (ref 4.0–10.5)
nRBC: 0.2 % (ref 0.0–0.2)

## 2020-02-20 LAB — MAGNESIUM: Magnesium: 2.4 mg/dL (ref 1.7–2.4)

## 2020-02-20 MED ORDER — INSULIN ASPART 100 UNIT/ML ~~LOC~~ SOLN
7.0000 [IU] | SUBCUTANEOUS | Status: DC
  Administered 2020-02-20: 7 [IU] via SUBCUTANEOUS

## 2020-02-20 MED ORDER — INSULIN ASPART 100 UNIT/ML ~~LOC~~ SOLN
0.0000 [IU] | SUBCUTANEOUS | Status: DC
  Administered 2020-02-20: 4 [IU] via SUBCUTANEOUS
  Administered 2020-02-20: 7 [IU] via SUBCUTANEOUS
  Administered 2020-02-20: 11 [IU] via SUBCUTANEOUS
  Administered 2020-02-21 (×3): 3 [IU] via SUBCUTANEOUS
  Administered 2020-02-21 – 2020-02-22 (×5): 4 [IU] via SUBCUTANEOUS
  Administered 2020-02-22 – 2020-02-23 (×4): 3 [IU] via SUBCUTANEOUS
  Administered 2020-02-24: 15 [IU] via SUBCUTANEOUS
  Administered 2020-02-24: 4 [IU] via SUBCUTANEOUS
  Administered 2020-02-24: 11 [IU] via SUBCUTANEOUS

## 2020-02-20 MED ORDER — FENTANYL CITRATE (PF) 100 MCG/2ML IJ SOLN
50.0000 ug | INTRAMUSCULAR | Status: DC | PRN
  Filled 2020-02-20: qty 2

## 2020-02-20 MED ORDER — POLYETHYLENE GLYCOL 3350 17 G PO PACK: 17.0000 g | PACK | Freq: Every day | ORAL | Status: DC

## 2020-02-20 MED ORDER — FENTANYL CITRATE (PF) 100 MCG/2ML IJ SOLN
50.0000 ug | INTRAMUSCULAR | Status: DC | PRN
  Administered 2020-02-22 (×2): 100 ug via INTRAVENOUS
  Administered 2020-02-23: 50 ug via INTRAVENOUS
  Filled 2020-02-20 (×3): qty 2

## 2020-02-20 MED ORDER — INSULIN DETEMIR 100 UNIT/ML ~~LOC~~ SOLN
22.0000 [IU] | Freq: Two times a day (BID) | SUBCUTANEOUS | Status: DC
  Administered 2020-02-20: 22 [IU] via SUBCUTANEOUS
  Filled 2020-02-20 (×2): qty 0.22

## 2020-02-20 MED ORDER — INSULIN ASPART 100 UNIT/ML ~~LOC~~ SOLN
3.0000 [IU] | SUBCUTANEOUS | Status: DC
  Administered 2020-02-20: 9 [IU] via SUBCUTANEOUS

## 2020-02-20 MED ORDER — DEXTROSE 10 % IV SOLN: INTRAVENOUS | Status: DC | PRN

## 2020-02-20 MED ORDER — DOCUSATE SODIUM 50 MG/5ML PO LIQD: 100.0000 mg | Freq: Two times a day (BID) | ORAL | Status: DC

## 2020-02-20 NOTE — Procedures (Signed)
Extubation Procedure Note  Patient Details:   Name: Rachel Chaney DOB: 03-Oct-1960 MRN: [id number]   Airway Documentation:    Vent end date: 02/20/20 Vent end time: 1125   Evaluation  O2 sats: stable throughout Complications: No apparent complications Patient did tolerate procedure well. Bilateral Breath Sounds: Clear,Diminished   Yes, pt could speak post extubation. Pt extubated to 4 l/m [place] per physician's order.  Earney Navy 02/20/2020, 11:27 AM

## 2020-02-20 NOTE — Progress Notes (Signed)
SLP Cancellation Note  Patient Details Name: Rachel Chaney MRN: [id number] DOB: February 01, 1960   Cancelled treatment:       Reason Eval/Treat Not Completed: Patient not medically ready. Pt just extubated and RN not available. Will f/u tomorrow if needed.    Sakai Wolford, Katherene Ponto 02/20/2020, 1:26 PM

## 2020-02-20 NOTE — Progress Notes (Signed)
[facility] Progress Note  Subjective: seen in ICU, on vent, sedated  Vitals:   02/20/20 0700 02/20/20 0800 02/20/20 0822 02/20/20 0900  BP: (!) 89/33 (!) 117/30  (!) 119/39  Pulse: 83 84  85  Resp: (!) 22 (!) 21  (!) 23  Temp:  98.3 F (36.8 C)    TempSrc:  Axillary    SpO2: 99% 99% 96% 96%  Weight:      Height:        Exam:  on vent ,sedated  no jvd  throat ett in place  Chest cta bilat and lat  Cor reg no RG  Abd soft ntnd no ascites   Ext woody bilat thigh/ hip edema 1+   Neuro on vent and sedated   LUE AVF+bruit     OP HD: [facility]   3.5h  450/800  97kg  2/2 bath  P4  L AVF  Hep 8000 - Sensipar 30 mg PO q HD - Hectoral 13mg IV q HD - Mircera 100 q 2 weeks - ordered, not yet given, Hgb 10.8 with tsat 9 on 01/28/20    Assessment/ Plan: 1. PEA Arrest - suspected d/t sepsis on admission. Pressors low-dose, getting midodrine and IV abx 2. Acute hypoxic resp failure - d/t COVID pna +/- pulm edema, on vent in ICU. Had remdesivir last admission. Last CXR better, still pna but no edema.  3. ESRD - on HD MWF.  SP CRRT here 2/4- 2/7. HD today. Has temp R groin HD cath if need CRRT again.  4. H/o calciphylaxis - bilat upper legs, chronic issue. 5. BP/ volume - on midodrine, low dose levo pressors. 6kg under dry wt.  6. 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  7. 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 aspirin  81 mg Per Tube Daily  . atorvastatin  80 mg Per Tube QHS  . B-complex with vitamin C  1 tablet Per Tube Daily  . chlorhexidine gluconate (MEDLINE KIT)  15 mL Mouth Rinse BID  .  Chlorhexidine Gluconate Cloth  6 each Topical Q0600  . docusate  100 mg Per Tube BID  . feeding supplement (PROSource TF)  90 mL Per Tube TID  . gabapentin  50 mg Per Tube QHS  . heparin  5,000 Units Subcutaneous Q8H  . insulin aspart  3-9 Units Subcutaneous Q4H  . insulin aspart  7 Units Subcutaneous Q4H  . insulin detemir  22 Units Subcutaneous Q12H  . mouth rinse  15 mL Mouth Rinse 10 times per day  . midodrine  20 mg Per Tube TID WC  . polyethylene glycol  17 g Per Tube Daily  . silver sulfADIAZINE   Topical BID   . sodium chloride Stopped (02/17/20 1636)  . dexmedetomidine (PRECEDEX) IV infusion 0.4 mcg/kg/hr (02/20/20 0800)  . dextrose    . famotidine (PEPCID) IV Stopped (02/19/20 2216)  . feeding  supplement (VITAL AF 1.2 CAL) 1,000 mL (02/18/20 0028)  . insulin 4.8 mL/hr at 02/20/20 0800  . norepinephrine (LEVOPHED) Adult infusion 3 mcg/min (02/19/20 2253)  . piperacillin-tazobactam (ZOSYN)  IV 3.375 g (02/20/20 0910)  . propofol (DIPRIVAN) infusion 10 mcg/kg/min (02/18/20 1400)   albuterol, bisacodyl, dextrose, dextrose, fentaNYL (SUBLIMAZE) injection, fentaNYL (SUBLIMAZE) injection, heparin

## 2020-02-20 NOTE — Progress Notes (Signed)
eLink Physician-Brief Progress Note Patient Name: Rachel Chaney DOB: Apr 09, 1960 MRN: [id number]   Date of Service  02/20/2020  HPI/Events of Note  Patient is having some mild-moderate discomfort. Ventilated. Coughing. Frequent suctioning needing to be performed due to secretions.   eICU Interventions  Ordered fentanyl IV pushes PRN pain.     Intervention Category Intermediate Interventions: Pain - evaluation and management  Marily Lente [name] 02/20/2020, 12:07 AM

## 2020-02-20 NOTE — Progress Notes (Signed)
[name], MRN:  [id number], DOB:  [birth year], LOS: 28 ADMISSION DATE:  02/21/2020, CONSULTATION DATE:  02/21/2020 REFERRING MD:  Rachel Chaney, CHIEF COMPLAINT:  PEA arrest    Brief History:  Rachel Chaney is a 60 y.o. female with a pPMH of ESRD on HD (MWF), Chronic hypoxic respiratory failure, T2DM, HFpEF, HTN, HLD, CAD s/p MI (2020) who presented to [facility] with chronic nonspecific skin pain over her bilateral lower extremities and her hospitalization has been complicated by acute on chronic hypoxic respiratory failure and hypotension.    Patient had PEA cardiac arrest during dialysis on 2/4, she was transferred to ICU  Past Medical History:  ESRD  Type II diabetes  HLD HTN Cardiac arrest  CHF  Significant Hospital Events:  02/02 - Admitted for leg pain found hypoxic and hypotensive.  02/04 - PEA arrest  Consults:  Nephrology  Cardiology  Procedures:    Significant Diagnostic Tests:  01/23 Echo > 60 to 65%  02/02 CXR > Patchy BILATERAL pulmonary infiltrates favor multifocal pneumonia over pulmonary edema.  02/02 CT Abdomen/Pelvis > No acute abnormality. Multiple chronic findings as detailed above, not substantially changed from recent prior study.  02/02 CT left and right femur > No acute osseous abnormality. There is nonspecific lower extremity edema without evidence for an abscess. Findings are nonspecific but can be seen in patients with cellulitis in the appropriate clinical setting. Other differential considerations include chronic venous insufficiency given the presence of subcutaneous calcifications.  Micro Data:  MRSA PCR 2/4 > Blood culture 2/4 > Respiratory 2/4 >  Antimicrobials:  Zosyn 2/4 > Vancomycin 2/2 > 2/7  Interim History / Subjective:   Precedex weaned off this morning Remains on insulin drip but likely can be transitioned off Norepinephrine 3 0.50, currently on pressure support ventilation Scheduled for hemodialysis later today  Objective    Blood pressure (!) 119/39, pulse 85, temperature 98.3 F (36.8 C), temperature source Axillary, resp. rate (!) 23, height 5\' 4"  (1.626 m), weight 90.4 kg, last menstrual period 09/18/2011, SpO2 96 %.    Vent Mode: PSV;CPAP FiO2 (%):  [40 %-50 %] 40 % Set Rate:  [15 bmp] 15 bmp Vt Set:  [350 mL] 350 mL PEEP:  [5 cmH20-7 cmH20] 7 cmH20 Pressure Support:  [10 cmH20] 10 cmH20 Plateau Pressure:  [13 cmH20-20 cmH20] 13 cmH20   Intake/Output Summary (Last 24 hours) at 02/20/2020 1045 Last data filed at 02/20/2020 0900 Gross per 24 hour  Intake 1466.68 ml  Output 0 ml  Net 1466.68 ml   Filed Weights   02/18/20 1845 02/18/20 2115 02/19/20 0424  Weight: 92 kg 90.5 kg 90.4 kg    Examination: General: Obese woman, ventilated, chronically ill, no distress HEENT: ET tube in place, oropharynx moist, pupils equal Neuro: She wakes up easily to voice, will track, follows commands, moderate cough strength.  She was not able to lift her head up off the pillow CV: Regular, distant, no murmur PULM: Scattered bilateral inspiratory crackles, decreased at both bases GI: Nondistended, positive bowel sounds Extremities: No edema Skin: 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  Decubitus wound on hip  Resolved Hospital Problem list     Assessment & Plan:  PEA Arrest presumably due to Septic Shock that was present on admission Blood pressures been marginal, multifactorial.  Difficult to get good cuff pressures from anywhere other than her distal lower extremity.  Weaning norepinephrine as able Midodrine 20 mg 3 times daily Telemetry Treating sepsis Have minimize  sedation  Acute Hypoxic Respiratory Failure  HCAP Pressure support ventilation.  Goal to extubate today 2/11 if tolerated VAP prevention order set Follow intermittent chest x-ray HCAP, Zosyn day 7/7 on 2/11  Anoxic encephalopathy was ruled out Precedex weaned, she is globally weak but encephalopathy improved Minimize  other sedating medications Continue to correct metabolic disarray  ESRD on HD Goal for intermittent HD later today 2/11.  She is on/off norepinephrine Avoiding CVVHD  T2DM Plan to convert insulin fusion back to [employer] 2/11 CBG q4hr   Best practice (evaluated daily)  Diet: NPO, tube feeds Pain/Anxiety/Delirium protocol (if indicated): Precedex VAP protocol (if indicated): In place  DVT prophylaxis: Heparin  GI prophylaxis: PPI Glucose control: SSI Mobility: Bedrest Disposition:ICU  Code Status: Full  Family: Updated husband by phone 2/11  Labs   CBC: Recent Labs  Lab 02/13/20 1505 02/13/20 1527 02/16/20 0910 02/17/20 0649 02/19/20 0453 02/20/20 0419  WBC 60.5*  --  40.7* 40.4* 40.8* 53.5*  NEUTROABS 53.2*  --   --   --   --   --   HGB 10.6* 10.5* 10.5* 10.2* 10.7* 10.8*  HCT 35.3* 31.0* 35.8* 33.9* 35.6* 33.9*  MCV 108.3*  --  105.6* 106.3* 103.8* 103.4*  PLT 374  --  281 286 233 295    Basic Metabolic Panel: Recent Labs  Lab 02/15/20 1600 02/16/20 0215 02/16/20 1552 02/17/20 0649 02/17/20 1628 02/18/20 0522 02/19/20 0453 02/20/20 0419  NA 135 134*  --  135  --   --  134* 135  K 4.9 4.5  --  4.3  --   --  4.0 3.9  CL 99 96*  --  96*  --   --  96* 98  CO2 23 22  --  24  --   --  20* 20*  GLUCOSE 172* 181*  --  211*  --   --  405* 186*  BUN 20 17  --  28*  --   --  50* 75*  CREATININE 3.47* 2.94*  --  3.75*  --   --  4.56* 6.06*  CALCIUM 9.9 10.0  --  10.8*  --   --  10.0 10.6*  MG  --  2.3 2.4 2.6* 2.7* 2.5* 2.3 2.4  PHOS 3.9 3.5 4.3 4.2 4.5 4.8*  --   --    GFR: Estimated Creatinine Clearance: 10.6 mL/min (A) (by C-G formula based on SCr of 6.06 mg/dL (H)). Recent Labs  Lab 02/13/20 1505 02/13/20 1826 02/13/20 2116 02/16/20 0910 02/17/20 0649 02/19/20 0453 02/20/20 0419  PROCALCITON  --  92.99  --   --   --   --   --   WBC 60.5*  --   --  40.7* 40.4* 40.8* 53.5*  LATICACIDVEN 5.8*  --  2.1*  --   --   --   --     Liver Function  Tests: Recent Labs  Lab 02/13/20 1505 02/14/20 0232 02/14/20 1619 02/15/20 0428 02/15/20 1600 02/16/20 0215 02/20/20 0419  AST 27  --   --   --   --   --  33  ALT 18  --   --   --   --   --  29  ALKPHOS 179*  --   --   --   --   --  129*  BILITOT 0.8  --   --   --   --   --  1.0  PROT 6.3*  --   --   --   --   --  6.7  ALBUMIN 2.1*   < > 1.9* 2.0* 1.9* 2.0* 1.9*   < > = values in this interval not displayed.   No results for input(s): LIPASE, AMYLASE in the last 168 hours. No results for input(s): AMMONIA in the last 168 hours.  ABG    Component Value Date/Time   PHART 7.415 02/13/2020 1527   PCO2ART 39.9 02/13/2020 1527   PO2ART 173 (H) 02/13/2020 1527   HCO3 25.7 02/13/2020 1527   TCO2 27 02/13/2020 1527   ACIDBASEDEF 2.1 (H) 11/12/2019 0125   O2SAT 100.0 02/13/2020 1527     Coagulation Profile: No results for input(s): INR, PROTIME in the last 168 hours.  Cardiac Enzymes: No results for input(s): CKTOTAL, CKMB, CKMBINDEX, TROPONINI in the last 168 hours.  HbA1C: HbA1c, POC (controlled diabetic range)  Date/Time Value Ref Range Status  03/11/2019 10:20 AM 7.7 (A) 0.0 - 7.0 % Final  08/23/2017 08:43 AM 9.1 (A) 0.0 - 7.0 % Final   Hgb A1c MFr Bld  Date/Time Value Ref Range Status  02/12/2020 04:30 AM 5.5 4.8 - 5.6 % Final    Comment:    (NOTE) Pre diabetes:          5.7%-6.4%  Diabetes:              >6.4%  Glycemic control for   <7.0% adults with diabetes   11/12/2019 05:10 AM 9.0 (H) 4.8 - 5.6 % Final    Comment:    (NOTE) Pre diabetes:          5.7%-6.4%  Diabetes:              >6.4%  Glycemic control for   <7.0% adults with diabetes     CBG: Recent Labs  Lab 02/20/20 0343 02/20/20 0555 02/20/20 0756 02/20/20 0859 02/20/20 1005  GLUCAP 170* 168* 184* 223* 184*    Total critical care time: 35  minutes   Critical care time was exclusive of separately billable procedures and treating other patients.   Critical care was necessary to  treat or prevent imminent or life-threatening deterioration.   Critical care was time spent personally by me on the following activities: development of treatment plan with patient and/or surrogate as well as nursing, discussions with consultants, evaluation of patient's response to treatment, examination of patient, obtaining history from patient or surrogate, ordering and performing treatments and interventions, ordering and review of laboratory studies, ordering and review of radiographic studies, pulse oximetry and re-evaluation of patient's condition.   Baltazar Apo, MD, PhD 02/20/2020, 10:45 AM [facility] [phone] or if no answer before 7:00PM call [phone] For any issues after 7:00PM please call eLink [phone]

## 2020-02-21 ENCOUNTER — Inpatient Hospital Stay (HOSPITAL_COMMUNITY): Payer: Medicare Other

## 2020-02-21 ENCOUNTER — Inpatient Hospital Stay: Payer: Self-pay

## 2020-02-21 DIAGNOSIS — R579 Shock, unspecified: Secondary | ICD-10-CM | POA: Diagnosis not present

## 2020-02-21 LAB — CBC
HCT: 34 % — ABNORMAL LOW (ref 36.0–46.0)
Hemoglobin: 10.1 g/dL — ABNORMAL LOW (ref 12.0–15.0)
MCH: 31.2 pg (ref 26.0–34.0)
MCHC: 29.7 g/dL — ABNORMAL LOW (ref 30.0–36.0)
MCV: 104.9 fL — ABNORMAL HIGH (ref 80.0–100.0)
Platelets: 290 10*3/uL (ref 150–400)
RBC: 3.24 MIL/uL — ABNORMAL LOW (ref 3.87–5.11)
RDW: 18.9 % — ABNORMAL HIGH (ref 11.5–15.5)
WBC: 55.7 10*3/uL (ref 4.0–10.5)
nRBC: 0.2 % (ref 0.0–0.2)

## 2020-02-21 LAB — BASIC METABOLIC PANEL
Anion gap: 23 — ABNORMAL HIGH (ref 5–15)
BUN: 95 mg/dL — ABNORMAL HIGH (ref 8–23)
CO2: 17 mmol/L — ABNORMAL LOW (ref 22–32)
Calcium: 10.4 mg/dL — ABNORMAL HIGH (ref 8.9–10.3)
Chloride: 96 mmol/L — ABNORMAL LOW (ref 98–111)
Creatinine, Ser: 7.24 mg/dL — ABNORMAL HIGH (ref 0.44–1.00)
GFR, Estimated: 6 mL/min — ABNORMAL LOW (ref >60)
Glucose, Bld: 191 mg/dL — ABNORMAL HIGH (ref 70–99)
Potassium: 4.7 mmol/L (ref 3.5–5.1)
Sodium: 136 mmol/L (ref 135–145)

## 2020-02-21 LAB — POCT I-STAT 7, (LYTES, BLD GAS, ICA,H+H)
Acid-base deficit: 7 mmol/L — ABNORMAL HIGH (ref 0.0–2.0)
Bicarbonate: 19.4 mmol/L — ABNORMAL LOW (ref 20.0–28.0)
Calcium, Ion: 1.29 mmol/L (ref 1.15–1.40)
HCT: 32 % — ABNORMAL LOW (ref 36.0–46.0)
Hemoglobin: 10.9 g/dL — ABNORMAL LOW (ref 12.0–15.0)
O2 Saturation: 89 %
Patient temperature: 99.8
Potassium: 4.4 mmol/L (ref 3.5–5.1)
Sodium: 133 mmol/L — ABNORMAL LOW (ref 135–145)
TCO2: 21 mmol/L — ABNORMAL LOW (ref 22–32)
pCO2 arterial: 41.2 mmHg (ref 32.0–48.0)
pH, Arterial: 7.284 — ABNORMAL LOW (ref 7.350–7.450)
pO2, Arterial: 66 mmHg — ABNORMAL LOW (ref 83.0–108.0)

## 2020-02-21 LAB — GLUCOSE, CAPILLARY
Glucose-Capillary: 123 mg/dL — ABNORMAL HIGH (ref 70–99)
Glucose-Capillary: 126 mg/dL — ABNORMAL HIGH (ref 70–99)
Glucose-Capillary: 143 mg/dL — ABNORMAL HIGH (ref 70–99)
Glucose-Capillary: 183 mg/dL — ABNORMAL HIGH (ref 70–99)
Glucose-Capillary: 186 mg/dL — ABNORMAL HIGH (ref 70–99)
Glucose-Capillary: 197 mg/dL — ABNORMAL HIGH (ref 70–99)

## 2020-02-21 LAB — PHOSPHORUS: Phosphorus: 8 mg/dL — ABNORMAL HIGH (ref 2.5–4.6)

## 2020-02-21 LAB — MAGNESIUM: Magnesium: 2.6 mg/dL — ABNORMAL HIGH (ref 1.7–2.4)

## 2020-02-21 LAB — TROPONIN I (HIGH SENSITIVITY): Troponin I (High Sensitivity): 277 ng/L (ref <18)

## 2020-02-21 LAB — CORTISOL: Cortisol, Plasma: 31.6 ug/dL

## 2020-02-21 LAB — LACTIC ACID, PLASMA: Lactic Acid, Venous: 1.7 mmol/L (ref 0.5–1.9)

## 2020-02-21 MED ORDER — HEPARIN SODIUM (PORCINE) 1000 UNIT/ML DIALYSIS
1000.0000 [IU] | INTRAMUSCULAR | Status: DC | PRN
Start: 2020-02-21 — End: 2020-02-23
  Filled 2020-02-21: qty 6

## 2020-02-21 MED ORDER — SODIUM CHLORIDE 0.9 % FOR CRRT: INTRAVENOUS_CENTRAL | Status: DC | PRN

## 2020-02-21 MED ORDER — PRISMASOL BGK 4/2.5 32-4-2.5 MEQ/L REPLACEMENT SOLN
Status: DC
  Filled 2020-02-21 (×5): qty 5000

## 2020-02-21 MED ORDER — PHENYLEPHRINE HCL-NACL 10-0.9 MG/250ML-% IV SOLN
25.0000 ug/min | INTRAVENOUS | Status: DC
  Administered 2020-02-21: 25 ug/min via INTRAVENOUS
  Administered 2020-02-21 (×2): 40 ug/min via INTRAVENOUS
  Filled 2020-02-21 (×3): qty 250

## 2020-02-21 MED ORDER — PRISMASOL BGK 4/2.5 32-4-2.5 MEQ/L EC SOLN
Status: DC
  Filled 2020-02-21 (×23): qty 5000

## 2020-02-21 MED ORDER — SODIUM CHLORIDE 0.9 % IV SOLN: 250.0000 mL | INTRAVENOUS | Status: DC

## 2020-02-21 MED ORDER — HYDROCORTISONE NA SUCCINATE PF 100 MG IJ SOLR
50.0000 mg | Freq: Four times a day (QID) | INTRAMUSCULAR | Status: DC
  Administered 2020-02-21 – 2020-02-22 (×5): 50 mg via INTRAVENOUS
  Filled 2020-02-21 (×5): qty 2

## 2020-02-21 MED ORDER — ALTEPLASE 2 MG IJ SOLR: 2.0000 mg | Freq: Once | INTRAMUSCULAR | Status: DC | PRN

## 2020-02-21 MED ORDER — PRISMASOL BGK 4/2.5 32-4-2.5 MEQ/L REPLACEMENT SOLN
Status: DC
  Filled 2020-02-21 (×3): qty 5000

## 2020-02-21 NOTE — Procedures (Signed)
Arterial Catheter Insertion Procedure Note  LAIA WILEY  [id number]  13-Sep-1960  Date:02/21/20  Time:8:42 AM    Provider Performing: Judith Part    Procedure: Insertion of Arterial Line [phone]) without US guidance  Indication(s) Blood pressure monitoring and/or need for frequent ABGs  Consent Risks of the procedure as well as the alternatives and risks of each were explained to the patient and/or caregiver.  Consent for the procedure was obtained and is signed in the bedside chart  Anesthesia None   Time Out Verified patient identification, verified procedure, site/side was marked, verified correct patient position, special equipment/implants available, medications/allergies/relevant history reviewed, required imaging and test results available.   Sterile Technique Maximal sterile technique including full sterile barrier drape, hand hygiene, sterile gown, sterile gloves, mask, hair covering, sterile ultrasound probe cover (if used).   Procedure Description Area of catheter insertion was cleaned with chlorhexidine and draped in sterile fashion. Without real-time ultrasound guidance an arterial catheter was placed into the right radial artery.  Appropriate arterial tracings confirmed on monitor.     Complications/Tolerance None; patient tolerated the procedure well.   EBL Minimal   Specimen(s) None

## 2020-02-21 NOTE — Progress Notes (Signed)
Spoke with Mendel Ryder, RN regarding PICC order.  PICC RN has paged Dr. Jonnie Finner for authorization since patient is an active dialysis patient.  Patient chart also shows SBP in the 50s, patient will need to be stabilized prior to PICC placement.  Per Mendel Ryder, RN patient's BP is actually higher but a-line information is not transferring to chart.  Recommendation is for CVL to be placed if unable to wait until authorization is obtained and if patient is not stable.

## 2020-02-21 NOTE — Progress Notes (Signed)
Spoke with dr Jonnie Finner re PICC order.  States he will d/w Dr Chase Caller today.

## 2020-02-21 NOTE — Progress Notes (Signed)
SLP Cancellation Note  Patient Details Name: Rachel Chaney MRN: [id number] DOB: [birth year]   Cancelled treatment:       Reason Eval/Treat Not Completed: Patient not medically ready. Will f/u    Maelie Chriswell, Katherene Ponto 02/21/2020, 8:36 AM

## 2020-02-21 NOTE — Progress Notes (Signed)
eLink Physician-Brief Progress Note Patient Name: Rachel Chaney DOB: 11/06/1960 MRN: [id number]   Date of Service  02/21/2020  HPI/Events of Note  Patient with persistent hypotension ( as low as 75/29 with MAP 39) despite being on Midodrine and maximum dose of 10 mcg of peripheral Norepinephrine. Patient has normal LV function with an EF of 60-65 %.  eICU Interventions  Peripheral Phenylephrine  (0 to 200 mcg) added to target a MAP of 65 mmHg.        Kerry Kass Rorey Hodges 02/21/2020, 3:52 AM

## 2020-02-21 NOTE — Progress Notes (Addendum)
Rachel Chaney, MRN:  [id number], DOB:  1960-07-27, LOS: 20 ADMISSION DATE:  02/21/2020, CONSULTATION DATE:  02/20/2020 REFERRING MD:  FMTS, CHIEF COMPLAINT:  PEA arrest    Brief History:  Rachel Chaney is a 60 y.o. female with a pPMH of ESRD on HD (MWF), Chronic hypoxic respiratory failure, T2DM, HFpEF, HTN, HLD, CAD s/p MI (2020) who presented to [facility] with chronic nonspecific skin pain over her bilateral lower extremities and her hospitalization has been complicated by acute on chronic hypoxic respiratory failure and hypotension.   Patient had PEA cardiac arrest during dialysis on 2/4, she was transferred to ICU. She was covid positive prior to this admit on 01/31/20  Past Medical History:  ESRD  Type II diabetes  HLD HTN Cardiac arrest  CHF  Significant Hospital Events:  02/02 - Admitted for leg pain found hypoxic and hypotensive.  02/04 - PEA arrest xxxxxxx 2./11 - Precedex weaned off this morning Remains on insulin drip but likely can be transitioned off Norepinephrine 3. 0.50, currently on pressure support ventilation. Scheduled for hemodialysis later today    Consults:  Nephrology  Cardiology  Procedures:   ? Date ETT > 2/11  Significant Diagnostic Tests:  01/23 Echo > 60 to 65%  02/02 CXR > Patchy BILATERAL pulmonary infiltrates favor multifocal pneumonia over pulmonary edema.  02/02 CT Abdomen/Pelvis > No acute abnormality. Multiple chronic findings as detailed above, not substantially changed from recent prior study.  02/02 CT left and right femur > No acute osseous abnormality. There is nonspecific lower extremity edema without evidence for an abscess. Findings are nonspecific but can be seen in patients with cellulitis in the appropriate clinical setting. Other differential considerations include chronic venous insufficiency given the presence of subcutaneous calcifications.  Micro Data:  01/31/20 - covid positive xxxx MRSA PCR 2/4 > neg Blood culture 2/4  > neg Respiratory 2/4 > neg  Antimicrobials:  Zosyn 2/4 > Vancomycin 2/2 > 2/7  Interim History / Subjective:   2/12 - remains extubated  since yesterday.  Overnight  Needing pressors.  Ongoping challenges with measuring BP -> being measured RLE. [place] -o2 -followgn some commands. Deconditioned and weak. Difffuse pain per RN  Objective   Blood pressure (!) 102/49, pulse (!) 125, temperature 99.8 F (37.7 C), temperature source Axillary, resp. rate (!) 28, height 5\' 4"  (1.626 m), weight 92.4 kg, last menstrual period 09/18/2011, SpO2 (!) 69 %.    Vent Mode: PSV;CPAP FiO2 (%):  [40 %-50 %] 40 % Pressure Support:  [5 cmH20-10 cmH20] 5 cmH20   Intake/Output Summary (Last 24 hours) at 02/21/2020 0744 Last data filed at 02/21/2020 1696 Gross per 24 hour  Intake 851.28 ml  Output 250 ml  Net 601.28 ml   Filed Weights   02/18/20 2115 02/19/20 0424 02/21/20 0500  Weight: 90.5 kg 90.4 kg 92.4 kg      General Appearance:  Looks  OBESE - and deconditioned Head:  Normocephalic, without obvious abnormality, atraumatic Eyes:  PERRL - yes, conjunctiva/corneas - muddy     Ears:  Normal external ear canals, both ears Nose:  G tube - no but has  Throat:  ETT TUBE - no , OG tube - no Neck:  Supple,  No enlargement/tenderness/nodules Lungs: Clear to auscultation bilaterally, Distant breath sound Heart:  S1 and S2 normal, no murmur, CVP - x.  Pressors - yes Abdomen:  Soft, no masses, no organomegaly Genitalia / Rectal:  Not done Extremities:  Extremities- R feet partial  amputation Skin:  ntact in exposed areas . 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 Moves all 4s - yes but very weak. CAM-ICU - hard to asess . Orientation - followed commands but diffusely weak and weak voic    Resolved Hospital Problem list    HCAP ./sp Zosyn ending 2/11 -  7 day course   Assessment & Plan:  COVID-19 - 01/31/20  02/21/2020 - day 21  Plan  dc isolation Check  covid IgG  PEA Arrest presumably due to Septic Shock that was present on admission Circulatory shock   02/21/2020 - back on pressors but unclear if this is due to true hypotension v Cuff measurement issues.. On levop 40 and neo 10. Off precedexs  Plan Midodrine 20 mg 3 times daily but on hold due to npo Levophed an dneo for MAP > 65 Get aline placed for accurate BP assessment Dc precedex off MAR Check cortisol -> then start hydrocort empiric   Acute Hypoxic Respiratory Failure due to covid-19 - s/p extubation 2/11   02/21/2020 - protecting airway but deconditoning and shock might put her at risk for reintubation./ CR looks wet and raising risk for reintubation  Plan Monitor closely Not a BiPAP candidate Reintubte if needed Might need volume removal   Anoxic encephalopathy was ruled out - Precedex weaned, she is globally weak but encephalopathy improved   Plan Minimize other sedating medications Continue to correct metabolic disarray DC precedx off MAR  Pain on touch per RN  02/21/2020 - on oral gabapentin. Not a home med  Plan Dc gabapentin   ESRD on HD  baseline  02/21/2020 - no HD yesterday. Now on pressors. Bic down at 17  Plan  - ? CRRT today per renal. Likely needs volume removal   Anemia of chronic and critical illness  02/21/2020 - no bleeding  Plan - PRBC for hgb </= 6.9gm%    - exceptions are   -  if ACS susepcted/confirmed then transfuse for hgb </= 8.0gm%,  or    -  active bleeding with hemodynamic instability, then transfuse regardless of hemoglobin value   At at all times try to transfuse 1 unit prbc as possible with exception of active hemorrhage     T2DM Plan to convert insulin fusion back to [facility] 2/11 CBG q4hr   Best practice (evaluated daily)  Diet: NPO (extuabated 2/12 - no TF , no panda),. Oral meds on hold Pain/Anxiety/Delirium protocol (if indicated): none VAP protocol (if indicated): In place  DVT prophylaxis: Heparin SQ GI  prophylaxis: PPI Glucose control: SSI Mobility: Bedrest Disposition:ICU  Code Status: Full    Family:  - Updated husband by phone 2/11 - 2/12 - husband Rachel Chaney [phone] - updated. Explained of all of above    Centre   The patient Rachel Chaney is critically ill with multiple organ systems failure and requires high complexity decision making for assessment and support, frequent evaluation and titration of therapies, application of advanced monitoring technologies and extensive interpretation of multiple databases.   Critical Care Time devoted to patient care services described in this note is  45  Minutes. This time reflects time of care of this signee Dr Brand Males. This critical care time does not reflect procedure time, or teaching time or supervisory time of PA/NP/Med student/Med Resident etc but could involve care discussion time     Dr. Brand Males, M.D., [facility].C.P  Pulmonary and Critical Care Medicine Staff Physician [place] [facility] Pager: [phone], If no answer or between  15:00h - 7:00h: call [phone]  02/21/2020 7:44 AM     LABS    PULMONARY No results for input(s): PHART, PCO2ART, PO2ART, HCO3, TCO2, O2SAT in the last 168 hours.  Invalid input(s): PCO2, PO2  CBC Recent Labs  Lab 02/19/20 0453 02/20/20 0419 02/21/20 0612  HGB 10.7* 10.8* 10.1*  HCT 35.6* 33.9* 34.0*  WBC 40.8* 53.5* 55.7*  PLT 233 297 290    COAGULATION No results for input(s): INR in the last 168 hours.  CARDIAC  No results for input(s): TROPONINI in the last 168 hours. No results for input(s): PROBNP in the last 168 hours.   CHEMISTRY Recent Labs  Lab 02/16/20 0215 02/16/20 1552 02/17/20 7681 02/17/20 1628 02/18/20 0522 02/19/20 0453 02/20/20 0419 02/21/20 0612  NA 134*  --  135  --   --  134* 135 136  K 4.5  --  4.3  --   --  4.0 3.9 4.7  CL 96*  --  96*  --   --  96* 98 96*   CO2 22  --  24  --   --  20* 20* 17*  GLUCOSE 181*  --  211*  --   --  405* 186* 191*  BUN 17  --  28*  --   --  50* 75* 95*  CREATININE 2.94*  --  3.75*  --   --  4.56* 6.06* 7.24*  CALCIUM 10.0  --  10.8*  --   --  10.0 10.6* 10.4*  MG 2.3 2.4 2.6* 2.7* 2.5* 2.3 2.4 2.6*  PHOS 3.5 4.3 4.2 4.5 4.8*  --   --  8.0*   Estimated Creatinine Clearance: 9 mL/min (A) (by C-G formula based on SCr of 7.24 mg/dL (H)).   LIVER Recent Labs  Lab 02/14/20 1619 02/15/20 0428 02/15/20 1600 02/16/20 0215 02/20/20 0419  AST  --   --   --   --  33  ALT  --   --   --   --  29  ALKPHOS  --   --   --   --  129*  BILITOT  --   --   --   --  1.0  PROT  --   --   --   --  6.7  ALBUMIN 1.9* 2.0* 1.9* 2.0* 1.9*     INFECTIOUS No results for input(s): LATICACIDVEN, PROCALCITON in the last 168 hours.   ENDOCRINE CBG (last 3)  Recent Labs    02/20/20 1942 02/20/20 2307 02/21/20 0308  GLUCAP 208* 191* 186*         IMAGING x48h  - image(s) personally visualized  -   highlighted in bold DG Chest Port 1 View  Result Date: 02/21/2020 CLINICAL DATA:  60 year old female intubated.  Respiratory failure. EXAM: PORTABLE CHEST 1 VIEW COMPARISON:  Portable chest 02/18/2020 and earlier. FINDINGS: Portable AP semi upright view at 0446 hours. Extubated and enteric tube removed. Stable cardiomegaly and mediastinal contours. Stable to mildly improved lung volumes. Continued bibasilar veiling and confluent pulmonary opacity. Upper lung vascularity is stable. No pneumothorax. Paucity of bowel gas. Stable visualized osseous structures. IMPRESSION: 1. Extubated and enteric tube removed with stable to mildly improved lung volumes. 2. Continued bibasilar collapse/consolidation with probable pleural effusions. Electronically Signed   By: Herminio Heads.D.  On: 02/21/2020 07:32

## 2020-02-21 NOTE — Progress Notes (Signed)
PT Cancellation Note  Patient Details Name: Rachel Chaney MRN: [id number] DOB: [birth year]   Cancelled Treatment:    Reason Eval/Treat Not Completed: Medical issues which prohibited therapy. Pt with femoral CRRT line limiting hip flexion and potential for bed mobility. RN reports she has been assisting patient with UE mobility at this time. PT will hold at this time and continue to follow.   Zenaida Niece 02/21/2020, 3:54 PM

## 2020-02-21 NOTE — Progress Notes (Signed)
    PM rounds   - a bit strogner per RN since AM  - on CRRT  - aline revealed true BP - off levop and coming off neo  - she updted husband  - trop flats in 200s    SIGNATURE    Dr. Brand Males, M.D., F.C.C.P,  Pulmonary and Critical Care Medicine Staff Physician, [place] Director - Interstitial Lung Disease  Program  [facility] [place] [facility] [place], [place], [address]  Pager: [phone], If no answer  OR between  19:00-7:00h: page 336  [phone] Telephone (clinical office): [phone] Telephone (research): [phone]  5:22 PM 02/21/2020

## 2020-02-21 NOTE — Progress Notes (Signed)
CRITICAL VALUE ALERT  Critical Value: Troponin 277   Date & Time Notied: 2/12, 12:15  Provider Notified: Dr. Chase Caller   Orders Received/Actions taken: Md aware

## 2020-02-21 NOTE — Progress Notes (Signed)
[facility] Progress Note  Subjective: seen in ICU. CXR's looking wet per CCM, need to get volume off. Arterial BP's higher than cuff pressures.   Vitals:   02/21/20 1200 02/21/20 1300 02/21/20 1400 02/21/20 1500  BP: (!) 84/15 (!) 90/52 (!) 89/46 (!) 87/31  Pulse: (!) 105 100 98 94  Resp: (!) 27 (!) 22 (!) 23 (!) 23  Temp: 99 F (37.2 C)     TempSrc: Axillary     SpO2: 91% 90% 95% 95%  Weight:      Height:        Exam:  on vent ,sedated  no jvd  throat ett in place  Chest cta bilat and lat  Cor reg no RG  Abd soft ntnd no ascites   Ext woody bilat thigh/ hip edema 1+   Neuro on vent and sedated   LUE AVF+bruit     OP HD: [facility]   3.5h  450/800  97kg  2/2 bath  P4  L AVF  Hep 8000 - Sensipar 30 mg PO q HD - Hectoral 17mcg IV q HD - Mircera 100 q 2 weeks - ordered, not yet given, Hgb 10.8 with tsat 9 on 01/28/20  CXR 2/12 - looks more wet  Assessment/ Plan: 1. PEA Arrest - suspected d/t sepsis on admission. Pressors low-dose, getting midodrine and IV abx 2. Acute hypoxic resp failure - d/t COVID pna +/- pulm edema, on vent in ICU. Had remdesivir last admission. CXR today suggested ^edema 3. ESRD - on HD MWF.  SP CRRT here 2/4- 2/7. Had HD on 2/10. Needs volume removal now, will change back to CRRT now. UF 75- 125 cc/ hr as tolerated.  4. H/o calciphylaxis - bilat upper legs, chronic issue. 5. BP/ volume - on midodrine, low dose levo pressors. 4-5kg under dry wt.  6. 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  7. Anemia ckd - Hb 10's, hold on esa for now 8. MBD ckd - cont vdra IV     Rob Avante Carneiro 02/21/2020, 3:05 PM   Recent Labs  Lab 02/18/20 0522 02/19/20 0453 02/20/20 0419 02/21/20 0612 02/21/20 0849  K  --    < > 3.9 4.7 4.4  BUN  --    < > 75* 95*  --   CREATININE  --    < > 6.06* 7.24*  --   CALCIUM  --    < > 10.6* 10.4*  --   PHOS 4.8*  --   --  8.0*  --   HGB  --    < > 10.8* 10.1* 10.9*   < > = values in  this interval not displayed.   Inpatient medications: . aspirin  81 mg Per Tube Daily  . atorvastatin  80 mg Per Tube QHS  . B-complex with vitamin C  1 tablet Per Tube Daily  . Chlorhexidine Gluconate Cloth  6 each Topical Q0600  . docusate  100 mg Per Tube BID  . heparin  5,000 Units Subcutaneous Q8H  . hydrocortisone sod succinate (SOLU-CORTEF) inj  50 mg Intravenous Q6H  . insulin aspart  0-20 Units Subcutaneous Q4H  . midodrine  20 mg Per Tube TID WC  . polyethylene glycol  17 g Per Tube Daily  . silver sulfADIAZINE   Topical BID   .  prismasol BGK 4/2.5 400 mL/hr at 02/21/20 1109  .  prismasol BGK 4/2.5 200 mL/hr at 02/21/20 1110  . sodium chloride Stopped (  02/17/20 1636)  . sodium chloride    . famotidine (PEPCID) IV Stopped (02/20/20 2149)  . norepinephrine (LEVOPHED) Adult infusion 10 mcg/min (02/21/20 0800)  . phenylephrine (NEO-SYNEPHRINE) Adult infusion 38 mcg/min (02/21/20 1500)  . prismasol BGK 4/2.5 1,800 mL/hr at 02/21/20 1345  . propofol (DIPRIVAN) infusion 10 mcg/kg/min (02/18/20 1400)   albuterol, alteplase, bisacodyl, dextrose, fentaNYL (SUBLIMAZE) injection, fentaNYL (SUBLIMAZE) injection, heparin, heparin, sodium chloride

## 2020-02-22 DIAGNOSIS — D72829 Elevated white blood cell count, unspecified: Secondary | ICD-10-CM | POA: Diagnosis not present

## 2020-02-22 DIAGNOSIS — N186 End stage renal disease: Secondary | ICD-10-CM | POA: Diagnosis not present

## 2020-02-22 DIAGNOSIS — J810 Acute pulmonary edema: Secondary | ICD-10-CM

## 2020-02-22 DIAGNOSIS — Z515 Encounter for palliative care: Secondary | ICD-10-CM

## 2020-02-22 DIAGNOSIS — Z7189 Other specified counseling: Secondary | ICD-10-CM

## 2020-02-22 DIAGNOSIS — J9601 Acute respiratory failure with hypoxia: Secondary | ICD-10-CM | POA: Diagnosis not present

## 2020-02-22 LAB — COMPREHENSIVE METABOLIC PANEL
ALT: 27 U/L (ref 0–44)
AST: 23 U/L (ref 15–41)
Albumin: 2 g/dL — ABNORMAL LOW (ref 3.5–5.0)
Alkaline Phosphatase: 102 U/L (ref 38–126)
Anion gap: 15 (ref 5–15)
BUN: 36 mg/dL — ABNORMAL HIGH (ref 8–23)
CO2: 20 mmol/L — ABNORMAL LOW (ref 22–32)
Calcium: 10.2 mg/dL (ref 8.9–10.3)
Chloride: 101 mmol/L (ref 98–111)
Creatinine, Ser: 3.12 mg/dL — ABNORMAL HIGH (ref 0.44–1.00)
GFR, Estimated: 16 mL/min — ABNORMAL LOW (ref >60)
Glucose, Bld: 156 mg/dL — ABNORMAL HIGH (ref 70–99)
Potassium: 4.8 mmol/L (ref 3.5–5.1)
Sodium: 136 mmol/L (ref 135–145)
Total Bilirubin: 1 mg/dL (ref 0.3–1.2)
Total Protein: 6.3 g/dL — ABNORMAL LOW (ref 6.5–8.1)

## 2020-02-22 LAB — CBC
HCT: 30.3 % — ABNORMAL LOW (ref 36.0–46.0)
Hemoglobin: 9.6 g/dL — ABNORMAL LOW (ref 12.0–15.0)
MCH: 32.1 pg (ref 26.0–34.0)
MCHC: 31.7 g/dL (ref 30.0–36.0)
MCV: 101.3 fL — ABNORMAL HIGH (ref 80.0–100.0)
Platelets: 251 10*3/uL (ref 150–400)
RBC: 2.99 MIL/uL — ABNORMAL LOW (ref 3.87–5.11)
RDW: 19.4 % — ABNORMAL HIGH (ref 11.5–15.5)
WBC: 28.5 10*3/uL — ABNORMAL HIGH (ref 4.0–10.5)
nRBC: 0.2 % (ref 0.0–0.2)

## 2020-02-22 LAB — RENAL FUNCTION PANEL
Albumin: 2 g/dL — ABNORMAL LOW (ref 3.5–5.0)
Anion gap: 14 (ref 5–15)
BUN: 34 mg/dL — ABNORMAL HIGH (ref 8–23)
CO2: 20 mmol/L — ABNORMAL LOW (ref 22–32)
Calcium: 10 mg/dL (ref 8.9–10.3)
Chloride: 103 mmol/L (ref 98–111)
Creatinine, Ser: 2.85 mg/dL — ABNORMAL HIGH (ref 0.44–1.00)
GFR, Estimated: 18 mL/min — ABNORMAL LOW (ref >60)
Glucose, Bld: 157 mg/dL — ABNORMAL HIGH (ref 70–99)
Phosphorus: 5.4 mg/dL — ABNORMAL HIGH (ref 2.5–4.6)
Potassium: 4.5 mmol/L (ref 3.5–5.1)
Sodium: 137 mmol/L (ref 135–145)

## 2020-02-22 LAB — APTT: aPTT: 40 seconds — ABNORMAL HIGH (ref 24–36)

## 2020-02-22 LAB — GLUCOSE, CAPILLARY
Glucose-Capillary: 151 mg/dL — ABNORMAL HIGH (ref 70–99)
Glucose-Capillary: 132 mg/dL — ABNORMAL HIGH (ref 70–99)
Glucose-Capillary: 156 mg/dL — ABNORMAL HIGH (ref 70–99)
Glucose-Capillary: 146 mg/dL — ABNORMAL HIGH (ref 70–99)
Glucose-Capillary: 128 mg/dL — ABNORMAL HIGH (ref 70–99)

## 2020-02-22 LAB — LACTIC ACID, PLASMA: Lactic Acid, Venous: 1.5 mmol/L (ref 0.5–1.9)

## 2020-02-22 LAB — SAR COV2 SEROLOGY (COVID19)AB(IGG),IA: SARS-CoV-2 Ab, IgG: REACTIVE — AB

## 2020-02-22 LAB — MAGNESIUM: Magnesium: 2.4 mg/dL (ref 1.7–2.4)

## 2020-02-22 LAB — PHOSPHORUS: Phosphorus: 5 mg/dL — ABNORMAL HIGH (ref 2.5–4.6)

## 2020-02-22 MED ORDER — BLISTEX MEDICATED EX OINT
TOPICAL_OINTMENT | CUTANEOUS | Status: DC | PRN
  Filled 2020-02-22: qty 6.3

## 2020-02-22 MED ORDER — MIDODRINE HCL 5 MG PO TABS
10.0000 mg | ORAL_TABLET | Freq: Three times a day (TID) | ORAL | Status: DC
  Administered 2020-02-22 – 2020-02-23 (×3): 10 mg
  Filled 2020-02-22 (×2): qty 2

## 2020-02-22 MED ORDER — HYDROCORTISONE NA SUCCINATE PF 100 MG IJ SOLR: 50.0000 mg | Freq: Every day | INTRAMUSCULAR | Status: DC

## 2020-02-22 NOTE — Progress Notes (Signed)
[facility] Progress Note  Subjective: seen in ICU. 1.6 L net neg in past 24 hrs approx. BP's by a-line much better than cuff pressures. Pt more alert today.   Vitals:   02/22/20 0600 02/22/20 0700 02/22/20 0800 02/22/20 0900  BP: (!) 95/44 98/62 (!) 89/51 (!) 96/55  Pulse: 94 95 95 96  Resp: 18 18 (!) 22 (!) 21  Temp:   98 F (36.7 C)   TempSrc:   Oral   SpO2: 92% 95% 95% 96%  Weight:      Height:        Exam:  alert and responsive today, better  no jvd  throat ett in place  Chest cta bilat and lat  Cor reg no RG  Abd soft ntnd no ascites   Groin temp HD cath   Ext woody 1-2+ LE edema   Neuro better, following simple commands   LUE AVF+bruit     OP HD: [facility]   3.5h  450/800  97kg  2/2 bath  P4  L AVF  Hep 8000 - Sensipar 30 mg PO q HD - Hectoral 48mcg IV q HD - Mircera 100 q 2 weeks - ordered, not yet given, Hgb 10.8 with tsat 9 on 01/28/20  CXR 2/12 - looks more wet  Assessment/ Plan: 1. PEA Arrest - felt to be due to sepsis on admission. Recovered.  2. Sepsis/ HCAP - SP 7 day course of IV zosyn, 3d IV vanc, off pressors now.   3. Hypotension - arterial line BP's 30-50 points higher than cuff. Off pressors, BP's wnl. Will taper midodrine down 20 tid > 10 tid today.  4. Vol overload/ pulm edema - +wet by recent CXR. Is well under dry wt but still edematous in LE's. Cont UF w/ CRRT another 24 hrs or so for volume control. Repeat CXR am.  5. Acute hypoxic resp failure - d/t COVID pna +/- pulm edema on admission. Had remdesivir last admission. On vent 1 week, extubated on 2/11. Improved.  6. ESRD - on HD MWF.  Had CRRT 2/4- 2/7, then iHD, now back on CRRT 2/12- current.  7. H/o calciphylaxis - bilat upper legs, chronic issue. 8. 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  9. Anemia ckd - Hb 10's, hold on esa for now 10. MBD ckd - cont vdra IV     Rob Kyle Luppino 02/22/2020, 11:01 AM   Recent Labs  Lab 02/18/20 0522  02/19/20 0453 02/20/20 0419 02/21/20 0612 02/21/20 0849 02/22/20 1030  K  --    < > 3.9 4.7 4.4  --   BUN  --    < > 75* 95*  --   --   CREATININE  --    < > 6.06* 7.24*  --   --   CALCIUM  --    < > 10.6* 10.4*  --   --   PHOS 4.8*  --   --  8.0*  --   --   HGB  --    < > 10.8* 10.1* 10.9* 9.6*   < > = values in this interval not displayed.   Inpatient medications: . aspirin  81 mg Per Tube Daily  . atorvastatin  80 mg Per Tube QHS  . B-complex with vitamin C  1 tablet Per Tube Daily  . Chlorhexidine Gluconate Cloth  6 each Topical Q0600  . docusate  100 mg Per Tube BID  . heparin  5,000 Units Subcutaneous  Q8H  . hydrocortisone sod succinate (SOLU-CORTEF) inj  50 mg Intravenous Q6H  . insulin aspart  0-20 Units Subcutaneous Q4H  . midodrine  20 mg Per Tube TID WC  . polyethylene glycol  17 g Per Tube Daily  . silver sulfADIAZINE   Topical BID   .  prismasol BGK 4/2.5 400 mL/hr at 02/22/20 1000  .  prismasol BGK 4/2.5 200 mL/hr at 02/22/20 0900  . sodium chloride Stopped (02/17/20 1636)  . sodium chloride 10 mL/hr at 02/22/20 0700  . famotidine (PEPCID) IV 20 mg (02/21/20 2119)  . norepinephrine (LEVOPHED) Adult infusion 10 mcg/min (02/21/20 0800)  . phenylephrine (NEO-SYNEPHRINE) Adult infusion 28 mcg/min (02/21/20 1700)  . prismasol BGK 4/2.5 1,800 mL/hr at 02/22/20 0649  . propofol (DIPRIVAN) infusion 10 mcg/kg/min (02/18/20 1400)   albuterol, alteplase, bisacodyl, dextrose, fentaNYL (SUBLIMAZE) injection, fentaNYL (SUBLIMAZE) injection, heparin, heparin, sodium chloride

## 2020-02-22 NOTE — Evaluation (Signed)
Physical Therapy Evaluation Patient Details Name: Rachel Chaney MRN: [id number] DOB: 01-28-1960 Today's Date: 02/22/2020   History of Present Illness  60 yo admitted 2/2 with leg pain and suffered PEA arrest during HD 2/4. PMhx: Covid + 01/31/20, chronic respiratory failure, ESRD MWF, DM, HFpEF, HTN, HLD, CAD s/p MI  Clinical Impression  Pt awake, alert, slow to respond and confused regarding time, situation and pt thinking spouse present in room when he is not. Pt able to move bil UE and LLE with assist with extremely weak grip on right and delayed response. Pt with Rt fem CRRT line that is currently very positional impairing ability to move RLE at all and cannot raise HOB beyond 30 degrees due to positioning. Anticipate CRRT will come off soon improving ability to at least move in bed until Rt fem line out. Pt with decreased cognition, mobility, strength and function who will benefit from acute therapy to maximize function and strength to decrease burden of care.   HR 99 SpO2 96% on 12L HFNC Art line BP 104/46 (61)    Follow Up Recommendations CIR;Supervision/Assistance - 24 hour;SNF (pending progression and medical stability)    Equipment Recommendations  Other (comment) (TBD)    Recommendations for Other Services       Precautions / Restrictions Precautions Precautions: Fall;Other (comment) Precaution Comments: CRRT with RT fem line very positional and cannot elevate HOB >30      Mobility  Bed Mobility               General bed mobility comments: unable due to precautions    Transfers                    Ambulation/Gait                Stairs            Wheelchair Mobility    Modified Rankin (Stroke Patients Only)       Balance                                             Pertinent Vitals/Pain Pain Assessment: No/denies pain Faces Pain Scale: No hurt    Home Living Family/patient expects to be discharged to::  Private residence Living Arrangements: Spouse/significant other Available Help at Discharge: Family;Available 24 hours/day Type of Home: House Home Access: Stairs to enter Entrance Stairs-Rails: Right Entrance Stairs-Number of Steps: 4 Home Layout: One level Home Equipment: Walker - 2 wheels;Cane - single point;Grab bars - tub/shower;Wheelchair - Insurance claims handler - 4 wheels;Hand held shower head      Prior Function           Comments: pt unable to provide PLOF but did state home setup consistent with last admission. Last admission rollator for gait and completing aDLs     Hand Dominance        Extremity/Trunk Assessment   Upper Extremity Assessment Upper Extremity Assessment: RUE deficits/detail;LUE deficits/detail RUE Deficits / Details: very limited grip unable to complete fist. grossly 2/5 strength LUE Deficits / Details: 3/5 grip strength with grossly 2/5 functional strength    Lower Extremity Assessment Lower Extremity Assessment: RLE deficits/detail;LLE deficits/detail RLE Deficits / Details: unable to assess due to fem CRRT line LLE Deficits / Details: grossly 2/5    Cervical / Trunk Assessment Cervical / Trunk Assessment:  (unable to assess due  to restricted to 30 degree HOB)  Communication   Communication: No difficulties  Cognition Arousal/Alertness: Awake/alert Behavior During Therapy: Flat affect Overall Cognitive Status: Impaired/Different from baseline Area of Impairment: Orientation;Attention;Memory;Safety/judgement;Problem solving                 Orientation Level: Disoriented to;Time;Situation Current Attention Level: Focused Memory: Decreased short-term memory Following Commands: Follows one step commands inconsistently;Follows one step commands with increased time Safety/Judgement: Decreased awareness of safety;Decreased awareness of deficits   Problem Solving: Slow processing;Requires verbal cues;Requires tactile cues General  Comments: pt thought spouse was in the room, providing birthdate when asked day, oriented to month and place. Slow to respond with repeated cues, requires multimodal cues to move extremities      General Comments      Exercises General Exercises - Upper Extremity Shoulder Flexion: AAROM;Both;Supine;10 reps Shoulder ABduction: AAROM;Both;Supine;10 reps Elbow Flexion: AAROM;Both;Supine;10 reps Elbow Extension: AAROM;Both;Supine;10 reps General Exercises - Lower Extremity Short Arc Quad: AROM;Left;Supine;10 reps Heel Slides: AAROM;Left;Supine;10 reps Hip ABduction/ADduction: AAROM;Left;Supine;10 reps Straight Leg Raises: AAROM;Left;Supine;10 reps   Assessment/Plan    PT Assessment Patient needs continued PT services  PT Problem List Decreased activity tolerance;Decreased balance;Decreased mobility;Decreased strength;Decreased coordination;Cardiopulmonary status limiting activity;Decreased cognition;Decreased knowledge of use of DME;Decreased range of motion;Decreased safety awareness       PT Treatment Interventions DME instruction;Therapeutic exercise;Gait training;Balance training;Functional mobility training;Therapeutic activities;Patient/family education;Cognitive remediation;Neuromuscular re-education    PT Goals (Current goals can be found in the Care Plan section)  Acute Rehab PT Goals Patient Stated Goal: see [facility] PT Goal Formulation: With patient Time For Goal Achievement: 03/07/20 Potential to Achieve Goals: Fair    Frequency Min 3X/week   Barriers to discharge Decreased caregiver support      Co-evaluation               AM-PAC PT "6 Clicks" Mobility  Outcome Measure Help needed turning from your back to your side while in a flat bed without using bedrails?: A Lot Help needed moving from lying on your back to sitting on the side of a flat bed without using bedrails?: Total Help needed moving to and from a bed to a chair (including a wheelchair)?:  Total Help needed standing up from a chair using your arms (e.g., wheelchair or bedside chair)?: Total Help needed to walk in hospital room?: Total Help needed climbing 3-5 steps with a railing? : Total 6 Click Score: 7    End of Session   Activity Tolerance: Patient tolerated treatment well Patient left: in bed;with call bell/phone within reach;with bed alarm set Nurse Communication: Mobility status PT Visit Diagnosis: Other abnormalities of gait and mobility (R26.89);Difficulty in walking, not elsewhere classified (R26.2)    Time: 2831-5176 PT Time Calculation (min) (ACUTE ONLY): 17 min   Charges:   PT Evaluation $PT Eval High Complexity: 1 High          Laurance Heide P, PT Acute Rehabilitation Services Pager: [phone] Office: [phone]   Sandy Salaam Lucillia Corson 02/22/2020, 12:01 PM

## 2020-02-22 NOTE — Evaluation (Signed)
Clinical/Bedside Swallow Evaluation Patient Details  Name: Rachel Chaney MRN: [id number] Date of Birth: Apr 30, 1960  Today's Date: 02/22/2020 Time: SLP Start Time (ACUTE ONLY): 0900 SLP Stop Time (ACUTE ONLY): 0920 SLP Time Calculation (min) (ACUTE ONLY): 20 min  Past Medical History:  Past Medical History:  Diagnosis Date  . Abnormal appearance of cervix 08/08/2011  . Acute respiratory failure with hypoxia ([place])   . Blurred vision 01/28/2020  . Cardiac arrest ([place]) 02/28/2018   while on hemodialysis in hospital  . CHF (congestive heart failure) ([place])   . Chronic kidney disease due to diabetes mellitus ([place]) 10/13/2013  . Community acquired bilateral lower lobe pneumonia 11/11/2019  . COVID-19 01/31/2020  . Diabetes mellitus    Type II  . ESRD (end stage renal disease) ([place])    MWF - [facility]  . Excess fluid volume 12/16/2019  . Hyperlipidemia   . Hypertension   . Neuropathy    Past Surgical History:  Past Surgical History:  Procedure Laterality Date  . AMPUTATION Right 02/27/2018   Procedure: RIGHT FOOT 2ND AND 3RD RAY AMPUTATION;  Surgeon: [name], MD;  Location: [place];  Service: Orthopedics;  Laterality: Right;  . AMPUTATION Right 04/24/2018   Procedure: RIGHT TRANSMETATARSAL AMPUTATION, PLACE VAC;  Surgeon: [name], MD;  Location: [place];  Service: Orthopedics;  Laterality: Right;  . AV FISTULA PLACEMENT Left 04/15/2015   Procedure: ARTERIOVENOUS (AV) FISTULA CREATION;  Surgeon: Mal Misty, MD;  Location: [place];  Service: Vascular;  Laterality: Left;  . EYE SURGERY Right    retinal detachment  . FISTULA SUPERFICIALIZATION Left 06/03/2015   Procedure: LEFT UPPER ARM BRACHIOCEPHALIC FISTULA SUPERFICIALIZATION;  Surgeon: Mal Misty, MD;  Location: [place];  Service: Vascular;  Laterality: Left;  from [place] to [facility]  . STUMP REVISION Right 05/24/2018   Procedure: REVISION RIGHT TRANSMETATARSAL AMPUTATION WITH EXCISION BONE/SOFT TISSUE, LOCAL TISSUE   RE-ARRANGEMENT  AND VAC PLACEMENT;  Surgeon: [name], MD;  Location: [place];  Service: Orthopedics;  Laterality: Right;   HPI:  Patient is a 60 y.o. female with PMH: ESRD on HD M/W/F, chronic hypoxic respiratory failure, DM-2, HFpEFHTN, h/o MI, HLD. She was brought to ED on 2/2 for increased pain on her skin lesions. Initially, she was stable on RA, but later noted to be hypoxic at 76% SpO2. Patient reportedly uses 3L O2 at home but did not bring with her to ED. She tested positive for Covid-19 on 01/31/2020. CXR revealed patchy bilateral infiltrates, c/w multifocal PNA likely due to recent Covid infection. on 2/4 patietn was yelling out, then became unresponsive and CODE BLUE was called. She was transferred to ICU and intubated. She was then extubated on 2/11.   Assessment / Plan / Recommendation Clinical Impression  Patient presents with a  moderate oropharyngeal dysphagia with likely impact from COVID-19 infection last month and prolonged intubation. Her voice is clear but low in vocal intensity and patient appears with some confusion as well and she was unable to adequately respond to open ended questions. Patient accepted a very small bite of applesauce and exhibited multiple swallows but no other overt s/s. She exhbited suspected swallow delay, oral transit delay and multiple swallows with small, single ice chips. No coughing, throat clearing, chagne in vitals or change in voice was observed. As patient is not yet medically able to be elevated in bed beyond 25 degrees, she is not ready for PO intake. She may have small amount of  ice chips PRN after oral care and with nursing and/or SLP. SLP Visit Diagnosis: Dysphagia, unspecified (R13.10)    Aspiration Risk  Mild aspiration risk    Diet Recommendation Ice chips PRN after oral care   Medication Administration: Via alternative means Supervision: Full supervision/cueing for compensatory strategies;Staff to assist with self feeding    Other   Recommendations Oral Care Recommendations: Oral care BID   Follow up Recommendations Other (comment) (TBD)      Frequency and Duration min 2x/week  1 week       Prognosis   Good     Swallow Study   General Date of Onset: 03/03/2020 HPI: Patient is a 60 y.o. female with PMH: ESRD on HD M/W/F, chronic hypoxic respiratory failure, DM-2, HFpEFHTN, h/o MI, HLD. She was brought to ED on 2/2 for increased pain on her skin lesions. Initially, she was stable on RA, but later noted to be hypoxic at 76% SpO2. Patient reportedly uses 3L O2 at home but did not bring with her to ED. She tested positive for Covid-19 on 01/31/2020. CXR revealed patchy bilateral infiltrates, c/w multifocal PNA likely due to recent Covid infection. on 2/4 patietn was yelling out, then became unresponsive and CODE BLUE was called. She was transferred to ICU and intubated. She was then extubated on 2/11. Type of Study: Bedside Swallow Evaluation Previous Swallow Assessment: None found Diet Prior to this Study: NPO Temperature Spikes Noted: No Respiratory Status: Nasal cannula History of Recent Intubation: Yes Length of Intubations (days): 8 days Date extubated: 02/20/20 Behavior/Cognition: Alert;Cooperative;Confused;Pleasant mood Oral Cavity Assessment: Within Functional Limits Oral Care Completed by SLP: Yes Oral Cavity - Dentition: Edentulous Self-Feeding Abilities: Total assist Patient Positioning: Partially reclined;Postural control interferes with function Baseline Vocal Quality: Low vocal intensity Volitional Cough: Weak Volitional Swallow: Able to elicit    Oral/Motor/Sensory Function Overall Oral Motor/Sensory Function: Within functional limits   Ice Chips Ice chips: Impaired Pharyngeal Phase Impairments: Multiple swallows;Suspected delayed Swallow   Thin Liquid Thin Liquid: Not tested    Nectar Thick     Honey Thick     Puree Puree: Within functional limits   Solid     Solid: Not tested     Sonia Baller, MA, CCC-SLP Speech Therapy

## 2020-02-22 NOTE — Progress Notes (Addendum)
Rachel Chaney, MRN:  [id number], DOB:  09-13-1960, LOS: 57 ADMISSION DATE:  02/12/2020, CONSULTATION DATE:  03/07/2020 REFERRING MD:  Dolores Hoose, CHIEF COMPLAINT:  PEA arrest    Brief History:  Michel Eskelson is a 60 y.o. female with a pPMH of ESRD on HD (MWF), Chronic hypoxic respiratory failure, T2DM, HFpEF, HTN, HLD, CAD s/p MI (2020) who presented to [facility] with chronic nonspecific skin pain over her bilateral lower extremities and her hospitalization has been complicated by acute on chronic hypoxic respiratory failure and hypotension.   Patient had PEA cardiac arrest during dialysis on 2/4, she was transferred to ICU. She was covid positive prior to this admit on 01/31/20  Past Medical History:  ESRD  Type II diabetes  HLD HTN Cardiac arrest  CHF  Significant Hospital Events:  02/02 - Admitted for leg pain found hypoxic and hypotensive.  02/04 - PEA arrest xxxxxxx 2./11 - Precedex weaned off this morning Remains on insulin drip but likely can be transitioned off Norepinephrine 3. 0.50, currently on pressure support ventilation. Scheduled for hemodialysis later today  2/12 - 2/12 - remains extubated  since yesterday.  Overnight  Needing pressors.  Ongoping challenges with measuring BP -> being measured RLE. On [place] -o2 -followgn some commands. Deconditioned and weak. Difffuse pain per RN  Consults:  Nephrology  Cardiology  Procedures:   ? Date ETT > 2/11  Significant Diagnostic Tests:  01/23 Echo > 60 to 65%  02/02 CXR > Patchy BILATERAL pulmonary infiltrates favor multifocal pneumonia over pulmonary edema.  02/02 CT Abdomen/Pelvis > No acute abnormality. Multiple chronic findings as detailed above, not substantially changed from recent prior study.  02/02 CT left and right femur > No acute osseous abnormality. There is nonspecific lower extremity edema without evidence for an abscess. Findings are nonspecific but can be seen in patients with cellulitis in the appropriate  clinical setting. Other differential considerations include chronic venous insufficiency given the presence of subcutaneous calcifications.  Micro Data:  01/31/20 - covid positive xxxx MRSA PCR 2/4 > neg Blood culture 2/4 > neg Respiratory 2/4 > neg  Antimicrobials:  Zosyn 2/4 > Vancomycin 2/2 > 2/7  Interim History / Subjective:   2/13 -seen by speech eval.  Medically not ready for p.o. intake.  But can have small amount of ice chips as needed.  1.6 L net negative.  Blood pressure and arterial line much better.  Off pressors.  More alert today.  She is on 10 L oxygen.  Currently on CRRT with volume removal.  Objective   Blood pressure (!) 82/43, pulse 98, temperature 98 F (36.7 C), temperature source Oral, resp. rate 18, height 5\' 4"  (1.626 m), weight 90.4 kg, last menstrual period 09/18/2011, SpO2 100 %.        Intake/Output Summary (Last 24 hours) at 02/22/2020 1206 Last data filed at 02/22/2020 1000 Gross per 24 hour  Intake 744.85 ml  Output 2890 ml  Net -2145.15 ml   Filed Weights   02/19/20 0424 02/21/20 0500 02/22/20 0500  Weight: 90.4 kg 92.4 kg 90.4 kg      General Appearance:  Looks  OBESE - and deconditioned but more alert. Head:  Normocephalic, without obvious abnormality, atraumatic Eyes:  PERRL - yes, conjunctiva/corneas - muddy     Ears:  Normal external ear canals, both ears Nose:  G tube - no but has [place] Throat:  ETT TUBE - no , OG tube - no Neck:  Supple,  No enlargement/tenderness/nodules Lungs:  Clear to auscultation bilaterally, Distant breath sound Heart:  S1 and S2 normal, no murmur, CVP - x.  Pressors - yes Abdomen:  Soft, no masses, no organomegaly Genitalia / Rectal:  Not done Extremities:  Extremities- R feet partial amputation Skin:  ntact in exposed areas . 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 Moves all 4s - yes but very weak though improved. CAM-ICU - hard to asess . Orientation - followed commands  but diffusely weak and weak voic    Resolved Hospital Problem list    HCAP ./sp Zosyn ending 2/11 -  7 day course Acute metabolic encephalopathy 1/76/1607 and off Precedex  Assessment & Plan:  COVID-19 - 01/31/20 covid igG  - reactive 02/21/20  02/22/2020 - day 22  Plan off isolation   PEA Arrest presumably due to Septic Shock that was present on admission Circulatory shock -significant discrepancy between blood pressure cuff in the right lower extremity and arterial line   02/22/2020 -off pressors after arterial line placed 02/21/2020.  On empiric hydrocortisone but random cortisol 31.6.  Plan Midodrine 20 mg 3 times daily but on hold due to npo > RN plans to crush and give it DC Levophed and Neo-Synephrine from the [facility] Reduce hydrocortisone to 50 mg once daily and aim to discontinue 02/23/2020 Mean arterial pressure goal greater than 55 or systolic blood pressure goal greater than 95   Acute Hypoxic Respiratory Failure due to covid-19 - s/p extubation 2/11   02/22/2020 - -improved mental status and looking stronger especially with volume removal.  Wet looking chest x-ray 02/21/2020. On [place] o2  Plan Monitor closely Reintubte if needed Chest x-ray 02/23/2020 for monitoring O2 for pulse ox goal >= 92%   ESRD on HD  baseline  02/22/2020 - no HD yesterday.  On CRRT since 02/21/2020 with effective volume removal  Plan  -Renal suggest CCM can stop CRRT 02/23/2020 based on clinical grounds   Anemia of chronic and critical illness  02/22/2020 - no bleeding  Plan - PRBC for hgb </= 6.9gm%    - exceptions are   -  if ACS susepcted/confirmed then transfuse for hgb </= 8.0gm%,  or    -  active bleeding with hemodynamic instability, then transfuse regardless of hemoglobin value   At at all times try to transfuse 1 unit prbc as possible with exception of active hemorrhage     T2DM Plan to convert insulin fusion back to [facility] 2/11 CBG q4hr   Best practice (evaluated daily)   Diet: NPO (extuabated 2/12 - no TF , no panda),. Oral meds on hold Pain/Anxiety/Delirium protocol (if indicated): none VAP protocol (if indicated): In place  DVT prophylaxis: Heparin SQ GI prophylaxis: h2 blockade Glucose control: SSI Mobility: Bedrest Disposition:ICU  Code Status: Full    Family:  - Updated husband by phone 2/11 - 2/12 - husband Kailia Starry [phone] - updated. Explained of all of above - 2/13 -Mr. Stueve  Husband - updated over telephone.  He will drop by2/14/22 to bedside. Wants RN to call him    ATTESTATION & SIGNATURE   The patient Ahriyah I Kever is critically ill with multiple organ systems failure and requires high complexity decision making for assessment and support, frequent evaluation and titration of therapies, application of advanced monitoring technologies and extensive interpretation of multiple databases.   Critical Care Time devoted to patient care services described in this note is  40  Minutes. This time reflects time of care of this signee Dr Brand Males. This critical care time does not reflect procedure time, or teaching time or supervisory time of PA/NP/Med student/Med Resident etc but could involve care discussion time     Dr. Brand Males, M.D., [facility].C.P Pulmonary and Critical Care Medicine Staff Physician [place] [facility] Pager: [phone], If no answer or between  15:00h - 7:00h: call [phone]  02/22/2020 12:20 PM      LABS    PULMONARY Recent Labs  Lab 02/21/20 0849  PHART 7.284*  PCO2ART 41.2  PO2ART 66*  HCO3 19.4*  TCO2 21*  O2SAT 89.0    CBC Recent Labs  Lab 02/20/20 0419 02/21/20 0612 02/21/20 0849 02/22/20 1030  HGB 10.8* 10.1* 10.9* 9.6*  HCT 33.9* 34.0* 32.0* 30.3*  WBC 53.5* 55.7*  --  28.5*  PLT 297 290  --  251    COAGULATION No results for input(s): INR in the last 168 hours.  CARDIAC  No results for input(s): TROPONINI in  the last 168 hours. No results for input(s): PROBNP in the last 168 hours.   CHEMISTRY Recent Labs  Lab 02/17/20 1540 02/17/20 1628 02/18/20 0522 02/19/20 0453 02/20/20 0419 02/21/20 0612 02/21/20 0849 02/22/20 1029  NA 135  --   --  134* 135 136 133* 136  K 4.3  --   --  4.0 3.9 4.7 4.4 4.8  CL 96*  --   --  96* 98 96*  --  101  CO2 24  --   --  20* 20* 17*  --  20*  GLUCOSE 211*  --   --  405* 186* 191*  --  156*  BUN 28*  --   --  50* 75* 95*  --  36*  CREATININE 3.75*  --   --  4.56* 6.06* 7.24*  --  3.12*  CALCIUM 10.8*  --   --  10.0 10.6* 10.4*  --  10.2  MG 2.6* 2.7* 2.5* 2.3 2.4 2.6*  --  2.4  PHOS 4.2 4.5 4.8*  --   --  8.0*  --  5.0*   Estimated Creatinine Clearance: 20.6 mL/min (A) (by C-G formula based on SCr of 3.12 mg/dL (H)).   LIVER Recent Labs  Lab 02/15/20 1600 02/16/20 0215 02/20/20 0419 02/22/20 1029  AST  --   --  33 23  ALT  --   --  29 27  ALKPHOS  --   --  129* 102  BILITOT  --   --  1.0 1.0  PROT  --   --  6.7 6.3*  ALBUMIN 1.9* 2.0* 1.9* 2.0*     INFECTIOUS Recent Labs  Lab 02/21/20 1105 02/22/20 1030  LATICACIDVEN 1.7 1.5     ENDOCRINE CBG (last 3)  Recent Labs    02/21/20 2342 02/22/20 0335 02/22/20 0843  GLUCAP 143* 151* 132*         IMAGING x48h  - image(s) personally visualized  -   highlighted in bold DG Chest Port 1 View  Result Date: 02/21/2020 CLINICAL DATA:  59 year old female intubated.  Respiratory failure. EXAM: PORTABLE CHEST 1 VIEW COMPARISON:  Portable chest 02/18/2020 and earlier. FINDINGS: Portable AP semi upright view at 0446 hours. Extubated and enteric tube removed. Stable cardiomegaly and mediastinal contours. Stable to mildly improved lung volumes. Continued bibasilar veiling and confluent pulmonary opacity. Upper lung vascularity is stable. No pneumothorax. Paucity  of bowel gas. Stable visualized osseous structures. IMPRESSION: 1. Extubated and enteric tube removed with stable to mildly improved  lung volumes. 2. Continued bibasilar collapse/consolidation with probable pleural effusions. Electronically Signed   By: Genevie Ann M.D.   On: 02/21/2020 07:32   [place] EKG SITE RITE  Result Date: 02/21/2020 If [facility] image not attached, placement could not be confirmed due to current cardiac rhythm.

## 2020-02-22 NOTE — Consult Note (Signed)
Consultation Note Date: 02/22/2020   Patient Name: Rachel Chaney  DOB: 02-03-1960  MRN: [id number]  Age / Sex: 60 y.o., female   PCP: Benay Pike, MD Referring Physician: Brand Males, MD  Reason for Consultation: Establishing goals of care  HPI/Patient Profile: 60 y.o. female  with past medical history of ESRD on hemodialysis, chronic hypoxic respiratory failure, Type 2 DM, HFpEF, HTN, HLD, CAD s/p MI (2020) admitted on 03/03/2020 with chronic nonspecific pain bilateral lower extremities. Hospitalization complicated by acute on chronic respiratory failure, hypotension, and PEA arrest during dialysis on 2/4. Patient extubated 2/11 and stable, but weak and high risk for re-intubation. CRRT 2/4-2/7 and restarted 2/12 due to hypotension and fluid overload. Off pressors 2/13. Currently on 10L and more awake/alert. Palliative medicine consultation for support and goals of care.   Clinical Assessment and Goals of Care:  I have reviewed medical records, discussed with RN, and visited patient at bedside. Rachel Chaney is awake. She is oriented to person/place. She appears comfortable without distress. She is currently on CRRT. She is hopeful her husband will visit today.   No family at bedside. Spoke with husband, Rachel Chaney via telephone.  I introduced Palliative Medicine as specialized medical care for people living with serious illness. It focuses on providing relief from the symptoms and stress of a serious illness. The goal is to improve quality of life for both the patient and the family.  [place] has been on dialysis approximately 3 years. Rachel Chaney shares that the last few weeks have been difficult with decreased oral intake and drowsiness. Outpatient dialysis has become more challenging as she feels they remove too much fluid during sessions. No children.    Discussed course of hospitalization including diagnoses,  interventions, plan of care.   Advanced directives and concepts specific to code status were discussed. Rachel Chaney reports she does NOT have a documented living will and they really have not discussed this. At this point, he wishes for ongoing full code/full scope treatment including re-intubation if it came to that. He shares that her heart has stopped twice during dialysis and survived.  Rachel Chaney is hopeful for ongoing improvement in her condition. "Give her time." He is hopeful she will pass swallow study and start eating in order to get her strength back. We discussed pending PT evaluation when appropriate and explained that she likely will need rehab facility.     Answered questions. Reassured of ongoing intermittent support. Also explained that Rachel Chaney can visit her now that she is off isolation. He is thrilled and plans to visit [facility] today.   SUMMARY OF RECOMMENDATIONS    Continue full code/full scope treatment per husband's wishes.  Watchful waiting. Time for outcomes.  Ongoing palliative discussions pending clinical course.  PT/OT/SLP efforts when appropriate. Likely will need SNF rehab.   Code Status/Advance Care Planning:  Full code  Symptom Management:   Per attending  Palliative Prophylaxis:   Aspiration, Delirium Protocol, Frequent Pain Assessment, Oral Care and Turn Reposition  Additional Recommendations (Limitations, Scope, Preferences):  Full  Scope Treatment  Psycho-social/Spiritual:   Desire for further Chaplaincy support: yes  Additional Recommendations: Caregiving  Support/Resources and Compassionate Wean Education  Prognosis:   Unable to determine  Discharge Planning: To Be Determined      Primary Diagnoses: Present on Admission: . Acute respiratory failure with hypoxia ([place])   I have reviewed the medical record, interviewed the patient and family, and examined the patient. The following aspects are pertinent.  Past Medical History:  Diagnosis  Date  . Abnormal appearance of cervix 08/08/2011  . Acute respiratory failure with hypoxia ([place])   . Blurred vision 01/28/2020  . Cardiac arrest ([place]) 02/28/2018   while on hemodialysis in hospital  . CHF (congestive heart failure) ([place])   . Chronic kidney disease due to diabetes mellitus ([place]) 10/13/2013  . Community acquired bilateral lower lobe pneumonia 11/11/2019  . COVID-19 01/31/2020  . Diabetes mellitus    Type II  . ESRD (end stage renal disease) ([place])    MWF - [facility]  . Excess fluid volume 12/16/2019  . Hyperlipidemia   . Hypertension   . Neuropathy    Social History   Socioeconomic History  . Marital status: Married    Spouse name: Not on file  . Number of children: Not on file  . Years of education: Not on file  . Highest education level: Not on file  Occupational History  . Not on file  Tobacco Use  . Smoking status: Never Smoker  . Smokeless tobacco: Never Used  Vaping Use  . Vaping Use: Never used  Substance and Sexual Activity  . Alcohol use: No    Alcohol/week: 0.0 standard drinks  . Drug use: No  . Sexual activity: Never  Other Topics Concern  . Not on file  Social History Narrative  . Not on file   Social Determinants of Health   Financial Resource Strain: Not on file  Food Insecurity: Not on file  Transportation Needs: Not on file  Physical Activity: Not on file  Stress: Not on file  Social Connections: Not on file   Family History  Problem Relation Age of Onset  . Heart disease Mother   . Heart disease Father    Scheduled Meds: . aspirin  81 mg Per Tube Daily  . atorvastatin  80 mg Per Tube QHS  . B-complex with vitamin C  1 tablet Per Tube Daily  . Chlorhexidine Gluconate Cloth  6 each Topical Q0600  . docusate  100 mg Per Tube BID  . heparin  5,000 Units Subcutaneous Q8H  . [START ON 02/23/2020] hydrocortisone sod succinate (SOLU-CORTEF) inj  50 mg Intravenous Daily  . insulin aspart  0-20 Units Subcutaneous Q4H  .  midodrine  10 mg Per Tube TID WC  . polyethylene glycol  17 g Per Tube Daily  . silver sulfADIAZINE   Topical BID   Continuous Infusions: .  prismasol BGK 4/2.5 400 mL/hr at 02/22/20 1400  .  prismasol BGK 4/2.5 200 mL/hr at 02/22/20 1400  . sodium chloride Stopped (02/17/20 1636)  . sodium chloride 10 mL/hr at 02/22/20 0700  . famotidine (PEPCID) IV 20 mg (02/21/20 2119)  . prismasol BGK 4/2.5 1,800 mL/hr at 02/22/20 1300  . propofol (DIPRIVAN) infusion 10 mcg/kg/min (02/18/20 1400)   PRN Meds:.albuterol, alteplase, bisacodyl, dextrose, fentaNYL (SUBLIMAZE) injection, fentaNYL (SUBLIMAZE) injection, heparin, heparin, lip balm, sodium chloride Medications Prior to Admission:  Prior to Admission medications   Medication Sig Start Date End Date Taking? Authorizing Provider  acetaminophen (TYLENOL) 500 MG tablet Take 1,000 mg by mouth every 6 (six) hours as needed for mild pain or headache.    Yes [provider]  aspirin EC 81 MG tablet Take 1 tablet (81 mg total) by mouth daily. 04/30/18  Yes Kilroy, Luke K, PA-C  atorvastatin (LIPITOR) 80 MG tablet TAKE 1 TABLET(80 MG) BY MOUTH AT BEDTIME Patient taking differently: Take 80 mg by mouth at bedtime. 11/04/19  Yes Benay Pike, MD  Blood Glucose Monitoring Suppl (ONE TOUCH ULTRA 2) w/Device KIT 1 kit by Does not apply route 3 (three) times daily. ICD-10 code: E11.40 03/26/18  Yes Oscar La, Arlo C, PA-C  cetirizine (ZYRTEC) 10 MG tablet Take 10 mg by mouth as needed for allergies.  04/26/18  Yes [provider]  cyclobenzaprine (FLEXERIL) 10 MG tablet TAKE 1 TABLET(10 MG) BY MOUTH THREE TIMES DAILY BETWEEN MEALS AND AT BEDTIME AS NEEDED FOR MUSCLE SPASMS Patient taking differently: Take 10 mg by mouth 3 (three) times daily as needed for muscle spasms. 12/09/19  Yes Benay Pike, MD  Doxepin HCl 5 % CREA Apply to affected area up to 4 times a day as needed for up to eight days. 01/08/20  Yes Benay Pike, MD  fluticasone  [facility]) 50 MCG/ACT nasal spray Place 1 spray into both nostrils daily. Patient taking differently: Place 1 spray into both nostrils daily as needed for allergies. 06/16/19  Yes Benay Pike, MD  glucose blood (ONE TOUCH ULTRA TEST) test strip 1 each by Other route 3 (three) times daily. ICD-10 code: E11.40. 03/26/18  Yes Lassen, Arlo C, PA-C  HYDROcodone-acetaminophen (NORCO/VICODIN) 5-325 MG tablet Take 1-2 tablets by mouth every 6 (six) hours as needed. Patient taking differently: Take 1-2 tablets by mouth every 6 (six) hours as needed for moderate pain. 02/05/20  Yes Davonna Belling, MD  hydrOXYzine (ATARAX/VISTARIL) 10 MG tablet Take 1 tablet (10 mg total) by mouth daily as needed for itching. 01/15/20  Yes Beard, Samantha N, DO  Insulin Pen Needle (B-D UF III MINI PEN NEEDLES) 31G X 5 MM MISC USE AS DIRECTED TWICE DAILY 04/30/19  Yes Benay Pike, MD  Insulin Pen Needle 29G X 12MM MISC Inject 1 pen into the skin 2 (two) times daily. Check blood sugar daily. 03/26/18  Yes Lassen, Arlo C, PA-C  Insulin Syringe-Needle U-100 Flossie Buffy INSULIN SYRINGE) 30G X 5/16" 1 ML MISC USE AS DIRECTED 03/26/18  Yes Oscar La, Arlo C, PA-C  Iron Sucrose (VENOFER IV) Inject 1 Dose as directed See admin instructions. With dialysis 02/04/20 02/13/20 Yes [provider]  OneTouch Delica Lancets 74Y MISC 1 Device by Does not apply route as needed (to check blood glucose). 03/26/18  Yes Lassen, Arlo C, PA-C  traMADol (ULTRAM) 50 MG tablet Take 1 tablet (50 mg total) by mouth every 12 (twelve) hours as needed for severe pain. 01/28/20 02/27/20 Yes Meccariello, Bernita Raisin, DO  ferric citrate (AURYXIA) 1 GM 210 MG(Fe) tablet Take 2 tablets (420 mg total) by mouth 3 (three) times daily with meals. Give two (420 mg) by mouth three times a day wih meals for ESRD Patient not taking: Reported on 02/20/2020 03/26/18   Wille Celeste, PA-C  nystatin ointment (MYCOSTATIN) Apply 1 application topically 2 (two) times daily. Patient not  taking: Reported on 01/31/2020    [provider]  triamcinolone ointment (KENALOG) 0.5 % Apply 1 application topically 2 (two) times daily. Patient not taking: Reported on 01/31/2020    [provider]  progesterone (PROMETRIUM) 200 MG capsule Take 1 capsule (200 mg total) by mouth daily. Take for 10 days if bleeding occurs for more than 4 days. 11/09/10 03/28/11  Lyndal Pulley, DO   No Known Allergies Review of Systems  Unable to perform ROS: Acuity of condition   Physical Exam Vitals and nursing note reviewed.  Constitutional:      Appearance: She is ill-appearing.  HENT:     Head: Normocephalic and atraumatic.  Cardiovascular:     Rate and Rhythm: Normal rate.  Pulmonary:     Effort: No tachypnea, accessory muscle usage or respiratory distress.     Comments: 10L [place] Skin:    General: Skin is warm and dry.  Neurological:     Mental Status: She is easily aroused.     Comments: Awake, alert, weak    Vital Signs: BP (!) 82/43   Pulse (!) 110   Temp 98 F (36.7 C) (Oral)   Resp (!) 28   Ht '5\' 4"'  (1.626 m)   Wt 90.4 kg   LMP 09/18/2011   SpO2 93%   BMI 34.21 kg/m  Pain Scale: PAINAD   Pain Score: 0-No pain   SpO2: SpO2: 93 % O2 Device:SpO2: 93 % O2 Flow Rate: .O2 Flow Rate (L/min): 10 L/min  IO: Intake/output summary:   Intake/Output Summary (Last 24 hours) at 02/22/2020 1518 Last data filed at 02/22/2020 1400 Gross per 24 hour  Intake 560.28 ml  Output 2948 ml  Net -2387.72 ml    LBM: Last BM Date: 02/20/20 Baseline Weight: Weight: 81.6 kg Most recent weight: Weight: 90.4 kg     Palliative Assessment/Data: PPS 30%   Flowsheet Rows   Flowsheet Row Most Recent Value  Intake Tab   Referral Department Critical care  Unit at Time of Referral ICU  Palliative Care Primary Diagnosis Other (Comment)  Palliative Care Type New Palliative care  Reason for referral Clarify Goals of Care  Date first seen by Palliative Care 02/22/20  Clinical  Assessment   Palliative Performance Scale Score 30%  Psychosocial & Spiritual Assessment   Palliative Care Outcomes   Patient/Family meeting held? Yes  Who was at the meeting? husband  Palliative Care Outcomes Clarified goals of care, Provided psychosocial or spiritual support, ACP counseling assistance      Time Total: 40 Greater than 50%  of this time was spent counseling and coordinating care related to the above assessment and plan.  Signed by:  Ihor Dow, DNP, FNP-C Palliative Medicine Team  Phone: [phone] Fax: [phone]  Please contact Palliative Medicine Team phone at [phone] for questions and concerns.  For individual provider: See Shea Evans

## 2020-02-23 ENCOUNTER — Inpatient Hospital Stay (HOSPITAL_COMMUNITY): Payer: Medicare Other

## 2020-02-23 DIAGNOSIS — J9601 Acute respiratory failure with hypoxia: Secondary | ICD-10-CM | POA: Diagnosis not present

## 2020-02-23 DIAGNOSIS — I2699 Other pulmonary embolism without acute cor pulmonale: Secondary | ICD-10-CM

## 2020-02-23 DIAGNOSIS — N186 End stage renal disease: Secondary | ICD-10-CM | POA: Diagnosis not present

## 2020-02-23 DIAGNOSIS — J810 Acute pulmonary edema: Secondary | ICD-10-CM

## 2020-02-23 LAB — CBC
HCT: 31.5 % — ABNORMAL LOW (ref 36.0–46.0)
Hemoglobin: 9.9 g/dL — ABNORMAL LOW (ref 12.0–15.0)
MCH: 32.7 pg (ref 26.0–34.0)
MCHC: 31.4 g/dL (ref 30.0–36.0)
MCV: 104 fL — ABNORMAL HIGH (ref 80.0–100.0)
Platelets: 273 10*3/uL (ref 150–400)
RBC: 3.03 MIL/uL — ABNORMAL LOW (ref 3.87–5.11)
RDW: 19.9 % — ABNORMAL HIGH (ref 11.5–15.5)
WBC: 29.4 10*3/uL — ABNORMAL HIGH (ref 4.0–10.5)
nRBC: 0.6 % — ABNORMAL HIGH (ref 0.0–0.2)

## 2020-02-23 LAB — POCT I-STAT 7, (LYTES, BLD GAS, ICA,H+H)
Acid-base deficit: 19 mmol/L — ABNORMAL HIGH (ref 0.0–2.0)
Bicarbonate: 9.1 mmol/L — ABNORMAL LOW (ref 20.0–28.0)
Calcium, Ion: 1.36 mmol/L (ref 1.15–1.40)
HCT: 36 % (ref 36.0–46.0)
Hemoglobin: 12.2 g/dL (ref 12.0–15.0)
O2 Saturation: 92 %
Patient temperature: 98
Potassium: 5.3 mmol/L — ABNORMAL HIGH (ref 3.5–5.1)
Sodium: 136 mmol/L (ref 135–145)
TCO2: 10 mmol/L — ABNORMAL LOW (ref 22–32)
pCO2 arterial: 28 mmHg — ABNORMAL LOW (ref 32.0–48.0)
pH, Arterial: 7.116 — CL (ref 7.350–7.450)
pO2, Arterial: 81 mmHg — ABNORMAL LOW (ref 83.0–108.0)

## 2020-02-23 LAB — RENAL FUNCTION PANEL
Albumin: 2.1 g/dL — ABNORMAL LOW (ref 3.5–5.0)
Anion gap: 10 (ref 5–15)
BUN: 25 mg/dL — ABNORMAL HIGH (ref 8–23)
CO2: 24 mmol/L (ref 22–32)
Calcium: 9.9 mg/dL (ref 8.9–10.3)
Chloride: 101 mmol/L (ref 98–111)
Creatinine, Ser: 1.95 mg/dL — ABNORMAL HIGH (ref 0.44–1.00)
GFR, Estimated: 29 mL/min — ABNORMAL LOW (ref >60)
Glucose, Bld: 127 mg/dL — ABNORMAL HIGH (ref 70–99)
Phosphorus: 3.9 mg/dL (ref 2.5–4.6)
Potassium: 4.5 mmol/L (ref 3.5–5.1)
Sodium: 135 mmol/L (ref 135–145)

## 2020-02-23 LAB — GLUCOSE, CAPILLARY
Glucose-Capillary: 100 mg/dL — ABNORMAL HIGH (ref 70–99)
Glucose-Capillary: 102 mg/dL — ABNORMAL HIGH (ref 70–99)
Glucose-Capillary: 119 mg/dL — ABNORMAL HIGH (ref 70–99)
Glucose-Capillary: 121 mg/dL — ABNORMAL HIGH (ref 70–99)
Glucose-Capillary: 124 mg/dL — ABNORMAL HIGH (ref 70–99)
Glucose-Capillary: 132 mg/dL — ABNORMAL HIGH (ref 70–99)
Glucose-Capillary: 162 mg/dL — ABNORMAL HIGH (ref 70–99)
Glucose-Capillary: 34 mg/dL — CL (ref 70–99)
Glucose-Capillary: 87 mg/dL (ref 70–99)
Glucose-Capillary: 99 mg/dL (ref 70–99)

## 2020-02-23 LAB — LACTIC ACID, PLASMA: Lactic Acid, Venous: >11 mmol/L (ref 0.5–1.9)

## 2020-02-23 LAB — MAGNESIUM: Magnesium: 2.6 mg/dL — ABNORMAL HIGH (ref 1.7–2.4)

## 2020-02-23 LAB — APTT: aPTT: 40 seconds — ABNORMAL HIGH (ref 24–36)

## 2020-02-23 MED ORDER — FENTANYL CITRATE (PF) 100 MCG/2ML IJ SOLN: 50.0000 ug | Freq: Once | INTRAMUSCULAR | Status: DC

## 2020-02-23 MED ORDER — SODIUM BICARBONATE 8.4 % IV SOLN
INTRAVENOUS | Status: AC
  Filled 2020-02-23: qty 100

## 2020-02-23 MED ORDER — FENTANYL 2500MCG IN NS 250ML (10MCG/ML) PREMIX INFUSION
50.0000 ug/h | INTRAVENOUS | Status: DC
  Administered 2020-02-23: 100 ug/h via INTRAVENOUS
  Administered 2020-02-24: 50 ug/h via INTRAVENOUS
  Administered 2020-02-26: 200 ug/h via INTRAVENOUS
  Administered 2020-02-26 – 2020-02-27 (×2): 150 ug/h via INTRAVENOUS
  Administered 2020-02-28: 175 ug/h via INTRAVENOUS
  Administered 2020-02-28: 150 ug/h via INTRAVENOUS
  Administered 2020-02-29 – 2020-03-01 (×3): 200 ug/h via INTRAVENOUS
  Filled 2020-02-23 (×11): qty 250

## 2020-02-23 MED ORDER — ASPIRIN 81 MG PO CHEW
81.0000 mg | CHEWABLE_TABLET | Freq: Every day | ORAL | Status: DC
  Administered 2020-02-23 – 2020-02-26 (×4): 81 mg via ORAL
  Filled 2020-02-23 (×4): qty 1

## 2020-02-23 MED ORDER — KETAMINE HCL-SODIUM CHLORIDE 100-0.9 MG/10ML-% IV SOSY
150.0000 mg | PREFILLED_SYRINGE | Freq: Once | INTRAVENOUS | Status: AC
  Administered 2020-02-23: 60 mg via INTRAVENOUS
  Filled 2020-02-23 (×2): qty 20

## 2020-02-23 MED ORDER — DOCUSATE SODIUM 50 MG/5ML PO LIQD
100.0000 mg | Freq: Two times a day (BID) | ORAL | Status: DC
  Administered 2020-02-23 – 2020-02-26 (×4): 100 mg via ORAL
  Filled 2020-02-23 (×6): qty 10

## 2020-02-23 MED ORDER — SODIUM BICARBONATE 8.4 % IV SOLN
INTRAVENOUS | Status: DC
  Filled 2020-02-23 (×2): qty 850

## 2020-02-23 MED ORDER — VITAL HIGH PROTEIN PO LIQD: 1000.0000 mL | ORAL | Status: DC

## 2020-02-23 MED ORDER — ATORVASTATIN CALCIUM 80 MG PO TABS
80.0000 mg | ORAL_TABLET | Freq: Every day | ORAL | Status: DC
  Administered 2020-02-23 – 2020-02-24 (×2): 80 mg via ORAL
  Filled 2020-02-23 (×2): qty 1

## 2020-02-23 MED ORDER — DEXTROSE 50 % IV SOLN: 1.0000 | Freq: Once | INTRAVENOUS | Status: DC

## 2020-02-23 MED ORDER — FENTANYL BOLUS VIA INFUSION
50.0000 ug | INTRAVENOUS | Status: DC | PRN
  Administered 2020-02-24 – 2020-03-01 (×34): 50 ug via INTRAVENOUS
  Filled 2020-02-23: qty 50

## 2020-02-23 MED ORDER — NEPRO/CARBSTEADY PO LIQD
1000.0000 mL | ORAL | Status: DC
  Administered 2020-02-24 – 2020-02-29 (×5): 1000 mL
  Filled 2020-02-23 (×10): qty 1000

## 2020-02-23 MED ORDER — SODIUM CHLORIDE 0.9 % IV SOLN
250.0000 mL | INTRAVENOUS | Status: DC
  Administered 2020-02-27: 250 mL via INTRAVENOUS

## 2020-02-23 MED ORDER — SODIUM CHLORIDE 0.9 % IV SOLN
250.0000 mL | INTRAVENOUS | Status: DC
  Administered 2020-02-24: 250 mL via INTRAVENOUS

## 2020-02-23 MED ORDER — POLYETHYLENE GLYCOL 3350 17 G PO PACK
17.0000 g | PACK | Freq: Every day | ORAL | Status: DC
  Filled 2020-02-23: qty 1

## 2020-02-23 MED ORDER — MIDODRINE HCL 5 MG PO TABS
10.0000 mg | ORAL_TABLET | Freq: Three times a day (TID) | ORAL | Status: DC
  Administered 2020-02-23 – 2020-02-25 (×6): 10 mg via ORAL
  Filled 2020-02-23 (×5): qty 2

## 2020-02-23 MED ORDER — CHLORHEXIDINE GLUCONATE 0.12 % MT SOLN
15.0000 mL | Freq: Two times a day (BID) | OROMUCOSAL | Status: DC
  Administered 2020-02-23: 15 mL via OROMUCOSAL
  Filled 2020-02-23: qty 15

## 2020-02-23 MED ORDER — ETOMIDATE 2 MG/ML IV SOLN
INTRAVENOUS | Status: AC
  Filled 2020-02-23: qty 20

## 2020-02-23 MED ORDER — HEPARIN (PORCINE) 25000 UT/250ML-% IV SOLN
1700.0000 [IU]/h | INTRAVENOUS | Status: DC
  Administered 2020-02-23: 20:00:00 800 [IU]/h via INTRAVENOUS
  Administered 2020-02-24: 18:00:00 1000 [IU]/h via INTRAVENOUS
  Administered 2020-02-25: 12:00:00 1200 [IU]/h via INTRAVENOUS
  Administered 2020-02-26: 1300 [IU]/h via INTRAVENOUS
  Administered 2020-02-27: 1400 [IU]/h via INTRAVENOUS
  Administered 2020-02-28 (×2): 1500 [IU]/h via INTRAVENOUS
  Administered 2020-02-29: 1700 [IU]/h via INTRAVENOUS
  Filled 2020-02-23 (×8): qty 250

## 2020-02-23 MED ORDER — B COMPLEX-C PO TABS
1.0000 | ORAL_TABLET | Freq: Every day | ORAL | Status: DC
  Administered 2020-02-23 – 2020-02-25 (×3): 1 via ORAL
  Filled 2020-02-23 (×3): qty 1

## 2020-02-23 MED ORDER — PROSOURCE TF PO LIQD
45.0000 mL | Freq: Two times a day (BID) | ORAL | Status: DC
  Administered 2020-02-23: 45 mL
  Administered 2020-02-23: 20 mL
  Administered 2020-02-24 – 2020-03-01 (×13): 45 mL
  Filled 2020-02-23 (×14): qty 45

## 2020-02-23 MED ORDER — NOREPINEPHRINE 4 MG/250ML-% IV SOLN
INTRAVENOUS | Status: AC
  Administered 2020-02-23: 10 mg
  Filled 2020-02-23: qty 250

## 2020-02-23 MED ORDER — ROCURONIUM BROMIDE 10 MG/ML (PF) SYRINGE
PREFILLED_SYRINGE | INTRAVENOUS | Status: AC
  Filled 2020-02-23: qty 10

## 2020-02-23 MED ORDER — ORAL CARE MOUTH RINSE: 15.0000 mL | Freq: Two times a day (BID) | OROMUCOSAL | Status: DC

## 2020-02-23 MED ORDER — NOREPINEPHRINE 4 MG/250ML-% IV SOLN
2.0000 ug/min | INTRAVENOUS | Status: DC
  Administered 2020-02-23 – 2020-02-24 (×3): 10 ug/min via INTRAVENOUS
  Administered 2020-02-24: 5 ug/min via INTRAVENOUS
  Administered 2020-02-24: 8 ug/min via INTRAVENOUS
  Filled 2020-02-23 (×4): qty 250

## 2020-02-23 MED ORDER — SODIUM BICARBONATE 8.4 % IV SOLN
100.0000 meq | Freq: Once | INTRAVENOUS | Status: AC
  Administered 2020-02-23: 100 meq via INTRAVENOUS

## 2020-02-23 MED ORDER — TENECTEPLASE 50 MG IV KIT
45.0000 mg | PACK | INTRAVENOUS | Status: AC
  Administered 2020-02-23: 45 mg via INTRAVENOUS
  Filled 2020-02-23: qty 10

## 2020-02-23 MED ORDER — CHLORHEXIDINE GLUCONATE CLOTH 2 % EX PADS
6.0000 | MEDICATED_PAD | Freq: Every day | CUTANEOUS | Status: DC
  Administered 2020-02-25 – 2020-02-29 (×5): 6 via TOPICAL

## 2020-02-23 MED ORDER — FENTANYL CITRATE (PF) 100 MCG/2ML IJ SOLN
INTRAMUSCULAR | Status: AC
  Filled 2020-02-23: qty 2

## 2020-02-23 MED ORDER — DEXTROSE 50 % IV SOLN
INTRAVENOUS | Status: AC
  Administered 2020-02-23: 50 mL
  Filled 2020-02-23: qty 50

## 2020-02-23 MED ORDER — PROPOFOL 1000 MG/100ML IV EMUL: 0.0000 ug/kg/min | INTRAVENOUS | Status: DC

## 2020-02-23 MED ORDER — SODIUM CHLORIDE 0.9 % IV SOLN: INTRAVENOUS | Status: DC

## 2020-02-23 MED ORDER — KETAMINE HCL-SODIUM CHLORIDE 100-0.9 MG/10ML-% IV SOSY: 90.0000 mg | PREFILLED_SYRINGE | Freq: Once | INTRAVENOUS | Status: DC

## 2020-02-23 MED ORDER — MIDAZOLAM HCL 2 MG/2ML IJ SOLN
INTRAMUSCULAR | Status: AC
  Filled 2020-02-23: qty 4

## 2020-02-23 NOTE — Progress Notes (Signed)
I was called by patient's RN that her blood pressure systolic in 25K, she is confused and agitated.  I came at bedside her blood pressure was 80/35, her blood sugar was checked it was 34, she was given D50 x1. ABG was drawn showed pH 7.11, PCO2 28, PO2 81 and bicarbonate of 10.  Patient received 2 A of bicarbonate. Her blood pressure improved to systolic 27/06.  Repeat blood sugar was 124 Patient continued to remain encephalopathic, bedside echocardiogram was done which showed dilated and hypokinetic RV, compressing LV with picture of left ventricular outflow obstruction.  Patient's prior echo was done about 3 weeks ago, showing significant difference in RV size and function. Labs were drawn for blood lactate and BMP Decision was made to inject TNKase.  Patient's husband was contacted, explained patient's current situation, after explaining risk and benefit of TNKase, patient was given TNKase. Her current blood pressure is 93/57, ABG shows saturation of 92%  We will send CBC, PT PTT in 1 hour.  Patient husband was updated after infusing TNKase   Additional critical care time: 67 minutes  Performed by: [place] care time was exclusive of separately billable procedures and treating other patients.   Critical care was necessary to treat or prevent imminent or life-threatening deterioration.   Critical care was time spent personally by me on the following activities: development of treatment plan with patient and/or surrogate as well as nursing, discussions with consultants, evaluation of patient's response to treatment, examination of patient, obtaining history from patient or surrogate, ordering and performing treatments and interventions, ordering and review of laboratory studies, ordering and review of radiographic studies, pulse oximetry and re-evaluation of patient's condition.   Jacky Kindle MD [place] See Amion for pager If no response to pager,  please call [phone] until 7pm After 7pm, Please call E-link [phone]

## 2020-02-23 NOTE — Procedures (Signed)
Cortrak  Person Inserting Tube:  Tessa Seaberry, RD Tube Type:  Cortrak - 43 inches Tube Location:  Right nare Initial Placement:  Stomach Secured by: Bridle Technique Used to Measure Tube Placement:  Documented cm marking at nare/ corner of mouth Cortrak Secured At:  70 cm    No x-ray is required. RN may begin using tube.   If the tube becomes dislodged please keep the tube and contact the Cortrak team at [email or web address] (password [email or web address]) for replacement.  If after hours and replacement cannot be delayed, place a NG tube and confirm placement with an abdominal x-ray.    Sausha Raymond RD, LDN Clinical Nutrition Pager listed in AMION   

## 2020-02-23 NOTE — Progress Notes (Signed)
ANTICOAGULATION CONSULT NOTE - Initial Consult  Pharmacy Consult for IV Hepairn Indication: Suspected pulmonary emoblism post thrombolytics  No Known Allergies  Patient Measurements: Height: 5\' 4"  (162.6 cm) Weight: 87.7 kg (193 lb 5.5 oz) IBW/kg (Calculated) : 54.7 Heparin Dosing Weight: 87.7 kg  Vital Signs: BP: 89/41 (02/14 1830) Pulse Rate: 122 (02/14 1600)  Labs: Recent Labs    02/21/20 0612 02/21/20 0849 02/21/20 1105 02/22/20 1029 02/22/20 1030 02/22/20 1642 02/23/20 0337 02/23/20 1536  HGB 10.1*   < >  --   --  9.6*  --  9.9* 12.2  HCT 34.0*   < >  --   --  30.3*  --  31.5* 36.0  PLT 290  --   --   --  251  --  273  --   APTT  --   --   --  40*  --   --  40*  --   CREATININE 7.24*  --   --  3.12*  --  2.85* 1.95*  --   TROPONINIHS  --   --  277*  --   --   --   --   --    < > = values in this interval not displayed.    Estimated Creatinine Clearance: 32.5 mL/min (A) (by C-G formula based on SCr of 1.95 mg/dL (H)).   Medical History: Past Medical History:  Diagnosis Date  . Abnormal appearance of cervix 08/08/2011  . Acute respiratory failure with hypoxia ([place])   . Blurred vision 01/28/2020  . Cardiac arrest ([place]) 02/28/2018   while on hemodialysis in hospital  . CHF (congestive heart failure) ([place])   . Chronic kidney disease due to diabetes mellitus ([place]) 10/13/2013  . Community acquired bilateral lower lobe pneumonia 11/11/2019  . COVID-19 01/31/2020  . Diabetes mellitus    Type II  . ESRD (end stage renal disease) ([place])    MWF - [facility]  . Excess fluid volume 12/16/2019  . Hyperlipidemia   . Hypertension   . Neuropathy     Medications:  Infusions:  . sodium chloride Stopped (02/17/20 1636)  . sodium chloride 10 mL/hr at 02/22/20 0700  . sodium chloride    . sodium chloride    . feeding supplement (NEPRO CARB STEADY)    . sodium bicarbonate (isotonic) 150 mEq in D5W 1000 mL infusion 75 mL/hr at 02/23/20 1810    Assessment: 60 year  old female with suspected pulmonary embolism status post thrombolytics (IV Tenecteplase) to start IV Heparin.   Unable to get labs post IV Tenecteplase (given 1630PM) - tried twice with no blood able to be obtained.   Discussed with Dr. Tacy Learn - He wants to go ahead and start IV Heparin without lab work post giving lytic and now 2.5 hours out. Currently hypotensive on Levophed therapy. No sign of overt bleeding noted. Have exhausted all options for lab work. Will start conservatively.   Goal of Therapy:  Heparin level 0.3 to 0.5 units/ml for 24 hours Monitor platelets by anticoagulation protocol: Yes   Plan:  Start IV Heparin at conservative dose of 800 units/hr.  Heparin level at 8 hours.  Daily Heparin level and CBC while on therapy.   Sloan Leiter, PharmD, BCPS, BCCCP Clinical Pharmacist Please refer to [facility] for [place] numbers 02/23/2020,6:44 PM

## 2020-02-23 NOTE — Progress Notes (Signed)
Patient ID: Rachel Chaney, female   DOB: 1960/10/23, 60 y.o.   MRN: [id number] [facility] Progress Note   Assessment/ Plan:   1.  Status post PEA arrest: Secondary to healthcare associated pneumonia/sepsis preceding hospitalization.  She remains on midodrine for blood pressure support status post discontinuation of Levophed/Neo-Synephrine.  Corticosteroids being tapered down. 2. ESRD: On a Monday/Wednesday/Friday dialysis schedule as an outpatient; she was transiently on CRRT after unsuccessful transition attempts to intermittent hemodialysis for hypotension/volume removal.  I will order for hemodialysis again tomorrow (off schedule). 3. Anemia: Hemoglobin and hematocrit less likely lower, will continue monitoring with ESA. 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  Continue wound care of chronic right hip wound. 5. Nutrition: Continue tube feeds via NGT and encourage oral intake as tolerable. 6.  Acute hypoxic respiratory failure: Secondary to combination of Covid pneumonia and volume excess/pulmonary edema-we will continue CRRT at this time for volume unloading  Subjective:   Without acute events overnight, CRRT discontinued this morning   Objective:   BP (!) 82/43   Pulse 83   Temp (!) 97 F (36.1 C) (Axillary)   Resp 18   Ht 5\' 4"  (1.626 m)   Wt 87.7 kg   LMP 09/18/2011   SpO2 100%   BMI 33.19 kg/m   Physical Exam: Gen: Patient somnolent but awakens easily and attempts to engage in conversation, a little confused CVS: Pulse regular rhythm, normal rate, S1 and S2 normal Resp: Clear to auscultation without distinct rales or rhonchi Abd: Soft, obese, nontender, bowel sounds normal Ext: Left upper arm AV fistula pulsatile.  She has nonpitting 1+ bilateral lower extremity edema  Labs: BMET Recent Labs  Lab 02/17/20 7353  02/17/20 1628 02/18/20 0522 02/19/20 0453 02/20/20 0419 02/21/20 0612 02/21/20 0849 02/22/20 1029 02/22/20 1642 02/23/20 0337  NA 135  --   --  134* 135 136 133* 136 137 135  K 4.3  --   --  4.0 3.9 4.7 4.4 4.8 4.5 4.5  CL 96*  --   --  96* 98 96*  --  101 103 101  CO2 24  --   --  20* 20* 17*  --  20* 20* 24  GLUCOSE 211*  --   --  405* 186* 191*  --  156* 157* 127*  BUN 28*  --   --  50* 75* 95*  --  36* 34* 25*  CREATININE 3.75*  --   --  4.56* 6.06* 7.24*  --  3.12* 2.85* 1.95*  CALCIUM 10.8*  --   --  10.0 10.6* 10.4*  --  10.2 10.0 9.9  PHOS 4.2 4.5 4.8*  --   --  8.0*  --  5.0* 5.4* 3.9   CBC Recent Labs  Lab 02/20/20 0419 02/21/20 0612 02/21/20 0849 02/22/20 1030 02/23/20 0337  WBC 53.5* 55.7*  --  28.5* 29.4*  HGB 10.8* 10.1* 10.9* 9.6* 9.9*  HCT 33.9* 34.0* 32.0* 30.3* 31.5*  MCV 103.4* 104.9*  --  101.3* 104.0*  PLT 297 290  --  251 273     Medications:    . aspirin  81 mg Oral Daily  . atorvastatin  80 mg Oral QHS  . B-complex with vitamin C  1 tablet Oral Daily  . Chlorhexidine Gluconate Cloth  6 each Topical Q0600  . docusate  100 mg Oral BID  . heparin  5,000 Units Subcutaneous Q8H  . insulin aspart  0-20 Units Subcutaneous Q4H  . midodrine  10 mg Oral TID WC  . polyethylene glycol  17 g Oral Daily  . silver sulfADIAZINE   Topical BID   Elmarie Shiley, MD 02/23/2020, 9:24 AM

## 2020-02-23 NOTE — Progress Notes (Signed)
eLink Physician-Brief Progress Note Patient Name: Rachel Chaney DOB: 1960/09/09 MRN: [id number]   Date of Service  02/23/2020  HPI/Events of Note  Hypotension - BP = 86/48. Nursing request to restart Norepinephrine IV infusion via PIV.  eICU Interventions  Plan: 1. Norepinephrine IV infusion via PIV. Titrate to MAP >= 65.      Intervention Category Major Interventions: Hypotension - evaluation and management  Fahmida Jurich Eugene 02/23/2020, 8:03 PM

## 2020-02-23 NOTE — Plan of Care (Signed)
  Problem: Education: Goal: Knowledge of General Education information will improve Description: Including pain rating scale, medication(s)/side effects and non-pharmacologic comfort measures Outcome: Not Progressing   Problem: Health Behavior/Discharge Planning: Goal: Ability to manage health-related needs will improve Outcome: Not Progressing   

## 2020-02-23 NOTE — Evaluation (Signed)
Occupational Therapy Evaluation Patient Details Name: Rachel Chaney MRN: [id number] DOB: 1960-08-05 Today's Date: 02/23/2020    History of Present Illness 60 yo admitted 2/2 with leg pain and suffered PEA arrest during HD 2/4. PMhx: Covid + 01/31/20, chronic respiratory failure, ESRD MWF, DM, HFpEF, HTN, HLD, CAD s/p MI   Clinical Impression   Pt PTA: From home with family, was independent per chart. Currently, pt oriented to self and place; pt requiring cues for birthday and unclear on situation. Pt lethargic throughout session requiring assist to stay awake. Pt with deficits in cardiopulmonary tolerance, cognition and decreased ability to care for self. Pt with very poor activity tolerance. Pt rolling side to side and max A; pt favors rolling to L side; totalA to scoot to [facility]. Pt minA to [place] for ADL and maxA for rolling in bed. Pt unable to arouse fully to sit EOB.  Pt on 5LO2 >90% throughout; HR 117 BPM with exertiona nd BP 106/59 after activity. Pt would benefit from continued OT skilled services with ADL, cognition, and safety. OT following.    Follow Up Recommendations  CIR;Supervision/Assistance - 24 hour    Equipment Recommendations  3 in 1 bedside commode    Recommendations for Other Services Rehab consult     Precautions / Restrictions Precautions Precautions: Fall Restrictions Weight Bearing Restrictions: No      Mobility Bed Mobility Overal bed mobility: Needs Assistance Bed Mobility: Rolling Rolling: Max assist         General bed mobility comments: Pt rolling side to side and max A; pt favors rolling to L side; totalA to scoot to [facility]. Pt    Transfers                 General transfer comment: deferred    Balance                                           ADL either performed or assessed with clinical judgement   ADL Overall ADL's : Needs assistance/impaired Eating/Feeding: Bed level Eating/Feeding Details (indicate cue  type and reason): assist for self feeding thickened liquids; pt unsafe with food at this time Grooming: Moderate assistance;Wash/dry face;Bed level Grooming Details (indicate cue type and reason): hand over hand assist Upper Body Bathing: Moderate assistance;Bed level   Lower Body Bathing: Total assistance;Bed level   Upper Body Dressing : Moderate assistance;Bed level   Lower Body Dressing: Total assistance;Bed level   Toilet Transfer: Total assistance;+2 for physical assistance;+2 for safety/equipment Toilet Transfer Details (indicate cue type and reason): would use lift Toileting- Clothing Manipulation and Hygiene: Total assistance Toileting - Clothing Manipulation Details (indicate cue type and reason): pure wick and flex seal     Functional mobility during ADLs: Maximal assistance (bed mobility) General ADL Comments: Pt with deficits in cardiopulmonary tolerance, cognition and decreased ability to care for self. Pt with very poor activity tolerance.     Vision Baseline Vision/History: Wears glasses Wears Glasses: At all times Patient Visual Report: No change from baseline Vision Assessment?: No apparent visual deficits     Perception     Praxis      Pertinent Vitals/Pain Pain Assessment: No/denies pain     Hand Dominance Right   Extremity/Trunk Assessment Upper Extremity Assessment Upper Extremity Assessment: Generalized weakness RUE Deficits / Details: spontaneously moving elbow and hand, poor grip strength 2/5 MM grade LUE  Deficits / Details: Pt spontaneously moving LUE shoulder through digits and 3/5 MM grade, poor grip strength 2/5 MM grade   Lower Extremity Assessment Lower Extremity Assessment: Defer to PT evaluation RLE Deficits / Details: femoral line taken out; pt able to resist against OTR in knee flex to extension; pt with RLE transmet LLE Deficits / Details: difficult to assess; 2/5 grossly       Communication Communication Communication: Expressive  difficulties (pt not speaking today; reacting)   Cognition Arousal/Alertness: Lethargic Behavior During Therapy: Flat affect Overall Cognitive Status: Impaired/Different from baseline Area of Impairment: Orientation;Attention;Memory;Safety/judgement;Problem solving                 Orientation Level: Disoriented to;Time;Situation Current Attention Level: Focused Memory: Decreased short-term memory Following Commands: Follows one step commands inconsistently;Follows one step commands with increased time Safety/Judgement: Decreased awareness of safety;Decreased awareness of deficits   Problem Solving: Slow processing;Requires verbal cues;Requires tactile cues General Comments: Pt oriented to self and place; pt requiring cues for birthday and unclear on situation. Pt lethargic throughout session requiring assist to stay awake throughout session. Pt appears to favor her R side.   General Comments  5 LO2 >90% throughout; HR 117 BPM with exertiona nd BP 106/59 after activity.    Exercises Exercises: General Lower Extremity;General Upper Extremity General Exercises - Upper Extremity Shoulder Flexion: AAROM;Both;Supine;10 reps Elbow Flexion: AAROM;Both;Supine;10 reps Elbow Extension: AAROM;Both;Supine;10 reps Digit Composite Flexion: AROM;Both;10 reps;Supine Composite Extension: AROM;Both;10 reps;Supine General Exercises - Lower Extremity Short Arc Quad: AROM;Supine;10 reps;Both   Shoulder Instructions      Home Living Family/patient expects to be discharged to:: Private residence Living Arrangements: Spouse/significant other Available Help at Discharge: Family;Available 24 hours/day Type of Home: House Home Access: Stairs to enter [employer] of Steps: 4 Entrance Stairs-Rails: Right Home Layout: One level     Bathroom Shower/Tub: Teacher, early years/pre: Standard Bathroom Accessibility: Yes   Home Equipment: Environmental consultant - 2 wheels;Cane - single point;Grab  bars - tub/shower;Wheelchair - Insurance claims handler - 4 wheels;Hand held shower head          Prior Functioning/Environment Level of Independence: Independent with assistive device(s)        Comments: Pt unable to communicate and spouse not in room        OT Problem List: Decreased strength;Decreased activity tolerance;Impaired balance (sitting and/or standing);Decreased cognition;Decreased safety awareness;Decreased knowledge of use of DME or AE;Cardiopulmonary status limiting activity;Pain      OT Treatment/Interventions: Therapeutic exercise;Self-care/ADL training;Energy conservation;DME and/or AE instruction;Therapeutic activities;Balance training;Patient/family education;Cognitive remediation/compensation    OT Goals(Current goals can be found in the care plan section) Acute Rehab OT Goals Patient Stated Goal: none stated OT Goal Formulation: Patient unable to participate in goal setting Time For Goal Achievement: 03/08/20 Potential to Achieve Goals: Good ADL Goals Pt Will Perform Grooming: with min assist Pt Will Perform Upper Body Dressing: with min assist;sitting Pt Will Transfer to Toilet: with mod assist;squat pivot transfer;bedside commode Pt/caregiver will Perform Home Exercise Program: Increased strength;Both right and left upper extremity;With written HEP provided;With Supervision Additional ADL Goal #1: Pt will perform OOB ADL tasks with O2 sats >90% on RA with 2 seated rest breaks. Additional ADL Goal #2: Pt will complete higher level cognitive task with minimal verbal cues to increase safety awareness.  OT Frequency: Min 2X/week   Barriers to D/C:            Co-evaluation  AM-PAC OT "6 Clicks" Daily Activity     Outcome Measure Help from another person eating meals?: Total Help from another person taking care of personal grooming?: A Lot Help from another person toileting, which includes using toliet, bedpan, or urinal?: Total Help  from another person bathing (including washing, rinsing, drying)?: Total Help from another person to put on and taking off regular upper body clothing?: A Lot Help from another person to put on and taking off regular lower body clothing?: Total 6 Click Score: 8   End of Session Equipment Utilized During Treatment: Oxygen Nurse Communication: Mobility status  Activity Tolerance: Patient limited by lethargy;Treatment limited secondary to medical complications (Comment) Patient left: in bed;with call bell/phone within reach;with nursing/sitter in room  OT Visit Diagnosis: Unsteadiness on feet (R26.81);Muscle weakness (generalized) (M62.81);Other symptoms and signs involving cognitive function                Time: 1222-1239 OT Time Calculation (min): 17 min Charges:  OT General Charges $OT Visit: 1 Visit OT Evaluation $OT Eval Moderate Complexity: 1 Mod  Jefferey Pica, OTR/L Acute Rehabilitation Services Pager: [phone] Office: [phone]    Jaesean Litzau C 02/23/2020, 5:05 PM

## 2020-02-23 NOTE — Progress Notes (Signed)
Administered 60mg  IVP ketamine. Wasted 90mg  Ketamine. Witnessed by Zandra Abts via stericycle.

## 2020-02-23 NOTE — Progress Notes (Signed)
eLink Physician-Brief Progress Note Patient Name: Rachel Chaney DOB: 06-03-1960 MRN: [id number]   Date of Service  02/23/2020  HPI/Events of Note  Patient coded transiently post intubation. Now with ROSC. I suspect that she remains fairly acidemic. Nursing reports that pupuls are not reactive.   eICU Interventions  Plan: 1. ABG STAT,=. 2. Head CT Scan w/o contrast STAT. 3. Will ask PCCM ground team to reassess the patient at bedside.      Intervention Category Major Interventions: Code management / supervision  Lysle Dingwall 02/23/2020, 11:50 PM

## 2020-02-23 NOTE — Op Note (Addendum)
Intubation Procedure Note  KIMORAH RIDOLFI  [id number]  1960/01/17  Date:02/23/20  Time:11:06 PM   Provider Performing:Jermiya Reichl R Edlin Ford    Procedure: Intubation (31500)  Indication(s) Respiratory Failure  Consent Unable to obtain consent due to emergent nature of procedure.   Anesthesia Ketamine 60mg    Time Out Verified patient identification, verified procedure, site/side was marked, verified correct patient position, special equipment/implants available, medications/allergies/relevant history reviewed, required imaging and test results available.   Sterile Technique Usual hand hygeine, masks, and gloves were used   Procedure Description Patient positioned in bed semi -supine 20degree .  Sedation given as noted above.  Patient was intubated with endotracheal tube using 3 Miller.  View was Grade 1 full glottis .  Number of attempts was 1.  Colorimetric CO2 detector was consistent with tracheal placement.   Complications/Tolerance None; patient tolerated the procedure well. Chest X-ray is ordered to verify placement.   EBL none   Specimen(s) None  Family: Updated the pt husband about the change in his wife's condition and explained the emergent intubation.  Answered all questions.

## 2020-02-23 NOTE — Progress Notes (Addendum)
Nutrition Follow-up  DOCUMENTATION CODES:   Obesity unspecified  INTERVENTION:   -Once feeding access is established:  Initiate Nepro @ 50 ml/hr via cortrak tube (1200 ml daily)  45 ml Prosoure TF BID.    Tube feeding regimen provides 2240 kcal (100% of needs), 119 grams of protein, and 873 ml of H2O.   -Continue b-complex with vitamin C  NUTRITION DIAGNOSIS:   Inadequate oral intake related to inability to eat as evidenced by NPO status.  Ongoing  GOAL:   Patient will meet greater than or equal to 90% of their needs  Unmet; plan cortrak placement and TF today  MONITOR:   Diet advancement,Labs,Weight trends,TF tolerance,Skin,I & O's  REASON FOR ASSESSMENT:   Ventilator    ASSESSMENT:   Rachel Chaney is a46 y.o. female with a pPMH of ESRD on HD (MWF), Chronic hypoxic respiratory failure, T2DM, HFpEF, HTN, HLD, CAD s/p MI (2020) who presented to [facility] with chronic nonspecific skin pain over her bilateral lower extremities and her hospitalization has been complicated by acute on chronic hypoxic respiratory failure and hypotension.  2/2- admitted for leg pain, found to be hypoxic and hypotensive 2/4- s/p PEA arrest 2/7- TF initiated, CRRT d/c 2/9- transitioned to [facility] 2/11- rectal tube placed, extubated 2/13- s/p BSE- recommend continue NPO status  Reviewed I/O's: -3.1 L x 24 hours and -3.7 L snce admission  Rectal tube output: 100 ml x 24 hours  Per MD notes, pt is very deconiditioned and weak, but a little more alert today. She failed her swallow evaluation yesterday. Case discussed with cortrak RD; plan for cortrak placement today.   Medications reviewed and include colace and miralax.   Labs reviewed: Mg: 2.6, CBGS: 100-121 (inpatient orders for glycemic control are 0-20 units insulin aspart every 4 hours).   Diet Order:   Diet Order            DIET - DYS 1 Room service appropriate? Yes with Assist; Fluid consistency: Nectar Thick  Diet effective now                  EDUCATION NEEDS:   Not appropriate for education at this time  Skin:  Skin Assessment: Skin Integrity Issues: Skin Integrity Issues:: Unstageable Unstageable: rt thigh  Last BM:  02/23/20 (via rectal tube)  Height:   Ht Readings from Last 1 Encounters:  02/16/20 5\' 4"  (1.626 m)    Weight:   Wt Readings from Last 1 Encounters:  02/23/20 87.7 kg    Ideal Body Weight:  54.5 kg  BMI:  Body mass index is 33.19 kg/m.  Estimated Nutritional Needs:   Kcal:  2000-2200  Protein:  120-135 grams  Fluid:  1000 ml + UOP    Loistine Chance, RD, LDN, [place] Registered Dietitian II Certified Diabetes Care and Education Specialist Please refer to [facility] for RD and/or RD on-call/weekend/after hours pager

## 2020-02-23 NOTE — Progress Notes (Signed)
NAMECHARISH Rachel Chaney, MRN:  [id number], DOB:  06/14/1960, LOS: 88 ADMISSION DATE:  02/12/2020, CONSULTATION DATE:  02/18/2020 REFERRING MD:  FMTS, CHIEF COMPLAINT:  PEA arrest    Brief History:  Rachel Rachel Chaney is a 60 y.o. female with a pPMH of ESRD on HD (MWF), Chronic hypoxic respiratory failure, T2DM, HFpEF, HTN, HLD, CAD s/p MI (2020) who presented to [facility] with chronic nonspecific skin pain over her bilateral lower extremities and her hospitalization has been complicated by acute on chronic hypoxic respiratory failure and hypotension.   Patient had PEA cardiac arrest during dialysis on 2/4, she was transferred to ICU. She was covid positive prior to this admit on 01/31/20  Past Medical History:  ESRD  Type II diabetes  HLD HTN Cardiac arrest  CHF  Significant Hospital Events:  02/02 - Admitted for leg pain found hypoxic and hypotensive.  02/04 - PEA arrest xxxxxxx 2./11 - Precedex weaned off this morning Remains on insulin drip but likely can be transitioned off Norepinephrine 3. 0.50, currently on pressure support ventilation. Scheduled for hemodialysis later today  2/12 - 2/12 - remains extubated  since yesterday.  Overnight  Needing pressors.  Ongoping challenges with measuring BP -> being measured RLE. On [place] -o2 -followgn some commands. Deconditioned and weak. Difffuse pain per RN  Consults:  Nephrology  Cardiology  Procedures:  ETT 2/4> 2/11  Significant Diagnostic Tests:  01/23 Echo > 60 to 65%  02/02 CXR > Patchy BILATERAL pulmonary infiltrates favor multifocal pneumonia over pulmonary edema.  02/02 CT Abdomen/Pelvis > No acute abnormality. Multiple chronic findings as detailed above, not substantially changed from recent prior study.  02/02 CT left and right femur > No acute osseous abnormality. There is nonspecific lower extremity edema without evidence for an abscess. Findings are nonspecific but can be seen in patients with cellulitis in the appropriate clinical  setting. Other differential considerations include chronic venous insufficiency given the presence of subcutaneous calcifications.  Micro Data:  01/31/20 - covid positive xxxx MRSA PCR 2/4 > neg Blood culture 2/4 > neg Respiratory 2/4 > neg  Antimicrobials:  Zosyn 2/4 > 2/11 Vancomycin 2/2 > 2/7  Interim History / Subjective:  CRRT was stopped this morning, patient had swallow reevaluation test, recommend pured diet, she has been tolerating well so far Blood pressure cuff and A-line blood pressure not matching on right upper arm A-line and femoral HD catheter was removed  Objective   Blood pressure (!) 82/43, pulse 83, temperature (!) 97 F (36.1 C), temperature source Axillary, resp. rate 18, height 5\' 4"  (1.626 m), weight 87.7 kg, last menstrual period 09/18/2011, SpO2 100 %.        Intake/Output Summary (Last 24 hours) at 02/23/2020 1149 Last data filed at 02/23/2020 0700 Gross per 24 hour  Intake 240.03 ml  Output 2884 ml  Net -2643.97 ml   Filed Weights   02/21/20 0500 02/22/20 0500 02/23/20 0436  Weight: 92.4 kg 90.4 kg 87.7 kg      General Appearance: Middle-age obese African-American female, lying on the bed. Head:  Normocephalic, without obvious abnormality, atraumatic Eyes:  PERRL - yes, conjunctiva/corneas - muddy     Ears:  Normal external ear canals, both ears Neck:  Supple,  No enlargement/tenderness/nodules Lungs: Clear to auscultation bilaterally, Distant breath sound Heart:  S1 and S2 normal, no murmur Abdomen:  Soft, no masses, no organomegaly Skin:  ntact in exposed areas . Sacral area - decub stage 4 per RN Neurologic: Opens eyes with  vocal stimuli, following simple commands, still confused   Resolved Hospital Problem list    HCAP ./sp Zosyn ending 2/11 -  7 day course Acute metabolic encephalopathy 2/94/7654 and off Precedex  Assessment & Plan:  COVID-19 - 01/31/20 covid IgG  - reactive 02/21/20  Status post PEA Arrest presumably due to  Septic Shock that was present on admission Circulatory shock Patient's shock has resolved IV hydrocortisone was stopped Off vasopressors Completed antibiotic therapy for 7 days Decrease midodrine to 10 mg 3 times daily  Acute Hypoxic Respiratory Failure due to covid-19 s/p extubation 2/11 Continued require heated high flow nasal cannula oxygen Completed treatment for Covid with a steroid and remdesivir  ESRD on HD MWF CRRT was stopped this morning Patient will receive IHD in the morning  Anemia of chronic and critical illness H&H is stable Transfuse if less than 7  T2DM Continue sliding scale insulin CBG goal of 140-180  Best practice (evaluated daily)  Diet: Pured diet Pain/Anxiety/Delirium protocol (if indicated): none VAP protocol (if indicated): In place  DVT prophylaxis: Heparin SQ GI prophylaxis: h2 blockade Glucose control: SSI Mobility: Bedrest Disposition:ICU Family communication: Patient's husband was updated at bedside Code Status: Full  LABS    PULMONARY Recent Labs  Lab 02/21/20 0849  PHART 7.284*  PCO2ART 41.2  PO2ART 66*  HCO3 19.4*  TCO2 21*  O2SAT 89.0    CBC Recent Labs  Lab 02/21/20 0612 02/21/20 0849 02/22/20 1030 02/23/20 0337  HGB 10.1* 10.9* 9.6* 9.9*  HCT 34.0* 32.0* 30.3* 31.5*  WBC 55.7*  --  28.5* 29.4*  PLT 290  --  251 273    COAGULATION No results for input(s): INR in the last 168 hours.  CARDIAC  No results for input(s): TROPONINI in the last 168 hours. No results for input(s): PROBNP in the last 168 hours.   CHEMISTRY Recent Labs  Lab 02/18/20 0522 02/19/20 0453 02/20/20 0419 02/21/20 0612 02/21/20 0849 02/22/20 1029 02/22/20 1642 02/23/20 0337  NA  --  134* 135 136 133* 136 137 135  K  --  4.0 3.9 4.7 4.4 4.8 4.5 4.5  CL  --  96* 98 96*  --  101 103 101  CO2  --  20* 20* 17*  --  20* 20* 24  GLUCOSE  --  405* 186* 191*  --  156* 157* 127*  BUN  --  50* 75* 95*  --  36* 34* 25*  CREATININE  --   4.56* 6.06* 7.24*  --  3.12* 2.85* 1.95*  CALCIUM  --  10.0 10.6* 10.4*  --  10.2 10.0 9.9  MG 2.5* 2.3 2.4 2.6*  --  2.4  --  2.6*  PHOS 4.8*  --   --  8.0*  --  5.0* 5.4* 3.9   Estimated Creatinine Clearance: 32.5 mL/min (A) (by C-G formula based on SCr of 1.95 mg/dL (H)).   LIVER Recent Labs  Lab 02/20/20 0419 02/22/20 1029 02/22/20 1642 02/23/20 0337  AST 33 23  --   --   ALT 29 27  --   --   ALKPHOS 129* 102  --   --   BILITOT 1.0 1.0  --   --   PROT 6.7 6.3*  --   --   ALBUMIN 1.9* 2.0* 2.0* 2.1*     INFECTIOUS Recent Labs  Lab 02/21/20 1105 02/22/20 1030  LATICACIDVEN 1.7 1.5     ENDOCRINE CBG (last 3)  Recent Labs    02/23/20 0023  02/23/20 0342 02/23/20 0746  GLUCAP 119* 121* 100*    Total critical care time: 35 minutes  Performed by: [place] care time was exclusive of separately billable procedures and treating other patients.   Critical care was necessary to treat or prevent imminent or life-threatening deterioration.   Critical care was time spent personally by me on the following activities: development of treatment plan with patient and/or surrogate as well as nursing, discussions with consultants, evaluation of patient's response to treatment, examination of patient, obtaining history from patient or surrogate, ordering and performing treatments and interventions, ordering and review of laboratory studies, ordering and review of radiographic studies, pulse oximetry and re-evaluation of patient's condition.   Jacky Kindle MD [place] [facility] See Amion for pager If no response to pager, please call [phone] until 7pm After 7pm, Please call E-link [phone]

## 2020-02-23 NOTE — Progress Notes (Signed)
eLink Physician-Brief Progress Note Patient Name: Rachel Chaney DOB: 1960/03/21 MRN: [id number]   Date of Service  02/23/2020  HPI/Events of Note  Increased WOB with SVT. No w back in sinus tachycardia with HR = 113. RR = 25 and Sat = 98%.  eICU Interventions  Plan: 1. Trial of BiPAP.  2. Will ask PCCM ground team to evaluate for possible intubation.      Intervention Category Major Interventions: Hypoxemia - evaluation and management  Rachel Chaney 02/23/2020, 10:10 PM

## 2020-02-23 NOTE — Progress Notes (Signed)
Inpatient Rehab Admissions Coordinator Note:   Per PT recommendation, pt was screened for CIR candidacy by Gayland Curry, MS, CCC-SLP.  At this time we are not recommending an inpatient rehab consult. Pt's medical work pending (awaiting removal of CRRT).  Will continue to follow progress with therapies.  Please contact me with questions.    Gayland Curry, [place], [place] Admissions Coordinator [phone] 02/23/20 9:52 AM

## 2020-02-23 NOTE — Progress Notes (Signed)
  Speech Language Pathology Treatment: Dysphagia  Patient Details Name: Rachel Chaney MRN: [id number] DOB: [birth year] Today's Date: 02/23/2020 Time: 1761-6073 SLP Time Calculation (min) (ACUTE ONLY): 30 min  Assessment / Plan / Recommendation Clinical Impression  Pt was seen at bedside for follow up after BSE completed 02/22/20. RN reports pt can be positioned upright today. Husband is present in the room. Pt awake and cooperative, but appears quite weak. Volitional cough is also weak. Oral care completed with suction, which pt tolerated well. After oral care, pt accepted trials of individual ice chips x3, puree, nectar thick liquid, and thin liquid. No overt s/s aspiration observed on any consistency, however, pt is at increased risk for aspiration given significant weakness, head position (looking up at the ceiling if not cued to drop her chin), and extended intubation. Recommend beginning conservative diet of puree and nectar thick liquids when pt is fully awake and alert, seated at 90 degrees. Pt may have difficulty meeting nutritional needs by mouth, and would benefit from consult with dietician for recommendations for appropriate supplements. Safe swallow precautions posted at [facility]. SLP will follow to assess tolerance of current diet, continue education, and determine readiness to advance textures or complete instrumental study. RN and MD informed.     HPI HPI: Patient is a 60 y.o. female with PMH: ESRD on HD M/W/F, chronic hypoxic respiratory failure, DM-2, HFpEFHTN, h/o MI, HLD. She was brought to ED on 2/2 for increased pain on her skin lesions. Initially, she was stable on RA, but later noted to be hypoxic at 76% SpO2. Patient reportedly uses 3L O2 at home but did not bring with her to ED. She tested positive for Covid-19 on 01/31/2020. CXR revealed patchy bilateral infiltrates, c/w multifocal PNA likely due to recent Covid infection. on 2/4 patient was yelling out, then became unresponsive and  CODE BLUE was called. She was transferred to ICU and intubated. She was then extubated on 2/11.      SLP Plan  Continue with current plan of care       Recommendations  Diet recommendations: Dysphagia 1 (puree);Nectar-thick liquid Liquids provided via: Straw Medication Administration: Whole meds with puree Supervision: Full supervision/cueing for compensatory strategies;Staff to assist with self feeding Compensations: Minimize environmental distractions;Slow rate;Small sips/bites Postural Changes and/or Swallow Maneuvers: Seated upright 90 degrees;Upright 30-60 min after meal                Oral Care Recommendations: Oral care BID Follow up Recommendations: Other (comment) (TBD) SLP Visit Diagnosis: Dysphagia, unspecified (R13.10) Plan: Continue with current plan of care       Franks Field. Quentin Ore, [facility], [place] Speech Language Pathologist Office: [phone] Pager: [phone]  Shonna Chock 02/23/2020, 9:25 AM

## 2020-02-24 ENCOUNTER — Inpatient Hospital Stay (HOSPITAL_COMMUNITY): Payer: Medicare Other

## 2020-02-24 DIAGNOSIS — L8990 Pressure ulcer of unspecified site, unspecified stage: Secondary | ICD-10-CM | POA: Insufficient documentation

## 2020-02-24 DIAGNOSIS — N186 End stage renal disease: Secondary | ICD-10-CM | POA: Diagnosis not present

## 2020-02-24 DIAGNOSIS — R6521 Severe sepsis with septic shock: Secondary | ICD-10-CM | POA: Diagnosis not present

## 2020-02-24 DIAGNOSIS — A419 Sepsis, unspecified organism: Secondary | ICD-10-CM | POA: Diagnosis not present

## 2020-02-24 DIAGNOSIS — J810 Acute pulmonary edema: Secondary | ICD-10-CM | POA: Diagnosis not present

## 2020-02-24 DIAGNOSIS — J9601 Acute respiratory failure with hypoxia: Secondary | ICD-10-CM | POA: Diagnosis not present

## 2020-02-24 LAB — BASIC METABOLIC PANEL
Anion gap: 31 — ABNORMAL HIGH (ref 5–15)
BUN: 38 mg/dL — ABNORMAL HIGH (ref 8–23)
CO2: 11 mmol/L — ABNORMAL LOW (ref 22–32)
Calcium: 11 mg/dL — ABNORMAL HIGH (ref 8.9–10.3)
Chloride: 100 mmol/L (ref 98–111)
Creatinine, Ser: 3.33 mg/dL — ABNORMAL HIGH (ref 0.44–1.00)
GFR, Estimated: 15 mL/min — ABNORMAL LOW (ref >60)
Glucose, Bld: 150 mg/dL — ABNORMAL HIGH (ref 70–99)
Potassium: 6.3 mmol/L (ref 3.5–5.1)
Sodium: 142 mmol/L (ref 135–145)

## 2020-02-24 LAB — GLUCOSE, CAPILLARY
Glucose-Capillary: 172 mg/dL — ABNORMAL HIGH (ref 70–99)
Glucose-Capillary: 192 mg/dL — ABNORMAL HIGH (ref 70–99)
Glucose-Capillary: 205 mg/dL — ABNORMAL HIGH (ref 70–99)
Glucose-Capillary: 255 mg/dL — ABNORMAL HIGH (ref 70–99)
Glucose-Capillary: 260 mg/dL — ABNORMAL HIGH (ref 70–99)
Glucose-Capillary: 287 mg/dL — ABNORMAL HIGH (ref 70–99)
Glucose-Capillary: 303 mg/dL — ABNORMAL HIGH (ref 70–99)

## 2020-02-24 LAB — CBC
HCT: 39.8 % (ref 36.0–46.0)
HCT: 34.5 % — ABNORMAL LOW (ref 36.0–46.0)
Hemoglobin: 11.6 g/dL — ABNORMAL LOW (ref 12.0–15.0)
Hemoglobin: 9.9 g/dL — ABNORMAL LOW (ref 12.0–15.0)
MCH: 32.7 pg (ref 26.0–34.0)
MCH: 31.4 pg (ref 26.0–34.0)
MCHC: 29.1 g/dL — ABNORMAL LOW (ref 30.0–36.0)
MCHC: 28.7 g/dL — ABNORMAL LOW (ref 30.0–36.0)
MCV: 112.1 fL — ABNORMAL HIGH (ref 80.0–100.0)
MCV: 109.5 fL — ABNORMAL HIGH (ref 80.0–100.0)
Platelets: 343 10*3/uL (ref 150–400)
Platelets: 303 10*3/uL (ref 150–400)
RBC: 3.55 MIL/uL — ABNORMAL LOW (ref 3.87–5.11)
RBC: 3.15 MIL/uL — ABNORMAL LOW (ref 3.87–5.11)
RDW: 20.4 % — ABNORMAL HIGH (ref 11.5–15.5)
RDW: 20 % — ABNORMAL HIGH (ref 11.5–15.5)
WBC: 82.3 10*3/uL (ref 4.0–10.5)
WBC: 62.9 10*3/uL (ref 4.0–10.5)
nRBC: 3.6 % — ABNORMAL HIGH (ref 0.0–0.2)
nRBC: 5.6 % — ABNORMAL HIGH (ref 0.0–0.2)

## 2020-02-24 LAB — POCT I-STAT 7, (LYTES, BLD GAS, ICA,H+H)
Acid-Base Excess: 8 mmol/L — ABNORMAL HIGH (ref 0.0–2.0)
Acid-base deficit: 17 mmol/L — ABNORMAL HIGH (ref 0.0–2.0)
Bicarbonate: 13.9 mmol/L — ABNORMAL LOW (ref 20.0–28.0)
Bicarbonate: 37 mmol/L — ABNORMAL HIGH (ref 20.0–28.0)
Calcium, Ion: 1.3 mmol/L (ref 1.15–1.40)
Calcium, Ion: 1.33 mmol/L (ref 1.15–1.40)
HCT: 30 % — ABNORMAL LOW (ref 36.0–46.0)
HCT: 34 % — ABNORMAL LOW (ref 36.0–46.0)
Hemoglobin: 10.2 g/dL — ABNORMAL LOW (ref 12.0–15.0)
Hemoglobin: 11.6 g/dL — ABNORMAL LOW (ref 12.0–15.0)
O2 Saturation: 94 %
O2 Saturation: 99 %
Patient temperature: 98.6
Patient temperature: 98.9
Potassium: 4 mmol/L (ref 3.5–5.1)
Potassium: 4.9 mmol/L (ref 3.5–5.1)
Sodium: 137 mmol/L (ref 135–145)
Sodium: 140 mmol/L (ref 135–145)
TCO2: 16 mmol/L — ABNORMAL LOW (ref 22–32)
TCO2: 39 mmol/L — ABNORMAL HIGH (ref 22–32)
pCO2 arterial: 55.3 mmHg — ABNORMAL HIGH (ref 32.0–48.0)
pCO2 arterial: 75.9 mmHg (ref 32.0–48.0)
pH, Arterial: 7.009 — CL (ref 7.350–7.450)
pH, Arterial: 7.296 — ABNORMAL LOW (ref 7.350–7.450)
pO2, Arterial: 225 mmHg — ABNORMAL HIGH (ref 83.0–108.0)
pO2, Arterial: 84 mmHg (ref 83.0–108.0)

## 2020-02-24 LAB — RENAL FUNCTION PANEL
Albumin: 2 g/dL — ABNORMAL LOW (ref 3.5–5.0)
Albumin: 1.9 g/dL — ABNORMAL LOW (ref 3.5–5.0)
Anion gap: 16 — ABNORMAL HIGH (ref 5–15)
Anion gap: 14 (ref 5–15)
BUN: 47 mg/dL — ABNORMAL HIGH (ref 8–23)
BUN: 48 mg/dL — ABNORMAL HIGH (ref 8–23)
CO2: 28 mmol/L (ref 22–32)
CO2: 32 mmol/L (ref 22–32)
Calcium: 9.7 mg/dL (ref 8.9–10.3)
Calcium: 9 mg/dL (ref 8.9–10.3)
Chloride: 96 mmol/L — ABNORMAL LOW (ref 98–111)
Chloride: 94 mmol/L — ABNORMAL LOW (ref 98–111)
Creatinine, Ser: 3.67 mg/dL — ABNORMAL HIGH (ref 0.44–1.00)
Creatinine, Ser: 3.54 mg/dL — ABNORMAL HIGH (ref 0.44–1.00)
GFR, Estimated: 13 mL/min — ABNORMAL LOW (ref >60)
GFR, Estimated: 14 mL/min — ABNORMAL LOW (ref >60)
Glucose, Bld: 342 mg/dL — ABNORMAL HIGH (ref 70–99)
Glucose, Bld: 223 mg/dL — ABNORMAL HIGH (ref 70–99)
Phosphorus: 6.9 mg/dL — ABNORMAL HIGH (ref 2.5–4.6)
Phosphorus: 5.6 mg/dL — ABNORMAL HIGH (ref 2.5–4.6)
Potassium: 4.2 mmol/L (ref 3.5–5.1)
Potassium: 3.6 mmol/L (ref 3.5–5.1)
Sodium: 140 mmol/L (ref 135–145)
Sodium: 140 mmol/L (ref 135–145)

## 2020-02-24 LAB — PROTIME-INR
INR: 2.8 — ABNORMAL HIGH (ref 0.8–1.2)
INR: 2.3 — ABNORMAL HIGH (ref 0.8–1.2)
Prothrombin Time: 28.6 seconds — ABNORMAL HIGH (ref 11.4–15.2)
Prothrombin Time: 24.4 seconds — ABNORMAL HIGH (ref 11.4–15.2)

## 2020-02-24 LAB — MRSA PCR SCREENING: MRSA by PCR: NEGATIVE

## 2020-02-24 LAB — APTT
aPTT: 96 seconds — ABNORMAL HIGH (ref 24–36)
aPTT: 73 seconds — ABNORMAL HIGH (ref 24–36)

## 2020-02-24 LAB — C DIFFICILE (CDIFF) QUICK SCRN (NO PCR REFLEX)
C Diff antigen: NEGATIVE
C Diff interpretation: NOT DETECTED
C Diff toxin: NEGATIVE

## 2020-02-24 LAB — HEPARIN LEVEL (UNFRACTIONATED)
Heparin Unfractionated: 0.14 IU/mL — ABNORMAL LOW (ref 0.30–0.70)
Heparin Unfractionated: 0.11 IU/mL — ABNORMAL LOW (ref 0.30–0.70)

## 2020-02-24 LAB — MAGNESIUM: Magnesium: 2.6 mg/dL — ABNORMAL HIGH (ref 1.7–2.4)

## 2020-02-24 LAB — LACTIC ACID, PLASMA: Lactic Acid, Venous: 2.2 mmol/L (ref 0.5–1.9)

## 2020-02-24 MED ORDER — STERILE WATER FOR INJECTION IV SOLN
INTRAVENOUS | Status: DC
  Filled 2020-02-24: qty 850

## 2020-02-24 MED ORDER — INSULIN REGULAR(HUMAN) IN NACL 100-0.9 UT/100ML-% IV SOLN: INTRAVENOUS | Status: DC

## 2020-02-24 MED ORDER — STERILE WATER FOR INJECTION IV SOLN
INTRAVENOUS | Status: AC
  Filled 2020-02-24: qty 450

## 2020-02-24 MED ORDER — FAMOTIDINE 40 MG/5ML PO SUSR
20.0000 mg | Freq: Every day | ORAL | Status: DC
  Administered 2020-02-24 – 2020-02-29 (×6): 20 mg
  Filled 2020-02-24 (×6): qty 2.5

## 2020-02-24 MED ORDER — PIPERACILLIN-TAZOBACTAM 3.375 G IVPB
3.3750 g | Freq: Once | INTRAVENOUS | Status: AC
  Administered 2020-02-24: 3.375 g via INTRAVENOUS
  Filled 2020-02-24: qty 50

## 2020-02-24 MED ORDER — PRISMASOL BGK 4/2.5 32-4-2.5 MEQ/L EC SOLN
Status: DC
  Filled 2020-02-24 (×15): qty 5000

## 2020-02-24 MED ORDER — INSULIN ASPART 100 UNIT/ML ~~LOC~~ SOLN
4.0000 [IU] | SUBCUTANEOUS | Status: DC
  Administered 2020-02-24 – 2020-03-01 (×34): 4 [IU] via SUBCUTANEOUS

## 2020-02-24 MED ORDER — VANCOMYCIN HCL 1500 MG/300ML IV SOLN
1500.0000 mg | Freq: Once | INTRAVENOUS | Status: AC
  Administered 2020-02-24: 1500 mg via INTRAVENOUS
  Filled 2020-02-24: qty 300

## 2020-02-24 MED ORDER — DEXTROSE 50 % IV SOLN: 0.0000 mL | INTRAVENOUS | Status: DC | PRN

## 2020-02-24 MED ORDER — ALTEPLASE 2 MG IJ SOLR
2.0000 mg | Freq: Once | INTRAMUSCULAR | Status: AC
  Administered 2020-02-24: 2 mg
  Filled 2020-02-24: qty 2

## 2020-02-24 MED ORDER — HEPARIN SODIUM (PORCINE) 1000 UNIT/ML DIALYSIS
1000.0000 [IU] | INTRAMUSCULAR | Status: DC | PRN
  Administered 2020-02-25: 2500 [IU] via INTRAVENOUS_CENTRAL
  Filled 2020-02-24 (×3): qty 6

## 2020-02-24 MED ORDER — "THROMBI-PAD 3""X3"" EX PADS"
1.0000 | MEDICATED_PAD | Freq: Once | CUTANEOUS | Status: AC
  Administered 2020-02-24: 1 via TOPICAL
  Filled 2020-02-24: qty 1

## 2020-02-24 MED ORDER — STERILE WATER FOR INJECTION IV SOLN
INTRAVENOUS | Status: DC
  Filled 2020-02-24 (×4): qty 150

## 2020-02-24 MED ORDER — SODIUM CHLORIDE 0.9 % IV SOLN
2.0000 g | Freq: Once | INTRAVENOUS | Status: AC
  Administered 2020-02-24: 2 g via INTRAVENOUS
  Filled 2020-02-24: qty 2

## 2020-02-24 MED ORDER — VANCOMYCIN HCL 1000 MG/200ML IV SOLN
1000.0000 mg | INTRAVENOUS | Status: DC
  Filled 2020-02-24: qty 200

## 2020-02-24 MED ORDER — INSULIN ASPART 100 UNIT/ML ~~LOC~~ SOLN
0.0000 [IU] | SUBCUTANEOUS | Status: DC
  Administered 2020-02-24: 7 [IU] via SUBCUTANEOUS
  Administered 2020-02-24: 4 [IU] via SUBCUTANEOUS
  Administered 2020-02-24: 11 [IU] via SUBCUTANEOUS
  Administered 2020-02-24 – 2020-02-25 (×3): 4 [IU] via SUBCUTANEOUS
  Administered 2020-02-25: 7 [IU] via SUBCUTANEOUS
  Administered 2020-02-25 (×3): 4 [IU] via SUBCUTANEOUS
  Administered 2020-02-26: 3 [IU] via SUBCUTANEOUS
  Administered 2020-02-26: 4 [IU] via SUBCUTANEOUS
  Administered 2020-02-26: 3 [IU] via SUBCUTANEOUS
  Administered 2020-02-26 – 2020-02-27 (×3): 4 [IU] via SUBCUTANEOUS
  Administered 2020-02-27: 3 [IU] via SUBCUTANEOUS
  Administered 2020-02-27 (×2): 4 [IU] via SUBCUTANEOUS
  Administered 2020-02-28: 3 [IU] via SUBCUTANEOUS
  Administered 2020-02-28: 4 [IU] via SUBCUTANEOUS
  Administered 2020-02-28: 3 [IU] via SUBCUTANEOUS
  Administered 2020-02-28: 4 [IU] via SUBCUTANEOUS
  Administered 2020-02-29 (×2): 3 [IU] via SUBCUTANEOUS
  Administered 2020-02-29: 7 [IU] via SUBCUTANEOUS
  Administered 2020-02-29: 11 [IU] via SUBCUTANEOUS
  Administered 2020-02-29 – 2020-03-01 (×2): 4 [IU] via SUBCUTANEOUS

## 2020-02-24 MED ORDER — VANCOMYCIN 50 MG/ML ORAL SOLUTION
125.0000 mg | Freq: Four times a day (QID) | ORAL | Status: DC
  Administered 2020-02-24 (×3): 125 mg
  Filled 2020-02-24 (×6): qty 2.5

## 2020-02-24 MED ORDER — SODIUM BICARBONATE 8.4 % IV SOLN
100.0000 meq | Freq: Once | INTRAVENOUS | Status: AC
  Administered 2020-02-24: 100 meq via INTRAVENOUS
  Filled 2020-02-24: qty 100

## 2020-02-24 MED ORDER — INSULIN DETEMIR 100 UNIT/ML ~~LOC~~ SOLN
20.0000 [IU] | Freq: Two times a day (BID) | SUBCUTANEOUS | Status: DC
  Administered 2020-02-24 – 2020-02-28 (×10): 20 [IU] via SUBCUTANEOUS
  Filled 2020-02-24 (×12): qty 0.2

## 2020-02-24 MED ORDER — PIPERACILLIN-TAZOBACTAM 3.375 G IVPB
3.3750 g | Freq: Four times a day (QID) | INTRAVENOUS | Status: DC
  Administered 2020-02-24 – 2020-02-25 (×3): 3.375 g via INTRAVENOUS
  Filled 2020-02-24 (×3): qty 50

## 2020-02-24 MED ORDER — ORAL CARE MOUTH RINSE
15.0000 mL | OROMUCOSAL | Status: DC
  Administered 2020-02-24 – 2020-03-01 (×67): 15 mL via OROMUCOSAL

## 2020-02-24 MED ORDER — STERILE WATER FOR INJECTION IV SOLN
INTRAVENOUS | Status: DC
  Filled 2020-02-24: qty 150

## 2020-02-24 MED ORDER — STERILE WATER FOR INJECTION IV SOLN
INTRAVENOUS | Status: DC
  Filled 2020-02-24: qty 300

## 2020-02-24 MED ORDER — SODIUM CHLORIDE 0.9 % IV SOLN: 10.0000 mg | Freq: Once | INTRAVENOUS | Status: DC

## 2020-02-24 MED ORDER — STERILE WATER FOR INJECTION IV SOLN
INTRAVENOUS | Status: DC
  Filled 2020-02-24: qty 450

## 2020-02-24 MED ORDER — STERILE WATER FOR INJECTION IV SOLN
INTRAVENOUS | Status: AC
  Filled 2020-02-24: qty 300

## 2020-02-24 MED ORDER — SODIUM ZIRCONIUM CYCLOSILICATE 10 G PO PACK
10.0000 g | PACK | Freq: Once | ORAL | Status: AC
  Administered 2020-02-24: 10 g
  Filled 2020-02-24: qty 1

## 2020-02-24 MED ORDER — VANCOMYCIN HCL 1000 MG/200ML IV SOLN: 1000.0000 mg | INTRAVENOUS | Status: DC

## 2020-02-24 MED ORDER — CHLORHEXIDINE GLUCONATE 0.12% ORAL RINSE (MEDLINE KIT)
15.0000 mL | Freq: Two times a day (BID) | OROMUCOSAL | Status: DC
  Administered 2020-02-24 – 2020-03-01 (×14): 15 mL via OROMUCOSAL

## 2020-02-24 MED ORDER — HEPARIN (PORCINE) 2000 UNITS/L FOR CRRT: INTRAVENOUS_CENTRAL | Status: DC | PRN

## 2020-02-24 MED ORDER — STERILE WATER FOR INJECTION IV SOLN
INTRAVENOUS | Status: DC
  Filled 2020-02-24 (×7): qty 150

## 2020-02-24 NOTE — Progress Notes (Signed)
eLink Physician-Brief Progress Note Patient Name: Rachel Chaney DOB: [birth year] MRN: [id number]   Date of Service  02/24/2020  HPI/Events of Note  WBC = 82.3.  eICU Interventions  Plan: 1. Blood cultures X 2 now.  2. Tracheal aspirate culture now.  3. Vancomycin and Cefepime per pharmacy consult.      Intervention Category Major Interventions: Other:  Lysle Dingwall 02/24/2020, 1:57 AM

## 2020-02-24 NOTE — Progress Notes (Signed)
SLP Cancellation Note  Patient Details Name: Rachel Chaney MRN: [id number] DOB: 10-25-1960   Cancelled treatment:       Reason Eval/Treat Not Completed: Medical issues which prohibited therapy . SLP following for swallowing, but pt now intubated, coded post-intubation. Will f/u for potential to resume swallowing therapy.     Osie Bond., M.A. [place] [facility] Pager [phone] Office [phone]  02/24/2020, 8:43 AM

## 2020-02-24 NOTE — Progress Notes (Signed)
ANTICOAGULATION CONSULT NOTE - Initial Consult  Pharmacy Consult for IV Hepairn Indication: Suspected pulmonary emoblism post thrombolytics  No Known Allergies  Patient Measurements: Height: 5' 4.02" (162.6 cm) Weight: 89.2 kg (196 lb 10.4 oz) IBW/kg (Calculated) : 54.74 Heparin Dosing Weight: 87.7 kg  Vital Signs: Temp: 99.1 F (37.3 C) (02/15 0800) Temp Source: Axillary (02/15 0800) BP: 120/51 (02/15 1000) Pulse Rate: 101 (02/15 1000)  Labs: Recent Labs    02/21/20 1105 02/22/20 1029 02/23/20 0337 02/23/20 1536 02/23/20 2320 02/23/20 2358 02/24/20 0944  HGB  --    < > 9.9*   < > 11.6* 11.6* 9.9*  HCT  --    < > 31.5*   < > 39.8 34.0* 34.5*  PLT  --    < > 273  --  343  --  303  APTT  --    < > 40*  --  96*  --  73*  LABPROT  --   --   --   --  28.6*  --  24.4*  INR  --   --   --   --  2.8*  --  2.3*  HEPARINUNFRC  --   --   --   --   --   --  0.14*  CREATININE  --    < > 1.95*  --  3.33*  --  3.67*  TROPONINIHS 277*  --   --   --   --   --   --    < > = values in this interval not displayed.    Estimated Creatinine Clearance: 17.4 mL/min (A) (by C-G formula based on SCr of 3.67 mg/dL (H)).  Assessment: 60 year old female with suspected pulmonary embolism status post thrombolytics (IV Tenecteplase) on 2/14  Finally able to obtain labs - hep lvl low at 0.14  Cbc stable  Plan to resume crrt later today  Goal of Therapy:  Heparin level 0.3 to 0.5 units/ml for 24 hours (ends ~ 1800) then return to normal goal Monitor platelets by anticoagulation protocol: Yes   Plan:  Increase heparin to 1000 units/hr Repeat hep lvl [place], PharmD, BCPS, BCCCP Clinical Pharmacist [phone]  Please check AMION for all [place] numbers  02/24/2020 10:49 AM

## 2020-02-24 NOTE — Plan of Care (Signed)

## 2020-02-24 NOTE — Progress Notes (Signed)
   02/23/20 1140  Clinical Encounter Type  Visited With Patient not available  Visit Type Code  Referral From Nurse  Consult/Referral To Chaplain   Chaplain responded to Code Blue. Pt unavailable and no family present. No current need for chaplain. Chaplain remains available.   This note was prepared by Chaplain Resident, Dante Gang, MDiv. Chaplain remains available as needed through the on-call pager: [phone].

## 2020-02-24 NOTE — Progress Notes (Signed)
Patient ID: Rachel Chaney, female   DOB: [birth year], 60 y.o.   MRN: [id number] [facility] Progress Note   Assessment/ Plan:   1.  Status post PEA arrest: Secondary to healthcare associated pneumonia/sepsis preceding hospitalization.  Again had an episode of cardiac arrest overnight for which she was reintubated and is back on pressors/sodium bicarbonate drip for profound metabolic acidosis-etiology suspected to be possibly from PE and patient treated with tenecteplase. 2. ESRD: On a Monday/Wednesday/Friday dialysis schedule as an outpatient; she was anticipated to transition to intermittent hemodialysis today via her left upper arm fistula however, with episode of cardiac arrest/hemodynamic instability overnight this will not be possible.  Dr. Tacy Learn is placing a dialysis catheter for CRRT. 3. Anemia: Hemoglobin and hematocrit less likely lower, will continue monitoring with ESA. 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  Ongoing wound care of chronic right hip wound. 5. Nutrition: Continue tube feeds via NGT and supplementations per RD recommendations. 6.  Acute hypoxic respiratory failure: Secondary to combination of Covid pneumonia and volume excess/pulmonary edema and now PE.  Begin CRRT today with UF as tolerable.  Subjective:   Had episode of cardiac arrest overnight and reintubated.  Suspected to have had a massive PE.   Objective:   BP (!) 120/51   Pulse (!) 101   Temp 99.1 F (37.3 C) (Axillary)   Resp (!) 24   Ht 5' 4.02" (1.626 m)   Wt 89.2 kg   LMP 09/18/2011   SpO2 98%   BMI 33.74 kg/m   Physical Exam: Gen: Intubated, sedated, appears comfortable CVS: Pulse regular rhythm, normal rate, S1 and S2 normal Resp: Anteriorly clear to auscultation without rales/rhonchi Abd: Soft, obese, nontender, bowel sounds normal Ext:  Left upper arm AV fistula pulsatile.  With nonpitting lower extremity edema.  Labs: BMET Recent Labs  Lab 02/17/20 1628 02/18/20 0522 02/19/20 4098 02/19/20 0453 02/20/20 0419 02/21/20 0612 02/21/20 0849 02/22/20 1029 02/22/20 1642 02/23/20 0337 02/23/20 1536 02/23/20 2320 02/23/20 2358  NA  --   --  134*   < > 135 136 133* 136 137 135 136 142 137  K  --   --  4.0   < > 3.9 4.7 4.4 4.8 4.5 4.5 5.3* 6.3* 4.9  CL  --   --  96*  --  98 96*  --  101 103 101  --  100  --   CO2  --   --  20*  --  20* 17*  --  20* 20* 24  --  11*  --   GLUCOSE  --   --  405*  --  186* 191*  --  156* 157* 127*  --  150*  --   BUN  --   --  50*  --  75* 95*  --  36* 34* 25*  --  38*  --   CREATININE  --   --  4.56*  --  6.06* 7.24*  --  3.12* 2.85* 1.95*  --  3.33*  --   CALCIUM  --   --  10.0  --  10.6* 10.4*  --  10.2 10.0 9.9  --  11.0*  --   PHOS 4.5 4.8*  --   --   --  8.0*  --  5.0* 5.4* 3.9  --   --   --    < > =  values in this interval not displayed.   CBC Recent Labs  Lab 02/22/20 1030 02/23/20 0337 02/23/20 1536 02/23/20 2320 02/23/20 2358 02/24/20 0944  WBC 28.5* 29.4*  --  82.3*  --  PENDING  HGB 9.6* 9.9* 12.2 11.6* 11.6* 9.9*  HCT 30.3* 31.5* 36.0 39.8 34.0* 34.5*  MCV 101.3* 104.0*  --  112.1*  --  109.5*  PLT 251 273  --  343  --  303     Medications:    . aspirin  81 mg Oral Daily  . atorvastatin  80 mg Oral QHS  . B-complex with vitamin C  1 tablet Oral Daily  . chlorhexidine gluconate (MEDLINE KIT)  15 mL Mouth Rinse BID  . Chlorhexidine Gluconate Cloth  6 each Topical Q0600  . dextrose  1 ampule Intravenous Once  . docusate  100 mg Oral BID  . famotidine  20 mg Per Tube Daily  . feeding supplement (PROSource TF)  45 mL Per Tube BID  . fentaNYL (SUBLIMAZE) injection  50 mcg Intravenous Once  . mouth rinse  15 mL Mouth Rinse 10 times per day  . midodrine  10 mg Oral TID WC  . polyethylene glycol  17 g Oral Daily  . silver sulfADIAZINE   Topical BID   Elmarie Shiley,  MD 02/24/2020, 10:21 AM

## 2020-02-24 NOTE — Hospital Course (Signed)
Rachel Chaney is a 60 y.o. female who initially presented with acute on chronic hypoxic respiratory failure and "skin pain" but experienced subsequent decline due to *** with eventual ICU admission and intubation. PMH is significant for ESRD on HD MWF, chronic hypoxic respiratory failure, T2DM, HFpEF, HTN, h/o MI (2020), HLD.  Admitted 02/18/2020 PEA arrest during dialysis, ROSC, transfer to ICU 02/13/2020  Acute on Chronic Hypoxic Respiratory Failure  Recent COVID-19  HCAP  Cardiac arrest Patient presented for increased pain related to her skin lesions as discussed below. In the ED she was initially stable on room air, but later noted to be hypoxic to 76%. Of note, patient uses 3L O2 at home but did not bring it with her today. She was stabilized on 4L O2 in the ED. Of note, she tested positive for COVID-19 on 01/31/2020 and received remdesevir x2 during prior admission. CXR on admission shows patchy bilateral infiltrates c/w multifocal PNA likely due to recent COVID infection. ABG was obtained in the ED and showed pH 7.345, pO2 64, pCO2 59, bicarb 32.2. Etiology of her hypoxemia likely multifactorial secondary to noncompliance with home O2 and COVID sequela infection. Acute heart failure less likely due to recent ECHO results from 02/01/20, euvolemic appearance on admission, and no recently missed HD sessions.  Very little suspicion for PE as Wells score was 0. At a hemodialysis session on 2/04 patient experienced PEA arrest with subsequent achievement of ROSC, intubation, and transfer to the ICU. *** Blood pressure intermittently required pressors to maintain. Patient weaned off precidex and extubated 2/11. Subsequently mentation improved over several days. Palliative consult note from 2/13 indicates husband continues to elect for full code and would like to proceed with watchful waiting. *** Patient was emergently intubated 2/14 with cardiac arrest one hour later. ROSC was achieved at this time as  well, but had absent pupil reactivity. 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 She has been seen multiple times by PCP and in the ED for her symptoms. 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 Multiple providers have also felt there is a component of calciphylaxis. No signs of cellulitis or abscess on exam/imaging today. Pt given 0.5 mg IV Dilaudid in the ED.  She has trialed Triamcinolone and Doxepin cream as well as hydroxyzine for itching and Tramadol 50mg  q12h prn for pain.  -Tylenol 650mg  q6h scheduled -Tramadol 50mg  q12h prn -Continue Doxepin cream   Hypotension Pt given 0.5 mg IV Diluadid in the ED for pain. Shortly after returning for CT her BP dropped 80s/40s and became acutely confused. She lost her IV and ED provider placed a left EJ. She received a 500 cc bolus. BP on admission 110/52.  - monitor BP  - Avoid Dilaudid at this time.    Leukocytosis WBC 34.6 on admission. She does have a left shift, with increased monocytes and immature granulocytes. Patient with a hx of persistent leukocytosis, last seen by hematology in 2016. She did receive vancomycin in the ED. Given patient's history, this is less likely infectious-- patient is afebrile, lactic acid 1.6, skin wounds not consistent with cellulitis (no warmth or surrounding erythema, no abscess), no source identified on CT abdomen/pelvis and no bacterial PNA on CXR. Discontinue vancomycin. Will monitor for fever and follow up on blood cultures drawn in the ED.  -am CBC -f/u blood cx results -Discontinue Vancomycin  -Outpatient heme-onc  f/u   ESRD on HD MWF  Anemia of chronic disease Chronic, stable. On admission: Cr 8.76, anion gap 16, chloride 90, electrolytes otherwise wnl. Hgb 11.3, which is her baseline. Last HD yesterday, Wednesday 2/1.  -Will consult nephro for HD in AM -Daily  renal function panel/CBC -Renal dosing of meds as appropriate   T2DM Glucose 110 on arrival. Was previously on insulin, but this was discontinued during her most recent admission (02/01/2020) due to hypoglycemia. Reports CBG 45 at home 2 days ago. Last A1c on 11/12/2019 was 9.0.  -CBG monitoring qAC and qHS -Recheck hemoglobin A1c -Outpatient PCP f/u   HLD Chronic, stable. Most recent lipid panel from 11/12/2019 total cholesterol 79, HDL 31, LDL 35, triglycerides 65. -Continue home atorvastatin 80mg  daily  Discharge recommendations:

## 2020-02-24 NOTE — Progress Notes (Signed)
[place] for IV Hepairn Indication: Suspected pulmonary emoblism post-thrombolytics  No Known Allergies  Patient Measurements: Height: 5' 4.02" (162.6 cm) Weight: 89.2 kg (196 lb 10.4 oz) IBW/kg (Calculated) : 54.74 Heparin Dosing Weight: 74.6 kg  Vital Signs: Temp: 98.7 F (37.1 C) (02/15 1935) Temp Source: Axillary (02/15 1935) BP: 142/43 (02/15 1508) Pulse Rate: 90 (02/15 1915)  Labs: Recent Labs    02/23/20 0337 02/23/20 1536 02/23/20 2320 02/23/20 2358 02/24/20 0944 02/24/20 1130 02/24/20 1600 02/24/20 1800  HGB 9.9*   < > 11.6* 11.6* 9.9* 10.2*  --   --   HCT 31.5*   < > 39.8 34.0* 34.5* 30.0*  --   --   PLT 273  --  343  --  303  --   --   --   APTT 40*  --  96*  --  73*  --   --   --   LABPROT  --   --  28.6*  --  24.4*  --   --   --   INR  --   --  2.8*  --  2.3*  --   --   --   HEPARINUNFRC  --   --   --   --  0.14*  --   --  0.11*  CREATININE 1.95*  --  3.33*  --  3.67*  --  3.54*  --    < > = values in this interval not displayed.    Estimated Creatinine Clearance: 18 mL/min (A) (by C-G formula based on SCr of 3.54 mg/dL (H)).  Assessment: 60 year old female with suspected pulmonary embolism status post thrombolytics (IV Tenecteplase) on 2/14.  Heparin level remains low tonight at 0.11 after rate increase earlier today. Hx ESRD on HD >> CRRT started today. Cbc stable. No issues with infusion per discussion with RN. He does state continuous oozing from patient's IV site with Thrombi-Pad applied earlier today. RN to notify MD/Pharmacy if bleeding worsens.  Goal of Therapy:  Heparin level 0.3 to 0.7 units/ml - return to normal heparin level goal after completed 24hrs targeting lower goal ~1800 today Monitor platelets by anticoagulation protocol: Yes   Plan:  Increase heparin IV 1200 units/hr Check 8hr heparin level Monitor daily CBC, s/sx bleeding   Arturo Morton, PharmD, BCPS Please check AMION for all [place] contact numbers Clinical Pharmacist 02/24/2020 8:30 PM

## 2020-02-24 NOTE — Plan of Care (Signed)
  Problem: Education: Goal: Knowledge of General Education information will improve Description Including pain rating scale, medication(s)/side effects and non-pharmacologic comfort measures Outcome: Progressing   Problem: Health Behavior/Discharge Planning: Goal: Ability to manage health-related needs will improve Outcome: Progressing   

## 2020-02-24 NOTE — Progress Notes (Signed)
Inpatient Diabetes Program Recommendations  AACE/ADA: New Consensus Statement on Inpatient Glycemic Control (2015)  Target Ranges:  Prepandial:   less than 140 mg/dL      Peak postprandial:   less than 180 mg/dL (1-2 hours)      Critically ill patients:  140 - 180 mg/dL   Lab Results  Component Value Date   GLUCAP 303 (H) 02/24/2020   HGBA1C 5.5 02/12/2020    Review of Glycemic Control  Diabetes history: DM2 Outpatient Diabetes medications: None listed Current orders for Inpatient glycemic control: Novolog 0-6 units Q4H  Nepro at 50/H. Hypo of 34 mg/dL on 2/14  Inpatient Diabetes Program Recommendations:     Add Novolog 0-6 units Q4H Add Novolog 2 units Q4H for TF coverage.  Hold if TF is held for any reason.  Follow closely.  Thank you. Lorenda Peck, RD, LDN, CDE Inpatient Diabetes Coordinator [phone]

## 2020-02-24 NOTE — Procedures (Signed)
Central Venous Catheter Insertion Procedure Note  MAHLIA FERNANDO  [id number]  [birth year]  Date:02/24/20  Time:11:11 AM   Provider Performing:Errin Whitelaw   Procedure: Insertion of Non-tunneled Central Venous Catheter(36556)with US guidance (73428)    Indication(s) Medication administration and Hemodialysis  Consent Risks of the procedure as well as the alternatives and risks of each were explained to the patient and/or caregiver.  Consent for the procedure was obtained and is signed in the bedside chart  Anesthesia Topical only with 1% lidocaine   Timeout Verified patient identification, verified procedure, site/side was marked, verified correct patient position, special equipment/implants available, medications/allergies/relevant history reviewed, required imaging and test results available.  Sterile Technique Maximal sterile technique including full sterile barrier drape, hand hygiene, sterile gown, sterile gloves, mask, hair covering, sterile ultrasound probe cover (if used).  Procedure Description Area of catheter insertion was cleaned with chlorhexidine and draped in sterile fashion.   With real-time ultrasound guidance a HD catheter was placed into the right subclavian vein.  Nonpulsatile blood flow and easy flushing noted in all ports.  The catheter was sutured in place and sterile dressing applied.  Complications/Tolerance None; patient tolerated the procedure well. Chest X-ray is ordered to verify placement for internal jugular or subclavian cannulation.  Chest x-ray is not ordered for femoral cannulation.  EBL Minimal  Specimen(s) None

## 2020-02-24 NOTE — Progress Notes (Signed)
Rachel Chaney, MRN:  [id number], DOB:  1960-09-09, LOS: 44 ADMISSION DATE:  02/16/2020, CONSULTATION DATE:  02/10/2020 REFERRING MD:  FMTS, CHIEF COMPLAINT:  PEA arrest    Brief History:  Rachel Chaney is a 61 y.o. female with a pPMH of ESRD on HD (MWF), Chronic hypoxic respiratory failure, T2DM, HFpEF, HTN, HLD, CAD s/p MI (2020) who presented to [facility] with chronic nonspecific skin pain over her bilateral lower extremities and her hospitalization has been complicated by acute on chronic hypoxic respiratory failure and hypotension.   Patient had PEA cardiac arrest during dialysis on 2/4, she was transferred to ICU. She was covid positive prior to this admit on 01/31/20  Past Medical History:  ESRD  Type II diabetes  HLD HTN Cardiac arrest  CHF  Significant Hospital Events:  02/02 - Admitted for leg pain found hypoxic and hypotensive.  02/04 - PEA arrest xxxxxxx 2./11 - Precedex weaned off this morning Remains on insulin drip but likely can be transitioned off Norepinephrine 3. 0.50, currently on pressure support ventilation. Scheduled for hemodialysis later today  2/12 - 2/12 - remains extubated  since yesterday.  Overnight  Needing pressors.  Ongoping challenges with measuring BP -> being measured RLE. On [place] -o2 -followgn some commands. Deconditioned and weak. Difffuse pain per RN  Consults:  Nephrology  Cardiology  Procedures:  ETT 2/4> 2/11 ETT 2/14 > Right subclavian HD 2/15 > Left IJ CVC 2/15 > Right axillary art line 2/15 >  Significant Diagnostic Tests:  01/23 Echo > 60 to 65%  02/02 CXR > Patchy BILATERAL pulmonary infiltrates favor multifocal pneumonia over pulmonary edema.  02/02 CT Abdomen/Pelvis > No acute abnormality. Multiple chronic findings as detailed above, not substantially changed from recent prior study.  02/02 CT left and right femur > No acute osseous abnormality. There is nonspecific lower extremity edema without evidence for an abscess.  Findings are nonspecific but can be seen in patients with cellulitis in the appropriate clinical setting. Other differential considerations include chronic venous insufficiency given the presence of subcutaneous calcifications.  Micro Data:  01/31/20 - covid positive xxxx MRSA PCR 2/4 > neg Blood culture 2/4 > neg Respiratory 2/4 > neg Respiratory culture 2/15 > negative C. difficile 2/15 >>  Antimicrobials:  Zosyn 2/4 > 2/11-- 2/15 > Vancomycin 2/2 > 2/7-- 2/15 > P.o. vancomycin 2/15  >>  Interim History / Subjective:  Yesterday and afternoon patient became unresponsive, not hypoglycemic, hypotensive and hypoxic.  Bedside echocardiogram was done which shows RV dilatation and hypokinesis, it was new compared to the echocardiogram which was done 3 weeks ago.  With sudden change in hemodynamics, decision was made to give TNKase and then patient was started on heparin infusion  Objective   Blood pressure (!) 120/51, pulse (!) 101, temperature 99.1 F (37.3 C), temperature source Axillary, resp. rate (!) 24, height 5' 4.02" (1.626 m), weight 89.2 kg, last menstrual period 09/18/2011, SpO2 98 %.    Vent Mode: PRVC FiO2 (%):  [65 %-100 %] 65 % Set Rate:  [15 bmp-22 bmp] 22 bmp Vt Set:  [330 mL] 330 mL PEEP:  [5 cmH20-10 cmH20] 10 cmH20 Plateau Pressure:  [9 cmH20-14 cmH20] 9 cmH20   Intake/Output Summary (Last 24 hours) at 02/24/2020 1114 Last data filed at 02/24/2020 1000 Gross per 24 hour  Intake 2964.73 ml  Output 200 ml  Net 2764.73 ml   Filed Weights   02/22/20 0500 02/23/20 0436 02/24/20 0359  Weight: 90.4 kg 87.7 kg  89.2 kg      General Appearance: Middle-age obese African-American female, lying on the bed, orally intubated. Head:  Normocephalic, without obvious abnormality, atraumatic Eyes:  PERRL - yes, conjunctiva/corneas - muddy     Ears:  Normal external ear canals, both ears Neck:  Supple,  No enlargement/tenderness/nodules Lungs: Clear to auscultation  bilaterally, Distant breath sound Heart:  S1 and S2 normal, no murmur Abdomen:  Soft, no masses, no organomegaly Skin: Big subcutaneous bruise in lower abdomen and right inner thigh Neurologic: Eyes closed, not following commands, moving all 4 extremities.   Resolved Hospital Problem list    HCAP ./sp Zosyn ending 2/11 -  7 day course Acute metabolic encephalopathy 05/12/5463 and off Precedex  Assessment & Plan:  COVID-19 - 01/31/20 covid IgG  - reactive 02/21/20 Completed therapy with remdesivir and steroid  Presumed massive PE with right heart strain Patient had sudden change in hemodynamics yesterday, bedside POCUS showed large RV, compressing LV Patient was given TNKase, followed by she was started on heparin infusion We'll get CT angiogram of chest at some point  Probable severe C. difficile colitis Patient's white count has trended up to 80k She has watery diarrhea, greenish in color We'll send C. difficile stool toxin and she will be started on p.o. vancomycin Continue contact precautions  Septic shock Probably due to C. difficile versus HCAP Patient was started on vancomycin and Zosyn, respiratory cultures were sent C. difficile stool toxin is pending Continue IV Levophed with map goal 65  Mixed metabolic and respiratory acidosis Patient was on bicarbonate infusion, it was stopped as she will be started on CRRT Vent settings were adjusted to clear hypercapnia  Status post PEA Arrest x2 presumably due to Septic Shock (present on admission) Continue telemetry monitoring  Acute Hypoxic/hypercapnic respiratory Failure Patient was extubated on 2/11, due to change in her mental status and hypoxia she was reintubated on 2/14 ABG suggestive of hypoxia and hypercapnia Vent settings were adjusted, continue to monitor and trend ABGs  ESRD on HD MWF New HD catheter was placed today Patient will be started on CRRT Appreciate nephrology input  Anemia of chronic and critical  illness H&H is stable Transfuse if less than 7  T2DM, poorly controlled with hyperglycemia Continue sliding scale insulin CBG goal of 140-180  Best practice (evaluated daily)  Diet: NPO/tube feeds Pain/Anxiety/Delirium protocol (if indicated): none VAP protocol (if indicated): In place  DVT prophylaxis: Heparin SQ GI prophylaxis: h2 blockade Glucose control: SSI Mobility: Bedrest Disposition:ICU Family communication: Patient's husband was updated at bedside Code Status: Full  Goals of care discussion: 2/15.  Patient's husband was updated regarding current condition of patient and poor prognosis considering she had multiple cardiac arrest during this admission with prior multiple medical problems He would like to continue aggressive care and keeping her full code. LABS    PULMONARY Recent Labs  Lab 02/21/20 0849 02/23/20 1536 02/23/20 2358  PHART 7.284* 7.116* 7.009*  PCO2ART 41.2 28.0* 55.3*  PO2ART 66* 81* 225*  HCO3 19.4* 9.1* 13.9*  TCO2 21* 10* 16*  O2SAT 89.0 92.0 99.0    CBC Recent Labs  Lab 02/23/20 0337 02/23/20 1536 02/23/20 2320 02/23/20 2358 02/24/20 0944  HGB 9.9*   < > 11.6* 11.6* 9.9*  HCT 31.5*   < > 39.8 34.0* 34.5*  WBC 29.4*  --  82.3*  --  62.9*  PLT 273  --  343  --  303   < > = values in this interval not displayed.  COAGULATION Recent Labs  Lab 02/23/20 2320 02/24/20 0944  INR 2.8* 2.3*    CARDIAC  No results for input(s): TROPONINI in the last 168 hours. No results for input(s): PROBNP in the last 168 hours.   CHEMISTRY Recent Labs  Lab 02/20/20 0419 02/21/20 0612 02/21/20 0849 02/22/20 1029 02/22/20 1642 02/23/20 0337 02/23/20 1536 02/23/20 2320 02/23/20 2358 02/24/20 0944  NA 135 136   < > 136 137 135 136 142 137 140  K 3.9 4.7   < > 4.8 4.5 4.5 5.3* 6.3* 4.9 4.2  CL 98 96*  --  101 103 101  --  100  --  96*  CO2 20* 17*  --  20* 20* 24  --  11*  --  28  GLUCOSE 186* 191*  --  156* 157* 127*  --  150*  --   342*  BUN 75* 95*  --  36* 34* 25*  --  38*  --  47*  CREATININE 6.06* 7.24*  --  3.12* 2.85* 1.95*  --  3.33*  --  3.67*  CALCIUM 10.6* 10.4*  --  10.2 10.0 9.9  --  11.0*  --  9.7  MG 2.4 2.6*  --  2.4  --  2.6*  --   --   --  2.6*  PHOS  --  8.0*  --  5.0* 5.4* 3.9  --   --   --  6.9*   < > = values in this interval not displayed.   Estimated Creatinine Clearance: 17.4 mL/min (A) (by C-G formula based on SCr of 3.67 mg/dL (H)).   LIVER Recent Labs  Lab 02/20/20 0419 02/22/20 1029 02/22/20 1642 02/23/20 0337 02/23/20 2320 02/24/20 0944  AST 33 23  --   --   --   --   ALT 29 27  --   --   --   --   ALKPHOS 129* 102  --   --   --   --   BILITOT 1.0 1.0  --   --   --   --   PROT 6.7 6.3*  --   --   --   --   ALBUMIN 1.9* 2.0* 2.0* 2.1*  --  2.0*  INR  --   --   --   --  2.8* 2.3*     INFECTIOUS Recent Labs  Lab 02/21/20 1105 02/22/20 1030 02/23/20 1634  LATICACIDVEN 1.7 1.5 >11.0*     ENDOCRINE CBG (last 3)  Recent Labs    02/23/20 2320 02/24/20 0324 02/24/20 0718  GLUCAP 162* 287* 303*    Total critical care time: 48 minutes  Performed by: Jacky Kindle   Critical care time was exclusive of separately billable procedures and treating other patients.   Critical care was necessary to treat or prevent imminent or life-threatening deterioration.   Critical care was time spent personally by me on the following activities: development of treatment plan with patient and/or surrogate as well as nursing, discussions with consultants, evaluation of patient's response to treatment, examination of patient, obtaining history from patient or surrogate, ordering and performing treatments and interventions, ordering and review of laboratory studies, ordering and review of radiographic studies, pulse oximetry and re-evaluation of patient's condition.   Jacky Kindle MD [facility] See Amion for pager If no response to pager, please call [phone] until  7pm After 7pm, Please call E-link [phone]

## 2020-02-24 NOTE — Progress Notes (Signed)
PT Cancellation Note  Patient Details Name: Rachel Chaney MRN: [id number] DOB: [birth year]   Cancelled Treatment:    Reason Eval/Treat Not Completed: Patient not medically ready (pt intubated and arrested yesterday with FiO2 65%)   Saketh Daubert B [name] 02/24/2020, 6:44 AM  [name], PT Acute Rehabilitation Services Pager: [phone] Office: [phone]

## 2020-02-24 NOTE — Progress Notes (Signed)
[place] Progress Note Patient Name: Rachel Chaney DOB: 06-29-1960 MRN: [id number]   Date of Service  02/24/2020  HPI/Events of Note  K+ = 6.3 with slight hemolysis.   eICU Interventions  Plan: 1. Lokelma 10 gm per tube now.  2. Repeat BMP at 8 AM.     Intervention Category Major Interventions: Electrolyte abnormality - evaluation and management  Aiyden Lauderback Eugene 02/24/2020, 1:18 AM

## 2020-02-24 NOTE — Procedures (Signed)
Arterial Catheter Insertion Procedure Note  Rachel Chaney  [id number]  1960-09-12  Date:02/24/20  Time:11:13 AM    Provider Performing: Jacky Kindle    Procedure: Insertion of Arterial Line [phone]) with US guidance ([id number])   Indication(s) Blood pressure monitoring and/or need for frequent ABGs  Consent Risks of the procedure as well as the alternatives and risks of each were explained to the patient and/or caregiver.  Consent for the procedure was obtained and is signed in the bedside chart  Anesthesia None   Time Out Verified patient identification, verified procedure, site/side was marked, verified correct patient position, special equipment/implants available, medications/allergies/relevant history reviewed, required imaging and test results available.   Sterile Technique Maximal sterile technique including full sterile barrier drape, hand hygiene, sterile gown, sterile gloves, mask, hair covering, sterile ultrasound probe cover (if used).   Procedure Description Area of catheter insertion was cleaned with chlorhexidine and draped in sterile fashion. With real-time ultrasound guidance an arterial catheter was placed into the right Axillary artery.  Appropriate arterial tracings confirmed on monitor.     Complications/Tolerance None; patient tolerated the procedure well.   EBL Minimal   Specimen(s) None

## 2020-02-24 NOTE — Procedures (Signed)
Central Venous Catheter Insertion Procedure Note  Rachel Chaney  [id number]  Jul 08, 1960  Date:02/24/20  Time:11:12 AM   Provider Performing:Lamoyne Palencia   Procedure: Insertion of Non-tunneled Central Venous [phone]) with US guidance ([id number])   Indication(s) Medication administration  Consent Risks of the procedure as well as the alternatives and risks of each were explained to the patient and/or caregiver.  Consent for the procedure was obtained and is signed in the bedside chart  Anesthesia Topical only with 1% lidocaine   Timeout Verified patient identification, verified procedure, site/side was marked, verified correct patient position, special equipment/implants available, medications/allergies/relevant history reviewed, required imaging and test results available.  Sterile Technique Maximal sterile technique including full sterile barrier drape, hand hygiene, sterile gown, sterile gloves, mask, hair covering, sterile ultrasound probe cover (if used).  Procedure Description Area of catheter insertion was cleaned with chlorhexidine and draped in sterile fashion.  With real-time ultrasound guidance a central venous catheter was placed into the left internal jugular vein. Nonpulsatile blood flow and easy flushing noted in all ports.  The catheter was sutured in place and sterile dressing applied.  Complications/Tolerance None; patient tolerated the procedure well. Chest X-ray is ordered to verify placement for internal jugular or subclavian cannulation.   Chest x-ray is not ordered for femoral cannulation.  EBL Minimal  Specimen(s) None

## 2020-02-24 NOTE — Progress Notes (Signed)
Pharmacy Antibiotic Note  Rachel Chaney is a 60 y.o. female admitted on 02/24/2020 with respiratory failure.  Pharmacy has been consulted for Vancomycin/Cefepime dosing. Marked leukocytosis with AM (82.3). Pt is s/p cardiac arrest today with ROSC. She got tenecteplase yesterday for presumed PE.    Pt has ESRD on HD PTA, she was previously on CRRT, but plans were to try to get her back to [facility] That will be unlikely today now that pt is s/p arrest tonight.   Plan: Vancomycin 1500 mg IV x 1 Cefepime 2g IV x 1  F/U nephrology plans (CRRT vs iHD) later today to decide further dosing  Height: 5' 4.02" (162.6 cm) Weight: 87.7 kg (193 lb 5.5 oz) IBW/kg (Calculated) : 54.74  Temp (24hrs), Avg:99.3 F (37.4 C), Min:98.9 F (37.2 C), Max:99.6 F (37.6 C)  Recent Labs  Lab 02/20/20 0419 02/21/20 0612 02/21/20 1105 02/22/20 1029 02/22/20 1030 02/22/20 1642 02/23/20 0337 02/23/20 1634 02/23/20 2320  WBC 53.5* 55.7*  --   --  28.5*  --  29.4*  --  82.3*  CREATININE 6.06* 7.24*  --  3.12*  --  2.85* 1.95*  --  3.33*  LATICACIDVEN  --   --  1.7  --  1.5  --   --  >11.0*  --     Estimated Creatinine Clearance: 19 mL/min (A) (by C-G formula based on SCr of 3.33 mg/dL (H)).    No Known Allergies  Narda Bonds, PharmD, BCPS Clinical Pharmacist Phone: [phone]

## 2020-02-24 NOTE — Progress Notes (Signed)
[name] Progress Note Patient Name: Rachel Chaney DOB: 01/27/1960 MRN: [id number]   Date of Service  02/24/2020  HPI/Events of Note  ABG on 100%/PRVC 15/TV 330/P 10 = 7.009/55.3/225. PRVC rate increased to 22.   eICU Interventions  Plan: 1. NaHCO3 100 meq IV now.  2. Increase NaHCO3 IV infusion to 125 mL/hour.  3. Repeat ABG at 2:30 PM.      Intervention Category Major Interventions: Acid-Base disturbance - evaluation and management;Respiratory failure - evaluation and management  Lysle Dingwall 02/24/2020, 12:34 AM

## 2020-02-25 ENCOUNTER — Inpatient Hospital Stay (HOSPITAL_COMMUNITY): Payer: Medicare Other

## 2020-02-25 DIAGNOSIS — J9622 Acute and chronic respiratory failure with hypercapnia: Secondary | ICD-10-CM | POA: Diagnosis not present

## 2020-02-25 DIAGNOSIS — R0602 Shortness of breath: Secondary | ICD-10-CM | POA: Diagnosis not present

## 2020-02-25 DIAGNOSIS — J189 Pneumonia, unspecified organism: Secondary | ICD-10-CM

## 2020-02-25 DIAGNOSIS — U071 COVID-19: Secondary | ICD-10-CM

## 2020-02-25 DIAGNOSIS — J9621 Acute and chronic respiratory failure with hypoxia: Secondary | ICD-10-CM

## 2020-02-25 LAB — RENAL FUNCTION PANEL
Albumin: 1.7 g/dL — ABNORMAL LOW (ref 3.5–5.0)
Albumin: 1.7 g/dL — ABNORMAL LOW (ref 3.5–5.0)
Anion gap: 13 (ref 5–15)
Anion gap: 15 (ref 5–15)
BUN: 46 mg/dL — ABNORMAL HIGH (ref 8–23)
BUN: 55 mg/dL — ABNORMAL HIGH (ref 8–23)
CO2: 31 mmol/L (ref 22–32)
CO2: 32 mmol/L (ref 22–32)
Calcium: 8.7 mg/dL — ABNORMAL LOW (ref 8.9–10.3)
Calcium: 8.9 mg/dL (ref 8.9–10.3)
Chloride: 93 mmol/L — ABNORMAL LOW (ref 98–111)
Chloride: 93 mmol/L — ABNORMAL LOW (ref 98–111)
Creatinine, Ser: 3.37 mg/dL — ABNORMAL HIGH (ref 0.44–1.00)
Creatinine, Ser: 4 mg/dL — ABNORMAL HIGH (ref 0.44–1.00)
GFR, Estimated: 15 mL/min — ABNORMAL LOW (ref >60)
GFR, Estimated: 12 mL/min — ABNORMAL LOW (ref >60)
Glucose, Bld: 219 mg/dL — ABNORMAL HIGH (ref 70–99)
Glucose, Bld: 201 mg/dL — ABNORMAL HIGH (ref 70–99)
Phosphorus: 4.8 mg/dL — ABNORMAL HIGH (ref 2.5–4.6)
Phosphorus: 5.2 mg/dL — ABNORMAL HIGH (ref 2.5–4.6)
Potassium: 3.4 mmol/L — ABNORMAL LOW (ref 3.5–5.1)
Potassium: 3 mmol/L — ABNORMAL LOW (ref 3.5–5.1)
Sodium: 137 mmol/L (ref 135–145)
Sodium: 140 mmol/L (ref 135–145)

## 2020-02-25 LAB — CBC
HCT: 27.6 % — ABNORMAL LOW (ref 36.0–46.0)
Hemoglobin: 8.4 g/dL — ABNORMAL LOW (ref 12.0–15.0)
MCH: 31.8 pg (ref 26.0–34.0)
MCHC: 30.4 g/dL (ref 30.0–36.0)
MCV: 104.5 fL — ABNORMAL HIGH (ref 80.0–100.0)
Platelets: 230 10*3/uL (ref 150–400)
RBC: 2.64 MIL/uL — ABNORMAL LOW (ref 3.87–5.11)
RDW: 19.8 % — ABNORMAL HIGH (ref 11.5–15.5)
WBC: 54 10*3/uL (ref 4.0–10.5)
nRBC: 3.9 % — ABNORMAL HIGH (ref 0.0–0.2)

## 2020-02-25 LAB — BLOOD GAS, ARTERIAL
Acid-Base Excess: 10.3 mmol/L — ABNORMAL HIGH (ref 0.0–2.0)
Bicarbonate: 34.5 mmol/L — ABNORMAL HIGH (ref 20.0–28.0)
FIO2: 50
O2 Saturation: 99.4 %
Patient temperature: 36.5
pCO2 arterial: 46.4 mmHg (ref 32.0–48.0)
pH, Arterial: 7.482 — ABNORMAL HIGH (ref 7.350–7.450)
pO2, Arterial: 140 mmHg — ABNORMAL HIGH (ref 83.0–108.0)

## 2020-02-25 LAB — GLUCOSE, CAPILLARY
Glucose-Capillary: 151 mg/dL — ABNORMAL HIGH (ref 70–99)
Glucose-Capillary: 153 mg/dL — ABNORMAL HIGH (ref 70–99)
Glucose-Capillary: 174 mg/dL — ABNORMAL HIGH (ref 70–99)
Glucose-Capillary: 190 mg/dL — ABNORMAL HIGH (ref 70–99)
Glucose-Capillary: 190 mg/dL — ABNORMAL HIGH (ref 70–99)
Glucose-Capillary: 205 mg/dL — ABNORMAL HIGH (ref 70–99)

## 2020-02-25 LAB — PATHOLOGIST SMEAR REVIEW

## 2020-02-25 LAB — MAGNESIUM: Magnesium: 2.2 mg/dL (ref 1.7–2.4)

## 2020-02-25 LAB — HEPARIN LEVEL (UNFRACTIONATED): Heparin Unfractionated: 0.41 IU/mL (ref 0.30–0.70)

## 2020-02-25 MED ORDER — MIDODRINE HCL 5 MG PO TABS
10.0000 mg | ORAL_TABLET | Freq: Three times a day (TID) | ORAL | Status: DC
  Administered 2020-02-25 – 2020-03-01 (×15): 10 mg
  Filled 2020-02-25 (×15): qty 2

## 2020-02-25 MED ORDER — ATORVASTATIN CALCIUM 80 MG PO TABS
80.0000 mg | ORAL_TABLET | Freq: Every day | ORAL | Status: DC
  Administered 2020-02-25 – 2020-02-29 (×5): 80 mg
  Filled 2020-02-25 (×5): qty 1

## 2020-02-25 MED ORDER — B COMPLEX-C PO TABS
1.0000 | ORAL_TABLET | Freq: Every day | ORAL | Status: DC
  Administered 2020-02-26 – 2020-03-01 (×5): 1
  Filled 2020-02-25 (×5): qty 1

## 2020-02-25 MED ORDER — PIPERACILLIN-TAZOBACTAM 3.375 G IVPB
3.3750 g | Freq: Two times a day (BID) | INTRAVENOUS | Status: DC
Start: 2020-02-25 — End: 2020-03-01
  Administered 2020-02-25 – 2020-03-01 (×10): 3.375 g via INTRAVENOUS
  Filled 2020-02-25 (×10): qty 50

## 2020-02-25 MED ORDER — PIPERACILLIN-TAZOBACTAM 3.375 G IVPB 30 MIN
3.3750 g | Freq: Four times a day (QID) | INTRAVENOUS | Status: DC
  Filled 2020-02-25: qty 50

## 2020-02-25 MED ORDER — NOREPINEPHRINE 4 MG/250ML-% IV SOLN
0.0000 ug/min | INTRAVENOUS | Status: DC
  Administered 2020-02-25: 5 ug/min via INTRAVENOUS
  Administered 2020-02-26: 7 ug/min via INTRAVENOUS
  Administered 2020-02-26: 5 ug/min via INTRAVENOUS
  Administered 2020-02-27: 1 ug/min via INTRAVENOUS
  Filled 2020-02-25 (×5): qty 250

## 2020-02-25 MED ORDER — "THROMBI-PAD 3""X3"" EX PADS"
1.0000 | MEDICATED_PAD | Freq: Once | CUTANEOUS | Status: AC
  Administered 2020-02-25: 1 via TOPICAL
  Filled 2020-02-25: qty 1

## 2020-02-25 MED ORDER — VANCOMYCIN HCL 1000 MG/200ML IV SOLN: 1000.0000 mg | INTRAVENOUS | Status: DC

## 2020-02-25 NOTE — Progress Notes (Signed)
eLink Physician-Brief Progress Note Patient Name: Rachel Chaney DOB: 18-Apr-1960 MRN: [id number]   Date of Service  02/25/2020  HPI/Events of Note  L IJ CVL site oozing.   eICU Interventions  Plan: 1. Apply Thrombi-pad to oozing L IJ CVL site.      Intervention Category Major Interventions: Other:  Lysle Dingwall 02/25/2020, 6:19 AM

## 2020-02-25 NOTE — Progress Notes (Signed)
Lower extremity venous bilateral study completed.   Please see CV Proc for preliminary results.   Bridgit Eynon, RDMS  

## 2020-02-25 NOTE — Progress Notes (Signed)
Rachel Chaney for IV Hepairn Indication: Suspected pulmonary emoblism post-thrombolytics  No Known Allergies  Patient Measurements: Height: 5\' 4"  (162.6 cm) Weight: 90.3 kg (199 lb 1.2 oz) IBW/kg (Calculated) : 54.7 Heparin Dosing Weight: 74.6 kg  Vital Signs: Temp: 97.6 F (36.4 C) (02/16 0400) Temp Source: Axillary (02/16 0400) Pulse Rate: 82 (02/16 0700)  Labs: Recent Labs    02/23/20 0337 02/23/20 1536 02/23/20 2320 02/23/20 2358 02/24/20 0944 02/24/20 1130 02/24/20 1600 02/24/20 1800 02/25/20 0340  HGB 9.9*   < > 11.6*   < > 9.9* 10.2*  --   --  8.4*  HCT 31.5*   < > 39.8   < > 34.5* 30.0*  --   --  27.6*  PLT 273  --  343  --  303  --   --   --  230  APTT 40*  --  96*  --  73*  --   --   --   --   LABPROT  --   --  28.6*  --  24.4*  --   --   --   --   INR  --   --  2.8*  --  2.3*  --   --   --   --   HEPARINUNFRC  --   --   --   --  0.14*  --   --  0.11* 0.41  CREATININE 1.95*  --  3.33*  --  3.67*  --  3.54*  --  3.37*   < > = values in this interval not displayed.    Estimated Creatinine Clearance: 19.1 mL/min (A) (by C-G formula based on SCr of 3.37 mg/dL (H)).  Assessment: 60 year old female with suspected pulmonary embolism status post thrombolytics (IV Tenecteplase) on 2/14.  Hep lvl within goal Some oozing from CVC site - thrombi pad applied  Goal of Therapy:  Heparin level 0.3 to 0.7 units/ml  Monitor platelets by anticoagulation protocol: Yes   Plan:  Continue heparin 1200 units/hr Daily hep lvl cbc  Barth Kirks, PharmD, BCPS, BCCCP Clinical Pharmacist [phone]  Please check AMION for all [place] numbers  02/25/2020 7:25 AM

## 2020-02-25 NOTE — Plan of Care (Signed)
  Problem: Clinical Measurements: Goal: Respiratory complications will improve Outcome: Progressing Goal: Cardiovascular complication will be avoided Outcome: Progressing   

## 2020-02-25 NOTE — Progress Notes (Signed)
Patient ID: Early Chars, female   DOB: 1960-07-05, 60 y.o.   MRN: [id number] [facility] Progress Note   Assessment/ Plan:   1.  Status post PEA arrest: Secondary to healthcare associated pneumonia/sepsis preceding hospitalization.  She unfortunately had an episode of cardiac arrest 2 days ago requiring reintubation-etiology suspected to be possibly from PE and patient treated with tenecteplase. 2. ESRD: On a Monday/Wednesday/Friday dialysis schedule as an outpatient.  Overnight, on CRRT for correction of profound metabolic acidosis which has been accomplished-CRRT had to be stopped due to catheter dysfunction and at this time, she does not have any compelling indications for RRT.  The plan is to reevaluate her labs/clinical status tomorrow morning to decide on safety of IHD via LUA AVF. 3. Anemia: Hemoglobin and hematocrit less likely lower, will continue monitoring with ESA. 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  Ongoing wound care of chronic right hip wound. 5. Nutrition: Continue tube feeds via NGT and supplementations per RD recommendations. 6.  Acute hypoxic respiratory failure: Secondary to combination of Covid pneumonia and volume excess/pulmonary edema and now PE.  Status post CRRT yesterday/part of the night.  Subjective:   With hemodialysis catheter dysfunction overnight-CRRT discontinued overnight.   Objective:   BP (!) 146/44   Pulse 86   Temp (!) 97.4 F (36.3 C) (Axillary)   Resp (!) 30   Ht '5\' 4"'  (1.626 m)   Wt 90.3 kg   LMP 09/18/2011   SpO2 100%   BMI 34.17 kg/m   Physical Exam: Gen: Intubated, sedated, husband at bedside CVS: Pulse regular rhythm, normal rate, S1 and S2 normal Resp: Anteriorly clear to auscultation without rales/rhonchi Abd: Soft, obese, nontender, bowel sounds normal Ext: Left upper arm  AV fistula pulsatile.  With nonpitting lower extremity edema.  Labs: BMET Recent Labs  Lab 02/21/20 0612 02/21/20 0849 02/22/20 1029 02/22/20 1642 02/23/20 0337 02/23/20 1536 02/23/20 2320 02/23/20 2358 02/24/20 0944 02/24/20 1130 02/24/20 1600 02/25/20 0340  NA 136   < > '136 137 135 136 142 137 140 140 140 137 '  K 4.7   < > 4.8 4.5 4.5 5.3* 6.3* 4.9 4.2 4.0 3.6 3.4*  CL 96*  --  101 103 101  --  100  --  96*  --  94* 93*  CO2 17*  --  20* 20* 24  --  11*  --  28  --  32 31  GLUCOSE 191*  --  156* 157* 127*  --  150*  --  342*  --  223* 219*  BUN 95*  --  36* 34* 25*  --  38*  --  47*  --  48* 46*  CREATININE 7.24*  --  3.12* 2.85* 1.95*  --  3.33*  --  3.67*  --  3.54* 3.37*  CALCIUM 10.4*  --  10.2 10.0 9.9  --  11.0*  --  9.7  --  9.0 8.7*  PHOS 8.0*  --  5.0* 5.4* 3.9  --   --   --  6.9*  --  5.6* 4.8*   < > = values in this interval not displayed.   CBC Recent Labs  Lab 02/23/20 0337 02/23/20 1536 02/23/20 2320 02/23/20 2358 02/24/20 0944 02/24/20 1130 02/25/20 0340  WBC 29.4*  --  82.3*  --  62.9*  --  54.0*  HGB 9.9*   < >  11.6* 11.6* 9.9* 10.2* 8.4*  HCT 31.5*   < > 39.8 34.0* 34.5* 30.0* 27.6*  MCV 104.0*  --  112.1*  --  109.5*  --  104.5*  PLT 273  --  343  --  303  --  230   < > = values in this interval not displayed.     Medications:    . aspirin  81 mg Oral Daily  . atorvastatin  80 mg Oral QHS  . B-complex with vitamin C  1 tablet Oral Daily  . chlorhexidine gluconate (MEDLINE KIT)  15 mL Mouth Rinse BID  . Chlorhexidine Gluconate Cloth  6 each Topical Q0600  . dextrose  1 ampule Intravenous Once  . docusate  100 mg Oral BID  . famotidine  20 mg Per Tube Daily  . feeding supplement (PROSource TF)  45 mL Per Tube BID  . fentaNYL (SUBLIMAZE) injection  50 mcg Intravenous Once  . insulin aspart  0-20 Units Subcutaneous Q4H  . insulin aspart  4 Units Subcutaneous Q4H  . insulin detemir  20 Units Subcutaneous BID  . mouth rinse  15 mL Mouth  Rinse 10 times per day  . midodrine  10 mg Oral TID WC  . polyethylene glycol  17 g Oral Daily  . silver sulfADIAZINE   Topical BID   Elmarie Shiley, MD 02/25/2020, 9:45 AM

## 2020-02-25 NOTE — Progress Notes (Signed)
Pharmacy Antibiotic Note  Rachel Chaney is a 60 y.o. female admitted on 02/21/2020 with respiratory failure.    Was supposed to resume CRRT but issues with catheter overnight Now plans to hold further RRT and re-assess in am tomorrow  Height: 5\' 4"  (162.6 cm) Weight: 90.3 kg (199 lb 1.2 oz) IBW/kg (Calculated) : 54.7  Temp (24hrs), Avg:97.9 F (36.6 C), Min:97.4 F (36.3 C), Max:98.7 F (37.1 C)  Recent Labs  Lab 02/21/20 1105 02/22/20 1029 02/22/20 1030 02/22/20 1642 02/23/20 0337 02/23/20 1634 02/23/20 2320 02/24/20 0944 02/24/20 1129 02/24/20 1600 02/25/20 0340  WBC  --   --  28.5*  --  29.4*  --  82.3* 62.9*  --   --  54.0*  CREATININE  --    < >  --    < > 1.95*  --  3.33* 3.67*  --  3.54* 3.37*  LATICACIDVEN 1.7  --  1.5  --   --  >11.0*  --   --  2.2*  --   --    < > = values in this interval not displayed.    Estimated Creatinine Clearance: 19.1 mL/min (A) (by C-G formula based on SCr of 3.37 mg/dL (H)).    No Known Allergies  Plan: Hold vanc Zosyn 3.375 gm Iv q12h vanc lvl in am F/u plans for iHD vs CRRT Thu  Barth Kirks, PharmD, BCPS, BCCCP Clinical Pharmacist [phone]  Please check AMION for all [place] numbers  02/25/2020 10:35 AM

## 2020-02-25 NOTE — Progress Notes (Signed)
SLP Cancellation Note  Patient Details Name: DREW HERMAN MRN: [id number] DOB: 22-Mar-1960   Cancelled treatment:       Reason Eval/Treat Not Completed: Medical issues which prohibited therapy. Will sign off at this time. Please reorder as needed   Mayu Ronk, Katherene Ponto 02/25/2020, 7:43 AM

## 2020-02-25 NOTE — Progress Notes (Signed)
Rachel Chaney, MRN:  [id number], DOB:  [birth year], LOS: 3 ADMISSION DATE:  02/12/2020, CONSULTATION DATE:  02/10/2020 REFERRING MD:  FMTS, CHIEF COMPLAINT:  PEA arrest    Brief History:  Rachel Chaney is a 60 y.o. female with a pPMH of ESRD on HD (MWF), Chronic hypoxic respiratory failure, T2DM, HFpEF, HTN, HLD, CAD s/p MI (2020) who presented to [facility] with chronic nonspecific skin pain over her bilateral lower extremities and her hospitalization has been complicated by acute on chronic hypoxic respiratory failure and hypotension.   Patient had PEA cardiac arrest during dialysis on 2/4, she was transferred to ICU. She was covid positive prior to this admit on 01/31/20  Past Medical History:  ESRD  Type II diabetes  HLD HTN Cardiac arrest  CHF  Significant Hospital Events:  02/02 - Admitted for leg pain found hypoxic and hypotensive.  02/04 - PEA arrest xxxxxxx 2/11 - Precedex weaned off this morning Remains on insulin drip but likely can be transitioned levo 3. 0.50, currently on pressure support ventilation. Scheduled for hemodialysis later today 2/12 - remains extubated  since yesterday.  Overnight  Needing pressors.  Ongoping challenges with measuring BP -> being measured RLE. On [place] -o2 -following some commands. Deconditioned and weak. Difffuse pain per RN 2/14: Patient became unresponsive, not hypoglycemic, hypotensive and hypoxic. Reintubated due to change in her mental status and hypoxia. Bedside echocardiogram was done and showed RV dilatation and hypokinesis, new compared to the echocardiogram which was done 3 weeks ago.  With sudden change in hemodynamics, decision was made to give [facility] and then patient was started on heparin infusion   Consults:  Nephrology  Cardiology  Procedures:  ETT 2/4> 2/11 ETT 2/14 > Right subclavian HD 2/15 > Left IJ CVC 2/15 > Right axillary art line 2/15 >  Significant Diagnostic Tests:  01/23 Echo > 60 to 65%  02/02 CXR > Patchy  BILATERAL pulmonary infiltrates favor multifocal pneumonia over pulmonary edema.  02/02 CT Abdomen/Pelvis > No acute abnormality. Multiple chronic findings as detailed above, not substantially changed from recent prior study.  02/02 CT left and right femur > No acute osseous abnormality. There is nonspecific lower extremity edema without evidence for an abscess. Findings are nonspecific but can be seen in patients with cellulitis in the appropriate clinical setting. Other differential considerations include chronic venous insufficiency given the presence of subcutaneous calcifications.  Micro Data:  01/31/20 - covid positive xxxx MRSA PCR 2/4 > neg Blood culture 2/4 > neg Respiratory 2/4 > neg Respiratory culture 2/15 > negative C. difficile 2/15 >> negative  Antimicrobials:  Zosyn 2/4 > 2/11-- 2/15 > Vancomycin 2/2 > 2/7-- 2/15 > P.o. vancomycin 2/15  > 2/16  Interim History / Subjective:  2/16: Overnight had some bleeding from R IJ site. Thrombi-pad applied. HD catheter may have clotted, not returning blood.    Objective   Blood pressure (!) 146/44, pulse 83, temperature (!) 97.4 F (36.3 C), temperature source Axillary, resp. rate (!) 30, height 5\' 4"  (1.626 m), weight 90.3 kg, last menstrual period 09/18/2011, SpO2 100 %.    Vent Mode: PRVC FiO2 (%):  [40 %-60 %] 40 % Set Rate:  [30 bmp] 30 bmp Vt Set:  [330 mL] 330 mL PEEP:  [8 cmH20-10 cmH20] 8 cmH20 Plateau Pressure:  [18 cmH20-22 cmH20] 18 cmH20   Intake/Output Summary (Last 24 hours) at 02/25/2020 1056 Last data filed at 02/25/2020 0900 Gross per 24 hour  Intake 2369.93 ml  Output  361 ml  Net 2008.93 ml   Filed Weights   02/24/20 0359 02/24/20 2000 02/25/20 0419  Weight: 89.2 kg 89.2 kg 90.3 kg      General Appearance: Middle-age obese African-American female, lying on the bed, orally intubated. Head:  Normocephalic, without obvious abnormality, atraumatic Eyes:  PERRL     Neck:  Supple,  No  enlargement/tenderness/nodules Lungs: Clear to auscultation bilaterally, Distant breath sounds Heart:  S1 and S2 normal, no murmur Abdomen:  Soft, no masses, no organomegaly Skin: Scattered ecchymoses in lower abdomen, right inner thigh with chronic appearing calciphylaxis.  Neurologic: Eyes closed, not following commands, moving all extremities.   Resolved Hospital Problem list      Assessment & Plan:  [id number] - 01/31/20 covid IgG  - reactive 02/21/20 Completed therapy with remdesivir and steroid  Acute on chronic hypoxic/hypercapnic respiratory failure: Per husband, pt uses BiPAP/CPAP at night (unsure which) and 3L [place] during the day related to OSA and obesity hypoventilation syndrome. She has been treated for HCAP with Zosyn ending 2/11 for a 7 day course. She also had known Covid PNA in January with reactive IgG (2/12) and completed therapy with remdesivir and steroid. This AM ABG improved without hypercapnia after adjustment of vent settings yesterday.  - Continue to monitor and trend ABGs  Presumed massive PE with right heart strain Patient had sudden change in hemodynamics 21/4, bedside POCUS showed large RV, compressing LV. Patient was given TNKase, followed by she was started on heparin infusion. - Echo and LE dopplers to evaluate for thromboembolic disease  Septic shock Likely due to HCAP. C. Diff antigen negative, stool output has not been significant.  White count is improving; 54 today from 80 2 days ago.  Respiratory and blood cultures negative.  - Continue IV Levophed with map goal 65 - Continue Zosyn - Discontinue oral vanc and contact precautions  Acute metabolic encephalopathy: Mental status worsened in setting of hypoxia, suspected massive PE requiring sedation and intubation (2/14). CT head shows no acute or reversible findings.  - Wean sedation, currently on fentanyl gtt - Consider MRI  Mixed metabolic and respiratory acidosis (resolved) Patient was on  bicarbonate infusion, it was stopped as she was started on CRRT Vent settings were adjusted to clear hypercapnia.  Status post PEA Arrest x2 presumably due to Septic Shock (present on admission) Continue telemetry monitoring  ESRD on HD MWF: CRRT stopped overnight due to catheter dysfunction; per nephro, no indication for RRT.  - Reevaluate tomorrow, consider iHD - Appreciate nephrology input  Anemia of chronic and critical illness H&H is stable Transfuse if less than 7  T2DM, poorly controlled with hyperglycemia Continue sliding scale insulin CBG goal of 140-180  Best practice (evaluated daily)  Diet: NPO/tube feeds Pain/Anxiety/Delirium protocol (if indicated): none VAP protocol (if indicated): In place  DVT prophylaxis: Heparin SQ GI prophylaxis: h2 blockade Glucose control: SSI Mobility: Bedrest Disposition:ICU Family communication: Patient's husband was updated at bedside Code Status: Full  Goals of care discussion: 2/16.  Patient's husband was updated regarding current condition of patient and poor prognosis considering she had multiple cardiac arrest during this admission with prior multiple medical problems He would like to continue aggressive care and keeping her full code pending further evaluation. LABS    PULMONARY Recent Labs  Lab 02/21/20 0849 02/23/20 1536 02/23/20 2358 02/24/20 1130 02/25/20 0335  PHART 7.284* 7.116* 7.009* 7.296* 7.482*  PCO2ART 41.2 28.0* 55.3* 75.9* 46.4  PO2ART 66* 81* 225* 84 140*  HCO3 19.4* 9.1*  13.9* 37.0* 34.5*  TCO2 21* 10* 16* 39*  --   O2SAT 89.0 92.0 99.0 94.0 99.4    CBC Recent Labs  Lab 02/23/20 2320 02/23/20 2358 02/24/20 0944 02/24/20 1130 02/25/20 0340  HGB 11.6*   < > 9.9* 10.2* 8.4*  HCT 39.8   < > 34.5* 30.0* 27.6*  WBC 82.3*  --  62.9*  --  54.0*  PLT 343  --  303  --  230   < > = values in this interval not displayed.    COAGULATION Recent Labs  Lab 02/23/20 2320 02/24/20 0944  INR 2.8* 2.3*     CARDIAC  No results for input(s): TROPONINI in the last 168 hours. No results for input(s): PROBNP in the last 168 hours.   CHEMISTRY Recent Labs  Lab 02/21/20 0612 02/21/20 0849 02/22/20 1029 02/22/20 1642 02/23/20 0337 02/23/20 1536 02/23/20 2320 02/23/20 2358 02/24/20 0944 02/24/20 1130 02/24/20 1600 02/25/20 0340  NA 136   < > 136 137 135   < > 142 137 140 140 140 137  K 4.7   < > 4.8 4.5 4.5   < > 6.3* 4.9 4.2 4.0 3.6 3.4*  CL 96*  --  101 103 101  --  100  --  96*  --  94* 93*  CO2 17*  --  20* 20* 24  --  11*  --  28  --  32 31  GLUCOSE 191*  --  156* 157* 127*  --  150*  --  342*  --  223* 219*  BUN 95*  --  36* 34* 25*  --  38*  --  47*  --  48* 46*  CREATININE 7.24*  --  3.12* 2.85* 1.95*  --  3.33*  --  3.67*  --  3.54* 3.37*  CALCIUM 10.4*  --  10.2 10.0 9.9  --  11.0*  --  9.7  --  9.0 8.7*  MG 2.6*  --  2.4  --  2.6*  --   --   --  2.6*  --   --  2.2  PHOS 8.0*  --  5.0* 5.4* 3.9  --   --   --  6.9*  --  5.6* 4.8*   < > = values in this interval not displayed.   Estimated Creatinine Clearance: 19.1 mL/min (A) (by C-G formula based on SCr of 3.37 mg/dL (H)).   LIVER Recent Labs  Lab 02/20/20 0419 02/22/20 1029 02/22/20 1642 02/23/20 0337 02/23/20 2320 02/24/20 0944 02/24/20 1600 02/25/20 0340  AST 33 23  --   --   --   --   --   --   ALT 29 27  --   --   --   --   --   --   ALKPHOS 129* 102  --   --   --   --   --   --   BILITOT 1.0 1.0  --   --   --   --   --   --   PROT 6.7 6.3*  --   --   --   --   --   --   ALBUMIN 1.9* 2.0* 2.0* 2.1*  --  2.0* 1.9* 1.7*  INR  --   --   --   --  2.8* 2.3*  --   --      INFECTIOUS Recent Labs  Lab 02/22/20 1030 02/23/20 1634 02/24/20 1129  LATICACIDVEN  1.5 >11.0* 2.2*     ENDOCRINE CBG (last 3)  Recent Labs    02/24/20 2330 02/25/20 0338 02/25/20 0810  GLUCAP 192* 151* [place], MS4 [facility]

## 2020-02-26 ENCOUNTER — Inpatient Hospital Stay (HOSPITAL_COMMUNITY): Payer: Medicare Other

## 2020-02-26 DIAGNOSIS — J189 Pneumonia, unspecified organism: Secondary | ICD-10-CM | POA: Diagnosis not present

## 2020-02-26 DIAGNOSIS — J9621 Acute and chronic respiratory failure with hypoxia: Secondary | ICD-10-CM | POA: Diagnosis not present

## 2020-02-26 DIAGNOSIS — I469 Cardiac arrest, cause unspecified: Secondary | ICD-10-CM | POA: Diagnosis not present

## 2020-02-26 DIAGNOSIS — J9622 Acute and chronic respiratory failure with hypercapnia: Secondary | ICD-10-CM | POA: Diagnosis not present

## 2020-02-26 LAB — RENAL FUNCTION PANEL
Albumin: 1.7 g/dL — ABNORMAL LOW (ref 3.5–5.0)
Albumin: 1.6 g/dL — ABNORMAL LOW (ref 3.5–5.0)
Anion gap: 17 — ABNORMAL HIGH (ref 5–15)
Anion gap: 13 (ref 5–15)
BUN: 61 mg/dL — ABNORMAL HIGH (ref 8–23)
BUN: 33 mg/dL — ABNORMAL HIGH (ref 8–23)
CO2: 29 mmol/L (ref 22–32)
CO2: 30 mmol/L (ref 22–32)
Calcium: 8.8 mg/dL — ABNORMAL LOW (ref 8.9–10.3)
Calcium: 8.5 mg/dL — ABNORMAL LOW (ref 8.9–10.3)
Chloride: 94 mmol/L — ABNORMAL LOW (ref 98–111)
Chloride: 96 mmol/L — ABNORMAL LOW (ref 98–111)
Creatinine, Ser: 4.72 mg/dL — ABNORMAL HIGH (ref 0.44–1.00)
Creatinine, Ser: 3.16 mg/dL — ABNORMAL HIGH (ref 0.44–1.00)
GFR, Estimated: 10 mL/min — ABNORMAL LOW (ref >60)
GFR, Estimated: 16 mL/min — ABNORMAL LOW (ref >60)
Glucose, Bld: 138 mg/dL — ABNORMAL HIGH (ref 70–99)
Glucose, Bld: 182 mg/dL — ABNORMAL HIGH (ref 70–99)
Phosphorus: 4.9 mg/dL — ABNORMAL HIGH (ref 2.5–4.6)
Phosphorus: 4 mg/dL (ref 2.5–4.6)
Potassium: 2.9 mmol/L — ABNORMAL LOW (ref 3.5–5.1)
Potassium: 3.1 mmol/L — ABNORMAL LOW (ref 3.5–5.1)
Sodium: 140 mmol/L (ref 135–145)
Sodium: 139 mmol/L (ref 135–145)

## 2020-02-26 LAB — CBC
HCT: 23.7 % — ABNORMAL LOW (ref 36.0–46.0)
Hemoglobin: 7.1 g/dL — ABNORMAL LOW (ref 12.0–15.0)
MCH: 32.4 pg (ref 26.0–34.0)
MCHC: 30 g/dL (ref 30.0–36.0)
MCV: 108.2 fL — ABNORMAL HIGH (ref 80.0–100.0)
Platelets: 205 10*3/uL (ref 150–400)
RBC: 2.19 MIL/uL — ABNORMAL LOW (ref 3.87–5.11)
RDW: 19.9 % — ABNORMAL HIGH (ref 11.5–15.5)
WBC: 57.6 10*3/uL (ref 4.0–10.5)
nRBC: 3.6 % — ABNORMAL HIGH (ref 0.0–0.2)

## 2020-02-26 LAB — CULTURE, RESPIRATORY W GRAM STAIN: Special Requests: NORMAL

## 2020-02-26 LAB — ECHOCARDIOGRAM LIMITED
AR max vel: 1.17 cm2
AV Area VTI: 1.93 cm2
AV Area mean vel: 1.27 cm2
AV Mean grad: 14.5 mmHg
AV Peak grad: 31.4 mmHg
Ao pk vel: 2.8 m/s
Area-P 1/2: 2.01 cm2
Height: 64 in
S' Lateral: 1.9 cm
Weight: 3298.08 oz

## 2020-02-26 LAB — GLUCOSE, CAPILLARY
Glucose-Capillary: 137 mg/dL — ABNORMAL HIGH (ref 70–99)
Glucose-Capillary: 134 mg/dL — ABNORMAL HIGH (ref 70–99)
Glucose-Capillary: 199 mg/dL — ABNORMAL HIGH (ref 70–99)
Glucose-Capillary: 152 mg/dL — ABNORMAL HIGH (ref 70–99)
Glucose-Capillary: 155 mg/dL — ABNORMAL HIGH (ref 70–99)

## 2020-02-26 LAB — VANCOMYCIN, RANDOM: Vancomycin Rm: 28

## 2020-02-26 LAB — HEPARIN LEVEL (UNFRACTIONATED): Heparin Unfractionated: 0.29 IU/mL — ABNORMAL LOW (ref 0.30–0.70)

## 2020-02-26 LAB — MAGNESIUM: Magnesium: 2.3 mg/dL (ref 1.7–2.4)

## 2020-02-26 MED ORDER — LIDOCAINE HCL 1 % IJ SOLN
5.0000 mL | Freq: Once | INTRAMUSCULAR | Status: DC
  Filled 2020-02-26: qty 5

## 2020-02-26 MED ORDER — DOCUSATE SODIUM 50 MG/5ML PO LIQD
100.0000 mg | Freq: Two times a day (BID) | ORAL | Status: DC
  Administered 2020-02-26 – 2020-02-29 (×7): 100 mg
  Filled 2020-02-26 (×7): qty 10

## 2020-02-26 MED ORDER — LIDOCAINE HCL 1 % IJ SOLN
15.0000 mL | Freq: Once | INTRAMUSCULAR | Status: AC
  Administered 2020-02-26: 5 mL
  Filled 2020-02-26: qty 15

## 2020-02-26 MED ORDER — POLYETHYLENE GLYCOL 3350 17 G PO PACK: 17.0000 g | PACK | Freq: Every day | ORAL | Status: DC

## 2020-02-26 MED ORDER — "THROMBI-PAD 3""X3"" EX PADS"
1.0000 | MEDICATED_PAD | Freq: Once | CUTANEOUS | Status: AC
  Administered 2020-02-26: 1 via TOPICAL
  Filled 2020-02-26: qty 1

## 2020-02-26 MED ORDER — POTASSIUM CHLORIDE 20 MEQ PO PACK
20.0000 meq | PACK | Freq: Once | ORAL | Status: AC
  Administered 2020-02-26: 20 meq
  Filled 2020-02-26: qty 1

## 2020-02-26 MED ORDER — ASPIRIN 81 MG PO CHEW
81.0000 mg | CHEWABLE_TABLET | Freq: Every day | ORAL | Status: DC
  Administered 2020-02-27 – 2020-03-01 (×4): 81 mg
  Filled 2020-02-26 (×4): qty 1

## 2020-02-26 MED ORDER — LIDOCAINE HCL (PF) 1 % IJ SOLN
INTRAMUSCULAR | Status: AC
  Filled 2020-02-26: qty 5

## 2020-02-26 NOTE — Progress Notes (Signed)
  Echocardiogram 2D Echocardiogram has been performed.  Jennette Dubin 02/26/2020, 8:44 AM

## 2020-02-26 NOTE — Progress Notes (Signed)
[place] for IV Hepairn Indication: Suspected pulmonary emoblism post-thrombolytics  No Known Allergies  Patient Measurements: Height: 5\' 4"  (162.6 cm) Weight: 93.5 kg (206 lb 2.1 oz) IBW/kg (Calculated) : 54.7 Heparin Dosing Weight: 74.6 kg  Vital Signs: Temp: 97.6 F (36.4 C) (02/17 0800) Temp Source: Axillary (02/17 0800) BP: 155/51 (02/17 0753) Pulse Rate: 87 (02/17 0900)  Labs: Recent Labs    02/23/20 2320 02/23/20 2358 02/24/20 0944 02/24/20 1130 02/24/20 1600 02/24/20 1800 02/25/20 0340 02/25/20 1654 02/26/20 0533  HGB 11.6*   < > 9.9* 10.2*  --   --  8.4*  --  7.1*  HCT 39.8   < > 34.5* 30.0*  --   --  27.6*  --  23.7*  PLT 343  --  303  --   --   --  230  --  205  APTT 96*  --  73*  --   --   --   --   --   --   LABPROT 28.6*  --  24.4*  --   --   --   --   --   --   INR 2.8*  --  2.3*  --   --   --   --   --   --   HEPARINUNFRC  --    < > 0.14*  --   --  0.11* 0.41  --  0.29*  CREATININE 3.33*  --  3.67*  --    < >  --  3.37* 4.00* 4.72*   < > = values in this interval not displayed.    Estimated Creatinine Clearance: 13.9 mL/min (A) (by C-G formula based on SCr of 4.72 mg/dL (H)).  Assessment: 60 year old female with suspected pulmonary embolism status post thrombolytics (IV Tenecteplase) on 2/14.  Hep lvl slightly low Some oozing from CVC site - thrombi pad applied - likely reason for hgb drop  Goal of Therapy:  Heparin level 0.3 to 0.7 units/ml  Monitor platelets by anticoagulation protocol: Yes   Plan:  Increase heparin 1300 units/hr Recheck this evening F/u if bleeding worsens Daily hep lvl cbc  Barth Kirks, PharmD, BCPS, BCCCP Clinical Pharmacist [phone]  Please check AMION for all [place] numbers  02/26/2020 10:01 AM

## 2020-02-26 NOTE — Progress Notes (Signed)
Rachel Chaney, MRN:  [id number], DOB:  1960/06/19, LOS: 68 ADMISSION DATE:  03/05/2020, CONSULTATION DATE:  03/04/2020 REFERRING MD:  FMTS, CHIEF COMPLAINT:  PEA arrest    Brief History:  Rachel Chaney is a 60 y.o. female with a pPMH of ESRD on HD (MWF), Chronic hypoxic respiratory failure, T2DM, HFpEF, HTN, HLD, CAD s/p MI (2020) who presented to [facility] with chronic nonspecific skin pain over her bilateral lower extremities and her hospitalization has been complicated by acute on chronic hypoxic respiratory failure and hypotension.   Patient had PEA cardiac arrest during dialysis on 2/4, she was transferred to ICU. She was covid positive prior to this admit on 01/31/20  Past Medical History:  ESRD  Type II diabetes  HLD HTN Cardiac arrest  CHF  Significant Hospital Events:  02/02 - Admitted for leg pain found hypoxic and hypotensive.  02/04 - PEA arrest xxxxxxx 2/11 - Precedex weaned off this morning Remains on insulin drip but likely can be transitioned levo 3. 0.50, currently on pressure support ventilation. Scheduled for hemodialysis later today 2/12 - remains extubated  since yesterday.  Overnight  Needing pressors.  Ongoping challenges with measuring BP -> being measured RLE. On [place] -o2 -following some commands. Deconditioned and weak. Difffuse pain per RN 2/14: Patient became unresponsive, not hypoglycemic, hypotensive and hypoxic. Reintubated due to change in her mental status and hypoxia. Bedside echocardiogram was done and showed RV dilatation and hypokinesis, new compared to the echocardiogram which was done 3 weeks ago.  With sudden change in hemodynamics, decision was made to give [facility] and then patient was started on heparin infusion   Consults:  Nephrology  Cardiology  Procedures:  ETT 2/4> 2/11 ETT 2/14 > Right subclavian HD 2/15 > Left IJ CVC 2/15 > Right axillary art line 2/15 >  Significant Diagnostic Tests:  01/23 Echo > 60 to 65%  02/02 CXR > Patchy  BILATERAL pulmonary infiltrates favor multifocal pneumonia over pulmonary edema.  02/02 CT Abdomen/Pelvis > No acute abnormality. Multiple chronic findings as detailed above, not substantially changed from recent prior study.  02/02 CT left and right femur > No acute osseous abnormality. There is nonspecific lower extremity edema without evidence for an abscess. Findings are nonspecific but can be seen in patients with cellulitis in the appropriate clinical setting. Other differential considerations include chronic venous insufficiency given the presence of subcutaneous calcifications.  2/16 BL PVL > No evidence of deep vein thrombosis in bilateral lower extremities, though study is limited by body habitus, bandages, and poor ultrasound/tissue interface due to skin changes.   2/17 Echo > Read pending  Micro Data:  01/31/20 - covid positive xxxx MRSA PCR 2/4 > neg Blood culture 2/4 > neg Respiratory 2/4 > neg Respiratory culture 2/15 > negative C. difficile 2/15 >> negative  Antimicrobials:  Zosyn 2/4 > 2/11-- 2/15 > Vancomycin 2/2 > 2/7-- 2/15 > P.o. vancomycin 2/15  > 2/16  Interim History / Subjective:  2/16: Overnight had some bleeding from left IJ. Remains ventilated on pressors (levo 5 mcg/min).  Objective   Blood pressure (!) 155/51, pulse 89, temperature 98.5 F (36.9 C), temperature source Oral, resp. rate (!) 35, height 5\' 4"  (1.626 m), weight 93.5 kg, last menstrual period 09/18/2011, SpO2 93 %.    Vent Mode: PRVC FiO2 (%):  [30 %-40 %] 30 % Set Rate:  [30 bmp] 30 bmp Vt Set:  [330 mL] 330 mL PEEP:  [8 cmH20] 8 cmH20 Plateau Pressure:  [18  cmH20-20 cmH20] 18 cmH20   Intake/Output Summary (Last 24 hours) at 02/26/2020 0906 Last data filed at 02/26/2020 0700 Gross per 24 hour  Intake 1762.18 ml  Output 400 ml  Net 1362.18 ml   Filed Weights   02/24/20 2000 02/25/20 0419 02/26/20 0500  Weight: 89.2 kg 90.3 kg 93.5 kg     General Appearance: Middle-age obese  African-American female, lying on the bed, orally intubated. Head:  Normocephalic, without obvious abnormality, atraumatic Eyes:  Pupils equal, round, sluggishly reactive bilaterally     Neck:  Supple,  No enlargement/tenderness/nodules Lungs: Clear to auscultation bilaterally, Distant breath sounds Heart:  S1 and S2 normal, no murmur Abdomen:  Soft, no masses, no organomegaly Skin: Scattered ecchymoses in lower abdomen, right inner thigh with chronic appearing calciphylaxis.  Neurologic: Eyes open to voice though not tracking, not following commands, moving all extremities.   Resolved Hospital Problem list      Assessment & Plan:  ESRD on HD MWF: BUN and Cr bumped now that she is off CRRT. Will attempt HD today. - HD for clearance with limited UF - Appreciate nephrology input  Acute on chronic hypoxic/hypercapnic respiratory failure: Pt uses CPAP at night and 3L [place] during the day related to OSA/OHS. She has been treated for HCAP with Zosyn ending 2/11 for a 7 day course. She also had known Covid PNA in January with reactive IgG (2/12) and completed therapy with remdesivir and steroid. This AM ABG improved without hypercapnia after adjustment of vent settings yesterday.  - Continue to monitor and trend ABGs - Consider CXR  Presumed massive PE with right heart strain Patient had sudden change in hemodynamics 21/4, bedside POCUS showed large RV, compressing LV. Patient was given TNKase, followed by she was started on heparin infusion. - LE dopplers negative for DVT bilaterally though limited due to body habitus and skin changes - F/u echo  Septic shock Likely due to HCAP. C. Diff antigen negative, stool output has not been significant.  White count today increased from 54 yesterday to 57 today. Respiratory and blood cultures negative.  - Continue IV Levophed with map goal 65 - Continue Zosyn - Discontinue oral vanc and contact precautions  Acute metabolic encephalopathy: Mental  status worsened in setting of hypoxia, suspected massive PE requiring sedation and intubation (2/14). CT head shows no acute or reversible findings.  - Wean sedation, currently on fentanyl gtt - Consider MRI if not waking up despite no sedation  Mixed metabolic and respiratory acidosis (resolved) Patient was on bicarbonate infusion, it was stopped as she was started on CRRT Vent settings were adjusted to clear hypercapnia.  Status post PEA Arrest x2 presumably due to Septic Shock (present on admission) Continue telemetry monitoring  Anemia of chronic and critical illness Hgb dropped to 7.1 this AM likely due to bleeding around catheter site.  - Transfuse if less than 7 - Hold heparin in setting of bleed for 6hr  T2DM, poorly controlled with hyperglycemia Continue sliding scale insulin CBG goal of 140-180  Best practice (evaluated daily)  Diet: NPO/tube feeds Pain/Anxiety/Delirium protocol (if indicated): none VAP protocol (if indicated): In place  DVT prophylaxis: Heparin SQ GI prophylaxis: h2 blockade Glucose control: SSI Mobility: Bedrest Disposition:ICU Family communication: Patient's husband was updated at bedside Code Status: Full  Goals of care discussion: 2/17.  Patient's husband was updated regarding current condition of patient and poor prognosis considering she had multiple cardiac arrest during this admission with prior multiple medical problems. Explained that patient's ability  to tolerate dialysis plays a big role in determining prognosis. For now he would like to continue aggressive care and keeping her full code pending further evaluation. LABS    PULMONARY Recent Labs  Lab 02/21/20 0849 02/23/20 1536 02/23/20 2358 02/24/20 1130 02/25/20 0335  PHART 7.284* 7.116* 7.009* 7.296* 7.482*  PCO2ART 41.2 28.0* 55.3* 75.9* 46.4  PO2ART 66* 81* 225* 84 140*  HCO3 19.4* 9.1* 13.9* 37.0* 34.5*  TCO2 21* 10* 16* 39*  --   O2SAT 89.0 92.0 99.0 94.0 99.4     CBC Recent Labs  Lab 02/24/20 0944 02/24/20 1130 02/25/20 0340 02/26/20 0533  HGB 9.9* 10.2* 8.4* 7.1*  HCT 34.5* 30.0* 27.6* 23.7*  WBC 62.9*  --  54.0* 57.6*  PLT 303  --  230 205    COAGULATION Recent Labs  Lab 02/23/20 2320 02/24/20 0944  INR 2.8* 2.3*    CARDIAC  No results for input(s): TROPONINI in the last 168 hours. No results for input(s): PROBNP in the last 168 hours.   CHEMISTRY Recent Labs  Lab 02/22/20 1029 02/22/20 1642 02/23/20 0337 02/23/20 1536 02/24/20 0944 02/24/20 1130 02/24/20 1600 02/25/20 0340 02/25/20 1654 02/26/20 0533  NA 136   < > 135   < > 140 140 140 137 140 140  K 4.8   < > 4.5   < > 4.2 4.0 3.6 3.4* 3.0* 2.9*  CL 101   < > 101   < > 96*  --  94* 93* 93* 94*  CO2 20*   < > 24   < > 28  --  32 31 32 29  GLUCOSE 156*   < > 127*   < > 342*  --  223* 219* 201* 138*  BUN 36*   < > 25*   < > 47*  --  48* 46* 55* 61*  CREATININE 3.12*   < > 1.95*   < > 3.67*  --  3.54* 3.37* 4.00* 4.72*  CALCIUM 10.2   < > 9.9   < > 9.7  --  9.0 8.7* 8.9 8.8*  MG 2.4  --  2.6*  --  2.6*  --   --  2.2  --  2.3  PHOS 5.0*   < > 3.9  --  6.9*  --  5.6* 4.8* 5.2* 4.9*   < > = values in this interval not displayed.   Estimated Creatinine Clearance: 13.9 mL/min (A) (by C-G formula based on SCr of 4.72 mg/dL (H)).   LIVER Recent Labs  Lab 02/20/20 0419 02/22/20 1029 02/22/20 1642 02/23/20 2320 02/24/20 0944 02/24/20 1600 02/25/20 0340 02/25/20 1654 02/26/20 0533  AST 33 23  --   --   --   --   --   --   --   ALT 29 27  --   --   --   --   --   --   --   ALKPHOS 129* 102  --   --   --   --   --   --   --   BILITOT 1.0 1.0  --   --   --   --   --   --   --   PROT 6.7 6.3*  --   --   --   --   --   --   --   ALBUMIN 1.9* 2.0*   < >  --  2.0* 1.9* 1.7* 1.7* 1.7*  INR  --   --   --  2.8* 2.3*  --   --   --   --    < > = values in this interval not displayed.     INFECTIOUS Recent Labs  Lab 02/22/20 1030 02/23/20 1634 02/24/20 1129   LATICACIDVEN 1.5 >11.0* 2.2*     ENDOCRINE CBG (last 3)  Recent Labs    02/25/20 1939 02/25/20 2331 02/26/20 [place], [place] [facility]

## 2020-02-26 NOTE — Progress Notes (Signed)
Pharmacy Antibiotic Note  Rachel Chaney is a 60 y.o. female admitted on 03/03/2020 with respiratory failure.    Attempt of iHD today  VR 28 this am  Height: 5\' 4"  (162.6 cm) Weight: 93.5 kg (206 lb 2.1 oz) IBW/kg (Calculated) : 54.7  Temp (24hrs), Avg:98.1 F (36.7 C), Min:97.6 F (36.4 C), Max:98.6 F (37 C)  Recent Labs  Lab 02/21/20 1105 02/22/20 1029 02/22/20 1030 02/22/20 1642 02/23/20 0337 02/23/20 1634 02/23/20 2320 02/24/20 0944 02/24/20 1129 02/24/20 1600 02/25/20 0340 02/25/20 1654 02/26/20 0533  WBC  --   --  28.5*  --  29.4*  --  82.3* 62.9*  --   --  54.0*  --  57.6*  CREATININE  --    < >  --    < > 1.95*  --  3.33* 3.67*  --  3.54* 3.37* 4.00* 4.72*  LATICACIDVEN 1.7  --  1.5  --   --  >11.0*  --   --  2.2*  --   --   --   --   VANCORANDOM  --   --   --   --   --   --   --   --   --   --   --   --  28   < > = values in this interval not displayed.    Estimated Creatinine Clearance: 13.9 mL/min (A) (by C-G formula based on SCr of 4.72 mg/dL (H)).    No Known Allergies  Plan: Hold vanc Zosyn 3.375 gm Iv q12h vanc lvl in am Will see if tolerated iHD - likely recheck lvl sat am  Barth Kirks, PharmD, BCPS, BCCCP Clinical Pharmacist [phone]  Please check AMION for all [place] numbers  02/26/2020 10:00 AM

## 2020-02-26 NOTE — Progress Notes (Signed)
[place] for IV Hepairn Indication: Suspected pulmonary emoblism post-thrombolytics  No Known Allergies  Patient Measurements: Height: 5\' 4"  (162.6 cm) Weight: 93.5 kg (206 lb 2.1 oz) IBW/kg (Calculated) : 54.7 Heparin Dosing Weight: 74.6 kg  Vital Signs: Temp: 98.1 F (36.7 C) (02/17 1515) Temp Source: Axillary (02/17 1515) BP: 148/50 (02/17 1635) Pulse Rate: 94 (02/17 1635)  Labs: Recent Labs    02/23/20 2320 02/23/20 2358 02/24/20 0944 02/24/20 1130 02/24/20 1600 02/24/20 1800 02/25/20 0340 02/25/20 1654 02/26/20 0533  HGB 11.6*   < > 9.9* 10.2*  --   --  8.4*  --  7.1*  HCT 39.8   < > 34.5* 30.0*  --   --  27.6*  --  23.7*  PLT 343  --  303  --   --   --  230  --  205  APTT 96*  --  73*  --   --   --   --   --   --   LABPROT 28.6*  --  24.4*  --   --   --   --   --   --   INR 2.8*  --  2.3*  --   --   --   --   --   --   HEPARINUNFRC  --    < > 0.14*  --   --  0.11* 0.41  --  0.29*  CREATININE 3.33*  --  3.67*  --    < >  --  3.37* 4.00* 4.72*   < > = values in this interval not displayed.    Estimated Creatinine Clearance: 13.9 mL/min (A) (by C-G formula based on SCr of 4.72 mg/dL (H)).  Assessment: 60 year old female with suspected pulmonary embolism status post thrombolytics (IV Tenecteplase) on 2/14.  Some oozing from CVC site ongoing 2/17 - thrombi pad applied and suture placed this afternoon. Heparin to be held x 6 hrs and then reassess per CCM. Hg down to 7.1 today, plt wnl.  PM update - per discussion with Dr. Halford Chessman and RN, heparin to be resume now. CVL site oozing has resolved per RN.  Goal of Therapy:  Heparin level 0.3 to 0.7 units/ml  Monitor platelets by anticoagulation protocol: Yes   Plan:  Resume heparin now at previous rate 1300 units/hr per Dr. Halford Chessman Recheck heparin level 8hrs from resumption Monitor daily CBC, s/sx bleeding   Arturo Morton, PharmD, BCPS Please check AMION for all [place]  contact numbers Clinical Pharmacist 02/26/2020 6:29 PM

## 2020-02-26 NOTE — Progress Notes (Signed)
Patient ID: Early Chars, female   DOB: 1960/03/24, 60 y.o.   MRN: [id number] [facility] Progress Note   Assessment/ Plan:   1.  Status post PEA arrest: Secondary to healthcare associated pneumonia/sepsis preceding hospitalization.  She unfortunately had an another episode of cardiac arrest 3 days ago requiring reintubation-etiology suspected to be possibly from PE and patient treated with tenecteplase. 2. ESRD: On a Monday/Wednesday/Friday dialysis schedule as an outpatient.  I will attempt hemodialysis via her left UA AVF primarily for clearance with limited ultrafiltration while on low-dose pressor. 3. Anemia: Hemoglobin and hematocrit continues to drift down without any overt blood loss, will continue monitoring with ESA and transfuse with PRBCs when <7.0. 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  Ongoing wound care of chronic right hip wound. 5. Nutrition: Continue tube feeds via NGT and supplementations per RD recommendations. 6.  Acute hypoxic respiratory failure: Secondary to combination of Covid pneumonia and volume excess/pulmonary edema and now PE.  Will undertake hemodialysis today.  Subjective:   With hemodialysis catheter dysfunction overnight-CRRT discontinued overnight.   Objective:   BP (!) 155/51   Pulse 89   Temp 97.6 F (36.4 C) (Axillary)   Resp (!) 35   Ht '5\' 4"'  (1.626 m)   Wt 93.5 kg   LMP 09/18/2011   SpO2 93%   BMI 35.38 kg/m   Physical Exam: Gen: Intubated, sedated/unresponsive, husband at bedside CVS: Pulse regular rhythm, normal rate, S1 and S2 normal Resp: Anteriorly clear to auscultation without rales/rhonchi.  Right subclavian line. Abd: Soft, obese, nontender, bowel sounds normal Ext: Left upper arm AV fistula pulsatile.  With nonpitting lower extremity edema.  Labs: BMET Recent Labs  Lab  02/22/20 1642 02/23/20 0337 02/23/20 1536 02/23/20 2320 02/23/20 2358 02/24/20 0944 02/24/20 1130 02/24/20 1600 02/25/20 0340 02/25/20 1654 02/26/20 0533  NA 137 135   < > 142 137 140 140 140 137 140 140  K 4.5 4.5   < > 6.3* 4.9 4.2 4.0 3.6 3.4* 3.0* 2.9*  CL 103 101  --  100  --  96*  --  94* 93* 93* 94*  CO2 20* 24  --  11*  --  28  --  32 31 32 29  GLUCOSE 157* 127*  --  150*  --  342*  --  223* 219* 201* 138*  BUN 34* 25*  --  38*  --  47*  --  48* 46* 55* 61*  CREATININE 2.85* 1.95*  --  3.33*  --  3.67*  --  3.54* 3.37* 4.00* 4.72*  CALCIUM 10.0 9.9  --  11.0*  --  9.7  --  9.0 8.7* 8.9 8.8*  PHOS 5.4* 3.9  --   --   --  6.9*  --  5.6* 4.8* 5.2* 4.9*   < > = values in this interval not displayed.   CBC Recent Labs  Lab 02/23/20 2320 02/23/20 2358 02/24/20 0944 02/24/20 1130 02/25/20 0340 02/26/20 0533  WBC 82.3*  --  62.9*  --  54.0* 57.6*  HGB 11.6*   < > 9.9* 10.2* 8.4* 7.1*  HCT 39.8   < > 34.5* 30.0* 27.6* 23.7*  MCV 112.1*  --  109.5*  --  104.5* 108.2*  PLT 343  --  303  --  230 205   < > = values in this interval not displayed.  Medications:    . aspirin  81 mg Oral Daily  . atorvastatin  80 mg Per Tube QHS  . B-complex with vitamin C  1 tablet Per Tube Daily  . chlorhexidine gluconate (MEDLINE KIT)  15 mL Mouth Rinse BID  . Chlorhexidine Gluconate Cloth  6 each Topical Q0600  . docusate  100 mg Oral BID  . famotidine  20 mg Per Tube Daily  . feeding supplement (PROSource TF)  45 mL Per Tube BID  . insulin aspart  0-20 Units Subcutaneous Q4H  . insulin aspart  4 Units Subcutaneous Q4H  . insulin detemir  20 Units Subcutaneous BID  . mouth rinse  15 mL Mouth Rinse 10 times per day  . midodrine  10 mg Per Tube Q8H  . polyethylene glycol  17 g Oral Daily  . silver sulfADIAZINE   Topical BID   Elmarie Shiley, MD 02/26/2020, 9:27 AM

## 2020-02-26 NOTE — Progress Notes (Signed)
PCCM INTERVAL PROGRESS NOTE  Asked to evaluate patient for oozing central line despite multiple attempts at holding pressure and thrombipad. Pursestring suture placed around central line in sterile fashion. No visible oozing.    Georgann Housekeeper, AGACNP-BC [facility]  See Amion for personal pager PCCM on call pager [phone] until 7pm. Please call Elink 7p-7a. [phone]  02/26/2020 3:21 PM

## 2020-02-27 ENCOUNTER — Inpatient Hospital Stay (HOSPITAL_COMMUNITY): Payer: Medicare Other

## 2020-02-27 DIAGNOSIS — J189 Pneumonia, unspecified organism: Secondary | ICD-10-CM | POA: Diagnosis not present

## 2020-02-27 DIAGNOSIS — J9601 Acute respiratory failure with hypoxia: Secondary | ICD-10-CM | POA: Diagnosis not present

## 2020-02-27 DIAGNOSIS — N186 End stage renal disease: Secondary | ICD-10-CM | POA: Diagnosis not present

## 2020-02-27 LAB — RENAL FUNCTION PANEL
Albumin: 1.7 g/dL — ABNORMAL LOW (ref 3.5–5.0)
Albumin: 1.6 g/dL — ABNORMAL LOW (ref 3.5–5.0)
Anion gap: 11 (ref 5–15)
Anion gap: 13 (ref 5–15)
BUN: 38 mg/dL — ABNORMAL HIGH (ref 8–23)
BUN: 44 mg/dL — ABNORMAL HIGH (ref 8–23)
CO2: 29 mmol/L (ref 22–32)
CO2: 27 mmol/L (ref 22–32)
Calcium: 8.8 mg/dL — ABNORMAL LOW (ref 8.9–10.3)
Calcium: 8.7 mg/dL — ABNORMAL LOW (ref 8.9–10.3)
Chloride: 97 mmol/L — ABNORMAL LOW (ref 98–111)
Chloride: 99 mmol/L (ref 98–111)
Creatinine, Ser: 3.41 mg/dL — ABNORMAL HIGH (ref 0.44–1.00)
Creatinine, Ser: 3.99 mg/dL — ABNORMAL HIGH (ref 0.44–1.00)
GFR, Estimated: 15 mL/min — ABNORMAL LOW (ref >60)
GFR, Estimated: 12 mL/min — ABNORMAL LOW (ref >60)
Glucose, Bld: 192 mg/dL — ABNORMAL HIGH (ref 70–99)
Glucose, Bld: 114 mg/dL — ABNORMAL HIGH (ref 70–99)
Phosphorus: 4.6 mg/dL (ref 2.5–4.6)
Phosphorus: 4.6 mg/dL (ref 2.5–4.6)
Potassium: 3.2 mmol/L — ABNORMAL LOW (ref 3.5–5.1)
Potassium: 3.1 mmol/L — ABNORMAL LOW (ref 3.5–5.1)
Sodium: 137 mmol/L (ref 135–145)
Sodium: 139 mmol/L (ref 135–145)

## 2020-02-27 LAB — GLUCOSE, CAPILLARY
Glucose-Capillary: 105 mg/dL — ABNORMAL HIGH (ref 70–99)
Glucose-Capillary: 109 mg/dL — ABNORMAL HIGH (ref 70–99)
Glucose-Capillary: 149 mg/dL — ABNORMAL HIGH (ref 70–99)
Glucose-Capillary: 153 mg/dL — ABNORMAL HIGH (ref 70–99)
Glucose-Capillary: 163 mg/dL — ABNORMAL HIGH (ref 70–99)
Glucose-Capillary: 177 mg/dL — ABNORMAL HIGH (ref 70–99)
Glucose-Capillary: 197 mg/dL — ABNORMAL HIGH (ref 70–99)

## 2020-02-27 LAB — CBC
HCT: 21.2 % — ABNORMAL LOW (ref 36.0–46.0)
Hemoglobin: 6.5 g/dL — CL (ref 12.0–15.0)
MCH: 33.2 pg (ref 26.0–34.0)
MCHC: 30.7 g/dL (ref 30.0–36.0)
MCV: 108.2 fL — ABNORMAL HIGH (ref 80.0–100.0)
Platelets: 183 10*3/uL (ref 150–400)
RBC: 1.96 MIL/uL — ABNORMAL LOW (ref 3.87–5.11)
RDW: 20.2 % — ABNORMAL HIGH (ref 11.5–15.5)
WBC: 45.8 10*3/uL — ABNORMAL HIGH (ref 4.0–10.5)
nRBC: 5.1 % — ABNORMAL HIGH (ref 0.0–0.2)

## 2020-02-27 LAB — HEPARIN LEVEL (UNFRACTIONATED)
Heparin Unfractionated: 0.27 IU/mL — ABNORMAL LOW (ref 0.30–0.70)
Heparin Unfractionated: 0.29 IU/mL — ABNORMAL LOW (ref 0.30–0.70)
Heparin Unfractionated: 0.44 IU/mL (ref 0.30–0.70)

## 2020-02-27 LAB — PREPARE RBC (CROSSMATCH)

## 2020-02-27 LAB — MAGNESIUM: Magnesium: 2.1 mg/dL (ref 1.7–2.4)

## 2020-02-27 MED ORDER — SODIUM CHLORIDE 0.9% IV SOLUTION: Freq: Once | INTRAVENOUS | Status: DC

## 2020-02-27 MED ORDER — POTASSIUM CHLORIDE 20 MEQ PO PACK
20.0000 meq | PACK | Freq: Once | ORAL | Status: AC
  Administered 2020-02-27: 20 meq
  Filled 2020-02-27: qty 1

## 2020-02-27 MED ORDER — DEXMEDETOMIDINE HCL IN NACL 400 MCG/100ML IV SOLN
0.0000 ug/kg/h | INTRAVENOUS | Status: DC
  Administered 2020-02-27: 0.2 ug/kg/h via INTRAVENOUS
  Administered 2020-02-28 (×2): 0.4 ug/kg/h via INTRAVENOUS
  Administered 2020-02-28: 0.5 ug/kg/h via INTRAVENOUS
  Administered 2020-02-29: 1 ug/kg/h via INTRAVENOUS
  Administered 2020-02-29: 1.1 ug/kg/h via INTRAVENOUS
  Administered 2020-02-29 (×3): 1 ug/kg/h via INTRAVENOUS
  Administered 2020-03-01: 1.2 ug/kg/h via INTRAVENOUS
  Administered 2020-03-01: 1 ug/kg/h via INTRAVENOUS
  Administered 2020-03-01: 0.9 ug/kg/h via INTRAVENOUS
  Filled 2020-02-27 (×10): qty 100
  Filled 2020-02-27: qty 200
  Filled 2020-02-27 (×2): qty 100

## 2020-02-27 MED ORDER — SODIUM CHLORIDE 0.9% FLUSH
10.0000 mL | Freq: Two times a day (BID) | INTRAVENOUS | Status: DC
  Administered 2020-02-27 – 2020-02-29 (×5): 10 mL
  Administered 2020-03-01: 20 mL

## 2020-02-27 MED ORDER — SODIUM CHLORIDE 0.9% FLUSH
10.0000 mL | INTRAVENOUS | Status: DC | PRN
  Administered 2020-02-27: 10 mL

## 2020-02-27 NOTE — Progress Notes (Signed)
[place] for IV Hepairn Indication: Suspected pulmonary embolism post-thrombolytics  No Known Allergies  Patient Measurements: Height: 5\' 4"  (162.6 cm) Weight: 93.7 kg (206 lb 9.1 oz) IBW/kg (Calculated) : 54.7 Heparin Dosing Weight: 74.6 kg  Vital Signs: Temp: 98.1 F (36.7 C) (02/17 1745) BP: 150/44 (02/18 0002) Pulse Rate: 75 (02/18 0300)  Labs: Recent Labs    02/24/20 0944 02/24/20 1130 02/25/20 0340 02/25/20 1654 02/26/20 0533 02/26/20 2233 02/27/20 0354  HGB 9.9*   < > 8.4*  --  7.1*  --  6.5*  HCT 34.5*   < > 27.6*  --  23.7*  --  21.2*  PLT 303  --  230  --  205  --  183  APTT 73*  --   --   --   --   --   --   LABPROT 24.4*  --   --   --   --   --   --   INR 2.3*  --   --   --   --   --   --   HEPARINUNFRC 0.14*   < > 0.41  --  0.29*  --  0.27*  CREATININE 3.67*   < > 3.37* 4.00* 4.72* 3.16*  --    < > = values in this interval not displayed.    Estimated Creatinine Clearance: 20.7 mL/min (A) (by C-G formula based on SCr of 3.16 mg/dL (H)).  Assessment: 60 year old female with suspected pulmonary embolism status post thrombolytics (IV Tenecteplase) on 2/14.  Heparin held on 2/17 due to oozing from CVL site requiring thrombipad. Restarted 2/17 pm.  Heparin level slightly subtherapeutic (0.27) on gtt at 1300 units/hr. Hgb down to 6.5 (from 7.1 yesterday). RN states no issue with infusion and no more bleeding.  Goal of Therapy:  Heparin level 0.3 to 0.7 units/ml  Monitor platelets by anticoagulation protocol: Yes   Plan:  Increase heparin slightly to 1400 units/hr F/u 8 hr heparin level Will f/u Hgb closely  Sherlon Handing, PharmD, BCPS Please see amion for complete clinical pharmacist phone list 02/27/2020 4:48 AM

## 2020-02-27 NOTE — Progress Notes (Addendum)
eLink Physician-Brief Progress Note Patient Name: Rachel Chaney DOB: 04-Sep-1960 MRN: [id number]   Date of Service  02/27/2020  HPI/Events of Note  Hemoglobin 6.5 gm / dl, no evidence of active bleeding.  eICU Interventions  1 unit of PRBC ordered transfused. Informed consent for the blood transfusion was obtained from her husband Rachel Chaney.        Kerry Kass Melayah Skorupski 02/27/2020, 5:05 AM

## 2020-02-27 NOTE — Progress Notes (Signed)
ANTICOAGULATION CONSULT NOTE  Pharmacy Consult for IV Hepairn Indication: Suspected pulmonary emoblism post-thrombolytics  No Known Allergies  Patient Measurements: Height: 5\' 4"  (162.6 cm) Weight: 93.7 kg (206 lb 9.1 oz) IBW/kg (Calculated) : 54.7 Heparin Dosing Weight: 74.6 kg  Vital Signs: Temp: 97.9 F (36.6 C) (02/18 2154) Temp Source: Axillary (02/18 2154) BP: 139/43 (02/18 2007) Pulse Rate: 74 (02/18 2007)  Labs: Recent Labs    02/25/20 0340 02/25/20 1654 02/26/20 0533 02/26/20 2233 02/27/20 0354 02/27/20 1259 02/27/20 1708 02/27/20 2146  HGB 8.4*  --  7.1*  --  6.5*  --   --   --   HCT 27.6*  --  23.7*  --  21.2*  --   --   --   PLT 230  --  205  --  183  --   --   --   HEPARINUNFRC 0.41  --  0.29*  --  0.27* 0.29*  --  0.44  CREATININE 3.37*   < > 4.72* 3.16* 3.41*  --  3.99*  --    < > = values in this interval not displayed.    Estimated Creatinine Clearance: 16.4 mL/min (A) (by C-G formula based on SCr of 3.99 mg/dL (H)).  Assessment: 60 year old female with suspected pulmonary embolism status post thrombolytics (IV Tenecteplase) on 2/14.  Heparin level therapeutic this evening at 0.44. Hg low 6.5 this AM - s/p transfusion, plt wnl. Bleeding from IV site resolved  Goal of Therapy:  Heparin level 0.3 to 0.7 units/ml  Monitor platelets by anticoagulation protocol: Yes   Plan:  Continue heparin drip at 1500 units/hr Confirmatory heparin level with AM labs Monitor daily CBC, s/sx bleeding   Arturo Morton, PharmD, BCPS Please check AMION for all [place] contact numbers Clinical Pharmacist 02/27/2020 10:17 PM

## 2020-02-27 NOTE — Progress Notes (Signed)
Rachel Chaney for IV Hepairn Indication: Suspected pulmonary emoblism post-thrombolytics  No Known Allergies  Patient Measurements: Height: 5\' 4"  (162.6 cm) Weight: 93.7 kg (206 lb 9.1 oz) IBW/kg (Calculated) : 54.7 Heparin Dosing Weight: 74.6 kg  Vital Signs: Temp: 98 F (36.7 C) (02/18 0948) Temp Source: Axillary (02/18 0948) BP: 163/57 (02/18 1110) Pulse Rate: 77 (02/18 1200)  Labs: Recent Labs    02/25/20 0340 02/25/20 1654 02/26/20 0533 02/26/20 2233 02/27/20 0354 02/27/20 1259  HGB 8.4*  --  7.1*  --  6.5*  --   HCT 27.6*  --  23.7*  --  21.2*  --   PLT 230  --  205  --  183  --   HEPARINUNFRC 0.41  --  0.29*  --  0.27* 0.29*  CREATININE 3.37*   < > 4.72* 3.16* 3.41*  --    < > = values in this interval not displayed.    Estimated Creatinine Clearance: 19.2 mL/min (A) (by C-G formula based on SCr of 3.41 mg/dL (H)).  Assessment: 60 year old female with suspected pulmonary embolism status post thrombolytics (IV Tenecteplase) on 2/14.  Hep lvl slightly low Bleeding from IV site resolved  Goal of Therapy:  Heparin level 0.3 to 0.7 units/ml  Monitor platelets by anticoagulation protocol: Yes   Plan:  Increase heparin 1500 units/hr Recheck this evening Daily hep lvl cbc  Barth Kirks, PharmD, BCPS, BCCCP Clinical Pharmacist [phone]  Please check AMION for all [place] numbers  02/27/2020 2:31 PM

## 2020-02-27 NOTE — Progress Notes (Signed)
[name], MRN:  [id number], DOB:  1960/04/19, LOS: 33 ADMISSION DATE:  02/18/2020, CONSULTATION DATE:  02/20/2020 REFERRING MD:  FMTS, CHIEF COMPLAINT:  PEA arrest    Brief History:  Rachel Chaney is a 60 y.o. female with a pPMH of ESRD on HD (MWF), Chronic hypoxic respiratory failure, T2DM, HFpEF, HTN, HLD, CAD s/p MI (2020) who presented to [facility] with chronic nonspecific skin pain over her bilateral lower extremities and her hospitalization has been complicated by acute on chronic hypoxic respiratory failure and hypotension.   Patient had PEA cardiac arrest during dialysis on 2/4, she was transferred to ICU. She was covid positive prior to this admit on 01/31/20  Past Medical History:  ESRD  Type II diabetes  HLD HTN Cardiac arrest  CHF  Significant Hospital Events:  02/02 - Admitted for leg pain found hypoxic and hypotensive.  02/04 - PEA arrest xxxxxxx 2/11 - Precedex weaned off this morning Remains on insulin drip but likely can be transitioned levo 3. 0.50, currently on pressure support ventilation. Scheduled for hemodialysis later today 2/12 - remains extubated  since yesterday.  Overnight  Needing pressors.  Ongoping challenges with measuring BP -> being measured RLE. On [place] -o2 -following some commands. Deconditioned and weak. Difffuse pain per RN 2/14: Patient became unresponsive, not hypoglycemic, hypotensive and hypoxic. Reintubated due to change in her mental status and hypoxia. Bedside echocardiogram was done and showed RV dilatation and hypokinesis, new compared to the echocardiogram which was done 3 weeks ago.  With sudden change in hemodynamics, decision was made to give [facility] and then patient was started on heparin infusion   Consults:  Nephrology  Cardiology  Procedures:  ETT 2/4> 2/11 ETT 2/14 > Right subclavian HD 2/15 > Left IJ CVC 2/15 > Right axillary art line 2/15 >  Significant Diagnostic Tests:  01/23 Echo > 60 to 65%  02/02 CXR > Patchy  BILATERAL pulmonary infiltrates favor multifocal pneumonia over pulmonary edema.  02/02 CT Abdomen/Pelvis > No acute abnormality. Multiple chronic findings as detailed above, not substantially changed from recent prior study.  02/02 CT left and right femur > No acute osseous abnormality. There is nonspecific lower extremity edema without evidence for an abscess. Findings are nonspecific but can be seen in patients with cellulitis in the appropriate clinical setting. Other differential considerations include chronic venous insufficiency given the presence of subcutaneous calcifications.  2/16 BL PVL > No evidence of deep vein thrombosis in bilateral lower extremities, though study is limited by body habitus, bandages, and poor ultrasound/tissue interface due to skin changes.   2/17 Echo > Hyperdynamic LV function EF >75%, RV appears similar to prior. There is a left  pleural effusion visualized but only a trivial pericardial effusion, no tamponade noted.   Micro Data:  01/31/20 - covid positive xxxx MRSA PCR 2/4 > neg Blood culture 2/4 > neg Respiratory 2/4 > neg Respiratory culture 2/15 > negative C. difficile 2/15 >> negative  Antimicrobials:  Zosyn 2/4 > 2/11-- 2/14 > 2/21 Vancomycin 2/2 > 2/7-- 2/15  P.o. vancomycin 2/15  > 2/16  Interim History / Subjective:  With bleeding around central line requiring pursestring suture overnight; hemoglobin dropped this morning and getting PRBC. Remains ventilated on pressors (levo 5 mcg/min).  Objective   Blood pressure (!) 138/43, pulse 81, temperature (!) 97.5 F (36.4 C), temperature source Oral, resp. rate (!) 30, height 5\' 4"  (1.626 m), weight 93.7 kg, last menstrual period 09/18/2011, SpO2 100 %.  Vent Mode: PRVC FiO2 (%):  [30 %] 30 % Set Rate:  [30 bmp] 30 bmp Vt Set:  [330 mL] 330 mL PEEP:  [8 cmH20] 8 cmH20 Plateau Pressure:  [19 cmH20-20 cmH20] 19 cmH20   Intake/Output Summary (Last 24 hours) at 02/27/2020 0853 Last data  filed at 02/27/2020 0700 Gross per 24 hour  Intake 2076.11 ml  Output 100 ml  Net 1976.11 ml   Filed Weights   02/26/20 0500 02/26/20 1515 02/26/20 1745  Weight: 93.5 kg 93.5 kg 93.7 kg     General Appearance: Middle-age obese African-American female, lying on the bed, orally intubated. Head:  Normocephalic, without obvious abnormality, atraumatic Eyes:  Pupils equal, round, sluggishly reactive bilaterally     Neck:  Supple,  No enlargement/tenderness/nodules Lungs: Clear to auscultation bilaterally, Distant breath sounds Heart:  S1 and S2 normal, no murmur Abdomen:  Soft, no masses, no organomegaly Skin: Scattered ecchymoses in lower abdomen, right inner thigh with chronic appearing calciphylaxis.  Neurologic: Eyes open to voice though not tracking, not following commands, moving all extremities.   Resolved Hospital Problem list      Assessment & Plan:  ESRD on HD MWF: BUN and Cr slightly improved after iHD yesterday, tolerated well. - Appreciate nephrology input - iHD tomorrow, plan to transition to MWF schedule for next week  Acute on chronic hypoxic/hypercapnic respiratory failure: Pt uses CPAP at night and 3L [place] during the day related to OSA/OHS. She has been treated for HCAP with Zosyn ending 2/11 for a 7 day course. She also had known Covid PNA in January with reactive IgG (2/12) and completed therapy with remdesivir and steroid. This AM ABG improved without hypercapnia after adjustment of vent settings yesterday. CXR yesterday largely unchanged from prior (2/15).  - Continue to monitor and trend ABGs - Intermittent CXR  Presumed massive PE with right heart strain Patient had sudden change in hemodynamics 21/4, bedside POCUS showed large RV, compressing LV. Patient was given TNKase, followed by she was started on heparin infusion. LE dopplers negative for DVT bilaterally though limited due to body habitus and skin changes. Echo showed hyperdynamic LV function, RV appears  similar to prior. There is a left  pleural effusion visualized but only a trivial pericardial effusion, no tamponade noted.   Septic shock Likely due to HCAP. C. Diff antigen negative, stool output has not been significant.  White count today improved from 57 yesterday to 45 today. Respiratory and blood cultures negative.  - Continue IV Levophed with map goal 65 - Continue Zosyn 7 day course (last day 2/21).   Acute metabolic encephalopathy: Mental status worsened in setting of hypoxia, suspected massive PE requiring sedation and intubation (2/14). CT head shows no acute or reversible findings.  - Wean sedation, currently on fentanyl gtt - Consider MRI if not waking up despite no sedation  Mixed metabolic and respiratory acidosis (resolved) Patient was on bicarbonate infusion, it was stopped as she was started on CRRT Vent settings were adjusted to clear hypercapnia.  Status post PEA Arrest x2 presumably due to Septic Shock (present on admission) Continue telemetry monitoring  Anemia of chronic and critical illness Hgb dropped to 6.5 last night, likely due to bleeding around catheter site. Received 1 unit pRBC.  - Transfuse if less than 7  T2DM, poorly controlled with hyperglycemia Continue sliding scale insulin CBG goal of 140-180  Best practice (evaluated daily)  Diet: NPO/tube feeds Pain/Anxiety/Delirium protocol (if indicated): none VAP protocol (if indicated): In place  DVT  prophylaxis: Heparin SQ GI prophylaxis: h2 blockade Glucose control: SSI Mobility: Bedrest Disposition:ICU Family communication: Patient's husband was updated at bedside Code Status: Full  Goals of care discussion: 2/17.  Patient's husband was updated regarding current condition of patient and poor prognosis considering she had multiple cardiac arrest during this admission with prior multiple medical problems. Explained that patient's ability to tolerate dialysis plays a big role in determining  prognosis. For now he would like to continue aggressive care and keeping her full code pending further evaluation. LABS    PULMONARY Recent Labs  Lab 02/21/20 0849 02/23/20 1536 02/23/20 2358 02/24/20 1130 02/25/20 0335  PHART 7.284* 7.116* 7.009* 7.296* 7.482*  PCO2ART 41.2 28.0* 55.3* 75.9* 46.4  PO2ART 66* 81* 225* 84 140*  HCO3 19.4* 9.1* 13.9* 37.0* 34.5*  TCO2 21* 10* 16* 39*  --   O2SAT 89.0 92.0 99.0 94.0 99.4    CBC Recent Labs  Lab 02/25/20 0340 02/26/20 0533 02/27/20 0354  HGB 8.4* 7.1* 6.5*  HCT 27.6* 23.7* 21.2*  WBC 54.0* 57.6* 45.8*  PLT 230 205 183    COAGULATION Recent Labs  Lab 02/23/20 2320 02/24/20 0944  INR 2.8* 2.3*    CARDIAC  No results for input(s): TROPONINI in the last 168 hours. No results for input(s): PROBNP in the last 168 hours.   CHEMISTRY Recent Labs  Lab 02/23/20 0337 02/23/20 1536 02/24/20 0944 02/24/20 1130 02/25/20 0340 02/25/20 1654 02/26/20 0533 02/26/20 2233 02/27/20 0354  NA 135   < > 140   < > 137 140 140 139 137  K 4.5   < > 4.2   < > 3.4* 3.0* 2.9* 3.1* 3.2*  CL 101   < > 96*   < > 93* 93* 94* 96* 97*  CO2 24   < > 28   < > 31 32 29 30 29   GLUCOSE 127*   < > 342*   < > 219* 201* 138* 182* 192*  BUN 25*   < > 47*   < > 46* 55* 61* 33* 38*  CREATININE 1.95*   < > 3.67*   < > 3.37* 4.00* 4.72* 3.16* 3.41*  CALCIUM 9.9   < > 9.7   < > 8.7* 8.9 8.8* 8.5* 8.8*  MG 2.6*  --  2.6*  --  2.2  --  2.3  --  2.1  PHOS 3.9  --  6.9*   < > 4.8* 5.2* 4.9* 4.0 4.6   < > = values in this interval not displayed.   Estimated Creatinine Clearance: 19.2 mL/min (A) (by C-G formula based on SCr of 3.41 mg/dL (H)).   LIVER Recent Labs  Lab 02/22/20 1029 02/22/20 1642 02/23/20 2320 02/24/20 0944 02/24/20 1600 02/25/20 0340 02/25/20 1654 02/26/20 0533 02/26/20 2233 02/27/20 0354  AST 23  --   --   --   --   --   --   --   --   --   ALT 27  --   --   --   --   --   --   --   --   --   ALKPHOS 102  --   --   --    --   --   --   --   --   --   BILITOT 1.0  --   --   --   --   --   --   --   --   --  PROT 6.3*  --   --   --   --   --   --   --   --   --   ALBUMIN 2.0*   < >  --  2.0*   < > 1.7* 1.7* 1.7* 1.6* 1.7*  INR  --   --  2.8* 2.3*  --   --   --   --   --   --    < > = values in this interval not displayed.     INFECTIOUS Recent Labs  Lab 02/22/20 1030 02/23/20 1634 02/24/20 1129  LATICACIDVEN 1.5 >11.0* 2.2*     ENDOCRINE CBG (last 3)  Recent Labs    02/27/20 0007 02/27/20 0358 02/27/20 [place], MS4 [facility]

## 2020-02-27 NOTE — Progress Notes (Signed)
Patient ID: Early Chars, female   DOB: 07-Jan-1961, 60 y.o.   MRN: [id number]  [facility] Progress Note   Assessment/ Plan:   1.  Status post PEA arrest: Secondary to healthcare associated pneumonia/sepsis preceding hospitalization.  Reintubated after suffering cardiac arrest earlier this week-etiology suspected to be possibly from PE and patient treated with tenecteplase and now on heparin drip. 2. ESRD: On a Monday/Wednesday/Friday dialysis schedule as an outpatient-underwent hemodialysis yesterday via left upper arm AV fistula without problems and I will order dialysis again for tomorrow with evaluation over the weekend.  She does not have any indication for dialysis today and will plan to transition to MWF schedule for next week. 3. Anemia: Hemoglobin and hematocrit lower with oozing around central line/IV access possibly associated with recent tenecteplase use/heparin.  PRBC transfusion at this time. 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 5. Nutrition: Continue tube feeds via NGT and supplementations per RD recommendations. 6.  Acute hypoxic respiratory failure: Secondary to combination of Covid pneumonia and volume excess/pulmonary edema and now PE.  Will order for hemodialysis again tomorrow and attempt UF.  Subjective:   With bleeding around central line requiring pursestring suture overnight; hemoglobin dropped this morning and getting PRBC.   Objective:   BP (!) 138/43   Pulse 81   Temp (!) 97.5 F (36.4 C) (Oral)   Resp (!) 30   Ht '5\' 4"'  (1.626 m)   Wt 93.7 kg   LMP 09/18/2011   SpO2 100%   BMI 35.46 kg/m   Physical Exam: Gen: Intubated, appears more awake today, husband at bedside CVS: Pulse regular rhythm, normal rate, S1 and S2 normal Resp: Anteriorly clear to auscultation without  rales/rhonchi.  Right subclavian line. Abd: Soft, obese, nontender, bowel sounds normal Ext: Left upper arm AV fistula pulsatile.  With nonpitting lower extremity edema.  Labs: BMET Recent Labs  Lab 02/24/20 0944 02/24/20 1130 02/24/20 1600 02/25/20 0340 02/25/20 1654 02/26/20 0533 02/26/20 2233 02/27/20 0354  NA 140 140 140 137 140 140 139 137  K 4.2 4.0 3.6 3.4* 3.0* 2.9* 3.1* 3.2*  CL 96*  --  94* 93* 93* 94* 96* 97*  CO2 28  --  32 31 32 '29 30 29  ' GLUCOSE 342*  --  223* 219* 201* 138* 182* 192*  BUN 47*  --  48* 46* 55* 61* 33* 38*  CREATININE 3.67*  --  3.54* 3.37* 4.00* 4.72* 3.16* 3.41*  CALCIUM 9.7  --  9.0 8.7* 8.9 8.8* 8.5* 8.8*  PHOS 6.9*  --  5.6* 4.8* 5.2* 4.9* 4.0 4.6   CBC Recent Labs  Lab 02/24/20 0944 02/24/20 1130 02/25/20 0340 02/26/20 0533 02/27/20 0354  WBC 62.9*  --  54.0* 57.6* 45.8*  HGB 9.9* 10.2* 8.4* 7.1* 6.5*  HCT 34.5* 30.0* 27.6* 23.7* 21.2*  MCV 109.5*  --  104.5* 108.2* 108.2*  PLT 303  --  230 205 183     Medications:    . sodium chloride   Intravenous Once  . aspirin  81 mg Per Tube Daily  . atorvastatin  80 mg Per Tube QHS  . B-complex with vitamin C  1 tablet Per Tube Daily  . chlorhexidine gluconate (MEDLINE KIT)  15 mL Mouth Rinse BID  . Chlorhexidine Gluconate Cloth  6 each Topical Q0600  . docusate  100 mg Per Tube BID  . famotidine  20 mg Per Tube Daily  . feeding supplement (PROSource TF)  45 mL Per Tube BID  . insulin aspart  0-20 Units Subcutaneous Q4H  . insulin aspart  4 Units Subcutaneous Q4H  . insulin detemir  20 Units Subcutaneous BID  . mouth rinse  15 mL Mouth Rinse 10 times per day  . midodrine  10 mg Per Tube Q8H  . polyethylene glycol  17 g Per Tube Daily  . potassium chloride  20 mEq Per Tube Once  . silver sulfADIAZINE   Topical BID  . sodium chloride flush  10-40 mL Intracatheter Q12H   Elmarie Shiley, MD 02/27/2020, 9:03 AM

## 2020-02-28 DIAGNOSIS — J189 Pneumonia, unspecified organism: Secondary | ICD-10-CM | POA: Diagnosis not present

## 2020-02-28 DIAGNOSIS — J9601 Acute respiratory failure with hypoxia: Secondary | ICD-10-CM | POA: Diagnosis not present

## 2020-02-28 LAB — TYPE AND SCREEN
ABO/RH(D): O POS
Antibody Screen: NEGATIVE
Unit division: 0

## 2020-02-28 LAB — GLUCOSE, CAPILLARY
Glucose-Capillary: 111 mg/dL — ABNORMAL HIGH (ref 70–99)
Glucose-Capillary: 135 mg/dL — ABNORMAL HIGH (ref 70–99)
Glucose-Capillary: 141 mg/dL — ABNORMAL HIGH (ref 70–99)
Glucose-Capillary: 196 mg/dL — ABNORMAL HIGH (ref 70–99)
Glucose-Capillary: 230 mg/dL — ABNORMAL HIGH (ref 70–99)
Glucose-Capillary: 233 mg/dL — ABNORMAL HIGH (ref 70–99)
Glucose-Capillary: 91 mg/dL (ref 70–99)

## 2020-02-28 LAB — BPAM RBC
Blood Product Expiration Date: 202203172359
ISSUE DATE / TIME: 202202180920
Unit Type and Rh: 5100

## 2020-02-28 LAB — RENAL FUNCTION PANEL
Albumin: 1.7 g/dL — ABNORMAL LOW (ref 3.5–5.0)
Anion gap: 12 (ref 5–15)
BUN: 50 mg/dL — ABNORMAL HIGH (ref 8–23)
CO2: 25 mmol/L (ref 22–32)
Calcium: 8.9 mg/dL (ref 8.9–10.3)
Chloride: 99 mmol/L (ref 98–111)
Creatinine, Ser: 4.34 mg/dL — ABNORMAL HIGH (ref 0.44–1.00)
GFR, Estimated: 11 mL/min — ABNORMAL LOW (ref >60)
Glucose, Bld: 155 mg/dL — ABNORMAL HIGH (ref 70–99)
Phosphorus: 5.2 mg/dL — ABNORMAL HIGH (ref 2.5–4.6)
Potassium: 3.5 mmol/L (ref 3.5–5.1)
Sodium: 136 mmol/L (ref 135–145)

## 2020-02-28 LAB — CBC
HCT: 25.2 % — ABNORMAL LOW (ref 36.0–46.0)
Hemoglobin: 7.6 g/dL — ABNORMAL LOW (ref 12.0–15.0)
MCH: 31.7 pg (ref 26.0–34.0)
MCHC: 30.2 g/dL (ref 30.0–36.0)
MCV: 105 fL — ABNORMAL HIGH (ref 80.0–100.0)
Platelets: 145 10*3/uL — ABNORMAL LOW (ref 150–400)
RBC: 2.4 MIL/uL — ABNORMAL LOW (ref 3.87–5.11)
RDW: 20.4 % — ABNORMAL HIGH (ref 11.5–15.5)
WBC: 41.7 10*3/uL — ABNORMAL HIGH (ref 4.0–10.5)
nRBC: 2.7 % — ABNORMAL HIGH (ref 0.0–0.2)

## 2020-02-28 LAB — MAGNESIUM: Magnesium: 2.2 mg/dL (ref 1.7–2.4)

## 2020-02-28 LAB — HEPARIN LEVEL (UNFRACTIONATED): Heparin Unfractionated: 0.4 IU/mL (ref 0.30–0.70)

## 2020-02-28 LAB — VANCOMYCIN, RANDOM: Vancomycin Rm: 21

## 2020-02-28 MED ORDER — VANCOMYCIN HCL IN DEXTROSE 750-5 MG/150ML-% IV SOLN: 750.0000 mg | INTRAVENOUS | Status: DC

## 2020-02-28 NOTE — Progress Notes (Signed)
[place] for IV Hepairn Indication: Suspected pulmonary emoblism post-thrombolytics  No Known Allergies  Patient Measurements: Height: 5\' 4"  (162.6 cm) Weight: 97.1 kg (214 lb 1.1 oz) IBW/kg (Calculated) : 54.7 Heparin Dosing Weight: 74.6 kg  Vital Signs: Temp: 98.2 F (36.8 C) (02/19 0425) Temp Source: Oral (02/19 0425) BP: 107/39 (02/19 0600) Pulse Rate: 72 (02/19 0500)  Labs: Recent Labs    02/26/20 0533 02/26/20 2233 02/27/20 0354 02/27/20 1259 02/27/20 1708 02/27/20 2146 02/28/20 0350  HGB 7.1*  --  6.5*  --   --   --  7.6*  HCT 23.7*  --  21.2*  --   --   --  25.2*  PLT 205  --  183  --   --   --  145*  HEPARINUNFRC 0.29*  --  0.27* 0.29*  --  0.44 0.40  CREATININE 4.72*   < > 3.41*  --  3.99*  --  4.34*   < > = values in this interval not displayed.    Estimated Creatinine Clearance: 15.4 mL/min (A) (by C-G formula based on SCr of 4.34 mg/dL (H)).  Assessment: 60 year old female with suspected pulmonary embolism status post thrombolytics (IV Tenecteplase) on 2/14.  Heparin level therapeutic this evening at 0.4. Hgb 7.6 s/p transfusion. Platelets are trending down now - down to 145 this AM.  Bleeding from IV site resolved  Goal of Therapy:  Heparin level 0.3 to 0.7 units/ml  Monitor platelets by anticoagulation protocol: Yes   Plan:  Continue heparin drip at 1500 units/hr Confirmatory heparin level with AM labs Monitor daily CBC, s/sx bleeding Watch Platelet trend closely  Sloan Leiter, PharmD, BCPS, BCCCP Clinical Pharmacist Please refer to [facility] for [place] numbers 02/28/2020 7:16 AM

## 2020-02-28 NOTE — Progress Notes (Signed)
Pharmacy Antibiotic Note  Rachel Chaney is a 60 y.o. female admitted on 02/10/2020 with respiratory failure.  Pharmacy consulted for Vancomycin dosing. Also on Zosyn.   Pre-HD level 2/17 was 28.  Last HD 2/18 for 2.5 hrs, just filtered - no fluid removal.   Pre HD level today 2/19 is 21 - ~25% removal by last short HD session.  Plan for further HD today.    Height: 5\' 4"  (162.6 cm) Weight: 97.1 kg (214 lb 1.1 oz) IBW/kg (Calculated) : 54.7  Temp (24hrs), Avg:98.1 F (36.7 C), Min:97.8 F (36.6 C), Max:98.5 F (36.9 C)  Recent Labs  Lab 02/21/20 1105 02/22/20 1029 02/22/20 1030 02/22/20 1642 02/23/20 1634 02/23/20 2320 02/24/20 0944 02/24/20 1129 02/24/20 1600 02/25/20 0340 02/25/20 1654 02/26/20 0533 02/26/20 2233 02/27/20 0354 02/27/20 1708 02/28/20 0350 02/28/20 0351  WBC  --   --  28.5*   < >  --    < > 62.9*  --   --  54.0*  --  57.6*  --  45.8*  --  41.7*  --   CREATININE  --    < >  --    < >  --    < > 3.67*  --    < > 3.37*   < > 4.72* 3.16* 3.41* 3.99* 4.34*  --   LATICACIDVEN 1.7  --  1.5  --  >11.0*  --   --  2.2*  --   --   --   --   --   --   --   --   --   VANCORANDOM  --   --   --   --   --   --   --   --   --   --   --  28  --   --   --   --  21   < > = values in this interval not displayed.    Estimated Creatinine Clearance: 15.4 mL/min (A) (by C-G formula based on SCr of 4.34 mg/dL (H)).    No Known Allergies  Plan: Re-dose Vancomycin post HD today at lower dose of 750 mg due to short HD sessions.  Continue Zosyn 3.375 gm Iv q12h Will see if tolerated iHD - likely recheck lvl Mon am  Sloan Leiter, PharmD, BCPS, BCCCP Clinical Pharmacist Please check AMION for all [place] numbers  02/28/2020 7:19 AM

## 2020-02-28 NOTE — Progress Notes (Signed)
Patient ID: Rachel Chaney, female   DOB: May 08, 1960, 60 y.o.   MRN: [id number] [facility] [facility] Progress Note   Assessment/ Plan:   1.  Status post PEA arrest: Secondary to healthcare associated pneumonia/sepsis preceding hospitalization.  Reintubated after suffering cardiac arrest earlier this week-etiology suspected to be possibly from PE and patient treated with tenecteplase and now on heparin drip. 2. ESRD: On a Monday/Wednesday/Friday dialysis schedule as an outpatient-underwent hemodialysis yesterday via left upper arm AV fistula without problems and on schedule for dialysis today.  Ultrafiltration likely to be challenging in the setting of ongoing low-dose pressors. 3. Anemia: Hemoglobin and hematocrit lower with oozing around central line/IV access possibly associated with recent tenecteplase use/heparin.  Status post PRBC transfusion yesterday with appropriate rise of hemoglobin/hematocrit. 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 5. Nutrition: Continue tube feeds via NGT and supplementations per RD recommendations. 6.  Acute hypoxic respiratory failure: Secondary to combination of Covid pneumonia and volume excess/pulmonary edema and now PE.  On schedule for dialysis today.  Subjective:   Without acute events noted overnight.   Objective:   BP (!) 86/70 (BP Location: Left Leg)   Pulse 79   Temp 98.3 F (36.8 C) (Oral)   Resp 16   Ht '5\' 4"'  (1.626 m)   Wt 97.1 kg   LMP 09/18/2011   SpO2 98%   BMI 36.74 kg/m   Physical Exam: Gen: Intubated, awakens to calling out her name CVS: Pulse regular rhythm, normal rate, S1 and S2 normal Resp: Anteriorly clear to auscultation without rales/rhonchi.  Right subclavian line. Abd: Soft, obese, nontender, bowel sounds normal Ext: Left upper arm AV fistula pulsatile.  With  nonpitting lower extremity edema.  Labs: BMET Recent Labs  Lab 02/25/20 0340 02/25/20 1654 02/26/20 0533 02/26/20 2233 02/27/20 0354 02/27/20 1708 02/28/20 0350  NA 137 140 140 139 137 139 136  K 3.4* 3.0* 2.9* 3.1* 3.2* 3.1* 3.5  CL 93* 93* 94* 96* 97* 99 99  CO2 31 32 '29 30 29 27 25  ' GLUCOSE 219* 201* 138* 182* 192* 114* 155*  BUN 46* 55* 61* 33* 38* 44* 50*  CREATININE 3.37* 4.00* 4.72* 3.16* 3.41* 3.99* 4.34*  CALCIUM 8.7* 8.9 8.8* 8.5* 8.8* 8.7* 8.9  PHOS 4.8* 5.2* 4.9* 4.0 4.6 4.6 5.2*   CBC Recent Labs  Lab 02/25/20 0340 02/26/20 0533 02/27/20 0354 02/28/20 0350  WBC 54.0* 57.6* 45.8* 41.7*  HGB 8.4* 7.1* 6.5* 7.6*  HCT 27.6* 23.7* 21.2* 25.2*  MCV 104.5* 108.2* 108.2* 105.0*  PLT 230 205 183 145*     Medications:    . sodium chloride   Intravenous Once  . aspirin  81 mg Per Tube Daily  . atorvastatin  80 mg Per Tube QHS  . B-complex with vitamin C  1 tablet Per Tube Daily  . chlorhexidine gluconate (MEDLINE KIT)  15 mL Mouth Rinse BID  . Chlorhexidine Gluconate Cloth  6 each Topical Q0600  . docusate  100 mg Per Tube BID  . famotidine  20 mg Per Tube Daily  . feeding supplement (PROSource TF)  45 mL Per Tube BID  . insulin aspart  0-20 Units Subcutaneous Q4H  . insulin aspart  4 Units Subcutaneous Q4H  . insulin detemir  20 Units Subcutaneous BID  . mouth rinse  15 mL Mouth Rinse 10 times per day  . midodrine  10 mg Per Tube Q8H  . polyethylene glycol  17 g Per Tube Daily  . silver sulfADIAZINE   Topical BID  . sodium chloride flush  10-40 mL Intracatheter Q12H   Elmarie Shiley, MD 02/28/2020, 8:58 AM

## 2020-02-28 NOTE — Progress Notes (Signed)
Rachel Chaney, MRN:  [id number], DOB:  1960/10/30, LOS: 32 ADMISSION DATE:  03/02/2020, CONSULTATION DATE:  02/23/2020 REFERRING MD:  FMTS, CHIEF COMPLAINT:  PEA arrest    Brief History:  Rachel Chaney is a 60 y.o. female with a pPMH of ESRD on HD (MWF), Chronic hypoxic respiratory failure, T2DM, HFpEF, HTN, HLD, CAD s/p MI (2020) who presented to [facility] with chronic nonspecific skin pain over her bilateral lower extremities and her hospitalization has been complicated by acute on chronic hypoxic respiratory failure and hypotension.   Patient had PEA cardiac arrest during dialysis on 2/4, she was transferred to ICU. She was covid positive prior to this admit on 01/31/20  Past Medical History:  ESRD, Type II diabetes, HLD, HTN, Cardiac arrest, CHF  Significant Hospital Events:  02/02 - Admitted for leg pain found hypoxic and hypotensive.  02/04 - PEA arrest xxxxxxx 2/11 - Precedex weaned off this morning Remains on insulin drip but likely can be transitioned levo 3. 0.50, currently on pressure support ventilation. Scheduled for hemodialysis later today 2/12 - remains extubated  since yesterday.  Overnight  Needing pressors.  Ongoping challenges with measuring BP -> being measured RLE. On [place] -o2 -following some commands. Deconditioned and weak. Difffuse pain per RN 2/14: Patient became unresponsive, not hypoglycemic, hypotensive and hypoxic. Reintubated due to change in her mental status and hypoxia. Bedside echocardiogram was done and showed RV dilatation and hypokinesis, new compared to the echocardiogram which was done 3 weeks ago.  With sudden change in hemodynamics, decision was made to give [facility] and then patient was started on heparin infusion  2/17 transitioned to [facility] 2//18 bleeding from CVL site requiring PRBC transfusion; resolved after placing extra suture 2/19 off levophed  Consults:  Nephrology  Cardiology  Procedures:  ETT 2/4> 2/11 ETT 2/14 > Right subclavian HD 2/15  > Left IJ CVC 2/15 > Right axillary art line 2/15 >  Significant Diagnostic Tests:   01/23 Echo > 60 to 65%  02/02 CT Abdomen/Pelvis > No acute abnormality. Multiple chronic findings as detailed above, not substantially changed from recent prior study.  02/02 CT left and right femur > No acute osseous abnormality. There is nonspecific lower extremity edema without evidence for an abscess. Findings are nonspecific but can be seen in patients with cellulitis in the appropriate clinical setting. Other differential considerations include chronic venous insufficiency given the presence of subcutaneous calcifications.  2/16 BL PVL > No evidence of deep vein thrombosis in bilateral lower extremities, though study is limited by body habitus, bandages, and poor ultrasound/tissue interface due to skin changes.   2/17 Echo > Hyperdynamic LV function EF >75%, RV appears similar to prior. There is a left pleural effusion visualized but only a trivial pericardial effusion, no tamponade noted.   Micro Data:  01/31/20 - covid positive xxxx MRSA PCR 2/4 > neg Blood culture 2/4 > neg Respiratory 2/4 > neg Respiratory culture 2/15 > Candida albicans and tropicalis C. difficile 2/15 >> negative Blood 2/15 >>   Antimicrobials:  Vancomycin 2/14 >> Cefepime 2/14 Zosyn 2/15 >>   Interim History / Subjective:  Tolerate some pressure support.  Off pressors; a line pressure higher than cuff pressure measured in leg.  Remains on sedation.  Objective   Blood pressure (!) 86/70, pulse 79, temperature 98.3 F (36.8 C), temperature source Oral, resp. rate 16, height 5\' 4"  (1.626 m), weight 97.1 kg, last menstrual period 09/18/2011, SpO2 98 %.    Vent Mode: PCV  FiO2 (%):  [30 %] 30 % Set Rate:  [20 bmp] 20 bmp PEEP:  [5 cmH20] 5 cmH20 Plateau Pressure:  [18 cmH20-21 cmH20] 18 cmH20   Intake/Output Summary (Last 24 hours) at 02/28/2020 1030 Last data filed at 02/28/2020 0600 Gross per 24 hour  Intake  2213.09 ml  Output 375 ml  Net 1838.09 ml   Filed Weights   02/26/20 1515 02/26/20 1745 02/28/20 0500  Weight: 93.5 kg 93.7 kg 97.1 kg    General - sedated Eyes - pupils reactive ENT - ETT in place Cardiac - regular rate/rhythm, no murmur Chest - equal breath sounds b/l, no wheezing or rales Abdomen - soft, non tender, + bowel sounds Extremities - 1+ edema Skin - no rashes Neuro - RASS -1  Resolved Hospital Problem list   Diarrhea, Septic shock from HCAP, Metabolic acidosis with lactic acidosis, PEA cardiac arrest  Assessment & Plan:   Acute on chronic hypoxic/hypercapnic respiratory failure from HCAP after recent COVID 19 infection. Hx of OSA/OHS. - pressure support wean as tolerated - goal SpO2 > 92% - f/u CXR intermittently - might need trach to assist with vent weaning - day 6/7 of ABx  ESRD on HD MWF. - nephrology following  Presumed PE with Rt heart strain causing hemodynamic collapse on 2/14. - treated with thrombolytic therapy - continue heparin gtt  Hx of chronic hypotension, HLD. - continue ASA, lipitor, midodrine  Acute metabolic encephalopathy from hypoxia, sepsis. - RASS goal 0 to -1  Anemia of critical illness and chronic disease. - f/u CBC - transfuse for Hb < 7 or significant bleeding  DM type 2 poorly controlled with hyperglycemia. - SSI with levemir and tube feed coverage  Best practice (evaluated daily)  Diet: tube feeds DVT prophylaxis: heparin gtt GI prophylaxis: pepcid Mobility: Bedrest Disposition: ICU Family communication: husband update on 2/18 Code Status: Full  LABS   CMP Latest Ref Rng & Units 02/28/2020 02/27/2020 02/27/2020  Glucose 70 - 99 mg/dL 155(H) 114(H) 192(H)  BUN 8 - 23 mg/dL 50(H) 44(H) 38(H)  Creatinine 0.44 - 1.00 mg/dL 4.34(H) 3.99(H) 3.41(H)  Sodium 135 - 145 mmol/L 136 139 137  Potassium 3.5 - 5.1 mmol/L 3.5 3.1(L) 3.2(L)  Chloride 98 - 111 mmol/L 99 99 97(L)  CO2 22 - 32 mmol/L 25 27 29   Calcium 8.9 -  10.3 mg/dL 8.9 8.7(L) 8.8(L)  Total Protein 6.5 - 8.1 g/dL - - -  Total Bilirubin 0.3 - 1.2 mg/dL - - -  Alkaline Phos 38 - 126 U/L - - -  AST 15 - 41 U/L - - -  ALT 0 - 44 U/L - - -    CBC Latest Ref Rng & Units 02/28/2020 02/27/2020 02/26/2020  WBC 4.0 - 10.5 K/uL 41.7(H) 45.8(H) 57.6(HH)  Hemoglobin 12.0 - 15.0 g/dL 7.6(L) 6.5(LL) 7.1(L)  Hematocrit 36.0 - 46.0 % 25.2(L) 21.2(L) 23.7(L)  Platelets 150 - 400 K/uL 145(L) 183 205    ABG    Component Value Date/Time   PHART 7.482 (H) 02/25/2020 0335   PCO2ART 46.4 02/25/2020 0335   PO2ART 140 (H) 02/25/2020 0335   HCO3 34.5 (H) 02/25/2020 0335   TCO2 39 (H) 02/24/2020 1130   ACIDBASEDEF 17.0 (H) 02/23/2020 2358   O2SAT 99.4 02/25/2020 0335    CBG (last 3)  Recent Labs    02/27/20 2352 02/28/20 0346 02/28/20 0752  GLUCAP 163* 141* 135*    Critical care time: 32 minutes  Chesley Mires, MD [place] Pager - [phone] 02/28/2020, 10:41 AM

## 2020-02-29 DIAGNOSIS — Z515 Encounter for palliative care: Secondary | ICD-10-CM | POA: Diagnosis not present

## 2020-02-29 DIAGNOSIS — J189 Pneumonia, unspecified organism: Secondary | ICD-10-CM | POA: Diagnosis not present

## 2020-02-29 DIAGNOSIS — Z7189 Other specified counseling: Secondary | ICD-10-CM | POA: Diagnosis not present

## 2020-02-29 DIAGNOSIS — J9601 Acute respiratory failure with hypoxia: Secondary | ICD-10-CM | POA: Diagnosis not present

## 2020-02-29 DIAGNOSIS — N186 End stage renal disease: Secondary | ICD-10-CM | POA: Diagnosis not present

## 2020-02-29 LAB — CBC
HCT: 23.6 % — ABNORMAL LOW (ref 36.0–46.0)
Hemoglobin: 7.4 g/dL — ABNORMAL LOW (ref 12.0–15.0)
MCH: 32.9 pg (ref 26.0–34.0)
MCHC: 31.4 g/dL (ref 30.0–36.0)
MCV: 104.9 fL — ABNORMAL HIGH (ref 80.0–100.0)
Platelets: 129 10*3/uL — ABNORMAL LOW (ref 150–400)
RBC: 2.25 MIL/uL — ABNORMAL LOW (ref 3.87–5.11)
RDW: 21 % — ABNORMAL HIGH (ref 11.5–15.5)
WBC: 33.6 10*3/uL — ABNORMAL HIGH (ref 4.0–10.5)
nRBC: 1.8 % — ABNORMAL HIGH (ref 0.0–0.2)

## 2020-02-29 LAB — RENAL FUNCTION PANEL
Albumin: 1.7 g/dL — ABNORMAL LOW (ref 3.5–5.0)
Anion gap: 15 (ref 5–15)
BUN: 65 mg/dL — ABNORMAL HIGH (ref 8–23)
CO2: 23 mmol/L (ref 22–32)
Calcium: 8.9 mg/dL (ref 8.9–10.3)
Chloride: 98 mmol/L (ref 98–111)
Creatinine, Ser: 5.33 mg/dL — ABNORMAL HIGH (ref 0.44–1.00)
GFR, Estimated: 9 mL/min — ABNORMAL LOW (ref >60)
Glucose, Bld: 272 mg/dL — ABNORMAL HIGH (ref 70–99)
Phosphorus: 6.5 mg/dL — ABNORMAL HIGH (ref 2.5–4.6)
Potassium: 3.6 mmol/L (ref 3.5–5.1)
Sodium: 136 mmol/L (ref 135–145)

## 2020-02-29 LAB — CULTURE, BLOOD (ROUTINE X 2)
Culture: NO GROWTH
Culture: NO GROWTH

## 2020-02-29 LAB — GLUCOSE, CAPILLARY
Glucose-Capillary: 257 mg/dL — ABNORMAL HIGH (ref 70–99)
Glucose-Capillary: 195 mg/dL — ABNORMAL HIGH (ref 70–99)
Glucose-Capillary: 142 mg/dL — ABNORMAL HIGH (ref 70–99)
Glucose-Capillary: 135 mg/dL — ABNORMAL HIGH (ref 70–99)
Glucose-Capillary: 111 mg/dL — ABNORMAL HIGH (ref 70–99)
Glucose-Capillary: 86 mg/dL (ref 70–99)

## 2020-02-29 LAB — HEPARIN LEVEL (UNFRACTIONATED): Heparin Unfractionated: 0.27 IU/mL — ABNORMAL LOW (ref 0.30–0.70)

## 2020-02-29 LAB — MAGNESIUM: Magnesium: 2.2 mg/dL (ref 1.7–2.4)

## 2020-02-29 MED ORDER — VANCOMYCIN HCL IN DEXTROSE 750-5 MG/150ML-% IV SOLN
750.0000 mg | INTRAVENOUS | Status: DC
  Filled 2020-02-29: qty 150

## 2020-02-29 MED ORDER — APIXABAN 5 MG PO TABS
5.0000 mg | ORAL_TABLET | Freq: Two times a day (BID) | ORAL | Status: DC
  Administered 2020-02-29 – 2020-03-01 (×3): 5 mg
  Filled 2020-02-29 (×3): qty 1

## 2020-02-29 MED ORDER — HEPARIN SODIUM (PORCINE) 1000 UNIT/ML IJ SOLN
INTRAMUSCULAR | Status: AC
  Filled 2020-02-29: qty 3

## 2020-02-29 MED ORDER — INSULIN DETEMIR 100 UNIT/ML ~~LOC~~ SOLN
25.0000 [IU] | Freq: Two times a day (BID) | SUBCUTANEOUS | Status: DC
  Administered 2020-02-29 – 2020-03-01 (×3): 25 [IU] via SUBCUTANEOUS
  Filled 2020-02-29 (×4): qty 0.25

## 2020-02-29 NOTE — Progress Notes (Signed)
Patient ID: Rachel Chaney, female   DOB: 04-18-1960, 60 y.o.   MRN: [id number] [place] [facility] Progress Note   Assessment/ Plan:   1.  Status post PEA arrest: Secondary to healthcare associated pneumonia/sepsis preceding hospitalization.  Reintubated after suffering cardiac arrest earlier this week-etiology suspected to be possibly from PE and patient treated with tenecteplase and now on heparin drip. 2. ESRD: On a Monday/Wednesday/Friday dialysis schedule as an outpatient-underwent hemodialysis on Thursday (2/17) via left upper arm AV fistula without problems and on schedule for dialysis today after unable to be accommodated yesterday.  Cautious ultrafiltration in the setting of hypotension. 3. Anemia: Status post PRBC transfusion 2 days ago with stable hemoglobin/hematocrit.  No additional bruising noted around CVL sites. 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 5. Nutrition: Continue tube feeds via NGT and supplementations per RD recommendations. 6.  Acute hypoxic respiratory failure: Secondary to combination of Covid pneumonia and volume excess/pulmonary edema and now PE.  On schedule for dialysis today.  Subjective:   Without acute events noted overnight.  Unfortunately unable to get dialysis yesterday due to high patient volume/staffing ratio.   Objective:   BP (!) 123/46   Pulse 72   Temp 97.8 F (36.6 C) (Oral)   Resp (!) 22   Ht '5\' 4"'  (1.626 m)   Wt 99.2 kg   LMP 09/18/2011   SpO2 97%   BMI 37.54 kg/m   Physical Exam: Gen: Intubated, sleeping comfortably CVS: Pulse regular rhythm, normal rate, S1 and S2 normal Resp: Anteriorly clear to auscultation without rales/rhonchi.  Right subclavian line. Abd: Soft, obese, nontender, bowel sounds normal Ext: Left upper arm AV fistula pulsatile.  With 2+ pitting  upper extremity edema and nonpitting lower extremity edema.  Labs: BMET Recent Labs  Lab 02/25/20 1654 02/26/20 0533 02/26/20 2233 02/27/20 0354 02/27/20 1708 02/28/20 0350 02/29/20 0328  NA 140 140 139 137 139 136 136  K 3.0* 2.9* 3.1* 3.2* 3.1* 3.5 3.6  CL 93* 94* 96* 97* 99 99 98  CO2 32 '29 30 29 27 25 23  ' GLUCOSE 201* 138* 182* 192* 114* 155* 272*  BUN 55* 61* 33* 38* 44* 50* 65*  CREATININE 4.00* 4.72* 3.16* 3.41* 3.99* 4.34* 5.33*  CALCIUM 8.9 8.8* 8.5* 8.8* 8.7* 8.9 8.9  PHOS 5.2* 4.9* 4.0 4.6 4.6 5.2* 6.5*   CBC Recent Labs  Lab 02/26/20 0533 02/27/20 0354 02/28/20 0350 02/29/20 0328  WBC 57.6* 45.8* 41.7* 33.6*  HGB 7.1* 6.5* 7.6* 7.4*  HCT 23.7* 21.2* 25.2* 23.6*  MCV 108.2* 108.2* 105.0* 104.9*  PLT 205 183 145* 129*     Medications:    . aspirin  81 mg Per Tube Daily  . atorvastatin  80 mg Per Tube QHS  . B-complex with vitamin C  1 tablet Per Tube Daily  . chlorhexidine gluconate (MEDLINE KIT)  15 mL Mouth Rinse BID  . Chlorhexidine Gluconate Cloth  6 each Topical Q0600  . docusate  100 mg Per Tube BID  . famotidine  20 mg Per Tube Daily  . feeding supplement (PROSource TF)  45 mL Per Tube BID  . insulin aspart  0-20 Units Subcutaneous Q4H  . insulin aspart  4 Units Subcutaneous Q4H  . insulin detemir  20 Units Subcutaneous BID  . mouth rinse  15 mL Mouth Rinse 10 times per day  . midodrine  10 mg  Per Tube Q8H  . polyethylene glycol  17 g Per Tube Daily  . silver sulfADIAZINE   Topical BID  . sodium chloride flush  10-40 mL Intracatheter Q12H   Elmarie Shiley, MD 02/29/2020, 8:44 AM

## 2020-02-29 NOTE — Progress Notes (Signed)
Daily Progress Note   Patient Name: Rachel Chaney       Date: 02/29/2020 DOB: 1960/10/19  Age: 60 y.o. MRN#: [id number] Attending Physician: Chesley Mires, MD Primary Care Physician: Benay Pike, MD Admit Date: 02/13/2020  Reason for Consultation/Follow-up: Establishing goals of care  Subjective: Patient is on ventilator. Spoke with husband Gwenlyn Perking. He states he has been speaking with family and he hopes she will improve, but does not want her to suffer. He discusses that prior to this hospitalization, she was able to go places with him, and used a cane, and had an acceptable QOL.   He is concerned about her QOL and states he does not want her to have a tracheostomy. He states he wants to wait as long as possible until a decision would have to be made on a tracheostomy, and then he would like her extubated, and he would not want her reintubated. He would want to shift to a DNR status at the time she is extubated as he would not want chest compressions if she does not do well with extubation. He states he may want to change code status sooner depending on conversations with family.   He would like family to be able to see her prior to extubation in case she rapidly declines to a hospital death.      Length of Stay: 17  Current Medications: Scheduled Meds:  . apixaban  5 mg Per Tube BID  . aspirin  81 mg Per Tube Daily  . atorvastatin  80 mg Per Tube QHS  . B-complex with vitamin C  1 tablet Per Tube Daily  . chlorhexidine gluconate (MEDLINE KIT)  15 mL Mouth Rinse BID  . Chlorhexidine Gluconate Cloth  6 each Topical Q0600  . docusate  100 mg Per Tube BID  . famotidine  20 mg Per Tube Daily  . feeding supplement (PROSource TF)  45 mL Per Tube BID  . insulin aspart  0-20 Units  Subcutaneous Q4H  . insulin aspart  4 Units Subcutaneous Q4H  . insulin detemir  25 Units Subcutaneous BID  . mouth rinse  15 mL Mouth Rinse 10 times per day  . midodrine  10 mg Per Tube Q8H  . polyethylene glycol  17 g Per Tube Daily  . silver sulfADIAZINE   Topical  BID  . sodium chloride flush  10-40 mL Intracatheter Q12H    Continuous Infusions: . sodium chloride 250 mL (02/27/20 0502)  . dexmedetomidine (PRECEDEX) IV infusion 1 mcg/kg/hr (02/29/20 9191)  . feeding supplement (NEPRO CARB STEADY) 1,000 mL (02/28/20 2040)  . fentaNYL infusion INTRAVENOUS 200 mcg/hr (02/29/20 0600)  . piperacillin-tazobactam (ZOSYN)  IV 3.375 g (02/29/20 0605)  . vancomycin      PRN Meds: albuterol, alteplase, bisacodyl, dextrose, fentaNYL, lip balm, sodium chloride flush  Physical Exam Constitutional:      Comments: Eyes closed.   Pulmonary:     Comments: On ventilator.             Vital Signs: BP (!) 81/28   Pulse 66   Temp 97.8 F (36.6 C) (Oral)   Resp 11   Ht _0  (1.626 m)   Wt 99.2 kg   LMP 09/18/2011   SpO2 93%   BMI 37.54 kg/m  SpO2: SpO2: 93 % O2 Device: O2 Device: Ventilator O2 Flow Rate: O2 Flow Rate (L/min): 15 L/min  Intake/output summary:   Intake/Output Summary (Last 24 hours) at 02/29/2020 1033 Last data filed at 02/29/2020 0600 Gross per 24 hour  Intake 1646.73 ml  Output 430 ml  Net 1216.73 ml   LBM: Last BM Date: 02/28/20 Baseline Weight: Weight: 81.6 kg Most recent weight: Weight: 99.2 kg          Flowsheet Rows   Flowsheet Row Most Recent Value  Intake Tab   Referral Department Critical care  Unit at Time of Referral ICU  Palliative Care Primary Diagnosis Other (Comment)  Date Notified 02/15/20  Palliative Care Type New Palliative care  Reason for referral Clarify Goals of Care  Date of Admission 03/06/2020  Date first seen by Palliative Care 02/22/20  # of days Palliative referral response time 7 Day(s)  # of days IP prior to Palliative  referral 4  Clinical Assessment   Palliative Performance Scale Score 30%  Psychosocial & Spiritual Assessment   Palliative Care Outcomes   Patient/Family meeting held? Yes  Who was at the meeting? husband  Palliative Care Outcomes Clarified goals of care, Provided psychosocial or spiritual support, ACP counseling assistance      Patient Active Problem List   Diagnosis Date Noted  . Pressure injury of skin 02/24/2020  . Acute pulmonary edema (HCC)   . Palliative care by specialist   . Acute respiratory failure with hypoxia ([place]) 02/27/2020  . 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 Rash and nonspecific skin eruption 01/15/2020  . Hemorrhoids 11/25/2019  . Obstructive sleep apnea 11/12/2019  . Hypercapnic respiratory failure ([place]) 11/12/2019  . S/P transmetatarsal amputation of foot, right ([place]) 04/24/2018  . PAF (paroxysmal atrial fibrillation) ([place])   . Oxygen dependent   . Type 2 diabetes mellitus with diabetic neuropathy ([place])   . Severe protein-calorie malnutrition ([place])   . Seasonal allergies 04/12/2017  . Post-menopausal bleeding 12/07/2015  . ESRD (end stage renal disease) ([place]) 05/25/2015  . Secondary hyperparathyroidism of renal origin ([place]) 04/21/2015  . Hypoalbuminemia   . Goals of care, counseling/discussion 03/02/2014  . Leukocytosis 05/17/2013  . GERD (gastroesophageal reflux disease) 04/27/2010  . Hyperlipidemia 03/08/2006  . Morbid obesity ([place]) 03/08/2006  . Essential hypertension with morbid obesity 03/08/2006    Palliative Care Assessment & Plan   Assessment/Recommendations/Plan:  Wait as long as possible, then 1 way extubation. Code status to DNR at the time of extubation.    He would like family to be able  to see her prior to extubation in case she rapidly declines to a hospital death.     Code Status:    Code Status Orders  (From admission, onward)         Start     Ordered   02/24/2020 2332  Full code  Continuous        03/08/2020 2332        Code  Status History    Date Active Date Inactive Code Status Order ID Comments User Context   01/31/2020 0720 02/01/2020 2341 Full Code [id number]  Delora Fuel, MD ED   12/16/2019 0853 12/18/2019 2046 Full Code [id number]  Carollee Leitz, MD ED   11/12/2019 0937 11/18/2019 0036 Full Code [id number]  Gleason, Otilio Carpen, PA-C ED   11/11/2019 2259 11/12/2019 0936 Full Code [id number]  Daisy Floro, DO ED   04/24/2018 1532 04/26/2018 1944 Full Code [id number]  [name], MD Inpatient   02/25/2018 1805 03/14/2018 2312 Full Code [id number]  Daisy Floro, DO Inpatient   04/12/2015 0238 04/19/2015 2252 Full Code [id number]  Norval Morton, MD ED   04/12/2015 0238 04/12/2015 0238 Full Code [id number]  Norval Morton, MD ED   12/14/2014 0204 12/18/2014 1956 Full Code [id number]  Rosemarie Ax, MD Inpatient   Advance Care Planning Activity       Prognosis:  Poor overall    Care plan was discussed with Dr. Bennie Hind.   Thank you for allowing the Palliative Medicine Team to assist in the care of this patient.   Total Time 35 min Prolonged Time Billed  no       Greater than 50%  of this time was spent counseling and coordinating care related to the above assessment and plan.  Asencion Gowda, NP  Please contact Palliative Medicine Team phone at [phone] for questions and concerns.

## 2020-02-29 NOTE — Progress Notes (Addendum)
NAMEGLENDINE Chaney, MRN:  [id number], DOB:  [birth year], LOS: 70 ADMISSION DATE:  02/10/2020, CONSULTATION DATE:  03/03/2020 REFERRING MD:  FMTS, CHIEF COMPLAINT:  PEA arrest    Brief History:  Rachel Chaney is a 60 y.o. female with a pPMH of ESRD on HD (MWF), Chronic hypoxic respiratory failure, T2DM, HFpEF, HTN, HLD, CAD s/p MI (2020) who presented to [facility] with chronic nonspecific skin pain over her bilateral lower extremities and her hospitalization has been complicated by acute on chronic hypoxic respiratory failure and hypotension.   Patient had PEA cardiac arrest during dialysis on 2/4, she was transferred to ICU. She was covid positive prior to this admit on 01/31/20  Past Medical History:  ESRD, Type II diabetes, HLD, HTN, Cardiac arrest, CHF  Significant Hospital Events:  02/02 - Admitted for leg pain found hypoxic and hypotensive.  02/04 - PEA arrest xxxxxxx 2/11 - Precedex weaned off this morning Remains on insulin drip but likely can be transitioned levo 3. 0.50, currently on pressure support ventilation. Scheduled for hemodialysis later today 2/12 - remains extubated  since yesterday.  Overnight  Needing pressors.  Ongoping challenges with measuring BP -> being measured RLE. On [place] -o2 -following some commands. Deconditioned and weak. Difffuse pain per RN 2/14: Patient became unresponsive, not hypoglycemic, hypotensive and hypoxic. Reintubated due to change in her mental status and hypoxia. Bedside echocardiogram was done and showed RV dilatation and hypokinesis, new compared to the echocardiogram which was done 3 weeks ago.  With sudden change in hemodynamics, decision was made to give [facility] and then patient was started on heparin infusion  2/17 transitioned to [facility] 2//18 bleeding from CVL site requiring PRBC transfusion; resolved after placing extra suture 2/19 off levophed  Consults:  Nephrology  Cardiology  Procedures:  ETT 2/4> 2/11 ETT 2/14 > Right subclavian HD 2/15  > Left IJ CVC 2/15 > Right axillary art line 2/15 >  Significant Diagnostic Tests:   01/23 Echo > 60 to 65%  02/02 CT Abdomen/Pelvis > No acute abnormality. Multiple chronic findings as detailed above, not substantially changed from recent prior study.  02/02 CT left and right femur > No acute osseous abnormality. There is nonspecific lower extremity edema without evidence for an abscess. Findings are nonspecific but can be seen in patients with cellulitis in the appropriate clinical setting. Other differential considerations include chronic venous insufficiency given the presence of subcutaneous calcifications.  2/16 BL PVL > No evidence of deep vein thrombosis in bilateral lower extremities, though study is limited by body habitus, bandages, and poor ultrasound/tissue interface due to skin changes.   2/17 Echo > Hyperdynamic LV function EF >75%, RV appears similar to prior. There is a left pleural effusion visualized but only a trivial pericardial effusion, no tamponade noted.   Micro Data:  01/31/20 - covid positive xxxx MRSA PCR 2/4 > neg Blood culture 2/4 > neg Respiratory 2/4 > neg Respiratory culture 2/15 > Candida albicans and tropicalis C. difficile 2/15 >> negative Blood 2/15 >>   Antimicrobials:  Vancomycin 2/14 >> 2/20 Cefepime 2/14 Zosyn 2/15 >> 2/20   Interim History / Subjective:  Pressure support.  For HD today.  Objective   Blood pressure (!) 123/46, pulse 72, temperature 97.8 F (36.6 C), temperature source Oral, resp. rate (!) 22, height 5\' 4"  (1.626 m), weight 99.2 kg, last menstrual period 09/18/2011, SpO2 97 %.    Vent Mode: PCV FiO2 (%):  [30 %-50 %] 40 % Set Rate:  [20  bmp] 20 bmp PEEP:  [5 cmH20-25 cmH20] 5 cmH20 Plateau Pressure:  [18 WUX32-44 cmH20] 18 cmH20   Intake/Output Summary (Last 24 hours) at 02/29/2020 0906 Last data filed at 02/29/2020 0600 Gross per 24 hour  Intake 1697.25 ml  Output 430 ml  Net 1267.25 ml   Filed Weights    02/26/20 1745 02/28/20 0500 02/29/20 0359  Weight: 93.7 kg 97.1 kg 99.2 kg     General - sedated Eyes - pupils reactive ENT - ETT in place Cardiac - regular rate/rhythm, no murmur Chest - equal breath sounds b/l, no wheezing or rales Abdomen - soft, non tender, + bowel sounds Extremities - 1+ edema Skin - no rashes Neuro - RASS -3  Resolved Hospital Problem list   Diarrhea, Septic shock from HCAP, Metabolic acidosis with lactic acidosis, PEA cardiac arrest  Assessment & Plan:   Acute on chronic hypoxic/hypercapnic respiratory failure from HCAP after recent COVID 19 infection. Hx of OSA/OHS. - pressure support wean as tolerated - day 7/7 of ABx - might need trach to assist with vent weaning - f/u CXR  ESRD on HD MWF. - for iHD on 2/20  Presumed PE with Rt heart strain causing hemodynamic collapse on 2/14. - treated with thrombolytic therapy - transition heparin gtt to DOAC   Hx of chronic hypotension, HLD. - continue ASA, lipitor, midodrine  Acute metabolic encephalopathy from hypoxia, sepsis. - RASS goal 0 to -1  Anemia of critical illness and chronic disease. - f/u CBC - transfuse for Hb < 7 or significant bleeding  DM type 2 poorly controlled with hyperglycemia. - SSI with tube feed coverage - increase levemir to 25 units bid  Best practice (evaluated daily)  Diet: tube feeds DVT prophylaxis: heparin gtt GI prophylaxis: pepcid Mobility: Bedrest Disposition: ICU Family communication: husband update on 2/18 Code Status: Full  LABS   CMP Latest Ref Rng & Units 02/29/2020 02/28/2020 02/27/2020  Glucose 70 - 99 mg/dL 272(H) 155(H) 114(H)  BUN 8 - 23 mg/dL 65(H) 50(H) 44(H)  Creatinine 0.44 - 1.00 mg/dL 5.33(H) 4.34(H) 3.99(H)  Sodium 135 - 145 mmol/L 136 136 139  Potassium 3.5 - 5.1 mmol/L 3.6 3.5 3.1(L)  Chloride 98 - 111 mmol/L 98 99 99  CO2 22 - 32 mmol/L 23 25 27   Calcium 8.9 - 10.3 mg/dL 8.9 8.9 8.7(L)  Total Protein 6.5 - 8.1 g/dL - - -  Total  Bilirubin 0.3 - 1.2 mg/dL - - -  Alkaline Phos 38 - 126 U/L - - -  AST 15 - 41 U/L - - -  ALT 0 - 44 U/L - - -    CBC Latest Ref Rng & Units 02/29/2020 02/28/2020 02/27/2020  WBC 4.0 - 10.5 K/uL 33.6(H) 41.7(H) 45.8(H)  Hemoglobin 12.0 - 15.0 g/dL 7.4(L) 7.6(L) 6.5(LL)  Hematocrit 36.0 - 46.0 % 23.6(L) 25.2(L) 21.2(L)  Platelets 150 - 400 K/uL 129(L) 145(L) 183    ABG    Component Value Date/Time   PHART 7.482 (H) 02/25/2020 0335   PCO2ART 46.4 02/25/2020 0335   PO2ART 140 (H) 02/25/2020 0335   HCO3 34.5 (H) 02/25/2020 0335   TCO2 39 (H) 02/24/2020 1130   ACIDBASEDEF 17.0 (H) 02/23/2020 2358   O2SAT 99.4 02/25/2020 0335    CBG (last 3)  Recent Labs    02/28/20 2330 02/29/20 0326 02/29/20 0745  GLUCAP 233* 257* 195*    Critical care time: 31 minutes  Chesley Mires, MD [place] Pager - [phone] 02/29/2020,  9:06 AM

## 2020-02-29 NOTE — Progress Notes (Signed)
ANTICOAGULATION CONSULT NOTE - Follow Up Consult  Pharmacy Consult for heparin Indication: presumed PE  Labs: Recent Labs    02/27/20 0354 02/27/20 1259 02/27/20 1708 02/27/20 2146 02/28/20 0350 02/29/20 0328 02/29/20 0329  HGB 6.5*  --   --   --  7.6* 7.4*  --   HCT 21.2*  --   --   --  25.2* 23.6*  --   PLT 183  --   --   --  145* 129*  --   HEPARINUNFRC 0.27*   < >  --  0.44 0.40  --  0.27*  CREATININE 3.41*  --  3.99*  --  4.34*  --   --    < > = values in this interval not displayed.    Assessment: 59yo female subtherapeutic on heparin after two levels at goal.  Goal of Therapy:  Heparin level 0.3-0.7 units/ml   Plan:  Will increase heparin gtt by 2 units/kg/hr to 1700 units/hr and check level in 8 hours.    Wynona Neat, PharmD, BCPS  02/29/2020,4:40 AM

## 2020-02-29 NOTE — Progress Notes (Signed)
[place] for Apixaban Indication: Suspected pulmonary emoblism post-thrombolytics  No Known Allergies  Patient Measurements: Height: 5\' 4"  (162.6 cm) Weight: 99.2 kg (218 lb 11.1 oz) IBW/kg (Calculated) : 54.7 Heparin Dosing Weight: 74.6 kg  Vital Signs: Temp: 97.8 F (36.6 C) (02/20 0406) Temp Source: Oral (02/20 0406) BP: 123/46 (02/20 0727) Pulse Rate: 72 (02/20 0727)  Labs: Recent Labs    02/27/20 0354 02/27/20 1259 02/27/20 1708 02/27/20 2146 02/28/20 0350 02/29/20 0328 02/29/20 0329  HGB 6.5*  --   --   --  7.6* 7.4*  --   HCT 21.2*  --   --   --  25.2* 23.6*  --   PLT 183  --   --   --  145* 129*  --   HEPARINUNFRC 0.27*   < >  --  0.44 0.40  --  0.27*  CREATININE 3.41*  --  3.99*  --  4.34* 5.33*  --    < > = values in this interval not displayed.    Estimated Creatinine Clearance: 12.7 mL/min (A) (by C-G formula based on SCr of 5.33 mg/dL (H)).  Assessment: 60 year old female with suspected pulmonary embolism status post thrombolytics (IV Tenecteplase) on 2/14. IV Heparin was initiated post lytics and has been on for 7 days. Now plan to change to Apixaban therapy. Cortrak in place and location in stomach. Since patient has had 7 days of IV Heparin and lytics - will not load with increased dose for 7 days (discussed and confirmed plan with Dr. Halford Chessman).   Hgb 7.4 - low stable. Platelets 129 - trending down, will monitor.  No overt bleeding noted.   Goal of Therapy:  Monitor platelets by anticoagulation protocol: Yes   Plan:  Discontinue IV Heparin when first dose of Apixaban is given.  Apixaban 5 mg per tube BID.  Monitor daily CBC, s/sx bleeding Watch Platelet trend closely  Sloan Leiter, PharmD, BCPS, BCCCP Clinical Pharmacist Please refer to [facility] for [place] numbers 02/29/2020 9:25 AM

## 2020-03-01 ENCOUNTER — Inpatient Hospital Stay (HOSPITAL_COMMUNITY): Payer: Medicare Other

## 2020-03-01 DIAGNOSIS — J9622 Acute and chronic respiratory failure with hypercapnia: Secondary | ICD-10-CM | POA: Diagnosis not present

## 2020-03-01 DIAGNOSIS — J9621 Acute and chronic respiratory failure with hypoxia: Secondary | ICD-10-CM | POA: Diagnosis not present

## 2020-03-01 LAB — CBC
HCT: 25 % — ABNORMAL LOW (ref 36.0–46.0)
Hemoglobin: 7.6 g/dL — ABNORMAL LOW (ref 12.0–15.0)
MCH: 32.8 pg (ref 26.0–34.0)
MCHC: 30.4 g/dL (ref 30.0–36.0)
MCV: 107.8 fL — ABNORMAL HIGH (ref 80.0–100.0)
Platelets: 136 10*3/uL — ABNORMAL LOW (ref 150–400)
RBC: 2.32 MIL/uL — ABNORMAL LOW (ref 3.87–5.11)
RDW: 21.4 % — ABNORMAL HIGH (ref 11.5–15.5)
WBC: 35.4 10*3/uL — ABNORMAL HIGH (ref 4.0–10.5)
nRBC: 3.2 % — ABNORMAL HIGH (ref 0.0–0.2)

## 2020-03-01 LAB — RENAL FUNCTION PANEL
Albumin: 1.6 g/dL — ABNORMAL LOW (ref 3.5–5.0)
Anion gap: 13 (ref 5–15)
BUN: 48 mg/dL — ABNORMAL HIGH (ref 8–23)
CO2: 23 mmol/L (ref 22–32)
Calcium: 8.9 mg/dL (ref 8.9–10.3)
Chloride: 101 mmol/L (ref 98–111)
Creatinine, Ser: 4.07 mg/dL — ABNORMAL HIGH (ref 0.44–1.00)
GFR, Estimated: 12 mL/min — ABNORMAL LOW (ref >60)
Glucose, Bld: 125 mg/dL — ABNORMAL HIGH (ref 70–99)
Phosphorus: 5.9 mg/dL — ABNORMAL HIGH (ref 2.5–4.6)
Potassium: 4 mmol/L (ref 3.5–5.1)
Sodium: 137 mmol/L (ref 135–145)

## 2020-03-01 LAB — HEPATITIS B SURFACE ANTIGEN: Hepatitis B Surface Ag: NONREACTIVE

## 2020-03-01 LAB — GLUCOSE, CAPILLARY
Glucose-Capillary: 116 mg/dL — ABNORMAL HIGH (ref 70–99)
Glucose-Capillary: 161 mg/dL — ABNORMAL HIGH (ref 70–99)

## 2020-03-01 LAB — MAGNESIUM: Magnesium: 2.2 mg/dL (ref 1.7–2.4)

## 2020-03-01 MED ORDER — MIDAZOLAM HCL 2 MG/2ML IJ SOLN
INTRAMUSCULAR | Status: AC
  Filled 2020-03-01: qty 2

## 2020-03-01 MED ORDER — ATROPINE SULFATE 1 MG/10ML IJ SOSY
1.0000 mg | PREFILLED_SYRINGE | Freq: Once | INTRAMUSCULAR | Status: AC
  Administered 2020-03-01: 1 mg via INTRAVENOUS

## 2020-03-01 MED ORDER — NOREPINEPHRINE 4 MG/250ML-% IV SOLN
0.0000 ug/min | INTRAVENOUS | Status: DC
  Administered 2020-03-01: 6 ug/min via INTRAVENOUS

## 2020-03-01 MED ORDER — MIDAZOLAM HCL 2 MG/2ML IJ SOLN
2.0000 mg | INTRAMUSCULAR | Status: DC | PRN
  Administered 2020-03-01: 2 mg via INTRAVENOUS
  Filled 2020-03-01: qty 2

## 2020-03-01 MED ORDER — ACETAMINOPHEN 325 MG PO TABS: 650.0000 mg | ORAL_TABLET | Freq: Four times a day (QID) | ORAL | Status: DC | PRN

## 2020-03-01 MED ORDER — SODIUM CHLORIDE 0.9 % IV SOLN: 250.0000 mL | INTRAVENOUS | Status: DC

## 2020-03-01 MED ORDER — MORPHINE 100MG IN NS 100ML (1MG/ML) PREMIX INFUSION
0.0000 mg/h | INTRAVENOUS | Status: DC
  Administered 2020-03-01: 5 mg/h via INTRAVENOUS
  Filled 2020-03-01: qty 100

## 2020-03-01 MED ORDER — MIDAZOLAM HCL 2 MG/2ML IJ SOLN
2.0000 mg | Freq: Once | INTRAMUSCULAR | Status: AC
  Administered 2020-03-01: 2 mg via INTRAVENOUS

## 2020-03-01 MED ORDER — DIPHENHYDRAMINE HCL 50 MG/ML IJ SOLN: 25.0000 mg | INTRAMUSCULAR | Status: DC | PRN

## 2020-03-01 MED ORDER — GLYCOPYRROLATE 0.2 MG/ML IJ SOLN: 0.1000 mg | INTRAMUSCULAR | Status: DC

## 2020-03-01 MED ORDER — ACETAMINOPHEN 650 MG RE SUPP: 650.0000 mg | Freq: Four times a day (QID) | RECTAL | Status: DC | PRN

## 2020-03-01 MED ORDER — POLYVINYL ALCOHOL 1.4 % OP SOLN
1.0000 [drp] | Freq: Four times a day (QID) | OPHTHALMIC | Status: DC | PRN
  Filled 2020-03-01: qty 15

## 2020-03-01 MED ORDER — ATROPINE SULFATE 1 MG/10ML IJ SOSY
PREFILLED_SYRINGE | INTRAMUSCULAR | Status: AC
  Filled 2020-03-01: qty 10

## 2020-03-01 MED ORDER — MIDODRINE HCL 5 MG PO TABS: 20.0000 mg | ORAL_TABLET | Freq: Three times a day (TID) | ORAL | Status: DC

## 2020-03-01 MED ORDER — MORPHINE SULFATE (PF) 2 MG/ML IV SOLN
2.0000 mg | INTRAVENOUS | Status: DC | PRN
  Administered 2020-03-01 (×2): 2 mg via INTRAVENOUS

## 2020-03-01 MED ORDER — MORPHINE BOLUS VIA INFUSION
5.0000 mg | INTRAVENOUS | Status: DC | PRN
  Filled 2020-03-01: qty 5

## 2020-03-02 LAB — HEPATITIS B SURFACE ANTIBODY, QUANTITATIVE: Hep B S AB Quant (Post): <3.1 m[IU]/mL — ABNORMAL LOW (ref >9.9)

## 2020-03-04 ENCOUNTER — Encounter: Payer: Medicare Other | Admitting: Medical

## 2020-03-05 ENCOUNTER — Encounter: Payer: Medicare Other | Admitting: Obstetrics and Gynecology

## 2020-03-09 NOTE — Procedures (Signed)
Extubation Procedure Note  Patient Details:   Name: Rachel Chaney DOB: [birth year] MRN: [id number]   Airway Documentation:    Vent end date: 03/06/2020 Vent end time: 1419   Evaluation Pt extubated per Withdrawal of Life protocol.   Laymond Purser M Mar 06, 2020, 2:21 PM

## 2020-03-09 NOTE — Progress Notes (Signed)
Patient ID: Early Chars, female   DOB: 14-Sep-1960, 60 y.o.   MRN: [id number] [facility] Progress Note   Assessment/ Plan:   1.  Status post PEA arrest: Secondary to healthcare associated pneumonia/sepsis preceding hospitalization.  Reintubated after suffering cardiac arrest earlier this week. Etiology suspected to be possibly from PE and patient treated with tenecteplase and now on heparin drip. 2. ESRD: usual HD is MWF using left upper arm AV fistula. Cautious ultrafiltration in the setting of hypotension. Had HD yesterday 2/20 on Sunday off schedule, will plan next HD tomorrow.  3. Anemia: Status post PRBC transfusion 2 days ago with stable hemoglobin/hematocrit.  No additional bruising noted around CVL sites. 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 5. Nutrition: Continue tube feeds via NGT and supplementations per RD recommendations. 6.  Acute hypoxic respiratory failure: Secondary to combination of Covid pneumonia and volume excess/pulmonary edema and then acute PE.   Kelly Splinter, MD 03/22/20, 12:10 PM     Subjective:   Had HD yest w/ < 1L UF due to low BP's on HD. Pt is DNR now.    Objective:   BP (!) 157/57   Pulse 77   Temp 98.6 F (37 C)   Resp 20   Ht _0  (1.626 m)   Wt 99.8 kg   LMP 09/18/2011   SpO2 100%   BMI 37.77 kg/m   Physical Exam: Gen: Intubated, sleeping comfortably CVS: Pulse regular rhythm, normal rate, S1 and S2 normal Resp: Anteriorly clear to auscultation without rales/rhonchi.  Right subclavian line. Abd: Soft, obese, nontender, bowel sounds normal Ext: Left upper arm AV fistula pulsatile.  With 2+ pitting upper extremity edema and nonpitting lower extremity edema.   OP HD: [place] [place]   3.5h  450/800  97kg  2/2 bath  P4  L AVF  Hep 8000 - Sensipar 30 mg PO q HD -  Hectoral 73mg IV q HD - Mircera 100 q 2 weeks - ordered, not yet given, Hgb 10.8 with tsat 9 on 01/28/20   Labs: BMET Recent Labs  Lab 02/26/20 0533 02/26/20 2233 02/27/20 0354 02/27/20 1708 02/28/20 0350 02/29/20 0328 [id number]  NA 140 139 137 139 136 136 137  K 2.9* 3.1* 3.2* 3.1* 3.5 3.6 4.0  CL 94* 96* 97* 99 99 98 101  CO2 _1 GLUCOSE 138* 182* 192* 114* 155* 272* 125*  BUN 61* 33* 38* 44* 50* 65* 48*  CREATININE 4.72* 3.16* 3.41* 3.99* 4.34* 5.33* 4.07*  CALCIUM 8.8* 8.5* 8.8* 8.7* 8.9 8.9 8.9  PHOS 4.9* 4.0 4.6 4.6 5.2* 6.5* 5.9*   CBC Recent Labs  Lab 02/27/20 0354 02/28/20 0350 02/29/20 0328 0March 14, [id number]  WBC 45.8* 41.7* 33.6* 35.4*  HGB 6.5* 7.6* 7.4* 7.6*  HCT 21.2* 25.2* 23.6* 25.0*  MCV 108.2* 105.0* 104.9* 107.8*  PLT 183 145* 129* 136*     Medications:    . atropine      . chlorhexidine gluconate (MEDLINE KIT)  15 mL Mouth Rinse BID  . Chlorhexidine Gluconate Cloth  6 each Topical Q0600  . glycopyrrolate  0.1 mg Intravenous Q4H  . mouth rinse  15 mL Mouth Rinse 10 times per day  . midazolam       JElmarie Shiley MD 203/14/2022 12:09 PM

## 2020-03-09 NOTE — Progress Notes (Signed)
Patient passed with her daughter and husband at bedside. CCM notified. Modena Morrow E, RN 03/16/2020 .[phone]

## 2020-03-09 NOTE — Death Summary Note (Signed)
DEATH SUMMARY   Patient Details  Name: Rachel Chaney MRN: [id number] DOB: 18-Dec-1960  Admission/Discharge Information   Admit Date:  09-Mar-2020  Date of Death: Date of Death: 03-28-2020  Time of Death: Time of Death: 04-28-42  Length of Stay: 04-23-2022  Referring Physician: Benay Pike, MD   Reason(s) for Hospitalization  skin pain  Diagnoses  Preliminary cause of death:  Secondary Diagnoses (including complications and co-morbidities):  Active Problems:   Goals of care, counseling/discussion   ESRD (end stage renal disease) ([place])   Acute respiratory failure with hypoxia (HCC)   Acute pulmonary edema (HCC)   Palliative care by specialist   Pressure injury of skin   Brief Hospital Course (including significant findings, care, treatment, and services provided and events leading to death)  Rachel Chaney is a 60 y.o. year old female who per H&P from 2/2 presented "for increased pain related to her skin lesions. In the ED she was initially stable on room air, but later noted to be hypoxic to 76%. Of note, patient uses 3L O2 at home but did not bring it with her today. She tested positive for COVID-19 on 01/31/2020 and received remdesevir x2 during prior admission. CXR today shows patchy bilateral infiltrates c/w multifocal PNA likely due to recent COVID infection. ABG was obtained in the ED and showed pH 7.345, pO2 64, pCO2 59, bicarb 32.2. Etiology of her hypoxemia likely multifactorial secondary to noncompliance with home O2 and COVID sequela infection.  Patient currently stable on 4L [place] with no respiratory distress.  ECHO from 02/01/20 with EF 60-65% and mildly elevated PA systolic pressure.  No signs of volume overload on exam and pt has not missed HD making heart failure less likely. Wells score is 0 therefore very little suspicion for PE."   Of note I have provided care for the patient for 6 hours only during her 19 day hospitalization 4 of those hours being that of transition to  comfort care.   Please see chart for complete details, however upon review it appears ccm was consulted on 2/4 after pt suffered cardiac arrest in dialysis. She was emergently intubated, started on vasopressors and pancx with concern for sepsis, started on broad abx. Pt was able to be extubated on 2/11 but remained high risk for intubation or deterioration.   On 2/14 pt became agitated and restless/confused. She was found to be hypoglycemic and hypoxic and hypotensive. Bedside echo revealed rhs so tnk was given at beside. She was started again on vasopressors. Later in the evening she became unresponsive and was ultimately intubated. cth ordered for acute change in ms.   Ultimately pt failed to improved she has remained relatively poorly responsive, on pressors and req vent.   In the morning of 03-28-22 she had period of oxygenation in the 30's, hypotension to abp 60's and bradycardia to 40's. Levophed was increased and atropine given. Husband at bedside and requested that transition to comfort care. Family was called in, pt was compassionately extubated and life sustaining medications stopped.   Pt expired peacefully with family by her side.    Pertinent Labs and Studies  Significant Diagnostic Studies DG Chest 2 View  Result Date: 2020/03/09 CLINICAL DATA:  Cough leukocytosis EXAM: CHEST - 2 VIEW COMPARISON:  02/05/2020 FINDINGS: Enlargement of cardiac silhouette. Clothing artifacts project over chest. Mediastinal contours and pulmonary vascularity normal. Atherosclerotic calcification aorta. Patchy BILATERAL pulmonary infiltrates which could represent multifocal pneumonia or less likely pulmonary edema. Slight improvement in aeration  in the RIGHT mid lung and LEFT upper lobe since prior study. No pleural effusion or pneumothorax. Osseous structures unremarkable. IMPRESSION: Patchy BILATERAL pulmonary infiltrates favor multifocal pneumonia over pulmonary edema. Electronically Signed   By: Lavonia Chaney  M.D.   On: 02/28/2020 17:59   DG Chest 2 View  Result Date: 02/05/2020 CLINICAL DATA:  Shortness of breath No bilateral lower extremity swelling and pain EXAM: CHEST - 2 VIEW COMPARISON:  CT chest 01/31/2020 Chest radiograph 12/16/2019 FINDINGS: Unchanged mild cardiomegaly. Mild pulmonary vascular congestion again seen. Interval worsening of bilateral airspace opacities. IMPRESSION: Multifocal bilateral airspace opacities may be due to pulmonary edema or pneumonia. Electronically Signed   By: Rachel Chaney M.D.   On: 02/05/2020 11:18   DG Abd 1 View  Result Date: 02/13/2020 CLINICAL DATA:  60 year old female with enteric tube placement. EXAM: ABDOMEN - 1 VIEW COMPARISON:  CT abdomen pelvis dated 02/29/2020. FINDINGS: Partially visualized enteric tube with tip and side-port in the proximal stomach. No bowel dilatation. Degenerative changes of the spine. The osseous structures are grossly unremarkable. Atherosclerotic calcification of the aorta. IMPRESSION: Enteric tube with tip and side-port in the proximal stomach. Electronically Signed   By: Rachel Chaney M.D.   On: 02/13/2020 16:09   CT HEAD WO CONTRAST  Result Date: 02/24/2020 CLINICAL DATA:  Mental status change with cause EXAM: CT HEAD WITHOUT CONTRAST TECHNIQUE: Contiguous axial images were obtained from the base of the skull through the vertex without intravenous contrast. COMPARISON:  Brain MRI 11/14/2019 FINDINGS: Brain: No evidence of acute infarction, hemorrhage, hydrocephalus, extra-axial collection or mass lesion/mass effect. Generalized cortical atrophy. Chronic small vessel ischemia in the deep cerebral white matter. Chronic lacune at the right thalamus. Vascular: No hyperdense vessel.  Atheromatous calcification. Skull: Normal. Negative for fracture or focal lesion. Sinuses/Orbits: Partial bilateral mastoid opacification. This may be related to intubation. Bilateral cataract resection IMPRESSION: No acute or reversible finding.  Electronically Signed   By: Rachel Chaney M.D.   On: 02/24/2020 09:01   CT ABDOMEN PELVIS W CONTRAST  Result Date: 03/02/2020 CLINICAL DATA:  Abdominal pain.  Abscess. EXAM: CT ABDOMEN AND PELVIS WITH CONTRAST TECHNIQUE: Multidetector CT imaging of the abdomen and pelvis was performed using the standard protocol following bolus administration of intravenous contrast. CONTRAST:  165mL OMNIPAQUE IOHEXOL 300 MG/ML  SOLN COMPARISON:  January 31, 2020 FINDINGS: Lower chest: There is atelectasis and scarring at the lung bases, similar to prior study.The heart is enlarged. Hepatobiliary: There is probable hepatic steatosis. There is gallbladder sludge without CT evidence for acute cholecystitis.There is no biliary ductal dilation. Pancreas: Normal contours without ductal dilatation. No peripancreatic fluid collection. Spleen: Unremarkable. Adrenals/Urinary Tract: --Adrenal glands: Unremarkable. --Right kidney/ureter: The right kidney is atrophic. --Left kidney/ureter: The left kidney is atrophic. --Urinary bladder: Bladder is decompressed which limits evaluation. Stomach/Bowel: --Stomach/Duodenum: No hiatal hernia or other gastric abnormality. Normal duodenal course and caliber. --Small bowel: Unremarkable. --Colon: There is scattered colonic diverticula without CT evidence for diverticulitis. --Appendix: The appendix is dilated but air-filled and does not demonstrate surrounding inflammatory changes. Vascular/Lymphatic: Atherosclerotic calcification is present within the non-aneurysmal abdominal aorta, without hemodynamically significant stenosis. --No retroperitoneal lymphadenopathy. --No mesenteric lymphadenopathy. --No pelvic or inguinal lymphadenopathy. Reproductive: Unremarkable Other: There is diffuse subcutaneous fat stranding involving the posterior and anterior abdominal walls. This is essentially stable across prior studies. The abdominal wall is normal. Musculoskeletal. No acute displaced fractures.  IMPRESSION: 1. No acute abnormality. 2. Multiple chronic findings as detailed above, not substantially changed from recent  prior study. Electronically Signed   By: Constance Holster M.D.   On: 03/05/2020 19:33   CT FEMUR LEFT W CONTRAST  Result Date: 02/20/2020 CLINICAL DATA:  Soft tissue mass of the thigh. Concern for cellulitis. EXAM: CT OF THE LOWER BILATERAL EXTREMITY WITH CONTRAST TECHNIQUE: Multidetector CT imaging of the lower bilateral extremities was performed according to the standard protocol following intravenous contrast administration. CONTRAST:  115mL OMNIPAQUE IOHEXOL 300 MG/ML  SOLN COMPARISON:  None. FINDINGS: Evaluation of the patient's pelvis does not demonstrate an acute abnormality. There is no acute osseous abnormality involving the visualized osseous structures. There are advanced vascular calcifications bilaterally. There is nonspecific lower extremity soft tissue edema with multiple associated subcutaneous calcifications. There is no abscess. No drainable fluid collection. There is mild overlying skin thickening. Lower extremity muscular atrophy is noted. There are no pockets subcutaneous gas. There are mild-to-moderate degenerative changes of both hips. IMPRESSION: 1. No acute osseous abnormality. 2. There is nonspecific lower extremity edema without evidence for an abscess. Findings are nonspecific but can be seen in patients with cellulitis in the appropriate clinical setting. Other differential considerations include chronic venous insufficiency given the presence of subcutaneous calcifications. 3. No radiographic evidence for osteomyelitis. 4.  Aortic Atherosclerosis (ICD10-I70.0). Electronically Signed   By: Constance Holster M.D.   On: 02/18/2020 19:39   CT FEMUR RIGHT W CONTRAST  Result Date: 02/22/2020 CLINICAL DATA:  Soft tissue mass of the thigh. Concern for cellulitis. EXAM: CT OF THE LOWER BILATERAL EXTREMITY WITH CONTRAST TECHNIQUE: Multidetector CT imaging of the  lower bilateral extremities was performed according to the standard protocol following intravenous contrast administration. CONTRAST:  125mL OMNIPAQUE IOHEXOL 300 MG/ML  SOLN COMPARISON:  None. FINDINGS: Evaluation of the patient's pelvis does not demonstrate an acute abnormality. There is no acute osseous abnormality involving the visualized osseous structures. There are advanced vascular calcifications bilaterally. There is nonspecific lower extremity soft tissue edema with multiple associated subcutaneous calcifications. There is no abscess. No drainable fluid collection. There is mild overlying skin thickening. Lower extremity muscular atrophy is noted. There are no pockets subcutaneous gas. There are mild-to-moderate degenerative changes of both hips. IMPRESSION: 1. No acute osseous abnormality. 2. There is nonspecific lower extremity edema without evidence for an abscess. Findings are nonspecific but can be seen in patients with cellulitis in the appropriate clinical setting. Other differential considerations include chronic venous insufficiency given the presence of subcutaneous calcifications. 3. No radiographic evidence for osteomyelitis. 4.  Aortic Atherosclerosis (ICD10-I70.0). Electronically Signed   By: Constance Holster M.D.   On: 02/25/2020 19:39   DG Chest Port 1 View  Result Date: 03/27/2020 CLINICAL DATA:  Respiratory failure EXAM: PORTABLE CHEST 1 VIEW COMPARISON:  Three days ago FINDINGS: Endotracheal tube with tip at the clavicular heads. The feeding tube at least reaches the stomach. Right subclavian line with tip near the right brachiocephalic vein. Left IJ line with tip at the SVC. Cardiomegaly. Dense to hazy opacity at the bases. No visible effusion or pneumothorax. IMPRESSION: Unchanged hardware positioning and lower lobe opacification. Electronically Signed   By: Rachel Chaney M.D.   On: 27-Mar-2020 05:20   DG Chest Port 1 View  Result Date: 02/27/2020 CLINICAL DATA:  Respiratory  failure. EXAM: PORTABLE CHEST 1 VIEW.  Patient is rotated. COMPARISON:  CT chest 01/31/2020, chest x-ray 02/24/2020 FINDINGS: The internal jugular central venous catheter, endotracheal tube, enteric tube, and right chest wall dialysis catheter are in grossly similar positions with limited evaluation due to patient rotation.  The heart size and mediastinal contours are unchanged. Aortic arch calcifications. Persistent bibasilar streaky airspace opacities. Linear atelectasis within the lingula. No pulmonary edema. Possibly improved trace right pleural effusion. Persistent trace to small left pleural effusion. No pneumothorax. No acute osseous abnormality. IMPRESSION: 1. Persistent bibasilar patchy airspace opacity as well as trace to small volume bilateral pleural effusions, left greater than right. 2. Lines and tubes are in grossly similar positions with limited evaluation due to patient rotation. Electronically Signed   By: Iven Finn M.D.   On: 02/27/2020 06:33   DG CHEST PORT 1 VIEW  Result Date: 02/24/2020 CLINICAL DATA:  Evaluate central venous catheter placement EXAM: PORTABLE CHEST 1 VIEW COMPARISON:  02/24/2020 FINDINGS: ET tube tip is above the carina. There is a feeding tube with tip below the level of the GE junction. Left IJ catheter tip is at the cavoatrial junction. There is a larger bore right subclavian catheter with tip in the projection of the SVC. No pneumothorax identified bilaterally. Progressive atelectasis and or airspace disease is noted within both lower lung zones. IMPRESSION: 1. New right subclavian catheter with tip in the projection of the SVC. Left IJ catheter is also new with tip in the cavoatrial junction. No pneumothorax. 2. Progressive atelectasis and/or airspace disease within both lower lung zones. Electronically Signed   By: Kerby Moors M.D.   On: 02/24/2020 11:43   DG CHEST PORT 1 VIEW  Result Date: 02/24/2020 CLINICAL DATA:  Support apparatus EXAM: PORTABLE CHEST  1 VIEW COMPARISON:  02/23/2020 at 10:54 p.m. FINDINGS: Support Apparatus: --Endotracheal tube: Tip at the level of the clavicular heads. --Enteric tube:Tip and sideport project over the stomach. --Catheter(s):None --Other: None Multifocal bilateral linear opacity, likely atelectasis. IMPRESSION: Unchanged support apparatus. Electronically Signed   By: Ulyses Jarred M.D.   On: 02/24/2020 00:19   DG CHEST PORT 1 VIEW  Result Date: 02/23/2020 CLINICAL DATA:  Intubated, respiratory failure EXAM: PORTABLE CHEST 1 VIEW COMPARISON:  02/23/2020 FINDINGS: Single frontal view of the chest demonstrates endotracheal tube overlying tracheal air column, 1.7 cm above carina. Enteric catheter passes below diaphragm tip excluded by collimation. Cardiac silhouette is stable. Bibasilar airspace disease, left greater than right, unchanged since prior exam. No large effusion or pneumothorax. No acute bony abnormalities. IMPRESSION: 1. No complication after intubation. Persistent bibasilar consolidation. Electronically Signed   By: Randa Ngo M.D.   On: 02/23/2020 23:04   DG CHEST PORT 1 VIEW  Result Date: 02/23/2020 CLINICAL DATA:  Acute on chronic respiratory failure EXAM: PORTABLE CHEST 1 VIEW COMPARISON:  Two days ago FINDINGS: Cardiomegaly. Low volume chest with atelectasis or pneumonia on both sides. No visible effusion or pneumothorax. IMPRESSION: Stable low volume chest and pulmonary opacification Electronically Signed   By: Rachel Chaney M.D.   On: 02/23/2020 06:11   DG Chest Port 1 View  Result Date: 02/21/2020 CLINICAL DATA:  60 year old female intubated.  Respiratory failure. EXAM: PORTABLE CHEST 1 VIEW COMPARISON:  Portable chest 02/18/2020 and earlier. FINDINGS: Portable AP semi upright view at 0446 hours. Extubated and enteric tube removed. Stable cardiomegaly and mediastinal contours. Stable to mildly improved lung volumes. Continued bibasilar veiling and confluent pulmonary opacity. Upper lung  vascularity is stable. No pneumothorax. Paucity of bowel gas. Stable visualized osseous structures. IMPRESSION: 1. Extubated and enteric tube removed with stable to mildly improved lung volumes. 2. Continued bibasilar collapse/consolidation with probable pleural effusions. Electronically Signed   By: Genevie Ann M.D.   On: 02/21/2020 07:32   DG  Chest Port 1 View  Result Date: 02/18/2020 CLINICAL DATA:  60 year old female intubated.  Respiratory failure. EXAM: PORTABLE CHEST 1 VIEW COMPARISON:  Portable chest 02/16/2020 and earlier. FINDINGS: Portable AP view at 0448 hours. Stable endotracheal tube tip about 9 mm above the carina. Enteric tube courses to the stomach with side hole at the level of the gastric body. Confluent bilateral lung base opacity not significantly changed since 02/13/2020. Stable cardiac size and mediastinal contours. No pneumothorax. No pulmonary edema. Small pleural effusions difficult to exclude. Paucity of bowel gas. No acute osseous abnormality identified. IMPRESSION: 1.  Stable lines and tubes. 2. Bilateral lower lobe collapse or consolidation with ventilation not significantly changed since 02/13/2020. Possible small pleural effusions. Electronically Signed   By: Genevie Ann M.D.   On: 02/18/2020 07:10   DG CHEST PORT 1 VIEW  Result Date: 02/16/2020 CLINICAL DATA:  60 year old female intubated.  Respiratory failure. EXAM: PORTABLE CHEST 1 VIEW COMPARISON:  Portable chest 02/13/2020 and earlier. FINDINGS: Portable AP semi upright view at 0427 hours. Patient substantially rotated to the left today. Endotracheal tube tip about 10 mm above the carina. Enteric tube courses to the left abdomen as before. Grossly stable mediastinal contours. Streaky perihilar and lung base opacity not significantly changed when allowing for the patient rotation today. No pneumothorax identified. Minimally displaced left lateral rib fractures are evident on this image, and appear un healed at the left 4th and 5th  rib levels. Additional chronic rib fractures noted on CT last month. Paucity of bowel gas in the upper abdomen. IMPRESSION: 1. Portable exam rotated to the left. Bilateral pneumonia with stable ventilation allowing for the degree of obliquity. 2.  Stable lines and tubes. 3. Recent minimally displaced fractures of the left 4th and 5th ribs identified. Electronically Signed   By: Genevie Ann M.D.   On: 02/16/2020 06:51   Portable Chest x-ray  Result Date: 02/13/2020 CLINICAL DATA:  Endotracheal tube EXAM: PORTABLE CHEST 1 VIEW COMPARISON:  02/29/2020 FINDINGS: Endotracheal tube with the tip 1.2 cm above the carina. Recommend retracting the endotracheal tube 2 cm. Nasogastric tube coursing below the diaphragm. Bibasilar airspace disease which may reflect atelectasis versus pneumonia. No pleural effusion or pneumothorax. Heart and mediastinal contours are unremarkable. No acute osseous abnormality. IMPRESSION: 1. Endotracheal tube with the tip 1.2 cm above the carina. Recommend retracting the endotracheal tube 2 cm. 2. Bibasilar airspace disease which may reflect atelectasis versus pneumonia. Electronically Signed   By: Kathreen Devoid   On: 02/13/2020 15:46   ECHOCARDIOGRAM COMPLETE  Result Date: 02/01/2020    ECHOCARDIOGRAM REPORT   Patient Name:   ANGELITA HARNACK Date of Exam: 02/01/2020 Medical Rec #:  [id number]         Height:       64.0 in Accession #:    [id number]        Weight:       223.2 lb Date of Birth:  16-Apr-1960         BSA:          2.050 m Patient Age:    23 years          BP:           100/59 mmHg Patient Gender: F                 HR:           87 bpm. Exam Location:  Inpatient Procedure: 2D Echo, Cardiac Doppler and Color Doppler Indications:  I50.9* Heart failure (unspecified)  History:        Patient has prior history of Echocardiogram examinations, most                 recent 03/08/2018. COVID 19 positive.  Sonographer:    Merrie Roof RDCS Referring Phys: [id number] [place]  1.  Left ventricular ejection fraction, by estimation, is 60 to 65%. The left ventricle has normal function. The left ventricle has no regional wall motion abnormalities. There is severe concentric left ventricular hypertrophy.  2. Right ventricular systolic function is mildly reduced. The right ventricular size is mildly enlarged. There is mildly elevated pulmonary artery systolic pressure. The estimated right ventricular systolic pressure is 71.6 mmHg.  3. The mitral valve is abnormal. Mild mitral valve regurgitation. Moderate mitral annular calcification.  4. The aortic valve is calcified. There is moderate calcification of the aortic valve. There is moderate thickening of the aortic valve. Aortic valve regurgitation is trivial. Mild aortic valve stenosis.  5. The inferior vena cava is normal in size with <50% respiratory variability, suggesting right atrial pressure of 8 mmHg. Comparison(s): Compared to prior study, the RV appears only mildly enlarged with mild systolic dysfunction. FINDINGS  Left Ventricle: Left ventricular ejection fraction, by estimation, is 60 to 65%. The left ventricle has normal function. The left ventricle has no regional wall motion abnormalities. The left ventricular internal cavity size was small. There is severe concentric left ventricular hypertrophy. Right Ventricle: The right ventricular size is mildly enlarged. Right vetricular wall thickness was not well visualized. Right ventricular systolic function is mildly reduced. There is mildly elevated pulmonary artery systolic pressure. The tricuspid regurgitant velocity is 2.86 m/s, and with an assumed right atrial pressure of 8 mmHg, the estimated right ventricular systolic pressure is 96.7 mmHg. Left Atrium: Left atrial size was normal in size. Right Atrium: Right atrial size was normal in size. Pericardium: There is no evidence of pericardial effusion. Mitral Valve: The mitral valve is abnormal. There is moderate thickening of the  mitral valve leaflet(s). There is mild calcification of the mitral valve leaflet(s). Moderate mitral annular calcification. Mild mitral valve regurgitation. Tricuspid Valve: The tricuspid valve is normal in structure. Tricuspid valve regurgitation is mild. Aortic Valve: The aortic valve is calcified. There is moderate calcification of the aortic valve. There is moderate thickening of the aortic valve. Aortic valve regurgitation is trivial. Mild aortic stenosis is present. Aortic valve mean gradient measures 13.0 mmHg. Aortic valve peak gradient measures 25.2 mmHg. Aortic valve area, by VTI measures 0.98 cm. Pulmonic Valve: The pulmonic valve was normal in structure. Pulmonic valve regurgitation Not asssessed. Aorta: The aortic root is normal in size and structure. Venous: The inferior vena cava is normal in size with less than 50% respiratory variability, suggesting right atrial pressure of 8 mmHg. IAS/Shunts: No atrial level shunt detected by color flow Doppler.  LEFT VENTRICLE PLAX 2D LVOT diam:     2.00 cm LV SV:         51 LV SV Index:   25 LVOT Area:     3.14 cm  IVC IVC diam: 2.00 cm AORTIC VALVE AV Area (Vmax):    1.24 cm AV Area (Vmean):   1.23 cm AV Area (VTI):     0.98 cm AV Vmax:           251.00 cm/s AV Vmean:          165.000 cm/s AV VTI:  0.515 m AV Peak Grad:      25.2 mmHg AV Mean Grad:      13.0 mmHg LVOT Vmax:         99.20 cm/s LVOT Vmean:        64.400 cm/s LVOT VTI:          0.161 m LVOT/AV VTI ratio: 0.31 TRICUSPID VALVE TR Peak grad:   32.7 mmHg TR Vmax:        286.00 cm/s  SHUNTS Systemic VTI:  0.16 m Systemic Diam: 2.00 cm Gwyndolyn Kaufman MD Electronically signed by Gwyndolyn Kaufman MD Signature Date/Time: 02/01/2020/12:46:35 PM    Final    VAS [place] LOWER EXTREMITY VENOUS (DVT)  Result Date: 02/25/2020  Lower Venous DVT Study Indications: SOB, suspect PE, recent Covid-19.  Limitations: Body habitus, bandages, and poor ultrasound/tissue interface due to skin changes.  Comparison Study: 02-28-2018 Lower extremity venous bilateral (limited study)                   was negative for DVT. Performing Technologist: Darlin Coco RDMS  Examination Guidelines: A complete evaluation includes B-mode imaging, spectral Doppler, color Doppler, and power Doppler as needed of all accessible portions of each vessel. Bilateral testing is considered an integral part of a complete examination. Limited examinations for reoccurring indications may be performed as noted. The reflux portion of the exam is performed with the patient in reverse Trendelenburg.  +---------+---------------+---------+-----------+----------+-------------------+ RIGHT    CompressibilityPhasicitySpontaneityPropertiesThrombus Aging      +---------+---------------+---------+-----------+----------+-------------------+ CFV      Full           Yes      Yes                                      +---------+---------------+---------+-----------+----------+-------------------+ SFJ      Full                                                             +---------+---------------+---------+-----------+----------+-------------------+ FV Prox  Full                                                             +---------+---------------+---------+-----------+----------+-------------------+ FV Mid   Full           Yes      Yes                                      +---------+---------------+---------+-----------+----------+-------------------+ FV Distal               Yes      Yes                                      +---------+---------------+---------+-----------+----------+-------------------+ PFV      Full                                                             +---------+---------------+---------+-----------+----------+-------------------+  POP      Full           Yes      Yes                                       +---------+---------------+---------+-----------+----------+-------------------+ PTV                                                   Not well visualized +---------+---------------+---------+-----------+----------+-------------------+ PERO                                                  Not well visualized +---------+---------------+---------+-----------+----------+-------------------+   +---------+---------------+---------+-----------+----------+-------------------+ LEFT     CompressibilityPhasicitySpontaneityPropertiesThrombus Aging      +---------+---------------+---------+-----------+----------+-------------------+ CFV      Full           Yes      Yes                                      +---------+---------------+---------+-----------+----------+-------------------+ SFJ      Full                                                             +---------+---------------+---------+-----------+----------+-------------------+ FV Prox  Full                                                             +---------+---------------+---------+-----------+----------+-------------------+ FV Mid   Full                                                             +---------+---------------+---------+-----------+----------+-------------------+ FV Distal                                             Bandages            +---------+---------------+---------+-----------+----------+-------------------+ PFV      Full                                                             +---------+---------------+---------+-----------+----------+-------------------+ POP      Full           Yes      Yes                                      +---------+---------------+---------+-----------+----------+-------------------+  PTV                     Yes      Yes                  Patent by color     +---------+---------------+---------+-----------+----------+-------------------+  PERO                                                  Not well visualized +---------+---------------+---------+-----------+----------+-------------------+    Summary: RIGHT: - There is no evidence of deep vein thrombosis in the lower extremity. However, portions of this examination were limited- see technologist comments above.  - No cystic structure found in the popliteal fossa.  LEFT: - There is no evidence of deep vein thrombosis in the lower extremity. However, portions of this examination were limited- see technologist comments above.  - No cystic structure found in the popliteal fossa.  *See table(s) above for measurements and observations. Electronically signed by Harold Barban MD on 02/25/2020 at 9:45:54 PM.    Final    ECHOCARDIOGRAM LIMITED  Result Date: 02/26/2020    ECHOCARDIOGRAM LIMITED REPORT   Patient Name:   KYARRA VANCAMP Date of Exam: 02/26/2020 Medical Rec #:  [id number]         Height:       64.0 in Accession #:    [id number]        Weight:       206.1 lb Date of Birth:  06-03-1960         BSA:          1.982 m Patient Age:    60 years          BP:           146/44 mmHg Patient Gender: F                 HR:           84 bpm. Exam Location:  Inpatient Procedure: Limited Echo, Limited Color Doppler and Cardiac Doppler Indications:     Cardiac Arrest I46.9  History:         Patient has prior history of Echocardiogram examinations, most                  recent 02/01/2020. CHF; Risk Factors:Hypertension, Dyslipidemia                  and Diabetes.  Sonographer:     Mikki Santee RDCS (AE) Referring Phys:  [place] Diagnosing Phys: Buford Dresser MD IMPRESSIONS  1. Left ventricular ejection fraction, by estimation, is >75%. The left ventricle has hyperdynamic function.  2. Right ventricular systolic function is mildly reduced. The right ventricular size is mildly enlarged.  3. There is no evidence of cardiac tamponade.  4. Moderate to severe mitral annular calcification.   5. There is moderate calcification of the aortic valve. There is moderate thickening of the aortic valve. Aortic valve regurgitation is trivial. Mild aortic valve stenosis. Conclusion(s)/Recommendation(s): Limited echo post cardiac arrest. Hyperdynamic LV function, RV appears similar to prior. There is a left pleural effusion visualized but only a trivial pericardial effusion, no tamponade noted. FINDINGS  Left Ventricle: Left ventricular ejection fraction, by estimation, is >75%. The left ventricle has hyperdynamic function. Right Ventricle: The right ventricular size is mildly  enlarged. Right ventricular systolic function is mildly reduced. Pericardium: Trivial pericardial effusion is present. There is no evidence of cardiac tamponade. Mitral Valve: There is moderate thickening of the mitral valve leaflet(s). There is moderate calcification of the mitral valve leaflet(s). Moderate to severe mitral annular calcification. Tricuspid Valve: The tricuspid valve is normal in structure. Tricuspid valve regurgitation is trivial. No evidence of tricuspid stenosis. Aortic Valve: There is moderate calcification of the aortic valve. There is moderate thickening of the aortic valve. Aortic valve regurgitation is trivial. Mild aortic stenosis is present. Aortic valve mean gradient measures 14.5 mmHg. Aortic valve peak gradient measures 31.4 mmHg. Aortic valve area, by VTI measures 1.93 cm. Pulmonic Valve: The pulmonic valve was grossly normal. Pulmonic valve regurgitation is not visualized. Aorta: The aortic root and ascending aorta are structurally normal, with no evidence of dilitation. Venous: IVC assessment for right atrial pressure unable to be performed due to mechanical ventilation. Additional Comments: There is a small pleural effusion in the left lateral region. LEFT VENTRICLE PLAX 2D LVIDd:         3.50 cm  Diastology LVIDs:         1.90 cm  LV e' medial:    5.27 cm/s LV PW:         1.40 cm  LV E/e' medial:  20.3 LV  IVS:        1.50 cm  LV e' lateral:   3.81 cm/s LVOT diam:     1.90 cm  LV E/e' lateral: 28.1 LV SV:         83 LV SV Index:   42 LVOT Area:     2.84 cm  LEFT ATRIUM           Index LA diam:      3.80 cm 1.92 cm/m LA Vol (A4C): 41.2 ml 20.79 ml/m  AORTIC VALVE AV Area (Vmax):    1.17 cm AV Area (Vmean):   1.27 cm AV Area (VTI):     1.93 cm AV Vmax:           280.00 cm/s AV Vmean:          174.500 cm/s AV VTI:            0.428 m AV Peak Grad:      31.4 mmHg AV Mean Grad:      14.5 mmHg LVOT Vmax:         116.00 cm/s LVOT Vmean:        78.300 cm/s LVOT VTI:          0.291 m LVOT/AV VTI ratio: 0.68 MITRAL VALVE MV Area (PHT): 2.01 cm     SHUNTS MV Decel Time: 377 msec     Systemic VTI:  0.29 m MV E velocity: 107.00 cm/s  Systemic Diam: 1.90 cm MV A velocity: 148.00 cm/s MV E/A ratio:  0.72 Buford Dresser MD Electronically signed by Buford Dresser MD Signature Date/Time: 02/26/2020/1:54:13 PM    Final (Updated)    [place] EKG SITE RITE  Result Date: 02/21/2020 If Site Rite image not attached, placement could not be confirmed due to current cardiac rhythm.   Microbiology Recent Results (from the past 240 hour(s))  Culture, Respiratory w Gram Stain     Status: None   Collection Time: 02/24/20  4:11 AM   Specimen: Tracheal Aspirate; Respiratory  Result Value Ref Range Status   Specimen Description TRACHEAL ASPIRATE  Final   Special Requests Normal  Final   Gram Stain  Final    RARE WBC PRESENT, PREDOMINANTLY PMN NO ORGANISMS SEEN Performed at [facility] [address] [place], [place] [address]    Culture FEW CANDIDA TROPICALIS FEW CANDIDA ALBICANS   Final   Report Status 02/26/2020 FINAL  Final  Culture, blood (routine x 2)     Status: None   Collection Time: 02/24/20  9:44 AM   Specimen: BLOOD RIGHT HAND  Result Value Ref Range Status   Specimen Description BLOOD RIGHT HAND  Final   Special Requests   Final    AEROBIC BOTTLE ONLY Blood Culture results may not be  optimal due to an inadequate volume of blood received in culture bottles   Culture   Final    NO GROWTH 5 DAYS Performed at [facility], [place] [facility] [place] [address] [place], [place] [address]    Report Status 02/29/2020 FINAL  Final  Culture, blood (routine x 2)     Status: None   Collection Time: 02/24/20  9:47 AM   Specimen: BLOOD RIGHT HAND  Result Value Ref Range Status   Specimen Description BLOOD RIGHT HAND  Final   Special Requests   Final    AEROBIC BOTTLE ONLY Blood Culture results may not be optimal due to an inadequate volume of blood received in culture bottles   Culture   Final    NO GROWTH 5 DAYS Performed at [facility], [place] [address], [place], [place] [address]    Report Status 02/29/2020 FINAL  Final  C Difficile Quick Screen (NO PCR Reflex)     Status: None   Collection Time: 02/24/20 11:16 AM   Specimen: STOOL  Result Value Ref Range Status   C Diff antigen NEGATIVE NEGATIVE Final   C Diff toxin NEGATIVE NEGATIVE Final   C Diff interpretation No C. difficile detected.  Final    Comment: Performed at [facility], [place] [address], [place], [place] [address]  MRSA PCR Screening     Status: None   Collection Time: 02/24/20  2:28 PM   Specimen: Nasopharyngeal  Result Value Ref Range Status   MRSA by PCR NEGATIVE NEGATIVE Final    Comment:        The GeneXpert MRSA Assay (FDA approved for NASAL specimens only), is one component of a comprehensive MRSA colonization surveillance program. It is not intended to diagnose MRSA infection nor to guide or monitor treatment for MRSA infections. Performed at [facility], [place] [address], [place], [place] [address]     Lab Basic Metabolic Panel: Recent Labs  Lab 02/26/20 0533 02/26/20 2233 02/27/20 0354 02/27/20 1708 02/28/20 0350 02/29/20 0328 03-30-20 0329  NA 140   < > 137 139 136 136 137  K 2.9*   < > 3.2* 3.1* 3.5 3.6 4.0  CL 94*   < > 97* 99 99 98 101  CO2 29   < > 29 27 25 23 23    GLUCOSE 138*   < > 192* 114* 155* 272* 125*  BUN 61*   < > 38* 44* 50* 65* 48*  CREATININE 4.72*   < > 3.41* 3.99* 4.34* 5.33* 4.07*  CALCIUM 8.8*   < > 8.8* 8.7* 8.9 8.9 8.9  MG 2.3  --  2.1  --  2.2 2.2 2.2  PHOS 4.9*   < > 4.6 4.6 5.2* 6.5* 5.9*   < > = values in this interval not displayed.   Liver Function Tests: Recent Labs  Lab 02/27/20 0354 02/27/20 1708 02/28/20 0350 02/29/20 0328 March 10, 2020 0329  ALBUMIN 1.7* 1.6* 1.7* 1.7* 1.6*   No results for input(s): LIPASE, AMYLASE in the last 168 hours. No results for input(s): AMMONIA in the last 168 hours. CBC: Recent Labs  Lab 02/26/20 0533 02/27/20 0354 02/28/20 0350 02/29/20 0328 03-10-20 0329  WBC 57.6* 45.8* 41.7* 33.6* 35.4*  HGB 7.1* 6.5* 7.6* 7.4* 7.6*  HCT 23.7* 21.2* 25.2* 23.6* 25.0*  MCV 108.2* 108.2* 105.0* 104.9* 107.8*  PLT 205 183 145* 129* 136*   Cardiac Enzymes: No results for input(s): CKTOTAL, CKMB, CKMBINDEX, TROPONINI in the last 168 hours. Sepsis Labs: Recent Labs  Lab 02/23/20 1634 02/23/20 2320 02/24/20 1129 02/25/20 0340 02/27/20 0354 02/28/20 0350 02/29/20 0328 03/10/20 0329  WBC  --    < >  --    < > 45.8* 41.7* 33.6* 35.4*  LATICACIDVEN >11.0*  --  2.2*  --   --   --   --   --    < > = values in this interval not displayed.    Procedures/Operations  See chart   Audria Nine 03/10/2020, 3:14 PM

## 2020-03-09 NOTE — Progress Notes (Signed)
[name], MRN:  [id number], DOB:  1960-09-13, LOS: 23 ADMISSION DATE:  02/25/2020, CONSULTATION DATE:  02/16/2020 REFERRING MD:  FMTS, CHIEF COMPLAINT:  PEA arrest    Brief History:  Rachel Chaney is a 60 y.o. female with a pPMH of ESRD on HD (MWF), Chronic hypoxic respiratory failure, T2DM, HFpEF, HTN, HLD, CAD s/p MI (2020) who presented to [facility] with chronic nonspecific skin pain over her bilateral lower extremities and her hospitalization has been complicated by acute on chronic hypoxic respiratory failure and hypotension.   Patient had PEA cardiac arrest during dialysis on 2/4, she was transferred to ICU. She was covid positive prior to this admit on 01/31/20  Past Medical History:  ESRD, Type II diabetes, HLD, HTN, Cardiac arrest, CHF  Significant Hospital Events:  02/02 - Admitted for leg pain found hypoxic and hypotensive.  02/04 - PEA arrest xxxxxxx 2/11 - Precedex weaned off this morning Remains on insulin drip but likely can be transitioned levo 3. 0.50, currently on pressure support ventilation. Scheduled for hemodialysis later today 2/12 - remains extubated  since yesterday.  Overnight  Needing pressors.  Ongoping challenges with measuring BP -> being measured RLE. On [place] -o2 -following some commands. Deconditioned and weak. Difffuse pain per RN 2/14: Patient became unresponsive, not hypoglycemic, hypotensive and hypoxic. Reintubated due to change in her mental status and hypoxia. Bedside echocardiogram was done and showed RV dilatation and hypokinesis, new compared to the echocardiogram which was done 3 weeks ago.  With sudden change in hemodynamics, decision was made to give [facility] and then patient was started on heparin infusion  2/17 transitioned to [facility] 2//18 bleeding from CVL site requiring PRBC transfusion; resolved after placing extra suture 2/19 off levophed  Consults:  Nephrology  Cardiology  Procedures:  ETT 2/4> 2/11 ETT 2/14 > Right subclavian HD 2/15  > Left IJ CVC 2/15 > Right axillary art line 2/15 >  Significant Diagnostic Tests:   01/23 Echo > 60 to 65%  02/02 CT Abdomen/Pelvis > No acute abnormality. Multiple chronic findings as detailed above, not substantially changed from recent prior study.  02/02 CT left and right femur > No acute osseous abnormality. There is nonspecific lower extremity edema without evidence for an abscess. Findings are nonspecific but can be seen in patients with cellulitis in the appropriate clinical setting. Other differential considerations include chronic venous insufficiency given the presence of subcutaneous calcifications.  2/16 BL PVL > No evidence of deep vein thrombosis in bilateral lower extremities, though study is limited by body habitus, bandages, and poor ultrasound/tissue interface due to skin changes.   2/17 Echo > Hyperdynamic LV function EF >75%, RV appears similar to prior. There is a left pleural effusion visualized but only a trivial pericardial effusion, no tamponade noted.   Micro Data:  01/31/20 - covid positive  MRSA PCR 2/4 > neg Blood culture 2/4 > neg Respiratory 2/4 > neg Respiratory culture 2/15 > Candida albicans and tropicalis C. difficile 2/15 >> negative Blood 2/15 >>   Antimicrobials:  Vancomycin 2/14 >> 2/20 Cefepime 2/14 Zosyn 2/15 >> 2/20   Interim History / Subjective:  2/21: peri arrest with sudden brady to 40's and hypotensive to 60 despite levo gtt. sats dropped to 32. Recovered with atropine. Husband at bedside and has opted to transition to comfortcare.  Will have family coming and minimize interventions.   Objective   Blood pressure (!) 157/57, pulse 77, temperature 98.6 F (37 C), resp. rate (!) 29, height  5\' 4"  (1.626 m), weight 99.8 kg, last menstrual period 09/18/2011, SpO2 (!) 88 %.    Vent Mode: PCV FiO2 (%):  [30 %-50 %] 30 % Set Rate:  [20 bmp] 20 bmp PEEP:  [5 cmH20] 5 cmH20 Pressure Support:  [10 cmH20] 10 cmH20 Plateau Pressure:  [20  cmH20] 20 cmH20   Intake/Output Summary (Last 24 hours) at 03-31-20 1007 Last data filed at 2020/03/31 0900 Gross per 24 hour  Intake 2199.22 ml  Output 1325 ml  Net 874.22 ml   Filed Weights   02/28/20 0500 02/29/20 0359 03/31/2020 0406  Weight: 97.1 kg 99.2 kg 99.8 kg     General - sedated, eyes open but unresponsive, irreg respirations Eyes - pupils reactive ENT - ETT in place, mmmp with copious oral secretions Cardiac - regular rate/rhythm, no murmur Chest - equal breath sounds b/l, no wheezing or rales Abdomen - soft, non tender, + bowel sounds Extremities - 1+ edema Skin - no rashes Neuro - RASS -3  Resolved Hospital Problem list   Diarrhea, Septic shock from HCAP, Metabolic acidosis with lactic acidosis, PEA cardiac arrest  Assessment & Plan:   Acute on chronic hypoxic/hypercapnic respiratory failure from HCAP after recent COVID 19 infection. Hx of OSA/OHS. - pressure support wean as tolerated - day 7/7 of ABx -transition to comfort care -extubate when family ready  ESRD on HD MWF. - stopping dialysis  Presumed PE with Rt heart strain causing hemodynamic collapse on 2/14. - treated with thrombolytic therapy -stop these medications  Hx of chronic hypotension, HLD. - continue ASA, lipitor, midodrine  Acute metabolic encephalopathy from hypoxia, sepsis. - RASS goal 0 to -1  Anemia of critical illness and chronic disease. - f/u CBC - transfuse for Hb < 7 or significant bleeding  DM type 2 poorly controlled with hyperglycemia. - topping tf  Best practice (evaluated daily)  Diet: tube feeds, stopping DVT prophylaxis: heparin gtt GI prophylaxis: pepcid Mobility: Bedrest Disposition: ICU Family communication: husband updated 2/21 at bedside Code Status: dnr/comfort  LABS   CMP Latest Ref Rng & Units Mar 31, 2020 02/29/2020 02/28/2020  Glucose 70 - 99 mg/dL 125(H) 272(H) 155(H)  BUN 8 - 23 mg/dL 48(H) 65(H) 50(H)  Creatinine 0.44 - 1.00 mg/dL 4.07(H)  5.33(H) 4.34(H)  Sodium 135 - 145 mmol/L 137 136 136  Potassium 3.5 - 5.1 mmol/L 4.0 3.6 3.5  Chloride 98 - 111 mmol/L 101 98 99  CO2 22 - 32 mmol/L 23 23 25   Calcium 8.9 - 10.3 mg/dL 8.9 8.9 8.9  Total Protein 6.5 - 8.1 g/dL - - -  Total Bilirubin 0.3 - 1.2 mg/dL - - -  Alkaline Phos 38 - 126 U/L - - -  AST 15 - 41 U/L - - -  ALT 0 - 44 U/L - - -    CBC Latest Ref Rng & Units Mar 31, 2020 02/29/2020 02/28/2020  WBC 4.0 - 10.5 K/uL 35.4(H) 33.6(H) 41.7(H)  Hemoglobin 12.0 - 15.0 g/dL 7.6(L) 7.4(L) 7.6(L)  Hematocrit 36.0 - 46.0 % 25.0(L) 23.6(L) 25.2(L)  Platelets 150 - 400 K/uL 136(L) 129(L) 145(L)    ABG    Component Value Date/Time   PHART 7.482 (H) 02/25/2020 0335   PCO2ART 46.4 02/25/2020 0335   PO2ART 140 (H) 02/25/2020 0335   HCO3 34.5 (H) 02/25/2020 0335   TCO2 39 (H) 02/24/2020 1130   ACIDBASEDEF 17.0 (H) 02/23/2020 2358   O2SAT 99.4 02/25/2020 0335    CBG (last 3)  Recent Labs    02/29/20 2312  2020/03/06 0328 2020-03-06 0825  GLUCAP 86 116* 161*    Critical care time: The patient is critically ill with multiple organ systems failure and requires high complexity decision making for assessment and support, frequent evaluation and titration of therapies, application of advanced monitoring technologies and extensive interpretation of multiple databases.  Critical care time 45 mins. This represents my time independent of the NPs time taking care of the pt. This is excluding procedures.    Audria Nine DO [facility] 2020/03/06, 10:07 AM See Amion for pager If no response to pager, please call [phone] until 1900 After 1900 please call [facility] [phone]

## 2020-03-09 DEATH — deceased

## 2021-09-25 IMAGING — CT CT FEMUR *R* W/ CM
3 of 4 series · 14 of 33 positions shown, 16 images · IV contrast (omnipaque)
Comparison: None.

CLINICAL DATA: Soft tissue mass of the thigh. Concern for
cellulitis.

EXAM:
CT OF THE LOWER BILATERAL EXTREMITY WITH CONTRAST
TECHNIQUE: Multidetector CT imaging of the lower bilateral extremities was
performed according to the standard protocol following intravenous
contrast administration.
CONTRAST:  100mL OMNIPAQUE IOHEXOL 300 MG/ML  SOLN

[Series 3: bilat femur 3.0 b40s · axial · 0.85mm/px · z∈[+520,+907]mm · 6 of 168 slices shown, 8 images]
[im 26/168  soft-tissue]
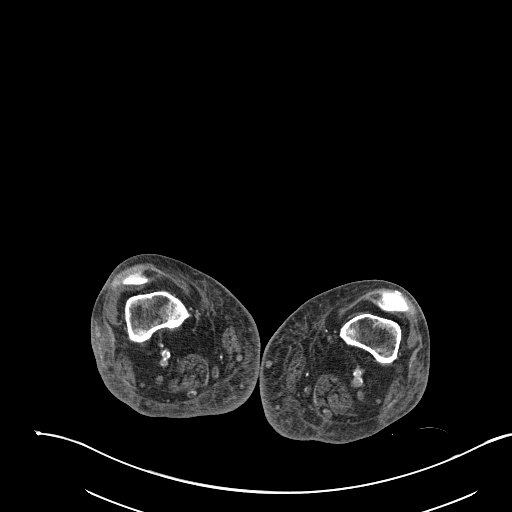
[im 26/168  bone]
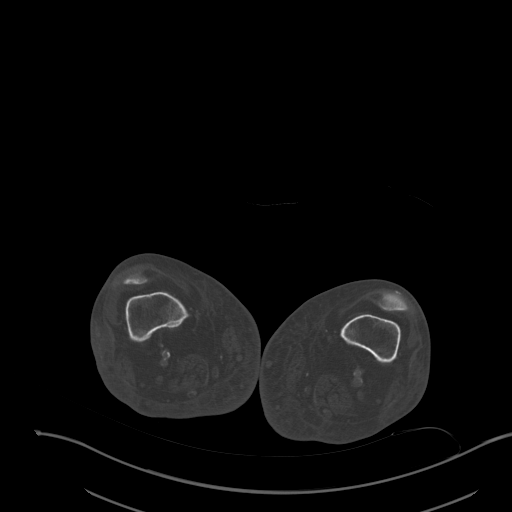
[im 52/168  bone]
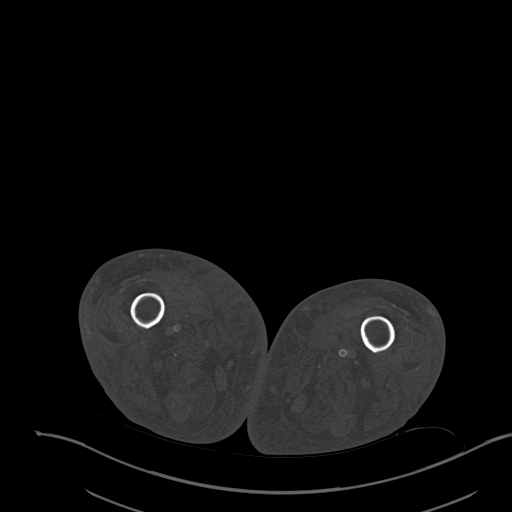
[im 78/168  bone]
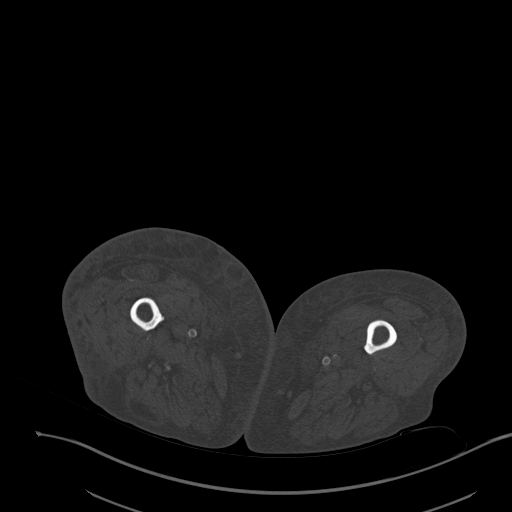
[im 103/168  bone]
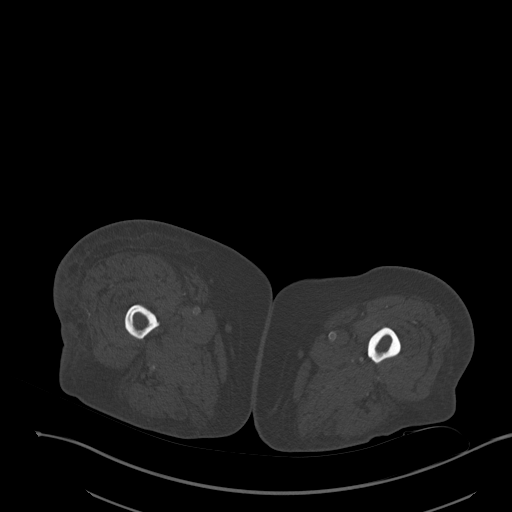
[im 129/168  soft-tissue]
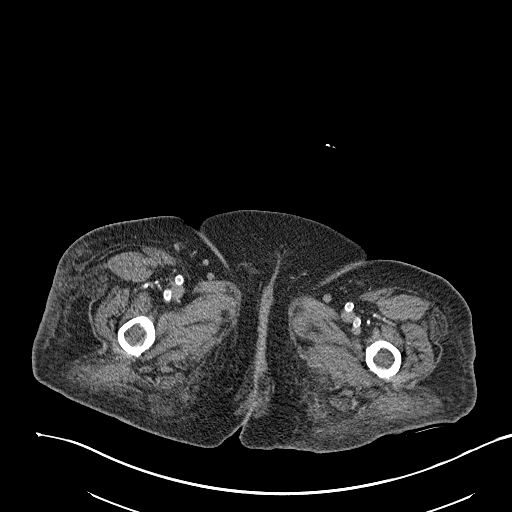
[im 129/168  bone]
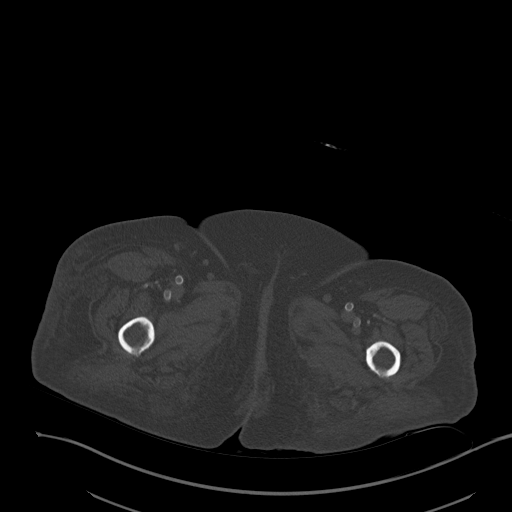
[im 155/168  bone]
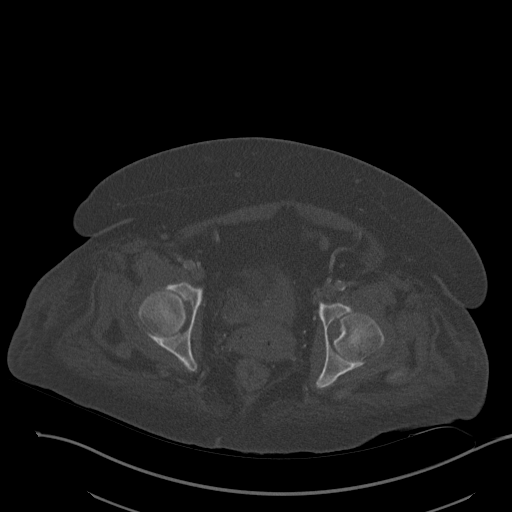

[Series 6: sagittal bone · sagittal · 0.63mm/px · 5 of 205 slices shown]
[im 35/205  bone]
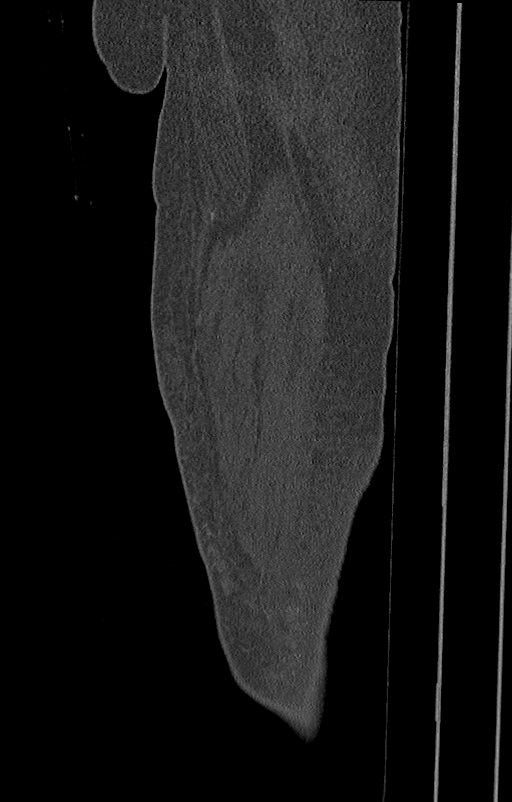
[im 69/205  bone]
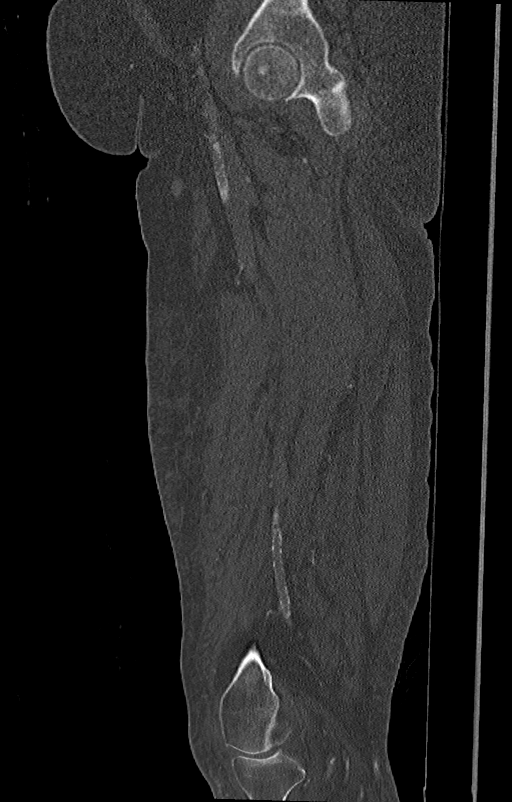
[im 103/205  bone]
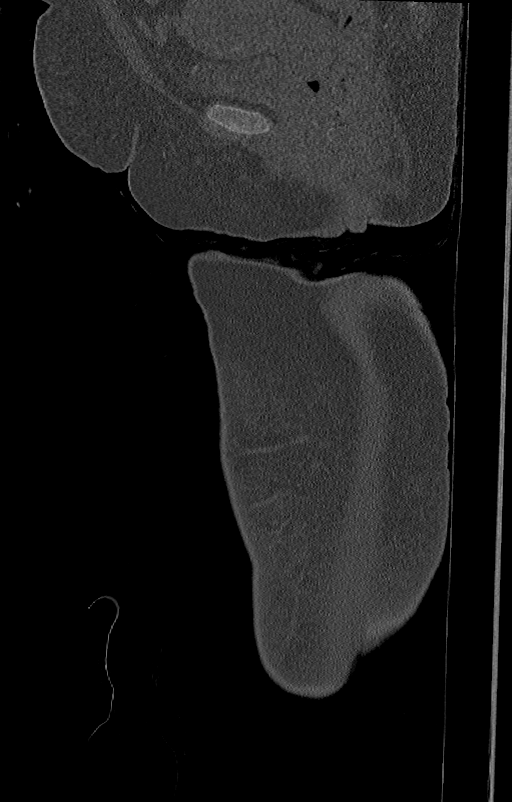
[im 137/205  bone]
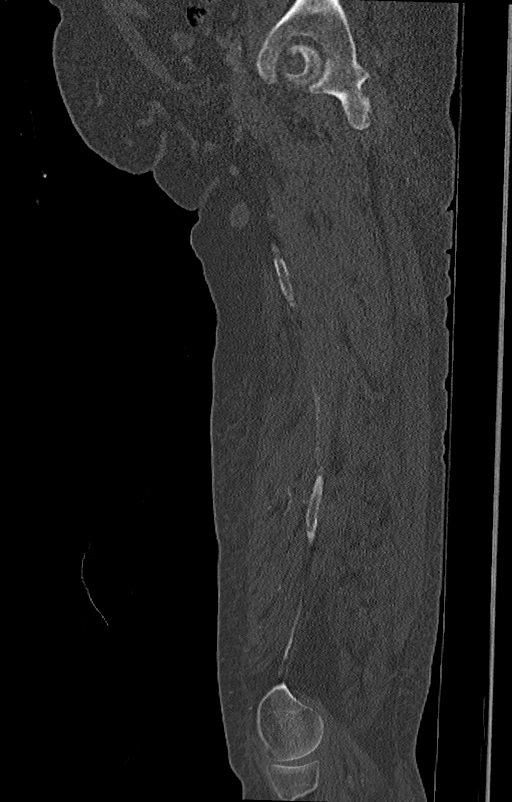
[im 171/205  bone]
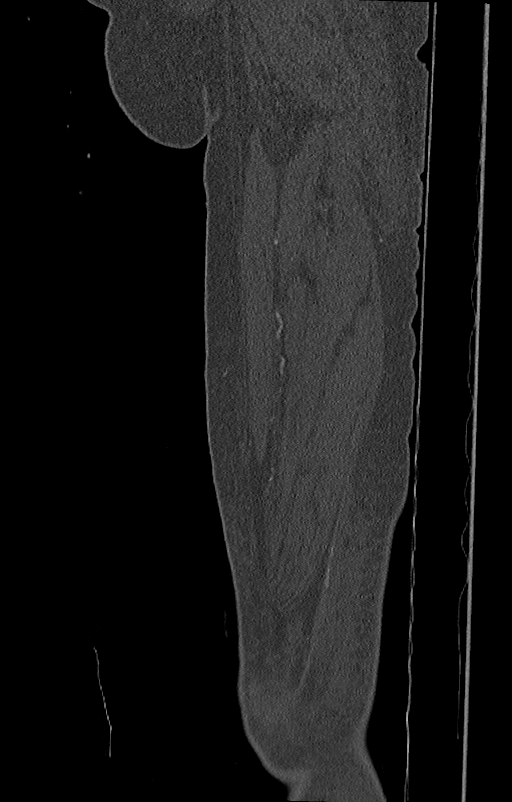

[Series 7: coronalsoft tissue · coronal · 0.80mm/px · 3 of 139 slices shown]
[im 28/139  bone]
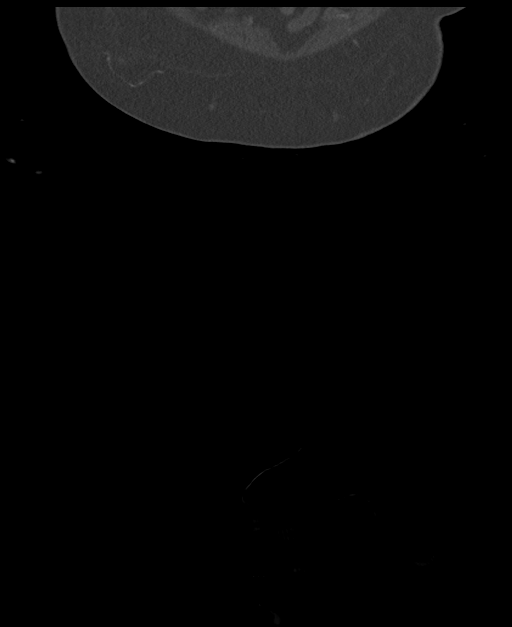
[im 56/139  bone]
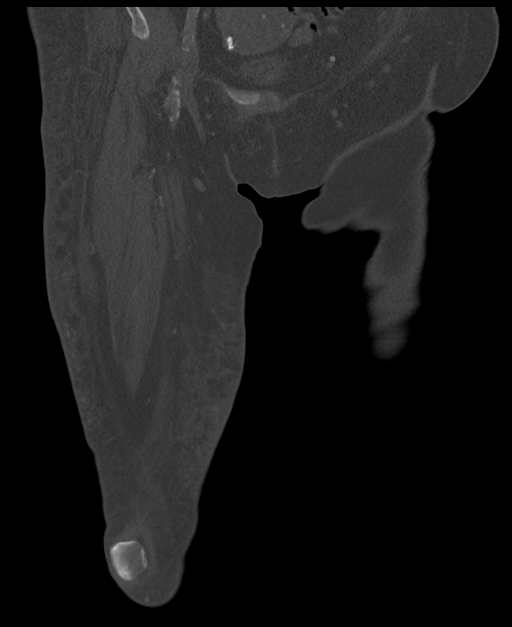
[im 83/139  bone]
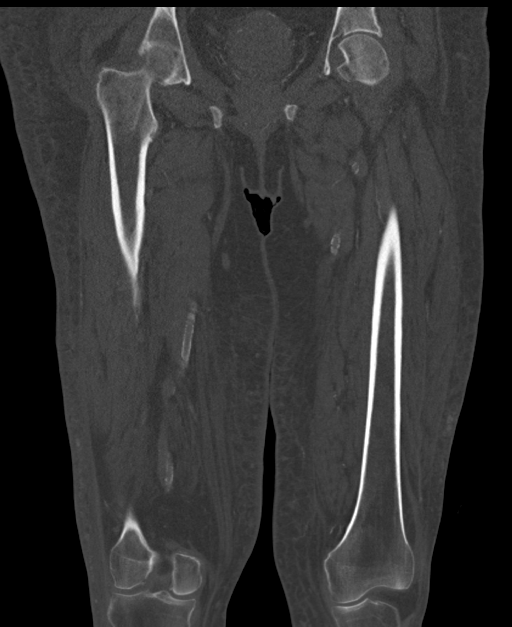

[14 of 33 positions shown; findings below may reference images not displayed]

FINDINGS: Evaluation of the patient's pelvis does not demonstrate an acute
abnormality.

There is no acute osseous abnormality involving the visualized
osseous structures.

There are advanced vascular calcifications bilaterally.

There is nonspecific lower extremity soft tissue edema with multiple
associated subcutaneous calcifications. There is no abscess. No
drainable fluid collection. There is mild overlying skin thickening.
Lower extremity muscular atrophy is noted.

There are no pockets subcutaneous gas. There are mild-to-moderate
degenerative changes of both hips.
IMPRESSION: 1. No acute osseous abnormality.
2. There is nonspecific lower extremity edema without evidence for
an abscess. Findings are nonspecific but can be seen in patients
with cellulitis in the appropriate clinical setting. Other
differential considerations include chronic venous insufficiency
given the presence of subcutaneous calcifications.
3. No radiographic evidence for osteomyelitis.
4.  Aortic Atherosclerosis ([id number]).

## 2021-09-27 IMAGING — DX DG ABDOMEN 1V
1 series · 1 of 1 positions shown · non-contrast
Comparison: CT abdomen pelvis dated 02/11/2020.

CLINICAL DATA: 59-year-old female with enteric tube placement.

EXAM:
ABDOMEN - 1 VIEW

[abdomen]
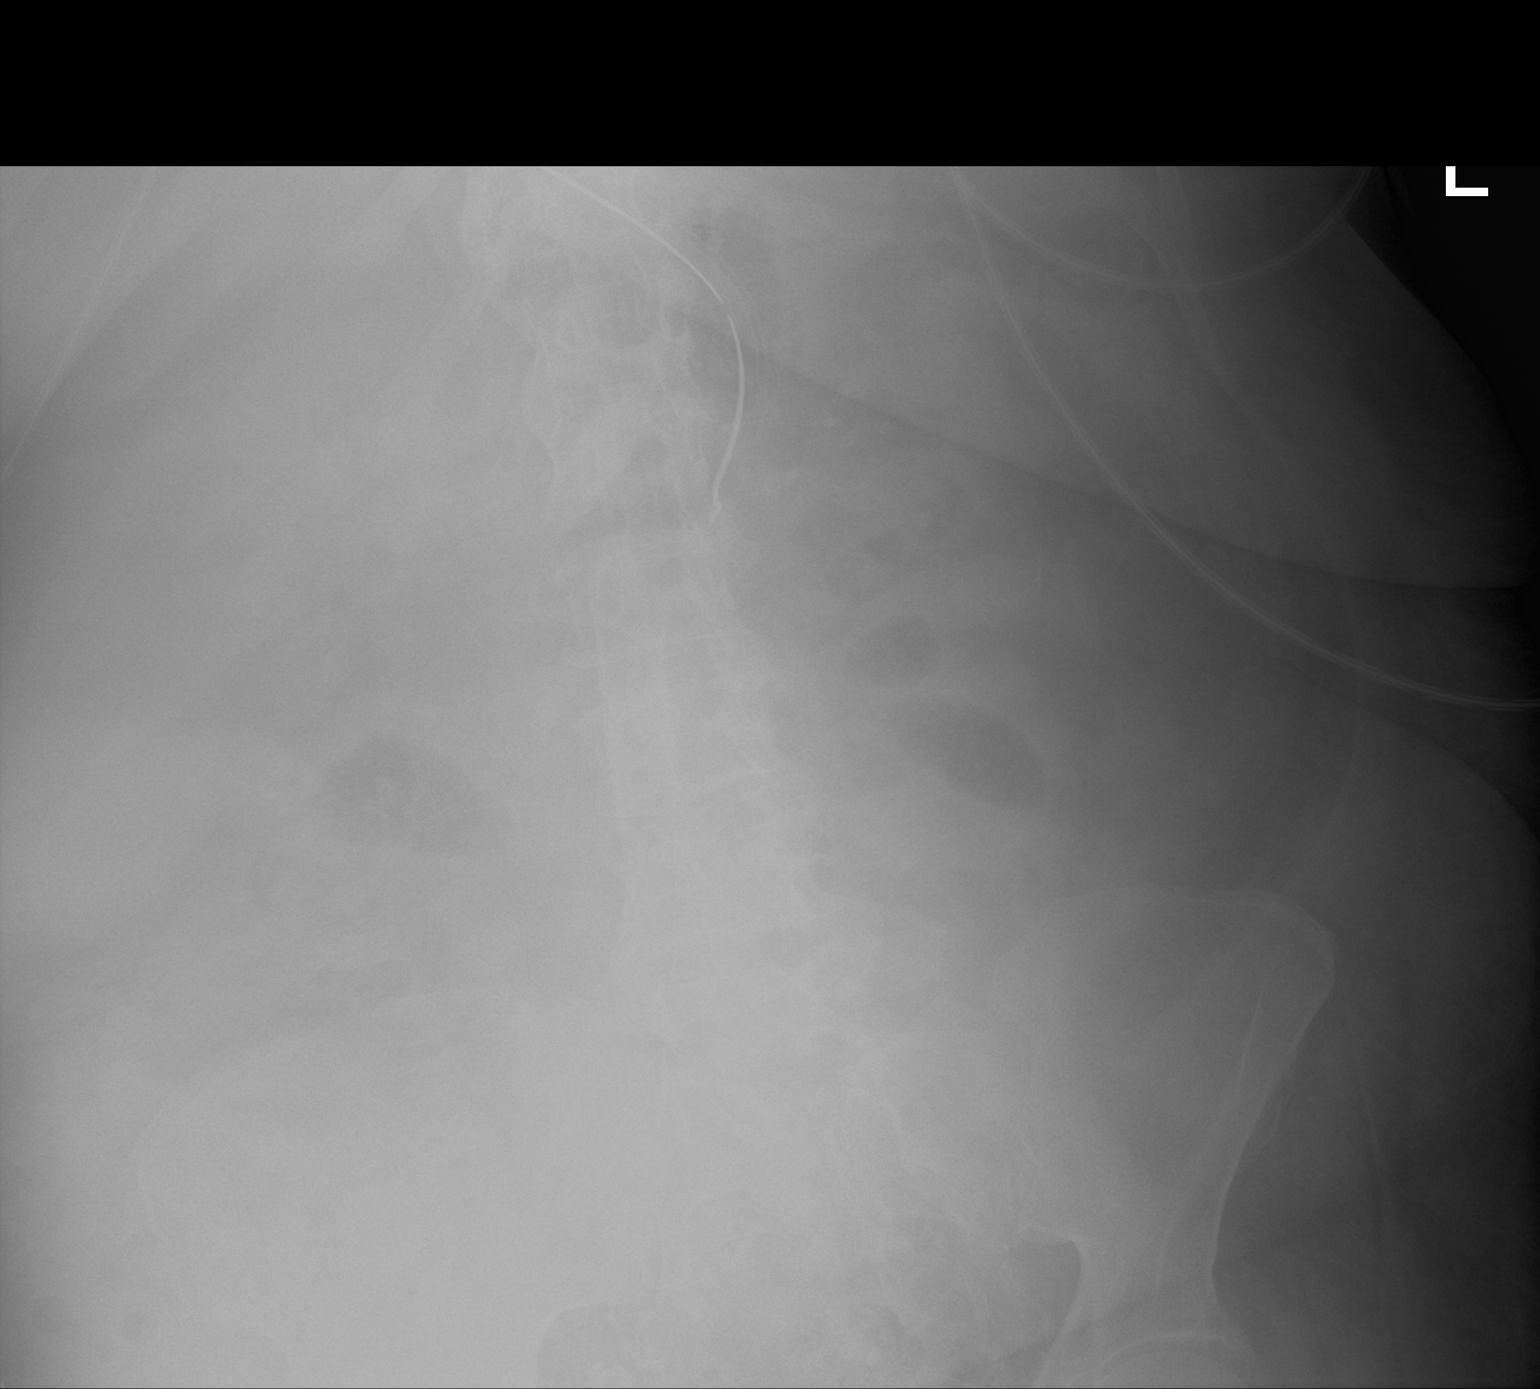

[1 of 1 positions shown; findings below may reference images not displayed]

FINDINGS: Partially visualized enteric tube with tip and side-port in the
proximal stomach. No bowel dilatation. Degenerative changes of the
spine. The osseous structures are grossly unremarkable.
Atherosclerotic calcification of the aorta.
IMPRESSION: Enteric tube with tip and side-port in the proximal stomach.

## 2021-09-30 IMAGING — DX DG CHEST 1V PORT
1 series · 1 of 1 positions shown · non-contrast
Comparison: Portable chest 02/13/2020 and earlier.

CLINICAL DATA: 59-year-old female intubated.  Respiratory failure.

EXAM:
PORTABLE CHEST 1 VIEW

[chest ap]
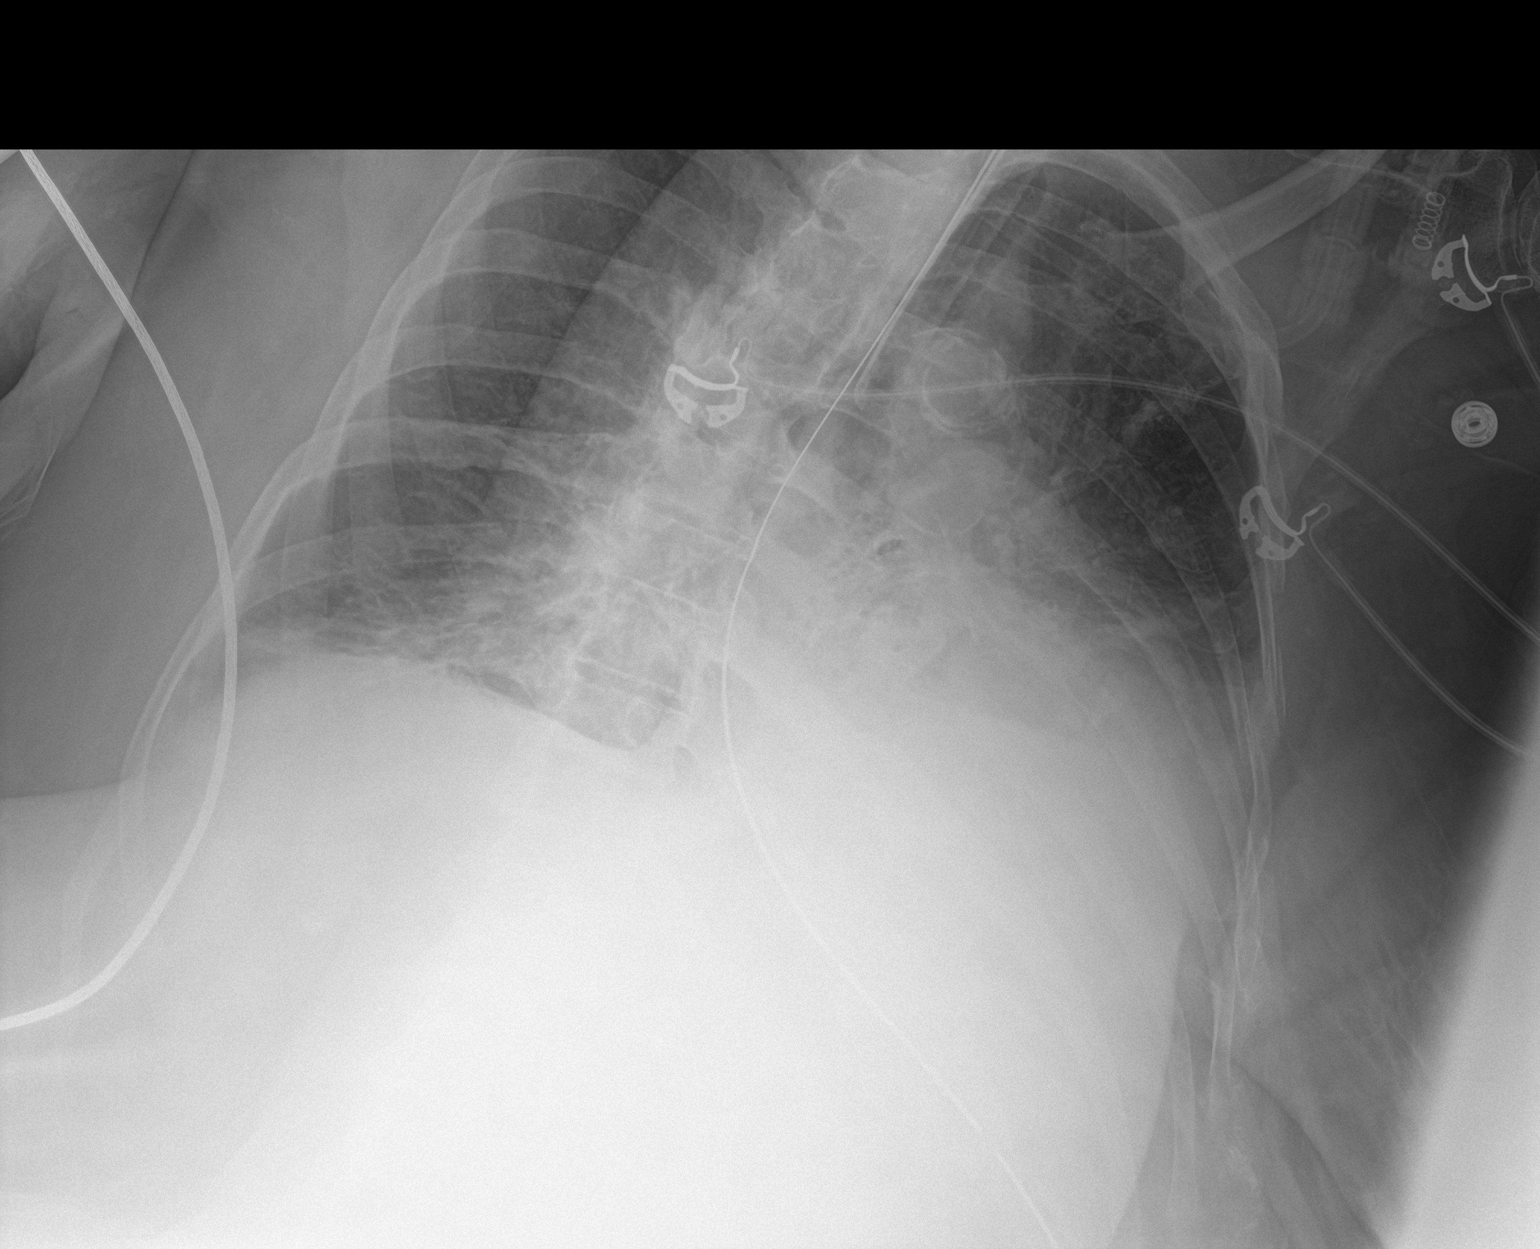

[1 of 1 positions shown; findings below may reference images not displayed]

FINDINGS: Portable AP semi upright view at 2416 hours. Patient substantially
rotated to the left today. Endotracheal tube tip about 10 mm above
the carina. Enteric tube courses to the left abdomen as before.
Grossly stable mediastinal contours. Streaky perihilar and lung base
opacity not significantly changed when allowing for the patient
rotation today. No pneumothorax identified.

Minimally displaced left lateral rib fractures are evident on this
image, and appear un healed at the left 4th and 5th rib levels.
Additional chronic rib fractures noted on CT last month.

Paucity of bowel gas in the upper abdomen.
IMPRESSION: 1. Portable exam rotated to the left. Bilateral pneumonia with
stable ventilation allowing for the degree of obliquity.
2.  Stable lines and tubes.
3. Recent minimally displaced fractures of the left 4th and 5th ribs
identified.

## 2021-10-02 IMAGING — DX DG CHEST 1V PORT
1 series · 1 of 1 positions shown · non-contrast
Comparison: Portable chest 02/16/2020 and earlier.

CLINICAL DATA: 59-year-old female intubated.  Respiratory failure.

EXAM:
PORTABLE CHEST 1 VIEW

[chest ap]
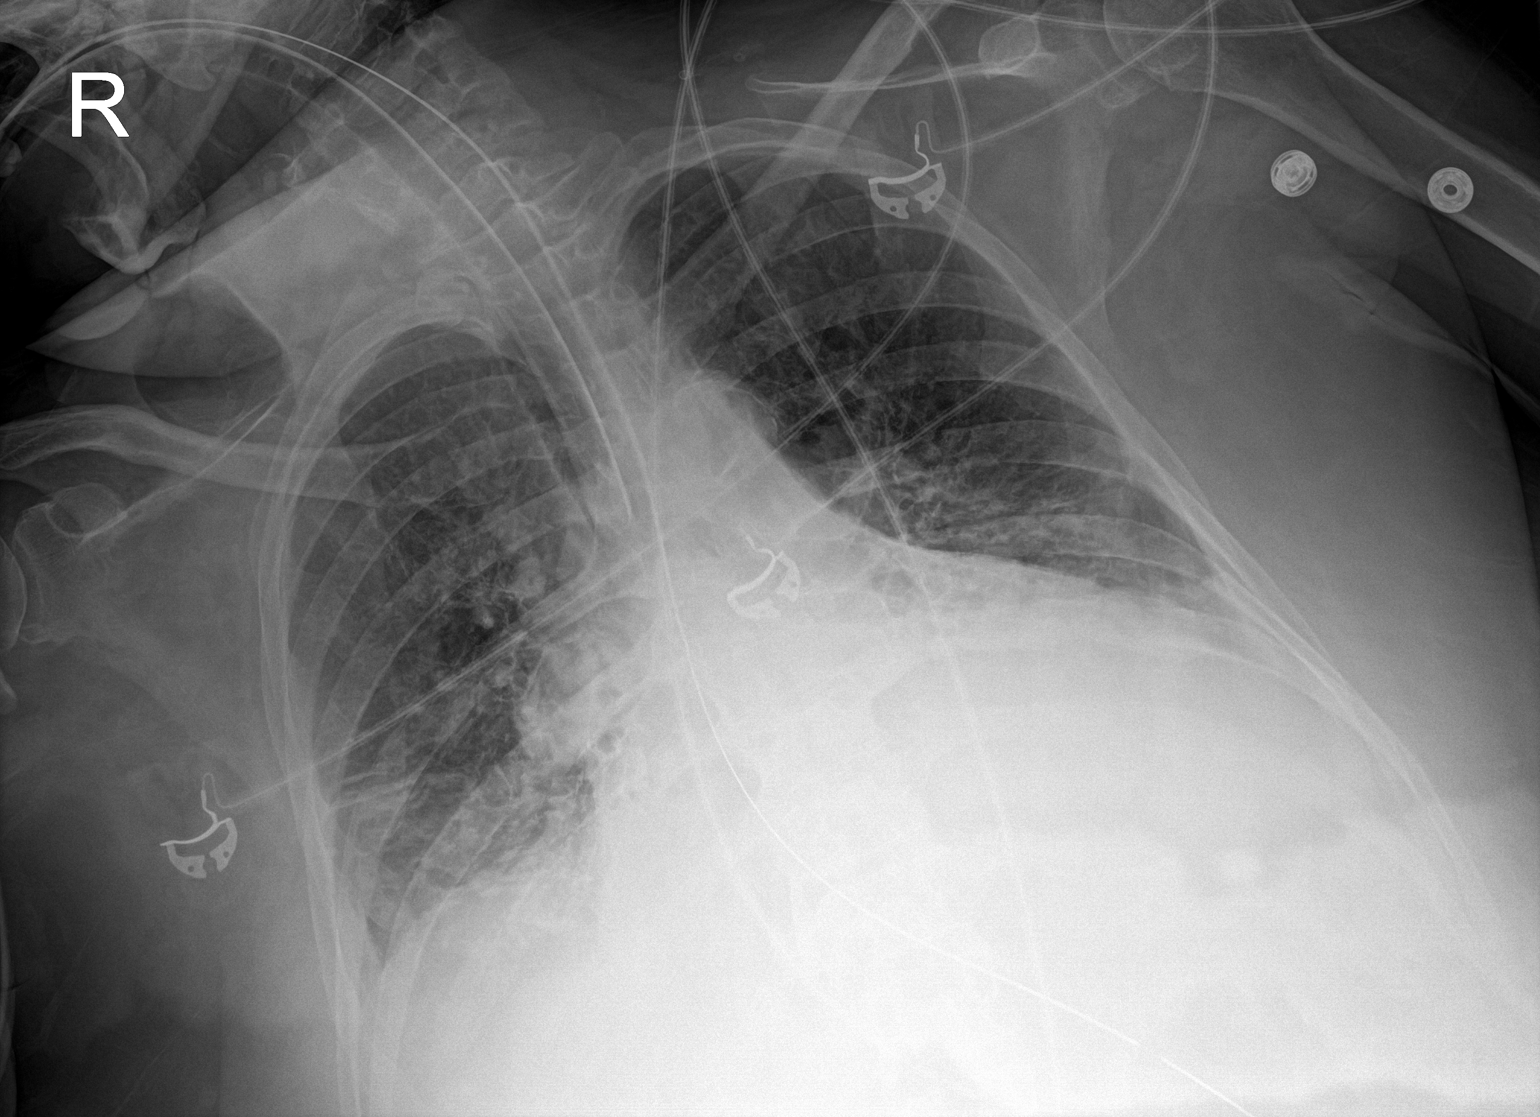

[1 of 1 positions shown; findings below may reference images not displayed]

FINDINGS: Portable AP view at 9006 hours. Stable endotracheal tube tip about 9
mm above the carina. Enteric tube courses to the stomach with side
hole at the level of the gastric body. Confluent bilateral lung base
opacity not significantly changed since 02/13/2020. Stable cardiac
size and mediastinal contours. No pneumothorax. No pulmonary edema.
Small pleural effusions difficult to exclude. Paucity of bowel gas.
No acute osseous abnormality identified.
IMPRESSION: 1.  Stable lines and tubes.
2. Bilateral lower lobe collapse or consolidation with ventilation
not significantly changed since 02/13/2020. Possible small pleural
effusions.

## 2021-10-05 IMAGING — DX DG CHEST 1V PORT
1 series · 1 of 1 positions shown · non-contrast
Comparison: Portable chest 02/18/2020 and earlier.

CLINICAL DATA: 59-year-old female intubated.  Respiratory failure.

EXAM:
PORTABLE CHEST 1 VIEW

[chest ap]
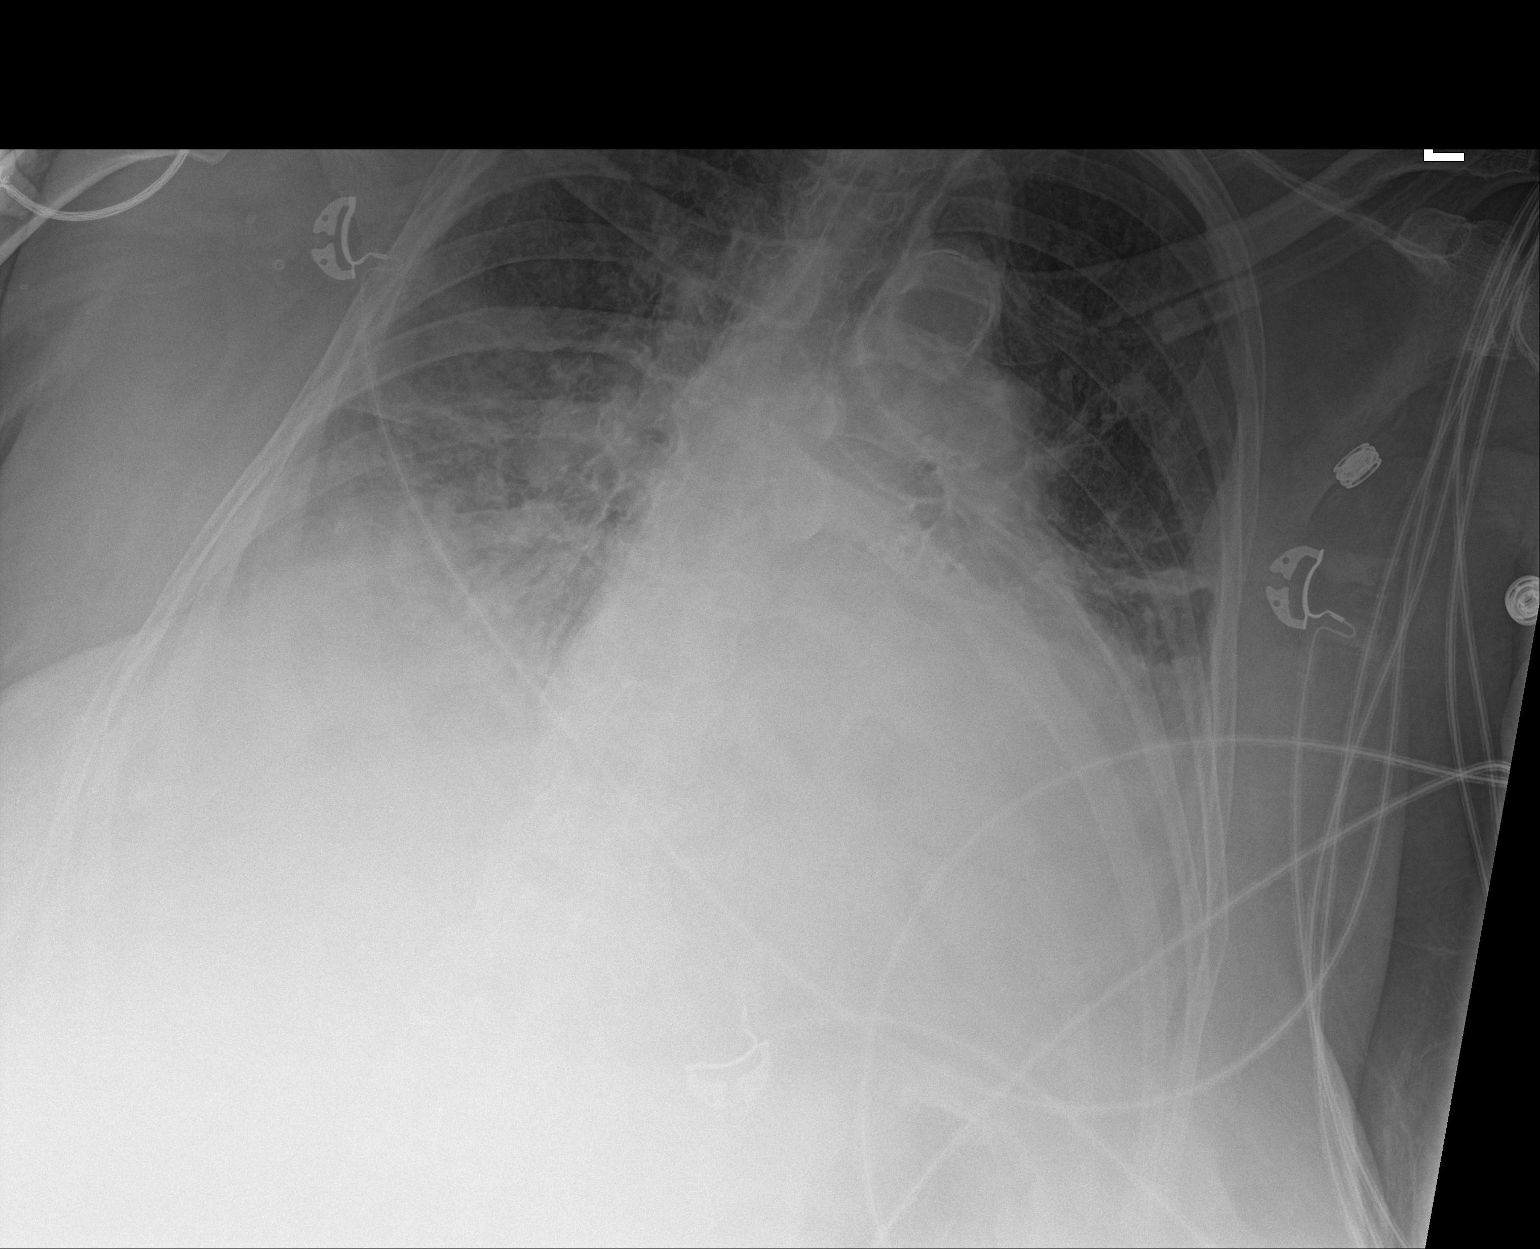

[1 of 1 positions shown; findings below may reference images not displayed]

FINDINGS: Portable AP semi upright view at 9006 hours. Extubated and enteric
tube removed. Stable cardiomegaly and mediastinal contours. Stable
to mildly improved lung volumes. Continued bibasilar veiling and
confluent pulmonary opacity. Upper lung vascularity is stable. No
pneumothorax. Paucity of bowel gas. Stable visualized osseous
structures.
IMPRESSION: 1. Extubated and enteric tube removed with stable to mildly improved
lung volumes.
2. Continued bibasilar collapse/consolidation with probable pleural
effusions.
